# Patient Record
Sex: Female | Born: 1970
Health system: Southern US, Community
[De-identification: ages and names within clinical notes are randomized; demographics above are authoritative.]

## PROBLEM LIST (undated history)

## (undated) DIAGNOSIS — F32A Depression, unspecified: Secondary | ICD-10-CM

## (undated) DIAGNOSIS — J45909 Unspecified asthma, uncomplicated: Secondary | ICD-10-CM

## (undated) DIAGNOSIS — A599 Trichomoniasis, unspecified: Secondary | ICD-10-CM

## (undated) DIAGNOSIS — H40009 Preglaucoma, unspecified, unspecified eye: Secondary | ICD-10-CM

## (undated) DIAGNOSIS — E785 Hyperlipidemia, unspecified: Secondary | ICD-10-CM

## (undated) DIAGNOSIS — M1990 Unspecified osteoarthritis, unspecified site: Secondary | ICD-10-CM

## (undated) DIAGNOSIS — F419 Anxiety disorder, unspecified: Secondary | ICD-10-CM

## (undated) DIAGNOSIS — F329 Major depressive disorder, single episode, unspecified: Secondary | ICD-10-CM

## (undated) DIAGNOSIS — T7840XA Allergy, unspecified, initial encounter: Secondary | ICD-10-CM

## (undated) DIAGNOSIS — E119 Type 2 diabetes mellitus without complications: Secondary | ICD-10-CM

## (undated) DIAGNOSIS — K219 Gastro-esophageal reflux disease without esophagitis: Secondary | ICD-10-CM

## (undated) HISTORY — DX: Major depressive disorder, single episode, unspecified: F32.9

## (undated) HISTORY — DX: Anxiety disorder, unspecified: F41.9

## (undated) HISTORY — DX: Gastro-esophageal reflux disease without esophagitis: K21.9

## (undated) HISTORY — PX: UPPER GASTROINTESTINAL ENDOSCOPY: SHX188

## (undated) HISTORY — DX: Preglaucoma, unspecified, unspecified eye: H40.009

## (undated) HISTORY — DX: Hyperlipidemia, unspecified: E78.5

## (undated) HISTORY — DX: Depression, unspecified: F32.A

## (undated) HISTORY — DX: Unspecified asthma, uncomplicated: J45.909

## (undated) HISTORY — DX: Unspecified osteoarthritis, unspecified site: M19.90

## (undated) HISTORY — DX: Allergy, unspecified, initial encounter: T78.40XA

---

## 1997-11-22 ENCOUNTER — Other Ambulatory Visit: Payer: Self-pay

## 1998-06-04 ENCOUNTER — Inpatient Hospital Stay (HOSPITAL_COMMUNITY): Admission: AD | Admit: 1998-06-04 | Discharge: 1998-06-04 | Payer: Self-pay | Admitting: Gynecology

## 1998-08-19 ENCOUNTER — Emergency Department (HOSPITAL_COMMUNITY): Admission: EM | Admit: 1998-08-19 | Discharge: 1998-08-20 | Payer: Self-pay | Admitting: Emergency Medicine

## 1999-03-03 ENCOUNTER — Inpatient Hospital Stay (HOSPITAL_COMMUNITY): Admission: AD | Admit: 1999-03-03 | Discharge: 1999-03-03 | Payer: Self-pay | Admitting: Gynecology

## 1999-05-12 ENCOUNTER — Inpatient Hospital Stay (HOSPITAL_COMMUNITY): Admission: AD | Admit: 1999-05-12 | Discharge: 1999-05-12 | Payer: Self-pay | Admitting: Gynecology

## 1999-05-13 ENCOUNTER — Inpatient Hospital Stay (HOSPITAL_COMMUNITY): Admission: AD | Admit: 1999-05-13 | Discharge: 1999-05-13 | Payer: Self-pay | Admitting: Gynecology

## 1999-08-23 ENCOUNTER — Emergency Department (HOSPITAL_COMMUNITY): Admission: EM | Admit: 1999-08-23 | Discharge: 1999-08-23 | Payer: Self-pay | Admitting: *Deleted

## 1999-10-15 ENCOUNTER — Encounter: Payer: Self-pay | Admitting: Emergency Medicine

## 1999-10-15 ENCOUNTER — Emergency Department (HOSPITAL_COMMUNITY): Admission: EM | Admit: 1999-10-15 | Discharge: 1999-10-15 | Payer: Self-pay | Admitting: Emergency Medicine

## 2000-03-09 ENCOUNTER — Inpatient Hospital Stay (HOSPITAL_COMMUNITY): Admission: AD | Admit: 2000-03-09 | Discharge: 2000-03-09 | Payer: Self-pay | Admitting: Obstetrics & Gynecology

## 2000-05-02 ENCOUNTER — Encounter: Payer: Self-pay | Admitting: Emergency Medicine

## 2000-05-02 ENCOUNTER — Emergency Department (HOSPITAL_COMMUNITY): Admission: EM | Admit: 2000-05-02 | Discharge: 2000-05-02 | Payer: Self-pay | Admitting: Emergency Medicine

## 2000-06-22 ENCOUNTER — Inpatient Hospital Stay (HOSPITAL_COMMUNITY): Admission: AD | Admit: 2000-06-22 | Discharge: 2000-06-22 | Payer: Self-pay | Admitting: Obstetrics

## 2000-09-17 ENCOUNTER — Emergency Department (HOSPITAL_COMMUNITY): Admission: EM | Admit: 2000-09-17 | Discharge: 2000-09-17 | Payer: Self-pay | Admitting: *Deleted

## 2001-07-02 ENCOUNTER — Emergency Department (HOSPITAL_COMMUNITY): Admission: EM | Admit: 2001-07-02 | Discharge: 2001-07-02 | Payer: Self-pay | Admitting: *Deleted

## 2001-07-05 ENCOUNTER — Emergency Department (HOSPITAL_COMMUNITY): Admission: EM | Admit: 2001-07-05 | Discharge: 2001-07-05 | Payer: Self-pay | Admitting: Emergency Medicine

## 2001-07-05 ENCOUNTER — Encounter: Payer: Self-pay | Admitting: Emergency Medicine

## 2001-11-08 ENCOUNTER — Emergency Department (HOSPITAL_COMMUNITY): Admission: EM | Admit: 2001-11-08 | Discharge: 2001-11-08 | Payer: Self-pay | Admitting: Emergency Medicine

## 2001-11-12 ENCOUNTER — Emergency Department (HOSPITAL_COMMUNITY): Admission: EM | Admit: 2001-11-12 | Discharge: 2001-11-12 | Payer: Self-pay | Admitting: *Deleted

## 2001-11-12 ENCOUNTER — Encounter: Payer: Self-pay | Admitting: *Deleted

## 2002-01-04 ENCOUNTER — Inpatient Hospital Stay (HOSPITAL_COMMUNITY): Admission: AD | Admit: 2002-01-04 | Discharge: 2002-01-04 | Payer: Self-pay | Admitting: Obstetrics

## 2002-01-04 ENCOUNTER — Encounter: Payer: Self-pay | Admitting: Obstetrics

## 2002-07-14 ENCOUNTER — Emergency Department (HOSPITAL_COMMUNITY): Admission: EM | Admit: 2002-07-14 | Discharge: 2002-07-14 | Payer: Self-pay | Admitting: *Deleted

## 2002-07-16 ENCOUNTER — Emergency Department (HOSPITAL_COMMUNITY): Admission: EM | Admit: 2002-07-16 | Discharge: 2002-07-16 | Payer: Self-pay | Admitting: Emergency Medicine

## 2002-07-17 ENCOUNTER — Emergency Department (HOSPITAL_COMMUNITY): Admission: EM | Admit: 2002-07-17 | Discharge: 2002-07-17 | Payer: Self-pay | Admitting: Emergency Medicine

## 2002-07-19 ENCOUNTER — Emergency Department (HOSPITAL_COMMUNITY): Admission: EM | Admit: 2002-07-19 | Discharge: 2002-07-19 | Payer: Self-pay | Admitting: Emergency Medicine

## 2002-12-11 ENCOUNTER — Inpatient Hospital Stay (HOSPITAL_COMMUNITY): Admission: AD | Admit: 2002-12-11 | Discharge: 2002-12-11 | Payer: Self-pay | Admitting: *Deleted

## 2003-03-09 ENCOUNTER — Inpatient Hospital Stay (HOSPITAL_COMMUNITY): Admission: AD | Admit: 2003-03-09 | Discharge: 2003-03-09 | Payer: Self-pay | Admitting: Obstetrics and Gynecology

## 2003-04-26 ENCOUNTER — Ambulatory Visit (HOSPITAL_COMMUNITY): Admission: RE | Admit: 2003-04-26 | Discharge: 2003-04-26 | Payer: Self-pay | Admitting: Gastroenterology

## 2003-04-27 ENCOUNTER — Ambulatory Visit (HOSPITAL_COMMUNITY): Admission: RE | Admit: 2003-04-27 | Discharge: 2003-04-27 | Payer: Self-pay | Admitting: Gastroenterology

## 2003-04-27 ENCOUNTER — Encounter: Payer: Self-pay | Admitting: Gastroenterology

## 2003-10-17 ENCOUNTER — Emergency Department (HOSPITAL_COMMUNITY): Admission: EM | Admit: 2003-10-17 | Discharge: 2003-10-18 | Payer: Self-pay | Admitting: Emergency Medicine

## 2003-10-18 ENCOUNTER — Encounter: Payer: Self-pay | Admitting: Emergency Medicine

## 2003-10-27 ENCOUNTER — Ambulatory Visit (HOSPITAL_COMMUNITY): Admission: RE | Admit: 2003-10-27 | Discharge: 2003-10-27 | Payer: Self-pay | Admitting: Gastroenterology

## 2003-11-09 ENCOUNTER — Ambulatory Visit (HOSPITAL_COMMUNITY): Admission: RE | Admit: 2003-11-09 | Discharge: 2003-11-09 | Payer: Self-pay | Admitting: Gastroenterology

## 2004-04-17 ENCOUNTER — Emergency Department (HOSPITAL_COMMUNITY): Admission: EM | Admit: 2004-04-17 | Discharge: 2004-04-17 | Payer: Self-pay | Admitting: Emergency Medicine

## 2004-08-21 ENCOUNTER — Emergency Department (HOSPITAL_COMMUNITY): Admission: EM | Admit: 2004-08-21 | Discharge: 2004-08-21 | Payer: Self-pay | Admitting: Emergency Medicine

## 2004-12-23 HISTORY — PX: VAGINAL HYSTERECTOMY: SUR661

## 2005-04-15 ENCOUNTER — Emergency Department (HOSPITAL_COMMUNITY): Admission: EM | Admit: 2005-04-15 | Discharge: 2005-04-15 | Payer: Self-pay | Admitting: Emergency Medicine

## 2005-07-04 ENCOUNTER — Inpatient Hospital Stay (HOSPITAL_COMMUNITY): Admission: AD | Admit: 2005-07-04 | Discharge: 2005-07-04 | Payer: Self-pay | Admitting: Obstetrics & Gynecology

## 2005-07-11 ENCOUNTER — Ambulatory Visit: Payer: Self-pay | Admitting: Family Medicine

## 2005-08-01 ENCOUNTER — Ambulatory Visit: Payer: Self-pay | Admitting: Obstetrics & Gynecology

## 2005-08-01 ENCOUNTER — Other Ambulatory Visit: Admission: RE | Admit: 2005-08-01 | Discharge: 2005-08-01 | Payer: Self-pay | Admitting: Obstetrics and Gynecology

## 2005-08-01 ENCOUNTER — Encounter (INDEPENDENT_AMBULATORY_CARE_PROVIDER_SITE_OTHER): Payer: Self-pay | Admitting: Specialist

## 2005-09-16 ENCOUNTER — Observation Stay (HOSPITAL_COMMUNITY): Admission: RE | Admit: 2005-09-16 | Discharge: 2005-09-18 | Payer: Self-pay | Admitting: Family Medicine

## 2005-09-16 ENCOUNTER — Ambulatory Visit: Payer: Self-pay | Admitting: Family Medicine

## 2005-09-16 ENCOUNTER — Encounter (INDEPENDENT_AMBULATORY_CARE_PROVIDER_SITE_OTHER): Payer: Self-pay | Admitting: *Deleted

## 2005-09-25 ENCOUNTER — Inpatient Hospital Stay (HOSPITAL_COMMUNITY): Admission: AD | Admit: 2005-09-25 | Discharge: 2005-09-27 | Payer: Self-pay | Admitting: Obstetrics and Gynecology

## 2005-09-25 ENCOUNTER — Ambulatory Visit: Payer: Self-pay | Admitting: Obstetrics and Gynecology

## 2005-10-04 ENCOUNTER — Ambulatory Visit: Payer: Self-pay | Admitting: Family Medicine

## 2005-10-11 ENCOUNTER — Ambulatory Visit: Payer: Self-pay | Admitting: *Deleted

## 2005-10-25 ENCOUNTER — Ambulatory Visit: Payer: Self-pay | Admitting: Family Medicine

## 2005-11-22 ENCOUNTER — Ambulatory Visit: Payer: Self-pay | Admitting: Family Medicine

## 2006-01-07 ENCOUNTER — Ambulatory Visit: Payer: Self-pay | Admitting: Obstetrics and Gynecology

## 2006-01-30 ENCOUNTER — Ambulatory Visit: Payer: Self-pay | Admitting: Family Medicine

## 2006-07-10 ENCOUNTER — Emergency Department (HOSPITAL_COMMUNITY): Admission: EM | Admit: 2006-07-10 | Discharge: 2006-07-10 | Payer: Self-pay | Admitting: Emergency Medicine

## 2006-09-10 ENCOUNTER — Emergency Department (HOSPITAL_COMMUNITY): Admission: EM | Admit: 2006-09-10 | Discharge: 2006-09-10 | Payer: Self-pay | Admitting: Emergency Medicine

## 2006-09-11 ENCOUNTER — Ambulatory Visit: Payer: Self-pay | Admitting: Obstetrics and Gynecology

## 2006-09-15 ENCOUNTER — Emergency Department (HOSPITAL_COMMUNITY): Admission: EM | Admit: 2006-09-15 | Discharge: 2006-09-15 | Payer: Self-pay | Admitting: Family Medicine

## 2006-09-25 ENCOUNTER — Ambulatory Visit (HOSPITAL_COMMUNITY): Admission: RE | Admit: 2006-09-25 | Discharge: 2006-09-25 | Payer: Self-pay | Admitting: Gastroenterology

## 2006-10-15 ENCOUNTER — Ambulatory Visit (HOSPITAL_COMMUNITY): Admission: RE | Admit: 2006-10-15 | Discharge: 2006-10-15 | Payer: Self-pay | Admitting: Gastroenterology

## 2007-01-01 ENCOUNTER — Emergency Department (HOSPITAL_COMMUNITY): Admission: EM | Admit: 2007-01-01 | Discharge: 2007-01-01 | Payer: Self-pay | Admitting: Emergency Medicine

## 2007-04-09 ENCOUNTER — Emergency Department (HOSPITAL_COMMUNITY): Admission: EM | Admit: 2007-04-09 | Discharge: 2007-04-09 | Payer: Self-pay | Admitting: Emergency Medicine

## 2007-04-15 ENCOUNTER — Ambulatory Visit: Payer: Self-pay | Admitting: Gynecology

## 2007-04-15 ENCOUNTER — Encounter (INDEPENDENT_AMBULATORY_CARE_PROVIDER_SITE_OTHER): Payer: Self-pay | Admitting: Gynecology

## 2007-04-24 ENCOUNTER — Emergency Department (HOSPITAL_COMMUNITY): Admission: EM | Admit: 2007-04-24 | Discharge: 2007-04-24 | Payer: Self-pay | Admitting: Emergency Medicine

## 2007-05-28 ENCOUNTER — Inpatient Hospital Stay (HOSPITAL_COMMUNITY): Admission: AD | Admit: 2007-05-28 | Discharge: 2007-05-28 | Payer: Self-pay | Admitting: Obstetrics and Gynecology

## 2007-06-03 ENCOUNTER — Ambulatory Visit: Payer: Self-pay | Admitting: Gynecology

## 2007-07-13 ENCOUNTER — Emergency Department (HOSPITAL_COMMUNITY): Admission: EM | Admit: 2007-07-13 | Discharge: 2007-07-13 | Payer: Self-pay | Admitting: Emergency Medicine

## 2007-08-03 ENCOUNTER — Emergency Department (HOSPITAL_COMMUNITY): Admission: EM | Admit: 2007-08-03 | Discharge: 2007-08-03 | Payer: Self-pay | Admitting: Emergency Medicine

## 2008-01-01 ENCOUNTER — Emergency Department (HOSPITAL_COMMUNITY): Admission: EM | Admit: 2008-01-01 | Discharge: 2008-01-01 | Payer: Self-pay | Admitting: Emergency Medicine

## 2008-06-08 ENCOUNTER — Emergency Department (HOSPITAL_COMMUNITY): Admission: EM | Admit: 2008-06-08 | Discharge: 2008-06-08 | Payer: Self-pay | Admitting: Emergency Medicine

## 2008-06-09 ENCOUNTER — Encounter: Payer: Self-pay | Admitting: Obstetrics & Gynecology

## 2008-06-09 ENCOUNTER — Ambulatory Visit: Payer: Self-pay | Admitting: Obstetrics & Gynecology

## 2008-06-13 ENCOUNTER — Ambulatory Visit (HOSPITAL_COMMUNITY): Admission: RE | Admit: 2008-06-13 | Discharge: 2008-06-13 | Payer: Self-pay | Admitting: Family Medicine

## 2008-07-13 ENCOUNTER — Ambulatory Visit: Payer: Self-pay | Admitting: Gynecology

## 2008-09-16 ENCOUNTER — Emergency Department (HOSPITAL_COMMUNITY): Admission: EM | Admit: 2008-09-16 | Discharge: 2008-09-16 | Payer: Self-pay | Admitting: Emergency Medicine

## 2009-06-16 ENCOUNTER — Encounter: Payer: Self-pay | Admitting: Obstetrics & Gynecology

## 2009-06-16 ENCOUNTER — Ambulatory Visit: Payer: Self-pay | Admitting: Obstetrics & Gynecology

## 2009-06-16 LAB — CONVERTED CEMR LAB: HCV Ab: NEGATIVE

## 2009-06-17 ENCOUNTER — Encounter: Payer: Self-pay | Admitting: Obstetrics & Gynecology

## 2009-06-17 LAB — CONVERTED CEMR LAB
Trich, Wet Prep: NONE SEEN
Yeast Wet Prep HPF POC: NONE SEEN

## 2009-10-05 ENCOUNTER — Emergency Department (HOSPITAL_COMMUNITY): Admission: EM | Admit: 2009-10-05 | Discharge: 2009-10-05 | Payer: Self-pay | Admitting: Emergency Medicine

## 2009-10-11 ENCOUNTER — Emergency Department (HOSPITAL_COMMUNITY): Admission: EM | Admit: 2009-10-11 | Discharge: 2009-10-11 | Payer: Self-pay | Admitting: Emergency Medicine

## 2009-10-20 ENCOUNTER — Ambulatory Visit: Payer: Self-pay | Admitting: Obstetrics and Gynecology

## 2009-11-01 ENCOUNTER — Ambulatory Visit: Payer: Self-pay | Admitting: Obstetrics and Gynecology

## 2009-11-08 ENCOUNTER — Emergency Department (HOSPITAL_COMMUNITY): Admission: EM | Admit: 2009-11-08 | Discharge: 2009-11-09 | Payer: Self-pay | Admitting: Emergency Medicine

## 2009-11-12 ENCOUNTER — Emergency Department (HOSPITAL_COMMUNITY): Admission: EM | Admit: 2009-11-12 | Discharge: 2009-11-12 | Payer: Self-pay | Admitting: Emergency Medicine

## 2009-11-17 ENCOUNTER — Encounter (HOSPITAL_BASED_OUTPATIENT_CLINIC_OR_DEPARTMENT_OTHER): Admission: RE | Admit: 2009-11-17 | Discharge: 2009-12-18 | Payer: Self-pay | Admitting: General Surgery

## 2010-04-06 ENCOUNTER — Emergency Department (HOSPITAL_COMMUNITY): Admission: EM | Admit: 2010-04-06 | Discharge: 2010-04-06 | Payer: Self-pay | Admitting: Emergency Medicine

## 2010-04-07 ENCOUNTER — Emergency Department (HOSPITAL_COMMUNITY): Admission: EM | Admit: 2010-04-07 | Discharge: 2010-04-07 | Payer: Self-pay | Admitting: Emergency Medicine

## 2010-05-01 ENCOUNTER — Ambulatory Visit (HOSPITAL_COMMUNITY): Admission: RE | Admit: 2010-05-01 | Discharge: 2010-05-01 | Payer: Self-pay | Admitting: Family Medicine

## 2010-07-15 ENCOUNTER — Emergency Department (HOSPITAL_COMMUNITY): Admission: EM | Admit: 2010-07-15 | Discharge: 2010-07-15 | Payer: Self-pay | Admitting: Emergency Medicine

## 2010-07-18 ENCOUNTER — Emergency Department (HOSPITAL_COMMUNITY): Admission: EM | Admit: 2010-07-18 | Discharge: 2010-07-18 | Payer: Self-pay | Admitting: Emergency Medicine

## 2010-08-13 ENCOUNTER — Emergency Department (HOSPITAL_COMMUNITY): Admission: EM | Admit: 2010-08-13 | Discharge: 2010-08-13 | Payer: Self-pay | Admitting: Emergency Medicine

## 2010-08-20 ENCOUNTER — Emergency Department (HOSPITAL_COMMUNITY): Admission: EM | Admit: 2010-08-20 | Discharge: 2010-08-20 | Payer: Self-pay | Admitting: Emergency Medicine

## 2010-08-23 ENCOUNTER — Emergency Department (HOSPITAL_COMMUNITY): Admission: EM | Admit: 2010-08-23 | Discharge: 2010-08-23 | Payer: Self-pay | Admitting: Emergency Medicine

## 2010-10-02 ENCOUNTER — Emergency Department (HOSPITAL_COMMUNITY): Admission: EM | Admit: 2010-10-02 | Discharge: 2010-10-02 | Payer: Self-pay | Admitting: Emergency Medicine

## 2011-02-21 ENCOUNTER — Emergency Department (HOSPITAL_COMMUNITY)
Admission: EM | Admit: 2011-02-21 | Discharge: 2011-02-21 | Disposition: A | Discharge status: Home / Self Care | Payer: Medicaid Other | Attending: Emergency Medicine | Admitting: Emergency Medicine

## 2011-02-21 DIAGNOSIS — R42 Dizziness and giddiness: Secondary | ICD-10-CM | POA: Insufficient documentation

## 2011-02-21 DIAGNOSIS — F3289 Other specified depressive episodes: Secondary | ICD-10-CM | POA: Insufficient documentation

## 2011-02-21 DIAGNOSIS — F329 Major depressive disorder, single episode, unspecified: Secondary | ICD-10-CM | POA: Insufficient documentation

## 2011-02-21 DIAGNOSIS — R079 Chest pain, unspecified: Secondary | ICD-10-CM | POA: Insufficient documentation

## 2011-02-21 DIAGNOSIS — L0231 Cutaneous abscess of buttock: Secondary | ICD-10-CM | POA: Insufficient documentation

## 2011-02-21 LAB — POCT I-STAT, CHEM 8
BUN: 5 mg/dL — ABNORMAL LOW (ref 6–23)
Calcium, Ion: 1.14 mmol/L (ref 1.12–1.32)
Chloride: 103 meq/L (ref 96–112)
Creatinine, Ser: 1 mg/dL (ref 0.4–1.2)
Glucose, Bld: 83 mg/dL (ref 70–99)
HCT: 43 % (ref 36.0–46.0)
Hemoglobin: 14.6 g/dL (ref 12.0–15.0)
Potassium: 3.5 mEq/L (ref 3.5–5.1)
Sodium: 142 mEq/L (ref 135–145)
TCO2: 26 mmol/L (ref 0–100)

## 2011-02-21 LAB — POCT CARDIAC MARKERS
CKMB, poc: <1 ng/mL — ABNORMAL LOW (ref 1.0–8.0)
Myoglobin, poc: 63.5 ng/mL (ref 12–200)
Troponin i, poc: <0.05 ng/mL (ref 0.00–0.09)

## 2011-03-01 ENCOUNTER — Emergency Department (HOSPITAL_COMMUNITY)
Admission: EM | Admit: 2011-03-01 | Discharge: 2011-03-01 | Disposition: A | Discharge status: Home / Self Care | Payer: Medicaid Other | Attending: Emergency Medicine | Admitting: Emergency Medicine

## 2011-03-01 ENCOUNTER — Emergency Department (HOSPITAL_COMMUNITY): Payer: Medicaid Other

## 2011-03-01 DIAGNOSIS — F3289 Other specified depressive episodes: Secondary | ICD-10-CM | POA: Insufficient documentation

## 2011-03-01 DIAGNOSIS — R112 Nausea with vomiting, unspecified: Secondary | ICD-10-CM | POA: Insufficient documentation

## 2011-03-01 DIAGNOSIS — F329 Major depressive disorder, single episode, unspecified: Secondary | ICD-10-CM | POA: Insufficient documentation

## 2011-03-01 DIAGNOSIS — R509 Fever, unspecified: Secondary | ICD-10-CM | POA: Insufficient documentation

## 2011-03-01 DIAGNOSIS — R197 Diarrhea, unspecified: Secondary | ICD-10-CM | POA: Insufficient documentation

## 2011-03-01 LAB — CBC
MCH: 28.6 pg (ref 26.0–34.0)
MCV: 86.6 fL (ref 78.0–100.0)
Platelets: 396 10*3/uL (ref 150–400)
RBC: 4.86 MIL/uL (ref 3.87–5.11)
RDW: 13 % (ref 11.5–15.5)

## 2011-03-01 LAB — URINE MICROSCOPIC-ADD ON

## 2011-03-01 LAB — DIFFERENTIAL
Basophils Relative: 0 % (ref 0–1)
Eosinophils Absolute: 0 10*3/uL (ref 0.0–0.7)
Monocytes Absolute: 0.4 10*3/uL (ref 0.1–1.0)
Monocytes Relative: 5 % (ref 3–12)
Neutrophils Relative %: 84 % — ABNORMAL HIGH (ref 43–77)

## 2011-03-01 LAB — URINALYSIS, ROUTINE W REFLEX MICROSCOPIC
Glucose, UA: NEGATIVE mg/dL
Leukocytes, UA: NEGATIVE
Nitrite: NEGATIVE
Protein, ur: NEGATIVE mg/dL
Urobilinogen, UA: 0.2 mg/dL (ref 0.0–1.0)

## 2011-03-01 LAB — COMPREHENSIVE METABOLIC PANEL
Albumin: 3.7 g/dL (ref 3.5–5.2)
BUN: 6 mg/dL (ref 6–23)
Chloride: 105 mEq/L (ref 96–112)
Creatinine, Ser: 0.96 mg/dL (ref 0.4–1.2)
Glucose, Bld: 98 mg/dL (ref 70–99)
Total Bilirubin: 0.7 mg/dL (ref 0.3–1.2)

## 2011-03-01 LAB — LIPASE, BLOOD: Lipase: 22 U/L (ref 11–59)

## 2011-03-07 LAB — CBC
HCT: 37.9 % (ref 36.0–46.0)
Hemoglobin: 12.8 g/dL (ref 12.0–15.0)
MCH: 29.7 pg (ref 26.0–34.0)
MCHC: 33.8 g/dL (ref 30.0–36.0)
MCV: 87.9 fL (ref 78.0–100.0)
RDW: 12.8 % (ref 11.5–15.5)

## 2011-03-07 LAB — BASIC METABOLIC PANEL
BUN: 5 mg/dL — ABNORMAL LOW (ref 6–23)
CO2: 27 mEq/L (ref 19–32)
Chloride: 107 mEq/L (ref 96–112)
Glucose, Bld: 89 mg/dL (ref 70–99)
Potassium: 3.7 mEq/L (ref 3.5–5.1)
Sodium: 141 mEq/L (ref 135–145)

## 2011-03-09 LAB — URINALYSIS, ROUTINE W REFLEX MICROSCOPIC
Bilirubin Urine: NEGATIVE
Hgb urine dipstick: NEGATIVE
Nitrite: NEGATIVE
Specific Gravity, Urine: 1.023 (ref 1.005–1.030)
Urobilinogen, UA: 1 mg/dL (ref 0.0–1.0)
pH: 7.5 (ref 5.0–8.0)

## 2011-03-09 LAB — POCT I-STAT, CHEM 8
Chloride: 104 mEq/L (ref 96–112)
Creatinine, Ser: 0.9 mg/dL (ref 0.4–1.2)
Glucose, Bld: 93 mg/dL (ref 70–99)
HCT: 43 % (ref 36.0–46.0)
Hemoglobin: 14.6 g/dL (ref 12.0–15.0)
Potassium: 3.1 mEq/L — ABNORMAL LOW (ref 3.5–5.1)
Sodium: 144 mEq/L (ref 135–145)

## 2011-03-09 LAB — URINE MICROSCOPIC-ADD ON

## 2011-03-12 LAB — CBC
HCT: 39.8 % (ref 36.0–46.0)
HCT: 41.2 % (ref 36.0–46.0)
Hemoglobin: 14 g/dL (ref 12.0–15.0)
MCHC: 33.9 g/dL (ref 30.0–36.0)
MCHC: 34.4 g/dL (ref 30.0–36.0)
MCV: 90.4 fL (ref 78.0–100.0)
MCV: 91.1 fL (ref 78.0–100.0)
Platelets: 308 K/uL (ref 150–400)
Platelets: 333 10*3/uL (ref 150–400)
RBC: 4.52 MIL/uL (ref 3.87–5.11)
RDW: 13.5 % (ref 11.5–15.5)
RDW: 13.8 % (ref 11.5–15.5)
WBC: 8.1 K/uL (ref 4.0–10.5)

## 2011-03-12 LAB — URINALYSIS, ROUTINE W REFLEX MICROSCOPIC
Glucose, UA: NEGATIVE mg/dL
Hgb urine dipstick: NEGATIVE
Ketones, ur: NEGATIVE mg/dL
Protein, ur: NEGATIVE mg/dL
Urobilinogen, UA: 0.2 mg/dL (ref 0.0–1.0)

## 2011-03-12 LAB — HEPATIC FUNCTION PANEL
ALT: 12 U/L (ref 0–35)
AST: 30 U/L (ref 0–37)
Albumin: 4 g/dL (ref 3.5–5.2)
Alkaline Phosphatase: 58 U/L (ref 39–117)
Bilirubin, Direct: 0.4 mg/dL — ABNORMAL HIGH (ref 0.0–0.3)
Indirect Bilirubin: 0.9 mg/dL (ref 0.3–0.9)
Total Bilirubin: 1.3 mg/dL — ABNORMAL HIGH (ref 0.3–1.2)
Total Protein: 7.4 g/dL (ref 6.0–8.3)

## 2011-03-12 LAB — DIFFERENTIAL
Basophils Absolute: 0 10*3/uL (ref 0.0–0.1)
Basophils Absolute: 0.1 K/uL (ref 0.0–0.1)
Basophils Relative: 1 % (ref 0–1)
Eosinophils Absolute: 0.2 K/uL (ref 0.0–0.7)
Eosinophils Relative: 2 % (ref 0–5)
Lymphocytes Relative: 25 % (ref 12–46)
Lymphocytes Relative: 38 % (ref 12–46)
Lymphs Abs: 1.5 10*3/uL (ref 0.7–4.0)
Lymphs Abs: 3 K/uL (ref 0.7–4.0)
Monocytes Absolute: 0.4 10*3/uL (ref 0.1–1.0)
Monocytes Absolute: 0.6 10*3/uL (ref 0.1–1.0)
Monocytes Relative: 7 % (ref 3–12)
Monocytes Relative: 7 % (ref 3–12)
Neutro Abs: 4.1 10*3/uL (ref 1.7–7.7)
Neutro Abs: 4.2 10*3/uL (ref 1.7–7.7)
Neutrophils Relative %: 51 % (ref 43–77)

## 2011-03-12 LAB — COMPREHENSIVE METABOLIC PANEL
AST: 21 U/L (ref 0–37)
Albumin: 4 g/dL (ref 3.5–5.2)
BUN: 8 mg/dL (ref 6–23)
Calcium: 9 mg/dL (ref 8.4–10.5)
Creatinine, Ser: 0.88 mg/dL (ref 0.4–1.2)
GFR calc Af Amer: >60 mL/min (ref >60)
Total Bilirubin: 0.7 mg/dL (ref 0.3–1.2)
Total Protein: 7.3 g/dL (ref 6.0–8.3)

## 2011-03-14 ENCOUNTER — Ambulatory Visit (INDEPENDENT_AMBULATORY_CARE_PROVIDER_SITE_OTHER): Payer: Medicaid Other | Admitting: Family

## 2011-03-14 ENCOUNTER — Encounter: Payer: Self-pay | Admitting: Physician Assistant

## 2011-03-14 DIAGNOSIS — N760 Acute vaginitis: Secondary | ICD-10-CM

## 2011-03-14 DIAGNOSIS — N951 Menopausal and female climacteric states: Secondary | ICD-10-CM

## 2011-03-14 LAB — CONVERTED CEMR LAB: Trich, Wet Prep: NONE SEEN

## 2011-03-15 NOTE — Progress Notes (Signed)
NAME:  Whitney Benton, Whitney Benton NO.:  [id number]  MEDICAL RECORD NO.:  [id number]           PATIENT TYPE:  A  LOCATION:  [facility]                   FACILITY:  [facility]  PHYSICIAN:  Maylon Cos, CNM    DATE OF BIRTH:  [birth year]  DATE OF SERVICE:  03/14/2011                                 CLINIC NOTE  REASON FOR TODAY'S VISIT:  Annual exam with Pap smear.  HISTORY OF PRESENT ILLNESS:  The patient is a 40 year old African American female, who is status post hysterectomy for problematic fibroids in 2006.  Her pathology from her TVH was benign.  She is requesting to have a mammogram, now that she is age 58, but denies any symptoms.  No pelvic pain, abnormal bleeding.  The patient's medical history is unchanged since her last visit.  Her primary care provider is Dr. Loleta Chance.  She is currently being treated for depression with Cymbalta. She has been on that medication since 2009.  The patient does complain of decreased libido and vaginal dryness.  PHYSICAL EXAMINATION:  GENERAL:  On examination, Whitney Benton is a pleasant 11- year-old [place] American female, who appears to be slightly younger than her stated age. HEENT:  Grossly normal without thyromegaly. VITAL SIGNS:  Vital signs are stable.  Her temperature is 97.2, her pulse is 67, her blood pressure is 115/80, and her weight today is 183. ABDOMEN:  Soft and nontender with no masses.  No hepatosplenomegaly. GENITALIA:  Mucous membranes are pink without lesions externally. Internal mucous membranes are pink without lesion.  She has irregular rugae with moderate tone.  Vaginal cuff is intact.  There is a scant amount of creamy malodorous discharge.  Bimanual exam reveals an absent uterus and bilateral ovaries nonpalpable and nontender. EXTREMITIES:  Lower extremities are without edema and both pedal pulses are equal.  ASSESSMENT: 1. Well-woman exam. 2. Decreased libido. 3. Questionable perimenopausal symptoms.  PLAN: 1. Wet  prep and cultures were sent to assess for infection. 2. Presumptive treatment of bacterial vaginosis, treated with 500 mg     Flagyl b.i.d. for 7 days. 3. I have discussed at length that patient's age and possible     decreasing functioning of her ovaries and relation to her     perimenopausal symptoms of vaginal dryness and decreased libido;     however, we have also discussed that she is on medication Cymbalta     that is likely to decrease sexual desire as well.  We will assess     lab work for menopause today.  Health care maintenance, routine     screening mammogram is ordered and scheduled for today.  FOLLOWUP:  The patient should follow up in 2 weeks to discuss her results from her lab work and possible need for hormone replacement.  If the patient does not appear to be in menopause and needing hormone replacement, we will consider testosterone cream for decreased libido.          ______________________________ Maylon Cos, CNM    SS/MEDQ  D:  03/14/2011  T:  03/15/2011  Job:  [id number]

## 2011-03-27 LAB — POCT URINALYSIS DIP (DEVICE)
Bilirubin Urine: NEGATIVE
Glucose, UA: NEGATIVE mg/dL
Nitrite: NEGATIVE

## 2011-03-28 LAB — POCT URINALYSIS DIP (DEVICE)
Bilirubin Urine: NEGATIVE
Glucose, UA: NEGATIVE mg/dL
Ketones, ur: NEGATIVE mg/dL

## 2011-04-03 ENCOUNTER — Ambulatory Visit (INDEPENDENT_AMBULATORY_CARE_PROVIDER_SITE_OTHER): Payer: Medicaid Other | Admitting: Obstetrics and Gynecology

## 2011-04-03 DIAGNOSIS — Z79890 Hormone replacement therapy (postmenopausal): Secondary | ICD-10-CM

## 2011-04-04 NOTE — Progress Notes (Signed)
NAME:  Whitney Benton, Whitney Benton NO.:  [id number]  MEDICAL RECORD NO.:  [id number]           PATIENT TYPE:  LOCATION:  [facility]                     FACILITY:  PHYSICIAN:  Scheryl Darter, MD       DATE OF BIRTH:  [birth year]  DATE OF SERVICE:                                 CLINIC NOTE  The patient comes for results of her lab work.  She was treated for bacterial vaginosis.  Gonorrhea and Chlamydia were negative and FSH was modestly elevated at 9.6 and an LH was 11.  She says that she has vasomotor symptoms, as well as decreased libido, and vaginal dryness. She is status post hysterectomy.  I offered hormone replacement therapy. I gave a prescription for Estratest H.S. one p.o. daily.  She will report if her symptoms do not improve.     Scheryl Darter, MD    JA/MEDQ  D:  04/03/2011  T:  04/04/2011  Job:  [id number]

## 2011-05-02 ENCOUNTER — Other Ambulatory Visit: Payer: Self-pay | Admitting: Family Medicine

## 2011-05-02 DIAGNOSIS — Z1231 Encounter for screening mammogram for malignant neoplasm of breast: Secondary | ICD-10-CM

## 2011-05-07 NOTE — Group Therapy Note (Signed)
NAME:  Whitney Benton, Whitney Benton NO.:  [id number]   MEDICAL RECORD NO.:  [id number]          PATIENT TYPE:  WOC   LOCATION[facility]  [facility]                   FACILITY:  [facility]   PHYSICIAN:  Jaynie Collins, MD     DATE OF BIRTH:  1971-10-08   DATE OF SERVICE:  06/16/2009                                  CLINIC NOTE   The patient is a 40 year old, gravida 6, para 3-0-3-3, who comes in  today for her annual examination.  The patient has no gynecologic  complaints.  She is status post total vaginal hysterectomy in 2006 and  denies any abnormal bleeding or any other concerns.   PAST OBSTETRIC AND GYNECOLOGY HISTORY:  The patient has had 3 cesarean  sections, 3 miscarriages.  She underwent total vaginal hysterectomy in  2006 for fibroids, anemia, and menometrorrhagia.  She has no history of  cervical dysplasia.  However, during her last examination, her vaginal  cuff appeared concerning and vaginal Pap smear was done which was  negative for any intraepithelial lesion or malignancy.  The patient  denies any sexually transmitted infections.  She is sexually active with  1 female partner.  She is interested in an STI screen today.   PAST MEDICAL HISTORY:  Obesity, the patient is a smoker.   PAST SURGICAL HISTORY:  Total vaginal hysterectomy.  Three cesarean  sections and a dilation and curettage.   SOCIAL HISTORY:  The patient smokes a 30-pack a day of cigarettes and  has smoked for 4 years.  She drinks 1-2 alcoholic drinks a month and  denies any illicit drugs.  She denies any physical or sexual abuse  currently.   FAMILY HISTORY:  Notable for diabetes and high blood pressure.  Negative  for breast, gynecologic, and colon malignancies.   MEDICATIONS:  1. Cyclobenzaprine 5 mg as needed.  2. DeVita as needed for headaches.   ALLERGIES:  ASPIRIN which causes hives.   REVIEW OF SYSTEMS:  Entirely negative and was mentioned in the history  of present illness.   PHYSICAL  EXAMINATION:  VITAL SIGNS:  Temperature 97, pulse 74, blood  pressure 118/72, weight 184 pounds, height 5 feet 2 inches.  GENERAL:  No apparent distress.  NECK:  Supple.  No masses.  Normal thyroid.  HEENT:  Normocephalic, atraumatic.  LUNGS:  Clear to auscultation bilaterally.  HEART:  Regular rate and rhythm.  BREASTS:  Symmetric in size, nontender, abnormal masses.  SKIN:  Skin changes or nipple drainage noted.  No lymphadenopathy.  ABDOMEN:  Obese, nontender, no abnormal masses palpated.  EXTREMITIES:  No clubbing, cyanosis, or edema.  Nontender.  PELVIC:  Normal external female genitalia.  Pink and well rugated  vagina.  Small amount of thin white discharge seen with foul odor.  A  sample was taken for wet prep.  No tenderness on examination.  BIMANUAL:  Adnexa were unable to be palpated secondary to habitus.   ASSESSMENT AND PLAN:  The patient is a 40 year old gravida 6, para 3-0-3-  3, who is here for annual exam, status post a total vaginal  hysterectomy.  The patient is doing  well.  She is interested in STI  screen, and we will send a urine sample for GC, Chlamydia, and wet prep.  We will test for Trichomonas, and she will have a serum tested for HIV,  hepatitis, and for RPR.  The patient was told to come back to the  Gynecologic Clinic for any further gynecologic concerns or in a year for  annual examination.           ______________________________  Jaynie Collins, MD     UA/MEDQ  D:  06/16/2009  T:  06/17/2009  Job:  [id number]

## 2011-05-07 NOTE — Group Therapy Note (Signed)
NAME:  [name], [name] NO.:  [id number]   MEDICAL RECORD NO.:  [id number]          PATIENT TYPE:  WOC   LOCATION:  [facility]                   FACILITY:  [facility]   PHYSICIAN:  Allie Bossier, MD        DATE OF BIRTH:  10/22/1971   DATE OF SERVICE:  06/09/2008                                  CLINIC NOTE   Whitney Benton is a 40 year old divorced African American G6, P3, A3 who has 3  teenagers who comes in for annual exam.  Her complaint today is that of  vaginal dryness for approximately a year as well as left lower quadrant  pain for approximately a month.   PAST MEDICAL HISTORY:  She is a smoker and she is overweight.   PAST SURGICAL HISTORY:  She has had a TVH, 3 cesareans and D&C.   FAMILY HISTORY:  Negative for breast, GYN, and colon malignancies.   ALLERGIES:  ASPIRIN.   SOCIAL HISTORY:  She smokes a third of a pack a day of cigarettes for  approximately 3 years.  She reports social alcohol and denies drugs.   REVIEW OF SYSTEMS:  Her last Pap smear was done in April 2008.   PHYSICAL EXAMINATION:  VITAL SIGNS:  Weight 176 pounds, height 5'2.  Blood pressure 120/87, pulse 83.  HEENT:  Normal.  HEART:  Regular rate rhythm.  BREAST:  Normal bilaterally.  ABDOMEN:  Exam is benign.  EXTERNAL GENITALIA:  Normal.  Her vaginal cuff is diffusely white with  excessive rugae.  This is not inconsistent with HPV infection.   ASSESSMENT/PLAN:  1. Annual exam.  Although ordinarily she would not need a Pap smear      after a hysterectomy, due to the abnormal appearance of her vaginal      cuff, I have done a Pap smear.  I have recommended self-breast and      self-vulvar exams monthly.  2. Vaginal dryness.  I will check an FSH.  3. Left lower quadrant pain.  I will check a GYN ultrasound.     Allie Bossier, MD    MCD/MEDQ  D:  06/09/2008  T:  06/09/2008  Job:  [id number]

## 2011-05-07 NOTE — Group Therapy Note (Signed)
NAME:  DEBI, COUSIN NO.:  [id number]   MEDICAL RECORD NO.:  [id number]          PATIENT TYPE:  WOC   LOCATION[facility]                   FACILITY:  [facility]   PHYSICIAN:  Maylon Cos, CNM    DATE OF BIRTH:  06/13/1971   DATE OF SERVICE:                                  CLINIC NOTE   The patient is being seen today July 13, 2008 in the GYN Clinic at  [facility].  She presents for a followup for lab results and pelvic  ultrasound that she had on June 22, secondary to some left-sided pelvic  pain.  She also had complained of rash underneath both of her breasts.  The patient currently has no complaints other than the rash.  She is not  experiencing any lower abdominal pain, which she states resolved  approximately 2 weeks after her visit in June.   PHYSICAL EXAMINATION:  GENERAL:  On examination today, the patient is in  no apparent distress.  VITAL SIGNS:  Stable.  Her temperature is 97.2, pulse is 71.  Her blood  pressure is 113/81.  Her weight is 175.8.  Her height is 5 feet 2  inches.   MEDICATION:  She is currently taking Cymbalta as her only medication.   ALLERGIES:  ASPIRIN.   Her exam today is problem-focused to her breast.  She has bilateral,  what appears to be a, candidal infection underneath both her left and  right breast, which is contained within the fold approximately 6 cm  underneath the posterior aspect of the breast onto her abdomen where her  breasts lay.   ASSESSMENT AND PLAN:  1. Breast candidal infection.  The patient was given a prescription      for Diflucan 300 mg p.o. x3 days and also for Nystatin powder to      use twice daily under her breasts.  She was also educated      concerning using mild soap, staying away from antibacterial soaps,      lotions in this area until completely cleared.  2. Followup for her ultrasound report.  The patient's pelvic      ultrasound on June 22, showed small ovarian cyst on her right  side      and some simple-appearing cysts which are currently benign.  The      patient also had a FSH result of 5.6 that was drawn on June 19.      This again was reviewed as being a normal value.  She does not      appear to be menopausal.  She is status post a partial      hysterectomy, still has her ovaries with continued complaints of      vaginal dryness.  For her vaginal dryness, we discussed the use of      over-the-counter Replens q. 2-3 days, but reassurance was given      that hormone levels do not indicate that she is currently      menopausal.  The patient is instructed to follow up p.r.n. for      problems.  Otherwise,  her next scheduled appointment will be after      June 18 of next year for an annual exam and physical.           ______________________________  Maylon Cos, CNM    SS/MEDQ  D:  07/13/2008  T:  07/14/2008  Job:  [id number]

## 2011-05-10 NOTE — Op Note (Signed)
   NAME:  Whitney Benton, Whitney Benton                           ACCOUNT NO.:  [id number]   MEDICAL RECORD NO.:  [id number]                   PATIENT TYPE:  AMB   LOCATION:  ENDO                                 FACILITY:  [facility]   PHYSICIAN:  John C. Madilyn Fireman, M.D.                 DATE OF BIRTH:  [birth year]   DATE OF PROCEDURE:  04/26/2003  DATE OF DISCHARGE:                                 OPERATIVE REPORT   PROCEDURE:  Esophagogastroduodenoscopy.   INDICATIONS FOR PROCEDURE:  Abdominal pain, early satiety and bloating in a  patient who has deep fears regarding cancer of the stomach due to her  husband dying at age 74 from this disease.   DESCRIPTION OF PROCEDURE:  The patient was placed in the left lateral  decubitus position then placed on the pulse monitor with continuous low flow  oxygen delivered by nasal cannula. She was sedated with 62.5 mcg IV fentanyl  and 5 mg IV Versed. The Olympus video endoscope was advanced under direct  vision into the oropharynx and esophagus. The esophagus was straight and of  normal caliber with the squamocolumnar line at 38 cm. There was no visible  hiatal hernia, ring, stricture or other abnormality of the GE junction. The  stomach was entered and a small amount of liquid secretions were suctioned  from the fundus. Retroflexed view of the cardia was unremarkable. The  fundus, body, antrum and pylorus all appeared normal. The duodenum was  entered and both the bulb and second portion were well inspected and  appeared to be within normal limits. The scope was then withdrawn back into  the stomach and a CLOtest obtained. The scope was then withdrawn and the  patient returned to the recovery room in stable condition. She tolerated the  procedure well and there were no immediate complications.   IMPRESSION:  Basically normal endoscopy.   PLAN:  Await CLOtest and treat for eradication of Helicobacter if positive  and will obtain ultrasound of the liver, gallbladder and  pancreas.                                               John C. Madilyn Fireman, M.D.    JCH/MEDQ  D:  04/26/2003  T:  04/26/2003  Job:  [id number]

## 2011-05-10 NOTE — Discharge Summary (Signed)
NAME:  Whitney Benton, Whitney Benton                 ACCOUNT NO.:  [id number]   MEDICAL RECORD NO.:  [id number]           PATIENT TYPE:   LOCATION:                                 FACILITY:   PHYSICIAN:  Phil D. Okey Dupre, M.D.          DATE OF BIRTH:   DATE OF ADMISSION:  09/25/2005  DATE OF DISCHARGE:  09/27/2005                                 DISCHARGE SUMMARY   ADMITTING DIAGNOSES:  1.  Vaginal cuff infection.  2.  Uncontrolled pain.   DISCHARGE DIAGNOSES:  Vaginal cuff infection.   PROCEDURES:  None.   ADMITTING HISTORY AND PHYSICAL:  Patient is a 40 year old African-American  female who has had a transvaginal hysterectomy nine days prior to her  presentation to the [facility] who comes in complaining of foul green discharge  from her vagina, irregular loose bowel movements, and abdominal pain.  She  says she has had a fever, although she did not take her temperature with a  thermometer.   PHYSICAL EXAMINATION:  VITAL SIGNS:  Blood pressure 129/71, temperature  98.5, pulse 95, respiratory rate 18.  ABDOMEN:  Soft with generalized tenderness, especially in the epigastric  region, but no guarding, no rebound.  PELVIC:  Her external genitalia were wet with a foul discharge.  Vaginal  mucosa was normal.  She had heavy, thick, foul lemon-colored purulent  discharge and seems to be omitting from cuff area.  Bimanual examination was  nonrevealing.   HOSPITAL COURSE:  Patient had a CT examination which suggested post surgical  changes versus abscess.  Patient was started on IV antibiotics clindamycin,  gentamicin, and ampicillin.  Patient's pain was controlled with Percocet.  Patient's pain resolved.  She still was complaining of discharge at the time  of discharge, but was much better and did have formed stool at the time of  discharge.  Laboratory:  She did have a culture which showed moderate gram-  negative rods and few gram-positive cocci in pairs with no sensitivity or  identification at the time of  discharge.   DISCHARGE INSTRUCTIONS:  Patient was discharged to home.  She was to follow  up on the 13th as scheduled.  She was discharged on Flagyl 500 mg one p.o.  t.i.d. x8 doses, no refills, Cipro 500 mg one p.o. b.i.d. x8 days with no  refills, Darvocet-N 100 one p.o. q.4-6h. p.r.n. pain #20 with no refills.  Her diet was regular.  She was to follow the same instructions she had  received before her initial discharge regarding her activity.     ______________________________  August Saucer Merlene Morse, MD    ______________________________  Javier Glazier Okey Dupre, M.D.    ABC/MEDQ  D:  09/27/2005  T:  09/27/2005  Job:  [id number]

## 2011-05-10 NOTE — Discharge Summary (Signed)
NAME:  Whitney Benton, Whitney Benton                 ACCOUNT NO.:  [id number]   MEDICAL RECORD NO.:  [id number]          PATIENT TYPE:  INP   LOCATION:  9304                          [facility]ILITY[facility]   PHYSICIAN:  Tanya S. Shawnie Pons, M.D.   DATE OF BIRTH:  08-03-1971   DATE OF ADMISSION:  09/16/2005  DATE OF DISCHARGE:  09/18/2005                                 DISCHARGE SUMMARY   FINAL DIAGNOSES:  Fibroid uterus, anemia, menometrorrhagia, pelvic pain.   PROCEDURES:  Transvaginal hysterectomy.   LABORATORIES:  Preoperative hemoglobin 10.1, postoperative hemoglobin 8.9.  Urine pregnancy test negative.   REASON FOR ADMISSION:  Briefly, patient is a 39 year old gravida 6, para 3-0-  3-3 who had fibroid uterus, pelvic pain, menometrorrhagia, and anemia who  was admitted for surgery.   HOSPITAL COURSE:  Patient was admitted, underwent the above procedure.  Postoperatively the patient was transferred to the third floor where vaginal  packing and Foley catheter were in.  On postoperative day #1 these were  removed and the patient was ambulating.  She did have ___________ that  morning and was not passing any flatus.  Her IV pain control and fluids were  discontinued on that day.  On postoperative day #2 she still had had no  flatus, but had no nausea or emesis since 09/17/05 a.m.  She remained  afebrile.  Her vital signs were stable.  Because she is tolerating a regular  diet it was felt she was stable for discharge.   DISCHARGE DISPOSITION AND CONDITION:  Patient discharged home in good  condition.   PERTINENT DISCHARGE INSTRUCTIONS:  Return with fever greater than 101 or  persistent nausea and vomiting.   DISCHARGE MEDICATIONS:  Percocet 5/325 one to two p.o. q.4-6h. p.r.n. pain  #40.   Follow-up will be in the GYN clinic in two weeks.           ______________________________  Shelbie Proctor Shawnie Pons, M.D.     TSP/MEDQ  D:  09/18/2005  T:  09/18/2005  Job:  [id number]

## 2011-05-10 NOTE — Consult Note (Signed)
NAME:  Whitney Benton, Whitney Benton NO.:  [id number]   MEDICAL RECORD NO.:  [id number]                   PATIENT TYPE:  EMS   LOCATION:  ED                                   FACILITY:  [facility]   PHYSICIAN:  Marlan Palau, M.D.               DATE OF BIRTH:  05-15-1971   DATE OF CONSULTATION:  DATE OF DISCHARGE:  07/19/2002                              NEUROLOGY CONSULTATION   HISTORY OF PRESENT ILLNESS:  The patient is a 40 year old right-handed black  female born September 09, 1971, with a history of occasional mild  headaches.  The patient generally gets her headaches around the onset of her  menstrual cycle, is able to take some Motrin which eliminates the headache.  This patient, however, began to have a bifrontal headache, being on the left  and spreading to the right, beginning a week ago in the evening hours. This  has persisted until the present time.  The patient has been through the  emergency room  on four occasions in the last seven days.  The patient  underwent a lumbar puncture approximately five days ago and has had a CT  scan of the brain, both of which were unremarkable.  Since the lumbar  puncture, this patient has developed a different type of headache,  associated with some neck stiffness, pain in the back of the head and  positional component to the headache, worse with sitting and standing,  better but not eliminated when she is lying down.  The patient denies any  numbness or weakness on the face, arms, or legs.  She does have some blurred  vision.  She denies loss of vision.  She does note some nausea, no vomiting.  This patient is brought into the emergency room  once again for headache and  claims the headache has never completely gone away at any point over the  last week.  Neurology is asked to see this patient for further evaluation.   PAST MEDICAL HISTORY:  1. History of headache as above.  2. History of cesarean section.  3. Recent  urinary tract infection, treated within the last five days.  4. History of D&C.   ALLERGIES:  The patient states an allergy to ASPIRIN which gives her hives.   SOCIAL HISTORY:  She does not smoke or drink.  The patient denies the use of  any drugs such as cocaine, marijuana, or heroin.  The patient is separated,  has three children, lives in the [place], [place], area.  She does  not work.   MEDICATIONS:  The patient has been on some Vicodin for pain, otherwise is on  no medications.  Phenergan has also been taking for some nausea.   FAMILY HISTORY:  This is notable for some problems with diabetes and  hypertension in the mother.  Medical history concerning the father is  unknown.  The patient has one  sister who is alive and well.  No family  history of headaches is noted.   REVIEW OF SYMPTOMS:  This is notable for no fevers, chills. The patient  denies shortness of breath, occasional chest pain is noted, no abdominal  pain, nausea noted.  Recent urinary tract infection.  The patient denies any  loss of consciousness.  She does note some dizziness off and on.  Headaches  currently are throbbing in nature.  Photophobia is associated with the  headache.   PHYSICAL EXAMINATION:  GENERAL APPEARANCE:  This patient is a well-developed black female who is  alert, in no apparent distress.   VITAL SIGNS:  Blood pressure is 127/85, heart rate 68, respiratory rate 18,  temperature afebrile.   HEENT:  Head is atraumatic.  Eyes:  Pupils are equal, round, and reactive to  light.  Discs are flat bilaterally.   NECK:  Supple.  No carotid bruits noted.   LUNGS:  Clear   CARDIOVASCULAR:  Regular rate and rhythm, no obvious murmurs or rubs noted.   EXTREMITIES:  No significant edema.   NEUROLOGIC:  Cranial nerves as above.  Facial symmetry is present.  The  patient has good sensation of the face to pinprick, soft touch bilaterally.  Good strength to facial muscles and the muscles  to head turning, shoulder  shrug bilaterally.  Speech is well enunciated.  Motor testing revealed 5/5  strength in all fours.  Good symmetric motor tone is noted throughout.  Sensory testing is intact to pinprick, soft touch, vibratory sensation  throughout.  The patient has good finger-nose-finger and toe-to-finger  bilaterally.  Gait was not tested.  Deep tendon reflexes are symmetric.  Toes neutral and downgoing bilaterally.   LABORATORY VALUES:  From prior evaluations include, spinal fluid with 1  white count, 0 red cells, glucose 57, protein 26.  Urinalysis reveals  specific gravity 1.029, pH 6.5, 11 to 20 white cells.   CT of the head is unremarkable.  I have reviewed the scan.   IMPRESSION:  1. Probable migraine headache complicated by post lumbar puncture headache.   This patient claims she is not sleeping well, appears to have a definite  positional component to the headache.  The patient does not appear to be in  significant pain at this point.  Will treat symptomatically at this time.  If headache is not improved in the next day or two, she is to contact our  office and we will set her up for a blood patch.  The patient may need to  keep her head down as much as possible over the next couple of days.  The  patient will follow up with [facility] if needed.                                                Marlan Palau, M.D.    CKW/MEDQ  D:  07/19/2002  T:  07/23/2002  Job:  [id number]   cc:   [address] [facility]

## 2011-05-10 NOTE — Op Note (Signed)
NAME:  Whitney Benton, Whitney Benton                           ACCOUNT NO.:  [id number]   MEDICAL RECORD NO.:  [id number]                   PATIENT TYPE:  AMB   LOCATION:  ENDO                                 FACILITY:  [facility]   PHYSICIAN:  John C. Madilyn Fireman, M.D.                 DATE OF BIRTH:  01/17/1971   DATE OF PROCEDURE:  11/09/2003  DATE OF DISCHARGE:                                 OPERATIVE REPORT   PROCEDURE:  Colonoscopy.   INDICATIONS FOR PROCEDURE:  Patient with abdominal bloating, abdominal pain  and rectal bleeding with negative upper GI, small bowel series and abdominal  ultrasound.   DESCRIPTION OF PROCEDURE:  The patient was placed in the left lateral  decubitus position then placed on the pulse monitor with continuous low flow  oxygen delivered by nasal cannula. She was sedated with 100 mcg IV fentany  and 8mg  IV Versed. The Olympus video colonoscope was inserted into the  rectum and advanced to the cecum, confirmed by transillumination at  McBurney's point and visualization of the ileocecal valve and appendiceal  orifice. The prep was excellent. The terminal ileum was intubated and  explored for several centimeters and appeared to be within normal limits.  The cecum, ascending, transverse, descending and sigmoid colon all appeared  normal with no masses, polyps, diverticula or other mucosal abnormalities.  The rectum likewise appeared normal and retroflexed view of the anus  revealed only small internal hemorrhoids. The scope was then withdrawn and  the patient returned to the recovery room in stable condition. She tolerated  the procedure well and there were no immediate complications.   IMPRESSION:  Internal hemorrhoids otherwise normal colonoscopy.   PLAN:  1. Treat hemorrhoids symptomatically.  2. Reassurance.  3. Will follow clinically and treat future symptoms symptomatically.                                               John C. Madilyn Fireman, M.D.    JCH/MEDQ  D:  11/09/2003   T:  11/10/2003  Job:  [id number]

## 2011-05-10 NOTE — Group Therapy Note (Signed)
NAME:  Whitney Benton, Whitney Benton NO.:  [id number]   MEDICAL RECORD NO.:  [id number]          PATIENT TYPE:  WOC   LOCATION:  [facility]                   FACILITY:  [facility]   PHYSICIAN:  Argentina Donovan, MD        DATE OF BIRTH:  1971/07/30   DATE OF SERVICE:                                    CLINIC NOTE   CHIEF COMPLAINT:  Vaginal spotting and lower anterior abdominal pain.   SUBJECTIVE:  The patient had a hysterectomy performed back in September of  2006.  The hysterectomy was performed for fibroids.  Has done remarkably  well since then.  Has not had any kind of vaginal bleeding or abdominal pain  since.  However, this morning the patient was in her usual state of health.  When she went to the bathroom, she noticed some spotting and some staining  on the toilet paper when she used the bathroom.  There was no blood in the  bowl.  She has also had some intermittent lower anterior abdominal pain  since this morning.  She describes it as kind of cramp like with some  dysuria, and burning when she pees.   OBJECTIVE:  VITAL SIGNS:  Note.   PHYSICAL EXAMINATION:  GENERAL:  In no apparent distress.  Alert and  oriented x3.  CARDIOVASCULAR:  Regular rate and rhythm.  No murmurs, gallops, or rubs.  PULMONARY EXAMINATION:  Clear to auscultation bilaterally.  No wheezes,  crackles, or rales.  ABDOMEN EXAMINATION:  Soft, nontender, and non-distended.  Positive bowel  sounds.  GENITOURINARY EXAMINATION:  The patient has normal, healthy appearing  external female genitalia.  There is no obvious tears or abrasions proximal  to the urethra.  She has no cyst, or abscesses on vulva or labia.  She has a  well healed cuff with no obvious signs of granulation tissue.  There are no  intervaginal lesions or abrasions that could account for bleeding or pain.  She she has no pain on bimanual exam of the vagina.  She has no adnexal  tenderness, or no tenderness at the cuff.  RECTAL EXAMINATION:   The patient has a normal rectum with no anal fissures.  She has no external hemorrhoids, and no palpable internal hemorrhoids.   ASSESSMENT AND PLAN:  Some spotting.  Not exactly sure this the source of  the spotting.  The physical exam of the vagina, cuff, external genitalia and  rectum show no obvious source of bleeding.  Giving her dysuria, we will  treat her symptomatically as a cystitis.  We will send urine and culture.  We will also send a wet prep.  We will give the patient a prescription for  Bactrim one p.o. b.i.d. for three days and Ibuprofen 600 one p.o. q.6 h.  p.r.n., and instructed her to follow-up in two weeks to assess resolution of  her symptoms.  I instructed her to call back if symptoms progressed.           ______________________________  Argentina Donovan, MD     PR/MEDQ  D:  01/07/2006  T:  01/07/2006  Job:  [id number]

## 2011-05-10 NOTE — Op Note (Signed)
NAME:  Whitney Benton, Whitney Benton                 ACCOUNT NO.:  [id number]   MEDICAL RECORD NO.:  [id number]          PATIENT TYPE:  OBV   LOCATION:  9304                          [facility]   PHYSICIAN:  Tanya S. Shawnie Pons, M.D.   DATE OF BIRTH:  11/18/1971   DATE OF PROCEDURE:  DATE OF DISCHARGE:                                 OPERATIVE REPORT   PREOPERATIVE DIAGNOSES:  1.  Fibroid uterus.  2.  Pelvic pain.  3.  Menorrhagia.  4.  Anemia.   POSTOPERATIVE DIAGNOSES:  1.  Fibroid uterus.  2.  Pelvic pain.  3.  Menorrhagia.  4.  Anemia.   OPERATION/PROCEDURE:  Transvaginal hysterectomy.   SURGEON:  Shelbie Proctor. Shawnie Pons, M.D.   ASSISTANT:  Debbrah Alar, M.D.   ANESTHESIA:  General, local.   SPECIMENS:  Uterus.   ESTIMATED BLOOD LOSS:  100 mL.   COMPLICATIONS:  None.   INDICATIONS:  Briefly, the patient is a 40 year old gravida 6, para 3-0-3-3,  who was diagnosed with a fibroid uterus and pelvic pain.  The patient  reported she was unable to sleep and could not function at work.  She had an  inability to get out of bed.  She also had heavy regular cycles with a  hemoglobin noted to be 10.1.  The patient desired permanent treatment and  was scheduled for a transvaginal hysterectomy.  The patient was counseled  regarding risks of the procedure including injury to the bladder, given that  she had three prior cesarean sections.   DESCRIPTION OF PROCEDURE:  The patient was taken to the operating room where  anesthesia was induced.  She was then prepped and draped in the usual  sterile fashion, placed in the dorsal lithotomy in Allen stirrups.  A  straight catheter was used to drain her bladder and 1 mL of methylene blue  was inserted into the bladder.  The cervix was then injected with 20 mL of  1% lidocaine with epinephrine and a circumferential incision was made around  the cervix.  Sharp dissection was used to enter the anterior peritoneal  cavity.  The bladder was densely adherent  but eventually the anterior cavity  was entered.  Posterior cavity was then entered sharply.  A long weighted  speculum was placed inside the vagina.  __________ clamp was then used to  secure the uterosacral ligaments which were cut and then suture ligated with  a Heaney-type suture of 0 Vicryl followed by free ligature.  This was  repeated on the opposite.  The uterine arteries were similarly grasped  bilaterally with Heaney clamp, cut and suture ligated in a Heaney type  fashion followed by free ties for hemostasis.  One more bite was taken on  each side in the same fashion until the uterine fundus could be delivered  posteriorly.  The cervix was then amputated.  The uterine fundus was felt to  be relatively large and a Corning method was used to decrease the volume of  the uterus.  The ovarian pedicles were then identified and grasped with  Heaney clamps and cut.  Two free ties were placed about each. These pedicles  were held.  Once the specimen was removed, the tubo-ovarian and uterosacral  ligament pedicles were looked at an found to be hemostatic.  An angle suture  was then done from the corner of the vagina into the anterior abdominal  cavity, then back out, through the posterior peritoneum, and through the  uterosacral ligament.  This was tied down with uterosacral ligament being on  the inside of this.  The posterior cuff was then run with a locked running  suture for hemostasis, incorporating the posterior peritoneum and the  vaginal cuff closed with a 0 Vicryl running stitch.  A Foley catheter was  then placed inside the bladder and clear blue urine was noted.  The vagina  was then packed with one-inch vaginal packing with Premarin cream on it.  The patient tolerated the procedure well.  All instrument, needle and lap  counts were correct x2.  The patient was awakened and taken to the recovery  room in stable condition.           ______________________________  Shelbie Proctor  Shawnie Pons, M.D.     TSP/MEDQ  D:  09/16/2005  T:  09/16/2005  Job:  [id number]

## 2011-05-10 NOTE — Group Therapy Note (Signed)
NAME:  Whitney Benton, Whitney Benton NO.:  [id number]   MEDICAL RECORD NO.:  [id number]          PATIENT TYPE:  WOC   LOCATION:  [facility]                   FACILITY:  [facility]   PHYSICIAN:  Tinnie Gens, MD        DATE OF BIRTH:  Apr 24, 1971   DATE OF SERVICE:  07/11/2005                                    CLINIC NOTE   CHIEF COMPLAINT:  Fibroid uterus.   HISTORY OF PRESENT ILLNESS:  The patient is a 40 year old gravida 6, para 3-  0-3-3 who went to the [facility]U apparently with vaginal discharge, and was  diagnosed with Trichomonas.  She was treated for that.  However, she was  also found to have a slightly enlarged uterus, but was slightly tender, and  she had lower abdominal pain as well.  The patient was sent for a pelvic  ultrasound which showed 3 fibroids, the largest of which was 3.0 cm.  The  whole uterus was 10 weeks' size.  Her endometrial stripe was approximately  15.0-16.0 mm, and felt to be thickened.  The patient has been on ibuprofen  since that time.  She continues to have pain.  She says she cannot hardly  walk, and she cannot sleep.  The patient desires hysterectomy for full  treatment.   PAST MEDICAL HISTORY:  Negative.   PAST SURGICAL HISTORY:  C section x 3, D&C x1 of the twin gestation.   MEDICATIONS:  None.   ALLERGIES:  Aspirin.   OBSTETRICAL HISTORY:  Three C sections, one emergent, 2 elective repeats,  and 3 miscarriages.   GYNECOLOGICAL HISTORY:  Menarche at age 36.  Cycles are every 26-28 days  with heavy-to-medium menstrual flow.  The problem is pain associated with  her periods.  The patient is status post tubal ligation.   FAMILY HISTORY:  Significant for diabetes and hypertension.   SOCIAL HISTORY:  She does smoke about a 1/3 of a pack a day.  She works as a  Advertising copywriter.  No alcohol or drug use.   REVIEW OF SYSTEMS:  A 14-point review of systems was reviewed, and is  negative except for some occasional nausea and vomiting, painful  intercourse  and frequent headaches.   PHYSICAL EXAMINATION:  VITAL SIGNS:  Her vitals are as noted on the chart.  GENERAL:  This is a well-developed, well-nourished, black female in no acute  distress.  The patient is on her period so her pelvic and abdomen exam is  deferred today.   IMPRESSION:  Fibroid uterus with severe pelvic pain with menstrual cycles as  well as intermittent menstrual pain and dyspareunia likely related to  fibroid uterus, although it may not be.   PLAN:  1.  Schedule patient for endometrial biopsy and Pap smear.  2.  We will go ahead and put her on the schedule for a vaginal hysterectomy.      I did advise her about the risks of this procedure including bladder      injury given that she has 3 prior C-sections.  The patient understood      all of these  risks, but was willing to proceed with hysterectomy, and      she is not felt to be a good candidate for other treatments secondary to      inadequate treatment alternatives for fibroid uterus.       TP/MEDQ  D:  07/11/2005  T:  07/11/2005  Job:  [id number]

## 2011-05-14 ENCOUNTER — Ambulatory Visit (HOSPITAL_COMMUNITY)
Admission: RE | Admit: 2011-05-14 | Discharge: 2011-05-14 | Disposition: A | Discharge status: Home / Self Care | Payer: Medicaid Other | Source: Ambulatory Visit | Attending: Family Medicine | Admitting: Family Medicine

## 2011-05-14 DIAGNOSIS — Z1231 Encounter for screening mammogram for malignant neoplasm of breast: Secondary | ICD-10-CM

## 2011-05-15 ENCOUNTER — Other Ambulatory Visit: Payer: Self-pay | Admitting: Family Medicine

## 2011-05-15 DIAGNOSIS — N63 Unspecified lump in unspecified breast: Secondary | ICD-10-CM

## 2011-05-16 ENCOUNTER — Other Ambulatory Visit: Payer: Self-pay | Admitting: Family Medicine

## 2011-05-17 ENCOUNTER — Ambulatory Visit: Payer: Medicaid Other | Admitting: Obstetrics and Gynecology

## 2011-05-17 ENCOUNTER — Other Ambulatory Visit: Payer: Self-pay | Admitting: Family Medicine

## 2011-05-17 DIAGNOSIS — N63 Unspecified lump in unspecified breast: Secondary | ICD-10-CM

## 2011-05-24 ENCOUNTER — Ambulatory Visit
Admission: RE | Admit: 2011-05-24 | Discharge: 2011-05-24 | Disposition: A | Discharge status: Home / Self Care | Payer: Medicaid Other | Source: Ambulatory Visit | Attending: Family Medicine | Admitting: Family Medicine

## 2011-05-24 DIAGNOSIS — N63 Unspecified lump in unspecified breast: Secondary | ICD-10-CM

## 2011-05-27 ENCOUNTER — Ambulatory Visit (INDEPENDENT_AMBULATORY_CARE_PROVIDER_SITE_OTHER): Payer: Medicaid Other | Admitting: Family Medicine

## 2011-05-27 DIAGNOSIS — N951 Menopausal and female climacteric states: Secondary | ICD-10-CM

## 2011-05-28 NOTE — Group Therapy Note (Signed)
NAME:  Whitney Benton, Whitney Benton NO.:  [id number]  MEDICAL RECORD NO.:  [id number]           PATIENT TYPE:  A  LOCATION:  [facility]                   FACILITY:  [facility]  PHYSICIAN:  Lucina Mellow, DO   DATE OF BIRTH:  Jul 05, 1971  DATE OF SERVICE:  05/27/2011                                 CLINIC NOTE  The patient is a 40 year old female who had a hysterectomy by Dr. Shawnie Pons in 2006.  She presented to the clinic in April 2012, and saw Dr. Debroah Loop for some postmenopausal symptoms.  She did not have her ovaries removed. She was offered hormone replacement therapy and was given a prescription for Estratest H.S. daily.  The patient presents today for followup on that new medication.  The patient states that the medication did help with some of her hot flashes, but she still does not like the way it makes her feel and in general most of it is that she still does not have any sexual drive and has quite a bit of vaginal dryness, so when she does have sex it is very uncomfortable and it hurts.  She reports that she would like something to help with her vaginal dryness because that seems to be her biggest problem at this time.  The patient reports that she has never tried any prescription vaginal medications or estrogen replacement.  She has tried some of the over-the-counter lubricants without any relief of her symptoms.  PHYSICAL EXAMINATION:  VITAL SIGNS:  The patient's blood pressure is 115/77, temperature 98.9, pulse 64, weight 186.3, height of 62 inches. GENERAL:  The patient is a pleasant African American female who looks her stated age of 62. HEART:  Regular rate and rhythm. LUNGS:  Clear to auscultation bilaterally.  ASSESSMENT:  Menopausal symptoms to include vasomotor symptoms and vaginal dryness, but not limited.  We discussed her options and the patient decided she would like to do a trial of vaginal Premarin.  The prescription provided for her today is Premarin 0.625  mg/g and she is to place one full applicator per vagina each night for 2 weeks and then to one half applicator for twice a week.  The patient is instructed that she should do this after having any intercourse and not before and this may also control with her hot flashes, since it is systemic.  It can be systemically absorbed.  The patient is recommend that she continue all of her primary care as far as her blood pressure control and her mammograms.  The patient is also recommended.  We will follow closely with her primary care provider for any other medical issues in that with an estrogen at increased risk for clots.  The patient voices her understanding and agrees with this plan.  She will follow up in approximately 1-2 months to discuss the treatment.  If the treatment is not helping her, in fact making things worse, she can discontinue the medication and give [place] a call and make an earlier appointment.          ______________________________ Lucina Mellow, DO    SH/MEDQ  D:  05/27/2011  T:  05/28/2011  Job:  [id number]

## 2011-09-05 ENCOUNTER — Ambulatory Visit: Payer: Medicaid Other | Admitting: Obstetrics and Gynecology

## 2011-09-06 ENCOUNTER — Ambulatory Visit (INDEPENDENT_AMBULATORY_CARE_PROVIDER_SITE_OTHER): Payer: Medicaid Other | Admitting: Advanced Practice Midwife

## 2011-09-06 ENCOUNTER — Encounter: Payer: Self-pay | Admitting: Advanced Practice Midwife

## 2011-09-06 VITALS — BP 110/72 | HR 72 | Temp 97.4°F | Ht 63.0 in | Wt 186.0 lb

## 2011-09-06 DIAGNOSIS — D259 Leiomyoma of uterus, unspecified: Secondary | ICD-10-CM

## 2011-09-06 DIAGNOSIS — Z90710 Acquired absence of both cervix and uterus: Secondary | ICD-10-CM

## 2011-09-06 DIAGNOSIS — N898 Other specified noninflammatory disorders of vagina: Secondary | ICD-10-CM

## 2011-09-06 NOTE — Progress Notes (Signed)
Complains of vaginal discharge- vaginal itching and irritation off and on x 1 week with bad vaginal odor, also c/o a bump on top on right labia that causes pain if it is touched or bumped

## 2011-09-06 NOTE — Progress Notes (Signed)
  Subjective:    Patient ID: Whitney Benton, female    DOB: Oct 08, 1971, 40 y.o.   MRN: [id number]  HPI: [facility] increased malodorous vaginal discharge and a lump on her mons x a few days. She states she is in a mutually monogamous relationship. She denies lesions. She is S/P hysterectomy for fibroids and has no Hx of abn Paps.    Review of Systems Otherwise neg    Objective:   Physical Exam BUS: 2x2 cm tender lump right of midline on mons.  Pelvic: scant amount of thin, white, malodorous discharge. No lesions.      Assessment & Plan:  Assessment: 1. Possible BV 2. Foliculitis  Plan: 1. Wet Prep, GC/CT. Will call w/ results 2. Frequent warm soaks and compresses. F/U PRN for worsening of foliculitis

## 2011-09-07 LAB — GC/CHLAMYDIA PROBE AMP, GENITAL: Chlamydia, DNA Probe: NEGATIVE

## 2011-09-07 LAB — WET PREP, GENITAL
Trich, Wet Prep: NONE SEEN
Yeast Wet Prep HPF POC: NONE SEEN

## 2011-09-08 DIAGNOSIS — Z90710 Acquired absence of both cervix and uterus: Secondary | ICD-10-CM | POA: Insufficient documentation

## 2011-09-08 DIAGNOSIS — D259 Leiomyoma of uterus, unspecified: Secondary | ICD-10-CM | POA: Insufficient documentation

## 2011-09-11 LAB — CBC
Hemoglobin: 13
RBC: 4.27
WBC: 7.9

## 2011-09-11 LAB — COMPREHENSIVE METABOLIC PANEL
ALT: 11
AST: 16
Alkaline Phosphatase: 56
CO2: 29
GFR calc Af Amer: >60
GFR calc non Af Amer: >60
Glucose, Bld: 97
Potassium: 3.3 — ABNORMAL LOW
Sodium: 142

## 2011-09-11 LAB — DIFFERENTIAL
Basophils Relative: 1
Eosinophils Absolute: 0.1
Eosinophils Relative: 2
Monocytes Relative: 7
Neutrophils Relative %: 55

## 2011-09-11 LAB — POCT CARDIAC MARKERS
Operator id: 4295
Troponin i, poc: <0.05

## 2011-09-11 LAB — D-DIMER, QUANTITATIVE: D-Dimer, Quant: <0.22

## 2011-09-19 LAB — HEPATIC FUNCTION PANEL
ALT: 8
AST: 14
Alkaline Phosphatase: 59
Bilirubin, Direct: 0.1
Indirect Bilirubin: 0.6
Total Bilirubin: 0.7

## 2011-09-19 LAB — BASIC METABOLIC PANEL
CO2: 28
Chloride: 102
Creatinine, Ser: 0.79
GFR calc Af Amer: >60
Potassium: 3.4 — ABNORMAL LOW

## 2011-09-19 LAB — CBC
HCT: 41.4
Hemoglobin: 13.7
MCHC: 33.2
MCV: 91
RBC: 4.55
WBC: 10.3

## 2011-09-19 LAB — DIFFERENTIAL
Basophils Relative: 0
Eosinophils Relative: 3
Lymphs Abs: 3.4
Monocytes Absolute: 0.6
Monocytes Relative: 6
Neutro Abs: 6

## 2011-09-19 LAB — POCT CARDIAC MARKERS: Myoglobin, poc: 30

## 2011-09-19 LAB — D-DIMER, QUANTITATIVE: D-Dimer, Quant: <0.22

## 2011-09-23 LAB — DIFFERENTIAL
Basophils Relative: 0
Eosinophils Relative: 3
Lymphocytes Relative: 39
Monocytes Absolute: 0.3
Monocytes Relative: 4
Neutro Abs: 3.8

## 2011-09-23 LAB — CBC
HCT: 38.9
Hemoglobin: 13.2
MCHC: 33.9
RBC: 4.33

## 2011-09-23 LAB — BASIC METABOLIC PANEL
CO2: 31
Calcium: 9
GFR calc Af Amer: >60
Glucose, Bld: 102 — ABNORMAL HIGH
Potassium: 3.1 — ABNORMAL LOW
Sodium: 140

## 2011-09-23 LAB — POCT CARDIAC MARKERS: Troponin i, poc: <0.05

## 2011-10-07 ENCOUNTER — Telehealth: Payer: Self-pay | Admitting: *Deleted

## 2011-10-07 LAB — URINALYSIS, ROUTINE W REFLEX MICROSCOPIC
Nitrite: POSITIVE — AB
Specific Gravity, Urine: 1.027
Urobilinogen, UA: 1
pH: 6.5

## 2011-10-07 LAB — PREGNANCY, URINE: Preg Test, Ur: NEGATIVE

## 2011-10-07 LAB — URINE MICROSCOPIC-ADD ON

## 2011-10-07 NOTE — Telephone Encounter (Signed)
Pt requests results. I called pt and informed her of the following results: GC/Chlamydia- neg; wet prep- neg. Pt states she is still having a fishy odor from the vagina- the same as when she was seen @ clinic. I advised pt to try OTC Rephresh per package instructions. If she continues to have a problem, she should call back for re-exam. Pt voiced understanding,

## 2011-10-10 LAB — URINALYSIS, ROUTINE W REFLEX MICROSCOPIC
Glucose, UA: NEGATIVE
Protein, ur: NEGATIVE
Specific Gravity, Urine: 1.025
pH: 6

## 2011-10-10 LAB — URINE MICROSCOPIC-ADD ON

## 2011-10-10 LAB — WET PREP, GENITAL

## 2011-10-16 IMAGING — RF DG ESOPHAGUS
6 of 9 series · 15 of 24 positions shown · non-contrast
Comparison: 06/08/2008

CLINICAL DATA: Dysphagia.  Reflux.

ESOPHOGRAM/BARIUM SWALLOW
TECHNIQUE: Combined double contrast and single contrast
examination performed using effervescent crystals, thick barium
liquid, and thin barium liquid.
Fluoroscopy time:  1.2 minutes.

[Series 1: run · 4 of 15 slices shown (1 of 6)]
[im 1/15]
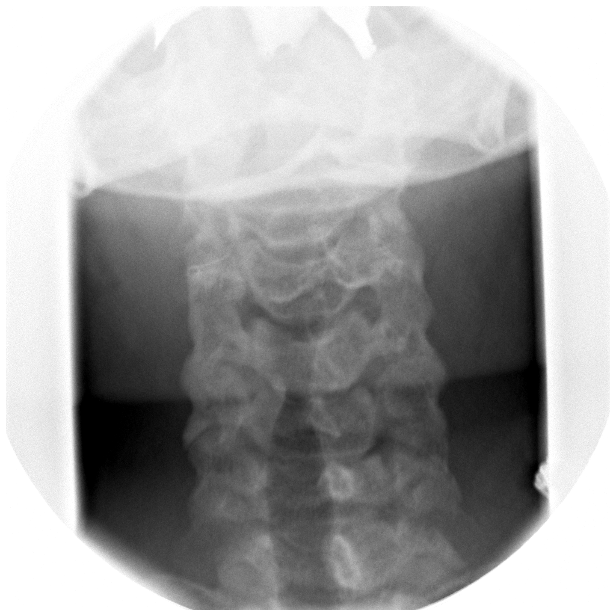
[im 5/15]
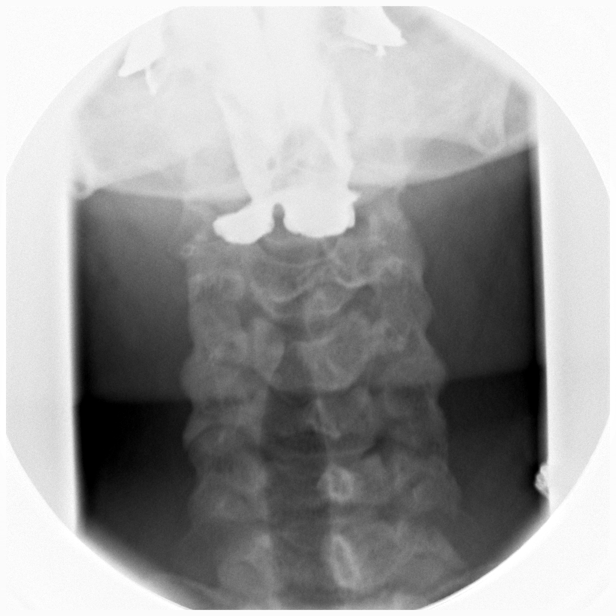
[im 10/15]
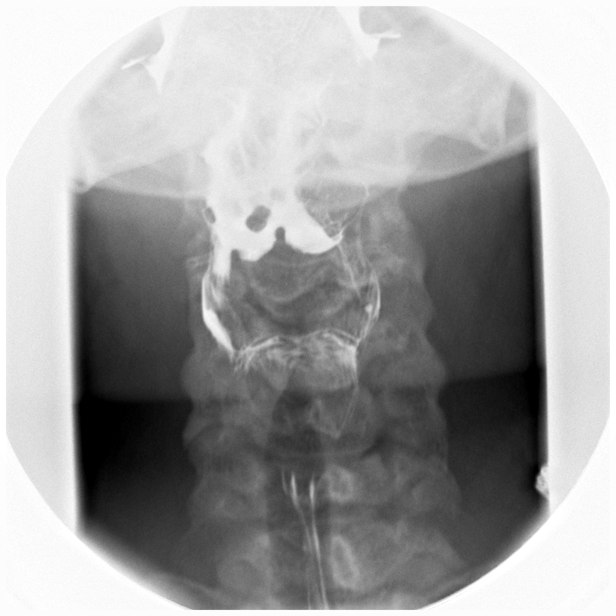
[im 12/15]
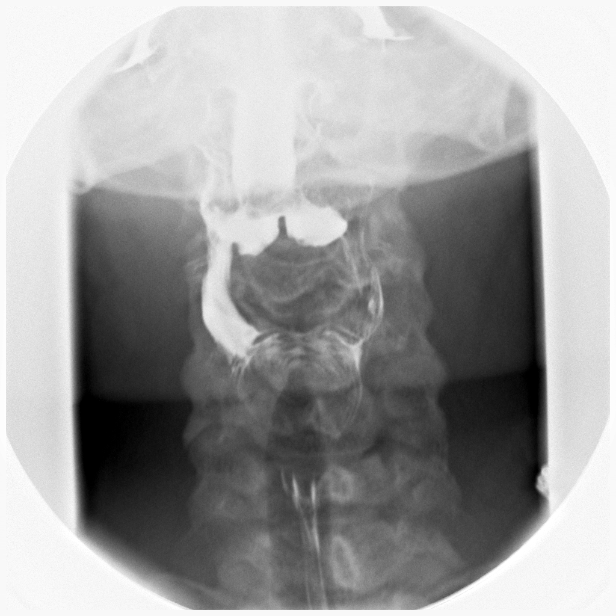

[Series 2: run · 7 of 18 slices shown (2 of 6)]
[im 1/18]
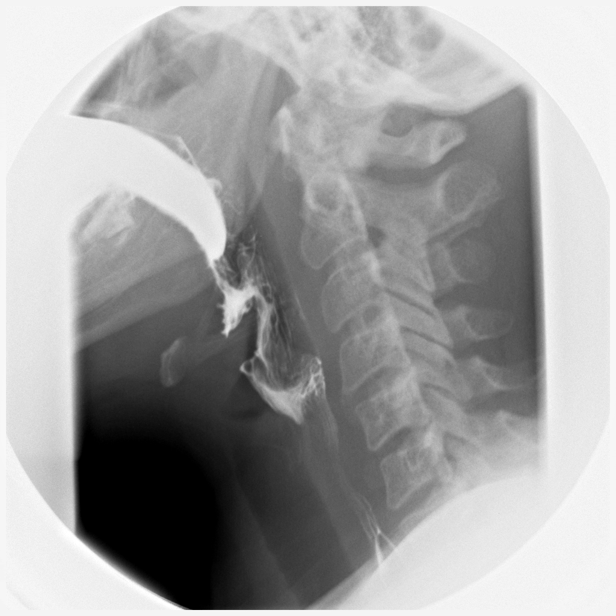
[im 2/18]
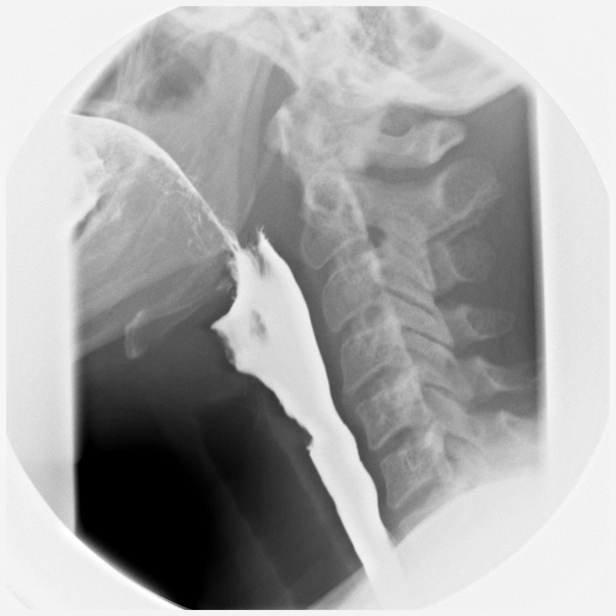
[im 6/18]
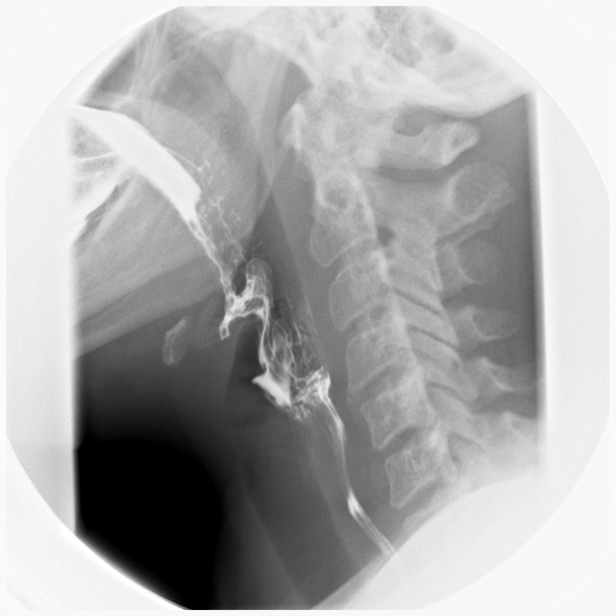
[im 10/18]
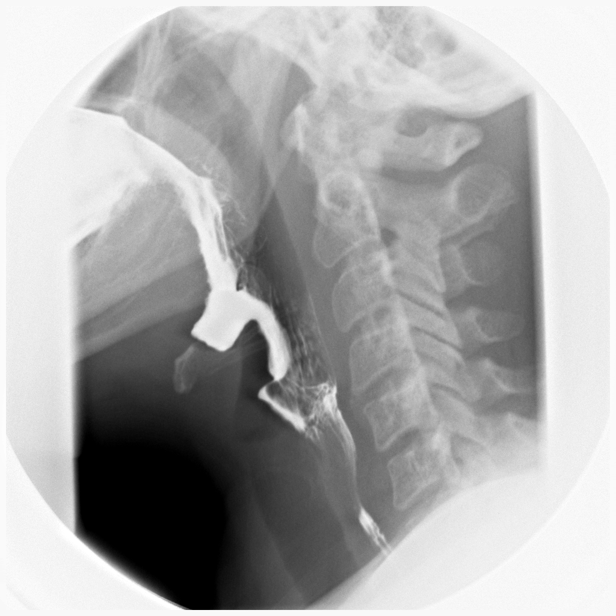
[im 12/18]
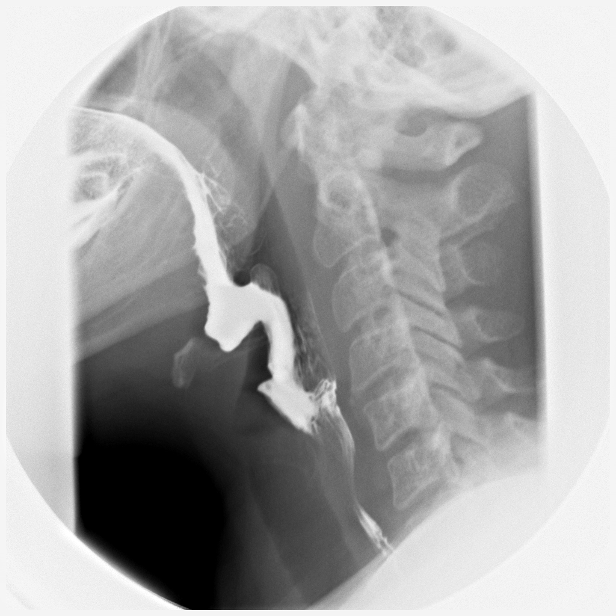
[im 16/18]
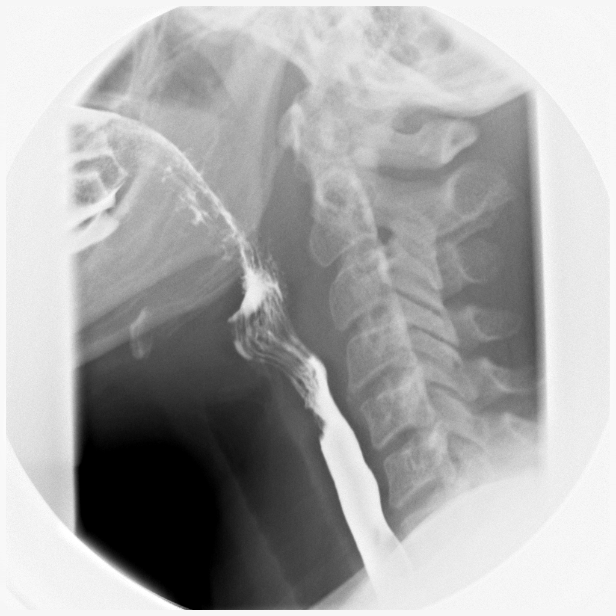
[im 18/18]
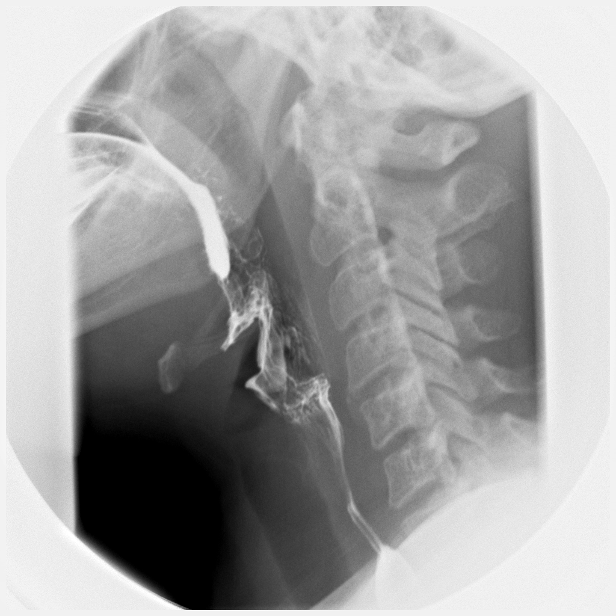

[Series 4: run · 1 of 1 slices shown (3 of 6)]
[im 1/1]
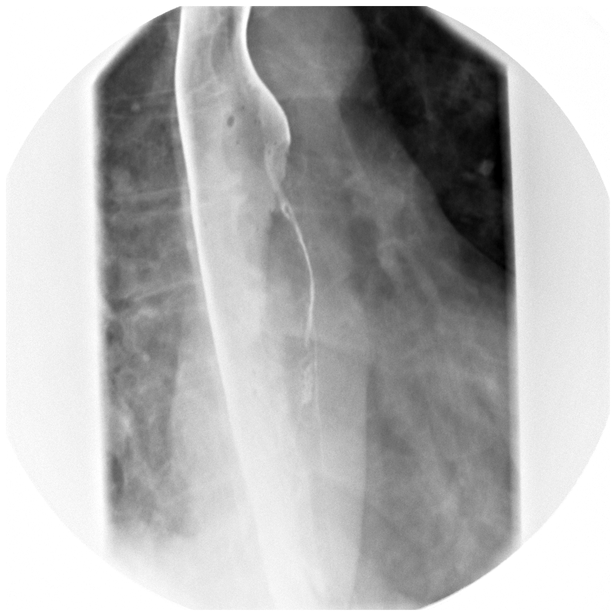

[Series 6: run · 1 of 1 slices shown (4 of 6)]
[im 1/1]
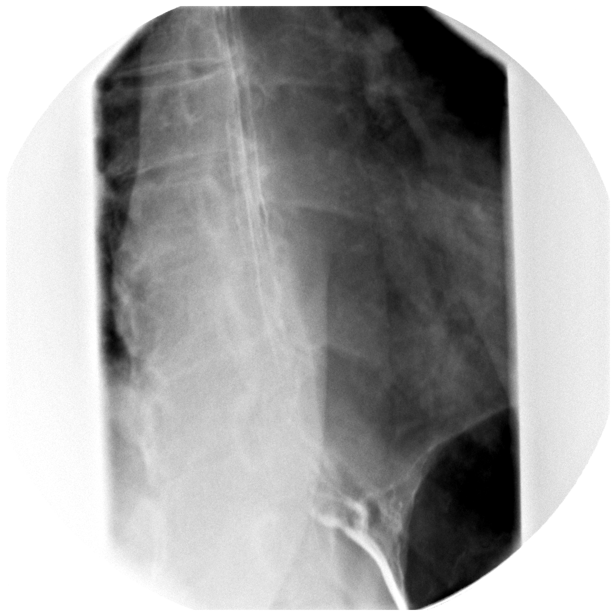

[Series 7: run · 1 of 1 slices shown (5 of 6)]
[im 1/1]
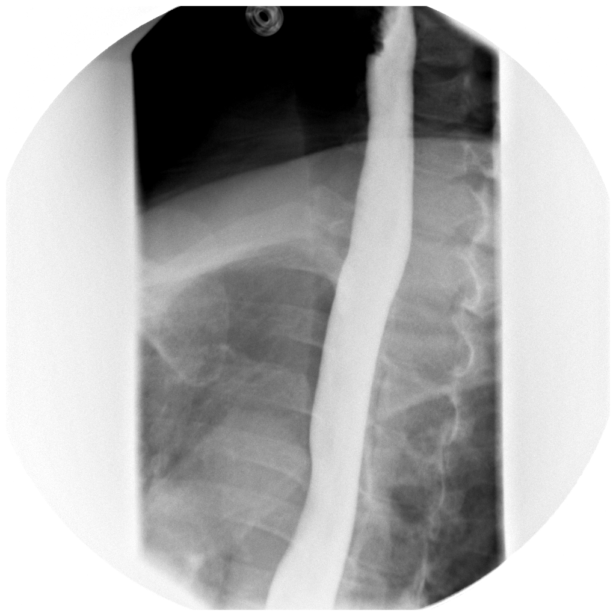

[Series 9: run · 1 of 1 slices shown (6 of 6)]
[im 1/1]
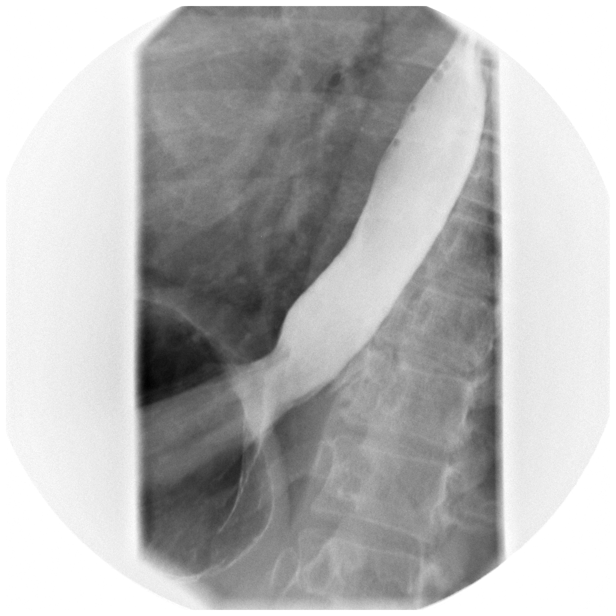

[15 of 24 positions shown; findings below may reference images not displayed]

FINDINGS: The pharyngeal phase of swallowing appears normal.

Double contrast images of the esophagram as and mucosal relief view
of the distal esophagus appear normal.

A single swallow of fluoroscopic observation reveals a normal
esophageal motility.  Full column thin barium assessment of the
esophagus demonstrates no stricture or other significant
abnormality.

A 13 mm barium tablet passed without difficulty into the stomach.
IMPRESSION: 1.  Normal esophagram.

## 2011-11-20 ENCOUNTER — Encounter (HOSPITAL_COMMUNITY): Payer: Self-pay | Admitting: *Deleted

## 2011-11-20 ENCOUNTER — Emergency Department (HOSPITAL_COMMUNITY)
Admission: EM | Admit: 2011-11-20 | Discharge: 2011-11-20 | Disposition: A | Discharge status: Home / Self Care | Payer: Medicaid Other | Attending: Emergency Medicine | Admitting: Emergency Medicine

## 2011-11-20 DIAGNOSIS — F329 Major depressive disorder, single episode, unspecified: Secondary | ICD-10-CM | POA: Insufficient documentation

## 2011-11-20 DIAGNOSIS — L2989 Other pruritus: Secondary | ICD-10-CM | POA: Insufficient documentation

## 2011-11-20 DIAGNOSIS — L298 Other pruritus: Secondary | ICD-10-CM | POA: Insufficient documentation

## 2011-11-20 DIAGNOSIS — Z79899 Other long term (current) drug therapy: Secondary | ICD-10-CM | POA: Insufficient documentation

## 2011-11-20 DIAGNOSIS — F3289 Other specified depressive episodes: Secondary | ICD-10-CM | POA: Insufficient documentation

## 2011-11-20 DIAGNOSIS — R21 Rash and other nonspecific skin eruption: Secondary | ICD-10-CM

## 2011-11-20 MED ORDER — DIPHENHYDRAMINE HCL 25 MG PO TABS: 25.0000 mg | ORAL_TABLET | Freq: Four times a day (QID) | ORAL | Status: AC

## 2011-11-20 MED ORDER — HYDROCORTISONE 1 % EX CREA: TOPICAL_CREAM | CUTANEOUS | Status: DC

## 2011-11-20 NOTE — ED Notes (Signed)
Pt began having a rash on her back Sunday, got worse Monday night.  Her child has this on his back and arms also.  No sob with this.

## 2011-11-20 NOTE — ED Provider Notes (Signed)
History     CSN: [id number] Arrival date & time: 11/20/2011  5:17 PM   First MD Initiated Contact with Patient 11/20/11 1848      Chief Complaint  Patient presents with  . Rash    (Consider location/radiation/quality/duration/timing/severity/associated sxs/prior treatment) HPI Patient presents with a complaint of rash. She states the rash is present on her upper back and bilateral arms. The rash began several days ago. The rash is associated with redness and itching. She has children at home that have a similar rash. There no systemic symptoms such as fever or difficulty breathing shortness of breath URI or cough. She's not tried anything for the rash. She has no history of similar rashes in the past. Nothing makes the symptoms better or worse.  Past Medical History  Diagnosis Date  . Depression     Past Surgical History  Procedure Date  . Cesarean section   . Abdominal hysterectomy 2006    Family History  Problem Relation Age of Onset  . Diabetes Mother   . Mental illness Mother   . Depression Mother   . Hypertension Mother   . Hypertension Father   . Diabetes Father     History  Substance Use Topics  . Smoking status: Current Everyday Smoker -- 0.2 packs/day    Types: Cigarettes  . Smokeless tobacco: Not on file  . Alcohol Use: No    OB History    Grav Para Term Preterm Abortions TAB SAB Ect Mult Living   5 3 3  2  2          Review of Systems ROS reviewed and otherwise negative except for mentioned in HPI  Allergies  Aspirin  Home Medications   Current Outpatient Rx  Name Route Sig Dispense Refill  . ALIGN PO CAPS Oral Take 1 capsule by mouth daily.      Marland Kitchen DIPHENHYDRAMINE HCL 25 MG PO TABS Oral Take 1 tablet (25 mg total) by mouth every 6 (six) hours. 20 tablet 0  . DULOXETINE HCL 60 MG PO CPEP Oral Take 60 mg by mouth daily.      Marland Kitchen HYDROCORTISONE 1 % EX CREA  Apply to affected area 3 times daily 15 g 0    BP 129/78  Pulse 89  Temp(Src) 98.2 F  (36.8 C) (Oral)  Resp 16  SpO2 100% Vitals reviewed Physical Exam Physical Examination: General appearance - alert, well appearing, and in no distress Mental status - alert, oriented to person, place, and time Mouth - mucous membranes moist, pharynx normal without lesions Chest - clear to auscultation, no wheezes, rales or rhonchi, symmetric air entry Heart - normal rate, regular rhythm, normal S1, S2, no murmurs, rubs, clicks or gallops Abdomen - soft, nontender, nondistended, no masses or organomegaly Musculoskeletal - no joint tenderness, deformity or swelling Extremities - peripheral pulses normal, no pedal edema, no clubbing or cyanosis Skin - scattered erythematous papules over back, bilateral forearms, no vesicles  ED Course  Procedures (including critical care time)  Labs Reviewed - No data to display No results found.   1. Rash       MDM  Patient presenting with pruritic rash over her back and arms. I recommend treatment with Benadryl and topical hydrocortisone cream. I do not believe these papules are specifically consistent with scabies however since there are other family members with similar rash in this as a possibility I have discussed with patient that I recommend starting with Benadryl and hydrocortisone and will give her  a prescription for permethrin cream to use if the topical treatments do not work. She is agreeable with this plan and was given strict return precautions.        Ethelda Chick, MD 11/20/11 [phone]

## 2012-06-12 ENCOUNTER — Encounter: Payer: Self-pay | Admitting: Obstetrics & Gynecology

## 2012-06-12 ENCOUNTER — Ambulatory Visit (INDEPENDENT_AMBULATORY_CARE_PROVIDER_SITE_OTHER): Payer: Medicaid Other | Admitting: Obstetrics & Gynecology

## 2012-06-12 VITALS — BP 116/84 | HR 79 | Temp 97.3°F | Ht 63.0 in | Wt 191.8 lb

## 2012-06-12 DIAGNOSIS — Z90710 Acquired absence of both cervix and uterus: Secondary | ICD-10-CM

## 2012-06-12 DIAGNOSIS — Z01419 Encounter for gynecological examination (general) (routine) without abnormal findings: Secondary | ICD-10-CM

## 2012-06-12 DIAGNOSIS — Z1231 Encounter for screening mammogram for malignant neoplasm of breast: Secondary | ICD-10-CM

## 2012-06-12 NOTE — Progress Notes (Signed)
  Subjective:     Whitney Benton is a 41 y.o. [phone] female and is here for a comprehensive gynecologic physical exam.  She is s/p TVH for fibroids on 09/16/05.  The patient reports no problems, occasional vaginal dryness alleviated with Rephresh vaginal lubricant. No other gynecologic problems.  History   Social History  . Marital Status: Single    Spouse Name: N/A    Number of Children: N/A  . Years of Education: N/A   Occupational History  . Not on file.   Social History Main Topics  . Smoking status: Current Everyday Smoker -- 0.2 packs/day    Types: Cigarettes  . Smokeless tobacco: Not on file  . Alcohol Use: No  . Drug Use: No  . Sexually Active: Yes    Birth Control/ Protection: Surgical   Other Topics Concern  . Not on file   Social History Narrative  . No narrative on file   Health Maintenance  Topic Date Due  . Pap Smear  09/09/1988  . Tetanus/tdap  09/09/1989  . Influenza Vaccine  09/22/2012   The following portions of the patient's history were reviewed and updated as appropriate: allergies, current medications, past family history, past medical history, past social history, past surgical history and problem list.  Review of Systems A comprehensive review of systems was negative.   Objective:   Blood pressure 116/84, pulse 79, temperature 97.3 F (36.3 C), temperature source Oral, height 5\' 3"  (1.6 m), weight 191 lb 12.8 oz (87 kg). GENERAL: Well-developed, well-nourished female in no acute distress.  HEENT: Normocephalic, atraumatic. Sclerae anicteric.  NECK: Supple. Normal thyroid.  LUNGS: Clear to auscultation bilaterally.  HEART: Regular rate and rhythm. BREASTS: Symmetric with everted nipples. No masses, skin changes, nipple drainage, or lymphadenopathy. ABDOMEN: Soft, nontender, nondistended. No organomegaly. PELVIC: Normal external female genitalia. Vagina is pink and rugated.  Normal discharge. Well-healed vaginal cuff. No adnexal mass or  tenderness.  EXTREMITIES: No cyanosis, clubbing, or edema, 2+ distal pulses.   Assessment:    Healthy female exam.    Plan:   Mammogram ordered  Routine preventative health maintenance measures emphasized

## 2012-06-12 NOTE — Patient Instructions (Signed)
Preventive Care for Adults, Female A healthy lifestyle and preventive care can promote health and wellness. Preventive health guidelines for women include the following key practices.  A routine yearly physical is a good way to check with your caregiver about your health and preventive screening. It is a chance to share any concerns and updates on your health, and to receive a thorough exam.   Visit your dentist for a routine exam and preventive care every 6 months. Brush your teeth twice a day and floss once a day. Good oral hygiene prevents tooth decay and gum disease.   The frequency of eye exams is based on your age, health, family medical history, use of contact lenses, and other factors. Follow your caregiver's recommendations for frequency of eye exams.   Eat a healthy diet. Foods like vegetables, fruits, whole grains, low-fat dairy products, and lean protein foods contain the nutrients you need without too many calories. Decrease your intake of foods high in solid fats, added sugars, and salt. Eat the right amount of calories for you.Get information about a proper diet from your caregiver, if necessary.   Regular physical exercise is one of the most important things you can do for your health. Most adults should get at least 150 minutes of moderate-intensity exercise (any activity that increases your heart rate and causes you to sweat) each week. In addition, most adults need muscle-strengthening exercises on 2 or more days a week.   Maintain a healthy weight. The body mass index (BMI) is a screening tool to identify possible weight problems. It provides an estimate of body fat based on height and weight. Your caregiver can help determine your BMI, and can help you achieve or maintain a healthy weight.For adults 20 years and older:   A BMI below 18.5 is considered underweight.   A BMI of 18.5 to 24.9 is normal.   A BMI of 25 to 29.9 is considered overweight.   A BMI of 30 and above  is considered obese.   Maintain normal blood lipids and cholesterol levels by exercising and minimizing your intake of saturated fat. Eat a balanced diet with plenty of fruit and vegetables. Blood tests for lipids and cholesterol should begin at age 20 and be repeated every 5 years. If your lipid or cholesterol levels are high, you are over 50, or you are at high risk for heart disease, you may need your cholesterol levels checked more frequently.Ongoing high lipid and cholesterol levels should be treated with medicines if diet and exercise are not effective.   If you smoke, find out from your caregiver how to quit. If you do not use tobacco, do not start.   If you are pregnant, do not drink alcohol. If you are breastfeeding, be very cautious about drinking alcohol. If you are not pregnant and choose to drink alcohol, do not exceed 1 drink per day. One drink is considered to be 12 ounces (355 mL) of beer, 5 ounces (148 mL) of wine, or 1.5 ounces (44 mL) of liquor.   Avoid use of street drugs. Do not share needles with anyone. Ask for help if you need support or instructions about stopping the use of drugs.   High blood pressure causes heart disease and increases the risk of stroke. Your blood pressure should be checked at least every 1 to 2 years. Ongoing high blood pressure should be treated with medicines if weight loss and exercise are not effective.   If you are 55 to 42   years old, ask your caregiver if you should take aspirin to prevent strokes.   Diabetes screening involves taking a blood sample to check your fasting blood sugar level. This should be done once every 3 years, after age 45, if you are within normal weight and without risk factors for diabetes. Testing should be considered at a younger age or be carried out more frequently if you are overweight and have at least 1 risk factor for diabetes.   Breast cancer screening is essential preventive care for women. You should practice  "breast self-awareness." This means understanding the normal appearance and feel of your breasts and may include breast self-examination. Any changes detected, no matter how small, should be reported to a caregiver. Women in their 20s and 30s should have a clinical breast exam (CBE) by a caregiver as part of a regular health exam every 1 to 3 years. After age 40, women should have a CBE every year. Starting at age 40, women should consider having a mammography (breast X-ray test) every year. Women who have a family history of breast cancer should talk to their caregiver about genetic screening. Women at a high risk of breast cancer should talk to their caregivers about having magnetic resonance imaging (MRI) and a mammography every year.   The Pap test is a screening test for cervical cancer. A Pap test can show cell changes on the cervix that might become cervical cancer if left untreated. A Pap test is a procedure in which cells are obtained and examined from the lower end of the uterus (cervix).   Women should have a Pap test starting at age 21.   Between ages 21 and 29, Pap tests should be repeated every 2 years.   Beginning at age 30, you should have a Pap test every 3 years as long as the past 3 Pap tests have been normal.   Some women have medical problems that increase the chance of getting cervical cancer. Talk to your caregiver about these problems. It is especially important to talk to your caregiver if a new problem develops soon after your last Pap test. In these cases, your caregiver may recommend more frequent screening and Pap tests.   The above recommendations are the same for women who have or have not gotten the vaccine for human papillomavirus (HPV).   If you had a hysterectomy for a problem that was not cancer or a condition that could lead to cancer, then you no longer need Pap tests. Even if you no longer need a Pap test, a regular exam is a good idea to make sure no other  problems are starting.   If you are between ages 65 and 70, and you have had normal Pap tests going back 10 years, you no longer need Pap tests. Even if you no longer need a Pap test, a regular exam is a good idea to make sure no other problems are starting.   If you have had past treatment for cervical cancer or a condition that could lead to cancer, you need Pap tests and screening for cancer for at least 20 years after your treatment.   If Pap tests have been discontinued, risk factors (such as a new sexual partner) need to be reassessed to determine if screening should be resumed.   The HPV test is an additional test that may be used for cervical cancer screening. The HPV test looks for the virus that can cause the cell changes on the cervix.   The cells collected during the Pap test can be tested for HPV. The HPV test could be used to screen women aged 30 years and older, and should be used in women of any age who have unclear Pap test results. After the age of 30, women should have HPV testing at the same frequency as a Pap test.   Colorectal cancer can be detected and often prevented. Most routine colorectal cancer screening begins at the age of 50 and continues through age 75. However, your caregiver may recommend screening at an earlier age if you have risk factors for colon cancer. On a yearly basis, your caregiver may provide home test kits to check for hidden blood in the stool. Use of a small camera at the end of a tube, to directly examine the colon (sigmoidoscopy or colonoscopy), can detect the earliest forms of colorectal cancer. Talk to your caregiver about this at age 50, when routine screening begins. Direct examination of the colon should be repeated every 5 to 10 years through age 75, unless early forms of pre-cancerous polyps or small growths are found.   Hepatitis C blood testing is recommended for all people born from 1945 through 1965 and any individual with known risks for  hepatitis C.   Practice safe sex. Use condoms and avoid high-risk sexual practices to reduce the spread of sexually transmitted infections (STIs). STIs include gonorrhea, chlamydia, syphilis, trichomonas, herpes, HPV, and human immunodeficiency virus (HIV). Herpes, HIV, and HPV are viral illnesses that have no cure. They can result in disability, cancer, and death. Sexually active women aged 25 and younger should be checked for chlamydia. Older women with new or multiple partners should also be tested for chlamydia. Testing for other STIs is recommended if you are sexually active and at increased risk.   Osteoporosis is a disease in which the bones lose minerals and strength with aging. This can result in serious bone fractures. The risk of osteoporosis can be identified using a bone density scan. Women ages 65 and over and women at risk for fractures or osteoporosis should discuss screening with their caregivers. Ask your caregiver whether you should take a calcium supplement or vitamin D to reduce the rate of osteoporosis.   Menopause can be associated with physical symptoms and risks. Hormone replacement therapy is available to decrease symptoms and risks. You should talk to your caregiver about whether hormone replacement therapy is right for you.   Use sunscreen with sun protection factor (SPF) of 30 or more. Apply sunscreen liberally and repeatedly throughout the day. You should seek shade when your shadow is shorter than you. Protect yourself by wearing long sleeves, pants, a wide-brimmed hat, and sunglasses year round, whenever you are outdoors.   Once a month, do a whole body skin exam, using a mirror to look at the skin on your back. Notify your caregiver of new moles, moles that have irregular borders, moles that are larger than a pencil eraser, or moles that have changed in shape or color.   Stay current with required immunizations.   Influenza. You need a dose every fall (or winter). The  composition of the flu vaccine changes each year, so being vaccinated once is not enough.   Pneumococcal polysaccharide. You need 1 to 2 doses if you smoke cigarettes or if you have certain chronic medical conditions. You need 1 dose at age 65 (or older) if you have never been vaccinated.   Tetanus, diphtheria, pertussis (Tdap, Td). Get 1 dose of   Tdap vaccine if you are younger than age 65, are over 65 and have contact with an infant, are a healthcare worker, are pregnant, or simply want to be protected from whooping cough. After that, you need a Td booster dose every 10 years. Consult your caregiver if you have not had at least 3 tetanus and diphtheria-containing shots sometime in your life or have a deep or dirty wound.   HPV. You need this vaccine if you are a woman age 26 or younger. The vaccine is given in 3 doses over 6 months.   Measles, mumps, rubella (MMR). You need at least 1 dose of MMR if you were born in 1957 or later. You may also need a second dose.   Meningococcal. If you are age 19 to 21 and a first-year college student living in a residence hall, or have one of several medical conditions, you need to get vaccinated against meningococcal disease. You may also need additional booster doses.   Zoster (shingles). If you are age 60 or older, you should get this vaccine.   Varicella (chickenpox). If you have never had chickenpox or you were vaccinated but received only 1 dose, talk to your caregiver to find out if you need this vaccine.   Hepatitis A. You need this vaccine if you have a specific risk factor for hepatitis A virus infection or you simply wish to be protected from this disease. The vaccine is usually given as 2 doses, 6 to 18 months apart.   Hepatitis B. You need this vaccine if you have a specific risk factor for hepatitis B virus infection or you simply wish to be protected from this disease. The vaccine is given in 3 doses, usually over 6 months.  Preventive Services /  Frequency Ages 40 to 64  Blood pressure check.** / Every 1 to 2 years.   Lipid and cholesterol check.** / Every 5 years beginning at age 20.   Clinical breast exam.** / Every year after age 40.   Mammogram.** / Every year beginning at age 40 and continuing for as long as you are in good health. Consult with your caregiver.   Pap test.** / Every 3 years starting at age 30 through age 65 or 70 with a history of 3 consecutive normal Pap tests.   HPV screening.** / Every 3 years from ages 30 through ages 65 to 70 with a history of 3 consecutive normal Pap tests.   Fecal occult blood test (FOBT) of stool. / Every year beginning at age 50 and continuing until age 75. You may not need to do this test if you get a colonoscopy every 10 years.   Flexible sigmoidoscopy or colonoscopy.** / Every 5 years for a flexible sigmoidoscopy or every 10 years for a colonoscopy beginning at age 50 and continuing until age 75.   Hepatitis C blood test.** / For all people born from 1945 through 1965 and any individual with known risks for hepatitis C.   Skin self-exam. / Monthly.   Influenza immunization.** / Every year.   Pneumococcal polysaccharide immunization.** / 1 to 2 doses if you smoke cigarettes or if you have certain chronic medical conditions.   Tetanus, diphtheria, pertussis (Tdap, Td) immunization.** / A one-time dose of Tdap vaccine. After that, you need a Td booster dose every 10 years.   Measles, mumps, rubella (MMR) immunization. / You need at least 1 dose of MMR if you were born in 1957 or later. You may also need a   second dose.   Varicella immunization.** / Consult your caregiver.   Meningococcal immunization.** / Consult your caregiver.   Hepatitis A immunization.** / Consult your caregiver. 2 doses, 6 to 18 months apart.   Hepatitis B immunization.** / Consult your caregiver. 3 doses, usually over 6 months.  ** Family history and personal history of risk and conditions may change  your caregiver's recommendations. Document Released: 02/04/2002 Document Revised: 11/28/2011 Document Reviewed: 05/06/2011 [employer] Patient Information 2012 [employer]. 

## 2012-06-12 NOTE — Addendum Note (Signed)
Addended by: Faythe Casa on: 06/12/2012 12:33 PM   Modules accepted: Orders

## 2012-07-07 ENCOUNTER — Ambulatory Visit (HOSPITAL_COMMUNITY)
Admission: RE | Admit: 2012-07-07 | Discharge: 2012-07-07 | Disposition: A | Discharge status: Home / Self Care | Payer: Medicaid Other | Source: Ambulatory Visit | Attending: Obstetrics & Gynecology | Admitting: Obstetrics & Gynecology

## 2012-07-07 DIAGNOSIS — Z1231 Encounter for screening mammogram for malignant neoplasm of breast: Secondary | ICD-10-CM | POA: Insufficient documentation

## 2012-08-10 ENCOUNTER — Emergency Department (HOSPITAL_COMMUNITY)
Admission: EM | Admit: 2012-08-10 | Discharge: 2012-08-10 | Disposition: A | Discharge status: Home / Self Care | Payer: Medicaid Other | Attending: Emergency Medicine | Admitting: Emergency Medicine

## 2012-08-10 ENCOUNTER — Emergency Department (HOSPITAL_COMMUNITY): Payer: Medicaid Other

## 2012-08-10 ENCOUNTER — Encounter (HOSPITAL_COMMUNITY): Payer: Self-pay | Admitting: *Deleted

## 2012-08-10 DIAGNOSIS — F3289 Other specified depressive episodes: Secondary | ICD-10-CM | POA: Insufficient documentation

## 2012-08-10 DIAGNOSIS — Z833 Family history of diabetes mellitus: Secondary | ICD-10-CM | POA: Insufficient documentation

## 2012-08-10 DIAGNOSIS — Z818 Family history of other mental and behavioral disorders: Secondary | ICD-10-CM | POA: Insufficient documentation

## 2012-08-10 DIAGNOSIS — F17200 Nicotine dependence, unspecified, uncomplicated: Secondary | ICD-10-CM | POA: Insufficient documentation

## 2012-08-10 DIAGNOSIS — F329 Major depressive disorder, single episode, unspecified: Secondary | ICD-10-CM | POA: Insufficient documentation

## 2012-08-10 DIAGNOSIS — Z8249 Family history of ischemic heart disease and other diseases of the circulatory system: Secondary | ICD-10-CM | POA: Insufficient documentation

## 2012-08-10 DIAGNOSIS — R0789 Other chest pain: Secondary | ICD-10-CM

## 2012-08-10 DIAGNOSIS — R071 Chest pain on breathing: Secondary | ICD-10-CM | POA: Insufficient documentation

## 2012-08-10 DIAGNOSIS — Z888 Allergy status to other drugs, medicaments and biological substances status: Secondary | ICD-10-CM | POA: Insufficient documentation

## 2012-08-10 LAB — POCT I-STAT, CHEM 8
Calcium, Ion: 1.24 mmol/L — ABNORMAL HIGH (ref 1.12–1.23)
Creatinine, Ser: 1 mg/dL (ref 0.50–1.10)
Glucose, Bld: 95 mg/dL (ref 70–99)
Hemoglobin: 13.3 g/dL (ref 12.0–15.0)
Sodium: 141 mEq/L (ref 135–145)
TCO2: 25 mmol/L (ref 0–100)

## 2012-08-10 LAB — D-DIMER, QUANTITATIVE: D-Dimer, Quant: 0.47 ug/mL-FEU (ref 0.00–0.48)

## 2012-08-10 MED ORDER — IBUPROFEN 800 MG PO TABS: 800.0000 mg | ORAL_TABLET | Freq: Three times a day (TID) | ORAL | Status: AC

## 2012-08-10 MED ORDER — HYDROCODONE-ACETAMINOPHEN 5-325 MG PO TABS
2.0000 | ORAL_TABLET | Freq: Once | ORAL | Status: AC
  Administered 2012-08-10: 2 via ORAL
  Filled 2012-08-10: qty 2

## 2012-08-10 NOTE — ED Notes (Signed)
Pt states since Tuesday started having left rib pain that radiates to back and states hurt to lay on it or take a deep breath.

## 2012-08-10 NOTE — ED Notes (Signed)
C/o left lower rib area pain since Tuesday. Denies injury, cold, cough, UTI Sx's, n/v/d. Reports increase pain when laying on left side, deep breaths, tender to palpation

## 2012-08-10 NOTE — ED Provider Notes (Signed)
History  This chart was scribed for Glynn Octave, MD by Shari Heritage. The patient was seen in room TR04C/TR04C. Patient's care was started at 1016.     CSN: [id number]  Arrival date & time 08/10/12  1016   First MD Initiated Contact with Patient 08/10/12 1106      Chief Complaint  Patient presents with  . Chest Pain    The history is provided by the patient. No language interpreter was used.   Whitney Benton is a 41 y.o. female who presents to the Emergency Department complaining of left sided chest pain that radiates to her back onset 6 days ago. She describes the pain as sharp and moderate to severe in quality. Patient states that the pain is aggravated she lies on her left side and with deep breathing. Patient denies any abdominal pain. No nausea or vomiting. No cough or SOB. Patient hasn't noticed any rashes on her body. Patient has a history of Cesarean section and abdominal hysterectomy. Patient states that she has never had chicken pox. Patient is a current everyday smoker.   Past Medical History  Diagnosis Date  . Depression     Past Surgical History  Procedure Date  . Cesarean section   . Abdominal hysterectomy 2006    Family History  Problem Relation Age of Onset  . Diabetes Mother   . Mental illness Mother   . Depression Mother   . Hypertension Mother   . Hypertension Father   . Diabetes Father     History  Substance Use Topics  . Smoking status: Current Everyday Smoker -- 0.2 packs/day    Types: Cigarettes  . Smokeless tobacco: Not on file  . Alcohol Use: No    OB History    Grav Para Term Preterm Abortions TAB SAB Ect Mult Living   5 3 3  2  0 2 0 0 3      Review of Systems A complete 10 system review of systems was obtained and all systems are negative except as noted in the HPI and PMH.   Allergies  Aspirin  Home Medications   Current Outpatient Rx  Name Route Sig Dispense Refill  . DULOXETINE HCL 60 MG PO CPEP Oral Take 60 mg by mouth  daily.      . IBUPROFEN 800 MG PO TABS Oral Take 800 mg by mouth every 8 (eight) hours as needed. For pain      BP 117/69  Pulse 72  Temp 98.5 F (36.9 C) (Oral)  Resp 20  SpO2 97%  Physical Exam  Constitutional: She is oriented to person, place, and time. She appears well-developed and well-nourished.  HENT:  Head: Normocephalic and atraumatic.  Cardiovascular: Normal rate and regular rhythm.   Pulmonary/Chest: Effort normal and breath sounds normal. No respiratory distress. She has no wheezes. She has no rales.       Reproducible left chest wall tenderness. No ecchymosis. No rash.   Neurological: She is alert and oriented to person, place, and time.  Skin: Skin is warm and dry. No rash noted.  Psychiatric: She has a normal mood and affect. Her behavior is normal.    ED Course  Procedures (including critical care time) DIAGNOSTIC STUDIES: Oxygen Saturation is 97% on room air, adequate by my interpretation.    COORDINATION OF CARE: 11:16am- Patient informed of current plan for treatment and evaluation and agrees with plan at this time.  Results for orders placed during the hospital encounter of 08/10/12  D-DIMER,  QUANTITATIVE      Component Value Range   D-Dimer, Quant 0.47  0.00 - 0.48 ug/mL-FEU  POCT I-STAT, CHEM 8      Component Value Range   Sodium 141  135 - 145 mEq/L   Potassium 4.0  3.5 - 5.1 mEq/L   Chloride 105  96 - 112 mEq/L   BUN 13  6 - 23 mg/dL   Creatinine, Ser 1.61  0.50 - 1.10 mg/dL   Glucose, Bld 95  70 - 99 mg/dL   Calcium, Ion 0.96 (*) 1.12 - 1.23 mmol/L   TCO2 25  0 - 100 mmol/L   Hemoglobin 13.3  12.0 - 15.0 g/dL   HCT 04.5  40.9 - 81.1 %    Dg Chest 2 View  08/10/2012  *RADIOLOGY REPORT*  Clinical Data: Left chest pain with inspiration  CHEST - 2 VIEW  Comparison: 10/02/2010  Findings: Heart size is normal.  Mediastinal shadows are normal. Lungs are clear.  No effusions.  No bony abnormalities.  IMPRESSION: Normal chest   Original Report  Authenticated By: Thomasenia Sales, M.D. ( 08/10/2012 11:09:45 )      No diagnosis found.    MDM  Reproducible left lower rib pain that radiates to back. No shortness of breath, no anterior chest pain, no cough or fever. No birth control use.  Chest x-ray negative. No rash. D-dimer negative.   Date: 08/10/2012  Rate: 62  Rhythm: normal sinus rhythm  QRS Axis: normal  Intervals: normal  ST/T Wave abnormalities: normal  Conduction Disutrbances:none  Narrative Interpretation:   Old EKG Reviewed: none available      I personally performed the services described in this documentation, which was scribed in my presence.  The recorded information has been reviewed and considered.   Glynn Octave, MD 08/10/12 [phone]

## 2012-10-16 ENCOUNTER — Encounter (HOSPITAL_COMMUNITY): Payer: Self-pay | Admitting: Emergency Medicine

## 2012-10-16 ENCOUNTER — Emergency Department (HOSPITAL_COMMUNITY)
Admission: EM | Admit: 2012-10-16 | Discharge: 2012-10-16 | Disposition: A | Discharge status: Home / Self Care | Payer: Medicaid Other | Attending: Emergency Medicine | Admitting: Emergency Medicine

## 2012-10-16 DIAGNOSIS — F3289 Other specified depressive episodes: Secondary | ICD-10-CM | POA: Insufficient documentation

## 2012-10-16 DIAGNOSIS — B029 Zoster without complications: Secondary | ICD-10-CM | POA: Insufficient documentation

## 2012-10-16 DIAGNOSIS — F329 Major depressive disorder, single episode, unspecified: Secondary | ICD-10-CM | POA: Insufficient documentation

## 2012-10-16 DIAGNOSIS — F17200 Nicotine dependence, unspecified, uncomplicated: Secondary | ICD-10-CM | POA: Insufficient documentation

## 2012-10-16 MED ORDER — HYDROCODONE-ACETAMINOPHEN 5-325 MG PO TABS: 2.0000 | ORAL_TABLET | Freq: Four times a day (QID) | ORAL | Status: DC | PRN

## 2012-10-16 MED ORDER — OXYCODONE-ACETAMINOPHEN 5-325 MG PO TABS: 1.0000 | ORAL_TABLET | Freq: Four times a day (QID) | ORAL | Status: DC | PRN

## 2012-10-16 NOTE — ED Provider Notes (Signed)
History  Scribed for [employer]. Bernette Mayers, MD, the patient was seen in room TR10C/TR10C. This chart was scribed by Candelaria Stagers. The patient's care started at 4:44 PM   CSN: [id number]  Arrival date & time 10/16/12  1622   First MD Initiated Contact with Patient 10/16/12 1629      Chief Complaint  Patient presents with  . Rash     The history is provided by the patient. No language interpreter was used.   Whitney Benton is a 41 y.o. female who presents to the Emergency Department complaining of a rash to her upper back and back of neck that started about one week ago.  She also has the rash down her left arm. She states that the rash itches and is painful.  She reports that she has felt soreness and swelling around her lymph nodes at her neck.  She has used hydrocortisone and ibuprofen with no relief.    Past Medical History  Diagnosis Date  . Depression     Past Surgical History  Procedure Date  . Cesarean section   . Abdominal hysterectomy 2006    Family History  Problem Relation Age of Onset  . Diabetes Mother   . Mental illness Mother   . Depression Mother   . Hypertension Mother   . Hypertension Father   . Diabetes Father     History  Substance Use Topics  . Smoking status: Current Every Day Smoker -- 0.2 packs/day    Types: Cigarettes  . Smokeless tobacco: Not on file  . Alcohol Use: No    OB History    Grav Para Term Preterm Abortions TAB SAB Ect Mult Living   5 3 3  2  0 2 0 0 3      Review of Systems  Skin: Positive for rash (to upper back, neck, and left arm).  All other systems reviewed and are negative.    Allergies  Aspirin  Home Medications   Current Outpatient Rx  Name Route Sig Dispense Refill  . DULOXETINE HCL 60 MG PO CPEP Oral Take 60 mg by mouth daily.      . IBUPROFEN 800 MG PO TABS Oral Take 800 mg by mouth every 8 (eight) hours as needed. For pain      BP 134/88  Pulse 91  Temp 99 F (37.2 C) (Oral)  Resp 22  SpO2  100%  Physical Exam  Nursing note and vitals reviewed. Constitutional: She is oriented to person, place, and time. She appears well-developed and well-nourished.  HENT:  Head: Normocephalic and atraumatic.  Eyes: EOM are normal. Pupils are equal, round, and reactive to light.  Neck: Normal range of motion. Neck supple.  Cardiovascular: Normal rate, normal heart sounds and intact distal pulses.   Pulmonary/Chest: Effort normal and breath sounds normal.  Abdominal: Bowel sounds are normal. She exhibits no distension. There is no tenderness.  Musculoskeletal: Normal range of motion. She exhibits no edema and no tenderness.  Neurological: She is alert and oriented to person, place, and time. She has normal strength. No cranial nerve deficit or sensory deficit.  Skin: Skin is warm and dry. No rash noted.       Multiple vesicular rash of the left neck and shoulder that is dry and crusted.    Psychiatric: She has a normal mood and affect.    ED Course  Procedures  DIAGNOSTIC STUDIES:     COORDINATION OF CARE:  4:50PM Will treat for shingles.  Pt  understands and agrees.     Labs Reviewed - No data to display No results found.   No diagnosis found.    MDM  Rash consistent with shingles, however patient states she 'cannot get chickenpox'. Regardless she is a week from symptom onset and not in the treatment window for antivirals.   I personally performed the services described in the documentation, which were scribed in my presence. The recorded information has been reviewed and considered.           Charles B. Bernette Mayers, MD 10/16/12 [id number]

## 2012-10-16 NOTE — ED Notes (Addendum)
Pt reports having rash to back of neck and to back, noticed last Friday; pt reports itching and pain to rash; has been using hydrocortisone; took ibuprofen about 1130-12, and pain has gone away

## 2012-10-21 ENCOUNTER — Emergency Department (HOSPITAL_COMMUNITY)
Admission: EM | Admit: 2012-10-21 | Discharge: 2012-10-21 | Disposition: A | Discharge status: Home / Self Care | Payer: Medicaid Other | Attending: Emergency Medicine | Admitting: Emergency Medicine

## 2012-10-21 ENCOUNTER — Encounter (HOSPITAL_COMMUNITY): Payer: Self-pay | Admitting: Emergency Medicine

## 2012-10-21 DIAGNOSIS — Z79899 Other long term (current) drug therapy: Secondary | ICD-10-CM | POA: Insufficient documentation

## 2012-10-21 DIAGNOSIS — F3289 Other specified depressive episodes: Secondary | ICD-10-CM | POA: Insufficient documentation

## 2012-10-21 DIAGNOSIS — F17200 Nicotine dependence, unspecified, uncomplicated: Secondary | ICD-10-CM | POA: Insufficient documentation

## 2012-10-21 DIAGNOSIS — B029 Zoster without complications: Secondary | ICD-10-CM

## 2012-10-21 DIAGNOSIS — F329 Major depressive disorder, single episode, unspecified: Secondary | ICD-10-CM | POA: Insufficient documentation

## 2012-10-21 MED ORDER — HYDROCODONE-ACETAMINOPHEN 5-500 MG PO TABS: 1.0000 | ORAL_TABLET | Freq: Four times a day (QID) | ORAL | Status: DC | PRN

## 2012-10-21 MED ORDER — ACYCLOVIR 400 MG PO TABS: 800.0000 mg | ORAL_TABLET | Freq: Every day | ORAL | Status: DC

## 2012-10-21 MED ORDER — PREDNISONE 20 MG PO TABS: 40.0000 mg | ORAL_TABLET | Freq: Every day | ORAL | Status: DC

## 2012-10-21 NOTE — ED Provider Notes (Signed)
History   This chart was scribed for Tobin Chad, MD by Albertha Ghee Rifaie. This patient was seen in room TR09C/TR09C and the patient's care was started at 12:42PM.   CSN: [id number]  Arrival date & time 10/21/12  1225   First MD Initiated Contact with Patient 10/21/12 1242      Chief Complaint  Patient presents with  . Rash     The history is provided by the patient. No language interpreter was used.   Whitney Benton is a 41 y.o. female who presents to the Emergency Department complaining of 5 days of gradual onset, gradually worsening, raised rash filled with pus located on the left upper back and shoulder. Pt denies having chickenpox. She denies taking any OTC for the rash and it is not alleviated by anything. She denies fever, chills, emesis, and nausea. She is diagnosed with depression and takes Cymbalta for it. Pt is a current everyday smoker and occasional alcohol user.   Past Medical History  Diagnosis Date  . Depression     Past Surgical History  Procedure Date  . Cesarean section   . Abdominal hysterectomy 2006    Family History  Problem Relation Age of Onset  . Diabetes Mother   . Mental illness Mother   . Depression Mother   . Hypertension Mother   . Hypertension Father   . Diabetes Father     History  Substance Use Topics  . Smoking status: Current Every Day Smoker -- 0.2 packs/day    Types: Cigarettes  . Smokeless tobacco: Not on file  . Alcohol Use: Yes    OB History    Grav Para Term Preterm Abortions TAB SAB Ect Mult Living   5 3 3  2  0 2 0 0 3      Review of Systems  Constitutional: Negative.   HENT: Negative.   Eyes: Negative.   Respiratory: Negative.   Cardiovascular: Negative.   Gastrointestinal: Negative.   Genitourinary: Negative.   Musculoskeletal: Negative.   Skin: Positive for rash (left shoulder and left upper back).  Neurological: Negative.   Hematological: Negative.   Psychiatric/Behavioral: Negative.   All other  systems reviewed and are negative.    Allergies  Aspirin  Home Medications   Current Outpatient Rx  Name Route Sig Dispense Refill  . DULOXETINE HCL 60 MG PO CPEP Oral Take 60 mg by mouth daily.      Marland Kitchen HYDROCODONE-ACETAMINOPHEN 5-325 MG PO TABS Oral Take 2 tablets by mouth every 6 (six) hours as needed for pain. 30 tablet 0  . HYDROCORTISONE 0.5 % EX CREA Topical Apply 1 application topically daily as needed.    . IBUPROFEN 800 MG PO TABS Oral Take 800 mg by mouth every 8 (eight) hours as needed. For pain      BP 128/98  Pulse 84  Temp 98.5 F (36.9 C) (Oral)  Resp 16  Ht 5\' 3"  (1.6 m)  Wt 197 lb (89.359 kg)  BMI 34.90 kg/m2  SpO2 97%  Physical Exam  Nursing note and vitals reviewed. Constitutional: She is oriented to person, place, and time. She appears well-developed and well-nourished. No distress.  HENT:  Head: Normocephalic and atraumatic.  Right Ear: External ear normal.  Left Ear: External ear normal.  Mouth/Throat: Oropharynx is clear and moist. No oropharyngeal exudate.  Eyes: Conjunctivae normal are normal. Pupils are equal, round, and reactive to light. Right eye exhibits no discharge. No scleral icterus.  Neck: Normal range of motion.  Neck supple. No JVD present. No tracheal deviation present.  Cardiovascular: Normal rate, regular rhythm, normal heart sounds and intact distal pulses.  Exam reveals no gallop.   No murmur heard. Pulmonary/Chest: Effort normal and breath sounds normal. No stridor. No respiratory distress. She has no wheezes. She has no rales. She exhibits no tenderness.  Abdominal: Soft. Bowel sounds are normal. She exhibits no distension and no mass. There is no tenderness. There is no rebound and no guarding.  Musculoskeletal: Normal range of motion. She exhibits no edema and no tenderness.  Lymphadenopathy:    She has no cervical adenopathy.  Neurological: She is alert and oriented to person, place, and time. No cranial nerve deficit.  Skin:  Skin is warm and dry. Rash noted. Rash is pustular and vesicular. She is not diaphoretic. No cyanosis. Nails show no clubbing.          Note a vesicular/pustular rash following left C6 dermatome. rash extends to the shoulder. Don't no lesions on the back of the head or face. Note no eye changes or abnormal sclera. Note no similar lesions on the other areas of exposed skin.  Psychiatric: She has a normal mood and affect. Her behavior is normal.    ED Course  Procedures (including critical care time)  Labs Reviewed - No data to display No results found.  DIAGNOSTIC STUDIES: Oxygen Saturation is 97% on room air, adequatre by my interpretation.    COORDINATION OF CARE: 1:00 PM Discussed treatment plan with pt at bedside and pt agreed to plan.    No diagnosis found.    MDM  Patient presents for evaluation of a rash on her upper back. Spent ongoing for about 4 days. It appears vesicular in nature consistent with shingles. She states the area that the rash is located and is also painful. She denies any history of being immunocompromised, has never had chickenpox, and denies any evidence of systemic illness. She has been using hydrocortisone cream without any relief in her symptoms. Will at this time treat her for shingles with a seven-day course of acyclovir as well as a steroid taper. Will provide pain medication also.     I personally performed the services described in this documentation, which was scribed in my presence. The recorded information has been reviewed and considered.     Tobin Chad, MD 10/21/12 1315

## 2012-10-21 NOTE — ED Notes (Signed)
Patient claims that she has had a raised rash with pus filled bumps on upper back.

## 2012-10-21 NOTE — ED Notes (Signed)
Patient complains of rash on back.  "It looks like pimples".

## 2012-11-21 ENCOUNTER — Encounter (HOSPITAL_COMMUNITY): Payer: Self-pay | Admitting: *Deleted

## 2012-11-21 ENCOUNTER — Emergency Department (HOSPITAL_COMMUNITY)
Admission: EM | Admit: 2012-11-21 | Discharge: 2012-11-21 | Disposition: A | Discharge status: Home / Self Care | Payer: Medicaid Other | Attending: Emergency Medicine | Admitting: Emergency Medicine

## 2012-11-21 DIAGNOSIS — R109 Unspecified abdominal pain: Secondary | ICD-10-CM | POA: Insufficient documentation

## 2012-11-21 DIAGNOSIS — Z79899 Other long term (current) drug therapy: Secondary | ICD-10-CM | POA: Insufficient documentation

## 2012-11-21 DIAGNOSIS — F17200 Nicotine dependence, unspecified, uncomplicated: Secondary | ICD-10-CM | POA: Insufficient documentation

## 2012-11-21 DIAGNOSIS — F3289 Other specified depressive episodes: Secondary | ICD-10-CM | POA: Insufficient documentation

## 2012-11-21 DIAGNOSIS — K649 Unspecified hemorrhoids: Secondary | ICD-10-CM

## 2012-11-21 DIAGNOSIS — K921 Melena: Secondary | ICD-10-CM | POA: Insufficient documentation

## 2012-11-21 DIAGNOSIS — F329 Major depressive disorder, single episode, unspecified: Secondary | ICD-10-CM | POA: Insufficient documentation

## 2012-11-21 LAB — COMPREHENSIVE METABOLIC PANEL
ALT: 10 U/L (ref 0–35)
AST: 18 U/L (ref 0–37)
Calcium: 8.8 mg/dL (ref 8.4–10.5)
Sodium: 136 mEq/L (ref 135–145)
Total Protein: 5.9 g/dL — ABNORMAL LOW (ref 6.0–8.3)

## 2012-11-21 LAB — TYPE AND SCREEN: Antibody Screen: NEGATIVE

## 2012-11-21 LAB — URINALYSIS, ROUTINE W REFLEX MICROSCOPIC
Ketones, ur: 15 mg/dL — AB
Leukocytes, UA: NEGATIVE
Nitrite: NEGATIVE
Specific Gravity, Urine: 1.028 (ref 1.005–1.030)
Urobilinogen, UA: 1 mg/dL (ref 0.0–1.0)
pH: 6 (ref 5.0–8.0)

## 2012-11-21 LAB — CBC
MCH: 29.2 pg (ref 26.0–34.0)
MCHC: 34.5 g/dL (ref 30.0–36.0)
Platelets: 378 10*3/uL (ref 150–400)
RDW: 13.6 % (ref 11.5–15.5)

## 2012-11-21 LAB — ABO/RH: ABO/RH(D): O POS

## 2012-11-21 MED ORDER — METHOCARBAMOL 500 MG PO TABS: 500.0000 mg | ORAL_TABLET | Freq: Two times a day (BID) | ORAL | Status: DC

## 2012-11-21 MED ORDER — DOCUSATE SODIUM 100 MG PO CAPS: 100.0000 mg | ORAL_CAPSULE | Freq: Two times a day (BID) | ORAL | Status: DC

## 2012-11-21 MED ORDER — HYDROCORTISONE ACETATE 25 MG RE SUPP: 25.0000 mg | Freq: Two times a day (BID) | RECTAL | Status: DC

## 2012-11-21 NOTE — ED Provider Notes (Signed)
I saw and evaluated the patient, reviewed the resident's note and I agree with the findings and plan. Pt to be tx for hemmorioids   Toy Baker, MD 11/21/12 [phone]

## 2012-11-21 NOTE — ED Notes (Signed)
Reports having left side pain and had rectal bleeding x 3 yesterday. No acute distress noted at triage.

## 2012-11-21 NOTE — ED Provider Notes (Signed)
History     CSN: [id number]  Arrival date & time 11/21/12  1436   First MD Initiated Contact with Patient 11/21/12 1808      Chief Complaint  Patient presents with  . Rectal Bleeding    (Consider location/radiation/quality/duration/timing/severity/associated sxs/prior treatment) Patient is a 41 y.o. female presenting with hematochezia. The history is provided by the patient.  Rectal Bleeding  The current episode started yesterday. The onset was sudden. The problem occurs occasionally. The problem has been unchanged. The pain is severe. The stool is described as mixed with blood. There was no prior successful therapy. There was no prior unsuccessful therapy. Associated symptoms include abdominal pain (L upper flank). Pertinent negatives include no fever, no nausea, no vaginal bleeding, no vaginal discharge, no coughing and no difficulty breathing. Her past medical history does not include recent abdominal injury, recent antibiotic use or a recent illness.    Past Medical History  Diagnosis Date  . Depression     Past Surgical History  Procedure Date  . Cesarean section   . Abdominal hysterectomy 2006    Family History  Problem Relation Age of Onset  . Diabetes Mother   . Mental illness Mother   . Depression Mother   . Hypertension Mother   . Hypertension Father   . Diabetes Father     History  Substance Use Topics  . Smoking status: Current Every Day Smoker -- 0.2 packs/day    Types: Cigarettes  . Smokeless tobacco: Not on file  . Alcohol Use: Yes    OB History    Grav Para Term Preterm Abortions TAB SAB Ect Mult Living   5 3 3  2  0 2 0 0 3      Review of Systems  Constitutional: Negative for fever.  Respiratory: Negative for cough.   Gastrointestinal: Positive for abdominal pain (L upper flank) and hematochezia. Negative for nausea.  Genitourinary: Negative for vaginal bleeding and vaginal discharge.  All other systems reviewed and are  negative.    Allergies  Aspirin  Home Medications   Current Outpatient Rx  Name  Route  Sig  Dispense  Refill  . DULOXETINE HCL 60 MG PO CPEP   Oral   Take 60 mg by mouth daily.           . IBUPROFEN 800 MG PO TABS   Oral   Take 800 mg by mouth every 8 (eight) hours as needed. For pain           BP 126/71  Pulse 76  Temp 98.9 F (37.2 C) (Oral)  Resp 16  SpO2 100%  Physical Exam  Nursing note and vitals reviewed. Constitutional: She is oriented to person, place, and time. She appears well-developed and well-nourished. No distress.  HENT:  Head: Normocephalic and atraumatic.  Eyes: EOM are normal. Pupils are equal, round, and reactive to light.  Neck: Normal range of motion. Neck supple.  Cardiovascular: Normal rate and regular rhythm.  Exam reveals no friction rub.   No murmur heard. Pulmonary/Chest: Effort normal and breath sounds normal. No respiratory distress. She has no wheezes. She has no rales.  Abdominal: Soft. She exhibits no distension. There is tenderness (mild left CVA tenderness to palpation). There is no rebound.  Genitourinary: Rectal exam shows external hemorrhoid (2 small, nonthrombosed) and tenderness. Rectal exam shows no internal hemorrhoid, no fissure, no mass and anal tone normal. Guaiac negative stool.  Musculoskeletal: Normal range of motion. She exhibits no edema.  Neurological: She  is alert and oriented to person, place, and time.  Skin: She is not diaphoretic.    ED Course  Procedures (including critical care time)  Labs Reviewed  COMPREHENSIVE METABOLIC PANEL - Abnormal; Notable for the following:    Total Protein 5.9 (*)     Albumin 3.1 (*)     GFR calc non Af Amer 71 (*)     GFR calc Af Amer 82 (*)     All other components within normal limits  URINALYSIS, ROUTINE W REFLEX MICROSCOPIC - Abnormal; Notable for the following:    APPearance CLOUDY (*)     Bilirubin Urine SMALL (*)     Ketones, ur 15 (*)     All other components  within normal limits  CBC  TYPE AND SCREEN  OCCULT BLOOD, POC DEVICE  ABO/RH   No results found.   1. Hemorrhoids       MDM   Patient is a 41 year old female who presents with rectal bleeding. Started yesterday. Associated with   moderate anal pain and burning. Patient has history of intermittent constipation which she's on and off her Cymbalta. Patient denies any diarrhea or vomiting. She has mild left CVA tenderness on exam. She denies any dysuria or vaginal bleeding. On exam she has 2 small external nonthrombosed hemorrhoids. Hemoccult is negative. Her UA is negative for infection. Her hemoglobin is normal. I believe patient's bleeding is due to her to hemorrhoids. Only for stool softeners, Anusol, instructions for preparation H., and recommendation for sitz baths. I'll give her PCP followup for the hemorrhoids.       Elwin Mocha, MD 11/21/12 [phone]

## 2012-11-22 NOTE — ED Provider Notes (Signed)
I saw and evaluated the patient, reviewed the resident's note and I agree with the findings and plan.  Halden Phegley T Lael Wetherbee, MD 11/22/12 2217 

## 2013-06-10 ENCOUNTER — Ambulatory Visit (INDEPENDENT_AMBULATORY_CARE_PROVIDER_SITE_OTHER): Payer: Medicaid Other | Admitting: Obstetrics & Gynecology

## 2013-06-10 ENCOUNTER — Encounter: Payer: Self-pay | Admitting: Obstetrics & Gynecology

## 2013-06-10 VITALS — BP 108/80 | HR 79 | Temp 98.8°F | Resp 20 | Ht 63.0 in | Wt 195.2 lb

## 2013-06-10 DIAGNOSIS — Z01419 Encounter for gynecological examination (general) (routine) without abnormal findings: Secondary | ICD-10-CM

## 2013-06-10 DIAGNOSIS — Z90710 Acquired absence of both cervix and uterus: Secondary | ICD-10-CM

## 2013-06-10 DIAGNOSIS — B372 Candidiasis of skin and nail: Secondary | ICD-10-CM

## 2013-06-10 MED ORDER — NYSTATIN 100000 UNIT/GM EX POWD: Freq: Three times a day (TID) | CUTANEOUS | Status: DC

## 2013-06-10 NOTE — Progress Notes (Signed)
Pt reports that she has occasional sharp pain in the vagina which is unrelated to any activity.

## 2013-06-10 NOTE — Patient Instructions (Addendum)
Return to clinic for any scheduled appointments or for any gynecologic concerns as needed.   Preventive Care for Adults, Female A healthy lifestyle and preventive care can promote health and wellness. Preventive health guidelines for women include the following key practices.  A routine yearly physical is a good way to check with your caregiver about your health and preventive screening. It is a chance to share any concerns and updates on your health, and to receive a thorough exam.  Visit your dentist for a routine exam and preventive care every 6 months. Brush your teeth twice a day and floss once a day. Good oral hygiene prevents tooth decay and gum disease.  The frequency of eye exams is based on your age, health, family medical history, use of contact lenses, and other factors. Follow your caregiver's recommendations for frequency of eye exams.  Eat a healthy diet. Foods like vegetables, fruits, whole grains, low-fat dairy products, and lean protein foods contain the nutrients you need without too many calories. Decrease your intake of foods high in solid fats, added sugars, and salt. Eat the right amount of calories for you.Get information about a proper diet from your caregiver, if necessary.  Regular physical exercise is one of the most important things you can do for your health. Most adults should get at least 150 minutes of moderate-intensity exercise (any activity that increases your heart rate and causes you to sweat) each week. In addition, most adults need muscle-strengthening exercises on 2 or more days a week.  Maintain a healthy weight. The body mass index (BMI) is a screening tool to identify possible weight problems. It provides an estimate of body fat based on height and weight. Your caregiver can help determine your BMI, and can help you achieve or maintain a healthy weight.For adults 20 years and older:  A BMI below 18.5 is considered underweight.  A BMI of 18.5 to 24.9 is  normal.  A BMI of 25 to 29.9 is considered overweight.  A BMI of 30 and above is considered obese.  Maintain normal blood lipids and cholesterol levels by exercising and minimizing your intake of saturated fat. Eat a balanced diet with plenty of fruit and vegetables. Blood tests for lipids and cholesterol should begin at age 74 and be repeated every 5 years. If your lipid or cholesterol levels are high, you are over 50, or you are at high risk for heart disease, you may need your cholesterol levels checked more frequently.Ongoing high lipid and cholesterol levels should be treated with medicines if diet and exercise are not effective.  If you smoke, find out from your caregiver how to quit. If you do not use tobacco, do not start.  If you are pregnant, do not drink alcohol. If you are breastfeeding, be very cautious about drinking alcohol. If you are not pregnant and choose to drink alcohol, do not exceed 1 drink per day. One drink is considered to be 12 ounces (355 mL) of beer, 5 ounces (148 mL) of wine, or 1.5 ounces (44 mL) of liquor.  Avoid use of street drugs. Do not share needles with anyone. Ask for help if you need support or instructions about stopping the use of drugs.  High blood pressure causes heart disease and increases the risk of stroke. Your blood pressure should be checked at least every 1 to 2 years. Ongoing high blood pressure should be treated with medicines if weight loss and exercise are not effective.  If you are 55 to  43 years old, ask your caregiver if you should take aspirin to prevent strokes.  Diabetes screening involves taking a blood sample to check your fasting blood sugar level. This should be done once every 3 years, after age 55, if you are within normal weight and without risk factors for diabetes. Testing should be considered at a younger age or be carried out more frequently if you are overweight and have at least 1 risk factor for diabetes.  Breast cancer  screening is essential preventive care for women. You should practice "breast self-awareness." This means understanding the normal appearance and feel of your breasts and may include breast self-examination. Any changes detected, no matter how small, should be reported to a caregiver. Women in their 62s and 30s should have a clinical breast exam (CBE) by a caregiver as part of a regular health exam every 1 to 3 years. After age 42, women should have a CBE every year. Starting at age 68, women should consider having a mammography (breast X-ray test) every year. Women who have a family history of breast cancer should talk to their caregiver about genetic screening. Women at a high risk of breast cancer should talk to their caregivers about having magnetic resonance imaging (MRI) and a mammography every year.  The Pap test is a screening test for cervical cancer. A Pap test can show cell changes on the cervix that might become cervical cancer if left untreated. A Pap test is a procedure in which cells are obtained and examined from the lower end of the uterus (cervix).  Women should have a Pap test starting at age 68.  Between ages 26 and 34, Pap tests should be repeated every 2 years.  Beginning at age 60, you should have a Pap test every 3 years as long as the past 3 Pap tests have been normal.  Some women have medical problems that increase the chance of getting cervical cancer. Talk to your caregiver about these problems. It is especially important to talk to your caregiver if a new problem develops soon after your last Pap test. In these cases, your caregiver may recommend more frequent screening and Pap tests.  The above recommendations are the same for women who have or have not gotten the vaccine for human papillomavirus (HPV).  If you had a hysterectomy for a problem that was not cancer or a condition that could lead to cancer, then you no longer need Pap tests. Even if you no longer need a Pap  test, a regular exam is a good idea to make sure no other problems are starting.  If you are between ages 76 and 8, and you have had normal Pap tests going back 10 years, you no longer need Pap tests. Even if you no longer need a Pap test, a regular exam is a good idea to make sure no other problems are starting.  If you have had past treatment for cervical cancer or a condition that could lead to cancer, you need Pap tests and screening for cancer for at least 20 years after your treatment.  If Pap tests have been discontinued, risk factors (such as a new sexual partner) need to be reassessed to determine if screening should be resumed.  The HPV test is an additional test that may be used for cervical cancer screening. The HPV test looks for the virus that can cause the cell changes on the cervix. The cells collected during the Pap test can be tested for HPV.  The HPV test could be used to screen women aged 36 years and older, and should be used in women of any age who have unclear Pap test results. After the age of 27, women should have HPV testing at the same frequency as a Pap test.  Colorectal cancer can be detected and often prevented. Most routine colorectal cancer screening begins at the age of 74 and continues through age 68. However, your caregiver may recommend screening at an earlier age if you have risk factors for colon cancer. On a yearly basis, your caregiver may provide home test kits to check for hidden blood in the stool. Use of a small camera at the end of a tube, to directly examine the colon (sigmoidoscopy or colonoscopy), can detect the earliest forms of colorectal cancer. Talk to your caregiver about this at age 69, when routine screening begins. Direct examination of the colon should be repeated every 5 to 10 years through age 33, unless early forms of pre-cancerous polyps or small growths are found.  Hepatitis C blood testing is recommended for all people born from [birth year] through  1965 and any individual with known risks for hepatitis C.  Practice safe sex. Use condoms and avoid high-risk sexual practices to reduce the spread of sexually transmitted infections (STIs). STIs include gonorrhea, chlamydia, syphilis, trichomonas, herpes, HPV, and human immunodeficiency virus (HIV). Herpes, HIV, and HPV are viral illnesses that have no cure. They can result in disability, cancer, and death. Sexually active women aged 14 and younger should be checked for chlamydia. Older women with new or multiple partners should also be tested for chlamydia. Testing for other STIs is recommended if you are sexually active and at increased risk.  Osteoporosis is a disease in which the bones lose minerals and strength with aging. This can result in serious bone fractures. The risk of osteoporosis can be identified using a bone density scan. Women ages 93 and over and women at risk for fractures or osteoporosis should discuss screening with their caregivers. Ask your caregiver whether you should take a calcium supplement or vitamin D to reduce the rate of osteoporosis.  Menopause can be associated with physical symptoms and risks. Hormone replacement therapy is available to decrease symptoms and risks. You should talk to your caregiver about whether hormone replacement therapy is right for you.  Use sunscreen with sun protection factor (SPF) of 30 or more. Apply sunscreen liberally and repeatedly throughout the day. You should seek shade when your shadow is shorter than you. Protect yourself by wearing long sleeves, pants, a wide-brimmed hat, and sunglasses year round, whenever you are outdoors.  Once a month, do a whole body skin exam, using a mirror to look at the skin on your back. Notify your caregiver of new moles, moles that have irregular borders, moles that are larger than a pencil eraser, or moles that have changed in shape or color.  Stay current with required immunizations.  Influenza. You  need a dose every fall (or winter). The composition of the flu vaccine changes each year, so being vaccinated once is not enough.  Pneumococcal polysaccharide. You need 1 to 2 doses if you smoke cigarettes or if you have certain chronic medical conditions. You need 1 dose at age 72 (or older) if you have never been vaccinated.  Tetanus, diphtheria, pertussis (Tdap, Td). Get 1 dose of Tdap vaccine if you are younger than age 54, are over 35 and have contact with an infant, are a Research scientist (physical sciences), are  pregnant, or simply want to be protected from whooping cough. After that, you need a Td booster dose every 10 years. Consult your caregiver if you have not had at least 3 tetanus and diphtheria-containing shots sometime in your life or have a deep or dirty wound.  HPV. You need this vaccine if you are a woman age 37 or younger. The vaccine is given in 3 doses over 6 months.  Measles, mumps, rubella (MMR). You need at least 1 dose of MMR if you were born in 1957 or later. You may also need a second dose.  Meningococcal. If you are age 62 to 78 and a first-year college student living in a residence hall, or have one of several medical conditions, you need to get vaccinated against meningococcal disease. You may also need additional booster doses.  Zoster (shingles). If you are age 53 or older, you should get this vaccine.  Varicella (chickenpox). If you have never had chickenpox or you were vaccinated but received only 1 dose, talk to your caregiver to find out if you need this vaccine.  Hepatitis A. You need this vaccine if you have a specific risk factor for hepatitis A virus infection or you simply wish to be protected from this disease. The vaccine is usually given as 2 doses, 6 to 18 months apart.  Hepatitis B. You need this vaccine if you have a specific risk factor for hepatitis B virus infection or you simply wish to be protected from this disease. The vaccine is given in 3 doses, usually over 6  months. Preventive Services / Frequency Ages 35 to 44  Blood pressure check.** / Every 1 to 2 years.  Lipid and cholesterol check.** / Every 5 years beginning at age 29.  Clinical breast exam.** / Every 3 years for women in their 17s and 30s.  Pap test.** / Every 2 years from ages 57 through 13. Every 3 years starting at age 85 through age 80 or 5 with a history of 3 consecutive normal Pap tests.  HPV screening.** / Every 3 years from ages 27 through ages 11 to 73 with a history of 3 consecutive normal Pap tests.  Hepatitis C blood test.** / For any individual with known risks for hepatitis C.  Skin self-exam. / Monthly.  Influenza immunization.** / Every year.  Pneumococcal polysaccharide immunization.** / 1 to 2 doses if you smoke cigarettes or if you have certain chronic medical conditions.  Tetanus, diphtheria, pertussis (Tdap, Td) immunization. / A one-time dose of Tdap vaccine. After that, you need a Td booster dose every 10 years.  HPV immunization. / 3 doses over 6 months, if you are 106 and younger.  Measles, mumps, rubella (MMR) immunization. / You need at least 1 dose of MMR if you were born in 1957 or later. You may also need a second dose.  Meningococcal immunization. / 1 dose if you are age 24 to 79 and a first-year college student living in a residence hall, or have one of several medical conditions, you need to get vaccinated against meningococcal disease. You may also need additional booster doses.  Varicella immunization.** / Consult your caregiver.  Hepatitis A immunization.** / Consult your caregiver. 2 doses, 6 to 18 months apart.  Hepatitis B immunization.** / Consult your caregiver. 3 doses usually over 6 months. Ages 58 to 66  Blood pressure check.** / Every 1 to 2 years.  Lipid and cholesterol check.** / Every 5 years beginning at age 108.  Clinical breast  exam.** / Every year after age 43.  Mammogram.** / Every year beginning at age 59 and  continuing for as long as you are in good health. Consult with your caregiver.  Pap test.** / Every 3 years starting at age 48 through age 7 or 20 with a history of 3 consecutive normal Pap tests.  HPV screening.** / Every 3 years from ages 16 through ages 39 to 55 with a history of 3 consecutive normal Pap tests.  Fecal occult blood test (FOBT) of stool. / Every year beginning at age 96 and continuing until age 59. You may not need to do this test if you get a colonoscopy every 10 years.  Flexible sigmoidoscopy or colonoscopy.** / Every 5 years for a flexible sigmoidoscopy or every 10 years for a colonoscopy beginning at age 46 and continuing until age 73.  Hepatitis C blood test.** / For all people born from [birth year] through 1965 and any individual with known risks for hepatitis C.  Skin self-exam. / Monthly.  Influenza immunization.** / Every year.  Pneumococcal polysaccharide immunization.** / 1 to 2 doses if you smoke cigarettes or if you have certain chronic medical conditions.  Tetanus, diphtheria, pertussis (Tdap, Td) immunization.** / A one-time dose of Tdap vaccine. After that, you need a Td booster dose every 10 years.  Measles, mumps, rubella (MMR) immunization. / You need at least 1 dose of MMR if you were born in 1957 or later. You may also need a second dose.  Varicella immunization.** / Consult your caregiver.  Meningococcal immunization.** / Consult your caregiver.  Hepatitis A immunization.** / Consult your caregiver. 2 doses, 6 to 18 months apart.  Hepatitis B immunization.** / Consult your caregiver. 3 doses, usually over 6 months. Ages 22 and over  Blood pressure check.** / Every 1 to 2 years.  Lipid and cholesterol check.** / Every 5 years beginning at age 59.  Clinical breast exam.** / Every year after age 64.  Mammogram.** / Every year beginning at age 33 and continuing for as long as you are in good health. Consult with your caregiver.  Pap test.** / Every  3 years starting at age 56 through age 61 or 76 with a 3 consecutive normal Pap tests. Testing can be stopped between 65 and 70 with 3 consecutive normal Pap tests and no abnormal Pap or HPV tests in the past 10 years.  HPV screening.** / Every 3 years from ages 25 through ages 7 or 87 with a history of 3 consecutive normal Pap tests. Testing can be stopped between 65 and 70 with 3 consecutive normal Pap tests and no abnormal Pap or HPV tests in the past 10 years.  Fecal occult blood test (FOBT) of stool. / Every year beginning at age 43 and continuing until age 27. You may not need to do this test if you get a colonoscopy every 10 years.  Flexible sigmoidoscopy or colonoscopy.** / Every 5 years for a flexible sigmoidoscopy or every 10 years for a colonoscopy beginning at age 64 and continuing until age 17.  Hepatitis C blood test.** / For all people born from [birth year] through 1966 and any individual with known risks for hepatitis C.  Osteoporosis screening.** / A one-time screening for women ages 64 and over and women at risk for fractures or osteoporosis.  Skin self-exam. / Monthly.  Influenza immunization.** / Every year.  Pneumococcal polysaccharide immunization.** / 1 dose at age 56 (or older) if you have never been vaccinated.  Tetanus,  diphtheria, pertussis (Tdap, Td) immunization. / A one-time dose of Tdap vaccine if you are over 65 and have contact with an infant, are a Research scientist (physical sciences), or simply want to be protected from whooping cough. After that, you need a Td booster dose every 10 years.  Varicella immunization.** / Consult your caregiver.  Meningococcal immunization.** / Consult your caregiver.  Hepatitis A immunization.** / Consult your caregiver. 2 doses, 6 to 18 months apart.  Hepatitis B immunization.** / Check with your caregiver. 3 doses, usually over 6 months. ** Family history and personal history of risk and conditions may change your caregiver's  recommendations. Document Released: 02/04/2002 Document Revised: 03/02/2012 Document Reviewed: 05/06/2011 [facility] Patient Information 2014 [place], [place].

## 2013-06-10 NOTE — Progress Notes (Signed)
Subjective:    Whitney Benton is a 42 y.o. [phone] female and is here for annual gynecologic physical exam. She is s/p TVH for fibroids on 09/16/05. The patient reports no problems, occasional vaginal dryness alleviated with Rephresh vaginal lubricant.  Having some pain occasionally around her inner thigh panty lines, she attributes this to weight gain.  No other gynecologic problems.   The following portions of the patient's history were reviewed and updated as appropriate: allergies, current medications, past family history, past medical history, past social history, past surgical history and problem list.   Review of Systems  A comprehensive review of systems was negative.   Objective:   BP 108/80  Pulse 79  Temp(Src) 98.8 F (37.1 C) (Oral)  Resp 20  Ht 5\' 3"  (1.6 m)  Wt 195 lb 3.2 oz (88.542 kg)  BMI 34.59 kg/m2 GENERAL: Well-developed, well-nourished female in no acute distress.  HEENT: Normocephalic, atraumatic. Sclerae anicteric.  NECK: Supple. Normal thyroid.  LUNGS: Clear to auscultation bilaterally.  HEART: Regular rate and rhythm.  BREASTS: Symmetric with everted nipples. Some erythema noted under the breasts bilaterally.  No masses, other skin changes, nipple drainage, or lymphadenopathy.  ABDOMEN: Soft, nontender, nondistended. No organomegaly.  PELVIC: Normal external female genitalia. Vagina is pink and rugated. Normal discharge. Well-healed vaginal cuff. No adnexal mass or tenderness. Inner thigh/mons junctions without any erythema.   EXTREMITIES: No cyanosis, clubbing, or edema, 2+ distal pulses.   Assessment:    Healthy female exam.  Candidal skin infections  Plan:    Nystatin powder ordered for candidal skin infection.  Encouraged weight loss. Mammogram ordered  Routine preventative health maintenance measures emphasized

## 2013-06-23 ENCOUNTER — Encounter (HOSPITAL_COMMUNITY): Payer: Self-pay | Admitting: Emergency Medicine

## 2013-06-23 ENCOUNTER — Emergency Department (HOSPITAL_COMMUNITY)
Admission: EM | Admit: 2013-06-23 | Discharge: 2013-06-23 | Disposition: A | Discharge status: Home / Self Care | Payer: Medicaid Other | Attending: Emergency Medicine | Admitting: Emergency Medicine

## 2013-06-23 DIAGNOSIS — R259 Unspecified abnormal involuntary movements: Secondary | ICD-10-CM | POA: Insufficient documentation

## 2013-06-23 DIAGNOSIS — R599 Enlarged lymph nodes, unspecified: Secondary | ICD-10-CM | POA: Insufficient documentation

## 2013-06-23 DIAGNOSIS — F329 Major depressive disorder, single episode, unspecified: Secondary | ICD-10-CM | POA: Insufficient documentation

## 2013-06-23 DIAGNOSIS — F17200 Nicotine dependence, unspecified, uncomplicated: Secondary | ICD-10-CM | POA: Insufficient documentation

## 2013-06-23 DIAGNOSIS — M5380 Other specified dorsopathies, site unspecified: Secondary | ICD-10-CM | POA: Insufficient documentation

## 2013-06-23 DIAGNOSIS — M62830 Muscle spasm of back: Secondary | ICD-10-CM

## 2013-06-23 DIAGNOSIS — F3289 Other specified depressive episodes: Secondary | ICD-10-CM | POA: Insufficient documentation

## 2013-06-23 DIAGNOSIS — Z79899 Other long term (current) drug therapy: Secondary | ICD-10-CM | POA: Insufficient documentation

## 2013-06-23 MED ORDER — METHOCARBAMOL 500 MG PO TABS: 500.0000 mg | ORAL_TABLET | Freq: Every day | ORAL | Status: DC | PRN

## 2013-06-23 MED ORDER — IBUPROFEN 800 MG PO TABS: 800.0000 mg | ORAL_TABLET | Freq: Three times a day (TID) | ORAL | Status: DC | PRN

## 2013-06-23 NOTE — ED Notes (Signed)
Pt c/o pain in upper back with possible muscle spasm; pt sts glands in neck swollen

## 2013-06-23 NOTE — ED Provider Notes (Signed)
Medical screening examination/treatment/procedure(s) were performed by non-physician practitioner and as supervising physician I was immediately available for consultation/collaboration.   Velinda Wrobel H Hiep Ollis, MD 06/23/13 [id number] 

## 2013-06-23 NOTE — ED Provider Notes (Signed)
History    CSN: [id number] Arrival date & time 06/23/13  1340  None    Chief Complaint  Patient presents with  . Back Pain  . Lymphadenopathy   (Consider location/radiation/quality/duration/timing/severity/associated sxs/prior Treatment) HPI Comments: Patient is a 42 year old female who presents for upper back pain x2 days. Patient states that pain is sharp in nature and radiating out to her bilateral shoulders as well as down her back.  The symptoms have been constant since onset without any alleviating factors. Patient states that pain is aggravated when elevating her bilateral arms for long periods of time. Patient denies fevers, falls or trauma, neck immobility, numbness/tingling, and extremity weakness.  Patient is a 42 y.o. female presenting with back pain. The history is provided by the patient. No language interpreter was used.  Back Pain Associated symptoms: no chest pain, no fever, no numbness and no weakness    Past Medical History  Diagnosis Date  . Depression    Past Surgical History  Procedure Laterality Date  . Cesarean section    . Vaginal hysterectomy  2006    Fibroids, menorrhagia, benign pathology   Family History  Problem Relation Age of Onset  . Diabetes Mother   . Mental illness Mother   . Depression Mother   . Hypertension Mother   . Hypertension Father   . Diabetes Father    History  Substance Use Topics  . Smoking status: Current Every Day Smoker -- 0.25 packs/day for 8 years    Types: Cigarettes  . Smokeless tobacco: Not on file  . Alcohol Use: Yes     Comment: occasional   OB History   Grav Para Term Preterm Abortions TAB SAB Ect Mult Living   5 3 3  2  0 2 0 0 3     Review of Systems  Constitutional: Negative for fever.  Respiratory: Negative for shortness of breath.   Cardiovascular: Negative for chest pain.  Musculoskeletal: Positive for back pain.  Skin: Negative for color change and wound.  Neurological: Negative for weakness  and numbness.  All other systems reviewed and are negative.    Allergies  Aspirin  Home Medications   Current Outpatient Rx  Name  Route  Sig  Dispense  Refill  . nystatin (MYCOSTATIN) powder   Topical   Apply topically 3 (three) times daily.   60 g   5   . sertraline (ZOLOFT) 50 MG tablet   Oral   Take 50 mg by mouth daily.         Marland Kitchen ibuprofen (ADVIL,MOTRIN) 800 MG tablet   Oral   Take 1 tablet (800 mg total) by mouth every 8 (eight) hours as needed. For pain   30 tablet   0   . methocarbamol (ROBAXIN) 500 MG tablet   Oral   Take 1 tablet (500 mg total) by mouth daily as needed (for tight muscles, muscle pain).   20 tablet   0    BP 112/79  Pulse 85  Temp(Src) 98.5 F (36.9 C) (Oral)  Resp 16  SpO2 96% Physical Exam  Nursing note and vitals reviewed. Constitutional: She is oriented to person, place, and time. She appears well-developed and well-nourished. No distress.  HENT:  Head: Normocephalic and atraumatic.  Eyes: Conjunctivae and EOM are normal. No scleral icterus.  Neck: Normal range of motion. Neck supple.  Normal ROM of neck and 5/5 strength against resistance of SCM muscles b/l  Cardiovascular: Normal rate, regular rhythm, normal heart sounds and  intact distal pulses.   Distal radial pulses 2+ bilaterally  Pulmonary/Chest: Effort normal and breath sounds normal. No respiratory distress. She has no wheezes. She has no rales.  Musculoskeletal: Normal range of motion. She exhibits no edema.       Right shoulder: Normal.       Left shoulder: Normal.       Cervical back: She exhibits tenderness and spasm. She exhibits normal range of motion, no bony tenderness, no swelling, no deformity and no pain.       Back:  No palpable step-offs or deformities appreciated  Lymphadenopathy:    She has no cervical adenopathy.  Neurological: She is alert and oriented to person, place, and time.  Patient exhibits equal grip strength bilaterally with 5 out of 5  strength against resistance in her upper extremities. DTRs normal and symmetric.  Skin: Skin is warm and dry. No rash noted. She is not diaphoretic. No erythema. No pallor.  Psychiatric: She has a normal mood and affect. Her behavior is normal.    ED Course  Procedures (including critical care time) Labs Reviewed - No data to display No results found.  1. Muscle spasm of back     MDM  Uncomplicated muscle spasm of upper back. Physical exam as above. No red flags and patient with equal grip strength in upper extremities, normal strength against resistance, and no sensory deficits. Patient appropriate for discharge with Robaxin and ibuprofen for symptoms. Ice recommended as well as alternate ice and heat packs after 48 hours. Patient's instructed to followup with primary care provider an orthopedist. Indications for ED return discussed. Patient states comfort and understanding of plan with no unaddressed concerns.  Antony Madura, PA-C 06/23/13 1515

## 2013-07-08 ENCOUNTER — Ambulatory Visit (HOSPITAL_COMMUNITY)
Admission: RE | Admit: 2013-07-08 | Discharge: 2013-07-08 | Disposition: A | Discharge status: Home / Self Care | Payer: Medicaid Other | Source: Ambulatory Visit | Attending: Obstetrics & Gynecology | Admitting: Obstetrics & Gynecology

## 2013-07-08 DIAGNOSIS — Z1231 Encounter for screening mammogram for malignant neoplasm of breast: Secondary | ICD-10-CM | POA: Insufficient documentation

## 2013-07-08 DIAGNOSIS — Z01419 Encounter for gynecological examination (general) (routine) without abnormal findings: Secondary | ICD-10-CM

## 2013-09-14 ENCOUNTER — Emergency Department (HOSPITAL_COMMUNITY): Payer: Medicaid Other

## 2013-09-14 ENCOUNTER — Emergency Department (HOSPITAL_COMMUNITY)
Admission: EM | Admit: 2013-09-14 | Discharge: 2013-09-14 | Disposition: A | Discharge status: Home / Self Care | Payer: Medicaid Other | Attending: Emergency Medicine | Admitting: Emergency Medicine

## 2013-09-14 ENCOUNTER — Encounter (HOSPITAL_COMMUNITY): Payer: Self-pay | Admitting: Emergency Medicine

## 2013-09-14 DIAGNOSIS — F3289 Other specified depressive episodes: Secondary | ICD-10-CM | POA: Insufficient documentation

## 2013-09-14 DIAGNOSIS — E669 Obesity, unspecified: Secondary | ICD-10-CM | POA: Insufficient documentation

## 2013-09-14 DIAGNOSIS — R630 Anorexia: Secondary | ICD-10-CM | POA: Insufficient documentation

## 2013-09-14 DIAGNOSIS — Z79899 Other long term (current) drug therapy: Secondary | ICD-10-CM | POA: Insufficient documentation

## 2013-09-14 DIAGNOSIS — F329 Major depressive disorder, single episode, unspecified: Secondary | ICD-10-CM | POA: Insufficient documentation

## 2013-09-14 DIAGNOSIS — R142 Eructation: Secondary | ICD-10-CM | POA: Insufficient documentation

## 2013-09-14 DIAGNOSIS — R1010 Upper abdominal pain, unspecified: Secondary | ICD-10-CM

## 2013-09-14 DIAGNOSIS — R109 Unspecified abdominal pain: Secondary | ICD-10-CM | POA: Insufficient documentation

## 2013-09-14 DIAGNOSIS — R5381 Other malaise: Secondary | ICD-10-CM | POA: Insufficient documentation

## 2013-09-14 DIAGNOSIS — F17200 Nicotine dependence, unspecified, uncomplicated: Secondary | ICD-10-CM | POA: Insufficient documentation

## 2013-09-14 DIAGNOSIS — R141 Gas pain: Secondary | ICD-10-CM | POA: Insufficient documentation

## 2013-09-14 DIAGNOSIS — R110 Nausea: Secondary | ICD-10-CM | POA: Insufficient documentation

## 2013-09-14 LAB — COMPREHENSIVE METABOLIC PANEL
ALT: 12 U/L (ref 0–35)
AST: 24 U/L (ref 0–37)
Albumin: 3.6 g/dL (ref 3.5–5.2)
CO2: 28 mEq/L (ref 19–32)
Calcium: 9 mg/dL (ref 8.4–10.5)
Chloride: 101 mEq/L (ref 96–112)
Creatinine, Ser: 0.79 mg/dL (ref 0.50–1.10)
GFR calc non Af Amer: >90 mL/min (ref >90)
Sodium: 139 mEq/L (ref 135–145)
Total Bilirubin: 0.2 mg/dL — ABNORMAL LOW (ref 0.3–1.2)

## 2013-09-14 LAB — URINALYSIS, ROUTINE W REFLEX MICROSCOPIC
Glucose, UA: NEGATIVE mg/dL
Hgb urine dipstick: NEGATIVE
pH: 7.5 (ref 5.0–8.0)

## 2013-09-14 LAB — CBC WITH DIFFERENTIAL/PLATELET
Basophils Absolute: 0 10*3/uL (ref 0.0–0.1)
Basophils Relative: 0 % (ref 0–1)
Lymphocytes Relative: 33 % (ref 12–46)
MCHC: 32.8 g/dL (ref 30.0–36.0)
Monocytes Absolute: 0.5 10*3/uL (ref 0.1–1.0)
Neutro Abs: 3.8 10*3/uL (ref 1.7–7.7)
Neutrophils Relative %: 57 % (ref 43–77)
Platelets: 382 10*3/uL (ref 150–400)
RDW: 14.8 % (ref 11.5–15.5)
WBC: 6.7 10*3/uL (ref 4.0–10.5)

## 2013-09-14 LAB — URINE MICROSCOPIC-ADD ON

## 2013-09-14 MED ORDER — FAMOTIDINE 20 MG PO TABS: 20.0000 mg | ORAL_TABLET | Freq: Two times a day (BID) | ORAL | Status: DC

## 2013-09-14 MED ORDER — GI COCKTAIL ~~LOC~~
30.0000 mL | Freq: Once | ORAL | Status: AC
  Administered 2013-09-14: 30 mL via ORAL
  Filled 2013-09-14: qty 30

## 2013-09-14 MED ORDER — ONDANSETRON 4 MG PO TBDP
8.0000 mg | ORAL_TABLET | Freq: Once | ORAL | Status: AC
  Administered 2013-09-14: 8 mg via ORAL
  Filled 2013-09-14: qty 2

## 2013-09-14 MED ORDER — SUCRALFATE 1 G PO TABS: 1.0000 g | ORAL_TABLET | Freq: Four times a day (QID) | ORAL | Status: DC

## 2013-09-14 MED ORDER — LANSOPRAZOLE 30 MG PO CPDR: 30.0000 mg | DELAYED_RELEASE_CAPSULE | Freq: Every day | ORAL | Status: DC

## 2013-09-14 NOTE — ED Notes (Signed)
Pt c/o mid abd pain with nausea x 1 week; pt sts some abd bloating

## 2013-09-14 NOTE — ED Provider Notes (Signed)
  Medical screening examination/treatment/procedure(s) were performed by non-physician practitioner and as supervising physician I was immediately available for consultation/collaboration.    Gerhard Munch, MD 09/14/13 1745

## 2013-09-14 NOTE — ED Notes (Signed)
Patient transported to Ultrasound 

## 2013-09-14 NOTE — ED Provider Notes (Signed)
CSN: [id number]     Arrival date & time 09/14/13  1045 History   First MD Initiated Contact with Patient 09/14/13 1120     Chief Complaint  Patient presents with  . Abdominal Pain  . Nausea   (Consider location/radiation/quality/duration/timing/severity/associated sxs/prior Treatment) HPI Whitney Benton is a 42 y.o. female presents to emergency department with complaint of abdominal pain. Patient states she has had intermittent abdominal pain for months. States pain is in the upper abdomen both right and left quadrants. States associated nausea and generalized malaise. States also felt like had some bloating. States nausea and pain is worsened with eating. Patient states she went to her doctor 3 weeks ago who ordered blood tests and told her he was cut order some more tests but she never followed up with them. She is unsure what blood tests has showed. Patient states that pain has been progressively getting worse and this morning it was 10 out of 10. States currently it is down to 7/10. Patient did not take any medications for this. Patient denies any vomiting. Patient denies any changes in bowels and last bowel movement was this morning and was normal. She has had history of hysterectomy. She does not have any urinary symptoms. She denies any fever, chills. He should states pain does not radiate from the upper abdomen states she does not have any pain in her back or lower abdomen. No other complaints Past Medical History  Diagnosis Date  . Depression    Past Surgical History  Procedure Laterality Date  . Cesarean section    . Vaginal hysterectomy  2006    Fibroids, menorrhagia, benign pathology   Family History  Problem Relation Age of Onset  . Diabetes Mother   . Mental illness Mother   . Depression Mother   . Hypertension Mother   . Hypertension Father   . Diabetes Father    History  Substance Use Topics  . Smoking status: Current Every Day Smoker -- 0.25 packs/day for 8 years   Types: Cigarettes  . Smokeless tobacco: Not on file  . Alcohol Use: Yes     Comment: occasional   OB History   Grav Para Term Preterm Abortions TAB SAB Ect Mult Living   5 3 3  2  0 2 0 0 3     Review of Systems  Constitutional: Positive for appetite change. Negative for fever and chills.  HENT: Negative for neck pain and neck stiffness.   Respiratory: Negative for cough, chest tightness and shortness of breath.   Cardiovascular: Negative for chest pain, palpitations and leg swelling.  Gastrointestinal: Positive for nausea and abdominal pain. Negative for vomiting, diarrhea, constipation, abdominal distention and anal bleeding.  Genitourinary: Negative for dysuria, flank pain, vaginal bleeding, vaginal discharge, vaginal pain and pelvic pain.  Musculoskeletal: Negative for myalgias and arthralgias.  Skin: Negative for rash.  Neurological: Negative for dizziness, weakness and headaches.  All other systems reviewed and are negative.    Allergies  Aspirin  Home Medications   Current Outpatient Rx  Name  Route  Sig  Dispense  Refill  . ibuprofen (ADVIL,MOTRIN) 800 MG tablet   Oral   Take 1 tablet (800 mg total) by mouth every 8 (eight) hours as needed. For pain   30 tablet   0   . methocarbamol (ROBAXIN) 500 MG tablet   Oral   Take 1 tablet (500 mg total) by mouth daily as needed (for tight muscles, muscle pain).   20 tablet  0   . nystatin (MYCOSTATIN) powder   Topical   Apply topically 3 (three) times daily.   60 g   5   . sertraline (ZOLOFT) 50 MG tablet   Oral   Take 50 mg by mouth daily.          BP 113/56  Pulse 71  Temp(Src) 98.3 F (36.8 C) (Oral)  Resp 18  SpO2 98% Physical Exam  Nursing note and vitals reviewed. Constitutional: She appears well-developed and well-nourished. No distress.  obese  Eyes: Conjunctivae are normal.  Neck: Neck supple.  Cardiovascular: Normal rate, regular rhythm and normal heart sounds.   Pulmonary/Chest: Breath  sounds normal. No respiratory distress. She has no wheezes.  Abdominal: Soft. Bowel sounds are normal. She exhibits no distension. There is tenderness. There is no rebound and no guarding.  Right upper quadrant, epigastric, left upper quadrant tenderness. No guarding. No CVA tenderness  Neurological: She is alert.  Skin: Skin is warm and dry.    ED Course  Procedures (including critical care time) Labs Review Labs Reviewed  CBC WITH DIFFERENTIAL - Abnormal; Notable for the following:    Hemoglobin 11.6 (*)    HCT 35.4 (*)    All other components within normal limits  COMPREHENSIVE METABOLIC PANEL - Abnormal; Notable for the following:    Glucose, Bld 110 (*)    Total Bilirubin 0.2 (*)    All other components within normal limits  URINALYSIS, ROUTINE W REFLEX MICROSCOPIC - Abnormal; Notable for the following:    Leukocytes, UA SMALL (*)    All other components within normal limits  URINE MICROSCOPIC-ADD ON - Abnormal; Notable for the following:    Squamous Epithelial / LPF FEW (*)    All other components within normal limits  LIPASE, BLOOD   Imaging Review US Abdomen Complete  09/14/2013   CLINICAL DATA:  Abdominal pain. Nausea.  EXAM: ABDOMEN ULTRASOUND  COMPARISON:  Radiographs 03/01/2011.  FINDINGS: Gallbladder  Contracted. No sonographic Murphy sign. Tiny echogenic focus is present in the fundus, most compatible with a gallbladder polyp. No further evaluation is warranted. No stones.  Common bile duct  Diameter: 4 mm, normal.  Liver  Normal.  IVC  Normal.  Pancreas  Normal.  Spleen  Size and appearance within normal limits.  Right Kidney  Length: 11.6 cm. Echogenicity within normal limits. No mass or hydronephrosis visualized.  Left Kidney  Length: 11.7 cm. Echogenicity within normal limits. No mass or hydronephrosis visualized.  Abdominal aorta  No aneurysm visualized.  IMPRESSION: No acute abnormality. Abdominal ultrasound is within normal limits. Negative for cholelithiasis or  cholecystitis.   Electronically Signed   By: Andreas Newport M.D.   On: 09/14/2013 12:24    MDM   1. Upper abdominal pain      Pt with upper abdominal pain for "months." Associated with nausea. No vomiting. No fever. No elevated WBC. Normal LFTs. Normal lipase. Pt's abdomen is soft. Mild tenderness in the upper abdomen. No guarding. Given symptoms for several months and unremarkable exam and labs, doubt acute abdomen. US obtained for evaluation of gallbladder and it is negative. It is possible that pt's symptoms are due to GERD vs gastritis vs peptic ulcer disease. Also could be biliary dyskinesis. I have discussed this with pt. Will start on prevacid, pepcid, carafate as needed. Follow up with pcp.     Filed Vitals:   09/14/13 1104 09/14/13 1146  BP: 113/56 113/68  Pulse: 71 75  Temp: 98.3 F (36.8  C)   TempSrc: Oral   Resp: 18   SpO2: 98% 98%     Lottie Mussel, PA-C 09/14/13 1303

## 2014-03-03 ENCOUNTER — Emergency Department (HOSPITAL_COMMUNITY)
Admission: EM | Admit: 2014-03-03 | Discharge: 2014-03-03 | Disposition: A | Discharge status: Home / Self Care | Payer: Medicaid Other | Attending: Emergency Medicine | Admitting: Emergency Medicine

## 2014-03-03 ENCOUNTER — Encounter (HOSPITAL_COMMUNITY): Payer: Self-pay | Admitting: Emergency Medicine

## 2014-03-03 DIAGNOSIS — Z79899 Other long term (current) drug therapy: Secondary | ICD-10-CM | POA: Insufficient documentation

## 2014-03-03 DIAGNOSIS — Y939 Activity, unspecified: Secondary | ICD-10-CM | POA: Insufficient documentation

## 2014-03-03 DIAGNOSIS — F329 Major depressive disorder, single episode, unspecified: Secondary | ICD-10-CM | POA: Insufficient documentation

## 2014-03-03 DIAGNOSIS — IMO0002 Reserved for concepts with insufficient information to code with codable children: Secondary | ICD-10-CM | POA: Insufficient documentation

## 2014-03-03 DIAGNOSIS — S43401A Unspecified sprain of right shoulder joint, initial encounter: Secondary | ICD-10-CM

## 2014-03-03 DIAGNOSIS — Y929 Unspecified place or not applicable: Secondary | ICD-10-CM | POA: Insufficient documentation

## 2014-03-03 DIAGNOSIS — F3289 Other specified depressive episodes: Secondary | ICD-10-CM | POA: Insufficient documentation

## 2014-03-03 DIAGNOSIS — F17200 Nicotine dependence, unspecified, uncomplicated: Secondary | ICD-10-CM | POA: Insufficient documentation

## 2014-03-03 DIAGNOSIS — E669 Obesity, unspecified: Secondary | ICD-10-CM | POA: Insufficient documentation

## 2014-03-03 DIAGNOSIS — X58XXXA Exposure to other specified factors, initial encounter: Secondary | ICD-10-CM | POA: Insufficient documentation

## 2014-03-03 MED ORDER — IBUPROFEN 600 MG PO TABS: 600.0000 mg | ORAL_TABLET | Freq: Four times a day (QID) | ORAL | Status: DC | PRN

## 2014-03-03 MED ORDER — TRAMADOL HCL 50 MG PO TABS: 50.0000 mg | ORAL_TABLET | Freq: Four times a day (QID) | ORAL | Status: DC | PRN

## 2014-03-03 MED ORDER — OXYCODONE-ACETAMINOPHEN 5-325 MG PO TABS
1.0000 | ORAL_TABLET | Freq: Once | ORAL | Status: AC
  Administered 2014-03-03: 1 via ORAL
  Filled 2014-03-03: qty 1

## 2014-03-03 NOTE — ED Provider Notes (Signed)
CSN: [id number]     Arrival date & time 03/03/14  1630 History  This chart was scribed for non-physician practitioner, Jeannett Senior, PA-C,working with Janice Norrie, MD, by Marlowe Kays, ED Scribe.  This patient was seen in room WTR6/WTR6 and the patient's care was started at 6:21 PM.  Chief Complaint  Patient presents with  . Arm Pain    right   The history is provided by the patient. No language interpreter was used.   HPI Comments:  Whitney Benton is a 43 y.o. obese female who presents to the Emergency Department complaining of worsening upper right arm pain that started approximately one month ago. She states the pain is now radiating to her upper back around her right shoulder blade. She states she was taking Ibuprofen for the pain, but lost the prescription so now she is not taking anything. She states any movement of the arm makes the pain worse. She states she has not seen her PCP because she has not had time/transportation. She denies any injury or trauma to the arm.   Past Medical History  Diagnosis Date  . Depression    Past Surgical History  Procedure Laterality Date  . Cesarean section    . Vaginal hysterectomy  2006    Fibroids, menorrhagia, benign pathology   Family History  Problem Relation Age of Onset  . Diabetes Mother   . Mental illness Mother   . Depression Mother   . Hypertension Mother   . Hypertension Father   . Diabetes Father    History  Substance Use Topics  . Smoking status: Current Every Day Smoker -- 0.25 packs/day for 8 years    Types: Cigarettes  . Smokeless tobacco: Not on file  . Alcohol Use: Yes     Comment: occasional   OB History   Grav Para Term Preterm Abortions TAB SAB Ect Mult Living   5 3 3  2  0 2 0 0 3     Review of Systems  Constitutional: Negative for fever.  Musculoskeletal: Positive for myalgias (right arm pain).    Allergies  Aspirin  Home Medications   Current Outpatient Rx  Name  Route  Sig  Dispense   Refill  . ibuprofen (ADVIL,MOTRIN) 800 MG tablet   Oral   Take 800 mg by mouth every 8 (eight) hours as needed for moderate pain.         Marland Kitchen nystatin (MYCOSTATIN) powder   Topical   Apply topically 3 (three) times daily.   60 g   5   . sertraline (ZOLOFT) 50 MG tablet   Oral   Take 50 mg by mouth daily.          Triage Vitals: BP 115/74  Pulse 90  Temp(Src) 98.6 F (37 C) (Oral)  Resp 19  SpO2 100% Physical Exam  Nursing note and vitals reviewed. Constitutional: She is oriented to person, place, and time. She appears well-developed and well-nourished.  HENT:  Head: Normocephalic and atraumatic.  Eyes: EOM are normal.  Neck: Normal range of motion.  Cardiovascular: Normal rate.   Good radial pulses.  Pulmonary/Chest: Effort normal.  Musculoskeletal: Normal range of motion.  Tender to palpation over proximal right upper. No bruising, swelling, erythema noted. Pain with range of motion of the shoulder joint in all directions. Strength against resistance with flexion and extension of the shoulder is intact. Bicep strength is intact. Distal radial pulses intact. There is no swelling in the hand or forearm. Normal  elbow and wrist exam.  Neurological: She is alert and oriented to person, place, and time.  Good strength of RUE.  Skin: Skin is warm and dry.  Psychiatric: She has a normal mood and affect. Her behavior is normal.    ED Course  Procedures (including critical care time) DIAGNOSTIC STUDIES: Oxygen Saturation is 100% on RA, normal by my interpretation.   COORDINATION OF CARE: 6:26 PM- Advised pt to stretch the arm, ice, and heat the area. Will give pt referral to an orthopedist. Will give pain medication prior to discharge. Pt verbalizes understanding and agrees to plan.  Medications - No data to display  Labs Review Labs Reviewed - No data to display Imaging Review No results found.   EKG Interpretation None      MDM   Final diagnoses:  Sprain of  shoulder, right    Patient with right shoulder and upper arm pain for a month. No known injuries. No pain in uniform. No signs of a blood clot. She is neurovascularly intact. Strength and distal pulses intact. The pain is reproducible with movement of the shoulder joint.  Filed Vitals:   03/03/14 1653  BP: 115/74  Pulse: 90  Temp: 98.6 F (37 C)  TempSrc: Oral  Resp: 19  SpO2: 100%     I personally performed the services described in this documentation, which was scribed in my presence. The recorded information has been reviewed and is accurate.    Renold Genta, PA-C 03/03/14 1945

## 2014-03-03 NOTE — ED Notes (Signed)
Pt c/o upper right arm pain that started about a month ago but last night pt states that pain got worse and now radiating to her back.

## 2014-03-03 NOTE — Discharge Instructions (Signed)
Ibuprofen for pain. Ultram for severe pain. Follow up with your doctor or an orthopedics specialist. No lifting greater than 1 lb for 1-2 weeks or until symptoms improve. Ice/heat. Stretch.   Shoulder Sprain A shoulder sprain is the result of damage to the tough, fiber-like tissues (ligaments) that help hold your shoulder in place. The ligaments may be stretched or torn. Besides the main shoulder joint (the ball and socket), there are several smaller joints that connect the bones in this area. A sprain usually involves one of those joints. Most often it is the acromioclavicular (or AC) joint. That is the joint that connects the collarbone (clavicle) and the shoulder blade (scapula) at the top point of the shoulder blade (acromion). A shoulder sprain is a mild form of what is called a shoulder separation. Recovering from a shoulder sprain may take some time. For some, pain lingers for several months. Most people recover without long term problems. CAUSES   A shoulder sprain is usually caused by some kind of trauma. This might be:  Falling on an outstretched arm.  Being hit hard on the shoulder.  Twisting the arm.  Shoulder sprains are more likely to occur in people who:  Play sports.  Have balance or coordination problems. SYMPTOMS   Pain when you move your shoulder.  Limited ability to move the shoulder.  Swelling and tenderness on top of the shoulder.  Redness or warmth in the shoulder.  Bruising.  A change in the shape of the shoulder. DIAGNOSIS  Your healthcare provider may:  Ask about your symptoms.  Ask about recent activity that might have caused those symptoms.  Examine your shoulder. You may be asked to do simple exercises to test movement. The other shoulder will be examined for comparison.  Order some tests that provide a look inside the body. They can show the extent of the injury. The tests could include:  X-rays.  CT (computed tomography) scan.  MRI  (magnetic resonance imaging) scan. RISKS AND COMPLICATIONS  Loss of full shoulder motion.  Ongoing shoulder pain. TREATMENT  How long it takes to recover from a shoulder sprain depends on how severe it was. Treatment options may include:  Rest. You should not use the arm or shoulder until it heals.  Ice. For 2 or 3 days after the injury, put an ice pack on the shoulder up to 4 times a day. It should stay on for 15 to 20 minutes each time. Wrap the ice in a towel so it does not touch your skin.  Over-the-counter medicine to relieve pain.  A sling or brace. This will keep the arm still while the shoulder is healing.  Physical therapy or rehabilitation exercises. These will help you regain strength and motion. Ask your healthcare provider when it is OK to begin these exercises.  Surgery. The need for surgery is rare with a sprained shoulder, but some people may need surgery to keep the joint in place and reduce pain. HOME CARE INSTRUCTIONS   Ask your healthcare provider about what you should and should not do while your shoulder heals.  Make sure you know how to apply ice to the correct area of your shoulder.  Talk with your healthcare provider about which medications should be used for pain and swelling.  If rehabilitation therapy will be needed, ask your healthcare provider to refer you to a therapist. If it is not recommended, then ask about at-home exercises. Find out when exercise should begin. SEEK MEDICAL CARE IF:  Your pain, swelling, or redness at the joint increases. SEEK IMMEDIATE MEDICAL CARE IF:   You have a fever.  You cannot move your arm or shoulder. Document Released: 04/27/2009 Document Revised: 03/02/2012 Document Reviewed: 04/27/2009 [facility] Patient Information 2014 [place], [place].

## 2014-03-03 NOTE — ED Provider Notes (Signed)
Medical screening examination/treatment/procedure(s) were performed by non-physician practitioner and as supervising physician I was immediately available for consultation/collaboration.   EKG Interpretation None      Rolland Porter, MD, Abram Sander   Janice Norrie, MD 03/03/14 2326

## 2014-06-09 ENCOUNTER — Emergency Department (HOSPITAL_COMMUNITY)
Admission: EM | Admit: 2014-06-09 | Discharge: 2014-06-09 | Disposition: A | Discharge status: Home / Self Care | Payer: Medicaid Other | Attending: Emergency Medicine | Admitting: Emergency Medicine

## 2014-06-09 ENCOUNTER — Encounter (HOSPITAL_COMMUNITY): Payer: Self-pay | Admitting: Emergency Medicine

## 2014-06-09 DIAGNOSIS — F3289 Other specified depressive episodes: Secondary | ICD-10-CM | POA: Insufficient documentation

## 2014-06-09 DIAGNOSIS — F17200 Nicotine dependence, unspecified, uncomplicated: Secondary | ICD-10-CM | POA: Insufficient documentation

## 2014-06-09 DIAGNOSIS — F329 Major depressive disorder, single episode, unspecified: Secondary | ICD-10-CM | POA: Insufficient documentation

## 2014-06-09 DIAGNOSIS — M545 Low back pain, unspecified: Secondary | ICD-10-CM | POA: Insufficient documentation

## 2014-06-09 DIAGNOSIS — Z79899 Other long term (current) drug therapy: Secondary | ICD-10-CM | POA: Insufficient documentation

## 2014-06-09 MED ORDER — TRAMADOL HCL 50 MG PO TABS: 50.0000 mg | ORAL_TABLET | Freq: Four times a day (QID) | ORAL | Status: DC | PRN

## 2014-06-09 MED ORDER — NAPROXEN 500 MG PO TABS: 500.0000 mg | ORAL_TABLET | Freq: Two times a day (BID) | ORAL | Status: DC

## 2014-06-09 MED ORDER — CYCLOBENZAPRINE HCL 10 MG PO TABS: 10.0000 mg | ORAL_TABLET | Freq: Three times a day (TID) | ORAL | Status: DC | PRN

## 2014-06-09 NOTE — ED Notes (Signed)
Patient is alert and oriented x3.  She is complaining of right side back pain. Additionally she is complaining of intermittent chest pain that started a few weeks ago. She states that she has been under a lot of stress over the last two weeks.   Currently she rates her pain 10 of 10.

## 2014-06-09 NOTE — ED Provider Notes (Signed)
CSN: [id number]     Arrival date & time 06/09/14  1933 History  This chart was scribed for a non-physician practitioner, Hazel Sams, working with Varney Biles, MD by Martinique Peace, ED Scribe. The patient was seen in [facility]. The patient's care was started at 9:17 PM.     Chief Complaint  Patient presents with  . Back Pain    right mid      The history is provided by the patient. No language interpreter was used.   HPI Comments: Whitney Benton is a 43 y.o. female who presents to the Emergency Department complaining of constant right sided back pain onset Tuesday after waking up out of her sleep. She describes the pain to be near her ribs. Pain is exacerbated with movement especially bending and stretching of that side of the back. She denies any recent injury or strenuous activity. Patient also mentions that over the past several weeks she has occasionally noticed a sharp pinching and pain to her left chest area. This usually lasts only a second and is gone. There is no continued soreness. Denies any burning sensation, belching or indigestion. She denies any urine or kidney infections, DM, hypertension, hematuria, or dysuria. She further denies any coughing, acid reflux, or any activities that could have led to back pain. She reports that she took 800 mg of IBU around 6 PM to help alleviate pain but it was not effective.  no other aggravating or alleviating factors. No other associated symptoms.     Past Medical History  Diagnosis Date  . Depression    Past Surgical History  Procedure Laterality Date  . Cesarean section    . Vaginal hysterectomy  2006    Fibroids, menorrhagia, benign pathology   Family History  Problem Relation Age of Onset  . Diabetes Mother   . Mental illness Mother   . Depression Mother   . Hypertension Mother   . Hypertension Father   . Diabetes Father    History  Substance Use Topics  . Smoking status: Current Every Day Smoker -- 0.25 packs/day for 8  years    Types: Cigarettes  . Smokeless tobacco: Not on file  . Alcohol Use: Yes     Comment: occasional   OB History   Grav Para Term Preterm Abortions TAB SAB Ect Mult Living   5 3 3  2  0 2 0 0 3     Review of Systems  Respiratory: Negative for cough.   Genitourinary: Negative for hematuria.  All other systems reviewed and are negative.     Allergies  Aspirin  Home Medications   Prior to Admission medications   Medication Sig Start Date End Date Taking? Authorizing Provider  cyclobenzaprine (FLEXERIL) 5 MG tablet Take 5 mg by mouth 3 (three) times daily as needed for muscle spasms.   Yes Historical Provider, MD  ibuprofen (ADVIL,MOTRIN) 600 MG tablet Take 1 tablet (600 mg total) by mouth every 6 (six) hours as needed. 03/03/14   Tatyana A Kirichenko, PA-C  ibuprofen (ADVIL,MOTRIN) 800 MG tablet Take 800 mg by mouth every 8 (eight) hours as needed for moderate pain.    Historical Provider, MD  nystatin (MYCOSTATIN) powder Apply topically 3 (three) times daily. 06/10/13   Osborne Oman, MD  sertraline (ZOLOFT) 50 MG tablet Take 50 mg by mouth daily.    Historical Provider, MD  traMADol (ULTRAM) 50 MG tablet Take 1 tablet (50 mg total) by mouth every 6 (six) hours as needed. 03/03/14  Tatyana A Kirichenko, PA-C   Triage Vitals: BP 116/74  Pulse 75  Temp(Src) 98.5 F (36.9 C) (Oral)  Resp 18  SpO2 97% Physical Exam  Nursing note and vitals reviewed. Constitutional: She is oriented to person, place, and time. She appears well-developed and well-nourished. No distress.  HENT:  Head: Normocephalic.  Cardiovascular: Normal rate and regular rhythm.   No murmur heard. Pulmonary/Chest: Effort normal and breath sounds normal. No respiratory distress. She has no wheezes. She has no rales.  Abdominal: Soft. There is no tenderness. There is no rebound and no guarding.  No CVA tenderness.  Musculoskeletal:       Cervical back: Normal.       Thoracic back: She exhibits  tenderness. She exhibits no bony tenderness, no edema and no deformity.       Lumbar back: Normal.       Back:  Patient has tenderness along the right lower thoracic and upper lumbar paraspinous muscles. There is some tightness. No gross deformities.  Neurological: She is alert and oriented to person, place, and time.  Skin: Skin is warm and dry. No rash noted.  Psychiatric: She has a normal mood and affect. Her behavior is normal.    ED Course  Procedures  DIAGNOSTIC STUDIES: Oxygen Saturation is 97% on room air, normal by my interpretation.    COORDINATION OF CARE: 9:23 PM- Treatment plan was discussed with patient who verbalizes understanding and agrees.   patient with constant right-sided back pain with significant tenderness. Pain is exacerbated with movements. This seems muscle skeletal in nature. Denies any urinary complaints. Patient also with atypical very brief sharp left chest pains intermittently for the past few weeks. Currently not having any symptoms. No associated cough or shortness of breath. She has no history of hypertension, hyperlipidemia or diabetes. Symptoms do not seem concerning for any ACS.      MDM   Final diagnoses:  Right-sided low back pain without sciatica    I personally performed the services described in this documentation, which was scribed in my presence. The recorded information has been reviewed and is accurate.    Martie Lee, PA-C 06/09/14 2208

## 2014-06-09 NOTE — ED Provider Notes (Signed)
Medical screening examination/treatment/procedure(s) were performed by non-physician practitioner and as supervising physician I was immediately available for consultation/collaboration.   EKG Interpretation None       Varney Biles, MD 06/09/14 2301

## 2014-06-09 NOTE — Discharge Instructions (Signed)
You were seen for your back pain. At this time your providers do not feel your back pain is caused by any emergency. Please use warm compresses for 20-30 minutes to help with muscle pain and soreness. Use medications prescribed and followup with a primary care provider.    Back Pain, Adult Low back pain is very common. About 1 in 5 people have back pain.The cause of low back pain is rarely dangerous. The pain often gets better over time.About half of people with a sudden onset of back pain feel better in just 2 weeks. About 8 in 10 people feel better by 6 weeks.  CAUSES Some common causes of back pain include:  Strain of the muscles or ligaments supporting the spine.  Wear and tear (degeneration) of the spinal discs.  Arthritis.  Direct injury to the back. DIAGNOSIS Most of the time, the direct cause of low back pain is not known.However, back pain can be treated effectively even when the exact cause of the pain is unknown.Answering your caregiver's questions about your overall health and symptoms is one of the most accurate ways to make sure the cause of your pain is not dangerous. If your caregiver needs more information, he or she may order lab work or imaging tests (X-rays or MRIs).However, even if imaging tests show changes in your back, this usually does not require surgery. HOME CARE INSTRUCTIONS For many people, back pain returns.Since low back pain is rarely dangerous, it is often a condition that people can learn to [facility] their own.   Remain active. It is stressful on the back to sit or stand in one place. Do not sit, drive, or stand in one place for more than 30 minutes at a time. Take short walks on level surfaces as soon as pain allows.Try to increase the length of time you walk each day.  Do not stay in bed.Resting more than 1 or 2 days can delay your recovery.  Do not avoid exercise or work.Your body is made to move.It is not dangerous to be active, even though  your back may hurt.Your back will likely heal faster if you return to being active before your pain is gone.  Pay attention to your body when you bend and lift. Many people have less discomfortwhen lifting if they bend their knees, keep the load close to their bodies,and avoid twisting. Often, the most comfortable positions are those that put less stress on your recovering back.  Find a comfortable position to sleep. Use a firm mattress and lie on your side with your knees slightly bent. If you lie on your back, put a pillow under your knees.  Only take over-the-counter or prescription medicines as directed by your caregiver. Over-the-counter medicines to reduce pain and inflammation are often the most helpful.Your caregiver may prescribe muscle relaxant drugs.These medicines help dull your pain so you can more quickly return to your normal activities and healthy exercise.  Put ice on the injured area.  Put ice in a plastic bag.  Place a towel between your skin and the bag.  Leave the ice on for 15-20 minutes, 03-04 times a day for the first 2 to 3 days. After that, ice and heat may be alternated to reduce pain and spasms.  Ask your caregiver about trying back exercises and gentle massage. This may be of some benefit.  Avoid feeling anxious or stressed.Stress increases muscle tension and can worsen back pain.It is important to recognize when you are anxious or stressed  and learn ways to manage it.Exercise is a great option. SEEK MEDICAL CARE IF:  You have pain that is not relieved with rest or medicine.  You have pain that does not improve in 1 week.  You have new symptoms.  You are generally not feeling well. SEEK IMMEDIATE MEDICAL CARE IF:   You have pain that radiates from your back into your legs.  You develop new bowel or bladder control problems.  You have unusual weakness or numbness in your arms or legs.  You develop nausea or vomiting.  You develop abdominal  pain.  You feel faint. Document Released: 12/09/2005 Document Revised: 06/09/2012 Document Reviewed: 04/29/2011 [facility] Patient Information 2015 [place], [place]. This information is not intended to replace advice given to you by your health care provider. Make sure you discuss any questions you have with your health care provider.    Back Exercises Back exercises help treat and prevent back injuries. The goal is to increase your strength in your belly (abdominal) and back muscles. These exercises can also help with flexibility. Start these exercises when told by your doctor. HOME CARE Back exercises include: Pelvic Tilt.  Lie on your back with your knees bent. Tilt your pelvis until the lower part of your back is against the floor. Hold this position 5 to 10 sec. Repeat this exercise 5 to 10 times. Knee to Chest.  Pull 1 knee up against your chest and hold for 20 to 30 seconds. Repeat this with the other knee. This may be done with the other leg straight or bent, whichever feels better. Then, pull both knees up against your chest. Sit-Ups or Curl-Ups.  Bend your knees 90 degrees. Start with tilting your pelvis, and do a partial, slow sit-up. Only lift your upper half 30 to 45 degrees off the floor. Take at least 2 to 3 seonds for each sit-up. Do not do sit-ups with your knees out straight. If partial sit-ups are difficult, simply do the above but with only tightening your belly (abdominal) muscles and holding it as told. Hip-Lift.  Lie on your back with your knees flexed 90 degrees. Push down with your feet and shoulders as you raise your hips 2 inches off the floor. Hold for 10 seconds, repeat 5 to 10 times. Back Arches.  Lie on your stomach. Prop yourself up on bent elbows. Slowly press on your hands, causing an arch in your low back. Repeat 3 to 5 times. Shoulder-Lifts.  Lie face down with arms beside your body. Keep hips and belly pressed to floor as you slowly lift your head and  shoulders off the floor. Do not overdo your exercises. Be careful in the beginning. Exercises may cause you some mild back discomfort. If the pain lasts for more than 15 minutes, stop the exercises until you see your doctor. Improvement with exercise for back problems is slow.  Document Released: 01/11/2011 Document Revised: 03/02/2012 Document Reviewed: 10/10/2011 [facility] Patient Information 2015 [place], [place]. This information is not intended to replace advice given to you by your health care provider. Make sure you discuss any questions you have with your health care provider.

## 2014-06-09 NOTE — ED Notes (Signed)
Pt ambulatory to exam room with steady gait.  

## 2014-06-18 ENCOUNTER — Other Ambulatory Visit: Payer: Self-pay | Admitting: Obstetrics & Gynecology

## 2014-06-23 ENCOUNTER — Ambulatory Visit (INDEPENDENT_AMBULATORY_CARE_PROVIDER_SITE_OTHER): Payer: Medicaid Other | Admitting: Family Medicine

## 2014-06-23 ENCOUNTER — Encounter: Payer: Self-pay | Admitting: Family Medicine

## 2014-06-23 VITALS — BP 110/67 | HR 80 | Temp 98.6°F | Ht 63.0 in | Wt 192.1 lb

## 2014-06-23 DIAGNOSIS — Z0000 Encounter for general adult medical examination without abnormal findings: Secondary | ICD-10-CM

## 2014-06-23 NOTE — Patient Instructions (Signed)
°Emergency Department Resource Guide °1) Find a Doctor and Pay Out of Pocket °Although you won't have to find out who is covered by your insurance plan, it is a good idea to ask around and get recommendations. You will then need to call the office and see if the doctor you have chosen will accept you as a new patient and what types of options they offer for patients who are self-pay. Some doctors offer discounts or will set up payment plans for their patients who do not have insurance, but you will need to ask so you aren't surprised when you get to your appointment. ° °2) Contact Your Local Health Department °Not all health departments have doctors that can see patients for sick visits, but many do, so it is worth a call to see if yours does. If you don't know where your local health department is, you can check in your phone book. The CDC also has a tool to help you locate your state's health department, and many state websites also have listings of all of their local health departments. ° °3) Find a Walk-in Clinic °If your illness is not likely to be very severe or complicated, you may want to try a walk in clinic. These are popping up all over the country in pharmacies, drugstores, and shopping centers. They're usually staffed by nurse practitioners or physician assistants that have been trained to treat common illnesses and complaints. They're usually fairly quick and inexpensive. However, if you have serious medical issues or chronic medical problems, these are probably not your best option. ° °No Primary Care Doctor: °- Call [facility] at  832-8000 - they can help you locate a primary care doctor that  accepts your insurance, provides certain services, etc. °- Physician Referral Service- 1-800-533-3463 ° °Chronic Pain Problems: °Organization         Address  Phone   Notes  °[facility]  (336) 297-2271 Patients need to be referred by their primary care doctor.  ° °Medication  Assistance: °Organization         Address  Phone   Notes  °[place] [facility] [address] °[place], [place] [address] (336) 641-8030 --Must be a resident of [place] °-- Must have NO insurance coverage whatsoever (no Medicaid/ Medicare, etc.) °-- The pt. MUST have a primary care doctor that directs their care regularly and follows them in the community °  °[facility]  (866) 331-1348   °[employer]  (888) 892-1162   ° °Agencies that provide inexpensive medical care: °Organization         Address  Phone   Notes  °[facility]  (336) 832-8035   °[facility]    (336) 832-7272   °[facility] [address] °[place], [place] [address] (336) 832-4777   °[facility] [address], °[place] (336) 271-4999   °[facility]    (336) 373-0678   °[facility]    (336) 272-1050   °[facility] ° [address], [place] Phone:  (336) 832-4444, Fax:  (336) 832-4440 Hours of Operation:  9 am - 6 pm, M-F.  Also accepts Medicaid/Medicare and self-pay.  °[facility] ° [address], [place] Phone: (336) 832-3150, Fax: (336) 832-3151. Hours of Operation:  8:30 am - 5:30 pm, M-F.  Also accepts Medicaid and self-pay.  °[facility] [place] [address], [place] Phone: (336) 878-6027   °[facility] [address], [place], [place] (336)723-1848, Ext. 123 Mondays & Thursdays: 7-9 AM.  First 15 patients are seen on a first come, first serve basis. °  ° °Medicaid-accepting [place] Providers: ° °Organization         Address  Phone   Notes  °[facility] [address], [place] (336) 641-2100 Also accepts self-pay patients.  °[facility] [address], [place] ° (336) 856-9996   °[facility] [address], [place]  (336) 288-8857   °[facility] [address] [place] [address], [place] (336) 299-7000   °Veita Bland [address], [place]  ° (336) 373-1557 Only accepts [place] Access Medicaid patients after they have their name applied to their card.  ° °Self-Pay (no insurance) in [place]: ° °Organization         Address  Phone   Notes  °Sickle Cell Patients, [facility] [address], [place] (336) 832-1970   °[facility] [address], [place] (336) 832-4400   °[facility] ° [address], [place] (336) 992-4800   °[facility]/Dr. Osei-Bonsu ° [address] [place] [address], [place] or [address], [place] (336) 841-8500 Phone number for both [place] and [place] locations is the same.  °[facility] [address], [place] (336) 299-0000   °[facility] [address] [place] [address], [place] or [address] (336) 852-7530 °(336) 878-2260   °[facility] [address], [place] (336) 350-1642, phone; (336) 294-5005, fax Sees patients 1st and 3rd Saturday of every month.  Must not qualify for public or private insurance (i.e. Medicaid, Medicare, [place] Health Choice, Veterans' Benefits) • Household income should be no more than 200% of the poverty level •The clinic cannot treat you if you are pregnant or think you are pregnant • Sexually transmitted diseases are not treated at the clinic.  ° ° °Dental Care: °Organization         Address  Phone  Notes  °[place] [facility] [address], [place] (336) 641-6152 Accepts children up to age 21 who are enrolled in Medicaid or [place] Health Choice; pregnant women with a Medicaid card; and children who have applied for Medicaid or [place] Health Choice, but were declined, whose parents can pay a reduced fee at time of service.  °[place]  [facility] [place]  [address], [place] (336) 641-7733 Accepts children up to age 21 who are enrolled in Medicaid or [place] Health Choice; pregnant women with a Medicaid card; and children who have applied for Medicaid or  Health Choice, but were declined, whose parents can pay a reduced fee at time of service.  °[facility] ° [address], [place] (336) 641-4533 Patients are seen by appointment only. Walk-ins are not accepted. [facility] will see patients 18 years of age and older. °Monday - Tuesday (8am-5pm) °Most Wednesdays (8:30-5pm) °$30 per visit, cash only  °[facility] ° [address], [place] (336) 641-4533 Patients are seen by appointment only. Walk-ins are not accepted. [facility] will see patients 18 years of age and older. °One   Wednesday Evening (Monthly: Volunteer Based).  $30 per visit, cash only  °[facility]  (919) 537-3737 for adults; Children under age 4, call Graduate Pediatric Dentistry at (919) [phone]. Children aged 4-14, please call (919) [phone] to request a pediatric application. ° Dental services are provided in all areas of dental care including fillings, crowns and bridges, complete and partial dentures, implants, gum treatment, root canals, and extractions. Preventive care is also provided. Treatment is provided to both adults and children. °Patients are selected via a lottery and there is often a waiting list. °  °[facility] [address], °[place] ° (336) 763-8833 [email or web address] °  °[facility] [address], [place], [place] (336)723-1848, Ext. 123 Second and Fourth Thursday of each month, opens at 6:30 AM; Clinic ends at 9 AM.  Patients are seen on a first-come first-served basis, and a limited number are seen during each clinic.  ° °[facility] ° [address], [place], [place] (336) [phone]    Eligibility Requirements °You must have lived in [place], [place], or [place] counties for at least the last three months. °  You cannot be eligible for state or federal sponsored healthcare insurance, including Veterans Administration, Medicaid, or Medicare. °  You generally cannot be eligible for healthcare insurance through your employer.  °  How to apply: °Eligibility screenings are held every Tuesday and Wednesday afternoon from 1:00 pm until 4:00 pm. You do not need an appointment for the interview!  °[facility] [address], [place], [place] 336-631-2330   °[facility]  336-342-8273   °[facility]  336-703-3100   °[facility]  336-570-6415   ° °Behavioral Health Resources in the Community: °Intensive Outpatient Programs °Organization         Address  Phone  Notes  °[facility] [address], [place], [place] 336-878-6098   °[facility] [address], [place], [place] 336-832-9800   °[facility] [address], [place], [place] ° 336-882-2125   °[facility] [address],  °[place], [place] 1-800-853-5163 or 336-641-4981   °Substance Abuse Resources °Organization         Address  Phone  Notes  °[facility]  336-882-2125   °[facility]  336-784-9470   °[facility]  336-285-9073   °[facility]  336-845-3988   °Residential & Outpatient Substance Abuse Program  1-800-659-3381   °Psychological Services °Organization         Address  Phone  Notes  °[facility]  336- 832-9600   °[facility]  336- 378-7881   °[facility] [address], [place] 1-800-853-5163 or 336-641-4981   ° °Mobile Crisis Teams °Organization         Address  Phone  Notes  °[facility], Mobile Crisis Care Unit  1-877-626-1772   °Assertive °[facility] ° [address]  [place], [place] 336-834-9664   °Sharon DeEsch [address] °[place] 336-554-5454   ° °Self-Help/Support Groups °Organization         Address  Phone             Notes  °[facility]  - variety of support groups  336- 373-1402 Call for more information  °Narcotics Anonymous (NA), [facility] [address], °[place]  2 meetings at this location  ° °  Residential Treatment Programs °Organization         Address  Phone  Notes  °[facility] [address],    °[place]  1-866-801-8205   °[facility] ° [address], [place], [place] 704-293-8524   °[facility] [address], [place] 336-845-3988 Admissions: 8am-3pm M-F  °[facility] [address]    °[place], [place] 336-841-1104   °[facility] [address], [place], [place] 336-379-7146   °[facility] [address]  °[place], [place] 336-285-9073   °[facility] Intensive Outpatient [address], [place], [place] 336-852-3033   °[facility] [address]  °[place], [place] 1-877-615-2722 or 336-784-9470   °[facility] [address] [place], [place] 336-227-7417 Accepts Medicaid  °[facility] [address]  ° [place] 1-800-659-3381 Substance Abuse/Addiction Treatment  ° °[place] Behavioral Health Resources °Organization         Address  Phone  Notes  °[facility]  (888) 581-9988   °Julie Brannon, PhD [address] [place], [place]   (336) 349-5553 or (336) 951-0000   °[facility]   [address] °[place], [place] (336) 349-4454   °[facility] [address], [place], [place] (336) 342-8316 Insurance/Medicaid/sponsorship through [facility]  °[facility] [address]                                    [place], [place] (336) 342-8316 Therapy/tele-psych/case    °[facility] [address]  ° [place], [place] (336) 349-2233    °Dr. Arfeen  (336) 349-4544   °[facility] [place]  [employer] [place] [facility] 1) [address], [place] °2) [address], [place] °3)  [address], [place] (336) 349-3220 °(336) 342-7768 ° °(336) 342-8140   °[place] [facility] (336) 342-1394 or (336) 342-3537 (After Hours)    ° ° °

## 2014-06-23 NOTE — Progress Notes (Signed)
Pt feels like something is going to fall out of her vagina. Intense pressure when she is at work.

## 2014-06-23 NOTE — Progress Notes (Signed)
Subjective:    Whitney Benton is a 43 y.o. female who presents for an annual exam. The patient has no complaints today. The patient is sexually active. GYN screening history: hysterectomy 2006 - benign, 3 children. The patient wears seatbelts: yes. The patient participates in regular exercise: yes. Has the patient ever been transfused or tattooed?: no. The patient reports that there is not domestic violence in her life.   Pt has occassional dryness and uses vaginal refresh  Pt is s/p TVH in 2006, c-section previously Mammogram July 17  1 week ago pt was at work and had a pelvic pressure sensation that she was going to give birth. Since onset it has been constant. Improves when lying down. Pt has pain and resolves with rest.   Menstrual History: OB History   Grav Para Term Preterm Abortions TAB SAB Ect Mult Living   5 3 3  2  0 2 0 0 3       No LMP recorded. Patient has had a hysterectomy.    The following portions of the patient's history were reviewed and updated as appropriate: allergies, current medications, past family history, past medical history, past social history, past surgical history and problem list.  Review of Systems Pertinent items are noted in HPI.    Objective:    BP 110/67  Pulse 80  Temp(Src) 98.6 F (37 C)  Ht 5\' 3"  (1.6 m)  Wt 87.136 kg (192 lb 1.6 oz)  BMI 34.04 kg/m2  General Appearance:    Alert, cooperative, no distress, appears stated age  Head:    Normocephalic, without obvious abnormality, atraumatic  Eyes:     conjunctiva/corneas clear, EOM's intact     Nose:   Nares normal, septum midline, mucosa normal,  Throat:   Lips, mucosa, and tongue normal; teeth and gums normal  Neck:   Supple, symmetrical, trachea midline, no adenopathy;    thyroid:  no enlargement/tenderness/nodules;   Back:     Symmetric, no curvature, ROM normal, no CVA tenderness  Lungs:     Clear to auscultation bilaterally, respirations unlabored  Chest Wall:    No tenderness  or deformity   Heart:    Regular rate and rhythm, S1 and S2 normal, no murmur, rub   or gallop  Breast Exam:    No tenderness, masses, or nipple abnormality  Abdomen:     Soft, non-tender, bowel sounds active all four quadrants,    no masses, no organomegaly  Genitalia:     Normal appearing cuff with mild prolapse on exam. Normal female without lesion, discharge or tenderness     Extremities:   Extremities normal, atraumatic, no cyanosis or edema  Pulses:   2+ and symmetric all extremities  Skin:   Skin color, texture, turgor normal, no rashes or lesions  Lymph nodes:   Cervical, supraclavicular, and axillary nodes normal  Neurologic:   CN grossly intact, normal strength, sensation and reflexes    throughout  .    Assessment:    Healthy female exam.    Plan:        - mammogram ordered - likely vaginal prolpase - f/u with dr. Hulan Benton for pessary evaluation - OK to use Ibuprofen  - return 1 year for annual.   Fredrik Rigger, MD [facility]

## 2014-06-25 ENCOUNTER — Inpatient Hospital Stay (HOSPITAL_COMMUNITY): Payer: Medicaid Other

## 2014-06-25 ENCOUNTER — Inpatient Hospital Stay (HOSPITAL_COMMUNITY)
Admission: AD | Admit: 2014-06-25 | Discharge: 2014-06-25 | Disposition: A | Discharge status: Home / Self Care | Payer: Medicaid Other | Source: Ambulatory Visit | Attending: Obstetrics & Gynecology | Admitting: Obstetrics & Gynecology

## 2014-06-25 ENCOUNTER — Encounter (HOSPITAL_COMMUNITY): Payer: Self-pay | Admitting: Family

## 2014-06-25 DIAGNOSIS — N83209 Unspecified ovarian cyst, unspecified side: Secondary | ICD-10-CM | POA: Diagnosis not present

## 2014-06-25 DIAGNOSIS — A5901 Trichomonal vulvovaginitis: Secondary | ICD-10-CM | POA: Diagnosis not present

## 2014-06-25 DIAGNOSIS — N83202 Unspecified ovarian cyst, left side: Secondary | ICD-10-CM

## 2014-06-25 DIAGNOSIS — N83201 Unspecified ovarian cyst, right side: Secondary | ICD-10-CM

## 2014-06-25 DIAGNOSIS — N949 Unspecified condition associated with female genital organs and menstrual cycle: Secondary | ICD-10-CM | POA: Diagnosis present

## 2014-06-25 DIAGNOSIS — F17200 Nicotine dependence, unspecified, uncomplicated: Secondary | ICD-10-CM | POA: Insufficient documentation

## 2014-06-25 LAB — URINALYSIS, ROUTINE W REFLEX MICROSCOPIC
Bilirubin Urine: NEGATIVE
GLUCOSE, UA: NEGATIVE mg/dL
HGB URINE DIPSTICK: NEGATIVE
Ketones, ur: NEGATIVE mg/dL
Leukocytes, UA: NEGATIVE
Nitrite: NEGATIVE
PH: 6.5 (ref 5.0–8.0)
Protein, ur: NEGATIVE mg/dL
SPECIFIC GRAVITY, URINE: 1.015 (ref 1.005–1.030)
Urobilinogen, UA: 0.2 mg/dL (ref 0.0–1.0)

## 2014-06-25 LAB — WET PREP, GENITAL
CLUE CELLS WET PREP: NONE SEEN
YEAST WET PREP: NONE SEEN

## 2014-06-25 MED ORDER — METRONIDAZOLE 500 MG PO TABS
2000.0000 mg | ORAL_TABLET | Freq: Once | ORAL | Status: AC
  Administered 2014-06-25: 2000 mg via ORAL
  Filled 2014-06-25: qty 4

## 2014-06-25 NOTE — Discharge Instructions (Signed)
Your partner(s) will need to be treated.  No sex until 10 days after you finished your medicine and no sex until 10 days after your partner has taken their medication.

## 2014-06-25 NOTE — MAU Note (Signed)
Pt states for past two weeks has had vaginal pressure that worsens at night however is constant. Denies bleeding or vag d/c changes. No uti s/s.

## 2014-06-25 NOTE — MAU Provider Note (Signed)
History     CSN: [id number]  Arrival date and time: 06/25/14 1129   First Provider Initiated Contact with Patient 06/25/14 1203      Chief Complaint  Patient presents with  . Vaginal pressure    HPI Whitney Benton 43 y.o. Client comes to MAU with increasing vaginal pressure.  Was seen in the clinic on 06-23-14 and the note indicated there would be a consult with Dr. Hulan Fray re: the use of a pessary for suspected prolapse.  Client states that the speculum exam seemed to aggravate the pressure she had been feeling and now it was even worse.    OB History   Grav Para Term Preterm Abortions TAB SAB Ect Mult Living   5 3 3  2  0 2 0 0 3      Past Medical History  Diagnosis Date  . Depression     Past Surgical History  Procedure Laterality Date  . Cesarean section    . Vaginal hysterectomy  2006    Fibroids, menorrhagia, benign pathology    Family History  Problem Relation Age of Onset  . Diabetes Mother   . Mental illness Mother   . Depression Mother   . Hypertension Mother   . Hypertension Father   . Diabetes Father     History  Substance Use Topics  . Smoking status: Current Every Day Smoker -- 0.25 packs/day for 8 years    Types: Cigarettes  . Smokeless tobacco: Not on file  . Alcohol Use: Yes     Comment: occasional    Allergies:  Allergies  Allergen Reactions  . Aspirin Hives    Prescriptions prior to admission  Medication Sig Dispense Refill  . cyclobenzaprine (FLEXERIL) 5 MG tablet Take 5 mg by mouth 3 (three) times daily as needed for muscle spasms.      Marland Kitchen ibuprofen (ADVIL,MOTRIN) 800 MG tablet Take 800 mg by mouth every 8 (eight) hours as needed for moderate pain.      Marland Kitchen nystatin (MYCOSTATIN/NYSTOP) 100000 UNIT/GM POWD Apply 1 g topically 3 (three) times daily as needed (rash).      . sertraline (ZOLOFT) 50 MG tablet Take 50 mg by mouth daily.      . cyclobenzaprine (FLEXERIL) 10 MG tablet Take 1 tablet (10 mg total) by mouth 3 (three) times daily as  needed for muscle spasms.  30 tablet  0    Review of Systems  Constitutional: Negative for fever.  Gastrointestinal: Negative for nausea, vomiting and abdominal pain.  Genitourinary:       Vaginal pressure No vaginal discharge. Denies feeling anything coming out of the vagina.   Physical Exam   Blood pressure 125/76, pulse 76, temperature 98.6 F (37 C), temperature source Oral, resp. rate 16, height 5\' 3"  (1.6 m), weight 192 lb (87.091 kg).  Physical Exam  Nursing note and vitals reviewed. Constitutional: She is oriented to person, place, and time. She appears well-developed and well-nourished.  HENT:  Head: Normocephalic.  Eyes: EOM are normal.  Neck: Neck supple.  GI: Soft. There is no tenderness.  Genitourinary:  Speculum exam: Vulva - no tissue protrudes from the vagina with coughing Vagina - Small amount of creamy discharge, no odor Cervix - surgically absent, no evidence of prolapse visually Bimanual exam: Adnexa non tender, no masses bilaterally No evidence of prolapse on bimanual exam wet prep done Chaperone present for exam. Does not feel any additional pressure when standing.  Reports the worse pressure is at night while  in bed.  Musculoskeletal: Normal range of motion.  Neurological: She is alert and oriented to person, place, and time.  Skin: Skin is warm and dry.  Psychiatric: She has a normal mood and affect.    MAU Course  Procedures. Results for orders placed during the hospital encounter of 06/25/14 (from the past 24 hour(s))  URINALYSIS, ROUTINE W REFLEX MICROSCOPIC     Status: None   Collection Time    06/25/14 11:39 AM      Result Value Ref Range   Color, Urine YELLOW  YELLOW   APPearance CLEAR  CLEAR   Specific Gravity, Urine 1.015  1.005 - 1.030   pH 6.5  5.0 - 8.0   Glucose, UA NEGATIVE  NEGATIVE mg/dL   Hgb urine dipstick NEGATIVE  NEGATIVE   Bilirubin Urine NEGATIVE  NEGATIVE   Ketones, ur NEGATIVE  NEGATIVE mg/dL   Protein, ur  NEGATIVE  NEGATIVE mg/dL   Urobilinogen, UA 0.2  0.0 - 1.0 mg/dL   Nitrite NEGATIVE  NEGATIVE   Leukocytes, UA NEGATIVE  NEGATIVE  WET PREP, GENITAL     Status: Abnormal   Collection Time    06/25/14 12:15 PM      Result Value Ref Range   Yeast Wet Prep HPF POC NONE SEEN  NONE SEEN   Trich, Wet Prep FEW (*) NONE SEEN   Clue Cells Wet Prep HPF POC NONE SEEN  NONE SEEN   WBC, Wet Prep HPF POC FEW (*) NONE SEEN    MDM CLINICAL DATA: Vaginal pressure, improved when lying down. Status  post vaginal hysterectomy in 2006. Previous C-section.  EXAM:  TRANSABDOMINAL AND TRANSVAGINAL ULTRASOUND OF PELVIS  TECHNIQUE:  Both transabdominal and transvaginal ultrasound examinations of the  pelvis were performed. Transabdominal technique was performed for  global imaging of the pelvis including uterus, ovaries, adnexal  regions, and pelvic cul-de-sac. It was necessary to proceed with  endovaginal exam following the transabdominal exam to visualize the  ovaries and vaginal cuff in better detail.  COMPARISON: 06/13/2008 and abdomen and pelvis CT dated 09/15/2006.  FINDINGS:  Uterus  Surgically absent.  Endometrium  Surgically absent.  Right ovary  Measurements: 2.9 x 2.1 x 1.6 cm. 1.7 cm partially collapsed  follicle.  Left ovary  Measurements: 5.9 x 5.8 x 3.1 cm. 5.5 cm cyst containing multiple  internal echoes. This was not previously present.  Other findings  Trace free peritoneal fluid. Three adjacent simple cysts separate  from the right ovary in the right adnexal region. These have not  changed significantly since 06/13/2008. Together, these measure 2.7  x 2.7 x 2.0 cm.  IMPRESSION:  1. No acute abnormality.  2. Stable cluster of 3 simple appearing cysts in the right adnexal  region, separate from the right ovary.  3. Interval 5.5 cm hemorrhagic left ovarian cyst. Short-interval  follow up ultrasound in 6-12 weeks is recommended, preferably during  the week following the  patient's normal menses.    Assessment and Plan  Trichomonas Ovarian cysts, bilaterally  Plan Will give Metronidazole 2 gm po now for trich Your partner(s) will need to be treated.  No sex until 10 days after you finished your medicine and no sex until 10 days after your partner has taken their medication. Will get urine GC/Chlam today. Has another appointment in GYN clinic on Monday  Evett Kassa 06/25/2014, 12:52 PM

## 2014-06-27 ENCOUNTER — Ambulatory Visit (INDEPENDENT_AMBULATORY_CARE_PROVIDER_SITE_OTHER): Payer: Medicaid Other | Admitting: Obstetrics & Gynecology

## 2014-06-27 ENCOUNTER — Encounter: Payer: Self-pay | Admitting: Obstetrics & Gynecology

## 2014-06-27 VITALS — BP 120/87 | HR 82 | Temp 98.9°F | Ht 63.0 in | Wt 188.5 lb

## 2014-06-27 DIAGNOSIS — N83209 Unspecified ovarian cyst, unspecified side: Secondary | ICD-10-CM

## 2014-06-27 DIAGNOSIS — N3946 Mixed incontinence: Secondary | ICD-10-CM

## 2014-06-27 DIAGNOSIS — N993 Prolapse of vaginal vault after hysterectomy: Secondary | ICD-10-CM

## 2014-06-27 LAB — GC/CHLAMYDIA PROBE AMP
CT PROBE, AMP APTIMA: NEGATIVE
GC Probe RNA: NEGATIVE

## 2014-06-27 NOTE — Progress Notes (Signed)
Here for evaluation of possible vaginal prolapse and pessary fitting. Also was seen in MAU over weekend and they informed her she has ovarian cysts and to f/u with MD today.

## 2014-06-27 NOTE — Progress Notes (Signed)
   Subjective:    Patient ID: Whitney Benton, female    DOB: [birth year], 43 y.o.   MRN: [id number]  HPI  This 43 yo AA lady is here with 3 concerns. She has mixed incontinence, feels vaginal pressure, and still feels pelvic pain from a 5.5 cm left ovarian cyst seen on u/s 2 days ago in the MAU. She is sexually active.  Review of Systems     Objective:   Physical Exam  I placed a 70 mm ring pessary and she says that the vaginal pressure completely resolved. She jumped and did not have GSUI. She told me that without the pessary, she would have peed on the floor.  She was able to remove and replace it without difficulty.      Assessment & Plan:  Vaginal pressure and mixed incontinence- The pessary has resolved 2 of these issues. Pelvic pain and ovarian cyst- I declined to write narcotics and suggested IBU and tylenol. I will see her in a month for a pessary check and order a follow up u/s at that time.

## 2014-07-28 ENCOUNTER — Ambulatory Visit: Payer: Medicaid Other

## 2014-07-29 ENCOUNTER — Ambulatory Visit
Admission: RE | Admit: 2014-07-29 | Discharge: 2014-07-29 | Disposition: A | Discharge status: Home / Self Care | Payer: Medicaid Other | Source: Ambulatory Visit | Attending: Family Medicine | Admitting: Family Medicine

## 2014-07-29 DIAGNOSIS — Z0000 Encounter for general adult medical examination without abnormal findings: Secondary | ICD-10-CM

## 2014-08-10 ENCOUNTER — Ambulatory Visit: Payer: Medicaid Other | Admitting: Obstetrics & Gynecology

## 2014-09-21 ENCOUNTER — Telehealth: Payer: Self-pay | Admitting: *Deleted

## 2014-09-21 ENCOUNTER — Encounter: Payer: Self-pay | Admitting: *Deleted

## 2014-09-21 ENCOUNTER — Ambulatory Visit: Payer: Self-pay | Admitting: Obstetrics & Gynecology

## 2014-09-21 NOTE — Telephone Encounter (Signed)
Attempted to contact patient, no answer, left message for patient to call clinic and reschedule missed appointment. Will send letter.  Letter sent.

## 2014-10-18 ENCOUNTER — Encounter: Payer: Self-pay | Admitting: General Practice

## 2014-10-24 ENCOUNTER — Encounter: Payer: Self-pay | Admitting: Obstetrics & Gynecology

## 2014-11-13 ENCOUNTER — Encounter (HOSPITAL_COMMUNITY): Payer: Self-pay | Admitting: Emergency Medicine

## 2014-11-13 ENCOUNTER — Inpatient Hospital Stay (HOSPITAL_COMMUNITY)
Admission: EM | Admit: 2014-11-13 | Discharge: 2014-11-14 | DRG: 092 | Disposition: A | Discharge status: Home / Self Care | Payer: Self-pay | Attending: Internal Medicine | Admitting: Internal Medicine

## 2014-11-13 DIAGNOSIS — F17210 Nicotine dependence, cigarettes, uncomplicated: Secondary | ICD-10-CM | POA: Diagnosis present

## 2014-11-13 DIAGNOSIS — I639 Cerebral infarction, unspecified: Secondary | ICD-10-CM

## 2014-11-13 DIAGNOSIS — F329 Major depressive disorder, single episode, unspecified: Secondary | ICD-10-CM | POA: Diagnosis present

## 2014-11-13 DIAGNOSIS — R531 Weakness: Secondary | ICD-10-CM | POA: Diagnosis present

## 2014-11-13 DIAGNOSIS — F419 Anxiety disorder, unspecified: Secondary | ICD-10-CM | POA: Diagnosis present

## 2014-11-13 DIAGNOSIS — R449 Unspecified symptoms and signs involving general sensations and perceptions: Secondary | ICD-10-CM | POA: Diagnosis present

## 2014-11-13 DIAGNOSIS — Z90710 Acquired absence of both cervix and uterus: Secondary | ICD-10-CM

## 2014-11-13 DIAGNOSIS — E876 Hypokalemia: Secondary | ICD-10-CM | POA: Diagnosis present

## 2014-11-13 DIAGNOSIS — Z886 Allergy status to analgesic agent status: Secondary | ICD-10-CM

## 2014-11-13 DIAGNOSIS — R200 Anesthesia of skin: Principal | ICD-10-CM | POA: Diagnosis present

## 2014-11-13 DIAGNOSIS — F32A Depression, unspecified: Secondary | ICD-10-CM | POA: Diagnosis present

## 2014-11-13 DIAGNOSIS — Z79899 Other long term (current) drug therapy: Secondary | ICD-10-CM

## 2014-11-13 DIAGNOSIS — R29898 Other symptoms and signs involving the musculoskeletal system: Secondary | ICD-10-CM | POA: Diagnosis present

## 2014-11-13 DIAGNOSIS — Z720 Tobacco use: Secondary | ICD-10-CM | POA: Diagnosis present

## 2014-11-13 DIAGNOSIS — N390 Urinary tract infection, site not specified: Secondary | ICD-10-CM | POA: Diagnosis present

## 2014-11-13 LAB — DIFFERENTIAL
Basophils Absolute: 0 10*3/uL (ref 0.0–0.1)
Basophils Relative: 0 % (ref 0–1)
EOS PCT: 2 % (ref 0–5)
Eosinophils Absolute: 0.1 10*3/uL (ref 0.0–0.7)
LYMPHS ABS: 3 10*3/uL (ref 0.7–4.0)
Lymphocytes Relative: 41 % (ref 12–46)
MONO ABS: 0.8 10*3/uL (ref 0.1–1.0)
Monocytes Relative: 10 % (ref 3–12)
Neutro Abs: 3.5 10*3/uL (ref 1.7–7.7)
Neutrophils Relative %: 47 % (ref 43–77)

## 2014-11-13 LAB — APTT: aPTT: 28 seconds (ref 24–37)

## 2014-11-13 LAB — I-STAT CHEM 8, ED
BUN: 6 mg/dL (ref 6–23)
Calcium, Ion: 1.16 mmol/L (ref 1.12–1.23)
Chloride: 104 mEq/L (ref 96–112)
Creatinine, Ser: 0.8 mg/dL (ref 0.50–1.10)
GLUCOSE: 95 mg/dL (ref 70–99)
HEMATOCRIT: 41 % (ref 36.0–46.0)
HEMOGLOBIN: 13.9 g/dL (ref 12.0–15.0)
POTASSIUM: 2.9 meq/L — AB (ref 3.7–5.3)
Sodium: 143 mEq/L (ref 137–147)
TCO2: 25 mmol/L (ref 0–100)

## 2014-11-13 LAB — I-STAT TROPONIN, ED: Troponin i, poc: 0 ng/mL (ref 0.00–0.08)

## 2014-11-13 LAB — PROTIME-INR
INR: 1.07 (ref 0.00–1.49)
Prothrombin Time: 14 seconds (ref 11.6–15.2)

## 2014-11-13 LAB — CBC
HEMATOCRIT: 36.8 % (ref 36.0–46.0)
Hemoglobin: 12 g/dL (ref 12.0–15.0)
MCH: 27.1 pg (ref 26.0–34.0)
MCHC: 32.6 g/dL (ref 30.0–36.0)
MCV: 83.1 fL (ref 78.0–100.0)
Platelets: 454 10*3/uL — ABNORMAL HIGH (ref 150–400)
RBC: 4.43 MIL/uL (ref 3.87–5.11)
RDW: 14.9 % (ref 11.5–15.5)
WBC: 7.4 10*3/uL (ref 4.0–10.5)

## 2014-11-13 MED ORDER — POTASSIUM CHLORIDE CRYS ER 20 MEQ PO TBCR
40.0000 meq | EXTENDED_RELEASE_TABLET | Freq: Once | ORAL | Status: AC
  Administered 2014-11-14: 40 meq via ORAL
  Filled 2014-11-13: qty 2

## 2014-11-13 MED ORDER — POTASSIUM CHLORIDE 10 MEQ/100ML IV SOLN
10.0000 meq | INTRAVENOUS | Status: AC
  Administered 2014-11-14: 10 meq via INTRAVENOUS
  Filled 2014-11-13: qty 100

## 2014-11-13 MED ORDER — MAGNESIUM SULFATE 2 GM/50ML IV SOLN
2.0000 g | Freq: Once | INTRAVENOUS | Status: AC
  Administered 2014-11-14: 2 g via INTRAVENOUS
  Filled 2014-11-13: qty 50

## 2014-11-13 NOTE — ED Notes (Signed)
Pt. reports left hand numbness radiating up to left arm onset this afternoon ( 4 pm ) , denies pain or injury , strong grips , no arm drift , speech clear with no facial asymmetry /ambulatory . Alert and oriented/respirations unlabored .

## 2014-11-13 NOTE — ED Provider Notes (Signed)
CSN: [id number]     Arrival date & time 11/13/14  2208 History  This chart was scribed for Whitney Balls, MD by Randa Evens, ED Scribe. This patient was seen in room A07C/A07C and the patient's care was started at 11:00 PM.    Chief Complaint  Patient presents with  . Numbness   The history is provided by the patient. No language interpreter was used.   HPI Comments: Whitney Benton is a 43 y.o. female who presents to the Emergency Department complaining recurrent of left hand numbness onset 4 PM today. She states she has associated headache and weakness in the left hand.   She states that this is the second time she has had numbness in the the left hand. She states that the numbness lasted for about an hour the first time it happened and resolved on its own. She states that today that the numbness is constant and starting to radiate up her left arm. She continues to have the numbness now.  Denies blurred vision, cough or rhinorrhea.   Denies injury or trauma to the hand.   Past Medical History  Diagnosis Date  . Depression    Past Surgical History  Procedure Laterality Date  . Cesarean section    . Vaginal hysterectomy  2006    Fibroids, menorrhagia, benign pathology   Family History  Problem Relation Age of Onset  . Diabetes Mother   . Mental illness Mother   . Depression Mother   . Hypertension Mother   . Hypertension Father   . Diabetes Father    History  Substance Use Topics  . Smoking status: Current Every Day Smoker -- 0.25 packs/day for 8 years    Types: Cigarettes  . Smokeless tobacco: Never Used  . Alcohol Use: Yes     Comment: occasional   OB History    Gravida Para Term Preterm AB TAB SAB Ectopic Multiple Living   5 3 3  2  0 2 0 0 3     Review of Systems  HENT: Negative for rhinorrhea.   Eyes: Negative for visual disturbance.  Respiratory: Negative for cough.   Neurological: Positive for weakness, numbness and headaches.  All other systems reviewed and  are negative.   Allergies  Aspirin  Home Medications   Prior to Admission medications   Medication Sig Start Date End Date Taking? Authorizing Provider  cyclobenzaprine (FLEXERIL) 10 MG tablet Take 1 tablet (10 mg total) by mouth 3 (three) times daily as needed for muscle spasms. 06/09/14  Yes Peter S Dammen, PA-C  ibuprofen (ADVIL,MOTRIN) 800 MG tablet Take 800 mg by mouth every 8 (eight) hours as needed for moderate pain.   Yes Historical Provider, MD  sertraline (ZOLOFT) 50 MG tablet Take 50 mg by mouth daily.   Yes Historical Provider, MD  nystatin (MYCOSTATIN/NYSTOP) 100000 UNIT/GM POWD Apply 1 g topically 3 (three) times daily as needed (rash).    Historical Provider, MD   Triage Vitals: BP 146/89 mmHg  Pulse 78  Temp(Src) 98.3 F (36.8 C)  Resp 18  Ht 5\' 3"  (1.6 m)  Wt 185 lb (83.915 kg)  BMI 32.78 kg/m2  SpO2 98%  Physical Exam  Constitutional: She is oriented to person, place, and time. She appears well-developed and well-nourished. No distress.  HENT:  Head: Normocephalic and atraumatic.  Nose: Nose normal.  Mouth/Throat: Oropharynx is clear and moist. No oropharyngeal exudate.  Eyes: Conjunctivae and EOM are normal. Pupils are equal, round, and reactive to light.  No scleral icterus.  Neck: Normal range of motion. Neck supple. No JVD present. No tracheal deviation present. No thyromegaly present.  Cardiovascular: Normal rate, regular rhythm and normal heart sounds.  Exam reveals no gallop and no friction rub.   No murmur heard. Pulmonary/Chest: Effort normal and breath sounds normal. No respiratory distress. She has no wheezes. She exhibits no tenderness.  Abdominal: Soft. Bowel sounds are normal. She exhibits no distension and no mass. There is no tenderness. There is no rebound and no guarding.  Musculoskeletal: Normal range of motion. She exhibits no edema or tenderness.  Lymphadenopathy:    She has no cervical adenopathy.  Neurological: She is alert and oriented  to person, place, and time. No cranial nerve deficit.  Left Upper extremity numbness. 4 out of 5 strength of the Left upper extremity.  Otherwise normal strength and sensation. Normal cerebellar and gait testing.  Skin: Skin is warm and dry. No rash noted. She is not diaphoretic. No erythema. No pallor.  Nursing note and vitals reviewed.   ED Course  Procedures (including critical care time) DIAGNOSTIC STUDIES: Oxygen Saturation is 98% on RA, normal by my interpretation.    COORDINATION OF CARE: 11:05 PM-Discussed treatment plan with pt at bedside and pt agreed to plan.  11:44 PM-Consult with neurology    Labs Review Labs Reviewed  CBC - Abnormal; Notable for the following:    Platelets 454 (*)    All other components within normal limits  COMPREHENSIVE METABOLIC PANEL - Abnormal; Notable for the following:    Potassium 3.0 (*)    GFR calc non Af Amer 81 (*)    All other components within normal limits  URINE RAPID DRUG SCREEN (HOSP PERFORMED) - Abnormal; Notable for the following:    Opiates POSITIVE (*)    Tetrahydrocannabinol POSITIVE (*)    All other components within normal limits  URINALYSIS, ROUTINE W REFLEX MICROSCOPIC - Abnormal; Notable for the following:    APPearance CLOUDY (*)    Ketones, ur 15 (*)    Nitrite POSITIVE (*)    Leukocytes, UA SMALL (*)    All other components within normal limits  URINE MICROSCOPIC-ADD ON - Abnormal; Notable for the following:    Squamous Epithelial / LPF FEW (*)    Bacteria, UA MANY (*)    All other components within normal limits  I-STAT CHEM 8, ED - Abnormal; Notable for the following:    Potassium 2.9 (*)    All other components within normal limits  ETHANOL  PROTIME-INR  APTT  DIFFERENTIAL  I-STAT TROPOININ, ED  I-STAT TROPOININ, ED    Imaging Review Ct Head Wo Contrast  11/14/2014   CLINICAL DATA:  43 year old female with left hand numbness radiating of to left arm. Onset this afternoon.  EXAM: CT HEAD WITHOUT  CONTRAST  TECHNIQUE: Contiguous axial images were obtained from the base of the skull through the vertex without intravenous contrast.  COMPARISON:  09/16/2008  FINDINGS: No acute cortical infarct, hemorrhage, or mass lesion ispresent. Ventricles are of normal size. No significant extra-axial fluid collection is present. The paranasal sinuses andmastoid air cells are clear. The osseous skull is intact.  IMPRESSION: Normal brain.   Electronically Signed   By: Kerby Moors M.D.   On: 11/14/2014 01:55     EKG Interpretation   Date/Time:  Sunday November 13 2014 23:20:59 EST Ventricular Rate:  66 PR Interval:  167 QRS Duration: 91 QT Interval:  434 QTC Calculation: 455 R Axis:  62 Text Interpretation:  Sinus rhythm No significant change since last  tracing Confirmed by Glynn Octave [id number]) on 11/14/2014 1:34:11  AM      MDM   Final diagnoses:  None   Patient since emergency department out of concern for left upper extremity numbness. My exam she also has mild weakness of the left upper extremity. She states this has occurred previously once in the past and resolved on its own. I concern for stroke versus TIA. Will obtain a stroke workup and consult neurology for continued treatment.  Potassium is 2.9, patient was replaced with magnesium and potassium supplements.  Urinalysis reveals a possible infection. Upon repeat questioning of the patient, she denies any symptoms. She has no abdominal pain, dysuria, change in her frequency or change in the color of her urine. I hesitate to treat this infection as the patient is completely asymptomatic. Will defer to primary team.  Neurology evaluated the patient and agrees with her abnormal exam.  Patient has left upper extremity weakness and decreased sensation. They advised for inpatient admission with MRI and stroke workup. Currently awaiting CT scan of head.  CT head is negative. Patient admitted to try hospitalist, telemetry unit for  continued workup.   I personally performed the services described in this documentation, which was scribed in my presence. The recorded information has been reviewed and is accurate.       Whitney Balls, MD 11/14/14 [phone]

## 2014-11-14 ENCOUNTER — Encounter (HOSPITAL_COMMUNITY): Payer: Self-pay | Admitting: *Deleted

## 2014-11-14 ENCOUNTER — Inpatient Hospital Stay (HOSPITAL_COMMUNITY): Payer: MEDICAID

## 2014-11-14 ENCOUNTER — Emergency Department (HOSPITAL_COMMUNITY): Payer: Self-pay

## 2014-11-14 DIAGNOSIS — F419 Anxiety disorder, unspecified: Secondary | ICD-10-CM | POA: Diagnosis present

## 2014-11-14 DIAGNOSIS — R29898 Other symptoms and signs involving the musculoskeletal system: Secondary | ICD-10-CM | POA: Diagnosis present

## 2014-11-14 DIAGNOSIS — M6289 Other specified disorders of muscle: Secondary | ICD-10-CM

## 2014-11-14 DIAGNOSIS — E876 Hypokalemia: Secondary | ICD-10-CM | POA: Diagnosis present

## 2014-11-14 DIAGNOSIS — Z720 Tobacco use: Secondary | ICD-10-CM

## 2014-11-14 DIAGNOSIS — F329 Major depressive disorder, single episode, unspecified: Secondary | ICD-10-CM

## 2014-11-14 DIAGNOSIS — F32A Depression, unspecified: Secondary | ICD-10-CM | POA: Diagnosis present

## 2014-11-14 LAB — COMPREHENSIVE METABOLIC PANEL
ALT: 12 U/L (ref 0–35)
ANION GAP: 13 (ref 5–15)
AST: 21 U/L (ref 0–37)
Albumin: 3.8 g/dL (ref 3.5–5.2)
Alkaline Phosphatase: 62 U/L (ref 39–117)
BUN: 8 mg/dL (ref 6–23)
CALCIUM: 9 mg/dL (ref 8.4–10.5)
CO2: 26 meq/L (ref 19–32)
CREATININE: 0.86 mg/dL (ref 0.50–1.10)
Chloride: 103 mEq/L (ref 96–112)
GFR calc Af Amer: >90 mL/min (ref >90)
GFR, EST NON AFRICAN AMERICAN: 81 mL/min — AB (ref >90)
GLUCOSE: 96 mg/dL (ref 70–99)
Potassium: 3 mEq/L — ABNORMAL LOW (ref 3.7–5.3)
Sodium: 142 mEq/L (ref 137–147)
Total Bilirubin: 0.4 mg/dL (ref 0.3–1.2)
Total Protein: 7 g/dL (ref 6.0–8.3)

## 2014-11-14 LAB — HEMOGLOBIN A1C
Hgb A1c MFr Bld: 5.8 % — ABNORMAL HIGH (ref <5.7)
Mean Plasma Glucose: 120 mg/dL — ABNORMAL HIGH (ref <117)

## 2014-11-14 LAB — BASIC METABOLIC PANEL
Anion gap: 12 (ref 5–15)
BUN: 7 mg/dL (ref 6–23)
CO2: 22 mEq/L (ref 19–32)
Calcium: 8.4 mg/dL (ref 8.4–10.5)
Chloride: 108 mEq/L (ref 96–112)
Creatinine, Ser: 0.74 mg/dL (ref 0.50–1.10)
GFR calc Af Amer: >90 mL/min (ref >90)
GFR calc non Af Amer: >90 mL/min (ref >90)
GLUCOSE: 103 mg/dL — AB (ref 70–99)
POTASSIUM: 3.7 meq/L (ref 3.7–5.3)
Sodium: 142 mEq/L (ref 137–147)

## 2014-11-14 LAB — RAPID URINE DRUG SCREEN, HOSP PERFORMED
Amphetamines: NOT DETECTED
BARBITURATES: NOT DETECTED
Benzodiazepines: NOT DETECTED
COCAINE: NOT DETECTED
OPIATES: POSITIVE — AB
Tetrahydrocannabinol: POSITIVE — AB

## 2014-11-14 LAB — URINALYSIS, ROUTINE W REFLEX MICROSCOPIC
Bilirubin Urine: NEGATIVE
Glucose, UA: NEGATIVE mg/dL
HGB URINE DIPSTICK: NEGATIVE
Ketones, ur: 15 mg/dL — AB
NITRITE: POSITIVE — AB
PH: 6.5 (ref 5.0–8.0)
Protein, ur: NEGATIVE mg/dL
Specific Gravity, Urine: 1.028 (ref 1.005–1.030)
Urobilinogen, UA: 1 mg/dL (ref 0.0–1.0)

## 2014-11-14 LAB — URINE MICROSCOPIC-ADD ON

## 2014-11-14 LAB — LIPID PANEL
Cholesterol: 146 mg/dL (ref 0–200)
HDL: 46 mg/dL (ref >39)
LDL CALC: 81 mg/dL (ref 0–99)
TRIGLYCERIDES: 97 mg/dL (ref <150)
Total CHOL/HDL Ratio: 3.2 RATIO
VLDL: 19 mg/dL (ref 0–40)

## 2014-11-14 LAB — HIV ANTIBODY (ROUTINE TESTING W REFLEX): HIV 1&2 Ab, 4th Generation: NONREACTIVE

## 2014-11-14 LAB — RPR

## 2014-11-14 LAB — TSH: TSH: 2.69 u[IU]/mL (ref 0.350–4.500)

## 2014-11-14 LAB — MAGNESIUM: Magnesium: 2.3 mg/dL (ref 1.5–2.5)

## 2014-11-14 LAB — VITAMIN B12: VITAMIN B 12: 490 pg/mL (ref 211–911)

## 2014-11-14 LAB — ETHANOL: Alcohol, Ethyl (B): <11 mg/dL (ref 0–11)

## 2014-11-14 MED ORDER — HEPARIN SODIUM (PORCINE) 5000 UNIT/ML IJ SOLN
5000.0000 [IU] | Freq: Three times a day (TID) | INTRAMUSCULAR | Status: DC
Start: 2014-11-14 — End: 2014-11-14
  Administered 2014-11-14: 5000 [IU] via SUBCUTANEOUS
  Filled 2014-11-14 (×2): qty 1

## 2014-11-14 MED ORDER — ACETAMINOPHEN 325 MG PO TABS
650.0000 mg | ORAL_TABLET | Freq: Four times a day (QID) | ORAL | Status: DC | PRN
  Administered 2014-11-14 (×2): 650 mg via ORAL
  Filled 2014-11-14 (×2): qty 2

## 2014-11-14 MED ORDER — GADOBENATE DIMEGLUMINE 529 MG/ML IV SOLN
18.0000 mL | Freq: Once | INTRAVENOUS | Status: AC | PRN
  Administered 2014-11-14: 18 mL via INTRAVENOUS

## 2014-11-14 MED ORDER — STROKE: EARLY STAGES OF RECOVERY BOOK
Freq: Once | Status: AC
  Administered 2014-11-14: 04:00:00
  Filled 2014-11-14: qty 1

## 2014-11-14 MED ORDER — TRAMADOL HCL 50 MG PO TABS: 50.0000 mg | ORAL_TABLET | Freq: Four times a day (QID) | ORAL | Status: DC | PRN

## 2014-11-14 MED ORDER — SENNOSIDES-DOCUSATE SODIUM 8.6-50 MG PO TABS: 1.0000 | ORAL_TABLET | Freq: Every evening | ORAL | Status: DC | PRN

## 2014-11-14 MED ORDER — POTASSIUM CHLORIDE 10 MEQ/100ML IV SOLN
10.0000 meq | Freq: Once | INTRAVENOUS | Status: AC
  Administered 2014-11-14: 10 meq via INTRAVENOUS
  Filled 2014-11-14: qty 100

## 2014-11-14 MED ORDER — SERTRALINE HCL 50 MG PO TABS
50.0000 mg | ORAL_TABLET | Freq: Every day | ORAL | Status: DC
Start: 2014-11-14 — End: 2014-11-14
  Administered 2014-11-14: 50 mg via ORAL
  Filled 2014-11-14 (×2): qty 1

## 2014-11-14 NOTE — Consult Note (Signed)
Consult Reason for Consult:LUE weakness and paresthesias Referring Physician: Dr Claudine Mouton [facility] ED  CC: LUE paresthesias and weakness  HPI: Whitney Benton is an 43 y.o. female who presents to the Emergency Department complaining recurrent of left hand numbness onset 4 PM today. She states she has associated headache and weakness in the left hand.  She states that this is the second time she has had numbness in the the left hand. She states that the numbness lasted for about an hour the first time it happened and resolved on its own. She states that today that the numbness is constant and starting to radiate up her left arm.  Denies blurred vision, cough or rhinorrhea.Denies injury or trauma to her head or neck. Headache has resolved but sensory and motor symptoms persist.   No history of prior TIA or CVA. No HTN, HLD, DM. No history of blood clots. No family history of stroke at a young age.   Past Medical History  Diagnosis Date  . Depression     Past Surgical History  Procedure Laterality Date  . Cesarean section    . Vaginal hysterectomy  2006    Fibroids, menorrhagia, benign pathology    Family History  Problem Relation Age of Onset  . Diabetes Mother   . Mental illness Mother   . Depression Mother   . Hypertension Mother   . Hypertension Father   . Diabetes Father     Social History:  reports that she has been smoking Cigarettes.  She has a 2 pack-year smoking history. She has never used smokeless tobacco. She reports that she drinks alcohol. She reports that she does not use illicit drugs.  Allergies  Allergen Reactions  . Aspirin Hives    Medications: Scheduled:   Head CT pending  ROS: Out of a complete 14 system review, the patient complains of only the following symptoms, and all other reviewed systems are negative. + fatigue, weakness, numbness  Physical Examination: Filed Vitals:   11/14/14 0013  BP:   Pulse:   Temp: 97.6 F (36.4 C)  Resp:    Physical  Exam  Constitutional: He appears well-developed and well-nourished.  Psych: Affect appropriate to situation Eyes: No scleral injection HENT: No OP obstrucion Head: Normocephalic.  Cardiovascular: Normal rate and regular rhythm.  Respiratory: Effort normal and breath sounds normal.  GI: Soft. Bowel sounds are normal. No distension. There is no tenderness.  Skin: WDI  Neurologic Examination Mental Status: Alert, oriented, thought content appropriate.  Speech fluent without evidence of aphasia.  Able to follow 3 step commands without difficulty. Cranial Nerves: II: funduscopic exam wnl bilaterally, visual fields grossly normal, pupils equal, round, reactive to light and accommodation III,IV, VI: ptosis not present, extra-ocular motions intact bilaterally V,VII: smile symmetric, facial light touch sensation normal bilaterally VIII: hearing normal bilaterally IX,X: gag reflex present XI: trapezius strength/neck flexion strength normal bilaterally XII: tongue strength normal  Motor: Right : Upper extremity    Left:     Upper extremity 5/5 deltoid       5-/5 deltoid 5/5 biceps      5-/5 biceps  5/5 triceps      5-/5 triceps 5/5 hand grip      4+/5 hand grip  Lower extremity     Lower extremity 5/5 hip flexor      5/5 hip flexor 5/5 quadricep      5/5 quadriceps  5/5 hamstrings     5/5 hamstrings 5/5 plantar flexion  5/5 plantar flexion 5/5 plantar extension     5/5 plantar extension Tone and bulk:normal tone throughout; no atrophy noted Sensory: decreased LT  Deep Tendon Reflexes: 2+ and symmetric throughout Plantars: Right: downgoing   Left: downgoing Cerebellar: normal finger-to-nose, normal rapid alternating movements and normal heel-to-shin test Gait: normal gait and station  Laboratory Studies:   Basic Metabolic Panel:  Recent Labs Lab 11/13/14 2315 11/13/14 2329  NA 142 143  K 3.0* 2.9*  CL 103 104  CO2 26  --   GLUCOSE 96 95  BUN 8 6  CREATININE 0.86  0.80  CALCIUM 9.0  --     Liver Function Tests:  Recent Labs Lab 11/13/14 2315  AST 21  ALT 12  ALKPHOS 62  BILITOT 0.4  PROT 7.0  ALBUMIN 3.8   No results for input(s): LIPASE, AMYLASE in the last 168 hours. No results for input(s): AMMONIA in the last 168 hours.  CBC:  Recent Labs Lab 11/13/14 2315 11/13/14 2329  WBC 7.4  --   NEUTROABS 3.5  --   HGB 12.0 13.9  HCT 36.8 41.0  MCV 83.1  --   PLT 454*  --     Cardiac Enzymes: No results for input(s): CKTOTAL, CKMB, CKMBINDEX, TROPONINI in the last 168 hours.  BNP: Invalid input(s): POCBNP  CBG: No results for input(s): GLUCAP in the last 168 hours.  Microbiology: Results for orders placed or performed during the hospital encounter of 06/25/14  Wet prep, genital     Status: Abnormal   Collection Time: 06/25/14 12:15 PM  Result Value Ref Range Status   Yeast Wet Prep HPF POC NONE SEEN NONE SEEN Final   Trich, Wet Prep FEW (A) NONE SEEN Final   Clue Cells Wet Prep HPF POC NONE SEEN NONE SEEN Final   WBC, Wet Prep HPF POC FEW (A) NONE SEEN Final    Comment: MANY BACTERIA SEEN  GC/Chlamydia Probe Amp (multiple spec sources)     Status: None   Collection Time: 06/25/14  2:00 PM  Result Value Ref Range Status   CT Probe RNA NEGATIVE NEGATIVE Final   GC Probe RNA NEGATIVE NEGATIVE Final    Comment: (NOTE)                                                                                       **Normal Reference Range: Negative**      Assay performed using the Gen-Probe APTIMA COMBO2 (R) Assay. Acceptable specimen types for this assay include APTIMA Swabs (Unisex, endocervical, urethral, or vaginal), first void urine, and ThinPrep liquid based cytology samples. Performed at [facility]    Coagulation Studies:  Recent Labs  11/13/14 2315  LABPROT 14.0  INR 1.07    Urinalysis:  Recent Labs Lab 11/13/14 2341  COLORURINE YELLOW  LABSPEC 1.028  PHURINE 6.5  GLUCOSEU NEGATIVE  HGBUR NEGATIVE   BILIRUBINUR NEGATIVE  KETONESUR 15*  PROTEINUR NEGATIVE  UROBILINOGEN 1.0  NITRITE POSITIVE*  LEUKOCYTESUR SMALL*    Lipid Panel:  No results found for: CHOL, TRIG, HDL, CHOLHDL, VLDL, LDLCALC  HgbA1C: No results found for: HGBA1C  Urine Drug Screen:  Component Value Date/Time   LABOPIA POSITIVE* 11/13/2014 2341   COCAINSCRNUR NONE DETECTED 11/13/2014 2341   LABBENZ NONE DETECTED 11/13/2014 2341   AMPHETMU NONE DETECTED 11/13/2014 2341   THCU POSITIVE* 11/13/2014 2341   LABBARB NONE DETECTED 11/13/2014 2341    Alcohol Level:  Recent Labs Lab 11/13/14 2315  ETH <11    Other results:  Imaging: No results found.   Assessment/Plan:  43y/o woman presenting for evaluation of acute onset left upper extremity weakness and sensory loss. She has had prior episode but reports it resolved within 1 hour. She has no risk factors but would consider TIA/CVA in the differential. With age and history would also consider MS and complex migraine. Potassium of 2.9 could potentially also be contributing to her symptoms though presentation is atypical.   -MRI brain with and without contrast. If positive would complete stroke workup -supplement potassium -if MRI brain unremarkable would consider EMG/NCS referral to outpatient neurology -will follow  Jim Like, DO [facility] [phone]  If 7pm- 7am, please page neurology on call as listed in [place]. 11/14/2014, 12:40 AM

## 2014-11-14 NOTE — ED Notes (Addendum)
NIU,MD at bedside.

## 2014-11-14 NOTE — H&P (Signed)
[facility] History and Physical  Whitney Benton JIR:[id number] DOB: Aug 01, 1971 DOA: 11/13/2014  Referring physician: ED physician PCP: Maggie Font, MD  Specialists:   Chief Complaint: Left hand weakness and numbness This is HPI: Whitney Benton is a 43 y.o. female  with past medical history of depression, and tobacco abuse, who presents with left hand weakness and numbness.  This is the second episode of left hand weakness and numbness per patient. The first episode of left hand weakness and numbness happened 1 week ago, which lasted for about 1 hour, and  resolved spontaneously. Yesterday at about 4 PM, she started having left hand weakness and numbness again. This time, itis also involves left forearm. She does not have vision changes, hearing loss, weakness or numbness in her legs. She does not lose balance.  She denies fever, chills, fatigue, headaches, cough, chest pain, SOB, abdominal pain, diarrhea, constipation, dysuria, urgency, frequency, hematuria, skin rashes, or leg swelling.  Work up in the ED demonstrates  negative CT head for acute abnormalities. UDS is positive for THC, negative troponin, potassium 2.9, positive urinalysis for UTI (patient denies symptoms for UTI). Patient is admitted to inpatient for further evaluation and treatment. Neurology was consulted.  Review of Systems: As presented in the history of presenting illness, rest negative.  Where does patient live?  At home with mother Can patient participate in ADLs? Yes  Allergy:  Allergies  Allergen Reactions  . Aspirin Hives    Past Medical History  Diagnosis Date  . Depression     Past Surgical History  Procedure Laterality Date  . Cesarean section    . Vaginal hysterectomy  2006    Fibroids, menorrhagia, benign pathology    Social History:  reports that she has been smoking Cigarettes.  She has a 2 pack-year smoking history. She has never used smokeless tobacco. She reports that she drinks  alcohol. She reports that she does not use illicit drugs.  Family History:  Family History  Problem Relation Age of Onset  . Diabetes Mother   . Mental illness Mother   . Depression Mother   . Hypertension Mother   . Hypertension Father   . Diabetes Father      Prior to Admission medications   Medication Sig Start Date End Date Taking? Authorizing Provider  cyclobenzaprine (FLEXERIL) 10 MG tablet Take 1 tablet (10 mg total) by mouth 3 (three) times daily as needed for muscle spasms. 06/09/14  Yes Peter S Dammen, PA-C  ibuprofen (ADVIL,MOTRIN) 800 MG tablet Take 800 mg by mouth every 8 (eight) hours as needed for moderate pain.   Yes Historical Provider, MD  sertraline (ZOLOFT) 50 MG tablet Take 50 mg by mouth daily.   Yes Historical Provider, MD  nystatin (MYCOSTATIN/NYSTOP) 100000 UNIT/GM POWD Apply 1 g topically 3 (three) times daily as needed (rash).    Historical Provider, MD    Physical Exam: Filed Vitals:   11/14/14 0013 11/14/14 0030 11/14/14 0100 11/14/14 0230  BP:  103/60 103/73 107/61  Pulse:  73 76 74  Temp: 97.6 F (36.4 C)     Resp:  21 22 22   Height:      Weight:      SpO2:  97% 100% 93%   General: Not in acute distress HEENT:       Eyes: PERRL, EOMI, no scleral icterus       ENT: No discharge from the ears and nose, no pharynx injection, no tonsillar enlargement.  Neck: No JVD, no bruit, no mass felt. Cardiac: S1/S2, RRR, No murmurs, No gallops or rubs Pulm: Good air movement bilaterally. Clear to auscultation bilaterally. No rales, wheezing, rhonchi or rubs. Abd: Soft, nondistended, nontender, no rebound pain, no organomegaly, BS present Ext: No edema bilaterally. 2+DP/PT pulse bilaterally Musculoskeletal: No joint deformities, erythema, or stiffness, ROM full Skin: No rashes.  Neuro: Alert and oriented X3, cranial nerves II-XII grossly intact, muscle strength 4/5 in the left hand, sensation to light touch intact. Brachial reflex 2+ bilaterally. Knee  reflex 1+ bilaterally. Negative Babinski's sign. Normal finger to nose test. Psych: Patient is not psychotic, no suicidal or hemocidal ideation.  Labs on Admission:  Basic Metabolic Panel:  Recent Labs Lab 11/13/14 2315 11/13/14 2329  NA 142 143  K 3.0* 2.9*  CL 103 104  CO2 26  --   GLUCOSE 96 95  BUN 8 6  CREATININE 0.86 0.80  CALCIUM 9.0  --    Liver Function Tests:  Recent Labs Lab 11/13/14 2315  AST 21  ALT 12  ALKPHOS 62  BILITOT 0.4  PROT 7.0  ALBUMIN 3.8   No results for input(s): LIPASE, AMYLASE in the last 168 hours. No results for input(s): AMMONIA in the last 168 hours. CBC:  Recent Labs Lab 11/13/14 2315 11/13/14 2329  WBC 7.4  --   NEUTROABS 3.5  --   HGB 12.0 13.9  HCT 36.8 41.0  MCV 83.1  --   PLT 454*  --    Cardiac Enzymes: No results for input(s): CKTOTAL, CKMB, CKMBINDEX, TROPONINI in the last 168 hours.  BNP (last 3 results) No results for input(s): PROBNP in the last 8760 hours. CBG: No results for input(s): GLUCAP in the last 168 hours.  Radiological Exams on Admission: Ct Head Wo Contrast  11/14/2014   CLINICAL DATA:  43 year old female with left hand numbness radiating of to left arm. Onset this afternoon.  EXAM: CT HEAD WITHOUT CONTRAST  TECHNIQUE: Contiguous axial images were obtained from the base of the skull through the vertex without intravenous contrast.  COMPARISON:  09/16/2008  FINDINGS: No acute cortical infarct, hemorrhage, or mass lesion ispresent. Ventricles are of normal size. No significant extra-axial fluid collection is present. The paranasal sinuses andmastoid air cells are clear. The osseous skull is intact.  IMPRESSION: Normal brain.   Electronically Signed   By: Kerby Moors M.D.   On: 11/14/2014 01:55    EKG: Independently reviewed.   Assessment/Plan Principal Problem:   Left hand weakness Active Problems:   Depression   Tobacco abuse   Hypokalemia  Left hand weakness: Etiologies not clear.  Neurology was consulted. The differential diagnosis include stroke/TIA, MS and complex migraine per Dr. Janann Colonel. She has 1 risk factor for stroke (smoking). Potassium of 2.9 could potentially also be contributing to her symptoms per Dr. Janann Colonel  -will admit to tele bed - Appreciate Dr. Hazle Quant consultation, will follow up his Recs.  MRI brain with and without contrast. If positive would complete stroke workup. If MRI brain unremarkable, would consider EMG/NCS referral to outpatient neurology  - check VtB12 level, RPR, TSH, HIV ab - will not start ASA since she is allergic to ASA - check lipid panel and HbA1c  - PT/OT consult  Depression: No suicidal or homicidal ideations -Continue home Zoloft  Tobacco abuse: Smokes 1/3 pack a day for 14 years.  -I spent lengthy time in counseling about importance of quitting smoking. Patient would like to consider it seriously.  Hypokalemia:  Potassium 2.9 on admission. -Repleted with 40 mEq of potassium chloride orally, plus 310 mEq of potassium chloride by IV - 2 mg of magnesium sulfate was given by ED -repeat BMP in AM and check Mg level  Positive urinalysis for UTI: Patient is completely asymptomatic. -Will not start antibiotics -Follow-up culture.   DVT ppx: SQ Heparin   Code Status: Full code Family Communication: None at bed side.     Disposition Plan: Admit to inpatient   Date of Service 11/14/2014    Ivor Costa [facility] Pager [phone]  If 7PM-7AM, please contact night-coverage [email or web address] Password [facility] 11/14/2014, 3:16 AM

## 2014-11-14 NOTE — Plan of Care (Signed)
Problem: Acute Treatment Outcomes Goal: Airway maintained/protected Outcome: Completed/Met Date Met:  11/14/14 Goal: 02 Sats > 94% Outcome: Completed/Met Date Met:  11/14/14 Goal: Hemodynamically stable Outcome: Completed/Met Date Met:  11/14/14 Goal: tPA Patient w/o S&S of bleeding Outcome: Not Applicable Date Met:  49/49/44

## 2014-11-14 NOTE — Plan of Care (Signed)
Problem: Acute Treatment Outcomes Goal: Neuro exam at baseline or improved Outcome: Completed/Met Date Met:  11/14/14

## 2014-11-14 NOTE — Progress Notes (Signed)
Discharge orders received. Pt for discharge home today. IV d/c'd. Pt given discharge instructions and prescriptions with verbalized understanding. Family in room to assist with discharge. Staff brought pt downstairs via wheelchair.   

## 2014-11-14 NOTE — Progress Notes (Signed)
PATIENT DETAILS Name: Whitney Benton Age: 43 y.o. Sex: female Date of Birth: [birth year] Admit Date: 11/13/2014 Admitting Physician Ivor Costa, MD [id number] K, MD  Subjective: Continues to have left arm weakness and decreased sensation.  Assessment/Plan: Principal Problem:   Left hand weakness: Admitted with left upper extremity weakness/decreased sensation. Claims to have had a headache for 3 days last week, has a prior history of migraine headaches. CT head negative for acute abnormalities. Not sure exactly what the etiology is-acute CVA, hemiplegic migraine, and multiple sclerosis are in the differential. Await MRI brain and MRI C-spine. Neurology following. Vitamin B12, TSH within normal limits and HIV negative.  Active Problems:   Depression: Continue Zoloft.    Tobacco abuse: Counseled.    Hypokalemia: Repleted, recheck in a.m.  Disposition: Remain inpatient  Antibiotics:  Anti-infectives    None      DVT Prophylaxis: Prophylactic Heparin  Code Status: Full code   Family Communication None at bedside-patient awake, alert and aware of above plan  Procedures: None  CONSULTS:  neurology  Time spent 40 minutes-which includes 50% of the time with face-to-face with patient/ family and coordinating care related to the above assessment and plan.  MEDICATIONS: Scheduled Meds: . heparin  5,000 Units Subcutaneous 3 times per day  . sertraline  50 mg Oral Daily   Continuous Infusions:  PRN Meds:.acetaminophen, senna-docusate    PHYSICAL EXAM: Vital signs in last 24 hours: Filed Vitals:   11/14/14 0300 11/14/14 0500 11/14/14 0700 11/14/14 1109  BP: 123/77 116/83 118/64 149/78  Pulse: 75 77 74 72  Temp:  98.1 F (36.7 C) 98.2 F (36.8 C) 98.4 F (36.9 C)  TempSrc:  Oral Oral Oral  Resp: 18 18 18 20   Height:      Weight:      SpO2: 95% 97% 98% 98%    Weight change:  Filed Weights   11/13/14 2213  Weight: 83.915 kg (185 lb)   Body  mass index is 32.78 kg/(m^2).   Gen Exam: Awake and alert with clear speech.   Neck: Supple, No JVD.   Chest: B/L Clear.   CVS: S1 S2 Regular, no murmurs.  Abdomen: soft, BS +, non tender, non distended. Extremities: no edema, lower extremities warm to touch. Neurologic: LUE-4/5,decreased crude touch all over LUE  Skin: No Rash.   Wounds: N/A.    Intake/Output from previous day:  Intake/Output Summary (Last 24 hours) at 11/14/14 1238 Last data filed at 11/14/14 0801  Gross per 24 hour  Intake    390 ml  Output      0 ml  Net    390 ml     LAB RESULTS: CBC  Recent Labs Lab 11/13/14 2315 11/13/14 2329  WBC 7.4  --   HGB 12.0 13.9  HCT 36.8 41.0  PLT 454*  --   MCV 83.1  --   MCH 27.1  --   MCHC 32.6  --   RDW 14.9  --   LYMPHSABS 3.0  --   MONOABS 0.8  --   EOSABS 0.1  --   BASOSABS 0.0  --     Chemistries   Recent Labs Lab 11/13/14 2315 11/13/14 2329 11/14/14 0707  NA 142 143 142  K 3.0* 2.9* 3.7  CL 103 104 108  CO2 26  --  22  GLUCOSE 96 95 103*  BUN 8 6 7   CREATININE 0.86 0.80 0.74  CALCIUM 9.0  --  8.4  MG  --   --  2.3    CBG: No results for input(s): GLUCAP in the last 168 hours.  GFR Estimated Creatinine Clearance: 92.1 mL/min (by C-G formula based on Cr of 0.74).  Coagulation profile  Recent Labs Lab 11/13/14 2315  INR 1.07    Cardiac Enzymes No results for input(s): CKMB, TROPONINI, MYOGLOBIN in the last 168 hours.  Invalid input(s): CK  Invalid input(s): POCBNP No results for input(s): DDIMER in the last 72 hours. No results for input(s): HGBA1C in the last 72 hours.  Recent Labs  11/14/14 0707  CHOL 146  HDL 46  LDLCALC 81  TRIG 97  CHOLHDL 3.2    Recent Labs  11/14/14 0707  TSH 2.690    Recent Labs  11/14/14 0707  VITAMINB12 490   No results for input(s): LIPASE, AMYLASE in the last 72 hours.  Urine Studies No results for input(s): UHGB, CRYS in the last 72 hours.  Invalid input(s): UACOL, UAPR,  USPG, UPH, UTP, UGL, UKET, UBIL, UNIT, UROB, ULEU, UEPI, UWBC, URBC, UBAC, CAST, UCOM, BILUA  MICROBIOLOGY: No results found for this or any previous visit (from the past 240 hour(s)).  RADIOLOGY STUDIES/RESULTS: Ct Head Wo Contrast  11/14/2014   CLINICAL DATA:  43 year old female with left hand numbness radiating of to left arm. Onset this afternoon.  EXAM: CT HEAD WITHOUT CONTRAST  TECHNIQUE: Contiguous axial images were obtained from the base of the skull through the vertex without intravenous contrast.  COMPARISON:  09/16/2008  FINDINGS: No acute cortical infarct, hemorrhage, or mass lesion ispresent. Ventricles are of normal size. No significant extra-axial fluid collection is present. The paranasal sinuses andmastoid air cells are clear. The osseous skull is intact.  IMPRESSION: Normal brain.   Electronically Signed   By: Kerby Moors M.D.   On: 11/14/2014 01:55    Oren Binet, MD  [facility] Pager:[phone]  If 7PM-7AM, please contact night-coverage [email or web address] Password [email or web address] 11/14/2014, 12:38 PM   LOS: 1 day

## 2014-11-14 NOTE — Progress Notes (Signed)
Utilization review completed. Kyilee Gregg, RN, BSN. 

## 2014-11-14 NOTE — Plan of Care (Signed)
Problem: Acute Treatment Outcomes Goal: BP within ordered parameters Outcome: Completed/Met Date Met:  11/14/14

## 2014-11-14 NOTE — Progress Notes (Signed)
Pt transferred to unit from ED via NT x 1. Pt alert and oriented upon arrival. No signs or symptoms of acute distress. No complaints of pain or discomfort. Pt connected to telemetry and central monitoring notified. Pt oriented to unit and procedures. Pt now resting in bed at lowest position, bed alarm on, with call light in reach. Will continue to monitor. Fortino Sic, RN, BSN 11/14/2014 3:13 AM

## 2014-11-14 NOTE — Care Management (Signed)
ED CM spoke Dr. Lovena Neighbours concerning recommendation for OP PT. Reviewed record, spoke with patient by phone  concerning the recommendation, patient is agreeable with disposition plan. Faxed in referral to [phone], received fax confirmation. Explained to patient that someone will call her at the number verified to schedule appt for therapy, provided map with contact information for patient if any questions or concerns should arise. Pt verbalized understanding teach back done. Updated Almyra Free RN on Unit. No further CM needs identified.

## 2014-11-14 NOTE — Discharge Summary (Signed)
PATIENT DETAILS Name: Whitney Benton Age: 43 y.o. Sex: female Date of Birth: 11/16/1971 MRN: [id number]. Admitting Physician: Ivor Costa, MD [id number] K, MD  Admit Date: 11/13/2014 Discharge date: 11/14/2014  Recommendations for Outpatient Follow-up:  1. Please ensure patient has follow up with Neurology.  PRIMARY DISCHARGE DIAGNOSIS:  Principal Problem:   Left hand weakness Active Problems:   Depression   Tobacco abuse   Hypokalemia      PAST MEDICAL HISTORY: Past Medical History  Diagnosis Date  . Depression     DISCHARGE MEDICATIONS: Current Discharge Medication List    START taking these medications   Details  traMADol (ULTRAM) 50 MG tablet Take 1 tablet (50 mg total) by mouth every 6 (six) hours as needed. Qty: 30 tablet, Refills: 0      CONTINUE these medications which have NOT CHANGED   Details  cyclobenzaprine (FLEXERIL) 10 MG tablet Take 1 tablet (10 mg total) by mouth 3 (three) times daily as needed for muscle spasms. Qty: 30 tablet, Refills: 0    ibuprofen (ADVIL,MOTRIN) 800 MG tablet Take 800 mg by mouth every 8 (eight) hours as needed for moderate pain.    sertraline (ZOLOFT) 50 MG tablet Take 50 mg by mouth daily.    nystatin (MYCOSTATIN/NYSTOP) 100000 UNIT/GM POWD Apply 1 g topically 3 (three) times daily as needed (rash).        ALLERGIES:   Allergies  Allergen Reactions  . Aspirin Hives    BRIEF HPI:  See H&P, Labs, Consult and Test reports for all details in brief, patient was admitted for evaluation of left arm weakness. Please see H&P for further details.  CONSULTATIONS:   neurology  PERTINENT RADIOLOGIC STUDIES: Ct Head Wo Contrast  11/14/2014   CLINICAL DATA:  43 year old female with left hand numbness radiating of to left arm. Onset this afternoon.  EXAM: CT HEAD WITHOUT CONTRAST  TECHNIQUE: Contiguous axial images were obtained from the base of the skull through the vertex without intravenous contrast.  COMPARISON:   09/16/2008  FINDINGS: No acute cortical infarct, hemorrhage, or mass lesion ispresent. Ventricles are of normal size. No significant extra-axial fluid collection is present. The paranasal sinuses andmastoid air cells are clear. The osseous skull is intact.  IMPRESSION: Normal brain.   Electronically Signed   By: Kerby Moors M.D.   On: 11/14/2014 01:55   Mr Jeri Cos WL Contrast  11/14/2014   CLINICAL DATA:  Left hand weakness  EXAM: MRI HEAD WITHOUT AND WITH CONTRAST  MRI CERVICAL SPINE WITHOUT AND WITH CONTRAST  TECHNIQUE: Multiplanar, multiecho pulse sequences of the brain and surrounding structures, and cervical spine, to include the craniocervical junction and cervicothoracic junction, were obtained without and with intravenous contrast.  CONTRAST:  11mL MULTIHANCE GADOBENATE DIMEGLUMINE 529 MG/ML IV SOLN  COMPARISON:  CT head 11/14/2014  FINDINGS: MRI HEAD FINDINGS  Ventricle size is normal. Cerebral volume is normal. Pituitary normal in size.  Multiple small hyperintensities are seen throughout the subcortical and deep white matter bilaterally and symmetrically. Brainstem and cerebellum are normal. Basal ganglia normal  Negative for acute infarct.  Negative for hemorrhage.  Negative for mass or edema.  Normal enhancement following contrast infusion.  MRI CERVICAL SPINE FINDINGS  Normal cervical alignment. Negative for fracture or mass. No enhancing lesions postcontrast administration. Negative for cord compression. No cord lesion identified.  C2-3:  Negative  C3-4: Mild disc degeneration. Minimal central disc protrusion without stenosis.  C4-5: Mild disc degeneration with early uncinate spurring and mild facet  degeneration. No significant spinal stenosis.  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 Bilateral facet hypertrophy with mild spinal stenosis and mild foraminal stenosis bilaterally.  C6-7: Mild disc degeneration with disc bulging and  spondylosis. No significant spinal stenosis  C7-T1: Negative  IMPRESSION: No acute infarct. Multiple small white matter hyperintensities predominately in the subcortical and deep white matter. This pattern is most compatible with chronic microvascular ischemia, correlate with risk factors. Differential includes migraine headaches, vasculitis, and demyelinating disease.  Cervical disc degeneration as above without neural impingement.   Electronically Signed   By: Franchot Gallo M.D.   On: 11/14/2014 14:03   Mr Cervical Spine W Wo Contrast  11/14/2014   CLINICAL DATA:  Left hand weakness  EXAM: MRI HEAD WITHOUT AND WITH CONTRAST  MRI CERVICAL SPINE WITHOUT AND WITH CONTRAST  TECHNIQUE: Multiplanar, multiecho pulse sequences of the brain and surrounding structures, and cervical spine, to include the craniocervical junction and cervicothoracic junction, were obtained without and with intravenous contrast.  CONTRAST:  48mL MULTIHANCE GADOBENATE DIMEGLUMINE 529 MG/ML IV SOLN  COMPARISON:  CT head 11/14/2014  FINDINGS: MRI HEAD FINDINGS  Ventricle size is normal. Cerebral volume is normal. Pituitary normal in size.  Multiple small hyperintensities are seen throughout the subcortical and deep white matter bilaterally and symmetrically. Brainstem and cerebellum are normal. Basal ganglia normal  Negative for acute infarct.  Negative for hemorrhage.  Negative for mass or edema.  Normal enhancement following contrast infusion.  MRI CERVICAL SPINE FINDINGS  Normal cervical alignment. Negative for fracture or mass. No enhancing lesions postcontrast administration. Negative for cord compression. No cord lesion identified.  C2-3:  Negative  C3-4: Mild disc degeneration. Minimal central disc protrusion without stenosis.  C4-5: Mild disc degeneration with early uncinate spurring and mild facet degeneration. No significant spinal stenosis.  C5-6: Disc degeneration and spondylosis left greater than right with diffuse uncinate  spurring. Bilateral facet hypertrophy with mild spinal stenosis and mild foraminal stenosis bilaterally.  C6-7: Mild disc degeneration with disc bulging and spondylosis. No significant spinal stenosis  C7-T1: Negative  IMPRESSION: No acute infarct. Multiple small white matter hyperintensities predominately in the subcortical and deep white matter. This pattern is most compatible with chronic microvascular ischemia, correlate with risk factors. Differential includes migraine headaches, vasculitis, and demyelinating disease.  Cervical disc degeneration as above without neural impingement.   Electronically Signed   By: Franchot Gallo M.D.   On: 11/14/2014 14:03     PERTINENT LAB RESULTS: CBC:  Recent Labs  11/13/14 2315 11/13/14 2329  WBC 7.4  --   HGB 12.0 13.9  HCT 36.8 41.0  PLT 454*  --    CMET CMP     Component Value Date/Time   NA 142 11/14/2014 0707   K 3.7 11/14/2014 0707   CL 108 11/14/2014 0707   CO2 22 11/14/2014 0707   GLUCOSE 103* 11/14/2014 0707   BUN 7 11/14/2014 0707   CREATININE 0.74 11/14/2014 0707   CALCIUM 8.4 11/14/2014 0707   PROT 7.0 11/13/2014 2315   ALBUMIN 3.8 11/13/2014 2315   AST 21 11/13/2014 2315   ALT 12 11/13/2014 2315   ALKPHOS 62 11/13/2014 2315   BILITOT 0.4 11/13/2014 2315   GFRNONAA >90 11/14/2014 0707   GFRAA >90 11/14/2014 0707    GFR Estimated Creatinine Clearance: 92.1 mL/min (by C-G formula based on Cr of 0.74). No results for input(s): LIPASE, AMYLASE in the last 72 hours. No results for input(s): CKTOTAL,  CKMB, CKMBINDEX, TROPONINI in the last 72 hours. Invalid input(s): POCBNP No results for input(s): DDIMER in the last 72 hours.  Recent Labs  11/14/14 0707  HGBA1C 5.8*    Recent Labs  11/14/14 0707  CHOL 146  HDL 46  LDLCALC 81  TRIG 97  CHOLHDL 3.2    Recent Labs  11/14/14 0707  TSH 2.690    Recent Labs  11/14/14 0707  VITAMINB12 490   Coags:  Recent Labs  11/13/14 2315  INR 1.07    Microbiology: No results found for this or any previous visit (from the past 240 hour(s)).   BRIEF HOSPITAL COURSE:  Left hand weakness: Admitted with left upper extremity weakness/decreased sensation. Claims to have had a headache for 3 days last week, has a prior history of migraine headaches. CT head negative for acute abnormalities. MRI Brain and MRI C Spine was negative for acute abnormalities. Seen by Neuro on follow up on 11/14/14 evening, recommendations are to follow up with outpatient Neurology, no further investigations required inpatient. Neurology recommending discharge. Patient continues to have mild weakness/numbness of her LUE.Per Neurology, likely could be a peripheral pathology. Since currently after hours, I have provided the patient tel no for [facility] Neurology. I have asked her to call tomorrow am and make an appt with a MD of her choosing. She has claimed understanding. I hve also spoken with case management to see if outpatient PT can be set up  Active Problems:  Depression: Continue Zoloft.   Tobacco abuse: Counseled.   Hypokalemia: Repleted   TODAY-DAY OF DISCHARGE:  Subjective:   Shamiyah Ngu today has no headache,no chest abdominal pain,no new weakness tingling or numbness, feels much better wants to go home today.   Objective:   Blood pressure 142/88, pulse 73, temperature 98.4 F (36.9 C), temperature source Oral, resp. rate 18, height 5\' 3"  (1.6 m), weight 83.915 kg (185 lb), SpO2 99 %.  Intake/Output Summary (Last 24 hours) at 11/14/14 1725 Last data filed at 11/14/14 0801  Gross per 24 hour  Intake    390 ml  Output      0 ml  Net    390 ml   Filed Weights   11/13/14 2213  Weight: 83.915 kg (185 lb)    Exam Awake Alert, Oriented *3, No new F.N deficits, Normal affect [place].AT,PERRAL Supple Neck,No JVD, No cervical lymphadenopathy appriciated.  Symmetrical Chest wall movement, Good air movement bilaterally, CTAB RRR,No Gallops,Rubs or new  Murmurs, No Parasternal Heave +ve B.Sounds, Abd Soft, Non tender, No organomegaly appriciated, No rebound -guarding or rigidity. No Cyanosis, Clubbing or edema, No new Rash or bruise  DISCHARGE CONDITION: Stable  DISPOSITION:  Home  DISCHARGE INSTRUCTIONS:    Activity:  As tolerated   Diet recommendation: Regular Diet   Discharge Instructions    Call MD for:  severe uncontrolled pain    Complete by:  As directed      Diet - low sodium heart healthy    Complete by:  As directed      Increase activity slowly    Complete by:  As directed            Follow-up Information    Follow up with HILL,GERALD K, MD. Schedule an appointment as soon as possible for a visit in 5 days.   Specialty:  Family Medicine   Contact information:   [address] [place] [address] [phone]       Follow up with [facility]  NEUROLOGY.   Why:  Please call office tomorrow and make an appointment with a MD of your choice. Please let office know that you were hospitlaized.   Contact information:   [address] [place] [phone]        Total Time spent on discharge equals 45 minutes.  SignedOren Binet 11/14/2014 5:25 PM

## 2014-11-14 NOTE — Progress Notes (Signed)
Patient seen and MRI brain and cervical spine reviewed. She said that she is still having a mild HA and numbness left hand up to the shoulder. Cervical spine MRI shows no cord involvement. 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 Bilateral facet hypertrophy with mild spinal stenosis and mild foraminal stenosis bilaterally. MRI brain with and without contrast revealed multiple non specific small hyperintensities throughout the subcortical and deep white matter bilaterally and symmetrically. Brainstem and cerebellum are normal. Overall findings are not quite consistent with a demyelinating disorder but probable due to small vessel disease (patient is a heavy smoker, a has migraine). She said that she had a spinal tap done in 2005 that was negative. I can not entirely exclude a peripheral process, as MS seems unlikely. A repeat MRI in 3 months will be prudent. Patient can be discharge today and have outpatient neurology follow up in 2-3 weeks or before if her symptoms persist or worsen. She agreed.  Dorian Pod, MD [facility]

## 2014-11-23 ENCOUNTER — Ambulatory Visit: Payer: Medicaid Other | Attending: Internal Medicine | Admitting: Occupational Therapy

## 2014-11-23 ENCOUNTER — Encounter: Payer: Self-pay | Admitting: Occupational Therapy

## 2014-11-23 DIAGNOSIS — M6281 Muscle weakness (generalized): Secondary | ICD-10-CM

## 2014-11-23 DIAGNOSIS — R200 Anesthesia of skin: Secondary | ICD-10-CM

## 2014-11-23 DIAGNOSIS — R208 Other disturbances of skin sensation: Secondary | ICD-10-CM | POA: Insufficient documentation

## 2014-11-23 DIAGNOSIS — R279 Unspecified lack of coordination: Secondary | ICD-10-CM | POA: Insufficient documentation

## 2014-11-23 DIAGNOSIS — R278 Other lack of coordination: Secondary | ICD-10-CM

## 2014-11-23 DIAGNOSIS — M79602 Pain in left arm: Secondary | ICD-10-CM

## 2014-11-23 NOTE — Patient Instructions (Signed)
Pt is a 43 y/o RHD female w/ c/o LUE weakness and pain. She reports that she went to ER at [facility] on 11/13/14 w/ LUE numbness and headache. She was admitted to R/O CVA on 11/13/14 and d/c on 11/14/14 after results from CT, MRI of Brain and MRI of C Spine were all negative. She presents to OPOT today for evaluation and treatment. She has not currently scheduled an appointment to f/u with her neurologist and reports that no medications that she has been given help her with pain throughout her LUE. She states that her symptoms have not changed since she was d/c from the hospital. She reports pain 7-8/10 all the way up to 10/10 at times and describes it as sharp/shooting and numb. She reports that she has been afraid to go to work b/c she "has to lift things." Pt was educated to schedule f/u w/ neurology ASAP as per her d/c instructions at the hospital on 11/14/14, she verbalized understanding of this. Pt's LUE was noted to be cool to the touch when compared with RUE. During sensibility assessment, pt indicated that she "couldn't feel that" and "don't feel that" each time she was touched with monofilaments and light touch via therapist with her eyes occluded. LUE appears to feel asleep/numb to pt. She reports that ADL's generally/currently take her increased time at Mod I level but that she does occasionally require min assistance for some fine motor activities, from her parents whom she is currently living with (ie: buttons, tying shoes an sometimes opening items). Pt may benefit from out-pt OT to address decreased functional use and coordination LUE, as well as f/u with her neurologist and possible nerve conduction studies to her left upper extremity to r/o a compression.

## 2014-11-23 NOTE — Therapy (Signed)
[facility] [address] [place], [place], [address] Phone: [phone]   Fax:  [phone]  Occupational Therapy Evaluation  Patient Details  Name: Whitney Benton MRN: [id number] Date of Birth: 1971/01/14  Encounter Date: 11/23/2014      OT End of Session - 11/23/14 1242    Visit Number 1  Medicaid Pending    Number of Visits --  2x/week x6 weeks recommended   Date for OT Re-Evaluation 01/04/15  01/04/15   Authorization Type Pt is Medicaid Pending  Pt has signed self pay waiver, however she is concerned with getting bill for therapy. Despite this, she has elected to schedule f/u at this time.   OT Start Time [phone]   OT Stop Time 1025   OT Time Calculation (min) 38 min   Activity Tolerance Patient limited by pain   Behavior During Therapy Restless;Anxious      Past Medical History  Diagnosis Date  . Depression     Past Surgical History  Procedure Laterality Date  . Cesarean section    . Vaginal hysterectomy  2006    Fibroids, menorrhagia, benign pathology    There were no vitals taken for this visit.  Visit Diagnosis:  Left arm pain - Plan: Ot plan of care cert/re-cert  Decreased coordination - Plan: Ot plan of care cert/re-cert  Left arm numbness - Plan: Ot plan of care cert/re-cert  Generalized muscle weakness - Plan: Ot plan of care cert/re-cert      Subjective Assessment - 11/23/14 1028    Symptoms Pt reports that on 11/13/14 she began to have numbness LUE and presented to ER at [facility] hospital. She reports that she was an in-pt until 11/14/14 and was d/c w/ dx LUE weakness, CVA was ruled out, CT was negative, MRI Brain and MRI C-spine both negative. She reports 7-8/10 pain LUE and states that this has not gotten better since symptoms started.   Patient Stated Goals Get rid of this pain and "Get my arm back in shape" left.   Currently in Pain? Yes   Pain Score 7    Pain Location Arm   Pain Orientation Left    Pain Descriptors / Indicators Numbness   Pain Type --  Greater than 1-2 weeks   Pain Onset 1 to 4 weeks ago   Pain Frequency Constant   Aggravating Factors  Heat or Cold   Pain Relieving Factors Nothing   Multiple Pain Sites No    Pt is a 43 y/o RHD female w/ c/o LUE weakness and pain. She reports that she went to ER at [facility] on 11/13/14 w/ LUE numbness and headache. She was admitted to R/O CVA on 11/13/14 and d/c on 11/14/14 after results from CT, MRI of Brain and MRI of C Spine were all negative. She presents to OPOT today for evaluation and treatment. She has not currently scheduled an appointment to f/u with her neurologist and reports that no medications that she has been given help her with pain throughout her LUE. She states that her symptoms have not changed since she was d/c from the hospital. She reports pain 7-8/10 all the way up to 10/10 at times and describes it as sharp/shooting and numb. She reports that she has been afraid to go to work b/c she "has to lift things." Pt was educated to schedule f/u w/ neurology ASAP as per her d/c instructions at the hospital on 11/14/14, she verbalized understanding of this. Pt's LUE was noted  to be cool to the touch when compared with RUE. During sensibility assessment, pt indicated that she "couldn't feel that" and "don't feel that" each time she was touched with monofilaments and light touch via therapist with her eyes occluded. LUE appears to feel asleep/numb to pt. She reports that ADL's generally/currently take her increased time at Mod I level but that she does occasionally require min assistance for some fine motor activities, from her parents whom she is currently living with (ie: buttons, tying shoes an sometimes opening items). Pt may benefit from out-pt OT to address decreased functional use and coordination LUE, as well as f/u with her neurologist and possible nerve conduction studies to her left upper extremity to r/o a compression.        [facility] OT Assessment - 11/23/14 [place] expects to be discharged to: Private residence   Living Arrangements Other(Comment)  Living with parents    Available Help at Discharge Family   Type of [place]  2 [place] One level   Bathroom Shower/Tub Tub/Shower unit;Walk-in Water quality scientist --  None   Home Equipment None   Lives With Family  Currently lives with parents   Prior Function   Level of Independence Independent with basic ADLs;Needs assistance with ADLs   Comments Afraid to go back to work at [employer] "b/c I have to lift things"   ADL   Grooming Set up   Grooming Patient Percentage --  Pt reports assist opening toothpaste sometime   Upper Body Bathing Modified independent   Lower Body Bathing Modified independent   Upper Body Dressing Set up;Increased time  Occasional assist buttoning buttons, FM tasks   Lower Body Dressing Set up;Increased time   Toilet Tranfer Modified independent   Tub/Shower Transfer Modified independent   Activity Tolerance   Activity Tolerance --  Pain limits function and all activity as observed in clinic    Sensation   Light Touch Impaired Detail  Pt reports no feeling in L hand due to c/o numbness   Light Touch Impaired Details Impaired LUE  Pt verbalized "Can't feel that" each time she was touched L    Thornell Mule Monofilament Scale --  Pt stated "Can't feel that" each time she was touched.   Additional Comments --  Pt LUE was noted as cool to the touch vs R   Coordination   Gross Motor Movements are Fluid and Coordinated Yes   Fine Motor Movements are Fluid and Coordinated Yes  Pt moves slowly and verbalizes pain with AROM   9 Hole Peg Test --  R=24.66 seconds L=45.66 seconds   Other --  AROM WFL's left and right UE's. Pt is RHD   Hand Function   Left Hand Gross Grasp Functional   Coordination Impaired  Appears functional  however impaired by pain L   Comments Pt reports pain significantly limits function in left hand.            OT Education - 11/23/14 1241    Education provided Yes   Education Details Role of OT, Pt was educated to f/u w/ MD/neurologist.   Person(s) Educated Patient   Methods Explanation;Verbal cues   Comprehension Verbalized understanding;Need further instruction          OT Short Term Goals - 11/23/14 1255    OT SHORT TERM GOAL #1   Title  Pt will be Mod I HEP   Baseline Req vc's  Assess 12/14/14   Time 3   Period Weeks   Status New   OT SHORT TERM GOAL #2   Title Pt will reports decreased pain LUE during light functional use to 6/10 or less   Baseline 7-8/10 up to 10/10  Reassess 12/14/14   Time 3   Period Weeks   Status New   OT SHORT TERM GOAL #3   Title Pt will be Mod grooming    Baseline Pt reports increaesd time - min A currently (opening toothpaste depending on pain)   Time 3   Period Weeks   Status New          OT Long Term Goals - 11/23/14 1258    OT LONG TERM GOAL #1   Title Pt will be Mod I upgraded HEP LUE   Baseline --  Assess 01/04/15   Time 6   Period Weeks   Status New   OT LONG TERM GOAL #2   Title Pt will repotrs Mod I light homemaking and ADL's using LUE as non-dominant hand   Baseline --  Assess 01/04/15   Time 6   Period Weeks   Status New   OT LONG TERM GOAL #3   Title Pt will report improved coordination as seen by improved 9 hole peg test score left by 5-10 seconds or more.   Baseline Assess 01/04/15  L=45.66, R = 24.66 seconds   Time 6   Period Weeks   Status New   OT LONG TERM GOAL #4   Title Pt will reports decreased pain LUE during functional activities to 3-4/10 or less   Baseline Assess 01/04/15  7-8 up to 10/10 LUE   Time 6   Period Weeks   Status New          Plan - 11/23/14 1247    Clinical Impression Statement Pt is a 43y/o RHD female w/ Dx LUE weakness and pain. 11/13/14-11/14/14 was admitted to  hospital where CVA was r/o. CT head was negative, MRI of Brain and MRI of C Spine were also negative. Pt presents with decreased functional use LUE, significant pain ranging from 7-8/10 to 10/10 per her report. She should benefit from OPOT to address deficits with functional use LUE, coordination and generalized weakness. Pt should also schedule f/u ASAP w/ neurologist as per her hospital d/c summary (pt stated she has not done this yet).    Rehab Potential Fair   Clinical Impairments Affecting Rehab Potential Pain limiting functional use LUE; decreased coordination, generalized muscle weakness, ? nerve compression LUE.   OT Frequency Other (comment)  1-2x/week for up to 6 weeks. Pt is Medicaid pending and is concerned with paying bills.   OT Duration 6 weeks   OT Treatment/Interventions Self-care/ADL training;Fluidtherapy;DME and/or AE instruction;Neuromuscular education;Therapeutic exercise;Manual Therapy;Therapeutic activities;Therapeutic exercises;Patient/family education   Plan 1-2x/week for up to 6 weeks (Note pt is Medicaid pending and is concerned with paying bills), instruction in AROM/home program LUE and recriprocal use. R/O nerve compression at elbow/wrist via provocative testing as able.   Recommended Other Services F/U with neurologist ASAP, pt has not yet scheduled per her report.   Consulted and Agree with Plan of Care Patient                               Problem List Patient Active Problem List   Diagnosis Date Noted  .  Left hand weakness 11/14/2014  . Tobacco abuse 11/14/2014  . Hypokalemia 11/14/2014  . Depression   . Mixed incontinence 06/27/2014  . History of TVH in 2006 for fibroids and menorrhagia; benign pathology 09/08/2011    Almyra Deforest, OT 11/23/2014, 1:07 PM

## 2014-12-06 ENCOUNTER — Encounter: Payer: Self-pay | Admitting: Neurology

## 2014-12-06 ENCOUNTER — Telehealth: Payer: Self-pay | Admitting: Occupational Therapy

## 2014-12-06 ENCOUNTER — Ambulatory Visit: Payer: Medicaid Other | Admitting: Occupational Therapy

## 2014-12-06 ENCOUNTER — Ambulatory Visit (INDEPENDENT_AMBULATORY_CARE_PROVIDER_SITE_OTHER): Payer: Self-pay | Admitting: Neurology

## 2014-12-06 VITALS — BP 119/82 | HR 79 | Ht 63.0 in | Wt 186.6 lb

## 2014-12-06 DIAGNOSIS — M79602 Pain in left arm: Secondary | ICD-10-CM

## 2014-12-06 DIAGNOSIS — IMO0001 Reserved for inherently not codable concepts without codable children: Secondary | ICD-10-CM

## 2014-12-06 DIAGNOSIS — R209 Unspecified disturbances of skin sensation: Secondary | ICD-10-CM

## 2014-12-06 DIAGNOSIS — R29898 Other symptoms and signs involving the musculoskeletal system: Secondary | ICD-10-CM

## 2014-12-06 NOTE — Patient Instructions (Signed)
Overall you are doing fairly well but I do want to suggest a few things today:   Remember to drink plenty of fluid, eat healthy meals and do not skip any meals. Try to eat protein with a every meal and eat a healthy snack such as fruit or nuts in between meals. Try to keep a regular sleep-wake schedule and try to exercise daily, particularly in the form of walking, 20-30 minutes a day, if you can.   As far as diagnostic testing: EMG/NCS of the left arm  I would like to see you back within a week for emg/ncs, sooner if we need to. Please call us with any interim questions, concerns, problems, updates or refill requests.   Please also call us for any test results so we can go over those with you on the phone.  My clinical assistant and will answer any of your questions and relay your messages to me and also relay most of my messages to you.   Our phone number is [phone]. We also have an after hours call service for urgent matters and there is a physician on-call for urgent questions. For any emergencies you know to call 911 or go to the nearest emergency room

## 2014-12-06 NOTE — Telephone Encounter (Signed)
Pt not available by phone and mother did not know how to reach her.  Left message that pt missed her appt and asked that she call to inform us if she would be returning for follow up appts.  Would like to inform pt when she calls that she is approved for 12 visits however that we strongly recommend that she see follow up neurologist as recommended when she was discharged from the hospital prior to returning to therapy.  Pt has 12 visits approved through 01/08/2015 therefore will need to ask for Medicaid extension if patient wishes to pursue follow up therapy.

## 2014-12-06 NOTE — Progress Notes (Signed)
[facility]    Provider:  Dr Jaynee Eagles Referring Provider: Iona Beard, MD Primary Care Physician:  Maggie Font, MD  CC:  Left hand pain and weakness  HPI:  Whitney Benton is a 43 y.o. female here as a referral from Dr. Berdine Addison for left hand pain and weakness  Patient was admitted in November for left arm pain. Started a month ago, she was just talking to people one day and she noticed it. The coldness and pain is in all 5 dingers, the whole hand and radiates all the way up her arm. Right arm not affected. Her left arm is weak, hurts to be touched or grabbed. She has to sleep a certain position or it hurts. In the middle of the night it wakes her up and she tries to shake it out, has pins and needles. At its worst it is a 10/10 pain. It is worse when she uses it. No neck pain or radicular neck pain. No symptoms in any of the other extremities. Symptoms started a month ago and getting worse. Continuous. She has migraines and sometimes she sees "stars".  No vision changes, no speech problems, no other neurologic complaints.   Reviewed notes, labs and imaging from outside physicians, which showed: personally reviewed MRi of the brain with chronic WM changes likely ischemic and non-specific, otherwise unremarkable. Cervical spine MRI shows no cord involvement. 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 Bilateral facet hypertrophy with mild spinal stenosis and mild foraminal stenosis bilaterally. HgbA1c 5.8, B12 490, TSH WNL, RPR/HIV WNL. Was seen at [facility] 11/23 for acute onset, associated headache, evaluated for TIA/CVA vs MS or complicated migraine.   Review of Systems: Patient complains of symptoms per HPI as well as the following symptoms: headache, numbness, weakness, dizziness, depression,anxiety. Pertinent negatives per HPI. All others negative.   History   Social History  . Marital Status: Single    Spouse Name: N/A   Number of Children: 3  . Years of Education: 14   Occupational History  . Not on file.   Social History Main Topics  . Smoking status: Current Every Day Smoker -- 0.25 packs/day for 8 years    Types: Cigarettes  . Smokeless tobacco: Never Used  . Alcohol Use: Yes     Comment: occasional  . Drug Use: No  . Sexual Activity: Yes    Birth Control/ Protection: Surgical   Other Topics Concern  . Not on file   Social History Narrative   Patient lives at home with mother and father    Patient has 3 children    Patient is single   Patient has 14 years of education    Patient is right handed     Family History  Problem Relation Age of Onset  . Diabetes Mother   . Mental illness Mother   . Depression Mother   . Hypertension Mother   . Hypertension Father   . Diabetes Father     Past Medical History  Diagnosis Date  . Depression   . Anxiety     Past Surgical History  Procedure Laterality Date  . Cesarean section    . Vaginal hysterectomy  2006    Fibroids, menorrhagia, benign pathology    Current Outpatient Prescriptions  Medication Sig Dispense Refill  . cyclobenzaprine (FLEXERIL) 10 MG tablet Take 1 tablet (10 mg total) by mouth 3 (three) times daily as needed for muscle spasms. 30 tablet 0  .  ibuprofen (ADVIL,MOTRIN) 800 MG tablet Take 800 mg by mouth every 8 (eight) hours as needed for moderate pain.    Marland Kitchen nystatin (MYCOSTATIN/NYSTOP) 100000 UNIT/GM POWD Apply 1 g topically 3 (three) times daily as needed (rash).    . sertraline (ZOLOFT) 50 MG tablet Take 50 mg by mouth daily.     No current facility-administered medications for this visit.    Allergies as of 12/06/2014 - Review Complete 12/06/2014  Allergen Reaction Noted  . Aspirin Hives 09/06/2011    Vitals: BP 119/82 mmHg  Pulse 79  Ht 5\' 3"  (1.6 m)  Wt 186 lb 9.6 oz (84.641 kg)  BMI 33.06 kg/m2 Last Weight:  Wt Readings from Last 1 Encounters:  12/06/14 186 lb 9.6 oz (84.641 kg)   Last Height:     Ht Readings from Last 1 Encounters:  12/06/14 5\' 3"  (1.6 m)   Physical exam: Exam: Gen: NAD, conversant, well nourised, obese, well groomed                     CV: RRR, no MRG. No Carotid Bruits. No peripheral edema, warm, nontender Eyes: Conjunctivae clear without exudates or hemorrhage  Neuro: Detailed Neurologic Exam  Speech:    Speech is normal; fluent and spontaneous with normal comprehension.  Cognition:    The patient is oriented to person, place, and time;     recent and remote memory intact;     language fluent;     normal attention, concentration,     fund of knowledge Cranial Nerves:    The pupils are equal, round, and reactive to light. The fundi are normal and spontaneous venous pulsations are present. Visual fields are full to finger confrontation. Extraocular movements are intact. Trigeminal sensation is intact and the muscles of mastication are normal. The face is symmetric. The palate elevates in the midline. Voice is normal. Shoulder shrug is normal. The tongue has normal motion without fasciculations.   Coordination:    Normal finger to nose and heel to shin. Normal rapid alternating movements.   Gait:    Heel-toe and tandem gait are normal.   Motor Observation:    No asymmetry, no atrophy, and no involuntary movements noted. Tone:    Normal muscle tone.    Posture:    Posture is normal. normal erect    Strength: left arm with giveway due to pain, otherwise strength is V/V in the upper and lower limbs.      Sensation:  +Tinel's sign at the left wrist Decreased pp in the whole hand and forearm in no dermatomal or nerve pattern.     Reflex Exam:  DTR's:    Deep tendon reflexes in the upper and lower extremities are normal bilaterally.   Toes:    The toes are downgoing bilaterally.   Clonus:    Clonus is absent.       Assessment/Plan:  43 year old female with left hand paresthesias. MRI of the brain unremarkable. MRI of the cervical spine  showed c5-6 mild bilat foraminal stenosis. Will perform EMG/NCS to evaluate for mononeuropathy vs polyneuropathy vs cervical radiculopathy. +Tinel's sign at the left wrist. Wear wrist brace to keep wrist straight.   Sarina Ill, MD  [facility] [address] [place], [place] [address]  Phone [phone] Fax [phone]

## 2014-12-13 ENCOUNTER — Ambulatory Visit: Payer: Medicaid Other | Admitting: Occupational Therapy

## 2014-12-19 ENCOUNTER — Ambulatory Visit (INDEPENDENT_AMBULATORY_CARE_PROVIDER_SITE_OTHER): Payer: Self-pay | Admitting: Neurology

## 2014-12-19 ENCOUNTER — Encounter: Payer: Self-pay | Admitting: Neurology

## 2014-12-19 DIAGNOSIS — R29898 Other symptoms and signs involving the musculoskeletal system: Secondary | ICD-10-CM

## 2014-12-19 DIAGNOSIS — IMO0001 Reserved for inherently not codable concepts without codable children: Secondary | ICD-10-CM

## 2014-12-19 DIAGNOSIS — Z0289 Encounter for other administrative examinations: Secondary | ICD-10-CM

## 2014-12-19 DIAGNOSIS — G5603 Carpal tunnel syndrome, bilateral upper limbs: Secondary | ICD-10-CM

## 2014-12-19 DIAGNOSIS — R209 Unspecified disturbances of skin sensation: Secondary | ICD-10-CM

## 2014-12-19 DIAGNOSIS — M79602 Pain in left arm: Secondary | ICD-10-CM

## 2014-12-19 NOTE — Progress Notes (Signed)
  [facility]    Provider:  Dr Jaynee Eagles Referring Provider: Iona Beard, MD Primary Care Physician:  Maggie Font, MD  History:  Whitney Benton is a 43 y.o. female here for evaluation of left hand pain. Symptoms started 6 weeks ago, she was just talking to people one day and she noticed it. The coldness and pain is in all 5 of the left fingers, the whole hand and radiates all the way up her arm. Right arm not affected. Her left arm is weak, hurts to be touched or grabbed. She has to sleep a certain position or it hurts. In the middle of the night it wakes her up and she tries to shake it out, has pins and needles. At its worst it is a 10/10 pain. It is worse when she uses it. No neck pain or radicular neck pain. No symptoms in any of the other extremities. MRI of the brain unremarkable. MRI of the cervical spine showed mild c5-6 mild bilat foraminal stenosis.  +Tinel's sign at the left wrist.   Summary:   Left median motor nerve showed delayed distal onset latency(5.8ms, N<4.4ms) and normal F Response Left 2nd-digit Median sensory nerve showed delayed distal peak latency (4.105ms, N<3.9)  Right median motor nerve showed delayed distal onset latency(4.27ms, N<4.34ms) and normal F Response Right 2nd-digit Median sensory nerve showed delayed distal peak latency (4.45ms, N<3.9)  The right median/ulnar (palm) comparison nerve showed prolonged distal peak latency (Median Palm, 2.9 ms, N<2.2), abnormal peak latency difference (Median Palm-Ulnar Palm, 1.0 ms, N<0.4) with a relative median delay.  The left median/ulnar (palm) comparison nerve showed prolonged distal peak latency (Median Palm, 2.9 ms, N<2.2), abnormal peak latency difference (Median Palm-Ulnar Palm, 0.8 ms, N<0.4) with a relative median delay.  Bilateral Ulnar ADM motor nerves were within normal limits with normal F response latencies.  Bilateral Ulnar 5th digit sensory nerves were within normal limits.   Bilateral radial  sensory nerves were within normal limits.  Needle evaluation of the following muscles were within normal limits: left Deltoid, left  Triceps, left Pronator teres, left Opponens Pollicis, left First Dorsal Interosseous. Patient could not activate FDI and Opponens Pollicis due to pain and emg needle study was prematurely terminated at patient's request.   Conclusion: There is electrophysiologic evidence for moderately-severe left> right  Carpal Tunnel Syndrome. No suggestion of polyneuropathy or cervical radiculopathy.     Sarina Ill, MD  [facility] [address] [place], [place] [address]  Phone [phone] Fax [phone]

## 2014-12-19 NOTE — Progress Notes (Signed)
  [facility]    Provider:  Dr Jaynee Eagles Referring Provider: Iona Beard, MD Primary Care Physician:  Maggie Font, MD  History:  Whitney Benton is a 43 y.o. female here for evaluation of left hand pain. Symptoms started 6 weeks ago, she was just talking to people one day and she noticed it. The coldness and pain is in all 5 of the left fingers, the whole hand and radiates all the way up her arm. Right arm not affected. Her left arm is weak, hurts to be touched or grabbed. She has to sleep a certain position or it hurts. In the middle of the night it wakes her up and she tries to shake it out, has pins and needles. At its worst it is a 10/10 pain. It is worse when she uses it. No neck pain or radicular neck pain. No symptoms in any of the other extremities. MRI of the brain unremarkable. MRI of the cervical spine showed mild c5-6 mild bilat foraminal stenosis.  +Tinel's sign at the left wrist.   Summary:   Left median motor nerve showed delayed distal onset latency(5.41ms, N<4.43ms) and normal F Response Left 2nd-digit Median sensory nerve showed delayed distal peak latency (4.51ms, N<3.9)  Right median motor nerve showed delayed distal onset latency(4.55ms, N<4.29ms) and normal F Response Right 2nd-digit Median sensory nerve showed delayed distal peak latency (4.60ms, N<3.9)  The right median/ulnar (palm) comparison nerve showed prolonged distal peak latency (Median Palm, 2.9 ms, N<2.2), abnormal peak latency difference (Median Palm-Ulnar Palm, 1.0 ms, N<0.4) with a relative median delay.  The left median/ulnar (palm) comparison nerve showed prolonged distal peak latency (Median Palm, 2.9 ms, N<2.2), abnormal peak latency difference (Median Palm-Ulnar Palm, 0.8 ms, N<0.4) with a relative median delay.  Bilateral Ulnar ADM motor nerves were within normal limits with normal F response latencies.  Bilateral Ulnar 5th digit sensory nerves were within normal limits.   Bilateral radial  sensory nerves were within normal limits.  Needle evaluation of the following muscles were within normal limits: left Deltoid, left  Triceps, left Pronator teres, left Opponens Pollicis, left First Dorsal Interosseous. Patient could not activate FDI and Opponens Pollicis due to pain and emg needle study was prematurely terminated at patient's request.   Conclusion: There is electrophysiologic evidence for moderately-severe left> right  Carpal Tunnel Syndrome. No suggestion of polyneuropathy or cervical radiculopathy.     Sarina Ill, MD  [facility] [address] [place], [place] [address]  Phone [phone] Fax [phone]

## 2014-12-20 ENCOUNTER — Telehealth: Payer: Self-pay | Admitting: Neurology

## 2014-12-20 ENCOUNTER — Ambulatory Visit: Payer: Medicaid Other | Admitting: Occupational Therapy

## 2014-12-20 DIAGNOSIS — M79602 Pain in left arm: Secondary | ICD-10-CM

## 2014-12-20 DIAGNOSIS — R278 Other lack of coordination: Secondary | ICD-10-CM

## 2014-12-20 DIAGNOSIS — R279 Unspecified lack of coordination: Principal | ICD-10-CM

## 2014-12-20 DIAGNOSIS — M6281 Muscle weakness (generalized): Secondary | ICD-10-CM

## 2014-12-20 DIAGNOSIS — R200 Anesthesia of skin: Secondary | ICD-10-CM

## 2014-12-20 NOTE — Telephone Encounter (Signed)
LM with female to have patient to call office. I called to see if she has a preference of location for the hand surgery referral as she will be paying out of pocket. Referral was originally sent to [facility], but with her only having family planning insurance she would be a self pay patient and they are not accepting self pay patients at this time.

## 2014-12-20 NOTE — Patient Instructions (Addendum)
Flexor Tendon Gliding (Active Hook Fist)   With fingers and knuckles straight, bend middle and tip joints. Do not bend large knuckles. Repeat _10-15___ times. Do _4-6___ sessions per day.  MP Flexion (Active)   With back of hand on table, bend large knuckles as far as they will go, keeping small joints straight. Repeat _10-15___ times. Do __4-6__ sessions per day.     Finger Flexion / Extension   With palm up, bend fingers of left hand toward palm, making a  fist. Straighten fingers, opening fist. Repeat sequence _10-15___ times per session. Do _4-6__ sessions per day. Hand Variation: Palm down   Copyright  VHI. All rights reserved.   Splint wear instructions: Wear wrist brace during activities that provoke pain. Remove splint for bathing. Wear splint to sleep.  Remove splint several times a day to perform exercises.

## 2014-12-20 NOTE — Therapy (Signed)
[facility] [address] [place], [place], [address] Phone: [phone]   Fax:  [phone]  Occupational Therapy Treatment  Patient Details  Name: Whitney Benton MRN: [id number] Date of Birth: 09/12/1971  Encounter Date: 12/20/2014      OT End of Session - 12/20/14 0846    Visit Number 2   Number of Visits 12   Date for OT Re-Evaluation 01/08/15   Authorization Type MCD approved 12 visits 12/2-1/17/16   Authorization - Visit Number 1   Authorization - Number of Visits 12   OT Start Time 0808   OT Stop Time 0846   OT Time Calculation (min) 38 min   Activity Tolerance Patient limited by pain   Behavior During Therapy Anxious      Past Medical History  Diagnosis Date  . Depression   . Anxiety     Past Surgical History  Procedure Laterality Date  . Cesarean section    . Vaginal hysterectomy  2006    Fibroids, menorrhagia, benign pathology    There were no vitals taken for this visit.  Visit Diagnosis:  Decreased coordination  Left arm numbness  Left arm pain  Generalized muscle weakness      Subjective Assessment - 12/20/14 1418    Currently in Pain? Yes   Pain Score 10-Worst pain ever   Pain Location Hand   Pain Orientation Left   Pain Descriptors / Indicators Tingling;Numbness   Pain Type Neuropathic pain   Pain Frequency Constant   Aggravating Factors  cold   Pain Relieving Factors unknown   Multiple Pain Sites No          Treatment: Fluidotherapy x 10 mins, 107*, due to pain and stiffness. No adverse reactions. Pt was instructed in in tendon gliding exercises, she performed 15-20 reps each.Pt required v.c. and encouragement for performance. HEP issued. Pt was issued a d-ring wrist brace and education in wear, care and precautions. Therapist initiated education regarding proper positioning.              OT Education - 12/20/14 0855    Education provided Yes   Education  Details tendon gliding exercises, proper positioning, splint wear schedule   Person(s) Educated Patient   Methods Explanation;Demonstration;Verbal cues   Comprehension Verbalized understanding;Verbal cues required;Returned demonstration          OT Short Term Goals - 11/23/14 1255    OT SHORT TERM GOAL #1   Title Pt will be Mod I HEP   Baseline Req vc's  Assess 12/14/14   Time 3   Period Weeks   Status New   OT SHORT TERM GOAL #2   Title Pt will reports decreased pain LUE during light functional use to 6/10 or less   Baseline 7-8/10 up to 10/10  Reassess 12/14/14   Time 3   Period Weeks   Status New   OT SHORT TERM GOAL #3   Title Pt will be Mod grooming    Baseline Pt reports increaesd time - min A currently (opening toothpaste depending on pain)   Time 3   Period Weeks   Status New           OT Long Term Goals - 11/23/14 1258    OT LONG TERM GOAL #1   Title Pt will be Mod I upgraded HEP LUE   Baseline --  Assess 01/04/15   Time 6   Period Weeks   Status New   OT LONG  TERM GOAL #2   Title Pt will repotrs Mod I light homemaking and ADL's using LUE as non-dominant hand   Baseline --  Assess 01/04/15   Time 6   Period Weeks   Status New   OT LONG TERM GOAL #3   Title Pt will report improved coordination as seen by improved 9 hole peg test score left by 5-10 seconds or more.   Baseline Assess 01/04/15  L=45.66, R = 24.66 seconds   Time 6   Period Weeks   Status New   OT LONG TERM GOAL #4   Title Pt will reports decreased pain LUE during functional activities to 3-4/10 or less   Baseline Assess 01/04/15  7-8 up to 10/10 LUE   Time 6   Period Weeks   Status New               Plan - 12/20/14 1420    Clinical Impression Statement Pt was limited by significant pain today.   Rehab Potential Fair   Clinical Impairments Affecting Rehab Potential Pain limiting functional use LUE; decreased coordination, generalized muscle weakness, ? nerve compression  LUE.   OT Frequency 2x / week   OT Duration 6 weeks   OT Treatment/Interventions Self-care/ADL training;Fluidtherapy;DME and/or AE instruction;Neuromuscular education;Therapeutic exercise;Manual Therapy;Therapeutic activities;Therapeutic exercises;Patient/family education;Splinting;Moist Heat;Ultrasound;Cryotherapy;Passive range of motion   Plan fluidotherapy, reinforce tendon gliding, light functional use LUE   OT Home Exercise Plan tendon gliding   Consulted and Agree with Plan of Care Patient        Problem List Patient Active Problem List   Diagnosis Date Noted  . Left arm weakness 12/06/2014  . Left arm pain 12/06/2014  . Paresthesias/numbness 12/06/2014  . Left hand weakness 11/14/2014  . Tobacco abuse 11/14/2014  . Hypokalemia 11/14/2014  . Depression   . Mixed incontinence 06/27/2014  . History of TVH in 2006 for fibroids and menorrhagia; benign pathology 09/08/2011    RINE,KATHRYN 12/20/2014, 2:28 PM Theone Murdoch, OTR/L [phone] Phone: [phone] 2:28 PM 12/20/2014 [facility] [address] [place], [place], [address] Phone: [phone]   Fax:  [phone]

## 2014-12-22 ENCOUNTER — Ambulatory Visit: Payer: Medicaid Other | Admitting: Occupational Therapy

## 2014-12-22 DIAGNOSIS — R200 Anesthesia of skin: Secondary | ICD-10-CM

## 2014-12-22 DIAGNOSIS — R279 Unspecified lack of coordination: Principal | ICD-10-CM

## 2014-12-22 DIAGNOSIS — M79602 Pain in left arm: Secondary | ICD-10-CM

## 2014-12-22 DIAGNOSIS — M6281 Muscle weakness (generalized): Secondary | ICD-10-CM

## 2014-12-22 DIAGNOSIS — R278 Other lack of coordination: Secondary | ICD-10-CM

## 2014-12-22 NOTE — Therapy (Signed)
[facility] [address] [place], [place], [address] Phone: [phone]   Fax:  [phone]  Occupational Therapy Treatment  Patient Details  Name: Whitney Benton MRN: [id number] Date of Birth: [birth year]  Encounter Date: 12/22/2014      OT End of Session - 12/22/14 1234    Visit Number 3   Number of Visits 12   Date for OT Re-Evaluation 01/08/15   Authorization Type MCD approved 12 visits 12/2-1/17/16   Authorization - Visit Number 2   Authorization - Number of Visits 12   OT Start Time 0933   OT Stop Time 1006   OT Time Calculation (min) 33 min   Activity Tolerance Patient limited by pain   Behavior During Therapy Anxious      Past Medical History  Diagnosis Date  . Depression   . Anxiety     Past Surgical History  Procedure Laterality Date  . Cesarean section    . Vaginal hysterectomy  2006    Fibroids, menorrhagia, benign pathology    There were no vitals taken for this visit.  Visit Diagnosis:  Decreased coordination  Left arm numbness  Left arm pain  Generalized muscle weakness      Subjective Assessment - 12/22/14 0954    Currently in Pain? Yes   Pain Score 8    Pain Location Arm   Pain Orientation Left   Pain Descriptors / Indicators Tingling;Numbness   Pain Type Neuropathic pain   Pain Onset More than a month ago   Pain Frequency Constant   Aggravating Factors  cold   Pain Relieving Factors medicine   Effect of Pain on Daily Activities limits daily activities   Multiple Pain Sites No     Treatment: Pt arrived without wrist brace. Pt reports wearing it most of the day, and at night. Education provided regarding minimizing carpal tunnel symptoms and positions to avoid Reviewed tendon gliding exercises( Roof, hook and composite fist). 20 reps each individually, then in sequence . Pt. required min. v.c. and demonstration. Tendon glides 1-2 from conservative management of CTS  handout performed 15 reps each. Median n. glides x 10 reps each, verbal cues and demonstration required. Pt reported last visi, that she saw the neurologist and she will probably need surgery for her carpal tunnel syndrome.                      OT Education - 12/22/14 [phone]    Education provided Yes   Education Details reviewed tendon gliding exercises and proper positioning to minimize symptoms   Person(s) Educated Patient   Methods Explanation;Demonstration;Verbal cues   Comprehension Verbalized understanding;Returned demonstration;Verbal cues required          OT Short Term Goals - 11/23/14 1255    OT SHORT TERM GOAL #1   Title Pt will be Mod I HEP   Baseline Req vc's  Assess 12/14/14   Time 3   Period Weeks   Status New   OT SHORT TERM GOAL #2   Title Pt will reports decreased pain LUE during light functional use to 6/10 or less   Baseline 7-8/10 up to 10/10  Reassess 12/14/14   Time 3   Period Weeks   Status New   OT SHORT TERM GOAL #3   Title Pt will be Mod grooming    Baseline Pt reports increaesd time - min A currently (opening toothpaste depending on pain)   Time 3  Period Weeks   Status New           OT Long Term Goals - 11/23/14 1258    OT LONG TERM GOAL #1   Title Pt will be Mod I upgraded HEP LUE   Baseline --  Assess 01/04/15   Time 6   Period Weeks   Status New   OT LONG TERM GOAL #2   Title Pt will repotrs Mod I light homemaking and ADL's using LUE as non-dominant hand   Baseline --  Assess 01/04/15   Time 6   Period Weeks   Status New   OT LONG TERM GOAL #3   Title Pt will report improved coordination as seen by improved 9 hole peg test score left by 5-10 seconds or more.   Baseline Assess 01/04/15  L=45.66, R = 24.66 seconds   Time 6   Period Weeks   Status New   OT LONG TERM GOAL #4   Title Pt will reports decreased pain LUE during functional activities to 3-4/10 or less   Baseline Assess 01/04/15  7-8 up to 10/10  LUE   Time 6   Period Weeks   Status New               Plan - 12/22/14 1003    Clinical Impression Statement Pt continues to be limited by significant pain. Therapist discussed that we will give therapy a trial for several weeks and if pain does not improve we will likely d/c.   Rehab Potential Fair   Clinical Impairments Affecting Rehab Potential Pain limiting functional use LUE; decreased coordination, generalized muscle weakness, ? nerve compression LUE.   OT Duration 6 weeks   OT Treatment/Interventions Self-care/ADL training;Fluidtherapy;DME and/or AE instruction;Neuromuscular education;Therapeutic exercise;Manual Therapy;Therapeutic activities;Therapeutic exercises;Patient/family education;Splinting;Moist Heat;Ultrasound;Cryotherapy;Passive range of motion   Plan fluidotherapy, tenson gliding   OT Home Exercise Plan tendon gliding   Consulted and Agree with Plan of Care Patient        Problem List Patient Active Problem List   Diagnosis Date Noted  . Left arm weakness 12/06/2014  . Left arm pain 12/06/2014  . Paresthesias/numbness 12/06/2014  . Left hand weakness 11/14/2014  . Tobacco abuse 11/14/2014  . Hypokalemia 11/14/2014  . Depression   . Mixed incontinence 06/27/2014  . History of TVH in 2006 for fibroids and menorrhagia; benign pathology 09/08/2011    RINE,KATHRYN 12/22/2014, 12:43 PM Theone Murdoch, OTR/L [phone] Phone: [phone] 12:43 PM 12/31/2015[facility] [address] [place], [place], [address] Phone: [phone]   Fax:  [phone]

## 2014-12-26 ENCOUNTER — Ambulatory Visit: Payer: Self-pay | Attending: Internal Medicine | Admitting: Occupational Therapy

## 2014-12-26 ENCOUNTER — Encounter: Payer: Self-pay | Admitting: Occupational Therapy

## 2014-12-26 DIAGNOSIS — M79602 Pain in left arm: Secondary | ICD-10-CM

## 2014-12-26 DIAGNOSIS — R278 Other lack of coordination: Secondary | ICD-10-CM

## 2014-12-26 DIAGNOSIS — R208 Other disturbances of skin sensation: Secondary | ICD-10-CM | POA: Insufficient documentation

## 2014-12-26 DIAGNOSIS — R279 Unspecified lack of coordination: Secondary | ICD-10-CM | POA: Insufficient documentation

## 2014-12-26 DIAGNOSIS — M6281 Muscle weakness (generalized): Secondary | ICD-10-CM

## 2014-12-26 DIAGNOSIS — R200 Anesthesia of skin: Secondary | ICD-10-CM

## 2014-12-26 NOTE — Therapy (Signed)
[facility] [address] [place], [place], [address] Phone: [phone]   Fax:  [phone]  Occupational Therapy Treatment  Patient Details  Name: Whitney Benton MRN: [id number] Date of Birth: 1971/10/02  Encounter Date: 12/26/2014      OT End of Session - 12/26/14 0800    Visit Number 4   Number of Visits 12   Date for OT Re-Evaluation 01/08/15   Authorization Type MCD approved 12 visits 12/2-1/17/16   Authorization - Visit Number 3   Authorization - Number of Visits 12   OT Start Time 0751   OT Stop Time 0830   OT Time Calculation (min) 39 min   Activity Tolerance Patient limited by pain      Past Medical History  Diagnosis Date  . Depression   . Anxiety     Past Surgical History  Procedure Laterality Date  . Cesarean section    . Vaginal hysterectomy  2006    Fibroids, menorrhagia, benign pathology    There were no vitals taken for this visit.  Visit Diagnosis:  Decreased coordination  Left arm numbness  Left arm pain  Generalized muscle weakness      Subjective Assessment - 12/26/14 0758    Symptoms "not good"   Pain Score 8    Pain Location Hand   Pain Orientation Left   Pain Descriptors / Indicators Constant   Aggravating Factors  cold   Pain Relieving Factors medicine                 OT Treatments/Exercises (OP) - 12/26/14 0001    Exercises   Exercises Wrist;Hand   Wrist Exercises   Wrist Extension PROM;Left;10 reps;AROM  Self PROM 10-15sec hold, AROM wrist flex/ext with elbow ext   Hand Exercises   MCPJ Flexion AROM;15 reps;Left  isolated MP flex/ext   Digit Composite ABduction AROM;15 reps  gross   Thumb Opposition to base of 5th digit x15   Other Hand Exercises Tendon glidesx20   Modalities   Modalities Fluidotherapy   LUE Fluidotherapy   Number Minutes Fluidotherapy 12 Minutes   LUE Fluidotherapy Location Hand;Wrist   Comments no adverse reactions for pain                 OT Education - 12/26/14 0821    Education provided Yes   Education Details wrist flex/ext AROM   Person(s) Educated Patient   Methods Explanation;Demonstration;Verbal cues;Handout   Comprehension Verbalized understanding;Returned demonstration          OT Short Term Goals - 12/26/14 0801    OT SHORT TERM GOAL #1   Title Pt will be Mod I HEP   Baseline Req vc's   Time --  check 12/30/14   Status On-going  extend as pt only seen for 4 visits   OT SHORT TERM GOAL #2   Title Pt will reports decreased pain LUE during light functional use to 6/10 or less   Baseline 7-8/10 up to 10/10   Time --  check 12/30/14   Status On-going  extend as pt only seen for 4 visits   OT SHORT TERM GOAL #3   Title Pt will be Mod grooming    Time --  check 12/30/14   Status On-going  extend as pt only seen for 4 visits           OT Long Term Goals - 11/23/14 1258    OT LONG TERM GOAL #1   Title  Pt will be Mod I upgraded HEP LUE   Baseline --  Assess 01/04/15   Time 6   Period Weeks   Status New   OT LONG TERM GOAL #2   Title Pt will repotrs Mod I light homemaking and ADL's using LUE as non-dominant hand   Baseline --  Assess 01/04/15   Time 6   Period Weeks   Status New   OT LONG TERM GOAL #3   Title Pt will report improved coordination as seen by improved 9 hole peg test score left by 5-10 seconds or more.   Baseline Assess 01/04/15  L=45.66, R = 24.66 seconds   Time 6   Period Weeks   Status New   OT LONG TERM GOAL #4   Title Pt will reports decreased pain LUE during functional activities to 3-4/10 or less   Baseline Assess 01/04/15  7-8 up to 10/10 LUE   Time 6   Period Weeks   Status New               Plan - 12/26/14 0805    Clinical Impression Statement Extend STGs to 12/30/14 as pt only seen 4 visits.  Pt continues to be limited by significant pain with no improvement reported.      Plan fludio, review wrist AROM HEP, progress as tolerated         Problem List Patient Active Problem List   Diagnosis Date Noted  . Left arm weakness 12/06/2014  . Left arm pain 12/06/2014  . Paresthesias/numbness 12/06/2014  . Left hand weakness 11/14/2014  . Tobacco abuse 11/14/2014  . Hypokalemia 11/14/2014  . Depression   . Mixed incontinence 06/27/2014  . History of TVH in 2006 for fibroids and menorrhagia; benign pathology 09/08/2011    [facility], OTR/L 12/26/2014, 9:02 AM  [facility] [address] [place], [place], [address] Phone: [phone]   Fax:  [phone]

## 2014-12-26 NOTE — Patient Instructions (Signed)
AROM: Wrist Extension   .  With  palm down, bend wrist up. Repeat __15__ times per set.  Do __4__ sessions per day.    AROM: Wrist Flexion   With palm up, bend wrist up. Repeat __15__ times per set.  Do _4__ sessions per day.

## 2014-12-28 ENCOUNTER — Encounter: Payer: Self-pay | Admitting: Occupational Therapy

## 2014-12-28 ENCOUNTER — Ambulatory Visit: Payer: Self-pay | Admitting: Occupational Therapy

## 2014-12-28 DIAGNOSIS — R279 Unspecified lack of coordination: Principal | ICD-10-CM

## 2014-12-28 DIAGNOSIS — M79602 Pain in left arm: Secondary | ICD-10-CM

## 2014-12-28 DIAGNOSIS — R278 Other lack of coordination: Secondary | ICD-10-CM

## 2014-12-28 DIAGNOSIS — R200 Anesthesia of skin: Secondary | ICD-10-CM

## 2014-12-28 DIAGNOSIS — M6281 Muscle weakness (generalized): Secondary | ICD-10-CM

## 2014-12-28 NOTE — Therapy (Signed)
[facility] [address] [place], [place], [address] Phone: [phone]   Fax:  [phone]  Occupational Therapy Treatment  Patient Details  Name: Whitney Benton MRN: [id number] Date of Birth: 12-29-1970  Encounter Date: 12/28/2014      OT End of Session - 12/28/14 1012    Visit Number 5   Number of Visits 12   Date for OT Re-Evaluation 01/08/15   Authorization Type MCD approved 12 visits 12/7-1/17/16   Authorization Time Period week 2/6   Authorization - Visit Number 4   Authorization - Number of Visits 12   OT Start Time [phone]   OT Stop Time 1010   OT Time Calculation (min) 45 min   Activity Tolerance Patient limited by pain   Behavior During Therapy Anxious      Past Medical History  Diagnosis Date  . Depression   . Anxiety     Past Surgical History  Procedure Laterality Date  . Cesarean section    . Vaginal hysterectomy  2006    Fibroids, menorrhagia, benign pathology    There were no vitals taken for this visit.  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 Zipporah Plants)   Currently in Pain? Yes   Pain Score 8    Pain Location Hand   Pain Orientation Left   Pain Descriptors / Indicators Constant;Tingling;Numbness;Throbbing;Radiating   Pain Type Neuropathic pain   Pain Onset More than a month ago   Pain Frequency Constant   Aggravating Factors  cold, too hot of temperature   Pain Relieving Factors medicine   Effect of Pain on Daily Activities limit daily activities   Multiple Pain Sites No                 OT Treatments/Exercises (OP) - 12/28/14 0939    ADLs   ADL Comments Discussed modifications and adaptations to tasks as well as proper positioning of wrist to decreased pain   ADL Education Given Yes    Compensatory Strategies larger handles, adapted ways to hold items/objects, modifications to wringing out washclothes, etc   General Comments Pt also issued CTS conservative management program and reviewed   Wrist Exercises   Other wrist exercises Reviewed gentle wrist flexion/extension AROM. Pt return demo   Other wrist exercises Issued and reviewed lower median n. gliding ex's (AROM)   Hand Exercises   Other Hand Exercises Reviewed tendon gliding ex's and ex's from CTS handout   LUE Fluidotherapy   Number Minutes Fluidotherapy 12 Minutes   LUE Fluidotherapy Location Hand;Wrist   Comments to decrease pain, at decreased temperature.                 OT Education - 12/28/14 0955    Education Details CTS conservative therapy program (exercises #1, 2, 5, and 7), and wrist median nerve glide HEP  Pt instructed to stop if increase in pain - pt agreed   Person(s) Educated Patient   Methods Explanation;Demonstration;Handout   Comprehension Verbalized understanding;Returned demonstration          OT Short Term Goals - 12/28/14 0940    OT SHORT TERM GOAL #1   Title Pt will be Mod I HEP   Status Achieved   OT SHORT TERM GOAL #2   Title Pt will reports decreased  pain LUE during light functional use to 6/10 or less   Status Not Met  8/10 pain   OT SHORT TERM GOAL #3   Title Pt will be Mod grooming    Status Achieved  met w/ Rt hand only            OT Long Term Goals - 11/23/14 1258    OT LONG TERM GOAL #1   Title Pt will be Mod I upgraded HEP LUE   Baseline --  Assess 01/04/15   Time 6   Period Weeks   Status New   OT LONG TERM GOAL #2   Title Pt will repotrs Mod I light homemaking and ADL's using LUE as non-dominant hand   Baseline --  Assess 01/04/15   Time 6   Period Weeks   Status New   OT LONG TERM GOAL #3   Title Pt will report improved coordination as seen by improved 9 hole peg test score left by 5-10 seconds or more.   Baseline Assess 01/04/15   L=45.66, R = 24.66 seconds   Time 6   Period Weeks   Status New   OT LONG TERM GOAL #4   Title Pt will reports decreased pain LUE during functional activities to 3-4/10 or less   Baseline Assess 01/04/15  7-8 up to 10/10 LUE   Time 6   Period Weeks   Status New               Plan - 12/28/14 1013    Clinical Impression Statement Pt met 2/3 STG's. STG #2 not met due to pain still 8/10.    Plan fluido, continue A/ROM. If no improvement in pain - will d/c by end of next week secondary to possible surgery. Pt in agreement w/ d/c if no decrease in pain   Consulted and Agree with Plan of Care Patient        Problem List Patient Active Problem List   Diagnosis Date Noted  . Left arm weakness 12/06/2014  . Left arm pain 12/06/2014  . Paresthesias/numbness 12/06/2014  . Left hand weakness 11/14/2014  . Tobacco abuse 11/14/2014  . Hypokalemia 11/14/2014  . Depression   . Mixed incontinence 06/27/2014  . History of TVH in 2006 for fibroids and menorrhagia; benign pathology 09/08/2011    Carey Bullocks, OTR/L 12/28/2014, 10:17 AM  [facility] [address] [place], [place], [address] Phone: [phone]   Fax:  [phone]

## 2015-01-04 ENCOUNTER — Ambulatory Visit: Payer: Self-pay | Admitting: Occupational Therapy

## 2015-01-06 ENCOUNTER — Ambulatory Visit: Payer: Self-pay | Admitting: Occupational Therapy

## 2015-01-11 ENCOUNTER — Ambulatory Visit: Payer: Self-pay | Admitting: Occupational Therapy

## 2015-01-13 ENCOUNTER — Ambulatory Visit: Payer: Self-pay | Admitting: Occupational Therapy

## 2015-01-18 ENCOUNTER — Encounter: Payer: Medicaid Other | Admitting: Occupational Therapy

## 2015-01-20 ENCOUNTER — Encounter: Payer: Medicaid Other | Admitting: Occupational Therapy

## 2015-01-25 ENCOUNTER — Ambulatory Visit: Payer: Self-pay | Admitting: Occupational Therapy

## 2015-01-27 ENCOUNTER — Encounter: Payer: Medicaid Other | Admitting: Occupational Therapy

## 2015-02-07 ENCOUNTER — Encounter: Payer: Self-pay | Admitting: Occupational Therapy

## 2015-02-07 NOTE — Therapy (Signed)
[facility] [address] [place], [place], [address] Phone: [phone]   Fax:  [phone]  Patient Details  Name: Whitney Benton MRN: [id number] Date of Birth: June 13, 1971 Referring Provider:  Dr. Brett Fairy       OT Short Term Goals - 12/28/14 0940    OT SHORT TERM GOAL #1   Title Pt will be Mod I HEP   Status Achieved   OT SHORT TERM GOAL #2   Title Pt will reports decreased pain LUE during light functional use to 6/10 or less   Status Not Met  8/10 pain   OT SHORT TERM GOAL #3   Title Pt will be Mod grooming    Status Achieved  met w/ Rt hand only          OT Long Term Goals - 11/23/14 1258    OT LONG TERM GOAL #1   Title Pt will be Mod I upgraded HEP LUE   Baseline --  Assess 01/04/15   Time 6   Period Weeks   Status Not met   OT LONG TERM GOAL #2   Title Pt will repotrs Mod I light homemaking and ADL's using LUE as non-dominant hand   Baseline --    Time 6   Period Weeks   Status Not met   OT LONG TERM GOAL #3   Title Pt will report improved coordination as seen by improved 9 hole peg test score left by 5-10 seconds or more.   Baseline    Time 6   Period Weeks   Status Not met, not tested   OT LONG TERM GOAL #4   Title Pt will reports decreased pain LUE during functional activities to 3-4/10 or less   Baseline  Pain 8/10   Time 6   Period Weeks   Status Not met     OCCUPATIONAL THERAPY DISCHARGE SUMMARY  Visits from Start of Care: 5  Current functional level related to goals / functional outcomes: Pt did not meet long term goals due to significant LUE pain, 8/10. Pt anticipates she will need to undergo surgery for CTS.   Remaining deficits:pain, decreased strength, decreased coordination.   Education / Equipment: Pt was educated regarding adapted strategies/ techniques for ADLs, positions to avoid to minimize pain and HEP. Pt verbalized understanding of education. Plan: Patient agrees  to discharge.  Patient goals were not met. Patient is being discharged due to lack of progress.  ?????            Encounter Date: 02/07/2015 Theone Murdoch, OTR/L [phone] Phone: [phone] 6:10 PM 02/07/2015  RINE,KATHRYN 02/07/2015, 6:10 PM  [facility] [address] [place], [place], [address] Phone: [phone]   Fax:  [phone]

## 2015-03-24 ENCOUNTER — Encounter (HOSPITAL_COMMUNITY): Payer: Self-pay | Admitting: Emergency Medicine

## 2015-03-24 ENCOUNTER — Inpatient Hospital Stay (HOSPITAL_COMMUNITY)
Admission: AD | Admit: 2015-03-24 | Discharge: 2015-03-24 | Disposition: A | Discharge status: Home / Self Care | Payer: Self-pay | Source: Ambulatory Visit | Attending: Obstetrics & Gynecology | Admitting: Obstetrics & Gynecology

## 2015-03-24 DIAGNOSIS — A5901 Trichomonal vulvovaginitis: Secondary | ICD-10-CM | POA: Insufficient documentation

## 2015-03-24 DIAGNOSIS — F17210 Nicotine dependence, cigarettes, uncomplicated: Secondary | ICD-10-CM | POA: Insufficient documentation

## 2015-03-24 DIAGNOSIS — Z90710 Acquired absence of both cervix and uterus: Secondary | ICD-10-CM | POA: Insufficient documentation

## 2015-03-24 HISTORY — DX: Trichomoniasis, unspecified: A59.9

## 2015-03-24 LAB — URINE MICROSCOPIC-ADD ON

## 2015-03-24 LAB — WET PREP, GENITAL
Clue Cells Wet Prep HPF POC: NONE SEEN
YEAST WET PREP: NONE SEEN

## 2015-03-24 LAB — URINALYSIS, ROUTINE W REFLEX MICROSCOPIC
Bilirubin Urine: NEGATIVE
GLUCOSE, UA: NEGATIVE mg/dL
HGB URINE DIPSTICK: NEGATIVE
Ketones, ur: NEGATIVE mg/dL
Nitrite: POSITIVE — AB
PH: 6 (ref 5.0–8.0)
Protein, ur: NEGATIVE mg/dL
Urobilinogen, UA: 1 mg/dL (ref 0.0–1.0)

## 2015-03-24 MED ORDER — IBUPROFEN 600 MG PO TABS: 600.0000 mg | ORAL_TABLET | Freq: Four times a day (QID) | ORAL | Status: DC | PRN

## 2015-03-24 MED ORDER — METRONIDAZOLE 500 MG PO TABS
2000.0000 mg | ORAL_TABLET | Freq: Once | ORAL | Status: AC
  Administered 2015-03-24: 2000 mg via ORAL
  Filled 2015-03-24: qty 4

## 2015-03-24 NOTE — Discharge Instructions (Signed)
Trichomoniasis Trichomoniasis is an infection caused by an organism called Trichomonas. The infection can affect both women and men. In women, the outer female genitalia and the vagina are affected. In men, the penis is mainly affected, but the prostate and other reproductive organs can also be involved. Trichomoniasis is a sexually transmitted infection (STI) and is most often passed to another person through sexual contact.  RISK FACTORS  Having unprotected sexual intercourse.  Having sexual intercourse with an infected partner. SIGNS AND SYMPTOMS  Symptoms of trichomoniasis in women include:  Abnormal gray-green frothy vaginal discharge.  Itching and irritation of the vagina.  Itching and irritation of the area outside the vagina. Symptoms of trichomoniasis in men include:   Penile discharge with or without pain.  Pain during urination. This results from inflammation of the urethra. DIAGNOSIS  Trichomoniasis may be found during a Pap test or physical exam. Your health care provider may use one of the following methods to help diagnose this infection:  Examining vaginal discharge under a microscope. For men, urethral discharge would be examined.  Testing the pH of the vagina with a test tape.  Using a vaginal swab test that checks for the Trichomonas organism. A test is available that provides results within a few minutes.  Doing a culture test for the organism. This is not usually needed. TREATMENT   You may be given medicine to fight the infection. Women should inform their health care provider if they could be or are pregnant. Some medicines used to treat the infection should not be taken during pregnancy.  Your health care provider may recommend over-the-counter medicines or creams to decrease itching or irritation.  Your sexual partner will need to be treated if infected. HOME CARE INSTRUCTIONS   Take medicines only as directed by your health care provider.  Take  over-the-counter medicine for itching or irritation as directed by your health care provider.  Do not have sexual intercourse while you have the infection.  Women should not douche or wear tampons while they have the infection.  Discuss your infection with your partner. Your partner may have gotten the infection from you, or you may have gotten it from your partner.  Have your sex partner get examined and treated if necessary.  Practice safe, informed, and protected sex.  See your health care provider for other STI testing. SEEK MEDICAL CARE IF:   You still have symptoms after you finish your medicine.  You develop abdominal pain.  You have pain when you urinate.  You have bleeding after sexual intercourse.  You develop a rash.  Your medicine makes you sick or makes you throw up (vomit). MAKE SURE YOU:  Understand these instructions.  Will watch your condition.  Will get help right away if you are not doing well or get worse. Document Released: 06/04/2001 Document Revised: 04/25/2014 Document Reviewed: 09/20/2013 [facility] Patient Information 2015 [place], [place]. This information is not intended to replace advice given to you by your health care provider. Make sure you discuss any questions you have with your health care provider.  Sexually Transmitted Disease A sexually transmitted disease (STD) is a disease or infection often passed to another person during sex. However, STDs can be passed through nonsexual ways. An STD can be passed through:  Spit (saliva).  Semen.  Blood.  Mucus from the vagina.  Pee (urine). HOW CAN I LESSEN MY CHANCES OF GETTING AN STD?  Use:  Latex condoms.  Water-soluble lubricants with condoms. Do not use petroleum  jelly or oils.  Dental dams. These are small pieces of latex that are used as a barrier during oral sex.  Avoid having more than one sex partner.  Do not have sex with someone who has other sex partners.  Do not have  sex with anyone you do not know or who is at high risk for an STD.  Avoid risky sex that can break your skin.  Do not have sex if you have open sores on your mouth or skin.  Avoid drinking too much alcohol or taking illegal drugs. Alcohol and drugs can affect your good judgment.  Avoid oral and anal sex acts.  Get shots (vaccines) for HPV and hepatitis.  If you are at risk of being infected with HIV, it is advised that you take a certain medicine daily to prevent HIV infection. This is called pre-exposure prophylaxis (PrEP). You may be at risk if:  You are a man who has sex with other men (MSM).  You are attracted to the opposite sex (heterosexual) and are having sex with more than one partner.  You take drugs with a needle.  You have sex with someone who has HIV.  Talk with your doctor about if you are at high risk of being infected with HIV. If you begin to take PrEP, get tested for HIV first. Get tested every 3 months for as long as you are taking PrEP. WHAT SHOULD I DO IF I THINK I HAVE AN STD?  See your doctor.  Tell your sex partner(s) that you have an STD. They should be tested and treated.  Do not have sex until your doctor says it is okay. WHEN SHOULD I GET HELP? Get help right away if:  You have bad belly (abdominal) pain.  You are a man and have puffiness (swelling) or pain in your testicles.  You are a woman and have puffiness in your vagina. Document Released: 01/16/2005 Document Revised: 12/14/2013 Document Reviewed: 06/04/2013 [facility] Patient Information 2015 [place], [place]. This information is not intended to replace advice given to you by your health care provider. Make sure you discuss any questions you have with your health care provider.

## 2015-03-24 NOTE — MAU Provider Note (Signed)
Chief Complaint: right sided pain    First Provider Initiated Contact with Patient 03/24/15 1519     SUBJECTIVE HPI: Whitney Benton is a 44 y.o. [id number] who presents to [facility] reporting right sided pelvic pain for the past three days. She had a hysterectomy in 2006 for uterine fibroids but still has intact ovaries. She localized her pain to the R pelvic area and noted that it will sometimes radiate across the pelvis. She described the pain as deep and constant, and a 10/10 in intensity. Nothing has seemed to help relieve the pain, and moving tends to provoke it further. She noted that she has also been feeling poorly since Monday (3/28), endorsing a fever, cough, backache, ear ache, and N/V. She denied any dizziness but noted that she has felt unbalanced during this time. She also noted that her whole household had been sick recently. When asked about her sexual history, she stated that she has one sexual partner and that she is currently sexually active with him. She denied ever having any STIs in the past as well.    Past Medical History  Diagnosis Date  . Depression   . Anxiety    OB History  Gravida Para Term Preterm AB SAB TAB Ectopic Multiple Living  5 3 3  2 2  0 0 0 3    # Outcome Date GA Lbr Len/2nd Weight Sex Delivery Anes PTL Lv  5 Term 06/1995 [redacted]w[redacted]d  2.438 kg (5 lb 6 oz) M CS-LTranv EPI       Comments: WHOGrpt C/S  4 Term 06/1994 [redacted]w[redacted]d  2.863 kg (6 lb 5 oz) M CS-LTranv EPI       Comments: [facility] rpt C/S, had dehydration  3 SAB 1994             Comments: D/C , twins  2 Term 08/1991 [redacted]w[redacted]d  3.799 kg (8 lb 6 oz) M CS-LTranv EPI       Comments: whog- C/S preeclampsia  1 SAB 1992             Comments: no d/C     Past Surgical History  Procedure Laterality Date  . Cesarean section    . Vaginal hysterectomy  2006    Fibroids, menorrhagia, benign pathology   History   Social History  . Marital Status: Single    Spouse Name: N/A  . Number of Children: 3  . Years  of Education: 14   Occupational History  . Not on file.   Social History Main Topics  . Smoking status: Current Every Day Smoker -- 0.25 packs/day for 8 years    Types: Cigarettes  . Smokeless tobacco: Never Used  . Alcohol Use: Yes     Comment: occasional  . Drug Use: No  . Sexual Activity: Yes    Birth Control/ Protection: Surgical   Other Topics Concern  . Not on file   Social History Narrative   Patient lives at home with mother and father    Patient has 3 children    Patient is single   Patient has 14 years of education    Patient is right handed    No current facility-administered medications on file prior to encounter.   Current Outpatient Prescriptions on File Prior to Encounter  Medication Sig Dispense Refill  . ibuprofen (ADVIL,MOTRIN) 800 MG tablet Take 800 mg by mouth every 8 (eight) hours as needed for moderate pain.    Marland Kitchen sertraline (ZOLOFT) 50 MG tablet Take 50  mg by mouth daily.    . cyclobenzaprine (FLEXERIL) 10 MG tablet Take 1 tablet (10 mg total) by mouth 3 (three) times daily as needed for muscle spasms. 30 tablet 0   Allergies  Allergen Reactions  . Aspirin Hives    ROS Pertinent ROS in HPI   OBJECTIVE Blood pressure 125/75, pulse 75, temperature 98.9 F (37.2 C), resp. rate 18, height 5\' 3"  (1.6 m), weight 81.194 kg (179 lb). GENERAL: Well-developed, well-nourished female in no acute distress.  HEART: normal rate, RRR, no mrg, normal S1/S2 RESP: normal effort, CTAB GI: Abdomen soft, tender on R side MS: Nontender, no edema noted NEURO: Alert and oriented SPECULUM EXAM: NEFG, physiologic discharge, no blood noted, cervix absent (post hysterectomy) BIMANUAL: no adnexal tenderness or masses  LAB RESULTS Results for orders placed or performed during the hospital encounter of 03/24/15 (from the past 24 hour(s))  Urinalysis, Routine w reflex microscopic     Status: Abnormal   Collection Time: 03/24/15  1:45 PM  Result Value Ref Range   Color,  Urine YELLOW YELLOW   APPearance HAZY (A) CLEAR   Specific Gravity, Urine >1.030 (H) 1.005 - 1.030   pH 6.0 5.0 - 8.0   Glucose, UA NEGATIVE NEGATIVE mg/dL   Hgb urine dipstick NEGATIVE NEGATIVE   Bilirubin Urine NEGATIVE NEGATIVE   Ketones, ur NEGATIVE NEGATIVE mg/dL   Protein, ur NEGATIVE NEGATIVE mg/dL   Urobilinogen, UA 1.0 0.0 - 1.0 mg/dL   Nitrite POSITIVE (A) NEGATIVE   Leukocytes, UA TRACE (A) NEGATIVE  Urine microscopic-add on     Status: Abnormal   Collection Time: 03/24/15  1:45 PM  Result Value Ref Range   Squamous Epithelial / LPF FEW (A) RARE   WBC, UA 11-20 <3 WBC/hpf   Bacteria, UA MANY (A) RARE   Urine-Other TRICHOMONAS PRESENT     IMAGING No results found.  MAU COURSE U/A w/ microscopic Pelvic exam GC and chlamydia DNA probe obtained and sent to lab   ASSESSMENT 1. Trichomonal vaginitis     PLAN Discharge home in stable condition. Metronidazole for trichomonas infection, other labs pending.  Increase fluids Patient may return to MAU as needed or if her condition were to change or worsen     Medication List    ASK your doctor about these medications        cyclobenzaprine 10 MG tablet  Commonly known as:  FLEXERIL  Take 1 tablet (10 mg total) by mouth 3 (three) times daily as needed for muscle spasms.     ibuprofen 800 MG tablet  Commonly known as:  ADVIL,MOTRIN  Take 800 mg by mouth every 8 (eight) hours as needed for moderate pain.     sertraline 50 MG tablet  Commonly known as:  ZOLOFT  Take 50 mg by mouth daily.         [employer] med student, Sandria Manly, was also present for patient evaluation.

## 2015-03-24 NOTE — MAU Note (Signed)
Pt presents to MAU with complaints of pain in her right side for three days. Denies any vaginal bleeding, hysterectomy in 2006

## 2015-03-27 LAB — URINE CULTURE: Colony Count: >=100000

## 2015-03-27 LAB — GC/CHLAMYDIA PROBE AMP (~~LOC~~) NOT AT ARMC
CHLAMYDIA, DNA PROBE: NEGATIVE
Neisseria Gonorrhea: NEGATIVE

## 2015-03-29 ENCOUNTER — Other Ambulatory Visit (HOSPITAL_COMMUNITY): Payer: Self-pay | Admitting: Advanced Practice Midwife

## 2015-03-29 MED ORDER — SULFAMETHOXAZOLE-TRIMETHOPRIM 800-160 MG PO TABS: 1.0000 | ORAL_TABLET | Freq: Two times a day (BID) | ORAL | Status: DC

## 2015-03-29 NOTE — Progress Notes (Signed)
Septra DS x 7 days sent in.for UTI Attempted to call patient to notify, but no answer.  Left message to call us back.

## 2015-05-30 ENCOUNTER — Encounter (HOSPITAL_COMMUNITY): Payer: Self-pay | Admitting: Emergency Medicine

## 2015-05-30 ENCOUNTER — Emergency Department (HOSPITAL_COMMUNITY)
Admission: EM | Admit: 2015-05-30 | Discharge: 2015-05-31 | Disposition: A | Discharge status: Home / Self Care | Payer: No Typology Code available for payment source | Attending: Emergency Medicine | Admitting: Emergency Medicine

## 2015-05-30 DIAGNOSIS — S3992XA Unspecified injury of lower back, initial encounter: Secondary | ICD-10-CM | POA: Diagnosis not present

## 2015-05-30 DIAGNOSIS — F329 Major depressive disorder, single episode, unspecified: Secondary | ICD-10-CM | POA: Diagnosis not present

## 2015-05-30 DIAGNOSIS — Z8619 Personal history of other infectious and parasitic diseases: Secondary | ICD-10-CM | POA: Insufficient documentation

## 2015-05-30 DIAGNOSIS — Z791 Long term (current) use of non-steroidal anti-inflammatories (NSAID): Secondary | ICD-10-CM | POA: Diagnosis not present

## 2015-05-30 DIAGNOSIS — Z79899 Other long term (current) drug therapy: Secondary | ICD-10-CM | POA: Diagnosis not present

## 2015-05-30 DIAGNOSIS — M545 Low back pain, unspecified: Secondary | ICD-10-CM

## 2015-05-30 DIAGNOSIS — Y92410 Unspecified street and highway as the place of occurrence of the external cause: Secondary | ICD-10-CM | POA: Insufficient documentation

## 2015-05-30 DIAGNOSIS — Z720 Tobacco use: Secondary | ICD-10-CM | POA: Insufficient documentation

## 2015-05-30 DIAGNOSIS — S161XXA Strain of muscle, fascia and tendon at neck level, initial encounter: Secondary | ICD-10-CM | POA: Diagnosis not present

## 2015-05-30 DIAGNOSIS — Y9389 Activity, other specified: Secondary | ICD-10-CM | POA: Diagnosis not present

## 2015-05-30 DIAGNOSIS — F419 Anxiety disorder, unspecified: Secondary | ICD-10-CM | POA: Insufficient documentation

## 2015-05-30 DIAGNOSIS — Y998 Other external cause status: Secondary | ICD-10-CM | POA: Insufficient documentation

## 2015-05-30 DIAGNOSIS — S0990XA Unspecified injury of head, initial encounter: Secondary | ICD-10-CM | POA: Diagnosis not present
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
## 2015-05-31 ENCOUNTER — Emergency Department (HOSPITAL_COMMUNITY): Payer: No Typology Code available for payment source

## 2015-06-01 ENCOUNTER — Ambulatory Visit: Payer: Medicaid Other | Admitting: Obstetrics & Gynecology

## 2015-06-29 ENCOUNTER — Encounter: Payer: Self-pay | Admitting: Family Medicine

## 2015-06-29 ENCOUNTER — Ambulatory Visit (INDEPENDENT_AMBULATORY_CARE_PROVIDER_SITE_OTHER): Payer: Self-pay | Admitting: Family Medicine

## 2015-06-29 VITALS — BP 128/82 | HR 74 | Temp 98.3°F | Ht 63.0 in | Wt 172.6 lb

## 2015-06-29 DIAGNOSIS — Z01419 Encounter for gynecological examination (general) (routine) without abnormal findings: Secondary | ICD-10-CM

## 2015-06-29 MED ORDER — NYSTATIN 100000 UNIT/GM EX POWD: Freq: Four times a day (QID) | CUTANEOUS | Status: DC

## 2015-06-29 NOTE — Progress Notes (Signed)
  Subjective:     Whitney Benton is a 44 y.o. female and is here for a comprehensive physical exam. The patient reports no problems.  History   Social History  . Marital Status: Single    Spouse Name: N/A  . Number of Children: 3  . Years of Education: 14   Occupational History  . Not on file.   Social History Main Topics  . Smoking status: Current Every Day Smoker -- 0.25 packs/day for 8 years    Types: Cigarettes  . Smokeless tobacco: Never Used  . Alcohol Use: Yes     Comment: occasional  . Drug Use: No  . Sexual Activity: Not on file   Other Topics Concern  . Not on file   Social History Narrative   Patient lives at home with mother and father    Patient has 3 children    Patient is single   Patient has 14 years of education    Patient is right handed    Health Maintenance  Topic Date Due  . PAP SMEAR  09/09/1988  . TETANUS/TDAP  09/09/1989  . INFLUENZA VACCINE  07/24/2015  . HIV Screening  Completed    The following portions of the patient's history were reviewed and updated as appropriate: allergies, current medications, past family history, past medical history, past social history, past surgical history and problem list.  Review of Systems Pertinent items are noted in HPI.   Objective:    BP 128/82 mmHg  Pulse 74  Temp(Src) 98.3 F (36.8 C) (Oral)  Ht 5\' 3"  (1.6 m)  Wt 172 lb 9.6 oz (78.291 kg)  BMI 30.58 kg/m2 General appearance: alert, cooperative and no distress Head: Normocephalic, without obvious abnormality, atraumatic Eyes: PERRL, EOMI, anicteric, no injection Neck: no adenopathy, no carotid bruit, no JVD, supple, symmetrical, trachea midline and thyroid not enlarged, symmetric, no tenderness/mass/nodules Lungs: clear to auscultation bilaterally Breasts: normal appearance, no masses or tenderness Heart: regular rate and rhythm, S1, S2 normal, no murmur, click, rub or gallop Abdomen: soft, non-tender; bowel sounds normal; no masses,  no  organomegaly Pelvic: external genitalia normal, no adnexal masses or tenderness, rectovaginal septum normal, uterus surgically absent and vagina normal without discharge Extremities: extremities normal, atraumatic, no cyanosis or edema Pulses: 2+ and symmetric Skin: Skin color, texture, turgor normal. No rashes or lesions Neurologic: Alert and oriented X 3, normal strength and tone. Normal symmetric reflexes. Normal coordination and gait    Assessment:    Healthy female exam.      Plan:   No PAP as cervix surgically absent for benign reasons. Discussed menopausal symptoms - handout given for herbal remidies for hot flashes. Mammogram scheduled.   See After Visit Summary for Counseling Recommendations

## 2015-06-29 NOTE — Patient Instructions (Signed)
Menopause and Herbal Products Menopause is the normal time of life when menstrual periods stop completely. Menopause is complete when you have missed 12 consecutive menstrual periods. It usually occurs between the ages of 48 to 55, with an average age of 51. Very rarely does a woman develop menopause before 45 years old. At menopause, your ovaries stop producing the female hormones, estrogen and progesterone. This can cause undesirable symptoms and also affect your health. Sometimes the symptoms can occur 4 to 5 years before the menopause begins. There is no relationship between menopause and:  Oral contraceptives.  Number of children you had.  Race.  The age your menstrual periods started (menarche). Heavy smokers and very thin women may develop menopause earlier in life. Estrogen and progesterone hormone treatment is the usual method of treating menopausal symptoms. However, there are women who should not take hormone treatment. This is true of:   Women that have breast or uterine cancer.  Women who prefer not to take hormones because of certain side effects (abnormal uterine bleeding).  Women who are afraid that hormones may cause breast cancer.  Women who have a history of liver disease, heart disease, stroke, or blood clots. For these women, there are other medications that may help treat their menopausal symptoms. These medications are found in plants and botanical products. They can be found in the form of herbs, teas, oils, tinctures, and pills.  CAUSES:  The ovaries stop producing the female hormones estrogen and progesterone.  Other causes include:  Surgery to remove both ovaries.  The ovaries stop functioning for no know reason.  Tumors of the pituitary gland in the brain.  Medical disease that affects the ovaries and hormone production.  Radiation treatment to the abdomen or pelvis.  Chemotherapy that affects the ovaries. PHYTOESTROGENS: Phytoestrogens occur  naturally in plants and plant products. They act like estrogen in the body. Herbal medications are made from these plants and botanical steroids. There are 3 types of phytoestrogens:  Isoflavones (genistein and daidzein) are found in soy, garbanzo beans, miso and tofu foods.  Ligins are found in the shell of seeds. They are used to make oils like flaxseed oil. The bacteria in your intestine act on these foods to produce the estrogen-like hormones.  Coumestans are estrogen-like. Some of the foods they are found in include sunflower seeds and bean sprouts. CONDITIONS AND THEIR POSSIBLE HERBAL TREATMENT:  Hot flashes and night sweats.  Soy, black cohosh and evening primrose.  Irritability, insomnia, depression and memory problems.  Chasteberry, ginseng, and soy.  St. John's wort may be helpful for depression. However, there is a concern of it causing cataracts of the eye and may have bad effects on other medications. St. John's wort should not be taken for long time and without your caregiver's advice.  Loss of libido and vaginal and skin dryness.  Wild yam and soy.  Prevention of coronary heart disease and osteoporosis.  Soy and Isoflavones. Several studies have shown that some women benefit from herbal medications, but most of the studies have not consistently shown that these supplements are much better than placebo. Other forms of treatment to help women with menopausal symptoms include a balanced diet, rest, exercise, vitamin and calcium (with vitamin D) supplements, acupuncture, and group therapy when necessary. THOSE WHO SHOULD NOT TAKE HERBAL MEDICATIONS INCLUDE:  Women who are planning on getting pregnant unless told by your caregiver.  Women who are breastfeeding unless told by your caregiver.  Women who are taking other   prescription medications unless told by your caregiver.  Infants, children, and elderly women unless told by your caregiver. Different herbal medications  have different and unmeasured amounts of the herbal ingredients. There are no regulations, quality control, and standardization of the ingredients in herbal medications. Therefore, the amount of the ingredient in the medication may vary from one herb, pill, tea, oil or tincture to another. Many herbal medications can cause serious problems and can even have poisonous effects if taken too much or too long. If problems develop, the medication should be stopped and recorded by your caregiver. HOME CARE INSTRUCTIONS  Do not take or give children herbal medications without your caregiver's advice.  Let your caregiver know all the medications you are taking. This includes prescription, over-the-counter, eye drops, and creams.  Do not take herbal medications longer or more than recommended.  Tell your caregiver about any side effects from the medication. SEEK MEDICAL CARE IF:  You develop a fever of 102 F (38.9 C), or as directed by your caregiver.  You feel sick to your stomach (nauseous), vomit, or have diarrhea.  You develop a rash.  You develop abdominal pain.  You develop severe headaches.  You start to have vision problems.  You feel dizzy or faint.  You start to feel numbness in any part of your body.  You start shaking (have convulsions). Document Released: 05/27/2008 Document Revised: 11/25/2012 Document Reviewed: 12/25/2010 Exit[employer] Patient Information 2015 [employer]. This information is not intended to replace advice given to you by your health care provider. Make sure you discuss any questions you have with your health care provider.  

## 2015-08-01 ENCOUNTER — Ambulatory Visit (HOSPITAL_COMMUNITY): Payer: Medicaid Other

## 2015-10-23 ENCOUNTER — Encounter: Payer: Self-pay | Admitting: Occupational Therapy

## 2015-10-23 NOTE — Therapy (Deleted)
[facility] [address] [place], [place], [address] Phone: [phone]   Fax:  [phone]  Patient Details  Name: Whitney Benton MRN: [id number] Date of Birth: [birth year] Referring Provider:  No ref. provider found  Encounter Date: 10/23/2015  OCCUPATIONAL THERAPY DISCHARGE SUMMARY  Visits from Start of Care: 5  Current functional level related to goals / functional outcomes:     OT Short Term Goals - 12/28/14 0940    OT SHORT TERM GOAL #1   Title Pt will be Mod I HEP   Status Achieved   OT SHORT TERM GOAL #2   Title Pt will reports decreased pain LUE during light functional use to 6/10 or less   Status Not Met  8/10 pain   OT SHORT TERM GOAL #3   Title Pt will be Mod grooming    Status Achieved  met w/ Rt hand only          OT Long Term Goals - 11/23/14 1258    OT LONG TERM GOAL #1   Title Pt will be Mod I upgraded HEP LUE   Baseline --  Assess 01/04/15   Time 6   Period Weeks   Status New   OT LONG TERM GOAL #2   Title Pt will repotrs Mod I light homemaking and ADL's using LUE as non-dominant hand   Baseline --  Assess 01/04/15   Time 6   Period Weeks   Status New   OT LONG TERM GOAL #3   Title Pt will report improved coordination as seen by improved 9 hole peg test score left by 5-10 seconds or more.   Baseline Assess 01/04/15  L=45.66, R = 24.66 seconds   Time 6   Period Weeks   Status New   OT LONG TERM GOAL #4   Title Pt will reports decreased pain LUE during functional activities to 3-4/10 or less   Baseline Assess 01/04/15  7-8 up to 10/10 LUE   Time 6   Period Weeks   Status New     **Unable to reassess all goals as pt did not return to OT   Remaining deficits: Pain, decr coordination/UE functional use.   Education / Equipment: Pt instructed in HEP  Plan: Patient agrees to discharge.  Patient goals were not met. Patient is being discharged due to meeting the stated rehab  goals.  ?????        [facility] 10/23/2015, 9:27 AM  [facility] [address] [place], [place], [address] Phone: [phone]   Fax:  [phone]

## 2015-10-23 NOTE — Therapy (Signed)
This note created in error.  Vianne Bulls, OTR/L 10/23/2015 10:21 AM

## 2016-05-13 ENCOUNTER — Ambulatory Visit (INDEPENDENT_AMBULATORY_CARE_PROVIDER_SITE_OTHER): Payer: Self-pay | Admitting: Obstetrics & Gynecology

## 2016-05-13 VITALS — BP 118/75 | HR 83 | Temp 98.5°F | Resp 18 | Ht 63.0 in | Wt 197.9 lb

## 2016-05-13 DIAGNOSIS — N898 Other specified noninflammatory disorders of vagina: Secondary | ICD-10-CM

## 2016-05-13 DIAGNOSIS — Z01419 Encounter for gynecological examination (general) (routine) without abnormal findings: Secondary | ICD-10-CM

## 2016-05-13 MED ORDER — METRONIDAZOLE 500 MG PO TABS: 500.0000 mg | ORAL_TABLET | Freq: Two times a day (BID) | ORAL | Status: DC

## 2016-05-13 NOTE — Progress Notes (Signed)
GYNECOLOGY CLINIC ANNUAL PREVENTATIVE CARE ENCOUNTER NOTE  Subjective:   Whitney Benton is a 45 y.o. [phone] female here for a routine annual gynecologic exam.  She is s/p TVH for fibroids on 09/16/05. The patient reports no problems. Denies abnormal vaginal bleeding, discharge, pelvic pain, problems with intercourse or other gynecologic concerns.    Gynecologic History No LMP recorded. Patient has had a hysterectomy. Last mammogram: 07/29/2014. Results were: normal  Obstetric History OB History  Gravida Para Term Preterm AB SAB TAB Ectopic Multiple Living  5 3 3  2 2  0 0 0 3    # Outcome Date GA Lbr Len/2nd Weight Sex Delivery Anes PTL Lv  5 Term 06/1995 [redacted]w[redacted]d  5 lb 6 oz (2.438 kg) M CS-LTranv EPI       Comments: [facility] C/S  4 Term 06/1994 [redacted]w[redacted]d  6 lb 5 oz (2.863 kg) M CS-LTranv EPI       Comments: [facility] rpt C/S, had dehydration  3 SAB 1994             Comments: D/C , twins  2 Term 08/1991 [redacted]w[redacted]d  8 lb 6 oz (3.799 kg) M CS-LTranv EPI       Comments: whog- C/S preeclampsia  1 SAB 1992             Comments: no d/C      Past Medical History  Diagnosis Date  . Depression   . Anxiety   . Trichomonas infection     Past Surgical History  Procedure Laterality Date  . Cesarean section    . Vaginal hysterectomy  2006    Fibroids, menorrhagia, benign pathology    Current Outpatient Prescriptions on File Prior to Visit  Medication Sig Dispense Refill  . ibuprofen (ADVIL,MOTRIN) 600 MG tablet Take 1 tablet (600 mg total) by mouth every 6 (six) hours as needed. 30 tablet 0  . nystatin (MYCOSTATIN) powder Apply topically 4 (four) times daily. 15 g 2  . cyclobenzaprine (FLEXERIL) 10 MG tablet Take 1 tablet (10 mg total) by mouth 3 (three) times daily as needed for muscle spasms. (Patient not taking: Reported on 05/13/2016) 30 tablet 0  . sertraline (ZOLOFT) 50 MG tablet Take 50 mg by mouth daily. Reported on 05/13/2016     No current facility-administered medications on file prior to  visit.    Allergies  Allergen Reactions  . Aspirin Hives    Social History   Social History  . Marital Status: Single    Spouse Name: N/A  . Number of Children: 3  . Years of Education: 14   Occupational History  . Not on file.   Social History Main Topics  . Smoking status: Current Every Day Smoker -- 0.25 packs/day for 8 years    Types: Cigarettes  . Smokeless tobacco: Never Used  . Alcohol Use: Yes     Comment: occasional  . Drug Use: No  . Sexual Activity: Not on file   Other Topics Concern  . Not on file   Social History Narrative   Patient lives at home with mother and father    Patient has 3 children    Patient is single   Patient has 14 years of education    Patient is right handed     Family History  Problem Relation Age of Onset  . Diabetes Mother   . Mental illness Mother   . Depression Mother   . Hypertension Mother   . Hypertension Father   .  Diabetes Father     The following portions of the patient's history were reviewed and updated as appropriate: allergies, current medications, past family history, past medical history, past social history, past surgical history and problem list.  Review of Systems Pertinent items noted in HPI and remainder of comprehensive ROS otherwise negative.   Objective:  BP 118/75 mmHg  Pulse 83  Temp(Src) 98.5 F (36.9 C) (Oral)  Resp 18  Ht 5\' 3"  (1.6 m)  Wt 197 lb 14.4 oz (89.767 kg)  BMI 35.07 kg/m2 GENERAL: Well-developed, well-nourished female in no acute distress.  HEENT: Normocephalic, atraumatic. Sclerae anicteric.  NECK: Supple. Normal thyroid.  LUNGS: Clear to auscultation bilaterally.  HEART: Regular rate and rhythm.  BREASTS: Symmetric with everted nipples. No masses, other skin changes, nipple drainage, or lymphadenopathy.  ABDOMEN: Soft, nontender, nondistended. No organomegaly.  PELVIC: Normal external female genitalia. Vagina is pink and rugated. Yellow, foul-smelling discharge, wet  prep sample obtained. Well-healed vaginal cuff. No adnexal mass or tenderness. EXTREMITIES: No cyanosis, clubbing, or edema, 2+ distal pulses.   Assessment:  Annual gynecologic examination s/p TVH for benign indications Abnormal vaginal discharge   Plan:  Will follow up results of wet prep and manage accordingly. Metronidazole presumptively prescribed. Mammogram scholarship application filled out Routine preventative health maintenance measures emphasized. Please refer to After Visit Summary for other counseling recommendations.    Verita Schneiders, MD, [place] Attending Obstetrician & Gynecologist, [place] for [facility]

## 2016-05-13 NOTE — Patient Instructions (Signed)
Preventive Care for Adults, Female A healthy lifestyle and preventive care can promote health and wellness. Preventive health guidelines for women include the following key practices.  A routine yearly physical is a good way to check with your health care provider about your health and preventive screening. It is a chance to share any concerns and updates on your health and to receive a thorough exam.  Visit your dentist for a routine exam and preventive care every 6 months. Brush your teeth twice a day and floss once a day. Good oral hygiene prevents tooth decay and gum disease.  The frequency of eye exams is based on your age, health, family medical history, use of contact lenses, and other factors. Follow your health care provider's recommendations for frequency of eye exams.  Eat a healthy diet. Foods like vegetables, fruits, whole grains, low-fat dairy products, and lean protein foods contain the nutrients you need without too many calories. Decrease your intake of foods high in solid fats, added sugars, and salt. Eat the right amount of calories for you.Get information about a proper diet from your health care provider, if necessary.  Regular physical exercise is one of the most important things you can do for your health. Most adults should get at least 150 minutes of moderate-intensity exercise (any activity that increases your heart rate and causes you to sweat) each week. In addition, most adults need muscle-strengthening exercises on 2 or more days a week.  Maintain a healthy weight. The body mass index (BMI) is a screening tool to identify possible weight problems. It provides an estimate of body fat based on height and weight. Your health care provider can find your BMI and can help you achieve or maintain a healthy weight.For adults 20 years and older:  A BMI below 18.5 is considered underweight.  A BMI of 18.5 to 24.9 is normal.  A BMI of 25 to 29.9 is considered overweight.  A  BMI of 30 and above is considered obese.  Maintain normal blood lipids and cholesterol levels by exercising and minimizing your intake of saturated fat. Eat a balanced diet with plenty of fruit and vegetables. Blood tests for lipids and cholesterol should begin at age 45 and be repeated every 5 years. If your lipid or cholesterol levels are high, you are over 50, or you are at high risk for heart disease, you may need your cholesterol levels checked more frequently.Ongoing high lipid and cholesterol levels should be treated with medicines if diet and exercise are not working.  If you smoke, find out from your health care provider how to quit. If you do not use tobacco, do not start.  Lung cancer screening is recommended for adults aged 45-80 years who are at high risk for developing lung cancer because of a history of smoking. A yearly low-dose CT scan of the lungs is recommended for people who have at least a 30-pack-year history of smoking and are a current smoker or have quit within the past 15 years. A pack year of smoking is smoking an average of 1 pack of cigarettes a day for 1 year (for example: 1 pack a day for 30 years or 2 packs a day for 15 years). Yearly screening should continue until the smoker has stopped smoking for at least 15 years. Yearly screening should be stopped for people who develop a health problem that would prevent them from having lung cancer treatment.  If you are pregnant, do not drink alcohol. If you are  breastfeeding, be very cautious about drinking alcohol. If you are not pregnant and choose to drink alcohol, do not have more than 1 drink per day. One drink is considered to be 12 ounces (355 mL) of beer, 5 ounces (148 mL) of wine, or 1.5 ounces (44 mL) of liquor.  Avoid use of street drugs. Do not share needles with anyone. Ask for help if you need support or instructions about stopping the use of drugs.  High blood pressure causes heart disease and increases the risk  of stroke. Your blood pressure should be checked at least every 1 to 2 years. Ongoing high blood pressure should be treated with medicines if weight loss and exercise do not work.  If you are 55-79 years old, ask your health care provider if you should take aspirin to prevent strokes.  Diabetes screening is done by taking a blood sample to check your blood glucose level after you have not eaten for a certain period of time (fasting). If you are not overweight and you do not have risk factors for diabetes, you should be screened once every 3 years starting at age 45. If you are overweight or obese and you are 40-70 years of age, you should be screened for diabetes every year as part of your cardiovascular risk assessment.  Breast cancer screening is essential preventive care for women. You should practice "breast self-awareness." This means understanding the normal appearance and feel of your breasts and may include breast self-examination. Any changes detected, no matter how small, should be reported to a health care provider. Women in their 20s and 30s should have a clinical breast exam (CBE) by a health care provider as part of a regular health exam every 1 to 3 years. After age 40, women should have a CBE every year. Starting at age 40, women should consider having a mammogram (breast X-ray test) every year. Women who have a family history of breast cancer should talk to their health care provider about genetic screening. Women at a high risk of breast cancer should talk to their health care providers about having an MRI and a mammogram every year.  Breast cancer gene (BRCA)-related cancer risk assessment is recommended for women who have family members with BRCA-related cancers. BRCA-related cancers include breast, ovarian, tubal, and peritoneal cancers. Having family members with these cancers may be associated with an increased risk for harmful changes (mutations) in the breast cancer genes BRCA1 and  BRCA2. Results of the assessment will determine the need for genetic counseling and BRCA1 and BRCA2 testing.  Your health care provider may recommend that you be screened regularly for cancer of the pelvic organs (ovaries, uterus, and vagina). This screening involves a pelvic examination, including checking for microscopic changes to the surface of your cervix (Pap test). You may be encouraged to have this screening done every 3 years, beginning at age 21.  For women ages 30-65, health care providers may recommend pelvic exams and Pap testing every 3 years, or they may recommend the Pap and pelvic exam, combined with testing for human papilloma virus (HPV), every 5 years. Some types of HPV increase your risk of cervical cancer. Testing for HPV may also be done on women of any age with unclear Pap test results.  Other health care providers may not recommend any screening for nonpregnant women who are considered low risk for pelvic cancer and who do not have symptoms. Ask your health care provider if a screening pelvic exam is right for   you.  If you have had past treatment for cervical cancer or a condition that could lead to cancer, you need Pap tests and screening for cancer for at least 20 years after your treatment. If Pap tests have been discontinued, your risk factors (such as having a new sexual partner) need to be reassessed to determine if screening should resume. Some women have medical problems that increase the chance of getting cervical cancer. In these cases, your health care provider may recommend more frequent screening and Pap tests.  Colorectal cancer can be detected and often prevented. Most routine colorectal cancer screening begins at the age of 50 years and continues through age 75 years. However, your health care provider may recommend screening at an earlier age if you have risk factors for colon cancer. On a yearly basis, your health care provider may provide home test kits to check  for hidden blood in the stool. Use of a small camera at the end of a tube, to directly examine the colon (sigmoidoscopy or colonoscopy), can detect the earliest forms of colorectal cancer. Talk to your health care provider about this at age 50, when routine screening begins. Direct exam of the colon should be repeated every 5-10 years through age 75 years, unless early forms of precancerous polyps or small growths are found.  People who are at an increased risk for hepatitis B should be screened for this virus. You are considered at high risk for hepatitis B if:  You were born in a country where hepatitis B occurs often. Talk with your health care provider about which countries are considered high risk.  Your parents were born in a high-risk country and you have not received a shot to protect against hepatitis B (hepatitis B vaccine).  You have HIV or AIDS.  You use needles to inject street drugs.  You live with, or have sex with, someone who has hepatitis B.  You get hemodialysis treatment.  You take certain medicines for conditions like cancer, organ transplantation, and autoimmune conditions.  Hepatitis C blood testing is recommended for all people born from 1945 through 1965 and any individual with known risks for hepatitis C.  Practice safe sex. Use condoms and avoid high-risk sexual practices to reduce the spread of sexually transmitted infections (STIs). STIs include gonorrhea, chlamydia, syphilis, trichomonas, herpes, HPV, and human immunodeficiency virus (HIV). Herpes, HIV, and HPV are viral illnesses that have no cure. They can result in disability, cancer, and death.  You should be screened for sexually transmitted illnesses (STIs) including gonorrhea and chlamydia if:  You are sexually active and are younger than 24 years.  You are older than 24 years and your health care provider tells you that you are at risk for this type of infection.  Your sexual activity has changed  since you were last screened and you are at an increased risk for chlamydia or gonorrhea. Ask your health care provider if you are at risk.  If you are at risk of being infected with HIV, it is recommended that you take a prescription medicine daily to prevent HIV infection. This is called preexposure prophylaxis (PrEP). You are considered at risk if:  You are sexually active and do not regularly use condoms or know the HIV status of your partner(s).  You take drugs by injection.  You are sexually active with a partner who has HIV.  Talk with your health care provider about whether you are at high risk of being infected with HIV. If   you choose to begin PrEP, you should first be tested for HIV. You should then be tested every 3 months for as long as you are taking PrEP.  Osteoporosis is a disease in which the bones lose minerals and strength with aging. This can result in serious bone fractures or breaks. The risk of osteoporosis can be identified using a bone density scan. Women ages 67 years and over and women at risk for fractures or osteoporosis should discuss screening with their health care providers. Ask your health care provider whether you should take a calcium supplement or vitamin D to reduce the rate of osteoporosis.  Menopause can be associated with physical symptoms and risks. Hormone replacement therapy is available to decrease symptoms and risks. You should talk to your health care provider about whether hormone replacement therapy is right for you.  Use sunscreen. Apply sunscreen liberally and repeatedly throughout the day. You should seek shade when your shadow is shorter than you. Protect yourself by wearing long sleeves, pants, a wide-brimmed hat, and sunglasses year round, whenever you are outdoors.  Once a month, do a whole body skin exam, using a mirror to look at the skin on your back. Tell your health care provider of new moles, moles that have irregular borders, moles that  are larger than a pencil eraser, or moles that have changed in shape or color.  Stay current with required vaccines (immunizations).  Influenza vaccine. All adults should be immunized every year.  Tetanus, diphtheria, and acellular pertussis (Td, Tdap) vaccine. Pregnant women should receive 1 dose of Tdap vaccine during each pregnancy. The dose should be obtained regardless of the length of time since the last dose. Immunization is preferred during the 27th-36th week of gestation. An adult who has not previously received Tdap or who does not know her vaccine status should receive 1 dose of Tdap. This initial dose should be followed by tetanus and diphtheria toxoids (Td) booster doses every 10 years. Adults with an unknown or incomplete history of completing a 3-dose immunization series with Td-containing vaccines should begin or complete a primary immunization series including a Tdap dose. Adults should receive a Td booster every 10 years.  Varicella vaccine. An adult without evidence of immunity to varicella should receive 2 doses or a second dose if she has previously received 1 dose. Pregnant females who do not have evidence of immunity should receive the first dose after pregnancy. This first dose should be obtained before leaving the health care facility. The second dose should be obtained 4-8 weeks after the first dose.  Human papillomavirus (HPV) vaccine. Females aged 13-26 years who have not received the vaccine previously should obtain the 3-dose series. The vaccine is not recommended for use in pregnant females. However, pregnancy testing is not needed before receiving a dose. If a female is found to be pregnant after receiving a dose, no treatment is needed. In that case, the remaining doses should be delayed until after the pregnancy. Immunization is recommended for any person with an immunocompromised condition through the age of 61 years if she did not get any or all doses earlier. During the  3-dose series, the second dose should be obtained 4-8 weeks after the first dose. The third dose should be obtained 24 weeks after the first dose and 16 weeks after the second dose.  Zoster vaccine. One dose is recommended for adults aged 30 years or older unless certain conditions are present.  Measles, mumps, and rubella (MMR) vaccine. Adults born  before 1957 generally are considered immune to measles and mumps. Adults born in 1957 or later should have 1 or more doses of MMR vaccine unless there is a contraindication to the vaccine or there is laboratory evidence of immunity to each of the three diseases. A routine second dose of MMR vaccine should be obtained at least 28 days after the first dose for students attending postsecondary schools, health care workers, or international travelers. People who received inactivated measles vaccine or an unknown type of measles vaccine during 1963-1967 should receive 2 doses of MMR vaccine. People who received inactivated mumps vaccine or an unknown type of mumps vaccine before 1979 and are at high risk for mumps infection should consider immunization with 2 doses of MMR vaccine. For females of childbearing age, rubella immunity should be determined. If there is no evidence of immunity, females who are not pregnant should be vaccinated. If there is no evidence of immunity, females who are pregnant should delay immunization until after pregnancy. Unvaccinated health care workers born before 1957 who lack laboratory evidence of measles, mumps, or rubella immunity or laboratory confirmation of disease should consider measles and mumps immunization with 2 doses of MMR vaccine or rubella immunization with 1 dose of MMR vaccine.  Pneumococcal 13-valent conjugate (PCV13) vaccine. When indicated, a person who is uncertain of his immunization history and has no record of immunization should receive the PCV13 vaccine. All adults 65 years of age and older should receive this  vaccine. An adult aged 19 years or older who has certain medical conditions and has not been previously immunized should receive 1 dose of PCV13 vaccine. This PCV13 should be followed with a dose of pneumococcal polysaccharide (PPSV23) vaccine. Adults who are at high risk for pneumococcal disease should obtain the PPSV23 vaccine at least 8 weeks after the dose of PCV13 vaccine. Adults older than 46 years of age who have normal immune system function should obtain the PPSV23 vaccine dose at least 1 year after the dose of PCV13 vaccine.  Pneumococcal polysaccharide (PPSV23) vaccine. When PCV13 is also indicated, PCV13 should be obtained first. All adults aged 65 years and older should be immunized. An adult younger than age 65 years who has certain medical conditions should be immunized. Any person who resides in a nursing home or long-term care facility should be immunized. An adult smoker should be immunized. People with an immunocompromised condition and certain other conditions should receive both PCV13 and PPSV23 vaccines. People with human immunodeficiency virus (HIV) infection should be immunized as soon as possible after diagnosis. Immunization during chemotherapy or radiation therapy should be avoided. Routine use of PPSV23 vaccine is not recommended for American Indians, [place] Natives, or people younger than 65 years unless there are medical conditions that require PPSV23 vaccine. When indicated, people who have unknown immunization and have no record of immunization should receive PPSV23 vaccine. One-time revaccination 5 years after the first dose of PPSV23 is recommended for people aged 19-64 years who have chronic kidney failure, nephrotic syndrome, asplenia, or immunocompromised conditions. People who received 1-2 doses of PPSV23 before age 65 years should receive another dose of PPSV23 vaccine at age 65 years or later if at least 5 years have passed since the previous dose. Doses of PPSV23 are not  needed for people immunized with PPSV23 at or after age 65 years.  Meningococcal vaccine. Adults with asplenia or persistent complement component deficiencies should receive 2 doses of quadrivalent meningococcal conjugate (MenACWY-D) vaccine. The doses should be obtained   at least 2 months apart. Microbiologists working with certain meningococcal bacteria, [place] recruits, people at risk during an outbreak, and people who travel to or live in countries with a high rate of meningitis should be immunized. A first-year college student up through age 34 years who is living in a residence hall should receive a dose if she did not receive a dose on or after her 16th birthday. Adults who have certain high-risk conditions should receive one or more doses of vaccine.  Hepatitis A vaccine. Adults who wish to be protected from this disease, have certain high-risk conditions, work with hepatitis A-infected animals, work in hepatitis A research labs, or travel to or work in countries with a high rate of hepatitis A should be immunized. Adults who were previously unvaccinated and who anticipate close contact with an international adoptee during the first 60 days after arrival in the [place] from a country with a high rate of hepatitis A should be immunized.  Hepatitis B vaccine. Adults who wish to be protected from this disease, have certain high-risk conditions, may be exposed to blood or other infectious body fluids, are household contacts or sex partners of hepatitis B positive people, are clients or workers in certain care facilities, or travel to or work in countries with a high rate of hepatitis B should be immunized.  Haemophilus influenzae type b (Hib) vaccine. A previously unvaccinated person with asplenia or sickle cell disease or having a scheduled splenectomy should receive 1 dose of Hib vaccine. Regardless of previous immunization, a recipient of a hematopoietic stem cell transplant should receive a  3-dose series 6-12 months after her successful transplant. Hib vaccine is not recommended for adults with HIV infection. Preventive Services / Frequency Ages 35 to 4 years  Blood pressure check.** / Every 3-5 years.  Lipid and cholesterol check.** / Every 5 years beginning at age 60.  Clinical breast exam.** / Every 3 years for women in their 71s and 10s.  BRCA-related cancer risk assessment.** / For women who have family members with a BRCA-related cancer (breast, ovarian, tubal, or peritoneal cancers).  Pap test.** / Every 2 years from ages 76 through 26. Every 3 years starting at age 61 through age 76 or 93 with a history of 3 consecutive normal Pap tests.  HPV screening.** / Every 3 years from ages 37 through ages 60 to 51 with a history of 3 consecutive normal Pap tests.  Hepatitis C blood test.** / For any individual with known risks for hepatitis C.  Skin self-exam. / Monthly.  Influenza vaccine. / Every year.  Tetanus, diphtheria, and acellular pertussis (Tdap, Td) vaccine.** / Consult your health care provider. Pregnant women should receive 1 dose of Tdap vaccine during each pregnancy. 1 dose of Td every 10 years.  Varicella vaccine.** / Consult your health care provider. Pregnant females who do not have evidence of immunity should receive the first dose after pregnancy.  HPV vaccine. / 3 doses over 6 months, if 93 and younger. The vaccine is not recommended for use in pregnant females. However, pregnancy testing is not needed before receiving a dose.  Measles, mumps, rubella (MMR) vaccine.** / You need at least 1 dose of MMR if you were born in 1957 or later. You may also need a 2nd dose. For females of childbearing age, rubella immunity should be determined. If there is no evidence of immunity, females who are not pregnant should be vaccinated. If there is no evidence of immunity, females who are  pregnant should delay immunization until after pregnancy.  Pneumococcal  13-valent conjugate (PCV13) vaccine.** / Consult your health care provider.  Pneumococcal polysaccharide (PPSV23) vaccine.** / 1 to 2 doses if you smoke cigarettes or if you have certain conditions.  Meningococcal vaccine.** / 1 dose if you are age 68 to 8 years and a Market researcher living in a residence hall, or have one of several medical conditions, you need to get vaccinated against meningococcal disease. You may also need additional booster doses.  Hepatitis A vaccine.** / Consult your health care provider.  Hepatitis B vaccine.** / Consult your health care provider.  Haemophilus influenzae type b (Hib) vaccine.** / Consult your health care provider. Ages 7 to 53 years  Blood pressure check.** / Every year.  Lipid and cholesterol check.** / Every 5 years beginning at age 25 years.  Lung cancer screening. / Every year if you are aged 11-80 years and have a 30-pack-year history of smoking and currently smoke or have quit within the past 15 years. Yearly screening is stopped once you have quit smoking for at least 15 years or develop a health problem that would prevent you from having lung cancer treatment.  Clinical breast exam.** / Every year after age 48 years.  BRCA-related cancer risk assessment.** / For women who have family members with a BRCA-related cancer (breast, ovarian, tubal, or peritoneal cancers).  Mammogram.** / Every year beginning at age 41 years and continuing for as long as you are in good health. Consult with your health care provider.  Pap test.** / Every 3 years starting at age 65 years through age 37 or 70 years with a history of 3 consecutive normal Pap tests.  HPV screening.** / Every 3 years from ages 72 years through ages 60 to 40 years with a history of 3 consecutive normal Pap tests.  Fecal occult blood test (FOBT) of stool. / Every year beginning at age 21 years and continuing until age 5 years. You may not need to do this test if you get  a colonoscopy every 10 years.  Flexible sigmoidoscopy or colonoscopy.** / Every 5 years for a flexible sigmoidoscopy or every 10 years for a colonoscopy beginning at age 35 years and continuing until age 48 years.  Hepatitis C blood test.** / For all people born from [birth year] through 1965 and any individual with known risks for hepatitis C.  Skin self-exam. / Monthly.  Influenza vaccine. / Every year.  Tetanus, diphtheria, and acellular pertussis (Tdap/Td) vaccine.** / Consult your health care provider. Pregnant women should receive 1 dose of Tdap vaccine during each pregnancy. 1 dose of Td every 10 years.  Varicella vaccine.** / Consult your health care provider. Pregnant females who do not have evidence of immunity should receive the first dose after pregnancy.  Zoster vaccine.** / 1 dose for adults aged 30 years or older.  Measles, mumps, rubella (MMR) vaccine.** / You need at least 1 dose of MMR if you were born in 1957 or later. You may also need a second dose. For females of childbearing age, rubella immunity should be determined. If there is no evidence of immunity, females who are not pregnant should be vaccinated. If there is no evidence of immunity, females who are pregnant should delay immunization until after pregnancy.  Pneumococcal 13-valent conjugate (PCV13) vaccine.** / Consult your health care provider.  Pneumococcal polysaccharide (PPSV23) vaccine.** / 1 to 2 doses if you smoke cigarettes or if you have certain conditions.  Meningococcal vaccine.** /  Consult your health care provider.  Hepatitis A vaccine.** / Consult your health care provider.  Hepatitis B vaccine.** / Consult your health care provider.  Haemophilus influenzae type b (Hib) vaccine.** / Consult your health care provider. Ages 64 years and over  Blood pressure check.** / Every year.  Lipid and cholesterol check.** / Every 5 years beginning at age 23 years.  Lung cancer screening. / Every year if you  are aged 16-80 years and have a 30-pack-year history of smoking and currently smoke or have quit within the past 15 years. Yearly screening is stopped once you have quit smoking for at least 15 years or develop a health problem that would prevent you from having lung cancer treatment.  Clinical breast exam.** / Every year after age 74 years.  BRCA-related cancer risk assessment.** / For women who have family members with a BRCA-related cancer (breast, ovarian, tubal, or peritoneal cancers).  Mammogram.** / Every year beginning at age 44 years and continuing for as long as you are in good health. Consult with your health care provider.  Pap test.** / Every 3 years starting at age 58 years through age 22 or 39 years with 3 consecutive normal Pap tests. Testing can be stopped between 65 and 70 years with 3 consecutive normal Pap tests and no abnormal Pap or HPV tests in the past 10 years.  HPV screening.** / Every 3 years from ages 64 years through ages 70 or 61 years with a history of 3 consecutive normal Pap tests. Testing can be stopped between 65 and 70 years with 3 consecutive normal Pap tests and no abnormal Pap or HPV tests in the past 10 years.  Fecal occult blood test (FOBT) of stool. / Every year beginning at age 40 years and continuing until age 27 years. You may not need to do this test if you get a colonoscopy every 10 years.  Flexible sigmoidoscopy or colonoscopy.** / Every 5 years for a flexible sigmoidoscopy or every 10 years for a colonoscopy beginning at age 7 years and continuing until age 32 years.  Hepatitis C blood test.** / For all people born from [birth year] through 1966 and any individual with known risks for hepatitis C.  Osteoporosis screening.** / A one-time screening for women ages 30 years and over and women at risk for fractures or osteoporosis.  Skin self-exam. / Monthly.  Influenza vaccine. / Every year.  Tetanus, diphtheria, and acellular pertussis (Tdap/Td)  vaccine.** / 1 dose of Td every 10 years.  Varicella vaccine.** / Consult your health care provider.  Zoster vaccine.** / 1 dose for adults aged 35 years or older.  Pneumococcal 13-valent conjugate (PCV13) vaccine.** / Consult your health care provider.  Pneumococcal polysaccharide (PPSV23) vaccine.** / 1 dose for all adults aged 46 years and older.  Meningococcal vaccine.** / Consult your health care provider.  Hepatitis A vaccine.** / Consult your health care provider.  Hepatitis B vaccine.** / Consult your health care provider.  Haemophilus influenzae type b (Hib) vaccine.** / Consult your health care provider. ** Family history and personal history of risk and conditions may change your health care provider's recommendations.   This information is not intended to replace advice given to you by your health care provider. Make sure you discuss any questions you have with your health care provider.   Document Released: 02/04/2002 Document Revised: 12/30/2014 Document Reviewed: 05/06/2011 Elsevier Interactive Patient Education [employer].

## 2016-05-14 LAB — WET PREP, GENITAL
Trich, Wet Prep: NONE SEEN
Yeast Wet Prep HPF POC: NONE SEEN

## 2016-12-24 ENCOUNTER — Encounter (HOSPITAL_COMMUNITY): Payer: Self-pay | Admitting: Emergency Medicine

## 2016-12-24 ENCOUNTER — Emergency Department (HOSPITAL_COMMUNITY): Payer: Medicaid Other

## 2016-12-24 ENCOUNTER — Emergency Department (HOSPITAL_COMMUNITY)
Admission: EM | Admit: 2016-12-24 | Discharge: 2016-12-24 | Disposition: A | Discharge status: Home / Self Care | Payer: Medicaid Other | Attending: Emergency Medicine | Admitting: Emergency Medicine

## 2016-12-24 DIAGNOSIS — F17210 Nicotine dependence, cigarettes, uncomplicated: Secondary | ICD-10-CM | POA: Insufficient documentation

## 2016-12-24 DIAGNOSIS — N200 Calculus of kidney: Secondary | ICD-10-CM | POA: Insufficient documentation

## 2016-12-24 DIAGNOSIS — R109 Unspecified abdominal pain: Secondary | ICD-10-CM

## 2016-12-24 DIAGNOSIS — R1013 Epigastric pain: Secondary | ICD-10-CM

## 2016-12-24 LAB — COMPREHENSIVE METABOLIC PANEL
ALBUMIN: 3.6 g/dL (ref 3.5–5.0)
ALK PHOS: 67 U/L (ref 38–126)
ALT: 17 U/L (ref 14–54)
AST: 21 U/L (ref 15–41)
Anion gap: 5 (ref 5–15)
BUN: 16 mg/dL (ref 6–20)
CO2: 29 mmol/L (ref 22–32)
CREATININE: 0.85 mg/dL (ref 0.44–1.00)
Calcium: 8.9 mg/dL (ref 8.9–10.3)
Chloride: 107 mmol/L (ref 101–111)
GFR calc Af Amer: >60 mL/min (ref >60)
GLUCOSE: 118 mg/dL — AB (ref 65–99)
Potassium: 4.3 mmol/L (ref 3.5–5.1)
Sodium: 141 mmol/L (ref 135–145)
TOTAL PROTEIN: 6.1 g/dL — AB (ref 6.5–8.1)
Total Bilirubin: 0.3 mg/dL (ref 0.3–1.2)

## 2016-12-24 LAB — URINALYSIS, ROUTINE W REFLEX MICROSCOPIC
Bilirubin Urine: NEGATIVE
GLUCOSE, UA: NEGATIVE mg/dL
Hgb urine dipstick: NEGATIVE
Ketones, ur: NEGATIVE mg/dL
LEUKOCYTES UA: NEGATIVE
NITRITE: NEGATIVE
PROTEIN: NEGATIVE mg/dL
Specific Gravity, Urine: 1.018 (ref 1.005–1.030)
pH: 7 (ref 5.0–8.0)

## 2016-12-24 LAB — LIPASE, BLOOD: LIPASE: 32 U/L (ref 11–51)

## 2016-12-24 LAB — CBC WITH DIFFERENTIAL/PLATELET
Basophils Absolute: 0 10*3/uL (ref 0.0–0.1)
Basophils Relative: 0 %
Eosinophils Absolute: 0.2 10*3/uL (ref 0.0–0.7)
Eosinophils Relative: 3 %
HCT: 37.9 % (ref 36.0–46.0)
HEMOGLOBIN: 12.5 g/dL (ref 12.0–15.0)
Lymphocytes Relative: 33 %
Lymphs Abs: 2.4 10*3/uL (ref 0.7–4.0)
MCH: 27.5 pg (ref 26.0–34.0)
MCHC: 33 g/dL (ref 30.0–36.0)
MCV: 83.3 fL (ref 78.0–100.0)
Monocytes Absolute: 0.5 10*3/uL (ref 0.1–1.0)
Monocytes Relative: 6 %
Neutro Abs: 4.2 10*3/uL (ref 1.7–7.7)
Neutrophils Relative %: 58 %
Platelets: 373 10*3/uL (ref 150–400)
RBC: 4.55 MIL/uL (ref 3.87–5.11)
RDW: 13.9 % (ref 11.5–15.5)
WBC: 7.3 10*3/uL (ref 4.0–10.5)

## 2016-12-24 MED ORDER — NAPROXEN 500 MG PO TABS: 500.0000 mg | ORAL_TABLET | Freq: Two times a day (BID) | ORAL | 0 refills | Status: DC | PRN

## 2016-12-24 MED ORDER — HYDROCODONE-ACETAMINOPHEN 5-325 MG PO TABS: 1.0000 | ORAL_TABLET | Freq: Four times a day (QID) | ORAL | 0 refills | Status: DC | PRN

## 2016-12-24 MED ORDER — KETOROLAC TROMETHAMINE 30 MG/ML IJ SOLN
30.0000 mg | Freq: Once | INTRAMUSCULAR | Status: AC
  Administered 2016-12-24: 30 mg via INTRAVENOUS
  Filled 2016-12-24: qty 1

## 2016-12-24 MED ORDER — MORPHINE SULFATE (PF) 4 MG/ML IV SOLN
4.0000 mg | Freq: Once | INTRAVENOUS | Status: AC
  Administered 2016-12-24: 4 mg via INTRAVENOUS
  Filled 2016-12-24: qty 1

## 2016-12-24 MED ORDER — ONDANSETRON 4 MG PO TBDP: 4.0000 mg | ORAL_TABLET | Freq: Three times a day (TID) | ORAL | 0 refills | Status: DC | PRN

## 2016-12-24 NOTE — ED Provider Notes (Signed)
[facility] DEPT Provider Note   CSN: [id number] Arrival date & time: 12/24/16  [id number]     History   Chief Complaint Chief Complaint  Patient presents with  . Flank Pain    HPI Whitney Benton is a 46 y.o. female with a PMHx of anxiety, depression, L arm paresthesias/weakness, and a PSHx of total vaginal hysterectomy for uterine fibroids, who presents to the ED with complaints of gradual onset left flank pain that began 4 days ago. She describes the pain is 9/10 constant sharp and dull nonradiating left flank pain worse with movement and unrelieved with ibuprofen. She also reports that she's had intermittent epigastric abdominal pain for several months, which she states is unrelated to her flank pain, and not currently ongoing at this time. She reports that she gets bloated and distended intermittently in the epigastric area and then a spontaneously resolved. She has not seen her PCP for this, and this is not her reason for coming to the emergency room today.  She denies fevers, chills, CP, SOB, current abd pain, N/V/D/C, obstipation, melena, hematochezia, hematuria, dysuria, malodorous urine, urinary frequency, vaginal bleeding/discharge, myalgias, arthralgias, new/worse numbness/tingling, weakness, or any other complaints at this time. Denies hx of kidney stones. Denies recent travel, sick contacts, suspicious food intake, EtOH use, or chronic NSAID use.   The history is provided by the patient and medical records. No language interpreter was used.  Flank Pain  This is a new problem. The current episode started more than 2 days ago. The problem occurs constantly. The problem has not changed since onset.Pertinent negatives include no chest pain, no abdominal pain (chronic, intermittent, not currently going on) and no shortness of breath. Exacerbated by: movement. Nothing relieves the symptoms. Treatments tried: ibuprofen. The treatment provided no relief.    Past Medical History:  Diagnosis  Date  . Anxiety   . Depression   . Trichomonas infection     Patient Active Problem List   Diagnosis Date Noted  . Left arm weakness 12/06/2014  . Left arm pain 12/06/2014  . Paresthesias/numbness 12/06/2014  . Left hand weakness 11/14/2014  . Tobacco abuse 11/14/2014  . Hypokalemia 11/14/2014  . Depression   . Mixed incontinence 06/27/2014  . History of TVH in 2006 for fibroids and menorrhagia; benign pathology 09/08/2011    Past Surgical History:  Procedure Laterality Date  . CESAREAN SECTION    . VAGINAL HYSTERECTOMY  2006   Fibroids, menorrhagia, benign pathology    OB History    Gravida Para Term Preterm AB Living   5 3 3   2 3    SAB TAB Ectopic Multiple Live Births   2 0 0 0         Home Medications    Prior to Admission medications   Medication Sig Start Date End Date Taking? Authorizing Provider  cyclobenzaprine (FLEXERIL) 10 MG tablet Take 1 tablet (10 mg total) by mouth 3 (three) times daily as needed for muscle spasms. Patient not taking: Reported on 05/13/2016 06/09/14   Hazel Sams, PA-C  ibuprofen (ADVIL,MOTRIN) 600 MG tablet Take 1 tablet (600 mg total) by mouth every 6 (six) hours as needed. 03/24/15   Paticia Stack, PA-C  metroNIDAZOLE (FLAGYL) 500 MG tablet Take 1 tablet (500 mg total) by mouth 2 (two) times daily. 05/13/16   Osborne Oman, MD  nystatin (MYCOSTATIN) powder Apply topically 4 (four) times daily. 06/29/15   Truett Mainland, DO  sertraline (ZOLOFT) 50 MG tablet Take 50  mg by mouth daily. Reported on 05/13/2016    Historical Provider, MD    Family History Family History  Problem Relation Age of Onset  . Diabetes Mother   . Mental illness Mother   . Depression Mother   . Hypertension Mother   . Hypertension Father   . Diabetes Father     Social History Social History  Substance Use Topics  . Smoking status: Current Every Day Smoker    Packs/day: 0.25    Years: 8.00    Types: Cigarettes  . Smokeless tobacco: Never Used    . Alcohol use Yes     Comment: occasional     Allergies   Aspirin   Review of Systems Review of Systems  Constitutional: Negative for chills and fever.  Respiratory: Negative for shortness of breath.   Cardiovascular: Negative for chest pain.  Gastrointestinal: Negative for abdominal pain (chronic, intermittent, not currently going on), blood in stool, constipation, diarrhea, nausea and vomiting.  Genitourinary: Positive for flank pain. Negative for dysuria, frequency, hematuria, vaginal bleeding and vaginal discharge.  Musculoskeletal: Negative for arthralgias and myalgias.  Skin: Negative for color change.  Allergic/Immunologic: Negative for immunocompromised state.  Neurological: Negative for weakness and numbness.  Psychiatric/Behavioral: Negative for confusion.   10 Systems reviewed and are negative for acute change except as noted in the HPI.   Physical Exam Updated Vital Signs BP 128/91 (BP Location: Right Arm)   Pulse 76   Temp 97.9 F (36.6 C) (Oral)   Resp 16   Ht 5\' 3"  (1.6 m)   Wt 93.9 kg   SpO2 100%   BMI 36.67 kg/m   Physical Exam  Constitutional: She is oriented to person, place, and time. Vital signs are normal. She appears well-developed and well-nourished.  Non-toxic appearance. No distress.  Afebrile, nontoxic, NAD  HENT:  Head: Normocephalic and atraumatic.  Mouth/Throat: Oropharynx is clear and moist and mucous membranes are normal.  Eyes: Conjunctivae and EOM are normal. Right eye exhibits no discharge. Left eye exhibits no discharge.  Neck: Normal range of motion. Neck supple.  Cardiovascular: Normal rate, regular rhythm, normal heart sounds and intact distal pulses.  Exam reveals no gallop and no friction rub.   No murmur heard. Pulmonary/Chest: Effort normal and breath sounds normal. No respiratory distress. She has no decreased breath sounds. She has no wheezes. She has no rhonchi. She has no rales.  Abdominal: Soft. Normal appearance and  bowel sounds are normal. She exhibits no distension. There is tenderness in the epigastric area. There is CVA tenderness. There is no rigidity, no rebound, no guarding, no tenderness at McBurney's point and negative Murphy's sign.  Soft, obese but nondistended, +BS throughout, with very mild epigastric TTP, no r/g/r, neg murphy's, neg mcburney's, with moderate L sided CVA TTP   Musculoskeletal: Normal range of motion.  Neurological: She is alert and oriented to person, place, and time. She has normal strength. No sensory deficit.  Skin: Skin is warm, dry and intact. No rash noted.  Psychiatric: She has a normal mood and affect.  Nursing note and vitals reviewed.    ED Treatments / Results  Labs (all labs ordered are listed, but only abnormal results are displayed) Labs Reviewed  COMPREHENSIVE METABOLIC PANEL - Abnormal; Notable for the following:       Result Value   Glucose, Bld 118 (*)    Total Protein 6.1 (*)    All other components within normal limits  URINALYSIS, ROUTINE W REFLEX MICROSCOPIC  CBC WITH DIFFERENTIAL/PLATELET  LIPASE, BLOOD    EKG  EKG Interpretation None       Radiology Ct Renal Stone Study  Result Date: 12/24/2016 CLINICAL DATA:  Left flank pain. EXAM: CT ABDOMEN AND PELVIS WITHOUT CONTRAST TECHNIQUE: Multidetector CT imaging of the abdomen and pelvis was performed following the standard protocol without IV contrast. COMPARISON:  CT scan of September 15, 2006. FINDINGS: Lower chest: No acute abnormality. Hepatobiliary: No focal liver abnormality is seen. No gallstones, gallbladder wall thickening, or biliary dilatation. Pancreas: Unremarkable. No pancreatic ductal dilatation or surrounding inflammatory changes. Spleen: Normal in size without focal abnormality. Adrenals/Urinary Tract: Adrenal glands are unremarkable. Small nonobstructive left renal calculus is noted. No hydronephrosis or renal obstruction is noted. Bladder is unremarkable. Stomach/Bowel: Stomach  is within normal limits. Appendix appears normal. No evidence of bowel wall thickening, distention, or inflammatory changes. Vascular/Lymphatic: No significant vascular findings are present. No enlarged abdominal or pelvic lymph nodes. Reproductive: Status post hysterectomy. No adnexal masses. Other: No abdominal wall hernia or abnormality. No abdominopelvic ascites. Musculoskeletal: No acute or significant osseous findings. IMPRESSION: Small nonobstructive left renal calculus. No hydronephrosis or renal obstruction is noted. No other abnormality seen in the abdomen or pelvis. Electronically Signed   By: Marijo Conception, M.D.   On: 12/24/2016 10:04    Procedures Procedures (including critical care time)  Medications Ordered in ED Medications  morphine 4 MG/ML injection 4 mg (4 mg Intravenous Given 12/24/16 0814)  ketorolac (TORADOL) 30 MG/ML injection 30 mg (30 mg Intravenous Given 12/24/16 1056)     Initial Impression / Assessment and Plan / ED Course  I have reviewed the triage vital signs and the nursing notes.  Pertinent labs & imaging results that were available during my care of the patient were reviewed by me and considered in my medical decision making (see chart for details).  Clinical Course     46 y.o. female here with L flank pain x4 days; also reports epigastric pain x several months which is intermittent but not ongoing today and unrelated to the flank pain she has. On exam, very mild epigastric tenderness, nonperitoneal; moderate L CVA TTP. Neg murphy's, and no lower abdominal tenderness, no vaginal complaints. Doubt need for pelvic exam at this point, especially since pt has had a hysterectomy; doubt ovarian torsion given duration of symptoms and lack of lower abd pain. ?Nephrolithiasis. Will get U/A, CBC w/diff, CMP, lipase, and CT renal study. Will give pain meds then reassess shortly.   11:19 AM CBC w/diff unremarkable. CMP unremarkable. Lipase WNL. U/A unremarkable. CT renal  showing small L renal stone, no ureterolithiasis which could indicate that she already passed a stone previously. CT findings c/w location of symptoms thus this is likely the source. Pain initially not improved with morphine, give toradol which helped minimally, but pt states she is ready to go home. Will d/c home with zofran, naprosyn, norco, and urine strainer; advised tums/maalox/zantac PRN epigastric pain; f/up with PCP in 1wk for recheck of symptoms as well as ongoing management of her epigastric abd pain; and f/up with urology in 1-2wks for recheck of flank pain symptoms and kidney stone. I explained the diagnosis and have given explicit precautions to return to the ER including for any other new or worsening symptoms. The patient understands and accepts the medical plan as it's been dictated and I have answered their questions. Discharge instructions concerning home care and prescriptions have been given. The patient is STABLE and is discharged to  home in good condition.   Final Clinical Impressions(s) / ED Diagnoses   Final diagnoses:  Left flank pain  Intermittent epigastric abdominal pain  Nephrolithiasis    New Prescriptions New Prescriptions   HYDROCODONE-ACETAMINOPHEN (NORCO) 5-325 MG TABLET    Take 1 tablet by mouth every 6 (six) hours as needed for severe pain.   NAPROXEN (NAPROSYN) 500 MG TABLET    Take 1 tablet (500 mg total) by mouth 2 (two) times daily as needed for mild pain, moderate pain or headache (TAKE WITH MEALS.).   ONDANSETRON (ZOFRAN ODT) 4 MG DISINTEGRATING TABLET    Take 1 tablet (4 mg total) by mouth every 8 (eight) hours as needed for nausea or vomiting.     Zacarias Pontes, PA-C 12/24/16 [place], MD 12/24/16 [phone]

## 2016-12-24 NOTE — ED Triage Notes (Signed)
Per pt, states left flank pain radiating to the back-slaso having abdominal pain

## 2016-12-24 NOTE — Discharge Instructions (Signed)
Take naprosyn as directed as needed for pain using norco for breakthrough pain. Do not drive or operate machinery with pain medication use. May need over-the-counter stool softener with this pain medication use. May consider using tums/maalox/zantac as needed for additional relief of epigastric abdominal pain. Use Zofran as needed for nausea. Strain all urine to try to catch the stone when it passes. Follow-up with your primary care doctor in 1 week for recheck of symptoms, and follow up with the urologist in the next 1 to 2 weeks for recheck of ongoing pain, however for intractable or uncontrollable pain at home then return to the [facility] emergency department.

## 2016-12-29 ENCOUNTER — Emergency Department (HOSPITAL_COMMUNITY)
Admission: EM | Admit: 2016-12-29 | Discharge: 2016-12-29 | Disposition: A | Discharge status: Home / Self Care | Payer: Self-pay | Attending: Emergency Medicine | Admitting: Emergency Medicine

## 2016-12-29 ENCOUNTER — Encounter (HOSPITAL_COMMUNITY): Payer: Self-pay

## 2016-12-29 ENCOUNTER — Emergency Department (HOSPITAL_COMMUNITY): Payer: Self-pay

## 2016-12-29 DIAGNOSIS — R1012 Left upper quadrant pain: Secondary | ICD-10-CM | POA: Insufficient documentation

## 2016-12-29 DIAGNOSIS — Z79899 Other long term (current) drug therapy: Secondary | ICD-10-CM | POA: Insufficient documentation

## 2016-12-29 DIAGNOSIS — F17210 Nicotine dependence, cigarettes, uncomplicated: Secondary | ICD-10-CM | POA: Insufficient documentation

## 2016-12-29 DIAGNOSIS — R109 Unspecified abdominal pain: Secondary | ICD-10-CM

## 2016-12-29 LAB — COMPREHENSIVE METABOLIC PANEL
ALT: 17 U/L (ref 14–54)
AST: 21 U/L (ref 15–41)
Albumin: 4 g/dL (ref 3.5–5.0)
Alkaline Phosphatase: 93 U/L (ref 38–126)
Anion gap: 7 (ref 5–15)
BILIRUBIN TOTAL: 0.3 mg/dL (ref 0.3–1.2)
BUN: 15 mg/dL (ref 6–20)
CO2: 28 mmol/L (ref 22–32)
Calcium: 9.2 mg/dL (ref 8.9–10.3)
Chloride: 106 mmol/L (ref 101–111)
Creatinine, Ser: 0.87 mg/dL (ref 0.44–1.00)
GFR calc Af Amer: >60 mL/min (ref >60)
GFR calc non Af Amer: >60 mL/min (ref >60)
Glucose, Bld: 118 mg/dL — ABNORMAL HIGH (ref 65–99)
Potassium: 4.1 mmol/L (ref 3.5–5.1)
SODIUM: 141 mmol/L (ref 135–145)
Total Protein: 6.8 g/dL (ref 6.5–8.1)

## 2016-12-29 LAB — CBC
HCT: 40.1 % (ref 36.0–46.0)
Hemoglobin: 13.1 g/dL (ref 12.0–15.0)
MCH: 27.7 pg (ref 26.0–34.0)
MCHC: 32.7 g/dL (ref 30.0–36.0)
MCV: 84.8 fL (ref 78.0–100.0)
Platelets: 399 10*3/uL (ref 150–400)
RBC: 4.73 MIL/uL (ref 3.87–5.11)
RDW: 13.9 % (ref 11.5–15.5)
WBC: 7.1 10*3/uL (ref 4.0–10.5)

## 2016-12-29 LAB — URINALYSIS, ROUTINE W REFLEX MICROSCOPIC
BILIRUBIN URINE: NEGATIVE
Glucose, UA: NEGATIVE mg/dL
Hgb urine dipstick: NEGATIVE
Ketones, ur: NEGATIVE mg/dL
LEUKOCYTES UA: NEGATIVE
Nitrite: NEGATIVE
PH: 6.5 (ref 5.0–8.0)
Protein, ur: NEGATIVE mg/dL
SPECIFIC GRAVITY, URINE: 1.025 (ref 1.005–1.030)

## 2016-12-29 LAB — LIPASE, BLOOD: Lipase: 38 U/L (ref 11–51)

## 2016-12-29 MED ORDER — FAMOTIDINE 20 MG PO TABS: 20.0000 mg | ORAL_TABLET | Freq: Two times a day (BID) | ORAL | 0 refills | Status: DC

## 2016-12-29 MED ORDER — SODIUM CHLORIDE 0.9 % IV BOLUS (SEPSIS)
500.0000 mL | Freq: Once | INTRAVENOUS | Status: AC
  Administered 2016-12-29: 500 mL via INTRAVENOUS

## 2016-12-29 MED ORDER — PANTOPRAZOLE SODIUM 40 MG IV SOLR
40.0000 mg | Freq: Once | INTRAVENOUS | Status: AC
  Administered 2016-12-29: 40 mg via INTRAVENOUS
  Filled 2016-12-29: qty 40

## 2016-12-29 MED ORDER — HYDROCODONE-ACETAMINOPHEN 5-325 MG PO TABS: 1.0000 | ORAL_TABLET | Freq: Four times a day (QID) | ORAL | 0 refills | Status: DC | PRN

## 2016-12-29 MED ORDER — PROMETHAZINE HCL 25 MG PO TABS: 25.0000 mg | ORAL_TABLET | Freq: Four times a day (QID) | ORAL | 0 refills | Status: DC | PRN

## 2016-12-29 MED ORDER — ONDANSETRON 4 MG PO TBDP
4.0000 mg | ORAL_TABLET | Freq: Once | ORAL | Status: AC
  Administered 2016-12-29: 4 mg via ORAL
  Filled 2016-12-29: qty 1

## 2016-12-29 MED ORDER — SODIUM CHLORIDE 0.9 % IV SOLN: INTRAVENOUS | Status: DC

## 2016-12-29 MED ORDER — HYDROMORPHONE HCL 1 MG/ML IJ SOLN
1.0000 mg | Freq: Once | INTRAMUSCULAR | Status: AC
  Administered 2016-12-29: 1 mg via INTRAVENOUS
  Filled 2016-12-29: qty 1

## 2016-12-29 MED ORDER — IOPAMIDOL (ISOVUE-300) INJECTION 61%
INTRAVENOUS | Status: AC
  Administered 2016-12-29: 100 mL via INTRAVENOUS
  Filled 2016-12-29: qty 100

## 2016-12-29 NOTE — ED Provider Notes (Signed)
[facility] DEPT Provider Note   CSN: [id number] Arrival date & time: 12/29/16  0749     History   Chief Complaint Chief Complaint  Patient presents with  . Abdominal Pain  . Flank Pain    HPI [facility] is a 46 y.o. female.  Patient seen worse same complaint January 2. Had CT renal stone study done at that time showing a renal calculus but no ureteral calculus. Patient states she's had these symptoms since before Christmas probably mid December. Patient's complaint is left flank pain left upper quadrant abdominal pain some left lateral chest wall pain. Some of the symptoms are made worse by movement. Some of the symptoms are made worse by eating. Patient has some nausea but no vomiting no fevers no diarrhea no history of similar problem.      Past Medical History:  Diagnosis Date  . Anxiety   . Depression   . Trichomonas infection     Patient Active Problem List   Diagnosis Date Noted  . Left arm weakness 12/06/2014  . Left arm pain 12/06/2014  . Paresthesias/numbness 12/06/2014  . Left hand weakness 11/14/2014  . Tobacco abuse 11/14/2014  . Hypokalemia 11/14/2014  . Depression   . Mixed incontinence 06/27/2014  . History of TVH in 2006 for fibroids and menorrhagia; benign pathology 09/08/2011    Past Surgical History:  Procedure Laterality Date  . CESAREAN SECTION    . VAGINAL HYSTERECTOMY  2006   Fibroids, menorrhagia, benign pathology    OB History    Gravida Para Term Preterm AB Living   5 3 3   2 3    SAB TAB Ectopic Multiple Live Births   2 0 0 0         Home Medications    Prior to Admission medications   Medication Sig Start Date End Date Taking? Authorizing Provider  HYDROcodone-acetaminophen (NORCO) 5-325 MG tablet Take 1 tablet by mouth every 6 (six) hours as needed for severe pain. 12/24/16  Yes Mercedes Camprubi-Soms, PA-C  naproxen (NAPROSYN) 500 MG tablet Take 1 tablet (500 mg total) by mouth 2 (two) times daily as needed for mild  pain, moderate pain or headache (TAKE WITH MEALS.). 12/24/16  Yes Mercedes Camprubi-Soms, PA-C  ondansetron (ZOFRAN ODT) 4 MG disintegrating tablet Take 1 tablet (4 mg total) by mouth every 8 (eight) hours as needed for nausea or vomiting. 12/24/16  Yes Mercedes Camprubi-Soms, PA-C  cyclobenzaprine (FLEXERIL) 10 MG tablet Take 1 tablet (10 mg total) by mouth 3 (three) times daily as needed for muscle spasms. Patient not taking: Reported on 12/29/2016 06/09/14   Hazel Sams, PA-C  famotidine (PEPCID) 20 MG tablet Take 1 tablet (20 mg total) by mouth 2 (two) times daily. 12/29/16   Fredia Sorrow, MD  HYDROcodone-acetaminophen (NORCO/VICODIN) 5-325 MG tablet Take 1-2 tablets by mouth every 6 (six) hours as needed for moderate pain. 12/29/16   Fredia Sorrow, MD  ibuprofen (ADVIL,MOTRIN) 600 MG tablet Take 1 tablet (600 mg total) by mouth every 6 (six) hours as needed. Patient not taking: Reported on 12/24/2016 03/24/15   Paticia Stack, PA-C  metroNIDAZOLE (FLAGYL) 500 MG tablet Take 1 tablet (500 mg total) by mouth 2 (two) times daily. Patient not taking: Reported on 12/24/2016 05/13/16   Osborne Oman, MD  nystatin (MYCOSTATIN) powder Apply topically 4 (four) times daily. Patient not taking: Reported on 12/24/2016 06/29/15   Tanna Savoy Stinson, DO  promethazine (PHENERGAN) 25 MG tablet Take 1 tablet (25 mg  total) by mouth every 6 (six) hours as needed for nausea or vomiting. 12/29/16   Fredia Sorrow, MD    Family History Family History  Problem Relation Age of Onset  . Diabetes Mother   . Mental illness Mother   . Depression Mother   . Hypertension Mother   . Hypertension Father   . Diabetes Father     Social History Social History  Substance Use Topics  . Smoking status: Current Every Day Smoker    Packs/day: 0.25    Years: 8.00    Types: Cigarettes  . Smokeless tobacco: Never Used  . Alcohol use Yes     Comment: occasional     Allergies   Aspirin   Review of Systems Review of Systems   Constitutional: Negative for fever.  HENT: Negative for congestion.   Eyes: Negative for visual disturbance.  Respiratory: Negative for shortness of breath.   Cardiovascular: Positive for chest pain.  Gastrointestinal: Positive for abdominal pain and nausea. Negative for vomiting.  Genitourinary: Negative for dysuria.  Musculoskeletal: Positive for back pain.  Skin: Negative for rash.  Neurological: Negative for headaches.  Hematological: Does not bruise/bleed easily.  Psychiatric/Behavioral: Negative for confusion.     Physical Exam Updated Vital Signs BP 118/72   Pulse 75   Temp 98.7 F (37.1 C) (Oral)   Resp 15   SpO2 100%   Physical Exam  Constitutional: She is oriented to person, place, and time. She appears well-developed and well-nourished. No distress.  HENT:  Head: Normocephalic and atraumatic.  Mouth/Throat: Oropharynx is clear and moist.  Eyes: EOM are normal. Pupils are equal, round, and reactive to light.  Neck: Normal range of motion.  Cardiovascular: Normal rate, regular rhythm and normal heart sounds.   Pulmonary/Chest: Effort normal and breath sounds normal. No respiratory distress. She exhibits tenderness.  Tender to palpation to left lateral lower ribs.  Abdominal: Soft. Bowel sounds are normal. There is tenderness.  Tender to palpation left flank left upper quadrant.  Musculoskeletal: Normal range of motion.  Neurological: She is alert and oriented to person, place, and time. No cranial nerve deficit or sensory deficit. She exhibits normal muscle tone. Coordination normal.  Skin: Skin is warm.  Nursing note and vitals reviewed.    ED Treatments / Results  Labs (all labs ordered are listed, but only abnormal results are displayed) Labs Reviewed  COMPREHENSIVE METABOLIC PANEL - Abnormal; Notable for the following:       Result Value   Glucose, Bld 118 (*)    All other components within normal limits  LIPASE, BLOOD  CBC  URINALYSIS, ROUTINE W  REFLEX MICROSCOPIC    EKG  EKG Interpretation None       Radiology Dg Ribs Unilateral W/chest Left  Result Date: 12/29/2016 CLINICAL DATA:  Persistent left-sided chest and flank pain, subsequent encounter EXAM: LEFT RIBS AND CHEST - 3+ VIEW COMPARISON:  12/24/2016, 08/10/2012 FINDINGS: No fracture or other bone lesions are seen involving the ribs. There is no evidence of pneumothorax or pleural effusion. Both lungs are clear. Heart size and mediastinal contours are within normal limits. IMPRESSION: No acute abnormality noted. Electronically Signed   By: Inez Catalina M.D.   On: 12/29/2016 08:59   Ct Abdomen Pelvis W Contrast  Result Date: 12/29/2016 CLINICAL DATA:  46 year old with a nonobstructive left renal calculus identified on CT examination performed 5 days ago. Persistent left flank pain which has now progressed to left-sided abdominal pain since that time. Patient denies nausea  and fever. Surgical history includes hysterectomy. EXAM: CT ABDOMEN AND PELVIS WITH CONTRAST TECHNIQUE: Multidetector CT imaging of the abdomen and pelvis was performed using the standard protocol following bolus administration of intravenous contrast. CONTRAST:  117mL ISOVUE-300 IOPAMIDOL INJECTION 61% IV. Oral contrast was not administered at the request of the ordering physician. COMPARISON:  12/24/2016, 09/15/2006. FINDINGS: Lower chest: Heart size normal.  Visualized lung bases clear. Hepatobiliary: Liver normal in size and appearance. Anatomic variant in that the left lobe extends well across the midline into the left upper quadrant. Gallbladder normal in appearance without calcified gallstones. No biliary ductal dilation. Pancreas: Normal in appearance without evidence of mass, ductal dilation, or inflammation. Spleen: Normal in size and appearance. Adrenals/Urinary Tract: Very small (2 mm) calculus in a lower pole calyx of the left kidney, unchanged since the examination 5 days ago, best seen on the coronal  reformatted images. No obstructing calculus on either side. No hydronephrosis. No focal parenchymal abnormality involving either kidney. Normal-appearing decompressed urinary bladder. Stomach/Bowel: Stomach decompressed and unremarkable. Normal-appearing small bowel. Normal-appearing colon with expected stool burden. Normal appendix in the right upper pelvis. Vascular/Lymphatic: No visible aortoiliofemoral atherosclerosis. Widely patent visceral arteries. Normal-appearing portal venous and systemic venous systems. No pathologic lymphadenopathy. Reproductive: Surgically absent uterus. No adnexal masses or free pelvic fluid. Normal-appearing ovaries by CT containing small follicular cysts. Other: None. Musculoskeletal: Lower thoracic spondylosis. No acute abnormalities. IMPRESSION: 1. Stable very small (approximate 2 mm) nonobstructing calculus in a lower pole calix of the left kidney since the examination 5 days ago. 2. No new/acute abnormalities involving the abdomen or pelvis. Electronically Signed   By: Evangeline Dakin M.D.   On: 12/29/2016 12:16    Procedures Procedures (including critical care time)  Medications Ordered in ED Medications  0.9 %  sodium chloride infusion ( Intravenous Rate/Dose Change 12/29/16 1241)  ondansetron (ZOFRAN-ODT) disintegrating tablet 4 mg (4 mg Oral Given 12/29/16 1112)  HYDROmorphone (DILAUDID) injection 1 mg (1 mg Intravenous Given 12/29/16 1117)  sodium chloride 0.9 % bolus 500 mL (0 mLs Intravenous Stopped 12/29/16 1241)  pantoprazole (PROTONIX) injection 40 mg (40 mg Intravenous Given 12/29/16 1118)  iopamidol (ISOVUE-300) 61 % injection (100 mLs Intravenous Contrast Given 12/29/16 1151)     Initial Impression / Assessment and Plan / ED Course  I have reviewed the triage vital signs and the nursing notes.  Pertinent labs & imaging results that were available during my care of the patient were reviewed by me and considered in my medical decision making (see chart for  details).  Clinical Course    Patient status post full workup for the left flank pain left upper quadrant pain and left lateral chest pain. No evidence of any rib fractures no evidence of any underlying pulmonary problems. CT of the abdomen and pelvis with contrast this time only shows the persistent left renal stone no evidence of any ureteral stone. Possible patient's symptoms could be related to peptic ulcer disease since it's made worse with eating. Will give a trial of Pepcid. We'll continue Phenergan and hydrocodone have her follow-up with her primary care doctor.   Final Clinical Impressions(s) / ED Diagnoses   Final diagnoses:  Left flank pain  Left upper quadrant pain    New Prescriptions New Prescriptions   FAMOTIDINE (PEPCID) 20 MG TABLET    Take 1 tablet (20 mg total) by mouth 2 (two) times daily.   HYDROCODONE-ACETAMINOPHEN (NORCO/VICODIN) 5-325 MG TABLET    Take 1-2 tablets by mouth every 6 (six)  hours as needed for moderate pain.   PROMETHAZINE (PHENERGAN) 25 MG TABLET    Take 1 tablet (25 mg total) by mouth every 6 (six) hours as needed for nausea or vomiting.     Fredia Sorrow, MD 12/29/16 1341

## 2016-12-29 NOTE — ED Triage Notes (Signed)
Pt here Tuesday with left flank pain, kidney stone.  Pt has had continual flank pain and now progressed to abdominal pain.  Pt states nausea, no fever.

## 2016-12-29 NOTE — Discharge Instructions (Signed)
Workup here today without any acute findings. As we discuss his possible your symptoms could be related to a stomach ulcer. Take the Pepcid as directed for the next 2 weeks. Make an appointment to follow-up with your record Dr. Megan Salon with Phenergan and hydrocodone as needed for nausea and pain control. Return for any new or worse symptoms. Work note provided.

## 2017-01-15 ENCOUNTER — Emergency Department (HOSPITAL_COMMUNITY)
Admission: EM | Admit: 2017-01-15 | Discharge: 2017-01-15 | Disposition: A | Discharge status: Home / Self Care | Payer: Medicaid Other | Attending: Emergency Medicine | Admitting: Emergency Medicine

## 2017-01-15 ENCOUNTER — Encounter (HOSPITAL_COMMUNITY): Payer: Self-pay

## 2017-01-15 ENCOUNTER — Emergency Department (HOSPITAL_COMMUNITY): Payer: Medicaid Other

## 2017-01-15 DIAGNOSIS — K2900 Acute gastritis without bleeding: Secondary | ICD-10-CM | POA: Insufficient documentation

## 2017-01-15 DIAGNOSIS — R109 Unspecified abdominal pain: Secondary | ICD-10-CM

## 2017-01-15 DIAGNOSIS — F17210 Nicotine dependence, cigarettes, uncomplicated: Secondary | ICD-10-CM | POA: Insufficient documentation

## 2017-01-15 DIAGNOSIS — R1013 Epigastric pain: Secondary | ICD-10-CM

## 2017-01-15 LAB — CBC WITH DIFFERENTIAL/PLATELET
Basophils Absolute: 0 10*3/uL (ref 0.0–0.1)
Basophils Relative: 0 %
Eosinophils Absolute: 0.2 10*3/uL (ref 0.0–0.7)
Eosinophils Relative: 3 %
HCT: 36.8 % (ref 36.0–46.0)
HEMOGLOBIN: 12.3 g/dL (ref 12.0–15.0)
LYMPHS PCT: 34 %
Lymphs Abs: 2.2 10*3/uL (ref 0.7–4.0)
MCH: 27.9 pg (ref 26.0–34.0)
MCHC: 33.4 g/dL (ref 30.0–36.0)
MCV: 83.4 fL (ref 78.0–100.0)
Monocytes Absolute: 0.6 10*3/uL (ref 0.1–1.0)
Monocytes Relative: 9 %
NEUTROS ABS: 3.5 10*3/uL (ref 1.7–7.7)
NEUTROS PCT: 54 %
Platelets: 343 10*3/uL (ref 150–400)
RBC: 4.41 MIL/uL (ref 3.87–5.11)
RDW: 13.8 % (ref 11.5–15.5)
WBC: 6.4 10*3/uL (ref 4.0–10.5)

## 2017-01-15 LAB — URINALYSIS, ROUTINE W REFLEX MICROSCOPIC
Bilirubin Urine: NEGATIVE
Glucose, UA: NEGATIVE mg/dL
Hgb urine dipstick: NEGATIVE
KETONES UR: NEGATIVE mg/dL
LEUKOCYTES UA: NEGATIVE
NITRITE: NEGATIVE
PH: 6 (ref 5.0–8.0)
Protein, ur: NEGATIVE mg/dL
SPECIFIC GRAVITY, URINE: 1.023 (ref 1.005–1.030)

## 2017-01-15 LAB — COMPREHENSIVE METABOLIC PANEL
ALT: 14 U/L (ref 14–54)
AST: 19 U/L (ref 15–41)
Albumin: 4.2 g/dL (ref 3.5–5.0)
Alkaline Phosphatase: 66 U/L (ref 38–126)
Anion gap: 5 (ref 5–15)
BUN: 9 mg/dL (ref 6–20)
CHLORIDE: 107 mmol/L (ref 101–111)
CO2: 29 mmol/L (ref 22–32)
Calcium: 9.2 mg/dL (ref 8.9–10.3)
Creatinine, Ser: 0.81 mg/dL (ref 0.44–1.00)
Glucose, Bld: 106 mg/dL — ABNORMAL HIGH (ref 65–99)
POTASSIUM: 3.7 mmol/L (ref 3.5–5.1)
Sodium: 141 mmol/L (ref 135–145)
Total Bilirubin: 0.4 mg/dL (ref 0.3–1.2)
Total Protein: 7.3 g/dL (ref 6.5–8.1)

## 2017-01-15 LAB — LIPASE, BLOOD: Lipase: 26 U/L (ref 11–51)

## 2017-01-15 LAB — POC URINE PREG, ED: Preg Test, Ur: NEGATIVE

## 2017-01-15 MED ORDER — OMEPRAZOLE 20 MG PO CPDR: 20.0000 mg | DELAYED_RELEASE_CAPSULE | Freq: Every day | ORAL | 0 refills | Status: DC

## 2017-01-15 NOTE — ED Notes (Signed)
Patient not able to provide urine sample at this time. 

## 2017-01-15 NOTE — ED Provider Notes (Signed)
[facility] Provider Note   CSN: [id number] Arrival date & time: 01/15/17  [id number]     History   Chief Complaint Chief Complaint  Patient presents with  . Abdominal Pain    HPI Whitney Benton is a 46 y.o. female.  The history is provided by the patient. No language interpreter was used.   Whitney Benton is a 46 y.o. female who presents to the Emergency Department complaining of abdominal pain.  She has experienced 3 weeks of intermittent abdominal pain. Pain is located across her upper abdomen and described as a cramping sensation. He occurs after eating "regular food". She states she is able to eat small snacks and ensure. She has associated nausea. No fevers, vomiting, diarrhea, constipation, dysuria, vaginal discharge. She was seen in the emergency department 3 weeks ago for similar symptoms and told she had a kidney stone and she was also told she had an ulcer was started on Pepcid. She has been taking the medication with no change in her symptoms. Past Medical History:  Diagnosis Date  . Anxiety   . Depression   . Trichomonas infection     Patient Active Problem List   Diagnosis Date Noted  . Left arm weakness 12/06/2014  . Left arm pain 12/06/2014  . Paresthesias/numbness 12/06/2014  . Left hand weakness 11/14/2014  . Tobacco abuse 11/14/2014  . Hypokalemia 11/14/2014  . Depression   . Mixed incontinence 06/27/2014  . History of TVH in 2006 for fibroids and menorrhagia; benign pathology 09/08/2011    Past Surgical History:  Procedure Laterality Date  . CESAREAN SECTION    . VAGINAL HYSTERECTOMY  2006   Fibroids, menorrhagia, benign pathology    OB History    Gravida Para Term Preterm AB Living   5 3 3   2 3    SAB TAB Ectopic Multiple Live Births   2 0 0 0         Home Medications    Prior to Admission medications   Medication Sig Start Date End Date Taking? Authorizing Provider  nystatin (MYCOSTATIN) powder Apply topically 4 (four) times  daily. Patient taking differently: Apply topically 4 (four) times daily as needed.  06/29/15  Yes Tanna Savoy Stinson, DO  promethazine (PHENERGAN) 25 MG tablet Take 1 tablet (25 mg total) by mouth every 6 (six) hours as needed for nausea or vomiting. 12/29/16  Yes Fredia Sorrow, MD  omeprazole (PRILOSEC) 20 MG capsule Take 1 capsule (20 mg total) by mouth daily. 01/15/17   Quintella Reichert, MD  ondansetron (ZOFRAN ODT) 4 MG disintegrating tablet Take 1 tablet (4 mg total) by mouth every 8 (eight) hours as needed for nausea or vomiting. Patient not taking: Reported on 01/15/2017 12/24/16   [place], PA-C    Family History Family History  Problem Relation Age of Onset  . Diabetes Mother   . Mental illness Mother   . Depression Mother   . Hypertension Mother   . Hypertension Father   . Diabetes Father     Social History Social History  Substance Use Topics  . Smoking status: Current Every Day Smoker    Packs/day: 0.25    Years: 8.00    Types: Cigarettes  . Smokeless tobacco: Never Used  . Alcohol use Yes     Comment: occasional     Allergies   Aspirin   Review of Systems Review of Systems  All other systems reviewed and are negative.    Physical Exam Updated Vital  Signs BP (!) 135/107 (BP Location: Left Arm)   Pulse 80   Temp 98.2 F (36.8 C) (Oral)   Resp 18   Ht 5\' 4"  (1.626 m)   Wt 209 lb (94.8 kg)   SpO2 100%   BMI 35.87 kg/m   Physical Exam  Constitutional: She is oriented to person, place, and time. She appears well-developed and well-nourished.  HENT:  Head: Normocephalic and atraumatic.  Cardiovascular: Normal rate and regular rhythm.   No murmur heard. Pulmonary/Chest: Effort normal and breath sounds normal. No respiratory distress.  Abdominal: Soft. There is no rebound and no guarding.  mild epigastric and right upper quadrant tenderness without guarding or rebound.  Musculoskeletal: She exhibits no edema or tenderness.  Neurological: She is  alert and oriented to person, place, and time.  Skin: Skin is warm and dry.  Psychiatric: She has a normal mood and affect. Her behavior is normal.  Nursing note and vitals reviewed.    ED Treatments / Results  Labs (all labs ordered are listed, but only abnormal results are displayed) Labs Reviewed  COMPREHENSIVE METABOLIC PANEL - Abnormal; Notable for the following:       Result Value   Glucose, Bld 106 (*)    All other components within normal limits  URINALYSIS, ROUTINE W REFLEX MICROSCOPIC - Abnormal; Notable for the following:    APPearance HAZY (*)    All other components within normal limits  CBC WITH DIFFERENTIAL/PLATELET  LIPASE, BLOOD  POC URINE PREG, ED    EKG  EKG Interpretation None       Radiology US Abdomen Limited Ruq  Result Date: 01/15/2017 CLINICAL DATA:  Right upper quadrant abdominal pain for 2 weeks, nausea. EXAM: US ABDOMEN LIMITED - RIGHT UPPER QUADRANT COMPARISON:  CT scan of December 29, 2016. FINDINGS: Gallbladder: No gallstones or wall thickening visualized. No sonographic Murphy sign noted by sonographer. Common bile duct: Diameter: 3.1 mm which is within normal limits. Liver: No focal lesion identified. Within normal limits in parenchymal echogenicity. IMPRESSION: No abnormality seen in the right upper quadrant of the abdomen. Electronically Signed   By: Marijo Conception, M.D.   On: 01/15/2017 09:16    Procedures Procedures (including critical care time)  Medications Ordered in ED Medications - No data to display   Initial Impression / Assessment and Plan / ED Course  I have reviewed the triage vital signs and the nursing notes.  Pertinent labs & imaging results that were available during my care of the patient were reviewed by me and considered in my medical decision making (see chart for details).     Patient here for evaluation of one month of episodic epigastric pain. Abdominal examination is benign. No evidence of cholecystitis.  Discussed the patient likely gastritis. Will change her Pepcid to omeprazole. Discussed GI follow-up, dietary changes, return precautions. Clinical picture is not consistent with perforated viscus, pancreatitis, bowel traction.  Final Clinical Impressions(s) / ED Diagnoses   Final diagnoses:  Abdominal pain  Epigastric abdominal pain  Acute gastritis without hemorrhage, unspecified gastritis type    New Prescriptions Discharge Medication List as of 01/15/2017 10:17 AM    START taking these medications   Details  omeprazole (PRILOSEC) 20 MG capsule Take 1 capsule (20 mg total) by mouth daily., Starting Wed 01/15/2017, Print         Quintella Reichert, MD 01/15/17 [phone]

## 2017-01-15 NOTE — ED Triage Notes (Signed)
She c/o on-going upper abd. Pain, not responding to acid blockers. She is in no distress.

## 2017-01-15 NOTE — ED Notes (Signed)
As I was performing triage; the ultrasonographer entered her room. I defer IV/lab work until u/s completed.

## 2017-01-15 NOTE — ED Notes (Signed)
Ultrasound at bedside

## 2017-02-24 ENCOUNTER — Ambulatory Visit (HOSPITAL_COMMUNITY)
Admission: EM | Admit: 2017-02-24 | Discharge: 2017-02-24 | Disposition: A | Discharge status: Home / Self Care | Payer: Medicaid Other | Attending: Internal Medicine | Admitting: Internal Medicine

## 2017-02-24 ENCOUNTER — Encounter (HOSPITAL_COMMUNITY): Payer: Self-pay | Admitting: Emergency Medicine

## 2017-02-24 DIAGNOSIS — M549 Dorsalgia, unspecified: Secondary | ICD-10-CM | POA: Insufficient documentation

## 2017-02-24 DIAGNOSIS — R109 Unspecified abdominal pain: Secondary | ICD-10-CM

## 2017-02-24 LAB — POCT URINALYSIS DIP (DEVICE)
BILIRUBIN URINE: NEGATIVE
Glucose, UA: NEGATIVE mg/dL
Ketones, ur: NEGATIVE mg/dL
NITRITE: NEGATIVE
PH: 7 (ref 5.0–8.0)
PROTEIN: NEGATIVE mg/dL
Specific Gravity, Urine: 1.02 (ref 1.005–1.030)
Urobilinogen, UA: 0.2 mg/dL (ref 0.0–1.0)

## 2017-02-24 MED ORDER — PHENAZOPYRIDINE HCL 200 MG PO TABS: 200.0000 mg | ORAL_TABLET | Freq: Three times a day (TID) | ORAL | 0 refills | Status: DC | PRN

## 2017-02-24 MED ORDER — ONDANSETRON 4 MG PO TBDP: 4.0000 mg | ORAL_TABLET | Freq: Three times a day (TID) | ORAL | 0 refills | Status: DC | PRN

## 2017-02-24 MED ORDER — KETOROLAC TROMETHAMINE 10 MG PO TABS: 10.0000 mg | ORAL_TABLET | Freq: Four times a day (QID) | ORAL | 0 refills | Status: DC | PRN

## 2017-02-24 NOTE — ED Triage Notes (Signed)
First time pain noticed was when patient was getting out of bed.  Pain in left mid-back and side.  Denies nausea, no vomiting, no pain with urination

## 2017-02-24 NOTE — ED Provider Notes (Signed)
CSN: [id number]     Arrival date & time 02/24/17  1320 History   None    Chief Complaint  Patient presents with  . Back Pain   (Consider location/radiation/quality/duration/timing/severity/associated sxs/prior Treatment) 46 year old female reports she woke up this morning with left-sided flank pain. Denies any history of trauma, she denies she is lifted anything heavy, she denies any urinary urgency, frequency, dysuria, fever, chills, has had some nausea. States her pain is worse with some movement and worse when she lays on her left side.   The history is provided by the patient.  Back Pain    Past Medical History:  Diagnosis Date  . Anxiety   . Depression   . Trichomonas infection    Past Surgical History:  Procedure Laterality Date  . CESAREAN SECTION    . VAGINAL HYSTERECTOMY  2006   Fibroids, menorrhagia, benign pathology   Family History  Problem Relation Age of Onset  . Diabetes Mother   . Mental illness Mother   . Depression Mother   . Hypertension Mother   . Hypertension Father   . Diabetes Father    Social History  Substance Use Topics  . Smoking status: Current Every Day Smoker    Packs/day: 0.25    Years: 8.00    Types: Cigarettes  . Smokeless tobacco: Never Used  . Alcohol use Yes     Comment: occasional   OB History    Gravida Para Term Preterm AB Living   5 3 3   2 3    SAB TAB Ectopic Multiple Live Births   2 0 0 0       Review of Systems  Reason unable to perform ROS: As covered in history of present illness.  Musculoskeletal: Positive for back pain.  All other systems reviewed and are negative.   Allergies  Aspirin  Home Medications   Prior to Admission medications   Medication Sig Start Date End Date Taking? Authorizing Provider  ketorolac (TORADOL) 10 MG tablet Take 1 tablet (10 mg total) by mouth every 6 (six) hours as needed. 02/24/17   Barnet Glasgow, NP  omeprazole (PRILOSEC) 20 MG capsule Take 1 capsule (20 mg total) by mouth  daily. 01/15/17   Quintella Reichert, MD  ondansetron (ZOFRAN ODT) 4 MG disintegrating tablet Take 1 tablet (4 mg total) by mouth every 8 (eight) hours as needed for nausea or vomiting. 02/24/17   Barnet Glasgow, NP  phenazopyridine (PYRIDIUM) 200 MG tablet Take 1 tablet (200 mg total) by mouth 3 (three) times daily as needed for pain. 02/24/17   Barnet Glasgow, NP  promethazine (PHENERGAN) 25 MG tablet Take 1 tablet (25 mg total) by mouth every 6 (six) hours as needed for nausea or vomiting. 12/29/16   Fredia Sorrow, MD   Meds Ordered and Administered this Visit  Medications - No data to display  BP 127/84 (BP Location: Right Arm) Comment (BP Location): large cuff  Pulse 81   Temp 98.6 F (37 C) (Oral)   Resp 18   SpO2 99%  No data found.   Physical Exam  Constitutional: She is oriented to person, place, and time. She appears well-developed and well-nourished. No distress.  Cardiovascular: Normal rate and regular rhythm.   Pulmonary/Chest: Effort normal and breath sounds normal.  Abdominal: Soft. Bowel sounds are normal. She exhibits no distension and no mass. There is no tenderness. There is CVA tenderness (Left-sided). There is no guarding.  Neurological: She is alert and oriented to person, place, and  time.  Skin: Skin is warm and dry. Capillary refill takes less than 2 seconds. She is not diaphoretic.  Psychiatric: She has a normal mood and affect.  Nursing note and vitals reviewed.   Urgent Care Course     Procedures (including critical care time)  Labs Review Labs Reviewed  POCT URINALYSIS DIP (DEVICE) - Abnormal; Notable for the following:       Result Value   Hgb urine dipstick SMALL (*)    Leukocytes, UA TRACE (*)    All other components within normal limits  URINE CULTURE    Imaging Review No results found.       MDM   1. Flank pain    Your urine sample does not clearly demonstrate a UTI. However, we will send your urine to culture to see if anything  grows. Treating your flank pain, as if you would have a kidney stone. Prescribed Toradol, take one tablet every 6 hours as needed for pain. Also prescribed Pyridium, take one tablet every 8 hours for 2 days. This medicine will discolor your urine. Also prescribed Zofran, for nausea as needed take 1 tablet under the tongue every 8 hours. She is her symptoms fail to improve in 4-5 days, follow up with her primary care provider return to clinic, or go to the emergency room.      Barnet Glasgow, NP 02/24/17 1446

## 2017-02-24 NOTE — Discharge Instructions (Signed)
Your urine sample does not clearly demonstrate a UTI. However, we will send your urine to culture to see if anything grows. Treating your flank pain, as if you would have a kidney stone. Prescribed Toradol, take one tablet every 6 hours as needed for pain. Also prescribed Pyridium, take one tablet every 8 hours for 2 days. This medicine will discolor your urine. Also prescribed Zofran, for nausea as needed take 1 tablet under the tongue every 8 hours. She is her symptoms fail to improve in 4-5 days, follow up with her primary care provider return to clinic, or go to the emergency room.

## 2017-02-25 LAB — URINE CULTURE

## 2017-02-26 ENCOUNTER — Encounter (HOSPITAL_COMMUNITY): Payer: Self-pay | Admitting: *Deleted

## 2017-02-26 ENCOUNTER — Ambulatory Visit (HOSPITAL_COMMUNITY)
Admission: EM | Admit: 2017-02-26 | Discharge: 2017-02-26 | Disposition: A | Discharge status: Home / Self Care | Payer: Medicaid Other | Attending: Internal Medicine | Admitting: Internal Medicine

## 2017-02-26 DIAGNOSIS — M545 Low back pain, unspecified: Secondary | ICD-10-CM

## 2017-02-26 DIAGNOSIS — R300 Dysuria: Secondary | ICD-10-CM | POA: Insufficient documentation

## 2017-02-26 DIAGNOSIS — R109 Unspecified abdominal pain: Secondary | ICD-10-CM | POA: Insufficient documentation

## 2017-02-26 DIAGNOSIS — N390 Urinary tract infection, site not specified: Secondary | ICD-10-CM

## 2017-02-26 LAB — POCT URINALYSIS DIP (DEVICE)
Bilirubin Urine: NEGATIVE
Glucose, UA: NEGATIVE mg/dL
HGB URINE DIPSTICK: NEGATIVE
Nitrite: NEGATIVE
PH: 7.5 (ref 5.0–8.0)
PROTEIN: NEGATIVE mg/dL
SPECIFIC GRAVITY, URINE: 1.015 (ref 1.005–1.030)
UROBILINOGEN UA: 1 mg/dL (ref 0.0–1.0)

## 2017-02-26 MED ORDER — CYCLOBENZAPRINE HCL 10 MG PO TABS: 10.0000 mg | ORAL_TABLET | Freq: Two times a day (BID) | ORAL | 0 refills | Status: DC | PRN

## 2017-02-26 MED ORDER — CEPHALEXIN 500 MG PO CAPS: 500.0000 mg | ORAL_CAPSULE | Freq: Four times a day (QID) | ORAL | 0 refills | Status: DC

## 2017-02-26 MED ORDER — PHENAZOPYRIDINE HCL 200 MG PO TABS: 200.0000 mg | ORAL_TABLET | Freq: Three times a day (TID) | ORAL | 0 refills | Status: DC | PRN

## 2017-02-26 NOTE — ED Triage Notes (Signed)
Pt  Seen   2  Days  Ago  For  Back  Pain  meds  Not  Helping      Pt  Ambulated  To  Exam room   With  Steady  Fluid  Gait  Appearing in no  Acute/  Severe   Distress    Denies   Any  Nausea   Vomiting    Or urinary  Symptoms

## 2017-02-26 NOTE — ED Provider Notes (Signed)
CSN: [id number]     Arrival date & time 02/26/17  1534 History   First MD Initiated Contact with Patient 02/26/17 1630     Chief Complaint  Patient presents with  . Back Pain   (Consider location/radiation/quality/duration/timing/severity/associated sxs/prior Treatment) 46 year old female presents to clinic with a chief complaint of left sided lower back and flank pain. The patient was evaluated 02/24/2017 for flank pain, and for dysuria. Her urinalysis did not clearly demonstrate a UTI at that time, she was treated with Pyridium, and Toradol for possible kidney stone. She returns to clinic today stating for the first 2 days her symptoms improved, however they worsened today. She is complaining of left-sided flank pain and pelvic pressure, along with some dysuria, and urgency. She denies any fever, chills, nausea, vomiting, diarrhea, chest pain, shortness of breath, weakness, dizziness, or other symptoms.   The history is provided by the patient.  Back Pain    Past Medical History:  Diagnosis Date  . Anxiety   . Depression   . Trichomonas infection    Past Surgical History:  Procedure Laterality Date  . CESAREAN SECTION    . VAGINAL HYSTERECTOMY  2006   Fibroids, menorrhagia, benign pathology   Family History  Problem Relation Age of Onset  . Diabetes Mother   . Mental illness Mother   . Depression Mother   . Hypertension Mother   . Hypertension Father   . Diabetes Father    Social History  Substance Use Topics  . Smoking status: Current Every Day Smoker    Packs/day: 0.25    Years: 8.00    Types: Cigarettes  . Smokeless tobacco: Never Used  . Alcohol use Yes     Comment: occasional   OB History    Gravida Para Term Preterm AB Living   5 3 3   2 3    SAB TAB Ectopic Multiple Live Births   2 0 0 0       Review of Systems  Reason unable to perform ROS: As covered in history of present illness.  Musculoskeletal: Positive for back pain.  All other systems reviewed  and are negative.   Allergies  Aspirin  Home Medications   Prior to Admission medications   Medication Sig Start Date End Date Taking? Authorizing Provider  cephALEXin (KEFLEX) 500 MG capsule Take 1 capsule (500 mg total) by mouth 4 (four) times daily. 02/26/17   Barnet Glasgow, NP  cyclobenzaprine (FLEXERIL) 10 MG tablet Take 1 tablet (10 mg total) by mouth 2 (two) times daily as needed for muscle spasms. 02/26/17   Barnet Glasgow, NP  ketorolac (TORADOL) 10 MG tablet Take 1 tablet (10 mg total) by mouth every 6 (six) hours as needed. 02/24/17   Barnet Glasgow, NP  omeprazole (PRILOSEC) 20 MG capsule Take 1 capsule (20 mg total) by mouth daily. 01/15/17   Quintella Reichert, MD  ondansetron (ZOFRAN ODT) 4 MG disintegrating tablet Take 1 tablet (4 mg total) by mouth every 8 (eight) hours as needed for nausea or vomiting. 02/24/17   Barnet Glasgow, NP  phenazopyridine (PYRIDIUM) 200 MG tablet Take 1 tablet (200 mg total) by mouth 3 (three) times daily as needed for pain. 02/26/17   Barnet Glasgow, NP  promethazine (PHENERGAN) 25 MG tablet Take 1 tablet (25 mg total) by mouth every 6 (six) hours as needed for nausea or vomiting. 12/29/16   Fredia Sorrow, MD   Meds Ordered and Administered this Visit  Medications - No data to display  BP 117/82 (BP Location: Right Arm)   Pulse 78   Temp 98.6 F (37 C) (Oral)   Resp 18   SpO2 100%  No data found.   Physical Exam  Constitutional: She is oriented to person, place, and time. She appears well-developed and well-nourished. No distress.  HENT:  Head: Normocephalic and atraumatic.  Right Ear: External ear normal.  Left Ear: External ear normal.  Neck: Normal range of motion. Neck supple.  Cardiovascular: Normal rate and regular rhythm.   Pulmonary/Chest: Effort normal and breath sounds normal.  Abdominal: Soft. Bowel sounds are normal. She exhibits no distension and no mass. There is no tenderness. There is CVA tenderness (left sided). There  is no guarding.  Musculoskeletal: She exhibits no edema or tenderness.  Neurological: She is alert and oriented to person, place, and time.  Skin: Skin is warm and dry. Capillary refill takes less than 2 seconds. She is not diaphoretic.  Psychiatric: She has a normal mood and affect. Her behavior is normal.  Nursing note and vitals reviewed.   Urgent Care Course     Procedures (including critical care time)  Labs Review Labs Reviewed  POCT URINALYSIS DIP (DEVICE) - Abnormal; Notable for the following:       Result Value   Ketones, ur TRACE (*)    Leukocytes, UA TRACE (*)    All other components within normal limits  URINE CULTURE    Imaging Review No results found.     MDM   1. Lower urinary tract infectious disease   2. Acute left-sided low back pain without sciatica    Repeat urinalysis collected, and urine sent for culture. Treatment for UTI initiated with Keflex 1 tablet 4 times a day for 5 days, and Pyridium was revealed. Also prescribed Flexeril, one tablet twice a day in case his underlying musculoskeletal problems. Advised patient to return to clinic in 3-5 days, if her symptoms do not resolve.     Barnet Glasgow, NP 02/26/17 [phone]

## 2017-02-26 NOTE — Discharge Instructions (Signed)
You are being treated today for a urinary tract infection. I have prescribed Keflex, take 1 tablet 4 times a day for 5 days. I have also prescribed Pyridium. Take 1 tablet a day 3 times a day for 2 days. Your urine will be sent for culture and you will be notified should any change in therapy be needed. For pain, I have prescribed a muscle relaxant, take flexeril, 1 tablet twice a day. This medicine will cause drowsiness, do not drink alcohol or drive while taking. Drink plenty of fluids and rest. Should your symptoms fail to resolve, follow up with your primary care provider or return to clinic.

## 2017-02-28 LAB — URINE CULTURE

## 2017-03-06 ENCOUNTER — Encounter (HOSPITAL_COMMUNITY): Payer: Self-pay | Admitting: Emergency Medicine

## 2017-03-06 ENCOUNTER — Ambulatory Visit (HOSPITAL_COMMUNITY)
Admission: EM | Admit: 2017-03-06 | Discharge: 2017-03-06 | Disposition: A | Discharge status: Home / Self Care | Payer: Medicaid Other | Attending: Family Medicine | Admitting: Family Medicine

## 2017-03-06 DIAGNOSIS — R1030 Lower abdominal pain, unspecified: Secondary | ICD-10-CM

## 2017-03-06 DIAGNOSIS — R109 Unspecified abdominal pain: Secondary | ICD-10-CM | POA: Insufficient documentation

## 2017-03-06 DIAGNOSIS — N390 Urinary tract infection, site not specified: Secondary | ICD-10-CM

## 2017-03-06 DIAGNOSIS — R319 Hematuria, unspecified: Secondary | ICD-10-CM | POA: Insufficient documentation

## 2017-03-06 DIAGNOSIS — Z3202 Encounter for pregnancy test, result negative: Secondary | ICD-10-CM

## 2017-03-06 LAB — POCT URINALYSIS DIP (DEVICE)
BILIRUBIN URINE: NEGATIVE
Glucose, UA: NEGATIVE mg/dL
Ketones, ur: NEGATIVE mg/dL
NITRITE: NEGATIVE
PH: 7 (ref 5.0–8.0)
Protein, ur: NEGATIVE mg/dL
Specific Gravity, Urine: 1.02 (ref 1.005–1.030)
Urobilinogen, UA: 1 mg/dL (ref 0.0–1.0)

## 2017-03-06 LAB — POCT PREGNANCY, URINE: Preg Test, Ur: NEGATIVE

## 2017-03-06 MED ORDER — NITROFURANTOIN MONOHYD MACRO 100 MG PO CAPS: 100.0000 mg | ORAL_CAPSULE | Freq: Two times a day (BID) | ORAL | 0 refills | Status: DC

## 2017-03-06 MED ORDER — KETOROLAC TROMETHAMINE 10 MG PO TABS: 10.0000 mg | ORAL_TABLET | Freq: Four times a day (QID) | ORAL | 0 refills | Status: DC | PRN

## 2017-03-06 NOTE — ED Triage Notes (Addendum)
Pt complains of left hip, leg, and buttocks pain since yesterday.  She denies any injury or fall.  Pt was treated 10 days ago for left lower back pain and then again 8 days ago for a kidney infection.  She has completed all medications and is still taking the Tramadol and Flexeril for pain, but she states that it is not helping and that this is a new pain.  Her other pain is now gone.

## 2017-03-06 NOTE — ED Provider Notes (Signed)
CSN: [id number]     Arrival date & time 03/06/17  1920 History   None    Chief Complaint  Patient presents with  . Hip Pain    left   (Consider location/radiation/quality/duration/timing/severity/associated sxs/prior Treatment) Patient c/o left abdominal pain and left back pain.  She states she has had some blood in her urine.     The history is provided by the patient.  Hip Pain  This is a new problem. The current episode started yesterday. The problem occurs constantly. The problem has not changed since onset.Associated symptoms include abdominal pain. Nothing aggravates the symptoms. Nothing relieves the symptoms. She has tried nothing for the symptoms.    Past Medical History:  Diagnosis Date  . Anxiety   . Depression   . Trichomonas infection    Past Surgical History:  Procedure Laterality Date  . CESAREAN SECTION    . VAGINAL HYSTERECTOMY  2006   Fibroids, menorrhagia, benign pathology   Family History  Problem Relation Age of Onset  . Diabetes Mother   . Mental illness Mother   . Depression Mother   . Hypertension Mother   . Hypertension Father   . Diabetes Father    Social History  Substance Use Topics  . Smoking status: Current Every Day Smoker    Packs/day: 0.25    Years: 8.00    Types: Cigarettes  . Smokeless tobacco: Never Used  . Alcohol use Yes     Comment: occasional   OB History    Gravida Para Term Preterm AB Living   5 3 3   2 3    SAB TAB Ectopic Multiple Live Births   2 0 0 0       Review of Systems  Constitutional: Negative.   HENT: Negative.   Eyes: Negative.   Respiratory: Negative.   Cardiovascular: Negative.   Gastrointestinal: Positive for abdominal pain.  Endocrine: Negative.   Genitourinary: Positive for hematuria.  Musculoskeletal: Positive for arthralgias.  Skin: Negative.   Allergic/Immunologic: Negative.   Neurological: Negative.   Hematological: Negative.   Psychiatric/Behavioral: Negative.     Allergies   Aspirin  Home Medications   Prior to Admission medications   Medication Sig Start Date End Date Taking? Authorizing Provider  cyclobenzaprine (FLEXERIL) 10 MG tablet Take 1 tablet (10 mg total) by mouth 2 (two) times daily as needed for muscle spasms. 02/26/17  Yes Barnet Glasgow, NP  phenazopyridine (PYRIDIUM) 200 MG tablet Take 1 tablet (200 mg total) by mouth 3 (three) times daily as needed for pain. 02/26/17  Yes Barnet Glasgow, NP  cephALEXin (KEFLEX) 500 MG capsule Take 1 capsule (500 mg total) by mouth 4 (four) times daily. 02/26/17   Barnet Glasgow, NP  ketorolac (TORADOL) 10 MG tablet Take 1 tablet (10 mg total) by mouth every 6 (six) hours as needed. 03/06/17   Lysbeth Penner, FNP  nitrofurantoin, macrocrystal-monohydrate, (MACROBID) 100 MG capsule Take 1 capsule (100 mg total) by mouth 2 (two) times daily. 03/06/17   Lysbeth Penner, FNP  omeprazole (PRILOSEC) 20 MG capsule Take 1 capsule (20 mg total) by mouth daily. 01/15/17   Quintella Reichert, MD  ondansetron (ZOFRAN ODT) 4 MG disintegrating tablet Take 1 tablet (4 mg total) by mouth every 8 (eight) hours as needed for nausea or vomiting. 02/24/17   Barnet Glasgow, NP  promethazine (PHENERGAN) 25 MG tablet Take 1 tablet (25 mg total) by mouth every 6 (six) hours as needed for nausea or vomiting. 12/29/16   Scott  Rogene Houston, MD   Meds Ordered and Administered this Visit  Medications - No data to display  BP (!) 141/89 (BP Location: Right Arm)   Pulse 92   Temp 98.8 F (37.1 C) (Oral)   SpO2 99%  No data found.   Physical Exam  Constitutional: She appears well-developed and well-nourished.  HENT:  Head: Normocephalic and atraumatic.  Right Ear: External ear normal.  Left Ear: External ear normal.  Mouth/Throat: Oropharynx is clear and moist.  Eyes: Conjunctivae and EOM are normal. Pupils are equal, round, and reactive to light.  Neck: Normal range of motion. Neck supple.  Cardiovascular: Normal rate, regular rhythm and  normal heart sounds.   Pulmonary/Chest: Effort normal and breath sounds normal.  Abdominal: There is tenderness.  Left lower abdominal discomfort no guarding and no rebound or peritoneal signs.  Genitourinary:  Genitourinary Comments: Positive Left CVA pain   Nursing note and vitals reviewed.   Urgent Care Course     Procedures (including critical care time)  Labs Review Labs Reviewed  POCT URINALYSIS DIP (DEVICE) - Abnormal; Notable for the following:       Result Value   Hgb urine dipstick TRACE (*)    Leukocytes, UA TRACE (*)    All other components within normal limits  POCT PREGNANCY, URINE    Imaging Review No results found.   Visual Acuity Review  Right Eye Distance:   Left Eye Distance:   Bilateral Distance:    Right Eye Near:   Left Eye Near:    Bilateral Near:         MDM   1. Left flank pain   2. Lower abdominal pain   3. Hematuria, unspecified type   4. Urinary tract infection with hematuria, site unspecified    Macrobid 100mg  one po bid x 7 days #14 Toradol 10mg  one po q 6 hours prn pain #20 UA cx  Urology Referral made      Lysbeth Penner, FNP 03/06/17 2052

## 2017-03-08 LAB — URINE CULTURE

## 2017-07-12 ENCOUNTER — Emergency Department (HOSPITAL_COMMUNITY)
Admission: EM | Admit: 2017-07-12 | Discharge: 2017-07-12 | Disposition: A | Discharge status: Home / Self Care | Payer: Medicaid Other | Attending: Emergency Medicine | Admitting: Emergency Medicine

## 2017-07-12 ENCOUNTER — Encounter (HOSPITAL_COMMUNITY): Payer: Self-pay | Admitting: Emergency Medicine

## 2017-07-12 DIAGNOSIS — R0789 Other chest pain: Secondary | ICD-10-CM

## 2017-07-12 DIAGNOSIS — Z79899 Other long term (current) drug therapy: Secondary | ICD-10-CM | POA: Insufficient documentation

## 2017-07-12 DIAGNOSIS — F32A Depression, unspecified: Secondary | ICD-10-CM

## 2017-07-12 DIAGNOSIS — F17210 Nicotine dependence, cigarettes, uncomplicated: Secondary | ICD-10-CM | POA: Insufficient documentation

## 2017-07-12 DIAGNOSIS — F329 Major depressive disorder, single episode, unspecified: Secondary | ICD-10-CM | POA: Insufficient documentation

## 2017-07-12 NOTE — Discharge Instructions (Signed)
There is no sign of cardiac or lung problems tonight.  For the discomfort you are having in your chest wall, take Tylenol every 4 hours for pain.  You  can also use a heating pad on the sore area 2 or 3 times a day to help with the discomfort.  Your blood pressure was mildly elevated today at 142/104.  Please have a doctor recheck it when you see them.  In the meantime, try to avoid salt, caffeine, and get some light exercise.  Follow-up with a primary care doctor for checkup in 2-3 weeks, sooner if needed for problems.

## 2017-07-12 NOTE — ED Triage Notes (Signed)
PT reports feeling chest pain along with possible anxiety attack. Pt states pain started around 1600 today and became worse at 1800. Pt stated that she was having shortness of breath and dizziness at that time.

## 2017-07-12 NOTE — ED Provider Notes (Signed)
[facility] DEPT Provider Note   CSN: [id number] Arrival date & time: 07/12/17  1958     History   Chief Complaint Chief Complaint  Patient presents with  . Chest Pain  . Anxiety    HPI Whitney Benton is a 46 y.o. female.  She presents for evaluation of chest pain which started at 4 PM today, spontaneously.  No associated diaphoresis, nausea, vomiting, weakness or dizziness.  No prior similar problems.  She denies stress.  She feels like her depression is not being adequately managed because she was changed to Zoloft, from Cymbalta.  She denies suicidal ideation.  There are no other known modifying factors.  HPI  Past Medical History:  Diagnosis Date  . Anxiety   . Depression   . Trichomonas infection     Patient Active Problem List   Diagnosis Date Noted  . Left arm weakness 12/06/2014  . Left arm pain 12/06/2014  . Paresthesias/numbness 12/06/2014  . Left hand weakness 11/14/2014  . Tobacco abuse 11/14/2014  . Hypokalemia 11/14/2014  . Depression   . Mixed incontinence 06/27/2014  . History of TVH in 2006 for fibroids and menorrhagia; benign pathology 09/08/2011    Past Surgical History:  Procedure Laterality Date  . CESAREAN SECTION    . VAGINAL HYSTERECTOMY  2006   Fibroids, menorrhagia, benign pathology    OB History    Gravida Para Term Preterm AB Living   5 3 3   2 3    SAB TAB Ectopic Multiple Live Births   2 0 0 0         Home Medications    Prior to Admission medications   Medication Sig Start Date End Date Taking? Authorizing Provider  cephALEXin (KEFLEX) 500 MG capsule Take 1 capsule (500 mg total) by mouth 4 (four) times daily. 02/26/17   Barnet Glasgow, NP  cyclobenzaprine (FLEXERIL) 10 MG tablet Take 1 tablet (10 mg total) by mouth 2 (two) times daily as needed for muscle spasms. 02/26/17   Barnet Glasgow, NP  ketorolac (TORADOL) 10 MG tablet Take 1 tablet (10 mg total) by mouth every 6 (six) hours as needed. 03/06/17   Lysbeth Penner, FNP  nitrofurantoin, macrocrystal-monohydrate, (MACROBID) 100 MG capsule Take 1 capsule (100 mg total) by mouth 2 (two) times daily. 03/06/17   Lysbeth Penner, FNP  omeprazole (PRILOSEC) 20 MG capsule Take 1 capsule (20 mg total) by mouth daily. 01/15/17   Quintella Reichert, MD  ondansetron (ZOFRAN ODT) 4 MG disintegrating tablet Take 1 tablet (4 mg total) by mouth every 8 (eight) hours as needed for nausea or vomiting. 02/24/17   Barnet Glasgow, NP  phenazopyridine (PYRIDIUM) 200 MG tablet Take 1 tablet (200 mg total) by mouth 3 (three) times daily as needed for pain. 02/26/17   Barnet Glasgow, NP  promethazine (PHENERGAN) 25 MG tablet Take 1 tablet (25 mg total) by mouth every 6 (six) hours as needed for nausea or vomiting. 12/29/16   Fredia Sorrow, MD    Family History Family History  Problem Relation Age of Onset  . Diabetes Mother   . Mental illness Mother   . Depression Mother   . Hypertension Mother   . Hypertension Father   . Diabetes Father     Social History Social History  Substance Use Topics  . Smoking status: Current Every Day Smoker    Packs/day: 0.25    Years: 8.00    Types: Cigarettes  . Smokeless tobacco: Never Used  . Alcohol  use Yes     Comment: occasional     Allergies   Aspirin   Review of Systems Review of Systems  All other systems reviewed and are negative.    Physical Exam Updated Vital Signs BP (!) 142/104 (BP Location: Right Arm)   Pulse (!) 113   Temp 98.7 F (37.1 C) (Oral)   Resp 16   Ht 5\' 3"  (1.6 m)   Wt 90.3 kg (199 lb)   SpO2 100%   BMI 35.25 kg/m   Physical Exam  Constitutional: She is oriented to person, place, and time. She appears well-developed and well-nourished.  HENT:  Head: Normocephalic and atraumatic.  Eyes: Pupils are equal, round, and reactive to light. Conjunctivae and EOM are normal.  Neck: Normal range of motion and phonation normal. Neck supple.  Cardiovascular: Normal rate and regular rhythm.     Pulmonary/Chest: Effort normal and breath sounds normal. She exhibits tenderness (Left anterior chest wall tenderness without deformity or crepitation).  Abdominal: Soft. She exhibits no distension. There is no tenderness. There is no guarding.  Musculoskeletal: Normal range of motion.  Neurological: She is alert and oriented to person, place, and time. She exhibits normal muscle tone.  Skin: Skin is warm and dry.  Psychiatric: She has a normal mood and affect. Her behavior is normal. Judgment and thought content normal.  Nursing note and vitals reviewed.    ED Treatments / Results  Labs (all labs ordered are listed, but only abnormal results are displayed) Labs Reviewed - No data to display  EKG  EKG Interpretation  Date/Time:  Saturday July 12 2017 20:14:46 EDT Ventricular Rate:  111 PR Interval:    QRS Duration: 83 QT Interval:  328 QTC Calculation: 446 R Axis:   -2 Text Interpretation:  Sinus tachycardia Anteroseptal infarct, old Since last tracing rate faster Reconfirmed by Daleen Bo [phone]) on 07/12/2017 8:46:53 PM       Radiology No results found.  Procedures Procedures (including critical care time)  Medications Ordered in ED Medications - No data to display   Initial Impression / Assessment and Plan / ED Course  I have reviewed the triage vital signs and the nursing notes.  Pertinent labs & imaging results that were available during my care of the patient were reviewed by me and considered in my medical decision making (see chart for details).      Patient Vitals for the past 24 hrs:  BP Temp Temp src Pulse Resp SpO2 Height Weight  07/12/17 2009 (!) 142/104 98.7 F (37.1 C) Oral (!) 113 16 100 % 5\' 3"  (1.6 m) 90.3 kg (199 lb)    8:59 PM Reevaluation with update and discussion. After initial assessment and treatment, an updated evaluation reveals no change in clinical status.  Findings discussed with the patient, and all questions were answered.  Sharifah Champine L    Final Clinical Impressions(s) / ED Diagnoses   Final diagnoses:  Chest wall pain  Depression, unspecified depression type   Nonspecific chest wall pain, with reassuring examination.  Mildly elevated blood pressure.  No indication for hypertensive urgency clinical examination.  Doubt ACS, PE or pneumonia.  Nursing Notes Reviewed/ Care Coordinated Applicable Imaging Reviewed Interpretation of Laboratory Data incorporated into ED treatment  The patient appears reasonably screened and/or stabilized for discharge and I doubt any other medical condition or other [facility] requiring further screening, evaluation, or treatment in the ED at this time prior to discharge.  Plan: Home Medications-OTC analgesia; Home Treatments-low-salt diet, rest,  light exercise; return here if the recommended treatment, does not improve the symptoms; Recommended follow up-PCP checkup 1-2 weeks and as needed    New Prescriptions New Prescriptions   No medications on file     Daleen Bo, MD 07/12/17 2103

## 2017-09-16 ENCOUNTER — Ambulatory Visit (HOSPITAL_COMMUNITY)
Admission: EM | Admit: 2017-09-16 | Discharge: 2017-09-16 | Disposition: A | Discharge status: Home / Self Care | Payer: Self-pay | Attending: Family Medicine | Admitting: Family Medicine

## 2017-09-16 ENCOUNTER — Encounter (HOSPITAL_COMMUNITY): Payer: Self-pay | Admitting: Emergency Medicine

## 2017-09-16 DIAGNOSIS — R079 Chest pain, unspecified: Secondary | ICD-10-CM

## 2017-09-16 DIAGNOSIS — R0789 Other chest pain: Secondary | ICD-10-CM

## 2017-09-16 NOTE — ED Provider Notes (Signed)
[facility]    CSN: [id number] Arrival date & time: 09/16/17  1449     History   Chief Complaint Chief Complaint  Patient presents with  . Chest Pain    HPI Whitney Benton is a 46 y.o. female.   46 year old with history of anxiety, depression comes in for 1 day history of chest pain starting midnight that woke her up. She states she experiences left sided chest pain, and the pain can be sharp or dull. It has been going off and on all day since onset. Can last for a few mins each time, has not noticed any aggravating or alleviating factor. States has some shortness of breath when pain onset. Has also had some weakness, dizziness. Denies swelling of the legs. Denies palpitations. States had trouble sleeping at night due to the pain, but felt mor comfortable after adding 3 pillows on top of the normal one. Walks on a regular bases for 1 hour, has not noticed dyspnea on exertion, angina. Positive family history of heart disease and diabetes. Current every day smoker. Denies history of clots, DVT, PE.       Past Medical History:  Diagnosis Date  . Anxiety   . Depression   . Trichomonas infection     Patient Active Problem List   Diagnosis Date Noted  . Left arm weakness 12/06/2014  . Left arm pain 12/06/2014  . Paresthesias/numbness 12/06/2014  . Left hand weakness 11/14/2014  . Tobacco abuse 11/14/2014  . Hypokalemia 11/14/2014  . Depression   . Mixed incontinence 06/27/2014  . History of TVH in 2006 for fibroids and menorrhagia; benign pathology 09/08/2011    Past Surgical History:  Procedure Laterality Date  . CESAREAN SECTION    . VAGINAL HYSTERECTOMY  2006   Fibroids, menorrhagia, benign pathology    OB History    Gravida Para Term Preterm AB Living   5 3 3   2 3    SAB TAB Ectopic Multiple Live Births   2 0 0 0         Home Medications    Prior to Admission medications   Medication Sig Start Date End Date Taking? Authorizing Provider    cephALEXin (KEFLEX) 500 MG capsule Take 1 capsule (500 mg total) by mouth 4 (four) times daily. 02/26/17   Barnet Glasgow, NP  cyclobenzaprine (FLEXERIL) 10 MG tablet Take 1 tablet (10 mg total) by mouth 2 (two) times daily as needed for muscle spasms. 02/26/17   Barnet Glasgow, NP  ketorolac (TORADOL) 10 MG tablet Take 1 tablet (10 mg total) by mouth every 6 (six) hours as needed. 03/06/17   Lysbeth Penner, FNP  nitrofurantoin, macrocrystal-monohydrate, (MACROBID) 100 MG capsule Take 1 capsule (100 mg total) by mouth 2 (two) times daily. 03/06/17   Lysbeth Penner, FNP  omeprazole (PRILOSEC) 20 MG capsule Take 1 capsule (20 mg total) by mouth daily. 01/15/17   Quintella Reichert, MD  ondansetron (ZOFRAN ODT) 4 MG disintegrating tablet Take 1 tablet (4 mg total) by mouth every 8 (eight) hours as needed for nausea or vomiting. 02/24/17   Barnet Glasgow, NP  phenazopyridine (PYRIDIUM) 200 MG tablet Take 1 tablet (200 mg total) by mouth 3 (three) times daily as needed for pain. 02/26/17   Barnet Glasgow, NP  promethazine (PHENERGAN) 25 MG tablet Take 1 tablet (25 mg total) by mouth every 6 (six) hours as needed for nausea or vomiting. 12/29/16   Fredia Sorrow, MD    Family  History Family History  Problem Relation Age of Onset  . Diabetes Mother   . Mental illness Mother   . Depression Mother   . Hypertension Mother   . Hypertension Father   . Diabetes Father     Social History Social History  Substance Use Topics  . Smoking status: Current Every Day Smoker    Packs/day: 0.25    Years: 8.00    Types: Cigarettes  . Smokeless tobacco: Never Used  . Alcohol use Yes     Comment: occasional     Allergies   Aspirin   Review of Systems Review of Systems  Reason unable to perform ROS: See HPI as above.     Physical Exam Triage Vital Signs ED Triage Vitals [09/16/17 1554]  Enc Vitals Group     BP 135/82     Pulse Rate 76     Resp 16     Temp 98.3 F (36.8 C)     Temp  Source Oral     SpO2 100 %     Weight 197 lb (89.4 kg)     Height 5\' 3"  (1.6 m)     Head Circumference      Peak Flow      Pain Score 7     Pain Loc      Pain Edu?      Excl. in [place]?    No data found.   Updated Vital Signs BP 135/82   Pulse 76   Temp 98.3 F (36.8 C) (Oral)   Resp 16   Ht 5\' 3"  (1.6 m)   Wt 197 lb (89.4 kg)   SpO2 100%   BMI 34.90 kg/m    Physical Exam  Constitutional: She is oriented to person, place, and time. She appears well-developed and well-nourished. No distress.  HENT:  Head: Normocephalic and atraumatic.  Eyes: Pupils are equal, round, and reactive to light. Conjunctivae and EOM are normal.  Neck: Normal range of motion. Neck supple.  Cardiovascular: Normal rate, regular rhythm and normal heart sounds.  Exam reveals no gallop and no friction rub.   No murmur heard. Pulmonary/Chest: Effort normal and breath sounds normal. She has no wheezes. She has no rales. She exhibits tenderness (tenderness on palpation of left chest,).  Musculoskeletal: She exhibits no edema.  Neurological: She is alert and oriented to person, place, and time.     UC Treatments / Results  Labs (all labs ordered are listed, but only abnormal results are displayed) Labs Reviewed - No data to display  EKG  EKG Interpretation None       Radiology No results found.  Procedures Procedures (including critical care time)  Medications Ordered in UC Medications - No data to display   Initial Impression / Assessment and Plan / UC Course  I have reviewed the triage vital signs and the nursing notes.  Pertinent labs & imaging results that were available during my care of the patient were reviewed by me and considered in my medical decision making (see chart for details).    EKG NSR, 67 bpm without acute changes. Discussed case with Dr Joseph Art, despite tenderness on palpation of chest wall, given chest pain with shortness of breath/dizziness/weakness, and is  current everyday smoker, will discharge patient to ED for further evaluation/rule out PE/cardiac causes of symptoms. Patient discharged in stable condition to ED for further evaluation.   Final Clinical Impressions(s) / UC Diagnoses   Final diagnoses:  Chest pain, unspecified type  New Prescriptions Discharge Medication List as of 09/16/2017  5:11 PM        Arturo Morton 09/16/17 2057    Cathlean Sauer V, PA-C 09/16/17 2100

## 2017-09-16 NOTE — ED Triage Notes (Signed)
PT reports chest pain that woke her up last night. PT has no cardiac history.

## 2017-09-16 NOTE — Discharge Instructions (Signed)
Given chest pain with shortness of breath, dizziness, with history of smoking, cannot rule out clot in lung, please go to the emergency department for further evaluation.

## 2017-12-05 ENCOUNTER — Emergency Department (EMERGENCY_DEPARTMENT_HOSPITAL): Payer: BLUE CROSS/BLUE SHIELD

## 2017-12-05 ENCOUNTER — Emergency Department
Admission: EM | Admit: 2017-12-05 | Discharge: 2017-12-06 | Discharge status: Against Medical Advice | Payer: BLUE CROSS/BLUE SHIELD | Attending: Pediatric Emergency Medicine | Admitting: Pediatric Emergency Medicine

## 2017-12-05 DIAGNOSIS — I4581 Long QT syndrome: Secondary | ICD-10-CM

## 2017-12-05 DIAGNOSIS — M79661 Pain in right lower leg: Principal | ICD-10-CM | POA: Insufficient documentation

## 2017-12-05 DIAGNOSIS — R079 Chest pain, unspecified: Secondary | ICD-10-CM

## 2017-12-05 DIAGNOSIS — M7989 Other specified soft tissue disorders: Secondary | ICD-10-CM

## 2017-12-05 DIAGNOSIS — R0602 Shortness of breath: Secondary | ICD-10-CM

## 2017-12-05 DIAGNOSIS — R9431 Abnormal electrocardiogram [ECG] [EKG]: Secondary | ICD-10-CM

## 2017-12-05 LAB — BASIC METABOLIC PANEL
CALCIUM: 9.4 mg/dL (ref 8.6–10.5)
CARBON DIOXIDE TOTAL: 25 mmol/L (ref 24–32)
CHLORIDE: 98 mmol/L (ref 95–110)
CREATININE BLOOD: 0.66 mg/dL (ref 0.44–1.27)
Glucose: 85 mg/dL (ref 70–99)
POTASSIUM: 3.4 mmol/L (ref 3.3–5.0)
SODIUM: 136 mmol/L (ref 135–145)
UREA NITROGEN, BLOOD (BUN): 8 mg/dL (ref 8–22)

## 2017-12-05 LAB — CBC WITH DIFFERENTIAL
BASOPHILS % AUTO: 0.6 %
BASOPHILS ABS AUTO: 0 10*3/uL (ref 0.0–0.2)
EOSINOPHIL % AUTO: 1.8 %
EOSINOPHIL ABS AUTO: 0.1 10*3/uL (ref 0.0–0.5)
HEMATOCRIT: 34.8 % (ref 34.0–46.0)
HEMOGLOBIN: 11.6 g/dL — AB (ref 12.0–16.0)
LYMPHOCYTE ABS AUTO: 3 10*3/uL (ref 1.0–4.8)
LYMPHOCYTES % AUTO: 48.3 %
MCH: 27.9 pg (ref 27.0–33.0)
MCHC: 33.2 % (ref 32.0–36.0)
MCV: 83.9 fL (ref 80.0–100.0)
MONOCYTES % AUTO: 5 %
MONOCYTES ABS AUTO: 0.3 10*3/uL (ref 0.1–0.8)
MPV: 8.2 fL (ref 6.8–10.0)
NEUTROPHIL ABS AUTO: 2.8 10*3/uL (ref 1.8–7.7)
NEUTROPHILS % AUTO: 44.3 %
Platelet Count: 333 10*3/uL (ref 130–400)
RDW: 13.7 % (ref 0.0–14.7)
RED CELL COUNT: 4.14 10*6/uL (ref 3.70–5.50)
WHITE BLOOD CELL COUNT: 6.2 10*3/uL (ref 4.5–11.0)

## 2017-12-05 LAB — TROPONIN T

## 2017-12-05 NOTE — ED Triage Note (Signed)
Patient presents to the ED with cc of right knee and right calf pain x 1 week, travelled by air a week ago. Had cp and SOB yesterday and day before but not now.  Referred by PMD to eval r/o DVT. ttp right calf, - homan's sign. csm wnl.       pmh hysterectomy, htn, arthritis, depression

## 2017-12-05 NOTE — ED Nursing Note (Signed)
N/A in WR  After multiple attempts

## 2017-12-05 NOTE — ED Nursing Note (Signed)
RLE pain continues awaiitng [place]

## 2017-12-05 NOTE — ED Progress Note (Signed)
Emergency Department Triage Note     Subjective: Rebecca Bailey is a [age]yr old female who presents to the ED with a chief complaint of RLE pain and swelling.     Patient's phone number confirmed - snapshot.     Objective (pertinent exam findings):    BP (!) 146/95   Pulse 77   Temp 36.6 C (97.9 F) (Oral)   Resp 18   Ht 1.626 m (5\' 4" )   Wt 109.4 kg (241 lb 2.9 oz)   LMP  (Approximate)   SpO2 98%   BMI 41.40 kg/m      General: No acute distress, ambulatory.    Assessment: Rebecca Bailey is a [age]yr old female with leg pain.      Plan: Initial studies to evaluate this problem will include imaging. Further workup will then proceed in patient care area B/C/D. ?    Seen and initially evaluated by:  Scherrie MerrittsKendra Leigh Grether-Jones, MD, Attending Physician, Department of Emergency Medicine       This patient was seen by a physician in triage. Please see the ED Initial Note for full details of this patient's care. This note covers the brief initial evaluation performed to obtain initial studies to expedite the patient's care. It is not intended to be a comprehensive document of the patient's ED visit.    This patient was instructed that this preliminary assessment and any studies obtained do not constitute a full workup of the patient's chief complaint. The patient was instructed to wait in the assigned area after the triage evaluation to be reevaluated by a medical provider after initial studies are completed.

## 2017-12-08 LAB — ELECTROCARDIOGRAM WITH RHYTHM STRIP: QTC: 461

## 2017-12-18 ENCOUNTER — Encounter (HOSPITAL_COMMUNITY): Payer: Self-pay

## 2017-12-18 ENCOUNTER — Other Ambulatory Visit: Payer: Self-pay

## 2017-12-18 ENCOUNTER — Emergency Department (HOSPITAL_COMMUNITY): Payer: Self-pay

## 2017-12-18 ENCOUNTER — Emergency Department (HOSPITAL_COMMUNITY)
Admission: EM | Admit: 2017-12-18 | Discharge: 2017-12-19 | Disposition: A | Discharge status: Home / Self Care | Payer: Self-pay | Attending: Emergency Medicine | Admitting: Emergency Medicine

## 2017-12-18 DIAGNOSIS — R197 Diarrhea, unspecified: Secondary | ICD-10-CM | POA: Insufficient documentation

## 2017-12-18 DIAGNOSIS — R112 Nausea with vomiting, unspecified: Secondary | ICD-10-CM | POA: Insufficient documentation

## 2017-12-18 DIAGNOSIS — F17210 Nicotine dependence, cigarettes, uncomplicated: Secondary | ICD-10-CM | POA: Insufficient documentation

## 2017-12-18 DIAGNOSIS — R1084 Generalized abdominal pain: Secondary | ICD-10-CM | POA: Insufficient documentation

## 2017-12-18 LAB — URINALYSIS, ROUTINE W REFLEX MICROSCOPIC
Bilirubin Urine: NEGATIVE
GLUCOSE, UA: NEGATIVE mg/dL
Ketones, ur: 20 mg/dL — AB
NITRITE: NEGATIVE
Protein, ur: NEGATIVE mg/dL
SPECIFIC GRAVITY, URINE: 1.026 (ref 1.005–1.030)
pH: 5 (ref 5.0–8.0)

## 2017-12-18 LAB — COMPREHENSIVE METABOLIC PANEL
ALBUMIN: 3.7 g/dL (ref 3.5–5.0)
ALK PHOS: 61 U/L (ref 38–126)
ALT: 18 U/L (ref 14–54)
ANION GAP: 7 (ref 5–15)
AST: 24 U/L (ref 15–41)
BILIRUBIN TOTAL: 0.8 mg/dL (ref 0.3–1.2)
BUN: 8 mg/dL (ref 6–20)
CALCIUM: 8.7 mg/dL — AB (ref 8.9–10.3)
CO2: 27 mmol/L (ref 22–32)
Chloride: 105 mmol/L (ref 101–111)
Creatinine, Ser: 0.89 mg/dL (ref 0.44–1.00)
GFR calc Af Amer: >60 mL/min (ref >60)
GLUCOSE: 138 mg/dL — AB (ref 65–99)
Potassium: 3.8 mmol/L (ref 3.5–5.1)
Sodium: 139 mmol/L (ref 135–145)
TOTAL PROTEIN: 6.8 g/dL (ref 6.5–8.1)

## 2017-12-18 LAB — CBC
HCT: 45.3 % (ref 36.0–46.0)
HEMOGLOBIN: 15.2 g/dL — AB (ref 12.0–15.0)
MCH: 28.9 pg (ref 26.0–34.0)
MCHC: 33.6 g/dL (ref 30.0–36.0)
MCV: 86.1 fL (ref 78.0–100.0)
Platelets: 420 10*3/uL — ABNORMAL HIGH (ref 150–400)
RBC: 5.26 MIL/uL — ABNORMAL HIGH (ref 3.87–5.11)
RDW: 13.8 % (ref 11.5–15.5)
WBC: 11.7 10*3/uL — AB (ref 4.0–10.5)

## 2017-12-18 LAB — LIPASE, BLOOD: Lipase: 21 U/L (ref 11–51)

## 2017-12-18 MED ORDER — DICYCLOMINE HCL 10 MG PO CAPS
10.0000 mg | ORAL_CAPSULE | Freq: Once | ORAL | Status: AC
  Administered 2017-12-18: 10 mg via ORAL
  Filled 2017-12-18: qty 1

## 2017-12-18 MED ORDER — ONDANSETRON 4 MG PO TBDP
4.0000 mg | ORAL_TABLET | Freq: Once | ORAL | Status: AC | PRN
  Administered 2017-12-18: 4 mg via ORAL
  Filled 2017-12-18: qty 1

## 2017-12-18 MED ORDER — SODIUM CHLORIDE 0.9 % IV BOLUS (SEPSIS)
1000.0000 mL | Freq: Once | INTRAVENOUS | Status: AC
  Administered 2017-12-18: 1000 mL via INTRAVENOUS

## 2017-12-18 MED ORDER — ONDANSETRON HCL 4 MG/2ML IJ SOLN
4.0000 mg | Freq: Once | INTRAMUSCULAR | Status: AC
  Administered 2017-12-18: 4 mg via INTRAVENOUS
  Filled 2017-12-18: qty 2

## 2017-12-18 NOTE — ED Provider Notes (Signed)
[facility] DEPT Provider Note   CSN: [id number] Arrival date & time: 12/18/17  1305     History   Chief Complaint Chief Complaint  Patient presents with  . Abdominal Pain    HPI Whitney Benton is a 46 y.o. female with a hx of anxiety, depression presents to the Emergency Department complaining of gradual, persistent, progressively worsening generalized abd cramping with nausea, vomiting, diarrhea onset midnight.  Whitney Benton reports she felt generally ill around 9 pm last night.  She reports 3 episodes of NBNB emesis.  She reports 3 episodes of loose stool without melena or hematochezia.  Whitney Benton denies international travel or sick contacts.  Whitney Benton reports she is "on strike" and has not has sex for > 6 mos.  She denies dysuria, hematuria or vaginal discharge.  Whitney Benton reports she has been able to tolerate some liquids this evening, but the cramping persists.  Nothing seems to make the symptoms better or worse.  Patient denies abdominal surgeries however records shows that she has had a total hysterectomy.     The history is provided by the patient and medical records. No language interpreter was used.    Past Medical History:  Diagnosis Date  . Anxiety   . Depression   . Trichomonas infection     Patient Active Problem List   Diagnosis Date Noted  . Left arm weakness 12/06/2014  . Left arm pain 12/06/2014  . Paresthesias/numbness 12/06/2014  . Left hand weakness 11/14/2014  . Tobacco abuse 11/14/2014  . Hypokalemia 11/14/2014  . Depression   . Mixed incontinence 06/27/2014  . History of TVH in 2006 for fibroids and menorrhagia; benign pathology 09/08/2011    Past Surgical History:  Procedure Laterality Date  . CESAREAN SECTION    . VAGINAL HYSTERECTOMY  2006   Fibroids, menorrhagia, benign pathology    OB History    Gravida Para Term Preterm AB Living   5 3 3   2 3    SAB TAB Ectopic Multiple Live Births   2 0 0 0         Home Medications    Prior  to Admission medications   Medication Sig Start Date End Date Taking? Authorizing Provider  dicyclomine (BENTYL) 20 MG tablet Take 1 tablet (20 mg total) by mouth 2 (two) times daily. 12/19/17   Nasier Thumm, Jarrett Soho, PA-C  ondansetron (ZOFRAN ODT) 8 MG disintegrating tablet 8mg  ODT q4 hours prn nausea 12/19/17   Maleiya Pergola, Jarrett Soho, PA-C  ranitidine (ZANTAC) 150 MG tablet Take 1 tablet (150 mg total) by mouth 2 (two) times daily. 12/19/17   Vibhav Waddill, Jarrett Soho, PA-C    Family History Family History  Problem Relation Age of Onset  . Diabetes Mother   . Mental illness Mother   . Depression Mother   . Hypertension Mother   . Hypertension Father   . Diabetes Father     Social History Social History   Tobacco Use  . Smoking status: Current Every Day Smoker    Packs/day: 0.25    Years: 8.00    Pack years: 2.00    Types: Cigarettes  . Smokeless tobacco: Never Used  Substance Use Topics  . Alcohol use: Yes    Comment: occasional  . Drug use: No     Allergies   Aspirin and Oxycodone   Review of Systems Review of Systems  Constitutional: Negative for appetite change, diaphoresis, fatigue, fever and unexpected weight change.  HENT: Negative for mouth sores.   Eyes: Negative  for visual disturbance.  Respiratory: Negative for cough, chest tightness, shortness of breath and wheezing.   Cardiovascular: Negative for chest pain.  Gastrointestinal: Positive for abdominal pain, diarrhea, nausea and vomiting. Negative for constipation.  Endocrine: Negative for polydipsia, polyphagia and polyuria.  Genitourinary: Negative for dysuria, frequency, hematuria and urgency.  Musculoskeletal: Negative for back pain and neck stiffness.  Skin: Negative for rash.  Allergic/Immunologic: Negative for immunocompromised state.  Neurological: Negative for syncope, light-headedness and headaches.  Hematological: Does not bruise/bleed easily.  Psychiatric/Behavioral: Negative for sleep disturbance.  The patient is not nervous/anxious.      Physical Exam Updated Vital Signs BP (!) 135/99 (BP Location: Right Arm)   Pulse (!) 110   Temp 98.8 F (37.1 C) (Oral)   Resp 14   Ht 5\' 3"  (1.6 m)   Wt 88 kg (194 lb)   SpO2 99%   BMI 34.37 kg/m   Physical Exam  Constitutional: She appears well-developed and well-nourished. No distress.  Awake, alert, nontoxic appearance  HENT:  Head: Normocephalic and atraumatic.  Mouth/Throat: Oropharynx is clear and moist. Mucous membranes are dry. No oropharyngeal exudate.  Eyes: Conjunctivae are normal. No scleral icterus.  Neck: Normal range of motion. Neck supple.  Cardiovascular: Regular rhythm and intact distal pulses. Tachycardia present.  Pulses:      Radial pulses are 2+ on the right side, and 2+ on the left side.  Mild tachycardia  Pulmonary/Chest: Effort normal and breath sounds normal. No respiratory distress. She has no wheezes.  Equal chest expansion  Abdominal: Soft. Bowel sounds are normal. She exhibits no mass. There is generalized tenderness ( mild). There is no rigidity, no rebound, no guarding, no CVA tenderness, no tenderness at McBurney's point and negative Murphy's sign.  Musculoskeletal: Normal range of motion. She exhibits no edema.  Neurological: She is alert.  Speech is clear and goal oriented Moves extremities without ataxia  Skin: Skin is warm and dry. She is not diaphoretic.  Psychiatric: She has a normal mood and affect.  Nursing note and vitals reviewed.    ED Treatments / Results  Labs (all labs ordered are listed, but only abnormal results are displayed) Labs Reviewed  COMPREHENSIVE METABOLIC PANEL - Abnormal; Notable for the following components:      Result Value   Glucose, Bld 138 (*)    Calcium 8.7 (*)    All other components within normal limits  CBC - Abnormal; Notable for the following components:   WBC 11.7 (*)    RBC 5.26 (*)    Hemoglobin 15.2 (*)    Platelets 420 (*)    All other  components within normal limits  URINALYSIS, ROUTINE W REFLEX MICROSCOPIC - Abnormal; Notable for the following components:   APPearance HAZY (*)    Hgb urine dipstick SMALL (*)    Ketones, ur 20 (*)    Leukocytes, UA TRACE (*)    Bacteria, UA FEW (*)    Squamous Epithelial / LPF 0-5 (*)    All other components within normal limits  LIPASE, BLOOD     Radiology Dg Abdomen Acute W/chest  Result Date: 12/19/2017 CLINICAL DATA:  Acute onset of mid abdominal pain, nausea and vomiting. EXAM: DG ABDOMEN ACUTE W/ 1V CHEST COMPARISON:  Chest radiograph and CT of the abdomen and pelvis performed 12/29/2016 FINDINGS: The lungs are well-aerated and clear. There is no evidence of focal opacification, pleural effusion or pneumothorax. The cardiomediastinal silhouette is within normal limits. The visualized bowel gas pattern is unremarkable.  Scattered stool and air are seen within the colon; there is no evidence of small bowel dilatation to suggest obstruction. No free intra-abdominal air is identified on the provided upright view. No acute osseous abnormalities are seen; the sacroiliac joints are unremarkable in appearance. IMPRESSION: 1. Unremarkable bowel gas pattern; no free intra-abdominal air seen. Small amount of stool noted in the colon. 2. No acute cardiopulmonary process seen. Electronically Signed   By: Garald Balding M.D.   On: 12/19/2017 00:31    Procedures Procedures (including critical care time)  Medications Ordered in ED Medications  ondansetron (ZOFRAN-ODT) disintegrating tablet 4 mg (4 mg Oral Given 12/18/17 1455)  sodium chloride 0.9 % bolus 1,000 mL (0 mLs Intravenous Stopped 12/18/17 2315)  dicyclomine (BENTYL) capsule 10 mg (10 mg Oral Given 12/18/17 2314)  ondansetron (ZOFRAN) injection 4 mg (4 mg Intravenous Given 12/18/17 2315)  morphine 2 MG/ML injection 2 mg (2 mg Intravenous Given 12/19/17 0148)  promethazine (PHENERGAN) injection 12.5 mg (12.5 mg Intravenous Given  12/19/17 0148)     Initial Impression / Assessment and Plan / ED Course  I have reviewed the triage vital signs and the nursing notes.  Pertinent labs & imaging results that were available during my care of the patient were reviewed by me and considered in my medical decision making (see chart for details).  Clinical Course as of Dec 19 406  Fri Dec 19, 2017  0030 Whitney Benton with reports of continued discomfort and nausea, but abd is soft and nontender on my exam.  Will give additional pain control and nausea medication.  [HM]  0213 No evidence of bowel obstruction DG Abdomen Acute W/Chest [HM]  0254 Whitney Benton has tolerated PO solids and liquids.  Repeat abd exam is benign.  [HM]  0254 Tachycardia resolved Pulse Rate: 85 [HM]    Clinical Course User Index [HM] Merrilee Ancona, Jarrett Soho, PA-C    Patient with symptoms consistent with gastritis.  Likely viral in nature.  Vitals are stable, no fever.  Tachycardia resolved after fluids.  Patient is nontoxic, nonseptic appearing, in no apparent distress.  Patient does not meet the SIRS or Sepsis criteria.  Whitney Benton's symptoms have been managed in the department; fluid bolus given.  Whitney Benton is tolerating PO fluids > 6 oz.  Lungs are clear.  No focal abdominal pain, no peritoneal signs, no indication of appendicitis, cholecystitis, pancreatitis, ruptured viscus, UTI, kidney stone, PID, ectopic pregnancy.  Supportive therapy indicated.  Patient counseled, expresses understanding and agrees with plan.     Final Clinical Impressions(s) / ED Diagnoses   Final diagnoses:  Generalized abdominal pain  Nausea vomiting and diarrhea    ED Discharge Orders        Ordered    ondansetron (ZOFRAN ODT) 8 MG disintegrating tablet     12/19/17 0253    dicyclomine (BENTYL) 20 MG tablet  2 times daily     12/19/17 0253    ranitidine (ZANTAC) 150 MG tablet  2 times daily     12/19/17 0253       Darus Hershman, Jarrett Soho, PA-C 12/19/17 0409    Sherwood Gambler, MD 12/20/17 [phone]

## 2017-12-18 NOTE — ED Triage Notes (Signed)
Patient c/o abdominal cramping, V/D since last night.

## 2017-12-19 MED ORDER — PROMETHAZINE HCL 25 MG/ML IJ SOLN
12.5000 mg | Freq: Once | INTRAMUSCULAR | Status: AC
  Administered 2017-12-19: 12.5 mg via INTRAVENOUS
  Filled 2017-12-19: qty 1

## 2017-12-19 MED ORDER — ONDANSETRON 8 MG PO TBDP: ORAL_TABLET | ORAL | 0 refills | Status: DC

## 2017-12-19 MED ORDER — RANITIDINE HCL 150 MG PO TABS: 150.0000 mg | ORAL_TABLET | Freq: Two times a day (BID) | ORAL | 0 refills | Status: DC

## 2017-12-19 MED ORDER — DICYCLOMINE HCL 20 MG PO TABS: 20.0000 mg | ORAL_TABLET | Freq: Two times a day (BID) | ORAL | 0 refills | Status: DC

## 2017-12-19 MED ORDER — MORPHINE SULFATE (PF) 2 MG/ML IV SOLN
2.0000 mg | Freq: Once | INTRAVENOUS | Status: AC
  Administered 2017-12-19: 2 mg via INTRAVENOUS
  Filled 2017-12-19: qty 1

## 2017-12-19 NOTE — Discharge Instructions (Signed)
1. Medications: zofran, bentyl, zantac, usual home medications 2. Treatment: rest, drink plenty of fluids, advance diet slowly 3. Follow Up: Please followup with your primary doctor in 2 days for discussion of your diagnoses and further evaluation after today's visit; if you do not have a primary care doctor use the resource guide provided to find one; Please return to the ER for persistent vomiting, high fevers or worsening symptoms

## 2018-03-12 ENCOUNTER — Telehealth: Payer: Self-pay

## 2018-03-12 NOTE — Telephone Encounter (Signed)
-----   Message from Maurine Minister, [place] sent at 02/23/2018  8:46 AM EST ----- Contact: [phone] Hi:  This patient would like for you to give her a call.  She has a rash under her stomach and panty line area.  She would like any suggestions or if she could get meds called in.  Thank you

## 2018-03-12 NOTE — Telephone Encounter (Signed)
Call patient regarding rash there was no answer I have left a message regarding her using some hydrocortisone cream to help with her rash and if she is still having issues she can follow up with her pcp.

## 2018-03-13 ENCOUNTER — Encounter (HOSPITAL_COMMUNITY): Payer: Self-pay | Admitting: Family Medicine

## 2018-03-13 ENCOUNTER — Ambulatory Visit (HOSPITAL_COMMUNITY)
Admission: EM | Admit: 2018-03-13 | Discharge: 2018-03-13 | Disposition: A | Discharge status: Home / Self Care | Payer: Self-pay | Attending: Family Medicine | Admitting: Family Medicine

## 2018-03-13 DIAGNOSIS — J020 Streptococcal pharyngitis: Secondary | ICD-10-CM | POA: Insufficient documentation

## 2018-03-13 DIAGNOSIS — J029 Acute pharyngitis, unspecified: Secondary | ICD-10-CM

## 2018-03-13 LAB — POCT RAPID STREP A: STREPTOCOCCUS, GROUP A SCREEN (DIRECT): NEGATIVE

## 2018-03-13 MED ORDER — FLUCONAZOLE 150 MG PO TABS: 150.0000 mg | ORAL_TABLET | Freq: Once | ORAL | 0 refills | Status: AC

## 2018-03-13 MED ORDER — CHLORHEXIDINE GLUCONATE 0.12 % MT SOLN: 15.0000 mL | Freq: Two times a day (BID) | OROMUCOSAL | 0 refills | Status: DC

## 2018-03-13 NOTE — ED Triage Notes (Signed)
Pt c/o sore throat x 1 week

## 2018-03-13 NOTE — ED Provider Notes (Signed)
[facility]   [id number] 03/13/18 Arrival Time: 0958   SUBJECTIVE:  Whitney Benton is a 47 y.o. female who presents to the urgent care with complaint of sore throat for a week and cough that began yesterday.  No fever.  No nausea or vomiting  She works in Arboriculturist at [employer].  She smokes.     Past Medical History:  Diagnosis Date  . Anxiety   . Depression   . Trichomonas infection    Family History  Problem Relation Age of Onset  . Diabetes Mother   . Mental illness Mother   . Depression Mother   . Hypertension Mother   . Hypertension Father   . Diabetes Father    Social History   Socioeconomic History  . Marital status: Single    Spouse name: Not on file  . Number of children: 3  . Years of education: 57  . Highest education level: Not on file  Occupational History  . Not on file  Social Needs  . Financial resource strain: Not on file  . Food insecurity:    Worry: Not on file    Inability: Not on file  . Transportation needs:    Medical: Not on file    Non-medical: Not on file  Tobacco Use  . Smoking status: Current Every Day Smoker    Packs/day: 0.25    Years: 8.00    Pack years: 2.00    Types: Cigarettes  . Smokeless tobacco: Never Used  Substance and Sexual Activity  . Alcohol use: Yes    Comment: occasional  . Drug use: No  . Sexual activity: Not on file  Lifestyle  . Physical activity:    Days per week: Not on file    Minutes per session: Not on file  . Stress: Not on file  Relationships  . Social connections:    Talks on phone: Not on file    Gets together: Not on file    Attends religious service: Not on file    Active member of club or organization: Not on file    Attends meetings of clubs or organizations: Not on file    Relationship status: Not on file  . Intimate partner violence:    Fear of current or ex partner: Not on file    Emotionally abused: Not on file    Physically abused: Not on file    Forced sexual  activity: Not on file  Other Topics Concern  . Not on file  Social History Narrative   Patient lives at home with mother and father    Patient has 3 children    Patient is single   Patient has 14 years of education    Patient is right handed    No outpatient medications have been marked as taking for the 03/13/18 encounter [facility] Encounter).   Allergies  Allergen Reactions  . Aspirin Hives  . Oxycodone Nausea And Vomiting      ROS: As per HPI, remainder of ROS negative.   OBJECTIVE:   Vitals:   03/13/18 1012  BP: 123/83  Pulse: 88  Resp: 16  Temp: 98.4 F (36.9 C)  SpO2: 100%     General appearance: alert; no distress Eyes: PERRL; EOMI; conjunctiva normal HENT: normocephalic; atraumatic; TMs normal, canal normal, external ears normal without trauma;; oral mucosa reddened posterior pharynx and coated tongue. Neck: supple Lungs: clear to auscultation bilaterally Heart: regular rate and rhythm Back: no CVA tenderness Extremities: no cyanosis  or edema; symmetrical with no gross deformities Skin: warm and dry Neurologic: normal gait; grossly normal Psychological: alert and cooperative; normal mood and affect      Labs:  Results for orders placed or performed during the hospital encounter of 03/13/18  POCT rapid strep A [facility])  Result Value Ref Range   Streptococcus, Group A Screen (Direct) NEGATIVE NEGATIVE    Labs Reviewed  CULTURE, GROUP A STREP [facility])  POCT RAPID STREP A    No results found.     ASSESSMENT & PLAN:  1. Sore throat     Meds ordered this encounter  Medications  . chlorhexidine (PERIDEX) 0.12 % solution    Sig: Use as directed 15 mLs in the mouth or throat 2 (two) times daily.    Dispense:  120 mL    Refill:  0  . fluconazole (DIFLUCAN) 150 MG tablet    Sig: Take 1 tablet (150 mg total) by mouth once for 1 dose. Repeat if needed    Dispense:  2 tablet    Refill:  0    Reviewed expectations re: course of  current medical issues. Questions answered. Outlined signs and symptoms indicating need for more acute intervention. Patient verbalized understanding. After Visit Summary given.    Procedures:      Robyn Haber, MD 03/13/18 1046

## 2018-03-16 LAB — CULTURE, GROUP A STREP (THRC)

## 2018-05-07 ENCOUNTER — Ambulatory Visit (HOSPITAL_COMMUNITY)
Admission: EM | Admit: 2018-05-07 | Discharge: 2018-05-07 | Disposition: A | Discharge status: Home / Self Care | Payer: Self-pay | Attending: Family Medicine | Admitting: Family Medicine

## 2018-05-07 ENCOUNTER — Encounter (HOSPITAL_COMMUNITY): Payer: Self-pay | Admitting: Family Medicine

## 2018-05-07 DIAGNOSIS — M722 Plantar fascial fibromatosis: Secondary | ICD-10-CM

## 2018-05-07 MED ORDER — DICLOFENAC SODIUM 75 MG PO TBEC: 75.0000 mg | DELAYED_RELEASE_TABLET | Freq: Two times a day (BID) | ORAL | 0 refills | Status: DC

## 2018-05-07 NOTE — ED Triage Notes (Signed)
Pt here for left foot pain and heel pain since mothers day. Denis injury.

## 2018-05-07 NOTE — ED Provider Notes (Signed)
[facility]   [id number] 05/07/18 Arrival Time: 0370  ASSESSMENT & PLAN:  1. Plantar fasciitis of left foot    Meds ordered this encounter  Medications  . diclofenac (VOLTAREN) 75 MG EC tablet    Sig: Take 1 tablet (75 mg total) by mouth 2 (two) times daily.    Dispense:  14 tablet    Refill:  0   See AVS for instructions. May f/u as needed. Reviewed expectations re: course of current medical issues. Questions answered. Outlined signs and symptoms indicating need for more acute intervention. Patient verbalized understanding. After Visit Summary given.  SUBJECTIVE: History from: patient. Whitney Benton is a 47 y.o. female who reports intermittent mild pain of her left plantar heel/foot that is stable; described as aching without radiation. Onset: gradual, over the past few weeks. Injury/trama: no. Relieved by: rest; non-weight bearing. Worsened by: certain movements; much worse when she first gets out of bed to walk Associated symptoms: none reported. Extremity sensation changes or weakness: none. Self treatment: has not tried OTCs for relief of pain. History of similar: no  ROS: As per HPI.   OBJECTIVE:  Vitals:   05/07/18 1104  BP: (!) 128/92  Pulse: 97  Resp: 18  Temp: 98.6 F (37 C)  SpO2: 99%    General appearance: alert; no distress Extremities: warm and well perfused; symmetrical with no gross deformities; localized tenderness over her left plantar foot just at heel with no swelling and no bruising; ROM: normal CV: normal extremity capillary refill Skin: warm and dry Neurologic: normal gait; normal symmetric reflexes in all extremities; normal sensation in all extremities Psychological: alert and cooperative; normal mood and affect  Allergies  Allergen Reactions  . Aspirin Hives  . Oxycodone Nausea And Vomiting    Past Medical History:  Diagnosis Date  . Anxiety   . Depression   . Trichomonas infection    Social History    Socioeconomic History  . Marital status: Single    Spouse name: Not on file  . Number of children: 3  . Years of education: 53  . Highest education level: Not on file  Occupational History  . Not on file  Social Needs  . Financial resource strain: Not on file  . Food insecurity:    Worry: Not on file    Inability: Not on file  . Transportation needs:    Medical: Not on file    Non-medical: Not on file  Tobacco Use  . Smoking status: Current Every Day Smoker    Packs/day: 0.25    Years: 8.00    Pack years: 2.00    Types: Cigarettes  . Smokeless tobacco: Never Used  Substance and Sexual Activity  . Alcohol use: Yes    Comment: occasional  . Drug use: No  . Sexual activity: Not on file  Lifestyle  . Physical activity:    Days per week: Not on file    Minutes per session: Not on file  . Stress: Not on file  Relationships  . Social connections:    Talks on phone: Not on file    Gets together: Not on file    Attends religious service: Not on file    Active member of club or organization: Not on file    Attends meetings of clubs or organizations: Not on file    Relationship status: Not on file  . Intimate partner violence:    Fear of current or ex partner: Not on file  Emotionally abused: Not on file    Physically abused: Not on file    Forced sexual activity: Not on file  Other Topics Concern  . Not on file  Social History Narrative   Patient lives at home with mother and father    Patient has 3 children    Patient is single   Patient has 14 years of education    Patient is right handed    Family History  Problem Relation Age of Onset  . Diabetes Mother   . Mental illness Mother   . Depression Mother   . Hypertension Mother   . Hypertension Father   . Diabetes Father    Past Surgical History:  Procedure Laterality Date  . CESAREAN SECTION    . VAGINAL HYSTERECTOMY  2006   Fibroids, menorrhagia, benign pathology      Vanessa Kick, MD 05/11/18  [phone]

## 2018-09-14 ENCOUNTER — Ambulatory Visit (HOSPITAL_COMMUNITY)
Admission: EM | Admit: 2018-09-14 | Discharge: 2018-09-14 | Disposition: A | Discharge status: Home / Self Care | Payer: Self-pay | Attending: Family Medicine | Admitting: Family Medicine

## 2018-09-14 ENCOUNTER — Encounter (HOSPITAL_COMMUNITY): Payer: Self-pay | Admitting: Family Medicine

## 2018-09-14 DIAGNOSIS — M5432 Sciatica, left side: Secondary | ICD-10-CM

## 2018-09-14 MED ORDER — PREDNISONE 20 MG PO TABS: ORAL_TABLET | ORAL | 0 refills | Status: DC

## 2018-09-14 MED ORDER — HYDROCODONE-ACETAMINOPHEN 5-325 MG PO TABS: 1.0000 | ORAL_TABLET | Freq: Four times a day (QID) | ORAL | 0 refills | Status: DC | PRN

## 2018-09-14 NOTE — ED Triage Notes (Signed)
Pt c/o generalized back pain for sevearl days, states her R hip hurts with pain traveling down her R leg.

## 2018-09-14 NOTE — Discharge Instructions (Signed)
If pain is not resolved by Thursday, please return  Continue to gently stretch the back muscles.

## 2018-09-14 NOTE — ED Provider Notes (Signed)
[facility]    CSN: [id number] Arrival date & time: 09/14/18  8341     History   Chief Complaint Chief Complaint  Patient presents with  . Back Pain    HPI Whitney Benton is a 47 y.o. female.   This is a 47 year old woman who comes in for evaluation of left back pain.  The character is generalized back pain for sevearl days,  And she also states her left hip hurts with pain traveling down her R leg.  The pain began in her lower back but has changed to tightening in her entire left side.  She has never had sciatica before.  She works at [employer].  She was on vacation last week when this started 4 days ago.  Patient is having no numbness in her left leg, weakness in the leg, fever, urinary problems.     Past Medical History:  Diagnosis Date  . Anxiety   . Depression   . Trichomonas infection     Patient Active Problem List   Diagnosis Date Noted  . Left arm weakness 12/06/2014  . Left arm pain 12/06/2014  . Paresthesias/numbness 12/06/2014  . Left hand weakness 11/14/2014  . Tobacco abuse 11/14/2014  . Hypokalemia 11/14/2014  . Depression   . Mixed incontinence 06/27/2014  . History of TVH in 2006 for fibroids and menorrhagia; benign pathology 09/08/2011    Past Surgical History:  Procedure Laterality Date  . CESAREAN SECTION    . VAGINAL HYSTERECTOMY  2006   Fibroids, menorrhagia, benign pathology    OB History    Gravida  5   Para  3   Term  3   Preterm      AB  2   Living  3     SAB  2   TAB  0   Ectopic  0   Multiple  0   Live Births               Home Medications    Prior to Admission medications   Medication Sig Start Date End Date Taking? Authorizing Provider  HYDROcodone-acetaminophen (NORCO) 5-325 MG tablet Take 1 tablet by mouth every 6 (six) hours as needed for moderate pain. 09/14/18   Robyn Haber, MD  predniSONE (DELTASONE) 20 MG tablet Two daily with food 09/14/18   Robyn Haber, MD     Family History Family History  Problem Relation Age of Onset  . Diabetes Mother   . Mental illness Mother   . Depression Mother   . Hypertension Mother   . Hypertension Father   . Diabetes Father     Social History Social History   Tobacco Use  . Smoking status: Current Every Day Smoker    Packs/day: 0.25    Years: 8.00    Pack years: 2.00    Types: Cigarettes  . Smokeless tobacco: Never Used  Substance Use Topics  . Alcohol use: Yes    Comment: occasional  . Drug use: No     Allergies   Aspirin and Oxycodone   Review of Systems Review of Systems   Physical Exam Triage Vital Signs ED Triage Vitals  Enc Vitals Group     BP      Pulse      Resp      Temp      Temp src      SpO2      Weight      Height  Head Circumference      Peak Flow      Pain Score      Pain Loc      Pain Edu?      Excl. in [place]?    No data found.  Updated Vital Signs BP (!) 141/88 (BP Location: Left Arm)   Pulse 81   Temp 98.6 F (37 C) (Oral)   Resp 18   SpO2 100%    Physical Exam  Constitutional: She is oriented to person, place, and time. She appears well-developed and well-nourished.  HENT:  Right Ear: External ear normal.  Left Ear: External ear normal.  Mouth/Throat: Oropharynx is clear and moist.  Eyes: Pupils are equal, round, and reactive to light. Conjunctivae are normal.  Neck: Normal range of motion. Neck supple.  Cardiovascular: Intact distal pulses.  Pulmonary/Chest: Effort normal.  Musculoskeletal: Normal range of motion.  Weakly positive straight leg raising on the left.  Neurological: She is alert and oriented to person, place, and time. No sensory deficit.  Skin: Skin is warm and dry.  Psychiatric: She has a normal mood and affect.  Nursing note and vitals reviewed.    UC Treatments / Results  Labs (all labs ordered are listed, but only abnormal results are displayed) Labs Reviewed - No data to display  EKG None  Radiology No  results found.  Procedures Procedures (including critical care time)  Medications Ordered in UC Medications - No data to display  Initial Impression / Assessment and Plan / UC Course  I have reviewed the triage vital signs and the nursing notes.  Pertinent labs & imaging results that were available during my care of the patient were reviewed by me and considered in my medical decision making (see chart for details).    Final Clinical Impressions(s) / UC Diagnoses   Final diagnoses:  Sciatica of left side     Discharge Instructions     If pain is not resolved by Thursday, please return  Continue to gently stretch the back muscles.    ED Prescriptions    Medication Sig Dispense Auth. Provider   predniSONE (DELTASONE) 20 MG tablet Two daily with food 10 tablet Robyn Haber, MD   HYDROcodone-acetaminophen (NORCO) 5-325 MG tablet Take 1 tablet by mouth every 6 (six) hours as needed for moderate pain. 12 tablet Robyn Haber, MD     Controlled Substance Prescriptions [place] Controlled Substance Registry consulted? Not Applicable   Robyn Haber, MD 09/14/18 [phone]

## 2018-09-17 ENCOUNTER — Encounter (HOSPITAL_COMMUNITY): Payer: Self-pay

## 2018-09-17 ENCOUNTER — Ambulatory Visit (INDEPENDENT_AMBULATORY_CARE_PROVIDER_SITE_OTHER): Payer: Self-pay

## 2018-09-17 ENCOUNTER — Other Ambulatory Visit: Payer: Self-pay

## 2018-09-17 ENCOUNTER — Ambulatory Visit (HOSPITAL_COMMUNITY)
Admission: EM | Admit: 2018-09-17 | Discharge: 2018-09-17 | Disposition: A | Discharge status: Home / Self Care | Payer: Self-pay | Attending: Family Medicine | Admitting: Family Medicine

## 2018-09-17 DIAGNOSIS — M5442 Lumbago with sciatica, left side: Secondary | ICD-10-CM | POA: Insufficient documentation

## 2018-09-17 DIAGNOSIS — K5900 Constipation, unspecified: Secondary | ICD-10-CM

## 2018-09-17 DIAGNOSIS — Z885 Allergy status to narcotic agent status: Secondary | ICD-10-CM | POA: Insufficient documentation

## 2018-09-17 DIAGNOSIS — Z886 Allergy status to analgesic agent status: Secondary | ICD-10-CM | POA: Insufficient documentation

## 2018-09-17 DIAGNOSIS — F17210 Nicotine dependence, cigarettes, uncomplicated: Secondary | ICD-10-CM | POA: Insufficient documentation

## 2018-09-17 LAB — POCT URINALYSIS DIP (DEVICE)
Bilirubin Urine: NEGATIVE
Glucose, UA: 100 mg/dL — AB
Ketones, ur: NEGATIVE mg/dL
NITRITE: NEGATIVE
PH: 6.5 (ref 5.0–8.0)
PROTEIN: NEGATIVE mg/dL
SPECIFIC GRAVITY, URINE: 1.025 (ref 1.005–1.030)
Urobilinogen, UA: 1 mg/dL (ref 0.0–1.0)

## 2018-09-17 MED ORDER — POLYETHYLENE GLYCOL 3350 17 GM/SCOOP PO POWD: 25.0000 g | Freq: Once | ORAL | 0 refills | Status: AC

## 2018-09-17 NOTE — Discharge Instructions (Addendum)
There is a large amount of stool that is pressing on the back side of your abdomen that may be contributing to a significant amount of pain.  Take 1 capful of the MiraLAX twice a day for the next couple days.  I expect that pain will start to reduce by Saturday.

## 2018-09-17 NOTE — ED Triage Notes (Signed)
Pt states she's here for a follow on her back pain. 1 week or more

## 2018-09-17 NOTE — ED Provider Notes (Signed)
[facility]    CSN: [id number] Arrival date & time: 09/17/18  2992     History   Chief Complaint Chief Complaint  Patient presents with  . Back Pain    HPI Whitney Benton is a 47 y.o. female.   Pain continues unchanged.  Worse with any movement.  No chance of pregnancy.  Was treated with prednisone Onset:  About a week.  She awoke with the pain at that time.  No cough, nausea, vomiting, diarrhea.  Appetite is diminished. S/P hysterectomy    Note from Monday: This is a 47 year old woman who comes in for evaluation of left back pain.  The character is generalized back pain for sevearl days,  And she also states her left hip hurts with pain traveling down her R leg.  The pain began in her lower back but has changed to tightening in her entire left side.  She has never had sciatica before.  She works at [employer].  She was on vacation last week when this started 4 days ago.  Patient is having no numbness in her left leg, weakness in the leg, fever, urinary problems.      Past Medical History:  Diagnosis Date  . Anxiety   . Depression   . Trichomonas infection     Patient Active Problem List   Diagnosis Date Noted  . Left arm weakness 12/06/2014  . Left arm pain 12/06/2014  . Paresthesias/numbness 12/06/2014  . Left hand weakness 11/14/2014  . Tobacco abuse 11/14/2014  . Hypokalemia 11/14/2014  . Depression   . Mixed incontinence 06/27/2014  . History of TVH in 2006 for fibroids and menorrhagia; benign pathology 09/08/2011    Past Surgical History:  Procedure Laterality Date  . CESAREAN SECTION    . VAGINAL HYSTERECTOMY  2006   Fibroids, menorrhagia, benign pathology    OB History    Gravida  5   Para  3   Term  3   Preterm      AB  2   Living  3     SAB  2   TAB  0   Ectopic  0   Multiple  0   Live Births               Home Medications    Prior to Admission medications   Medication Sig Start Date  End Date Taking? Authorizing Provider  HYDROcodone-acetaminophen (NORCO) 5-325 MG tablet Take 1 tablet by mouth every 6 (six) hours as needed for moderate pain. 09/14/18   Robyn Haber, MD  polyethylene glycol powder (MIRALAX) powder Take 25 g by mouth once for 1 dose. 09/17/18 09/17/18  Robyn Haber, MD  predniSONE (DELTASONE) 20 MG tablet Two daily with food 09/14/18   Robyn Haber, MD    Family History Family History  Problem Relation Age of Onset  . Diabetes Mother   . Mental illness Mother   . Depression Mother   . Hypertension Mother   . Hypertension Father   . Diabetes Father     Social History Social History   Tobacco Use  . Smoking status: Current Every Day Smoker    Packs/day: 0.25    Years: 8.00    Pack years: 2.00    Types: Cigarettes  . Smokeless tobacco: Never Used  Substance Use Topics  . Alcohol use: Yes    Comment: occasional  . Drug use: No     Allergies   Aspirin and Oxycodone  Review of Systems Review of Systems   Physical Exam Triage Vital Signs ED Triage Vitals  Enc Vitals Group     BP 09/17/18 0926 (!) 151/96     Pulse Rate 09/17/18 0926 71     Resp 09/17/18 0926 18     Temp 09/17/18 0926 98 F (36.7 C)     Temp Source 09/17/18 0926 Oral     SpO2 09/17/18 0926 100 %     Weight 09/17/18 0927 205 lb (93 kg)     Height --      Head Circumference --      Peak Flow --      Pain Score --      Pain Loc --      Pain Edu? --      Excl. in [place]? --    No data found.  Updated Vital Signs BP (!) 151/96 (BP Location: Right Arm)   Pulse 71   Temp 98 F (36.7 C) (Oral)   Resp 18   Wt 93 kg   SpO2 100%   BMI 36.31 kg/m    Physical Exam  Constitutional: She appears well-developed and well-nourished.  HENT:  Right Ear: External ear normal.  Left Ear: External ear normal.  Mouth/Throat: Oropharynx is clear and moist.  Eyes: Pupils are equal, round, and reactive to light. Conjunctivae are normal.  Neck: Normal range of  motion. Neck supple.  Pulmonary/Chest: Effort normal.  Abdominal: Soft.  Musculoskeletal:  Weakly positive straight leg raising on the left  Neurological: She is alert.  Skin: Skin is warm and dry.  Nursing note and vitals reviewed.    UC Treatments / Results  Labs (all labs ordered are listed, but only abnormal results are displayed) Labs Reviewed  POCT URINALYSIS DIP (DEVICE) - Abnormal; Notable for the following components:      Result Value   Glucose, UA 100 (*)    Hgb urine dipstick TRACE (*)    Leukocytes, UA TRACE (*)    All other components within normal limits  URINE CULTURE    EKG None  Radiology Dg Lumbar Spine Complete  Result Date: 09/17/2018 CLINICAL DATA:  Low back pain for 1 week, no injury EXAM: LUMBAR SPINE - COMPLETE 4+ VIEW COMPARISON:  CT abdomen pelvis of 12/29/2016 FINDINGS: The lumbar vertebrae remain in normal alignment. Intervertebral disc spaces appear normal. No compression deformity is seen. No significant degenerative change is evident. The SI joints appear well corticated. The bowel gas pattern is nonspecific. A small left renal calculus cannot be excluded. IMPRESSION: 1. Normal alignment of the lumbar vertebrae with normal disc spaces. No acute abnormality. 2. Question small left renal calculus. Electronically Signed   By: Ivar Drape M.D.   On: 09/17/2018 10:29    Procedures Procedures (including critical care time)  Medications Ordered in UC Medications - No data to display  Initial Impression / Assessment and Plan / UC Course  I have reviewed the triage vital signs and the nursing notes.  Pertinent labs & imaging results that were available during my care of the patient were reviewed by me and considered in my medical decision making (see chart for details).    Final Clinical Impressions(s) / UC Diagnoses   Final diagnoses:  Acute left-sided low back pain with left-sided sciatica  Constipation, unspecified constipation type      Discharge Instructions     There is a large amount of stool that is pressing on the back side of your abdomen that  may be contributing to a significant amount of pain.  Take 1 capful of the MiraLAX twice a day for the next couple days.  I expect that pain will start to reduce by Saturday.    ED Prescriptions    Medication Sig Dispense Auth. Provider   polyethylene glycol powder (MIRALAX) powder Take 25 g by mouth once for 1 dose. 255 g Robyn Haber, MD     Controlled Substance Prescriptions [place] Controlled Substance Registry consulted? Not Applicable   Robyn Haber, MD 09/17/18 1106

## 2018-09-18 LAB — URINE CULTURE

## 2018-09-30 ENCOUNTER — Encounter (HOSPITAL_COMMUNITY): Payer: Self-pay | Admitting: Emergency Medicine

## 2018-09-30 ENCOUNTER — Ambulatory Visit (HOSPITAL_COMMUNITY)
Admission: EM | Admit: 2018-09-30 | Discharge: 2018-09-30 | Disposition: A | Discharge status: Home / Self Care | Payer: Medicaid Other | Attending: Family Medicine | Admitting: Family Medicine

## 2018-09-30 DIAGNOSIS — B353 Tinea pedis: Secondary | ICD-10-CM

## 2018-09-30 MED ORDER — IBUPROFEN 600 MG PO TABS: 600.0000 mg | ORAL_TABLET | Freq: Four times a day (QID) | ORAL | 0 refills | Status: DC | PRN

## 2018-09-30 MED ORDER — CLOTRIMAZOLE 1 % EX CREA: TOPICAL_CREAM | CUTANEOUS | 0 refills | Status: DC

## 2018-09-30 NOTE — ED Triage Notes (Signed)
Pt states she looked at the bottom of her right foot and noticed a bubble in her skin. Pt has peeling skin on bottom of right foot.

## 2018-09-30 NOTE — Discharge Instructions (Signed)
Please apply clotrimazole cream twice daily to foot for the next 3 to 4 weeks Please keep foot clean and dry Use anti-inflammatories for pain/swelling. You may take up to 800 mg Ibuprofen every 8 hours with food. You may supplement Ibuprofen with Tylenol [phone] mg every 8 hours.   Fungal infections can take a while to fully resolve, please be consistent and diligent about using this twice daily.  Please follow-up if pain worsening, symptoms worsening, developing increased skin peeling, pain with walking

## 2018-10-01 NOTE — ED Provider Notes (Signed)
[facility]    CSN: [id number] Arrival date & time: 09/30/18  1451     History   Chief Complaint No chief complaint on file.   HPI Whitney Benton is a 47 y.o. female history of tobacco use, anxiety and depression presenting today for evaluation of foot pain.  Patient has noticed over the past 2 days that she has had skin changes to the bottom of her foot.  She has had peeling as well as some tenderness.  She has also noticed a rash in between the toes of her feet.  She has not tried anything on this.  Denies history of diabetes.  HPI  Past Medical History:  Diagnosis Date  . Anxiety   . Depression   . Trichomonas infection     Patient Active Problem List   Diagnosis Date Noted  . Left arm weakness 12/06/2014  . Left arm pain 12/06/2014  . Paresthesias/numbness 12/06/2014  . Left hand weakness 11/14/2014  . Tobacco abuse 11/14/2014  . Hypokalemia 11/14/2014  . Depression   . Mixed incontinence 06/27/2014  . History of TVH in 2006 for fibroids and menorrhagia; benign pathology 09/08/2011    Past Surgical History:  Procedure Laterality Date  . CESAREAN SECTION    . VAGINAL HYSTERECTOMY  2006   Fibroids, menorrhagia, benign pathology    OB History    Gravida  5   Para  3   Term  3   Preterm      AB  2   Living  3     SAB  2   TAB  0   Ectopic  0   Multiple  0   Live Births               Home Medications    Prior to Admission medications   Medication Sig Start Date End Date Taking? Authorizing Provider  clotrimazole (LOTRIMIN) 1 % cream Apply to affected area 2 times daily 09/30/18   Wieters, Hallie C, PA-C  ibuprofen (ADVIL,MOTRIN) 600 MG tablet Take 1 tablet (600 mg total) by mouth every 6 (six) hours as needed. 09/30/18   Wieters, Elesa Hacker, PA-C    Family History Family History  Problem Relation Age of Onset  . Diabetes Mother   . Mental illness Mother   . Depression Mother   . Hypertension Mother   . Hypertension Father    . Diabetes Father     Social History Social History   Tobacco Use  . Smoking status: Current Every Day Smoker    Packs/day: 0.25    Years: 8.00    Pack years: 2.00    Types: Cigarettes  . Smokeless tobacco: Never Used  Substance Use Topics  . Alcohol use: Yes    Comment: occasional  . Drug use: No     Allergies   Aspirin and Oxycodone   Review of Systems Review of Systems  Constitutional: Negative for fatigue and fever.  HENT: Negative for mouth sores.   Eyes: Negative for visual disturbance.  Respiratory: Negative for shortness of breath.   Cardiovascular: Negative for chest pain.  Gastrointestinal: Negative for abdominal pain, nausea and vomiting.  Genitourinary: Negative for genital sores.  Musculoskeletal: Positive for arthralgias. Negative for joint swelling.  Skin: Positive for color change. Negative for rash and wound.  Neurological: Negative for dizziness, weakness, light-headedness and headaches.     Physical Exam Triage Vital Signs ED Triage Vitals  Enc Vitals Group     BP 09/30/18  1533 139/90     Pulse Rate 09/30/18 1533 94     Resp 09/30/18 1533 18     Temp 09/30/18 1533 98.2 F (36.8 C)     Temp Source 09/30/18 1533 Oral     SpO2 09/30/18 1533 97 %     Weight --      Height --      Head Circumference --      Peak Flow --      Pain Score 09/30/18 1539 9     Pain Loc --      Pain Edu? --      Excl. in [place]? --    No data found.  Updated Vital Signs BP 139/90 (BP Location: Left Arm)   Pulse 94   Temp 98.2 F (36.8 C) (Oral)   Resp 18   SpO2 97%   Visual Acuity Right Eye Distance:   Left Eye Distance:   Bilateral Distance:    Right Eye Near:   Left Eye Near:    Bilateral Near:     Physical Exam  Constitutional: She is oriented to person, place, and time. She appears well-developed and well-nourished.  No acute distress  HENT:  Head: Normocephalic and atraumatic.  Nose: Nose normal.  Eyes: Conjunctivae are normal.  Neck:  Neck supple.  Cardiovascular: Normal rate.  Pulmonary/Chest: Effort normal. No respiratory distress.  Abdominal: She exhibits no distension.  Musculoskeletal: Normal range of motion.  Neurological: She is alert and oriented to person, place, and time.  Skin: Skin is warm and dry.  Plantar surface of foot over the ball of foot with skin peeling, mild tenderness to palpation underneath the area of peeling.  In between all toes slight peeling with white discoloration.  Dorsalis pedis 2+  Psychiatric: She has a normal mood and affect.  Nursing note and vitals reviewed.    UC Treatments / Results  Labs (all labs ordered are listed, but only abnormal results are displayed) Labs Reviewed - No data to display  EKG None  Radiology No results found.  Procedures Procedures (including critical care time)  Medications Ordered in UC Medications - No data to display  Initial Impression / Assessment and Plan / UC Course  I have reviewed the triage vital signs and the nursing notes.  Pertinent labs & imaging results that were available during my care of the patient were reviewed by me and considered in my medical decision making (see chart for details).     Patient appears to have tinea pedis, will treat with clotrimazole topically twice daily for the next 2 to 3 weeks.  Sensation intact, neurovascularly intact.  Tylenol for pain. Discussed strict return precautions. Patient verbalized understanding and is agreeable with plan.  Final Clinical Impressions(s) / UC Diagnoses   Final diagnoses:  Tinea pedis of right foot     Discharge Instructions     Please apply clotrimazole cream twice daily to foot for the next 3 to 4 weeks Please keep foot clean and dry Use anti-inflammatories for pain/swelling. You may take up to 800 mg Ibuprofen every 8 hours with food. You may supplement Ibuprofen with Tylenol [phone] mg every 8 hours.   Fungal infections can take a while to fully resolve,  please be consistent and diligent about using this twice daily.  Please follow-up if pain worsening, symptoms worsening, developing increased skin peeling, pain with walking   ED Prescriptions    Medication Sig Dispense Auth. Provider   clotrimazole (LOTRIMIN) 1 % cream  Apply to affected area 2 times daily 60 g Wieters, Hallie C, PA-C   ibuprofen (ADVIL,MOTRIN) 600 MG tablet Take 1 tablet (600 mg total) by mouth every 6 (six) hours as needed. 30 tablet Wieters, [place] C, PA-C     Controlled Substance Prescriptions [place] Controlled Substance Registry consulted? Not Applicable   Janith [place], [place] 10/01/18 1334

## 2019-01-06 ENCOUNTER — Telehealth (HOSPITAL_COMMUNITY): Payer: Self-pay | Admitting: Family Medicine

## 2019-01-06 ENCOUNTER — Encounter (HOSPITAL_COMMUNITY): Payer: Self-pay | Admitting: Emergency Medicine

## 2019-01-06 ENCOUNTER — Telehealth (HOSPITAL_COMMUNITY): Payer: Self-pay | Admitting: Emergency Medicine

## 2019-01-06 ENCOUNTER — Ambulatory Visit (HOSPITAL_COMMUNITY)
Admission: EM | Admit: 2019-01-06 | Discharge: 2019-01-06 | Disposition: A | Discharge status: Home / Self Care | Payer: Medicaid Other | Attending: Family Medicine | Admitting: Family Medicine

## 2019-01-06 DIAGNOSIS — R631 Polydipsia: Secondary | ICD-10-CM | POA: Insufficient documentation

## 2019-01-06 DIAGNOSIS — E1165 Type 2 diabetes mellitus with hyperglycemia: Secondary | ICD-10-CM | POA: Insufficient documentation

## 2019-01-06 LAB — POCT URINALYSIS DIP (DEVICE)
BILIRUBIN URINE: NEGATIVE
Glucose, UA: >=1000 mg/dL — AB
Ketones, ur: 40 mg/dL — AB
Leukocytes, UA: NEGATIVE
NITRITE: NEGATIVE
PH: 6 (ref 5.0–8.0)
Protein, ur: NEGATIVE mg/dL
Specific Gravity, Urine: 1.015 (ref 1.005–1.030)
Urobilinogen, UA: 0.2 mg/dL (ref 0.0–1.0)

## 2019-01-06 LAB — POCT I-STAT, CHEM 8
BUN: 11 mg/dL (ref 6–20)
CALCIUM ION: 1.16 mmol/L (ref 1.15–1.40)
CREATININE: 0.8 mg/dL (ref 0.44–1.00)
Chloride: 100 mmol/L (ref 98–111)
Glucose, Bld: 329 mg/dL — ABNORMAL HIGH (ref 70–99)
HEMATOCRIT: 46 % (ref 36.0–46.0)
HEMOGLOBIN: 15.6 g/dL — AB (ref 12.0–15.0)
Potassium: 3.8 mmol/L (ref 3.5–5.1)
Sodium: 138 mmol/L (ref 135–145)
TCO2: 27 mmol/L (ref 22–32)

## 2019-01-06 LAB — GLUCOSE, CAPILLARY: Glucose-Capillary: 369 mg/dL — ABNORMAL HIGH (ref 70–99)

## 2019-01-06 LAB — HEMOGLOBIN A1C
HEMOGLOBIN A1C: 11.7 % — AB (ref 4.8–5.6)
Mean Plasma Glucose: 289.09 mg/dL

## 2019-01-06 MED ORDER — INSULIN ASPART 100 UNIT/ML ~~LOC~~ SOLN
SUBCUTANEOUS | Status: AC
  Filled 2019-01-06: qty 1

## 2019-01-06 MED ORDER — INSULIN GLARGINE 100 UNIT/ML ~~LOC~~ SOLN: 10.0000 [IU] | Freq: Every day | SUBCUTANEOUS | 0 refills | Status: DC

## 2019-01-06 MED ORDER — INSULIN ASPART 100 UNIT/ML ~~LOC~~ SOLN
8.0000 [IU] | Freq: Once | SUBCUTANEOUS | Status: AC
  Administered 2019-01-06: 8 [IU] via SUBCUTANEOUS

## 2019-01-06 MED ORDER — METFORMIN HCL 500 MG PO TABS: 500.0000 mg | ORAL_TABLET | Freq: Two times a day (BID) | ORAL | 1 refills | Status: DC

## 2019-01-06 NOTE — ED Provider Notes (Signed)
[place]   [id number] 01/06/19 Arrival Time: 3818  ASSESSMENT & PLAN:  1. Uncontrolled type 2 diabetes mellitus with hyperglycemia ([place])   2. Polydipsia    New diagnosis of DM.  Meds ordered this encounter  Medications  . insulin aspart (novoLOG) injection 8 Units   Awaiting HgA1c to determine initial therapy. Discussed importance of establishing care with PCP. No indication for ED evaluation at this time. Discussed.  Follow-up Information    Schedule an appointment as soon as possible for a visit  with [facility].   Contact information: [place] [id number] [place] In 2 days.   Specialty:  Urgent Care Why:  For a blood sugar check. Contact information: [place].   Specialty:  Emergency Medicine Why:  If symptoms worsen. Contact information: [address] [id number] [place] [phone]         See AVS for written information concerning new diagnosis of DM.  Reviewed expectations re: course of current medical issues. Questions answered. Outlined signs and symptoms indicating need for more acute intervention. Patient verbalized understanding. After Visit Summary given.   SUBJECTIVE: History from: patient. Whitney Benton is a 48 y.o. female who presents with complaint of fairly persistent increased thirst and increased frequency of urination. No dysuria or vaginal discharge. Gradual onset of symptoms over the past one to two weeks without specific aggravating or alleviating factors identified. Mild nausea without emesis. Tolerating PO intake. No diarrhea. Normal bowel movements. Mild URI symptoms earlier this week that have resolved. Afebrile. Overall fatigued. Has noticed  intermittent blurring of vision over the past week. Has an eye doctor appt in a couple of days. Does not wear contacts. No loss of vision. Works at [employer]. Checked blood sugar yesterday: 387. Family member with diabetes. No abdominal or back pain. No respiratory problems. Ambulatory without difficulty. No new medications.  No LMP recorded. Patient has had a hysterectomy.   Past Surgical History:  Procedure Laterality Date  . CESAREAN SECTION    . VAGINAL HYSTERECTOMY  2006   Fibroids, menorrhagia, benign pathology   ROS: As per HPI. All other systems negative.  OBJECTIVE:  Vitals:   01/06/19 1033  BP: 113/78  Pulse: 79  Temp: 98.1 F (36.7 C)  TempSrc: Temporal  SpO2: 100%    General appearance: alert, oriented, no acute distress and obese  HEENT: [place]; AT; PERRLA; EOMI; oropharynx moist Lungs: clear to auscultation bilaterally; unlabored respirations Heart: regular rate and rhythm Abdomen: soft; without distention; no tenderness; normal bowel sounds; without masses or organomegaly; without guarding or rebound tenderness Back: without CVA tenderness; FROM at waist Extremities: without LE edema; symmetrical; without gross deformities Skin: warm and dry Neurologic: normal gait Psychological: alert and cooperative; normal mood and affect  Labs: Results for orders placed or performed during the hospital encounter of 01/06/19  Glucose, capillary  Result Value Ref Range   Glucose-Capillary 369 (H) 70 - 99 mg/dL   Comment 1 Notify RN    Comment 2 Document in Chart   POCT urinalysis dip (device)  Result Value Ref Range   Glucose, UA >=1000 (A) NEGATIVE mg/dL   Bilirubin Urine NEGATIVE NEGATIVE   Ketones, ur 40 (A) NEGATIVE mg/dL   Specific Gravity, Urine 1.015  1.005 - 1.030   Hgb urine dipstick TRACE (A) NEGATIVE   pH 6.0 5.0 - 8.0   Protein, ur NEGATIVE NEGATIVE mg/dL   Urobilinogen, UA 0.2 0.0 - 1.0 mg/dL   Nitrite NEGATIVE NEGATIVE   Leukocytes, UA  NEGATIVE NEGATIVE  I-STAT, chem 8  Result Value Ref Range   Sodium 138 135 - 145 mmol/L   Potassium 3.8 3.5 - 5.1 mmol/L   Chloride 100 98 - 111 mmol/L   BUN 11 6 - 20 mg/dL   Creatinine, Ser 0.80 0.44 - 1.00 mg/dL   Glucose, Bld 329 (H) 70 - 99 mg/dL   Calcium, Ion 1.16 1.15 - 1.40 mmol/L   TCO2 27 22 - 32 mmol/L   Hemoglobin 15.6 (H) 12.0 - 15.0 g/dL   HCT 46.0 36.0 - 46.0 %   Labs Reviewed  GLUCOSE, CAPILLARY - Abnormal; Notable for the following components:      Result Value   Glucose-Capillary 369 (*)    All other components within normal limits  POCT URINALYSIS DIP (DEVICE) - Abnormal; Notable for the following components:   Glucose, UA >=1000 (*)    Ketones, ur 40 (*)    Hgb urine dipstick TRACE (*)    All other components within normal limits  POCT I-STAT, CHEM 8 - Abnormal; Notable for the following components:   Glucose, Bld 329 (*)    Hemoglobin 15.6 (*)    All other components within normal limits  HEMOGLOBIN A1C  CBG MONITORING, ED  I-STAT CHEM 8, ED    Allergies  Allergen Reactions  . Aspirin Hives  . Oxycodone Nausea And Vomiting                                               Past Medical History:  Diagnosis Date  . Anxiety   . Depression   . Trichomonas infection    Social History   Socioeconomic History  . Marital status: Single    Spouse name: Not on file  . Number of children: 3  . Years of education: 35  . Highest education level: Not on file  Occupational History  . Not on file  Social Needs  . Financial resource strain: Not on file  . Food insecurity:    Worry: Not on file    Inability: Not on file  . Transportation needs:    Medical: Not on file    Non-medical: Not on file  Tobacco Use  . Smoking status: Current Every Day Smoker    Packs/day: 0.25    Years: 8.00    Pack years: 2.00    Types: Cigarettes  . Smokeless tobacco: Never Used  Substance and Sexual Activity  . Alcohol use: Yes    Comment: occasional  . Drug use: No   . Sexual activity: Not on file  Lifestyle  . Physical activity:    Days per week: Not on file    Minutes per session: Not on file  . Stress: Not on file  Relationships  . Social connections:    Talks on phone: Not on file    Gets together: Not on file    Attends religious service: Not on file    Active member of club or organization: Not on file    Attends meetings of clubs or organizations: Not on file    Relationship status: Not on file  .  Intimate partner violence:    Fear of current or ex partner: Not on file    Emotionally abused: Not on file    Physically abused: Not on file    Forced sexual activity: Not on file  Other Topics Concern  . Not on file  Social History Narrative   Patient lives at home with mother and father    Patient has 3 children    Patient is single   Patient has 14 years of education    Patient is right handed    Family History  Problem Relation Age of Onset  . Diabetes Mother   . Mental illness Mother   . Depression Mother   . Hypertension Mother   . Hypertension Father   . Diabetes Father      Vanessa Kick, MD 01/06/19 1221

## 2019-01-06 NOTE — Discharge Instructions (Signed)
We will call you to determine the type of medicine we will start once I receive your blood tests today.

## 2019-01-06 NOTE — Telephone Encounter (Signed)
She comes by. Reports that she cannot afford Lantus pen. After discussion, she prefers to start Metformin and follow up on 1/20 for a blood sugar check. Rx sent to her pharmacy on file.  Agrees to proceed to the ED with any worsening symptoms.

## 2019-01-06 NOTE — Telephone Encounter (Signed)
Attempted to call pt with lab results no answer and LVMM

## 2019-01-06 NOTE — Telephone Encounter (Signed)
HgA1c elevated at 11.7. Given her high blood sugar here today and new diagnosis of diabetes, I recommend starting insulin to bring blood sugar down before placing her on oral medications. I have sent a prescription for a Lantus pen to her pharmacy on file. Please inform. According to [facility] this may run about $200 but may prevent her from needing more frequent follow up or needing to go the the Emergency Department until she can establish care with a PCP.

## 2019-01-06 NOTE — ED Triage Notes (Signed)
Pt states she has been having some vision problems with distance, she has an eye doctor appointment on Friday.  She also reports not feeling well and having a BS of 387 yesterday at work.  She has not been previously diagnosed with DM and does not have a PCP.  She states her vision problems came on suddenly last Thursday and she has been very thirsty and not feeling well, with no appetite.

## 2019-01-07 ENCOUNTER — Telehealth (HOSPITAL_COMMUNITY): Payer: Self-pay | Admitting: Emergency Medicine

## 2019-01-07 NOTE — Telephone Encounter (Signed)
Patient contacted and made aware of all results, all questions answered.   

## 2019-01-11 ENCOUNTER — Ambulatory Visit (HOSPITAL_COMMUNITY)
Admission: EM | Admit: 2019-01-11 | Discharge: 2019-01-11 | Disposition: A | Discharge status: Home / Self Care | Payer: Medicaid Other | Attending: Family Medicine | Admitting: Family Medicine

## 2019-01-11 ENCOUNTER — Encounter (HOSPITAL_COMMUNITY): Payer: Self-pay | Admitting: Emergency Medicine

## 2019-01-11 ENCOUNTER — Telehealth (HOSPITAL_COMMUNITY): Payer: Self-pay | Admitting: Family Medicine

## 2019-01-11 ENCOUNTER — Other Ambulatory Visit: Payer: Self-pay

## 2019-01-11 DIAGNOSIS — E1165 Type 2 diabetes mellitus with hyperglycemia: Secondary | ICD-10-CM

## 2019-01-11 LAB — GLUCOSE, CAPILLARY: Glucose-Capillary: 251 mg/dL — ABNORMAL HIGH (ref 70–99)

## 2019-01-11 MED ORDER — ONDANSETRON 4 MG PO TBDP: 4.0000 mg | ORAL_TABLET | Freq: Three times a day (TID) | ORAL | 0 refills | Status: DC | PRN

## 2019-01-11 NOTE — ED Triage Notes (Signed)
Patient here as instructed at previous visit.  Patient is to have cbg check

## 2019-01-11 NOTE — ED Provider Notes (Signed)
  [place]   [id number] 01/11/19 Arrival Time: 3552  See phone note in patient's chart. Recheck blood glucose.    Vanessa Kick, MD 01/11/19 [phone]

## 2019-01-11 NOTE — Telephone Encounter (Signed)
Requests nausea medication sent to her pharmacy.  Seen here today for a nurse visit. Blood sugar 251. No worsening symptoms.

## 2019-01-11 NOTE — ED Notes (Signed)
CBG results reported to Dr. Mannie Stabile and Reita Cliche RN

## 2019-02-04 ENCOUNTER — Ambulatory Visit: Payer: Self-pay | Attending: Internal Medicine | Admitting: Internal Medicine

## 2019-02-04 ENCOUNTER — Encounter: Payer: Self-pay | Admitting: Internal Medicine

## 2019-02-04 VITALS — BP 112/76 | HR 78 | Temp 97.9°F | Resp 16 | Ht 64.0 in | Wt 199.6 lb

## 2019-02-04 DIAGNOSIS — F17210 Nicotine dependence, cigarettes, uncomplicated: Secondary | ICD-10-CM | POA: Insufficient documentation

## 2019-02-04 DIAGNOSIS — Z885 Allergy status to narcotic agent status: Secondary | ICD-10-CM | POA: Insufficient documentation

## 2019-02-04 DIAGNOSIS — Z2821 Immunization not carried out because of patient refusal: Secondary | ICD-10-CM

## 2019-02-04 DIAGNOSIS — Z6830 Body mass index (BMI) 30.0-30.9, adult: Secondary | ICD-10-CM | POA: Insufficient documentation

## 2019-02-04 DIAGNOSIS — Z7984 Long term (current) use of oral hypoglycemic drugs: Secondary | ICD-10-CM | POA: Insufficient documentation

## 2019-02-04 DIAGNOSIS — Z79899 Other long term (current) drug therapy: Secondary | ICD-10-CM | POA: Insufficient documentation

## 2019-02-04 DIAGNOSIS — F17200 Nicotine dependence, unspecified, uncomplicated: Secondary | ICD-10-CM | POA: Insufficient documentation

## 2019-02-04 DIAGNOSIS — Z833 Family history of diabetes mellitus: Secondary | ICD-10-CM | POA: Insufficient documentation

## 2019-02-04 DIAGNOSIS — Z8249 Family history of ischemic heart disease and other diseases of the circulatory system: Secondary | ICD-10-CM | POA: Insufficient documentation

## 2019-02-04 DIAGNOSIS — E1165 Type 2 diabetes mellitus with hyperglycemia: Secondary | ICD-10-CM

## 2019-02-04 DIAGNOSIS — Z6834 Body mass index (BMI) 34.0-34.9, adult: Secondary | ICD-10-CM

## 2019-02-04 DIAGNOSIS — E119 Type 2 diabetes mellitus without complications: Secondary | ICD-10-CM | POA: Insufficient documentation

## 2019-02-04 DIAGNOSIS — Z886 Allergy status to analgesic agent status: Secondary | ICD-10-CM | POA: Insufficient documentation

## 2019-02-04 DIAGNOSIS — E669 Obesity, unspecified: Secondary | ICD-10-CM | POA: Insufficient documentation

## 2019-02-04 LAB — GLUCOSE, POCT (MANUAL RESULT ENTRY): POC GLUCOSE: 204 mg/dL — AB (ref 70–99)

## 2019-02-04 MED ORDER — METFORMIN HCL ER 500 MG PO TB24: 500.0000 mg | ORAL_TABLET | Freq: Two times a day (BID) | ORAL | 3 refills | Status: DC

## 2019-02-04 MED ORDER — DULAGLUTIDE 0.75 MG/0.5ML ~~LOC~~ SOAJ: 0.7500 mg | SUBCUTANEOUS | 6 refills | Status: DC

## 2019-02-04 MED ORDER — GLUCOSE BLOOD VI STRP: ORAL_STRIP | 12 refills | Status: DC

## 2019-02-04 MED ORDER — TRUEPLUS LANCETS 28G MISC: 12 refills | Status: DC

## 2019-02-04 MED ORDER — TRUE METRIX METER W/DEVICE KIT: PACK | 0 refills | Status: DC

## 2019-02-04 MED FILL — !TRUE METRIX BLOOD GLUCOSE: 1 days supply | Qty: 1 | Fill #0

## 2019-02-04 MED FILL — !TRULICITY 0.75 MG/0.5 ML P: 0.75 | 14 days supply | Qty: 1 | Fill #0

## 2019-02-04 MED FILL — TRUE METRIX TEST STRIP: 30 days supply | Qty: 100 | Fill #0

## 2019-02-04 MED FILL — TRUEplus LANCETS 28G MISC: 30 days supply | Qty: 100 | Fill #0

## 2019-02-04 MED FILL — METFORMIN HCL ER 500 MG TAB: 500 | 30 days supply | Qty: 60 | Fill #0

## 2019-02-04 NOTE — Patient Instructions (Signed)
Please give patient an appointment with the clinical pharmacist in 3 weeks for further titration of diabetes medications.  Check your blood sugars twice a day before meals.  Goal for blood sugars before meals is be 90-130.  Start Trulicity once a week.  Continue metformin 500 mg twice a day.  Call [phone] quit now and request the nicotine patches and gum for free.   Diabetes Mellitus and Standards of Medical Care Managing diabetes (diabetes mellitus) can be complicated. Your diabetes treatment may be managed by a team of health care providers, including:  A physician who specializes in diabetes (endocrinologist).  A nurse practitioner or physician assistant.  Nurses.  A diet and nutrition specialist (registered dietitian).  A certified diabetes educator (CDE).  An exercise specialist.  A pharmacist.  An eye doctor.  A foot specialist (podiatrist).  A dentist.  A primary care provider.  A mental health provider. Your health care providers follow guidelines to help you get the best quality of care. The following schedule is a general guideline for your diabetes management plan. Your health care providers may give you more specific instructions. Physical exams Upon being diagnosed with diabetes mellitus, and each year after that, your health care provider will ask about your medical and family history. He or she will also do a physical exam. Your exam may include:  Measuring your height, weight, and body mass index (BMI).  Checking your blood pressure. This will be done at every routine medical visit. Your target blood pressure may vary depending on your medical conditions, your age, and other factors.  Thyroid gland exam.  Skin exam.  Screening for damage to your nerves (peripheral neuropathy). This may include checking the pulse in your legs and feet and checking the level of sensation in your hands and feet.  A complete foot exam to inspect the structure and skin of  your feet, including checking for cuts, bruises, redness, blisters, sores, or other problems.  Screening for blood vessel (vascular) problems, which may include checking the pulse in your legs and feet and checking your temperature. Blood tests Depending on your treatment plan and your personal needs, you may have the following tests done:  HbA1c (hemoglobin A1c). This test provides information about blood sugar (glucose) control over the previous 2-3 months. It is used to adjust your treatment plan, if needed. This test will be done: ? At least 2 times a year, if you are meeting your treatment goals. ? 4 times a year, if you are not meeting your treatment goals or if treatment goals have changed.  Lipid testing, including total, LDL, and HDL cholesterol and triglyceride levels. ? The goal for LDL is less than 100 mg/dL (5.5 mmol/L). If you are at high risk for complications, the goal is less than 70 mg/dL (3.9 mmol/L). ? The goal for HDL is 40 mg/dL (2.2 mmol/L) or higher for men and 50 mg/dL (2.8 mmol/L) or higher for women. An HDL cholesterol of 60 mg/dL (3.3 mmol/L) or higher gives some protection against heart disease. ? The goal for triglycerides is less than 150 mg/dL (8.3 mmol/L).  Liver function tests.  Kidney function tests.  Thyroid function tests. Dental and eye exams  Visit your dentist two times a year.  If you have type 1 diabetes, your health care provider may recommend an eye exam 3-5 years after you are diagnosed, and then once a year after your first exam. ? For children with type 1 diabetes, a health care provider  may recommend an eye exam when your child is age 72 or older and has had diabetes for 3-5 years. After the first exam, your child should get an eye exam once a year.  If you have type 2 diabetes, your health care provider may recommend an eye exam as soon as you are diagnosed, and then once a year after your first exam. Immunizations   The yearly flu  (influenza) vaccine is recommended for everyone 6 months or older who has diabetes.  The pneumonia (pneumococcal) vaccine is recommended for everyone 2 years or older who has diabetes. If you are 74 or older, you may get the pneumonia vaccine as a series of two separate shots.  The hepatitis B vaccine is recommended for adults shortly after being diagnosed with diabetes.  Adults and children with diabetes should receive all other vaccines according to age-specific recommendations from the Centers for Disease Control and Prevention (CDC). Mental and emotional health Screening for symptoms of eating disorders, anxiety, and depression is recommended at the time of diagnosis and afterward as needed. If your screening shows that you have symptoms (positive screening result), you may need more evaluation and you may work with a mental health care provider. Treatment plan Your treatment plan will be reviewed at every medical visit. You and your health care provider will discuss:  How you are taking your medicines, including insulin.  Any side effects you are experiencing.  Your blood glucose target goals.  The frequency of your blood glucose monitoring.  Lifestyle habits, such as activity level as well as tobacco, alcohol, and substance use. Diabetes self-management education Your health care provider will assess how well you are monitoring your blood glucose levels and whether you are taking your insulin correctly. He or she may refer you to:  A certified diabetes educator to manage your diabetes throughout your life, starting at diagnosis.  A registered dietitian who can create or review your personal nutrition plan.  An exercise specialist who can discuss your activity level and exercise plan. Summary  Managing diabetes (diabetes mellitus) can be complicated. Your diabetes treatment may be managed by a team of health care providers.  Your health care providers follow guidelines in order  to help you get the best quality of care.  Standards of care including having regular physical exams, blood tests, blood pressure monitoring, immunizations, screening tests, and education about how to manage your diabetes.  Your health care providers may also give you more specific instructions based on your individual health. This information is not intended to replace advice given to you by your health care provider. Make sure you discuss any questions you have with your health care provider. Document Released: 10/06/2009 Document Revised: 08/28/2018 Document Reviewed: 09/06/2016 Elsevier Interactive Patient Education  2019 [employer].  Follow a Healthy Eating Plan - You can do it! Limit sugary drinks.  Avoid sodas, sweet tea, sport or energy drinks, or fruit drinks.  Drink water, lo-fat milk, or diet drinks. Limit snack foods.   Cut back on candy, cake, cookies, chips, ice cream.  These are a special treat, only in small amounts. Eat plenty of vegetables.  Especially dark green, red, and orange vegetables. Aim for at least 3 servings a day. More is better! Include fruit in your daily diet.  Whole fruit is much healthier than fruit juice! Limit "white" bread, "white" pasta, "white" rice.   Choose "100% whole grain" products, [place] or wild rice. Avoid fatty meats. Try "Meatless Monday" and choose eggs  or beans one day a week.  When eating meat, choose lean meats like chicken, [place], and fish.  Grill, broil, or bake meats instead of frying, and eat poultry without the skin. Eat less salt.  Avoid frozen pizzas, frozen dinners and salty foods.  Use seasonings other than salt in cooking.  This can help blood pressure and keep you from swelling Beer, wine and liquor have calories.  If you can safely drink alcohol, limit to 1 drink per day for women, 2 drinks for men

## 2019-02-04 NOTE — Progress Notes (Signed)
Patient ID: Whitney Benton, female    DOB: February 06, 1971  MRN: [id number]  CC: New Patient (Initial Visit) and Diabetes   Subjective: Whitney Benton is a 48 y.o. female who presents for new pt visit.  Her father is with her today.   Her concerns today include:  Pt with hx of tob dep, depression, mixed incontinence, DM  Previous PCP was Dr. Berdine Addison at [facility].  Pt states she was discgh from the clinic due to lack of f/u.  Pt states she was being followed for depression and was on Cymbalta.  Last seen in 2015.    DIABETES TYPE 2  Last A1C:   Lab Results  Component Value Date   HGBA1C 11.7 (H) 01/06/2019   Results for orders placed or performed in visit on 02/04/19  POCT glucose (manual entry)  Result Value Ref Range   POC Glucose 204 (A) 70 - 99 mg/dl   Dx with DM last mth at [facility]  Endorses frequent thrist and urination at the time but this has improved now that she is on medication. + blurred vision.  Was prescribed Lantus but could not afford so started on Meformin which she is taking Med Adherence:  _0  Yes    _1  No Medication side effects:  _2  Yes, sharp abdominal pains lasting 10-15 mins after taking Metformin.  No bloating or diarrhea  Home Monitoring?  _3  Yes    _4  No.  No device. Home glucose results range: Diet Adherence: _5  Yes    _6  No Exercise: _7  Yes    _8  No Hypoglycemic episodes?: _9  Yes    _10  No Numbness of the feet? _11  Yes    _12  No but numbness in the tips of middle finger LT, middle and thumb on RT Retinopathy hx? _13  Yes    _14  No Last eye exam:  12/2018, Dr. Schuyler Amor Comments:   Tob dep:  Use to smoke 1 pk/Q4 days, now down to 3 a day.  Plans to quit.  HM: declines flu.  Will be due for tdap in 2023.  Declines Pneumovax  Family history, social history and surgical histories reviewed.  Patient Active Problem List   Diagnosis Date Noted  . Left arm weakness 12/06/2014  . Left arm pain 12/06/2014  . Paresthesias/numbness 12/06/2014  . Left hand weakness  11/14/2014  . Tobacco abuse 11/14/2014  . Hypokalemia 11/14/2014  . Depression   . Mixed incontinence 06/27/2014  . History of TVH in 2006 for fibroids and menorrhagia; benign pathology 09/08/2011     Current Outpatient Medications on File Prior to Visit  Medication Sig Dispense Refill  . clotrimazole (LOTRIMIN) 1 % cream Apply to affected area 2 times daily 60 g 0  . ondansetron (ZOFRAN-ODT) 4 MG disintegrating tablet Take 1 tablet (4 mg total) by mouth every 8 (eight) hours as needed for nausea or vomiting. 15 tablet 0   No current facility-administered medications on file prior to visit.     Allergies  Allergen Reactions  . Aspirin Hives  . Oxycodone Nausea And Vomiting    Social History   Socioeconomic History  . Marital status: Single    Spouse name: Not on file  . Number of children: 3  . Years of education: 33  . Highest education level: Not on file  Occupational History  . Not on file  Social Needs  . Financial resource strain: Not on file  . Food insecurity:    Worry: Not on file  Inability: Not on file  . Transportation needs:    Medical: Not on file    Non-medical: Not on file  Tobacco Use  . Smoking status: Current Every Day Smoker    Packs/day: 0.25    Years: 8.00    Pack years: 2.00    Types: Cigarettes  . Smokeless tobacco: Never Used  Substance and Sexual Activity  . Alcohol use: Yes    Comment: occasional  . Drug use: No  . Sexual activity: Not on file  Lifestyle  . Physical activity:    Days per week: Not on file    Minutes per session: Not on file  . Stress: Not on file  Relationships  . Social connections:    Talks on phone: Not on file    Gets together: Not on file    Attends religious service: Not on file    Active member of club or organization: Not on file    Attends meetings of clubs or organizations: Not on file    Relationship status: Not on file  . Intimate partner violence:    Fear of current or ex partner: Not on file      Emotionally abused: Not on file    Physically abused: Not on file    Forced sexual activity: Not on file  Other Topics Concern  . Not on file  Social History Narrative   Patient lives at home with mother and father    Patient has 3 children    Patient is single   Patient has 14 years of education    Patient is right handed     Family History  Problem Relation Age of Onset  . Diabetes Mother   . Mental illness Mother   . Depression Mother   . Hypertension Mother   . Hypertension Father   . Diabetes Father     Past Surgical History:  Procedure Laterality Date  . CESAREAN SECTION    . VAGINAL HYSTERECTOMY  2006   Fibroids, menorrhagia, benign pathology    ROS: Review of Systems  Constitutional: Negative for activity change and appetite change.  Eyes: Positive for visual disturbance.  Respiratory: Negative for cough and shortness of breath.   Cardiovascular: Negative for chest pain and palpitations.  Psychiatric/Behavioral: The patient is not nervous/anxious.        Depression screen positive today but patient states that this is not a major issue for her and she does not feel that she needs to be back on medication for it.    PHYSICAL EXAM: BP 112/76   Pulse 78   Temp 97.9 F (36.6 C) (Oral)   Resp 16   Ht _0  (1.626 m)   Wt 199 lb 9.6 oz (90.5 kg)   SpO2 97%   BMI 34.26 kg/m   Wt Readings from Last 3 Encounters:  02/04/19 199 lb 9.6 oz (90.5 kg)  09/17/18 205 lb (93 kg)  12/18/17 194 lb (88 kg)    Physical Exam  General appearance - alert, well appearing, middle-aged African-American female and in no distress Mental status - normal mood, behavior, speech, dress, motor activity, and thought processes Eyes - pupils equal and reactive, extraocular eye movements intact Mouth - mucous membranes moist, pharynx normal without lesions Neck - supple, no significant adenopathy Chest - clear to auscultation, no wheezes, rales or rhonchi, symmetric air  entry Heart - normal rate, regular rhythm, normal S1, S2, no murmurs, rubs, clicks or gallops Extremities - peripheral pulses normal, no  pedal edema, no clubbing or cyanosis Diabetic Foot Exam - Simple   Simple Foot Form Visual Inspection See comments:  Yes Sensation Testing See comments:  Yes Pulse Check Posterior Tibialis and Dorsalis pulse intact bilaterally:  Yes Comments Skin on both heels are thick and cracked.  He also has some patchy thickness on the ball of the right foot involving the third and fourth digit.      CMP Latest Ref Rng & Units 01/06/2019 12/18/2017 01/15/2017  Glucose 70 - 99 mg/dL 329(H) 138(H) 106(H)  BUN 6 - 20 mg/dL _0 Creatinine 0.44 - 1.00 mg/dL 0.80 0.89 0.81  Sodium 135 - 145 mmol/L 138 139 141  Potassium 3.5 - 5.1 mmol/L 3.8 3.8 3.7  Chloride 98 - 111 mmol/L 100 105 107  CO2 22 - 32 mmol/L - 27 29  Calcium 8.9 - 10.3 mg/dL - 8.7(L) 9.2  Total Protein 6.5 - 8.1 g/dL - 6.8 7.3  Total Bilirubin 0.3 - 1.2 mg/dL - 0.8 0.4  Alkaline Phos 38 - 126 U/L - 61 66  AST 15 - 41 U/L - 24 19  ALT 14 - 54 U/L - 18 14    ASSESSMENT AND PLAN: 1. New onset type 2 diabetes mellitus ([place]) Discussed the importance of healthy eating habits, regular aerobic exercise (at least 150 minutes a week as tolerated) and medication compliance to achieve or maintain control of diabetes. -Dietary counseling given and printed materials given. -Prescription sent to pharmacy for diabetic testing supplies.  Advised to check blood sugars twice a day before breakfast and dinner and bring in readings in 2 to 3 weeks to meet with the clinical pharmacist for titration on medication. Change metformin to extended release to see if she tolerates it better. Add Trulicity. - POCT glucose (manual entry) - Microalbumin / creatinine urine ratio - Blood Glucose Monitoring Suppl (TRUE METRIX METER) w/Device KIT; Check blood sugars three times a day  Dispense: 1 kit; Refill: 0 - glucose blood  test strip; Check blood sugars three times a day  Dispense: 100 each; Refill: 12 - TRUEPLUS LANCETS 28G MISC; Check blood sugars three time a day  Dispense: 100 each; Refill: 12 - CBC - Lipid panel - Comprehensive metabolic panel - Dulaglutide (TRULICITY) 7.35 HG/9.9ME SOPN; Inject 0.75 mg into the skin once a week.  Dispense: 4 pen; Refill: 6 - metFORMIN (GLUCOPHAGE XR) 500 MG 24 hr tablet; Take 1 tablet (500 mg total) by mouth 2 (two) times daily.  Dispense: 60 tablet; Refill: 3  2. Obesity (BMI 30-39.9) See #1 above.  3. Tobacco dependence Patient advised to quit smoking. Discussed health risks associated with smoking including lung and other types of cancers, chronic lung diseases and CV risks.. Pt ready to give trail of quitting.  Discussed methods to help quit including quitting cold [place], use of NRT, Chantix and Bupropion.  Patient would like to try the nicotine patches.  However she has limited finances.  I have given her the information about 1 800 quit now so that she can call and request the patches and gum. 3 minutes spent on counseling.  4. Influenza vaccination declined   5. 23-polyvalent pneumococcal polysaccharide vaccine declined   Patient was given the opportunity to ask questions.  Patient verbalized understanding of the plan and was able to repeat key elements of the plan.   Orders Placed This Encounter  Procedures  . Microalbumin / creatinine urine ratio  . CBC  . Lipid panel  . Comprehensive  metabolic panel  . POCT glucose (manual entry)     Requested Prescriptions   Signed Prescriptions Disp Refills  . Blood Glucose Monitoring Suppl (TRUE METRIX METER) w/Device KIT 1 kit 0    Sig: Check blood sugars three times a day  . glucose blood test strip 100 each 12    Sig: Check blood sugars three times a day  . TRUEPLUS LANCETS 28G MISC 100 each 12    Sig: Check blood sugars three time a day  . Dulaglutide (TRULICITY) 9.93 TT/0.1XB SOPN 4 pen 6    Sig:  Inject 0.75 mg into the skin once a week.  . metFORMIN (GLUCOPHAGE XR) 500 MG 24 hr tablet 60 tablet 3    Sig: Take 1 tablet (500 mg total) by mouth 2 (two) times daily.    Return in about 7 weeks (around 03/25/2019).  Karle Plumber, MD, FACP

## 2019-02-04 NOTE — Progress Notes (Signed)
cbg 204 

## 2019-02-05 ENCOUNTER — Other Ambulatory Visit: Payer: Self-pay | Admitting: Internal Medicine

## 2019-02-05 LAB — COMPREHENSIVE METABOLIC PANEL
A/G RATIO: 1.8 (ref 1.2–2.2)
ALBUMIN: 4.6 g/dL (ref 3.8–4.8)
ALT: 12 IU/L (ref 0–32)
AST: 16 IU/L (ref 0–40)
Alkaline Phosphatase: 126 IU/L — ABNORMAL HIGH (ref 39–117)
BUN/Creatinine Ratio: 14 (ref 9–23)
BUN: 12 mg/dL (ref 6–24)
CHLORIDE: 101 mmol/L (ref 96–106)
CO2: 26 mmol/L (ref 20–29)
Calcium: 9.9 mg/dL (ref 8.7–10.2)
Creatinine, Ser: 0.86 mg/dL (ref 0.57–1.00)
GFR calc non Af Amer: 80 mL/min/{1.73_m2} (ref >59)
GFR, EST AFRICAN AMERICAN: 92 mL/min/{1.73_m2} (ref >59)
Globulin, Total: 2.6 g/dL (ref 1.5–4.5)
Glucose: 171 mg/dL — ABNORMAL HIGH (ref 65–99)
Potassium: 4.4 mmol/L (ref 3.5–5.2)
Sodium: 143 mmol/L (ref 134–144)
TOTAL PROTEIN: 7.2 g/dL (ref 6.0–8.5)

## 2019-02-05 LAB — MICROALBUMIN / CREATININE URINE RATIO
CREATININE, UR: 148.3 mg/dL
MICROALBUM., U, RANDOM: 6.2 ug/mL
Microalb/Creat Ratio: 4 mg/g creat (ref 0–29)

## 2019-02-05 LAB — LIPID PANEL
CHOL/HDL RATIO: 4.6 ratio — AB (ref 0.0–4.4)
Cholesterol, Total: 198 mg/dL (ref 100–199)
HDL: 43 mg/dL (ref >39)
LDL Calculated: 124 mg/dL — ABNORMAL HIGH (ref 0–99)
TRIGLYCERIDES: 153 mg/dL — AB (ref 0–149)
VLDL CHOLESTEROL CAL: 31 mg/dL (ref 5–40)

## 2019-02-05 LAB — CBC
HEMATOCRIT: 39.5 % (ref 34.0–46.6)
Hemoglobin: 13.6 g/dL (ref 11.1–15.9)
MCH: 28.5 pg (ref 26.6–33.0)
MCHC: 34.4 g/dL (ref 31.5–35.7)
MCV: 83 fL (ref 79–97)
PLATELETS: 381 10*3/uL (ref 150–450)
RBC: 4.77 x10E6/uL (ref 3.77–5.28)
RDW: 13.3 % (ref 11.7–15.4)
WBC: 6.5 10*3/uL (ref 3.4–10.8)

## 2019-02-05 MED ORDER — ATORVASTATIN CALCIUM 20 MG PO TABS: 20.0000 mg | ORAL_TABLET | Freq: Every day | ORAL | 3 refills | Status: DC

## 2019-02-08 MED FILL — ATORVASTATIN 20 MG TABLET: 20 | 30 days supply | Qty: 30 | Fill #0

## 2019-02-10 ENCOUNTER — Telehealth: Payer: Self-pay

## 2019-02-10 NOTE — Telephone Encounter (Signed)
Contacted pt to go over lab results pt is aware and doesn't have any questions or concerns 

## 2019-02-16 MED FILL — TRULICITY 0.75 MG/0.5 ML PE: 0.75 | 28 days supply | Qty: 2 | Fill #1

## 2019-02-25 ENCOUNTER — Ambulatory Visit: Payer: Medicaid Other | Attending: Family Medicine | Admitting: Pharmacist

## 2019-02-25 ENCOUNTER — Encounter: Payer: Self-pay | Admitting: Pharmacist

## 2019-02-25 ENCOUNTER — Encounter: Payer: Self-pay | Admitting: Internal Medicine

## 2019-02-25 DIAGNOSIS — E119 Type 2 diabetes mellitus without complications: Secondary | ICD-10-CM

## 2019-02-25 LAB — GLUCOSE, POCT (MANUAL RESULT ENTRY): POC Glucose: 134 mg/dl — AB (ref 70–99)

## 2019-02-25 NOTE — Progress Notes (Signed)
S:     PCP: Dr. Wynetta Emery  No chief complaint on file.  49 YO AA female with PMH of T2DM, obesity, and tobacco abuse.   Patient arrives in poor spirits. Presents for diabetes management at the request of Dr. Wynetta Emery. Today, pt reports "not feeling good" since Monday. Denies respiratory symptoms or fever. Endorses "upset stomach" but denies NV, diarrhea. Of note, pt was referred by Dr. Wynetta Emery on 02/04/2019. At that visit, patient reported sharp abdominal pains lasting 10-15 mins after taking regular release metformin. Dr. Wynetta Emery switched to XR metformin and started Trulicity.  Of note, pt seen in UC 01/06/2019 and dx with DM after reporting symptoms of hyperglycemia and having an A1c taken that was resulted at 11.7.  Patient reports diabetes was diagnosed recently.   Family/Social History:  - FHx: DM (father, mother) - Tobacco: current every day smoker (3 to 4 cigarettes a day) - Alcohol: uses occasionally   Insurance coverage/medication affordability:  - Family Planning Medicaid  - No rx drug coverage  Patient reports adherence with medications.  Current diabetes medications include:  - Metformin 242 mg XR BID - Trulicity 6.83 mg weekly  Patient denies hypoglycemic events. Does report increase in fatigue.   Patient reported dietary habits:  - Pt reports limiting sweets  - Reports increasing intake of vegetables and water  Patient-reported exercise habits:  - Does not exercise outside of work - Does report "walking a lot" at work   Patient denies polyuria, polydipsia.  Patient reports some numbness in her feet. Patient reports denies visual changes.  O:  POCT glucose: 134 Home fasting CBG: 120s  2 hour post-prandial/random CBG: low 100s  Lab Results  Component Value Date   HGBA1C 11.7 (H) 01/06/2019   There were no vitals filed for this visit.  Lipid Panel     Component Value Date/Time   CHOL 198 02/04/2019 1445   TRIG 153 (H) 02/04/2019 1445   HDL 43  02/04/2019 1445   CHOLHDL 4.6 (H) 02/04/2019 1445   CHOLHDL 3.2 11/14/2014 0707   VLDL 19 11/14/2014 0707   LDLCALC 124 (H) 02/04/2019 1445   Clinical ASCVD: No  The 10-year ASCVD risk score Mikey Bussing DC Jr., et al., 2013) is: 6%   Values used to calculate the score:     Age: 18 years     Sex: Female     Is Non-Hispanic African American: Yes     Diabetic: Yes     Tobacco smoker: Yes     Systolic Blood Pressure: 419 mmHg     Is BP treated: No     HDL Cholesterol: 43 mg/dL     Total Cholesterol: 198 mg/dL   A/P: Diabetes longstanding currently uncontrolled based on A1c but pt's home CBGs have improved. Today's value at goal as patient is not fasting. Patient is able to verbalize appropriate hypoglycemia management plan. Patient is adherent with medication.  With her symptoms, will have patient hold metformin and follow-up with PCP tomorrow. She injects Trulicity on Sundays, however, this may need to be discontinued depending on the physician's assessment tomorrow. Pt is complaining of abdominal pain and GLP-1 RA carry the risk of pancreatitis.   -Hold metformin until further instructed by Dr. Wynetta Emery.  -Consider d/c of Trulicity   -Extensively discussed pathophysiology of DM, recommended lifestyle interventions, dietary effects on glycemic control -Counseled on s/sx of and management of hypoglycemia -HM: pt is due Pneumovax; refuses today -Next A1C anticipated 03/2019.   ASCVD risk - primary  prevention in patient with DM. Last LDL is not controlled. ASCVD risk score is not >20%  - moderate intensity statin indicated.  -Continued atorvastatin 20 mg.   Written patient instructions provided. Total time in face to face counseling 30 minutes.   Follow up with PCP tomorrow.   Patient seen with: Bobbye Riggs, PharmD Candidate  [employer]  Class of 2022  Benard Halsted, PharmD, [place] [phone]

## 2019-02-25 NOTE — Patient Instructions (Signed)
Thank you for coming to see me today. Please do the following:  1. Don't take metformin until further instructed by Dr. Wynetta Emery.  2. Continue checking blood sugars at home.  3. Continue making the lifestyle changes we've discussed together during our visit. Diet and exercise play a significant role in improving your blood sugars.  4. Follow-up with Dr. Wynetta Emery tomorrow.   Hypoglycemia or low blood sugar:   Low blood sugar can happen quickly and may become an emergency if not treated right away.   While this shouldn't happen often, it can be brought upon if you skip a meal or do not eat enough. Also, if your insulin or other diabetes medications are dosed too high, this can cause your blood sugar to go to low.   Warning signs of low blood sugar include: 1. Feeling shaky or dizzy 2. Feeling weak or tired  3. Excessive hunger 4. Feeling anxious or upset  5. Sweating even when you aren't exercising  What to do if I experience low blood sugar? 1. Check your blood sugar with your meter. If lower than 70, proceed to step 2.  2. Treat with 3-4 glucose tablets or 3 packets of regular sugar. If these aren't around, you can try hard candy. Yet another option would be to drink 4 ounces of fruit juice or 6 ounces of REGULAR soda.  3. Re-check your sugar in 15 minutes. If it is still below 70, do what you did in step 2 again. If has come back up, go ahead and eat a snack or small meal at this time.

## 2019-02-26 ENCOUNTER — Encounter: Payer: Self-pay | Admitting: Internal Medicine

## 2019-02-26 ENCOUNTER — Other Ambulatory Visit: Payer: Self-pay | Admitting: Pharmacist

## 2019-02-26 ENCOUNTER — Ambulatory Visit (HOSPITAL_COMMUNITY)
Admission: RE | Admit: 2019-02-26 | Discharge: 2019-02-26 | Disposition: A | Discharge status: Home / Self Care | Payer: Medicaid Other | Source: Ambulatory Visit | Attending: Internal Medicine | Admitting: Internal Medicine

## 2019-02-26 ENCOUNTER — Ambulatory Visit: Payer: Self-pay | Attending: Internal Medicine | Admitting: Internal Medicine

## 2019-02-26 VITALS — BP 122/85 | HR 93 | Temp 98.4°F | Resp 16 | Wt 198.4 lb

## 2019-02-26 DIAGNOSIS — E119 Type 2 diabetes mellitus without complications: Secondary | ICD-10-CM

## 2019-02-26 DIAGNOSIS — R109 Unspecified abdominal pain: Secondary | ICD-10-CM | POA: Insufficient documentation

## 2019-02-26 MED ORDER — IOHEXOL 300 MG/ML  SOLN
100.0000 mL | Freq: Once | INTRAMUSCULAR | Status: AC | PRN
  Administered 2019-02-26: 100 mL via INTRAVENOUS

## 2019-02-26 MED ORDER — PEN NEEDLES 31G X 6 MM MISC: 0 refills | Status: DC

## 2019-02-26 MED ORDER — INSULIN GLARGINE 100 UNIT/ML SOLOSTAR PEN: 10.0000 [IU] | PEN_INJECTOR | Freq: Every day | SUBCUTANEOUS | 0 refills | Status: DC

## 2019-02-26 MED FILL — TRUEPLUS PEN NDL 31G X 1/4": 31G X 6 MM | 100 days supply | Qty: 100 | Fill #0

## 2019-02-26 MED FILL — !LANTUS SOLOSTAR 100UNITS/M: 100 | 30 days supply | Qty: 3 | Fill #0

## 2019-02-26 MED FILL — TRUEPLUS PEN NDL 31G X 1/4: 31G X 6 MM | 100 days supply | Qty: 100 | Fill #0

## 2019-02-26 NOTE — Patient Instructions (Signed)
Hold off on taking metformin for 48 hours after having the CAT scan of your abdomen. Hold off on taking Trulicity.

## 2019-02-26 NOTE — Progress Notes (Signed)
Patient ID: Whitney Benton, female    DOB: [birth year]  MRN: [id number]  CC: Abdominal Pain   Subjective: Whitney Benton is a 48 y.o. female who presents for UC visit.  Her concerns today include:  Pt with hx of tob dep, depression, mixed incontinence, DM  "My stomach has been giving me a fit" x 5 days. Very bad constant pain mainly across the midsection of the abdomen that fluctuates in intensity. No initiating factors.  Some nausea yesterday otherwise no nausea or vomiting, or diarrhea Moving bowels okay.  Last BM was this morning No worse or better with food.  No worse or better with metformin.  She was started on Trulicity on visit 06/20/4764.  Saw the clinical pharmacist yesterday who told her to hold Trulicity and metformin.  Blood sugars today were in the 120s No dysuria.   Rates pain 10/10 She was out of work for the last 4 days because of the pain.   Patient Active Problem List   Diagnosis Date Noted  . New onset type 2 diabetes mellitus ([place]) 02/04/2019  . Obesity (BMI 30-39.9) 02/04/2019  . Tobacco dependence 02/04/2019  . Left arm weakness 12/06/2014  . Paresthesias/numbness 12/06/2014  . Tobacco abuse 11/14/2014  . Depression   . Mixed incontinence 06/27/2014  . History of TVH in 2006 for fibroids and menorrhagia; benign pathology 09/08/2011     Current Outpatient Medications on File Prior to Visit  Medication Sig Dispense Refill  . atorvastatin (LIPITOR) 20 MG tablet Take 1 tablet (20 mg total) by mouth daily. 90 tablet 3  . Blood Glucose Monitoring Suppl (TRUE METRIX METER) w/Device KIT Check blood sugars three times a day 1 kit 0  . clotrimazole (LOTRIMIN) 1 % cream Apply to affected area 2 times daily 60 g 0  . Dulaglutide (TRULICITY) 4.65 KP/5.4SF SOPN Inject 0.75 mg into the skin once a week. 4 pen 6  . glucose blood test strip Check blood sugars three times a day 100 each 12  . metFORMIN (GLUCOPHAGE XR) 500 MG 24 hr tablet Take 1 tablet (500 mg total) by  mouth 2 (two) times daily. 60 tablet 3  . ondansetron (ZOFRAN-ODT) 4 MG disintegrating tablet Take 1 tablet (4 mg total) by mouth every 8 (eight) hours as needed for nausea or vomiting. 15 tablet 0  . TRUEPLUS LANCETS 28G MISC Check blood sugars three time a day 100 each 12   No current facility-administered medications on file prior to visit.     Allergies  Allergen Reactions  . Aspirin Hives  . Oxycodone Nausea And Vomiting    Social History   Socioeconomic History  . Marital status: Single    Spouse name: Not on file  . Number of children: 3  . Years of education: 31  . Highest education level: Not on file  Occupational History  . Not on file  Social Needs  . Financial resource strain: Not on file  . Food insecurity:    Worry: Not on file    Inability: Not on file  . Transportation needs:    Medical: Not on file    Non-medical: Not on file  Tobacco Use  . Smoking status: Current Every Day Smoker    Packs/day: 0.25    Years: 8.00    Pack years: 2.00    Types: Cigarettes  . Smokeless tobacco: Never Used  Substance and Sexual Activity  . Alcohol use: Yes    Comment: occasional  . Drug use: No  .  Sexual activity: Not on file  Lifestyle  . Physical activity:    Days per week: Not on file    Minutes per session: Not on file  . Stress: Not on file  Relationships  . Social connections:    Talks on phone: Not on file    Gets together: Not on file    Attends religious service: Not on file    Active member of club or organization: Not on file    Attends meetings of clubs or organizations: Not on file    Relationship status: Not on file  . Intimate partner violence:    Fear of current or ex partner: Not on file    Emotionally abused: Not on file    Physically abused: Not on file    Forced sexual activity: Not on file  Other Topics Concern  . Not on file  Social History Narrative   Patient lives at home with mother and father    Patient has 3 children     Patient is single   Patient has 14 years of education    Patient is right handed     Family History  Problem Relation Age of Onset  . Diabetes Mother   . Mental illness Mother   . Depression Mother   . Hypertension Mother   . Hypertension Father   . Diabetes Father     Past Surgical History:  Procedure Laterality Date  . CESAREAN SECTION    . VAGINAL HYSTERECTOMY  2006   Fibroids, menorrhagia, benign pathology    ROS: Review of Systems Negative except as stated above  PHYSICAL EXAM: BP 122/85   Pulse 93   Temp 98.4 F (36.9 C) (Oral)   Resp 16   Wt 198 lb 6.4 oz (90 kg)   SpO2 95%   BMI 34.06 kg/m   Physical Exam  General appearance - alert, well appearing, and in no distress Mental status - normal mood, behavior, speech, dress, motor activity, and thought processes Abdomen -obese, nondistended, normal bowel sounds.  Mild tenderness on palpation in the left upper and mid quadrants, the mid abdomen and the right side mid abdomen.  No guarding or rebound  CMP Latest Ref Rng & Units 02/04/2019 01/06/2019 12/18/2017  Glucose 65 - 99 mg/dL 171(H) 329(H) 138(H)  BUN 6 - 24 mg/dL 12 11 8   Creatinine 0.57 - 1.00 mg/dL 0.86 0.80 0.89  Sodium 134 - 144 mmol/L 143 138 139  Potassium 3.5 - 5.2 mmol/L 4.4 3.8 3.8  Chloride 96 - 106 mmol/L 101 100 105  CO2 20 - 29 mmol/L 26 - 27  Calcium 8.7 - 10.2 mg/dL 9.9 - 8.7(L)  Total Protein 6.0 - 8.5 g/dL 7.2 - 6.8  Total Bilirubin 0.0 - 1.2 mg/dL <0.2 - 0.8  Alkaline Phos 39 - 117 IU/L 126(H) - 61  AST 0 - 40 IU/L 16 - 24  ALT 0 - 32 IU/L 12 - 18   Lipid Panel     Component Value Date/Time   CHOL 198 02/04/2019 1445   TRIG 153 (H) 02/04/2019 1445   HDL 43 02/04/2019 1445   CHOLHDL 4.6 (H) 02/04/2019 1445   CHOLHDL 3.2 11/14/2014 0707   VLDL 19 11/14/2014 0707   LDLCALC 124 (H) 02/04/2019 1445    CBC    Component Value Date/Time   WBC 6.5 02/04/2019 1445   WBC 11.7 (H) 12/18/2017 1636   RBC 4.77 02/04/2019 1445   RBC  5.26 (H) 12/18/2017 1636  HGB 13.6 02/04/2019 1445   HCT 39.5 02/04/2019 1445   PLT 381 02/04/2019 1445   MCV 83 02/04/2019 1445   MCH 28.5 02/04/2019 1445   MCH 28.9 12/18/2017 1636   MCHC 34.4 02/04/2019 1445   MCHC 33.6 12/18/2017 1636   RDW 13.3 02/04/2019 1445   LYMPHSABS 2.2 01/15/2017 0925   MONOABS 0.6 01/15/2017 0925   EOSABS 0.2 01/15/2017 0925   BASOSABS 0.0 01/15/2017 0925    ASSESSMENT AND PLAN:  1. Acute abdominal pain Of questionable etiology.  She had ultrasound a few years ago that was negative for gallstones.  She is on Trulicity but pain is not characteristic of pancreatitis.  Nonetheless I agree with holding the Trulicity for now.  We will get some baseline blood tests including a lipase level and refer her for a CAT scan of the abdomen and pelvis today.  She will restart the metformin 48 hours after having her CAT scan done. Agree with having the patient hold Trulicity. - CT Abdomen Pelvis W Contrast - Lipase - Comprehensive metabolic panel - CBC With Differential  2. New onset type 2 diabetes mellitus ([place]) Since we are holding the Trulicity, will put her on Lantus instead.  She will also restart metformin 48 hours after having CAT scan of the abdomen - Insulin Glargine (LANTUS SOLOSTAR) 100 UNIT/ML Solostar Pen; Inject 10 Units into the skin daily.  Dispense: 3 mL; Refill: 0  Patient was given the opportunity to ask questions.  Patient verbalized understanding of the plan and was able to repeat key elements of the plan.   Orders Placed This Encounter  Procedures  . CT Abdomen Pelvis W Contrast  . Lipase  . Comprehensive metabolic panel  . CBC With Differential     Requested Prescriptions   Signed Prescriptions Disp Refills  . Insulin Glargine (LANTUS SOLOSTAR) 100 UNIT/ML Solostar Pen 3 mL 0    Sig: Inject 10 Units into the skin daily.    No follow-ups on file.  Karle Plumber, MD, FACP

## 2019-02-27 ENCOUNTER — Telehealth: Payer: Self-pay | Admitting: Internal Medicine

## 2019-02-27 LAB — CBC WITH DIFFERENTIAL
BASOS ABS: 0 10*3/uL (ref 0.0–0.2)
Basos: 0 %
EOS (ABSOLUTE): 0.2 10*3/uL (ref 0.0–0.4)
Eos: 2 %
HEMOGLOBIN: 12.8 g/dL (ref 11.1–15.9)
Hematocrit: 38.7 % (ref 34.0–46.6)
IMMATURE GRANULOCYTES: 0 %
Immature Grans (Abs): 0 10*3/uL (ref 0.0–0.1)
LYMPHS ABS: 2.8 10*3/uL (ref 0.7–3.1)
Lymphs: 39 %
MCH: 26.8 pg (ref 26.6–33.0)
MCHC: 33.1 g/dL (ref 31.5–35.7)
MCV: 81 fL (ref 79–97)
MONOCYTES: 8 %
Monocytes Absolute: 0.6 10*3/uL (ref 0.1–0.9)
NEUTROS PCT: 51 %
Neutrophils Absolute: 3.6 10*3/uL (ref 1.4–7.0)
RBC: 4.77 x10E6/uL (ref 3.77–5.28)
RDW: 13.6 % (ref 11.7–15.4)
WBC: 7.1 10*3/uL (ref 3.4–10.8)

## 2019-02-27 LAB — COMPREHENSIVE METABOLIC PANEL
A/G RATIO: 1.5 (ref 1.2–2.2)
ALT: 11 IU/L (ref 0–32)
AST: 15 IU/L (ref 0–40)
Albumin: 4.4 g/dL (ref 3.8–4.8)
Alkaline Phosphatase: 112 IU/L (ref 39–117)
BUN/Creatinine Ratio: 15 (ref 9–23)
BUN: 12 mg/dL (ref 6–24)
CHLORIDE: 105 mmol/L (ref 96–106)
CO2: 23 mmol/L (ref 20–29)
Calcium: 9.6 mg/dL (ref 8.7–10.2)
Creatinine, Ser: 0.81 mg/dL (ref 0.57–1.00)
GFR calc non Af Amer: 86 mL/min/{1.73_m2} (ref >59)
GFR, EST AFRICAN AMERICAN: 99 mL/min/{1.73_m2} (ref >59)
Globulin, Total: 3 g/dL (ref 1.5–4.5)
Glucose: 113 mg/dL — ABNORMAL HIGH (ref 65–99)
POTASSIUM: 4.3 mmol/L (ref 3.5–5.2)
SODIUM: 146 mmol/L — AB (ref 134–144)
TOTAL PROTEIN: 7.4 g/dL (ref 6.0–8.5)

## 2019-02-27 LAB — LIPASE: LIPASE: 44 U/L (ref 14–72)

## 2019-02-27 NOTE — Telephone Encounter (Signed)
Phone call placed to patient this a.m.  I left message on her mobile phone letting her know that the CAT scan was normal and her lab results came back okay.  Patient advised to continue the Metformin and Lantus and stay off of the Trulicity for now.

## 2019-03-01 ENCOUNTER — Ambulatory Visit (HOSPITAL_COMMUNITY): Payer: Medicaid Other

## 2019-03-01 ENCOUNTER — Telehealth: Payer: Self-pay | Admitting: Internal Medicine

## 2019-03-01 MED ORDER — OMEPRAZOLE 20 MG PO CPDR: 20.0000 mg | DELAYED_RELEASE_CAPSULE | Freq: Every day | ORAL | 1 refills | Status: DC

## 2019-03-01 NOTE — Telephone Encounter (Signed)
Patient called because she is still experiencing stomach issues from her last visit and would like to be called in something to help. Please follow up.

## 2019-03-01 NOTE — Telephone Encounter (Signed)
Will forward to pcp

## 2019-03-02 MED FILL — OMEPRAZOLE 20 MG CAP: 20 | 30 days supply | Qty: 30 | Fill #0

## 2019-03-02 NOTE — Telephone Encounter (Signed)
Contacted pt to go over Dr. Wynetta Emery message. Pt didn't answer left a detailed vm informing pt of message and if she has any questions or concerns to give me a call

## 2019-03-11 MED FILL — METFORMIN HCL ER 500 MG TAB: 500 | 30 days supply | Qty: 60 | Fill #1

## 2019-03-11 MED FILL — ATORVASTATIN 20 MG TABLET: 20 | 30 days supply | Qty: 30 | Fill #1

## 2019-03-25 ENCOUNTER — Encounter: Payer: Self-pay | Admitting: Internal Medicine

## 2019-03-25 ENCOUNTER — Ambulatory Visit: Payer: Self-pay | Attending: Internal Medicine | Admitting: Internal Medicine

## 2019-03-25 ENCOUNTER — Other Ambulatory Visit: Payer: Self-pay

## 2019-03-25 DIAGNOSIS — F17200 Nicotine dependence, unspecified, uncomplicated: Secondary | ICD-10-CM

## 2019-03-25 DIAGNOSIS — F17210 Nicotine dependence, cigarettes, uncomplicated: Secondary | ICD-10-CM

## 2019-03-25 DIAGNOSIS — IMO0001 Reserved for inherently not codable concepts without codable children: Secondary | ICD-10-CM

## 2019-03-25 DIAGNOSIS — E1165 Type 2 diabetes mellitus with hyperglycemia: Secondary | ICD-10-CM

## 2019-03-25 MED ORDER — INSULIN GLARGINE 100 UNIT/ML SOLOSTAR PEN: 13.0000 [IU] | PEN_INJECTOR | Freq: Every day | SUBCUTANEOUS | 4 refills | Status: DC

## 2019-03-25 MED ORDER — METFORMIN HCL ER 500 MG PO TB24: 500.0000 mg | ORAL_TABLET | Freq: Two times a day (BID) | ORAL | 6 refills | Status: DC

## 2019-03-25 MED FILL — TRUE METRIX TEST STRIP: 30 days supply | Qty: 100 | Fill #1

## 2019-03-25 NOTE — Progress Notes (Signed)
Pt states she has a sore throat that has been going on for 2 weeks   Pt states she hasn't taken anything for it   Pt states she gargles but the discomfort comes back

## 2019-03-25 NOTE — Progress Notes (Signed)
Virtual Visit via Telephone Note  I connected with Whitney Benton on 03/25/19 at 2:53 p.m by telephone from my office  and verified that I am speaking with the correct person using two identifiers. Pt is at home   I discussed the limitations, risks, security and privacy concerns of performing an evaluation and management service by telephone and the availability of in person appointments. I also discussed with the patient that there may be a patient responsible charge related to this service. The patient expressed understanding and agreed to proceed.   History of Present Illness: Pt with hx of tob dep, depression, mixed incontinence, DM, obesity, HL  DM: On last visit we stopped Trulicity due to abdominal pain.  CAT scan of the abdomen was negative for any acute findings.  Lipase was normal.  She is doing better.  Patient was left on Lantus and metformin. Checking BS BID.  Morning BS range in the 170s, range before dinner in the 120s Eating habits: some days appetite decrease but she tries to get in 3 meals a day.  Avoids sugary drinks, less white carbs Exercise: not getting in much exercise outside of work.  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 Did not get a chance to call 1-800-Quit.  She states that she has a lot of stuff going on at home and has been working a lot.   Observations/Objective: No direct observations done as this was a telephone encounter  Assessment and Plan: 1. Diabetes mellitus type 2, uncontrolled, without complications (HCC) Morning blood sugars not at goal.  The goal is 90-130.  I recommend increasing Lantus to 13 units daily.  She will continue metformin at 500 mg twice a day. Commended her for changes that she has made in her eating habits. Encourage her to try and get in some exercise outside of work when she is able.  Aim for 3-4 times a week for 30 minutes. - metFORMIN (GLUCOPHAGE XR) 500 MG 24 hr tablet; Take 1 tablet (500  mg total) by mouth 2 (two) times daily.  Dispense: 60 tablet; Refill: 6 - Insulin Glargine (LANTUS SOLOSTAR) 100 UNIT/ML Solostar Pen; Inject 13 Units into the skin daily.  Dispense: 3 mL; Refill: 4  2. Tobacco dependence Encourage her to quit.  She still sounds interested in wanting to use nicotine patches.  I have encouraged her to call the 1 800 quit now when she gets a moment   Follow Up Instructions: F/u with me in 3 mths   I discussed the assessment and treatment plan with the patient. The patient was provided an opportunity to ask questions and all were answered. The patient agreed with the plan and demonstrated an understanding of the instructions.   The patient was advised to call back or seek an in-person evaluation if the symptoms worsen or if the condition fails to improve as anticipated.  I provided 15 minutes of non-face-to-face time during this encounter.   Karle Plumber, MD

## 2019-03-29 ENCOUNTER — Telehealth: Payer: Self-pay | Admitting: Internal Medicine

## 2019-03-29 NOTE — Telephone Encounter (Signed)
Patient called in regards to her throat states she is needing a note for her to be able to go back to work for her employer. Patient would also like to be prescribed something for her throat.  Patient will call back to provide fax #

## 2019-03-29 NOTE — Telephone Encounter (Signed)
Ill forward to pcp

## 2019-03-29 NOTE — Telephone Encounter (Signed)
Patient called back stating she would like letter emailed to:[email or web address]

## 2019-03-30 MED FILL — !LANTUS SOLOSTAR 100UNITS/M: 100 | 48 days supply | Qty: 6 | Fill #0

## 2019-03-30 MED FILL — METFORMIN HCL ER 500 MG TAB: 500 | 30 days supply | Qty: 60 | Fill #0

## 2019-03-30 NOTE — Telephone Encounter (Signed)
Can you schedule pt an appointment with any provider

## 2019-03-30 NOTE — Telephone Encounter (Signed)
Called and lvm for pt to call back. 

## 2019-03-31 ENCOUNTER — Telehealth: Payer: Self-pay | Admitting: Internal Medicine

## 2019-03-31 ENCOUNTER — Ambulatory Visit: Payer: Self-pay | Attending: Family Medicine | Admitting: Family Medicine

## 2019-03-31 ENCOUNTER — Encounter: Payer: Self-pay | Admitting: Family Medicine

## 2019-03-31 ENCOUNTER — Other Ambulatory Visit: Payer: Self-pay

## 2019-03-31 DIAGNOSIS — J029 Acute pharyngitis, unspecified: Secondary | ICD-10-CM

## 2019-03-31 MED ORDER — CETIRIZINE HCL 10 MG PO TABS: 10.0000 mg | ORAL_TABLET | Freq: Every day | ORAL | 1 refills | Status: DC

## 2019-03-31 MED ORDER — AMOXICILLIN 500 MG PO CAPS: 500.0000 mg | ORAL_CAPSULE | Freq: Three times a day (TID) | ORAL | 0 refills | Status: DC

## 2019-03-31 NOTE — Progress Notes (Signed)
Virtual Visit via Telephone Note  I connected with Whitney Benton on 03/31/19 at 10:30 AM EDT by telephone and verified that I am speaking with the correct person using two identifiers.   I discussed the limitations, risks, security and privacy concerns of performing an evaluation and management service by telephone and the availability of in person appointments. I also discussed with the patient that there may be a patient responsible charge related to this service. The patient expressed understanding and agreed to proceed.   History of Present Illness: Whitney Benton is a 48 year old female with a history of type 2 diabetes mellitus (A1c 11.7 in 12/2018) who is seen today with complaints of sore throat for the last 2 weeks which she describes as a tingling sensation in her throat, pain on swallowing, having to clear her throat frequently with associated otalgia.  Her right neck lymph nodes sore.  She has used lozenges with no improvement in symptoms. Denies fever, cough, myalgias, GI symptoms. 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 Denies history of sick contacts.   Observations/Objective: Awake, alert, oriented x3 Not in acute distress  Assessment and Plan: 1. Pharyngitis, unspecified etiology Protracted course Placed on antibiotics presumptively - cetirizine (ZYRTEC) 10 MG tablet; Take 1 tablet (10 mg total) by mouth daily.  Dispense: 30 tablet; Refill: 1 - amoxicillin (AMOXIL) 500 MG capsule; Take 1 capsule (500 mg total) by mouth 3 (three) times daily.  Dispense: 30 capsule; Refill: 0   Follow Up Instructions: Return for Follow-up of chronic medical conditions, keep previously scheduled appointment with PCP.    I discussed the assessment and treatment plan with the patient. The patient was provided an opportunity to ask questions and all were answered. The patient agreed with the plan and demonstrated an understanding of the instructions.    The patient was advised to call back or seek an in-person evaluation if the symptoms worsen or if the condition fails to improve as anticipated.  I provided 10 minutes of non-face-to-face time during this encounter.   Charlott Rakes, MD

## 2019-03-31 NOTE — Progress Notes (Signed)
Patient has been called and DOB has been verified. Patient has been screened and transferred to PCP to start phone visit.  C/C: sore throat x 2 weeks  Refills: none needed.

## 2019-03-31 NOTE — Telephone Encounter (Signed)
Will route to PCP for review. 

## 2019-03-31 NOTE — Telephone Encounter (Signed)
Patient called stating that the provider who she saw today wrote a letter for her to return to work on 04/02/19. Patient would like the letter to state that she can return to work on Monday 04/05/19 since she will already miss the whole week. . Please f/u

## 2019-04-01 NOTE — Telephone Encounter (Signed)
Patient was called and patient states that she has the letter already.

## 2019-04-01 NOTE — Telephone Encounter (Signed)
Letter is ready for pick-up.

## 2019-05-19 MED FILL — METFORMIN HCL ER 500 MG TAB: 500 | 30 days supply | Qty: 60 | Fill #2

## 2019-05-19 MED FILL — ATORVASTATIN 20 MG TABLET: 20 | 30 days supply | Qty: 30 | Fill #2

## 2019-05-25 ENCOUNTER — Encounter: Payer: Self-pay | Admitting: Internal Medicine

## 2019-05-25 ENCOUNTER — Ambulatory Visit: Payer: Self-pay | Attending: Internal Medicine | Admitting: Internal Medicine

## 2019-05-25 ENCOUNTER — Other Ambulatory Visit: Payer: Self-pay

## 2019-05-25 DIAGNOSIS — G43519 Persistent migraine aura without cerebral infarction, intractable, without status migrainosus: Secondary | ICD-10-CM

## 2019-05-25 MED ORDER — TOPIRAMATE 25 MG PO TABS: 25.0000 mg | ORAL_TABLET | Freq: Every day | ORAL | 1 refills | Status: DC

## 2019-05-25 MED ORDER — SUMATRIPTAN SUCCINATE 50 MG PO TABS: ORAL_TABLET | ORAL | 0 refills | Status: DC

## 2019-05-25 MED FILL — TOPIRAMATE 25 MG TABS: 25 | 30 days supply | Qty: 30 | Fill #0

## 2019-05-25 MED FILL — SUMATRIPTAN SUCC 50 MG TAB: 50 | 23 days supply | Qty: 9 | Fill #0

## 2019-05-25 NOTE — Progress Notes (Signed)
Pt states the pain is located in the back of the head  Pt states her head has been hurting for 3 weeks  Pt states she has taken medicine and it ease up but it comes right back

## 2019-05-25 NOTE — Progress Notes (Signed)
Virtual Visit via Telephone Note Due to current restrictions/limitations of in-office visits due to the COVID-19 pandemic, this scheduled clinical appointment was converted to a telehealth visit  I connected with Whitney Benton on 05/25/19 at 1:40 p.m EDT by telephone and verified that I am speaking with the correct person using two identifiers. I am in my office.  The patient is at home.  Only the patient and myself participated in this encounter.  I discussed the limitations, risks, security and privacy concerns of performing an evaluation and management service by telephone and the availability of in person appointments. I also discussed with the patient that there may be a patient responsible charge related to this service. The patient expressed understanding and agreed to proceed.   History of Present Illness: Pt with hx of tob dep, depression, mixed incontinence, DM, obesity, HL   c/o intermittent HA at back of head x 3 wks -Comes on when she first wakes up in the mornings and last all day -no N/V/blurred vision.  +photophobia and phenophobia.  No numbness in extremities No better with laying down or moving around Checks BP every day using an arm cuff and it has been 116/70-80 HA in past which would start in the back of head and spread to front.  Last episode of that was 4-5 yrs ago.  Dx with migraines in past by PCP whom she was seeing before coming to [place].  She was on med but does not recall the name. "its been so long ago." -She has taken Ibuprofen, Naproxen, Aleve with this current episode.  Takes med Q 4 hrs "and it's not going any where."    Observations/Objective: No direct observation done as this was a telephone encounter.  Assessment and Plan: 1. Migraine aura, persistent, intractable -Headache suggest likely return of migraines Advised to stop the over-the-counter medications that she is using without relief. We will start her on Imitrex to use as needed for abortive  therapy and Topamax for prophylaxis.  Advised that the Topamax may cause some drowsiness Will give short-term in person follow-up - topiramate (TOPAMAX) 25 MG tablet; Take 1 tablet (25 mg total) by mouth at bedtime.  Dispense: 30 tablet; Refill: 1 - SUMAtriptan (IMITREX) 50 MG tablet; Take 1 tab at the start of the headache.  If no improvement, may repeat dose in 2 hours.  Max 2 tabs/24-hour..  Dispense: 10 tablet; Refill: 0   Follow Up Instructions: F/u in 2 wks in person   I discussed the assessment and treatment plan with the patient. The patient was provided an opportunity to ask questions and all were answered. The patient agreed with the plan and demonstrated an understanding of the instructions.   The patient was advised to call back or seek an in-person evaluation if the symptoms worsen or if the condition fails to improve as anticipated.  I provided 10 minutes of non-face-to-face time during this encounter.   Karle Plumber, MD

## 2019-07-01 MED FILL — METFORMIN HCL ER 500 MG TB2: 500 | 30 days supply | Qty: 60 | Fill #3

## 2019-07-01 MED FILL — ATORVASTATIN 20 MG TABLET: 20 | 30 days supply | Qty: 30 | Fill #3

## 2019-07-05 MED FILL — TRUE METRIX TEST STRIP: 30 days supply | Qty: 100 | Fill #2

## 2019-07-09 ENCOUNTER — Ambulatory Visit: Payer: Medicaid Other | Admitting: Internal Medicine

## 2019-07-14 ENCOUNTER — Other Ambulatory Visit: Payer: Self-pay

## 2019-07-14 ENCOUNTER — Ambulatory Visit: Payer: Self-pay | Attending: Family Medicine | Admitting: Physician Assistant

## 2019-07-14 VITALS — BP 129/92 | HR 77 | Temp 98.4°F | Resp 16 | Wt 184.2 lb

## 2019-07-14 DIAGNOSIS — E1165 Type 2 diabetes mellitus with hyperglycemia: Secondary | ICD-10-CM

## 2019-07-14 DIAGNOSIS — IMO0001 Reserved for inherently not codable concepts without codable children: Secondary | ICD-10-CM

## 2019-07-14 DIAGNOSIS — F329 Major depressive disorder, single episode, unspecified: Secondary | ICD-10-CM

## 2019-07-14 DIAGNOSIS — G43519 Persistent migraine aura without cerebral infarction, intractable, without status migrainosus: Secondary | ICD-10-CM

## 2019-07-14 DIAGNOSIS — F32A Depression, unspecified: Secondary | ICD-10-CM

## 2019-07-14 LAB — GLUCOSE, POCT (MANUAL RESULT ENTRY): POC Glucose: 226 mg/dl — AB (ref 70–99)

## 2019-07-14 MED ORDER — TOPIRAMATE 50 MG PO TABS: 50.0000 mg | ORAL_TABLET | Freq: Two times a day (BID) | ORAL | 2 refills | Status: DC

## 2019-07-14 MED ORDER — LANTUS SOLOSTAR 100 UNIT/ML ~~LOC~~ SOPN: 17.0000 [IU] | PEN_INJECTOR | Freq: Every day | SUBCUTANEOUS | 4 refills | Status: DC

## 2019-07-14 MED FILL — TOPIRAMATE 50 MG TABLET: 50 | 30 days supply | Qty: 60 | Fill #0

## 2019-07-14 MED FILL — METFORMIN HCL ER 500 MG TB2: 500 | 30 days supply | Qty: 60 | Fill #3

## 2019-07-14 MED FILL — !LANTUS SOLOSTAR 100UNITS/M: 100 | 17 days supply | Qty: 3 | Fill #0

## 2019-07-14 NOTE — Patient Instructions (Signed)
Drink 80-100 ounces water daily.  Eliminate sugars from your diet.

## 2019-07-14 NOTE — Progress Notes (Signed)
Whitney Benton, is a 48 y.o. female  JHE:[id number]  JEH:[id number]  DOB - Mar 30, 1971  Subjective:  Chief Complaint and HPI: Whitney Benton is a 48 y.o. female here today to recheck for HA.  She thinks the topamax may be helping a little.  She has had less frequent HA.  HA have been on sides and back of head.  No f/Benton.  No N/V.  No aura.  No FH aneurysm.  No thunder clapp.    Blood sugars running in low 200s.  She denies s/sx of hyper or hypoglycemia.  She says she is compliant with her meds.  She admits to eating sugar and carbs.    ROS:   Constitutional:  No f/Benton, No night sweats, No unexplained weight loss. EENT:  No vision changes, No blurry vision, No hearing changes. No mouth, throat, or ear problems.  Respiratory: No cough, No SOB Cardiac: No CP, no palpitations GI:  No abd pain, No N/V/D. GU: No Urinary s/sx Musculoskeletal: No joint pain Neuro: + headache, no dizziness, no motor weakness.  Skin: No rash Endocrine:  No polydipsia. No polyuria.  Psych: Denies SI/HI  No problems updated.  ALLERGIES: Allergies  Allergen Reactions  . Aspirin Hives  . Oxycodone Nausea And Vomiting    PAST MEDICAL HISTORY: Past Medical History:  Diagnosis Date  . Anxiety   . Depression   . Trichomonas infection     MEDICATIONS AT HOME: Prior to Admission medications   Medication Sig Start Date End Date Taking? Authorizing Provider  atorvastatin (LIPITOR) 20 MG tablet Take 1 tablet (20 mg total) by mouth daily. 02/05/19   Whitney Pier, MD  Blood Glucose Monitoring Suppl (TRUE METRIX METER) w/Device KIT Check blood sugars three times a day 02/04/19   Whitney Pier, MD  cetirizine (ZYRTEC) 10 MG tablet Take 1 tablet (10 mg total) by mouth daily. 03/31/19   Whitney Rakes, MD  clotrimazole (LOTRIMIN) 1 % cream Apply to affected area 2 times daily 09/30/18   Wieters, Kingsbury C, PA-Benton  glucose blood test strip Check blood sugars three times a day 02/04/19   Whitney Pier, MD  Insulin  Glargine (LANTUS SOLOSTAR) 100 UNIT/ML Solostar Pen Inject 17 Units into the skin daily. 07/14/19   Whitney Donovan, PA-Benton  Insulin Pen Needle (PEN NEEDLES) 31G X 6 MM MISC Use as directed 02/26/19   Whitney Pier, MD  metFORMIN (GLUCOPHAGE XR) 500 MG 24 hr tablet Take 1 tablet (500 mg total) by mouth 2 (two) times daily. 03/25/19   Whitney Pier, MD  omeprazole (PRILOSEC) 20 MG capsule Take 1 capsule (20 mg total) by mouth daily. 03/01/19   Whitney Pier, MD  ondansetron (ZOFRAN-ODT) 4 MG disintegrating tablet Take 1 tablet (4 mg total) by mouth every 8 (eight) hours as needed for nausea or vomiting. Patient not taking: Reported on 05/25/2019 01/11/19   Whitney Kick, MD  SUMAtriptan (IMITREX) 50 MG tablet Take 1 tab at the start of the headache.  If no improvement, may repeat dose in 2 hours.  Max 2 tabs/24-hour.. 05/25/19   Whitney Pier, MD  topiramate (TOPAMAX) 50 MG tablet Take 1 tablet (50 mg total) by mouth 2 (two) times daily. 07/14/19   Whitney Donovan, PA-Benton  TRUEPLUS LANCETS 28G MISC Check blood sugars three time a day 02/04/19   Whitney Pier, MD     Objective:  EXAM:   Vitals:   07/14/19 1035  BP: (!) 129/92  Pulse:  77  Resp: 16  Temp: 98.4 F (36.9 Benton)  TempSrc: Oral  SpO2: 98%  Weight: 184 lb 3.2 oz (83.6 kg)    General appearance : A&OX3. NAD. Non-toxic-appearing HEENT: Atraumatic and Normocephalic.  PERRLA. EOM intact.  TM clear B. Mouth-MMM, post pharynx WNL w/o erythema, No PND. Neck: supple, no JVD. No cervical lymphadenopathy. No thyromegaly Chest/Lungs:  Breathing-non-labored, Good air entry bilaterally, breath sounds normal without rales, rhonchi, or wheezing  CVS: S1 S2 regular, no murmurs, gallops, rubs  Extremities: Bilateral Lower Ext shows no edema, both legs are warm to touch with = pulse throughout Neurology:  CN II-XII grossly intact, Non focal.  Finger to nose and heel to shin intact Psych:  TP linear. J/I fair. Normal speech.  Appropriate eye contact and blunted/flat affect.  Skin:  No Rash  Data Review Lab Results  Component Value Date   HGBA1C 11.7 (H) 01/06/2019   HGBA1C 5.8 (H) 11/14/2014     Assessment & Plan   1. Diabetes mellitus type 2, uncontrolled, without complications (HCC) Uncontrolled-will increase lantus from 13 to 17 units at bedtime.  Eliminate sugars and white carbohydrates.  Continue other medications - Glucose (CBG) - Comprehensive metabolic panel - Hemoglobin A1c - CBC with Differential/Platelet - Insulin Glargine (LANTUS SOLOSTAR) 100 UNIT/ML Solostar Pen; Inject 17 Units into the skin daily.  Dispense: 3 mL; Refill: 4  2. Migraine aura, persistent, intractable Improving-increase dose of topamax - topiramate (TOPAMAX) 50 MG tablet; Take 1 tablet (50 mg total) by mouth 2 (two) times daily.  Dispense: 30 tablet; Refill: 2 - Comprehensive metabolic panel - TSH  3. Depression, unspecified depression type Patient does not wish to be on meds. Counseled on self-care.  She has good support.  Denies SI/HI.   - TSH - Vitamin D, 25-hydroxy   Patient have been counseled extensively about nutrition and exercise  Return in about 3 months (around 10/14/2019) for Whitney Benton.  The patient was given clear instructions to go to ER or return to medical center if symptoms don't improve, worsen or new problems develop. The patient verbalized understanding. The patient was told to call to get lab results if they haven't heard anything in the next week.     Whitney Caldron, PA-Benton [facility] [place], [place]   07/14/2019, 12:02 PMPatient ID: Whitney Benton, female   DOB: 01-13-1971, 48 y.o.   MRN: [id number]

## 2019-07-15 ENCOUNTER — Other Ambulatory Visit: Payer: Self-pay | Admitting: Physician Assistant

## 2019-07-15 ENCOUNTER — Telehealth: Payer: Self-pay | Admitting: Emergency Medicine

## 2019-07-15 LAB — CBC WITH DIFFERENTIAL/PLATELET
Basophils Absolute: 0 10*3/uL (ref 0.0–0.2)
Basos: 0 %
EOS (ABSOLUTE): 0.1 10*3/uL (ref 0.0–0.4)
Eos: 2 %
Hematocrit: 40.5 % (ref 34.0–46.6)
Hemoglobin: 13.6 g/dL (ref 11.1–15.9)
Immature Grans (Abs): 0 10*3/uL (ref 0.0–0.1)
Immature Granulocytes: 0 %
Lymphocytes Absolute: 2.4 10*3/uL (ref 0.7–3.1)
Lymphs: 44 %
MCH: 28.5 pg (ref 26.6–33.0)
MCHC: 33.6 g/dL (ref 31.5–35.7)
MCV: 85 fL (ref 79–97)
Monocytes Absolute: 0.4 10*3/uL (ref 0.1–0.9)
Monocytes: 7 %
Neutrophils Absolute: 2.6 10*3/uL (ref 1.4–7.0)
Neutrophils: 47 %
Platelets: 343 10*3/uL (ref 150–450)
RBC: 4.78 x10E6/uL (ref 3.77–5.28)
RDW: 13.1 % (ref 11.7–15.4)
WBC: 5.5 10*3/uL (ref 3.4–10.8)

## 2019-07-15 LAB — COMPREHENSIVE METABOLIC PANEL
ALT: 10 IU/L (ref 0–32)
AST: 11 IU/L (ref 0–40)
Albumin/Globulin Ratio: 1.7 (ref 1.2–2.2)
Albumin: 4.3 g/dL (ref 3.8–4.8)
Alkaline Phosphatase: 140 IU/L — ABNORMAL HIGH (ref 39–117)
BUN/Creatinine Ratio: 10 (ref 9–23)
BUN: 8 mg/dL (ref 6–24)
Bilirubin Total: 0.2 mg/dL (ref 0.0–1.2)
CO2: 26 mmol/L (ref 20–29)
Calcium: 9.7 mg/dL (ref 8.7–10.2)
Chloride: 102 mmol/L (ref 96–106)
Creatinine, Ser: 0.82 mg/dL (ref 0.57–1.00)
GFR calc Af Amer: 98 mL/min/{1.73_m2} (ref >59)
GFR calc non Af Amer: 85 mL/min/{1.73_m2} (ref >59)
Globulin, Total: 2.6 g/dL (ref 1.5–4.5)
Glucose: 239 mg/dL — ABNORMAL HIGH (ref 65–99)
Potassium: 3.7 mmol/L (ref 3.5–5.2)
Sodium: 142 mmol/L (ref 134–144)
Total Protein: 6.9 g/dL (ref 6.0–8.5)

## 2019-07-15 LAB — HEMOGLOBIN A1C
Est. average glucose Bld gHb Est-mCnc: 326 mg/dL
Hgb A1c MFr Bld: 13 % — ABNORMAL HIGH (ref 4.8–5.6)

## 2019-07-15 LAB — VITAMIN D 25 HYDROXY (VIT D DEFICIENCY, FRACTURES): Vit D, 25-Hydroxy: 22.2 ng/mL — ABNORMAL LOW (ref 30.0–100.0)

## 2019-07-15 LAB — TSH: TSH: 0.893 u[IU]/mL (ref 0.450–4.500)

## 2019-07-15 MED ORDER — VITAMIN D (ERGOCALCIFEROL) 1.25 MG (50000 UNIT) PO CAPS: 50000.0000 [IU] | ORAL_CAPSULE | ORAL | 0 refills | Status: DC

## 2019-07-15 MED FILL — VIT D2 1.25 MG (50,000 UNIT: 1.25 MG | 28 days supply | Qty: 4 | Fill #0

## 2019-07-15 NOTE — Telephone Encounter (Signed)
Patient called.  Patient contacted via phone to be given results of labs.  Patient identified by name and date of birth.  Patient given results of labs.  Patient educated on lab results. Questions answered. Patient acknowledged understanding of labs results.  Patient advised on diet and exercise and that her prescription for Vitamin D was at the pharmacy.  Patient acknowledged understanding of advice.

## 2019-08-06 ENCOUNTER — Ambulatory Visit: Payer: Self-pay | Attending: Internal Medicine | Admitting: Internal Medicine

## 2019-08-06 ENCOUNTER — Encounter: Payer: Self-pay | Admitting: Internal Medicine

## 2019-08-06 DIAGNOSIS — F32A Depression, unspecified: Secondary | ICD-10-CM

## 2019-08-06 DIAGNOSIS — E1165 Type 2 diabetes mellitus with hyperglycemia: Secondary | ICD-10-CM

## 2019-08-06 DIAGNOSIS — F329 Major depressive disorder, single episode, unspecified: Secondary | ICD-10-CM

## 2019-08-06 DIAGNOSIS — F17200 Nicotine dependence, unspecified, uncomplicated: Secondary | ICD-10-CM

## 2019-08-06 DIAGNOSIS — Z6379 Other stressful life events affecting family and household: Secondary | ICD-10-CM | POA: Insufficient documentation

## 2019-08-06 DIAGNOSIS — IMO0001 Reserved for inherently not codable concepts without codable children: Secondary | ICD-10-CM

## 2019-08-06 MED ORDER — DULOXETINE HCL 20 MG PO CPEP: 20.0000 mg | ORAL_CAPSULE | Freq: Every day | ORAL | 3 refills | Status: DC

## 2019-08-06 MED ORDER — LANTUS SOLOSTAR 100 UNIT/ML ~~LOC~~ SOPN: 20.0000 [IU] | PEN_INJECTOR | Freq: Every day | SUBCUTANEOUS | 4 refills | Status: DC

## 2019-08-06 MED FILL — !LANTUS SOLOSTAR 100UNITS/M: 100 | 30 days supply | Qty: 6 | Fill #0

## 2019-08-06 MED FILL — DULoxetine HCL 20 MG CPEP: 20 | 30 days supply | Qty: 30 | Fill #0

## 2019-08-06 NOTE — Progress Notes (Signed)
Pt states she hasn't been able to keep food on her stomach  Pt states she eats enough to take her medicine

## 2019-08-06 NOTE — Progress Notes (Signed)
Virtual Visit via Telephone Note Due to current restrictions/limitations of in-office visits due to the COVID-19 pandemic, this scheduled clinical appointment was converted to a telehealth visit  I connected with Whitney Benton on 08/06/19 at 11:56 a.m by telephone and verified that I am speaking with the correct person using two identifiers. I am in my office.  The patient is at home.  Only the patient and myself participated in this encounter.  I discussed the limitations, risks, security and privacy concerns of performing an evaluation and management service by telephone and the availability of in person appointments. I also discussed with the patient that there may be a patient responsible charge related to this service. The patient expressed understanding and agreed to proceed.   History of Present Illness: Pt with hx of tob dep, depression, mixed incontinence, DM, obesity, HL, vit D def  I last evaluated patient 05/2019 when she was complaining of headaches.  Assessed to have migraines.  I placed her on Imitrex and Topamax.  She followed up with PA on 07/14/2019 and reports that headaches were less.  Topamax was increased to 50 mg daily.  Blood test done at that time revealed A1c of 13.  Patient was advised to increase Lantus to 17 units daily.  DM:  Pt reports compliance with Lantus 17 units daily and Metformin Reports BS have fluctuated; checking TID.  A.m BS readings: 244, 179, 246,137, 129, 155, and 165 this morning; lunch time reading - 280, 136, 271, 133, 177, 112, 226; before dinner - 147, 240, 137, 286, 120. -reports decrease appetite; "everything taste nasty to me.  The only thing that taste right to me is water."  Denies any nausea or vomiting.   Wgh was 199 but now she thinks she is in the 180s.  I'm just stressed out and worried."  She loss her job, has bills, and BS difficult to control.  She feels depress and has withdrawn from people.  Feels she needs to be on med.   Reports being  on Cymbalta in the past and did well with it.   Tob dep: "I don't even smoke like that anymore."  Down to 2 a day.  "If I am not stressed I smoke less."  Outpatient Encounter Medications as of 08/06/2019  Medication Sig  . atorvastatin (LIPITOR) 20 MG tablet Take 1 tablet (20 mg total) by mouth daily.  . Blood Glucose Monitoring Suppl (TRUE METRIX METER) w/Device KIT Check blood sugars three times a day  . cetirizine (ZYRTEC) 10 MG tablet Take 1 tablet (10 mg total) by mouth daily.  . clotrimazole (LOTRIMIN) 1 % cream Apply to affected area 2 times daily (Patient not taking: Reported on 08/06/2019)  . glucose blood test strip Check blood sugars three times a day  . Insulin Glargine (LANTUS SOLOSTAR) 100 UNIT/ML Solostar Pen Inject 17 Units into the skin daily.  . Insulin Pen Needle (PEN NEEDLES) 31G X 6 MM MISC Use as directed  . metFORMIN (GLUCOPHAGE XR) 500 MG 24 hr tablet Take 1 tablet (500 mg total) by mouth 2 (two) times daily.  Marland Kitchen omeprazole (PRILOSEC) 20 MG capsule Take 1 capsule (20 mg total) by mouth daily.  . ondansetron (ZOFRAN-ODT) 4 MG disintegrating tablet Take 1 tablet (4 mg total) by mouth every 8 (eight) hours as needed for nausea or vomiting. (Patient not taking: Reported on 05/25/2019)  . SUMAtriptan (IMITREX) 50 MG tablet Take 1 tab at the start of the headache.  If no improvement, may  repeat dose in 2 hours.  Max 2 tabs/24-hour..  . topiramate (TOPAMAX) 50 MG tablet Take 1 tablet (50 mg total) by mouth 2 (two) times daily.  . TRUEPLUS LANCETS 28G MISC Check blood sugars three time a day  . Vitamin D, Ergocalciferol, (DRISDOL) 1.25 MG (50000 UT) CAPS capsule Take 1 capsule (50,000 Units total) by mouth every 7 (seven) days.   No facility-administered encounter medications on file as of 08/06/2019.       Observations/Objective: Depression screen [facility] 2/9 07/14/2019 02/26/2019 02/04/2019  Decreased Interest 1 - -  Down, Depressed, Hopeless 3 0 -  PHQ - 2 Score 4 0 -  Altered  sleeping 3 3 3   Tired, decreased energy 3 3 3   Change in appetite 3 3 2   Feeling bad or failure about yourself  1 0 0  Trouble concentrating 0 0 0  Moving slowly or fidgety/restless 0 0 0  Suicidal thoughts 0 0 0  PHQ-9 Score 14 9 -   Results for orders placed or performed in visit on 07/14/19  Comprehensive metabolic panel  Result Value Ref Range   Glucose 239 (H) 65 - 99 mg/dL   BUN 8 6 - 24 mg/dL   Creatinine, Ser 0.82 0.57 - 1.00 mg/dL   GFR calc non Af Amer 85 >59 mL/min/1.73   GFR calc Af Amer 98 >59 mL/min/1.73   BUN/Creatinine Ratio 10 9 - 23   Sodium 142 134 - 144 mmol/L   Potassium 3.7 3.5 - 5.2 mmol/L   Chloride 102 96 - 106 mmol/L   CO2 26 20 - 29 mmol/L   Calcium 9.7 8.7 - 10.2 mg/dL   Total Protein 6.9 6.0 - 8.5 g/dL   Albumin 4.3 3.8 - 4.8 g/dL   Globulin, Total 2.6 1.5 - 4.5 g/dL   Albumin/Globulin Ratio 1.7 1.2 - 2.2   Bilirubin Total 0.2 0.0 - 1.2 mg/dL   Alkaline Phosphatase 140 (H) 39 - 117 IU/L   AST 11 0 - 40 IU/L   ALT 10 0 - 32 IU/L  TSH  Result Value Ref Range   TSH 0.893 0.450 - 4.500 uIU/mL  Vitamin D, 25-hydroxy  Result Value Ref Range   Vit D, 25-Hydroxy 22.2 (L) 30.0 - 100.0 ng/mL  Hemoglobin A1c  Result Value Ref Range   Hgb A1c MFr Bld 13.0 (H) 4.8 - 5.6 %   Est. average glucose Bld gHb Est-mCnc 326 mg/dL  CBC with Differential/Platelet  Result Value Ref Range   WBC 5.5 3.4 - 10.8 x10E3/uL   RBC 4.78 3.77 - 5.28 x10E6/uL   Hemoglobin 13.6 11.1 - 15.9 g/dL   Hematocrit 40.5 34.0 - 46.6 %   MCV 85 79 - 97 fL   MCH 28.5 26.6 - 33.0 pg   MCHC 33.6 31.5 - 35.7 g/dL   RDW 13.1 11.7 - 15.4 %   Platelets 343 150 - 450 x10E3/uL   Neutrophils 47 Not Estab. %   Lymphs 44 Not Estab. %   Monocytes 7 Not Estab. %   Eos 2 Not Estab. %   Basos 0 Not Estab. %   Neutrophils Absolute 2.6 1.4 - 7.0 x10E3/uL   Lymphocytes Absolute 2.4 0.7 - 3.1 x10E3/uL   Monocytes Absolute 0.4 0.1 - 0.9 x10E3/uL   EOS (ABSOLUTE) 0.1 0.0 - 0.4 x10E3/uL    Basophils Absolute 0.0 0.0 - 0.2 x10E3/uL   Immature Granulocytes 0 Not Estab. %   Immature Grans (Abs) 0.0 0.0 - 0.1 x10E3/uL  Glucose (CBG)  Result Value Ref Range   POC Glucose 226 (A) 70 - 99 mg/dl     Assessment and Plan: 1. Diabetes mellitus type 2, uncontrolled, without complications ([place]) -Patient does have wide fluctuation in blood sugars. -Dietary counseling given. -Increase Lantus to 20 units daily.  Patient asked about being put back on Trulicity however I told her that that would probably not be a good idea if she is finding that she has decreased appetite already and sometimes not able to hold food down.  We had her on Trulicity or Victoza initially and it was stopped for abdominal pain and nausea - Insulin Glargine (LANTUS SOLOSTAR) 100 UNIT/ML Solostar Pen; Inject 20 Units into the skin daily.  Dispense: 3 mL; Refill: 4  2. Depression, unspecified depression type Patient is agreeable to being placed on Cymbalta which she reports she was on in the past and did well.  I told her that it will take about 4 to 6 weeks before she starts to feel better on the medication.  Let us know if there is any increased depression while on the medication.  I will have our LCSW follow-up with her. - DULoxetine (CYMBALTA) 20 MG capsule; Take 1 capsule (20 mg total) by mouth daily.  Dispense: 30 capsule; Refill: 3  3. Stressful life events affecting family and household Discussed ways to help reduce stress including exercise. Our LCSW will follow up with her.  4. Tobacco dependence Commended her on cutting back.  She feels that she is able to quit.  Encouraged her to set a quit date.  Less than 5 minutes spent on counseling.   Follow Up Instructions: F/u in 1 mth in person    I discussed the assessment and treatment plan with the patient. The patient was provided an opportunity to ask questions and all were answered. The patient agreed with the plan and demonstrated an understanding of the  instructions.   The patient was advised to call back or seek an in-person evaluation if the symptoms worsen or if the condition fails to improve as anticipated.  I provided 15 minutes of non-face-to-face time during this encounter.   Karle Plumber, MD

## 2019-08-09 MED FILL — ATORVASTATIN 20 MG TABLET: 20 | 30 days supply | Qty: 30 | Fill #3

## 2019-08-18 ENCOUNTER — Other Ambulatory Visit: Payer: Self-pay | Admitting: Internal Medicine

## 2019-08-18 DIAGNOSIS — IMO0001 Reserved for inherently not codable concepts without codable children: Secondary | ICD-10-CM

## 2019-08-18 MED FILL — METFORMIN HCL ER 500 MG TB2: 500 | 30 days supply | Qty: 60 | Fill #0

## 2019-08-25 ENCOUNTER — Telehealth: Payer: Self-pay | Admitting: *Deleted

## 2019-08-25 NOTE — Telephone Encounter (Signed)
Medical Assistant left message on patient's home and cell voicemail. Voicemail states to give a call back to Willow Shidler with [facility] at [phone]. !!!Please schedule a Nurse or CPP appointment for vaccines!!! 

## 2019-08-26 ENCOUNTER — Telehealth: Payer: Self-pay | Admitting: Licensed Clinical Social Worker

## 2019-08-26 NOTE — Telephone Encounter (Signed)
Call placed to patient to follow up with [facility] consult from PCP. LCSW introduced self to patient and explained role at [facility]   Pt reports she is doing well stating, "I'm not as stressed as I was or worried" She was able to fill the medication (Cymbalta) .   LCSW commended pt on compliance and discussed other strategies for managing mental health. Pt stated that she would prefer to have LCSW follow up in a couple of weeks to determine if she should be referred for psychotherapy. No additional concerns noted.

## 2019-09-07 MED FILL — VIT D2 1.25 MG (50,000 UNIT: 1.25 MG | 28 days supply | Qty: 4 | Fill #1

## 2019-09-07 MED FILL — TRUE METRIX TEST STRIP: 30 days supply | Qty: 100 | Fill #3

## 2019-09-07 MED FILL — ?ATORVASTATIN 20 MG TABLET: 20 | 30 days supply | Qty: 30 | Fill #4

## 2019-09-07 MED FILL — TRUEplus LANCETS 28G MISC: 30 days supply | Qty: 100 | Fill #1

## 2019-09-07 MED FILL — DULoxetine HCL 20 MG CPEP: 20 | 30 days supply | Qty: 30 | Fill #1

## 2019-09-07 MED FILL — METFORMIN HCL ER 500 MG TB2: 500 | 30 days supply | Qty: 60 | Fill #1

## 2019-09-26 ENCOUNTER — Encounter (HOSPITAL_COMMUNITY): Payer: Self-pay

## 2019-09-26 ENCOUNTER — Other Ambulatory Visit: Payer: Self-pay

## 2019-09-26 ENCOUNTER — Ambulatory Visit (HOSPITAL_COMMUNITY)
Admission: EM | Admit: 2019-09-26 | Discharge: 2019-09-26 | Disposition: A | Discharge status: Home / Self Care | Payer: Medicaid Other | Attending: Family Medicine | Admitting: Family Medicine

## 2019-09-26 ENCOUNTER — Ambulatory Visit (HOSPITAL_COMMUNITY)
Admission: EM | Admit: 2019-09-26 | Discharge: 2019-09-26 | Discharge status: Absent without leave | Payer: Medicaid Other

## 2019-09-26 DIAGNOSIS — Z3202 Encounter for pregnancy test, result negative: Secondary | ICD-10-CM

## 2019-09-26 DIAGNOSIS — N9089 Other specified noninflammatory disorders of vulva and perineum: Secondary | ICD-10-CM | POA: Insufficient documentation

## 2019-09-26 LAB — POCT URINALYSIS DIP (DEVICE)
Bilirubin Urine: NEGATIVE
Glucose, UA: 500 mg/dL — AB
Hgb urine dipstick: NEGATIVE
Ketones, ur: 15 mg/dL — AB
Leukocytes,Ua: NEGATIVE
Nitrite: NEGATIVE
Protein, ur: NEGATIVE mg/dL
Specific Gravity, Urine: <=1.005 (ref 1.005–1.030)
Urobilinogen, UA: 0.2 mg/dL (ref 0.0–1.0)
pH: 5.5 (ref 5.0–8.0)

## 2019-09-26 LAB — POCT PREGNANCY, URINE: Preg Test, Ur: NEGATIVE

## 2019-09-26 NOTE — ED Triage Notes (Signed)
Pt presents to UC w/ c/o spotting since 9/18. Pt reports vaginal irritation and states wiping is difficult due to irritation. Denies odor

## 2019-09-26 NOTE — Discharge Instructions (Addendum)
You do have one small fissure/ skin tear to the vulva which is likely where you get that small amount of bleeding with wiping.  Your vulva is dry, but I do not see any further indications of source of your symptoms.  Will notify you of any positive findings from your vaginal sample and if any changes to treatment are needed.   Please follow up with gynecology for further recommendations.

## 2019-09-26 NOTE — ED Provider Notes (Signed)
[place]    CSN: [id number] Arrival date & time: 09/26/19  1457      History   Chief Complaint No chief complaint on file.   HPI Whitney Benton is a 48 y.o. female.   Whitney Benton presents with complaints of soreness to vulva and vagina which started on 9/18. Pain with wiping after toileting and sometimes notices blood to the toilet paper. No vaginal discharge or odor. Denies  Any previous similar. No specific known sores or lesions. No urinary symptoms. Doesn't have to use a pad or tampon. She is sexually active with one partner and doesn't use condoms, denies concern for stds. History  Of hysterectomy due to fibroids. Doesn't follow with a gynecologist.    ROS per HPI, negative if not otherwise mentioned.      Past Medical History:  Diagnosis Date  . Anxiety   . Depression   . Trichomonas infection     Patient Active Problem List   Diagnosis Date Noted  . Stressful life events affecting family and household 08/06/2019  . New onset type 2 diabetes mellitus ([place]) 02/04/2019  . Obesity (BMI 30-39.9) 02/04/2019  . Tobacco dependence 02/04/2019  . Left arm weakness 12/06/2014  . Paresthesias/numbness 12/06/2014  . Tobacco abuse 11/14/2014  . Depression   . Mixed incontinence 06/27/2014  . History of TVH in 2006 for fibroids and menorrhagia; benign pathology 09/08/2011    Past Surgical History:  Procedure Laterality Date  . CESAREAN SECTION    . VAGINAL HYSTERECTOMY  2006   Fibroids, menorrhagia, benign pathology    OB History    Gravida  5   Para  3   Term  3   Preterm      AB  2   Living  3     SAB  2   TAB  0   Ectopic  0   Multiple  0   Live Births               Home Medications    Prior to Admission medications   Medication Sig Start Date End Date Taking? Authorizing Provider  atorvastatin (LIPITOR) 20 MG tablet Take 1 tablet (20 mg total) by mouth daily. 02/05/19   Ladell Pier, MD  Blood Glucose Monitoring  Suppl (TRUE METRIX METER) w/Device KIT Check blood sugars three times a day 02/04/19   Ladell Pier, MD  cetirizine (ZYRTEC) 10 MG tablet Take 1 tablet (10 mg total) by mouth daily. 03/31/19   Charlott Rakes, MD  DULoxetine (CYMBALTA) 20 MG capsule Take 1 capsule (20 mg total) by mouth daily. 08/06/19   Ladell Pier, MD  glucose blood test strip Check blood sugars three times a day 02/04/19   Ladell Pier, MD  Insulin Glargine (LANTUS SOLOSTAR) 100 UNIT/ML Solostar Pen Inject 20 Units into the skin daily. 08/06/19   Ladell Pier, MD  Insulin Pen Needle (PEN NEEDLES) 31G X 6 MM MISC Use as directed 02/26/19   Ladell Pier, MD  metFORMIN (GLUCOPHAGE-XR) 500 MG 24 hr tablet TAKE 1 TABLET (500 MG TOTAL) BY MOUTH 2 (TWO) TIMES DAILY. 08/18/19   Ladell Pier, MD  omeprazole (PRILOSEC) 20 MG capsule Take 1 capsule (20 mg total) by mouth daily. 03/01/19   Ladell Pier, MD  SUMAtriptan (IMITREX) 50 MG tablet Take 1 tab at the start of the headache.  If no improvement, may repeat dose in 2 hours.  Max 2 tabs/24-hour.. 05/25/19  Ladell Pier, MD  topiramate (TOPAMAX) 50 MG tablet Take 1 tablet (50 mg total) by mouth 2 (two) times daily. 07/14/19   Argentina Donovan, PA-C  TRUEPLUS LANCETS 28G MISC Check blood sugars three time a day 02/04/19   Ladell Pier, MD  Vitamin D, Ergocalciferol, (DRISDOL) 1.25 MG (50000 UT) CAPS capsule Take 1 capsule (50,000 Units total) by mouth every 7 (seven) days. 07/15/19   Argentina Donovan, PA-C    Family History Family History  Problem Relation Age of Onset  . Diabetes Mother   . Mental illness Mother   . Depression Mother   . Hypertension Mother   . Hypertension Father   . Diabetes Father     Social History Social History   Tobacco Use  . Smoking status: Current Every Day Smoker    Packs/day: 0.25    Years: 8.00    Pack years: 2.00    Types: Cigarettes  . Smokeless tobacco: Never Used  Substance Use Topics  . Alcohol  use: Yes    Comment: occasional  . Drug use: No     Allergies   Aspirin and Oxycodone   Review of Systems Review of Systems   Physical Exam Triage Vital Signs ED Triage Vitals  Enc Vitals Group     BP 09/26/19 1514 (!) 131/93     Pulse Rate 09/26/19 1514 80     Resp 09/26/19 1514 16     Temp 09/26/19 1514 98.3 F (36.8 C)     Temp Source 09/26/19 1514 Temporal     SpO2 09/26/19 1514 99 %     Weight --      Height --      Head Circumference --      Peak Flow --      Pain Score 09/26/19 1525 0     Pain Loc --      Pain Edu? --      Excl. in [place]? --    No data found.  Updated Vital Signs BP (!) 131/93 (BP Location: Left Arm)   Pulse 80   Temp 98.3 F (36.8 C) (Temporal)   Resp 16   SpO2 99%    Physical Exam Constitutional:      General: She is not in acute distress.    Appearance: She is well-developed.  Cardiovascular:     Rate and Rhythm: Normal rate.  Pulmonary:     Effort: Pulmonary effort is normal.  Abdominal:     Tenderness: There is no abdominal tenderness.  Genitourinary:      Comments: Skin tear noted to right labia at skin fold; vulva appears very dry; no other specific point soreness, abscess presence; vagina is moist with thin white discharge, no bleeding or blood visualized Skin:    General: Skin is warm and dry.  Neurological:     Mental Status: She is alert and oriented to person, place, and time.      UC Treatments / Results  Labs (all labs ordered are listed, but only abnormal results are displayed) Labs Reviewed  POCT URINALYSIS DIP (DEVICE) - Abnormal; Notable for the following components:      Result Value   Glucose, UA 500 (*)    Ketones, ur 15 (*)    All other components within normal limits  POCT PREGNANCY, URINE  CERVICOVAGINAL ANCILLARY ONLY    EKG   Radiology No results found.  Procedures Procedures (including critical care time)  Medications Ordered in UC Medications - No  data to display  Initial  Impression / Assessment and Plan / UC Course  I have reviewed the triage vital signs and the nursing notes.  Pertinent labs & imaging results that were available during my care of the patient were reviewed by me and considered in my medical decision making (see chart for details).     Vulvar fissure noted, this is the only obvious source of bleeding with wiping. Suspect dryness contributing to soreness. Vaginal cytology collected and pending. Will notify of any positive findings and if any changes to treatment are needed.  Encouraged follow up with gyne for further evaluation and treatment if sy mptoms persist. Patient verbalized understanding and agreeable to plan.   Final Clinical Impressions(s) / UC Diagnoses   Final diagnoses:  Vulvar irritation  Vulvar fissure     Discharge Instructions     You do have one small fissure/ skin tear to the vulva which is likely where you get that small amount of bleeding with wiping.  Your vulva is dry, but I do not see any further indications of source of your symptoms.  Will notify you of any positive findings from your vaginal sample and if any changes to treatment are needed.   Please follow up with gynecology for further recommendations.    ED Prescriptions    None     PDMP not reviewed this encounter.   Zigmund Gottron, NP 09/26/19 [phone]

## 2019-09-28 LAB — CERVICOVAGINAL ANCILLARY ONLY
Bacterial vaginitis: POSITIVE — AB
Candida vaginitis: POSITIVE — AB
Chlamydia: NEGATIVE
Neisseria Gonorrhea: NEGATIVE
Trichomonas: POSITIVE — AB

## 2019-09-29 ENCOUNTER — Telehealth (HOSPITAL_COMMUNITY): Payer: Self-pay | Admitting: Emergency Medicine

## 2019-09-29 MED ORDER — FLUCONAZOLE 150 MG PO TABS: 150.0000 mg | ORAL_TABLET | Freq: Once | ORAL | 0 refills | Status: AC

## 2019-09-29 MED ORDER — METRONIDAZOLE 500 MG PO TABS: 500.0000 mg | ORAL_TABLET | Freq: Two times a day (BID) | ORAL | 0 refills | Status: DC

## 2019-09-29 NOTE — Telephone Encounter (Signed)
Trichomonas is positive. Rx  for Flagyl was sent to the pharmacy of record. Pt needs education to refrain from sexual intercourse for 7 days to give the medicine time to work. Sexual partners need to be notified and tested/treated. Condoms may reduce risk of reinfection. Recheck for further evaluation if symptoms are not improving.   Bacterial vaginosis is positive. This was not treated at the urgent care visit.  Flagyl 500 mg BID x 7 days #14 no refills sent to patients pharmacy of choice.    Test for candida (yeast) was positive.  Prescription for fluconazole 150mg  po now, repeat dose in 3d if needed, #2 no refills, sent to the pharmacy of record.  Recheck or followup with PCP for further evaluation if symptoms are not improving.    Patient contacted and made aware of    results, all questions answered

## 2019-10-02 ENCOUNTER — Ambulatory Visit (HOSPITAL_COMMUNITY)
Admission: EM | Admit: 2019-10-02 | Discharge: 2019-10-02 | Disposition: A | Discharge status: Home / Self Care | Payer: Medicaid Other | Attending: Family Medicine | Admitting: Family Medicine

## 2019-10-02 ENCOUNTER — Other Ambulatory Visit: Payer: Self-pay

## 2019-10-02 ENCOUNTER — Inpatient Hospital Stay (HOSPITAL_COMMUNITY)
Admission: EM | Admit: 2019-10-02 | Discharge: 2019-10-04 | DRG: 638 | Disposition: A | Discharge status: Home / Self Care | Payer: Self-pay | Attending: Internal Medicine | Admitting: Internal Medicine

## 2019-10-02 ENCOUNTER — Observation Stay (HOSPITAL_COMMUNITY): Payer: Self-pay

## 2019-10-02 ENCOUNTER — Emergency Department (HOSPITAL_COMMUNITY): Payer: Self-pay

## 2019-10-02 ENCOUNTER — Encounter (HOSPITAL_COMMUNITY): Payer: Self-pay

## 2019-10-02 ENCOUNTER — Telehealth (HOSPITAL_COMMUNITY): Payer: Self-pay

## 2019-10-02 DIAGNOSIS — E875 Hyperkalemia: Secondary | ICD-10-CM

## 2019-10-02 DIAGNOSIS — R7989 Other specified abnormal findings of blood chemistry: Secondary | ICD-10-CM

## 2019-10-02 DIAGNOSIS — E871 Hypo-osmolality and hyponatremia: Secondary | ICD-10-CM | POA: Diagnosis present

## 2019-10-02 DIAGNOSIS — R000 Tachycardia, unspecified: Secondary | ICD-10-CM | POA: Insufficient documentation

## 2019-10-02 DIAGNOSIS — R945 Abnormal results of liver function studies: Secondary | ICD-10-CM

## 2019-10-02 DIAGNOSIS — F329 Major depressive disorder, single episode, unspecified: Secondary | ICD-10-CM | POA: Diagnosis present

## 2019-10-02 DIAGNOSIS — F17210 Nicotine dependence, cigarettes, uncomplicated: Secondary | ICD-10-CM | POA: Diagnosis present

## 2019-10-02 DIAGNOSIS — Z886 Allergy status to analgesic agent status: Secondary | ICD-10-CM

## 2019-10-02 DIAGNOSIS — E1169 Type 2 diabetes mellitus with other specified complication: Secondary | ICD-10-CM

## 2019-10-02 DIAGNOSIS — Z1159 Encounter for screening for other viral diseases: Secondary | ICD-10-CM

## 2019-10-02 DIAGNOSIS — F419 Anxiety disorder, unspecified: Secondary | ICD-10-CM | POA: Diagnosis present

## 2019-10-02 DIAGNOSIS — E1010 Type 1 diabetes mellitus with ketoacidosis without coma: Principal | ICD-10-CM | POA: Diagnosis present

## 2019-10-02 DIAGNOSIS — Z818 Family history of other mental and behavioral disorders: Secondary | ICD-10-CM

## 2019-10-02 DIAGNOSIS — R739 Hyperglycemia, unspecified: Secondary | ICD-10-CM | POA: Insufficient documentation

## 2019-10-02 DIAGNOSIS — D72829 Elevated white blood cell count, unspecified: Secondary | ICD-10-CM

## 2019-10-02 DIAGNOSIS — Z794 Long term (current) use of insulin: Secondary | ICD-10-CM

## 2019-10-02 DIAGNOSIS — E860 Dehydration: Secondary | ICD-10-CM | POA: Diagnosis present

## 2019-10-02 DIAGNOSIS — Z20828 Contact with and (suspected) exposure to other viral communicable diseases: Secondary | ICD-10-CM | POA: Diagnosis present

## 2019-10-02 DIAGNOSIS — Z79899 Other long term (current) drug therapy: Secondary | ICD-10-CM

## 2019-10-02 DIAGNOSIS — Z3490 Encounter for supervision of normal pregnancy, unspecified, unspecified trimester: Secondary | ICD-10-CM

## 2019-10-02 DIAGNOSIS — Z885 Allergy status to narcotic agent status: Secondary | ICD-10-CM

## 2019-10-02 DIAGNOSIS — Z90710 Acquired absence of both cervix and uterus: Secondary | ICD-10-CM

## 2019-10-02 DIAGNOSIS — R112 Nausea with vomiting, unspecified: Secondary | ICD-10-CM | POA: Insufficient documentation

## 2019-10-02 DIAGNOSIS — N179 Acute kidney failure, unspecified: Secondary | ICD-10-CM | POA: Diagnosis present

## 2019-10-02 DIAGNOSIS — Z833 Family history of diabetes mellitus: Secondary | ICD-10-CM

## 2019-10-02 DIAGNOSIS — E0810 Diabetes mellitus due to underlying condition with ketoacidosis without coma: Secondary | ICD-10-CM

## 2019-10-02 DIAGNOSIS — E1110 Type 2 diabetes mellitus with ketoacidosis without coma: Secondary | ICD-10-CM

## 2019-10-02 LAB — CBC WITH DIFFERENTIAL/PLATELET
Abs Immature Granulocytes: 0.32 10*3/uL — ABNORMAL HIGH (ref 0.00–0.07)
Basophils Absolute: 0.1 10*3/uL (ref 0.0–0.1)
Basophils Relative: 0 %
Eosinophils Absolute: 0 10*3/uL (ref 0.0–0.5)
Eosinophils Relative: 0 %
HCT: 51.7 % — ABNORMAL HIGH (ref 36.0–46.0)
Hemoglobin: 16.7 g/dL — ABNORMAL HIGH (ref 12.0–15.0)
Immature Granulocytes: 2 %
Lymphocytes Relative: 7 %
Lymphs Abs: 1.2 10*3/uL (ref 0.7–4.0)
MCH: 27.8 pg (ref 26.0–34.0)
MCHC: 32.3 g/dL (ref 30.0–36.0)
MCV: 86.2 fL (ref 80.0–100.0)
Monocytes Absolute: 0.9 10*3/uL (ref 0.1–1.0)
Monocytes Relative: 5 %
Neutro Abs: 14.8 10*3/uL — ABNORMAL HIGH (ref 1.7–7.7)
Neutrophils Relative %: 86 %
Platelets: 445 10*3/uL — ABNORMAL HIGH (ref 150–400)
RBC: 6 MIL/uL — ABNORMAL HIGH (ref 3.87–5.11)
RDW: 13.4 % (ref 11.5–15.5)
WBC: 17.3 10*3/uL — ABNORMAL HIGH (ref 4.0–10.5)
nRBC: 0 % (ref 0.0–0.2)

## 2019-10-02 LAB — COMPREHENSIVE METABOLIC PANEL
ALT: 17 U/L (ref 0–44)
AST: 13 U/L — ABNORMAL LOW (ref 15–41)
Albumin: 5.1 g/dL — ABNORMAL HIGH (ref 3.5–5.0)
Alkaline Phosphatase: 132 U/L — ABNORMAL HIGH (ref 38–126)
Anion gap: 31 — ABNORMAL HIGH (ref 5–15)
BUN: 34 mg/dL — ABNORMAL HIGH (ref 6–20)
CO2: 9 mmol/L — ABNORMAL LOW (ref 22–32)
Calcium: 9.8 mg/dL (ref 8.9–10.3)
Chloride: 91 mmol/L — ABNORMAL LOW (ref 98–111)
Creatinine, Ser: 2.16 mg/dL — ABNORMAL HIGH (ref 0.44–1.00)
GFR calc Af Amer: 30 mL/min — ABNORMAL LOW (ref >60)
GFR calc non Af Amer: 26 mL/min — ABNORMAL LOW (ref >60)
Glucose, Bld: 900 mg/dL (ref 70–99)
Potassium: 6.3 mmol/L (ref 3.5–5.1)
Sodium: 131 mmol/L — ABNORMAL LOW (ref 135–145)
Total Bilirubin: 1.8 mg/dL — ABNORMAL HIGH (ref 0.3–1.2)
Total Protein: 9.1 g/dL — ABNORMAL HIGH (ref 6.5–8.1)

## 2019-10-02 LAB — URINALYSIS, ROUTINE W REFLEX MICROSCOPIC
Bilirubin Urine: NEGATIVE
Glucose, UA: >=500 mg/dL — AB
Ketones, ur: 80 mg/dL — AB
Leukocytes,Ua: NEGATIVE
Nitrite: NEGATIVE
Protein, ur: 100 mg/dL — AB
Specific Gravity, Urine: 1.022 (ref 1.005–1.030)
pH: 5 (ref 5.0–8.0)

## 2019-10-02 LAB — BLOOD GAS, VENOUS
Acid-base deficit: 22.6 mmol/L — ABNORMAL HIGH (ref 0.0–2.0)
Bicarbonate: 8.4 mmol/L — ABNORMAL LOW (ref 20.0–28.0)
FIO2: 21
O2 Saturation: 81.2 %
Patient temperature: 98.6
pCO2, Ven: 32.1 mmHg — ABNORMAL LOW (ref 44.0–60.0)
pH, Ven: 7.047 — CL (ref 7.250–7.430)
pO2, Ven: 59.2 mmHg — ABNORMAL HIGH (ref 32.0–45.0)

## 2019-10-02 LAB — PREGNANCY, URINE: Preg Test, Ur: POSITIVE — AB

## 2019-10-02 LAB — GLUCOSE, CAPILLARY: Glucose-Capillary: >600 mg/dL (ref 70–99)

## 2019-10-02 LAB — CBG MONITORING, ED
Glucose-Capillary: >600 mg/dL (ref 70–99)
Glucose-Capillary: >600 mg/dL (ref 70–99)
Glucose-Capillary: >600 mg/dL (ref 70–99)

## 2019-10-02 LAB — SARS CORONAVIRUS 2 BY RT PCR (HOSPITAL ORDER, PERFORMED IN ~~LOC~~ HOSPITAL LAB): SARS Coronavirus 2: NEGATIVE

## 2019-10-02 LAB — LIPASE, BLOOD: Lipase: 71 U/L — ABNORMAL HIGH (ref 11–51)

## 2019-10-02 LAB — CK: Total CK: 46 U/L (ref 38–234)

## 2019-10-02 LAB — TSH: TSH: 0.014 u[IU]/mL — ABNORMAL LOW (ref 0.350–4.500)

## 2019-10-02 MED ORDER — INSULIN REGULAR(HUMAN) IN NACL 100-0.9 UT/100ML-% IV SOLN
INTRAVENOUS | Status: AC
  Administered 2019-10-03: 10.2 [IU]/h via INTRAVENOUS
  Filled 2019-10-02: qty 100

## 2019-10-02 MED ORDER — SODIUM CHLORIDE 0.9 % IV BOLUS
1000.0000 mL | Freq: Once | INTRAVENOUS | Status: AC
  Administered 2019-10-02: 1000 mL via INTRAVENOUS

## 2019-10-02 MED ORDER — INSULIN REGULAR(HUMAN) IN NACL 100-0.9 UT/100ML-% IV SOLN
INTRAVENOUS | Status: DC
  Administered 2019-10-02: 5.4 [IU]/h via INTRAVENOUS
  Filled 2019-10-02: qty 100

## 2019-10-02 MED ORDER — DIPHENHYDRAMINE HCL 50 MG/ML IJ SOLN
INTRAMUSCULAR | Status: AC
  Filled 2019-10-02: qty 1

## 2019-10-02 MED ORDER — STERILE WATER FOR INJECTION IV SOLN
INTRAVENOUS | Status: DC
  Administered 2019-10-02: via INTRAVENOUS
  Filled 2019-10-02 (×2): qty 850

## 2019-10-02 MED ORDER — NICOTINE 7 MG/24HR TD PT24
7.0000 mg | MEDICATED_PATCH | Freq: Every day | TRANSDERMAL | Status: DC
  Administered 2019-10-03 – 2019-10-04 (×3): 7 mg via TRANSDERMAL
  Filled 2019-10-02 (×5): qty 1

## 2019-10-02 MED ORDER — SODIUM CHLORIDE 0.9 % IV SOLN
INTRAVENOUS | Status: DC
  Administered 2019-10-02 – 2019-10-03 (×2): via INTRAVENOUS

## 2019-10-02 MED ORDER — CALCIUM GLUCONATE-NACL 1-0.675 GM/50ML-% IV SOLN
1.0000 g | Freq: Once | INTRAVENOUS | Status: AC
  Administered 2019-10-02: 1000 mg via INTRAVENOUS
  Filled 2019-10-02: qty 50

## 2019-10-02 MED ORDER — HEPARIN SODIUM (PORCINE) 5000 UNIT/ML IJ SOLN
5000.0000 [IU] | Freq: Three times a day (TID) | INTRAMUSCULAR | Status: DC
  Administered 2019-10-02 – 2019-10-04 (×5): 5000 [IU] via SUBCUTANEOUS
  Filled 2019-10-02 (×7): qty 1

## 2019-10-02 MED ORDER — DEXTROSE-NACL 5-0.45 % IV SOLN
INTRAVENOUS | Status: DC
  Administered 2019-10-03 (×2): via INTRAVENOUS

## 2019-10-02 MED ORDER — DEXTROSE-NACL 5-0.45 % IV SOLN: INTRAVENOUS | Status: DC

## 2019-10-02 NOTE — H&P (Signed)
History and Physical    Whitney Benton IZT:[id number] DOB: Jan 21, 1971 DOA: 10/02/2019  PCP: Ladell Pier, MD  Chief Complaint: Nausea and vomiting  HPI: Whitney Benton is a 48 y.o. female with medical history significant of IDDM, anxiety, depression presenting to the ED with complaints of nausea and vomiting.  Patient initially presented to the urgent care center and was found to be tachycardic and hyperglycemic.  She was transferred to the ED for further evaluation.  Patient states she has been vomiting for the past 4 days and has not been able to keep any food down.  No abdominal pain.  States she normally takes Lantus 20 units daily for her diabetes but has not been using it recently as she has been busy taking care of family member who was admitted to the ICU.  Denies fevers, chills, shortness of breath, or cough.  No other complaints.  ED Course: Tachycardic and tachypneic.  Afebrile.  White blood cell count 17.3.  Blood glucose 900.  Bicarb 9, anion gap 31.  UA with evidence of glucosuria and ketones.  Potassium 6.3.  EKG with peaked T waves.  BUN 34, creatinine 2.1.  Baseline creatinine 0.8.  SARS-CoV-2 test pending.  Alk phos 132, previously mildly elevated as well.  AST and ALT grossly normal.  T bili 1.8, was previously normal.  Lipase 71.  Urine pregnancy test positive.  CK normal.  TSH low at 0.014. Patient was started on insulin infusion and received 2 L normal saline boluses.  VBG with pH 7.0.  Chest x-ray showing no active disease.  Review of Systems:  All systems reviewed and apart from history of presenting illness, are negative.  Past Medical History:  Diagnosis Date  . Anxiety   . Depression   . Trichomonas infection     Past Surgical History:  Procedure Laterality Date  . CESAREAN SECTION    . VAGINAL HYSTERECTOMY  2006   Fibroids, menorrhagia, benign pathology     reports that she has been smoking cigarettes. She has a 2.00 pack-year smoking history. She has  never used smokeless tobacco. She reports current alcohol use. She reports that she does not use drugs.  Allergies  Allergen Reactions  . Aspirin Hives  . Oxycodone Nausea And Vomiting    Family History  Problem Relation Age of Onset  . Diabetes Mother   . Mental illness Mother   . Depression Mother   . Hypertension Mother   . Hypertension Father   . Diabetes Father     Prior to Admission medications   Medication Sig Start Date End Date Taking? Authorizing Provider  atorvastatin (LIPITOR) 20 MG tablet Take 1 tablet (20 mg total) by mouth daily. 02/05/19  Yes Ladell Pier, MD  cetirizine (ZYRTEC) 10 MG tablet Take 1 tablet (10 mg total) by mouth daily. 03/31/19  Yes Charlott Rakes, MD  DULoxetine (CYMBALTA) 20 MG capsule Take 1 capsule (20 mg total) by mouth daily. 08/06/19  Yes Ladell Pier, MD  Insulin Glargine (LANTUS SOLOSTAR) 100 UNIT/ML Solostar Pen Inject 20 Units into the skin daily. 08/06/19  Yes Ladell Pier, MD  metFORMIN (GLUCOPHAGE-XR) 500 MG 24 hr tablet TAKE 1 TABLET (500 MG TOTAL) BY MOUTH 2 (TWO) TIMES DAILY. 08/18/19  Yes Ladell Pier, MD  metroNIDAZOLE (FLAGYL) 500 MG tablet Take 1 tablet (500 mg total) by mouth 2 (two) times daily for 7 days. 09/29/19 10/06/19 Yes Raylene Everts, MD  omeprazole (PRILOSEC) 20 MG capsule Take  1 capsule (20 mg total) by mouth daily. 03/01/19  Yes Ladell Pier, MD  SUMAtriptan (IMITREX) 50 MG tablet Take 1 tab at the start of the headache.  If no improvement, may repeat dose in 2 hours.  Max 2 tabs/24-hour.. 05/25/19  Yes Ladell Pier, MD  topiramate (TOPAMAX) 50 MG tablet Take 1 tablet (50 mg total) by mouth 2 (two) times daily. 07/14/19  Yes Argentina Donovan, PA-C  Vitamin D, Ergocalciferol, (DRISDOL) 1.25 MG (50000 UT) CAPS capsule Take 1 capsule (50,000 Units total) by mouth every 7 (seven) days. 07/15/19  Yes Argentina Donovan, PA-C  Blood Glucose Monitoring Suppl (TRUE METRIX METER) w/Device KIT Check  blood sugars three times a day 02/04/19   Karle Plumber B, MD  glucose blood test strip Check blood sugars three times a day 02/04/19   Ladell Pier, MD  Insulin Pen Needle (PEN NEEDLES) 31G X 6 MM MISC Use as directed 02/26/19   Ladell Pier, MD  TRUEPLUS LANCETS 28G MISC Check blood sugars three time a day 02/04/19   Ladell Pier, MD    Physical Exam: Vitals:   10/02/19 1734 10/02/19 1900 10/02/19 2000 10/02/19 2030  BP: 109/78 118/61 132/76 128/77  Pulse: (!) 135 (!) 134 (!) 136 (!) 136  Resp: (!) 26 18 (!) 21 18  Temp:      TempSrc:      SpO2: 98% 98% 99% 99%    Physical Exam  Constitutional: She is oriented to person, place, and time. She appears well-developed and well-nourished.  Appears lethargic  HENT:  Head: Normocephalic.  Very dry mucous membranes  Eyes: Right eye exhibits no discharge. Left eye exhibits no discharge.  Neck: Neck supple.  Cardiovascular: Regular rhythm and intact distal pulses.  Tachycardic  Pulmonary/Chest: She has no wheezes. She has no rales.  Tachypneic  Abdominal: Soft. Bowel sounds are normal. She exhibits no distension. There is no abdominal tenderness. There is no guarding.  Musculoskeletal:        General: No edema.  Neurological: She is alert and oriented to person, place, and time.  Skin: Skin is warm and dry.     Labs on Admission: I have personally reviewed following labs and imaging studies  CBC: Recent Labs  Lab 10/02/19 1738  WBC 17.3*  NEUTROABS 14.8*  HGB 16.7*  HCT 51.7*  MCV 86.2  PLT 185*   Basic Metabolic Panel: Recent Labs  Lab 10/02/19 1738  NA 131*  K 6.3*  CL 91*  CO2 9*  GLUCOSE 900*  BUN 34*  CREATININE 2.16*  CALCIUM 9.8   GFR: CrCl cannot be calculated (Unknown ideal weight.). Liver Function Tests: Recent Labs  Lab 10/02/19 1738  AST 13*  ALT 17  ALKPHOS 132*  BILITOT 1.8*  PROT 9.1*  ALBUMIN 5.1*   Recent Labs  Lab 10/02/19 1738  LIPASE 71*   No results for  input(s): AMMONIA in the last 168 hours. Coagulation Profile: No results for input(s): INR, PROTIME in the last 168 hours. Cardiac Enzymes: Recent Labs  Lab 10/02/19 1738  CKTOTAL 46   BNP (last 3 results) No results for input(s): PROBNP in the last 8760 hours. HbA1C: No results for input(s): HGBA1C in the last 72 hours. CBG: Recent Labs  Lab 10/02/19 1306 10/02/19 1645  GLUCAP >600* >600*   Lipid Profile: No results for input(s): CHOL, HDL, LDLCALC, TRIG, CHOLHDL, LDLDIRECT in the last 72 hours. Thyroid Function Tests: Recent Labs    10/02/19  1738  TSH 0.014*   Anemia Panel: No results for input(s): VITAMINB12, FOLATE, FERRITIN, TIBC, IRON, RETICCTPCT in the last 72 hours. Urine analysis:    Component Value Date/Time   COLORURINE YELLOW 10/02/2019 1738   APPEARANCEUR CLEAR 10/02/2019 1738   LABSPEC 1.022 10/02/2019 1738   PHURINE 5.0 10/02/2019 1738   GLUCOSEU >=500 (A) 10/02/2019 1738   HGBUR MODERATE (A) 10/02/2019 1738   BILIRUBINUR NEGATIVE 10/02/2019 1738   KETONESUR 80 (A) 10/02/2019 1738   PROTEINUR 100 (A) 10/02/2019 1738   UROBILINOGEN 0.2 09/26/2019 1547   NITRITE NEGATIVE 10/02/2019 1738   LEUKOCYTESUR NEGATIVE 10/02/2019 1738    Radiological Exams on Admission: Dg Chest 2 View  Result Date: 10/02/2019 CLINICAL DATA:  Pt reports generalized weakness and some nausea today. Smoker. EXAM: CHEST - 2 VIEW COMPARISON:  Chest radiograph 12/29/2016 FINDINGS: The heart size and mediastinal contours are within normal limits. The lungs are clear. No pneumothorax or pleural effusion. No acute finding in the visualized skeleton. Multilevel degenerative changes in the thoracic spine. IMPRESSION: No evidence of active disease in the chest. Electronically Signed   By: Audie Pinto M.D.   On: 10/02/2019 17:36    EKG: Independently reviewed.  Sinus tachycardia, heart rate 141.  Peaked T waves.  ST changes likely rate related.  No STEMI.  Assessment/Plan  Principal Problem:   DKA (diabetic ketoacidoses) (HCC) Active Problems:   AKI (acute kidney injury) ([place])   Hyperkalemia   Leukocytosis   Abnormal LFTs   Severe DKA Likely secondary to insulin nonadherence. Tachycardic and tachypneic.   AAO x4.  Blood glucose 900.  Bicarb 9, anion gap 31.   UA with evidence of glucosuria and ketones. VBG with pH 7.0. -Admit to stepdown unit -Keep n.p.o. -Insulin infusion per DKA protocol -Received 2 L normal saline boluses in the ED.  Continue IV fluid hydration with normal saline at 200 cc/h.  Switch to D5-1 half normal saline at 125 cc/h when CBG less than 250. -Bicarb infusion at 100 cc/h -CBG checks every 1 hour -BMP every 4 hours -When gap closes and bicarb >18, initiate diet and subcutaneous insulin.  Insulin infusion can be discontinued after 1 to 2 hours. -Check A1c  AKI BUN 34, creatinine 2.1.  Baseline creatinine 0.8.  Likely prerenal due to dehydration from vomiting. -IV fluid hydration -Continue to monitor renal function -Avoid nephrotoxic agents -Monitor urine output  Hyperkalemia Likely secondary to AKI.  Potassium 6.3.  EKG with peaked T waves. -Cardiac monitoring -IV calcium gluconate -Insulin and bicarb infusion as above -BMP every 4 hours  Leukocytosis Likely reactive. Afebrile.  White blood cell count 17.3.  Chest x-ray not suggestive of pneumonia.  UA not suggestive of infection.        -Continue to monitor white blood cell count  Abnormal LFTs Alk phos 132, previously mildly elevated as well.  AST and ALT grossly normal.  T bili 1.8, was previously normal.  Elevated LFTs likely secondary to DKA. -Right upper quadrant ultrasound -Continue to monitor LFTs  Abnormal thyroid function study TSH low at 0.014. -Free T3 and T4  Positive urine pregnancy test Urine pregnancy test positive.  Patient has a history of prior hysterectomy in 2006. -Quantitative beta-hCG  Tobacco use -NicoDerm patch -Counseling  HIV  screening The patient falls between the ages of 13-64 and should be screened for HIV, therefore HIV testing ordered.  DVT prophylaxis: Subcutaneous heparin Code Status: Full code Family Communication: No family available. Disposition Plan: Anticipate discharge after clinical  improvement. Consults called: None Admission status: It is my clinical opinion that referral for OBSERVATION is reasonable and necessary in this patient based on the above information provided. The aforementioned taken together are felt to place the patient at high risk for further clinical deterioration. However it is anticipated that the patient may be medically stable for discharge from the hospital within 24 to 48 hours.  The medical decision making on this patient was of high complexity and the patient is at high risk for clinical deterioration, therefore this is a level 3 visit.  Shela Leff MD [facility] Pager [phone]  If 7PM-7AM, please contact night-coverage [email or web address] Password [facility]  10/02/2019, 9:39 PM

## 2019-10-02 NOTE — Telephone Encounter (Signed)
Spoke with patient on the phone regarding importance of going to the Emergency Department for evaluation of treatment for her critical hyperglycemia. Dicussed with patient complications up to and including diabetic coma and/or death. Patient verbalized understanding and stated she would call [phone] if she did not have a ride to the nearest emergency department.

## 2019-10-02 NOTE — Discharge Instructions (Addendum)
Go to the emergency department for evaluation of your high blood sugar, vomiting, and other symptoms.

## 2019-10-02 NOTE — ED Triage Notes (Signed)
He reports not feeling well with much nausea x 1-2 days. She is in no distress.

## 2019-10-02 NOTE — ED Provider Notes (Signed)
[facility]    CSN: [id number] Arrival date & time: 10/02/19  1211      History   Chief Complaint Chief Complaint  Patient presents with  . Headache  . Fatigue  . Emesis    HPI Whitney Benton is a 48 y.o. female.   Patient presents with vomiting, headache, fatigue x3 days.  She states she is unable to keep any fluids down.  She has attempted treatment at home with Pepto-Bismol and ginger ale but vomits immediately after taking.  She denies fever, diarrhea, cough, shortness of breath, abdominal pain.    The history is provided by the patient.    Past Medical History:  Diagnosis Date  . Anxiety   . Depression   . Trichomonas infection     Patient Active Problem List   Diagnosis Date Noted  . Stressful life events affecting family and household 08/06/2019  . New onset type 2 diabetes mellitus ([place]) 02/04/2019  . Obesity (BMI 30-39.9) 02/04/2019  . Tobacco dependence 02/04/2019  . Left arm weakness 12/06/2014  . Paresthesias/numbness 12/06/2014  . Tobacco abuse 11/14/2014  . Depression   . Mixed incontinence 06/27/2014  . History of TVH in 2006 for fibroids and menorrhagia; benign pathology 09/08/2011    Past Surgical History:  Procedure Laterality Date  . CESAREAN SECTION    . VAGINAL HYSTERECTOMY  2006   Fibroids, menorrhagia, benign pathology    OB History    Gravida  5   Para  3   Term  3   Preterm      AB  2   Living  3     SAB  2   TAB  0   Ectopic  0   Multiple  0   Live Births               Home Medications    Prior to Admission medications   Medication Sig Start Date End Date Taking? Authorizing Provider  atorvastatin (LIPITOR) 20 MG tablet Take 1 tablet (20 mg total) by mouth daily. 02/05/19   Ladell Pier, MD  Blood Glucose Monitoring Suppl (TRUE METRIX METER) w/Device KIT Check blood sugars three times a day 02/04/19   Ladell Pier, MD  cetirizine (ZYRTEC) 10 MG tablet Take 1 tablet (10 mg total) by  mouth daily. 03/31/19   Charlott Rakes, MD  DULoxetine (CYMBALTA) 20 MG capsule Take 1 capsule (20 mg total) by mouth daily. 08/06/19   Ladell Pier, MD  glucose blood test strip Check blood sugars three times a day 02/04/19   Ladell Pier, MD  Insulin Glargine (LANTUS SOLOSTAR) 100 UNIT/ML Solostar Pen Inject 20 Units into the skin daily. 08/06/19   Ladell Pier, MD  Insulin Pen Needle (PEN NEEDLES) 31G X 6 MM MISC Use as directed 02/26/19   Ladell Pier, MD  metFORMIN (GLUCOPHAGE-XR) 500 MG 24 hr tablet TAKE 1 TABLET (500 MG TOTAL) BY MOUTH 2 (TWO) TIMES DAILY. 08/18/19   Ladell Pier, MD  metroNIDAZOLE (FLAGYL) 500 MG tablet Take 1 tablet (500 mg total) by mouth 2 (two) times daily for 7 days. 09/29/19 10/06/19  Raylene Everts, MD  omeprazole (PRILOSEC) 20 MG capsule Take 1 capsule (20 mg total) by mouth daily. 03/01/19   Ladell Pier, MD  SUMAtriptan (IMITREX) 50 MG tablet Take 1 tab at the start of the headache.  If no improvement, may repeat dose in 2 hours.  Max 2 tabs/24-hour.. 05/25/19  Ladell Pier, MD  topiramate (TOPAMAX) 50 MG tablet Take 1 tablet (50 mg total) by mouth 2 (two) times daily. 07/14/19   Argentina Donovan, PA-C  TRUEPLUS LANCETS 28G MISC Check blood sugars three time a day 02/04/19   Ladell Pier, MD  Vitamin D, Ergocalciferol, (DRISDOL) 1.25 MG (50000 UT) CAPS capsule Take 1 capsule (50,000 Units total) by mouth every 7 (seven) days. 07/15/19   Argentina Donovan, PA-C    Family History Family History  Problem Relation Age of Onset  . Diabetes Mother   . Mental illness Mother   . Depression Mother   . Hypertension Mother   . Hypertension Father   . Diabetes Father     Social History Social History   Tobacco Use  . Smoking status: Current Every Day Smoker    Packs/day: 0.25    Years: 8.00    Pack years: 2.00    Types: Cigarettes  . Smokeless tobacco: Never Used  Substance Use Topics  . Alcohol use: Yes    Comment:  occasional  . Drug use: No     Allergies   Aspirin and Oxycodone   Review of Systems Review of Systems  Constitutional: Positive for fatigue. Negative for chills and fever.  HENT: Negative for ear pain and sore throat.   Eyes: Negative for pain and visual disturbance.  Respiratory: Negative for cough and shortness of breath.   Cardiovascular: Negative for chest pain and palpitations.  Gastrointestinal: Positive for vomiting. Negative for abdominal pain.  Genitourinary: Negative for dysuria and hematuria.  Musculoskeletal: Negative for arthralgias and back pain.  Skin: Negative for color change and rash.  Neurological: Positive for headaches. Negative for seizures and syncope.  All other systems reviewed and are negative.    Physical Exam Triage Vital Signs ED Triage Vitals [10/02/19 1226]  Enc Vitals Group     BP      Pulse      Resp      Temp      Temp src      SpO2      Weight      Height      Head Circumference      Peak Flow      Pain Score 9     Pain Loc      Pain Edu?      Excl. in [place]?    No data found.  Updated Vital Signs BP (!) 131/92 (BP Location: Right Arm)   Pulse (!) 142   Temp 97.6 F (36.4 C) (Temporal)   Resp 18   SpO2 97%   Visual Acuity Right Eye Distance:   Left Eye Distance:   Bilateral Distance:    Right Eye Near:   Left Eye Near:    Bilateral Near:     Physical Exam Vitals signs and nursing note reviewed.  Constitutional:      Appearance: She is well-developed. She is ill-appearing.  HENT:     Head: Normocephalic and atraumatic.  Eyes:     Conjunctiva/sclera: Conjunctivae normal.  Neck:     Musculoskeletal: Neck supple.  Cardiovascular:     Rate and Rhythm: Regular rhythm. Tachycardia present.     Heart sounds: No murmur.  Pulmonary:     Effort: Pulmonary effort is normal. No respiratory distress.     Breath sounds: Normal breath sounds.  Abdominal:     General: Bowel sounds are normal.     Palpations: Abdomen is  soft.  Tenderness: There is no abdominal tenderness. There is no guarding or rebound.  Skin:    General: Skin is warm and dry.  Neurological:     General: No focal deficit present.     Mental Status: She is alert and oriented to person, place, and time.      UC Treatments / Results  Labs (all labs ordered are listed, but only abnormal results are displayed) Labs Reviewed  GLUCOSE, CAPILLARY - Abnormal; Notable for the following components:      Result Value   Glucose-Capillary >600 (*)    All other components within normal limits  NOVEL CORONAVIRUS, NAA (HOSP ORDER, SEND-OUT TO REF LAB; TAT 18-24 HRS)  CBG MONITORING, ED    EKG   Radiology No results found.  Procedures Procedures (including critical care time)  Medications Ordered in UC Medications  diphenhydrAMINE (BENADRYL) 50 MG/ML injection (has no administration in time range)    Initial Impression / Assessment and Plan / UC Course  I have reviewed the triage vital signs and the nursing notes.  Pertinent labs & imaging results that were available during my care of the patient were reviewed by me and considered in my medical decision making (see chart for details).    Hyperglycemia, tachycardia, emesis.  Patient is ill-appearing; heart rate 140s.  Sending her to the emergency department for evaluation.     Final Clinical Impressions(s) / UC Diagnoses   Final diagnoses:  Hyperglycemia  Nausea and vomiting, intractability of vomiting not specified, unspecified vomiting type  Tachycardia     Discharge Instructions     Go to the emergency department for evaluation of your high blood sugar, vomiting, and other symptoms.        ED Prescriptions    None     PDMP not reviewed this encounter.   Sharion Balloon, NP 10/02/19 1310

## 2019-10-02 NOTE — ED Triage Notes (Signed)
Pt reports headache, vomiting episodes and fatigue x 2 days.

## 2019-10-02 NOTE — ED Notes (Signed)
Date and time results received: 10/02/19 2014 (use smartphrase ".now" to insert current time)  Test: K+ 6.3, Glucose 900 Critical Value: glucose and K+  Name of Provider Notified: Nanda Quinton MD  Orders Received? Or Actions Taken?: new order in chart

## 2019-10-02 NOTE — ED Provider Notes (Signed)
Emergency Department Provider Note   I have reviewed the triage vital signs and the nursing notes.   HISTORY  Chief Complaint Hyperglycemia and Nausea   HPI Whitney Benton is a 48 y.o. female with past medical history of insulin-dependent diabetes presents to the emergency department with generalized weakness, nausea, and hyperglycemia.  Patient initially presented to the [facility] urgent care and was found to be very tachycardic and hyperglycemic.  She was transferred to the emergency department for further evaluation.  Patient reports nausea with some vomiting.  She denies abdominal pain.  She reports some mild shortness of breath but nothing severe.  No heart palpitations.  She denies chest pain.  She has been compliant with her insulin but states her oral medications have difficulty take due to nausea and vomiting.  No COVID-19 contacts.  No radiation of symptoms or other modifying factors.  Patient denies prior admissions for DKA.    Past Medical History:  Diagnosis Date   Anxiety    Depression    Trichomonas infection     Patient Active Problem List   Diagnosis Date Noted   DKA (diabetic ketoacidoses) ([place]) 10/02/2019   AKI (acute kidney injury) ([place]) 10/02/2019   Hyperkalemia 10/02/2019   Leukocytosis 10/02/2019   Abnormal LFTs 10/02/2019   Stressful life events affecting family and household 08/06/2019   New onset type 2 diabetes mellitus ([place]) 02/04/2019   Obesity (BMI 30-39.9) 02/04/2019   Tobacco dependence 02/04/2019   Left arm weakness 12/06/2014   Paresthesias/numbness 12/06/2014   Tobacco abuse 11/14/2014   Depression    Mixed incontinence 06/27/2014   History of TVH in 2006 for fibroids and menorrhagia; benign pathology 09/08/2011    Past Surgical History:  Procedure Laterality Date   CESAREAN SECTION     VAGINAL HYSTERECTOMY  2006   Fibroids, menorrhagia, benign pathology    Allergies Aspirin and Oxycodone  Family History    Problem Relation Age of Onset   Diabetes Mother    Mental illness Mother    Depression Mother    Hypertension Mother    Hypertension Father    Diabetes Father     Social History Social History   Tobacco Use   Smoking status: Current Every Day Smoker    Packs/day: 0.25    Years: 8.00    Pack years: 2.00    Types: Cigarettes   Smokeless tobacco: Never Used  Substance Use Topics   Alcohol use: Yes    Comment: occasional   Drug use: No    Review of Systems  Constitutional: No fever/chills. Positive generalized weakness.  Eyes: No visual changes. ENT: No sore throat. Cardiovascular: Denies chest pain. Respiratory: Positive mild shortness of breath. Gastrointestinal: No abdominal pain. Positive nausea and vomiting.  No diarrhea.  No constipation. Genitourinary: Negative for dysuria. Musculoskeletal: Negative for back pain. Skin: Negative for rash. Neurological: Negative for headaches, focal weakness or numbness.  10-point ROS otherwise negative.  ____________________________________________   PHYSICAL EXAM:  VITAL SIGNS: ED Triage Vitals  Enc Vitals Group     BP 10/02/19 1653 121/80     Pulse Rate 10/02/19 1653 (!) 141     Resp 10/02/19 1653 (!) 25     Temp 10/02/19 1653 98 F (36.7 C)     Temp Source 10/02/19 1653 Oral     SpO2 10/02/19 1653 97 %   Constitutional: Alert and oriented. Well appearing and in no acute distress. Eyes: Conjunctivae are normal. Head: Atraumatic. Nose: No congestion/rhinnorhea. Mouth/Throat: Mucous  membranes are somewhat dry.  Neck: No stridor.   Cardiovascular: Tachycardia. Good peripheral circulation. Grossly normal heart sounds.   Respiratory: Normal respiratory effort.  No retractions. Lungs CTAB. Gastrointestinal: Soft and nontender. No distention.  Musculoskeletal: No lower extremity tenderness nor edema. No gross deformities of extremities. Neurologic:  Normal speech and language.  Skin:  Skin is warm, dry and  intact. No rash noted.  ____________________________________________   LABS (all labs ordered are listed, but only abnormal results are displayed)  Labs Reviewed  COMPREHENSIVE METABOLIC PANEL - Abnormal; Notable for the following components:      Result Value   Sodium 131 (*)    Potassium 6.3 (*)    Chloride 91 (*)    CO2 9 (*)    Glucose, Bld 900 (*)    BUN 34 (*)    Creatinine, Ser 2.16 (*)    Total Protein 9.1 (*)    Albumin 5.1 (*)    AST 13 (*)    Alkaline Phosphatase 132 (*)    Total Bilirubin 1.8 (*)    GFR calc non Af Amer 26 (*)    GFR calc Af Amer 30 (*)    Anion gap 31 (*)    All other components within normal limits  LIPASE, BLOOD - Abnormal; Notable for the following components:   Lipase 71 (*)    All other components within normal limits  CBC WITH DIFFERENTIAL/PLATELET - Abnormal; Notable for the following components:   WBC 17.3 (*)    RBC 6.00 (*)    Hemoglobin 16.7 (*)    HCT 51.7 (*)    Platelets 445 (*)    Neutro Abs 14.8 (*)    Abs Immature Granulocytes 0.32 (*)    All other components within normal limits  BLOOD GAS, VENOUS - Abnormal; Notable for the following components:   pH, Ven 7.047 (*)    pCO2, Ven 32.1 (*)    pO2, Ven 59.2 (*)    Bicarbonate 8.4 (*)    Acid-base deficit 22.6 (*)    All other components within normal limits  URINALYSIS, ROUTINE W REFLEX MICROSCOPIC - Abnormal; Notable for the following components:   Glucose, UA >=500 (*)    Hgb urine dipstick MODERATE (*)    Ketones, ur 80 (*)    Protein, ur 100 (*)    Bacteria, UA RARE (*)    All other components within normal limits  PREGNANCY, URINE - Abnormal; Notable for the following components:   Preg Test, Ur POSITIVE (*)    All other components within normal limits  TSH - Abnormal; Notable for the following components:   TSH 0.014 (*)    All other components within normal limits  BASIC METABOLIC PANEL - Abnormal; Notable for the following components:   Chloride 112 (*)      CO2 10 (*)    Glucose, Bld 317 (*)    BUN 30 (*)    Creatinine, Ser 1.32 (*)    GFR calc non Af Amer 47 (*)    GFR calc Af Amer 55 (*)    Anion gap 16 (*)    All other components within normal limits  BASIC METABOLIC PANEL - Abnormal; Notable for the following components:   CO2 17 (*)    Glucose, Bld 165 (*)    BUN 24 (*)    Calcium 8.7 (*)    All other components within normal limits  HEPATIC FUNCTION PANEL - Abnormal; Notable for the following components:   AST  9 (*)    Total Bilirubin 1.3 (*)    Indirect Bilirubin 1.2 (*)    All other components within normal limits  HEMOGLOBIN A1C - Abnormal; Notable for the following components:   Hgb A1c MFr Bld 14.9 (*)    All other components within normal limits  CBC - Abnormal; Notable for the following components:   WBC 14.1 (*)    All other components within normal limits  GLUCOSE, CAPILLARY - Abnormal; Notable for the following components:   Glucose-Capillary 414 (*)    All other components within normal limits  GLUCOSE, CAPILLARY - Abnormal; Notable for the following components:   Glucose-Capillary 314 (*)    All other components within normal limits  GLUCOSE, CAPILLARY - Abnormal; Notable for the following components:   Glucose-Capillary 250 (*)    All other components within normal limits  GLUCOSE, CAPILLARY - Abnormal; Notable for the following components:   Glucose-Capillary 230 (*)    All other components within normal limits  GLUCOSE, CAPILLARY - Abnormal; Notable for the following components:   Glucose-Capillary 180 (*)    All other components within normal limits  GLUCOSE, CAPILLARY - Abnormal; Notable for the following components:   Glucose-Capillary 155 (*)    All other components within normal limits  GLUCOSE, CAPILLARY - Abnormal; Notable for the following components:   Glucose-Capillary 157 (*)    All other components within normal limits  GLUCOSE, CAPILLARY - Abnormal; Notable for the following components:    Glucose-Capillary 162 (*)    All other components within normal limits  GLUCOSE, CAPILLARY - Abnormal; Notable for the following components:   Glucose-Capillary 145 (*)    All other components within normal limits  CBG MONITORING, ED - Abnormal; Notable for the following components:   Glucose-Capillary >600 (*)    All other components within normal limits  CBG MONITORING, ED - Abnormal; Notable for the following components:   Glucose-Capillary >600 (*)    All other components within normal limits  CBG MONITORING, ED - Abnormal; Notable for the following components:   Glucose-Capillary >600 (*)    All other components within normal limits  CBG MONITORING, ED - Abnormal; Notable for the following components:   Glucose-Capillary 553 (*)    All other components within normal limits  CBG MONITORING, ED - Abnormal; Notable for the following components:   Glucose-Capillary 443 (*)    All other components within normal limits  SARS CORONAVIRUS 2 (HOSPITAL ORDER, [place])  MRSA PCR SCREENING  CK  HCG, QUANTITATIVE, PREGNANCY  T4, FREE  HIV4GL SAVE TUBE  T3, FREE  HIV ANTIBODY (ROUTINE TESTING W REFLEX)  T3  T4, FREE  BASIC METABOLIC PANEL   ____________________________________________  EKG   EKG Interpretation  Date/Time:  Saturday October 02 2019 16:49:02 EDT Ventricular Rate:  141 PR Interval:    QRS Duration: 85 QT Interval:  305 QTC Calculation: 468 R Axis:   -53 Text Interpretation:  Sinus tachycardia Atrial premature complex Nonspecific ST changes likely rate-related.  No STEMI  Confirmed by Nanda Quinton [id number]) on 10/02/2019 4:52:22 PM       ____________________________________________  RADIOLOGY  Dg Chest 2 View  Result Date: 10/02/2019 CLINICAL DATA:  Pt reports generalized weakness and some nausea today. Smoker. EXAM: CHEST - 2 VIEW COMPARISON:  Chest radiograph 12/29/2016 FINDINGS: The heart size and mediastinal contours are  within normal limits. The lungs are clear. No pneumothorax or pleural effusion. No acute finding in the visualized  skeleton. Multilevel degenerative changes in the thoracic spine. IMPRESSION: No evidence of active disease in the chest. Electronically Signed   By: Audie Pinto M.D.   On: 10/02/2019 17:36   US Pelvic Complete With Transvaginal  Result Date: 10/03/2019 CLINICAL DATA:  Positive urine pregnancy test. Hysterectomy. Normal b HCG EXAM: TRANSABDOMINAL AND TRANSVAGINAL ULTRASOUND OF PELVIS DOPPLER ULTRASOUND OF OVARIES TECHNIQUE: Both transabdominal and transvaginal ultrasound examinations of the pelvis were performed. Transabdominal technique was performed for global imaging of the pelvis including uterus, ovaries, adnexal regions, and pelvic cul-de-sac. It was necessary to proceed with endovaginal exam following the transabdominal exam to visualize the ovaries. Color and duplex Doppler ultrasound was utilized to evaluate blood flow to the ovaries. COMPARISON:  None. FINDINGS: Uterus Measurements: Post hysterectomy = volume: mL. No fibroids or other mass visualized. Endometrium Thickness: Post hysterectomy.  No focal abnormality visualized. Right ovary Measurements: 2.5 x 1.4 x 1.2 cm = normal size.  Normal appear Left ovary Measurements: Overlying bowel gas.  Not identified. Pulsed Doppler evaluation of RIGHT ovary demonstrates normal low-resistance arterial and venous waveforms. Other findings No abnormal free fluid. IMPRESSION: 1. No ectopic pregnancy identified. 2. Post hysterectomy. 3. Normal RIGHT ovary. 4. LEFT ovary not identified.  Overlying bowel gas. 5. Normal beta HCG Electronically Signed   By: Suzy Bouchard M.D.   On: 10/03/2019 09:54   US Abdomen Limited Ruq  Result Date: 10/02/2019 CLINICAL DATA:  Elevated LFTs EXAM: ULTRASOUND ABDOMEN LIMITED RIGHT UPPER QUADRANT COMPARISON:  None. FINDINGS: Gallbladder: No gallstones or wall thickening visualized. No sonographic Murphy  sign noted by sonographer. Common bile duct: Diameter: 3 mm Liver: No focal lesion identified. Within normal limits in parenchymal echogenicity. Portal vein is patent on color Doppler imaging with normal direction of blood flow towards the liver. Other: None. IMPRESSION: Normal study. Electronically Signed   By: Constance Holster M.D.   On: 10/02/2019 22:36    ____________________________________________   PROCEDURES  Procedure(s) performed:   Procedures  CRITICAL CARE Performed by: Margette Fast Total critical care time: 35 minutes Critical care time was exclusive of separately billable procedures and treating other patients. Critical care was necessary to treat or prevent imminent or life-threatening deterioration. Critical care was time spent personally by me on the following activities: development of treatment plan with patient and/or surrogate as well as nursing, discussions with consultants, evaluation of patient's response to treatment, examination of patient, obtaining history from patient or surrogate, ordering and performing treatments and interventions, ordering and review of laboratory studies, ordering and review of radiographic studies, pulse oximetry and re-evaluation of patient's condition.  Nanda Quinton, MD Emergency Medicine  ____________________________________________   INITIAL IMPRESSION / ASSESSMENT AND PLAN / ED COURSE  Pertinent labs & imaging results that were available during my care of the patient were reviewed by me and considered in my medical decision making (see chart for details).   Patient presents to the emergency department with weakness, nausea/vomiting, and mild shortness of breath.  Blood sugar in triage is greater than 600.  Sinus tachycardia with nonspecific ST changes on EKG.  Lower suspicion for cardiopulmonary cause of her symptoms such as PE given the associated hyperglycemia.  Suspect DKA clinically as this would also account for her mild  shortness of breath.  Plan for screening lab work along with chest x-ray with IV fluids upfront and reassess.  DKA confirmed on labs. Mental status remains normal. IV insulin started.   Discussed patient's case with [facility] to request admission. Patient  and family (if present) updated with plan. Care transferred to [facility] service.  I reviewed all nursing notes, vitals, pertinent old records, EKGs, labs, imaging (as available).  ____________________________________________  FINAL CLINICAL IMPRESSION(S) / ED DIAGNOSES  Final diagnoses:  Diabetic ketoacidosis without coma associated with type 1 diabetes mellitus (HCC)  Elevated LFTs     MEDICATIONS GIVEN DURING THIS VISIT:  Medications  0.9 %  sodium chloride infusion ( Intravenous Stopped 10/03/19 0419)  dextrose 5 %-0.45 % sodium chloride infusion ( Intravenous Rate/Dose Verify 10/03/19 0614)  insulin regular, human (MYXREDLIN) 100 units/ 100 mL infusion ( Intravenous Rate/Dose Verify 10/03/19 0614)  heparin injection 5,000 Units (5,000 Units Subcutaneous Given 10/03/19 0517)  nicotine (NICODERM CQ - dosed in mg/24 hr) patch 7 mg (7 mg Transdermal Patch Applied 10/03/19 0146)  sodium bicarbonate 150 mEq in sterile water 1,000 mL infusion ( Intravenous Rate/Dose Verify 10/03/19 [id number])  Chlorhexidine Gluconate Cloth 2 % PADS 6 each (has no administration in time range)  sodium chloride 0.9 % bolus 1,000 mL (0 mLs Intravenous Stopped 10/02/19 2014)  sodium chloride 0.9 % bolus 1,000 mL (0 mLs Intravenous Stopping Infusion hung by another clincian 10/02/19 2015)  calcium gluconate 1 g/ 50 mL sodium chloride IVPB (0 mg Intravenous Stopped 10/03/19 0120)    Note:  This document was prepared using Dragon voice recognition software and may include unintentional dictation errors.  Nanda Quinton, MD, [facility] Emergency Medicine    Mahalia Dykes, [facility], MD 10/03/19 1055

## 2019-10-02 NOTE — ED Notes (Signed)
Roselyn Reef Agricultural consultant at [employer] ED aware of patient's impending arrival and condition.

## 2019-10-03 ENCOUNTER — Encounter (HOSPITAL_COMMUNITY): Payer: Self-pay | Admitting: *Deleted

## 2019-10-03 ENCOUNTER — Observation Stay (HOSPITAL_COMMUNITY): Payer: Self-pay

## 2019-10-03 DIAGNOSIS — R7989 Other specified abnormal findings of blood chemistry: Secondary | ICD-10-CM

## 2019-10-03 DIAGNOSIS — R945 Abnormal results of liver function studies: Secondary | ICD-10-CM

## 2019-10-03 DIAGNOSIS — N179 Acute kidney failure, unspecified: Secondary | ICD-10-CM

## 2019-10-03 DIAGNOSIS — E1010 Type 1 diabetes mellitus with ketoacidosis without coma: Principal | ICD-10-CM

## 2019-10-03 LAB — T4, FREE
Free T4: 1.1 ng/dL (ref 0.61–1.12)
Free T4: 1.56 ng/dL — ABNORMAL HIGH (ref 0.61–1.12)

## 2019-10-03 LAB — BASIC METABOLIC PANEL
Anion gap: 16 — ABNORMAL HIGH (ref 5–15)
Anion gap: 11 (ref 5–15)
Anion gap: 11 (ref 5–15)
BUN: 30 mg/dL — ABNORMAL HIGH (ref 6–20)
BUN: 24 mg/dL — ABNORMAL HIGH (ref 6–20)
BUN: 20 mg/dL (ref 6–20)
CO2: 10 mmol/L — ABNORMAL LOW (ref 22–32)
CO2: 17 mmol/L — ABNORMAL LOW (ref 22–32)
CO2: 20 mmol/L — ABNORMAL LOW (ref 22–32)
Calcium: 9.2 mg/dL (ref 8.9–10.3)
Calcium: 8.7 mg/dL — ABNORMAL LOW (ref 8.9–10.3)
Calcium: 8.5 mg/dL — ABNORMAL LOW (ref 8.9–10.3)
Chloride: 112 mmol/L — ABNORMAL HIGH (ref 98–111)
Chloride: 111 mmol/L (ref 98–111)
Chloride: 108 mmol/L (ref 98–111)
Creatinine, Ser: 1.32 mg/dL — ABNORMAL HIGH (ref 0.44–1.00)
Creatinine, Ser: 0.89 mg/dL (ref 0.44–1.00)
Creatinine, Ser: 0.81 mg/dL (ref 0.44–1.00)
GFR calc Af Amer: 55 mL/min — ABNORMAL LOW (ref >60)
GFR calc Af Amer: >60 mL/min (ref >60)
GFR calc Af Amer: >60 mL/min (ref >60)
GFR calc non Af Amer: 47 mL/min — ABNORMAL LOW (ref >60)
GFR calc non Af Amer: >60 mL/min (ref >60)
GFR calc non Af Amer: >60 mL/min (ref >60)
Glucose, Bld: 317 mg/dL — ABNORMAL HIGH (ref 70–99)
Glucose, Bld: 165 mg/dL — ABNORMAL HIGH (ref 70–99)
Glucose, Bld: 171 mg/dL — ABNORMAL HIGH (ref 70–99)
Potassium: 4.3 mmol/L (ref 3.5–5.1)
Potassium: 3.7 mmol/L (ref 3.5–5.1)
Potassium: 3.3 mmol/L — ABNORMAL LOW (ref 3.5–5.1)
Sodium: 138 mmol/L (ref 135–145)
Sodium: 139 mmol/L (ref 135–145)
Sodium: 139 mmol/L (ref 135–145)

## 2019-10-03 LAB — GLUCOSE, CAPILLARY
Glucose-Capillary: 414 mg/dL — ABNORMAL HIGH (ref 70–99)
Glucose-Capillary: 314 mg/dL — ABNORMAL HIGH (ref 70–99)
Glucose-Capillary: 250 mg/dL — ABNORMAL HIGH (ref 70–99)
Glucose-Capillary: 230 mg/dL — ABNORMAL HIGH (ref 70–99)
Glucose-Capillary: 180 mg/dL — ABNORMAL HIGH (ref 70–99)
Glucose-Capillary: 155 mg/dL — ABNORMAL HIGH (ref 70–99)
Glucose-Capillary: 157 mg/dL — ABNORMAL HIGH (ref 70–99)
Glucose-Capillary: 162 mg/dL — ABNORMAL HIGH (ref 70–99)
Glucose-Capillary: 145 mg/dL — ABNORMAL HIGH (ref 70–99)
Glucose-Capillary: 154 mg/dL — ABNORMAL HIGH (ref 70–99)
Glucose-Capillary: 149 mg/dL — ABNORMAL HIGH (ref 70–99)
Glucose-Capillary: 250 mg/dL — ABNORMAL HIGH (ref 70–99)
Glucose-Capillary: 99 mg/dL (ref 70–99)
Glucose-Capillary: 109 mg/dL — ABNORMAL HIGH (ref 70–99)
Glucose-Capillary: 314 mg/dL — ABNORMAL HIGH (ref 70–99)
Glucose-Capillary: 400 mg/dL — ABNORMAL HIGH (ref 70–99)

## 2019-10-03 LAB — HEMOGLOBIN A1C
Hgb A1c MFr Bld: 14.9 % — ABNORMAL HIGH (ref 4.8–5.6)
Mean Plasma Glucose: 380.93 mg/dL

## 2019-10-03 LAB — HEPATIC FUNCTION PANEL
ALT: 11 U/L (ref 0–44)
AST: 9 U/L — ABNORMAL LOW (ref 15–41)
Albumin: 3.7 g/dL (ref 3.5–5.0)
Alkaline Phosphatase: 84 U/L (ref 38–126)
Bilirubin, Direct: 0.1 mg/dL (ref 0.0–0.2)
Indirect Bilirubin: 1.2 mg/dL — ABNORMAL HIGH (ref 0.3–0.9)
Total Bilirubin: 1.3 mg/dL — ABNORMAL HIGH (ref 0.3–1.2)
Total Protein: 6.7 g/dL (ref 6.5–8.1)

## 2019-10-03 LAB — CBC
HCT: 40.5 % (ref 36.0–46.0)
Hemoglobin: 13.8 g/dL (ref 12.0–15.0)
MCH: 28.2 pg (ref 26.0–34.0)
MCHC: 34.1 g/dL (ref 30.0–36.0)
MCV: 82.8 fL (ref 80.0–100.0)
Platelets: 311 10*3/uL (ref 150–400)
RBC: 4.89 MIL/uL (ref 3.87–5.11)
RDW: 13.3 % (ref 11.5–15.5)
WBC: 14.1 10*3/uL — ABNORMAL HIGH (ref 4.0–10.5)
nRBC: 0 % (ref 0.0–0.2)

## 2019-10-03 LAB — NOVEL CORONAVIRUS, NAA (HOSP ORDER, SEND-OUT TO REF LAB; TAT 18-24 HRS): SARS-CoV-2, NAA: NOT DETECTED

## 2019-10-03 LAB — HCG, QUANTITATIVE, PREGNANCY: hCG, Beta Chain, Quant, S: 1 m[IU]/mL (ref <5)

## 2019-10-03 LAB — CBG MONITORING, ED
Glucose-Capillary: 443 mg/dL — ABNORMAL HIGH (ref 70–99)
Glucose-Capillary: 553 mg/dL (ref 70–99)

## 2019-10-03 LAB — HIV ANTIBODY (ROUTINE TESTING W REFLEX): HIV Screen 4th Generation wRfx: NONREACTIVE

## 2019-10-03 LAB — MRSA PCR SCREENING: MRSA by PCR: NEGATIVE

## 2019-10-03 MED ORDER — METRONIDAZOLE 500 MG PO TABS
500.0000 mg | ORAL_TABLET | Freq: Two times a day (BID) | ORAL | Status: DC
  Administered 2019-10-03 – 2019-10-04 (×2): 500 mg via ORAL
  Filled 2019-10-03 (×2): qty 1

## 2019-10-03 MED ORDER — DULOXETINE HCL 20 MG PO CPEP
20.0000 mg | ORAL_CAPSULE | Freq: Every day | ORAL | Status: DC
  Administered 2019-10-03 – 2019-10-04 (×2): 20 mg via ORAL
  Filled 2019-10-03 (×2): qty 1

## 2019-10-03 MED ORDER — INSULIN GLARGINE 100 UNIT/ML ~~LOC~~ SOLN
20.0000 [IU] | Freq: Every day | SUBCUTANEOUS | Status: DC
  Administered 2019-10-03: 20 [IU] via SUBCUTANEOUS
  Filled 2019-10-03 (×2): qty 0.2

## 2019-10-03 MED ORDER — TOPIRAMATE 25 MG PO TABS
50.0000 mg | ORAL_TABLET | Freq: Two times a day (BID) | ORAL | Status: DC
  Administered 2019-10-03 – 2019-10-04 (×2): 50 mg via ORAL
  Filled 2019-10-03 (×2): qty 2

## 2019-10-03 MED ORDER — INSULIN ASPART 100 UNIT/ML ~~LOC~~ SOLN: 0.0000 [IU] | Freq: Three times a day (TID) | SUBCUTANEOUS | Status: DC

## 2019-10-03 MED ORDER — INSULIN ASPART 100 UNIT/ML ~~LOC~~ SOLN
0.0000 [IU] | Freq: Every day | SUBCUTANEOUS | Status: DC
  Administered 2019-10-03: 5 [IU] via SUBCUTANEOUS

## 2019-10-03 MED ORDER — ATORVASTATIN CALCIUM 20 MG PO TABS
20.0000 mg | ORAL_TABLET | Freq: Every day | ORAL | Status: DC
  Administered 2019-10-03: 20 mg via ORAL
  Filled 2019-10-03: qty 2

## 2019-10-03 MED ORDER — METRONIDAZOLE 500 MG PO TABS: 500.0000 mg | ORAL_TABLET | Freq: Two times a day (BID) | ORAL | Status: DC

## 2019-10-03 MED ORDER — ONDANSETRON HCL 4 MG/2ML IJ SOLN: 4.0000 mg | Freq: Four times a day (QID) | INTRAMUSCULAR | Status: DC | PRN

## 2019-10-03 MED ORDER — CHLORHEXIDINE GLUCONATE CLOTH 2 % EX PADS: 6.0000 | MEDICATED_PAD | Freq: Every day | CUTANEOUS | Status: DC

## 2019-10-03 MED ORDER — PANTOPRAZOLE SODIUM 40 MG PO TBEC
40.0000 mg | DELAYED_RELEASE_TABLET | Freq: Every day | ORAL | Status: DC
  Administered 2019-10-03 – 2019-10-04 (×2): 40 mg via ORAL
  Filled 2019-10-03 (×2): qty 1

## 2019-10-03 MED ORDER — INSULIN ASPART 100 UNIT/ML ~~LOC~~ SOLN
0.0000 [IU] | Freq: Three times a day (TID) | SUBCUTANEOUS | Status: DC
  Administered 2019-10-03: 7 [IU] via SUBCUTANEOUS

## 2019-10-03 NOTE — Plan of Care (Signed)
Patient arrived to floor via wheelchair from ICU; ambulated from chair to bed with 1 assist. Pain controlled at this time. No needs expressed. Will continue to monitor.

## 2019-10-03 NOTE — Progress Notes (Signed)
[facility]                                                                              Patient Demographics  Whitney Benton, is a 48 y.o. female, DOB - 01-18-1971, [id number]  Admit date - 10/02/2019   Admitting Physician Shela Leff, MD  Outpatient Primary MD for the patient is Ladell Pier, MD  Outpatient specialists:   LOS - 0  days   Medical records reviewed and are as summarized below:    Chief Complaint  Patient presents with   Hyperglycemia   Nausea       Brief summary   Patient is a 48 year old female with history of IDDM, anxiety, depression presented with nausea and vomiting.  Patient initially was evaluated at urgent care and was found to be tachycardic and hyperglycemic.  Patient reported that she has been vomiting for the past 4 days and unable to keep anything down.  No abdominal pain.  Patient reports that she normally takes Lantus 20 units daily however has not been using it recently as she has been busy taking care of family member who was admitted to the ICU. In ED, EKG with peaked T waves, blood glucose 900, potassium 6.3 Urine pregnancy test positive.  TSH low 0.014.  Patient was started on insulin infusion and received IV fluids COVID-19 test negative  Assessment & Plan    Principal Problem: Severe DKA (diabetic ketoacidoses) ([place]) -Patient presented with severe DKA, anion gap 31, glucose 900, bicarb 9, chloride 91, potassium 6.3, sodium 131.  pH 7.0.  UA positive for ketones - continue IV insulin drip, IV fluid hydration, until bicarb >18, gap closed, nausea vomiting improved, CBGs less than 1804    Active Problems: Diabetes mellitus type 2, uncontrolled, IDDM with hyperglycemia -Hemoglobin A1c 14.9 - will place diabetic alternator consult, nutrition consult. -Counseled to be compliant with her medications especially insulin    AKI (acute kidney injury) ([place]) -Likely due to profound dehydration, DKA, creatinine  2.1 at the time of admission -Continue IV fluids, creatinine improving, 0.8     Hyperkalemia -Secondary to severe DKA, potassium 6.3 at the time of admission with peak T waves on EKG -Currently improving, 3.7, on IV insulin drip     Leukocytosis -Likely stress demargination, no obvious source of infection, COVID-19 test negative -UA negative for UTI    Abnormal LFTs, mild elevated lipase Likely due to acute DKA, improving   Urine pregnancy test positive Patient reports history of hysterectomy.  Will obtain OB pelvic ultrasound to rule out any ectopic pregnancy.  Discussed with the patient, she agrees with the plan    Code Status: Full CODE STATUS DVT Prophylaxis: Heparin subcu Family Communication: Discussed all imaging results, lab results, explained to the patient    Disposition Plan: Still on IV fluids and insulin drip, likely will not discharge today.  Will change to inpatient status  Time Spent in minutes 40 minutes  Procedures:  None  Consultants:   None  Antimicrobials:   Anti-infectives (From admission, onward)   None          Medications  Scheduled Meds:  Chlorhexidine  Gluconate Cloth  6 each Topical Daily   heparin  5,000 Units Subcutaneous Q8H   nicotine  7 mg Transdermal Daily   Continuous Infusions:  sodium chloride Stopped (10/03/19 0419)   dextrose 5 % and 0.45% NaCl 125 mL/hr at 10/03/19 0614   insulin 5.7 mL/hr at 10/03/19 0614    sodium bicarbonate (isotonic) infusion in sterile water 125 mL/hr at 10/03/19 0614   PRN Meds:.      Subjective:   Nevaiah Mizera was seen and examined today.  Somewhat somnolent but easily arousable.  Denies any pain.  No ongoing nausea or vomiting.  No fevers or chills.  Patient denies dizziness, chest pain, shortness of breath, abdominal pain, new weakness, numbess, tingling. No acute events overnight.    Objective:   Vitals:   10/03/19 0700 10/03/19 0800 10/03/19 0834 10/03/19 0900  BP:  139/80 120/75  136/75  Pulse: (!) 115 (!) 104  (!) 107  Resp: 14 17  20   Temp:   98.3 F (36.8 C)   TempSrc:   Oral   SpO2: 98% 99%  98%    Intake/Output Summary (Last 24 hours) at 10/03/2019 1007 Last data filed at 10/03/2019 [id number] Gross per 24 hour  Intake 4054.45 ml  Output --  Net 4054.45 ml     Wt Readings from Last 3 Encounters:  07/14/19 83.6 kg  02/26/19 90 kg  02/04/19 90.5 kg     Exam  General: Somnolent but easily arousable and oriented x3, NAD  Eyes:   HEENT:  Atraumatic, normocephalic, dry mucosal membrane  Cardiovascular: S1 S2 auscultated, no murmurs, RRR  Respiratory: Clear to auscultation bilaterally, no wheezing, rales or rhonchi  Gastrointestinal: Soft, nontender, nondistended, + bowel sounds  Ext: no pedal edema bilaterally  Neuro: No new FND's  Musculoskeletal: No digital cyanosis, clubbing  Skin: No rashes  Psych: Somnolent but easily arousable and oriented   Data Reviewed:  I have personally reviewed following labs and imaging studies  Micro Results Recent Results (from the past 240 hour(s))  SARS Coronavirus 2 by RT PCR (hospital order, performed in [place] hospital lab)     Status: None   Collection Time: 10/02/19  6:58 PM  Result Value Ref Range Status   SARS Coronavirus 2 NEGATIVE NEGATIVE Final    Comment: (NOTE) If result is NEGATIVE SARS-CoV-2 target nucleic acids are NOT DETECTED. The SARS-CoV-2 RNA is generally detectable in upper and lower  respiratory specimens during the acute phase of infection. The lowest  concentration of SARS-CoV-2 viral copies this assay can detect is 250  copies / mL. A negative result does not preclude SARS-CoV-2 infection  and should not be used as the sole basis for treatment or other  patient management decisions.  A negative result may occur with  improper specimen collection / handling, submission of specimen other  than nasopharyngeal swab, presence of viral mutation(s) within the   areas targeted by this assay, and inadequate number of viral copies  (<250 copies / mL). A negative result must be combined with clinical  observations, patient history, and epidemiological information. If result is POSITIVE SARS-CoV-2 target nucleic acids are DETECTED. The SARS-CoV-2 RNA is generally detectable in upper and lower  respiratory specimens dur ing the acute phase of infection.  Positive  results are indicative of active infection with SARS-CoV-2.  Clinical  correlation with patient history and other diagnostic information is  necessary to determine patient infection status.  Positive results do  not rule out bacterial infection  or co-infection with other viruses. If result is PRESUMPTIVE POSTIVE SARS-CoV-2 nucleic acids MAY BE PRESENT.   A presumptive positive result was obtained on the submitted specimen  and confirmed on repeat testing.  While 2019 novel coronavirus  (SARS-CoV-2) nucleic acids may be present in the submitted sample  additional confirmatory testing may be necessary for epidemiological  and / or clinical management purposes  to differentiate between  SARS-CoV-2 and other Sarbecovirus currently known to infect humans.  If clinically indicated additional testing with an alternate test  methodology [phone]) is advised. The SARS-CoV-2 RNA is generally  detectable in upper and lower respiratory sp ecimens during the acute  phase of infection. The expected result is Negative. Fact Sheet for Patients:  StrictlyIdeas.no Fact Sheet for Healthcare Providers: BankingDealers.co.za This test is not yet approved or cleared by the [place] FDA and has been authorized for detection and/or diagnosis of SARS-CoV-2 by FDA under an Emergency Use Authorization (EUA).  This EUA will remain in effect (meaning this test can be used) for the duration of the COVID-19 declaration under Section 564(b)(1) of the Act, 21  U.S.C. section 360bbb-3(b)(1), unless the authorization is terminated or revoked sooner. Performed at [facility], [place] [address] [place], [place] [address]   MRSA PCR Screening     Status: None   Collection Time: 10/03/19  1:26 AM   Specimen: Nasopharyngeal  Result Value Ref Range Status   MRSA by PCR NEGATIVE NEGATIVE Final    Comment:        The GeneXpert MRSA Assay (FDA approved for NASAL specimens only), is one component of a comprehensive MRSA colonization surveillance program. It is not intended to diagnose MRSA infection nor to guide or monitor treatment for MRSA infections. Performed at [facility], [place] [address] [place], [place] [address]     Radiology Reports Dg Chest 2 View  Result Date: 10/02/2019 CLINICAL DATA:  Pt reports generalized weakness and some nausea today. Smoker. EXAM: CHEST - 2 VIEW COMPARISON:  Chest radiograph 12/29/2016 FINDINGS: The heart size and mediastinal contours are within normal limits. The lungs are clear. No pneumothorax or pleural effusion. No acute finding in the visualized skeleton. Multilevel degenerative changes in the thoracic spine. IMPRESSION: No evidence of active disease in the chest. Electronically Signed   By: Audie Pinto M.D.   On: 10/02/2019 17:36   US Pelvic Complete With Transvaginal  Result Date: 10/03/2019 CLINICAL DATA:  Positive urine pregnancy test. Hysterectomy. Normal b HCG EXAM: TRANSABDOMINAL AND TRANSVAGINAL ULTRASOUND OF PELVIS DOPPLER ULTRASOUND OF OVARIES TECHNIQUE: Both transabdominal and transvaginal ultrasound examinations of the pelvis were performed. Transabdominal technique was performed for global imaging of the pelvis including uterus, ovaries, adnexal regions, and pelvic cul-de-sac. It was necessary to proceed with endovaginal exam following the transabdominal exam to visualize the ovaries. Color and duplex Doppler ultrasound was utilized to evaluate blood  flow to the ovaries. COMPARISON:  None. FINDINGS: Uterus Measurements: Post hysterectomy = volume: mL. No fibroids or other mass visualized. Endometrium Thickness: Post hysterectomy.  No focal abnormality visualized. Right ovary Measurements: 2.5 x 1.4 x 1.2 cm = normal size.  Normal appear Left ovary Measurements: Overlying bowel gas.  Not identified. Pulsed Doppler evaluation of RIGHT ovary demonstrates normal low-resistance arterial and venous waveforms. Other findings No abnormal free fluid. IMPRESSION: 1. No ectopic pregnancy identified. 2. Post hysterectomy. 3. Normal RIGHT ovary. 4. LEFT ovary not identified.  Overlying bowel gas. 5. Normal beta HCG Electronically Signed  By: Suzy Bouchard M.D.   On: 10/03/2019 09:54   US Abdomen Limited Ruq  Result Date: 10/02/2019 CLINICAL DATA:  Elevated LFTs EXAM: ULTRASOUND ABDOMEN LIMITED RIGHT UPPER QUADRANT COMPARISON:  None. FINDINGS: Gallbladder: No gallstones or wall thickening visualized. No sonographic Murphy sign noted by sonographer. Common bile duct: Diameter: 3 mm Liver: No focal lesion identified. Within normal limits in parenchymal echogenicity. Portal vein is patent on color Doppler imaging with normal direction of blood flow towards the liver. Other: None. IMPRESSION: Normal study. Electronically Signed   By: Constance Holster M.D.   On: 10/02/2019 22:36    Lab Data:  CBC: Recent Labs  Lab 10/02/19 1738 10/03/19 0633  WBC 17.3* 14.1*  NEUTROABS 14.8*  --   HGB 16.7* 13.8  HCT 51.7* 40.5  MCV 86.2 82.8  PLT 445* [id number]   Basic Metabolic Panel: Recent Labs  Lab 10/02/19 1738 10/03/19 0122 10/03/19 0633  NA 131* 138 139  K 6.3* 4.3 3.7  CL 91* 112* 111  CO2 9* 10* 17*  GLUCOSE 900* 317* 165*  BUN 34* 30* 24*  CREATININE 2.16* 1.32* 0.89  CALCIUM 9.8 9.2 8.7*   GFR: CrCl cannot be calculated (Unknown ideal weight.). Liver Function Tests: Recent Labs  Lab 10/02/19 1738 10/03/19 0633  AST 13* 9*  ALT 17 11   ALKPHOS 132* 84  BILITOT 1.8* 1.3*  PROT 9.1* 6.7  ALBUMIN 5.1* 3.7   Recent Labs  Lab 10/02/19 1738  LIPASE 71*   No results for input(s): AMMONIA in the last 168 hours. Coagulation Profile: No results for input(s): INR, PROTIME in the last 168 hours. Cardiac Enzymes: Recent Labs  Lab 10/02/19 1738  CKTOTAL 46   BNP (last 3 results) No results for input(s): PROBNP in the last 8760 hours. HbA1C: Recent Labs    10/02/19 1738  HGBA1C 14.9*   CBG: Recent Labs  Lab 10/03/19 0511 10/03/19 0607 10/03/19 0701 10/03/19 0801 10/03/19 0900  GLUCAP 180* 155* 157* 162* 145*   Lipid Profile: No results for input(s): CHOL, HDL, LDLCALC, TRIG, CHOLHDL, LDLDIRECT in the last 72 hours. Thyroid Function Tests: Recent Labs    10/02/19 1738 10/02/19 2126  TSH 0.014*  --   FREET4  --  1.10   Anemia Panel: No results for input(s): VITAMINB12, FOLATE, FERRITIN, TIBC, IRON, RETICCTPCT in the last 72 hours. Urine analysis:    Component Value Date/Time   COLORURINE YELLOW 10/02/2019 1738   APPEARANCEUR CLEAR 10/02/2019 1738   LABSPEC 1.022 10/02/2019 1738   PHURINE 5.0 10/02/2019 1738   GLUCOSEU >=500 (A) 10/02/2019 1738   HGBUR MODERATE (A) 10/02/2019 1738   BILIRUBINUR NEGATIVE 10/02/2019 1738   KETONESUR 80 (A) 10/02/2019 1738   PROTEINUR 100 (A) 10/02/2019 1738   UROBILINOGEN 0.2 09/26/2019 1547   NITRITE NEGATIVE 10/02/2019 1738   LEUKOCYTESUR NEGATIVE 10/02/2019 1738     Angelys Yetman M.D. [facility] 10/03/2019, 10:07 AM  Pager: [phone] Between 7am to 7pm - call Pager - [phone]  After 7pm go to [email or web address] - password [email or web address]  Call night coverage person covering after 7pm

## 2019-10-04 DIAGNOSIS — E0810 Diabetes mellitus due to underlying condition with ketoacidosis without coma: Secondary | ICD-10-CM

## 2019-10-04 LAB — BASIC METABOLIC PANEL
Anion gap: 11 (ref 5–15)
BUN: 15 mg/dL (ref 6–20)
CO2: 23 mmol/L (ref 22–32)
Calcium: 8.5 mg/dL — ABNORMAL LOW (ref 8.9–10.3)
Chloride: 103 mmol/L (ref 98–111)
Creatinine, Ser: 0.77 mg/dL (ref 0.44–1.00)
GFR calc Af Amer: >60 mL/min (ref >60)
GFR calc non Af Amer: >60 mL/min (ref >60)
Glucose, Bld: 288 mg/dL — ABNORMAL HIGH (ref 70–99)
Potassium: 3.3 mmol/L — ABNORMAL LOW (ref 3.5–5.1)
Sodium: 137 mmol/L (ref 135–145)

## 2019-10-04 LAB — CBC
HCT: 36.8 % (ref 36.0–46.0)
Hemoglobin: 12.4 g/dL (ref 12.0–15.0)
MCH: 28.1 pg (ref 26.0–34.0)
MCHC: 33.7 g/dL (ref 30.0–36.0)
MCV: 83.4 fL (ref 80.0–100.0)
Platelets: 237 10*3/uL (ref 150–400)
RBC: 4.41 MIL/uL (ref 3.87–5.11)
RDW: 13.5 % (ref 11.5–15.5)
WBC: 10.6 10*3/uL — ABNORMAL HIGH (ref 4.0–10.5)
nRBC: 0 % (ref 0.0–0.2)

## 2019-10-04 LAB — T3: T3, Total: 89 ng/dL (ref 71–180)

## 2019-10-04 LAB — T3, FREE: T3, Free: 2.2 pg/mL (ref 2.0–4.4)

## 2019-10-04 LAB — GLUCOSE, CAPILLARY
Glucose-Capillary: 338 mg/dL — ABNORMAL HIGH (ref 70–99)
Glucose-Capillary: 169 mg/dL — ABNORMAL HIGH (ref 70–99)

## 2019-10-04 MED ORDER — INSULIN ASPART 100 UNIT/ML ~~LOC~~ SOLN: 5.0000 [IU] | Freq: Three times a day (TID) | SUBCUTANEOUS | Status: DC

## 2019-10-04 MED ORDER — INSULIN GLARGINE 100 UNIT/ML ~~LOC~~ SOLN
25.0000 [IU] | Freq: Every day | SUBCUTANEOUS | Status: DC
  Administered 2019-10-04: 25 [IU] via SUBCUTANEOUS
  Filled 2019-10-04: qty 0.25

## 2019-10-04 MED ORDER — POTASSIUM CHLORIDE CRYS ER 20 MEQ PO TBCR: 40.0000 meq | EXTENDED_RELEASE_TABLET | Freq: Once | ORAL | Status: DC

## 2019-10-04 MED ORDER — ACETAMINOPHEN 325 MG PO TABS
650.0000 mg | ORAL_TABLET | Freq: Four times a day (QID) | ORAL | Status: DC | PRN
  Administered 2019-10-04: 650 mg via ORAL
  Filled 2019-10-04: qty 2

## 2019-10-04 MED ORDER — NOVOLOG FLEXPEN 100 UNIT/ML ~~LOC~~ SOPN: 5.0000 [IU] | PEN_INJECTOR | Freq: Three times a day (TID) | SUBCUTANEOUS | 11 refills | Status: DC

## 2019-10-04 MED ORDER — INSULIN ASPART 100 UNIT/ML ~~LOC~~ SOLN
4.0000 [IU] | Freq: Three times a day (TID) | SUBCUTANEOUS | Status: DC
  Administered 2019-10-04 (×2): 4 [IU] via SUBCUTANEOUS

## 2019-10-04 MED ORDER — METRONIDAZOLE 500 MG PO TABS: 500.0000 mg | ORAL_TABLET | Freq: Two times a day (BID) | ORAL | 0 refills | Status: AC

## 2019-10-04 MED ORDER — INSULIN ASPART 100 UNIT/ML ~~LOC~~ SOLN
0.0000 [IU] | Freq: Three times a day (TID) | SUBCUTANEOUS | Status: DC
  Administered 2019-10-04: 3 [IU] via SUBCUTANEOUS
  Administered 2019-10-04: 11 [IU] via SUBCUTANEOUS

## 2019-10-04 MED ORDER — LANTUS SOLOSTAR 100 UNIT/ML ~~LOC~~ SOPN: 25.0000 [IU] | PEN_INJECTOR | Freq: Every day | SUBCUTANEOUS | 4 refills | Status: DC

## 2019-10-04 MED FILL — !NOVOLOG FLEXPEN SYRINGE 1: 100/ML | 20 days supply | Qty: 3 | Fill #0

## 2019-10-04 MED FILL — !LANTUS SOLOSTAR 100UNITS/M: 100 | 24 days supply | Qty: 6 | Fill #0

## 2019-10-04 NOTE — Discharge Summary (Signed)
Physician Discharge Summary   Patient ID: Whitney Benton MRN: [id number] DOB/AGE: 49-28-1972 49 y.o.  Admit date: 10/02/2019 Discharge date: 10/04/2019  Primary Care Physician:  Ladell Pier, MD   Recommendations for Outpatient Follow-up:  1. Follow up with PCP in 10 to 14 days 2. Hemoglobin A1c 14.9 3.  Metformin discontinued, Lantus increased to 25 units at bedtime, placed on NovoLog meal coverage 5 units 3 times daily AC.  Patient recommended to make a log of her blood sugar readings and bring to her PCP for further adjustment of her insulin regimen.  Home Health: None none Equipment/Devices: None  Discharge Condition: stable CODE STATUS: FULL  Diet recommendation: Carb modified diet   Discharge Diagnoses:    . DKA (diabetic ketoacidoses) ([place]) Uncontrolled diabetes mellitus IDDM with hyperglycemia Hyponatremia Severe anion gap hypochloremic metabolic acidosis with bicarb of 9 at the time of admission Hyperkalemia Acute kidney injury Mildly elevated lipase, abnormal LFTs solved   Consults: None    Allergies:   Allergies  Allergen Reactions  . Aspirin Hives  . Oxycodone Nausea And Vomiting     DISCHARGE MEDICATIONS: Allergies as of 10/04/2019      Reactions   Aspirin Hives   Oxycodone Nausea And Vomiting      Medication List    STOP taking these medications   metFORMIN 500 MG 24 hr tablet Commonly known as: GLUCOPHAGE-XR     TAKE these medications   atorvastatin 20 MG tablet Commonly known as: LIPITOR Take 1 tablet (20 mg total) by mouth daily.   cetirizine 10 MG tablet Commonly known as: ZYRTEC Take 1 tablet (10 mg total) by mouth daily.   DULoxetine 20 MG capsule Commonly known as: Cymbalta Take 1 capsule (20 mg total) by mouth daily.   glucose blood test strip Check blood sugars three times a day   Lantus SoloStar 100 UNIT/ML Solostar Pen Generic drug: Insulin Glargine Inject 25 Units into the skin at bedtime. What changed:    how much to take  when to take this   metroNIDAZOLE 500 MG tablet Commonly known as: FLAGYL Take 1 tablet (500 mg total) by mouth 2 (two) times daily for 7 days. Until 10/06/2019 What changed: additional instructions   NovoLOG FlexPen 100 UNIT/ML FlexPen Generic drug: insulin aspart Inject 5 Units into the skin 3 (three) times daily with meals.   omeprazole 20 MG capsule Commonly known as: PRILOSEC Take 1 capsule (20 mg total) by mouth daily.   Pen Needles 31G X 6 MM Misc Use as directed   SUMAtriptan 50 MG tablet Commonly known as: Imitrex Take 1 tab at the start of the headache.  If no improvement, may repeat dose in 2 hours.  Max 2 tabs/24-hour..   topiramate 50 MG tablet Commonly known as: Topamax Take 1 tablet (50 mg total) by mouth 2 (two) times daily.   True Metrix Meter w/Device Kit Check blood sugars three times a day   TRUEplus Lancets 28G Misc Check blood sugars three time a day   Vitamin D (Ergocalciferol) 1.25 MG (50000 UT) Caps capsule Commonly known as: DRISDOL Take 1 capsule (50,000 Units total) by mouth every 7 (seven) days.        Brief H and P: For complete details please refer to admission H and P, but in brief Patient is a 48 year old female with history of IDDM, anxiety, depression presented with nausea and vomiting.  Patient initially was evaluated at urgent care and was found to be tachycardic and hyperglycemic.  Patient reported that she has been vomiting for the past 4 days and unable to keep anything down.  No abdominal pain.  Patient reports that she normally takes Lantus 20 units daily however has not been using it recently as she has been busy taking care of family member who was admitted to the ICU. In ED, EKG with peaked T waves, blood glucose 900, potassium 6.3 Urine pregnancy test positive.  TSH low 0.014.  Patient was started on insulin infusion and received IV fluids COVID-19 test negative   Hospital Course:   Severe DKA  (diabetic ketoacidoses) ([place]) -Patient presented with severe DKA, anion gap 31, glucose 900, bicarb 9, chloride 91, potassium 6.3, sodium 131.  pH 7.0.  UA positive for ketones - Patient was placed on IV insulin drip, aggressive IV fluid hydration.  Once CBGs were controlled, gap was closed, bicarb above 18, patient was transitioned to subcutaneous insulin.   Diabetes mellitus type 2, uncontrolled, IDDM with hyperglycemia -Hemoglobin A1c 14.9 -Counseled to be compliant with her medications especially insulin -Diabetic coordinator consult was obtained, patient received extensive teaching on insulin.  - Patient has been extremely busy with caring for her family, recommended to take care of herself as hemoglobin A1c is very high and had been trending up for the last few months. -Lantus was increased to 25 units at bedtime, added NovoLog 5 units 3 times daily AC.  Metformin discontinued.  She was recommended to keep a log of her blood sugars and bring them to the PCP for further adjustment of the insulin regimen.    AKI (acute kidney injury) ([place]) -Likely due to profound dehydration, DKA, creatinine 2.1 at the time of admission -Creatinine improved, 0.7 at the time of discharge    Hyperkalemia -Secondary to severe DKA, potassium 6.3 at the time of admission with peak T waves on EKG -Potassium 3.3 at the time of admission, replaced     Leukocytosis -Likely stress demargination, no obvious source of infection, COVID-19 test negative -UA negative for UTI    Abnormal LFTs, mild elevated lipase Likely due to acute DKA, resolved   Urine pregnancy test positive Patient reports history of hysterectomy.   OB pelvic ultrasound negative for any ectopic pregnancy.  Beta hCG normal   Day of Discharge S: Feels a whole lot better, no acute complaints.  No fevers or chills, no nausea or vomiting.  BP 125/72 (BP Location: Left Arm)   Pulse 77   Temp 98.2 F (36.8 C) (Oral)   Resp 16    SpO2 99%   Physical Exam: General: Alert and awake oriented x3 not in any acute distress. HEENT: anicteric sclera, pupils reactive to light and accommodation CVS: S1-S2 clear no murmur rubs or gallops Chest: clear to auscultation bilaterally, no wheezing rales or rhonchi Abdomen: soft nontender, nondistended, normal bowel sounds Extremities: no cyanosis, clubbing or edema noted bilaterally Neuro: Cranial nerves II-XII intact, no focal neurological deficits   The results of significant diagnostics from this hospitalization (including imaging, microbiology, ancillary and laboratory) are listed below for reference.      Procedures/Studies:  Dg Chest 2 View  Result Date: 10/02/2019 CLINICAL DATA:  Pt reports generalized weakness and some nausea today. Smoker. EXAM: CHEST - 2 VIEW COMPARISON:  Chest radiograph 12/29/2016 FINDINGS: The heart size and mediastinal contours are within normal limits. The lungs are clear. No pneumothorax or pleural effusion. No acute finding in the visualized skeleton. Multilevel degenerative changes in the thoracic spine. IMPRESSION: No evidence of active disease  in the chest. Electronically Signed   By: Audie Pinto M.D.   On: 10/02/2019 17:36   US Pelvic Complete With Transvaginal  Result Date: 10/03/2019 CLINICAL DATA:  Positive urine pregnancy test. Hysterectomy. Normal b HCG EXAM: TRANSABDOMINAL AND TRANSVAGINAL ULTRASOUND OF PELVIS DOPPLER ULTRASOUND OF OVARIES TECHNIQUE: Both transabdominal and transvaginal ultrasound examinations of the pelvis were performed. Transabdominal technique was performed for global imaging of the pelvis including uterus, ovaries, adnexal regions, and pelvic cul-de-sac. It was necessary to proceed with endovaginal exam following the transabdominal exam to visualize the ovaries. Color and duplex Doppler ultrasound was utilized to evaluate blood flow to the ovaries. COMPARISON:  None. FINDINGS: Uterus Measurements: Post  hysterectomy = volume: mL. No fibroids or other mass visualized. Endometrium Thickness: Post hysterectomy.  No focal abnormality visualized. Right ovary Measurements: 2.5 x 1.4 x 1.2 cm = normal size.  Normal appear Left ovary Measurements: Overlying bowel gas.  Not identified. Pulsed Doppler evaluation of RIGHT ovary demonstrates normal low-resistance arterial and venous waveforms. Other findings No abnormal free fluid. IMPRESSION: 1. No ectopic pregnancy identified. 2. Post hysterectomy. 3. Normal RIGHT ovary. 4. LEFT ovary not identified.  Overlying bowel gas. 5. Normal beta HCG Electronically Signed   By: Suzy Bouchard M.D.   On: 10/03/2019 09:54   US Abdomen Limited Ruq  Result Date: 10/02/2019 CLINICAL DATA:  Elevated LFTs EXAM: ULTRASOUND ABDOMEN LIMITED RIGHT UPPER QUADRANT COMPARISON:  None. FINDINGS: Gallbladder: No gallstones or wall thickening visualized. No sonographic Murphy sign noted by sonographer. Common bile duct: Diameter: 3 mm Liver: No focal lesion identified. Within normal limits in parenchymal echogenicity. Portal vein is patent on color Doppler imaging with normal direction of blood flow towards the liver. Other: None. IMPRESSION: Normal study. Electronically Signed   By: Constance Holster M.D.   On: 10/02/2019 22:36       LAB RESULTS: Basic Metabolic Panel: Recent Labs  Lab 10/03/19 0957 10/04/19 0355  NA 139 137  K 3.3* 3.3*  CL 108 103  CO2 20* 23  GLUCOSE 171* 288*  BUN 20 15  CREATININE 0.81 0.77  CALCIUM 8.5* 8.5*   Liver Function Tests: Recent Labs  Lab 10/02/19 1738 10/03/19 0633  AST 13* 9*  ALT 17 11  ALKPHOS 132* 84  BILITOT 1.8* 1.3*  PROT 9.1* 6.7  ALBUMIN 5.1* 3.7   Recent Labs  Lab 10/02/19 1738  LIPASE 71*   No results for input(s): AMMONIA in the last 168 hours. CBC: Recent Labs  Lab 10/02/19 1738 10/03/19 0633 10/04/19 0355  WBC 17.3* 14.1* 10.6*  NEUTROABS 14.8*  --   --   HGB 16.7* 13.8 12.4  HCT 51.7* 40.5 36.8   MCV 86.2 82.8 83.4  PLT 445* 311 237   Cardiac Enzymes: Recent Labs  Lab 10/02/19 1738  CKTOTAL 46   BNP: Invalid input(s): POCBNP CBG: Recent Labs  Lab 10/04/19 0727 10/04/19 1155  GLUCAP 338* 169*      Disposition and Follow-up: Discharge Instructions    Diet Carb Modified   Complete by: As directed    Discharge instructions   Complete by: As directed    It is VERY IMPORTANT that you follow up with a PCP/Dr Wynetta Emery on a regular basis.  Check your blood glucoses before each meal and at bedtime and maintain a log of your readings.  Bring this log with you when you follow up with your PCP so that she can adjust your insulin at your follow up visit.   Increase  activity slowly   Complete by: As directed        DISPOSITION: [place]    Ladell Pier, MD. Schedule an appointment as soon as possible for a visit in 10 day(s).   Specialty: Internal Medicine Why: Please bring the log of the blood sugars to Dr. Wynetta Emery for further adjustment in the insulin regimen Contact information: [place] [address] [phone]            Time coordinating discharge:  35 minutes  Signed:   Estill Cotta M.D. [facility] 10/04/2019, 1:32 PM

## 2019-10-04 NOTE — Progress Notes (Signed)
Discharge instrcutions given to patient. Patient had no questions regarding her discharge instructions. Writer wheel patient out

## 2019-10-04 NOTE — Progress Notes (Addendum)
Inpatient Diabetes Program Recommendations  AACE/ADA: New Consensus Statement on Inpatient Glycemic Control (2015)  Target Ranges:  Prepandial:   less than 140 mg/dL      Peak postprandial:   less than 180 mg/dL (1-2 hours)      Critically ill patients:  140 - 180 mg/dL   Results for Whitney Benton, Whitney Benton (MRN [id number]) as of 10/04/2019 08:05  Ref. Range 10/02/2019 17:38  Glucose Latest Ref Range: 70 - 99 mg/dL [facility])   Results for Whitney Benton, Whitney Benton (MRN [id number]) as of 10/04/2019 08:05  Ref. Range 01/06/2019 11:33 07/14/2019 10:58 10/02/2019 17:38  Hemoglobin A1C Latest Ref Range: 4.8 - 5.6 % 11.7 (H) 13.0 (H) 14.9 (H)  (380 mg/dl)   Results for Whitney Benton, Whitney Benton (MRN [id number]) as of 10/04/2019 08:05  Ref. Range 10/03/2019 09:00 10/03/2019 10:04 10/03/2019 11:02 10/03/2019 12:06 10/03/2019 13:11 10/03/2019 14:14 10/03/2019 17:37 10/03/2019 21:10  Glucose-Capillary Latest Ref Range: 70 - 99 mg/dL 145 (H)  IV Insulin Drip 154 (H)  IV Insulin Drip 149 (H)  IV Insulin Drip 250 (H)  IV Insulin Drip 99  IV Insulin Drip 109 (H)  IV Insulin Drip OFF +  20 units LANTUS given at 2:30pm 314 (H)  7 units NOVOLOG  400 (H)  5 units NOVOLOG    Results for Whitney Benton, Whitney Benton (MRN [id number]) as of 10/04/2019 08:05  Ref. Range 10/04/2019 07:27  Glucose-Capillary Latest Ref Range: 70 - 99 mg/dL 338 (H)  15 units NOVOLOG      Admit with: DKA (stopped taking Lantus at home due to taking care of other family members who are sick)  History: DM  Home DM Meds: Lantus 20 units Daily       Metformin 500 mg BID  Current Orders: Lantus 25 units Daily      Novolog Moderate Correction Scale/ SSI (0-15 units) TID AC + HS      Novolog 4 units TID with meals    PCP: Dr. Karle Plumber with the [facility]--last seen by Telemedicine visit on 08/06/2019--Lantus dose was increased to 20 units Daily at that visit--Has taken Trulicity in the past and PCP decided not to restart  yet due to pt's decreased appetite at home    MD- Note Lantus increased to 25 units daily this AM and Novolog 4 units Meal Coverage also added this AM.  Agree with adjustments.  Plan to speak with pt today to discuss elevated A1c.  Since her A1c is so high, pt may benefit from a simple Novolog SSI regimen at home TID with meals until she can get back with her PCP for further medication adjustments.    Addendum 11:30am- Met w/ pt today to discuss her elevated A1c of 14.9% and her home DM care regimen.  Pt told me she takes care of her Mom and Dad (lives one house over), her own family which includes 3 children, and she also cares for her 13 nieces and nephews.  Has little time to care for herself.  Her father is currently in the ICU here at [facility] and she would like to go down to visit him.  Told me she inquired about restarting Trulicity at home with her PCP back in August but that there PCP wouldn't restart b/c her PCP thought she was having issues with her eating/appetite at home, however, pt stated she only had nausea for 1 week and has not had nausea since.  Really wants to try to get back on the  Trulicity.  Explained what an A1c is and what it measures.  Reminded patient that her goal A1c is 7% or less per ADA standards to prevent both acute and long-term complications.  Explained to patient the extreme importance of good glucose control at home.  Encouraged patient to check her CBGs at least TID AC at home and to record all CBGs in a logbook for her PCP to review.  Pt told me that Dr. Tana Coast told her she would likely need to take her insulin TID at home.  I explained and clarified to pt that Dr. Tana Coast was talking about going home on an additional insulin called Novolog and that she would take the Novolog TID before meals along with her Lantus once daily (if Dr. Tana Coast decides to send her home on Novolog TID).  I explained what Novolog is, how it works, when to take, how to take, etc.  I also explained that  her Lantus would remain once daily.  Pt open to taking an additional insulin if needed.  Encouraged close follow up with her PCP at the [facility] clinic after d/c.     --Will follow patient during hospitalization--  Wyn Quaker RN, MSN, CDE Diabetes Coordinator Inpatient Glycemic Control Team Team Pager: [phone] (8a-5p)

## 2019-10-04 NOTE — Discharge Instructions (Signed)
°Carbohydrate Counting For People With Diabetes ° °Why Is Carbohydrate Counting Important? °• Counting carbohydrate servings may help you control your blood glucose level so that you feel better.  °• The balance between the carbohydrates you eat and insulin determines what your blood glucose level will be after eating.  °• Carbohydrate counting can also help you plan your meals. °Which Foods Have Carbohydrates? °Foods with carbohydrates include: °• Breads, crackers, and cereals  °• Pasta, rice, and grains  °• Starchy vegetables, such as potatoes, corn, and peas  °• Beans and legumes  °• Milk, soy milk, and yogurt  °• Fruits and fruit juices  °• Sweets, such as cakes, cookies, ice cream, jam, and jelly °Carbohydrate Servings °In diabetes meal planning, 1 serving of a food with carbohydrate has about 15 grams of carbohydrate: °• Check serving sizes with measuring cups and spoons or a food scale.  °• Read the Nutrition Facts on food labels to find out how many grams of carbohydrate are in foods you eat. °The food lists in this handout show portions that have about 15 grams of carbohydrate. ° °Tips °Meal Planning Tips °• An Eating Plan tells you how many carbohydrate servings to eat at your meals and snacks. For many adults, eating 3 to 5 servings of carbohydrate foods at each meal and 1 or 2 carbohydrate servings for each snack works well.  °• In a healthy daily Eating Plan, most carbohydrates come from:  °• At least 6 servings of fruits and nonstarchy vegetables  °• At least 6 servings of grains, beans, and starchy vegetables, with at least 3 servings from whole grains  °• At least 2 servings of milk or milk products °• Check your blood glucose level regularly. It can tell you if you need to adjust when you eat carbohydrates.  °• Eating foods that have fiber, such as whole grains, and having very few salty foods is good for your health.  °• Eat 4 to 6 ounces of meat or other protein foods (such as soybean burgers)  each day. Choose low-fat sources of protein, such as lean beef, lean pork, chicken, fish, low-fat cheese, or vegetarian foods such as soy.  °• Eat some healthy fats, such as olive oil, canola oil, and nuts.  °• Eat very little saturated fats. These unhealthy fats are found in butter, cream, and high-fat meats, such as bacon and sausage.  °• Eat very little or no trans fats. These unhealthy fats are found in all foods that list “partially hydrogenated oil” as an ingredient.  °Label Reading Tips °The Nutrition Facts panel on a label lists the grams of total carbohydrate in 1 standard serving. The label's standard serving may be larger or smaller than 1 carbohydrate serving. °To figure out how many carbohydrate servings are in the food: °• First, look at the label's standard serving size.  °• Check the grams of total carbohydrate. This is the amount of carbohydrate in 1 standard serving.  °• Divide the grams of total carbohydrate by 15. This number equals the number of carbohydrate servings in 1 standard serving. Remember: 1 carbohydrate serving is 15 grams of carbohydrate.  °• Note: You may ignore the grams of sugars on the Nutrition Facts panel because they are included in the grams of total carbohydrate. ° °Foods Recommended °1 serving = about 15 grams of carbohydrate °Starches °• 1 slice bread (1 ounce)  °• 1 tortilla (6-inch size)  °• ¼ large bagel (1 ounce)  °• 2 taco shells (5-inch   size)  °• ½ hamburger or hot dog bun (¾ ounce)  °• ¾ cup ready-to-eat unsweetened cereal  °• ½ cup cooked cereal  °• 1 cup broth-based soup  °• 4 to 6 small crackers  °• 1/3 cup pasta or rice (cooked)  °• ½ cup beans, peas, corn, sweet potatoes, winter squash, or mashed or boiled potatoes (cooked)  °• ¼ large baked potato (3 ounces)  °• ¾ ounce pretzels, potato chips, or tortilla chips  °• 3 cups popcorn (popped) °Fruit °• 1 small fresh fruit (¾ to 1 cup)  °• ½ cup canned or frozen fruit  °• 2 tablespoons dried fruit (blueberries,  cherries, cranberries, mixed fruit, raisins)  °• 17 small grapes (3 ounces)  °• 1 cup melon or berries  °• ½ cup unsweetened fruit juice °Milk °• 1 cup fat-free or reduced-fat milk  °• 1 cup soy milk  °• 2/3 cup (6 ounces) nonfat yogurt sweetened with sugar-free sweetener °Sweets and Desserts °• 2-inch square cake (unfrosted)  °• 2 small cookies (2/3 ounce)  °• ½ cup ice cream or frozen yogurt  °• ¼ cup sherbet or sorbet  °• 1 tablespoon syrup, jam, jelly, table sugar, or honey  °• 2 tablespoons light syrup °Other Foods °• Count 1 cup raw vegetables or ½ cup cooked nonstarchy vegetables as zero (0) carbohydrate servings or “free” foods. If you eat 3 or more servings at one meal, count them as 1 carbohydrate serving.  °• Foods that have less than 20 calories in each serving also may be counted as zero carbohydrate servings or “free” foods.  °• Count 1 cup of casserole or other mixed foods as 2 carbohydrate servings. ° °Carbohydrate Counting for People with Diabetes Sample 1-Day Menu  °Breakfast 1 extra-small banana (1 carbohydrate serving)  °1 cup low-fat or fat-free milk (1 carbohydrate serving)  °1 slice whole wheat bread (1 carbohydrate serving)  °1 teaspoon margarine  °Lunch 2 ounces turkey slices  °2 slices whole wheat bread (2 carbohydrate servings)  °2 lettuce leaves  °4 celery sticks  °4 carrot sticks  °1 medium apple (1 carbohydrate serving)  °1 cup low-fat or fat-free milk (1 carbohydrate serving)  °Afternoon Snack 2 tablespoons raisins (1 carbohydrate serving)  °3/4 ounce unsalted mini pretzels (1 carbohydrate serving)  °Evening Meal 3 ounces lean roast beef  °1/2 large baked potato (2 carbohydrate servings)  °1 tablespoon reduced-fat sour cream  °1/2 cup green beans  °1 tablespoon light salad dressing  °1 whole wheat dinner roll (1 carbohydrate serving)  °1 teaspoon margarine  °1 cup melon balls (1 carbohydrate serving)  °Evening Snack 2 tablespoons unsalted nuts  ° °Carbohydrate Counting for People with  Diabetes Vegan Sample 1-Day Menu  °Breakfast 1 cup cooked oatmeal (2 carbohydrate servings)  °½ cup blueberries (1 carbohydrate serving)  °2 tablespoons flaxseeds  °1 cup soymilk fortified with calcium and vitamin D  °1 cup coffee  °Lunch 2 slices whole wheat bread (2 carbohydrate servings)  °½ cup baked tofu  °¼ cup lettuce  °2 slices tomato  °2 slices avocado  °½ cup baby carrots  °1 orange (1 carbohydrate serving)  °1 cup soymilk fortified with calcium and vitamin D   °Evening Meal Burrito made with: 1 6-inch corn tortilla (1 carbohydrate serving)  °1 cup refried vegetarian beans (1 carbohydrate serving)  °¼ cup chopped tomatoes  °¼ cup lettuce  °¼ cup salsa  °1/3 cup Crapps rice (1 carbohydrate serving)  °1 tablespoon olive oil for rice  °½   cup zucchini   °Evening Snack 6 small whole grain crackers (1 carbohydrate serving)  °2 apricots (½ carbohydrate serving)  °¼ cup unsalted peanuts (½ carbohydrate serving)   ° ° °Carbohydrate Counting for People with Diabetes Vegetarian (Lacto-Ovo) Sample 1-Day Menu  °Breakfast 1 cup cooked oatmeal (2 carbohydrate servings)  °½ cup blueberries (1 carbohydrate serving)  °2 tablespoons flaxseeds  °1 egg  °1 cup 1% milk (1 carbohydrate serving)  °1 cup coffee  °Lunch 2 slices whole wheat bread (2 carbohydrate servings)  °2 ounces low-fat cheese  °¼ cup lettuce  °2 slices tomato  °2 slices avocado  °½ cup baby carrots  °1 orange (1 carbohydrate serving)  °1 cup unsweetened tea  °Evening Meal Burrito made with: 1 6-inch corn tortilla (1 carbohydrate serving)  °½ cup refried vegetarian beans (1 carbohydrate serving)  °¼ cup tomatoes  °¼ cup lettuce  °¼ cup salsa  °1/3 cup Griep rice (1 carbohydrate serving)  °1 tablespoon olive oil for rice  °½ cup zucchini  °1 cup 1% milk (1 carbohydrate serving)  °Evening Snack 6 small whole grain crackers (1 carbohydrate serving)  °2 apricots (½ carbohydrate serving)  °¼ cup unsalted peanuts (½ carbohydrate serving)   ° °Copyright 2020 © Academy  of [employer] and Dietetics. All rights reserved. ° °Using Nutrition Labels: Carbohydrate ° °• Serving Size  °• Look at the serving size. All the information on the label is based on this portion. °• Servings Per Container  °• The number of servings contained in the package. °• Guidelines for Carbohydrate  °• Look at the total grams of carbohydrate in the serving size.  °• 1 carbohydrate choice = 15 grams of carbohydrate. °Range of Carbohydrate Grams Per Choice  °Carbohydrate Grams/Choice Carbohydrate Choices  °6-10 ½  °11-20 1  °21-25 1½  °26-35 2  °36-40 2½  °41-50 3  °51-55 3½  °56-65 4  °66-70 4½  °71-80 5  ° ° °Copyright 2020 © Academy of [employer] and Dietetics. All rights reserved. ° °

## 2019-10-12 ENCOUNTER — Telehealth: Payer: Self-pay | Admitting: Internal Medicine

## 2019-10-12 MED ORDER — TRAZODONE HCL 50 MG PO TABS: 50.0000 mg | ORAL_TABLET | Freq: Every evening | ORAL | 0 refills | Status: DC | PRN

## 2019-10-12 MED FILL — traZODone HCL 50 MG TABS: 50 | 30 days supply | Qty: 30 | Fill #0

## 2019-10-12 MED FILL — DULoxetine HCL 20 MG CPEP: 20 | 30 days supply | Qty: 30 | Fill #2

## 2019-10-12 MED FILL — ?ATORVASTATIN 20 MG TABLET: 20 | 30 days supply | Qty: 30 | Fill #5

## 2019-10-12 NOTE — Telephone Encounter (Signed)
Patient came in stating she lost her father last week and she cant sleep. She would like something for sleep

## 2019-10-20 MED FILL — !NOVOLOG FLEXPEN SYRINGE 1: 100/ML | 20 days supply | Qty: 3 | Fill #1

## 2019-11-02 ENCOUNTER — Other Ambulatory Visit: Payer: Self-pay

## 2019-11-02 ENCOUNTER — Ambulatory Visit: Payer: Self-pay | Attending: Family Medicine | Admitting: Family Medicine

## 2019-11-02 ENCOUNTER — Encounter: Payer: Self-pay | Admitting: Family Medicine

## 2019-11-02 VITALS — BP 134/90 | HR 70 | Temp 98.2°F | Ht 63.0 in | Wt 175.0 lb

## 2019-11-02 DIAGNOSIS — Z634 Disappearance and death of family member: Secondary | ICD-10-CM

## 2019-11-02 DIAGNOSIS — Z794 Long term (current) use of insulin: Secondary | ICD-10-CM

## 2019-11-02 DIAGNOSIS — E1165 Type 2 diabetes mellitus with hyperglycemia: Secondary | ICD-10-CM

## 2019-11-02 DIAGNOSIS — F329 Major depressive disorder, single episode, unspecified: Secondary | ICD-10-CM

## 2019-11-02 DIAGNOSIS — F32A Depression, unspecified: Secondary | ICD-10-CM

## 2019-11-02 DIAGNOSIS — E876 Hypokalemia: Secondary | ICD-10-CM

## 2019-11-02 LAB — GLUCOSE, POCT (MANUAL RESULT ENTRY): POC Glucose: 115 mg/dl — AB (ref 70–99)

## 2019-11-02 MED ORDER — DULOXETINE HCL 60 MG PO CPEP: 60.0000 mg | ORAL_CAPSULE | Freq: Every day | ORAL | 3 refills | Status: DC

## 2019-11-02 MED FILL — ?DULOXETINE HCL 60 MG CPEP: 60 | 30 days supply | Qty: 30 | Fill #0

## 2019-11-02 NOTE — Progress Notes (Signed)
Subjective:  Patient ID: Whitney Benton, female    DOB: Jun 12, 1971  Age: 48 y.o. MRN: [id number]  CC: Hospitalization Follow-up   HPI Whitney Benton is a 48 year old female with a history of type 2 diabetes mellitus (A1c 14.9), hyperlipidemia, depression here for follow-up after hospitalization from 10/02/2019 through 10/04/2019 at [facility] for DKA where she had severe anion gap hyperchloremic metabolic acidosis on presentation. Metformin was discontinued and she was commenced on NovoLog for mealtime coverage. Also noted to have acute kidney injury with an elevated creatinine of 2.1 which improved to 0.7 at the time of discharge after hydration. 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  He later passed a week after.  She has been compliant with her medications now and is more involved in trying to eat right.  Blood sugars range between 120 and 138 with her highest blood sugar at 160. She finds herself very irritable and anxiety and depression have worsened ever since the passing of her dad.  She now has her mother to care for.  Currently on 30 mg of Cymbalta.  Past Medical History:  Diagnosis Date  . Anxiety   . Depression   . Trichomonas infection     Past Surgical History:  Procedure Laterality Date  . CESAREAN SECTION    . VAGINAL HYSTERECTOMY  2006   Fibroids, menorrhagia, benign pathology    Family History  Problem Relation Age of Onset  . Diabetes Mother   . Mental illness Mother   . Depression Mother   . Hypertension Mother   . Hypertension Father   . Diabetes Father     Allergies  Allergen Reactions  . Aspirin Hives  . Oxycodone Nausea And Vomiting    Outpatient Medications Prior to Visit  Medication Sig Dispense Refill  . atorvastatin (LIPITOR) 20 MG tablet Take 1 tablet (20 mg total) by mouth daily. 90 tablet 3  . Blood Glucose Monitoring  Suppl (TRUE METRIX METER) w/Device KIT Check blood sugars three times a day 1 kit 0  . glucose blood test strip Check blood sugars three times a day 100 each 12  . insulin aspart (NOVOLOG FLEXPEN) 100 UNIT/ML FlexPen Inject 5 Units into the skin 3 (three) times daily with meals. 15 mL 11  . Insulin Glargine (LANTUS SOLOSTAR) 100 UNIT/ML Solostar Pen Inject 25 Units into the skin at bedtime. 15 mL 4  . Insulin Pen Needle (PEN NEEDLES) 31G X 6 MM MISC Use as directed 100 each 0  . SUMAtriptan (IMITREX) 50 MG tablet Take 1 tab at the start of the headache.  If no improvement, may repeat dose in 2 hours.  Max 2 tabs/24-hour.. 10 tablet 0  . topiramate (TOPAMAX) 50 MG tablet Take 1 tablet (50 mg total) by mouth 2 (two) times daily. 30 tablet 2  . traZODone (DESYREL) 50 MG tablet Take 1 tablet (50 mg total) by mouth at bedtime as needed for sleep. 30 tablet 0  . TRUEPLUS LANCETS 28G MISC Check blood sugars three time a day 100 each 12  . Vitamin D, Ergocalciferol, (DRISDOL) 1.25 MG (50000 UT) CAPS capsule Take 1 capsule (50,000 Units total) by mouth every 7 (seven) days. 16 capsule 0  . DULoxetine (CYMBALTA) 20 MG capsule Take 1 capsule (20 mg total) by mouth daily. 30 capsule 3  . cetirizine (ZYRTEC) 10 MG tablet Take 1 tablet (  10 mg total) by mouth daily. (Patient not taking: Reported on 11/02/2019) 30 tablet 1  . omeprazole (PRILOSEC) 20 MG capsule Take 1 capsule (20 mg total) by mouth daily. (Patient not taking: Reported on 11/02/2019) 30 capsule 1   No facility-administered medications prior to visit.      ROS Review of Systems  Constitutional: Negative for activity change, appetite change and fatigue.  HENT: Negative for congestion, sinus pressure and sore throat.   Eyes: Negative for visual disturbance.  Respiratory: Negative for cough, chest tightness, shortness of breath and wheezing.   Cardiovascular: Negative for chest pain and palpitations.  Gastrointestinal: Negative for abdominal  distention, abdominal pain and constipation.  Endocrine: Negative for polydipsia.  Genitourinary: Negative for dysuria and frequency.  Musculoskeletal: Negative for arthralgias and back pain.  Skin: Negative for rash.  Neurological: Negative for tremors, light-headedness and numbness.  Hematological: Does not bruise/bleed easily.  Psychiatric/Behavioral: Negative for agitation and behavioral problems.       Positive for irritability    Objective:  BP 134/90   Pulse 70   Temp 98.2 F (36.8 C) (Oral)   Ht 5' 3"  (1.6 m)   Wt 175 lb (79.4 kg)   SpO2 97%   BMI 31.00 kg/m   BP/Weight 11/02/2019 10/04/2019 06/15/7627  Systolic BP 315 176 160  Diastolic BP 90 72 92  Wt. (Lbs) 175 - -  BMI 31 - -      Physical Exam Constitutional:      Appearance: She is well-developed.  Neck:     Vascular: No JVD.  Cardiovascular:     Rate and Rhythm: Normal rate.     Heart sounds: Normal heart sounds. No murmur.  Pulmonary:     Effort: Pulmonary effort is normal.     Breath sounds: Normal breath sounds. No wheezing or rales.  Chest:     Chest wall: No tenderness.  Abdominal:     General: Bowel sounds are normal. There is no distension.     Palpations: Abdomen is soft. There is no mass.     Tenderness: There is no abdominal tenderness.  Musculoskeletal: Normal range of motion.     Right lower leg: No edema.     Left lower leg: No edema.  Neurological:     Mental Status: She is alert and oriented to person, place, and time.  Psychiatric:        Mood and Affect: Mood normal.     CMP Latest Ref Rng & Units 10/04/2019 10/03/2019 10/03/2019  Glucose 70 - 99 mg/dL 288(H) 171(H) 165(H)  BUN 6 - 20 mg/dL 15 20 24(H)  Creatinine 0.44 - 1.00 mg/dL 0.77 0.81 0.89  Sodium 135 - 145 mmol/L 137 139 139  Potassium 3.5 - 5.1 mmol/L 3.3(L) 3.3(L) 3.7  Chloride 98 - 111 mmol/L 103 108 111  CO2 22 - 32 mmol/L 23 20(L) 17(L)  Calcium 8.9 - 10.3 mg/dL 8.5(L) 8.5(L) 8.7(L)  Total Protein 6.5 -  8.1 g/dL - - 6.7  Total Bilirubin 0.3 - 1.2 mg/dL - - 1.3(H)  Alkaline Phos 38 - 126 U/L - - 84  AST 15 - 41 U/L - - 9(L)  ALT 0 - 44 U/L - - 11    Lipid Panel     Component Value Date/Time   CHOL 198 02/04/2019 1445   TRIG 153 (H) 02/04/2019 1445   HDL 43 02/04/2019 1445   CHOLHDL 4.6 (H) 02/04/2019 1445   CHOLHDL 3.2 11/14/2014 0707   VLDL 19  11/14/2014 0707   LDLCALC 124 (H) 02/04/2019 1445    CBC    Component Value Date/Time   WBC 10.6 (H) 10/04/2019 0355   RBC 4.41 10/04/2019 0355   HGB 12.4 10/04/2019 0355   HGB 13.6 07/14/2019 1058   HCT 36.8 10/04/2019 0355   HCT 40.5 07/14/2019 1058   PLT 237 10/04/2019 0355   PLT 343 07/14/2019 1058   MCV 83.4 10/04/2019 0355   MCV 85 07/14/2019 1058   MCH 28.1 10/04/2019 0355   MCHC 33.7 10/04/2019 0355   RDW 13.5 10/04/2019 0355   RDW 13.1 07/14/2019 1058   LYMPHSABS 1.2 10/02/2019 1738   LYMPHSABS 2.4 07/14/2019 1058   MONOABS 0.9 10/02/2019 1738   EOSABS 0.0 10/02/2019 1738   EOSABS 0.1 07/14/2019 1058   BASOSABS 0.1 10/02/2019 1738   BASOSABS 0.0 07/14/2019 1058    Lab Results  Component Value Date   HGBA1C 14.9 (H) 10/02/2019    Assessment & Plan:   1. Type 2 diabetes mellitus with hyperglycemia, with long-term current use of insulin (HCC) Uncontrolled with A1c of 14.9 with goal of less than 7 Poor glycemic control largely due to difficulty complying with medications while being caregiver for her diet at the time Blood sugars reveal improvement at this time as she is now more compliant hence I will make no regimen changes Counseled on Diabetic diet, my plate method, 970 minutes of moderate intensity exercise/week Blood sugar logs with fasting goals of 80-120 mg/dl, random of less than 180 and in the event of sugars less than 60 mg/dl or greater than 400 mg/dl encouraged to notify the clinic. Advised on the need for annual eye exams, annual foot exams, Pneumonia vaccine. - Glucose (CBG)  2. Depression,  unspecified depression type Worsened by recent bereavement Increase Cymbalta dose - DULoxetine (CYMBALTA) 60 MG capsule; Take 1 capsule (60 mg total) by mouth daily.  Dispense: 30 capsule; Refill: 3  3. Bereavement Discussed the need for grief counseling and she will be looking into this  4. Hypokalemia Potassium at discharge was 3.3 We will recheck again - Basic Metabolic Panel   Meds ordered this encounter  Medications  . DULoxetine (CYMBALTA) 60 MG capsule    Sig: Take 1 capsule (60 mg total) by mouth daily.    Dispense:  30 capsule    Refill:  3    Follow-up: Return in about 3 months (around 02/02/2020) for Chronic medical conditions with PCP.       Charlott Rakes, MD, FAAFP. [facility] [place], [place]   11/02/2019, 9:54 AM

## 2019-11-02 NOTE — Patient Instructions (Signed)
Managing Loss, Adult People experience loss in many different ways throughout their lives. Events such as moving, changing jobs, and losing friends can create a sense of loss. The loss may be as serious as a major health change, divorce, death of a pet, or death of a loved one. All of these types of loss are likely to create a physical and emotional reaction known as grief. Grief is the result of a major change or an absence of something or someone that you count on. Grief is a normal reaction to loss. A variety of factors can affect your grieving experience, including:  The nature of your loss.  Your relationship to what or whom you lost.  Your understanding of grief and how to manage it.  Your support system. How to manage lifestyle changes Keep to your normal routine as much as possible.  If you have trouble focusing or doing normal activities, it is acceptable to take some time away from your normal routine.  Spend time with friends and loved ones.  Eat a healthy diet, get plenty of sleep, and rest when you feel tired. How to recognize changes  The way that you deal with your grief will affect your ability to function as you normally do. When grieving, you may experience these changes:  Numbness, shock, sadness, anxiety, anger, denial, and guilt.  Thoughts about death.  Unexpected crying.  A physical sensation of emptiness in your stomach.  Problems sleeping and eating.  Tiredness (fatigue).  Loss of interest in normal activities.  Dreaming about or imagining seeing the person who died.  A need to remember what or whom you lost.  Difficulty thinking about anything other than your loss for a period of time.  Relief. If you have been expecting the loss for a while, you may feel a sense of relief when it happens. Follow these instructions at home:  Activity Express your feelings in healthy ways, such as:  Talking with others about your loss. It may be helpful to find  others who have had a similar loss, such as a support group.  Writing down your feelings in a journal.  Doing physical activities to release stress and emotional energy.  Doing creative activities like painting, sculpting, or playing or listening to music.  Practicing resilience. This is the ability to recover and adjust after facing challenges. Reading some resources that encourage resilience may help you to learn ways to practice those behaviors. General instructions  Be patient with yourself and others. Allow the grieving process to happen, and remember that grieving takes time. ? It is likely that you may never feel completely done with some grief. You may find a way to move on while still cherishing memories and feelings about your loss. ? Accepting your loss is a process. It can take months or longer to adjust.  Keep all follow-up visits as told by your health care provider. This is important. Where to find support To get support for managing loss:  Ask your health care provider for help and recommendations, such as grief counseling or therapy.  Think about joining a support group for people who are managing a loss. Where to find more information You can find more information about managing loss from:  American Society of Clinical Oncology: www.cancer.net  American Psychological Association: www.apa.org Contact a health care provider if:  Your grief is extreme and keeps getting worse.  You have ongoing grief that does not improve.  Your body shows symptoms of grief, such   as illness.  You feel depressed, anxious, or lonely. Get help right away if:  You have thoughts about hurting yourself or others. If you ever feel like you may hurt yourself or others, or have thoughts about taking your own life, get help right away. You can go to your nearest emergency department or call:  Your local emergency services (911 in the [place]  A suicide crisis helpline, such as the  National Suicide Prevention Lifeline at 1-800-273-8255. This is open 24 hours a day. Summary  Grief is the result of a major change or an absence of someone or something that you count on. Grief is a normal reaction to loss.  The depth of grief and the period of recovery depend on the type of loss and your ability to adjust to the change and process your feelings.  Processing grief requires patience and a willingness to accept your feelings and talk about your loss with people who are supportive.  It is important to find resources that work for you and to realize that people experience grief differently. There is not one grieving process that works for everyone in the same way.  Be aware that when grief becomes extreme, it can lead to more severe issues like isolation, depression, anxiety, or suicidal thoughts. Talk with your health care provider if you have any of these issues. This information is not intended to replace advice given to you by your health care provider. Make sure you discuss any questions you have with your health care provider. Document Released: 04/24/2017 Document Revised: 02/12/2019 Document Reviewed: 04/24/2017 Elsevier Patient Education  2020 [employer].  

## 2019-11-03 LAB — BASIC METABOLIC PANEL
BUN/Creatinine Ratio: 8 — ABNORMAL LOW (ref 9–23)
BUN: 6 mg/dL (ref 6–24)
CO2: 27 mmol/L (ref 20–29)
Calcium: 9.8 mg/dL (ref 8.7–10.2)
Chloride: 104 mmol/L (ref 96–106)
Creatinine, Ser: 0.76 mg/dL (ref 0.57–1.00)
GFR calc Af Amer: 107 mL/min/{1.73_m2} (ref >59)
GFR calc non Af Amer: 92 mL/min/{1.73_m2} (ref >59)
Glucose: 86 mg/dL (ref 65–99)
Potassium: 4.2 mmol/L (ref 3.5–5.2)
Sodium: 144 mmol/L (ref 134–144)

## 2019-11-09 ENCOUNTER — Other Ambulatory Visit: Payer: Self-pay | Admitting: Internal Medicine

## 2019-11-09 DIAGNOSIS — G43519 Persistent migraine aura without cerebral infarction, intractable, without status migrainosus: Secondary | ICD-10-CM

## 2019-11-09 MED FILL — SUMATRIPTAN SUCC 50 MG TAB: 50 | 30 days supply | Qty: 9 | Fill #0

## 2019-11-10 ENCOUNTER — Telehealth: Payer: Self-pay | Admitting: Internal Medicine

## 2019-11-10 NOTE — Telephone Encounter (Signed)
1) Medication(s) Requested (by name): -cyclobenzaprine (FLEXERIL) 10 MG tablet   2) Pharmacy of Choice: -[facility]  3) Special Requests: -Pt has not received this medication since 2018, LVM that she needed an appointment for any refills.

## 2019-11-11 MED FILL — !LANTUS SOLOSTAR 100UNITS/M: 100 | 24 days supply | Qty: 6 | Fill #1

## 2019-11-11 MED FILL — NOVOLOG FLEXPEN SYRINGE: 100 | 20 days supply | Qty: 3 | Fill #2

## 2019-11-16 ENCOUNTER — Other Ambulatory Visit: Payer: Self-pay

## 2019-11-16 ENCOUNTER — Ambulatory Visit: Payer: Self-pay | Attending: Internal Medicine | Admitting: Physician Assistant

## 2019-11-16 DIAGNOSIS — G43519 Persistent migraine aura without cerebral infarction, intractable, without status migrainosus: Secondary | ICD-10-CM

## 2019-11-16 DIAGNOSIS — Z794 Long term (current) use of insulin: Secondary | ICD-10-CM

## 2019-11-16 DIAGNOSIS — M62838 Other muscle spasm: Secondary | ICD-10-CM

## 2019-11-16 DIAGNOSIS — E1165 Type 2 diabetes mellitus with hyperglycemia: Secondary | ICD-10-CM

## 2019-11-16 MED ORDER — TOPIRAMATE 50 MG PO TABS: 50.0000 mg | ORAL_TABLET | Freq: Two times a day (BID) | ORAL | 3 refills | Status: DC

## 2019-11-16 MED ORDER — CYCLOBENZAPRINE HCL 10 MG PO TABS: 10.0000 mg | ORAL_TABLET | Freq: Every day | ORAL | 1 refills | Status: DC

## 2019-11-16 MED ORDER — SUMATRIPTAN SUCCINATE 50 MG PO TABS: ORAL_TABLET | ORAL | 2 refills | Status: DC

## 2019-11-16 MED FILL — TOPIRAMATE 50 MG TABLET: 50 | 30 days supply | Qty: 30 | Fill #0

## 2019-11-16 MED FILL — CYCLOBENZAPRINE 10 MG TAB: 10 | 30 days supply | Qty: 30 | Fill #0

## 2019-11-16 NOTE — Progress Notes (Signed)
Patient verified DOB Patient has taken medication today. Patient has eaten today. Patient complains of HA beginning at discharge from [facility]. Patient describes it as a constant HA daily. Patient complains of stiffness in her neck when may be causing the pain. Patient request muscle relaxer. CBG today before eating 159

## 2019-11-16 NOTE — Progress Notes (Signed)
Patient ID: Whitney Benton, female   DOB: [birth year], 48 y.o.   MRN: [id number] Virtual Visit via Telephone Note  I connected with Whitney Benton on 11/16/19 at 10:50 AM EST by telephone and verified that I am speaking with the correct person using two identifiers.   I discussed the limitations, risks, security and privacy concerns of performing an evaluation and management service by telephone and the availability of in person appointments. I also discussed with the patient that there may be a patient responsible charge related to this service. The patient expressed understanding and agreed to proceed.  Patient location:  home My Location:  [place] office Persons on the call:  Me and the patient  History of Present Illness:  Patient is out of topamax and imitrex and HA have returned.  HA are same as previously.  No vision changes.  No f/c. Her dad died recently which she believes has also prompted more HA and more muscle spasm.  She is also requesting flexeril to help with sleep and muscle spasm.   Blood sugars running 90-200 depending on what she eats.  She is trying to eat less sugar but doesn't always comply with diabetic diet.     Observations/Objective:  NAD.  A&Ox3   Assessment and Plan: 1. Migraine aura, persistent, intractable - topiramate (TOPAMAX) 50 MG tablet; Take 1 tablet (50 mg total) by mouth 2 (two) times daily.  Dispense: 30 tablet; Refill: 3 - SUMAtriptan (IMITREX) 50 MG tablet; May repeat in 2 hours if headache persists or recurs.  Dispense: 9 tablet; Refill: 2  2. Muscle spasm - cyclobenzaprine (FLEXERIL) 10 MG tablet; Take 1 tablet (10 mg total) by mouth at bedtime.  Dispense: 30 tablet; Refill: 1  3.  Type 2 DM-work on diabetic diet.  Continue current medication regimen  Follow Up Instructions: Keep f/up in February.   I discussed the assessment and treatment plan with the patient. The patient was provided an opportunity to ask questions and all were answered. The  patient agreed with the plan and demonstrated an understanding of the instructions.   The patient was advised to call back or seek an in-person evaluation if the symptoms worsen or if the condition fails to improve as anticipated.  I provided 14 minutes of non-face-to-face time during this encounter.   Freeman Caldron, PA-C

## 2019-11-24 MED FILL — ?ATORVASTATIN 20 MG TABLET: 20 | 30 days supply | Qty: 30 | Fill #6

## 2019-11-24 MED FILL — VIT D2 1.25 MG (50,000 UNIT: 1.25 MG | 28 days supply | Qty: 4 | Fill #2

## 2019-11-24 MED FILL — OMEPRAZOLE 20 MG CAP: 20 | 30 days supply | Qty: 30 | Fill #1

## 2019-11-30 MED FILL — ?DULOXETINE HCL 60 MG CPEP: 60 | 30 days supply | Qty: 30 | Fill #1

## 2019-11-30 MED FILL — SUMATRIPTAN SUCC 50 MG TAB: 50 | 30 days supply | Qty: 9 | Fill #1

## 2019-12-01 ENCOUNTER — Other Ambulatory Visit: Payer: Self-pay | Admitting: Internal Medicine

## 2019-12-01 MED FILL — ?traZODONE HCL 50MG TAB: 50 | 30 days supply | Qty: 30 | Fill #0

## 2019-12-10 MED FILL — TOPIRAMATE 50 MG TABLET: 50 | 30 days supply | Qty: 30 | Fill #1

## 2019-12-10 MED FILL — !NOVOLOG FLEXPEN SYRINGE 1: 100/ML | 20 days supply | Qty: 3 | Fill #3

## 2019-12-10 MED FILL — !LANTUS SOLOSTAR 100UNITS/M: 100 | 24 days supply | Qty: 6 | Fill #2

## 2019-12-20 ENCOUNTER — Other Ambulatory Visit: Payer: Self-pay | Admitting: Internal Medicine

## 2019-12-20 MED FILL — ?ERGOCALCIFEROL 50000 UNITC: 1.25 MG | 28 days supply | Qty: 4 | Fill #3

## 2019-12-20 MED FILL — ?OMEPRAZOLE 20MG CAP DR: 20 | 30 days supply | Qty: 30 | Fill #0

## 2019-12-20 MED FILL — ?ATORVASTATIN 20 MG TABLET: 20 | 30 days supply | Qty: 30 | Fill #7

## 2019-12-27 MED FILL — ?DULOXETINE HCL 60 MG CPEP: 60 | 30 days supply | Qty: 30 | Fill #2

## 2019-12-27 MED FILL — CYCLOBENZAPRINE 10 MG TAB: 10 | 30 days supply | Qty: 30 | Fill #1

## 2020-01-06 ENCOUNTER — Ambulatory Visit (HOSPITAL_COMMUNITY)
Admission: EM | Admit: 2020-01-06 | Discharge: 2020-01-06 | Disposition: A | Discharge status: Home / Self Care | Payer: Self-pay | Attending: Family Medicine | Admitting: Family Medicine

## 2020-01-06 ENCOUNTER — Ambulatory Visit (INDEPENDENT_AMBULATORY_CARE_PROVIDER_SITE_OTHER): Payer: Self-pay

## 2020-01-06 ENCOUNTER — Encounter (HOSPITAL_COMMUNITY): Payer: Self-pay

## 2020-01-06 ENCOUNTER — Other Ambulatory Visit: Payer: Self-pay

## 2020-01-06 DIAGNOSIS — M25512 Pain in left shoulder: Secondary | ICD-10-CM

## 2020-01-06 DIAGNOSIS — M19012 Primary osteoarthritis, left shoulder: Secondary | ICD-10-CM

## 2020-01-06 MED ORDER — KETOROLAC TROMETHAMINE 30 MG/ML IJ SOLN
30.0000 mg | Freq: Once | INTRAMUSCULAR | Status: AC
  Administered 2020-01-06: 30 mg via INTRAMUSCULAR

## 2020-01-06 MED ORDER — MELOXICAM 7.5 MG PO TABS: 15.0000 mg | ORAL_TABLET | Freq: Every day | ORAL | 0 refills | Status: DC

## 2020-01-06 MED ORDER — KETOROLAC TROMETHAMINE 30 MG/ML IJ SOLN
INTRAMUSCULAR | Status: AC
  Filled 2020-01-06: qty 1

## 2020-01-06 MED FILL — MELOXICAM 7.5 MG TABLET: 7.5 | 15 days supply | Qty: 30 | Fill #0

## 2020-01-06 NOTE — ED Provider Notes (Signed)
[place]    CSN: [id number] Arrival date & time: 01/06/20  9892      History   Chief Complaint Chief Complaint  Patient presents with  . Shoulder Pain    HPI Whitney Benton is a 49 y.o. female.   Patient is a 49 year old female past medical history of anxiety, depression, DKA, AKI, diabetes.  Patient reporting approximately 3 days of left shoulder pain worsening since yesterday.  She was unable to sleep last night due to the pain.  Limited range of motion.  Denies any falls or injuries.  Denies any chest pain, shortness of breath, numbness or tingling.  No weakness.  ROS per HPI    Shoulder Pain   Past Medical History:  Diagnosis Date  . Anxiety   . Depression   . Trichomonas infection     Patient Active Problem List   Diagnosis Date Noted  . DKA (diabetic ketoacidoses) ([place]) 10/02/2019  . AKI (acute kidney injury) ([place]) 10/02/2019  . Hyperkalemia 10/02/2019  . Leukocytosis 10/02/2019  . Abnormal LFTs 10/02/2019  . Stressful life events affecting family and household 08/06/2019  . New onset type 2 diabetes mellitus ([place]) 02/04/2019  . Obesity (BMI 30-39.9) 02/04/2019  . Tobacco dependence 02/04/2019  . Left arm weakness 12/06/2014  . Paresthesias/numbness 12/06/2014  . Tobacco abuse 11/14/2014  . Depression   . Mixed incontinence 06/27/2014  . History of TVH in 2006 for fibroids and menorrhagia; benign pathology 09/08/2011    Past Surgical History:  Procedure Laterality Date  . CESAREAN SECTION    . VAGINAL HYSTERECTOMY  2006   Fibroids, menorrhagia, benign pathology    OB History    Gravida  5   Para  3   Term  3   Preterm      AB  2   Living  3     SAB  2   TAB  0   Ectopic  0   Multiple  0   Live Births               Home Medications    Prior to Admission medications   Medication Sig Start Date End Date Taking? Authorizing Provider  atorvastatin (LIPITOR) 20 MG tablet Take 1 tablet (20 mg total) by mouth  daily. 02/05/19   Ladell Pier, MD  Blood Glucose Monitoring Suppl (TRUE METRIX METER) w/Device KIT Check blood sugars three times a day 02/04/19   Ladell Pier, MD  cetirizine (ZYRTEC) 10 MG tablet Take 1 tablet (10 mg total) by mouth daily. Patient not taking: Reported on 11/02/2019 03/31/19   Charlott Rakes, MD  cyclobenzaprine (FLEXERIL) 10 MG tablet Take 1 tablet (10 mg total) by mouth at bedtime. 11/16/19   Argentina Donovan, PA-C  DULoxetine (CYMBALTA) 60 MG capsule Take 1 capsule (60 mg total) by mouth daily. 11/02/19   Charlott Rakes, MD  ergocalciferol (VITAMIN D2) 1.25 MG (50000 UT) capsule TAKE 1 CAPSULE (50,000 UNITS TOTAL) BY MOUTH EVERY 7 (SEVEN) DAYS. 01/07/20   Ladell Pier, MD  glucose blood test strip Check blood sugars three times a day 02/04/19   Ladell Pier, MD  insulin aspart (NOVOLOG FLEXPEN) 100 UNIT/ML FlexPen Inject 5 Units into the skin 3 (three) times daily with meals. 10/04/19   Rai, Vernelle Emerald, MD  Insulin Glargine (LANTUS SOLOSTAR) 100 UNIT/ML Solostar Pen Inject 25 Units into the skin at bedtime. 10/04/19   Rai, Vernelle Emerald, MD  Insulin Pen Needle (PEN NEEDLES) 31G  X 6 MM MISC Use as directed 02/26/19   Ladell Pier, MD  meloxicam (MOBIC) 7.5 MG tablet Take 2 tablets (15 mg total) by mouth daily. 01/06/20   Latrisha Coiro, Tressia Miners A, NP  omeprazole (PRILOSEC) 20 MG capsule TAKE 1 CAPSULE (20 MG TOTAL) BY MOUTH DAILY. 12/20/19   Ladell Pier, MD  SUMAtriptan (IMITREX) 50 MG tablet May repeat in 2 hours if headache persists or recurs. 11/16/19   Argentina Donovan, PA-C  topiramate (TOPAMAX) 50 MG tablet Take 1 tablet (50 mg total) by mouth 2 (two) times daily. 11/16/19   Argentina Donovan, PA-C  traZODone (DESYREL) 50 MG tablet TAKE 1 TABLET (50 MG TOTAL) BY MOUTH AT BEDTIME AS NEEDED FOR SLEEP. 12/01/19   Ladell Pier, MD  TRUEPLUS LANCETS 28G MISC Check blood sugars three time a day 02/04/19   Ladell Pier, MD    Family History Family  History  Problem Relation Age of Onset  . Diabetes Mother   . Mental illness Mother   . Depression Mother   . Hypertension Mother   . Hypertension Father   . Diabetes Father     Social History Social History   Tobacco Use  . Smoking status: Current Every Day Smoker    Packs/day: 0.25    Years: 8.00    Pack years: 2.00    Types: Cigarettes  . Smokeless tobacco: Never Used  Substance Use Topics  . Alcohol use: Yes    Comment: occasional  . Drug use: No     Allergies   Aspirin and Oxycodone   Review of Systems Review of Systems   Physical Exam Triage Vital Signs ED Triage Vitals  Enc Vitals Group     BP 01/06/20 0848 132/79     Pulse Rate 01/06/20 0848 (!) 101     Resp 01/06/20 0848 16     Temp 01/06/20 0848 98.3 F (36.8 C)     Temp Source 01/06/20 0848 Oral     SpO2 01/06/20 0848 96 %     Weight 01/06/20 0845 181 lb 3.2 oz (82.2 kg)     Height --      Head Circumference --      Peak Flow --      Pain Score 01/06/20 0845 10     Pain Loc --      Pain Edu? --      Excl. in [place]? --    No data found.  Updated Vital Signs BP 132/79 (BP Location: Left Arm)   Pulse (!) 101   Temp 98.3 F (36.8 C) (Oral)   Resp 16   Wt 181 lb 3.2 oz (82.2 kg)   SpO2 96%   BMI 32.10 kg/m   Visual Acuity Right Eye Distance:   Left Eye Distance:   Bilateral Distance:    Right Eye Near:   Left Eye Near:    Bilateral Near:     Physical Exam Vitals and nursing note reviewed.  Constitutional:      General: She is not in acute distress.    Appearance: Normal appearance. She is not ill-appearing, toxic-appearing or diaphoretic.  HENT:     Head: Normocephalic.     Nose: Nose normal.     Mouth/Throat:     Pharynx: Oropharynx is clear.  Eyes:     Conjunctiva/sclera: Conjunctivae normal.  Pulmonary:     Effort: Pulmonary effort is normal.  Musculoskeletal:     Left shoulder: Tenderness and bony tenderness present.  No swelling or deformity. Decreased range of motion.  Decreased strength. Normal pulse.     Cervical back: Normal range of motion.  Skin:    General: Skin is warm and dry.     Findings: No rash.  Neurological:     Mental Status: She is alert.  Psychiatric:        Mood and Affect: Mood normal.      UC Treatments / Results  Labs (all labs ordered are listed, but only abnormal results are displayed) Labs Reviewed - No data to display  EKG   Radiology No results found.  Procedures Procedures (including critical care time)  Medications Ordered in UC Medications  ketorolac (TORADOL) 30 MG/ML injection 30 mg (30 mg Intramuscular Given 01/06/20 0955)    Initial Impression / Assessment and Plan / UC Course  I have reviewed the triage vital signs and the nursing notes.  Pertinent labs & imaging results that were available during my care of the patient were reviewed by me and considered in my medical decision making (see chart for details).  Clinical Course as of Jan 08 934  Thu Jan 06, 2020  0933 DG Shoulder Left [TB]    Clinical Course User Index [TB] Loura Halt A, NP    OA of the left shoulder Treating with meloxicam Toradol given in clinic for pain RICE  Follow up as needed for continued or worsening symptoms   Final Clinical Impressions(s) / UC Diagnoses   Final diagnoses:  Acute pain of left shoulder     Discharge Instructions     Your pain is due to some osteoarthritis in the shoulder. Toradol given here for pain I am sending some meloxicam  cam to the pharmacy to take daily for pain Rest the shoulder, ice  If your symptoms continue or worsen you need to follow-up with orthopedic.    ED Prescriptions    Medication Sig Dispense Auth. Provider   meloxicam (MOBIC) 7.5 MG tablet Take 2 tablets (15 mg total) by mouth daily. 30 tablet Robynn Marcel A, NP     I have reviewed the PDMP during this encounter.   Orvan July, NP 01/09/20 [id number]

## 2020-01-06 NOTE — ED Triage Notes (Signed)
Pt. States yesterday was the start of her left shoulder pain & states she can barely move it without it being painful.

## 2020-01-06 NOTE — Discharge Instructions (Addendum)
Your pain is due to some osteoarthritis in the shoulder. Toradol given here for pain I am sending some meloxicam  cam to the pharmacy to take daily for pain Rest the shoulder, ice  If your symptoms continue or worsen you need to follow-up with orthopedic.

## 2020-01-07 ENCOUNTER — Other Ambulatory Visit: Payer: Self-pay | Admitting: Physician Assistant

## 2020-01-07 MED FILL — TOPIRAMATE 50 MG TABLET: 50 | 15 days supply | Qty: 30 | Fill #2

## 2020-01-07 MED FILL — ?ERGOCALCIFEROL 50000 UNITC: 1.25 MG | 28 days supply | Qty: 4 | Fill #0

## 2020-01-18 MED FILL — ?OMEPRAZOLE 20MG CAP DR: 20 | 30 days supply | Qty: 30 | Fill #1

## 2020-01-18 MED FILL — !NOVOLOG FLEXPEN SYRINGE 1: 100/ML | 20 days supply | Qty: 3 | Fill #4

## 2020-01-18 MED FILL — ?ATORVASTATIN 20 MG TABLET: 20 | 30 days supply | Qty: 30 | Fill #8

## 2020-01-18 MED FILL — ?BASAGLAR 100 UNITS/ML KWPE: 100 | 24 days supply | Qty: 6 | Fill #3

## 2020-01-28 MED FILL — DULoxetine HCL 60 MG CPEP: 60 | 30 days supply | Qty: 30 | Fill #3

## 2020-02-04 ENCOUNTER — Ambulatory Visit: Payer: Self-pay | Attending: Internal Medicine | Admitting: Internal Medicine

## 2020-02-04 ENCOUNTER — Encounter: Payer: Self-pay | Admitting: Internal Medicine

## 2020-02-04 ENCOUNTER — Other Ambulatory Visit: Payer: Self-pay

## 2020-02-04 VITALS — BP 107/75 | HR 89 | Ht 63.0 in | Wt 178.6 lb

## 2020-02-04 DIAGNOSIS — E1169 Type 2 diabetes mellitus with other specified complication: Secondary | ICD-10-CM

## 2020-02-04 DIAGNOSIS — Z794 Long term (current) use of insulin: Secondary | ICD-10-CM

## 2020-02-04 DIAGNOSIS — E785 Hyperlipidemia, unspecified: Secondary | ICD-10-CM

## 2020-02-04 DIAGNOSIS — E1165 Type 2 diabetes mellitus with hyperglycemia: Secondary | ICD-10-CM

## 2020-02-04 DIAGNOSIS — F17200 Nicotine dependence, unspecified, uncomplicated: Secondary | ICD-10-CM

## 2020-02-04 LAB — GLUCOSE, POCT (MANUAL RESULT ENTRY)
POC Glucose: 468 mg/dl — AB (ref 70–99)
POC Glucose: 337 mg/dl — AB (ref 70–99)

## 2020-02-04 LAB — POCT URINALYSIS DIP (CLINITEK)
Bilirubin, UA: NEGATIVE
Blood, UA: NEGATIVE
Glucose, UA: 500 mg/dL — AB
Leukocytes, UA: NEGATIVE
Nitrite, UA: NEGATIVE
POC PROTEIN,UA: NEGATIVE
Spec Grav, UA: 1.02 (ref 1.010–1.025)
Urobilinogen, UA: 0.2 E.U./dL
pH, UA: 7 (ref 5.0–8.0)

## 2020-02-04 LAB — POCT GLYCOSYLATED HEMOGLOBIN (HGB A1C): HbA1c, POC (controlled diabetic range): 12.4 % — AB (ref 0.0–7.0)

## 2020-02-04 MED ORDER — LANTUS SOLOSTAR 100 UNIT/ML ~~LOC~~ SOPN: 32.0000 [IU] | PEN_INJECTOR | Freq: Every day | SUBCUTANEOUS | 4 refills | Status: DC

## 2020-02-04 MED ORDER — INSULIN ASPART 100 UNIT/ML ~~LOC~~ SOLN
10.0000 [IU] | Freq: Once | SUBCUTANEOUS | Status: AC
  Administered 2020-02-04: 10 [IU] via SUBCUTANEOUS

## 2020-02-04 MED ORDER — NOVOLOG FLEXPEN 100 UNIT/ML ~~LOC~~ SOPN: 8.0000 [IU] | PEN_INJECTOR | Freq: Three times a day (TID) | SUBCUTANEOUS | 11 refills | Status: DC

## 2020-02-04 MED FILL — !NOVOLOG FLEXPEN SYRINGE 1: 100/ML | 25 days supply | Qty: 6 | Fill #0

## 2020-02-04 MED FILL — ?BASAGLAR 100 UNITS/ML KWPE: 100 | 27 days supply | Qty: 9 | Fill #0

## 2020-02-04 NOTE — Progress Notes (Signed)
Abdominal pain sometimes she has nausea after eating.   Abdominal bloating.

## 2020-02-04 NOTE — Progress Notes (Signed)
Patient ID: ABIR EROH, female    DOB: 1971-02-28  MRN: [id number]  CC: Diabetes   Subjective: Whitney Benton is a 49 y.o. female who presents for chronic ds management Her concerns today include:  Pt with hx of tob dep, depression, migraines, mixed incontinence, DM, obesity, HL, vit D def  Since last visit with me, pt was hosp with DKA and acute kidney injury in 09/2019.  Metformin was discontinued.  She was left on Lantus and NovoLog for mealtime coverage.  DIABETES TYPE 2 Last A1C:   Results for orders placed or performed in visit on 02/04/20  POCT glucose (manual entry)  Result Value Ref Range   POC Glucose 468 (A) 70 - 99 mg/dl  POCT glycosylated hemoglobin (Hb A1C)  Result Value Ref Range   Hemoglobin A1C     HbA1c POC (<> result, manual entry)     HbA1c, POC (prediabetic range)     HbA1c, POC (controlled diabetic range) 12.4 (A) 0.0 - 7.0 %  POCT URINALYSIS DIP (CLINITEK)  Result Value Ref Range   Color, UA yellow yellow   Clarity, UA clear clear   Glucose, UA =500 (A) negative mg/dL   Bilirubin, UA negative negative   Ketones, POC UA trace (5) (A) negative mg/dL   Spec Grav, UA 1.020 1.010 - 1.025   Blood, UA negative negative   pH, UA 7.0 5.0 - 8.0   POC PROTEIN,UA negative negative, trace   Urobilinogen, UA 0.2 0.2 or 1.0 E.U./dL   Nitrite, UA Negative Negative   Leukocytes, UA Negative Negative  POCT glucose (manual entry)  Result Value Ref Range   POC Glucose 337 (A) 70 - 99 mg/dl   A1C 12.4 Med Adherence:  _0  Yes - reports compliance with Lantus 25 units and Novolog 5 units  Medication side effects:  _1  Yes    _2  No Home Monitoring?  _3  Yes    _4  No - not in past 2 wks.  Busy trying to help her mom.  Blood sugar this morning is in the 400 and she has not eaten as yet for the morning. Home glucose results range: Diet Adherence: _5  No - "I think Crystal-lite is messing me up and I have to stay away from my cookies."  Exercise: _6  Yes    _7   No Hypoglycemic episodes?: _8  Yes    _9  No Numbness of the feet? _10  Yes    _11  No Retinopathy hx? _12  Yes    _13  No Last eye exam: no blurred vision.  Due for eye exam.  Dr. Schuyler Amor last time. Comments: no polyuria/dipsia  HL:  Taking Lipitor as prescribed  Tob dep:  Smokes 1/3 a pk/day.  Wants to quit but not ready to give up completely yet Patient Active Problem List   Diagnosis Date Noted  . DKA (diabetic ketoacidoses) ([place]) 10/02/2019  . AKI (acute kidney injury) ([place]) 10/02/2019  . Hyperkalemia 10/02/2019  . Leukocytosis 10/02/2019  . Abnormal LFTs 10/02/2019  . Stressful life events affecting family and household 08/06/2019  . New onset type 2 diabetes mellitus ([place]) 02/04/2019  . Obesity (BMI 30-39.9) 02/04/2019  . Tobacco dependence 02/04/2019  . Left arm weakness 12/06/2014  . Paresthesias/numbness 12/06/2014  . Tobacco abuse 11/14/2014  . Depression   . Mixed incontinence 06/27/2014  . History of TVH in 2006 for fibroids and menorrhagia; benign pathology 09/08/2011     Current Outpatient Medications on File Prior to Visit  Medication Sig Dispense Refill  .  atorvastatin (LIPITOR) 20 MG tablet Take 1 tablet (20 mg total) by mouth daily. 90 tablet 3  . Blood Glucose Monitoring Suppl (TRUE METRIX METER) w/Device KIT Check blood sugars three times a day 1 kit 0  . cyclobenzaprine (FLEXERIL) 10 MG tablet Take 1 tablet (10 mg total) by mouth at bedtime. 30 tablet 1  . DULoxetine (CYMBALTA) 60 MG capsule Take 1 capsule (60 mg total) by mouth daily. 30 capsule 3  . ergocalciferol (VITAMIN D2) 1.25 MG (50000 UT) capsule TAKE 1 CAPSULE (50,000 UNITS TOTAL) BY MOUTH EVERY 7 (SEVEN) DAYS. 4 capsule 0  . glucose blood test strip Check blood sugars three times a day 100 each 12  . Insulin Pen Needle (PEN NEEDLES) 31G X 6 MM MISC Use as directed 100 each 0  . meloxicam (MOBIC) 7.5 MG tablet Take 2 tablets (15 mg total) by mouth daily. 30 tablet 0  . omeprazole (PRILOSEC) 20 MG capsule  TAKE 1 CAPSULE (20 MG TOTAL) BY MOUTH DAILY. 30 capsule 1  . SUMAtriptan (IMITREX) 50 MG tablet May repeat in 2 hours if headache persists or recurs. 9 tablet 2  . topiramate (TOPAMAX) 50 MG tablet Take 1 tablet (50 mg total) by mouth 2 (two) times daily. 30 tablet 3  . traZODone (DESYREL) 50 MG tablet TAKE 1 TABLET (50 MG TOTAL) BY MOUTH AT BEDTIME AS NEEDED FOR SLEEP. 30 tablet 2  . TRUEPLUS LANCETS 28G MISC Check blood sugars three time a day 100 each 12  . cetirizine (ZYRTEC) 10 MG tablet Take 1 tablet (10 mg total) by mouth daily. (Patient not taking: Reported on 11/02/2019) 30 tablet 1   No current facility-administered medications on file prior to visit.    Allergies  Allergen Reactions  . Aspirin Hives  . Oxycodone Nausea And Vomiting    Social History   Socioeconomic History  . Marital status: Single    Spouse name: Not on file  . Number of children: 3  . Years of education: 19  . Highest education level: Not on file  Occupational History  . Not on file  Tobacco Use  . Smoking status: Current Every Day Smoker    Packs/day: 0.25    Years: 8.00    Pack years: 2.00    Types: Cigarettes  . Smokeless tobacco: Never Used  Substance and Sexual Activity  . Alcohol use: Yes    Comment: occasional  . Drug use: No  . Sexual activity: Yes  Other Topics Concern  . Not on file  Social History Narrative   Patient lives at home with mother and father    Patient has 3 children    Patient is single   Patient has 14 years of education    Patient is right handed    Social Determinants of Health   Financial Resource Strain:   . Difficulty of Paying Living Expenses: Not on file  Food Insecurity:   . Worried About Charity fundraiser in the Last Year: Not on file  . Ran Out of Food in the Last Year: Not on file  Transportation Needs:   . Lack of Transportation (Medical): Not on file  . Lack of Transportation (Non-Medical): Not on file  Physical Activity:   . Days of  Exercise per Week: Not on file  . Minutes of Exercise per Session: Not on file  Stress:   . Feeling of Stress : Not on file  Social Connections:   . Frequency of Communication with Friends and  Family: Not on file  . Frequency of Social Gatherings with Friends and Family: Not on file  . Attends Religious Services: Not on file  . Active Member of Clubs or Organizations: Not on file  . Attends Archivist Meetings: Not on file  . Marital Status: Not on file  Intimate Partner Violence:   . Fear of Current or Ex-Partner: Not on file  . Emotionally Abused: Not on file  . Physically Abused: Not on file  . Sexually Abused: Not on file    Family History  Problem Relation Age of Onset  . Diabetes Mother   . Mental illness Mother   . Depression Mother   . Hypertension Mother   . Hypertension Father   . Diabetes Father     Past Surgical History:  Procedure Laterality Date  . CESAREAN SECTION    . VAGINAL HYSTERECTOMY  2006   Fibroids, menorrhagia, benign pathology    ROS: Review of Systems Negative except as stated above  PHYSICAL EXAM: BP 107/75   Pulse 89   Ht _0  (1.6 m)   Wt 178 lb 9.6 oz (81 kg)   SpO2 95%   BMI 31.64 kg/m   Wt Readings from Last 3 Encounters:  02/04/20 178 lb 9.6 oz (81 kg)  01/06/20 181 lb 3.2 oz (82.2 kg)  11/02/19 175 lb (79.4 kg)    Physical Exam  General appearance - alert, well appearing, and in no distress Mental status - normal mood, behavior, speech, dress, motor activity, and thought processes Mouth - mucous membranes moist, pharynx normal without lesions Neck - supple, no significant adenopathy Chest - clear to auscultation, no wheezes, rales or rhonchi, symmetric air entry Heart - normal rate, regular rhythm, normal S1, S2, no murmurs, rubs, clicks or gallops Extremities - peripheral pulses normal, no pedal edema, no clubbing or cyanosis Diabetic Foot Exam - Simple   Simple Foot Form Visual Inspection No deformities,  no ulcerations, no other skin breakdown bilaterally: Yes Sensation Testing Intact to touch and monofilament testing bilaterally: Yes Pulse Check Posterior Tibialis and Dorsalis pulse intact bilaterally: Yes Comments      CMP Latest Ref Rng & Units 11/02/2019 10/04/2019 10/03/2019  Glucose 65 - 99 mg/dL 86 288(H) 171(H)  BUN 6 - 24 mg/dL _1 Creatinine 0.57 - 1.00 mg/dL 0.76 0.77 0.81  Sodium 134 - 144 mmol/L 144 137 139  Potassium 3.5 - 5.2 mmol/L 4.2 3.3(L) 3.3(L)  Chloride 96 - 106 mmol/L 104 103 108  CO2 20 - 29 mmol/L 27 23 20(L)  Calcium 8.7 - 10.2 mg/dL 9.8 8.5(L) 8.5(L)  Total Protein 6.5 - 8.1 g/dL - - -  Total Bilirubin 0.3 - 1.2 mg/dL - - -  Alkaline Phos 38 - 126 U/L - - -  AST 15 - 41 U/L - - -  ALT 0 - 44 U/L - - -   Lipid Panel     Component Value Date/Time   CHOL 198 02/04/2019 1445   TRIG 153 (H) 02/04/2019 1445   HDL 43 02/04/2019 1445   CHOLHDL 4.6 (H) 02/04/2019 1445   CHOLHDL 3.2 11/14/2014 0707   VLDL 19 11/14/2014 0707   LDLCALC 124 (H) 02/04/2019 1445    CBC    Component Value Date/Time   WBC 10.6 (H) 10/04/2019 0355   RBC 4.41 10/04/2019 0355   HGB 12.4 10/04/2019 0355   HGB 13.6 07/14/2019 1058   HCT 36.8 10/04/2019 0355   HCT 40.5 07/14/2019 1058  PLT 237 10/04/2019 0355   PLT 343 07/14/2019 1058   MCV 83.4 10/04/2019 0355   MCV 85 07/14/2019 1058   MCH 28.1 10/04/2019 0355   MCHC 33.7 10/04/2019 0355   RDW 13.5 10/04/2019 0355   RDW 13.1 07/14/2019 1058   LYMPHSABS 1.2 10/02/2019 1738   LYMPHSABS 2.4 07/14/2019 1058   MONOABS 0.9 10/02/2019 1738   EOSABS 0.0 10/02/2019 1738   EOSABS 0.1 07/14/2019 1058   BASOSABS 0.1 10/02/2019 1738   BASOSABS 0.0 07/14/2019 1058    ASSESSMENT AND PLAN: 1. Type 2 diabetes mellitus with hyperglycemia, with long-term current use of insulin (HCC) Not at goal. Patient given 10 units of NovoLog today with 2 big cups of water. Dietary counseling given.  Encouraged her to snack on fruits or  nuts instead of cookies.  Encouraged to drink more water. Increase Lantus insulin to 32 units daily and NovoLog to 8 units with meals. Encouraged to check blood sugars at least twice a day.  Follow-up in 2 weeks with clinical pharmacist - Lipid panel - Basic Metabolic Panel - insulin aspart (novoLOG) injection 10 Units - insulin aspart (NOVOLOG FLEXPEN) 100 UNIT/ML FlexPen; Inject 8 Units into the skin 3 (three) times daily with meals.  Dispense: 15 mL; Refill: 11 - Insulin Glargine (LANTUS SOLOSTAR) 100 UNIT/ML Solostar Pen; Inject 32 Units into the skin at bedtime.  Dispense: 15 mL; Refill: 4 - POCT URINALYSIS DIP (CLINITEK) - POCT glucose (manual entry)  2. Tobacco dependence Advised to quit.  Discussed health risks associated with smoking.  She is not ready to give a trial of quitting  3. Hyperlipidemia associated with type 2 diabetes mellitus ([place]) Continue atorvastatin  Patient was given the opportunity to ask questions.  Patient verbalized understanding of the plan and was able to repeat key elements of the plan.   Orders Placed This Encounter  Procedures  . Microalbumin / creatinine urine ratio  . Lipid panel  . Basic Metabolic Panel  . POCT glucose (manual entry)  . POCT glycosylated hemoglobin (Hb A1C)  . POCT URINALYSIS DIP (CLINITEK)  . POCT glucose (manual entry)     Requested Prescriptions   Signed Prescriptions Disp Refills  . insulin aspart (NOVOLOG FLEXPEN) 100 UNIT/ML FlexPen 15 mL 11    Sig: Inject 8 Units into the skin 3 (three) times daily with meals.  . Insulin Glargine (LANTUS SOLOSTAR) 100 UNIT/ML Solostar Pen 15 mL 4    Sig: Inject 32 Units into the skin at bedtime.    Return in about 8 weeks (around 03/31/2020).  Karle Plumber, MD, FACP

## 2020-02-04 NOTE — Patient Instructions (Signed)
Please give patient an appointment with the clinical pharmacist in 2 weeks for recheck of diabetes.  Increase Lantus to 32 units daily. Increase NovoLog to 8 units with meals. Try to check your blood sugars at least twice a day before meals and bring in those readings in 2 weeks when you come to see the clinical pharmacist. Try to schedule your eye exam within the next several weeks.   Diabetes Mellitus and Nutrition, Adult When you have diabetes (diabetes mellitus), it is very important to have healthy eating habits because your blood sugar (glucose) levels are greatly affected by what you eat and drink. Eating healthy foods in the appropriate amounts, at about the same times every day, can help you:  Control your blood glucose.  Lower your risk of heart disease.  Improve your blood pressure.  Reach or maintain a healthy weight. Every person with diabetes is different, and each person has different needs for a meal plan. Your health care provider may recommend that you work with a diet and nutrition specialist (dietitian) to make a meal plan that is best for you. Your meal plan may vary depending on factors such as:  The calories you need.  The medicines you take.  Your weight.  Your blood glucose, blood pressure, and cholesterol levels.  Your activity level.  Other health conditions you have, such as heart or kidney disease. How do carbohydrates affect me? Carbohydrates, also called carbs, affect your blood glucose level more than any other type of food. Eating carbs naturally raises the amount of glucose in your blood. Carb counting is a method for keeping track of how many carbs you eat. Counting carbs is important to keep your blood glucose at a healthy level, especially if you use insulin or take certain oral diabetes medicines. It is important to know how many carbs you can safely have in each meal. This is different for every person. Your dietitian can help you calculate how  many carbs you should have at each meal and for each snack. Foods that contain carbs include:  Bread, cereal, rice, pasta, and crackers.  Potatoes and corn.  Peas, beans, and lentils.  Milk and yogurt.  Fruit and juice.  Desserts, such as cakes, cookies, ice cream, and candy. How does alcohol affect me? Alcohol can cause a sudden decrease in blood glucose (hypoglycemia), especially if you use insulin or take certain oral diabetes medicines. Hypoglycemia can be a life-threatening condition. Symptoms of hypoglycemia (sleepiness, dizziness, and confusion) are similar to symptoms of having too much alcohol. If your health care provider says that alcohol is safe for you, follow these guidelines:  Limit alcohol intake to no more than 1 drink per day for nonpregnant women and 2 drinks per day for men. One drink equals 12 oz of beer, 5 oz of wine, or 1 oz of hard liquor.  Do not drink on an empty stomach.  Keep yourself hydrated with water, diet soda, or unsweetened iced tea.  Keep in mind that regular soda, juice, and other mixers may contain a lot of sugar and must be counted as carbs. What are tips for following this plan?  Reading food labels  Start by checking the serving size on the "Nutrition Facts" label of packaged foods and drinks. The amount of calories, carbs, fats, and other nutrients listed on the label is based on one serving of the item. Many items contain more than one serving per package.  Check the total grams (g) of carbs in  one serving. You can calculate the number of servings of carbs in one serving by dividing the total carbs by 15. For example, if a food has 30 g of total carbs, it would be equal to 2 servings of carbs.  Check the number of grams (g) of saturated and trans fats in one serving. Choose foods that have low or no amount of these fats.  Check the number of milligrams (mg) of salt (sodium) in one serving. Most people should limit total sodium intake to  less than 2,300 mg per day.  Always check the nutrition information of foods labeled as "low-fat" or "nonfat". These foods may be higher in added sugar or refined carbs and should be avoided.  Talk to your dietitian to identify your daily goals for nutrients listed on the label. Shopping  Avoid buying canned, premade, or processed foods. These foods tend to be high in fat, sodium, and added sugar.  Shop around the outside edge of the grocery store. This includes fresh fruits and vegetables, bulk grains, fresh meats, and fresh dairy. Cooking  Use low-heat cooking methods, such as baking, instead of high-heat cooking methods like deep frying.  Cook using healthy oils, such as olive, canola, or sunflower oil.  Avoid cooking with butter, cream, or high-fat meats. Meal planning  Eat meals and snacks regularly, preferably at the same times every day. Avoid going long periods of time without eating.  Eat foods high in fiber, such as fresh fruits, vegetables, beans, and whole grains. Talk to your dietitian about how many servings of carbs you can eat at each meal.  Eat 4-6 ounces (oz) of lean protein each day, such as lean meat, chicken, fish, eggs, or tofu. One oz of lean protein is equal to: ? 1 oz of meat, chicken, or fish. ? 1 egg. ?  cup of tofu.  Eat some foods each day that contain healthy fats, such as avocado, nuts, seeds, and fish. Lifestyle  Check your blood glucose regularly.  Exercise regularly as told by your health care provider. This may include: ? 150 minutes of moderate-intensity or vigorous-intensity exercise each week. This could be brisk walking, biking, or water aerobics. ? Stretching and doing strength exercises, such as yoga or weightlifting, at least 2 times a week.  Take medicines as told by your health care provider.  Do not use any products that contain nicotine or tobacco, such as cigarettes and e-cigarettes. If you need help quitting, ask your health care  provider.  Work with a Social worker or diabetes educator to identify strategies to manage stress and any emotional and social challenges. Questions to ask a health care provider  Do I need to meet with a diabetes educator?  Do I need to meet with a dietitian?  What number can I call if I have questions?  When are the best times to check my blood glucose? Where to find more information:  American Diabetes Association: diabetes.org  Academy of Nutrition and Dietetics: www.eatright.[employer] of Diabetes and Digestive and Kidney Diseases (NIH): [employer] Summary  A healthy meal plan will help you control your blood glucose and maintain a healthy lifestyle.  Working with a diet and nutrition specialist (dietitian) can help you make a meal plan that is best for you.  Keep in mind that carbohydrates (carbs) and alcohol have immediate effects on your blood glucose levels. It is important to count carbs and to use alcohol carefully. This information is not intended to replace advice given  to you by your health care provider. Make sure you discuss any questions you have with your health care provider. Document Revised: 11/21/2017 Document Reviewed: 01/13/2017 Elsevier Patient Education  2020 [employer].

## 2020-02-05 LAB — BASIC METABOLIC PANEL WITH GFR
BUN/Creatinine Ratio: 13 (ref 9–23)
BUN: 14 mg/dL (ref 6–24)
CO2: 24 mmol/L (ref 20–29)
Calcium: 9.9 mg/dL (ref 8.7–10.2)
Chloride: 102 mmol/L (ref 96–106)
Creatinine, Ser: 1.09 mg/dL — ABNORMAL HIGH (ref 0.57–1.00)
GFR calc Af Amer: 69 mL/min/{1.73_m2}
GFR calc non Af Amer: 60 mL/min/{1.73_m2}
Glucose: 345 mg/dL — ABNORMAL HIGH (ref 65–99)
Potassium: 4.3 mmol/L (ref 3.5–5.2)
Sodium: 141 mmol/L (ref 134–144)

## 2020-02-05 LAB — LIPID PANEL
Chol/HDL Ratio: 3.5 ratio (ref 0.0–4.4)
Cholesterol, Total: 159 mg/dL (ref 100–199)
HDL: 46 mg/dL (ref >39)
LDL Chol Calc (NIH): 92 mg/dL (ref 0–99)
Triglycerides: 117 mg/dL (ref 0–149)
VLDL Cholesterol Cal: 21 mg/dL (ref 5–40)

## 2020-02-05 LAB — MICROALBUMIN / CREATININE URINE RATIO
Creatinine, Urine: 72.1 mg/dL
Microalb/Creat Ratio: 7 mg/g{creat} (ref 0–29)
Microalbumin, Urine: 4.8 ug/mL

## 2020-02-10 ENCOUNTER — Telehealth: Payer: Self-pay

## 2020-02-10 NOTE — Telephone Encounter (Signed)
-----   Message from Ladell Pier, MD sent at 02/06/2020 10:57 AM EST ----- Let patient know that I have sent her lab results to her MyChart account for her to review.

## 2020-02-10 NOTE — Telephone Encounter (Signed)
Patient name and DOB has been verified Patient was informed of lab results. Patient had no questions.   Patient states that she was out of medication at the time of lab draw.

## 2020-02-11 ENCOUNTER — Other Ambulatory Visit: Payer: Self-pay | Admitting: Physician Assistant

## 2020-02-11 ENCOUNTER — Other Ambulatory Visit: Payer: Self-pay | Admitting: Internal Medicine

## 2020-02-11 DIAGNOSIS — M62838 Other muscle spasm: Secondary | ICD-10-CM

## 2020-02-11 MED FILL — CYCLOBENZAPRINE 10 MG TAB: 10 | 30 days supply | Qty: 30 | Fill #0

## 2020-02-11 MED FILL — TOPIRAMATE 50 MG TABLET: 50 | 15 days supply | Qty: 30 | Fill #3

## 2020-02-11 MED FILL — ?ERGOCALCIFEROL 50000 UNITC: 1.25 MG | 28 days supply | Qty: 4 | Fill #0

## 2020-02-18 ENCOUNTER — Telehealth: Payer: Self-pay

## 2020-02-18 ENCOUNTER — Ambulatory Visit: Payer: Self-pay | Attending: Internal Medicine | Admitting: Pharmacist

## 2020-02-18 ENCOUNTER — Other Ambulatory Visit: Payer: Self-pay

## 2020-02-18 DIAGNOSIS — E1165 Type 2 diabetes mellitus with hyperglycemia: Secondary | ICD-10-CM

## 2020-02-18 DIAGNOSIS — Z794 Long term (current) use of insulin: Secondary | ICD-10-CM

## 2020-02-18 NOTE — Telephone Encounter (Signed)
Will forward to pcp

## 2020-02-18 NOTE — Patient Instructions (Signed)
Thank you for coming to see me today. Please do the following:  1. Increase Lantus to 38 units. Monitor your morning sugars over 3 days. If your blood sugar remains high, increase this dose by 2 units. Do this over the course of the next two weeks. Stop if you get to 44 units or your morning blood sugars start to come down.  2. Continue checking blood sugars at home.  3. Continue making the lifestyle changes we've discussed together during our visit. Diet and exercise play a significant role in improving your blood sugars.  4. Follow-up with me in 2 weeks.     Hypoglycemia or low blood sugar:   Low blood sugar can happen quickly and may become an emergency if not treated right away.   While this shouldn't happen often, it can be brought upon if you skip a meal or do not eat enough. Also, if your insulin or other diabetes medications are dosed too high, this can cause your blood sugar to go to low.   Warning signs of low blood sugar include: 1. Feeling shaky or dizzy 2. Feeling weak or tired  3. Excessive hunger 4. Feeling anxious or upset  5. Sweating even when you aren't exercising  What to do if I experience low blood sugar? 1. Check your blood sugar with your meter. If lower than 70, proceed to step 2.  2. Treat with 3-4 glucose tablets or 3 packets of regular sugar. If these aren't around, you can try hard candy. Yet another option would be to drink 4 ounces of fruit juice or 6 ounces of REGULAR soda.  3. Re-check your sugar in 15 minutes. If it is still below 70, do what you did in step 2 again. If has come back up, go ahead and eat a snack or small meal at this time.

## 2020-02-18 NOTE — Progress Notes (Signed)
    S:      S:     PCP: Dr. Wynetta Emery  No chief complaint on file.  Patient arrives in good spirits. Presents for diabetes management at the request of Dr. Wynetta Emery.  Patient was referred and last seen by PCP 02/04/20.  Patient reports diabetes was diagnosed last year.   Family/Social History:  - FHx: DM (father, mother) - Tobacco: current every day smoker (3 to 4 cigarettes a day) - Alcohol: uses occasionally   Insurance coverage/medication affordability:  - No rx drug coverage  Patient reports adherence with medications.  Current diabetes medications include:  - Lantus 32 units daily - Novolog 8 units TID  Patient denies hypoglycemic events.   Patient reported dietary habits:  - Pt reports limiting sweets  - Reports increasing intake of vegetables and water  Patient-reported exercise habits:  - Does not exercise outside of work - Does report "walking a lot" at work   Patient denies polyuria, polydipsia.  Patient reports some numbness in her feet. Patient reports denies visual changes.  O:  POCT glucose: 324 Home CBGs: no meter - patient reported that they have been "high". Endorses levels in the 200-300s.  Lab Results  Component Value Date   HGBA1C 12.4 (A) 02/04/2020   There were no vitals filed for this visit.  Lipid Panel     Component Value Date/Time   CHOL 159 02/04/2020 0955   TRIG 117 02/04/2020 0955   HDL 46 02/04/2020 0955   CHOLHDL 3.5 02/04/2020 0955   CHOLHDL 3.2 11/14/2014 0707   VLDL 19 11/14/2014 0707   LDLCALC 92 02/04/2020 0955   Clinical ASCVD: No  The 10-year ASCVD risk score Mikey Bussing DC Jr., et al., 2013) is: 4.1%   Values used to calculate the score:     Age: 49 years     Sex: Female     Is Non-Hispanic African American: Yes     Diabetic: Yes     Tobacco smoker: Yes     Systolic Blood Pressure: [id number] mmHg     Is BP treated: No     HDL Cholesterol: 46 mg/dL     Total Cholesterol: 159 mg/dL   A/P: Diabetes longstanding currently  uncontrolled based on A1c and home blood sugars. Patient is able to verbalize appropriate hypoglycemia management plan. Patient is adherent with medication.  -Increase Lantus to 38 units daily. Titrate by 2 units every 3 days to obtain goal fasting sugars. Stop at 44 units.  -Novolog 8 units TID -Extensively discussed pathophysiology of DM, recommended lifestyle interventions, dietary effects on glycemic control -Counseled on s/sx of and management of hypoglycemia -Next A1C anticipated 04/2020.   ASCVD risk - primary prevention in patient with DM. Last LDL is not <70. ASCVD risk score is not >20%  - moderate intensity statin indicated.  -Continued atorvastatin 20 mg daily  Written patient instructions provided. Total time in face to face counseling 30 minutes.   Follow up with PCP tomorrow.   Benard Halsted, PharmD, [place] [phone]

## 2020-02-18 NOTE — Telephone Encounter (Signed)
Contacted pt to go over Dr. Wynetta Emery response in regards to the medication pt is requesting. Pt states she is not requesting Tramadol. Pt states she is requesting Trazodone because she is still having trouble sleeping

## 2020-02-18 NOTE — Telephone Encounter (Signed)
Patient request  Medication refill on her Tramadol. Has no refills left

## 2020-02-19 MED ORDER — TRAZODONE HCL 50 MG PO TABS: 50.0000 mg | ORAL_TABLET | Freq: Every evening | ORAL | 2 refills | Status: DC | PRN

## 2020-02-21 MED FILL — traZODone HCL 50 MG TABS: 50 | 30 days supply | Qty: 30 | Fill #0

## 2020-02-25 ENCOUNTER — Other Ambulatory Visit: Payer: Self-pay | Admitting: Family Medicine

## 2020-02-25 ENCOUNTER — Other Ambulatory Visit: Payer: Self-pay | Admitting: Internal Medicine

## 2020-02-25 DIAGNOSIS — F32A Depression, unspecified: Secondary | ICD-10-CM

## 2020-02-25 DIAGNOSIS — F329 Major depressive disorder, single episode, unspecified: Secondary | ICD-10-CM

## 2020-02-25 MED FILL — DULoxetine HCL 60 MG CPEP: 60 | 30 days supply | Qty: 30 | Fill #0

## 2020-02-25 MED FILL — ?ATORVASTATIN 20 MG TABLET: 20 | 30 days supply | Qty: 30 | Fill #0

## 2020-03-03 ENCOUNTER — Encounter (HOSPITAL_COMMUNITY): Payer: Self-pay

## 2020-03-03 ENCOUNTER — Ambulatory Visit (HOSPITAL_COMMUNITY)
Admission: EM | Admit: 2020-03-03 | Discharge: 2020-03-03 | Disposition: A | Discharge status: Home / Self Care | Payer: Self-pay | Attending: Family Medicine | Admitting: Family Medicine

## 2020-03-03 ENCOUNTER — Ambulatory Visit (INDEPENDENT_AMBULATORY_CARE_PROVIDER_SITE_OTHER): Payer: Self-pay

## 2020-03-03 ENCOUNTER — Telehealth (HOSPITAL_COMMUNITY): Payer: Self-pay | Admitting: Family Medicine

## 2020-03-03 ENCOUNTER — Ambulatory Visit: Payer: Medicaid Other | Admitting: Pharmacist

## 2020-03-03 ENCOUNTER — Other Ambulatory Visit: Payer: Self-pay

## 2020-03-03 DIAGNOSIS — M79671 Pain in right foot: Secondary | ICD-10-CM

## 2020-03-03 DIAGNOSIS — S92514A Nondisplaced fracture of proximal phalanx of right lesser toe(s), initial encounter for closed fracture: Secondary | ICD-10-CM

## 2020-03-03 MED ORDER — HYDROCODONE-ACETAMINOPHEN 5-325 MG PO TABS: 1.0000 | ORAL_TABLET | Freq: Every evening | ORAL | 0 refills | Status: DC | PRN

## 2020-03-03 MED ORDER — HYDROCODONE-ACETAMINOPHEN 5-325 MG PO TABS: 2.0000 | ORAL_TABLET | ORAL | 0 refills | Status: DC | PRN

## 2020-03-03 NOTE — ED Provider Notes (Signed)
[place]    CSN: [id number] Arrival date & time: 03/03/20  1240      History   Chief Complaint Chief Complaint  Patient presents with  . Toe Pain    HPI Whitney Benton is a 49 y.o. female.   49 yo established [place] patient here for evaluation of right toe pain for the past ten to 12 days.  She stubbed the pinky toe then and the pain and swelling are persisting.     Past Medical History:  Diagnosis Date  . Anxiety   . Depression   . Trichomonas infection     Patient Active Problem List   Diagnosis Date Noted  . DKA (diabetic ketoacidoses) ([place]) 10/02/2019  . AKI (acute kidney injury) ([place]) 10/02/2019  . Hyperkalemia 10/02/2019  . Leukocytosis 10/02/2019  . Abnormal LFTs 10/02/2019  . Stressful life events affecting family and household 08/06/2019  . New onset type 2 diabetes mellitus ([place]) 02/04/2019  . Obesity (BMI 30-39.9) 02/04/2019  . Tobacco dependence 02/04/2019  . Left arm weakness 12/06/2014  . Paresthesias/numbness 12/06/2014  . Tobacco abuse 11/14/2014  . Depression   . Mixed incontinence 06/27/2014  . History of TV[facility] in 2006 for fibroids and menorrhagia; benign pathology 09/08/2011    Past Surgical History:  Procedure Laterality Date  . CESAREAN SECTION    . VAGINAL HYSTERECTOMY  2006   Fibroids, menorrhagia, benign pathology    OB History    Gravida  5   Para  3   Term  3   Preterm      AB  2   Living  3     SAB  2   TAB  0   Ectopic  0   Multiple  0   Live Births               Home Medications    Prior to Admission medications   Medication Sig Start Date End Date Taking? Authorizing Provider  atorvastatin (LIPITOR) 20 MG tablet TAKE 1 TABLET (20 MG TOTAL) BY MOUTH DAILY. 02/25/20   Ladell Pier, MD  Blood Glucose Monitoring Suppl (TRUE METRIX METER) w/Device KIT Check blood sugars three times a day 02/04/19   Ladell Pier, MD  cyclobenzaprine (FLEXERIL) 10 MG tablet TAKE 1 TABLET (10 MG TOTAL)  BY MOUTH AT BEDTIME. 02/11/20   Ladell Pier, MD  DULoxetine (CYMBALTA) 60 MG capsule TAKE 1 CAPSULE (60 MG TOTAL) BY MOUTH DAILY. 02/25/20   Ladell Pier, MD  ergocalciferol (VITAMIN D2) 1.25 MG (50000 UT) capsule TAKE 1 CAPSULE (50,000 UNITS TOTAL) BY MOUTH EVERY 7 (SEVEN) DAYS. 02/11/20   Ladell Pier, MD  ergocalciferol (VITAMIN D2) 1.25 MG (50000 UT) capsule SMARTSIG:1 Capsule(s) By Mouth Once a Week 11/24/19   [provider]  fluconazole (DIFLUCAN) 150 MG tablet TAKE 1 TABLET (150 MG TOTAL) BY MOUTH ONCE FOR 1 DOSE. REPEAT IN 3 DAYS IF NEEDED. 09/29/19   [provider]  glucose blood test strip Check blood sugars three times a day 02/04/19   Ladell Pier, MD  HYDROcodone-acetaminophen (NORCO) 5-325 MG tablet Take 1 tablet by mouth at bedtime as needed and may repeat dose one time if needed for moderate pain. 03/03/20   Robyn Haber, MD  insulin aspart (NOVOLOG FLEXPEN) 100 UNIT/ML FlexPen Inject 8 Units into the skin 3 (three) times daily with meals. 02/04/20   Ladell Pier, MD  Insulin Glargine (LANTUS SOLOSTAR) 100 UNIT/ML Solostar Pen Inject 32  Units into the skin at bedtime. 02/04/20   Ladell Pier, MD  Insulin Pen Needle (PEN NEEDLES) 31G X 6 MM MISC Use as directed 02/26/19   Ladell Pier, MD  meloxicam (MOBIC) 7.5 MG tablet Take 2 tablets (15 mg total) by mouth daily. 01/06/20   Bast, Tressia Miners A, NP  omeprazole (PRILOSEC) 20 MG capsule TAKE 1 CAPSULE (20 MG TOTAL) BY MOUTH DAILY. 12/20/19   Ladell Pier, MD  SUMAtriptan (IMITREX) 50 MG tablet May repeat in 2 hours if headache persists or recurs. 11/16/19   Argentina Donovan, PA-C  topiramate (TOPAMAX) 50 MG tablet Take 1 tablet (50 mg total) by mouth 2 (two) times daily. 11/16/19   Argentina Donovan, PA-C  traZODone (DESYREL) 50 MG tablet Take 1 tablet (50 mg total) by mouth at bedtime as needed for sleep. 02/19/20   Ladell Pier, MD  TRUEPLUS LANCETS 28G MISC Check blood sugars  three time a day 02/04/19   Ladell Pier, MD  cetirizine (ZYRTEC) 10 MG tablet Take 1 tablet (10 mg total) by mouth daily. Patient not taking: Reported on 11/02/2019 03/31/19 03/03/20  Charlott Rakes, MD    Family History Family History  Problem Relation Age of Onset  . Diabetes Mother   . Mental illness Mother   . Depression Mother   . Hypertension Mother   . Hypertension Father   . Diabetes Father     Social History Social History   Tobacco Use  . Smoking status: Current Every Day Smoker    Packs/day: 0.25    Years: 8.00    Pack years: 2.00    Types: Cigarettes  . Smokeless tobacco: Never Used  Substance Use Topics  . Alcohol use: Yes    Comment: occasional  . Drug use: No     Allergies   Aspirin and Oxycodone   Review of Systems Review of Systems  Musculoskeletal: Positive for gait problem and joint swelling.  All other systems reviewed and are negative.    Physical Exam Triage Vital Signs ED Triage Vitals  Enc Vitals Group     BP 03/03/20 1313 121/73     Pulse Rate 03/03/20 1313 85     Resp 03/03/20 1313 19     Temp 03/03/20 1313 98.4 F (36.9 C)     Temp Source 03/03/20 1313 Oral     SpO2 03/03/20 1313 100 %     Weight 03/03/20 1311 175 lb (79.4 kg)     Height --      Head Circumference --      Peak Flow --      Pain Score 03/03/20 1310 6     Pain Loc --      Pain Edu? --      Excl. in [place]? --    No data found.  Updated Vital Signs BP 121/73 (BP Location: Right Arm)   Pulse 85   Temp 98.4 F (36.9 C) (Oral)   Resp 19   Wt 79.4 kg   SpO2 100%   BMI 31.00 kg/m   Physical Exam Vitals and nursing note reviewed.  Constitutional:      General: She is not in acute distress.    Appearance: Normal appearance. She is obese.  Eyes:     Conjunctiva/sclera: Conjunctivae normal.  Pulmonary:     Effort: Pulmonary effort is normal.  Musculoskeletal:        General: Swelling, tenderness and signs of injury present. No deformity.  Cervical back: Normal range of motion and neck supple.     Comments: Swelling and tenderness right little toe without deformity  Skin:    General: Skin is warm and dry.  Neurological:     General: No focal deficit present.     Mental Status: She is alert and oriented to person, place, and time.  Psychiatric:        Mood and Affect: Mood normal.        Behavior: Behavior normal.      UC Treatments / Results  Labs (all labs ordered are listed, but only abnormal results are displayed) Labs Reviewed - No data to display  EKG   Radiology DG Foot Complete Right  Result Date: 03/03/2020 CLINICAL DATA:  Right little toe pain for 10-12 days since the patient stubbed it. Initial encounter. EXAM: RIGHT FOOT COMPLETE - 3+ VIEW COMPARISON:  Plain films right foot 01/01/2007. FINDINGS: The patient has an acute nondisplaced fracture of the distal metaphysis of the proximal phalanx of the little toe. The fracture does not involve the articular surface. No other bony abnormality is seen. IMPRESSION: Nondisplaced fracture distal metaphysis proximal phalanx right little toe. Electronically Signed   By: Inge Rise M.D.   On: 03/03/2020 13:53    Procedures Procedures (including critical care time)  Medications Ordered in UC Medications - No data to display  Initial Impression / Assessment and Plan / UC Course  I have reviewed the triage vital signs and the nursing notes.  Pertinent labs & imaging results that were available during my care of the patient were reviewed by me and considered in my medical decision making (see chart for details).    Final Clinical Impressions(s) / UC Diagnoses   Final diagnoses:  Closed nondisplaced fracture of proximal phalanx of lesser toe of right foot, initial encounter     Discharge Instructions     This will probably take another 2 and 1/2 weeks to heal.    ED Prescriptions    Medication Sig Dispense Auth. Provider   HYDROcodone-acetaminophen  (NORCO) 5-325 MG tablet Take 1 tablet by mouth at bedtime as needed and may repeat dose one time if needed for moderate pain. 15 tablet Robyn Haber, MD     I have reviewed the PDMP during this encounter.   Robyn Haber, MD 03/03/20 1358

## 2020-03-03 NOTE — ED Triage Notes (Signed)
Pt is her with right foot right toe pain that started last Wednesday & it has hurt ever since.

## 2020-03-03 NOTE — Discharge Instructions (Addendum)
This will probably take another 2 and 1/2 weeks to heal.

## 2020-03-03 NOTE — Telephone Encounter (Signed)
Patient called nurses station regarding visit earlier today.  Patient saw Dr. Joseph Art who prescribed her hydrocodone 5-325 mg  # 15 medication was sent to see [facility] however they do not fill controlled pain medication.  At patient's request I will send medication on behalf of Dr. Joseph Art to [facility] .  Provider has left the clinic for today.

## 2020-03-07 ENCOUNTER — Ambulatory Visit: Payer: Medicaid Other | Admitting: Pharmacist

## 2020-03-07 NOTE — Progress Notes (Incomplete)
? ? ?  S:    ?PCP: Dr. Wynetta Emery ? ?No chief complaint on file. ? ?Patient arrives ***.  Presents for diabetes evaluation, education, and management. ?Patient was referred and last seen by Primary Care Provider on 02/04/20.  ?Patient last seen by the Clinical Pharmacist on 02/18/20.  ? ?Patient reports Diabetes was diagnosed in last year 12/2018.  ? ?Family/Social History:  ?- FHx: DM & HTN (father, mother), depression (mother) ?- Tobacco: current every day smoker (3 to 4 cigarettes a day) ?- Alcohol: uses occasionally  ?? ?Insurance coverage/medication affordability:  ?- No rx drug coverage ? ?Patient {Actions; denies-reports:120008} adherence with medications. ? ?A1C = 12.4% ?Check Clinic BG ?Review medications and adherence (timing of meds, etc.)  ?Ate or drank anything today? ?At home BGs? (highs vs lows) ?Hyperglycemia sx (nocturia, neuropathy, visual changes, foot exams) ?Hypoglycemia symptoms ?  ?Current diabetes medications include: Lantus 38 units daily, Novolog 8 units TID ? ?Patient {Actions; denies-reports:120008} hypoglycemic events. ? ?Patient reported dietary habits:  ?- Pt reports limiting sweets  ?- Reports increasing intake of vegetables and water ?? ?Patient-reported exercise habits:  ?- Does not exercise outside of work ?- Does report "walking a lot" at work ?  ?Patient {Actions; denies-reports:120008} nocturia (nighttime urination).  ?Patient {Actions; denies-reports:120008} neuropathy (nerve pain). ?Patient {Actions; denies-reports:120008} visual changes. ?Patient {Actions; denies-reports:120008} self foot exams.  ?  ? ?O:  ?Home fasting blood sugars: ***  ?2 hour post-meal/random blood sugars: ***. ? ?Lab Results  ?Component Value Date  ? HGBA1C 12.4 (A) 02/04/2020  ? ?There were no vitals filed for this visit. ? ?Lipid Panel  ?   ?Component Value Date/Time  ? CHOL 159 02/04/2020 0955  ? TRIG 117 02/04/2020 0955  ? HDL 46 02/04/2020 0955  ? CHOLHDL 3.5 02/04/2020 0955  ?  CHOLHDL 3.2 11/14/2014 0707  ? VLDL 19 11/14/2014 0707  ? [place] 92 02/03/19

## 2020-03-10 ENCOUNTER — Other Ambulatory Visit: Payer: Self-pay | Admitting: Physician Assistant

## 2020-03-10 ENCOUNTER — Other Ambulatory Visit: Payer: Self-pay | Admitting: Internal Medicine

## 2020-03-10 DIAGNOSIS — G43519 Persistent migraine aura without cerebral infarction, intractable, without status migrainosus: Secondary | ICD-10-CM

## 2020-03-10 MED FILL — TOPIRAMATE 50 MG TABLET: 50 | 30 days supply | Qty: 60 | Fill #0

## 2020-03-10 MED FILL — ?ERGOCALCIFEROL 50000 UNITC: 1.25 MG | 28 days supply | Qty: 4 | Fill #0

## 2020-03-10 MED FILL — !NOVOLOG FLEXPEN SYRINGE 1: 100/ML | 25 days supply | Qty: 6 | Fill #1

## 2020-03-10 MED FILL — ?BASAGLAR 100 UNITS/ML KWPE: 100 | 27 days supply | Qty: 9 | Fill #1

## 2020-03-14 ENCOUNTER — Other Ambulatory Visit: Payer: Self-pay

## 2020-03-14 ENCOUNTER — Ambulatory Visit: Payer: Self-pay | Attending: Internal Medicine | Admitting: Pharmacist

## 2020-03-14 DIAGNOSIS — Z794 Long term (current) use of insulin: Secondary | ICD-10-CM

## 2020-03-14 DIAGNOSIS — E1165 Type 2 diabetes mellitus with hyperglycemia: Secondary | ICD-10-CM

## 2020-03-14 LAB — GLUCOSE, POCT (MANUAL RESULT ENTRY): POC Glucose: 195 mg/dl — AB (ref 70–99)

## 2020-03-14 MED ORDER — LANTUS SOLOSTAR 100 UNIT/ML ~~LOC~~ SOPN: 44.0000 [IU] | PEN_INJECTOR | Freq: Every day | SUBCUTANEOUS | 4 refills | Status: DC

## 2020-03-14 NOTE — Progress Notes (Signed)
S:    PCP: Dr. Wynetta Emery  No chief complaint on file.  Patient arrives good spirits.  Presents for diabetes evaluation, education, and management. Patient was referred and last seen by Primary Care Provider on 02/04/20.  Patient last seen by the Clinical Pharmacist on 02/18/20.   Patient reports Diabetes was diagnosed in last year 12/2018.   Family/Social History:  - FHx: DM & HTN (father, mother), depression (mother) - Tobacco: current every day smoker (3 to 4 cigarettes a day) - Alcohol: uses occasionally   Insurance coverage/medication affordability:  - No rx drug coverage  Patient reports adherence with medications. Patient reports increasing Lantus to 44 units at bedtime due to elevated sugars. Patient reports home sugars in the evenings are 200-300s likely due to frequent snacking at bedtime. Day sugars range from 120-130s. Post-prandial clinic BG 195 mg/dL.    Current diabetes medications include: Lantus 38 units daily (now taking 44 units), Novolog 8 units TID  Patient denies hypoglycemic events.  Patient reported dietary habits:  - Pt reports limiting sweets and starchy food - Reports increasing intake of vegetables and water  Patient-reported exercise habits:  - Does not exercise outside of work - Does report "walking a lot" at work and to her mother's house down the street   Patient denies nocturia (nighttime urination).  Patient denies neuropathy (nerve pain). Patient denies visual changes    O:  POCT: 195 mg/dL (post-prandial)  2 hour post-meal/random blood sugars: 120-130s during the day, 200-300s during the evenings   Lab Results  Component Value Date   HGBA1C 12.4 (A) 02/04/2020   There were no vitals filed for this visit.  Lipid Panel     Component Value Date/Time   CHOL 159 02/04/2020 0955   TRIG 117 02/04/2020 0955   HDL 46 02/04/2020 0955   CHOLHDL 3.5 02/04/2020 0955   CHOLHDL 3.2 11/14/2014 0707   VLDL 19 11/14/2014 0707   LDLCALC 92  02/04/2020 0955    Clinical Atherosclerotic Cardiovascular Disease (ASCVD): No  The 10-year ASCVD risk score Mikey Bussing DC Jr., et al., 2013) is: 6.7%   Values used to calculate the score:     Age: 27 years     Sex: Female     Is Non-Hispanic African American: Yes     Diabetic: Yes     Tobacco smoker: Yes     Systolic Blood Pressure: 123XX123 mmHg     Is BP treated: No     HDL Cholesterol: 46 mg/dL     Total Cholesterol: 159 mg/dL    A/P: Diabetes newly diagnosed in 12/2018 currently uncontrolled. Home sugars are still elevated in the evenings, 200-300s primarily due to snacking. Patient is adherent with medications. Encouraged patient to eat healthier snacks in the evenings. -Adjusted dose of Lantus to 44 units at bedtime -Instructed patient to check evening sugars for the next 3 days, if blood sugar > 130 mg/dL, increase evening dose of Novolog to 10 units, and continue 8 units with meals in the morning and afternoon.  -Extensively discussed pathophysiology of diabetes, recommended lifestyle interventions, dietary effects on blood sugar control -Counseled on s/sx of and management of hypoglycemia -Next A1C anticipated 04/2020.   ASCVD risk - primary prevention in patient with DM. Last LDL is not <70. ASCVD risk score is not >20%  - moderate intensity statin indicated.  -Continued atorvastatin 20 mg daily  Written patient instructions provided. Total time in face to face counseling 20 minutes.    Follow up  with Pharmacy in 2 weeks  Lorel Monaco, PharmD PGY1 Ambulatory Care Resident [place], PharmD, [place] [phone]

## 2020-03-16 LAB — HM DIABETES EYE EXAM

## 2020-03-27 MED FILL — DULoxetine HCL 60 MG CPEP: 60 | 30 days supply | Qty: 30 | Fill #1

## 2020-03-27 MED FILL — ?ATORVASTATIN 20 MG TABLET: 20 | 30 days supply | Qty: 30 | Fill #1

## 2020-03-28 ENCOUNTER — Other Ambulatory Visit: Payer: Self-pay | Admitting: Internal Medicine

## 2020-03-28 ENCOUNTER — Encounter (HOSPITAL_COMMUNITY): Payer: Self-pay

## 2020-03-28 ENCOUNTER — Ambulatory Visit (INDEPENDENT_AMBULATORY_CARE_PROVIDER_SITE_OTHER): Payer: Self-pay

## 2020-03-28 ENCOUNTER — Ambulatory Visit (HOSPITAL_COMMUNITY)
Admission: EM | Admit: 2020-03-28 | Discharge: 2020-03-28 | Disposition: A | Discharge status: Home / Self Care | Payer: Self-pay | Attending: Family Medicine | Admitting: Family Medicine

## 2020-03-28 ENCOUNTER — Encounter: Payer: Self-pay | Admitting: Pharmacist

## 2020-03-28 ENCOUNTER — Ambulatory Visit: Payer: Self-pay | Attending: Internal Medicine | Admitting: Pharmacist

## 2020-03-28 ENCOUNTER — Other Ambulatory Visit: Payer: Self-pay

## 2020-03-28 DIAGNOSIS — M79671 Pain in right foot: Secondary | ICD-10-CM

## 2020-03-28 DIAGNOSIS — E1165 Type 2 diabetes mellitus with hyperglycemia: Secondary | ICD-10-CM

## 2020-03-28 DIAGNOSIS — S92531G Displaced fracture of distal phalanx of right lesser toe(s), subsequent encounter for fracture with delayed healing: Secondary | ICD-10-CM

## 2020-03-28 DIAGNOSIS — Z794 Long term (current) use of insulin: Secondary | ICD-10-CM

## 2020-03-28 MED ORDER — HYDROCODONE-ACETAMINOPHEN 5-325 MG PO TABS: 1.0000 | ORAL_TABLET | Freq: Four times a day (QID) | ORAL | 0 refills | Status: DC | PRN

## 2020-03-28 MED ORDER — NOVOLOG FLEXPEN 100 UNIT/ML ~~LOC~~ SOPN: PEN_INJECTOR | SUBCUTANEOUS | 3 refills | Status: DC

## 2020-03-28 MED FILL — !NOVOLOG FLEXPEN SYRINGE 1: 100/ML | 30 days supply | Qty: 9 | Fill #0

## 2020-03-28 NOTE — Progress Notes (Signed)
    S:    PCP: Dr. Wynetta Emery  No chief complaint on file.  Patient arrives good spirits.  Presents for diabetes evaluation, education, and management. Patient was referred and last seen by Primary Care Provider on 02/04/20.  Patient last seen by the Clinical Pharmacist on 03/14/20.   Patient reports Diabetes was diagnosed last year 12/2018.   Family/Social History:  - FHx: DM & HTN (father, mother), depression (mother) - Tobacco: current every day smoker (3 to 4 cigarettes a day) - Alcohol: uses occasionally   Insurance coverage/medication affordability:  - No rx drug coverage   Patient reports adherence with medications and is taking as prescribed.  Current diabetes medications include: Lantus 44 units daily, Novolog 07/30/09 units TID  Patient denies hypoglycemic events.  Patient reported dietary habits:  - Pt reports limiting sweets and starchy food - Reports increasing intake of vegetables and water  Patient-reported exercise habits:  - Does not exercise outside of work - Does report "walking a lot" at work and to her mother's house down the street   Patient denies nocturia (nighttime urination).  Patient denies neuropathy (nerve pain). Patient denies visual changes    O:  POCT: 265 mg/dL (post-prandial - reports that she ate early this morning and took her AM Novolog just before this appointment).   2 hour post-meal/random blood sugars: 120-130s during the day, 200-250s in the evening after dinner    Lab Results  Component Value Date   HGBA1C 12.4 (A) 02/04/2020   There were no vitals filed for this visit.  Lipid Panel     Component Value Date/Time   CHOL 159 02/04/2020 0955   TRIG 117 02/04/2020 0955   HDL 46 02/04/2020 0955   CHOLHDL 3.5 02/04/2020 0955   CHOLHDL 3.2 11/14/2014 0707   VLDL 19 11/14/2014 0707   LDLCALC 92 02/04/2020 0955    Clinical Atherosclerotic Cardiovascular Disease (ASCVD): No  The 10-year ASCVD risk score Mikey Bussing DC Jr., et al.,  2013) is: 6.7%   Values used to calculate the score:     Age: 49 years     Sex: Female     Is Non-Hispanic African American: Yes     Diabetic: Yes     Tobacco smoker: Yes     Systolic Blood Pressure: 123XX123 mmHg     Is BP treated: No     HDL Cholesterol: 46 mg/dL     Total Cholesterol: 159 mg/dL    A/P: Diabetes newly diagnosed in 12/2018 currently uncontrolled. Home sugars are slowly improving. Patient is adherent with medications. Encouraged patient to eat healthier snacks in the evenings. Will increase evening dose of Novolog.  -Continue Lantus 44 units at bedtime -Increase Novolog to 07/30/13 units TID before meals -Extensively discussed pathophysiology of diabetes, recommended lifestyle interventions, dietary effects on blood sugar control -Counseled on s/sx of and management of hypoglycemia -Next A1C anticipated 04/2020.   ASCVD risk - primary prevention in patient with DM. Last LDL is not <70. ASCVD risk score is not >20%  - moderate intensity statin indicated.  -Continued atorvastatin 20 mg daily  Written patient instructions provided. Total time in face to face counseling 20 minutes.    Follow up with PCP next week.   Benard Halsted, PharmD, [place] [phone]

## 2020-03-28 NOTE — ED Triage Notes (Signed)
Pt reports having right toe pain since March 2021 when she broke it.

## 2020-03-28 NOTE — ED Provider Notes (Signed)
[place]    CSN: [id number] Arrival date & time: 03/28/20  1229      History   Chief Complaint Chief Complaint  Patient presents with  . Toe Pain    HPI Whitney Benton is a 49 y.o. female.   Patient is a 49 year old female presents today with continued right fifth toe pain, swelling since fracture approximately 1 month ago.  She feels like her symptoms have not improved at all.  She has been wearing the postop shoe daily.  Denies any new injuries to the foot.  Denies any fever or increased warmth to the foot or toe.  ROS per HPI      Past Medical History:  Diagnosis Date  . Anxiety   . Depression   . Trichomonas infection     Patient Active Problem List   Diagnosis Date Noted  . DKA (diabetic ketoacidoses) ([place]) 10/02/2019  . AKI (acute kidney injury) ([place]) 10/02/2019  . Hyperkalemia 10/02/2019  . Leukocytosis 10/02/2019  . Abnormal LFTs 10/02/2019  . Stressful life events affecting family and household 08/06/2019  . New onset type 2 diabetes mellitus ([place]) 02/04/2019  . Obesity (BMI 30-39.9) 02/04/2019  . Tobacco dependence 02/04/2019  . Left arm weakness 12/06/2014  . Paresthesias/numbness 12/06/2014  . Tobacco abuse 11/14/2014  . Depression   . Mixed incontinence 06/27/2014  . History of TVH in 2006 for fibroids and menorrhagia; benign pathology 09/08/2011    Past Surgical History:  Procedure Laterality Date  . CESAREAN SECTION    . VAGINAL HYSTERECTOMY  2006   Fibroids, menorrhagia, benign pathology    OB History    Gravida  5   Para  3   Term  3   Preterm      AB  2   Living  3     SAB  2   TAB  0   Ectopic  0   Multiple  0   Live Births               Home Medications    Prior to Admission medications   Medication Sig Start Date End Date Taking? Authorizing Provider  atorvastatin (LIPITOR) 20 MG tablet TAKE 1 TABLET (20 MG TOTAL) BY MOUTH DAILY. 02/25/20   Ladell Pier, MD  Blood Glucose Monitoring  Suppl (TRUE METRIX METER) w/Device KIT Check blood sugars three times a day 02/04/19   Ladell Pier, MD  cyclobenzaprine (FLEXERIL) 10 MG tablet TAKE 1 TABLET (10 MG TOTAL) BY MOUTH AT BEDTIME. 02/11/20   Ladell Pier, MD  DULoxetine (CYMBALTA) 60 MG capsule TAKE 1 CAPSULE (60 MG TOTAL) BY MOUTH DAILY. 02/25/20   Ladell Pier, MD  ergocalciferol (VITAMIN D2) 1.25 MG (50000 UT) capsule SMARTSIG:1 Capsule(s) By Mouth Once a Week 11/24/19   [provider]  ergocalciferol (VITAMIN D2) 1.25 MG (50000 UT) capsule TAKE 1 CAPSULE (50,000 UNITS TOTAL) BY MOUTH EVERY 7 (SEVEN) DAYS. 03/10/20   Ladell Pier, MD  fluconazole (DIFLUCAN) 150 MG tablet TAKE 1 TABLET (150 MG TOTAL) BY MOUTH ONCE FOR 1 DOSE. REPEAT IN 3 DAYS IF NEEDED. 09/29/19   [provider]  glucose blood test strip Check blood sugars three times a day 02/04/19   Ladell Pier, MD  HYDROcodone-acetaminophen (NORCO/VICODIN) 5-325 MG tablet Take 1-2 tablets by mouth every 6 (six) hours as needed. 03/28/20   , Tressia Miners A, NP  insulin aspart (NOVOLOG FLEXPEN) 100 UNIT/ML FlexPen Inject 8 units before breakfast, 8  units before lunch, and 14 units before dinner. 03/28/20   Ladell Pier, MD  insulin glargine (LANTUS SOLOSTAR) 100 UNIT/ML Solostar Pen Inject 44 Units into the skin at bedtime. 03/14/20   Ladell Pier, MD  Insulin Pen Needle (PEN NEEDLES) 31G X 6 MM MISC Use as directed 02/26/19   Ladell Pier, MD  meloxicam (MOBIC) 7.5 MG tablet Take 2 tablets (15 mg total) by mouth daily. 01/06/20   Nezzie Manera, Tressia Miners A, NP  omeprazole (PRILOSEC) 20 MG capsule TAKE 1 CAPSULE (20 MG TOTAL) BY MOUTH DAILY. 12/20/19   Ladell Pier, MD  SUMAtriptan (IMITREX) 50 MG tablet May repeat in 2 hours if headache persists or recurs. 11/16/19   Argentina Donovan, PA-C  topiramate (TOPAMAX) 50 MG tablet TAKE 1 TABLET (50 MG TOTAL) BY MOUTH 2 (TWO) TIMES DAILY. 03/10/20   Ladell Pier, MD  traZODone (DESYREL) 50 MG  tablet Take 1 tablet (50 mg total) by mouth at bedtime as needed for sleep. 02/19/20   Ladell Pier, MD  TRUEPLUS LANCETS 28G MISC Check blood sugars three time a day 02/04/19   Ladell Pier, MD  cetirizine (ZYRTEC) 10 MG tablet Take 1 tablet (10 mg total) by mouth daily. Patient not taking: Reported on 11/02/2019 03/31/19 03/03/20  Charlott Rakes, MD    Family History Family History  Problem Relation Age of Onset  . Diabetes Mother   . Mental illness Mother   . Depression Mother   . Hypertension Mother   . Hypertension Father   . Diabetes Father     Social History Social History   Tobacco Use  . Smoking status: Current Every Day Smoker    Packs/day: 0.25    Years: 8.00    Pack years: 2.00    Types: Cigarettes  . Smokeless tobacco: Never Used  Substance Use Topics  . Alcohol use: Yes    Comment: occasional  . Drug use: No     Allergies   Aspirin and Oxycodone   Review of Systems Review of Systems   Physical Exam Triage Vital Signs ED Triage Vitals  Enc Vitals Group     BP 03/28/20 1305 115/75     Pulse Rate 03/28/20 1305 91     Resp 03/28/20 1305 18     Temp 03/28/20 1305 98.7 F (37.1 C)     Temp Source 03/28/20 1305 Oral     SpO2 03/28/20 1305 95 %     Weight --      Height --      Head Circumference --      Peak Flow --      Pain Score 03/28/20 1304 9     Pain Loc --      Pain Edu? --      Excl. in [place]? --    No data found.  Updated Vital Signs BP 115/75 (BP Location: Left Arm)   Pulse 91   Temp 98.7 F (37.1 C) (Oral)   Resp 18   SpO2 95%   Visual Acuity Right Eye Distance:   Left Eye Distance:   Bilateral Distance:    Right Eye Near:   Left Eye Near:    Bilateral Near:     Physical Exam Vitals and nursing note reviewed.  Constitutional:      General: She is not in acute distress.    Appearance: Normal appearance. She is not ill-appearing, toxic-appearing or diaphoretic.  HENT:     Head: Normocephalic.  Nose: Nose  normal.  Eyes:     Conjunctiva/sclera: Conjunctivae normal.  Pulmonary:     Effort: Pulmonary effort is normal.  Musculoskeletal:        General: Normal range of motion.     Cervical back: Normal range of motion.     Comments: Tenderness, limited ROM and swelling to right 5th toe No open wounds, redness or drainage.   Skin:    General: Skin is warm and dry.     Findings: No rash.  Neurological:     Mental Status: She is alert.  Psychiatric:        Mood and Affect: Mood normal.      UC Treatments / Results  Labs (all labs ordered are listed, but only abnormal results are displayed) Labs Reviewed - No data to display  EKG   Radiology DG Foot Complete Right  Result Date: 03/28/2020 CLINICAL DATA:  Pain following prior fracture fifth digit EXAM: RIGHT FOOT COMPLETE - 3+ VIEW COMPARISON:  None March 03, 2020 FINDINGS: Frontal, oblique, and lateral views obtained. The fracture of the distal aspect of the fifth proximal phalanx is again noted. There is mild displacement of fracture fragments in this area compared to prior study. Minimal callus formation noted in this area. No new fracture. No dislocation. Joint spaces appear normal. There is a stable posterior calcaneal spur. IMPRESSION: Slight increase in separation of fracture fragments at the distal aspect of the fifth proximal phalanx at previously noted transversely oriented fracture site. Slight callus in this area. No new fracture. No dislocation. There is a posterior calcaneal spur. Electronically Signed   By: Lowella Grip III M.D.   On: 03/28/2020 13:37    Procedures Procedures (including critical care time)  Medications Ordered in UC Medications - No data to display  Initial Impression / Assessment and Plan / UC Course  I have reviewed the triage vital signs and the nursing notes.  Pertinent labs & imaging results that were available during my care of the patient were reviewed by me and considered in my medical  decision making (see chart for details).     Worsening right fifth distal phalanx fracture. Consulted with orthopedics.  Recommended nonweightbearing until follow-up with orthopedics. I will prescribe her some medication for pain in the meantime Final Clinical Impressions(s) / UC Diagnoses   Final diagnoses:  Closed displaced fracture of distal phalanx of lesser toe of right foot with delayed healing, subsequent encounter     Discharge Instructions     You have a worsening fracture of that toe.  You need to be nonweightbearing until you follow-up with orthopedic specialist.  We have provided crutches here for you today. I have refilled the pain medication to use as needed. Rest the foot is much as possible    ED Prescriptions    Medication Sig Dispense Auth. Provider   HYDROcodone-acetaminophen (NORCO/VICODIN) 5-325 MG tablet Take 1-2 tablets by mouth every 6 (six) hours as needed. 12 tablet ,  A, NP     I have reviewed the PDMP during this encounter.   Loura Halt A, NP 03/28/20 1414

## 2020-03-28 NOTE — Discharge Instructions (Addendum)
You have a worsening fracture of that toe.  You need to be nonweightbearing until you follow-up with orthopedic specialist.  We have provided crutches here for you today. I have refilled the pain medication to use as needed. Rest the foot is much as possible

## 2020-03-28 NOTE — Patient Instructions (Addendum)
Thank you for coming to see me today. Please do the following:  1. Increase dinner time Novolog to 14 units. If you experience low blood sugar with this dose, decrease to 12 units.  2. Continue 8 units of Novolog before breakfast and lunch.  3. Continue 44 units of Lantus.  4. Continue checking blood sugars at home.  5. Continue making the lifestyle changes we've discussed together during our visit. Diet and exercise play a significant role in improving your blood sugars.  6. Follow-up with Dr. Wynetta Emery next week.     Hypoglycemia or low blood sugar:   Low blood sugar can happen quickly and may become an emergency if not treated right away.   While this shouldn't happen often, it can be brought upon if you skip a meal or do not eat enough. Also, if your insulin or other diabetes medications are dosed too high, this can cause your blood sugar to go to low.   Warning signs of low blood sugar include: 1. Feeling shaky or dizzy 2. Feeling weak or tired  3. Excessive hunger 4. Feeling anxious or upset  5. Sweating even when you aren't exercising  What to do if I experience low blood sugar? 1. Check your blood sugar with your meter. If lower than 70, proceed to step 2.  2. Treat with 3-4 glucose tablets or 3 packets of regular sugar. If these aren't around, you can try hard candy. Yet another option would be to drink 4 ounces of fruit juice or 6 ounces of REGULAR soda.  3. Re-check your sugar in 15 minutes. If it is still below 70, do what you did in step 2 again. If has come back up, go ahead and eat a snack or small meal at this time.

## 2020-04-03 ENCOUNTER — Encounter: Payer: Self-pay | Admitting: Internal Medicine

## 2020-04-03 ENCOUNTER — Ambulatory Visit: Payer: Self-pay | Attending: Internal Medicine | Admitting: Internal Medicine

## 2020-04-03 ENCOUNTER — Other Ambulatory Visit: Payer: Self-pay

## 2020-04-03 VITALS — BP 122/89 | HR 105 | Temp 98.1°F | Resp 16 | Wt 189.0 lb

## 2020-04-03 DIAGNOSIS — Z886 Allergy status to analgesic agent status: Secondary | ICD-10-CM | POA: Insufficient documentation

## 2020-04-03 DIAGNOSIS — E559 Vitamin D deficiency, unspecified: Secondary | ICD-10-CM | POA: Insufficient documentation

## 2020-04-03 DIAGNOSIS — Z885 Allergy status to narcotic agent status: Secondary | ICD-10-CM | POA: Insufficient documentation

## 2020-04-03 DIAGNOSIS — Z794 Long term (current) use of insulin: Secondary | ICD-10-CM | POA: Insufficient documentation

## 2020-04-03 DIAGNOSIS — E785 Hyperlipidemia, unspecified: Secondary | ICD-10-CM | POA: Insufficient documentation

## 2020-04-03 DIAGNOSIS — Z833 Family history of diabetes mellitus: Secondary | ICD-10-CM | POA: Insufficient documentation

## 2020-04-03 DIAGNOSIS — G4709 Other insomnia: Secondary | ICD-10-CM | POA: Insufficient documentation

## 2020-04-03 DIAGNOSIS — N3946 Mixed incontinence: Secondary | ICD-10-CM | POA: Insufficient documentation

## 2020-04-03 DIAGNOSIS — F17200 Nicotine dependence, unspecified, uncomplicated: Secondary | ICD-10-CM

## 2020-04-03 DIAGNOSIS — Z8249 Family history of ischemic heart disease and other diseases of the circulatory system: Secondary | ICD-10-CM | POA: Insufficient documentation

## 2020-04-03 DIAGNOSIS — Z90710 Acquired absence of both cervix and uterus: Secondary | ICD-10-CM | POA: Insufficient documentation

## 2020-04-03 DIAGNOSIS — Z79899 Other long term (current) drug therapy: Secondary | ICD-10-CM | POA: Insufficient documentation

## 2020-04-03 DIAGNOSIS — S92511A Displaced fracture of proximal phalanx of right lesser toe(s), initial encounter for closed fracture: Secondary | ICD-10-CM | POA: Insufficient documentation

## 2020-04-03 DIAGNOSIS — G43909 Migraine, unspecified, not intractable, without status migrainosus: Secondary | ICD-10-CM | POA: Insufficient documentation

## 2020-04-03 DIAGNOSIS — X58XXXA Exposure to other specified factors, initial encounter: Secondary | ICD-10-CM | POA: Insufficient documentation

## 2020-04-03 DIAGNOSIS — Z6833 Body mass index (BMI) 33.0-33.9, adult: Secondary | ICD-10-CM | POA: Insufficient documentation

## 2020-04-03 DIAGNOSIS — F17210 Nicotine dependence, cigarettes, uncomplicated: Secondary | ICD-10-CM | POA: Insufficient documentation

## 2020-04-03 DIAGNOSIS — F329 Major depressive disorder, single episode, unspecified: Secondary | ICD-10-CM | POA: Insufficient documentation

## 2020-04-03 DIAGNOSIS — E1165 Type 2 diabetes mellitus with hyperglycemia: Secondary | ICD-10-CM | POA: Insufficient documentation

## 2020-04-03 DIAGNOSIS — E669 Obesity, unspecified: Secondary | ICD-10-CM | POA: Insufficient documentation

## 2020-04-03 LAB — GLUCOSE, POCT (MANUAL RESULT ENTRY): POC Glucose: 288 mg/dl — AB (ref 70–99)

## 2020-04-03 MED ORDER — TRAZODONE HCL 50 MG PO TABS: 75.0000 mg | ORAL_TABLET | Freq: Every evening | ORAL | 2 refills | Status: DC | PRN

## 2020-04-03 MED ORDER — LANTUS SOLOSTAR 100 UNIT/ML ~~LOC~~ SOPN: 47.0000 [IU] | PEN_INJECTOR | Freq: Every day | SUBCUTANEOUS | 4 refills | Status: DC

## 2020-04-03 MED ORDER — NOVOLOG FLEXPEN 100 UNIT/ML ~~LOC~~ SOPN: PEN_INJECTOR | SUBCUTANEOUS | 3 refills | Status: DC

## 2020-04-03 MED FILL — ?TRAZODONE HCL 50 TABS: 50 | 30 days supply | Qty: 45 | Fill #0

## 2020-04-03 MED FILL — ?BASAGLAR 100 UNITS/ML KWPE: 100 | 31 days supply | Qty: 15 | Fill #0

## 2020-04-03 NOTE — Progress Notes (Signed)
Pt states she is still unable to sleep

## 2020-04-03 NOTE — Progress Notes (Signed)
Patient ID: Whitney Benton, female    DOB: 05-20-1971  MRN: [id number]  CC: Diabetes   Subjective: Whitney Benton is a 49 y.o. female who presents for chronic ds management Her concerns today include:  Pt with hx of tob dep, depression, migraines, mixed incontinence, DM, obesity, HL, vit D def  DIABETES TYPE 2 Last A1C:   Results for orders placed or performed in visit on 04/03/20  POCT glucose (manual entry)  Result Value Ref Range   POC Glucose 288 (A) 70 - 99 mg/dl    Med Adherence:  [x]  Yes  -since last visit with me, she has seen the clinical pharmacist and Lantus dose was increased to 44 units.  NovoLog dose was changed to 07/30/2013. Medication side effects:  []  Yes    [x]  No Home Monitoring?  [x]  Yes in the mornings but mostly at dinner time    []  No Home glucose results range 200s Diet Adherence: Trying to do better. Still like to snack but now snacking on sugar free yogart and other low calorie things.  Admits that she slacked off some during the Easter holiday.  Not sleeping well at nights and she feels this contributes to sugar being elevated in mornings.  Trazodone not helping.   Exercise: []  Yes    []  No Hypoglycemic episodes?: []  Yes    [x]  No Numbness of the feet? []  Yes    [x]  No Retinopathy hx? []  Yes    []  No Last eye exam: had eye exam 03/29/2020 with Dr. Schuyler Amor.  Told she has cataract, and ? Glaucoma suspect.  Has f/u in 6 mths Wearing new glasses today.  Comments:   Tobacco dependence: She tells me that she continues to work on trying to quit smoking but has not quit as yet.  She was seen at urgent care 03/03/2020 with pain in the right small toe after stubbing her toe.  X-ray revealed nondisplaced fracture of distal metaphysis proximal phalanx.  She was given a postop shoe to wear daily and a prescription for Norco No. 15 tablets.  She returned to urgent care again on 03/28/2020 with continued pain.  Repeat x-ray this time showed slight increase in separation of fracture  fragments at the distal aspect of the fifth proximal phalanx.  She was given an additional prescription for Norco No. 12 tablets.  Patient states she was told to see orthopedic specialist.  She is wearing the postop shoe today.  Reports that the toes still little swollen and painful. Patient Active Problem List   Diagnosis Date Noted  . DKA (diabetic ketoacidoses) ([place]) 10/02/2019  . AKI (acute kidney injury) ([place]) 10/02/2019  . Hyperkalemia 10/02/2019  . Leukocytosis 10/02/2019  . Abnormal LFTs 10/02/2019  . Stressful life events affecting family and household 08/06/2019  . New onset type 2 diabetes mellitus ([place]) 02/04/2019  . Obesity (BMI 30-39.9) 02/04/2019  . Tobacco dependence 02/04/2019  . Left arm weakness 12/06/2014  . Paresthesias/numbness 12/06/2014  . Tobacco abuse 11/14/2014  . Depression   . Mixed incontinence 06/27/2014  . History of TVH in 2006 for fibroids and menorrhagia; benign pathology 09/08/2011     Current Outpatient Medications on File Prior to Visit  Medication Sig Dispense Refill  . atorvastatin (LIPITOR) 20 MG tablet TAKE 1 TABLET (20 MG TOTAL) BY MOUTH DAILY. 30 tablet 3  . Blood Glucose Monitoring Suppl (TRUE METRIX METER) w/Device KIT Check blood sugars three times a day 1 kit 0  . cyclobenzaprine (FLEXERIL) 10  MG tablet TAKE 1 TABLET (10 MG TOTAL) BY MOUTH AT BEDTIME. 30 tablet 1  . DULoxetine (CYMBALTA) 60 MG capsule TAKE 1 CAPSULE (60 MG TOTAL) BY MOUTH DAILY. 30 capsule 3  . ergocalciferol (VITAMIN D2) 1.25 MG (50000 UT) capsule SMARTSIG:1 Capsule(s) By Mouth Once a Week    . ergocalciferol (VITAMIN D2) 1.25 MG (50000 UT) capsule TAKE 1 CAPSULE (50,000 UNITS TOTAL) BY MOUTH EVERY 7 (SEVEN) DAYS. 4 capsule 0  . fluconazole (DIFLUCAN) 150 MG tablet TAKE 1 TABLET (150 MG TOTAL) BY MOUTH ONCE FOR 1 DOSE. REPEAT IN 3 DAYS IF NEEDED.    Marland Kitchen glucose blood test strip Check blood sugars three times a day 100 each 12  . HYDROcodone-acetaminophen (NORCO/VICODIN)  5-325 MG tablet Take 1-2 tablets by mouth every 6 (six) hours as needed. 12 tablet 0  . Insulin Pen Needle (PEN NEEDLES) 31G X 6 MM MISC Use as directed 100 each 0  . meloxicam (MOBIC) 7.5 MG tablet Take 2 tablets (15 mg total) by mouth daily. 30 tablet 0  . omeprazole (PRILOSEC) 20 MG capsule TAKE 1 CAPSULE (20 MG TOTAL) BY MOUTH DAILY. 30 capsule 1  . SUMAtriptan (IMITREX) 50 MG tablet May repeat in 2 hours if headache persists or recurs. 9 tablet 2  . topiramate (TOPAMAX) 50 MG tablet TAKE 1 TABLET (50 MG TOTAL) BY MOUTH 2 (TWO) TIMES DAILY. 30 tablet 3  . TRUEPLUS LANCETS 28G MISC Check blood sugars three time a day 100 each 12  . [DISCONTINUED] cetirizine (ZYRTEC) 10 MG tablet Take 1 tablet (10 mg total) by mouth daily. (Patient not taking: Reported on 11/02/2019) 30 tablet 1   No current facility-administered medications on file prior to visit.    Allergies  Allergen Reactions  . Aspirin Hives  . Oxycodone Nausea And Vomiting    Social History   Socioeconomic History  . Marital status: Significant Other    Spouse name: Not on file  . Number of children: 3  . Years of education: 81  . Highest education level: Not on file  Occupational History  . Not on file  Tobacco Use  . Smoking status: Current Every Day Smoker    Packs/day: 0.25    Years: 8.00    Pack years: 2.00    Types: Cigarettes  . Smokeless tobacco: Never Used  Substance and Sexual Activity  . Alcohol use: Yes    Comment: occasional  . Drug use: No  . Sexual activity: Yes    Birth control/protection: Surgical  Other Topics Concern  . Not on file  Social History Narrative   Patient lives at home with mother and father    Patient has 3 children    Patient is single   Patient has 14 years of education    Patient is right handed    Social Determinants of Health   Financial Resource Strain:   . Difficulty of Paying Living Expenses:   Food Insecurity:   . Worried About Charity fundraiser in the Last  Year:   . Arboriculturist in the Last Year:   Transportation Needs:   . Film/video editor (Medical):   Marland Kitchen Lack of Transportation (Non-Medical):   Physical Activity:   . Days of Exercise per Week:   . Minutes of Exercise per Session:   Stress:   . Feeling of Stress :   Social Connections:   . Frequency of Communication with Friends and Family:   . Frequency of Social Gatherings  with Friends and Family:   . Attends Religious Services:   . Active Member of Clubs or Organizations:   . Attends Archivist Meetings:   Marland Kitchen Marital Status:   Intimate Partner Violence:   . Fear of Current or Ex-Partner:   . Emotionally Abused:   Marland Kitchen Physically Abused:   . Sexually Abused:     Family History  Problem Relation Age of Onset  . Diabetes Mother   . Mental illness Mother   . Depression Mother   . Hypertension Mother   . Hypertension Father   . Diabetes Father     Past Surgical History:  Procedure Laterality Date  . CESAREAN SECTION    . VAGINAL HYSTERECTOMY  2006   Fibroids, menorrhagia, benign pathology    ROS: Review of Systems Negative except as stated above  PHYSICAL EXAM: BP 122/89   Pulse (!) 105   Temp 98.1 F (36.7 C)   Resp 16   Wt 189 lb (85.7 kg)   SpO2 98%   BMI 33.48 kg/m   Physical Exam  General appearance - alert, well appearing, and in no distress Mental status - normal mood, behavior, speech, dress, motor activity, and thought processes Chest - clear to auscultation, no wheezes, rales or rhonchi, symmetric air entry Heart - normal rate, regular rhythm, normal S1, S2, no murmurs, rubs, clicks or gallops Musculoskeletal -right foot: She has mild swelling of the fifth toe. Extremities -no lower extremity edema. Depression screen [facility] 2/9 02/04/2020 07/14/2019 02/26/2019  Decreased Interest 0 1 -  Down, Depressed, Hopeless 1 3 0  PHQ - 2 Score 1 4 0  Altered sleeping 3 3 3   Tired, decreased energy 3 3 3   Change in appetite 3 3 3   Feeling bad or  failure about yourself  0 1 0  Trouble concentrating 1 0 0  Moving slowly or fidgety/restless 1 0 0  Suicidal thoughts 0 0 0  PHQ-9 Score 12 14 9      CMP Latest Ref Rng & Units 02/04/2020 11/02/2019 10/04/2019  Glucose 65 - 99 mg/dL 345(H) 86 288(H)  BUN 6 - 24 mg/dL 14 6 15   Creatinine 0.57 - 1.00 mg/dL 1.09(H) 0.76 0.77  Sodium 134 - 144 mmol/L 141 144 137  Potassium 3.5 - 5.2 mmol/L 4.3 4.2 3.3(L)  Chloride 96 - 106 mmol/L 102 104 103  CO2 20 - 29 mmol/L 24 27 23   Calcium 8.7 - 10.2 mg/dL 9.9 9.8 8.5(L)  Total Protein 6.5 - 8.1 g/dL - - -  Total Bilirubin 0.3 - 1.2 mg/dL - - -  Alkaline Phos 38 - 126 U/L - - -  AST 15 - 41 U/L - - -  ALT 0 - 44 U/L - - -   Lipid Panel     Component Value Date/Time   CHOL 159 02/04/2020 0955   TRIG 117 02/04/2020 0955   HDL 46 02/04/2020 0955   CHOLHDL 3.5 02/04/2020 0955   CHOLHDL 3.2 11/14/2014 0707   VLDL 19 11/14/2014 0707   LDLCALC 92 02/04/2020 0955    CBC    Component Value Date/Time   WBC 10.6 (H) 10/04/2019 0355   RBC 4.41 10/04/2019 0355   HGB 12.4 10/04/2019 0355   HGB 13.6 07/14/2019 1058   HCT 36.8 10/04/2019 0355   HCT 40.5 07/14/2019 1058   PLT 237 10/04/2019 0355   PLT 343 07/14/2019 1058   MCV 83.4 10/04/2019 0355   MCV 85 07/14/2019 1058   MCH 28.1 10/04/2019  0355   MCHC 33.7 10/04/2019 0355   RDW 13.5 10/04/2019 0355   RDW 13.1 07/14/2019 1058   LYMPHSABS 1.2 10/02/2019 1738   LYMPHSABS 2.4 07/14/2019 1058   MONOABS 0.9 10/02/2019 1738   EOSABS 0.0 10/02/2019 1738   EOSABS 0.1 07/14/2019 1058   BASOSABS 0.1 10/02/2019 1738   BASOSABS 0.0 07/14/2019 1058    ASSESSMENT AND PLAN:  1. Type 2 diabetes mellitus with hyperglycemia, with long-term current use of insulin ([place]) Commended her on the small dietary changes she has made so far.  Dietary counseling given. Increase Lantus to 47 units daily and NovoLog to 09/29/2017.  Continue to monitor blood sugars - POCT glucose (manual entry) - insulin aspart  (NOVOLOG FLEXPEN) 100 UNIT/ML FlexPen; Inject 10 units before breakfast, 8 units before lunch, and 18 units before dinner.  Dispense: 9 mL; Refill: 3 - insulin glargine (LANTUS SOLOSTAR) 100 UNIT/ML Solostar Pen; Inject 47 Units into the skin at bedtime.  Dispense: 15 mL; Refill: 4  2. Other insomnia Good sleep hygiene encouraged.  She is agreeable for [place] to increase the dose of trazodone to 75 mg daily - traZODone (DESYREL) 50 MG tablet; Take 1.5 tablets (75 mg total) by mouth at bedtime as needed for sleep.  Dispense: 445 tablet; Refill: 2  3. Closed displaced fracture of proximal phalanx of lesser toe of right foot, initial encounter Advised to buddy tape the fourth and fifth toe.  Continue to wear the post surgical shoe - Ambulatory referral to Orthopedic Surgery  4. Tobacco dependence Continue to encourage her toward smoking cessation.    Patient was given the opportunity to ask questions.  Patient verbalized understanding of the plan and was able to repeat key elements of the plan.   Orders Placed This Encounter  Procedures  . Ambulatory referral to Orthopedic Surgery  . POCT glucose (manual entry)     Requested Prescriptions   Signed Prescriptions Disp Refills  . insulin aspart (NOVOLOG FLEXPEN) 100 UNIT/ML FlexPen 9 mL 3    Sig: Inject 10 units before breakfast, 8 units before lunch, and 18 units before dinner.  . insulin glargine (LANTUS SOLOSTAR) 100 UNIT/ML Solostar Pen 15 mL 4    Sig: Inject 47 Units into the skin at bedtime.  . traZODone (DESYREL) 50 MG tablet 445 tablet 2    Sig: Take 1.5 tablets (75 mg total) by mouth at bedtime as needed for sleep.    Return in about 2 months (around 06/03/2020).  Karle Plumber, MD, FACP

## 2020-04-03 NOTE — Patient Instructions (Signed)
Your blood sugars have improved but are still not at goal. Increase Lantus insulin to 47 units daily. Increase NovoLog insulin to 10 units with breakfast, 8 units with lunch and 18 units with dinner.  I have submitted the referral for you to see the orthopedic specialist.  We have increased the trazodone to 50 mg 1-1/2 tablets daily.

## 2020-04-06 ENCOUNTER — Ambulatory Visit: Payer: Medicaid Other

## 2020-04-12 ENCOUNTER — Encounter: Payer: Self-pay | Admitting: Internal Medicine

## 2020-04-12 DIAGNOSIS — H40009 Preglaucoma, unspecified, unspecified eye: Secondary | ICD-10-CM | POA: Insufficient documentation

## 2020-04-14 ENCOUNTER — Telehealth: Payer: Self-pay | Admitting: Internal Medicine

## 2020-04-14 DIAGNOSIS — G43519 Persistent migraine aura without cerebral infarction, intractable, without status migrainosus: Secondary | ICD-10-CM

## 2020-04-14 DIAGNOSIS — G4709 Other insomnia: Secondary | ICD-10-CM

## 2020-04-14 MED ORDER — TRAZODONE HCL 100 MG PO TABS: 100.0000 mg | ORAL_TABLET | Freq: Every day | ORAL | 1 refills | Status: DC

## 2020-04-14 NOTE — Telephone Encounter (Signed)
Patient came into the office and stated medication subscribed to get sleep use to work somewhat but now has no reaction and patient hasnt been able to sleep in weeks. 1) Medication(s) traZODone  Patient also stated shes having bad headaches again which may also be causing her sleep discomfort

## 2020-04-14 NOTE — Telephone Encounter (Signed)
Contacted pt to go over Dr. Wynetta Emery message. Pt states she understands and pt has been schedule for a virtual visit for Tuesday 04/18/20 at 150pm

## 2020-04-14 NOTE — Telephone Encounter (Signed)
Will forward to pcp

## 2020-04-18 ENCOUNTER — Ambulatory Visit: Payer: Medicaid Other | Attending: Internal Medicine | Admitting: Internal Medicine

## 2020-04-18 ENCOUNTER — Other Ambulatory Visit: Payer: Self-pay

## 2020-04-18 DIAGNOSIS — G43001 Migraine without aura, not intractable, with status migrainosus: Secondary | ICD-10-CM

## 2020-04-18 MED ORDER — SUMATRIPTAN SUCCINATE 50 MG PO TABS: ORAL_TABLET | ORAL | 2 refills | Status: DC

## 2020-04-18 MED FILL — SUMATRIPTAN SUCC 50 MG TAB: 50 | 23 days supply | Qty: 9 | Fill #0

## 2020-04-18 NOTE — Progress Notes (Signed)
Virtual Visit via Telephone Note Due to current restrictions/limitations of in-office visits due to the COVID-19 pandemic, this scheduled clinical appointment was converted to a telehealth visit  I connected with Whitney Benton on 04/18/20 at 2:18 p.m by telephone and verified that I am speaking with the correct person using two identifiers. I am in my office.  The patient is at home.  Only the patient and myself participated in this encounter.  I discussed the limitations, risks, security and privacy concerns of performing an evaluation and management service by telephone and the availability of in person appointments. I also discussed with the patient that there may be a patient responsible charge related to this service. The patient expressed understanding and agreed to proceed.   History of Present Illness: Pt with hx of tob dep, depression,migraines,mixed incontinence, DM, obesity, HL, vit D def This is a UC visit.  Pt c/o headache for the past 1-1/2 weeks.  Headache is all over and associated with photophobia, phonophobia and blurred vision.  No associated nausea or vomiting.  The headaches have been constant occurring every day.  She has tried taking extra strength Tylenol twice a day.  It eases the headache a bit but does not take it away completely.  Denies any nasal congestion, rhinorrhea, sneezing or other allergy symptoms at this time.  She has history of migraines for which I have placed on Topamax and Imitrex last year.  She tells me that she is taking the Topamax but does not have Imitrex.  States that in the past the pharmacy is only given her for another time.   Outpatient Encounter Medications as of 04/18/2020  Medication Sig  . atorvastatin (LIPITOR) 20 MG tablet TAKE 1 TABLET (20 MG TOTAL) BY MOUTH DAILY.  Marland Kitchen Blood Glucose Monitoring Suppl (TRUE METRIX METER) w/Device KIT Check blood sugars three times a day  . cyclobenzaprine (FLEXERIL) 10 MG tablet TAKE 1 TABLET (10 MG TOTAL)  BY MOUTH AT BEDTIME.  . DULoxetine (CYMBALTA) 60 MG capsule TAKE 1 CAPSULE (60 MG TOTAL) BY MOUTH DAILY.  . ergocalciferol (VITAMIN D2) 1.25 MG (50000 UT) capsule SMARTSIG:1 Capsule(s) By Mouth Once a Week  . ergocalciferol (VITAMIN D2) 1.25 MG (50000 UT) capsule TAKE 1 CAPSULE (50,000 UNITS TOTAL) BY MOUTH EVERY 7 (SEVEN) DAYS.  . fluconazole (DIFLUCAN) 150 MG tablet TAKE 1 TABLET (150 MG TOTAL) BY MOUTH ONCE FOR 1 DOSE. REPEAT IN 3 DAYS IF NEEDED.  Marland Kitchen glucose blood test strip Check blood sugars three times a day  . HYDROcodone-acetaminophen (NORCO/VICODIN) 5-325 MG tablet Take 1-2 tablets by mouth every 6 (six) hours as needed. (Patient not taking: Reported on 04/18/2020)  . insulin aspart (NOVOLOG FLEXPEN) 100 UNIT/ML FlexPen Inject 10 units before breakfast, 8 units before lunch, and 18 units before dinner.  . insulin glargine (LANTUS SOLOSTAR) 100 UNIT/ML Solostar Pen Inject 47 Units into the skin at bedtime.  . Insulin Pen Needle (PEN NEEDLES) 31G X 6 MM MISC Use as directed  . meloxicam (MOBIC) 7.5 MG tablet Take 2 tablets (15 mg total) by mouth daily.  Marland Kitchen omeprazole (PRILOSEC) 20 MG capsule TAKE 1 CAPSULE (20 MG TOTAL) BY MOUTH DAILY.  . SUMAtriptan (IMITREX) 50 MG tablet May repeat in 2 hours if headache persists or recurs.  . topiramate (TOPAMAX) 50 MG tablet TAKE 1 TABLET (50 MG TOTAL) BY MOUTH 2 (TWO) TIMES DAILY.  . traZODone (DESYREL) 100 MG tablet Take 1 tablet (100 mg total) by mouth at bedtime.  . TRUEPLUS LANCETS  28G MISC Check blood sugars three time a day  . [DISCONTINUED] cetirizine (ZYRTEC) 10 MG tablet Take 1 tablet (10 mg total) by mouth daily. (Patient not taking: Reported on 11/02/2019)   No facility-administered encounter medications on file as of 04/18/2020.    Observations/Objective: No direct observation done as this was a telephone encounter.  Assessment and Plan: 1. Migraine without aura and with status migrainosus, not intractable -Given that this migraine has  been going on for over a week, she would probably benefit from a dose of prednisone for several days.  However I do not want to mess up her blood sugars.  I have sent a refill on Imitrex.  I went over with her how to take the medication.  Continue the Topamax. - SUMAtriptan (IMITREX) 50 MG tablet; Take 1 tablet at the start of the headache.  May repeat in 2 hours if no relief.  Max 2 tabs/24 hours  Dispense: 10 tablet; Refill: 2   Follow Up Instructions: Follow-up if no improvement or any worsening of symptoms.   I discussed the assessment and treatment plan with the patient. The patient was provided an opportunity to ask questions and all were answered. The patient agreed with the plan and demonstrated an understanding of the instructions.   The patient was advised to call back or seek an in-person evaluation if the symptoms worsen or if the condition fails to improve as anticipated.  I provided 9 minutes of non-face-to-face time during this encounter.   Karle Plumber, MD

## 2020-04-24 MED FILL — DULoxetine HCL 60 MG CPEP: 60 | 30 days supply | Qty: 30 | Fill #2

## 2020-04-24 MED FILL — ?ATORVASTATIN 20 MG TABLET: 20 | 30 days supply | Qty: 30 | Fill #2

## 2020-05-02 NOTE — Telephone Encounter (Signed)
Will forward to pcp

## 2020-05-02 NOTE — Telephone Encounter (Signed)
Patient stopped by the office and requested to inform pcp that the listed medications are not working. Please follow up at your earliest convenience.   SUMAtriptan (IMITREX) 50 MG tablet [[id number]]  traZODone (DESYREL) 100 MG tablet [id number]

## 2020-05-03 ENCOUNTER — Encounter: Payer: Self-pay | Admitting: Neurology

## 2020-05-03 MED ORDER — MELATONIN 3 MG PO TABS: 3.0000 mg | ORAL_TABLET | Freq: Every day | ORAL | 2 refills | Status: DC

## 2020-05-03 MED ORDER — TOPIRAMATE 50 MG PO TABS: 50.0000 mg | ORAL_TABLET | Freq: Every day | ORAL | 3 refills | Status: DC

## 2020-05-03 NOTE — Telephone Encounter (Signed)
Contacted pt to go over Dr. Johnson message pt didn't answer lvm asking pt to give a call back at her earliest convenience  

## 2020-05-08 MED FILL — !NOVOLOG FLEXPEN SYRINGE 1: 100/ML | 30 days supply | Qty: 9 | Fill #1

## 2020-05-15 MED FILL — CYCLOBENZAPRINE 10 MG TAB: 10 | 30 days supply | Qty: 30 | Fill #1

## 2020-05-16 ENCOUNTER — Telehealth: Payer: Self-pay | Admitting: Internal Medicine

## 2020-05-16 NOTE — Telephone Encounter (Signed)
Patient called in and requested for something else for sleep. Patient stated that the melatonin is not working. Please follow up at you earliest convenience.   [place], [address]  [place], [place] [address]

## 2020-05-16 NOTE — Telephone Encounter (Signed)
Will forward to pcp

## 2020-05-17 MED ORDER — HYDROXYZINE HCL 50 MG PO TABS: 50.0000 mg | ORAL_TABLET | Freq: Every day | ORAL | 1 refills | Status: DC

## 2020-05-17 MED FILL — TOPIRAMATE 50 MG TABLET: 50 | 30 days supply | Qty: 30 | Fill #0

## 2020-05-17 MED FILL — hydrOXYzine HCL 50 MG TABS: 50 | 30 days supply | Qty: 30 | Fill #0

## 2020-05-17 NOTE — Telephone Encounter (Signed)
Contacted pt and went over Dr. Wynetta Emery message and also went over the message for 04/14/20 pt is aware and doesn't have any questions or concerns

## 2020-05-23 MED FILL — ?ATORVASTATIN 20 MG TABLET: 20 | 30 days supply | Qty: 30 | Fill #3

## 2020-05-23 MED FILL — DULoxetine HCL 60 MG CPEP: 60 | 30 days supply | Qty: 30 | Fill #3

## 2020-06-08 ENCOUNTER — Other Ambulatory Visit: Payer: Self-pay | Admitting: Internal Medicine

## 2020-06-08 ENCOUNTER — Ambulatory Visit: Payer: Self-pay | Attending: Internal Medicine | Admitting: Internal Medicine

## 2020-06-08 ENCOUNTER — Other Ambulatory Visit: Payer: Self-pay

## 2020-06-08 ENCOUNTER — Encounter: Payer: Self-pay | Admitting: Internal Medicine

## 2020-06-08 VITALS — BP 122/59 | HR 105 | Temp 97.9°F | Resp 16 | Wt 189.4 lb

## 2020-06-08 DIAGNOSIS — Z885 Allergy status to narcotic agent status: Secondary | ICD-10-CM | POA: Insufficient documentation

## 2020-06-08 DIAGNOSIS — Z6833 Body mass index (BMI) 33.0-33.9, adult: Secondary | ICD-10-CM | POA: Insufficient documentation

## 2020-06-08 DIAGNOSIS — Z833 Family history of diabetes mellitus: Secondary | ICD-10-CM | POA: Insufficient documentation

## 2020-06-08 DIAGNOSIS — E669 Obesity, unspecified: Secondary | ICD-10-CM | POA: Insufficient documentation

## 2020-06-08 DIAGNOSIS — E1165 Type 2 diabetes mellitus with hyperglycemia: Secondary | ICD-10-CM | POA: Insufficient documentation

## 2020-06-08 DIAGNOSIS — Z79899 Other long term (current) drug therapy: Secondary | ICD-10-CM | POA: Insufficient documentation

## 2020-06-08 DIAGNOSIS — G43519 Persistent migraine aura without cerebral infarction, intractable, without status migrainosus: Secondary | ICD-10-CM | POA: Insufficient documentation

## 2020-06-08 DIAGNOSIS — Z886 Allergy status to analgesic agent status: Secondary | ICD-10-CM | POA: Insufficient documentation

## 2020-06-08 DIAGNOSIS — Z8249 Family history of ischemic heart disease and other diseases of the circulatory system: Secondary | ICD-10-CM | POA: Insufficient documentation

## 2020-06-08 DIAGNOSIS — Z794 Long term (current) use of insulin: Secondary | ICD-10-CM | POA: Insufficient documentation

## 2020-06-08 DIAGNOSIS — F17210 Nicotine dependence, cigarettes, uncomplicated: Secondary | ICD-10-CM | POA: Insufficient documentation

## 2020-06-08 DIAGNOSIS — F329 Major depressive disorder, single episode, unspecified: Secondary | ICD-10-CM | POA: Insufficient documentation

## 2020-06-08 DIAGNOSIS — Z6379 Other stressful life events affecting family and household: Secondary | ICD-10-CM | POA: Insufficient documentation

## 2020-06-08 LAB — GLUCOSE, POCT (MANUAL RESULT ENTRY)
POC Glucose: 429 mg/dl — AB (ref 70–99)
POC Glucose: 344 mg/dl — AB (ref 70–99)

## 2020-06-08 LAB — POCT GLYCOSYLATED HEMOGLOBIN (HGB A1C)

## 2020-06-08 LAB — POCT URINALYSIS DIP (CLINITEK)
Bilirubin, UA: NEGATIVE
Glucose, UA: 500 mg/dL — AB
Ketones, POC UA: NEGATIVE mg/dL
Nitrite, UA: NEGATIVE
POC PROTEIN,UA: NEGATIVE
Spec Grav, UA: 1.015 (ref 1.010–1.025)
Urobilinogen, UA: 1 E.U./dL
pH, UA: 7 (ref 5.0–8.0)

## 2020-06-08 MED ORDER — LANTUS SOLOSTAR 100 UNIT/ML ~~LOC~~ SOPN: 56.0000 [IU] | PEN_INJECTOR | Freq: Every day | SUBCUTANEOUS | 4 refills | Status: DC

## 2020-06-08 MED ORDER — TOPIRAMATE 100 MG PO TABS: 100.0000 mg | ORAL_TABLET | Freq: Every day | ORAL | 3 refills | Status: DC

## 2020-06-08 MED ORDER — INSULIN ASPART 100 UNIT/ML ~~LOC~~ SOLN
8.0000 [IU] | Freq: Once | SUBCUTANEOUS | Status: AC
  Administered 2020-06-08: 8 [IU] via SUBCUTANEOUS

## 2020-06-08 MED ORDER — NOVOLOG FLEXPEN 100 UNIT/ML ~~LOC~~ SOPN: PEN_INJECTOR | SUBCUTANEOUS | 3 refills | Status: DC

## 2020-06-08 MED FILL — $novoLOG FLEXPEN SYRINGE: 100 | 32 days supply | Qty: 18 | Fill #0

## 2020-06-08 MED FILL — TOPIRAMATE 100 MG TABS: 100 | 30 days supply | Qty: 30 | Fill #0

## 2020-06-08 MED FILL — ?BASAGLAR 100 UNITS/ML KWPE: 100 | 26 days supply | Qty: 15 | Fill #0

## 2020-06-08 NOTE — Progress Notes (Signed)
Patient ID: Whitney Benton, female    DOB: Jan 17, 1971  MRN: [id number]  CC: Diabetes and Migraine   Subjective: Whitney Benton is a 49 y.o. female who presents for 2 mth f/u DM, migraines Her concerns today include:  Pt with hx of tob dep, depression,migraines,mixed incontinence, DM, obesity, HL, vit D def  DIABETES TYPE 2 Last A1C:   Results for orders placed or performed in visit on 06/08/20  POCT glucose (manual entry)  Result Value Ref Range   POC Glucose 429 (A) 70 - 99 mg/dl  POCT glycosylated hemoglobin (Hb A1C)  Result Value Ref Range   Hemoglobin A1C     HbA1c POC (<> result, manual entry)     HbA1c, POC (prediabetic range)     HbA1c, POC (controlled diabetic range)    POCT URINALYSIS DIP (CLINITEK)  Result Value Ref Range   Color, UA yellow yellow   Clarity, UA clear clear   Glucose, UA =500 (A) negative mg/dL   Bilirubin, UA negative negative   Ketones, POC UA negative negative mg/dL   Spec Grav, UA 1.015 1.010 - 1.025   Blood, UA trace-lysed (A) negative   pH, UA 7.0 5.0 - 8.0   POC PROTEIN,UA negative negative, trace   Urobilinogen, UA 1.0 0.2 or 1.0 E.U./dL   Nitrite, UA Negative Negative   Leukocytes, UA Trace (A) Negative   A1C today> 15 Med Adherence:  [x]  Yes  -reports compliance with Lantus 48 units and Novolog 18/8/18 with meals.  Blood sugar over 400 this morning.  She tells me that she took her morning dose of NovoLog with breakfast already. Medication side effects:  []  Yes    [x]  No Home Monitoring?  [x]  Yes  -in afternoons.  Home glucose results range: 150-200s Diet Adherence: [x]  Yes -less staarches Exercise: []  Yes    [x]  No.  Has a lot going on at home trying to care for her mother and assist in the care of her sister's children Hypoglycemic episodes?: []  Yes    [x]  No Numbness of the feet? []  Yes    [x]  No Retinopathy hx? []  Yes    [x]  No Last eye exam:  Comments:  Over all does not feel good.  Positive polyuria and dipsia.    Depression:  a lot of family stress with helping to care for mom.  Also some transportation issues. Lacks energy. Finds Cymbalta helpful. Feels she would benefit from counseling.  Migraines: "they still kicking me in my butt." Feels anything contributes to it "the sun, TV, anything." HA daily.  Imitrex does not help.   -has neurology appt next mth   Patient Active Problem List   Diagnosis Date Noted  . Glaucoma suspect 04/12/2020  . DKA (diabetic ketoacidoses) ([place]) 10/02/2019  . AKI (acute kidney injury) ([facility]) 10/02/2019  . Hyperkalemia 10/02/2019  . Leukocytosis 10/02/2019  . Abnormal LFTs 10/02/2019  . Stressful life events affecting family and household 08/06/2019  . New onset type 2 diabetes mellitus ([facility]) 02/04/2019  . Obesity (BMI 30-39.9) 02/04/2019  . Tobacco dependence 02/04/2019  . Left arm weakness 12/06/2014  . Paresthesias/numbness 12/06/2014  . Tobacco abuse 11/14/2014  . Depression   . Mixed incontinence 06/27/2014  . History of TVH in 2006 for fibroids and menorrhagia; benign pathology 09/08/2011     Current Outpatient Medications on File Prior to Visit  Medication Sig Dispense Refill  . atorvastatin (LIPITOR) 20 MG tablet TAKE 1 TABLET (20 MG TOTAL) BY MOUTH DAILY. [place]  tablet 3  . Blood Glucose Monitoring Suppl (TRUE METRIX METER) w/Device KIT Check blood sugars three times a day 1 kit 0  . cyclobenzaprine (FLEXERIL) 10 MG tablet TAKE 1 TABLET (10 MG TOTAL) BY MOUTH AT BEDTIME. 30 tablet 1  . DULoxetine (CYMBALTA) 60 MG capsule TAKE 1 CAPSULE (60 MG TOTAL) BY MOUTH DAILY. 30 capsule 3  . ergocalciferol (VITAMIN D2) 1.25 MG (50000 UT) capsule SMARTSIG:1 Capsule(s) By Mouth Once a Week    . ergocalciferol (VITAMIN D2) 1.25 MG (50000 UT) capsule TAKE 1 CAPSULE (50,000 UNITS TOTAL) BY MOUTH EVERY 7 (SEVEN) DAYS. 4 capsule 0  . fluconazole (DIFLUCAN) 150 MG tablet TAKE 1 TABLET (150 MG TOTAL) BY MOUTH ONCE FOR 1 DOSE. REPEAT IN 3 DAYS IF NEEDED.    Marland Kitchen glucose blood test strip Check  blood sugars three times a day 100 each 12  . hydrOXYzine (ATARAX/VISTARIL) 50 MG tablet Take 1 tablet (50 mg total) by mouth at bedtime. 30 tablet 1  . insulin aspart (NOVOLOG FLEXPEN) 100 UNIT/ML FlexPen Inject 10 units before breakfast, 8 units before lunch, and 18 units before dinner. 9 mL 3  . insulin glargine (LANTUS SOLOSTAR) 100 UNIT/ML Solostar Pen Inject 47 Units into the skin at bedtime. 15 mL 4  . Insulin Pen Needle (PEN NEEDLES) 31G X 6 MM MISC Use as directed 100 each 0  . melatonin 3 MG TABS tablet Take 1 tablet (3 mg total) by mouth at bedtime. 30 tablet 2  . meloxicam (MOBIC) 7.5 MG tablet Take 2 tablets (15 mg total) by mouth daily. 30 tablet 0  . omeprazole (PRILOSEC) 20 MG capsule TAKE 1 CAPSULE (20 MG TOTAL) BY MOUTH DAILY. 30 capsule 1  . SUMAtriptan (IMITREX) 50 MG tablet Take 1 tablet at the start of the headache.  May repeat in 2 hours if no relief.  Max 2 tabs/24 hours 10 tablet 2  . topiramate (TOPAMAX) 50 MG tablet Take 1 tablet (50 mg total) by mouth at bedtime. 30 tablet 3  . TRUEPLUS LANCETS 28G MISC Check blood sugars three time a day 100 each 12  . [DISCONTINUED] cetirizine (ZYRTEC) 10 MG tablet Take 1 tablet (10 mg total) by mouth daily. (Patient not taking: Reported on 11/02/2019) 30 tablet 1   No current facility-administered medications on file prior to visit.    Allergies  Allergen Reactions  . Aspirin Hives  . Oxycodone Nausea And Vomiting    Social History   Socioeconomic History  . Marital status: Significant Other    Spouse name: Not on file  . Number of children: 3  . Years of education: 56  . Highest education level: Not on file  Occupational History  . Not on file  Tobacco Use  . Smoking status: Current Every Day Smoker    Packs/day: 0.25    Years: 8.00    Pack years: 2.00    Types: Cigarettes  . Smokeless tobacco: Never Used  Vaping Use  . Vaping Use: Never used  Substance and Sexual Activity  . Alcohol use: Yes    Comment:  occasional  . Drug use: No  . Sexual activity: Yes    Birth control/protection: Surgical  Other Topics Concern  . Not on file  Social History Narrative   Patient lives at home with mother and father    Patient has 3 children    Patient is single   Patient has 14 years of education    Patient is right handed  Social Determinants of Health   Financial Resource Strain:   . Difficulty of Paying Living Expenses:   Food Insecurity:   . Worried About Charity fundraiser in the Last Year:   . Arboriculturist in the Last Year:   Transportation Needs:   . Film/video editor (Medical):   Marland Kitchen Lack of Transportation (Non-Medical):   Physical Activity:   . Days of Exercise per Week:   . Minutes of Exercise per Session:   Stress:   . Feeling of Stress :   Social Connections:   . Frequency of Communication with Friends and Family:   . Frequency of Social Gatherings with Friends and Family:   . Attends Religious Services:   . Active Member of Clubs or Organizations:   . Attends Archivist Meetings:   Marland Kitchen Marital Status:   Intimate Partner Violence:   . Fear of Current or Ex-Partner:   . Emotionally Abused:   Marland Kitchen Physically Abused:   . Sexually Abused:     Family History  Problem Relation Age of Onset  . Diabetes Mother   . Mental illness Mother   . Depression Mother   . Hypertension Mother   . Hypertension Father   . Diabetes Father     Past Surgical History:  Procedure Laterality Date  . CESAREAN SECTION    . VAGINAL HYSTERECTOMY  2006   Fibroids, menorrhagia, benign pathology    ROS: Review of Systems Negative except as stated above  PHYSICAL EXAM: BP (!) 122/59   Pulse (!) 105   Temp 97.9 F (36.6 C)   Resp 16   Wt 189 lb 6.4 oz (85.9 kg)   SpO2 95%   BMI 33.55 kg/m   Wt Readings from Last 3 Encounters:  06/08/20 189 lb 6.4 oz (85.9 kg)  04/03/20 189 lb (85.7 kg)  03/03/20 175 lb (79.4 kg)    Physical Exam  General appearance - alert, well  appearing, and in no distress Mental status -patient appears despondent.  Quiet demeanor. Chest - clear to auscultation, no wheezes, rales or rhonchi, symmetric air entry Heart - normal rate, regular rhythm, normal S1, S2, no murmurs, rubs, clicks or gallops Extremities - peripheral pulses normal, no pedal edema, no clubbing or cyanosis   Depression screen [facility] 2/9 06/08/2020 02/04/2020 07/14/2019  Decreased Interest 1 0 1  Down, Depressed, Hopeless 2 1 3   PHQ - 2 Score 3 1 4   Altered sleeping 3 3 3   Tired, decreased energy 3 3 3   Change in appetite 3 3 3   Feeling bad or failure about yourself  2 0 1  Trouble concentrating 1 1 0  Moving slowly or fidgety/restless 0 1 0  Suicidal thoughts 0 0 0  PHQ-9 Score 15 12 14     CMP Latest Ref Rng & Units 02/04/2020 11/02/2019 10/04/2019  Glucose 65 - 99 mg/dL 345(H) 86 288(H)  BUN 6 - 24 mg/dL 14 6 15   Creatinine 0.57 - 1.00 mg/dL 1.09(H) 0.76 0.77  Sodium 134 - 144 mmol/L 141 144 137  Potassium 3.5 - 5.2 mmol/L 4.3 4.2 3.3(L)  Chloride 96 - 106 mmol/L 102 104 103  CO2 20 - 29 mmol/L 24 27 23   Calcium 8.7 - 10.2 mg/dL 9.9 9.8 8.5(L)  Total Protein 6.5 - 8.1 g/dL - - -  Total Bilirubin 0.3 - 1.2 mg/dL - - -  Alkaline Phos 38 - 126 U/L - - -  AST 15 - 41 U/L - - -  ALT 0 - 44 U/L - - -   Lipid Panel     Component Value Date/Time   CHOL 159 02/04/2020 0955   TRIG 117 02/04/2020 0955   HDL 46 02/04/2020 0955   CHOLHDL 3.5 02/04/2020 0955   CHOLHDL 3.2 11/14/2014 0707   VLDL 19 11/14/2014 0707   LDLCALC 92 02/04/2020 0955    CBC    Component Value Date/Time   WBC 10.6 (H) 10/04/2019 0355   RBC 4.41 10/04/2019 0355   HGB 12.4 10/04/2019 0355   HGB 13.6 07/14/2019 1058   HCT 36.8 10/04/2019 0355   HCT 40.5 07/14/2019 1058   PLT 237 10/04/2019 0355   PLT 343 07/14/2019 1058   MCV 83.4 10/04/2019 0355   MCV 85 07/14/2019 1058   MCH 28.1 10/04/2019 0355   MCHC 33.7 10/04/2019 0355   RDW 13.5 10/04/2019 0355   RDW 13.1  07/14/2019 1058   LYMPHSABS 1.2 10/02/2019 1738   LYMPHSABS 2.4 07/14/2019 1058   MONOABS 0.9 10/02/2019 1738   EOSABS 0.0 10/02/2019 1738   EOSABS 0.1 07/14/2019 1058   BASOSABS 0.1 10/02/2019 1738   BASOSABS 0.0 07/14/2019 1058    ASSESSMENT AND PLAN: 1. Type 2 diabetes mellitus with hyperglycemia, with long-term current use of insulin (HCC) Not at goal.  Given 8 units of NovoLog today in the office with subsequent decrease of blood sugar into the 300s.  I question compliance with medications but she tells me that she is taking her insulin consistently. -Recommend increasing Lantus insulin to 56 units daily and NovoLog to 22 units with breakfast and dinner and 12 units with lunch. -Advised to check blood sugars before breakfast and dinner and bring in log in 2 weeks to meet with the clinical pharmacist. -Dietary counseling given.  - POCT glucose (manual entry) - POCT glycosylated hemoglobin (Hb A1C) - POCT URINALYSIS DIP (CLINITEK) - insulin glargine (LANTUS SOLOSTAR) 100 UNIT/ML Solostar Pen; Inject 56 Units into the skin at bedtime.  Dispense: 15 mL; Refill: 4 - insulin aspart (NOVOLOG FLEXPEN) 100 UNIT/ML FlexPen; Inject 22 units before breakfast, 12 units before lunch, and 22 units before dinner.  Dispense: 9 mL; Refill: 3 - insulin aspart (novoLOG) injection 8 Units - Basic Metabolic Panel - POCT glucose (manual entry)  2. Migraine aura, persistent, intractable Not controlled.  Stress may be playing a role.  Recommend increase Topamax to 100 mg at bedtime.  CAT scan of the head ordered.  Keep appointment with neurology next month. - topiramate (TOPAMAX) 100 MG tablet; Take 1 tablet (100 mg total) by mouth at bedtime.  Dispense: 30 tablet; Refill: 3 - CT Head Wo Contrast; Future  3. Obesity (BMI 30-39.9) See #1 above.  4. Reactive depression 5. Stressful life events affecting family and household Continue Cymbalta.  Give an appointment to meet with the LCSW for  counseling. Encouraged her to do something every day for an hour to relax.    Patient was given the opportunity to ask questions.  Patient verbalized understanding of the plan and was able to repeat key elements of the plan.   Orders Placed This Encounter  Procedures  . POCT glucose (manual entry)  . POCT glycosylated hemoglobin (Hb A1C)  . POCT URINALYSIS DIP (CLINITEK)     Requested Prescriptions    No prescriptions requested or ordered in this encounter    No follow-ups on file.  Karle Plumber, MD, FACP

## 2020-06-08 NOTE — Patient Instructions (Addendum)
Your diabetes is not controlled.  Increase Lantus insulin to 56 units at bedtime.  Increase NovoLog to 22 units with breakfast, 12 units with lunch and 22 units with dinner.  Please check your blood sugars at least twice a day before meals.  Bring in those readings in 2 weeks to see the clinical pharmacist.  Try to find at least 1 hour in the day to do relaxation techniques or something that brings you joy.  Please schedule an appointment with the licensed clinical social worker as discussed today.

## 2020-06-09 LAB — BASIC METABOLIC PANEL
BUN/Creatinine Ratio: 10 (ref 9–23)
BUN: 9 mg/dL (ref 6–24)
CO2: 24 mmol/L (ref 20–29)
Calcium: 9.4 mg/dL (ref 8.7–10.2)
Chloride: 100 mmol/L (ref 96–106)
Creatinine, Ser: 0.93 mg/dL (ref 0.57–1.00)
GFR calc Af Amer: 83 mL/min/{1.73_m2} (ref >59)
GFR calc non Af Amer: 72 mL/min/{1.73_m2} (ref >59)
Glucose: 340 mg/dL — ABNORMAL HIGH (ref 65–99)
Potassium: 4.2 mmol/L (ref 3.5–5.2)
Sodium: 138 mmol/L (ref 134–144)

## 2020-06-12 MED FILL — hydrOXYzine HCL 50 MG TABS: 50 | 30 days supply | Qty: 30 | Fill #1

## 2020-06-14 ENCOUNTER — Ambulatory Visit (HOSPITAL_COMMUNITY)
Admission: RE | Admit: 2020-06-14 | Discharge: 2020-06-14 | Disposition: A | Discharge status: Home / Self Care | Payer: Medicaid Other | Source: Ambulatory Visit | Attending: Internal Medicine | Admitting: Internal Medicine

## 2020-06-14 ENCOUNTER — Encounter (HOSPITAL_COMMUNITY): Payer: Self-pay

## 2020-06-14 ENCOUNTER — Other Ambulatory Visit: Payer: Self-pay

## 2020-06-14 DIAGNOSIS — G43519 Persistent migraine aura without cerebral infarction, intractable, without status migrainosus: Secondary | ICD-10-CM

## 2020-06-14 HISTORY — DX: Type 2 diabetes mellitus without complications: E11.9

## 2020-06-15 ENCOUNTER — Ambulatory Visit: Payer: Medicaid Other | Admitting: Neurology

## 2020-06-20 ENCOUNTER — Other Ambulatory Visit: Payer: Self-pay | Admitting: Internal Medicine

## 2020-06-20 DIAGNOSIS — M62838 Other muscle spasm: Secondary | ICD-10-CM

## 2020-06-20 DIAGNOSIS — F32A Depression, unspecified: Secondary | ICD-10-CM

## 2020-06-21 MED FILL — DULoxetine HCL 60 MG CPEP: 60 | 30 days supply | Qty: 30 | Fill #0

## 2020-06-21 MED FILL — ?ATORVASTATIN 20 MG TABLET: 20 | 30 days supply | Qty: 30 | Fill #0

## 2020-06-21 MED FILL — CYCLOBENZAPRINE 10 MG TAB: 10 | 30 days supply | Qty: 30 | Fill #0

## 2020-07-05 MED FILL — TOPIRAMATE 100 MG TABS: 100 | 30 days supply | Qty: 30 | Fill #1

## 2020-07-06 ENCOUNTER — Other Ambulatory Visit: Payer: Self-pay | Admitting: Internal Medicine

## 2020-07-06 DIAGNOSIS — E119 Type 2 diabetes mellitus without complications: Secondary | ICD-10-CM

## 2020-07-06 MED FILL — TRUE METRIX TEST STRIP: 30 days supply | Qty: 100 | Fill #0

## 2020-07-11 NOTE — Progress Notes (Deleted)
NEUROLOGY CONSULTATION NOTE  Whitney Benton MRN: [id number] DOB: 16-Dec-1971  Referring provider: Karle Plumber, MD Primary care provider: Karle Plumber, MD  Reason for consult:  headache  HISTORY OF PRESENT ILLNESS: Whitney Benton is a 49 year old ***-handed female with diabetes, depression and anxiety who presents for headaches.  History supplemented by referring provider's notes.  Onset:  *** Location:  *** Quality:  *** Intensity:  ***.  *** denies new headache, thunderclap headache or severe headache that wakes *** from sleep. Aura:  *** Premonitory Phase:  *** Postdrome:  *** Associated symptoms:  Photophobia, phonophobia, blurred vision.  She denies associated nausea, vomiting, nasal congestion, rhinorrhea, conjunctival lacrimation/injection, unilateral numbness or weakness. Duration:  *** Frequency:  *** Frequency of abortive medication: *** Triggers:  *** Exacerbating factors:  *** Relieving factors:  *** Activity:  ***  CT head without contrast on 06/14/2020 personally reviewed was unremarkable.  Prior MRI of brain with and without contrast on 11/14/2014 showed nonspecific non-enhancing small hyperintense foci primarily in the subcortical and deep white matter.  MRI of cervical spine at that time, which was also reviewed, showed spondylosis and degenerative disc disease without neural impingement.  Current NSAIDS:  *** Current analgesics:  *** Current triptans:  Sumatriptan 14m Current ergotamine:  none Current anti-emetic:  none Current muscle relaxants:  Flexeril 176mQHS Current anti-anxiolytic:  hydroxyzine Current sleep aide:  melatonin Current Antihypertensive medications:  none Current Antidepressant medications:  Cymbalta 6014maily Current Anticonvulsant medications:  topiramate 100m45m bedtime Current anti-CGRP:  none Current Vitamins/Herbal/Supplements:  Melatonin, D Current Antihistamines/Decongestants:  none Other therapy:   *** Hormone/birth control:  non  Past NSAIDS:  Ketorolac 10mg53mproxen 500mg 41m analgesics:  *** Past abortive triptans:  *** Past abortive ergotamine:  none Past muscle relaxants:  Robaxin Past anti-emetic:  Zofran ODT 8mg Pa49mantihypertensive medications:  none Past antidepressant medications:  Sertraline, trazodone Past anticonvulsant medications:  none Past anti-CGRP:  none Past vitamins/Herbal/Supplements:  none Past antihistamines/decongestants:  Zyrtec Other past therapies:  ***  Caffeine:  *** Alcohol:  *** Smoker:  *** Diet:  *** Exercise:  *** Depression:  ***; Anxiety:  *** Other pain:  *** Sleep hygiene:  *** Family history of headache:  ***   PAST MEDICAL HISTORY: Past Medical History:  Diagnosis Date  . Anxiety   . Depression   . Diabetes mellitus without complication (H[facility])   [place]     PAST SURGICAL HISTORY: Past Surgical History:  Procedure Laterality Date  . CESAREAN SECTION    . VAGINAL HYSTERECTOMY  2006   Fibroids, menorrhagia, benign pathology    MEDICATIONS: Current Outpatient Medications on File Prior to Visit  Medication Sig Dispense Refill  . atorvastatin (LIPITOR) 20 MG tablet TAKE 1 TABLET (20 MG TOTAL) BY MOUTH DAILY. 30 tablet 3  . Blood Glucose Monitoring Suppl (TRUE METRIX METER) w/Device KIT Check blood sugars three times a day 1 kit 0  . cyclobenzaprine (FLEXERIL) 10 MG tablet TAKE 1 TABLET (10 MG TOTAL) BY MOUTH AT BEDTIME. 30 tablet 1  . DULoxetine (CYMBALTA) 60 MG capsule TAKE 1 CAPSULE (60 MG TOTAL) BY MOUTH DAILY. 30 capsule 3  . ergocalciferol (VITAMIN D2) 1.25 MG (50000 UT) capsule SMARTSIG:1 Capsule(s) By Mouth Once a Week    . ergocalciferol (VITAMIN D2) 1.25 MG (50000 UT) capsule TAKE 1 CAPSULE (50,000 UNITS TOTAL) BY MOUTH EVERY 7 (SEVEN) DAYS. 4 capsule 0  . fluconazole (DIFLUCAN) 150 MG tablet TAKE 1 TABLET (150 MG  TOTAL) BY MOUTH ONCE FOR 1 DOSE. REPEAT IN 3 DAYS IF NEEDED.    Marland Kitchen glucose blood  test strip Check blood sugars three times a day 100 each 12  . hydrOXYzine (ATARAX/VISTARIL) 50 MG tablet Take 1 tablet (50 mg total) by mouth at bedtime. 30 tablet 1  . insulin aspart (NOVOLOG FLEXPEN) 100 UNIT/ML FlexPen Inject 22 units before breakfast, 12 units before lunch, and 22 units before dinner. 9 mL 3  . insulin glargine (LANTUS SOLOSTAR) 100 UNIT/ML Solostar Pen Inject 56 Units into the skin at bedtime. 15 mL 4  . Insulin Pen Needle (PEN NEEDLES) 31G X 6 MM MISC Use as directed 100 each 0  . melatonin 3 MG TABS tablet Take 1 tablet (3 mg total) by mouth at bedtime. 30 tablet 2  . meloxicam (MOBIC) 7.5 MG tablet Take 2 tablets (15 mg total) by mouth daily. 30 tablet 0  . omeprazole (PRILOSEC) 20 MG capsule TAKE 1 CAPSULE (20 MG TOTAL) BY MOUTH DAILY. 30 capsule 1  . SUMAtriptan (IMITREX) 50 MG tablet Take 1 tablet at the start of the headache.  May repeat in 2 hours if no relief.  Max 2 tabs/24 hours 10 tablet 2  . topiramate (TOPAMAX) 100 MG tablet Take 1 tablet (100 mg total) by mouth at bedtime. 30 tablet 3  . TRUEPLUS LANCETS 28G MISC Check blood sugars three time a day 100 each 12  . [DISCONTINUED] cetirizine (ZYRTEC) 10 MG tablet Take 1 tablet (10 mg total) by mouth daily. (Patient not taking: Reported on 11/02/2019) 30 tablet 1   No current facility-administered medications on file prior to visit.    ALLERGIES: Allergies  Allergen Reactions  . Aspirin Hives  . Oxycodone Nausea And Vomiting    FAMILY HISTORY: Family History  Problem Relation Age of Onset  . Diabetes Mother   . Mental illness Mother   . Depression Mother   . Hypertension Mother   . Hypertension Father   . Diabetes Father    ***.  SOCIAL HISTORY: Social History   Socioeconomic History  . Marital status: Significant Other    Spouse name: Not on file  . Number of children: 3  . Years of education: 30  . Highest education level: Not on file  Occupational History  . Not on file  Tobacco Use   . Smoking status: Current Every Day Smoker    Packs/day: 0.25    Years: 8.00    Pack years: 2.00    Types: Cigarettes  . Smokeless tobacco: Never Used  Vaping Use  . Vaping Use: Never used  Substance and Sexual Activity  . Alcohol use: Yes    Comment: occasional  . Drug use: No  . Sexual activity: Yes    Birth control/protection: Surgical  Other Topics Concern  . Not on file  Social History Narrative   Patient lives at home with mother and father    Patient has 3 children    Patient is single   Patient has 14 years of education    Patient is right handed    Social Determinants of Health   Financial Resource Strain:   . Difficulty of Paying Living Expenses:   Food Insecurity:   . Worried About Charity fundraiser in the Last Year:   . Arboriculturist in the Last Year:   Transportation Needs:   . Film/video editor (Medical):   Marland Kitchen Lack of Transportation (Non-Medical):   Physical Activity:   .  Days of Exercise per Week:   . Minutes of Exercise per Session:   Stress:   . Feeling of Stress :   Social Connections:   . Frequency of Communication with Friends and Family:   . Frequency of Social Gatherings with Friends and Family:   . Attends Religious Services:   . Active Member of Clubs or Organizations:   . Attends Archivist Meetings:   Marland Kitchen Marital Status:   Intimate Partner Violence:   . Fear of Current or Ex-Partner:   . Emotionally Abused:   Marland Kitchen Physically Abused:   . Sexually Abused:     REVIEW OF SYSTEMS: Constitutional: No fevers, chills, or sweats, no generalized fatigue, change in appetite Eyes: No visual changes, double vision, eye pain Ear, nose and throat: No hearing loss, ear pain, nasal congestion, sore throat Cardiovascular: No chest pain, palpitations Respiratory:  No shortness of breath at rest or with exertion, wheezes GastrointestinaI: No nausea, vomiting, diarrhea, abdominal pain, fecal incontinence Genitourinary:  No dysuria,  urinary retention or frequency Musculoskeletal:  No neck pain, back pain Integumentary: No rash, pruritus, skin lesions Neurological: as above Psychiatric: No depression, insomnia, anxiety Endocrine: No palpitations, fatigue, diaphoresis, mood swings, change in appetite, change in weight, increased thirst Hematologic/Lymphatic:  No purpura, petechiae. Allergic/Immunologic: no itchy/runny eyes, nasal congestion, recent allergic reactions, rashes  PHYSICAL EXAM: *** General: No acute distress.  Patient appears ***-groomed.  *** Head:  Normocephalic/atraumatic Eyes:  fundi examined but not visualized Neck: supple, no paraspinal tenderness, full range of motion Back: No paraspinal tenderness Heart: regular rate and rhythm Lungs: Clear to auscultation bilaterally. Vascular: No carotid bruits. Neurological Exam: Mental status: alert and oriented to person, place, and time, recent and remote memory intact, fund of knowledge intact, attention and concentration intact, speech fluent and not dysarthric, language intact. Cranial nerves: CN I: not tested CN II: pupils equal, round and reactive to light, visual fields intact CN III, IV, VI:  full range of motion, no nystagmus, no ptosis CN V: facial sensation intact CN VII: upper and lower face symmetric CN VIII: hearing intact CN IX, X: gag intact, uvula midline CN XI: sternocleidomastoid and trapezius muscles intact CN XII: tongue midline Bulk & Tone: normal, no fasciculations. Motor:  5/5 throughout *** Sensation:  Pinprick *** temperature *** and vibration sensation intact.  ***. Deep Tendon Reflexes:  2+ throughout, *** toes downgoing.  *** Finger to nose testing:  Without dysmetria.  *** Heel to shin:  Without dysmetria.  *** Gait:  Normal station and stride.  Able to turn and tandem walk. Romberg ***.  IMPRESSION: ***  PLAN: ***  Thank you for allowing me to take part in the care of this patient.  Metta Clines, DO  CC:  ***

## 2020-07-12 ENCOUNTER — Ambulatory Visit: Payer: Medicaid Other | Admitting: Neurology

## 2020-07-19 ENCOUNTER — Other Ambulatory Visit: Payer: Self-pay | Admitting: Internal Medicine

## 2020-07-19 MED FILL — hydrOXYzine HCL 50 MG TABS: 50 | 30 days supply | Qty: 30 | Fill #0

## 2020-07-19 MED FILL — ?DULOXetine HCL 60MG CAPS: 60 | 30 days supply | Qty: 30 | Fill #1

## 2020-07-19 MED FILL — $novoLOG FLEXPEN SYRINGE: 100 | 32 days supply | Qty: 18 | Fill #1

## 2020-07-19 MED FILL — CYCLOBENZAPRINE 10 MG TAB: 10 | 30 days supply | Qty: 30 | Fill #1

## 2020-07-19 MED FILL — ?ATORVASTATIN 20 MG TABLET: 20 | 30 days supply | Qty: 30 | Fill #1

## 2020-08-08 MED FILL — TOPIRAMATE 100 MG TABS: 100 | 30 days supply | Qty: 30 | Fill #2

## 2020-08-10 ENCOUNTER — Emergency Department (HOSPITAL_COMMUNITY): Payer: Self-pay

## 2020-08-10 ENCOUNTER — Encounter (HOSPITAL_COMMUNITY): Payer: Self-pay

## 2020-08-10 ENCOUNTER — Other Ambulatory Visit: Payer: Self-pay

## 2020-08-10 ENCOUNTER — Ambulatory Visit (HOSPITAL_COMMUNITY)
Admission: RE | Admit: 2020-08-10 | Discharge: 2020-08-10 | Disposition: A | Discharge status: Home / Self Care | Payer: Self-pay | Source: Ambulatory Visit | Attending: Urgent Care | Admitting: Urgent Care

## 2020-08-10 ENCOUNTER — Emergency Department (HOSPITAL_COMMUNITY)
Admission: EM | Admit: 2020-08-10 | Discharge: 2020-08-10 | Disposition: A | Discharge status: Home / Self Care | Payer: Self-pay | Attending: Emergency Medicine | Admitting: Emergency Medicine

## 2020-08-10 VITALS — BP 126/104 | HR 125 | Temp 98.0°F | Resp 16

## 2020-08-10 DIAGNOSIS — N390 Urinary tract infection, site not specified: Secondary | ICD-10-CM

## 2020-08-10 DIAGNOSIS — Z7982 Long term (current) use of aspirin: Secondary | ICD-10-CM | POA: Insufficient documentation

## 2020-08-10 DIAGNOSIS — F17210 Nicotine dependence, cigarettes, uncomplicated: Secondary | ICD-10-CM | POA: Insufficient documentation

## 2020-08-10 DIAGNOSIS — Z20822 Contact with and (suspected) exposure to covid-19: Secondary | ICD-10-CM | POA: Insufficient documentation

## 2020-08-10 DIAGNOSIS — R140 Abdominal distension (gaseous): Secondary | ICD-10-CM | POA: Insufficient documentation

## 2020-08-10 DIAGNOSIS — R739 Hyperglycemia, unspecified: Secondary | ICD-10-CM | POA: Insufficient documentation

## 2020-08-10 DIAGNOSIS — R1013 Epigastric pain: Secondary | ICD-10-CM

## 2020-08-10 DIAGNOSIS — E860 Dehydration: Secondary | ICD-10-CM | POA: Insufficient documentation

## 2020-08-10 DIAGNOSIS — R1084 Generalized abdominal pain: Secondary | ICD-10-CM

## 2020-08-10 DIAGNOSIS — R519 Headache, unspecified: Secondary | ICD-10-CM | POA: Insufficient documentation

## 2020-08-10 DIAGNOSIS — R109 Unspecified abdominal pain: Secondary | ICD-10-CM

## 2020-08-10 DIAGNOSIS — Z794 Long term (current) use of insulin: Secondary | ICD-10-CM | POA: Insufficient documentation

## 2020-08-10 DIAGNOSIS — R000 Tachycardia, unspecified: Secondary | ICD-10-CM

## 2020-08-10 DIAGNOSIS — E1165 Type 2 diabetes mellitus with hyperglycemia: Secondary | ICD-10-CM

## 2020-08-10 DIAGNOSIS — N3001 Acute cystitis with hematuria: Secondary | ICD-10-CM | POA: Insufficient documentation

## 2020-08-10 LAB — CBC WITH DIFFERENTIAL/PLATELET
Abs Immature Granulocytes: 0.05 10*3/uL (ref 0.00–0.07)
Basophils Absolute: 0 10*3/uL (ref 0.0–0.1)
Basophils Relative: 0 %
Eosinophils Absolute: 0.2 10*3/uL (ref 0.0–0.5)
Eosinophils Relative: 2 %
HCT: 44.1 % (ref 36.0–46.0)
Hemoglobin: 14.8 g/dL (ref 12.0–15.0)
Immature Granulocytes: 1 %
Lymphocytes Relative: 37 %
Lymphs Abs: 3.7 10*3/uL (ref 0.7–4.0)
MCH: 28.1 pg (ref 26.0–34.0)
MCHC: 33.6 g/dL (ref 30.0–36.0)
MCV: 83.7 fL (ref 80.0–100.0)
Monocytes Absolute: 0.7 10*3/uL (ref 0.1–1.0)
Monocytes Relative: 7 %
Neutro Abs: 5.3 10*3/uL (ref 1.7–7.7)
Neutrophils Relative %: 53 %
Platelets: 419 10*3/uL — ABNORMAL HIGH (ref 150–400)
RBC: 5.27 MIL/uL — ABNORMAL HIGH (ref 3.87–5.11)
RDW: 13.2 % (ref 11.5–15.5)
WBC: 9.8 10*3/uL (ref 4.0–10.5)
nRBC: 0 % (ref 0.0–0.2)

## 2020-08-10 LAB — LIPASE, BLOOD: Lipase: 25 U/L (ref 11–51)

## 2020-08-10 LAB — URINALYSIS, ROUTINE W REFLEX MICROSCOPIC
Bilirubin Urine: NEGATIVE
Glucose, UA: >=500 mg/dL — AB
Ketones, ur: NEGATIVE mg/dL
Nitrite: NEGATIVE
Protein, ur: NEGATIVE mg/dL
Specific Gravity, Urine: <1.005 — ABNORMAL LOW (ref 1.005–1.030)
pH: 5 (ref 5.0–8.0)

## 2020-08-10 LAB — BLOOD GAS, VENOUS
Acid-Base Excess: 2.3 mmol/L — ABNORMAL HIGH (ref 0.0–2.0)
Bicarbonate: 26.5 mmol/L (ref 20.0–28.0)
O2 Saturation: 92.6 %
Patient temperature: 98.6
pCO2, Ven: 41.3 mmHg — ABNORMAL LOW (ref 44.0–60.0)
pH, Ven: 7.423 (ref 7.250–7.430)
pO2, Ven: 70.1 mmHg — ABNORMAL HIGH (ref 32.0–45.0)

## 2020-08-10 LAB — I-STAT CHEM 8, ED
BUN: 19 mg/dL (ref 6–20)
Calcium, Ion: 1.06 mmol/L — ABNORMAL LOW (ref 1.15–1.40)
Chloride: 102 mmol/L (ref 98–111)
Creatinine, Ser: 0.8 mg/dL (ref 0.44–1.00)
Glucose, Bld: 242 mg/dL — ABNORMAL HIGH (ref 70–99)
HCT: 45 % (ref 36.0–46.0)
Hemoglobin: 15.3 g/dL — ABNORMAL HIGH (ref 12.0–15.0)
Potassium: 4.4 mmol/L (ref 3.5–5.1)
Sodium: 140 mmol/L (ref 135–145)
TCO2: 27 mmol/L (ref 22–32)

## 2020-08-10 LAB — SARS CORONAVIRUS 2 BY RT PCR (HOSPITAL ORDER, PERFORMED IN ~~LOC~~ HOSPITAL LAB): SARS Coronavirus 2: NEGATIVE

## 2020-08-10 LAB — COMPREHENSIVE METABOLIC PANEL
ALT: 17 U/L (ref 0–44)
AST: 31 U/L (ref 15–41)
Albumin: 4.6 g/dL (ref 3.5–5.0)
Alkaline Phosphatase: 163 U/L — ABNORMAL HIGH (ref 38–126)
Anion gap: 11 (ref 5–15)
BUN: 16 mg/dL (ref 6–20)
CO2: 25 mmol/L (ref 22–32)
Calcium: 9.8 mg/dL (ref 8.9–10.3)
Chloride: 101 mmol/L (ref 98–111)
Creatinine, Ser: 0.89 mg/dL (ref 0.44–1.00)
GFR calc Af Amer: >60 mL/min (ref >60)
GFR calc non Af Amer: >60 mL/min (ref >60)
Glucose, Bld: 238 mg/dL — ABNORMAL HIGH (ref 70–99)
Potassium: 4.7 mmol/L (ref 3.5–5.1)
Sodium: 137 mmol/L (ref 135–145)
Total Bilirubin: 0.4 mg/dL (ref 0.3–1.2)
Total Protein: 8.6 g/dL — ABNORMAL HIGH (ref 6.5–8.1)

## 2020-08-10 LAB — BETA-HYDROXYBUTYRIC ACID: Beta-Hydroxybutyric Acid: 0.1 mmol/L (ref 0.05–0.27)

## 2020-08-10 LAB — CBG MONITORING, ED: Glucose-Capillary: 340 mg/dL — ABNORMAL HIGH (ref 70–99)

## 2020-08-10 MED ORDER — CEPHALEXIN 500 MG PO CAPS: 500.0000 mg | ORAL_CAPSULE | Freq: Three times a day (TID) | ORAL | 0 refills | Status: AC

## 2020-08-10 MED ORDER — ONDANSETRON 4 MG PO TBDP: 4.0000 mg | ORAL_TABLET | Freq: Three times a day (TID) | ORAL | 0 refills | Status: DC | PRN

## 2020-08-10 MED ORDER — IOHEXOL 350 MG/ML SOLN
100.0000 mL | Freq: Once | INTRAVENOUS | Status: AC | PRN
  Administered 2020-08-10: 100 mL via INTRAVENOUS

## 2020-08-10 MED ORDER — PANTOPRAZOLE SODIUM 20 MG PO TBEC: 40.0000 mg | DELAYED_RELEASE_TABLET | Freq: Every day | ORAL | 0 refills | Status: DC

## 2020-08-10 MED ORDER — SODIUM CHLORIDE 0.9 % IV BOLUS
500.0000 mL | Freq: Once | INTRAVENOUS | Status: AC
  Administered 2020-08-10: 500 mL via INTRAVENOUS

## 2020-08-10 MED ORDER — DICYCLOMINE HCL 20 MG PO TABS: 20.0000 mg | ORAL_TABLET | Freq: Two times a day (BID) | ORAL | 0 refills | Status: DC | PRN

## 2020-08-10 MED ORDER — SODIUM CHLORIDE 0.9 % IV BOLUS: 1000.0000 mL | Freq: Once | INTRAVENOUS | Status: DC

## 2020-08-10 NOTE — ED Provider Notes (Signed)
Whitney Benton   MRN: [id number] DOB: Feb 12, 1971  Subjective:   Whitney Benton is a 49 y.o. female presenting for 1 week hx of abdominal bloating, weight gain, intermittent shortness of breath. Has a hx of uncontrolled diabetes II and is on insulin. Has a hx of an arrhythmia many "years" ago but cannot recall what it was. Denies fever, h/a, confusion, n/v, diarrhea, abd pain.   No current facility-administered medications for this encounter.  Current Outpatient Medications:  .  atorvastatin (LIPITOR) 20 MG tablet, TAKE 1 TABLET (20 MG TOTAL) BY MOUTH DAILY., Disp: 30 tablet, Rfl: 3 .  Blood Glucose Monitoring Suppl (TRUE METRIX METER) w/Device KIT, Check blood sugars three times a day, Disp: 1 kit, Rfl: 0 .  cyclobenzaprine (FLEXERIL) 10 MG tablet, TAKE 1 TABLET (10 MG TOTAL) BY MOUTH AT BEDTIME., Disp: 30 tablet, Rfl: 1 .  DULoxetine (CYMBALTA) 60 MG capsule, TAKE 1 CAPSULE (60 MG TOTAL) BY MOUTH DAILY., Disp: 30 capsule, Rfl: 3 .  glucose blood test strip, Check blood sugars three times a day, Disp: 100 each, Rfl: 12 .  hydrOXYzine (ATARAX/VISTARIL) 50 MG tablet, TAKE 1 TABLET (50 MG TOTAL) BY MOUTH AT BEDTIME., Disp: 90 tablet, Rfl: 0 .  insulin aspart (NOVOLOG FLEXPEN) 100 UNIT/ML FlexPen, Inject 22 units before breakfast, 12 units before lunch, and 22 units before dinner., Disp: 9 mL, Rfl: 3 .  insulin glargine (LANTUS SOLOSTAR) 100 UNIT/ML Solostar Pen, Inject 56 Units into the skin at bedtime., Disp: 15 mL, Rfl: 4 .  Insulin Pen Needle (PEN NEEDLES) 31G X 6 MM MISC, Use as directed, Disp: 100 each, Rfl: 0 .  melatonin 3 MG TABS tablet, Take 1 tablet (3 mg total) by mouth at bedtime., Disp: 30 tablet, Rfl: 2 .  meloxicam (MOBIC) 7.5 MG tablet, Take 2 tablets (15 mg total) by mouth daily., Disp: 30 tablet, Rfl: 0 .  omeprazole (PRILOSEC) 20 MG capsule, TAKE 1 CAPSULE (20 MG TOTAL) BY MOUTH DAILY., Disp: 30 capsule, Rfl: 1 .  SUMAtriptan (IMITREX) 50 MG tablet, Take 1 tablet at the  start of the headache.  May repeat in 2 hours if no relief.  Max 2 tabs/24 hours, Disp: 10 tablet, Rfl: 2 .  topiramate (TOPAMAX) 100 MG tablet, Take 1 tablet (100 mg total) by mouth at bedtime., Disp: 30 tablet, Rfl: 3 .  TRUEPLUS LANCETS 28G MISC, Check blood sugars three time a day, Disp: 100 each, Rfl: 12   Allergies  Allergen Reactions  . Aspirin Hives  . Oxycodone Nausea And Vomiting    Past Medical History:  Diagnosis Date  . Anxiety   . Depression   . Diabetes mellitus without complication ([place])   . Trichomonas infection      Past Surgical History:  Procedure Laterality Date  . CESAREAN SECTION    . VAGINAL HYSTERECTOMY  2006   Fibroids, menorrhagia, benign pathology    Family History  Problem Relation Age of Onset  . Diabetes Mother   . Mental illness Mother   . Depression Mother   . Hypertension Mother   . Hypertension Father   . Diabetes Father     Social History   Tobacco Use  . Smoking status: Current Every Day Smoker    Packs/day: 0.25    Years: 8.00    Pack years: 2.00    Types: Cigarettes  . Smokeless tobacco: Never Used  Vaping Use  . Vaping Use: Never used  Substance Use Topics  . Alcohol use: Yes  Comment: occasional  . Drug use: No    ROS   Objective:   Vitals: BP (!) 126/104   Pulse (!) 125   Temp 98 F (36.7 C)   Resp 16   SpO2 95%   Wt Readings from Last 3 Encounters:  06/08/20 189 lb 6.4 oz (85.9 kg)  04/03/20 189 lb (85.7 kg)  03/03/20 175 lb (79.4 kg)   Wt Readings from Last 3 Encounters:  06/08/20 189 lb 6.4 oz (85.9 kg)  04/03/20 189 lb (85.7 kg)  03/03/20 175 lb (79.4 kg)   Temp Readings from Last 3 Encounters:  08/10/20 98 F (36.7 C)  06/08/20 97.9 F (36.6 C)  04/03/20 98.1 F (36.7 C)   BP Readings from Last 3 Encounters:  08/10/20 (!) 126/104  06/08/20 (!) 122/59  04/03/20 122/89   Pulse Readings from Last 3 Encounters:  08/10/20 (!) 125  06/08/20 (!) 105  04/03/20 (!) 105   Weight 207lbs  today.   Physical Exam Constitutional:      General: She is not in acute distress.    Appearance: Normal appearance. She is well-developed and normal weight. She is diaphoretic. She is not ill-appearing or toxic-appearing.  HENT:     Head: Normocephalic and atraumatic.     Right Ear: External ear normal.     Left Ear: External ear normal.     Nose: Nose normal.     Mouth/Throat:     Mouth: Mucous membranes are moist.     Pharynx: Oropharynx is clear.  Eyes:     General: No scleral icterus.    Extraocular Movements: Extraocular movements intact.     Pupils: Pupils are equal, round, and reactive to light.  Cardiovascular:     Rate and Rhythm: Regular rhythm. Tachycardia present.     Pulses: Normal pulses.     Heart sounds: Normal heart sounds. No murmur heard.  No friction rub. No gallop.   Pulmonary:     Effort: Pulmonary effort is normal. No respiratory distress.     Breath sounds: Normal breath sounds. No stridor. No wheezing, rhonchi or rales.  Abdominal:     General: Bowel sounds are normal. There is distension.     Palpations: Abdomen is soft. There is no mass.     Tenderness: There is generalized abdominal tenderness. There is no right CVA tenderness, left CVA tenderness, guarding or rebound.  Skin:    General: Skin is warm.     Coloration: Skin is not pale.     Findings: No rash.  Neurological:     General: No focal deficit present.     Mental Status: She is alert and oriented to person, place, and time.  Psychiatric:        Mood and Affect: Mood normal.        Behavior: Behavior normal.        Thought Content: Thought content normal.        Judgment: Judgment normal.     ED ECG REPORT   Date: 08/10/2020  Rate: 127bpm  Rhythm: sinus tachycardia  QRS Axis: left  Intervals: normal  ST/T Wave abnormalities: nonspecific T wave changes  Conduction Disutrbances:none  Narrative Interpretation: Sinus tachycardia at 127bpm, with nonspecific Twave flattening in leads  I, aVL, aVF, left axis deviation. Sinus tachycardia has previously been present.   Old EKG Reviewed: unchanged  I have personally reviewed the EKG tracing and agree with the computerized printout as noted.  Results for orders placed or performed during  the hospital encounter of 08/10/20 (from the past 24 hour(s))  POC CBG monitoring     Status: Abnormal   Collection Time: 08/10/20  6:52 PM  Result Value Ref Range   Glucose-Capillary 340 (H) 70 - 99 mg/dL   Comment 1 Notify RN      Assessment and Plan :   PDMP not reviewed this encounter.  1. Generalized abdominal pain   2. Abdominal bloating   3. Tachycardia   4. Uncontrolled type 2 diabetes mellitus with hyperglycemia ([place])   5. Hyperglycemia     Patient is tachycardic, severely distended abdominally with pain on palpation. Has hyperglycemia in clinic. She became diaphoretic while I started my visit with her. EKG confirms tachycardia but no acute ACS. Needs further evaluation in emergency room, EMS arrived and will transport her there now.    Jaynee Eagles, [place] 08/10/20 1915

## 2020-08-10 NOTE — ED Notes (Signed)
Patient is being discharged from the Urgent Care and sent to the Emergency Department via EMS . Per Jaynee Eagles, PA, patient is in need of higher level of care due to elevated heart rate and abdominal distention. Patient is aware and verbalizes understanding of plan of care.  Vitals:   08/10/20 1832 08/10/20 1833  BP:  (!) 126/104  Pulse:  (!) 125  Resp: 16   Temp: 98 F (36.7 C)   SpO2: 95%

## 2020-08-10 NOTE — Discharge Instructions (Signed)
Please report to the emergency room now as you have severe abdominal pain, abdominal distension and tachycardia and this needs further evaluation and stabilization in the hospital.

## 2020-08-10 NOTE — ED Provider Notes (Addendum)
[facility] DEPT Provider Note   CSN: [id number] Arrival date & time: 08/10/20  1942     History Chief Complaint  Patient presents with  . Abdominal Pain    Whitney Benton is a 49 y.o. female.  HPI      49 year old female with history of diabetes presents with concern for abdominal distention increasing over the last 3 weeks, nausea, shortness of breath, and tachycardia from urgent care.  Presents from UC Started 3wk ago with abdominal distention but over the last 3 days abdomen has expanded more Migraine headaches for about 3 weeks, has been trying to not come to the hospital If eat, has nausea, low appetite Abdominal pain in epigastrium and periumbilical area No vomiting No diarrhea or constipation, no black or bloody stools No fevers Menopause so having hot flashes/cold sweats No leg pain or swelling Shortness of breath has been worsening because of abdominal distention No chest pain or cough No urinary symptoms  Left foot back of heel bone spur pain for one month    Past Medical History:  Diagnosis Date  . Anxiety   . Depression   . Diabetes mellitus without complication ([place])   . Trichomonas infection     Patient Active Problem List   Diagnosis Date Noted  . Glaucoma suspect 04/12/2020  . DKA (diabetic ketoacidoses) ([place]) 10/02/2019  . AKI (acute kidney injury) ([place]) 10/02/2019  . Hyperkalemia 10/02/2019  . Leukocytosis 10/02/2019  . Abnormal LFTs 10/02/2019  . Stressful life events affecting family and household 08/06/2019  . New onset type 2 diabetes mellitus ([place]) 02/04/2019  . Obesity (BMI 30-39.9) 02/04/2019  . Tobacco dependence 02/04/2019  . Left arm weakness 12/06/2014  . Paresthesias/numbness 12/06/2014  . Tobacco abuse 11/14/2014  . Depression   . Mixed incontinence 06/27/2014  . History of TVH in 2006 for fibroids and menorrhagia; benign pathology 09/08/2011    Past Surgical History:  Procedure Laterality  Date  . CESAREAN SECTION    . VAGINAL HYSTERECTOMY  2006   Fibroids, menorrhagia, benign pathology     OB History    Gravida  5   Para  3   Term  3   Preterm      AB  2   Living  3     SAB  2   TAB  0   Ectopic  0   Multiple  0   Live Births              Family History  Problem Relation Age of Onset  . Diabetes Mother   . Mental illness Mother   . Depression Mother   . Hypertension Mother   . Hypertension Father   . Diabetes Father     Social History   Tobacco Use  . Smoking status: Current Every Day Smoker    Packs/day: 0.25    Years: 8.00    Pack years: 2.00    Types: Cigarettes  . Smokeless tobacco: Never Used  Vaping Use  . Vaping Use: Never used  Substance Use Topics  . Alcohol use: Yes    Comment: occasional  . Drug use: No    Home Medications Prior to Admission medications   Medication Sig Start Date End Date Taking? Authorizing Provider  atorvastatin (LIPITOR) 20 MG tablet TAKE 1 TABLET (20 MG TOTAL) BY MOUTH DAILY. 06/20/20  Yes Ladell Pier, MD  cyclobenzaprine (FLEXERIL) 10 MG tablet TAKE 1 TABLET (10 MG TOTAL) BY MOUTH AT BEDTIME. 06/20/20  Yes Ladell Pier, MD  DULoxetine (CYMBALTA) 60 MG capsule TAKE 1 CAPSULE (60 MG TOTAL) BY MOUTH DAILY. 06/20/20  Yes Ladell Pier, MD  hydrOXYzine (ATARAX/VISTARIL) 50 MG tablet TAKE 1 TABLET (50 MG TOTAL) BY MOUTH AT BEDTIME. 07/19/20  Yes Ladell Pier, MD  insulin aspart (NOVOLOG FLEXPEN) 100 UNIT/ML FlexPen Inject 22 units before breakfast, 12 units before lunch, and 22 units before dinner. 06/08/20  Yes Ladell Pier, MD  insulin glargine (LANTUS SOLOSTAR) 100 UNIT/ML Solostar Pen Inject 56 Units into the skin at bedtime. Patient taking differently: Inject 52 Units into the skin at bedtime.  06/08/20  Yes Ladell Pier, MD  melatonin 3 MG TABS tablet Take 1 tablet (3 mg total) by mouth at bedtime. 05/03/20  Yes Ladell Pier, MD  Multiple Vitamin (MULTIVITAMIN  ADULT) TABS Take 1 tablet by mouth daily.   Yes [provider]  omeprazole (PRILOSEC) 20 MG capsule TAKE 1 CAPSULE (20 MG TOTAL) BY MOUTH DAILY. 12/20/19  Yes Ladell Pier, MD  SUMAtriptan (IMITREX) 50 MG tablet Take 1 tablet at the start of the headache.  May repeat in 2 hours if no relief.  Max 2 tabs/24 hours Patient taking differently: Take 50 mg by mouth every 2 (two) hours as needed for migraine or headache. Take 1 tablet at the start of the headache.  May repeat in 2 hours if no relief.  Max 2 tabs/24 hours 04/18/20  Yes Ladell Pier, MD  topiramate (TOPAMAX) 100 MG tablet Take 1 tablet (100 mg total) by mouth at bedtime. 06/08/20  Yes Ladell Pier, MD  vitamin B-12 (CYANOCOBALAMIN) 100 MCG tablet Take 100 mcg by mouth daily.   Yes [provider]  Blood Glucose Monitoring Suppl (TRUE METRIX METER) w/Device KIT Check blood sugars three times a day 02/04/19   Ladell Pier, MD  cephALEXin (KEFLEX) 500 MG capsule Take 1 capsule (500 mg total) by mouth 3 (three) times daily for 7 days. 08/10/20 08/17/20  Gareth Morgan, MD  dicyclomine (BENTYL) 20 MG tablet Take 1 tablet (20 mg total) by mouth 2 (two) times daily as needed for spasms (abdominal pain). 08/10/20   Gareth Morgan, MD  glucose blood test strip Check blood sugars three times a day 07/06/20   Ladell Pier, MD  Insulin Pen Needle (PEN NEEDLES) 31G X 6 MM MISC Use as directed 02/26/19   Ladell Pier, MD  ondansetron (ZOFRAN ODT) 4 MG disintegrating tablet Take 1 tablet (4 mg total) by mouth every 8 (eight) hours as needed for nausea or vomiting. 08/10/20   Gareth Morgan, MD  pantoprazole (PROTONIX) 20 MG tablet Take 2 tablets (40 mg total) by mouth daily for 14 days. 08/10/20 08/24/20  Gareth Morgan, MD  TRUEPLUS LANCETS 28G MISC Check blood sugars three time a day 02/04/19   Ladell Pier, MD  cetirizine (ZYRTEC) 10 MG tablet Take 1 tablet (10 mg total) by mouth daily. Patient not  taking: Reported on 11/02/2019 03/31/19 03/03/20  Charlott Rakes, MD    Allergies    Aspirin and Oxycodone  Review of Systems   Review of Systems  Constitutional: Positive for appetite change. Negative for fever.  HENT: Negative for sore throat.   Eyes: Negative for visual disturbance.  Respiratory: Positive for shortness of breath. Negative for cough.   Cardiovascular: Negative for chest pain and leg swelling.  Gastrointestinal: Positive for abdominal distention, abdominal pain (pain across the epigastrium) and nausea. Negative for constipation,  diarrhea and vomiting.  Endocrine: Positive for polydipsia.  Genitourinary: Negative for difficulty urinating.  Musculoskeletal: Negative for back pain and neck pain.  Skin: Negative for rash.  Neurological: Positive for headaches. Negative for syncope.    Physical Exam Updated Vital Signs BP 116/80   Pulse 89   Temp 98.8 F (37.1 C) (Oral)   Resp 19   SpO2 95%   Physical Exam Vitals and nursing note reviewed.  Constitutional:      General: She is not in acute distress.    Appearance: She is well-developed. She is not diaphoretic.  HENT:     Head: Normocephalic and atraumatic.  Eyes:     Conjunctiva/sclera: Conjunctivae normal.  Cardiovascular:     Rate and Rhythm: Regular rhythm. Tachycardia present.     Heart sounds: Normal heart sounds. No murmur heard.  No friction rub. No gallop.   Pulmonary:     Effort: Pulmonary effort is normal. No respiratory distress.     Breath sounds: Normal breath sounds. No wheezing or rales.  Abdominal:     General: There is distension.     Palpations: Abdomen is soft.     Tenderness: There is generalized abdominal tenderness. There is no guarding.  Musculoskeletal:        General: No tenderness.     Cervical back: Normal range of motion.  Skin:    General: Skin is warm and dry.     Findings: No erythema or rash.  Neurological:     Mental Status: She is alert and oriented to person,  place, and time.     ED Results / Procedures / Treatments   Labs (all labs ordered are listed, but only abnormal results are displayed) Labs Reviewed  CBC WITH DIFFERENTIAL/PLATELET - Abnormal; Notable for the following components:      Result Value   RBC 5.27 (*)    Platelets 419 (*)    All other components within normal limits  COMPREHENSIVE METABOLIC PANEL - Abnormal; Notable for the following components:   Glucose, Bld 238 (*)    Total Protein 8.6 (*)    Alkaline Phosphatase 163 (*)    All other components within normal limits  BLOOD GAS, VENOUS - Abnormal; Notable for the following components:   pCO2, Ven 41.3 (*)    pO2, Ven 70.1 (*)    Acid-Base Excess 2.3 (*)    All other components within normal limits  URINALYSIS, ROUTINE W REFLEX MICROSCOPIC - Abnormal; Notable for the following components:   Specific Gravity, Urine <1.005 (*)    Glucose, UA >=500 (*)    Hgb urine dipstick SMALL (*)    Leukocytes,Ua LARGE (*)    Bacteria, UA RARE (*)    All other components within normal limits  I-STAT CHEM 8, ED - Abnormal; Notable for the following components:   Glucose, Bld 242 (*)    Calcium, Ion 1.06 (*)    Hemoglobin 15.3 (*)    All other components within normal limits  SARS CORONAVIRUS 2 BY RT PCR (HOSPITAL ORDER, [facility])  URINE CULTURE  BETA-HYDROXYBUTYRIC ACID  LIPASE, BLOOD    EKG EKG Interpretation  Date/Time:  Thursday August 10 2020 20:03:11 EDT Ventricular Rate:  113 PR Interval:    QRS Duration: 94 QT Interval:  272 QTC Calculation: 373 R Axis:   -19 Text Interpretation: Sinus tachycardia Probable left atrial enlargement Borderline left axis deviation Borderline T wave abnormalities No significant change since last tracing Confirmed by Billy Fischer,  Junie Panning [phone]) on 08/10/2020 8:37:57 PM   Radiology CT Angio Chest PE W and/or Wo Contrast  Result Date: 08/10/2020 CLINICAL DATA:  Abdominal pain, 20 pound weight gain,  hyperglycemia EXAM: CT ANGIOGRAPHY CHEST CT ABDOMEN AND PELVIS WITH CONTRAST TECHNIQUE: Multidetector CT imaging of the chest was performed using the standard protocol during bolus administration of intravenous contrast. Multiplanar CT image reconstructions and MIPs were obtained to evaluate the vascular anatomy. Multidetector CT imaging of the abdomen and pelvis was performed using the standard protocol during bolus administration of intravenous contrast. CONTRAST:  162m OMNIPAQUE IOHEXOL 350 MG/ML SOLN COMPARISON:  08/10/2020, 02/26/2019 FINDINGS: CTA CHEST FINDINGS Cardiovascular: This is a technically adequate evaluation of the pulmonary vasculature. There are no filling defects or pulmonary emboli. The heart is unremarkable without pericardial effusion. Normal caliber of the thoracic aorta. Mediastinum/Nodes: No enlarged mediastinal, hilar, or axillary lymph nodes. Thyroid gland, trachea, and esophagus demonstrate no significant findings. Lungs/Pleura: No airspace disease, effusion, or pneumothorax. The central airways are patent. Musculoskeletal: No acute or destructive bony lesions. Reconstructed images demonstrate no additional findings. Review of the MIP images confirms the above findings. CT ABDOMEN and PELVIS FINDINGS Hepatobiliary: No focal liver abnormality is seen. No gallstones, gallbladder wall thickening, or biliary dilatation. Pancreas: Unremarkable. No pancreatic ductal dilatation or surrounding inflammatory changes. Spleen: Normal in size without focal abnormality. Adrenals/Urinary Tract: Adrenal glands are unremarkable. Kidneys are normal, without renal calculi, focal lesion, or hydronephrosis. Bladder is unremarkable. Stomach/Bowel: No bowel obstruction or ileus. Normal appendix right lower quadrant. No bowel wall thickening or inflammatory change. Vascular/Lymphatic: No significant vascular findings are present. No enlarged abdominal or pelvic lymph nodes. Reproductive: Status post  hysterectomy. No adnexal masses. Other: No free fluid or free gas. No abdominal wall hernia. Musculoskeletal: No acute or destructive bony lesions. Reconstructed images demonstrate no additional findings. Review of the MIP images confirms the above findings. IMPRESSION: 1. Normal CTA of the chest. No evidence of pulmonary emboli. 2. No acute intra-abdominal or intrapelvic process. Electronically Signed   By: MRanda NgoM.D.   On: 08/10/2020 21:40   CT ABDOMEN PELVIS W CONTRAST  Result Date: 08/10/2020 CLINICAL DATA:  Abdominal pain, 20 pound weight gain, hyperglycemia EXAM: CT ANGIOGRAPHY CHEST CT ABDOMEN AND PELVIS WITH CONTRAST TECHNIQUE: Multidetector CT imaging of the chest was performed using the standard protocol during bolus administration of intravenous contrast. Multiplanar CT image reconstructions and MIPs were obtained to evaluate the vascular anatomy. Multidetector CT imaging of the abdomen and pelvis was performed using the standard protocol during bolus administration of intravenous contrast. CONTRAST:  1039mOMNIPAQUE IOHEXOL 350 MG/ML SOLN COMPARISON:  08/10/2020, 02/26/2019 FINDINGS: CTA CHEST FINDINGS Cardiovascular: This is a technically adequate evaluation of the pulmonary vasculature. There are no filling defects or pulmonary emboli. The heart is unremarkable without pericardial effusion. Normal caliber of the thoracic aorta. Mediastinum/Nodes: No enlarged mediastinal, hilar, or axillary lymph nodes. Thyroid gland, trachea, and esophagus demonstrate no significant findings. Lungs/Pleura: No airspace disease, effusion, or pneumothorax. The central airways are patent. Musculoskeletal: No acute or destructive bony lesions. Reconstructed images demonstrate no additional findings. Review of the MIP images confirms the above findings. CT ABDOMEN and PELVIS FINDINGS Hepatobiliary: No focal liver abnormality is seen. No gallstones, gallbladder wall thickening, or biliary dilatation. Pancreas:  Unremarkable. No pancreatic ductal dilatation or surrounding inflammatory changes. Spleen: Normal in size without focal abnormality. Adrenals/Urinary Tract: Adrenal glands are unremarkable. Kidneys are normal, without renal calculi, focal lesion, or hydronephrosis. Bladder is unremarkable. Stomach/Bowel: No bowel obstruction  or ileus. Normal appendix right lower quadrant. No bowel wall thickening or inflammatory change. Vascular/Lymphatic: No significant vascular findings are present. No enlarged abdominal or pelvic lymph nodes. Reproductive: Status post hysterectomy. No adnexal masses. Other: No free fluid or free gas. No abdominal wall hernia. Musculoskeletal: No acute or destructive bony lesions. Reconstructed images demonstrate no additional findings. Review of the MIP images confirms the above findings. IMPRESSION: 1. Normal CTA of the chest. No evidence of pulmonary emboli. 2. No acute intra-abdominal or intrapelvic process. Electronically Signed   By: Randa Ngo M.D.   On: 08/10/2020 21:40   DG Chest Portable 1 View  Result Date: 08/10/2020 CLINICAL DATA:  Abdominal pain. EXAM: PORTABLE CHEST 1 VIEW COMPARISON:  October 02, 2019 FINDINGS: There is no evidence of acute infiltrate, pleural effusion or pneumothorax. The heart size and mediastinal contours are within normal limits. The visualized skeletal structures are unremarkable. IMPRESSION: No active disease. Electronically Signed   By: Virgina Norfolk M.D.   On: 08/10/2020 20:13    Procedures Procedures (including critical care time)  Medications Ordered in ED Medications  sodium chloride 0.9 % bolus 500 mL (0 mLs Intravenous Stopped 08/10/20 2308)  iohexol (OMNIPAQUE) 350 MG/ML injection 100 mL (100 mLs Intravenous Contrast Given 08/10/20 2115)    ED Course  I have reviewed the triage vital signs and the nursing notes.  Pertinent labs & imaging results that were available during my care of the patient were reviewed by me and  considered in my medical decision making (see chart for details).    MDM Rules/Calculators/A&P                          49 year old female with history of diabetes presents with concern for abdominal distention increasing over the last 3 weeks, nausea, shortness of breath, and tachycardia from urgent care.  CT PE study done given tachycardia, dyspnea shows no acute findings of lung, no evidence of PE.  CT abdomen pelvis done give pain, distention for eval for obstruction shows no acute intraabdominal or intrapelvic process.  Labs show hyperglycemia 238 and no sign of DKA.  UA with WBC consistent with UTI.  HR improved with fluids, suspect secondary to dehydration from decreased po intake.   Given rx for keflex for UTI, PPI for possible gastritis as etiology of pain, bentyl for other abdominal cramping and zofran. Recommend podiatry follow up for bone spur on heel of left foot and PCP follow up for continued evaluation of abdominal pain, hyperglycemia. Patient discharged in stable condition with understanding of reasons to return.    Final Clinical Impression(s) / ED Diagnoses Final diagnoses:  Tachycardia  Abdominal distention  Dehydration  Urinary tract infection with hematuria, site unspecified  Epigastric pain  Hyperglycemia    Rx / DC Orders ED Discharge Orders         Ordered    cephALEXin (KEFLEX) 500 MG capsule  3 times daily        08/10/20 2324    dicyclomine (BENTYL) 20 MG tablet  2 times daily PRN        08/10/20 2324    ondansetron (ZOFRAN ODT) 4 MG disintegrating tablet  Every 8 hours PRN        08/10/20 2324    pantoprazole (PROTONIX) 20 MG tablet  Daily        08/10/20 2324            Gareth Morgan, MD 08/11/20 0036

## 2020-08-10 NOTE — ED Triage Notes (Signed)
Per EMS,  Pt coming from urgent care. Pt complaining of abd pain, and 20lb weight gain over the last week and a half. Pts blood sugar 340 today, HBGA1C was reported at 15.

## 2020-08-10 NOTE — ED Notes (Signed)
EMS on site to pick up patient

## 2020-08-10 NOTE — ED Triage Notes (Signed)
Pt c/o abdominal swelling x 1 week, pt states she thinks she has gained 30 lbs in a week. Pt c/o nausea.  Also c/o left heel pain, after falling off step "awhile ago"

## 2020-08-11 MED FILL — DICYCLOMINE 20 MG TABLET: 20 | 10 days supply | Qty: 20 | Fill #0

## 2020-08-11 MED FILL — PANTOPRAZOLE SOD DR 20 MG T: 20 | 14 days supply | Qty: 28 | Fill #0

## 2020-08-11 MED FILL — CEPHALEXIN 500 MG CAPSULE: 500 | 7 days supply | Qty: 21 | Fill #0

## 2020-08-11 MED FILL — ONDANSETRON ODT 4 MG TABLET: 4 | 6 days supply | Qty: 20 | Fill #0

## 2020-08-12 LAB — URINE CULTURE

## 2020-08-14 ENCOUNTER — Telehealth: Payer: Self-pay | Admitting: Internal Medicine

## 2020-08-14 NOTE — Telephone Encounter (Signed)
Whitney Benton is there a sooner appt with any provider

## 2020-08-14 NOTE — Telephone Encounter (Signed)
pls fu with Pt states she was supposed to have an appt with Dr Wynetta Emery for the swelling in her stomach, she has since been to ED. She states that she needs an appt with Dr Wynetta Emery sooner than [facility] can offer and that Dr Wynetta Emery is aware. Pls Fu, offered later appt [phone]

## 2020-08-15 NOTE — Telephone Encounter (Signed)
Patient has been scheduled with Levada Dy for 09/07/20.

## 2020-08-16 ENCOUNTER — Other Ambulatory Visit: Payer: Self-pay | Admitting: Internal Medicine

## 2020-08-16 DIAGNOSIS — M62838 Other muscle spasm: Secondary | ICD-10-CM

## 2020-08-16 MED FILL — CYCLOBENZAPRINE 10 MG TAB: 10 | 30 days supply | Qty: 30 | Fill #0

## 2020-08-16 MED FILL — hydrOXYzine HCL 50 MG TABS: 50 | 30 days supply | Qty: 30 | Fill #1

## 2020-08-16 MED FILL — ATORVASTATIN CALCIUM 20 MG: 20 | 30 days supply | Qty: 30 | Fill #2

## 2020-08-16 NOTE — Telephone Encounter (Signed)
Requested medication (s) are due for refill today: no  Requested medication (s) are on the active medication list: yes  Last refill:  07/19/2020  Future visit scheduled: yes  Notes to clinic:  this refill cannot be delegated    Requested Prescriptions  Pending Prescriptions Disp Refills   cyclobenzaprine (FLEXERIL) 10 MG tablet [Pharmacy Med Name: CYCLOBENZAPRINE 10 MG TAB 10 Tablet] 30 tablet 1    Sig: TAKE 1 TABLET (10 MG TOTAL) BY MOUTH AT BEDTIME.      Not Delegated - Analgesics:  Muscle Relaxants Failed - 08/16/2020  9:14 AM      Failed - This refill cannot be delegated      Passed - Valid encounter within last 6 months    Recent Outpatient Visits           2 months ago Type 2 diabetes mellitus with hyperglycemia, with long-term current use of insulin ([place])   [place], Neoma Laming B, MD   4 months ago Migraine without aura and with status migrainosus, not intractable   [place] Karle Plumber B, MD   4 months ago Type 2 diabetes mellitus with hyperglycemia, with long-term current use of insulin [facility])   [place] Karle Plumber B, MD   4 months ago Type 2 diabetes mellitus with hyperglycemia, with long-term current use of insulin [facility])   [place], Annie Main L, RPH-CPP   5 months ago Type 2 diabetes mellitus with hyperglycemia, with long-term current use of insulin [facility])   [place], Jarome Matin, RPH-CPP       Future Appointments             In 3 weeks Thereasa Solo, Dionne Bucy, PA-C [place]   In 1 month [place], Stephan Minister, DO [facility] [place]

## 2020-08-21 MED FILL — ?ATORVASTATIN 20 MG TABLET: 20 | 30 days supply | Qty: 30 | Fill #2

## 2020-08-23 ENCOUNTER — Ambulatory Visit: Payer: Medicaid Other

## 2020-08-29 MED FILL — ?DULOXetine HCL 60MG CAPS: 60 | 30 days supply | Qty: 30 | Fill #2

## 2020-08-29 MED FILL — ?BASAGLAR 100 UNITS/ML KWPE: 100 | 26 days supply | Qty: 15 | Fill #1

## 2020-09-01 MED FILL — $novoLOG FLEXPEN SYRINGE: 100 | 91 days supply | Qty: 33 | Fill #0

## 2020-09-07 ENCOUNTER — Ambulatory Visit: Payer: Medicaid Other | Attending: Physician Assistant | Admitting: Physician Assistant

## 2020-09-11 ENCOUNTER — Other Ambulatory Visit: Payer: Self-pay | Admitting: Internal Medicine

## 2020-09-11 MED FILL — ?OMEPRAZOLE 20 MG CPDR: 20 | 30 days supply | Qty: 30 | Fill #0

## 2020-09-11 MED FILL — TOPIRAMATE 100 MG TABS: 100 | 30 days supply | Qty: 30 | Fill #3

## 2020-09-18 MED FILL — CYCLOBENZAPRINE 10 MG TAB: 10 | 30 days supply | Qty: 30 | Fill #1

## 2020-09-18 MED FILL — ?ATORVASTATIN 20 MG TABLET: 20 | 30 days supply | Qty: 30 | Fill #3

## 2020-09-18 MED FILL — hydrOXYzine HCL 50 MG TABS: 50 | 30 days supply | Qty: 30 | Fill #2

## 2020-09-18 NOTE — Progress Notes (Signed)
NEUROLOGY CONSULTATION NOTE  Whitney Benton MRN: [id number] DOB: 01-May-1971  Referring provider: Karle Plumber, MD Primary care provider: Karle Plumber, MD  Reason for consult:  migraines  HISTORY OF PRESENT ILLNESS: Whitney Benton is a 49 year old right-handed female with diabetes who presents for migraines.  History supplemented by referring provider's notes.  Migraines since 2000 but returned around 2019.  Initial workup in early 2000s included lumbar puncture but unsure of the results.  She doesn't remember if she had an MRI of the brain.  They are severe sharp and pounding pain in back of head bilaterally and radiates to the front.  She has associated flashes in her vision, photophobia, phonophobia, osmophobia, feels off-balance and sometimes nausea but no  Autonomic symptoms, numbness or weakness.  They usually last 2-3 weeks.  They occur about twice a year, usually in Spring and Summer but can occur other times.  Triggers unknown.  Nothing really relieves them.  CT brain on 06/14/2020 personally reviewed was normal. Eye exam in 2021 okay.  Current NSAIDS:  none Current analgesics:  none Current triptans:  Sumatriptan 6m (ineffective) Current ergotamine:  none Current anti-emetic:  Zofran ODT 433mCurrent muscle relaxants:  Flexeril 1061mHS Current anti-anxiolytic:  hydroxyzine Current sleep aide:  melatonin Current Antihypertensive medications:  none Current Antidepressant medications:  Cymbalta 64m49mily Current Anticonvulsant medications:  topiramate 100mg48mbedtime Current anti-CGRP:  none Current Vitamins/Herbal/Supplements:  Melatonin, B12 Current Antihistamines/Decongestants:  none Other therapy:  none Hormone/birth control:  none  Past NSAIDS:  Meloxicam, ketorolac, ibuprofen, naproxen Past analgesics:  Tylenol, Excedrin Past abortive triptans:  none Past abortive ergotamine:  none Past muscle relaxants:  Robaxin Past anti-emetic:  promethazine Past  antihypertensive medications:  none Past antidepressant medications:  Sertraline 50mg 8m anticonvulsant medications:  none Past anti-CGRP:  none Past vitamins/Herbal/Supplements:  none Past antihistamines/decongestants:  Benadryl, Zyrtec Other past therapies:  none  Caffeine:  Cut down on coffee.  Now only decaff.  No soda Diet:  Drinks water with electrolytes.  Does not skip meals Exercise:  Walks 30 minutes daily Depression:  yes; Anxiety:  yes Other pain:  no Sleep hygiene:  poor Family history of headache:  no   PAST MEDICAL HISTORY: Past Medical History:  Diagnosis Date  . Anxiety   . Depression   . Diabetes mellitus without complication (HCC)  [facility]     PAST SURGICAL HISTORY: Past Surgical History:  Procedure Laterality Date  . CESAREAN SECTION    . VAGINAL HYSTERECTOMY  2006   Fibroids, menorrhagia, benign pathology    MEDICATIONS: Current Outpatient Medications on File Prior to Visit  Medication Sig Dispense Refill  . atorvastatin (LIPITOR) 20 MG tablet TAKE 1 TABLET (20 MG TOTAL) BY MOUTH DAILY. 30 tablet 3  . Blood Glucose Monitoring Suppl (TRUE METRIX METER) w/Device KIT Check blood sugars three times a day 1 kit 0  . cyclobenzaprine (FLEXERIL) 10 MG tablet TAKE 1 TABLET (10 MG TOTAL) BY MOUTH AT BEDTIME. 30 tablet 1  . dicyclomine (BENTYL) 20 MG tablet Take 1 tablet (20 mg total) by mouth 2 (two) times daily as needed for spasms (abdominal pain). 20 tablet 0  . DULoxetine (CYMBALTA) 60 MG capsule TAKE 1 CAPSULE (60 MG TOTAL) BY MOUTH DAILY. 30 capsule 3  . glucose blood test strip Check blood sugars three times a day 100 each 12  . hydrOXYzine (ATARAX/VISTARIL) 50 MG tablet TAKE 1 TABLET (50 MG TOTAL) BY MOUTH AT BEDTIME. 90Centrahoma  tablet 0  . insulin aspart (NOVOLOG FLEXPEN) 100 UNIT/ML FlexPen Inject 22 units before breakfast, 12 units before lunch, and 22 units before dinner. 9 mL 3  . insulin glargine (LANTUS SOLOSTAR) 100 UNIT/ML Solostar  Pen Inject 56 Units into the skin at bedtime. (Patient taking differently: Inject 52 Units into the skin at bedtime. ) 15 mL 4  . Insulin Pen Needle (PEN NEEDLES) 31G X 6 MM MISC Use as directed 100 each 0  . melatonin 3 MG TABS tablet Take 1 tablet (3 mg total) by mouth at bedtime. 30 tablet 2  . Multiple Vitamin (MULTIVITAMIN ADULT) TABS Take 1 tablet by mouth daily.    Marland Kitchen omeprazole (PRILOSEC) 20 MG capsule TAKE 1 CAPSULE (20 MG TOTAL) BY MOUTH DAILY. 90 capsule 1  . ondansetron (ZOFRAN ODT) 4 MG disintegrating tablet Take 1 tablet (4 mg total) by mouth every 8 (eight) hours as needed for nausea or vomiting. 20 tablet 0  . pantoprazole (PROTONIX) 20 MG tablet Take 2 tablets (40 mg total) by mouth daily for 14 days. 28 tablet 0  . SUMAtriptan (IMITREX) 50 MG tablet Take 1 tablet at the start of the headache.  May repeat in 2 hours if no relief.  Max 2 tabs/24 hours (Patient taking differently: Take 50 mg by mouth every 2 (two) hours as needed for migraine or headache. Take 1 tablet at the start of the headache.  May repeat in 2 hours if no relief.  Max 2 tabs/24 hours) 10 tablet 2  . topiramate (TOPAMAX) 100 MG tablet Take 1 tablet (100 mg total) by mouth at bedtime. 30 tablet 3  . TRUEPLUS LANCETS 28G MISC Check blood sugars three time a day 100 each 12  . vitamin B-12 (CYANOCOBALAMIN) 100 MCG tablet Take 100 mcg by mouth daily.    . [DISCONTINUED] cetirizine (ZYRTEC) 10 MG tablet Take 1 tablet (10 mg total) by mouth daily. (Patient not taking: Reported on 11/02/2019) 30 tablet 1   No current facility-administered medications on file prior to visit.    ALLERGIES: Allergies  Allergen Reactions  . Aspirin Hives  . Oxycodone Nausea And Vomiting    FAMILY HISTORY: Family History  Problem Relation Age of Onset  . Diabetes Mother   . Mental illness Mother   . Depression Mother   . Hypertension Mother   . Hypertension Father   . Diabetes Father    SOCIAL HISTORY: Social History    Socioeconomic History  . Marital status: Significant Other    Spouse name: Not on file  . Number of children: 3  . Years of education: 14  . Highest education level: Not on file  Occupational History  . Not on file  Tobacco Use  . Smoking status: Current Every Day Smoker    Packs/day: 0.25    Years: 8.00    Pack years: 2.00    Types: Cigarettes  . Smokeless tobacco: Never Used  Vaping Use  . Vaping Use: Never used  Substance and Sexual Activity  . Alcohol use: Yes    Comment: occasional  . Drug use: No  . Sexual activity: Yes    Birth control/protection: Surgical  Other Topics Concern  . Not on file  Social History Narrative   Patient lives at home with mother and father    Patient has 3 children    Patient is single   Patient has 14 years of education    Patient is right handed    Social Determinants of Health  Financial Resource Strain:   . Difficulty of Paying Living Expenses: Not on file  Food Insecurity:   . Worried About Charity fundraiser in the Last Year: Not on file  . Ran Out of Food in the Last Year: Not on file  Transportation Needs:   . Lack of Transportation (Medical): Not on file  . Lack of Transportation (Non-Medical): Not on file  Physical Activity:   . Days of Exercise per Week: Not on file  . Minutes of Exercise per Session: Not on file  Stress:   . Feeling of Stress : Not on file  Social Connections:   . Frequency of Communication with Friends and Family: Not on file  . Frequency of Social Gatherings with Friends and Family: Not on file  . Attends Religious Services: Not on file  . Active Member of Clubs or Organizations: Not on file  . Attends Archivist Meetings: Not on file  . Marital Status: Not on file  Intimate Partner Violence:   . Fear of Current or Ex-Partner: Not on file  . Emotionally Abused: Not on file  . Physically Abused: Not on file  . Sexually Abused: Not on file     PHYSICAL EXAM: Blood pressure  119/87, pulse (!) 105, height _0  (1.6 m), weight 220 lb 9.6 oz (100.1 kg), SpO2 100 %. General: No acute distress.  Patient appears well-groomed.  Head:  Normocephalic/atraumatic Eyes:  fundi examined but not visualized Neck: supple, no paraspinal tenderness, full range of motion Back: No paraspinal tenderness Heart: regular rate and rhythm Lungs: Clear to auscultation bilaterally. Vascular: No carotid bruits. Neurological Exam: Mental status: alert and oriented to person, place, and time, recent and remote memory intact, fund of knowledge intact, attention and concentration intact, speech fluent and not dysarthric, language intact. Cranial nerves: CN I: not tested CN II: pupils equal, round and reactive to light, visual fields intact CN III, IV, VI:  full range of motion, no nystagmus, no ptosis CN V: facial sensation intact CN VII: upper and lower face symmetric CN VIII: hearing intact CN IX, X: gag intact, uvula midline CN XI: sternocleidomastoid and trapezius muscles intact CN XII: tongue midline Bulk & Tone: normal, no fasciculations. Motor:  5/5 throughout  Sensation:  Pinprick and vibration sensation intact. Deep Tendon Reflexes:  2+ throughout, toes downgoing.  Finger to nose testing:  Without dysmetria.  Heel to shin:  Without dysmetria.  Gait:  Normal station and stride.  Able to turn and tandem walk. Romberg negative.  IMPRESSION: 1.  Migraine with aura, with status migrainosus, intractable 2.  Bilateral occipital neuralgia  PLAN: Patient without insurance, so treatment options limited.  Occipital nerve blocks would not be an option. 1.  Will start nortriptyline 63m at bedtime.  We can increase to 271mat bedtime in 4 weeks if needed. 2.  For abortive therapy, she will discontinue sumatriptan and try rizatriptan 3.  Since migraines are infrequent but intractable, consider prednisone taper for flare up (although advised that she would need to monitor blood sugars).   Alternatively, we can treat with headache cocktail in office (she would need to call first). 4.  Limit use of pain relievers to no more than 2 days out of week to prevent risk of rebound or medication-overuse headache. 5.  Follow up in 4 to 6 months.  Thank you for allowing me to take part in the care of this patient.  AdMetta ClinesDO  CC: DeKarle PlumberMD

## 2020-09-20 ENCOUNTER — Encounter: Payer: Self-pay | Admitting: Neurology

## 2020-09-20 ENCOUNTER — Other Ambulatory Visit: Payer: Self-pay

## 2020-09-20 ENCOUNTER — Ambulatory Visit: Payer: Self-pay | Admitting: Neurology

## 2020-09-20 VITALS — BP 119/87 | HR 105 | Ht 63.0 in | Wt 220.6 lb

## 2020-09-20 DIAGNOSIS — G43111 Migraine with aura, intractable, with status migrainosus: Secondary | ICD-10-CM

## 2020-09-20 DIAGNOSIS — M5481 Occipital neuralgia: Secondary | ICD-10-CM

## 2020-09-20 MED ORDER — RIZATRIPTAN BENZOATE 10 MG PO TABS: 10.0000 mg | ORAL_TABLET | ORAL | 5 refills | Status: DC | PRN

## 2020-09-20 MED ORDER — NORTRIPTYLINE HCL 10 MG PO CAPS: 10.0000 mg | ORAL_CAPSULE | Freq: Every day | ORAL | 5 refills | Status: DC

## 2020-09-20 MED FILL — NORTRIPTYLINE HCL 10 MG CAP: 10 | 30 days supply | Qty: 30 | Fill #0

## 2020-09-20 MED FILL — RIZATRIPTAN BENZOATE 10 MG: 10 | 30 days supply | Qty: 9 | Fill #0

## 2020-09-20 NOTE — Patient Instructions (Signed)
  1. Start nortriptyline 10mg  at bedtime.  Contact us in 4 weeks with update and we can increase dose if needed. 2. Stop sumatriptan.  Take rizatriptan 10mg  at earliest onset of headache.  May repeat dose once in 2 hours if needed.  Maximum 2 tablets in 24 hours. 3. If you feel increased pain in back of head, may take cyclobenzaprine at bedtime. 4. Limit use of pain relievers to no more than 2 days out of the week.  These medications include acetaminophen, NSAIDs (ibuprofen/Advil/Motrin, naproxen/Aleve, triptans (Imitrex/sumatriptan), Excedrin, and narcotics.  This will help reduce risk of rebound headaches. 5. Be aware of common food triggers:  - Caffeine:  coffee, black tea, cola, Mt. Dew  - Chocolate  - Dairy:  aged cheeses (brie, blue, cheddar, gouda, [place], provolone, [place], Swiss, etc), chocolate milk, buttermilk, sour cream, limit eggs and yogurt  - Nuts, peanut butter  - Alcohol  - Cereals/grains:  FRESH breads (fresh bagels, sourdough, doughnuts), yeast productions  - Processed/canned/aged/cured meats (pre-packaged deli meats, hotdogs)  - MSG/glutamate:  soy sauce, flavor enhancer, pickled/preserved/marinated foods  - Sweeteners:  aspartame (Equal, Nutrasweet).  Sugar and Splenda are okay  - Vegetables:  legumes (lima beans, lentils, snow peas, fava beans, pinto peans, peas, garbanzo beans), sauerkraut, onions, olives, pickles  - Fruit:  avocados, bananas, citrus fruit (orange, lemon, grapefruit), mango  - Other:  Frozen meals, macaroni and cheese 6. Routine exercise 7. Stay adequately hydrated (aim for 64 oz water daily) 8. Keep headache diary 9. Maintain proper stress management 10. Maintain proper sleep hygiene 11. Do not skip meals 12. Consider supplements:  magnesium citrate 400mg  daily, riboflavin 400mg  daily, coenzyme Q10 100mg  three times daily. 13. Follow up in 4 to 6 months.

## 2020-09-27 ENCOUNTER — Other Ambulatory Visit: Payer: Self-pay | Admitting: Gastroenterology

## 2020-10-03 ENCOUNTER — Telehealth: Payer: Self-pay | Admitting: Internal Medicine

## 2020-10-03 ENCOUNTER — Other Ambulatory Visit: Payer: Self-pay

## 2020-10-03 DIAGNOSIS — Z1231 Encounter for screening mammogram for malignant neoplasm of breast: Secondary | ICD-10-CM

## 2020-10-03 MED FILL — ?DULOXetine HCL 60MG CAPS: 60 | 30 days supply | Qty: 30 | Fill #3

## 2020-10-03 MED FILL — PEG 3350-ELECTROLYTE SOLN: 420 | 1 days supply | Qty: 4000 | Fill #0

## 2020-10-03 NOTE — Telephone Encounter (Signed)
Order has been placed for mammogram

## 2020-10-03 NOTE — Telephone Encounter (Signed)
Pt is inquiring for a mammogram at the earliest available appt.

## 2020-10-04 ENCOUNTER — Telehealth: Payer: Self-pay | Admitting: Family Medicine

## 2020-10-04 NOTE — Telephone Encounter (Signed)
Patient is scheduled for an appointment with [place] this Friday but she want a nurse to call her state she feel like milk is in breast and they  Are very heavy

## 2020-10-06 ENCOUNTER — Other Ambulatory Visit: Payer: Self-pay

## 2020-10-06 ENCOUNTER — Ambulatory Visit: Payer: Medicaid Other | Admitting: Neurology

## 2020-10-06 ENCOUNTER — Encounter: Payer: Self-pay | Admitting: Family Medicine

## 2020-10-06 ENCOUNTER — Ambulatory Visit (INDEPENDENT_AMBULATORY_CARE_PROVIDER_SITE_OTHER): Payer: Self-pay | Admitting: Family Medicine

## 2020-10-06 VITALS — BP 120/91 | HR 97 | Wt 222.3 lb

## 2020-10-06 DIAGNOSIS — N644 Mastodynia: Secondary | ICD-10-CM

## 2020-10-06 DIAGNOSIS — B372 Candidiasis of skin and nail: Secondary | ICD-10-CM

## 2020-10-06 NOTE — Progress Notes (Signed)
   Subjective:    Patient ID: Whitney Benton, female    DOB: [birth year], 49 y.o.   MRN: [id number]  HPI Complains of breasts feeling full. Had vaginal hysterectomy in 2006 for pelvic pain and fibroid uterus. States that she started having perimenopausal symtoms then. Now having breast fullness and pain. No mammogram since 2015. No galactorrhea. No palliating or provoking factors.  Does have redness and itching under breasts due to sweating.  I have reviewed the patients past medical, family, and social history.  I have reviewed the patient's medication list and allergies.  Review of Systems     Objective:   Physical Exam Vitals reviewed. Exam conducted with a chaperone present.  Constitutional:      Appearance: Normal appearance.  Cardiovascular:     Rate and Rhythm: Normal rate and regular rhythm.     Pulses: Normal pulses.  Pulmonary:     Effort: Pulmonary effort is normal.  Chest:     Chest wall: Tenderness (diffuse) present. No mass, lacerations, deformity, swelling, crepitus or edema.     Breasts:        Right: Normal.        Left: Normal.  Neurological:     Mental Status: She is alert.         Assessment & Plan:  1. Breast pain Already on several medications for chronic pain/migraines: nortriptyline and duloxetine. At this point, I would be hesitant to add on gabapentin. Recommended ibuprofen and tylenol - 1 tab each. Could trial evening primrose oil and vitamin E.  Patient to get mammogram with mammogram scholarship.  2. Candidal intertrigo Clotrimazole prescribed.

## 2020-10-06 NOTE — Patient Instructions (Addendum)
For breast pain, you can take  Ibuprofen and Tylenol together (1 over the counter tablet each)  You can also try:  Vitamin E supplement 400mg  once a day  Evening Primrose Oil supplement.  Please follow up with mammogram scholarship.  You can use clotrimazole (over the counter) twice a day for the yeast infection under your breasts.

## 2020-10-09 NOTE — Telephone Encounter (Signed)
Pt concern addressed at her appt on 10/06/20.  Mel Almond, RN

## 2020-10-12 ENCOUNTER — Telehealth: Payer: Self-pay | Admitting: Clinical

## 2020-10-12 MED FILL — ?BASAGLAR 100 UNITS/ML KWPE: 100 | 26 days supply | Qty: 15 | Fill #2

## 2020-10-12 NOTE — Telephone Encounter (Signed)
Called pt and confirmed pt using two idenitifiers. Client reports understanding of her appointment next week and stated due to a headache she wished to not speak more at this time. -Elby Beck

## 2020-10-13 ENCOUNTER — Telehealth: Payer: Self-pay | Admitting: Neurology

## 2020-10-13 MED ORDER — NORTRIPTYLINE HCL 25 MG PO CAPS: 25.0000 mg | ORAL_CAPSULE | Freq: Every day | ORAL | 0 refills | Status: DC

## 2020-10-13 NOTE — Telephone Encounter (Signed)
Increase nortriptyline to 25mg  at bedtime.  If no improvement in 4 weeks, she is to contact us with another update for changes.

## 2020-10-13 NOTE — Telephone Encounter (Signed)
Pt advised of  DR. Tomi Likens note from her last visit 09/20/20, Pt is to call in 4 weeks after starting the Nortriptyline and let us know if we need to increase the dose.  Pt would like to increase the dose. She feels like the 10 mg is not working? Please advise   As for her f/u: Pt was advised Per DR. Tomi Likens last note: Pt is to f/u in 4-6 mos pt is schedule for 02/08/21

## 2020-10-13 NOTE — Telephone Encounter (Signed)
Pt advised of note. New script sent to the pharmacy.

## 2020-10-13 NOTE — Telephone Encounter (Signed)
Patient wants to know if she was supposed to come back in 6 weeks or 6 months? States she is asking because she can't remember and her medication isn't working right. States she can't get rid of her headache even after taking the medication as instructed.  Please call.

## 2020-10-16 MED FILL — ?RIZATRIPTAN 10MG TABLE: 10 | 30 days supply | Qty: 9 | Fill #1

## 2020-10-17 MED FILL — NORTRIPTYLINE HCL 25 MG CAP: 25 | 30 days supply | Qty: 30 | Fill #0

## 2020-10-18 ENCOUNTER — Encounter: Payer: Medicaid Other | Admitting: Clinical

## 2020-10-18 ENCOUNTER — Ambulatory Visit: Payer: Self-pay | Admitting: Clinical

## 2020-10-18 NOTE — BH Specialist Note (Signed)
Pt did not arrive to video visit and did not answer the phone; Left HIPPA-compliant message to call back Roselyn Reef from [facility] at [phone] (main office) or [phone] ([place] office); Left MyChart Message for patient.  -K. Reva Bores

## 2020-10-23 ENCOUNTER — Other Ambulatory Visit: Payer: Self-pay | Admitting: Internal Medicine

## 2020-10-23 DIAGNOSIS — M62838 Other muscle spasm: Secondary | ICD-10-CM

## 2020-10-23 MED FILL — ?ATORVASTATIN 20 MG TABLET: 20 | 30 days supply | Qty: 30 | Fill #0

## 2020-10-23 MED FILL — hydrOXYzine HCL 50 MG TABS: 50 | 30 days supply | Qty: 30 | Fill #0

## 2020-10-23 NOTE — Telephone Encounter (Signed)
/Requested/ medication (s) are due for refill today -yes // Requested medication (s) are on the active medication list -yes  Future visit scheduled -no  Last refill: 09/18/20  Notes to clinic: Request for non delegated rx  Requested Prescriptions  Pending Prescriptions Disp Refills   cyclobenzaprine (FLEXERIL) 10 MG tablet [Pharmacy Med Name: CYCLOBENZAPRINE 10 MG TAB 10 Tablet] 30 tablet 1    Sig: TAKE 1 TABLET (10 MG TOTAL) BY MOUTH AT BEDTIME.      Not Delegated - Analgesics:  Muscle Relaxants Failed - 10/23/2020 10:37 AM      Failed - This refill cannot be delegated      Passed - Valid encounter within last 6 months    Recent Outpatient Visits           4 months ago Type 2 diabetes mellitus with hyperglycemia, with long-term current use of insulin ([place])   [place], Neoma Laming B, MD   6 months ago Migraine without aura and with status migrainosus, not intractable   [place] Karle Plumber B, MD   6 months ago Type 2 diabetes mellitus with hyperglycemia, with long-term current use of insulin [facility])   [place] Karle Plumber B, MD   6 months ago Type 2 diabetes mellitus with hyperglycemia, with long-term current use of insulin [facility])   [place], Annie Main L, RPH-CPP   7 months ago Type 2 diabetes mellitus with hyperglycemia, with long-term current use of insulin [facility])   [place], Jarome Matin, RPH-CPP               Signed Prescriptions Disp Refills   atorvastatin (LIPITOR) 20 MG tablet 30 tablet 3    Sig: TAKE 1 TABLET (20 MG TOTAL) BY MOUTH DAILY.      Cardiovascular:  Antilipid - Statins Failed - 10/23/2020 10:37 AM      Failed - LDL in normal range and within 360 days    LDL Chol Calc (NIH)  Date Value Ref Range Status  02/04/2020 92 0 - 99 mg/dL Final          Passed - Total  Cholesterol in normal range and within 360 days    Cholesterol, Total  Date Value Ref Range Status  02/04/2020 159 100 - 199 mg/dL Final          Passed - HDL in normal range and within 360 days    HDL  Date Value Ref Range Status  02/04/2020 46 >39 mg/dL Final          Passed - Triglycerides in normal range and within 360 days    Triglycerides  Date Value Ref Range Status  02/04/2020 117 0 - 149 mg/dL Final          Passed - Patient is not pregnant      Passed - Valid encounter within last 12 months    Recent Outpatient Visits           4 months ago Type 2 diabetes mellitus with hyperglycemia, with long-term current use of insulin ([place])   [place], Neoma Laming B, MD   6 months ago Migraine without aura and with status migrainosus, not intractable   [place], Neoma Laming B, MD   6 months ago Type 2 diabetes mellitus with hyperglycemia, with long-term current use  of insulin [facility])   [place] Karle Plumber B, MD   6 months ago Type 2 diabetes mellitus with hyperglycemia, with long-term current use of insulin [facility])   [place], Annie Main L, RPH-CPP   7 months ago Type 2 diabetes mellitus with hyperglycemia, with long-term current use of insulin [facility])   [place], Stephen L, RPH-CPP                hydrOXYzine (ATARAX/VISTARIL) 50 MG tablet 30 tablet 0    Sig: TAKE 1 TABLET (50 MG TOTAL) BY MOUTH AT BEDTIME.      Ear, Nose, and Throat:  Antihistamines Passed - 10/23/2020 10:37 AM      Passed - Valid encounter within last 12 months    Recent Outpatient Visits           4 months ago Type 2 diabetes mellitus with hyperglycemia, with long-term current use of insulin ([place])   [place], Neoma Laming B, MD   6 months ago Migraine without aura and  with status migrainosus, not intractable   [place] Karle Plumber B, MD   6 months ago Type 2 diabetes mellitus with hyperglycemia, with long-term current use of insulin [facility])   [place] Karle Plumber B, MD   6 months ago Type 2 diabetes mellitus with hyperglycemia, with long-term current use of insulin [facility])   [place], Annie Main L, RPH-CPP   7 months ago Type 2 diabetes mellitus with hyperglycemia, with long-term current use of insulin [facility])   [place], Jarome Matin, RPH-CPP                  Requested Prescriptions  Pending Prescriptions Disp Refills   cyclobenzaprine (FLEXERIL) 10 MG tablet [Pharmacy Med Name: CYCLOBENZAPRINE 10 MG TAB 10 Tablet] 30 tablet 1    Sig: TAKE 1 TABLET (10 MG TOTAL) BY MOUTH AT BEDTIME.      Not Delegated - Analgesics:  Muscle Relaxants Failed - 10/23/2020 10:37 AM      Failed - This refill cannot be delegated      Passed - Valid encounter within last 6 months    Recent Outpatient Visits           4 months ago Type 2 diabetes mellitus with hyperglycemia, with long-term current use of insulin ([place])   [place], Neoma Laming B, MD   6 months ago Migraine without aura and with status migrainosus, not intractable   [place] Karle Plumber B, MD   6 months ago Type 2 diabetes mellitus with hyperglycemia, with long-term current use of insulin [facility])   [place] Karle Plumber B, MD   6 months ago Type 2 diabetes mellitus with hyperglycemia, with long-term current use of insulin [facility])   [place], Annie Main L, RPH-CPP   7 months ago Type 2 diabetes mellitus with hyperglycemia, with long-term current use of insulin [facility])   [place] Ausdall, Jarome Matin, RPH-CPP               Signed Prescriptions Disp Refills   atorvastatin (LIPITOR) 20 MG tablet 30 tablet 3    Sig:  TAKE 1 TABLET (20 MG TOTAL) BY MOUTH DAILY.      Cardiovascular:  Antilipid - Statins Failed - 10/23/2020 10:37 AM      Failed - LDL in normal range and within 360 days    LDL Chol Calc (NIH)  Date Value Ref Range Status  02/04/2020 92 0 - 99 mg/dL Final          Passed - Total Cholesterol in normal range and within 360 days    Cholesterol, Total  Date Value Ref Range Status  02/04/2020 159 100 - 199 mg/dL Final          Passed - HDL in normal range and within 360 days    HDL  Date Value Ref Range Status  02/04/2020 46 >39 mg/dL Final          Passed - Triglycerides in normal range and within 360 days    Triglycerides  Date Value Ref Range Status  02/04/2020 117 0 - 149 mg/dL Final          Passed - Patient is not pregnant      Passed - Valid encounter within last 12 months    Recent Outpatient Visits           4 months ago Type 2 diabetes mellitus with hyperglycemia, with long-term current use of insulin ([place])   [place], Neoma Laming B, MD   6 months ago Migraine without aura and with status migrainosus, not intractable   [place], Neoma Laming B, MD   6 months ago Type 2 diabetes mellitus with hyperglycemia, with long-term current use of insulin ([place])   [place] Karle Plumber B, MD   6 months ago Type 2 diabetes mellitus with hyperglycemia, with long-term current use of insulin [facility])   [place], Annie Main L, RPH-CPP   7 months ago Type 2 diabetes mellitus with hyperglycemia, with long-term current use of insulin [facility])   [place] Ausdall, Stephen L, RPH-CPP                hydrOXYzine (ATARAX/VISTARIL) 50 MG tablet 30  tablet 0    Sig: TAKE 1 TABLET (50 MG TOTAL) BY MOUTH AT BEDTIME.      Ear, Nose, and Throat:  Antihistamines Passed - 10/23/2020 10:37 AM      Passed - Valid encounter within last 12 months    Recent Outpatient Visits           4 months ago Type 2 diabetes mellitus with hyperglycemia, with long-term current use of insulin ([place])   [place], Neoma Laming B, MD   6 months ago Migraine without aura and with status migrainosus, not intractable   [place] Karle Plumber B, MD   6 months ago Type 2 diabetes mellitus with hyperglycemia, with long-term current use of insulin [facility])   [place] Karle Plumber B, MD   6 months ago Type 2 diabetes mellitus with hyperglycemia, with long-term current use of insulin [facility])   [place], Annie Main L, RPH-CPP   7 months ago Type 2 diabetes mellitus with hyperglycemia, with long-term current use of insulin [facility])   [place], RPH-CPP

## 2020-10-24 ENCOUNTER — Other Ambulatory Visit: Payer: Self-pay | Admitting: Internal Medicine

## 2020-10-24 MED FILL — CYCLOBENZAPRINE 10 MG TAB: 10 | 30 days supply | Qty: 30 | Fill #0

## 2020-10-30 ENCOUNTER — Other Ambulatory Visit: Payer: Self-pay | Admitting: Internal Medicine

## 2020-10-30 DIAGNOSIS — F32A Depression, unspecified: Secondary | ICD-10-CM

## 2020-10-30 MED FILL — ?DULOXetine HCL 60MG CAPS: 60 | 30 days supply | Qty: 30 | Fill #0

## 2020-10-30 NOTE — Telephone Encounter (Signed)
Requested Prescriptions  Pending Prescriptions Disp Refills  . DULoxetine (CYMBALTA) 60 MG capsule [Pharmacy Med Name: DULOXetine HCL 60MG  CAPS 60 Capsule] 30 capsule 3    Sig: TAKE 1 CAPSULE (60 MG TOTAL) BY MOUTH DAILY.     Psychiatry: Antidepressants - SNRI Failed - 10/30/2020  8:51 AM      Failed - Last BP in normal range    BP Readings from Last 1 Encounters:  10/06/20 (!) 120/91         Passed - Completed PHQ-2 or PHQ-9 in the last 360 days      Passed - Valid encounter within last 6 months    Recent Outpatient Visits          4 months ago Type 2 diabetes mellitus with hyperglycemia, with long-term current use of insulin ([place])   [place], Neoma Laming B, MD   6 months ago Migraine without aura and with status migrainosus, not intractable   [place], Neoma Laming B, MD   7 months ago Type 2 diabetes mellitus with hyperglycemia, with long-term current use of insulin [facility])   [place] Karle Plumber B, MD   7 months ago Type 2 diabetes mellitus with hyperglycemia, with long-term current use of insulin [facility])   [place], Annie Main L, RPH-CPP   7 months ago Type 2 diabetes mellitus with hyperglycemia, with long-term current use of insulin [facility])   [place], RPH-CPP

## 2020-10-31 MED FILL — CYCLOBENZAPRINE 10 MG TAB: 10 | 30 days supply | Qty: 30 | Fill #0

## 2020-11-14 ENCOUNTER — Other Ambulatory Visit: Payer: Self-pay | Admitting: Neurology

## 2020-11-14 MED FILL — ?BASAGLAR 100 UNITS/ML KWPE: 100 | 26 days supply | Qty: 15 | Fill #3

## 2020-11-14 MED FILL — NORTRIPTYLINE HCL 25 MG CAP: 25 | 30 days supply | Qty: 30 | Fill #0

## 2020-11-21 MED FILL — ?ATORVASTATIN 20 MG TABLET: 20 | 30 days supply | Qty: 30 | Fill #1

## 2020-11-23 ENCOUNTER — Other Ambulatory Visit: Payer: Self-pay | Admitting: Internal Medicine

## 2020-11-23 DIAGNOSIS — Z794 Long term (current) use of insulin: Secondary | ICD-10-CM

## 2020-11-23 DIAGNOSIS — E1165 Type 2 diabetes mellitus with hyperglycemia: Secondary | ICD-10-CM

## 2020-11-23 MED FILL — $novoLOG FLEXPEN SYRINGE: 100 | 32 days supply | Qty: 18 | Fill #0

## 2020-11-27 ENCOUNTER — Other Ambulatory Visit: Payer: Self-pay | Admitting: Internal Medicine

## 2020-11-27 MED FILL — ?DULOXetine HCL 60MG CAPS: 60 | 30 days supply | Qty: 30 | Fill #1

## 2020-11-28 MED FILL — hydrOXYzine HCL 50 MG TABS: 50 | 30 days supply | Qty: 30 | Fill #0

## 2020-12-01 ENCOUNTER — Other Ambulatory Visit: Payer: Self-pay | Admitting: Internal Medicine

## 2020-12-01 DIAGNOSIS — E1165 Type 2 diabetes mellitus with hyperglycemia: Secondary | ICD-10-CM

## 2020-12-01 DIAGNOSIS — Z794 Long term (current) use of insulin: Secondary | ICD-10-CM

## 2020-12-01 NOTE — Telephone Encounter (Signed)
Requested medication (s) are due for refill today:  No  Requested medication (s) are on the active medication list:  Yes  Future visit scheduled:  No  Last Refill: 11/23/20 ; #18; 0 refills.   Notes to Clinic:  attempted to call pt.  Left vm to call office to schedule a f/u appt. With Dr. Wynetta Emery; has already been given a 30 courtesy refill.  Requested Prescriptions  Pending Prescriptions Disp Refills   NOVOLOG FLEXPEN 100 UNIT/ML FlexPen [Pharmacy Med Name: novoLOG FLEXPEN SYRINGE 100 Solution Pen-injector] 18 mL 0    Sig: INJECT 22 UNITS BEFORE BREAKFAST, 12 UNITS BEFORE LUNCH, AND 22 UNITS BEFORE DINNER.      Endocrinology:  Diabetes - Insulins Failed - 12/01/2020  9:59 AM      Failed - HBA1C is between 0 and 7.9 and within 180 days    HbA1c, POC (controlled diabetic range)  Date Value Ref Range Status  06/08/2020   Final    Comment:    >15.0          Passed - Valid encounter within last 6 months    Recent Outpatient Visits           5 months ago Type 2 diabetes mellitus with hyperglycemia, with long-term current use of insulin ([place])   [place], Neoma Laming B, MD   7 months ago Migraine without aura and with status migrainosus, not intractable   [place], Neoma Laming B, MD   8 months ago Type 2 diabetes mellitus with hyperglycemia, with long-term current use of insulin [facility]   [place] Karle Plumber B, MD   8 months ago Type 2 diabetes mellitus with hyperglycemia, with long-term current use of insulin [facility])   [place], Annie Main L, RPH-CPP   8 months ago Type 2 diabetes mellitus with hyperglycemia, with long-term current use of insulin [facility])   [place], RPH-CPP

## 2020-12-22 ENCOUNTER — Other Ambulatory Visit: Payer: Self-pay | Admitting: Neurology

## 2020-12-22 ENCOUNTER — Other Ambulatory Visit: Payer: Self-pay | Admitting: Internal Medicine

## 2020-12-22 DIAGNOSIS — M62838 Other muscle spasm: Secondary | ICD-10-CM

## 2020-12-22 MED FILL — ?ATORVASTATIN 20 MG TABLET: 20 | 30 days supply | Qty: 30 | Fill #2

## 2020-12-23 ENCOUNTER — Other Ambulatory Visit: Payer: Self-pay

## 2020-12-23 ENCOUNTER — Encounter (HOSPITAL_COMMUNITY): Payer: Self-pay | Admitting: Emergency Medicine

## 2020-12-23 ENCOUNTER — Ambulatory Visit (HOSPITAL_COMMUNITY): Admit: 2020-12-23 | Discharge status: Absent without leave | Payer: Medicaid Other

## 2020-12-23 ENCOUNTER — Ambulatory Visit (HOSPITAL_COMMUNITY)
Admission: EM | Admit: 2020-12-23 | Discharge: 2020-12-23 | Disposition: A | Discharge status: Home / Self Care | Payer: Medicaid Other

## 2020-12-23 DIAGNOSIS — G43001 Migraine without aura, not intractable, with status migrainosus: Secondary | ICD-10-CM

## 2020-12-23 NOTE — ED Triage Notes (Signed)
Pt c/o constant headache and believes it may be r/t HTN... sx also include dizziness  A&O X4... NAD.Marland Kitchen. ambulatory

## 2020-12-23 NOTE — ED Provider Notes (Signed)
[facility]    CSN: [id number] Arrival date & time: 12/23/20  1744      History   Chief Complaint Chief Complaint  Patient presents with  . Hypertension    HPI Whitney Benton is a 50 y.o. female presenting for BP check and headaches. History of anxiety, depression, diabeets, AKI, DKA, migraine headaches, hypertension. -migraine for 2 days not controlled on rizatriptan and topiramate. Describes this as a 7/10. presenting today because her rizatriptan and topamax aren't controlling this as well as normal. She is followed by neurology for migraines. She states she was dizzy yestreday but this has improved; attributes to not drinking enough water. Denies worst headache of life, thunderclap headache, weakness/sensation changes in arms/legs, vision changes, shortness of breath, chest pain/pressure, photophobia, phonophobia, n/v/d.  -pt states her BP has been running 120/80-140/110 at home. Concerned this is too high. Denies chest pain/pressure, pain down arm, shortness of breath, vision changes, urinary symptoms. Taking BP medications as directed and took these this morning. Followed closely by PCP.   HPI  Past Medical History:  Diagnosis Date  . Anxiety   . Depression   . Diabetes mellitus without complication (HCC)   . Trichomonas infection     Patient Active Problem List   Diagnosis Date Noted  . Glaucoma suspect 04/12/2020  . DKA (diabetic ketoacidoses) 10/02/2019  . AKI (acute kidney injury) (HCC) 10/02/2019  . Hyperkalemia 10/02/2019  . Leukocytosis 10/02/2019  . Abnormal LFTs 10/02/2019  . Stressful life events affecting family and household 08/06/2019  . New onset type 2 diabetes mellitus (HCC) 02/04/2019  . Obesity (BMI 30-39.9) 02/04/2019  . Tobacco dependence 02/04/2019  . Left arm weakness 12/06/2014  . Paresthesias/numbness 12/06/2014  . Tobacco abuse 11/14/2014  . Depression   . Mixed incontinence 06/27/2014  . History of TVH in 2006 for fibroids and  menorrhagia; benign pathology 09/08/2011    Past Surgical History:  Procedure Laterality Date  . CESAREAN SECTION    . VAGINAL HYSTERECTOMY  2006   Fibroids, menorrhagia, benign pathology    OB History    Gravida  5   Para  3   Term  3   Preterm      AB  2   Living  3     SAB  2   IAB  0   Ectopic  0   Multiple  0   Live Births               Home Medications    Prior to Admission medications   Medication Sig Start Date End Date Taking? Authorizing Provider  atorvastatin (LIPITOR) 20 MG tablet TAKE 1 TABLET (20 MG TOTAL) BY MOUTH DAILY. 10/23/20   Johnson, Deborah B, MD  Blood Glucose Monitoring Suppl (TRUE METRIX METER) w/Device KIT Check blood sugars three times a day 02/04/19   Johnson, Deborah B, MD  cyclobenzaprine (FLEXERIL) 10 MG tablet Take 1 tablet (10 mg total) by mouth at bedtime. Must have office visit for refills 10/24/20   Johnson, Deborah B, MD  dicyclomine (BENTYL) 20 MG tablet Take 1 tablet (20 mg total) by mouth 2 (two) times daily as needed for spasms (abdominal pain). 08/10/20   Schlossman, Erin, MD  DULoxetine (CYMBALTA) 60 MG capsule TAKE 1 CAPSULE (60 MG TOTAL) BY MOUTH DAILY. 10/30/20   Johnson, Deborah B, MD  glucose blood test strip Check blood sugars three times a day 07/06/20   Johnson, Deborah B, MD  hydrOXYzine (ATARAX/VISTARIL) 50 MG tablet   TAKE 1 TABLET (50 MG TOTAL) BY MOUTH AT BEDTIME. 11/27/20   Johnson, Deborah B, MD  insulin glargine (LANTUS SOLOSTAR) 100 UNIT/ML Solostar Pen Inject 56 Units into the skin at bedtime. Patient taking differently: Inject 52 Units into the skin at bedtime.  06/08/20   Johnson, Deborah B, MD  Insulin Pen Needle (PEN NEEDLES) 31G X 6 MM MISC Use as directed 02/26/19   Johnson, Deborah B, MD  melatonin 3 MG TABS tablet Take 1 tablet (3 mg total) by mouth at bedtime. 05/03/20   Johnson, Deborah B, MD  Multiple Vitamin (MULTIVITAMIN ADULT) TABS Take 1 tablet by mouth daily.    [provider]   nortriptyline (PAMELOR) 25 MG capsule TAKE 1 CAPSULE (25 MG TOTAL) BY MOUTH AT BEDTIME. 11/14/20   Jaffe, Adam R, DO  NOVOLOG FLEXPEN 100 UNIT/ML FlexPen INJECT 22 UNITS BEFORE BREAKFAST, 12 UNITS BEFORE LUNCH, AND 22 UNITS BEFORE DINNER. 11/23/20   Johnson, Deborah B, MD  omeprazole (PRILOSEC) 20 MG capsule TAKE 1 CAPSULE (20 MG TOTAL) BY MOUTH DAILY. 09/11/20   Johnson, Deborah B, MD  ondansetron (ZOFRAN ODT) 4 MG disintegrating tablet Take 1 tablet (4 mg total) by mouth every 8 (eight) hours as needed for nausea or vomiting. 08/10/20   Schlossman, Erin, MD  pantoprazole (PROTONIX) 20 MG tablet Take 2 tablets (40 mg total) by mouth daily for 14 days. 08/10/20 08/24/20  Schlossman, Erin, MD  rizatriptan (MAXALT) 10 MG tablet Take 1 tablet (10 mg total) by mouth as needed for migraine (May repeat in 2 hours.  maximum 2 tablets in 24 hours.). May repeat in 2 hours if needed 09/20/20   Jaffe, Adam R, DO  topiramate (TOPAMAX) 100 MG tablet Take 1 tablet (100 mg total) by mouth at bedtime. 06/08/20   Johnson, Deborah B, MD  TRUEPLUS LANCETS 28G MISC Check blood sugars three time a day 02/04/19   Johnson, Deborah B, MD  vitamin B-12 (CYANOCOBALAMIN) 100 MCG tablet Take 100 mcg by mouth daily.    [provider]  cetirizine (ZYRTEC) 10 MG tablet Take 1 tablet (10 mg total) by mouth daily. Patient not taking: Reported on 11/02/2019 03/31/19 03/03/20  Newlin, Enobong, MD    Family History Family History  Problem Relation Age of Onset  . Diabetes Mother   . Mental illness Mother   . Depression Mother   . Hypertension Mother   . Hypertension Father   . Diabetes Father     Social History Social History   Tobacco Use  . Smoking status: Current Every Day Smoker    Packs/day: 0.25    Years: 8.00    Pack years: 2.00    Types: Cigarettes  . Smokeless tobacco: Never Used  Vaping Use  . Vaping Use: Never used  Substance Use Topics  . Alcohol use: Yes    Comment: occasional  . Drug use: No      Allergies   Aspirin and Oxycodone   Review of Systems Review of Systems  Respiratory: Negative for chest tightness and shortness of breath.   Cardiovascular: Negative for chest pain and palpitations.  Neurological: Positive for headaches. Negative for dizziness, syncope, facial asymmetry, weakness, light-headedness and numbness.  All other systems reviewed and are negative.    Physical Exam Triage Vital Signs ED Triage Vitals  Enc Vitals Group     BP 12/23/20 1915 129/86     Pulse Rate 12/23/20 1915 (!) 118     Resp 12/23/20 1915 20       Temp 12/23/20 1915 98.6 F (37 C)     Temp Source 12/23/20 1915 Oral     SpO2 12/23/20 1915 98 %     Weight --      Height --      Head Circumference --      Peak Flow --      Pain Score 12/23/20 1916 10     Pain Loc --      Pain Edu? --      Excl. in [place]? --    No data found.  Updated Vital Signs BP 129/86 (BP Location: Right Arm)   Pulse (!) 118   Temp 98.6 F (37 C) (Oral)   Resp 20   SpO2 98%   Visual Acuity Right Eye Distance:   Left Eye Distance:   Bilateral Distance:    Right Eye Near:   Left Eye Near:    Bilateral Near:     Physical Exam Vitals reviewed.  Constitutional:      General: She is not in acute distress.    Appearance: Normal appearance. She is not ill-appearing.  HENT:     Head: Normocephalic and atraumatic.  Eyes:     Extraocular Movements: Extraocular movements intact.     Pupils: Pupils are equal, round, and reactive to light.  Cardiovascular:     Rate and Rhythm: Normal rate and regular rhythm.     Heart sounds: Normal heart sounds.  Pulmonary:     Effort: Pulmonary effort is normal.     Breath sounds: Normal breath sounds. No wheezing, rhonchi or rales.  Musculoskeletal:     Cervical back: Normal range of motion and neck supple. No rigidity.  Lymphadenopathy:     Cervical: No cervical adenopathy.  Neurological:     General: No focal deficit present.     Mental Status: She is alert  and oriented to person, place, and time. Mental status is at baseline.     Cranial Nerves: Cranial nerves are intact. No cranial nerve deficit or facial asymmetry.     Sensory: Sensation is intact. No sensory deficit.     Motor: Motor function is intact. No weakness.     Coordination: Coordination is intact. Coordination normal.     Gait: Gait is intact. Gait normal.     Comments: CN 2-12 intact. No weakness or numbness in UEs or LEs.  Psychiatric:        Mood and Affect: Mood normal.        Behavior: Behavior normal.        Thought Content: Thought content normal.        Judgment: Judgment normal.      UC Treatments / Results  Labs (all labs ordered are listed, but only abnormal results are displayed) Labs Reviewed - No data to display  EKG   Radiology No results found.  Procedures Procedures (including critical care time)  Medications Ordered in UC Medications - No data to display  Initial Impression / Assessment and Plan / UC Course  I have reviewed the triage vital signs and the nursing notes.  Pertinent labs & imaging results that were available during my care of the patient were reviewed by me and considered in my medical decision making (see chart for details).     Pt with long history of infrequent but intractable migraines, followed by neurology for this. Per their last note 09/20/2020 (3 months ago)- "... Patient without insurance, so treatment options limited.  Occipital nerve blocks would not be  an option. 1.  Will start nortriptyline 10mg at bedtime.  We can increase to 25mg at bedtime in 4 weeks if needed. 2.  For abortive therapy, she will discontinue sumatriptan and try rizatriptan 3.  Since migraines are infrequent but intractable, consider prednisone taper for flare up (although advised that she would need to monitor blood sugars).  Alternatively, we can treat with headache cocktail in office (she would need to call first). 4.  Limit use of pain relievers  to no more than 2 days out of week to prevent risk of rebound or medication-overuse headache. 5.  Follow up in 4 to 6 months."  She again presents today for intractable migraine not controlled on her nortriptyline, rizatriptan, and topamax. Pt isover due for f/u with her neurologist. She is allergic to aspirin, so Toradol/naproxen/etc are not an option today. Pt with diabetes; sugars running 200s-300s, and so prednisone is not an option at this time. BP normal today at 129/96. She adamantly denies worst headache of life, vision changes, chest pain/pressure, urinary symptoms, etc. Denies worst headache of life, thunderclap headache, weakness/sensation changes in arms/legs, vision changes, shortness of breath, chest pain/pressure, photophobia, phonophobia, n/v/d. Benign neuro exam today. Discussed that she should continue the above regimen, and f/u with neurologist when they open in 1 day if she continues to have migraine symptoms. She can try tylenol up to 3000mg daily. She could also try caffeine. Continue to monitor BP at home.   Final Clinical Impressions(s) / UC Diagnoses   Final diagnoses:  Migraine without aura and with status migrainosus, not intractable     Discharge Instructions     -Take tylenol up to 3000mg daily for your migraines -Follow-up with your neurologist if you continue having severe migraines that are not controlled on your current medications -Your blood pressure is at goal today. Follow-up with PCP if you have elevated BP readings >180/120 at home. Continue checking BP at home.  -Seek immediate medical attention if you develop chest pain, shortness of breath, worst headache of life.    ED Prescriptions    None     PDMP not reviewed this encounter.   ,  E, PA-C 12/24/20 0808  

## 2020-12-23 NOTE — Discharge Instructions (Addendum)
-  Take tylenol up to 3000mg  daily for your migraines -Follow-up with your neurologist if you continue having severe migraines that are not controlled on your current medications -Your blood pressure is at goal today. Follow-up with PCP if you have elevated BP readings >180/120 at home. Continue checking BP at home.  -Seek immediate medical attention if you develop chest pain, shortness of breath, worst headache of life.

## 2020-12-24 NOTE — Telephone Encounter (Signed)
Requested medication (s) are due for refill today: yes  Requested medication (s) are on the active medication list: yes  Last refill:  10/24/20  Future visit scheduled: pt to establish care with new PCP 2/22  Notes to clinic:  med not delegated to NT to RF   Requested Prescriptions  Pending Prescriptions Disp Refills   cyclobenzaprine (FLEXERIL) 10 MG tablet [Pharmacy Med Name: CYCLOBENZAPRINE 10 MG TAB 10 Tablet] 30 tablet 0    Sig: Take 1 tablet (10 mg total) by mouth at bedtime. Must have office visit for refills      Not Delegated - Analgesics:  Muscle Relaxants Failed - 12/22/2020  7:45 AM      Failed - This refill cannot be delegated      Failed - Valid encounter within last 6 months    Recent Outpatient Visits           6 months ago Type 2 diabetes mellitus with hyperglycemia, with long-term current use of insulin (HCC)   [place] [facility] [place], Gavin Pound B, MD   8 months ago Migraine without aura and with status migrainosus, not intractable   [place] [facility] Jonah Blue B, MD   8 months ago Type 2 diabetes mellitus with hyperglycemia, with long-term current use of insulin [facility])   [place] [facility] Jonah Blue B, MD   9 months ago Type 2 diabetes mellitus with hyperglycemia, with long-term current use of insulin [facility])   [place] [facility] [place], Jeannett Senior L, RPH-CPP   9 months ago Type 2 diabetes mellitus with hyperglycemia, with long-term current use of insulin [facility])   [facility] [place], Cornelius Moras, RPH-CPP

## 2020-12-25 ENCOUNTER — Other Ambulatory Visit: Payer: Self-pay | Admitting: Neurology

## 2020-12-25 MED FILL — NORTRIPTYLINE HCL 25 MG CAP: 25 | 30 days supply | Qty: 30 | Fill #0

## 2020-12-28 MED FILL — ?RIZATRIPTAN 10MG TABLE: 10 | 30 days supply | Qty: 9 | Fill #2

## 2020-12-29 ENCOUNTER — Other Ambulatory Visit: Payer: Self-pay | Admitting: Internal Medicine

## 2020-12-29 MED FILL — ?DULOXetine HCL 60MG CAPS: 60 | 30 days supply | Qty: 30 | Fill #2

## 2020-12-29 MED FILL — hydrOXYzine HCL 50 MG TABS: 50 | 30 days supply | Qty: 30 | Fill #0

## 2021-01-23 ENCOUNTER — Emergency Department (HOSPITAL_COMMUNITY): Payer: Self-pay

## 2021-01-23 ENCOUNTER — Inpatient Hospital Stay (HOSPITAL_COMMUNITY)
Admission: EM | Admit: 2021-01-23 | Discharge: 2021-01-26 | DRG: 638 | Disposition: A | Discharge status: Home / Self Care | Payer: Self-pay | Source: Ambulatory Visit | Attending: Internal Medicine | Admitting: Internal Medicine

## 2021-01-23 ENCOUNTER — Encounter (HOSPITAL_COMMUNITY): Payer: Self-pay

## 2021-01-23 ENCOUNTER — Ambulatory Visit (HOSPITAL_COMMUNITY)
Admission: EM | Admit: 2021-01-23 | Discharge: 2021-01-23 | Disposition: A | Discharge status: Other Acute Inpatient Hospital | Payer: Self-pay | Attending: Internal Medicine | Admitting: Internal Medicine

## 2021-01-23 ENCOUNTER — Other Ambulatory Visit: Payer: Self-pay

## 2021-01-23 DIAGNOSIS — Z20822 Contact with and (suspected) exposure to covid-19: Secondary | ICD-10-CM | POA: Diagnosis present

## 2021-01-23 DIAGNOSIS — Z79899 Other long term (current) drug therapy: Secondary | ICD-10-CM

## 2021-01-23 DIAGNOSIS — G43909 Migraine, unspecified, not intractable, without status migrainosus: Secondary | ICD-10-CM | POA: Diagnosis present

## 2021-01-23 DIAGNOSIS — E669 Obesity, unspecified: Secondary | ICD-10-CM | POA: Diagnosis present

## 2021-01-23 DIAGNOSIS — R739 Hyperglycemia, unspecified: Secondary | ICD-10-CM

## 2021-01-23 DIAGNOSIS — N179 Acute kidney failure, unspecified: Secondary | ICD-10-CM | POA: Diagnosis present

## 2021-01-23 DIAGNOSIS — E119 Type 2 diabetes mellitus without complications: Secondary | ICD-10-CM

## 2021-01-23 DIAGNOSIS — Z8249 Family history of ischemic heart disease and other diseases of the circulatory system: Secondary | ICD-10-CM

## 2021-01-23 DIAGNOSIS — Z886 Allergy status to analgesic agent status: Secondary | ICD-10-CM

## 2021-01-23 DIAGNOSIS — Z885 Allergy status to narcotic agent status: Secondary | ICD-10-CM

## 2021-01-23 DIAGNOSIS — Z794 Long term (current) use of insulin: Secondary | ICD-10-CM

## 2021-01-23 DIAGNOSIS — Z818 Family history of other mental and behavioral disorders: Secondary | ICD-10-CM

## 2021-01-23 DIAGNOSIS — Z833 Family history of diabetes mellitus: Secondary | ICD-10-CM

## 2021-01-23 DIAGNOSIS — E1165 Type 2 diabetes mellitus with hyperglycemia: Secondary | ICD-10-CM

## 2021-01-23 DIAGNOSIS — R000 Tachycardia, unspecified: Secondary | ICD-10-CM

## 2021-01-23 DIAGNOSIS — E1110 Type 2 diabetes mellitus with ketoacidosis without coma: Principal | ICD-10-CM | POA: Diagnosis present

## 2021-01-23 DIAGNOSIS — R079 Chest pain, unspecified: Secondary | ICD-10-CM

## 2021-01-23 DIAGNOSIS — F419 Anxiety disorder, unspecified: Secondary | ICD-10-CM | POA: Diagnosis present

## 2021-01-23 DIAGNOSIS — E785 Hyperlipidemia, unspecified: Secondary | ICD-10-CM | POA: Diagnosis present

## 2021-01-23 DIAGNOSIS — E875 Hyperkalemia: Secondary | ICD-10-CM | POA: Diagnosis present

## 2021-01-23 DIAGNOSIS — F32A Depression, unspecified: Secondary | ICD-10-CM | POA: Diagnosis present

## 2021-01-23 DIAGNOSIS — F329 Major depressive disorder, single episode, unspecified: Secondary | ICD-10-CM

## 2021-01-23 DIAGNOSIS — F17210 Nicotine dependence, cigarettes, uncomplicated: Secondary | ICD-10-CM | POA: Diagnosis present

## 2021-01-23 DIAGNOSIS — Z9114 Patient's other noncompliance with medication regimen: Secondary | ICD-10-CM

## 2021-01-23 DIAGNOSIS — Z6837 Body mass index (BMI) 37.0-37.9, adult: Secondary | ICD-10-CM

## 2021-01-23 LAB — URINALYSIS, ROUTINE W REFLEX MICROSCOPIC
Bilirubin Urine: NEGATIVE
Glucose, UA: >=500 mg/dL — AB
Ketones, ur: >80 mg/dL — AB
Leukocytes,Ua: NEGATIVE
Nitrite: NEGATIVE
Protein, ur: 100 mg/dL — AB
Specific Gravity, Urine: 1.025 (ref 1.005–1.030)
pH: 5.5 (ref 5.0–8.0)

## 2021-01-23 LAB — I-STAT VENOUS BLOOD GAS, ED
Acid-base deficit: 17 mmol/L — ABNORMAL HIGH (ref 0.0–2.0)
Bicarbonate: 12.4 mmol/L — ABNORMAL LOW (ref 20.0–28.0)
Calcium, Ion: 1.15 mmol/L (ref 1.15–1.40)
HCT: 53 % — ABNORMAL HIGH (ref 36.0–46.0)
Hemoglobin: 18 g/dL — ABNORMAL HIGH (ref 12.0–15.0)
O2 Saturation: 97 %
Potassium: 7.2 mmol/L (ref 3.5–5.1)
Sodium: 131 mmol/L — ABNORMAL LOW (ref 135–145)
TCO2: 14 mmol/L — ABNORMAL LOW (ref 22–32)
pCO2, Ven: 41.4 mmHg — ABNORMAL LOW (ref 44.0–60.0)
pH, Ven: 7.086 — CL (ref 7.250–7.430)
pO2, Ven: 125 mmHg — ABNORMAL HIGH (ref 32.0–45.0)

## 2021-01-23 LAB — BASIC METABOLIC PANEL
Anion gap: 25 — ABNORMAL HIGH (ref 5–15)
Anion gap: 15 (ref 5–15)
Anion gap: 17 — ABNORMAL HIGH (ref 5–15)
BUN: 18 mg/dL (ref 6–20)
BUN: 13 mg/dL (ref 6–20)
BUN: 13 mg/dL (ref 6–20)
CO2: 10 mmol/L — ABNORMAL LOW (ref 22–32)
CO2: 11 mmol/L — ABNORMAL LOW (ref 22–32)
CO2: 13 mmol/L — ABNORMAL LOW (ref 22–32)
Calcium: 9.7 mg/dL (ref 8.9–10.3)
Calcium: 8.8 mg/dL — ABNORMAL LOW (ref 8.9–10.3)
Calcium: 9.3 mg/dL (ref 8.9–10.3)
Chloride: 99 mmol/L (ref 98–111)
Chloride: 106 mmol/L (ref 98–111)
Chloride: 106 mmol/L (ref 98–111)
Creatinine, Ser: 1.67 mg/dL — ABNORMAL HIGH (ref 0.44–1.00)
Creatinine, Ser: 1.29 mg/dL — ABNORMAL HIGH (ref 0.44–1.00)
Creatinine, Ser: 1.34 mg/dL — ABNORMAL HIGH (ref 0.44–1.00)
GFR, Estimated: 37 mL/min — ABNORMAL LOW (ref >60)
GFR, Estimated: 48 mL/min — ABNORMAL LOW (ref >60)
GFR, Estimated: 51 mL/min — ABNORMAL LOW (ref >60)
Glucose, Bld: 635 mg/dL (ref 70–99)
Glucose, Bld: 243 mg/dL — ABNORMAL HIGH (ref 70–99)
Glucose, Bld: 298 mg/dL — ABNORMAL HIGH (ref 70–99)
Potassium: 6.9 mmol/L (ref 3.5–5.1)
Potassium: 4.3 mmol/L (ref 3.5–5.1)
Potassium: 4.6 mmol/L (ref 3.5–5.1)
Sodium: 134 mmol/L — ABNORMAL LOW (ref 135–145)
Sodium: 132 mmol/L — ABNORMAL LOW (ref 135–145)
Sodium: 136 mmol/L (ref 135–145)

## 2021-01-23 LAB — I-STAT CHEM 8, ED
BUN: 29 mg/dL — ABNORMAL HIGH (ref 6–20)
Calcium, Ion: 1.17 mmol/L (ref 1.15–1.40)
Chloride: 104 mmol/L (ref 98–111)
Creatinine, Ser: 1 mg/dL (ref 0.44–1.00)
Glucose, Bld: 689 mg/dL (ref 70–99)
HCT: 54 % — ABNORMAL HIGH (ref 36.0–46.0)
Hemoglobin: 18.4 g/dL — ABNORMAL HIGH (ref 12.0–15.0)
Potassium: 7.3 mmol/L (ref 3.5–5.1)
Sodium: 131 mmol/L — ABNORMAL LOW (ref 135–145)
TCO2: 15 mmol/L — ABNORMAL LOW (ref 22–32)

## 2021-01-23 LAB — SARS CORONAVIRUS 2 (TAT 6-24 HRS): SARS Coronavirus 2: NEGATIVE

## 2021-01-23 LAB — CBC WITH DIFFERENTIAL/PLATELET
Abs Immature Granulocytes: 0.25 10*3/uL — ABNORMAL HIGH (ref 0.00–0.07)
Basophils Absolute: 0 10*3/uL (ref 0.0–0.1)
Basophils Relative: 0 %
Eosinophils Absolute: 0 10*3/uL (ref 0.0–0.5)
Eosinophils Relative: 0 %
HCT: 52.2 % — ABNORMAL HIGH (ref 36.0–46.0)
Hemoglobin: 16.3 g/dL — ABNORMAL HIGH (ref 12.0–15.0)
Immature Granulocytes: 1 %
Lymphocytes Relative: 8 %
Lymphs Abs: 1.5 10*3/uL (ref 0.7–4.0)
MCH: 27.7 pg (ref 26.0–34.0)
MCHC: 31.2 g/dL (ref 30.0–36.0)
MCV: 88.8 fL (ref 80.0–100.0)
Monocytes Absolute: 0.7 10*3/uL (ref 0.1–1.0)
Monocytes Relative: 4 %
Neutro Abs: 16.2 10*3/uL — ABNORMAL HIGH (ref 1.7–7.7)
Neutrophils Relative %: 87 %
Platelets: 517 10*3/uL — ABNORMAL HIGH (ref 150–400)
RBC: 5.88 MIL/uL — ABNORMAL HIGH (ref 3.87–5.11)
RDW: 13.2 % (ref 11.5–15.5)
WBC: 18.7 10*3/uL — ABNORMAL HIGH (ref 4.0–10.5)
nRBC: 0 % (ref 0.0–0.2)

## 2021-01-23 LAB — CBG MONITORING, ED
Glucose-Capillary: >600 mg/dL (ref 70–99)
Glucose-Capillary: >600 mg/dL (ref 70–99)
Glucose-Capillary: 566 mg/dL (ref 70–99)
Glucose-Capillary: 452 mg/dL — ABNORMAL HIGH (ref 70–99)
Glucose-Capillary: 511 mg/dL (ref 70–99)
Glucose-Capillary: 378 mg/dL — ABNORMAL HIGH (ref 70–99)
Glucose-Capillary: 470 mg/dL — ABNORMAL HIGH (ref 70–99)
Glucose-Capillary: 215 mg/dL — ABNORMAL HIGH (ref 70–99)
Glucose-Capillary: 254 mg/dL — ABNORMAL HIGH (ref 70–99)
Glucose-Capillary: 326 mg/dL — ABNORMAL HIGH (ref 70–99)
Glucose-Capillary: 296 mg/dL — ABNORMAL HIGH (ref 70–99)
Glucose-Capillary: 282 mg/dL — ABNORMAL HIGH (ref 70–99)

## 2021-01-23 LAB — URINALYSIS, MICROSCOPIC (REFLEX)

## 2021-01-23 LAB — LIPASE, BLOOD: Lipase: 35 U/L (ref 11–51)

## 2021-01-23 LAB — TROPONIN I (HIGH SENSITIVITY): Troponin I (High Sensitivity): 6 ng/L (ref <18)

## 2021-01-23 LAB — BETA-HYDROXYBUTYRIC ACID
Beta-Hydroxybutyric Acid: 7.76 mmol/L — ABNORMAL HIGH (ref 0.05–0.27)
Beta-Hydroxybutyric Acid: 7.27 mmol/L — ABNORMAL HIGH (ref 0.05–0.27)

## 2021-01-23 MED ORDER — LACTATED RINGERS IV BOLUS
2000.0000 mL | Freq: Once | INTRAVENOUS | Status: AC
  Administered 2021-01-23: 2000 mL via INTRAVENOUS

## 2021-01-23 MED ORDER — CALCIUM GLUCONATE-NACL 1-0.675 GM/50ML-% IV SOLN
1.0000 g | Freq: Once | INTRAVENOUS | Status: AC
  Administered 2021-01-23: 1000 mg via INTRAVENOUS
  Filled 2021-01-23: qty 50

## 2021-01-23 MED ORDER — SODIUM CHLORIDE 0.9 % IV SOLN
Freq: Once | INTRAVENOUS | Status: AC
  Administered 2021-01-23: 1000 mL/h via INTRAVENOUS

## 2021-01-23 MED ORDER — INSULIN REGULAR(HUMAN) IN NACL 100-0.9 UT/100ML-% IV SOLN
INTRAVENOUS | Status: DC
  Administered 2021-01-23: 15 [IU]/h via INTRAVENOUS
  Filled 2021-01-23 (×4): qty 100

## 2021-01-23 MED ORDER — DEXTROSE 50 % IV SOLN
0.0000 mL | INTRAVENOUS | Status: DC | PRN
  Administered 2021-01-24: 50 mL via INTRAVENOUS
  Filled 2021-01-23: qty 50

## 2021-01-23 MED ORDER — ENOXAPARIN SODIUM 40 MG/0.4ML ~~LOC~~ SOLN
40.0000 mg | SUBCUTANEOUS | Status: DC
  Administered 2021-01-23 – 2021-01-25 (×3): 40 mg via SUBCUTANEOUS
  Filled 2021-01-23 (×3): qty 0.4

## 2021-01-23 MED ORDER — DEXTROSE IN LACTATED RINGERS 5 % IV SOLN: INTRAVENOUS | Status: DC

## 2021-01-23 MED ORDER — LACTATED RINGERS IV SOLN: INTRAVENOUS | Status: DC

## 2021-01-23 NOTE — ED Provider Notes (Signed)
Whitney Benton   MRN: [id number] DOB: [birth year]  Subjective:   Whitney Benton is a 50 y.o. female presenting for 1 month history of persistent intermittent midsternal chest pain with associated nausea and vomiting.  Patient is an uncontrolled type II diabetic.  Admits that she has not been taking her insulin.  States that her symptoms have been dramatically worse in the past 3 days.  Denies any diaphoresis, abdominal pain, left arm pain, neck pain, jaw pain.  She is a smoker as well, has morbid obesity.  No history of MI or heart disease.  No current facility-administered medications for this encounter.  Current Outpatient Medications:  .  atorvastatin (LIPITOR) 20 MG tablet, TAKE 1 TABLET (20 MG TOTAL) BY MOUTH DAILY., Disp: 30 tablet, Rfl: 3 .  Blood Glucose Monitoring Suppl (TRUE METRIX METER) w/Device KIT, Check blood sugars three times a day, Disp: 1 kit, Rfl: 0 .  cyclobenzaprine (FLEXERIL) 10 MG tablet, Take 1 tablet (10 mg total) by mouth at bedtime. Must have office visit for refills, Disp: 30 tablet, Rfl: 0 .  dicyclomine (BENTYL) 20 MG tablet, Take 1 tablet (20 mg total) by mouth 2 (two) times daily as needed for spasms (abdominal pain)., Disp: 20 tablet, Rfl: 0 .  DULoxetine (CYMBALTA) 60 MG capsule, TAKE 1 CAPSULE (60 MG TOTAL) BY MOUTH DAILY., Disp: 30 capsule, Rfl: 3 .  glucose blood test strip, Check blood sugars three times a day, Disp: 100 each, Rfl: 12 .  hydrOXYzine (ATARAX/VISTARIL) 50 MG tablet, TAKE 1 TABLET (50 MG TOTAL) BY MOUTH AT BEDTIME., Disp: 30 tablet, Rfl: 0 .  insulin glargine (LANTUS SOLOSTAR) 100 UNIT/ML Solostar Pen, Inject 56 Units into the skin at bedtime. (Patient taking differently: Inject 52 Units into the skin at bedtime. ), Disp: 15 mL, Rfl: 4 .  Insulin Pen Needle (PEN NEEDLES) 31G X 6 MM MISC, Use as directed, Disp: 100 each, Rfl: 0 .  melatonin 3 MG TABS tablet, Take 1 tablet (3 mg total) by mouth at bedtime., Disp: 30 tablet, Rfl:  2 .  Multiple Vitamin (MULTIVITAMIN ADULT) TABS, Take 1 tablet by mouth daily., Disp: , Rfl:  .  nortriptyline (PAMELOR) 25 MG capsule, TAKE 1 CAPSULE (25 MG TOTAL) BY MOUTH AT BEDTIME., Disp: 30 capsule, Rfl: 0 .  NOVOLOG FLEXPEN 100 UNIT/ML FlexPen, INJECT 22 UNITS BEFORE BREAKFAST, 12 UNITS BEFORE LUNCH, AND 22 UNITS BEFORE DINNER., Disp: 18 mL, Rfl: 0 .  omeprazole (PRILOSEC) 20 MG capsule, TAKE 1 CAPSULE (20 MG TOTAL) BY MOUTH DAILY., Disp: 90 capsule, Rfl: 1 .  ondansetron (ZOFRAN ODT) 4 MG disintegrating tablet, Take 1 tablet (4 mg total) by mouth every 8 (eight) hours as needed for nausea or vomiting., Disp: 20 tablet, Rfl: 0 .  pantoprazole (PROTONIX) 20 MG tablet, Take 2 tablets (40 mg total) by mouth daily for 14 days., Disp: 28 tablet, Rfl: 0 .  rizatriptan (MAXALT) 10 MG tablet, Take 1 tablet (10 mg total) by mouth as needed for migraine (May repeat in 2 hours.  maximum 2 tablets in 24 hours.). May repeat in 2 hours if needed, Disp: 10 tablet, Rfl: 5 .  topiramate (TOPAMAX) 100 MG tablet, Take 1 tablet (100 mg total) by mouth at bedtime., Disp: 30 tablet, Rfl: 3 .  TRUEPLUS LANCETS 28G MISC, Check blood sugars three time a day, Disp: 100 each, Rfl: 12 .  vitamin B-12 (CYANOCOBALAMIN) 100 MCG tablet, Take 100 mcg by mouth daily., Disp: , Rfl:  Allergies  Allergen Reactions  . Aspirin Hives  . Oxycodone Nausea And Vomiting    Past Medical History:  Diagnosis Date  . Anxiety   . Depression   . Diabetes mellitus without complication ([place])   . Trichomonas infection      Past Surgical History:  Procedure Laterality Date  . CESAREAN SECTION    . VAGINAL HYSTERECTOMY  2006   Fibroids, menorrhagia, benign pathology    Family History  Problem Relation Age of Onset  . Diabetes Mother   . Mental illness Mother   . Depression Mother   . Hypertension Mother   . Hypertension Father   . Diabetes Father     Social History   Tobacco Use  . Smoking status: Current Every Day  Smoker    Packs/day: 0.25    Years: 8.00    Pack years: 2.00    Types: Cigarettes  . Smokeless tobacco: Never Used  Vaping Use  . Vaping Use: Never used  Substance Use Topics  . Alcohol use: Yes    Comment: occasional  . Drug use: No    ROS   Objective:   Vitals: BP (!) 147/97 (BP Location: Right Arm)   Pulse (!) 132   Temp 98.3 F (36.8 C) (Oral)   Resp 20   SpO2 100%   Physical Exam Constitutional:      General: She is not in acute distress.    Appearance: Normal appearance. She is well-developed. She is ill-appearing. She is not toxic-appearing or diaphoretic.  HENT:     Head: Normocephalic and atraumatic.     Nose: Nose normal.     Mouth/Throat:     Mouth: Mucous membranes are moist.  Eyes:     Extraocular Movements: Extraocular movements intact.     Pupils: Pupils are equal, round, and reactive to light.  Cardiovascular:     Rate and Rhythm: Regular rhythm. Tachycardia present.     Pulses: Normal pulses.     Heart sounds: Normal heart sounds. No murmur heard. No friction rub. No gallop.   Pulmonary:     Effort: Pulmonary effort is normal. No respiratory distress.     Breath sounds: Normal breath sounds. No stridor. No wheezing, rhonchi or rales.     Comments: Tachypneic at 30 respirations. Skin:    General: Skin is warm and dry.     Findings: No rash.  Neurological:     Mental Status: She is alert and oriented to person, place, and time.  Psychiatric:        Mood and Affect: Mood normal.        Behavior: Behavior normal.        Thought Content: Thought content normal.     ED ECG REPORT   Date: 01/23/2021  Rate: 133bpm  Rhythm: sinus tachycardia  QRS Axis: right  Intervals: normal  ST/T Wave abnormalities: nonspecific T wave changes  Conduction Disutrbances:none  Narrative Interpretation: Sinus tachycardia at 133 bpm with nonspecific T wave flattening in lead aVL.  Very comparable to previous EKG.  Old EKG Reviewed: unchanged  I have  personally reviewed the EKG tracing and agree with the computerized printout as noted.  Results for orders placed or performed during the hospital encounter of 01/23/21 (from the past 24 hour(s))  POC CBG monitoring     Status: Abnormal   Collection Time: 01/23/21 10:12 AM  Result Value Ref Range   Glucose-Capillary >600 (HH) 70 - 99 mg/dL    Assessment and Plan :  PDMP not reviewed this encounter.  1. Tachycardia   2. Hyperglycemia   3. Uncontrolled type 2 diabetes mellitus with hyperglycemia (HCC)   4. Chest pain, unspecified type     Suspicion is for diabetic ketoacidosis.  An IV line was established, liter of normal saline started.  Case reported out to EMS, transported to [facility] emergency room for further intervention.    Jaynee Eagles, PA-C 01/23/21 1018

## 2021-01-23 NOTE — ED Notes (Signed)
Check bs at 1402

## 2021-01-23 NOTE — ED Notes (Signed)
1621 next bs

## 2021-01-23 NOTE — ED Triage Notes (Signed)
Pt BIB EMS from [facility] due to vomiting,cp since Saturday. Pt reports she also hasnt been taking her insulin due to not eating and drinking bc of vomiting.

## 2021-01-23 NOTE — ED Notes (Signed)
Next bloodsugar 1323

## 2021-01-23 NOTE — ED Provider Notes (Signed)
[name] Provider Note   CSN: [id number] Arrival date & time: 01/23/21  1039     History Chief Complaint  Patient presents with  . Hyperglycemia    Whitney Benton is a 50 y.o. female.  50 yo F with a chief complaints of nausea and vomiting.  Is been going for at least a week.  Denies cough congestion or fever.  Has some mild upper abdominal pain that seems to come and go.  Has not been taking her insulin at home.  Has some left-sided sharp chest pain worse with movement palpation and twisting.  Denies trauma.  Went to urgent care and was found to have a blood sugar greater than 600 and was sent down to the ED for evaluation.  The history is provided by the patient.  Hyperglycemia Severity:  Moderate Onset quality:  Gradual Duration:  1 week Timing:  Constant Progression:  Worsening Chronicity:  New Diabetes status:  Controlled with insulin Time since last antidiabetic medication:  2 weeks Context: recent illness   Relieved by:  Nothing Ineffective treatments:  None tried Associated symptoms: abdominal pain, chest pain, dehydration, fatigue, nausea, polyuria and vomiting   Associated symptoms: no dizziness, no dysuria, no fever and no shortness of breath   Illness Associated symptoms: abdominal pain, chest pain, fatigue, nausea and vomiting   Associated symptoms: no congestion, no fever, no headaches, no myalgias, no rhinorrhea, no shortness of breath and no wheezing        Past Medical History:  Diagnosis Date  . Anxiety   . Depression   . Diabetes mellitus without complication ([place])   . Trichomonas infection     Patient Active Problem List   Diagnosis Date Noted  . Glaucoma suspect 04/12/2020  . DKA (diabetic ketoacidoses) 10/02/2019  . AKI (acute kidney injury) ([place]) 10/02/2019  . Hyperkalemia 10/02/2019  . Leukocytosis 10/02/2019  . Abnormal LFTs 10/02/2019  . Stressful life events affecting family and household  08/06/2019  . New onset type 2 diabetes mellitus ([place]) 02/04/2019  . Obesity (BMI 30-39.9) 02/04/2019  . Tobacco dependence 02/04/2019  . Left arm weakness 12/06/2014  . Paresthesias/numbness 12/06/2014  . Tobacco abuse 11/14/2014  . Depression   . Mixed incontinence 06/27/2014  . History of TVH in 2006 for fibroids and menorrhagia; benign pathology 09/08/2011    Past Surgical History:  Procedure Laterality Date  . CESAREAN SECTION    . VAGINAL HYSTERECTOMY  2006   Fibroids, menorrhagia, benign pathology     OB History    Gravida  5   Para  3   Term  3   Preterm      AB  2   Living  3     SAB  2   IAB  0   Ectopic  0   Multiple  0   Live Births              Family History  Problem Relation Age of Onset  . Diabetes Mother   . Mental illness Mother   . Depression Mother   . Hypertension Mother   . Hypertension Father   . Diabetes Father     Social History   Tobacco Use  . Smoking status: Current Every Day Smoker    Packs/day: 0.25    Years: 8.00    Pack years: 2.00    Types: Cigarettes  . Smokeless tobacco: Never Used  Vaping Use  . Vaping Use: Never used  Substance  Use Topics  . Alcohol use: Yes    Comment: occasional  . Drug use: No    Home Medications Prior to Admission medications   Medication Sig Start Date End Date Taking? Authorizing Provider  atorvastatin (LIPITOR) 20 MG tablet TAKE 1 TABLET (20 MG TOTAL) BY MOUTH DAILY. 10/23/20   Ladell Pier, MD  Blood Glucose Monitoring Suppl (TRUE METRIX METER) w/Device KIT Check blood sugars three times a day 02/04/19   Ladell Pier, MD  cyclobenzaprine (FLEXERIL) 10 MG tablet Take 1 tablet (10 mg total) by mouth at bedtime. Must have office visit for refills 10/24/20   Ladell Pier, MD  dicyclomine (BENTYL) 20 MG tablet Take 1 tablet (20 mg total) by mouth 2 (two) times daily as needed for spasms (abdominal pain). 08/10/20   Gareth Morgan, MD  DULoxetine (CYMBALTA) 60 MG  capsule TAKE 1 CAPSULE (60 MG TOTAL) BY MOUTH DAILY. 10/30/20   Ladell Pier, MD  glucose blood test strip Check blood sugars three times a day 07/06/20   Ladell Pier, MD  hydrOXYzine (ATARAX/VISTARIL) 50 MG tablet TAKE 1 TABLET (50 MG TOTAL) BY MOUTH AT BEDTIME. 12/29/20   Ladell Pier, MD  insulin glargine (LANTUS SOLOSTAR) 100 UNIT/ML Solostar Pen Inject 56 Units into the skin at bedtime. Patient taking differently: Inject 52 Units into the skin at bedtime.  06/08/20   Ladell Pier, MD  Insulin Pen Needle (PEN NEEDLES) 31G X 6 MM MISC Use as directed 02/26/19   Ladell Pier, MD  melatonin 3 MG TABS tablet Take 1 tablet (3 mg total) by mouth at bedtime. 05/03/20   Ladell Pier, MD  Multiple Vitamin (MULTIVITAMIN ADULT) TABS Take 1 tablet by mouth daily.    [provider]  nortriptyline (PAMELOR) 25 MG capsule TAKE 1 CAPSULE (25 MG TOTAL) BY MOUTH AT BEDTIME. 12/25/20   Jaffe, Adam R, DO  NOVOLOG FLEXPEN 100 UNIT/ML FlexPen INJECT 22 UNITS BEFORE BREAKFAST, 12 UNITS BEFORE LUNCH, AND 22 UNITS BEFORE DINNER. 11/23/20   Ladell Pier, MD  omeprazole (PRILOSEC) 20 MG capsule TAKE 1 CAPSULE (20 MG TOTAL) BY MOUTH DAILY. 09/11/20   Ladell Pier, MD  ondansetron (ZOFRAN ODT) 4 MG disintegrating tablet Take 1 tablet (4 mg total) by mouth every 8 (eight) hours as needed for nausea or vomiting. 08/10/20   Gareth Morgan, MD  pantoprazole (PROTONIX) 20 MG tablet Take 2 tablets (40 mg total) by mouth daily for 14 days. 08/10/20 08/24/20  Gareth Morgan, MD  rizatriptan (MAXALT) 10 MG tablet Take 1 tablet (10 mg total) by mouth as needed for migraine (May repeat in 2 hours.  maximum 2 tablets in 24 hours.). May repeat in 2 hours if needed 09/20/20   Pieter Partridge, DO  topiramate (TOPAMAX) 100 MG tablet Take 1 tablet (100 mg total) by mouth at bedtime. 06/08/20   Ladell Pier, MD  TRUEPLUS LANCETS 28G MISC Check blood sugars three time a day 02/04/19   Ladell Pier, MD  vitamin B-12 (CYANOCOBALAMIN) 100 MCG tablet Take 100 mcg by mouth daily.    [provider]  cetirizine (ZYRTEC) 10 MG tablet Take 1 tablet (10 mg total) by mouth daily. Patient not taking: Reported on 11/02/2019 03/31/19 03/03/20  Charlott Rakes, MD    Allergies    Aspirin and Oxycodone  Review of Systems   Review of Systems  Constitutional: Positive for fatigue. Negative for chills and fever.  HENT: Negative for congestion  and rhinorrhea.   Eyes: Negative for redness and visual disturbance.  Respiratory: Negative for shortness of breath and wheezing.   Cardiovascular: Positive for chest pain. Negative for palpitations.  Gastrointestinal: Positive for abdominal pain, nausea and vomiting.  Endocrine: Positive for polyuria.  Genitourinary: Negative for dysuria and urgency.  Musculoskeletal: Negative for arthralgias and myalgias.  Skin: Negative for pallor and wound.  Neurological: Negative for dizziness and headaches.    Physical Exam Updated Vital Signs BP (!) 139/95   Pulse (!) 136   Temp 98 F (36.7 C) (Oral)   Resp (!) 26   Ht 5' 3"  (1.6 m)   Wt 96.6 kg   SpO2 100%   BMI 37.73 kg/m   Physical Exam Vitals and nursing note reviewed.  Constitutional:      General: She is not in acute distress.    Appearance: She is well-developed and well-nourished. She is not diaphoretic.     Comments: Elevated BMI  HENT:     Head: Normocephalic and atraumatic.  Eyes:     Extraocular Movements: EOM normal.     Pupils: Pupils are equal, round, and reactive to light.  Cardiovascular:     Rate and Rhythm: Normal rate and regular rhythm.     Heart sounds: No murmur heard. No friction rub. No gallop.   Pulmonary:     Effort: Pulmonary effort is normal.     Breath sounds: No wheezing or rales.  Abdominal:     General: There is no distension.     Palpations: Abdomen is soft.     Tenderness: There is no abdominal tenderness.     Comments: Benign abdominal  exam  Musculoskeletal:        General: Tenderness present. No edema.     Cervical back: Normal range of motion and neck supple.     Comments: Palpation of the left anterior chest wall reproduces the patient's pain.  Worst along the navicular line about ribs 4 through 6.  Skin:    General: Skin is warm and dry.  Neurological:     Mental Status: She is alert and oriented to person, place, and time.  Psychiatric:        Mood and Affect: Mood and affect normal.        Behavior: Behavior normal.     ED Results / Procedures / Treatments   Labs (all labs ordered are listed, but only abnormal results are displayed) Labs Reviewed  CBC WITH DIFFERENTIAL/PLATELET - Abnormal; Notable for the following components:      Result Value   WBC 18.7 (*)    RBC 5.88 (*)    Hemoglobin 16.3 (*)    HCT 52.2 (*)    Platelets 517 (*)    Neutro Abs 16.2 (*)    Abs Immature Granulocytes 0.25 (*)    All other components within normal limits  URINALYSIS, ROUTINE W REFLEX MICROSCOPIC - Abnormal; Notable for the following components:   Glucose, UA >=500 (*)    Hgb urine dipstick SMALL (*)    Ketones, ur >80 (*)    Protein, ur 100 (*)    All other components within normal limits  BASIC METABOLIC PANEL - Abnormal; Notable for the following components:   Sodium 134 (*)    Potassium 6.9 (*)    CO2 10 (*)    Glucose, Bld 635 (*)    Creatinine, Ser 1.67 (*)    GFR, Estimated 37 (*)    Anion gap 25 (*)  All other components within normal limits  BETA-HYDROXYBUTYRIC ACID - Abnormal; Notable for the following components:   Beta-Hydroxybutyric Acid 7.76 (*)    All other components within normal limits  URINALYSIS, MICROSCOPIC (REFLEX) - Abnormal; Notable for the following components:   Bacteria, UA RARE (*)    All other components within normal limits  CBG MONITORING, ED - Abnormal; Notable for the following components:   Glucose-Capillary >600 (*)    All other components within normal limits  I-STAT  VENOUS BLOOD GAS, ED - Abnormal; Notable for the following components:   pH, Ven 7.086 (*)    pCO2, Ven 41.4 (*)    pO2, Ven 125.0 (*)    Bicarbonate 12.4 (*)    TCO2 14 (*)    Acid-base deficit 17.0 (*)    Sodium 131 (*)    Potassium 7.2 (*)    HCT 53.0 (*)    Hemoglobin 18.0 (*)    All other components within normal limits  I-STAT CHEM 8, ED - Abnormal; Notable for the following components:   Sodium 131 (*)    Potassium 7.3 (*)    BUN 29 (*)    Glucose, Bld 689 (*)    TCO2 15 (*)    Hemoglobin 18.4 (*)    HCT 54.0 (*)    All other components within normal limits  CBG MONITORING, ED - Abnormal; Notable for the following components:   Glucose-Capillary 566 (*)    All other components within normal limits  CBG MONITORING, ED - Abnormal; Notable for the following components:   Glucose-Capillary 511 (*)    All other components within normal limits  SARS CORONAVIRUS 2 (TAT 6-24 HRS)  LIPASE, BLOOD  BASIC METABOLIC PANEL  BASIC METABOLIC PANEL  BASIC METABOLIC PANEL  BETA-HYDROXYBUTYRIC ACID  BASIC METABOLIC PANEL  TROPONIN I (HIGH SENSITIVITY)    EKG EKG Interpretation  Date/Time:  Tuesday January 23 2021 12:03:42 EST Ventricular Rate:  120 PR Interval:    QRS Duration: 99 QT Interval:  346 QTC Calculation: 489 R Axis:   -46 Text Interpretation: Sinus tachycardia Consider right atrial enlargement Left anterior fascicular block Anteroseptal infarct, old ? peaked t waves Prolonged QT Otherwise no significant change Confirmed by Deno Etienne [phone]) on 01/23/2021 1:25:45 PM   Radiology DG Chest Port 1 View  Result Date: 01/23/2021 CLINICAL DATA:  Chest pain. EXAM: PORTABLE CHEST 1 VIEW COMPARISON:  Chest radiograph and CTA 08/10/2020 FINDINGS: The cardiomediastinal silhouette is unchanged with normal heart size. The lungs are mildly hypoinflated with mild platelike atelectasis in the right lung base. No edema, pleural effusion, pneumothorax is identified. No acute osseous  abnormality is seen. IMPRESSION: No active disease. Electronically Signed   By: Logan Bores M.D.   On: 01/23/2021 11:31    Procedures Procedures   Medications Ordered in ED Medications  insulin regular, human (MYXREDLIN) 100 units/ 100 mL infusion (15 Units/hr Intravenous Rate/Dose Change 01/23/21 1333)  lactated ringers infusion ( Intravenous New Bag/Given 01/23/21 1209)  dextrose 5 % in lactated ringers infusion (0 mLs Intravenous Hold 01/23/21 1148)  dextrose 50 % solution 0-50 mL (has no administration in time range)  calcium gluconate 1 g/ 50 mL sodium chloride IVPB (1,000 mg Intravenous New Bag/Given 01/23/21 1337)  lactated ringers bolus 2,000 mL (0 mLs Intravenous Stopped 01/23/21 1251)    ED Course  I have reviewed the triage vital signs and the nursing notes.  Pertinent labs & imaging results that were available during my care of the patient  were reviewed by me and considered in my medical decision making (see chart for details).    MDM Rules/Calculators/A&P                          50 yo F with a chief complaint of nausea vomiting and hyperglycemia.  This been going on for at least a week.  She went to urgent care and had a blood sugar greater than 600 and was sent to the ED.  Tachycardic into the 130s.  Will give 2 L of IV fluids.  Lab work to evaluate for DKA.  Reassess.  Patient has chest pain which is atypical and reproduced on exam.  Benign abdominal exam.  Patient is in DKA.  pH 7.  Will start on a insulin infusion.  There is also hyperkalemia.  EKG with possibly peaked T waves.  Will give a dose of calcium.  Will discuss with medicine for admission.  CRITICAL CARE Performed by: Cecilio Asper   Total critical care time: 35 minutes  Critical care time was exclusive of separately billable procedures and treating other patients.  Critical care was necessary to treat or prevent imminent or life-threatening deterioration.  Critical care was time spent personally by  me on the following activities: development of treatment plan with patient and/or surrogate as well as nursing, discussions with consultants, evaluation of patient's response to treatment, examination of patient, obtaining history from patient or surrogate, ordering and performing treatments and interventions, ordering and review of laboratory studies, ordering and review of radiographic studies, pulse oximetry and re-evaluation of patient's condition.  The patients results and plan were reviewed and discussed.   Any x-rays performed were independently reviewed by myself.   Differential diagnosis were considered with the presenting HPI.  Medications  insulin regular, human (MYXREDLIN) 100 units/ 100 mL infusion (15 Units/hr Intravenous Rate/Dose Change 01/23/21 1333)  lactated ringers infusion ( Intravenous New Bag/Given 01/23/21 1209)  dextrose 5 % in lactated ringers infusion (0 mLs Intravenous Hold 01/23/21 1148)  dextrose 50 % solution 0-50 mL (has no administration in time range)  calcium gluconate 1 g/ 50 mL sodium chloride IVPB (1,000 mg Intravenous New Bag/Given 01/23/21 1337)  lactated ringers bolus 2,000 mL (0 mLs Intravenous Stopped 01/23/21 1251)    Vitals:   01/23/21 1300 01/23/21 1315 01/23/21 1330 01/23/21 1345  BP: 112/82 (!) 154/96 138/84 (!) 139/95  Pulse: (!) 121 (!) 124 (!) 130 (!) 136  Resp: (!) 27 (!) 23 (!) 23 (!) 26  Temp:      TempSrc:      SpO2: 99% 99% 99% 100%  Weight:      Height:        Final diagnoses:  DKA, type 2, not at goal [facility])    Admission/ observation were discussed with the admitting physician, patient and/or family and they are comfortable with the plan.    Final Clinical Impression(s) / ED Diagnoses Final diagnoses:  DKA, type 2, not at goal [facility])    Rx / [place] Orders ED Discharge Orders    None       Deno Etienne, DO 01/23/21 1402

## 2021-01-23 NOTE — ED Triage Notes (Signed)
SX's started on SAt. With CP and Vomiting

## 2021-01-23 NOTE — H&P (Signed)
Date: 01/23/2021               Patient Name:  Whitney Benton MRN: [id number]  DOB: 04/14/1971 Age / Sex: 50 y.o., female   PCP: Ladell Pier, MD         Medical Service: Internal Medicine Teaching Service         Attending Physician: Dr. Aldine Contes, MD    First Contact: Dr. Iona Beard Pager: [phone]  Second Contact: Dr. Harvie Heck Pager: [phone]       After Hours (After 5p/  First Contact Pager: [phone]  weekends / holidays): Second Contact Pager: [phone]   Chief Complaint: n/v  History of Present Illness:  Whitney Benton is a 50 year old female with a PMHx of T2DM, anxiety, depression, hyperlipidemia, and migraines who presents for nausea and vomiting that began 3 days ago. States after eating a meal she started to feel nauseated and subsequently developed continuous vomiting all day long. Has not been able to tolerate food or liquids since then. Tried Zofran which he had at home without improvement. Has not taken any of her medications or insulin as she could not keep anything down and worried about giving insulin since she was not eating. Notes associated epigastric pain and midsternal chest pain when vomiting, and some dizziness, and  increased fatigue. Denies fever, chills, palpitations, hematemesis, constipation, diarrhea, dysuria, or syncope. Reports glucose at home around 130s usually but does recall recent CBG in 300s recently. Denies running out of any of her medications. States she was admitted for hospitalized in 2020 for DKA around the time her father passed away as she was busy taking care of family and did not have time to take care of her medical needs.  ED Course: CBG >600-->378, Na 131, K 7.2, BUN 18, Cr 1.67, anion gap 25, WBC 18.7, pH 7.0, pCo2 41.4, PO2 125, bicarb 12.4, hgb 16.3, b-hydroxybuterate 7.76 troponin 6, UA with >500 glucose, ketones, and protein. Covid negative  Meds:  Current Meds  Medication Sig  . atorvastatin (LIPITOR) 20 MG  tablet TAKE 1 TABLET (20 MG TOTAL) BY MOUTH DAILY.  Marland Kitchen Blood Glucose Monitoring Suppl (TRUE METRIX METER) w/Device KIT Check blood sugars three times a day  . cyclobenzaprine (FLEXERIL) 10 MG tablet Take 1 tablet (10 mg total) by mouth at bedtime. Must have office visit for refills  . DULoxetine (CYMBALTA) 60 MG capsule TAKE 1 CAPSULE (60 MG TOTAL) BY MOUTH DAILY.  Marland Kitchen glucose blood test strip Check blood sugars three times a day  . hydrOXYzine (ATARAX/VISTARIL) 50 MG tablet TAKE 1 TABLET (50 MG TOTAL) BY MOUTH AT BEDTIME.  . insulin glargine (LANTUS SOLOSTAR) 100 UNIT/ML Solostar Pen Inject 56 Units into the skin at bedtime. (Patient taking differently: Inject 52 Units into the skin at bedtime.)  . Insulin Pen Needle (PEN NEEDLES) 31G X 6 MM MISC Use as directed  . melatonin 3 MG TABS tablet Take 1 tablet (3 mg total) by mouth at bedtime.  . Multiple Vitamin (MULTIVITAMIN ADULT) TABS Take 1 tablet by mouth daily.  . nortriptyline (PAMELOR) 25 MG capsule TAKE 1 CAPSULE (25 MG TOTAL) BY MOUTH AT BEDTIME.  Marland Kitchen NOVOLOG FLEXPEN 100 UNIT/ML FlexPen INJECT 22 UNITS BEFORE BREAKFAST, 12 UNITS BEFORE LUNCH, AND 22 UNITS BEFORE DINNER. (Patient taking differently: Inject 12-22 Units into the skin See admin instructions. Inject 22 units subcutaneous before breakfast, 12 units subcutaneous before lunch, and 22 units subcutaneous before dinner.)  .  ondansetron (ZOFRAN ODT) 4 MG disintegrating tablet Take 1 tablet (4 mg total) by mouth every 8 (eight) hours as needed for nausea or vomiting.  . rizatriptan (MAXALT) 10 MG tablet Take 1 tablet (10 mg total) by mouth as needed for migraine (May repeat in 2 hours.  maximum 2 tablets in 24 hours.). May repeat in 2 hours if needed  . topiramate (TOPAMAX) 100 MG tablet Take 1 tablet (100 mg total) by mouth at bedtime.  . TRUEPLUS LANCETS 28G MISC Check blood sugars three time a day  . vitamin B-12 (CYANOCOBALAMIN) 100 MCG tablet Take 100 mcg by mouth daily.     Allergies: Allergies as of 01/23/2021 - Review Complete 01/23/2021  Allergen Reaction Noted  . Aspirin Hives 09/06/2011  . Oxycodone Nausea And Vomiting 12/18/2017   Past Medical History:  Diagnosis Date  . Anxiety   . Depression   . Diabetes mellitus without complication ([place])   . Trichomonas infection     Family History: Father deceased with history of colon cancer, diabetes, liver cirrhosis, and hypertension. Mother with HTN and diabetes  Social History: Lives at home with son and boyfriend. Former smoker stopped in the last year with 5 pack year history. Denies alcohol use  Review of Systems: A complete ROS was negative except as per HPI.   Physical Exam: Blood pressure 130/86, pulse (!) 134, temperature 98 F (36.7 C), temperature source Oral, resp. rate (!) 21, height 5' 3"  (1.6 m), weight 96.6 kg, SpO2 100 %. Physical Exam Constitutional:      Appearance: Normal appearance. She is obese.  HENT:     Head: Normocephalic and atraumatic.     Right Ear: External ear normal.     Left Ear: External ear normal.     Nose: Nose normal. No congestion.     Mouth/Throat:     Mouth: Mucous membranes are moist.     Pharynx: Oropharynx is clear.  Eyes:     Extraocular Movements: Extraocular movements intact.     Pupils: Pupils are equal, round, and reactive to light.  Cardiovascular:     Rate and Rhythm: Regular rhythm. Tachycardia present.     Pulses: Normal pulses.     Heart sounds: No murmur heard.   Pulmonary:     Effort: Pulmonary effort is normal. No respiratory distress.     Breath sounds: No wheezing or rales.  Abdominal:     General: Abdomen is flat.     Tenderness: There is abdominal tenderness (mile epigastric).  Musculoskeletal:        General: No swelling. Normal range of motion.     Cervical back: Normal range of motion and neck supple.  Skin:    General: Skin is warm and dry.     Capillary Refill: Capillary refill takes less than 2 seconds.   Neurological:     General: No focal deficit present.     Mental Status: She is alert. Mental status is at baseline.  Psychiatric:        Mood and Affect: Mood normal.        Behavior: Behavior normal.    EKG: personally reviewed my interpretation is sinus tachycardia with rate of 120, peaked t waves  CXR: personally reviewed my interpretation is mild hypoinflation with atelectasis of the right lung base, no edema, no effusion  Assessment & Plan by Problem: Active Problems:   DKA (diabetic ketoacidosis) ([place])  DKA  Hyperglycemia Patient presenting with nausea, vomiting, and hyperglycemia for the past 3  days. Likely due to medication nonadherent. Went to urgent care today with glucose >600 prior and sent to ED with Na 131, K 7.2, anion gap 15, pH 7, and beta hydroxy buterate 7.76. Started on IV insulin, 2 L bolus, and LR 125 ml in the ED. CBG have been improving in the ED now in 370s.  -IV insulin infusion -LR 2 L bolus, LR 125 ml/hr switched to D5 LR once glucose <250 -NPO -Monitor CBG -BMP q4  Uncontrolled insulin dependent diabetes mellitus Patient has not been taking any medication and not using insulin due to nausea and vomiting. On lantus 56 units nightly, and novolog 22 units morning and evening and 12 units a lunch time. A1c 17 on admission -IV insulin  Acute kidney injury Cr of 1.67 on presentation, baseline ~0.9 improved with IV fluids to 1.00 - Monitor BMP - I/os  Chest pain Patient presents with intermittent CP and palpitations with vomiting. On exam has reproducible tenderness of epigastrium and midsternal area. Troponin negative, EKG with sinus tachycardia sp calcium gluconate due to concerned for peaked T waves. Hyperkalemia likely due to DKA. CP and epigastric pain likely secondary to prolonged vomiting.  Migraines History of difficult to control migraine. On rizatriptan and nortriptyline 25 mg at bedtime at home.  Hyperlipidemia On atorvastatin 40 mg daily at  home   Depression On duloxetine 60 mg daily at home  Diet: NPO VTE: Lovenox Fluids: LR 125 ml/hr Code: Full  Dispo: Admit patient to Inpatient with expected length of stay greater than 2 midnights.  Signed: Iona Beard, MD 01/23/2021, 3:42 PM  Pager: [phone] After 5pm on weekdays and 1pm on weekends: On Call pager: [phone]

## 2021-01-23 NOTE — ED Notes (Signed)
1441 next bs

## 2021-01-23 NOTE — ED Notes (Signed)
Per endo tool 19 units is the amount schedule next check is 1514

## 2021-01-23 NOTE — ED Notes (Addendum)
Pt's blood sugar above 250 x 3, paused D5LR until next blood sugar check.

## 2021-01-23 NOTE — ED Notes (Signed)
Pt bs was 511 at 13:40

## 2021-01-23 NOTE — Progress Notes (Signed)
Inpatient Diabetes Program Recommendations  AACE/ADA: New Consensus Statement on Inpatient Glycemic Control (2015)  Target Ranges:  Prepandial:   less than 140 mg/dL      Peak postprandial:   less than 180 mg/dL (1-2 hours)      Critically ill patients:  140 - 180 mg/dL   Results for Whitney Benton, Whitney Benton (MRN [id number]) as of 01/23/2021 13:13  Ref. Range 01/23/2021 12:06  Sodium Latest Ref Range: 135 - 145 mmol/L 134 (L)  Potassium Latest Ref Range: 3.5 - 5.1 mmol/L 6.9 (HH)  Chloride Latest Ref Range: 98 - 111 mmol/L 99  CO2 Latest Ref Range: 22 - 32 mmol/L 10 (L)  Glucose Latest Ref Range: 70 - 99 mg/dL 635 (HH)  BUN Latest Ref Range: 6 - 20 mg/dL 18  Creatinine Latest Ref Range: 0.44 - 1.00 mg/dL 1.67 (H)  Calcium Latest Ref Range: 8.9 - 10.3 mg/dL 9.7  Anion gap Latest Ref Range: 5 - 15  25 (H)   Results for Whitney Benton, Whitney Benton (MRN [id number]) as of 01/23/2021 13:13  Ref. Range 01/23/2021 10:12 01/23/2021 10:43  Glucose-Capillary Latest Ref Range: 70 - 99 mg/dL >600 (HH) >600 (HH)  IV Insulin Drip started at 12:53pm    Admit with: DKA (has not been taking insulin due to Nausea and Vomiting)  History: DM  Home DM Meds: Lantus 52 units QHS       Novolog 22 units w/ Breakfast/ 12 units w/ Lunch/ 22 units w/ Dinner  Current Orders: IV Insulin Drip    Please keep pt on the IV Insulin Drip until Anion Gap is 12 or less and CO2 level 20 or higher.  When pt finally ready to transition to SQ Insulin, please make sure pt receives Lantus (at least 80% total home dose) at least 1 hour prior to d/c of the IV Insulin    --Will follow patient during hospitalization--  Wyn Quaker RN, MSN, CDE Diabetes Coordinator Inpatient Glycemic Control Team Team Pager: [phone] (8a-5p)

## 2021-01-24 DIAGNOSIS — R739 Hyperglycemia, unspecified: Secondary | ICD-10-CM

## 2021-01-24 LAB — CBG MONITORING, ED
Glucose-Capillary: 109 mg/dL — ABNORMAL HIGH (ref 70–99)
Glucose-Capillary: 133 mg/dL — ABNORMAL HIGH (ref 70–99)
Glucose-Capillary: 143 mg/dL — ABNORMAL HIGH (ref 70–99)
Glucose-Capillary: 158 mg/dL — ABNORMAL HIGH (ref 70–99)
Glucose-Capillary: 168 mg/dL — ABNORMAL HIGH (ref 70–99)
Glucose-Capillary: 187 mg/dL — ABNORMAL HIGH (ref 70–99)
Glucose-Capillary: 187 mg/dL — ABNORMAL HIGH (ref 70–99)
Glucose-Capillary: 197 mg/dL — ABNORMAL HIGH (ref 70–99)
Glucose-Capillary: 205 mg/dL — ABNORMAL HIGH (ref 70–99)
Glucose-Capillary: 207 mg/dL — ABNORMAL HIGH (ref 70–99)
Glucose-Capillary: 207 mg/dL — ABNORMAL HIGH (ref 70–99)
Glucose-Capillary: 213 mg/dL — ABNORMAL HIGH (ref 70–99)
Glucose-Capillary: 215 mg/dL — ABNORMAL HIGH (ref 70–99)
Glucose-Capillary: 235 mg/dL — ABNORMAL HIGH (ref 70–99)
Glucose-Capillary: 241 mg/dL — ABNORMAL HIGH (ref 70–99)
Glucose-Capillary: 241 mg/dL — ABNORMAL HIGH (ref 70–99)
Glucose-Capillary: 257 mg/dL — ABNORMAL HIGH (ref 70–99)
Glucose-Capillary: 257 mg/dL — ABNORMAL HIGH (ref 70–99)
Glucose-Capillary: 267 mg/dL — ABNORMAL HIGH (ref 70–99)
Glucose-Capillary: 49 mg/dL — ABNORMAL LOW (ref 70–99)

## 2021-01-24 LAB — BASIC METABOLIC PANEL
Anion gap: 13 (ref 5–15)
Anion gap: 11 (ref 5–15)
Anion gap: 12 (ref 5–15)
Anion gap: 13 (ref 5–15)
Anion gap: 8 (ref 5–15)
BUN: 12 mg/dL (ref 6–20)
BUN: 11 mg/dL (ref 6–20)
BUN: 10 mg/dL (ref 6–20)
BUN: 9 mg/dL (ref 6–20)
BUN: 11 mg/dL (ref 6–20)
CO2: 15 mmol/L — ABNORMAL LOW (ref 22–32)
CO2: 16 mmol/L — ABNORMAL LOW (ref 22–32)
CO2: 18 mmol/L — ABNORMAL LOW (ref 22–32)
CO2: 17 mmol/L — ABNORMAL LOW (ref 22–32)
CO2: 20 mmol/L — ABNORMAL LOW (ref 22–32)
Calcium: 9.2 mg/dL (ref 8.9–10.3)
Calcium: 7.7 mg/dL — ABNORMAL LOW (ref 8.9–10.3)
Calcium: 9.1 mg/dL (ref 8.9–10.3)
Calcium: 9 mg/dL (ref 8.9–10.3)
Calcium: 8.8 mg/dL — ABNORMAL LOW (ref 8.9–10.3)
Chloride: 106 mmol/L (ref 98–111)
Chloride: 109 mmol/L (ref 98–111)
Chloride: 108 mmol/L (ref 98–111)
Chloride: 105 mmol/L (ref 98–111)
Chloride: 110 mmol/L (ref 98–111)
Creatinine, Ser: 1.04 mg/dL — ABNORMAL HIGH (ref 0.44–1.00)
Creatinine, Ser: 0.85 mg/dL (ref 0.44–1.00)
Creatinine, Ser: 0.91 mg/dL (ref 0.44–1.00)
Creatinine, Ser: 0.92 mg/dL (ref 0.44–1.00)
Creatinine, Ser: 1.12 mg/dL — ABNORMAL HIGH (ref 0.44–1.00)
GFR, Estimated: >60 mL/min (ref >60)
GFR, Estimated: >60 mL/min (ref >60)
GFR, Estimated: >60 mL/min (ref >60)
GFR, Estimated: >60 mL/min (ref >60)
GFR, Estimated: 60 mL/min — ABNORMAL LOW (ref >60)
Glucose, Bld: 199 mg/dL — ABNORMAL HIGH (ref 70–99)
Glucose, Bld: 167 mg/dL — ABNORMAL HIGH (ref 70–99)
Glucose, Bld: 134 mg/dL — ABNORMAL HIGH (ref 70–99)
Glucose, Bld: 312 mg/dL — ABNORMAL HIGH (ref 70–99)
Glucose, Bld: 146 mg/dL — ABNORMAL HIGH (ref 70–99)
Potassium: 4.2 mmol/L (ref 3.5–5.1)
Potassium: 4 mmol/L (ref 3.5–5.1)
Potassium: 4 mmol/L (ref 3.5–5.1)
Potassium: 4 mmol/L (ref 3.5–5.1)
Potassium: 3.4 mmol/L — ABNORMAL LOW (ref 3.5–5.1)
Sodium: 134 mmol/L — ABNORMAL LOW (ref 135–145)
Sodium: 136 mmol/L (ref 135–145)
Sodium: 138 mmol/L (ref 135–145)
Sodium: 135 mmol/L (ref 135–145)
Sodium: 138 mmol/L (ref 135–145)

## 2021-01-24 LAB — BETA-HYDROXYBUTYRIC ACID
Beta-Hydroxybutyric Acid: 1.19 mmol/L — ABNORMAL HIGH (ref 0.05–0.27)
Beta-Hydroxybutyric Acid: 3.54 mmol/L — ABNORMAL HIGH (ref 0.05–0.27)
Beta-Hydroxybutyric Acid: 0.36 mmol/L — ABNORMAL HIGH (ref 0.05–0.27)

## 2021-01-24 LAB — HIV ANTIBODY (ROUTINE TESTING W REFLEX): HIV Screen 4th Generation wRfx: NONREACTIVE

## 2021-01-24 MED ORDER — INSULIN ASPART 100 UNIT/ML ~~LOC~~ SOLN
0.0000 [IU] | Freq: Every day | SUBCUTANEOUS | Status: DC
  Administered 2021-01-24: 2 [IU] via SUBCUTANEOUS

## 2021-01-24 MED ORDER — INSULIN GLARGINE 100 UNIT/ML ~~LOC~~ SOLN
42.0000 [IU] | SUBCUTANEOUS | Status: DC
  Administered 2021-01-24: 42 [IU] via SUBCUTANEOUS
  Filled 2021-01-24: qty 0.42

## 2021-01-24 MED ORDER — SODIUM CHLORIDE 0.9 % IV BOLUS
1000.0000 mL | Freq: Once | INTRAVENOUS | Status: AC
  Administered 2021-01-24: 1000 mL via INTRAVENOUS

## 2021-01-24 MED ORDER — SUMATRIPTAN SUCCINATE 50 MG PO TABS
50.0000 mg | ORAL_TABLET | ORAL | Status: DC | PRN
  Administered 2021-01-24 – 2021-01-26 (×4): 50 mg via ORAL
  Filled 2021-01-24 (×6): qty 1

## 2021-01-24 MED ORDER — INSULIN ASPART 100 UNIT/ML ~~LOC~~ SOLN: 0.0000 [IU] | Freq: Every day | SUBCUTANEOUS | Status: DC

## 2021-01-24 MED ORDER — SUMATRIPTAN SUCCINATE 100 MG PO TABS
100.0000 mg | ORAL_TABLET | ORAL | Status: DC | PRN
  Filled 2021-01-24: qty 1

## 2021-01-24 MED ORDER — NORTRIPTYLINE HCL 25 MG PO CAPS
25.0000 mg | ORAL_CAPSULE | Freq: Every day | ORAL | Status: DC
  Administered 2021-01-24 – 2021-01-25 (×2): 25 mg via ORAL
  Filled 2021-01-24 (×4): qty 1

## 2021-01-24 MED ORDER — LACTATED RINGERS IV BOLUS: 1000.0000 mL | Freq: Once | INTRAVENOUS | Status: DC

## 2021-01-24 MED ORDER — INSULIN GLARGINE 100 UNIT/ML ~~LOC~~ SOLN
42.0000 [IU] | Freq: Once | SUBCUTANEOUS | Status: DC
  Filled 2021-01-24: qty 0.42

## 2021-01-24 MED ORDER — INSULIN GLARGINE 100 UNIT/ML ~~LOC~~ SOLN: 42.0000 [IU] | Freq: Once | SUBCUTANEOUS | Status: DC

## 2021-01-24 MED ORDER — INSULIN ASPART 100 UNIT/ML ~~LOC~~ SOLN: 0.0000 [IU] | Freq: Three times a day (TID) | SUBCUTANEOUS | Status: DC

## 2021-01-24 NOTE — ED Notes (Signed)
This RN spoke to provider. Provider states to pause current infusion, give 1L NS and then restart d5 in LR.

## 2021-01-24 NOTE — ED Notes (Signed)
Per provider, Ok to start endotool back.

## 2021-01-24 NOTE — ED Notes (Addendum)
Dr. Lisabeth Devoid advised patient will remain on insulin drip at this time until beta-hydroxybutyric acid is WNL then pt will have diet order placed and be transitioned to lantus, will remain NPO at this time. Patient updated on plan of care.

## 2021-01-24 NOTE — ED Notes (Signed)
Checked patient cbg it was 73 notified RN of blood sugar patient is resting with call bell in reach

## 2021-01-24 NOTE — ED Notes (Addendum)
Dr. Lisabeth Devoid made aware of blood sugar drop on endo tool. MD advised to restart endo tool if pts cbg remains >140 upon second recheck and that she will place order for carb mod diet.

## 2021-01-24 NOTE — ED Notes (Signed)
CBG recheck after D50 215

## 2021-01-24 NOTE — ED Notes (Signed)
Upon recheck of CBG for endo tool, CBG 49, patient given D50 and D5in LR restarted. Pt remains alert/oriented but drowsy.

## 2021-01-24 NOTE — Progress Notes (Addendum)
HD#0 Subjective:  Overnight Events: None  Patient states that she is feeling ok, denies vomiting.   States that her HR has been high in the 110s whenever she measured her BP, unknown when it started but states that started after her father died in 2019/04/08. The tachycardia made her more tired.   Objective:  Vital signs in last 24 hours: Vitals:   01/23/21 1700 01/23/21 04-07-14 01/24/21 0115 01/24/21 0215  BP: (!) 138/91 (!) 129/93 125/85 (!) 137/94  Pulse: (!) 132 (!) 126 (!) 118 (!) 117  Resp: (!) 22 (!) 25 18 (!) 25  Temp:   98.1 F (36.7 C)   TempSrc:      SpO2: 100% 96% 99% 97%  Weight:      Height:       Supplemental O2: Room Air SpO2: 97 %   Physical Exam:  Constitution: Resting in bed, in no acute distress  Pulmonary/Chest: CTA, no wheezing, no increased work of breathing Cardiac: tachycardic, regular rhythmn, normal S1, S2 no murmurs Abdominal: Soft, non-tender, non-distended Neurological: Alert oriented x3  Filed Weights   01/23/21 1200  Weight: 96.6 kg    No intake or output data in the 24 hours ending 01/24/21 0600 Net IO Since Admission: No IO data has been entered for this period [01/24/21 0600]  Pertinent Labs: CBC Latest Ref Rng & Units 01/23/2021 01/23/2021 01/23/2021  WBC 4.0 - 10.5 K/uL - - 18.7(H)  Hemoglobin 12.0 - 15.0 g/dL 18.4(H) 18.0(H) 16.3(H)  Hematocrit 36.0 - 46.0 % 54.0(H) 53.0(H) 52.2(H)  Platelets 150 - 400 K/uL - - 517(H)    CMP Latest Ref Rng & Units 01/24/2021 01/23/2021 01/23/2021  Glucose 70 - 99 mg/dL 199(H) 298(H) 243(H)  BUN 6 - 20 mg/dL 12 13 13   Creatinine 0.44 - 1.00 mg/dL 1.04(H) 1.34(H) 1.29(H)  Sodium 135 - 145 mmol/L 134(L) 132(L) 136  Potassium 3.5 - 5.1 mmol/L 4.2 4.3 4.6  Chloride 98 - 111 mmol/L 106 106 106  CO2 22 - 32 mmol/L 15(L) 11(L) 13(L)  Calcium 8.9 - 10.3 mg/dL 9.2 8.8(L) 9.3  Total Protein 6.5 - 8.1 g/dL - - -  Total Bilirubin 0.3 - 1.2 mg/dL - - -  Alkaline Phos 38 - 126 U/L - - -  AST 15 - 41 U/L - - -   ALT 0 - 44 U/L - - -    Imaging: DG Chest Port 1 View  Result Date: 01/23/2021 CLINICAL DATA:  Chest pain. EXAM: PORTABLE CHEST 1 VIEW COMPARISON:  Chest radiograph and CTA 08/10/2020 FINDINGS: The cardiomediastinal silhouette is unchanged with normal heart size. The lungs are mildly hypoinflated with mild platelike atelectasis in the right lung base. No edema, pleural effusion, pneumothorax is identified. No acute osseous abnormality is seen. IMPRESSION: No active disease. Electronically Signed   By: Logan Bores M.D.   On: 01/23/2021 11:31    Assessment/Plan:   Active Problems:   DKA (diabetic ketoacidosis) ([place])   Patient Summary: Whitney Benton is a 50 y.o. with a pertinent PMH of T2DM, hyperlipidemia, migraines, depression, who presented with nausea and vomiting and admitted for DKA.   DKA  Hyperglycemia Uncontrolled insulin dependent diabetes mellitus Presented with nausea and vomiting and hyperglycemia and DKA in the setting of medication nonadherent. This morning no longer having nause or vomiting and feeling better. On lantus 56 units nightly, and novolog 22 units morning and evening and 12 units a lunch time. A1c 17 on admission.Overnight anion gap closed x 2  beta hydroxybutyrate, remains elevated this morning.  -Repeat beta hydroxybutyrate this afternoon, if normal will transition to sq insulin - Insulin gtt -Monitor CBG -NPO -D5 LR 125 ml/hr   Acute kidney injury Cr stable at 1.04 this morning likely prerenal in setting of DKA. Reports good urine output. Appears euvolemic today. - Monitor BMP - I/Os  Sinus tachycardia Patient reports elevated HR recent with BP reading normally around 110-120 was planning to talk to PCP about this just prior to current admission. On chart review appears she has had tachycardia since 2020. EKG with sinus rhythm. She is asymptomatic. Will need to outpatient follow up to evaluate this further.  Migraines History of difficult to control  migraine. On rizatriptan and nortriptyline 25 mg at bedtime at home. -Restart home meds  Diet: NPO IVF: LR,125cc/hr VTE: Enoxaparin Code: Full TOC recs: pending  Dispo: Anticipated discharge to Home tomorrow.  Iona Beard, MD 01/24/2021, 6:00 AM Pager: [phone]  Please contact the on call pager after 5 pm and on weekends at [phone].

## 2021-01-24 NOTE — ED Notes (Signed)
Checked patient cbg it was 28 notified RN of blood sugar patient is resting with call bell in reach

## 2021-01-24 NOTE — ED Notes (Signed)
Admitting physician made aware of patient's c/o headache.

## 2021-01-25 LAB — BASIC METABOLIC PANEL
Anion gap: 11 (ref 5–15)
Anion gap: 12 (ref 5–15)
Anion gap: 11 (ref 5–15)
Anion gap: 10 (ref 5–15)
Anion gap: 9 (ref 5–15)
BUN: 11 mg/dL (ref 6–20)
BUN: 11 mg/dL (ref 6–20)
BUN: 10 mg/dL (ref 6–20)
BUN: 8 mg/dL (ref 6–20)
BUN: 6 mg/dL (ref 6–20)
CO2: 19 mmol/L — ABNORMAL LOW (ref 22–32)
CO2: 18 mmol/L — ABNORMAL LOW (ref 22–32)
CO2: 21 mmol/L — ABNORMAL LOW (ref 22–32)
CO2: 23 mmol/L (ref 22–32)
CO2: 22 mmol/L (ref 22–32)
Calcium: 8.5 mg/dL — ABNORMAL LOW (ref 8.9–10.3)
Calcium: 8.4 mg/dL — ABNORMAL LOW (ref 8.9–10.3)
Calcium: 9 mg/dL (ref 8.9–10.3)
Calcium: 8.5 mg/dL — ABNORMAL LOW (ref 8.9–10.3)
Calcium: 8.3 mg/dL — ABNORMAL LOW (ref 8.9–10.3)
Chloride: 109 mmol/L (ref 98–111)
Chloride: 106 mmol/L (ref 98–111)
Chloride: 107 mmol/L (ref 98–111)
Chloride: 107 mmol/L (ref 98–111)
Chloride: 107 mmol/L (ref 98–111)
Creatinine, Ser: 1 mg/dL (ref 0.44–1.00)
Creatinine, Ser: 0.92 mg/dL (ref 0.44–1.00)
Creatinine, Ser: 0.81 mg/dL (ref 0.44–1.00)
Creatinine, Ser: 0.71 mg/dL (ref 0.44–1.00)
Creatinine, Ser: 0.69 mg/dL (ref 0.44–1.00)
GFR, Estimated: >60 mL/min (ref >60)
GFR, Estimated: >60 mL/min (ref >60)
GFR, Estimated: >60 mL/min (ref >60)
GFR, Estimated: >60 mL/min (ref >60)
GFR, Estimated: >60 mL/min (ref >60)
Glucose, Bld: 239 mg/dL — ABNORMAL HIGH (ref 70–99)
Glucose, Bld: 289 mg/dL — ABNORMAL HIGH (ref 70–99)
Glucose, Bld: 126 mg/dL — ABNORMAL HIGH (ref 70–99)
Glucose, Bld: 158 mg/dL — ABNORMAL HIGH (ref 70–99)
Glucose, Bld: 192 mg/dL — ABNORMAL HIGH (ref 70–99)
Potassium: 3.5 mmol/L (ref 3.5–5.1)
Potassium: 3.5 mmol/L (ref 3.5–5.1)
Potassium: 3.1 mmol/L — ABNORMAL LOW (ref 3.5–5.1)
Potassium: 3.1 mmol/L — ABNORMAL LOW (ref 3.5–5.1)
Potassium: 3 mmol/L — ABNORMAL LOW (ref 3.5–5.1)
Sodium: 139 mmol/L (ref 135–145)
Sodium: 136 mmol/L (ref 135–145)
Sodium: 139 mmol/L (ref 135–145)
Sodium: 140 mmol/L (ref 135–145)
Sodium: 138 mmol/L (ref 135–145)

## 2021-01-25 LAB — GLUCOSE, CAPILLARY
Glucose-Capillary: 112 mg/dL — ABNORMAL HIGH (ref 70–99)
Glucose-Capillary: 182 mg/dL — ABNORMAL HIGH (ref 70–99)
Glucose-Capillary: 139 mg/dL — ABNORMAL HIGH (ref 70–99)
Glucose-Capillary: 149 mg/dL — ABNORMAL HIGH (ref 70–99)
Glucose-Capillary: 194 mg/dL — ABNORMAL HIGH (ref 70–99)
Glucose-Capillary: 174 mg/dL — ABNORMAL HIGH (ref 70–99)
Glucose-Capillary: 177 mg/dL — ABNORMAL HIGH (ref 70–99)

## 2021-01-25 LAB — CBC
HCT: 36.2 % (ref 36.0–46.0)
Hemoglobin: 12.5 g/dL (ref 12.0–15.0)
MCH: 29.3 pg (ref 26.0–34.0)
MCHC: 34.5 g/dL (ref 30.0–36.0)
MCV: 85 fL (ref 80.0–100.0)
Platelets: 303 10*3/uL (ref 150–400)
RBC: 4.26 MIL/uL (ref 3.87–5.11)
RDW: 14 % (ref 11.5–15.5)
WBC: 9.9 10*3/uL (ref 4.0–10.5)
nRBC: 0 % (ref 0.0–0.2)

## 2021-01-25 LAB — CBG MONITORING, ED
Glucose-Capillary: 125 mg/dL — ABNORMAL HIGH (ref 70–99)
Glucose-Capillary: 167 mg/dL — ABNORMAL HIGH (ref 70–99)
Glucose-Capillary: 189 mg/dL — ABNORMAL HIGH (ref 70–99)
Glucose-Capillary: 215 mg/dL — ABNORMAL HIGH (ref 70–99)
Glucose-Capillary: 280 mg/dL — ABNORMAL HIGH (ref 70–99)
Glucose-Capillary: 334 mg/dL — ABNORMAL HIGH (ref 70–99)

## 2021-01-25 LAB — BETA-HYDROXYBUTYRIC ACID
Beta-Hydroxybutyric Acid: 2.41 mmol/L — ABNORMAL HIGH (ref 0.05–0.27)
Beta-Hydroxybutyric Acid: 3.22 mmol/L — ABNORMAL HIGH (ref 0.05–0.27)
Beta-Hydroxybutyric Acid: 0.61 mmol/L — ABNORMAL HIGH (ref 0.05–0.27)
Beta-Hydroxybutyric Acid: 0.52 mmol/L — ABNORMAL HIGH (ref 0.05–0.27)

## 2021-01-25 MED ORDER — POTASSIUM CHLORIDE 10 MEQ/100ML IV SOLN
10.0000 meq | INTRAVENOUS | Status: AC
  Administered 2021-01-25 (×4): 10 meq via INTRAVENOUS
  Filled 2021-01-25 (×4): qty 100

## 2021-01-25 MED ORDER — DEXTROSE IN LACTATED RINGERS 5 % IV SOLN: INTRAVENOUS | Status: DC

## 2021-01-25 MED ORDER — INSULIN REGULAR(HUMAN) IN NACL 100-0.9 UT/100ML-% IV SOLN
INTRAVENOUS | Status: DC
  Administered 2021-01-25: 5.5 [IU]/h via INTRAVENOUS
  Administered 2021-01-25: 4.6 [IU]/h via INTRAVENOUS
  Administered 2021-01-25: 3 [IU]/h via INTRAVENOUS
  Administered 2021-01-25: 4 [IU]/h via INTRAVENOUS
  Administered 2021-01-25: 18 [IU]/h via INTRAVENOUS
  Administered 2021-01-26: 2.2 [IU]/h via INTRAVENOUS
  Administered 2021-01-26: 1.1 [IU]/h via INTRAVENOUS
  Administered 2021-01-26: 0.6 [IU]/h via INTRAVENOUS
  Administered 2021-01-26: 1.1 [IU]/h via INTRAVENOUS
  Administered 2021-01-26: 4.3 [IU]/h via INTRAVENOUS
  Filled 2021-01-25 (×3): qty 100

## 2021-01-25 MED ORDER — INSULIN ASPART 100 UNIT/ML ~~LOC~~ SOLN
0.0000 [IU] | SUBCUTANEOUS | Status: DC
  Administered 2021-01-25: 11 [IU] via SUBCUTANEOUS

## 2021-01-25 MED ORDER — LACTATED RINGERS IV BOLUS: 20.0000 mL/kg | Freq: Once | INTRAVENOUS | Status: DC

## 2021-01-25 MED ORDER — LACTATED RINGERS IV SOLN: INTRAVENOUS | Status: DC

## 2021-01-25 MED ORDER — DEXTROSE 50 % IV SOLN: 0.0000 mL | INTRAVENOUS | Status: DC | PRN

## 2021-01-25 NOTE — Progress Notes (Signed)
HD#1 Subjective:  Overnight Events: None   Patient complains of headache and just took her med. She denies nausea/vomiting. Patient ate a little bit yesterday. Reports she is urinating well.  Objective:  Vital signs in last 24 hours: Vitals:   01/25/21 0122 01/25/21 0200 01/25/21 0259 01/25/21 0600  BP: 133/76 122/75 122/78 129/79  Pulse: (!) 108 (!) 110 (!) 106 (!) 102  Resp: (!) 22 (!) 21 (!) 21 (!) 21  Temp:      TempSrc:      SpO2: 97% 99% 98% 98%  Weight:      Height:       Supplemental O2: Room Air SpO2: 98 %   Physical Exam:  Constitution: Resting in bed, in no acute distress  Pulmonary/Chest: CTA, no wheezing, no increased work of breathing Cardiac: tachycardic, regular rhythmn, normal S1, S2 no murmurs Abdominal: Soft, non-tender, non-distended Neurological: Alert oriented x3  Filed Weights   01/23/21 1200  Weight: 96.6 kg    No intake or output data in the 24 hours ending 01/25/21 0812 Net IO Since Admission: No IO data has been entered for this period [01/25/21 0812]  Pertinent Labs: CBC Latest Ref Rng & Units 01/25/2021 01/23/2021 01/23/2021  WBC 4.0 - 10.5 K/uL 9.9 - -  Hemoglobin 12.0 - 15.0 g/dL 12.5 18.4(H) 18.0(H)  Hematocrit 36.0 - 46.0 % 36.2 54.0(H) 53.0(H)  Platelets 150 - 400 K/uL 303 - -    CMP Latest Ref Rng & Units 01/25/2021 01/24/2021 01/24/2021  Glucose 70 - 99 mg/dL 239(H) 146(H) 312(H)  BUN 6 - 20 mg/dL 11 11 9   Creatinine 0.44 - 1.00 mg/dL 1.00 1.12(H) 0.92  Sodium 135 - 145 mmol/L 139 138 135  Potassium 3.5 - 5.1 mmol/L 3.5 3.4(L) 4.0  Chloride 98 - 111 mmol/L 109 110 105  CO2 22 - 32 mmol/L 19(L) 20(L) 17(L)  Calcium 8.9 - 10.3 mg/dL 8.5(L) 8.8(L) 9.0  Total Protein 6.5 - 8.1 g/dL - - -  Total Bilirubin 0.3 - 1.2 mg/dL - - -  Alkaline Phos 38 - 126 U/L - - -  AST 15 - 41 U/L - - -  ALT 0 - 44 U/L - - -    Imaging: No results found.  Assessment/Plan:   Principal Problem:   DKA (diabetic ketoacidosis) ([place]) Active  Problems:   AKI (acute kidney injury) ([place])   Patient Summary: Whitney Benton is a 50 y.o. with a pertinent PMH of T2DM, hyperlipidemia, migraines, depression, who presented with nausea and vomiting and admitted for DKA.   DKA  Hyperglycemia Uncontrolled insulin dependent diabetes mellitus Nausea vomiting resolved. Beta hydroxybutyrate downtrending from 7.76 to 0.36. Started on sq insulin and gtt stopped. However, this morning now up to 2.4. will recheck BMP and beta hydroxybutyrate this morning. If elevated will need to be started back on insulin gtt -BMP, beta hydroxy byuterate -SSI resistant  -Monitor CBG -NPO for now  Acute kidney injury Kidney funct remains stable with Cr of 1.00 this morning - Monitor BMP - I/Os   Migraines History of difficult to control migraine. On rizatriptan and nortriptyline 25 mg at bedtime at home. Has headache this morning and given dose of sumatriptan this morning.  - Sumatriptan 50 mg PRN can give second dose if not improving  - Nortriptyline   Diet: NPO IVF: LR,125cc/hr VTE: Enoxaparin Code: Full TOC recs: pending  Dispo: Anticipated discharge to Home in 0-1 days  Iona Beard, MD 01/25/2021, 8:12 AM Pager: [phone]  Please contact the on call pager after 5 pm and on weekends at [phone].

## 2021-01-25 NOTE — Plan of Care (Signed)
  Problem: Education: Goal: Knowledge of General Education information will improve Description: Including pain rating scale, medication(s)/side effects and non-pharmacologic comfort measures Outcome: Progressing   Problem: Health Behavior/Discharge Planning: Goal: Ability to manage health-related needs will improve Outcome: Progressing   Problem: Clinical Measurements: Goal: Ability to maintain clinical measurements within normal limits will improve Outcome: Progressing Goal: Will remain free from infection Outcome: Progressing Goal: Diagnostic test results will improve Outcome: Progressing Goal: Respiratory complications will improve Outcome: Progressing Goal: Cardiovascular complication will be avoided Outcome: Progressing   Problem: Activity: Goal: Risk for activity intolerance will decrease Outcome: Progressing   Problem: Nutrition: Goal: Adequate nutrition will be maintained Outcome: Progressing   Problem: Coping: Goal: Level of anxiety will decrease Outcome: Progressing   Problem: Elimination: Goal: Will not experience complications related to bowel motility Outcome: Progressing Goal: Will not experience complications related to urinary retention Outcome: Progressing   Problem: Pain Managment: Goal: General experience of comfort will improve Outcome: Progressing   Problem: Safety: Goal: Ability to remain free from injury will improve Outcome: Progressing   Problem: Skin Integrity: Goal: Risk for impaired skin integrity will decrease Outcome: Progressing   Problem: Education: Goal: Ability to describe self-care measures that may prevent or decrease complications (Diabetes Survival Skills Education) will improve Outcome: Progressing   Problem: Metabolic: Goal: Ability to maintain appropriate glucose levels will improve Outcome: Progressing   Problem: Nutritional: Goal: Maintenance of adequate nutrition will improve Outcome: Progressing Goal: Progress  toward achieving an optimal weight will improve Outcome: Progressing

## 2021-01-25 NOTE — ED Notes (Signed)
Breakfast ordered 

## 2021-01-25 NOTE — ED Notes (Signed)
Lab at bedside

## 2021-01-25 NOTE — ED Notes (Signed)
Paged attending for RN 

## 2021-01-25 NOTE — Progress Notes (Signed)
Inpatient Diabetes Program Recommendations  AACE/ADA: New Consensus Statement on Inpatient Glycemic Control (2015)  Target Ranges:  Prepandial:   less than 140 mg/dL      Peak postprandial:   less than 180 mg/dL (1-2 hours)      Critically ill patients:  140 - 180 mg/dL   Lab Results  Component Value Date   GLUCAP 334 (H) 01/25/2021   HGBA1C  06/08/2020     Comment:     >15.0    Review of Glycemic Control Results for Whitney Benton, Whitney Benton (MRN [id number]) as of 01/25/2021 12:45  Ref. Range 01/24/2021 23:24 01/25/2021 00:33 01/25/2021 01:33 01/25/2021 07:16 01/25/2021 11:48  Glucose-Capillary Latest Ref Range: 70 - 99 mg/dL 241 (H) 167 (H) 189 (H) 280 (H) 334 (H)   Diabetes history: DM 2 Outpatient Diabetes medications:  Lantus 52 units daily, Novolog 22 units with breakfast, 12 units lunch, and 22 units with supper Current orders for Inpatient glycemic control:  IV insulin  Inpatient Diabetes Program Recommendations:    Note transition attempted last night however CBG's went back up and Beta-hydroxybutyrate increased.  IV insulin restarted.   Once acidosis cleared, consider adding Lantus 50 units daily (2 hours prior to d/c of insulin drip).  Also consider Novolog moderate q 4 hours.  She will also need Novolog meal coverage 10 units tid with meals (hold if patient eats less than 50%).  Will talk to patient today.    Thanks,  Adah Perl, RN, BC-ADM Inpatient Diabetes Coordinator Pager [phone] (8a-5p)

## 2021-01-26 ENCOUNTER — Ambulatory Visit: Payer: Self-pay | Admitting: Internal Medicine

## 2021-01-26 LAB — BASIC METABOLIC PANEL
Anion gap: 9 (ref 5–15)
Anion gap: 8 (ref 5–15)
Anion gap: 11 (ref 5–15)
BUN: <5 mg/dL — ABNORMAL LOW (ref 6–20)
BUN: <5 mg/dL — ABNORMAL LOW (ref 6–20)
BUN: <5 mg/dL — ABNORMAL LOW (ref 6–20)
CO2: 25 mmol/L (ref 22–32)
CO2: 24 mmol/L (ref 22–32)
CO2: 23 mmol/L (ref 22–32)
Calcium: 8.5 mg/dL — ABNORMAL LOW (ref 8.9–10.3)
Calcium: 8.3 mg/dL — ABNORMAL LOW (ref 8.9–10.3)
Calcium: 8.7 mg/dL — ABNORMAL LOW (ref 8.9–10.3)
Chloride: 107 mmol/L (ref 98–111)
Chloride: 108 mmol/L (ref 98–111)
Chloride: 103 mmol/L (ref 98–111)
Creatinine, Ser: 0.63 mg/dL (ref 0.44–1.00)
Creatinine, Ser: 0.63 mg/dL (ref 0.44–1.00)
Creatinine, Ser: 0.69 mg/dL (ref 0.44–1.00)
GFR, Estimated: >60 mL/min (ref >60)
GFR, Estimated: >60 mL/min (ref >60)
GFR, Estimated: >60 mL/min (ref >60)
Glucose, Bld: 115 mg/dL — ABNORMAL HIGH (ref 70–99)
Glucose, Bld: 133 mg/dL — ABNORMAL HIGH (ref 70–99)
Glucose, Bld: 236 mg/dL — ABNORMAL HIGH (ref 70–99)
Potassium: 2.7 mmol/L — CL (ref 3.5–5.1)
Potassium: 3.1 mmol/L — ABNORMAL LOW (ref 3.5–5.1)
Potassium: 3.3 mmol/L — ABNORMAL LOW (ref 3.5–5.1)
Sodium: 141 mmol/L (ref 135–145)
Sodium: 140 mmol/L (ref 135–145)
Sodium: 137 mmol/L (ref 135–145)

## 2021-01-26 LAB — GLUCOSE, CAPILLARY
Glucose-Capillary: 110 mg/dL — ABNORMAL HIGH (ref 70–99)
Glucose-Capillary: 112 mg/dL — ABNORMAL HIGH (ref 70–99)
Glucose-Capillary: 116 mg/dL — ABNORMAL HIGH (ref 70–99)
Glucose-Capillary: 120 mg/dL — ABNORMAL HIGH (ref 70–99)
Glucose-Capillary: 124 mg/dL — ABNORMAL HIGH (ref 70–99)
Glucose-Capillary: 146 mg/dL — ABNORMAL HIGH (ref 70–99)
Glucose-Capillary: 147 mg/dL — ABNORMAL HIGH (ref 70–99)
Glucose-Capillary: 168 mg/dL — ABNORMAL HIGH (ref 70–99)
Glucose-Capillary: 189 mg/dL — ABNORMAL HIGH (ref 70–99)

## 2021-01-26 LAB — BETA-HYDROXYBUTYRIC ACID: Beta-Hydroxybutyric Acid: 0.11 mmol/L (ref 0.05–0.27)

## 2021-01-26 MED ORDER — INSULIN GLARGINE 100 UNIT/ML ~~LOC~~ SOLN
50.0000 [IU] | SUBCUTANEOUS | Status: DC
  Administered 2021-01-26: 50 [IU] via SUBCUTANEOUS
  Filled 2021-01-26 (×2): qty 0.5

## 2021-01-26 MED ORDER — INSULIN ASPART 100 UNIT/ML ~~LOC~~ SOLN
0.0000 [IU] | Freq: Three times a day (TID) | SUBCUTANEOUS | Status: DC
  Administered 2021-01-26: 4 [IU] via SUBCUTANEOUS

## 2021-01-26 MED ORDER — POTASSIUM CHLORIDE 10 MEQ/100ML IV SOLN
10.0000 meq | INTRAVENOUS | Status: DC
  Administered 2021-01-26 (×5): 10 meq via INTRAVENOUS
  Filled 2021-01-26 (×3): qty 100

## 2021-01-26 MED ORDER — INSULIN ASPART 100 UNIT/ML ~~LOC~~ SOLN
10.0000 [IU] | Freq: Three times a day (TID) | SUBCUTANEOUS | Status: DC
  Administered 2021-01-26: 10 [IU] via SUBCUTANEOUS

## 2021-01-26 MED ORDER — POTASSIUM CHLORIDE 20 MEQ PO PACK
40.0000 meq | PACK | Freq: Two times a day (BID) | ORAL | Status: DC
  Administered 2021-01-26: 40 meq via ORAL
  Filled 2021-01-26: qty 2

## 2021-01-26 NOTE — Progress Notes (Signed)
HD#2 Subjective:  Overnight Events: None  Feeling well this morning. Ate half her omelette this morning, no nausea and vomiting. Follow with [facility]. No acute compliants.   Objective:  Vital signs in last 24 hours: Vitals:   01/25/21 1940 01/25/21 2345 01/26/21 0319 01/26/21 0732  BP: 131/89 131/87 112/70 (!) 131/99  Pulse: 88 90 83 90  Resp: 14 16 18 14   Temp: 99.3 F (37.4 C) 98.7 F (37.1 C) 98.5 F (36.9 C) 98.6 F (37 C)  TempSrc: Oral Oral Oral Oral  SpO2: 99% 99% 98% 100%  Weight:      Height:       Supplemental O2: Room Air SpO2: 100 %   Physical Exam:  Constitution: Resting in bed, in no acute distress  Pulmonary/Chest: CTA, no wheezing, no increased work of breathing Cardiac: tachycardic, regular rhythmn, normal S1, S2 no murmurs Abdominal: Soft, non-tender, non-distended Neurological: Alert oriented x3  Filed Weights   01/23/21 1200  Weight: 96.6 kg     Intake/Output Summary (Last 24 hours) at 01/26/2021 1017 Last data filed at 01/26/2021 0636 Gross per 24 hour  Intake 2874.89 ml  Output 1400 ml  Net 1474.89 ml   Net IO Since Admission: 1,474.89 mL [01/26/21 1017]  Pertinent Labs: CBC Latest Ref Rng & Units 01/25/2021 01/23/2021 01/23/2021  WBC 4.0 - 10.5 K/uL 9.9 - -  Hemoglobin 12.0 - 15.0 g/dL 12.5 18.4(H) 18.0(H)  Hematocrit 36.0 - 46.0 % 36.2 54.0(H) 53.0(H)  Platelets 150 - 400 K/uL 303 - -    CMP Latest Ref Rng & Units 01/26/2021 01/26/2021 01/25/2021  Glucose 70 - 99 mg/dL 133(H) 115(H) 192(H)  BUN 6 - 20 mg/dL <5(L) <5(L) 6  Creatinine 0.44 - 1.00 mg/dL 0.63 0.63 0.69  Sodium 135 - 145 mmol/L 140 141 138  Potassium 3.5 - 5.1 mmol/L 3.1(L) 2.7(LL) 3.0(L)  Chloride 98 - 111 mmol/L 108 107 107  CO2 22 - 32 mmol/L 24 25 22   Calcium 8.9 - 10.3 mg/dL 8.3(L) 8.5(L) 8.3(L)  Total Protein 6.5 - 8.1 g/dL - - -  Total Bilirubin 0.3 - 1.2 mg/dL - - -  Alkaline Phos 38 - 126 U/L - - -  AST 15 - 41 U/L - - -  ALT 0 - 44 U/L - - -    Imaging: No  results found.  Assessment/Plan:   Principal Problem:   DKA (diabetic ketoacidosis) ([place]) Active Problems:   AKI (acute kidney injury) ([place])   Patient Summary: Whitney Benton is a 50 y.o. with a pertinent PMH of T2DM, hyperlipidemia, migraines, depression, who presented with nausea and vomiting and admitted for DKA.   DKA  Hyperglycemia Uncontrolled insulin dependent diabetes mellitus Tolerating diet no nausea and vomiting. betahydroxy butyrate normalized to 0.11 over night. Bicarb of 24, anion gap 8. Transitioned to sq insulin this morning. Will repeat BMP this afternoon if she remains well will discharge home this afternoon.  - Lantus 50 units daily, novolog 10 mg TID with meals  -SSI resistant  -Monitor CBG - Follow up pm BMP  Acute kidney injury- resikved Cr 0.63 this morning stable  Migraines - Sumatriptan 50 mg PRN can give second dose if not improving  - Nortriptyline   Diet: Carb modified  IVF: None VTE: Enoxaparin Code: Full  Dispo: Anticipated discharge to Home in 0-1 days.  Iona Beard, MD 01/26/2021, 10:17 AM Pager: [phone]  Please contact the on call pager after 5 pm and on weekends at [phone].

## 2021-01-26 NOTE — Progress Notes (Signed)
Pt accidentally pulled out one of her IV's. IV team was consulted, new IV inserted,

## 2021-01-26 NOTE — Hospital Course (Signed)
HPI  Ms Whitney Benton is a 50 year old female with a PMHx of T2DM, anxiety, depression, hyperlipidemia, and migraines who presents for nausea and vomiting that began 3 days ago. Unfortunately she was not taking her insulin during this time as she was concerned her blood sugars would drop since she was not eating. Brought the ED after going to urgent and found to have glucose >600. Admitted for DKA.   DKA  Uncontrolled insulin dependent diabetes mellitus Patient presenting with nausea, vomiting, and hyperglycemia in the setting of medication nonadherence. Found to have glucose greater 600 in ED, Na 131, K 7.2, anion gap 15, pH 7, and beta hydroxy buterate 7.76. Started on IV insulin, 2 L bolus, and LR 125 ml in the ED. CBG have been improving in the ED now in 370s.  -IV insulin infusion -LR 2 L bolus, LR 125 ml/hr switched to D5 LR once glucose <250 -NPO -Monitor CBG -BMP q4    Patient has not been taking any medication and not using insulin due to nausea and vomiting. On lantus 56 units nightly, and novolog 22 units morning and evening and 12 units a lunch time. A1c 17 on admission -IV insulin   Acute kidney injury Cr of 1.67 on presentation, baseline ~0.9 improved with IV fluids to 1.00 - Monitor BMP - I/os   Chest pain Patient presents with intermittent CP and palpitations with vomiting. On exam has reproducible tenderness of epigastrium and midsternal area. Troponin negative, EKG with sinus tachycardia sp calcium gluconate due to concerned for peaked T waves. Hyperkalemia likely due to DKA. CP and epigastric pain likely secondary to prolonged vomiting.   Migraines History of difficult to control migraine. On rizatriptan and nortriptyline 25 mg at bedtime at home.   Hyperlipidemia On atorvastatin 40 mg daily at home    Depression On duloxetine 60 mg daily at home

## 2021-01-26 NOTE — Discharge Summary (Signed)
Name: Whitney Benton MRN: [id number] DOB: 07/17/1971 50 y.o. PCP: Ladell Pier, MD  Date of Admission: 01/23/2021 10:39 AM Date of Discharge:  01/26/21  Attending Physician: Aldine Contes, MD   Discharge Diagnosis: 1. Diabetic ketoacidosis 2. Uncontrolled diabetes mellitus  3. AKI  Discharge Medications: Allergies as of 01/26/2021      Reactions   Aspirin Hives   Oxycodone Nausea And Vomiting      Medication List    TAKE these medications   atorvastatin 20 MG tablet Commonly known as: LIPITOR TAKE 1 TABLET (20 MG TOTAL) BY MOUTH DAILY.   cyclobenzaprine 10 MG tablet Commonly known as: FLEXERIL Take 1 tablet (10 mg total) by mouth at bedtime. Must have office visit for refills   DULoxetine 60 MG capsule Commonly known as: CYMBALTA TAKE 1 CAPSULE (60 MG TOTAL) BY MOUTH DAILY.   glucose blood test strip Check blood sugars three times a day   hydrOXYzine 50 MG tablet Commonly known as: ATARAX/VISTARIL TAKE 1 TABLET (50 MG TOTAL) BY MOUTH AT BEDTIME.   Lantus SoloStar 100 UNIT/ML Solostar Pen Generic drug: insulin glargine Inject 56 Units into the skin at bedtime. What changed: how much to take   melatonin 3 MG Tabs tablet Take 1 tablet (3 mg total) by mouth at bedtime.   Multivitamin Adult Tabs Take 1 tablet by mouth daily.   nortriptyline 25 MG capsule Commonly known as: PAMELOR TAKE 1 CAPSULE (25 MG TOTAL) BY MOUTH AT BEDTIME.   NovoLOG FlexPen 100 UNIT/ML FlexPen Generic drug: insulin aspart INJECT 22 UNITS BEFORE BREAKFAST, 12 UNITS BEFORE LUNCH, AND 22 UNITS BEFORE DINNER. What changed: See the new instructions.   ondansetron 4 MG disintegrating tablet Commonly known as: Zofran ODT Take 1 tablet (4 mg total) by mouth every 8 (eight) hours as needed for nausea or vomiting.   pantoprazole 20 MG tablet Commonly known as: PROTONIX Take 2 tablets (40 mg total) by mouth daily for 14 days.   Pen Needles 31G X 6 MM Misc Use as directed    rizatriptan 10 MG tablet Commonly known as: Maxalt Take 1 tablet (10 mg total) by mouth as needed for migraine (May repeat in 2 hours.  maximum 2 tablets in 24 hours.). May repeat in 2 hours if needed   topiramate 100 MG tablet Commonly known as: TOPAMAX Take 1 tablet (100 mg total) by mouth at bedtime.   True Metrix Meter w/Device Kit Check blood sugars three times a day   TRUEplus Lancets 28G Misc Check blood sugars three time a day   vitamin B-12 100 MCG tablet Commonly known as: CYANOCOBALAMIN Take 100 mcg by mouth daily.       Disposition and follow-up:   Ms.Whitney Benton was discharged from [facility] in Stable condition.  At the hospital follow up visit please address:  1.  DKA/Diabetes: Please assess glycemic control and modify medications as needed. Would benefit from diabetic education especially how to manage insulin if she were to develop nausea and vomiting again.  AKI: kidney function back to baseline, please assess kidney function and improvement in hypkalemia  2.  Labs / imaging needed at time of follow-up: CBG, BMP  3.  Pending labs/ test needing follow-up: none  Follow-up Appointments:  Please follow [facility] with [facility] in [facility] week   Hospital Course by problem list:  HPI Ms Whitney Benton is a 50 year old female with a PMHx of T2DM, anxiety, depression,  hyperlipidemia, and migraines who presents for nausea and vomiting that began 3 days ago. Unfortunately she was not taking her insulin during this time as she was concerned her blood sugars would drop since she was not eating. Brought the ED after going to urgent and found to have glucose >600. Admitted for DKA.   DKA  Uncontrolled insulin dependent diabetes mellitus Patient presenting with nausea, vomiting, and hyperglycemia in the setting of medication nonadherence. Found to have glucose greater 600 in ED, Na 131, K 7.2, anion gap 15, pH 7, and beta hydroxy  buterate 7.76, A1c 17. Started on IV insulin and IV fluids. Beta hydroxybutyrate initially improving at attempted to transition to sq insulin on 2/3 however beta hydroxybutyrate started increased. On day of discharge beta hydroxy butyrate normalized to 0.11 on day of discharge and anion gap closed. Transitioned to Lantus 50 units and Novolog 10 units with meals. She was able to tolerate diet. Will discharge her home on home Lantus 56 units and Novolog 22 units morning and night and 12 units at lunch time. A1c during admission 17 will need to follow up with PCP to adjust medications for better glycemic control.    Acute kidney injury Cr of 1.67 on presentation with baseline ~0.9 improved with IV fluids to 0.69 on day of discharge. K replete during and improving to 3.3.    Chest pain Sinus tachycardia Patient presents with intermittent CP and palpitations with vomiting. On exam has reproducible tenderness of epigastrium and midsternal area. Troponin negative, EKG with sinus tachycardia sp calcium gluconate due to concerned for peaked T waves. Hyperkalemia in setting of DKA. CP and epigastric pain likely secondary to prolonged vomiting and resolved by discharge. Patient notes that her HR has been elevated even prior to feeling ill in the 110s. Per chart review has had tachycardia since 2020. HR normalized by day of discharge, will need outpatient follow up if tachycardia recurs.    Discharge Exam:   BP (!) 145/95 (BP Location: Left Arm)   Pulse 92   Temp 99 F (37.2 C)   Resp 16   Ht 5' 3"  (1.6 m)   Wt 96.6 kg   SpO2 99%   BMI 37.73 kg/m  Discharge exam: Constitution: Sitting in chair, finished breakfast, in no acute distress Pulmonary/Chest: CTA, no wheezing, no increased work of breathing Cardiac: regular rateregular rhythmn, normal S1, S2 no murmurs Abdominal: Soft, non-tender, non-distended Neurological: Alert oriented x3  Pertinent Labs, Studies, and Procedures:  CBC Latest Ref Rng  & Units 01/25/2021 01/23/2021 01/23/2021  WBC 4.0 - 10.5 K/uL 9.9 - -  Hemoglobin 12.0 - 15.0 g/dL 12.5 18.4(H) 18.0(H)  Hematocrit 36.0 - 46.0 % 36.2 54.0(H) 53.0(H)  Platelets 150 - 400 K/uL 303 - -  BMP Component Ref Range & Units 14:04  (01/26/21) 07:27  (01/26/21) 03:42  (01/26/21) 1 d ago  (01/25/21) 1 d ago  (01/25/21) 1 d ago  (01/25/21) 1 d ago  (01/25/21)  Sodium 135 - 145 mmol/L 137  140  141  138  140  139  136   Potassium 3.5 - 5.1 mmol/L 3.3Low  3.1Low  2.7Low Panic CM  3.0Low  3.1Low  3.1Low  3.5   Chloride 98 - 111 mmol/L 103  108  107  107  107  107  106   CO2 22 - 32 mmol/L 23  24  25  22  23   21Low  18Low   Glucose, Bld 70 - 99 mg/dL 236High  133High  CM  115High CM  192High CM  158High CM  126High CM  289High CM   Comment: Glucose reference range applies only to samples taken after fasting for at least 8 hours.  BUN 6 - 20 mg/dL <5Low  <5Low  <5Low  6  8  10  11    Creatinine, Ser 0.44 - 1.00 mg/dL 0.69  0.63  0.63  0.69  0.71  0.81  0.92   Calcium 8.9 - 10.3 mg/dL 8.7Low  8.3Low  8.5Low  8.3Low  8.5Low  9.0  8.4Low   GFR, Estimated >60 mL/min >60  >60 CM  >60 CM  >60 CM  >60 CM  >60 CM  >60 CM     Component Ref Range & Units 03:42  (01/26/21) 1 d ago  (01/25/21) 1 d ago  (01/25/21) 1 d ago  (01/25/21) 1 d ago  (01/25/21) 2 d ago  (01/24/21) 2 d ago  (01/24/21)  Beta-Hydroxybutyric Acid 0.05 - 0.27 mmol/L 0.11  0.52High CM  0.61High CM  3.22High CM  2.41High CM  0.36High CM  3.54High CM    Component Ref Range & Units 12:05 07:30 06:33 05:20 04:11 02:04 01:05  Glucose-Capillary 70 - 99 mg/dL 189High  120High CM  116High CM  112High CM  110High CM  146High CM  147High      Discharge Instructions: Discharge Instructions    Call MD for:  persistant nausea and vomiting   Complete by: As directed    Diet Carb Modified   Complete by: As directed    Discharge instructions   Complete by: As directed    Ms Whitney Benton,  You were hospitalized for Diabeteic ketoacidosis due to your blood glucose going up due to not taking your insulin. If you develop nausea and vomiting causing you to be unable to eat again please call your primary care doctor. You may need your insulin reduced but may still need to use some insulin to prevent diabetic ketoacidosis.  Thank you for allowing [place] to be part of your care.  Please follow up with you PCP provider in the next week for follow up. Please also discuss elevated HR if this continues to persist.   Thank you!   Increase activity slowly   Complete by: As directed       Signed: Iona Beard, MD 01/26/2021, 3:46 PM   Pager: [phone]

## 2021-01-26 NOTE — Progress Notes (Signed)
Got a call from lab with critical potassium level 2.7 on call MD aware. Pt was started on potassium 10 Meq. IVPB. See mar.

## 2021-01-26 NOTE — Progress Notes (Signed)
PAtient remains on Insulin drip. BS check Q1-2hrs.

## 2021-01-26 NOTE — Progress Notes (Signed)
Pt remains NPO, continues on Endo tool for Insulin Drip.

## 2021-01-26 NOTE — Progress Notes (Signed)
Spoke with patient on the phone. States that she was diagnosed with diabetes in 2020. Has been on insulin since then. Takes Lantus 52 units at HS, and Novolog 22 units at breakfast, 12 units as lunch, and 22 units at dinner. She checks blood sugars three times per day. States that she has Lantus insulin at home, but may just have only one Novolog insulin pen left. Will need prescriptions for both so that she can get refills at [facility].  Discussed sick care management and when she should call the doctor if blood sugars get above 250 mg/dl. She needs to check blood sugars more frequently every 2-4 hours. Suggested that she ask her doctor how much insulin she should take when sick and unable to keep food or liquids down. Attached diabetes sick day rules to discharge papers.  States that she will be making an appointment with physician to follow up next week.  Harvel Ricks RN BSN CDE Diabetes Coordinator Pager: [phone]  8am-5pm

## 2021-01-29 ENCOUNTER — Telehealth: Payer: Self-pay

## 2021-01-29 ENCOUNTER — Telehealth: Payer: Self-pay | Admitting: *Deleted

## 2021-01-29 ENCOUNTER — Other Ambulatory Visit: Payer: Self-pay | Admitting: Internal Medicine

## 2021-01-29 ENCOUNTER — Other Ambulatory Visit: Payer: Self-pay | Admitting: Pharmacy Technician

## 2021-01-29 DIAGNOSIS — E1165 Type 2 diabetes mellitus with hyperglycemia: Secondary | ICD-10-CM

## 2021-01-29 DIAGNOSIS — Z794 Long term (current) use of insulin: Secondary | ICD-10-CM

## 2021-01-29 MED FILL — ?BASAGLAR 100 UNITS/ML KWPE: 100 | 26 days supply | Qty: 15 | Fill #4

## 2021-01-29 NOTE — Telephone Encounter (Signed)
Transition Care Management Unsuccessful Follow-up Telephone Call  Date of discharge and from where:  01/26/2021, [facility]  Attempts:  1st Attempt  Reason for unsuccessful TCM follow-up call:  Call placed to # [phone], patient's mother answered and said she did not have the patient's phone number but would have a family member get the message to patient to have her return the call to this CM   Patient needs to schedule follow up appointment with PCP

## 2021-01-29 NOTE — Telephone Encounter (Signed)
Transition Care Management Unsuccessful Follow-up Telephone Call  Date of discharge and from where:  01/26/2021 - [facility]  Attempts:  1st Attempt  Reason for unsuccessful TCM follow-up call:  Left voice message

## 2021-01-29 NOTE — Telephone Encounter (Signed)
Requested medication (s) are due for refill today: yes  Requested medication (s) are on the active medication list: yes  Last refill:  11/23/20  Future visit scheduled: yes on 01/30/21  Notes to clinic:  Please review for refill. Patient has appt scheduled on tomorrow.    Requested Prescriptions  Pending Prescriptions Disp Refills   NOVOLOG FLEXPEN 100 UNIT/ML FlexPen [Pharmacy Med Name: novoLOG FLEXPEN SYRINGE 100 Solution Pen-injector] 18 mL 0    Sig: INJECT 22 UNITS BEFORE BREAKFAST, 12 UNITS BEFORE LUNCH, AND 22 UNITS BEFORE DINNER.      Endocrinology:  Diabetes - Insulins Failed - 01/29/2021  2:44 PM      Failed - HBA1C is between 0 and 7.9 and within 180 days    HbA1c, POC (controlled diabetic range)  Date Value Ref Range Status  06/08/2020   Final    Comment:    >15.0          Failed - Valid encounter within last 6 months    Recent Outpatient Visits           7 months ago Type 2 diabetes mellitus with hyperglycemia, with long-term current use of insulin ([place])   [place], Neoma Laming B, MD   9 months ago Migraine without aura and with status migrainosus, not intractable   [place], Deborah B, MD   10 months ago Type 2 diabetes mellitus with hyperglycemia, with long-term current use of insulin [facility])   [place] Karle Plumber B, MD   10 months ago Type 2 diabetes mellitus with hyperglycemia, with long-term current use of insulin [facility])   [place], Annie Main L, RPH-CPP   10 months ago Type 2 diabetes mellitus with hyperglycemia, with long-term current use of insulin [facility])   [place], Jarome Matin, RPH-CPP       Future Appointments             Tomorrow Gildardo Pounds, NP [place]

## 2021-01-30 ENCOUNTER — Ambulatory Visit (INDEPENDENT_AMBULATORY_CARE_PROVIDER_SITE_OTHER): Payer: Self-pay | Admitting: General Practice

## 2021-01-30 ENCOUNTER — Other Ambulatory Visit (HOSPITAL_COMMUNITY)
Admission: RE | Admit: 2021-01-30 | Discharge: 2021-01-30 | Disposition: A | Discharge status: Home / Self Care | Payer: Medicaid Other | Source: Ambulatory Visit | Attending: Family Medicine | Admitting: Family Medicine

## 2021-01-30 ENCOUNTER — Telehealth: Payer: Self-pay

## 2021-01-30 ENCOUNTER — Other Ambulatory Visit: Payer: Self-pay | Admitting: Nurse Practitioner

## 2021-01-30 ENCOUNTER — Ambulatory Visit: Payer: Self-pay | Attending: Nurse Practitioner | Admitting: Nurse Practitioner

## 2021-01-30 ENCOUNTER — Other Ambulatory Visit: Payer: Self-pay

## 2021-01-30 ENCOUNTER — Encounter: Payer: Self-pay | Admitting: Nurse Practitioner

## 2021-01-30 VITALS — BP 116/78 | HR 98 | Ht 63.0 in | Wt 208.0 lb

## 2021-01-30 DIAGNOSIS — N898 Other specified noninflammatory disorders of vagina: Secondary | ICD-10-CM

## 2021-01-30 DIAGNOSIS — Z09 Encounter for follow-up examination after completed treatment for conditions other than malignant neoplasm: Secondary | ICD-10-CM

## 2021-01-30 DIAGNOSIS — E1165 Type 2 diabetes mellitus with hyperglycemia: Secondary | ICD-10-CM

## 2021-01-30 DIAGNOSIS — Z794 Long term (current) use of insulin: Secondary | ICD-10-CM

## 2021-01-30 DIAGNOSIS — M62838 Other muscle spasm: Secondary | ICD-10-CM

## 2021-01-30 MED ORDER — CYCLOBENZAPRINE HCL 10 MG PO TABS: 10.0000 mg | ORAL_TABLET | Freq: Every day | ORAL | 0 refills | Status: DC | PRN

## 2021-01-30 MED ORDER — TRULICITY 0.75 MG/0.5ML ~~LOC~~ SOAJ: 0.7500 mg | SUBCUTANEOUS | 3 refills | Status: DC

## 2021-01-30 MED FILL — $novoLOG FLEXPEN SYRINGE: 100 | 26 days supply | Qty: 15 | Fill #0

## 2021-01-30 MED FILL — ?TRULICITY 0.75 MG/0.5ML PE: 0.75 | 28 days supply | Qty: 2 | Fill #0

## 2021-01-30 MED FILL — CYCLOBENZAPRINE 10 MG TAB: 10 | 30 days supply | Qty: 30 | Fill #0

## 2021-01-30 NOTE — Progress Notes (Signed)
Patient presents to office today requesting self swab due to recent abnormal vaginal discharge and vaginal itching/discomfort off/on for the past couple of weeks. Patient instructed in self swab and specimen collected. Discussed results take 24-48 hours to finalize and she will be contacted with any abnormal results as well as any needed prescriptions sent to her pharmacy. Patient verbalized understanding. Patient had elevated phq9 & gad7 and would like appt with jamie- will schedule at checkout.  Koren Bound RN BSN 01/30/21

## 2021-01-30 NOTE — Progress Notes (Signed)
Virtual Visit via Telephone Note Due to national recommendations of social distancing due to [place] 19, telehealth visit is felt to be most appropriate for this patient at this time.  I discussed the limitations, risks, security and privacy concerns of performing an evaluation and management service by telephone and the availability of in person appointments. I also discussed with the patient that there may be a patient responsible charge related to this service. The patient expressed understanding and agreed to proceed.    I connected with Whitney Benton on 01/30/21  at   2:30 PM EST  EDT by telephone and verified that I am speaking with the correct person using two identifiers.   Consent I discussed the limitations, risks, security and privacy concerns of performing an evaluation and management service by telephone and the availability of in person appointments. I also discussed with the patient that there may be a patient responsible charge related to this service. The patient expressed understanding and agreed to proceed.   Location of Patient: Private  Residence   Location of Provider: [facility] [employer] Office    Persons participating in Telemedicine visit: [name] FNP-BC [place]    History of Present Illness: Telemedicine visit for: HFU She has a past medical history of Anxiety, Depression, Poorly controlled Diabetes mellitus, anxiety, depression, and dyslipidemia  HFU Admitted to the hospital on 01-23-2021 with DKA and AKI. SHe ha not been taking her insulin due to nausea and vomiting and was noted for Glucose >600 on arrival. Treated with IVF/IV insulin. She was discharged home on lantus 56 units and home regimen of Novolog. A1c during admission noted at 17. Cr+ 1.67 improved with IVF down to 0.69 on discharge day.    Currently:  Blood glucose levels average: 145-200s, She is taking 52 units instead of 56 units as prescribed by her PCP.  States she was unaware her dosage had been changed in June of last year. She is also administering novolog 22 units before breakfast 12 units before lunch and 22 units before dinner. I am adding trulicity today as H4L has not been at goal and consistently >10.  Lab Results  Component Value Date   HGBA1C  06/08/2020     Comment:     >15.0   Lab Results  Component Value Date   LDLCALC 92 02/04/2020   Past Medical History:  Diagnosis Date  . Anxiety   . Depression   . Diabetes mellitus without complication ([place])   . Trichomonas infection     Past Surgical History:  Procedure Laterality Date  . CESAREAN SECTION    . VAGINAL HYSTERECTOMY  2006   Fibroids, menorrhagia, benign pathology    Family History  Problem Relation Age of Onset  . Diabetes Mother   . Mental illness Mother   . Depression Mother   . Hypertension Mother   . Hypertension Father   . Diabetes Father     Social History   Socioeconomic History  . Marital status: Significant Other    Spouse name: Not on file  . Number of children: 3  . Years of education: 53  . Highest education level: Not on file  Occupational History  . Not on file  Tobacco Use  . Smoking status: Former Smoker    Packs/day: 0.25    Years: 8.00    Pack years: 2.00    Types: Cigarettes  . Smokeless tobacco: Never Used  Vaping Use  . Vaping Use:  Never used  Substance and Sexual Activity  . Alcohol use: Yes    Comment: occasional  . Drug use: No  . Sexual activity: Yes    Birth control/protection: Surgical  Other Topics Concern  . Not on file  Social History Narrative   Patient lives at home with mother and father , one story    Patient has 3 children    Patient is single   Patient has 14 years of education    Patient is right handed    Social Determinants of Health   Financial Resource Strain: Not on file  Food Insecurity: Food Insecurity Present  . Worried About Charity fundraiser in the Last Year: Sometimes true  . Ran  Out of Food in the Last Year: Often true  Transportation Needs: No Transportation Needs  . Lack of Transportation (Medical): No  . Lack of Transportation (Non-Medical): No  Physical Activity: Not on file  Stress: Not on file  Social Connections: Not on file     Observations/Objective: Awake, alert and oriented x 3   Review of Systems  Constitutional: Negative for fever, malaise/fatigue and weight loss.  HENT: Negative.  Negative for nosebleeds.   Eyes: Negative.  Negative for blurred vision, double vision and photophobia.  Respiratory: Negative.  Negative for cough and shortness of breath.   Cardiovascular: Negative.  Negative for chest pain, palpitations and leg swelling.  Gastrointestinal: Negative.  Negative for heartburn, nausea and vomiting.  Musculoskeletal: Positive for myalgias.  Neurological: Negative.  Negative for dizziness, focal weakness, seizures and headaches.  Psychiatric/Behavioral: Negative.  Negative for suicidal ideas.    Assessment and Plan: Whitney Benton was seen today for hospitalization follow-up.  Diagnoses and all orders for this visit:  Hospital discharge follow-up  Type 2 diabetes mellitus with hyperglycemia, with long-term current use of insulin (HCC) -     Dulaglutide (TRULICITY) 7.20 NO/7.0JG SOPN; Inject 0.75 mg into the skin once a week. Continue blood sugar control as discussed in office today, low carbohydrate diet, and regular physical exercise as tolerated, 150 minutes per week (30 min each day, 5 days per week, or 50 min 3 days per week). Keep blood sugar logs with fasting goal of 90-130 mg/dl, post prandial (after you eat) less than 180.  For Hypoglycemia: BS <60 and Hyperglycemia BS >400; contact the clinic ASAP. Annual eye exams and foot exams are recommended.   Muscle spasm -     cyclobenzaprine (FLEXERIL) 10 MG tablet; Take 1 tablet (10 mg total) by mouth daily as needed for muscle spasms. Must have office visit for refills     Follow Up  Instructions Return in about 4 weeks (around 02/27/2021) for meter check with luke..     I discussed the assessment and treatment plan with the patient. The patient was provided an opportunity to ask questions and all were answered. The patient agreed with the plan and demonstrated an understanding of the instructions.   The patient was advised to call back or seek an in-person evaluation if the symptoms worsen or if the condition fails to improve as anticipated.  I provided 15 minutes of non-face-to-face time during this encounter including median intraservice time, reviewing previous notes, labs, imaging, medications and explaining diagnosis and management.  Gildardo Pounds, FNP-BC

## 2021-01-30 NOTE — Telephone Encounter (Signed)
Transition Care Management Unsuccessful Follow-up Telephone Call  Date of discharge and from where: 01/26/2021, [facility]   Attempts:  2nd Attempt  Reason for unsuccessful TCM follow-up call:  Left message with patient's mother requesting she contact this CM.  Patient has virtual appointment scheduled for today with Ms Raul Del, NP

## 2021-01-30 NOTE — Progress Notes (Signed)
Chart reviewed for nurse visit. Agree with plan of care.   Clarnce Flock, MD 01/30/21 1:03 PM

## 2021-01-30 NOTE — Telephone Encounter (Signed)
Transition Care Management Unsuccessful Follow-up Telephone Call  Date of discharge and from where:  01/26/2021 - [facility]  Attempts:  2nd Attempt  Reason for unsuccessful TCM follow-up call:  Left voice message with pt's mother

## 2021-01-31 LAB — CERVICOVAGINAL ANCILLARY ONLY
Bacterial Vaginitis (gardnerella): POSITIVE — AB
Candida Glabrata: NEGATIVE
Candida Vaginitis: POSITIVE — AB
Chlamydia: NEGATIVE
Comment: NEGATIVE
Comment: NEGATIVE
Comment: NEGATIVE
Comment: NEGATIVE
Comment: NEGATIVE
Comment: NORMAL
Neisseria Gonorrhea: NEGATIVE
Trichomonas: NEGATIVE

## 2021-01-31 NOTE — Telephone Encounter (Signed)
Transition Care Management Unsuccessful Follow-up Telephone Call  Date of discharge and from where:  01/26/2021 [facility]  Attempts:  3rd Attempt  Reason for unsuccessful TCM follow-up call:  Left voice message

## 2021-02-01 ENCOUNTER — Other Ambulatory Visit: Payer: Self-pay | Admitting: Family Medicine

## 2021-02-01 MED ORDER — METRONIDAZOLE 500 MG PO TABS: 500.0000 mg | ORAL_TABLET | Freq: Two times a day (BID) | ORAL | 0 refills | Status: DC

## 2021-02-01 MED ORDER — FLUCONAZOLE 150 MG PO TABS: 150.0000 mg | ORAL_TABLET | Freq: Once | ORAL | 0 refills | Status: DC

## 2021-02-01 MED FILL — FLUCONAZOLE 150 MG TABLET: 150 | 1 days supply | Qty: 1 | Fill #0

## 2021-02-01 MED FILL — metroNIDAZOLE 500 MG TABS: 500 | 7 days supply | Qty: 14 | Fill #0

## 2021-02-01 NOTE — Addendum Note (Signed)
Addended by: Clayton Lefort on: 02/01/2021 03:45 PM   Modules accepted: Orders

## 2021-02-01 NOTE — Progress Notes (Signed)
Swab positive for BV and yeast, rx sent

## 2021-02-05 ENCOUNTER — Other Ambulatory Visit: Payer: Self-pay

## 2021-02-05 ENCOUNTER — Telehealth: Payer: Self-pay | Admitting: Internal Medicine

## 2021-02-05 ENCOUNTER — Ambulatory Visit (HOSPITAL_COMMUNITY)
Admission: EM | Admit: 2021-02-05 | Discharge: 2021-02-05 | Disposition: A | Discharge status: Home / Self Care | Payer: No Payment, Other | Attending: Psychiatry | Admitting: Psychiatry

## 2021-02-05 DIAGNOSIS — F4329 Adjustment disorder with other symptoms: Secondary | ICD-10-CM | POA: Diagnosis not present

## 2021-02-05 DIAGNOSIS — F4381 Prolonged grief disorder: Secondary | ICD-10-CM

## 2021-02-05 MED ORDER — TRAZODONE HCL 50 MG PO TABS: 50.0000 mg | ORAL_TABLET | Freq: Every evening | ORAL | 0 refills | Status: DC | PRN

## 2021-02-05 MED FILL — DULoxetine HCL 60 MG CPEP: 60 | 30 days supply | Qty: 30 | Fill #3

## 2021-02-05 MED FILL — ?ATORVASTATIN 20 MG TABLET: 20 | 30 days supply | Qty: 30 | Fill #3

## 2021-02-05 MED FILL — ?TRAZODONE HCL 50 TABS: 50 | 15 days supply | Qty: 30 | Fill #0

## 2021-02-05 NOTE — Telephone Encounter (Signed)
Call placed to patient. She is concerned that her sugars are not going down despite recent addition of Trulicity. I explained that it takes some time to see improved glycemic control with the Trulicity as she has only taken 1 injection since seeing Zelda on 01/30/2021. She denies symptoms of hyperglycemia. Denies feeling more fatigued that her baseline. I advised her to go to the ED if her sugars increase >400 consistently and/or she feels symptomatic. Patient verbalized understanding. She has a follow-up with me scheduled next month.

## 2021-02-05 NOTE — Discharge Instructions (Signed)

## 2021-02-05 NOTE — Telephone Encounter (Signed)
Copied from [place] [phone] [phone]. Topic: General - Other >> Feb 05, 2021 11:44 AM Leward Quan A wrote: Reason for CRM: Patient called in to inform Dr Wynetta Emery that she had a blood sugar reading of  445 on 02/04/21 after taking her  Dulaglutide (TRULICITY) 4.49 PN/3.0YF SOPN and that this morning havent eaten or drank anything during the night and her blood sugar is  385 02/05/21.  Please advise patient is very concerned  Ph# [phone] or [phone]

## 2021-02-05 NOTE — ED Provider Notes (Signed)
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 female.  Patient presents voluntarily as a walk-in to the [facility] accompanied by her mother.  Patient presents because she has been dealing with some depressive symptoms and has had thoughts of wanting to harm one of her sister's ex's.  She reports that her father passed away in 04/04/2019, however even though he that he had multiple medical concerns she feels that her sister's ex-boyfriend because so much stress on the family that it causes her father to die sooner than he should have.  She reports that she is not going to going home to his personality she does not know where that he is located at.  She does report though that she does have depression, decreased appetite, disrupted sleep.  She reports that she is started on Cymbalta and continues to take it but reports that she feels that she could use help with her sleep.  She states that maybe the Cymbalta needs to be increased but she does not have an outpatient psychiatrist.  Patient denies having any suicidal ideations and denies any auditory visual hallucinations and denies any homicidal ideations.  Other than the patient receiving Cymbalta and hydroxyzine from her PCP she has no psych history and no psychiatric treatment.  After discussing with patient and encouraged her to continue her Cymbalta 60 mg p.o. daily and I will start her on trazodone 50 mg 1-2 tabs at bedtime as needed for sleep.  She reports that she would like to follow-up with outpatient services and she is provided with information for open access at the [facility].  Patient's mother is with her and has no safety concerns with the patient discharging home today.  Psychiatric Specialty Exam  Presentation   General Appearance:Appropriate for Environment; Casual  Eye Contact:Good  Speech:Clear and Coherent; Normal Rate  Speech Volume:Normal  Handedness:Right   Mood and Affect  Mood:Depressed  Affect:Appropriate; Congruent   Thought Process  Thought Processes:Coherent  Descriptions of Associations:Intact  Orientation:Full (Time, Place and Person)  Thought Content:WDL  Hallucinations:None  Ideas of Reference:None  Suicidal Thoughts:No  Homicidal Thoughts:No   Sensorium  Memory:Immediate Good; Recent Good; Remote Good  Judgment:Fair  Insight:Good   Executive Functions  Concentration:Good  Attention Span:Good  [place]  Language:Good   Psychomotor Activity  Psychomotor Activity:Normal   Assets  Assets:Communication Skills; Desire for Improvement; Housing; Social Support; Transportation   Sleep  Sleep:Fair  Number of hours: No data recorded  Physical Exam: Physical Exam Vitals and nursing note reviewed.  Constitutional:      Appearance: She is well-developed.  HENT:     Head: Normocephalic.  Eyes:     Pupils: Pupils are equal, round, and reactive to light.  Cardiovascular:     Rate and Rhythm: Tachycardia present.  Pulmonary:     Effort: Pulmonary effort is normal.  Musculoskeletal:        General: Normal range of motion.  Neurological:     Mental Status: She is alert and oriented to person, place, and time.    Review of Systems  Constitutional: Negative.   HENT: Negative.   Eyes: Negative.   Respiratory: Negative.   Cardiovascular: Negative.   Gastrointestinal: Negative.   Genitourinary: Negative.   Musculoskeletal:  Negative.   Skin: Negative.   Neurological: Negative.   Endo/Heme/Allergies: Negative.   Psychiatric/Behavioral: Positive for depression.   Blood pressure (!) 125/96, pulse (!) 111, temperature 98.9 F (37.2 C), temperature source Oral, resp. rate 20, SpO2 99 %. There is no height or  weight on file to calculate BMI.  Musculoskeletal: Strength & Muscle Tone: within normal limits Gait & Station: normal Patient leans: N/A   [place] [facility] Discharge Disposition for Follow up and Recommendations: Based on my evaluation the patient does not appear to have an emergency medical condition and can be discharged with resources and follow up care in outpatient services for Medication Management and [place], FNP 02/05/2021, 2:06 PM

## 2021-02-05 NOTE — Telephone Encounter (Signed)
Lurena Joiner would you be able to contact pt

## 2021-02-05 NOTE — BH Assessment (Signed)
Comprehensive Clinical Assessment (CCA) Note  02/05/2021 Whitney Benton [id number]   Patient is a 50 year old female with a history of anxiety and depression who presents voluntarily to [facility] for assessment.  Patient reports she has had worsening depressive symptoms, mostly associated with grieving the loss of her father in 2020.  Patient reports symptoms of anergia, low motivation, poor appetite and insomnia for months.  She also shared that she has had fleeting HI towards her sister's ex-partner.  She states this individual has caused so many problems for the family.  Patient's sister has ended the relationship with this man, however he continues to pursue her.  Patient has associated her father's decline and "sudden death" with this man, stating the stress he caused "took my father out sooner" than expected.  Patient does admit to wanting to hurt this man, however she denies intent to kill him.  Upon further probing, patient denies current HI and displays some insight into the repercussions of assaulting or killing this individual.  She is mostly focused on wanting treatment for her depression and continued problems dealing with the loss of her father.  Patient is able to affirm her safety.    Patient asked that her mother stay for he assessment.  Patient's mother states she has no safety concerns, as she does not believe patient has any intent to kill this man.  She agrees that the family has been through a lot, as they have tried to support patient's sister with this situation.  Patient is requesting medication for sleep and she would like referrals for outpatient providers.      Disposition: Per Marvia Pickles, NP patient does not meet criteria for inpatient treatment.  Outpatient treatment is recommended.  Patient has been provided with a bridge Rx until she is able to follow up with [facility].  Open access hours have been included in the AVS to be provided to pt upon d/c.   Chief  Complaint:  Chief Complaint  Patient presents with  . Depression    Passive HI, active at times   Visit Diagnosis: Complex Grief Disorder                             Anxiety Disorder Unspecified                             Depressive Disorder Unspecified   CCA Screening, Triage and Referral (STR)  Patient Reported Information How did you hear about [place]? Primary Care (Phreesia 02/05/2021)  Referral name: Akaisha Truman [facility] 02/05/2021)  Referral phone number: No data recorded  Whom do you see for routine medical problems? Primary Care (Phreesia 02/05/2021)  Practice/Facility Name: [facility] ([place] 02/05/2021)  Practice/Facility Phone Number: No data recorded Name of Contact: Dr Wynetta Emery ([place] 02/05/2021)  Contact Number: [phone] ([place] 02/05/2021)  Contact Fax Number: No data recorded Prescriber Name: Meleana Commerford ([place] 02/05/2021)  Prescriber Address (if known): No data recorded  What Is the Reason for Your Visit/Call Today? I Am depressant Aniety Worrried (Phreesia 02/05/2021)  How Long Has This Been Causing You Problems? > than 6 months (Phreesia 02/05/2021)  What Do You Feel Would Help You the Most Today? Medication (Phreesia 02/05/2021)   Have You Recently Been in Any Inpatient Treatment (Hospital/Detox/Crisis Center/28-Day Program)? No (Phreesia 02/05/2021)  Name/Location of Program/Hospital:No data recorded How Long Were You There? No data recorded  When Were You Discharged? No data recorded  Have You Ever Received Services From [facility] Before? Yes (Phreesia 02/05/2021)  Who Do You See at [facility]? Diabertic Kidney Failure (Phreesia 02/05/2021)   Have You Recently Had Any Thoughts About Hurting Yourself? No (Phreesia 02/05/2021)  Are You Planning to Commit Suicide/Harm Yourself At This time? No (Phreesia 02/05/2021)   Have you Recently Had Thoughts About [place]? Yes (Phreesia 02/05/2021)  Explanation: No  data recorded  Have You Used Any Alcohol or Drugs in the Past 24 Hours? No (Phreesia 02/05/2021)  How Long Ago Did You Use Drugs or Alcohol? No data recorded What Did You Use and How Much? No data recorded  Do You Currently Have a Therapist/Psychiatrist? No (Phreesia 02/05/2021)  Name of Therapist/Psychiatrist: No data recorded  Have You Been Recently Discharged From Any Office Practice or Programs? No (Phreesia 02/05/2021)  Explanation of Discharge From Practice/Program: No data recorded    CCA Screening Triage Referral Assessment Type of Contact: Face-to-Face  Is this Initial or Reassessment? No data recorded Date Telepsych consult ordered in [facility]:  No data recorded Time Telepsych consult ordered in [facility]:  No data recorded  Patient Reported Information Reviewed? Yes  Patient Left Without Being Seen? No data recorded Reason for Not Completing Assessment: No data recorded  Collateral Involvement: Patient's mother, who is present, provided collateral.   Does Patient Have a Julian? No data recorded Name and Contact of Legal Guardian: No data recorded If Minor and Not Living with Parent(s), Who has Custody? No data recorded Is CPS involved or ever been involved? Never  Is APS involved or ever been involved? Never   Patient Determined To Be At Risk for Harm To Self or Others Based on Review of Patient Reported Information or Presenting Complaint? No  Method: No data recorded Availability of Means: No data recorded Intent: No data recorded Notification Required: No data recorded Additional Information for Danger to Others Potential: No data recorded Additional Comments for Danger to Others Potential: No data recorded Are There Guns or Other Weapons in Your Home? No data recorded Types of Guns/Weapons: No data recorded Are These Weapons Safely Secured?                            No data recorded Who Could Verify You Are Able To Have These Secured: No  data recorded Do You Have any Outstanding Charges, Pending Court Dates, Parole/Probation? No data recorded Contacted To Inform of Risk of Harm To Self or Others: Family/Significant Other:   [facility] of Assessment: [facility]   Does Patient Present under Involuntary Commitment? No  IVC Papers Initial File Date: No data recorded  [place] of Residence: [place]   Patient Currently Receiving the Following Services: Not Receiving Services   Determination of Need: Routine (7 days)   Options For Referral: Outpatient Therapy; Medication Management     CCA Biopsychosocial Intake/Chief Complaint:  Patient presents reporting worsening depression associated with grief and a family matter involving her sister's ex-partner.  Patient admits to passive HI, that is sometimes active, towards sister's ex.  Current Symptoms/Problems: Patient reports worsening depression, insomnia, mostly associated with losing her father in 2020.   Patient Reported Schizophrenia/Schizoaffective Diagnosis in Past: No   Strengths: Motivated towards treatment  Preferences: No data recorded Abilities: No data recorded  Type of Services Patient Feels are Needed: Outpatient therapy and medication management.   Initial Clinical  Notes/Concerns: No data recorded  Mental Health Symptoms Depression:  Change in energy/activity   Duration of Depressive symptoms: Greater than two weeks   Mania:  None   Anxiety:   Worrying; Tension; Restlessness   Psychosis:  None   Duration of Psychotic symptoms: No data recorded  Trauma:  None   Obsessions:  Cause anxiety; Intrusive/time consuming   Compulsions:  None   Inattention:  None   Hyperactivity/Impulsivity:  N/A   Oppositional/Defiant Behaviors:  N/A   Emotional Irregularity:  N/A   Other Mood/Personality Symptoms:  No data recorded   Mental Status Exam Appearance and self-care  Stature:  Average   Weight:  Average weight    Clothing:  Neat/clean   Grooming:  Normal   Cosmetic use:  Age appropriate   Posture/gait:  Normal   Motor activity:  Not Remarkable   Sensorium  Attention:  Normal   Concentration:  Normal   Orientation:  Object; Person; Place; Situation; Time; X5   Recall/memory:  Normal   Affect and Mood  Affect:  Depressed   Mood:  Depressed   Relating  Eye contact:  Normal   Facial expression:  Depressed   Attitude toward examiner:  Cooperative   Thought and Language  Speech flow: Clear and Coherent   Thought content:  Appropriate to Mood and Circumstances   Preoccupation:  Homicidal   Hallucinations:  None   Organization:  No data recorded  [employer] of Knowledge:  Average   Intelligence:  Average   Abstraction:  Normal   Judgement:  Fair   Reality Testing:  Adequate   Insight:  Fair   Decision Making:  Normal; Vacilates   Social Functioning  Social Maturity:  Impulsive   Social Judgement:  Normal   Stress  Stressors:  Family conflict; Grief/losses   Coping Ability:  Overwhelmed   Skill Deficits:  Self-control; Responsibility   Supports:  Family; Friends/Service system     Religion: Religion/Spirituality Are You A Religious Person?: Yes How Might This Affect Treatment?: N/A  Leisure/Recreation: Leisure / Recreation Do You Have Hobbies?: Yes Leisure and Hobbies: going to yard sales  Exercise/Diet: Exercise/Diet Do You Exercise?: No Have You Gained or Lost A Significant Amount of Weight in the Past Six Months?: No Do You Follow a Special Diet?: No Do You Have Any Trouble Sleeping?: Yes Explanation of Sleeping Difficulties: restless, sleeps for 4 or more hours in the mornings   CCA Employment/Education Employment/Work Situation: Employment / Work Situation Employment situation: Unemployed What is the longest time patient has a held a job?: N/A Where was the patient employed at that time?: N/A Has patient ever been in  the [employer]?: No  Education: Education Is Patient Currently Attending School?: No Last Grade Completed: 12 Name of High School: [employer] Did Teacher, adult education From [employer]?: Yes Did You Have An Individualized Education Program (IIEP): No Did You Have Any Difficulty At [employer]?: No Patient's Education Has Been Impacted by Current Illness: No   CCA Family/Childhood History Family and Relationship History: Family history Marital status: Single What is your sexual orientation?: UTA Has your sexual activity been affected by drugs, alcohol, medication, or emotional stress?: UTA Does patient have children?: Yes How many children?:  (UTA) How is patient's relationship with their children?: UTA  Childhood History:  Childhood History By whom was/is the patient raised?: Both parents Additional childhood history information: Focus today was on patient's relationships with mother and father.  She is close with mother, who is  here, but describes self as "daddy's girl."  He passed away in 04/12/2019 and pt is really struggling with the grieving process. Description of patient's relationship with caregiver when they were a child: Good relationships with parents as a child. Patient's description of current relationship with people who raised him/her: Close with mother, who presented with patient today. How were you disciplined when you got in trouble as a child/adolescent?: UTA Does patient have siblings?: Yes Number of Siblings: 1 Description of patient's current relationship with siblings: Close with her sister. Did patient suffer any verbal/emotional/physical/sexual abuse as a child?: No Did patient suffer from severe childhood neglect?: No Has patient ever been sexually abused/assaulted/raped as an adolescent or adult?: No Was the patient ever a victim of a crime or a disaster?: No Witnessed domestic violence?: No Has patient been affected by domestic violence as an adult?: No  Child/Adolescent  Assessment:     CCA Substance Use Alcohol/Drug Use: Alcohol / Drug Use Pain Medications: See MAR Prescriptions: See MAR Over the Counter: See MAR History of alcohol / drug use?: No history of alcohol / drug abuse     ASAM's:  Six Dimensions of Multidimensional Assessment  Dimension 1:  Acute Intoxication and/or Withdrawal Potential:      Dimension 2:  Biomedical Conditions and Complications:      Dimension 3:  Emotional, Behavioral, or Cognitive Conditions and Complications:     Dimension 4:  Readiness to Change:     Dimension 5:  Relapse, Continued use, or Continued Problem Potential:     Dimension 6:  Recovery/Living Environment:     ASAM Severity Score:    ASAM Recommended Level of Treatment:     Substance use Disorder (SUD)    Recommendations for Services/Supports/Treatments:    DSM5 Diagnoses: Patient Active Problem List   Diagnosis Date Noted  . DKA (diabetic ketoacidosis) ([place]) 01/23/2021  . Glaucoma suspect 04/12/2020  . DKA (diabetic ketoacidoses) 10/02/2019  . AKI (acute kidney injury) ([place]) 10/02/2019  . Hyperkalemia 10/02/2019  . Leukocytosis 10/02/2019  . Abnormal LFTs 10/02/2019  . Stressful life events affecting family and household 08/06/2019  . New onset type 2 diabetes mellitus ([place]) 02/04/2019  . Obesity (BMI 30-39.9) 02/04/2019  . Tobacco dependence 02/04/2019  . Left arm weakness 12/06/2014  . Paresthesias/numbness 12/06/2014  . Tobacco abuse 11/14/2014  . Depression   . Mixed incontinence 06/27/2014  . History of TVH in 04-11-05 for fibroids and menorrhagia; benign pathology 09/08/2011    Patient Centered Plan: Patient is on the following Treatment Plan(s):  Depression   Referrals to Alternative Service(s): Patient has been referred to [facility] for outpatient treatment.   Fransico Meadow, [facility]

## 2021-02-05 NOTE — ED Notes (Signed)
LOCKER 28

## 2021-02-05 NOTE — Discharge Summary (Signed)
[name] to be D/C'd home per MD order. Discussed with the patient and all questions fully answered. An After Visit Summary was printed and given to the patient. Belongings were given to pt at discharge. Patient escorted out and D/C home via private auto.  Clois Dupes  02/05/2021 2:20 PM

## 2021-02-07 ENCOUNTER — Other Ambulatory Visit: Payer: Self-pay

## 2021-02-07 ENCOUNTER — Ambulatory Visit (INDEPENDENT_AMBULATORY_CARE_PROVIDER_SITE_OTHER): Payer: No Payment, Other | Admitting: Clinical

## 2021-02-07 DIAGNOSIS — F332 Major depressive disorder, recurrent severe without psychotic features: Secondary | ICD-10-CM | POA: Diagnosis not present

## 2021-02-07 NOTE — Progress Notes (Signed)
   THERAPIST PROGRESS NOTE  Session Time: 25 minutes  Participation Level: Active  Behavioral Response: CasualAlertDepressed  Type of Therapy: Individual Therapy  Treatment Goals addressed: Diagnosis: Depression  Interventions: CBT  Summary:  Whitney Benton is a 50 y.o. female who presents for the scheduled session oriented times five, appropriately dressed, and friendly. Client denied hallucinations and delusions. Client presents for a follow up to initiate outpatient therapy services at [facility] as a walk in post assessment and observation at [facility] urgent care on 02/05/2021. Client presented with her mother for the appointment.  The clients mother reported the client has been stressed lately because she has had difficulty with paying her rent and her landlord is threatening eviction. Client reported she has applied for assistance with social services, and they granted the request to pay her rent, but her landlord has put a timed date by when he wants the check in the mail. Client reported she has the adult children but her 23 year old son is living with her. Client and her mother reported he has not been good company but has added stress to her and wants him to move out. Client smother reported the son talks and treats her disrespectfully. Client reported she is also helps to look after her mother. Client reported her depressive symptoms began in 1999 after her husband passed away which she briefly did medication management for. Client reported the symptoms lingered and worsened when her father passed in 2020 whom she was very close to. Client reported "I don't want to do nothing but stay at home and sleep". Mother reported the client endorses "loss of interest, lack of motivation, sleeping more than usual, decreased appetite" which also negatively impact her because she is diabetic and kidney problems. Client reported she wants to engage in therapy to work on her grieving process.  [facility] ED  from 02/05/2021 in [facility]  PHQ-9 Total Score 24      Suicidal/Homicidal: Nowithout intent/plan  Therapist Response: Therapist made introduction and discussed confidentiality. Therapist engaged with the client and mother to discuss the assessment taken at urgent care and ongoing stressors affecting the current severity of her symptoms. Therapist performed nutritional assessment and pain assessment on the client. Therapist discussed process for ongoing therapy and psychiatric eval appointments. Therapist addressed questions and concerns.    Plan: Return for individual therapy and initial psychiatric evaluation appointment.   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

## 2021-02-07 NOTE — Progress Notes (Signed)
NEUROLOGY FOLLOW UP OFFICE NOTE  VENISA FRAMPTON [id number]   Subjective:  Meridian Klinke is a 50 year old right-handed female with diabetes who follows up for migraines and bilateral occipital neuralgia.  She is accompanied by her mother.  Hospital records reviewed.  UPDATE: Started low-dose nortriptyline in September.  They have been near daily.  She was seen in the ED on 1/1 for intractable migraine.   Intensity:  Moderate to severe Duration:  All day Frequency:  daily   She was hospitalized on 01/23/2021 for DKA.  Rescue therapy:  Rizatriptan 36m with Tylenol 15057mFrequency of abortive medication: daily Current NSAIDS:  none Current analgesics:  Tylenol Current triptans:  rizatriptan 1028murrent ergotamine:  none Current anti-emetic:  Zofran ODT 4mg11mrrent muscle relaxants:  Flexeril 10mg73m Current anti-anxiolytic:  hydroxyzine Current sleep aide:  melatonin Current Antihypertensive medications:  none Current Antidepressant medications:  Nortriptyline 25mg 63medtime; Cymbalta 60mg d32m Current Anticonvulsant medications:  none Current anti-CGRP:  none Current Vitamins/Herbal/Supplements:  Melatonin, B12 Current Antihistamines/Decongestants:  none Other therapy:  none Hormone/birth control:  none  Caffeine:  Cut down on coffee.  Now only decaff.  No soda Diet:  Drinks water with electrolytes.  Does not skip meals Exercise:  Walks 30 minutes daily Depression:  yes; Anxiety:  yes Other pain:  no Sleep hygiene:  poor  HISTORY: Migraines since 2000 but returned around 2019.  Initial workup in early 2000s included lumbar puncture but unsure of the results.  She doesn't remember if she had an MRI of the brain.  They are severe sharp and pounding pain in back of head bilaterally and radiates to the front.  She has associated flashes in her vision, photophobia, phonophobia, osmophobia, feels off-balance and sometimes nausea but no  Autonomic symptoms, numbness or  weakness.  They usually last 2-3 weeks.  They occur about twice a year, usually in Spring and Summer but can occur other times.  Triggers unknown.  Nothing really relieves them.  CT brain on 06/14/2020 personally reviewed was normal. Eye exam in 2021 okay.  Past NSAIDS:  Meloxicam, ketorolac, ibuprofen, naproxen Past analgesics:  Tylenol, Excedrin Past abortive triptans:  sumatriptan 50mg Pa19mbortive ergotamine:  none Past muscle relaxants:  Robaxin Past anti-emetic:  promethazine Past antihypertensive medications:  none Past antidepressant medications:  Sertraline 50mg Pas20mticonvulsant medications:  topiramate 100mg Past62mi-CGRP:  none Past vitamins/Herbal/Supplements:  none Past antihistamines/decongestants:  Benadryl, Zyrtec Other past therapies:  none   Family history of headache:  no  PAST MEDICAL HISTORY: Past Medical History:  Diagnosis Date  . Anxiety   . Depression   . Diabetes mellitus without complication (HCC)   . [place] infection     MEDICATIONS: Current Outpatient Medications on File Prior to Visit  Medication Sig Dispense Refill  . atorvastatin (LIPITOR) 20 MG tablet TAKE 1 TABLET (20 MG TOTAL) BY MOUTH DAILY. 30 tablet 3  . Blood Glucose Monitoring Suppl (TRUE METRIX METER) w/Device KIT Check blood sugars three times a day 1 kit 0  . cyclobenzaprine (FLEXERIL) 10 MG tablet Take 1 tablet (10 mg total) by mouth daily as needed for muscle spasms. Must have office visit for refills 30 tablet 0  . Dulaglutide (TRULICITY) 0.75 MG/0.8.01SKP/5.3ZSct 0.75 mg into the skin once a week. 2 mL 3  . DULoxetine (CYMBALTA) 60 MG capsule TAKE 1 CAPSULE (60 MG TOTAL) BY MOUTH DAILY. 30 capsule 3  . glucose blood test strip Check blood sugars three  times a day 100 each 12  . hydrOXYzine (ATARAX/VISTARIL) 50 MG tablet TAKE 1 TABLET (50 MG TOTAL) BY MOUTH AT BEDTIME. 30 tablet 0  . insulin glargine (LANTUS SOLOSTAR) 100 UNIT/ML Solostar Pen Inject 56 Units into the  skin at bedtime. (Patient taking differently: Inject 52 Units into the skin at bedtime.) 15 mL 4  . Insulin Pen Needle (PEN NEEDLES) 31G X 6 MM MISC Use as directed 100 each 0  . melatonin 3 MG TABS tablet Take 1 tablet (3 mg total) by mouth at bedtime. 30 tablet 2  . metroNIDAZOLE (FLAGYL) 500 MG tablet Take 1 tablet (500 mg total) by mouth 2 (two) times daily for 7 days. 14 tablet 0  . Multiple Vitamin (MULTIVITAMIN ADULT) TABS Take 1 tablet by mouth daily.    . nortriptyline (PAMELOR) 25 MG capsule TAKE 1 CAPSULE (25 MG TOTAL) BY MOUTH AT BEDTIME. 30 capsule 0  . NOVOLOG FLEXPEN 100 UNIT/ML FlexPen INJECT 22 UNITS BEFORE BREAKFAST, 12 UNITS BEFORE LUNCH, AND 22 UNITS BEFORE DINNER. 15 mL 0  . ondansetron (ZOFRAN ODT) 4 MG disintegrating tablet Take 1 tablet (4 mg total) by mouth every 8 (eight) hours as needed for nausea or vomiting. 20 tablet 0  . pantoprazole (PROTONIX) 20 MG tablet Take 2 tablets (40 mg total) by mouth daily for 14 days. 28 tablet 0  . rizatriptan (MAXALT) 10 MG tablet Take 1 tablet (10 mg total) by mouth as needed for migraine (May repeat in 2 hours.  maximum 2 tablets in 24 hours.). May repeat in 2 hours if needed 10 tablet 5  . topiramate (TOPAMAX) 100 MG tablet Take 1 tablet (100 mg total) by mouth at bedtime. (Patient not taking: Reported on 01/30/2021) 30 tablet 3  . traZODone (DESYREL) 50 MG tablet Take 1 tablet (50 mg total) by mouth at bedtime as needed for sleep. May take 1-2 tablets at bedtime as needed for sleep 30 tablet 0  . TRUEPLUS LANCETS 28G MISC Check blood sugars three time a day 100 each 12  . [DISCONTINUED] cetirizine (ZYRTEC) 10 MG tablet Take 1 tablet (10 mg total) by mouth daily. (Patient not taking: Reported on 11/02/2019) 30 tablet 1   No current facility-administered medications on file prior to visit.    ALLERGIES: Allergies  Allergen Reactions  . Aspirin Hives  . Oxycodone Nausea And Vomiting    FAMILY HISTORY: Family History  Problem  Relation Age of Onset  . Diabetes Mother   . Mental illness Mother   . Depression Mother   . Hypertension Mother   . Hypertension Father   . Diabetes Father     SOCIAL HISTORY: Social History   Socioeconomic History  . Marital status: Significant Other    Spouse name: Not on file  . Number of children: 3  . Years of education: 40  . Highest education level: Not on file  Occupational History  . Not on file  Tobacco Use  . Smoking status: Former Smoker    Packs/day: 0.25    Years: 8.00    Pack years: 2.00    Types: Cigarettes  . Smokeless tobacco: Never Used  Vaping Use  . Vaping Use: Never used  Substance and Sexual Activity  . Alcohol use: Yes    Comment: occasional  . Drug use: No  . Sexual activity: Yes    Birth control/protection: Surgical  Other Topics Concern  . Not on file  Social History Narrative   Patient lives at home with mother  and father , one story    Patient has 3 children    Patient is single   Patient has 14 years of education    Patient is right handed    Social Determinants of Health   Financial Resource Strain: Not on file  Food Insecurity: Food Insecurity Present  . Worried About Charity fundraiser in the Last Year: Sometimes true  . Ran Out of Food in the Last Year: Often true  Transportation Needs: No Transportation Needs  . Lack of Transportation (Medical): No  . Lack of Transportation (Non-Medical): No  Physical Activity: Not on file  Stress: Not on file  Social Connections: Not on file  Intimate Partner Violence: Not on file     Objective:  Blood pressure 117/81, pulse (!) 105, height 5' 3"  (1.6 m), weight 210 lb 3.2 oz (95.3 kg), SpO2 98 %. General: No acute distress.  Patient appears well-groomed.   Head:  Normocephalic/atraumatic Eyes:  Fundi examined but not visualized Neck: supple, no paraspinal tenderness, full range of motion Heart:  Regular rate and rhythm Lungs:  Clear to auscultation bilaterally Back: No  paraspinal tenderness Neurological Exam: alert and oriented to person, place, and time. Attention span and concentration intact, recent and remote memory intact, fund of knowledge intact.  Speech fluent and not dysarthric, language intact.  CN II-XII intact. Bulk and tone normal, muscle strength 5/5 throughout.  Sensation to light touch intact.  Deep tendon reflexes 2+ throughout, toes downgoing.  Finger to nose and heel to shin testing intact.  Gait normal, Romberg negative.   Assessment/Plan:  1.  Chronic migraine without aura, without status migrainosus, intractable - complicated by co-morbidities and medication overuse 2.  Type 2 diabetes mellitus, uncontrolled with DKA  1.  Migraine prevention:  Increase nortriptyline to 85m at bedtime 2.  Migraine rescue: Discontinue rizatriptan.  Will try sumatriptan 27mNS 3.  Limit use of pain relievers to no more than 2 days out of week to prevent risk of rebound or medication-overuse headache. 4.  Keep headache diary 5.  Diabetes management as per internal medicine 6.  Follow up in 6 months   AdMetta ClinesDO  CC:  DeKarle PlumberMD

## 2021-02-08 ENCOUNTER — Other Ambulatory Visit: Payer: Self-pay | Admitting: Neurology

## 2021-02-08 ENCOUNTER — Encounter: Payer: Self-pay | Admitting: Neurology

## 2021-02-08 ENCOUNTER — Ambulatory Visit (INDEPENDENT_AMBULATORY_CARE_PROVIDER_SITE_OTHER): Payer: Self-pay | Admitting: Neurology

## 2021-02-08 VITALS — BP 117/81 | HR 105 | Ht 63.0 in | Wt 210.2 lb

## 2021-02-08 DIAGNOSIS — G43719 Chronic migraine without aura, intractable, without status migrainosus: Secondary | ICD-10-CM

## 2021-02-08 DIAGNOSIS — E1110 Type 2 diabetes mellitus with ketoacidosis without coma: Secondary | ICD-10-CM

## 2021-02-08 MED ORDER — SUMATRIPTAN 20 MG/ACT NA SOLN: 20.0000 mg | NASAL | 5 refills | Status: DC | PRN

## 2021-02-08 MED ORDER — NORTRIPTYLINE HCL 50 MG PO CAPS: 50.0000 mg | ORAL_CAPSULE | Freq: Every day | ORAL | 5 refills | Status: DC

## 2021-02-08 MED FILL — NORTRIPTYLINE HCL 50 MG CAP: 50 | 30 days supply | Qty: 30 | Fill #0

## 2021-02-08 NOTE — Patient Instructions (Signed)
  1. Increase nortriptyline to 50mg  at bedtime 2. Stop rizatriptan.  Take sumatriptan nasal spray at earliest onset of headache.  May repeat dose once in 2 hours if needed.  Maximum 2 sprays in 24 hours. 3. Limit use of pain relievers to no more than 2 days out of the week.  These medications include acetaminophen, NSAIDs (ibuprofen/Advil/Motrin, naproxen/Aleve, triptans (Imitrex/sumatriptan), Excedrin, and narcotics.  This will help reduce risk of rebound headaches. 4. Be aware of common food triggers:  - Caffeine:  coffee, black tea, cola, Mt. Dew  - Chocolate  - Dairy:  aged cheeses (brie, blue, cheddar, gouda, [place], provolone, [place], Swiss, etc), chocolate milk, buttermilk, sour cream, limit eggs and yogurt  - Nuts, peanut butter  - Alcohol  - Cereals/grains:  FRESH breads (fresh bagels, sourdough, doughnuts), yeast productions  - Processed/canned/aged/cured meats (pre-packaged deli meats, hotdogs)  - MSG/glutamate:  soy sauce, flavor enhancer, pickled/preserved/marinated foods  - Sweeteners:  aspartame (Equal, Nutrasweet).  Sugar and Splenda are okay  - Vegetables:  legumes (lima beans, lentils, snow peas, fava beans, pinto peans, peas, garbanzo beans), sauerkraut, onions, olives, pickles  - Fruit:  avocados, bananas, citrus fruit (orange, lemon, grapefruit), mango  - Other:  Frozen meals, macaroni and cheese 5. Routine exercise 6. Stay adequately hydrated (aim for 64 oz water daily) 7. Keep headache diary 8. Maintain proper stress management 9. Maintain proper sleep hygiene 10. Do not skip meals 11. Consider supplements:  magnesium citrate 400mg  daily, riboflavin 400mg  daily, coenzyme Q10 100mg  three times daily.

## 2021-02-09 ENCOUNTER — Other Ambulatory Visit: Payer: Self-pay | Admitting: Neurology

## 2021-02-09 ENCOUNTER — Telehealth: Payer: Self-pay

## 2021-02-09 MED ORDER — SUMATRIPTAN SUCCINATE 100 MG PO TABS: ORAL_TABLET | ORAL | 5 refills | Status: DC

## 2021-02-09 MED FILL — TRUE METRIX GLUCOSE TEST ST: 30 days supply | Qty: 100 | Fill #1

## 2021-02-09 MED FILL — SUMAtriptan SUCCINATE 100 M: 100 | 30 days supply | Qty: 10 | Fill #0

## 2021-02-09 NOTE — Telephone Encounter (Signed)
Fax received from [facility]. Message: Please change to tablet if appropriate. Mediation too expensive for the pt to pick up with insurance.     Please advise

## 2021-02-09 NOTE — Telephone Encounter (Signed)
I sent prescription for sumatriptan tablet.  I prescribed 100mg .  She previously tried 50mg , so hopefully the higher dose will be more effective.

## 2021-02-09 NOTE — Telephone Encounter (Signed)
LMOVM, pt to call back.

## 2021-02-12 ENCOUNTER — Telehealth (HOSPITAL_COMMUNITY): Payer: Self-pay | Admitting: Internal Medicine

## 2021-02-12 ENCOUNTER — Telehealth: Payer: Self-pay | Admitting: Neurology

## 2021-02-12 ENCOUNTER — Other Ambulatory Visit: Payer: Self-pay

## 2021-02-12 NOTE — Telephone Encounter (Signed)
Left message on patients voicemail that insurance would not pay for nasal spray per [facility], Rx was changed by [facility]

## 2021-02-12 NOTE — Telephone Encounter (Signed)
Care Management - Follow Up [facility] Discharges   Writer attempted to make contact with patient today and was unsuccessful.  Writer was able to leave a HIPPA compliant voice message and will await callback.  Per chart review, patient has a follow up phone appointment for medication management with Burt Ek, NP

## 2021-02-12 NOTE — Telephone Encounter (Signed)
Patient called and said she was given the wrong medication from the pharmacy. She said she was given sumatriptan pills and she was supposed to get a nasal spray.   [facility]

## 2021-02-14 ENCOUNTER — Other Ambulatory Visit: Payer: Medicaid Other

## 2021-02-14 NOTE — Telephone Encounter (Signed)
Patient returned call

## 2021-02-15 NOTE — Telephone Encounter (Signed)
Left message with pt mother Please call the office back.

## 2021-02-27 ENCOUNTER — Other Ambulatory Visit: Payer: Self-pay | Admitting: Internal Medicine

## 2021-02-27 DIAGNOSIS — E1165 Type 2 diabetes mellitus with hyperglycemia: Secondary | ICD-10-CM

## 2021-02-27 DIAGNOSIS — Z794 Long term (current) use of insulin: Secondary | ICD-10-CM

## 2021-02-27 MED FILL — hydrOXYzine HCL 50 MG TABS: 50 | 30 days supply | Qty: 30 | Fill #0

## 2021-02-27 MED FILL — $novoLOG FLEXPEN SYRINGE: 100 | 26 days supply | Qty: 15 | Fill #0

## 2021-02-27 MED FILL — ?TRULICITY 0.75 MG/0.5ML PE: 0.75 | 28 days supply | Qty: 2 | Fill #1

## 2021-03-05 ENCOUNTER — Other Ambulatory Visit: Payer: Self-pay

## 2021-03-05 ENCOUNTER — Other Ambulatory Visit: Payer: Self-pay | Admitting: Internal Medicine

## 2021-03-05 ENCOUNTER — Ambulatory Visit: Payer: Self-pay | Attending: Internal Medicine | Admitting: Pharmacist

## 2021-03-05 ENCOUNTER — Encounter: Payer: Self-pay | Admitting: Pharmacist

## 2021-03-05 DIAGNOSIS — E1165 Type 2 diabetes mellitus with hyperglycemia: Secondary | ICD-10-CM

## 2021-03-05 DIAGNOSIS — Z794 Long term (current) use of insulin: Secondary | ICD-10-CM

## 2021-03-05 LAB — GLUCOSE, POCT (MANUAL RESULT ENTRY): POC Glucose: 308 mg/dl — AB (ref 70–99)

## 2021-03-05 MED ORDER — TRULICITY 1.5 MG/0.5ML ~~LOC~~ SOAJ: 1.5000 mg | SUBCUTANEOUS | 2 refills | Status: DC

## 2021-03-05 MED FILL — ?TRULICITY 1.5 MG/0.5 ML PE: 1.5 | 28 days supply | Qty: 2 | Fill #0

## 2021-03-05 NOTE — Progress Notes (Signed)
S:    PCP: Dr. Wynetta Emery   No chief complaint on file.  Patient arrives in good spirits.  Presents for diabetes evaluation, education, and management. Patient was referred and last seen by [name] on 01/30/21. Last seen by PCP on  06/08/2020.  Today patient arrives reporting feeling well and reports that she has done well with Trulicity since starting about a month ago. Pt denies N/V, abdominal pain. Pt notices that her sugars are doing well overall.  Family History: Diabetes (mother and father), HTN (mother and father) Smoking: former smoker 0.25 PPD for 8 years Alcohol: occasional use  Insurance coverage/medication affordability: none  Medication adherence reported.   Current diabetes medications include: Novolog 22-12-22 TID before meals, lantus 56 units every morning, trulicity 0.75mg  weekly  Patient denies hypoglycemic events. Pt is aware of hypoglycemic symptoms and knows how to treat appropriately.  Patient reported dietary habits:  - Pt endorses trying to limit carbs (potatoes, french fries) - Uses air fryer for most meals to limit fats  Patient-reported exercise habits: limited exercise at the moment due to taking care of her mother   Patient denies nocturia (nighttime urination).  Patient denies neuropathy (nerve pain). Patient denies visual changes. Patient denies self foot exams.    O:  POCT glucose: 308 (of note, pt has not taken any medication this morning. She is also fasting)   Lab Results  Component Value Date   HGBA1C  06/08/2020     Comment:     >15.0   There were no vitals filed for this visit.  Lipid Panel     Component Value Date/Time   CHOL 159 02/04/2020 0955   TRIG 117 02/04/2020 0955   HDL 46 02/04/2020 0955   CHOLHDL 3.5 02/04/2020 0955   CHOLHDL 3.2 11/14/2014 0707   VLDL 19 11/14/2014 0707   LDLCALC 92 02/04/2020 0955   Home fasting blood sugars: low 200s  2 hour post-meal/random blood sugars: 150s  These are pt reported  values since starting Trulicity. Pt does not have meter with her today.  Clinical Atherosclerotic Cardiovascular Disease (ASCVD): No   The 10-year ASCVD risk score Mikey Bussing DC Jr., et al., 2013) is: 6.5%   Values used to calculate the score:     Age: 50 years     Sex: Female     Is Non-Hispanic African American: Yes     Diabetic: Yes     Tobacco smoker: Yes     Systolic Blood Pressure: 694 mmHg     Is BP treated: No     HDL Cholesterol: 46 mg/dL     Total Cholesterol: 159 mg/dL    A/P: Diabetes longstanding currently uncontrolled. Most recent A1C was >15 (about a year ago). Repeat A1C done today. Medication optimization will be needed to further reduce A1C and improve diabetes control. Patient is able to verbalize appropriate hypoglycemia management plan. Medication adherence reported.  -Continued novolog TID 22-12-22 before meals -Continued  lantus 56 units QAM  - Increased Trulicity to 1.5 mg weekly -Extensively discussed pathophysiology of diabetes, recommended lifestyle interventions, dietary effects on blood sugar control -Counseled on s/sx of and management of hypoglycemia -Next A1C anticipated 05/2021.   Written patient instructions provided.  Total time in face to face counseling 15 minutes.   Follow up Pharmacist Clinic Visit in 1 month.     Patient seen with: Cheree Ditto, PharmD Candidate [employer] 20[employer]  Benard Halsted, PharmD, [place], [place]  [place] [phone]

## 2021-03-06 LAB — HEMOGLOBIN A1C
Est. average glucose Bld gHb Est-mCnc: 329 mg/dL
Hgb A1c MFr Bld: 13.1 % — ABNORMAL HIGH (ref 4.8–5.6)

## 2021-03-09 ENCOUNTER — Other Ambulatory Visit: Payer: Self-pay | Admitting: Internal Medicine

## 2021-03-09 DIAGNOSIS — F32A Depression, unspecified: Secondary | ICD-10-CM

## 2021-03-12 ENCOUNTER — Other Ambulatory Visit: Payer: Self-pay | Admitting: Nurse Practitioner

## 2021-03-12 DIAGNOSIS — M62838 Other muscle spasm: Secondary | ICD-10-CM

## 2021-03-12 MED FILL — ?ATORVASTATIN 20 MG TABLET: 20 | 30 days supply | Qty: 30 | Fill #0

## 2021-03-12 MED FILL — NORTRIPTYLINE HCL 50 MG CAP: 50 | 30 days supply | Qty: 30 | Fill #1

## 2021-03-12 MED FILL — DULoxetine HCL 60 MG CPEP: 60 | 30 days supply | Qty: 30 | Fill #0

## 2021-03-12 NOTE — Telephone Encounter (Signed)
Requested medication (s) are due for refill today:  yes  Requested medication (s) are on the active medication list: yes  Last refill:  01/30/2021  Future visit scheduled: yes  Notes to clinic:  this refill cannot be delegated    Requested Prescriptions  Pending Prescriptions Disp Refills   cyclobenzaprine (FLEXERIL) 10 MG tablet [Pharmacy Med Name: CYCLOBENZAPRINE 10 MG TAB 10 Tablet] 30 tablet 0    Sig: Take 1 tablet (10 mg total) by mouth daily as needed for muscle spasms. Must have office visit for refills      Not Delegated - Analgesics:  Muscle Relaxants Failed - 03/12/2021  8:59 AM      Failed - This refill cannot be delegated      Passed - Valid encounter within last 6 months    Recent Outpatient Visits           1 week ago Type 2 diabetes mellitus with hyperglycemia, with long-term current use of insulin [facility])   [place], RPH-CPP   1 month ago Hospital discharge follow-up   [place], [place] W, NP   9 months ago Type 2 diabetes mellitus with hyperglycemia, with long-term current use of insulin [facility])   [place] Karle Plumber B, MD   10 months ago Migraine without aura and with status migrainosus, not intractable   [place] Karle Plumber B, MD   11 months ago Type 2 diabetes mellitus with hyperglycemia, with long-term current use of insulin [facility])   [place], Deborah B, MD       Future Appointments             In 3 weeks Tresa Endo, [place]   In 1 month Wynetta Emery, Dalbert Batman, MD [place]

## 2021-03-13 ENCOUNTER — Other Ambulatory Visit: Payer: Self-pay | Admitting: Internal Medicine

## 2021-03-13 MED FILL — CYCLOBENZAPRINE 10 MG TAB: 10 | 30 days supply | Qty: 30 | Fill #0

## 2021-03-22 ENCOUNTER — Other Ambulatory Visit (HOSPITAL_COMMUNITY): Payer: Self-pay | Admitting: Psychiatry

## 2021-03-22 ENCOUNTER — Other Ambulatory Visit: Payer: Self-pay

## 2021-03-22 ENCOUNTER — Ambulatory Visit (INDEPENDENT_AMBULATORY_CARE_PROVIDER_SITE_OTHER): Payer: No Payment, Other | Admitting: Psychiatry

## 2021-03-22 ENCOUNTER — Encounter (HOSPITAL_COMMUNITY): Payer: Self-pay | Admitting: Psychiatry

## 2021-03-22 VITALS — BP 125/97 | HR 122 | Ht 63.0 in | Wt 213.0 lb

## 2021-03-22 DIAGNOSIS — F4381 Prolonged grief disorder: Secondary | ICD-10-CM | POA: Insufficient documentation

## 2021-03-22 DIAGNOSIS — F411 Generalized anxiety disorder: Secondary | ICD-10-CM | POA: Insufficient documentation

## 2021-03-22 DIAGNOSIS — F1994 Other psychoactive substance use, unspecified with psychoactive substance-induced mood disorder: Secondary | ICD-10-CM | POA: Insufficient documentation

## 2021-03-22 DIAGNOSIS — F4329 Adjustment disorder with other symptoms: Secondary | ICD-10-CM | POA: Diagnosis not present

## 2021-03-22 MED ORDER — DULOXETINE HCL 60 MG PO CPEP: 60.0000 mg | ORAL_CAPSULE | Freq: Every day | ORAL | 2 refills | Status: DC

## 2021-03-22 MED ORDER — HYDROXYZINE HCL 25 MG PO TABS: 25.0000 mg | ORAL_TABLET | Freq: Three times a day (TID) | ORAL | 2 refills | Status: DC | PRN

## 2021-03-22 MED ORDER — QUETIAPINE FUMARATE 50 MG PO TABS: 50.0000 mg | ORAL_TABLET | Freq: Every day | ORAL | 2 refills | Status: DC

## 2021-03-22 MED FILL — QUETIAPINE FUMARATE 50 MG T: 50 | 30 days supply | Qty: 30 | Fill #0

## 2021-03-22 MED FILL — hydrOXYzine HCL 25 MG TABS: 25 | 30 days supply | Qty: 90 | Fill #0

## 2021-03-22 NOTE — Progress Notes (Signed)
Psychiatric Initial Adult Assessment   Patient Identification: [place] MRN:  [id number] Date of Evaluation:  03/22/2021 Referral Source: Karle Plumber, MD. Chief Complaint:  " I have a lot of grief and irritability" Chief Complaint    Medication Management     Visit Diagnosis:    ICD-10-CM   1. Substance induced mood disorder ([employer])  F19.94 QUEtiapine (SEROQUEL) 50 MG tablet    DULoxetine (CYMBALTA) 60 MG capsule  2. Generalized anxiety disorder  F41.1 QUEtiapine (SEROQUEL) 50 MG tablet    hydrOXYzine (ATARAX/VISTARIL) 25 MG tablet    DULoxetine (CYMBALTA) 60 MG capsule  3. Grief reaction with prolonged bereavement  F43.29     History of Present Illness: 50 year old female seen today for initial psychiatric evaluation.  He was referred to outpatient psychiatry by her primary care doctor for medication management.  She has a psychiatric history of anxiety and depression.  She is currently managed on trazodone 50 mg nightly, hydroxyzine 50 mg nightly, and Cymbalta 60 mg daily.  She notes her medications are somewhat effective in managing her psychiatric condition.  Today patient was pleasant, cooperative, engaged in conversation, and maintained eye contact.  She notes that she fears that her mother will die.  She notes that she has had this feeling for a while because her mother's health is declining and she is still grieving the loss of her father who died 2 years ago.  She notes that she has not cleared away her father's items from his room.  She also notes that when she visits her mother's home she smells her father's and feels his presence.  Patient also notes that her husband died in 36 which she notes still saddens her.  She informed provider that she is dating currently.  Patient notes that she is irritable most days, has fluctuations in her mood, racing thoughts, and is distractible.  She informed Probation officer to cope with these feelings she smokes marijuana frequently.  She notes  that it calms her down.  Patient also notes that she has poor sleep.  She informed provider that she wakes up every hour.  Today provider conducted a GAD-7 and patient scored a 20.  She notes that she worries about her health (noting that she has generalized pain in her body and diabetes).  Provider also conducted a PHQ-9 and patient scored a 23.  Patient endorses depressed mood, anhedonia, psychomotor agitation, feelings of worthlessness, thoughts of death, panic attacks, decreased energy, and decreased appetite.  Today she denies SI/HI/VAH or paranoia.  Today patient notes that trazodone has been ineffective.  She also notes that she took hydroxyzine for sleep which was ineffective as well.  Today she is agreeable to discontinuing trazodone 50 mg and starting Seroquel 50 mg to help manage sleep, depression, anxiety, and appetite.  She is also agreeable to increase hydroxyzine 50 mg nightly to 25 mg 3 times daily to help manage anxiety. Potential side effects of medication and risks vs benefits of treatment vs non-treatment were explained and discussed. All questions were answered.  She will follow up with outpatient counseling for therapy.  No other concerns noted at this time.  Associated Signs/Symptoms: Depression Symptoms:  depressed mood, anhedonia, insomnia, psychomotor agitation, feelings of worthlessness/guilt, difficulty concentrating, hopelessness, recurrent thoughts of death, suicidal attempt, anxiety, panic attacks, loss of energy/fatigue, decreased appetite, (Hypo) Manic Symptoms:  Distractibility, Elevated Mood, Flight of Ideas, Irritable Mood, Anxiety Symptoms:  Excessive Worry, Panic Symptoms, Psychotic Symptoms:  Denies PTSD Symptoms: Had a traumatic exposure:  Notes that her fathers death was traumatic  Past Psychiatric History: Anxiety and depression  Previous Psychotropic Medications: Trazodone, cymbalta, hydroxyzine  Substance Abuse History in the last 12 months:   Yes.    Consequences of Substance Abuse: NA  Past Medical History:  Past Medical History:  Diagnosis Date  . Anxiety   . Depression   . Diabetes mellitus without complication ([place])   . Trichomonas infection     Past Surgical History:  Procedure Laterality Date  . CESAREAN SECTION    . VAGINAL HYSTERECTOMY  2006   Fibroids, menorrhagia, benign pathology    Family Psychiatric History: Mother schizophrenia and bipolar disorder  Family History:  Family History  Problem Relation Age of Onset  . Diabetes Mother   . Mental illness Mother   . Depression Mother   . Hypertension Mother   . Hypertension Father   . Diabetes Father     Social History:   Social History   Socioeconomic History  . Marital status: Significant Other    Spouse name: Not on file  . Number of children: 3  . Years of education: 63  . Highest education level: Not on file  Occupational History  . Not on file  Tobacco Use  . Smoking status: Former Smoker    Packs/day: 0.25    Years: 8.00    Pack years: 2.00    Types: Cigarettes  . Smokeless tobacco: Never Used  Vaping Use  . Vaping Use: Never used  Substance and Sexual Activity  . Alcohol use: Yes    Comment: occasional  . Drug use: No  . Sexual activity: Yes    Birth control/protection: Surgical  Other Topics Concern  . Not on file  Social History Narrative   Patient lives at home with mother and father , one story    Patient has 3 children    Patient is single   Patient has 14 years of education    Patient is right handed    Social Determinants of Health   Financial Resource Strain: Not on file  Food Insecurity: Food Insecurity Present  . Worried About Charity fundraiser in the Last Year: Sometimes true  . Ran Out of Food in the Last Year: Often true  Transportation Needs: No Transportation Needs  . Lack of Transportation (Medical): No  . Lack of Transportation (Non-Medical): No  Physical Activity: Not on file  Stress: Not on  file  Social Connections: Not on file    Additional Social History: Patient resides in [place].  She has been dating her significant other for 4 years.  She has 3 older children.  She endorses daily marijuana use.  She denies alcohol or tobacco use.  She notes currently she is unemployed and attempting to receive disability.  Allergies:   Allergies  Allergen Reactions  . Aspirin Hives  . Oxycodone Nausea And Vomiting    Metabolic Disorder Labs: Lab Results  Component Value Date   HGBA1C 13.1 (H) 03/05/2021   MPG 380.93 10/02/2019   MPG 289.09 01/06/2019   No results found for: PROLACTIN Lab Results  Component Value Date   CHOL 159 02/04/2020   TRIG 117 02/04/2020   HDL 46 02/04/2020   CHOLHDL 3.5 02/04/2020   VLDL 19 11/14/2014   LDLCALC 92 02/04/2020   LDLCALC 124 (H) 02/04/2019   Lab Results  Component Value Date   TSH 0.014 (L) 10/02/2019    Therapeutic Level Labs: No results found for: LITHIUM No results found  for: CBMZ No results found for: VALPROATE  Current Medications: Current Outpatient Medications  Medication Sig Dispense Refill  . cyclobenzaprine (FLEXERIL) 10 MG tablet TAKE 1 TABLET (10 MG TOTAL) BY MOUTH DAILY AS NEEDED FOR MUSCLE SPASMS. MUST HAVE OFFICE VISIT FOR REFILLS 30 tablet 0  . QUEtiapine (SEROQUEL) 50 MG tablet Take 1 tablet (50 mg total) by mouth at bedtime. 30 tablet 2  . atorvastatin (LIPITOR) 20 MG tablet TAKE 1 TABLET (20 MG TOTAL) BY MOUTH DAILY. 30 tablet 3  . Blood Glucose Monitoring Suppl (TRUE METRIX METER) w/Device KIT Check blood sugars three times a day 1 kit 0  . Dulaglutide (TRULICITY) 1.5 NV/9.51YO SOPN Inject 1.5 mg into the skin once a week. 2 mL 2  . DULoxetine (CYMBALTA) 60 MG capsule Take 1 capsule (60 mg total) by mouth daily. 30 capsule 2  . glucose blood test strip Check blood sugars three times a day 100 each 12  . hydrOXYzine (ATARAX/VISTARIL) 25 MG tablet Take 1 tablet (25 mg total) by mouth 3 (three) times daily  as needed. 90 tablet 2  . insulin glargine (LANTUS SOLOSTAR) 100 UNIT/ML Solostar Pen Inject 56 Units into the skin at bedtime. (Patient taking differently: Inject 52 Units into the skin at bedtime.) 15 mL 4  . Insulin Pen Needle (PEN NEEDLES) 31G X 6 MM MISC Use as directed 100 each 0  . melatonin 3 MG TABS tablet Take 1 tablet (3 mg total) by mouth at bedtime. 30 tablet 2  . Multiple Vitamin (MULTIVITAMIN ADULT) TABS Take 1 tablet by mouth daily.    . nortriptyline (PAMELOR) 50 MG capsule Take 1 capsule (50 mg total) by mouth at bedtime. 30 capsule 5  . NOVOLOG FLEXPEN 100 UNIT/ML FlexPen INJECT 22 UNITS BEFORE BREAKFAST, 12 UNITS BEFORE LUNCH, AND 22 UNITS BEFORE DINNER. 15 mL 0  . ondansetron (ZOFRAN ODT) 4 MG disintegrating tablet Take 1 tablet (4 mg total) by mouth every 8 (eight) hours as needed for nausea or vomiting. 20 tablet 0  . pantoprazole (PROTONIX) 20 MG tablet Take 2 tablets (40 mg total) by mouth daily for 14 days. 28 tablet 0  . SUMAtriptan (IMITREX) 100 MG tablet Take 1 tablet earliest onset of migraine.  May repeat in 2 hours if headache persists or recurs.  Maximum 2 tablets in 24 hours. 10 tablet 5  . topiramate (TOPAMAX) 100 MG tablet Take 1 tablet (100 mg total) by mouth at bedtime. (Patient not taking: No sig reported) 30 tablet 3  . TRUEPLUS LANCETS 28G MISC Check blood sugars three time a day 100 each 12   No current facility-administered medications for this visit.    Musculoskeletal: Strength & Muscle Tone: within normal limits Gait & Station: normal Patient leans: N/A  Psychiatric Specialty Exam: Review of Systems  Blood pressure (!) 125/97, pulse (!) 122, height 5' 3" (1.6 m), weight 213 lb (96.6 kg), SpO2 98 %.Body mass index is 37.73 kg/m.  General Appearance: Well Groomed  Eye Contact:  Good  Speech:  Clear and Coherent and Normal Rate  Volume:  Normal  Mood:  Anxious and Depressed  Affect:  Appropriate and Congruent  Thought Process:  Coherent, Goal  Directed and Linear  Orientation:  Full (Time, Place, and Person)  Thought Content:  WDL and Logical  Suicidal Thoughts:  No  Homicidal Thoughts:  No  Memory:  Immediate;   Good Recent;   Good Remote;   Good  Judgement:  Good  Insight:  Good  Psychomotor  Activity:  Normal  Concentration:  Concentration: Good and Attention Span: Good  Recall:  Good  Fund of Knowledge:Good  Language: Good  Akathisia:  No  Handed:  Right  AIMS (if indicated):  Not done  Assets:  Communication Skills Desire for Improvement Financial Resources/Insurance Housing Intimacy Social Support  ADL's:  Intact  Cognition: WNL  Sleep:  Poor   Screenings: GAD-7   Flowsheet Row Office Visit from 03/22/2021 in [facility] Clinical Support from 01/30/2021 in [facility] for [employer] [facility] Office Visit from 10/06/2020 in [place] for [employer] at [facility] Office Visit from 06/08/2020 in [place] Office Visit from 02/04/2020 in [place]  Total GAD-7 Score _0 PHQ2-9   [place] Office Visit from 03/22/2021 in [facility] ED from 02/05/2021 in [place] from 01/30/2021 in [facility] for [place] [facility] Office Visit from 10/06/2020 in [facility] for [place] at [facility] Office Visit from 06/08/2020 in [place]  PHQ-2 Total Score _1 PHQ-9 Total Score _2 [place] Office Visit from 03/22/2021 in [facility] ED from 02/05/2021 in [facility] ED to Hosp-Admission (Discharged) from 01/23/2021 in [place] [facility]  C-SSRS RISK CATEGORY No Risk No Risk No Risk      Assessment and Plan:  Patient endorses symptoms of anxiety, depression, marijuana use, insomnia, and grief.  Patient notes that trazodone has been ineffective and would like to discontinue it.  She is agreeable to starting Seroquel 50 mg to help manage sleep, anxiety, and depression.  She is also agreeable to increasing hydroxyzine 50 mg nightly to 25 mg 3 times daily to help manage anxiety.  She will continue all other medication as prescribed.  1. Substance induced mood disorder ([facility])  Start- QUEtiapine (SEROQUEL) 50 MG tablet; Take 1 tablet (50 mg total) by mouth at bedtime.  Dispense: 30 tablet; Refill: 2 Continue- DULoxetine (CYMBALTA) 60 MG capsule; Take 1 capsule (60 mg total) by mouth daily.  Dispense: 30 capsule; Refill: 2  2. Generalized anxiety disorder  Start- QUEtiapine (SEROQUEL) 50 MG tablet; Take 1 tablet (50 mg total) by mouth at bedtime.  Dispense: 30 tablet; Refill: 2 Increase- hydrOXYzine (ATARAX/VISTARIL) 25 MG tablet; Take 1 tablet (25 mg total) by mouth 3 (three) times daily as needed.  Dispense: 90 tablet; Refill: 2 Continue- DULoxetine (CYMBALTA) 60 MG capsule; Take 1 capsule (60 mg total) by mouth daily.  Dispense: 30 capsule; Refill: 2  3. Grief reaction with prolonged bereavement   Follow-up in 31-monthFollow-up with therapy   BSalley Slaughter NP 3/31/20222:24 PM

## 2021-03-28 MED FILL — Dulaglutide Soln Auto-injector 1.5 MG/0.5ML: SUBCUTANEOUS | 28 days supply | Qty: 2 | Fill #0 | Status: AC

## 2021-03-29 ENCOUNTER — Other Ambulatory Visit: Payer: Self-pay

## 2021-03-30 ENCOUNTER — Other Ambulatory Visit: Payer: Self-pay

## 2021-03-30 ENCOUNTER — Ambulatory Visit (INDEPENDENT_AMBULATORY_CARE_PROVIDER_SITE_OTHER): Payer: No Payment, Other | Admitting: Licensed Clinical Social Worker

## 2021-03-30 DIAGNOSIS — F331 Major depressive disorder, recurrent, moderate: Secondary | ICD-10-CM

## 2021-04-02 ENCOUNTER — Ambulatory Visit: Payer: Self-pay | Attending: Internal Medicine | Admitting: Pharmacist

## 2021-04-02 ENCOUNTER — Other Ambulatory Visit: Payer: Self-pay

## 2021-04-02 DIAGNOSIS — E1165 Type 2 diabetes mellitus with hyperglycemia: Secondary | ICD-10-CM

## 2021-04-02 DIAGNOSIS — Z794 Long term (current) use of insulin: Secondary | ICD-10-CM

## 2021-04-02 LAB — GLUCOSE, POCT (MANUAL RESULT ENTRY): POC Glucose: 239 mg/dl — AB (ref 70–99)

## 2021-04-02 NOTE — Progress Notes (Signed)
    S:    PCP: Dr. Wynetta Emery   No chief complaint on file.  Patient arrives in good spirits.  Presents for diabetes evaluation, education, and management. Patient was referred and last seen by [name] on 01/30/21. I saw her on 03/05/2021 and increased her Trulicity to 1.5 mg weekly. Today, patient reports feeling well and reports that she has done well with the Trulicity dose increase. Pt denies N/V, abdominal pain.   Family History: Diabetes (mother and father), HTN (mother and father) Smoking: former smoker 0.25 PPD for 8 years Alcohol: occasional use  Insurance coverage/medication affordability: none  Medication adherence reported.   Current diabetes medications include: Novolog 22-12-22 TID before meals, lantus 56 units every morning, trulicity 1.5 mg weekly  Patient reports hypoglycemic events when increasing the Trulicity to 1.5. Gives several readings in the 50s-60s that occur mostly between breakfast and lunch. She does not eat a large breakfast. She treats successfully.   Patient reported dietary habits:  - Pt endorses trying to limit carbs (potatoes, french fries) - Uses air fryer for most meals to limit fats  Patient-reported exercise habits: limited exercise at the moment due to taking care of her mother   Patient denies nocturia (nighttime urination).  Patient denies neuropathy (nerve pain). Patient denies visual changes. Patient denies self foot exams.    O:  POCT glucose: 239 (fasting - no medications)  Lab Results  Component Value Date   HGBA1C 13.1 (H) 03/05/2021   There were no vitals filed for this visit.  Lipid Panel     Component Value Date/Time   CHOL 159 02/04/2020 0955   TRIG 117 02/04/2020 0955   HDL 46 02/04/2020 0955   CHOLHDL 3.5 02/04/2020 0955   CHOLHDL 3.2 11/14/2014 0707   VLDL 19 11/14/2014 0707   LDLCALC 92 02/04/2020 0955   Home fasting blood sugars: low 200s  2 hour post-meal/random blood sugars: 150s  These are pt reported  values since starting Trulicity. Pt does not have meter with her today.  Clinical Atherosclerotic Cardiovascular Disease (ASCVD): No   The 10-year ASCVD risk score Mikey Bussing DC Jr., et al., 2013) is: 8.4%   Values used to calculate the score:     Age: 50 years     Sex: Female     Is Non-Hispanic African American: Yes     Diabetic: Yes     Tobacco smoker: Yes     Systolic Blood Pressure: 412 mmHg     Is BP treated: No     HDL Cholesterol: 46 mg/dL     Total Cholesterol: 159 mg/dL    A/P: Diabetes longstanding currently uncontrolled. Medication optimization will be needed to further reduce A1C and improve diabetes control. Patient is able to verbalize appropriate hypoglycemia management plan. Medication adherence reported. Her AM blood sugar levels between breakfast and lunch are low. Will reduce Novolog to 20-12-22. -Decreased Novolog TID to 20-12-22 before meals -Continued Lantus 56 units QAM  - Continued Trulicity to 1.5 mg weekly -Extensively discussed pathophysiology of diabetes, recommended lifestyle interventions, dietary effects on blood sugar control -Counseled on s/sx of and management of hypoglycemia -Next A1C anticipated 05/2021.   Written patient instructions provided.  Total time in face to face counseling 15 minutes.   Follow up Pharmacist Clinic Visit in 2 weeks.     Patient seen with: Cheree Ditto, PharmD Candidate [employer]  Benard Halsted, PharmD, [place], [place] [phone]

## 2021-04-03 NOTE — Progress Notes (Signed)
   THERAPIST PROGRESS NOTE  Session Time: 45 min  Participation Level: Active  Behavioral Response: Casual and Well GroomedAlertDepressed  Type of Therapy: Individual Therapy  Treatment Goals addressed: Communication: Depression/Grief  Interventions: Supportive and Other: Assessment  Summary: Whitney [place] is a 50 y.o. female who presents with hx of MDD. This date pt comes for initial session with this clinician who was recommended for pt needs r/t grief/loss. P thad full CCA done 02/05/21. Pt endorses feelings of dep since loss of spouse d/t stomach CA in 1999 when her three sons were 75, 17 and 2 yrs of age. Pt's children now 58, 52, 76. She states her 49 yr old is still at home with her and "gets on my nerves". Pt states she lost her father, with whom she was very close, as well as an uncle on mother's side of fam in 2020. Pt states her father was "my anchor". Pt advises she was hospitalized for ketoacidosis in ICU at same time father was in ICU not expected to live r/t kidney disease. Pt reports she was dx with DM in 2020. States she also has problems with migranes and GI problems. Pt stressed by loss of health and trying to navigate self care. Pt reports she is on 4 different insulins. Pt involved with caregiving for mother, 2 in Sep, and sister/sister's children who are 16, 64, 38, 10. Sister reportedly an irresponsible parent. Sounds as if children mostly being raised by pt's mother who reportedly has schizophrenia and Bipolar. Pt advises mother is medicated and she gets along with mother saying "I'm her right hand person". Pt states she promised her father she would care for mother. Pt endorses unresolved grief for father and states "Everyone's mad at me cause I haven't cleaned out his room". LCSW assessed for pt knowledge of stages of grief and normal grief expectations. Pt denies any knowledge of grief process. LCSW provided grief education and counseling. Provided grief literature for pt to  take with her. Reviewed poc including scheduling and walk in option prior to close of session. Pt verbalizes understanding and states appreciation for care.      Suicidal/Homicidal: Nowithout intent/plan  Therapist Response: Pt wishes to engage in ongoing counseling.  Plan: Return again for next avail appt.  Diagnosis: Axis I: MDD, moderate  Hermine Messick, LCSW 04/03/2021

## 2021-04-04 ENCOUNTER — Other Ambulatory Visit: Payer: Self-pay

## 2021-04-04 ENCOUNTER — Other Ambulatory Visit: Payer: Self-pay | Admitting: Internal Medicine

## 2021-04-04 MED ORDER — BASAGLAR KWIKPEN 100 UNIT/ML ~~LOC~~ SOPN
PEN_INJECTOR | SUBCUTANEOUS | 4 refills | Status: DC
  Filled 2021-04-04: qty 15, 26d supply, fill #0
  Filled 2021-04-13: qty 15, 27d supply, fill #0

## 2021-04-04 NOTE — Telephone Encounter (Signed)
Requested medication (s) are due for refill today: no  Requested medication (s) are on the active medication list: yes  Last refill:  01/29/2021  Future visit scheduled:yes  Notes to clinic:   review for refill    Requested Prescriptions  Pending Prescriptions Disp Refills   Insulin Glargine (BASAGLAR KWIKPEN) 100 UNIT/ML 15 mL 4    Sig: INJECT 56 UNITS INTO THE SKIN AT BEDTIME.      Endocrinology:  Diabetes - Insulins Failed - 04/04/2021  2:48 PM      Failed - HBA1C is between 0 and 7.9 and within 180 days    HbA1c, POC (controlled diabetic range)  Date Value Ref Range Status  06/08/2020   Final    Comment:    >15.0   Hgb A1c MFr Bld  Date Value Ref Range Status  03/05/2021 13.1 (H) 4.8 - 5.6 % Final    Comment:             Prediabetes: 5.7 - 6.4          Diabetes: >6.4          Glycemic control for adults with diabetes: <7.0           Passed - Valid encounter within last 6 months    Recent Outpatient Visits           2 days ago Type 2 diabetes mellitus with hyperglycemia, with long-term current use of insulin [facility])   [place], Annie [place] L, RPH-CPP   1 month ago Type 2 diabetes mellitus with hyperglycemia, with long-term current use of insulin [facility])   [place], RPH-CPP   2 months ago Hospital discharge follow-up   [place], [place] W, NP   10 months ago Type 2 diabetes mellitus with hyperglycemia, with long-term current use of insulin [facility])   [place] Karle Plumber B, MD   11 months ago Migraine without aura and with status migrainosus, not intractable   [place], Deborah B, MD       Future Appointments             In 1 week Daisy Blossom, Jarome Matin, [place]   In 3 weeks Ladell Pier, MD [place]

## 2021-04-05 ENCOUNTER — Other Ambulatory Visit: Payer: Self-pay

## 2021-04-11 ENCOUNTER — Other Ambulatory Visit: Payer: Self-pay

## 2021-04-11 MED FILL — Duloxetine HCl Enteric Coated Pellets Cap 60 MG (Base Eq): ORAL | 30 days supply | Qty: 30 | Fill #0 | Status: AC

## 2021-04-12 ENCOUNTER — Other Ambulatory Visit: Payer: Self-pay

## 2021-04-13 ENCOUNTER — Other Ambulatory Visit: Payer: Self-pay

## 2021-04-13 MED FILL — Insulin Aspart Soln Pen-injector 100 Unit/ML: SUBCUTANEOUS | 26 days supply | Qty: 15 | Fill #0 | Status: AC

## 2021-04-16 ENCOUNTER — Ambulatory Visit: Payer: Medicaid Other | Admitting: Pharmacist

## 2021-04-17 MED FILL — Quetiapine Fumarate Tab 50 MG: ORAL | 30 days supply | Qty: 30 | Fill #0 | Status: AC

## 2021-04-17 MED FILL — Nortriptyline HCl Cap 50 MG: ORAL | 30 days supply | Qty: 30 | Fill #0 | Status: AC

## 2021-04-18 ENCOUNTER — Other Ambulatory Visit: Payer: Self-pay

## 2021-04-19 ENCOUNTER — Other Ambulatory Visit: Payer: Self-pay

## 2021-04-19 MED FILL — Atorvastatin Calcium Tab 20 MG (Base Equivalent): ORAL | 30 days supply | Qty: 30 | Fill #0 | Status: AC

## 2021-04-25 MED FILL — Quetiapine Fumarate Tab 50 MG: ORAL | 30 days supply | Qty: 30 | Fill #1 | Status: AC

## 2021-04-25 MED FILL — Hydroxyzine HCl Tab 25 MG: ORAL | 30 days supply | Qty: 90 | Fill #0 | Status: AC

## 2021-04-26 ENCOUNTER — Other Ambulatory Visit: Payer: Self-pay

## 2021-04-26 MED FILL — Dulaglutide Soln Auto-injector 1.5 MG/0.5ML: SUBCUTANEOUS | 28 days supply | Qty: 2 | Fill #1 | Status: AC

## 2021-04-27 ENCOUNTER — Other Ambulatory Visit: Payer: Self-pay

## 2021-04-30 ENCOUNTER — Other Ambulatory Visit: Payer: Self-pay

## 2021-04-30 ENCOUNTER — Encounter: Payer: Self-pay | Admitting: Internal Medicine

## 2021-04-30 ENCOUNTER — Ambulatory Visit: Payer: Self-pay | Attending: Internal Medicine | Admitting: Internal Medicine

## 2021-04-30 VITALS — BP 124/84 | HR 111 | Ht 63.0 in | Wt 218.0 lb

## 2021-04-30 DIAGNOSIS — Z6839 Body mass index (BMI) 39.0-39.9, adult: Secondary | ICD-10-CM | POA: Insufficient documentation

## 2021-04-30 DIAGNOSIS — R000 Tachycardia, unspecified: Secondary | ICD-10-CM

## 2021-04-30 DIAGNOSIS — Z794 Long term (current) use of insulin: Secondary | ICD-10-CM

## 2021-04-30 DIAGNOSIS — Z800 Family history of malignant neoplasm of digestive organs: Secondary | ICD-10-CM | POA: Insufficient documentation

## 2021-04-30 DIAGNOSIS — Z7984 Long term (current) use of oral hypoglycemic drugs: Secondary | ICD-10-CM | POA: Insufficient documentation

## 2021-04-30 DIAGNOSIS — E669 Obesity, unspecified: Secondary | ICD-10-CM

## 2021-04-30 DIAGNOSIS — Z90710 Acquired absence of both cervix and uterus: Secondary | ICD-10-CM | POA: Insufficient documentation

## 2021-04-30 DIAGNOSIS — Z885 Allergy status to narcotic agent status: Secondary | ICD-10-CM | POA: Insufficient documentation

## 2021-04-30 DIAGNOSIS — Z1211 Encounter for screening for malignant neoplasm of colon: Secondary | ICD-10-CM

## 2021-04-30 DIAGNOSIS — Z886 Allergy status to analgesic agent status: Secondary | ICD-10-CM | POA: Insufficient documentation

## 2021-04-30 DIAGNOSIS — E1169 Type 2 diabetes mellitus with other specified complication: Secondary | ICD-10-CM

## 2021-04-30 DIAGNOSIS — E559 Vitamin D deficiency, unspecified: Secondary | ICD-10-CM | POA: Insufficient documentation

## 2021-04-30 DIAGNOSIS — Z5941 Food insecurity: Secondary | ICD-10-CM | POA: Insufficient documentation

## 2021-04-30 DIAGNOSIS — E1165 Type 2 diabetes mellitus with hyperglycemia: Secondary | ICD-10-CM | POA: Insufficient documentation

## 2021-04-30 DIAGNOSIS — Z79899 Other long term (current) drug therapy: Secondary | ICD-10-CM | POA: Insufficient documentation

## 2021-04-30 DIAGNOSIS — Z1231 Encounter for screening mammogram for malignant neoplasm of breast: Secondary | ICD-10-CM

## 2021-04-30 DIAGNOSIS — E785 Hyperlipidemia, unspecified: Secondary | ICD-10-CM | POA: Insufficient documentation

## 2021-04-30 DIAGNOSIS — F331 Major depressive disorder, recurrent, moderate: Secondary | ICD-10-CM

## 2021-04-30 DIAGNOSIS — N951 Menopausal and female climacteric states: Secondary | ICD-10-CM

## 2021-04-30 DIAGNOSIS — Z87891 Personal history of nicotine dependence: Secondary | ICD-10-CM | POA: Insufficient documentation

## 2021-04-30 LAB — POCT GLYCOSYLATED HEMOGLOBIN (HGB A1C): HbA1c, POC (controlled diabetic range): 12.4 % — AB (ref 0.0–7.0)

## 2021-04-30 LAB — GLUCOSE, POCT (MANUAL RESULT ENTRY): POC Glucose: 289 mg/dl — AB (ref 70–99)

## 2021-04-30 MED ORDER — NOVOLOG FLEXPEN 100 UNIT/ML ~~LOC~~ SOPN
PEN_INJECTOR | SUBCUTANEOUS | 0 refills | Status: DC
  Filled 2021-04-30: qty 15, fill #0
  Filled 2021-06-06: qty 15, 23d supply, fill #0

## 2021-04-30 MED ORDER — PREMARIN 0.625 MG/GM VA CREA
TOPICAL_CREAM | VAGINAL | 1 refills | Status: DC
  Filled 2021-04-30: qty 30, 30d supply, fill #0
  Filled 2021-11-30: qty 30, 30d supply, fill #1
  Filled 2021-12-10: qty 30, 90d supply, fill #1

## 2021-04-30 MED ORDER — BASAGLAR KWIKPEN 100 UNIT/ML ~~LOC~~ SOPN
62.0000 [IU] | PEN_INJECTOR | Freq: Every day | SUBCUTANEOUS | 4 refills | Status: DC
  Filled 2021-04-30 – 2021-05-25 (×2): qty 15, 24d supply, fill #0
  Filled 2021-06-20: qty 15, 24d supply, fill #1
  Filled 2021-07-12: qty 15, 24d supply, fill #2
  Filled 2021-08-08: qty 18, 29d supply, fill #3

## 2021-04-30 NOTE — Patient Instructions (Addendum)
Increase Lantus insulin to 62 units at bedtime. Increase NovoLog insulin to 23/15/23 units. Follow-up with the clinical pharmacist in 1 month.  Please bring your blood sugar readings with you.  Please apply for the orange card/cone discount card as discussed today.

## 2021-04-30 NOTE — Progress Notes (Signed)
Patient ID: Whitney Benton, female    DOB: 06-Sep-1971  MRN: [id number]  CC: Diabetes   Subjective: Whitney Benton is a 50 y.o. female who presents for chronic ds management.  She last saw me June 2021. Her concerns today include:  Pt with hx of depression,migraines,mixed incontinence, DM, obesity, HL, vit D def, former smoker  DM/Obesity:  Results for orders placed or performed in visit on 04/30/21  POCT glucose (manual entry)  Result Value Ref Range   POC Glucose 289 (A) 70 - 99 mg/dl  POCT glycosylated hemoglobin (Hb A1C)  Result Value Ref Range   Hemoglobin A1C     HbA1c POC (<> result, manual entry)     HbA1c, POC (prediabetic range)     HbA1c, POC (controlled diabetic range) 12.4 (A) 0.0 - 7.0 %    Since last visit with me she was hospitalized with DKA 01/2021.   -Has seen the clinical pharmacist recently.  A1c 13.1 in March of this year.  Lantus increased to 56 units and NovoLog changed to 20/12/22.  Apparently restarted on Trulicity since last visit with me. -reports compliance and tolerating meds -checks BS TID but no log today.  Gives range 100-280s.  No hypoglycemic episodes recentl   On Seroquel since end March by [facility] provider -she and mom joined gym. Plans to start going 3 x/wk -feels tired and fatigue. Still not sleeping well despite being on her psychiatric medications.  Depression still a major issue.  She reports compliance with taking her psychiatric medications including Seroquel, Cymbalta, hydroxyzine -has appt for eye exam with Dr. Katy Fitch 08/2021.  HL: compliant with tolerating Lipitor Reports checking her blood pressure intermittently and notes that her pulse rate is high.  Feels palpitations at times.  No dizziness.  Tob dep:  Quit smoking 1 yr ago.  C/O hot flashes since hysterectomy in 2006; worse over past 1 yr.  Ovaries remain.Whitney Benton vaginal dryness.  Over due for MMG  HM: at age for colon CA screen.  Could not afford c-scope.  Fhx of colon CA in  her father at age 60.    Patient Active Problem List   Diagnosis Date Noted  . Substance induced mood disorder ([place]) 03/22/2021  . Generalized anxiety disorder 03/22/2021  . 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  . DKA (diabetic ketoacidosis) ([place]) 01/23/2021  . Glaucoma suspect 04/12/2020  . DKA, type 2, not at goal [facility]) 10/02/2019  . AKI (acute kidney injury) ([place]) 10/02/2019  . Hyperkalemia 10/02/2019  . Leukocytosis 10/02/2019  . Abnormal LFTs 10/02/2019  . Stressful life events affecting family and household 08/06/2019  . New onset type 2 diabetes mellitus ([place]) 02/04/2019  . Obesity (BMI 30-39.9) 02/04/2019  . Tobacco dependence 02/04/2019  . Left arm weakness 12/06/2014  . Paresthesias/numbness 12/06/2014  . Tobacco abuse 11/14/2014  . Depression   . Mixed incontinence 06/27/2014  . History of TVH in 2006 for fibroids and menorrhagia; benign pathology 09/08/2011     Current Outpatient Medications on File Prior to Visit  Medication Sig Dispense Refill  . atorvastatin (LIPITOR) 20 MG tablet TAKE 1 TABLET (20 MG TOTAL) BY MOUTH DAILY. 30 tablet 3  . Blood Glucose Monitoring Suppl (TRUE METRIX METER) w/Device KIT Check blood sugars three times a day 1 kit 0  . cyclobenzaprine (FLEXERIL) 10 MG tablet TAKE 1 TABLET (10 MG TOTAL) BY MOUTH DAILY AS NEEDED FOR MUSCLE SPASMS. MUST HAVE OFFICE VISIT FOR REFILLS 30 tablet 0  . Dulaglutide  1.5 MG/0.5ML SOPN INJECT 1.5 MG INTO THE SKIN ONCE A WEEK. 2 mL 2  . DULoxetine (CYMBALTA) 60 MG capsule TAKE 1 CAPSULE (60 MG TOTAL) BY MOUTH DAILY. 30 capsule 2  . glucose blood test strip CHECK BLOOD SUGARS THREE TIMES A DAY (Patient taking differently: CHECK BLOOD SUGARS THREE TIMES A DAY) 100 strip 12  . hydrOXYzine (ATARAX/VISTARIL) 25 MG tablet TAKE 1 TABLET (25 MG TOTAL) BY MOUTH 3 (THREE) TIMES DAILY AS NEEDED. 90 tablet 2  . Insulin Pen Needle (PEN NEEDLES) 31G X 6 MM MISC Use as directed 100 each 0  . Multiple Vitamin  (MULTIVITAMIN ADULT) TABS Take 1 tablet by mouth daily.    . nortriptyline (PAMELOR) 50 MG capsule TAKE 1 CAPSULE (50 MG TOTAL) BY MOUTH AT BEDTIME. 30 capsule 5  . ondansetron (ZOFRAN ODT) 4 MG disintegrating tablet Take 1 tablet (4 mg total) by mouth every 8 (eight) hours as needed for nausea or vomiting. 20 tablet 0  . polyethylene glycol-electrolytes (NULYTELY) 420 g solution USE AS DIRECTED 4000 mL 0  . QUEtiapine (SEROQUEL) 50 MG tablet TAKE 1 TABLET (50 MG TOTAL) BY MOUTH AT BEDTIME. 30 tablet 2  . SUMAtriptan (IMITREX) 100 MG tablet TAKE 1 TABLET EARLIEST ONSET OF MIGRAINE. MAY REPEAT IN 2 HOURS IF HEADACHE PERSISTS OR RECURS. MAXIMUM 2 TABLETS IN 24 HOURS. 10 tablet 5  . topiramate (TOPAMAX) 100 MG tablet Take 1 tablet (100 mg total) by mouth at bedtime. 30 tablet 3  . TRUEPLUS LANCETS 28G MISC Check blood sugars three time a day 100 each 12  . pantoprazole (PROTONIX) 20 MG tablet Take 2 tablets (40 mg total) by mouth daily for 14 days. 28 tablet 0  . [DISCONTINUED] cetirizine (ZYRTEC) 10 MG tablet Take 1 tablet (10 mg total) by mouth daily. (Patient not taking: Reported on 11/02/2019) 30 tablet 1   No current facility-administered medications on file prior to visit.    Allergies  Allergen Reactions  . Aspirin Hives  . Oxycodone Nausea And Vomiting    Social History   Socioeconomic History  . Marital status: Significant Other    Spouse name: Not on file  . Number of children: 3  . Years of education: 98  . Highest education level: Not on file  Occupational History  . Not on file  Tobacco Use  . Smoking status: Former Smoker    Packs/day: 0.25    Years: 8.00    Pack years: 2.00    Types: Cigarettes  . Smokeless tobacco: Never Used  Vaping Use  . Vaping Use: Never used  Substance and Sexual Activity  . Alcohol use: Yes    Comment: occasional  . Drug use: No  . Sexual activity: Yes    Birth control/protection: Surgical  Other Topics Concern  . Not on file  Social  History Narrative   Patient lives at home with mother and father , one story    Patient has 3 children    Patient is single   Patient has 14 years of education    Patient is right handed    Social Determinants of Health   Financial Resource Strain: Not on file  Food Insecurity: Food Insecurity Present  . Worried About Charity fundraiser in the Last Year: Sometimes true  . Ran Out of Food in the Last Year: Often true  Transportation Needs: No Transportation Needs  . Lack of Transportation (Medical): No  . Lack of Transportation (Non-Medical): No  Physical Activity: Not  on file  Stress: Not on file  Social Connections: Not on file  Intimate Partner Violence: Not on file    Family History  Problem Relation Age of Onset  . Diabetes Mother   . Mental illness Mother   . Depression Mother   . Hypertension Mother   . Hypertension Father   . Diabetes Father     Past Surgical History:  Procedure Laterality Date  . CESAREAN SECTION    . VAGINAL HYSTERECTOMY  2006   Fibroids, menorrhagia, benign pathology    ROS: Review of Systems Negative except as stated above  PHYSICAL EXAM: BP 124/84   Pulse (!) 111   Ht 5' 3"  (1.6 m)   Wt 218 lb (98.9 kg)   SpO2 95%   BMI 38.62 kg/m   Wt Readings from Last 3 Encounters:  04/30/21 218 lb (98.9 kg)  03/22/21 213 lb (96.6 kg)  02/08/21 210 lb 3.2 oz (95.3 kg)    Physical Exam  General appearance - alert, well appearing, and in no distress Mental status - normal mood, behavior, speech, dress, motor activity, and thought processes Mouth - mucous membranes moist, pharynx normal without lesions Neck - supple, no significant adenopathy Chest - clear to auscultation, no wheezes, rales or rhonchi, symmetric air entry Heart -mild tachycardia but sounds like sinus.  No gallops or murmurs. Extremities -no lower extremity edema.   Diabetic Foot Exam - Simple   Simple Foot Form Visual Inspection No deformities, no ulcerations, no other  skin breakdown bilaterally: Yes Sensation Testing Intact to touch and monofilament testing bilaterally: Yes Pulse Check Posterior Tibialis and Dorsalis pulse intact bilaterally: Yes Comments    EKG: Sinus tachycardia with a rate of 106.  Q waves in V1 V2 unchanged from previous EKG.  Depression screen [facility] 2/9 04/30/2021 03/22/2021 02/05/2021  Decreased Interest 2 3 3   Down, Depressed, Hopeless 2 3 3   PHQ - 2 Score 4 6 6   Altered sleeping 3 3 3   Tired, decreased energy 3 3 3   Change in appetite 3 3 3   Feeling bad or failure about yourself  3 2 3   Trouble concentrating 3 3 3   Moving slowly or fidgety/restless 0 3 3  Suicidal thoughts 0 0 0  PHQ-9 Score 19 23 24   Difficult doing work/chores - Somewhat difficult -  Some recent data might be hidden   GAD 7 : Generalized Anxiety Score 04/30/2021 03/22/2021 01/30/2021 10/06/2020  Nervous, Anxious, on Edge 3 3 3 3   Control/stop worrying 3 3 3 3   Worry too much - different things 3 3 3 3   Trouble relaxing 3 3 3 3   Restless 3 3 3 3   Easily annoyed or irritable 3 3 3 3   Afraid - awful might happen 3 2 2 3   Total GAD 7 Score 21 20 20 21   Anxiety Difficulty - Extremely difficult - -     CMP Latest Ref Rng & Units 01/26/2021 01/26/2021 01/26/2021  Glucose 70 - 99 mg/dL 236(H) 133(H) 115(H)  BUN 6 - 20 mg/dL <5(L) <5(L) <5(L)  Creatinine 0.44 - 1.00 mg/dL 0.69 0.63 0.63  Sodium 135 - 145 mmol/L 137 140 141  Potassium 3.5 - 5.1 mmol/L 3.3(L) 3.1(L) 2.7(LL)  Chloride 98 - 111 mmol/L 103 108 107  CO2 22 - 32 mmol/L 23 24 25   Calcium 8.9 - 10.3 mg/dL 8.7(L) 8.3(L) 8.5(L)  Total Protein 6.5 - 8.1 g/dL - - -  Total Bilirubin 0.3 - 1.2 mg/dL - - -  Alkaline  Phos 38 - 126 U/L - - -  AST 15 - 41 U/L - - -  ALT 0 - 44 U/L - - -   Lipid Panel     Component Value Date/Time   CHOL 159 02/04/2020 0955   TRIG 117 02/04/2020 0955   HDL 46 02/04/2020 0955   CHOLHDL 3.5 02/04/2020 0955   CHOLHDL 3.2 11/14/2014 0707   VLDL 19 11/14/2014 0707   LDLCALC  92 02/04/2020 0955    CBC    Component Value Date/Time   WBC 9.9 01/25/2021 0334   RBC 4.26 01/25/2021 0334   HGB 12.5 01/25/2021 0334   HGB 13.6 07/14/2019 1058   HCT 36.2 01/25/2021 0334   HCT 40.5 07/14/2019 1058   PLT 303 01/25/2021 0334   PLT 343 07/14/2019 1058   MCV 85.0 01/25/2021 0334   MCV 85 07/14/2019 1058   MCH 29.3 01/25/2021 0334   MCHC 34.5 01/25/2021 0334   RDW 14.0 01/25/2021 0334   RDW 13.1 07/14/2019 1058   LYMPHSABS 1.5 01/23/2021 1052   LYMPHSABS 2.4 07/14/2019 1058   MONOABS 0.7 01/23/2021 1052   EOSABS 0.0 01/23/2021 1052   EOSABS 0.1 07/14/2019 1058   BASOSABS 0.0 01/23/2021 1052   BASOSABS 0.0 07/14/2019 1058    ASSESSMENT AND PLAN: 1. Type 2 diabetes mellitus with hyperglycemia, with long-term current use of insulin (HCC) A1c minimally improved. Recommend increasing Lantus to 62 units and NovoLog to 23/15/23 with meals. Discussed and encourage healthy eating habits. -Encouraged her to get moving now that she has the gym membership - POCT glucose (manual entry) - POCT glycosylated hemoglobin (Hb A1C) - Microalbumin / creatinine urine ratio - CBC; Future - insulin aspart (NOVOLOG FLEXPEN) 100 UNIT/ML FlexPen; INJECT 25 UNITS BEFORE BREAKFAST, 15 UNITS BEFORE LUNCH, AND 25 UNITS BEFORE DINNER.  Dispense: 15 mL; Refill: 0 - Insulin Glargine (BASAGLAR KWIKPEN) 100 UNIT/ML; Inject 62 Units into the skin at bedtime.  Dispense: 15 mL; Refill: 4 - Basic Metabolic Panel  2. Hyperlipidemia associated with type 2 diabetes mellitus ([place]) Continue atorvastatin  3. Obesity (BMI 35.0-39.9 without comorbidity) See #1 above  4. Major depressive disorder, recurrent episode, moderate ([place]) Patient plugged into behavioral health services.  She will continue follow-up with them.  No suicidal ideation at this time.  5. Tachycardia Check TSH and chemistry.  If both are normal, will refer to cardiology - TSH+T4F+T3Free - EKG 12-Lead  6. Hot flash,  menopausal 7. Perimenopausal Patient with history of hysterectomy but ovaries left in place.  Now having perimenopausal symptoms that are bothersome to her.  We discussed medications that can help decrease the hot flashes but not necessarily decrease the vaginal dryness.  I am a bit reluctant to use oral HRT given her chronic medical diseases but I think using Premarin vaginal cream would be a good option.  Discussed the importance of getting up-to-date with mammogram screening. - conjugated estrogens (PREMARIN) vaginal cream; Apply 0.5 gram intravaginally 2 times a week  Dispense: 42.5 g; Refill: 1  8. Screening for colon cancer Ideally I would like for her to have a colonoscopy given that her father had colon cancer but she is uninsured and cannot afford.  We will have her do the fit test for now instead.  Given forms to apply for the orange card/cone discount card. - Fecal occult blood, imunochemical([facility]/Sunquest)  9. Encounter for screening mammogram for malignant neoplasm of breast - MM Digital Screening; Future  10. Former smoker Commended her on quitting.  Encouraged her to remain tobacco free.     Patient was given the opportunity to ask questions.  Patient verbalized understanding of the plan and was able to repeat key elements of the plan.   Orders Placed This Encounter  Procedures  . Fecal occult blood, imunochemical([facility]corp/Sunquest)  . MM Digital Screening  . Microalbumin / creatinine urine ratio  . CBC  . TSH+T4F+T3Free  . Basic Metabolic Panel  . POCT glucose (manual entry)  . POCT glycosylated hemoglobin (Hb A1C)  . EKG 12-Lead     Requested Prescriptions   Signed Prescriptions Disp Refills  . insulin aspart (NOVOLOG FLEXPEN) 100 UNIT/ML FlexPen 15 mL 0    Sig: INJECT 25 UNITS BEFORE BREAKFAST, 15 UNITS BEFORE LUNCH, AND 25 UNITS BEFORE DINNER.  . Insulin Glargine (BASAGLAR KWIKPEN) 100 UNIT/ML 15 mL 4    Sig: Inject 62 Units into the skin at bedtime.  .  conjugated estrogens (PREMARIN) vaginal cream 42.5 g 1    Sig: Apply 0.5 gram intravaginally 2 times a week    Return in about 3 months (around 07/31/2021) for Give appt with Lurena Joiner in 1 mth for DM check.  Karle Plumber, MD, FACP

## 2021-05-01 ENCOUNTER — Other Ambulatory Visit: Payer: Self-pay | Admitting: Internal Medicine

## 2021-05-01 ENCOUNTER — Telehealth: Payer: Self-pay

## 2021-05-01 ENCOUNTER — Other Ambulatory Visit: Payer: Self-pay

## 2021-05-01 DIAGNOSIS — R000 Tachycardia, unspecified: Secondary | ICD-10-CM

## 2021-05-01 LAB — TSH+T4F+T3FREE
Free T4: 0.92 ng/dL (ref 0.82–1.77)
T3, Free: 2.6 pg/mL (ref 2.0–4.4)
TSH: 2.46 u[IU]/mL (ref 0.450–4.500)

## 2021-05-01 LAB — BASIC METABOLIC PANEL
BUN/Creatinine Ratio: 12 (ref 9–23)
BUN: 10 mg/dL (ref 6–24)
CO2: 25 mmol/L (ref 20–29)
Calcium: 9.3 mg/dL (ref 8.7–10.2)
Chloride: 100 mmol/L (ref 96–106)
Creatinine, Ser: 0.85 mg/dL (ref 0.57–1.00)
Glucose: 186 mg/dL — ABNORMAL HIGH (ref 65–99)
Potassium: 3.9 mmol/L (ref 3.5–5.2)
Sodium: 142 mmol/L (ref 134–144)
eGFR: 83 mL/min/{1.73_m2} (ref >59)

## 2021-05-01 LAB — MICROALBUMIN / CREATININE URINE RATIO
Creatinine, Urine: 116.8 mg/dL
Microalb/Creat Ratio: 6 mg/g creat (ref 0–29)
Microalbumin, Urine: 6.5 ug/mL

## 2021-05-01 NOTE — Telephone Encounter (Signed)
Contacted pt to go over lab results pt didn't answer lvm  

## 2021-05-01 NOTE — Progress Notes (Signed)
Let patient know that her kidney function and thyroid levels are normal.  I have submitted a referral for her to see the cardiologist.

## 2021-05-10 ENCOUNTER — Other Ambulatory Visit: Payer: Self-pay

## 2021-05-10 MED FILL — Nortriptyline HCl Cap 50 MG: ORAL | 30 days supply | Qty: 30 | Fill #1 | Status: AC

## 2021-05-10 MED FILL — Duloxetine HCl Enteric Coated Pellets Cap 60 MG (Base Eq): ORAL | 30 days supply | Qty: 30 | Fill #1 | Status: AC

## 2021-05-10 MED FILL — Atorvastatin Calcium Tab 20 MG (Base Equivalent): ORAL | 30 days supply | Qty: 30 | Fill #1 | Status: AC

## 2021-05-11 NOTE — Telephone Encounter (Signed)
Pt. Given results, verbalizes understanding. 

## 2021-05-15 ENCOUNTER — Other Ambulatory Visit: Payer: Self-pay

## 2021-05-23 ENCOUNTER — Other Ambulatory Visit: Payer: Self-pay

## 2021-05-23 ENCOUNTER — Ambulatory Visit (HOSPITAL_COMMUNITY): Payer: No Payment, Other | Admitting: Licensed Clinical Social Worker

## 2021-05-23 ENCOUNTER — Encounter (HOSPITAL_COMMUNITY): Payer: Self-pay

## 2021-05-25 ENCOUNTER — Other Ambulatory Visit: Payer: Self-pay

## 2021-05-25 ENCOUNTER — Ambulatory Visit (HOSPITAL_COMMUNITY): Payer: No Payment, Other | Admitting: Psychiatry

## 2021-05-25 ENCOUNTER — Other Ambulatory Visit: Payer: Self-pay | Admitting: Internal Medicine

## 2021-05-25 MED ORDER — TRULICITY 1.5 MG/0.5ML ~~LOC~~ SOAJ
1.5000 mg | SUBCUTANEOUS | 2 refills | Status: DC
  Filled 2021-05-25: qty 2, 28d supply, fill #0

## 2021-05-25 NOTE — Telephone Encounter (Signed)
Requested medication (s) are due for refill today: Yes  Requested medication (s) are on the active medication list: Yes  Last refill:  2 months ago  Future visit scheduled: Yes  Notes to clinic:  See highlighted from Pharmacy     Requested Prescriptions  Pending Prescriptions Disp Refills   Dulaglutide (TRULICITY) 1.5 LY/6.5KP SOPN 2 mL 2    Sig: INJECT 1.5 MG INTO THE SKIN ONCE A WEEK.      Endocrinology:  Diabetes - GLP-1 Receptor Agonists Failed - 05/25/2021 12:16 PM      Failed - HBA1C is between 0 and 7.9 and within 180 days    HbA1c, POC (controlled diabetic range)  Date Value Ref Range Status  04/30/2021 12.4 (A) 0.0 - 7.0 % Final          Passed - Valid encounter within last 6 months    Recent Outpatient Visits           3 weeks ago Type 2 diabetes mellitus with hyperglycemia, with long-term current use of insulin ([place])   [place], Neoma Laming B, MD   1 month ago Type 2 diabetes mellitus with hyperglycemia, with long-term current use of insulin [facility])   [place], Annie Main L, RPH-CPP   2 months ago Type 2 diabetes mellitus with hyperglycemia, with long-term current use of insulin [facility])   [place], Stephen L, RPH-CPP   3 months ago Hospital discharge follow-up   [place], Vernia Buff, NP   11 months ago Type 2 diabetes mellitus with hyperglycemia, with long-term current use of insulin [facility])   [place], MD       Future Appointments             In 6 days Daisy Blossom, Jarome Matin, [place]   In 1 month Johney Frame, Greer Ee, MD [place], [facility]   In 2 months Wynetta Emery, Dalbert Batman, MD [place]

## 2021-05-28 ENCOUNTER — Other Ambulatory Visit: Payer: Self-pay

## 2021-05-29 ENCOUNTER — Other Ambulatory Visit: Payer: Self-pay

## 2021-05-31 ENCOUNTER — Ambulatory Visit: Payer: Medicaid Other | Admitting: Pharmacist

## 2021-05-31 ENCOUNTER — Other Ambulatory Visit: Payer: Self-pay

## 2021-05-31 MED FILL — Sumatriptan Succinate Tab 100 MG: ORAL | 30 days supply | Qty: 9 | Fill #0 | Status: AC

## 2021-06-06 ENCOUNTER — Other Ambulatory Visit: Payer: Self-pay

## 2021-06-06 ENCOUNTER — Telehealth: Payer: Self-pay

## 2021-06-06 ENCOUNTER — Ambulatory Visit (INDEPENDENT_AMBULATORY_CARE_PROVIDER_SITE_OTHER): Payer: No Payment, Other | Admitting: Licensed Clinical Social Worker

## 2021-06-06 DIAGNOSIS — F331 Major depressive disorder, recurrent, moderate: Secondary | ICD-10-CM | POA: Diagnosis not present

## 2021-06-06 NOTE — Telephone Encounter (Signed)
Contacting patient to notify them to bring housing documentation to tomorrows visit to assist with Legal Aid. LVM for the patient.

## 2021-06-06 NOTE — Telephone Encounter (Signed)
Patient came into the clinic inquiring about resources for rent and utility. Patent shared they are in the eviction process and need assistance. Case Manager shared a list of community resources:   [place] - (heating/cooling and water assistance) [place] (utility assistance: water, power and gas) [employer] residents only - 3311026321 [employer] (rental/utility assistance) [phone] [employer] [address] [employer] ([place] program) - [place] [facility]) (housing expenses/utilities) [phone] Legal Aid  [phone]  Patient has and in person visit tomorrow with a provider, can sign Legal Aid Referral then.

## 2021-06-07 ENCOUNTER — Telehealth: Payer: Self-pay | Admitting: Internal Medicine

## 2021-06-07 ENCOUNTER — Ambulatory Visit: Payer: Self-pay | Attending: Internal Medicine | Admitting: Pharmacist

## 2021-06-07 ENCOUNTER — Other Ambulatory Visit: Payer: Self-pay

## 2021-06-07 ENCOUNTER — Telehealth: Payer: Self-pay

## 2021-06-07 DIAGNOSIS — Z794 Long term (current) use of insulin: Secondary | ICD-10-CM

## 2021-06-07 DIAGNOSIS — E1165 Type 2 diabetes mellitus with hyperglycemia: Secondary | ICD-10-CM

## 2021-06-07 MED ORDER — TRULICITY 3 MG/0.5ML ~~LOC~~ SOAJ
3.0000 mg | SUBCUTANEOUS | 2 refills | Status: DC
  Filled 2021-06-07: qty 2, 28d supply, fill #0
  Filled 2021-07-12: qty 2, 28d supply, fill #1
  Filled 2021-08-08: qty 2, 28d supply, fill #2

## 2021-06-07 NOTE — Telephone Encounter (Signed)
Emailed referral form sent to Legal Aid on the patients behalf.

## 2021-06-07 NOTE — Telephone Encounter (Signed)
Whitney Benton while checking out of her appt w/ Lurena Joiner asked for a referral to be sent to a Diabetic/ Nutritionist for educational  purposes Thank you

## 2021-06-07 NOTE — Telephone Encounter (Signed)
Will forward to provider  

## 2021-06-08 ENCOUNTER — Encounter: Payer: Self-pay | Admitting: Pharmacist

## 2021-06-08 LAB — GLUCOSE, POCT (MANUAL RESULT ENTRY): POC Glucose: 334 mg/dl — AB (ref 70–99)

## 2021-06-08 NOTE — Progress Notes (Signed)
    S:    PCP: Dr. Wynetta Emery   No chief complaint on file.  Patient arrives in good spirits.  Presents for diabetes evaluation, education, and management. Patient was referred and last seen by Dr. Wynetta Emery on 04/30/2021. Her insulin was adjusted at that visit.   Family History: Diabetes (mother and father), HTN (mother and father) Smoking: former smoker 0.25 PPD for 8 years Alcohol: occasional use  Insurance coverage/medication affordability: none  Medication adherence reported.   Current diabetes medications include: Novolog 25-15-25 TID before meals (pt takes after her meals), lantus 62 units every morning, Trulicity 1.5 mg weekly  Patient denies hypoglycemic events.  Patient reported dietary habits:  - Pt endorses trying to limit carbs (potatoes, french fries) - Uses air fryer for most meals to limit fats  Patient-reported exercise habits: limited exercise at the moment due to taking care of her mother   Patient denies nocturia (nighttime urination).  Patient denies neuropathy (nerve pain). Patient denies visual changes. Patient denies self foot exams.    O:  POCT glucose: 334  Lab Results  Component Value Date   HGBA1C 12.4 (A) 04/30/2021   There were no vitals filed for this visit.  Lipid Panel     Component Value Date/Time   CHOL 159 02/04/2020 0955   TRIG 117 02/04/2020 0955   HDL 46 02/04/2020 0955   CHOLHDL 3.5 02/04/2020 0955   CHOLHDL 3.2 11/14/2014 0707   VLDL 19 11/14/2014 0707   LDLCALC 92 02/04/2020 0955   Home fasting blood sugars: low 200s  2 hour post-meal/random blood sugars: 150s  These are pt reported values since starting Trulicity. Pt does not have meter with her today.  Clinical Atherosclerotic Cardiovascular Disease (ASCVD): No   The 10-year ASCVD risk score Mikey Bussing DC Jr., et al., 2013) is: 8.1%   Values used to calculate the score:     Age: 50 years     Sex: Female     Is Non-Hispanic African American: Yes     Diabetic: Yes     Tobacco  smoker: Yes     Systolic Blood Pressure: 607 mmHg     Is BP treated: No     HDL Cholesterol: 46 mg/dL     Total Cholesterol: 159 mg/dL    A/P: Diabetes longstanding currently uncontrolled. Medication optimization will be needed to further reduce A1C and improve diabetes control. Patient is able to verbalize appropriate hypoglycemia management plan. Medication adherence reported. Her AM blood sugar levels between breakfast and lunch are low. Will reduce Novolog to 37-10-62. -Increase Trulicity to 3 mg weekly.  -Continued Lantus 62 units QAM  - Continued Novolog 25-15-25 TID. Advised patient to take before meals.  -Extensively discussed pathophysiology of diabetes, recommended lifestyle interventions, dietary effects on blood sugar control -Counseled on s/sx of and management of hypoglycemia -Next A1C anticipated 07/2021.   Written patient instructions provided.  Total time in face to face counseling 15 minutes.   Follow up Pharmacist Clinic Visit in 4 weeks.     Benard Halsted, PharmD, [place], [place] [phone]

## 2021-06-11 NOTE — Progress Notes (Signed)
   THERAPIST PROGRESS NOTE  Session Time: 55 min  Participation Level: Active  Behavioral Response: CasualAlertAnxious and Depressed  Type of Therapy: Individual Therapy  Treatment Goals addressed: Communication: Dep/anx/coping  Interventions: Supportive and Other: grief counseling/education  Summary: Whitney Benton is a 50 y.o. female who presents with hx of MDD. This date pt returns for in person session. This is first session since initial session 03/30/21. Pt had no show appt 06/01, which was addressed. Pt states she came but thought appt was 8:30 so she did not arrive until that time. Pt reports she is overall feeling anxious, stressed and continues to have some symptoms of depression. Pt states she is at risk of losing her housing. She provides many details of landlord situation. She is currently appealing the judges ruling in landlord's favor to evict for nonpayment. Pt's relationship with son in the home remains very strained. She is caring for 2 24 mon old puppies, one of which is sons. Pt continues to help her mother as needed with care. She states her sister has returned to her mother's home and is creating difficulty. Her sister's mil is reportedly trying to take sister's children. Pt is not sleeping and diabetes is poorly controled. Pt has not checked her BS, taken any meds or eaten any food today. Pt reports she got up late and wanted to make sure she was not late for counseling session. LCSW provided education on importance of DM management. Pt reportedly had a referral to work with a diabetes educator but did not follow through, states she did not hear from them. Encouraged her to f/u and make appt. Pt has not had med management appt with Dr. Ronne Binning, canceled scheduled appt. Strongly encouraged her to keep upcoming appt. Remainder of session spent addressing grief with grief counseling and education. Discussed loss of father and pending father's day. Provided grief lit for pt to take  with her. Reviewed poc including scheduling. Pt states appreciation for care.    Suicidal/Homicidal: Nowithout intent/plan  Therapist Response: Pt receptive to care.  Plan: Return again for next avail appt.  Diagnosis: Axis I:  MDD, moderate  Hermine Messick, LCSW 06/11/2021

## 2021-06-13 ENCOUNTER — Other Ambulatory Visit: Payer: Self-pay

## 2021-06-14 ENCOUNTER — Other Ambulatory Visit: Payer: Self-pay

## 2021-06-14 ENCOUNTER — Ambulatory Visit (INDEPENDENT_AMBULATORY_CARE_PROVIDER_SITE_OTHER): Payer: No Payment, Other | Admitting: Psychiatry

## 2021-06-14 ENCOUNTER — Encounter (HOSPITAL_COMMUNITY): Payer: Self-pay | Admitting: Psychiatry

## 2021-06-14 DIAGNOSIS — F1994 Other psychoactive substance use, unspecified with psychoactive substance-induced mood disorder: Secondary | ICD-10-CM

## 2021-06-14 DIAGNOSIS — F411 Generalized anxiety disorder: Secondary | ICD-10-CM

## 2021-06-14 MED ORDER — DULOXETINE HCL 60 MG PO CPEP
60.0000 mg | ORAL_CAPSULE | Freq: Every day | ORAL | 2 refills | Status: DC
  Filled 2021-06-14: qty 30, 30d supply, fill #0
  Filled 2021-07-12: qty 30, 30d supply, fill #1
  Filled 2021-08-08: qty 30, 30d supply, fill #2

## 2021-06-14 MED ORDER — BUSPIRONE HCL 30 MG PO TABS
30.0000 mg | ORAL_TABLET | Freq: Every day | ORAL | 2 refills | Status: DC
  Filled 2021-06-14: qty 30, 30d supply, fill #0
  Filled 2021-07-12: qty 30, 30d supply, fill #1
  Filled 2021-08-08: qty 30, 30d supply, fill #2
  Filled 2021-09-08: qty 30, 30d supply, fill #3
  Filled 2021-10-12: qty 30, 30d supply, fill #4

## 2021-06-14 MED ORDER — QUETIAPINE FUMARATE 50 MG PO TABS
ORAL_TABLET | Freq: Every day | ORAL | 2 refills | Status: DC
  Filled 2021-06-14: qty 30, 30d supply, fill #0
  Filled 2021-08-08: qty 30, 30d supply, fill #1
  Filled 2021-09-08: qty 30, 30d supply, fill #2

## 2021-06-14 MED ORDER — HYDROXYZINE HCL 25 MG PO TABS
ORAL_TABLET | Freq: Three times a day (TID) | ORAL | 2 refills | Status: DC | PRN
  Filled 2021-06-14: qty 90, 30d supply, fill #0
  Filled 2021-08-08: qty 90, 30d supply, fill #1
  Filled 2021-10-17: qty 90, 30d supply, fill #2

## 2021-06-14 MED ORDER — ZOLPIDEM TARTRATE 5 MG PO TABS: 5.0000 mg | ORAL_TABLET | Freq: Every evening | ORAL | 2 refills | Status: DC | PRN

## 2021-06-14 NOTE — Progress Notes (Signed)
BH MD/PA/NP OP Progress Note  06/14/2021 9:53 AM Whitney Benton  MRN:  [id number]  Chief Complaint: "Things have been bonkers"  HPI: 50 year old female seen today for follow up psychiatric evaluation.  She has a psychiatric history of anxiety and depression.  She is currently managed on Seroquel 50 mg nightly, hydroxyzine 50 mg nightly, hydroxyzine 25 mg 3 times daily, and Cymbalta 60 mg daily.  She notes her medications are somewhat effective in managing her psychiatric condition.  Today patient was pleasant, cooperative, engaged in conversation, and maintained eye contact.  She informed Probation officer that "things have been bonkers".  She notes that her landlord is threatening to put her out because she has been behind on rent.  She notes that she uses social services to help pay her rent but they were late.  She informed Probation officer that she has no money and does not have another place to stay.  She informed Probation officer that she is waiting for her disability to be approved.  Reports her leg feels over $ 900 dollars.  She also notes that her son is a source of her stress noting that he lives with her now.  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  Patient informed writer that the above exacerbates her anxiety and depression.  Today provider conducted a GAD-7 and patient scored 19, at her last visit she scored 20.  Provider also conducted a PHQ-9 and patient scored an 18, at her last visit she scored a 23.  Patient notes that she has migraines daily.  Provider asked patient if she takes nortriptyline she reports that she does.  Provider informed patient that nortriptyline and Cymbalta can cause serotonin syndrome and inform patient to discuss with her neurologist about being on another medications to control her migraine.  She notes that she follows up with her neurologist in August.  Patient notes that her sleep is poor.  She informed Probation officer that she wakes up hourly.   Notes that she has a poor appetite however informed writer that she is maintaining her weight  Patient notes that she continues to grieve the loss of her father 7.  She reports that he looked out for her and protected her.  She notes that now she feels like she has to look up for herself and protect her mother.  She notes that she becomes irritable at times and people try to take advantage of her and her mother.  She is agreeable to starting BuSpar 10 mg 3 times daily to help manage symptoms of anxiety and depression.  She will also start Ambien 5 mg nightly to help manage sleep (Seroquel, nortriptyline, and trazodone has been ineffective) .  Provider informed patient that Ambien will be discontinued within 3 to 6 months.  She endorsed understanding and agreement. Potential side effects of medication and risks vs benefits of treatment vs non-treatment were explained and discussed. All questions were answered.  She will follow up with outpatient counseling for therapy.  No other concerns noted at this time. Visit Diagnosis:    ICD-10-CM   1. Substance induced mood disorder (HCC)  F19.94 DULoxetine (CYMBALTA) 60 MG capsule    QUEtiapine (SEROQUEL) 50 MG tablet    zolpidem (AMBIEN) 5 MG tablet    2. Generalized anxiety disorder  F41.1 DULoxetine (CYMBALTA) 60 MG capsule    hydrOXYzine (ATARAX/VISTARIL) 25 MG tablet    QUEtiapine (SEROQUEL) 50 MG tablet      Past  Psychiatric History: anxiety and depression  Past Medical History:  Past Medical History:  Diagnosis Date   Anxiety    Depression    Diabetes mellitus without complication ([place])    Trichomonas infection     Past Surgical History:  Procedure Laterality Date   CESAREAN SECTION     VAGINAL HYSTERECTOMY  2006   Fibroids, menorrhagia, benign pathology    Family Psychiatric History: Mother schizophrenia and bipolar disorder  Family History:  Family History  Problem Relation Age of Onset   Diabetes Mother    Mental illness  Mother    Depression Mother    Hypertension Mother    Hypertension Father    Diabetes Father     Social History:  Social History   Socioeconomic History   Marital status: Significant Other    Spouse name: Not on file   Number of children: 3   Years of education: 14   Highest education level: Not on file  Occupational History   Not on file  Tobacco Use   Smoking status: Former    Packs/day: 0.25    Years: 8.00    Pack years: 2.00    Types: Cigarettes   Smokeless tobacco: Never  Vaping Use   Vaping Use: Never used  Substance and Sexual Activity   Alcohol use: Yes    Comment: occasional   Drug use: No   Sexual activity: Yes    Birth control/protection: Surgical  Other Topics Concern   Not on file  Social History Narrative   Patient lives at home with mother and father , one story    Patient has 3 children    Patient is single   Patient has 14 years of education    Patient is right handed    Social Determinants of Radio broadcast assistant Strain: Not on file  Food Insecurity: Food Insecurity Present   Worried About Charity fundraiser in the Last Year: Sometimes true   Arboriculturist in the Last Year: Often true  Transportation Needs: No Transportation Needs   Lack of Transportation (Medical): No   Lack of Transportation (Non-Medical): No  Physical Activity: Not on file  Stress: Not on file  Social Connections: Not on file    Allergies:  Allergies  Allergen Reactions   Aspirin Hives   Oxycodone Nausea And Vomiting    Metabolic Disorder Labs: Lab Results  Component Value Date   HGBA1C 12.4 (A) 04/30/2021   MPG 380.93 10/02/2019   MPG 289.09 01/06/2019   No results found for: PROLACTIN Lab Results  Component Value Date   CHOL 159 02/04/2020   TRIG 117 02/04/2020   HDL 46 02/04/2020   CHOLHDL 3.5 02/04/2020   VLDL 19 11/14/2014   LDLCALC 92 02/04/2020   LDLCALC 124 (H) 02/04/2019   Lab Results  Component Value Date   TSH 2.460 04/30/2021    TSH 0.014 (L) 10/02/2019    Therapeutic Level Labs: No results found for: LITHIUM No results found for: VALPROATE No components found for:  CBMZ  Current Medications: Current Outpatient Medications  Medication Sig Dispense Refill   busPIRone (BUSPAR) 30 MG tablet Take 1 tablet (30 mg total) by mouth daily. 90 tablet 2   insulin aspart (NOVOLOG FLEXPEN) 100 UNIT/ML FlexPen INJECT 25 UNITS BEFORE BREAKFAST, 15 UNITS BEFORE LUNCH, AND 25 UNITS BEFORE DINNER. 15 mL 0   Insulin Glargine (BASAGLAR KWIKPEN) 100 UNIT/ML Inject 62 Units into the skin at bedtime. 15 mL 4  Insulin Pen Needle (PEN NEEDLES) 31G X 6 MM MISC Use as directed 100 each 0   Multiple Vitamin (MULTIVITAMIN ADULT) TABS Take 1 tablet by mouth daily.     nortriptyline (PAMELOR) 50 MG capsule TAKE 1 CAPSULE (50 MG TOTAL) BY MOUTH AT BEDTIME. 30 capsule 5   ondansetron (ZOFRAN ODT) 4 MG disintegrating tablet Take 1 tablet (4 mg total) by mouth every 8 (eight) hours as needed for nausea or vomiting. 20 tablet 0   polyethylene glycol-electrolytes (NULYTELY) 420 g solution USE AS DIRECTED 4000 mL 0   SUMAtriptan (IMITREX) 100 MG tablet TAKE 1 TABLET EARLIEST ONSET OF MIGRAINE. MAY REPEAT IN 2 HOURS IF HEADACHE PERSISTS OR RECURS. MAXIMUM 2 TABLETS IN 24 HOURS. 10 tablet 5   topiramate (TOPAMAX) 100 MG tablet Take 1 tablet (100 mg total) by mouth at bedtime. 30 tablet 3   TRUEPLUS LANCETS 28G MISC Check blood sugars three time a day 100 each 12   zolpidem (AMBIEN) 5 MG tablet Take 1 tablet (5 mg total) by mouth at bedtime as needed for sleep. 30 tablet 2   atorvastatin (LIPITOR) 20 MG tablet TAKE 1 TABLET (20 MG TOTAL) BY MOUTH DAILY. 30 tablet 3   Blood Glucose Monitoring Suppl (TRUE METRIX METER) w/Device KIT Check blood sugars three times a day 1 kit 0   conjugated estrogens (PREMARIN) vaginal cream Apply 0.5 gram intravaginally 2 times a week 42.5 g 1   cyclobenzaprine (FLEXERIL) 10 MG tablet TAKE 1 TABLET (10 MG TOTAL) BY MOUTH  DAILY AS NEEDED FOR MUSCLE SPASMS. MUST HAVE OFFICE VISIT FOR REFILLS 30 tablet 0   Dulaglutide (TRULICITY) 3 HE/1.7EY SOPN Inject 3 mg as directed once a week. 2 mL 2   DULoxetine (CYMBALTA) 60 MG capsule Take 1 capsule (60 mg total) by mouth daily. 30 capsule 2   glucose blood test strip CHECK BLOOD SUGARS THREE TIMES A DAY (Patient taking differently: CHECK BLOOD SUGARS THREE TIMES A DAY) 100 strip 12   hydrOXYzine (ATARAX/VISTARIL) 25 MG tablet TAKE 1 TABLET (25 MG TOTAL) BY MOUTH 3 (THREE) TIMES DAILY AS NEEDED. 90 tablet 2   pantoprazole (PROTONIX) 20 MG tablet Take 2 tablets (40 mg total) by mouth daily for 14 days. 28 tablet 0   QUEtiapine (SEROQUEL) 50 MG tablet TAKE 1 TABLET (50 MG TOTAL) BY MOUTH AT BEDTIME. 30 tablet 2   No current facility-administered medications for this visit.     Musculoskeletal: Strength & Muscle Tone: within normal limits Gait & Station: normal Patient leans: N/A  Psychiatric Specialty Exam: Review of Systems  Blood pressure 107/88, pulse (!) 107, height _0  (1.6 m), weight 217 lb (98.4 kg).Body mass index is 38.44 kg/m.  General Appearance: Well Groomed  Eye Contact:  Good  Speech:  Clear and Coherent  Volume:  Normal  Mood:  Anxious and Depressed  Affect:  Appropriate and Congruent  Thought Process:  Coherent, Goal Directed, and Linear  Orientation:  Full (Time, Place, and Person)  Thought Content: WDL and Logical   Suicidal Thoughts:  No  Homicidal Thoughts:  No  Memory:  Immediate;   Good Recent;   Good Remote;   Good  Judgement:  Good  Insight:  Good  Psychomotor Activity:  Normal  Concentration:  Concentration: Good and Attention Span: Good  Recall:  Good  Fund of Knowledge: Good  Language: Good  Akathisia:  No  Handed:  Right  AIMS (if indicated): not done  Assets:  Communication Skills Desire for Improvement  Housing Intimacy Physical Health Social Support  ADL's:  Intact  Cognition: WNL  Sleep:  Poor    Screenings: GAD-7    Flowsheet Row Clinical Support from 06/14/2021 in [facility] Office Visit from 04/30/2021 in [place] Office Visit from 03/22/2021 in [place] from 01/30/2021 in [facility] for [place] [facility] Office Visit from 10/06/2020 in [place] [facility] [place] [facility]  Total GAD-7 Score _0 PHQ2-9    Flowsheet Row Clinical Support from 06/14/2021 in [facility] Office Visit from 04/30/2021 in [place] Office Visit from 03/22/2021 in [facility] ED from 02/05/2021 in [place] from 01/30/2021 in [facility] [place] [facility]  PHQ-2 Total Score _1 PHQ-9 Total Score _2 [place] Office Visit from 03/22/2021 in [facility] ED from 02/05/2021 in [facility] ED to Hosp-Admission (Discharged) from 01/23/2021 in [place] [facility] [place] [facility]  C-SSRS RISK CATEGORY No Risk No Risk No Risk        Assessment and Plan: Patient endorses symptoms of anxiety, depression, and insomnia.  Today she is agreeable to starting BuSpar 10 mg 3 times daily to help manage anxiety and depression.  She is also agreeable to starting Ambien 5 mg to help manage sleep.  Provider discussed discontinuing Ambien within 3 to 6 months.  She endorsed understanding and agreed.  She will continue all other medication as prescribed.  Provider also discussed side effects that could occur while on nortriptyline and duloxetine.  She notes that she will discuss with her neurologist in August about switching her to another medication to control her migraines.  1. Substance induced mood  disorder (HCC)  Continue- DULoxetine (CYMBALTA) 60 MG capsule; Take 1 capsule (60 mg total) by mouth daily.  Dispense: 30 capsule; Refill: 2 Continue- QUEtiapine (SEROQUEL) 50 MG tablet; TAKE 1 TABLET (50 MG TOTAL) BY MOUTH AT BEDTIME.  Dispense: 30 tablet; Refill: 2 Start- zolpidem (AMBIEN) 5 MG tablet; Take 1 tablet (5 mg total) by mouth at bedtime as needed for sleep.  Dispense: 30 tablet; Refill: 2  2. Generalized anxiety disorder  Continue- DULoxetine (CYMBALTA) 60 MG capsule; Take 1 capsule (60 mg total) by mouth daily.  Dispense: 30 capsule; Refill: 2 Continue- hydrOXYzine (ATARAX/VISTARIL) 25 MG tablet; TAKE 1 TABLET (25 MG TOTAL) BY MOUTH 3 (THREE) TIMES DAILY AS NEEDED.  Dispense: 90 tablet; Refill: 2 Continue- QUEtiapine (SEROQUEL) 50 MG tablet; TAKE 1 TABLET (50 MG TOTAL) BY MOUTH AT BEDTIME.  Dispense: 30 tablet; Refill: 2  Follow-up in 3 months Follow-up with therapy   Salley Slaughter, NP 06/14/2021, 9:53 AM

## 2021-06-15 ENCOUNTER — Other Ambulatory Visit: Payer: Self-pay

## 2021-06-15 MED FILL — Atorvastatin Calcium Tab 20 MG (Base Equivalent): ORAL | 30 days supply | Qty: 30 | Fill #2 | Status: AC

## 2021-06-15 MED FILL — Atorvastatin Calcium Tab 20 MG (Base Equivalent): ORAL | 30 days supply | Qty: 30 | Fill #2 | Status: CN

## 2021-06-20 ENCOUNTER — Other Ambulatory Visit: Payer: Self-pay

## 2021-07-02 ENCOUNTER — Other Ambulatory Visit: Payer: Self-pay

## 2021-07-02 MED FILL — Nortriptyline HCl Cap 50 MG: ORAL | 30 days supply | Qty: 30 | Fill #2 | Status: AC

## 2021-07-04 ENCOUNTER — Other Ambulatory Visit: Payer: Self-pay

## 2021-07-05 ENCOUNTER — Other Ambulatory Visit: Payer: Self-pay

## 2021-07-06 ENCOUNTER — Other Ambulatory Visit: Payer: Self-pay

## 2021-07-06 ENCOUNTER — Ambulatory Visit: Payer: Self-pay | Attending: Internal Medicine | Admitting: Pharmacist

## 2021-07-06 DIAGNOSIS — E1165 Type 2 diabetes mellitus with hyperglycemia: Secondary | ICD-10-CM

## 2021-07-06 DIAGNOSIS — Z794 Long term (current) use of insulin: Secondary | ICD-10-CM

## 2021-07-06 LAB — GLUCOSE, POCT (MANUAL RESULT ENTRY): POC Glucose: 187 mg/dl — AB (ref 70–99)

## 2021-07-06 MED ORDER — TRUE METRIX BLOOD GLUCOSE TEST VI STRP
ORAL_STRIP | 2 refills | Status: DC
  Filled 2021-07-06 – 2022-01-23 (×2): qty 100, 33d supply, fill #0

## 2021-07-06 NOTE — Progress Notes (Signed)
    S:    PCP: Dr. Wynetta Emery   No chief complaint on file.  Patient arrives in good spirits.  Presents for diabetes evaluation, education, and management. Patient was referred and last seen by Dr. Wynetta Emery on 04/30/2021. I saw her on 06/07/2021 and increased Trulicity to 3 mg weekly. Additionally, I instructed her to begin taking Novolog BEFORE meals. Today, she endorses adherence to her regimen. She is tolerating the increased dose of Trulicity well. Denies NV, abdominal pain.   Family History: Diabetes (mother and father), HTN (mother and father) Smoking: former smoker 0.25 PPD for 8 years Alcohol: occasional use  Insurance coverage/medication affordability: none  Medication adherence reported.   Current diabetes medications include: Novolog 25-15-25 TID before meals (pt takes after her meals), lantus 62 units every morning, Trulicity 1.5 mg weekly  Patient denies hypoglycemic events.  Patient reported dietary habits:  - Pt endorses trying to limit carbs (potatoes, french fries) - Uses air fryer for most meals to limit fats  Patient-reported exercise habits: limited exercise at the moment due to taking care of her mother   Patient denies nocturia (nighttime urination).  Patient denies neuropathy (nerve pain). Patient denies visual changes. Patient denies self foot exams.    O:  POCT glucose: 187  Lab Results  Component Value Date   HGBA1C 12.4 (A) 04/30/2021   There were no vitals filed for this visit.  Lipid Panel     Component Value Date/Time   CHOL 159 02/04/2020 0955   TRIG 117 02/04/2020 0955   HDL 46 02/04/2020 0955   CHOLHDL 3.5 02/04/2020 0955   CHOLHDL 3.2 11/14/2014 0707   VLDL 19 11/14/2014 0707   LDLCALC 92 02/04/2020 0955   Home fasting blood sugars: no meter. Reports 100s. Checks seldomly. Requesting test strips today.   Clinical Atherosclerotic Cardiovascular Disease (ASCVD): No   The 10-year ASCVD risk score Mikey Bussing DC Jr., et al., 2013) is: 4.6%    Values used to calculate the score:     Age: 50 years     Sex: Female     Is Non-Hispanic African American: Yes     Diabetic: Yes     Tobacco smoker: Yes     Systolic Blood Pressure: 765 mmHg     Is BP treated: No     HDL Cholesterol: 46 mg/dL     Total Cholesterol: 159 mg/dL    A/P: Diabetes longstanding currently uncontrolled, however, sugar in clinic today is improved. She is post prandial this morning and down ~150 mg/dl compared to last month's reading. Patient is able to verbalize appropriate hypoglycemia management plan but she is not experiencing lows. Medication adherence reported.  -Continue Trulicity to 3 mg weekly.  -Continued Lantus 62 units QAM  - Continued Novolog 25-15-25 TID. Advised patient to take before meals.  -Referral auth for nutrition. Provided pt with the number to call to set up an appointment with them.  -Extensively discussed pathophysiology of diabetes, recommended lifestyle interventions, dietary effects on blood sugar control -Counseled on s/sx of and management of hypoglycemia -Next A1C anticipated 07/2021.   Written patient instructions provided.  Total time in face to face counseling 15 minutes.   Follow up PCP Clinic Visit next month.     Benard Halsted, PharmD, [place], [place] [phone]

## 2021-07-09 ENCOUNTER — Other Ambulatory Visit: Payer: Self-pay

## 2021-07-12 ENCOUNTER — Other Ambulatory Visit: Payer: Self-pay

## 2021-07-12 ENCOUNTER — Other Ambulatory Visit: Payer: Self-pay | Admitting: Internal Medicine

## 2021-07-12 DIAGNOSIS — E1165 Type 2 diabetes mellitus with hyperglycemia: Secondary | ICD-10-CM

## 2021-07-12 MED ORDER — NOVOLOG FLEXPEN 100 UNIT/ML ~~LOC~~ SOPN
PEN_INJECTOR | SUBCUTANEOUS | 0 refills | Status: DC
Start: 2021-07-12 — End: 2021-08-08
  Filled 2021-07-12: qty 15, 23d supply, fill #0

## 2021-07-12 MED FILL — Sumatriptan Succinate Tab 100 MG: ORAL | 30 days supply | Qty: 9 | Fill #1 | Status: AC

## 2021-07-12 MED FILL — Nortriptyline HCl Cap 50 MG: ORAL | 30 days supply | Qty: 30 | Fill #3 | Status: AC

## 2021-07-14 NOTE — Progress Notes (Signed)
Cardiology Office Note:    Date:  07/16/2021   ID:  Whitney PITTINGER, DOB 27-Sep-1971, MRN [id number]  PCP:  Ladell Pier, MD   [facility] Providers Cardiologist:  None {  Referring MD: Ladell Pier, MD    History of Present Illness:    Whitney Benton is a 50 y.o. female with a hx of DMII, HLD, obesity, former smoker and depression who was referred by Dr. Wynetta Emery for further evaluation of tachycardia.  The patient states that her pulse has been high for the past couple of years, which has been noted during ER visits. Has some associated SOB and dyspnea on exertion. No lightheadedness, dizziness or syncope. Occasional chest pressure with exertion that is relieved with rest, which has been ongoing for the past couple of months as well. No orthopnea, PND, or LE edema. Prior smoker and quit 1 year ago.   Unfortunately, the patient was recently evicted from her home and does not currently have insurance coverage. We will try to minimize costs/testing as much as possible. Referred her for an orange card as well.   Family history: mother with HTN  Past Medical History:  Diagnosis Date   Anxiety    Depression    Diabetes mellitus without complication ([place])    Trichomonas infection     Past Surgical History:  Procedure Laterality Date   CESAREAN SECTION     VAGINAL HYSTERECTOMY  2006   Fibroids, menorrhagia, benign pathology    Current Medications: Current Meds  Medication Sig   atorvastatin (LIPITOR) 20 MG tablet TAKE 1 TABLET (20 MG TOTAL) BY MOUTH DAILY.   Blood Glucose Monitoring Suppl (TRUE METRIX METER) w/Device KIT Check blood sugars three times a day   busPIRone (BUSPAR) 30 MG tablet Take 1 tablet (30 mg total) by mouth daily.   conjugated estrogens (PREMARIN) vaginal cream Apply 0.5 gram intravaginally 2 times a week   cyclobenzaprine (FLEXERIL) 10 MG tablet TAKE 1 TABLET (10 MG TOTAL) BY MOUTH DAILY AS NEEDED FOR MUSCLE SPASMS. MUST HAVE OFFICE VISIT FOR  REFILLS   Dulaglutide (TRULICITY) 3 YJ/8.5UD SOPN Inject 3 mg as directed once a week.   DULoxetine (CYMBALTA) 60 MG capsule Take 1 capsule (60 mg total) by mouth daily.   glucose blood (TRUE METRIX BLOOD GLUCOSE TEST) test strip Use to check blood sugar 3 times daily   hydrOXYzine (ATARAX/VISTARIL) 25 MG tablet TAKE 1 TABLET (25 MG TOTAL) BY MOUTH 3 (THREE) TIMES DAILY AS NEEDED.   insulin aspart (NOVOLOG FLEXPEN) 100 UNIT/ML FlexPen INJECT 25 UNITS BEFORE BREAKFAST, 15 UNITS BEFORE LUNCH, AND 25 UNITS BEFORE DINNER.   Insulin Glargine (BASAGLAR KWIKPEN) 100 UNIT/ML Inject 62 Units into the skin at bedtime.   Insulin Pen Needle (PEN NEEDLES) 31G X 6 MM MISC Use as directed   metoprolol tartrate (LOPRESSOR) 25 MG tablet Take 0.5 tablets (12.5 mg total) by mouth 2 (two) times daily.   Multiple Vitamin (MULTIVITAMIN ADULT) TABS Take 1 tablet by mouth daily.   nortriptyline (PAMELOR) 50 MG capsule TAKE 1 CAPSULE (50 MG TOTAL) BY MOUTH AT BEDTIME.   ondansetron (ZOFRAN ODT) 4 MG disintegrating tablet Take 1 tablet (4 mg total) by mouth every 8 (eight) hours as needed for nausea or vomiting.   polyethylene glycol-electrolytes (NULYTELY) 420 g solution USE AS DIRECTED   QUEtiapine (SEROQUEL) 50 MG tablet TAKE 1 TABLET (50 MG TOTAL) BY MOUTH AT BEDTIME.   SUMAtriptan (IMITREX) 100 MG tablet TAKE 1 TABLET EARLIEST ONSET OF MIGRAINE.  MAY REPEAT IN 2 HOURS IF HEADACHE PERSISTS OR RECURS. MAXIMUM 2 TABLETS IN 24 HOURS.   topiramate (TOPAMAX) 100 MG tablet Take 1 tablet (100 mg total) by mouth at bedtime.   TRUEPLUS LANCETS 28G MISC Check blood sugars three time a day   zolpidem (AMBIEN) 5 MG tablet Take 1 tablet (5 mg total) by mouth at bedtime as needed for sleep.     Allergies:   Aspirin and Oxycodone   Social History   Socioeconomic History   Marital status: Significant Other    Spouse name: Not on file   Number of children: 3   Years of education: 14   Highest education level: Not on file   Occupational History   Not on file  Tobacco Use   Smoking status: Former    Packs/day: 0.25    Years: 8.00    Pack years: 2.00    Types: Cigarettes   Smokeless tobacco: Never  Vaping Use   Vaping Use: Never used  Substance and Sexual Activity   Alcohol use: Yes    Comment: occasional   Drug use: No   Sexual activity: Yes    Birth control/protection: Surgical  Other Topics Concern   Not on file  Social History Narrative   Patient lives at home with mother and father , one story    Patient has 3 children    Patient is single   Patient has 14 years of education    Patient is right handed    Social Determinants of Radio broadcast assistant Strain: Not on file  Food Insecurity: Food Insecurity Present   Worried About Charity fundraiser in the Last Year: Sometimes true   Arboriculturist in the Last Year: Often true  Transportation Needs: No Transportation Needs   Lack of Transportation (Medical): No   Lack of Transportation (Non-Medical): No  Physical Activity: Not on file  Stress: Not on file  Social Connections: Not on file     Family History: The patient's family history includes Depression in her mother; Diabetes in her father and mother; Hypertension in her father and mother; Mental illness in her mother.  ROS:   Please see the history of present illness.    Review of Systems  Constitutional:  Positive for malaise/fatigue. Negative for chills and fever.  HENT:  Negative for sore throat.   Eyes:  Negative for blurred vision.  Respiratory:  Positive for shortness of breath.   Cardiovascular:  Positive for chest pain and palpitations. Negative for orthopnea, claudication, leg swelling and PND.  Gastrointestinal:  Negative for nausea and vomiting.  Genitourinary:  Negative for flank pain.  Musculoskeletal:  Negative for falls.  Neurological:  Negative for dizziness and loss of consciousness.  Psychiatric/Behavioral:  Negative for substance abuse.      EKGs/Labs/Other Studies Reviewed:    The following studies were reviewed today: CTA 10-Sep-2020:  FINDINGS: CTA CHEST FINDINGS   Cardiovascular: This is a technically adequate evaluation of the pulmonary vasculature. There are no filling defects or pulmonary emboli.   The heart is unremarkable without pericardial effusion. Normal caliber of the thoracic aorta.   Mediastinum/Nodes: No enlarged mediastinal, hilar, or axillary lymph nodes. Thyroid gland, trachea, and esophagus demonstrate no significant findings.   Lungs/Pleura: No airspace disease, effusion, or pneumothorax. The central airways are patent.   Musculoskeletal: No acute or destructive bony lesions. Reconstructed images demonstrate no additional findings.   Review of the MIP images confirms the above findings.  CT ABDOMEN and PELVIS FINDINGS   Hepatobiliary: No focal liver abnormality is seen. No gallstones, gallbladder wall thickening, or biliary dilatation.   Pancreas: Unremarkable. No pancreatic ductal dilatation or surrounding inflammatory changes.   Spleen: Normal in size without focal abnormality.   Adrenals/Urinary Tract: Adrenal glands are unremarkable. Kidneys are normal, without renal calculi, focal lesion, or hydronephrosis. Bladder is unremarkable.   Stomach/Bowel: No bowel obstruction or ileus. Normal appendix right lower quadrant. No bowel wall thickening or inflammatory change.   Vascular/Lymphatic: No significant vascular findings are present. No enlarged abdominal or pelvic lymph nodes.   Reproductive: Status post hysterectomy. No adnexal masses.   Other: No free fluid or free gas. No abdominal wall hernia.   Musculoskeletal: No acute or destructive bony lesions. Reconstructed images demonstrate no additional findings.   Review of the MIP images confirms the above findings.   IMPRESSION: 1. Normal CTA of the chest. No evidence of pulmonary emboli. 2. No acute intra-abdominal or  intrapelvic process.  EKG:  EKG is  ordered today.  The ekg ordered today demonstrates NSR with HR 94  Recent Labs: 08/10/2020: ALT 17 01/25/2021: Hemoglobin 12.5; Platelets 303 04/30/2021: BUN 10; Creatinine, Ser 0.85; Potassium 3.9; Sodium 142; TSH 2.460  Recent Lipid Panel    Component Value Date/Time   CHOL 159 02/04/2020 0955   TRIG 117 02/04/2020 0955   HDL 46 02/04/2020 0955   CHOLHDL 3.5 02/04/2020 0955   CHOLHDL 3.2 11/14/2014 0707   VLDL 19 11/14/2014 0707   LDLCALC 92 02/04/2020 0955         Physical Exam:    VS:  BP 100/70 (BP Location: Left Arm, Patient Position: Sitting, Cuff Size: Large)   Pulse 94   Ht 5' 3"  (1.6 m)   Wt 219 lb (99.3 kg)   SpO2 98%   BMI 38.79 kg/m     Wt Readings from Last 3 Encounters:  07/16/21 219 lb (99.3 kg)  04/30/21 218 lb (98.9 kg)  02/08/21 210 lb 3.2 oz (95.3 kg)     GEN:  Well nourished, well developed in no acute distress HEENT: Normal NECK: No JVD; No carotid bruits LYMPHATICS: No lymphadenopathy CARDIAC: RRR, no murmurs, rubs, gallops RESPIRATORY:  Clear to auscultation without rales, wheezing or rhonchi  ABDOMEN: Soft, non-tender, non-distended MUSCULOSKELETAL:  No edema; No deformity  SKIN: Warm and dry NEUROLOGIC:  Alert and oriented x 3 PSYCHIATRIC:  Normal affect   ASSESSMENT:    1. Tachycardia   2. Chest pain of uncertain etiology   3. Mixed hyperlipidemia   4. Type 2 diabetes mellitus without complication, with long-term current use of insulin (HCC)    PLAN:    In order of problems listed above:  #Tachycardia: Patient with several year history of elevated resting heart rate noted during ER visits. ECG with NSR without known arrhythmias. Reports some associated shortness of breath especially with exertion. Unfortunately, she does not currently have insurance and we cannot pursue a cardiac monitor at this time. Will start BB to see if help symptoms and refer for patient assistance. -Start metop 12.78m BID  for palpitations -Refer for orange card and can consider cardiac monitor once she has patient assistance  #Chest Pain with Exertion: Patient with intermittent episodes of exertional chest heaviness and SOB. CTA chest in 2021 fortunately without significant coronary calcification. Has risk factors for CVD including obesity, DMII and HLD, however, patient is uninsured at this time. Will refer for orange card and can consider TTE and ischemic testing  in the future. Discussed ER precautions for chest pain as well. -Refer for patient assistance (orange card) -Will obtain TTE/ischemic testing once patient has orange card or medicaid as cost prohibitive at this time  #HLD: -Continue lipitor 19m daily -Lipids per PCP  #Poorly controlled DMII on insulin: Managed by PCP -Continue insulin -Continue trulicity       Medication Adjustments/Labs and Tests Ordered: Current medicines are reviewed at length with the patient today.  Concerns regarding medicines are outlined above.  No orders of the defined types were placed in this encounter.  Meds ordered this encounter  Medications   metoprolol tartrate (LOPRESSOR) 25 MG tablet    Sig: Take 0.5 tablets (12.5 mg total) by mouth 2 (two) times daily.    Dispense:  90 tablet    Refill:  2    Patient Instructions  Medication Instructions:   START TAKING METOPROLOL TARTRATE 12.5 MG BY MOUTH TWICE DAILY  *If you need a refill on your cardiac medications before your next appointment, please call your pharmacy*   Follow-Up: [facility] and your [facility] are our [facility].  As part of our continuing mission to provide you with exceptional heart care, we have created designated Provider Care Teams.  These Care Teams include your primary Cardiologist (physician) and Advanced Practice Providers (APPs -  Physician Assistants and Nurse Practitioners) who all work together to provide you with the care you need, when you need it.  We  recommend signing up for the patient portal called "MyChart".  Sign up information is provided on this After Visit Summary.  MyChart is used to connect with patients for Virtual Visits (Telemedicine).  Patients are able to view lab/test results, encounter notes, upcoming appointments, etc.  Non-urgent messages can be sent to your provider as well.   To learn more about what you can do with MyChart, go to [email or web address]    Your next appointment:   6 month(s)  The format for your next appointment:   In Person  Provider:   You may see DR. Makina Skow  or one of the following Advanced Practice Providers on your designated Care Team:   SRichardson Dopp PA-C VRobbie Lis [place]     Signed, HFreada Bergeron MD  07/16/2021 10:45 AM    [place]

## 2021-07-16 ENCOUNTER — Encounter: Payer: Self-pay | Admitting: Cardiology

## 2021-07-16 ENCOUNTER — Other Ambulatory Visit: Payer: Self-pay

## 2021-07-16 ENCOUNTER — Ambulatory Visit (INDEPENDENT_AMBULATORY_CARE_PROVIDER_SITE_OTHER): Payer: Self-pay | Admitting: Cardiology

## 2021-07-16 VITALS — BP 100/70 | HR 94 | Ht 63.0 in | Wt 219.0 lb

## 2021-07-16 DIAGNOSIS — R079 Chest pain, unspecified: Secondary | ICD-10-CM

## 2021-07-16 DIAGNOSIS — E782 Mixed hyperlipidemia: Secondary | ICD-10-CM

## 2021-07-16 DIAGNOSIS — R000 Tachycardia, unspecified: Secondary | ICD-10-CM

## 2021-07-16 DIAGNOSIS — Z794 Long term (current) use of insulin: Secondary | ICD-10-CM

## 2021-07-16 DIAGNOSIS — E119 Type 2 diabetes mellitus without complications: Secondary | ICD-10-CM

## 2021-07-16 MED ORDER — METOPROLOL TARTRATE 25 MG PO TABS
12.5000 mg | ORAL_TABLET | Freq: Two times a day (BID) | ORAL | 2 refills | Status: DC
  Filled 2021-07-16: qty 30, 30d supply, fill #0
  Filled 2021-08-08: qty 30, 30d supply, fill #1
  Filled 2021-09-08: qty 30, 30d supply, fill #2
  Filled 2021-10-12: qty 30, 30d supply, fill #3
  Filled 2021-11-26: qty 30, 30d supply, fill #4
  Filled 2021-12-18: qty 30, 30d supply, fill #5
  Filled 2022-01-23: qty 30, 30d supply, fill #0

## 2021-07-16 NOTE — Patient Instructions (Signed)
Medication Instructions:   START TAKING METOPROLOL TARTRATE 12.5 MG BY MOUTH TWICE DAILY  *If you need a refill on your cardiac medications before your next appointment, please call your pharmacy*   Follow-Up: At [facility], you and your health needs are our priority.  As part of our continuing mission to provide you with exceptional heart care, we have created designated Provider Care Teams.  These Care Teams include your primary Cardiologist (physician) and Advanced Practice Providers (APPs -  Physician Assistants and Nurse Practitioners) who all work together to provide you with the care you need, when you need it.  We recommend signing up for the patient portal called "MyChart".  Sign up information is provided on this After Visit Summary.  MyChart is used to connect with patients for Virtual Visits (Telemedicine).  Patients are able to view lab/test results, encounter notes, upcoming appointments, etc.  Non-urgent messages can be sent to your provider as well.   To learn more about what you can do with MyChart, go to [email or web address].    Your next appointment:   6 month(s)  The format for your next appointment:   In Person  Provider:   You may see DR. PEMBERTON  or one of the following Advanced Practice Providers on your designated Care Team:   Richardson Dopp, PA-C [place], [place]

## 2021-07-24 ENCOUNTER — Other Ambulatory Visit: Payer: Self-pay

## 2021-07-26 ENCOUNTER — Other Ambulatory Visit: Payer: Self-pay | Admitting: Internal Medicine

## 2021-07-26 NOTE — Telephone Encounter (Signed)
Requested medications are due for refill today yes  Requested medications are on the active medication list yes  Last refill 6/29  Last visit 04/2021  Future visit scheduled 07/2021  Notes to clinic No lipid lab since 01/2020, please assess.

## 2021-07-27 ENCOUNTER — Other Ambulatory Visit: Payer: Self-pay

## 2021-07-27 MED ORDER — ATORVASTATIN CALCIUM 20 MG PO TABS
20.0000 mg | ORAL_TABLET | Freq: Every day | ORAL | 0 refills | Status: DC
  Filled 2021-07-27: qty 30, 30d supply, fill #0

## 2021-07-31 ENCOUNTER — Ambulatory Visit: Payer: Self-pay | Attending: Internal Medicine | Admitting: Internal Medicine

## 2021-07-31 ENCOUNTER — Encounter: Payer: Self-pay | Admitting: Internal Medicine

## 2021-07-31 ENCOUNTER — Other Ambulatory Visit: Payer: Self-pay

## 2021-07-31 VITALS — BP 100/82 | HR 89 | Resp 16 | Wt 226.4 lb

## 2021-07-31 DIAGNOSIS — E785 Hyperlipidemia, unspecified: Secondary | ICD-10-CM

## 2021-07-31 DIAGNOSIS — Z5900 Homelessness unspecified: Secondary | ICD-10-CM

## 2021-07-31 DIAGNOSIS — E1169 Type 2 diabetes mellitus with other specified complication: Secondary | ICD-10-CM

## 2021-07-31 DIAGNOSIS — E1165 Type 2 diabetes mellitus with hyperglycemia: Secondary | ICD-10-CM

## 2021-07-31 DIAGNOSIS — Z1211 Encounter for screening for malignant neoplasm of colon: Secondary | ICD-10-CM

## 2021-07-31 DIAGNOSIS — Z2821 Immunization not carried out because of patient refusal: Secondary | ICD-10-CM

## 2021-07-31 DIAGNOSIS — Z794 Long term (current) use of insulin: Secondary | ICD-10-CM

## 2021-07-31 DIAGNOSIS — F331 Major depressive disorder, recurrent, moderate: Secondary | ICD-10-CM

## 2021-07-31 LAB — POCT GLYCOSYLATED HEMOGLOBIN (HGB A1C): HbA1c, POC (controlled diabetic range): 9 % — AB (ref 0.0–7.0)

## 2021-07-31 LAB — GLUCOSE, POCT (MANUAL RESULT ENTRY): POC Glucose: 212 mg/dl — AB (ref 70–99)

## 2021-07-31 NOTE — Progress Notes (Addendum)
Patient ID: Whitney Benton, female    DOB: February 19, 1971  MRN: [id number]  CC: Diabetes and Hypertension   Subjective: Whitney Benton is a 50 y.o. female who presents for chronic disease management. Her concerns today include:  Pt with hx of depression, migraines, mixed incontinence, DM, obesity, HL, vit D def, former smoker   Tachycardia: Referred to cardiology on last visit for tachycardia after TSH was normal.  She saw Dr. Johney Frame.  He wanted to do Holter monitor and an ischemic work-up but patient is uninsured.  He advised her to apply for the orange card/cone discount and follow-up afterwards.  In the meantime he placed her on low-dose of metoprolol which she is taking and tolerating. Patient has not yet applied for the orange card/cone discount.  DIABETES TYPE 2/Obesity Last A1C:   Results for orders placed or performed in visit on 07/31/21  POCT glucose (manual entry)  Result Value Ref Range   POC Glucose 212 (A) 70 - 99 mg/dl  POCT glycosylated hemoglobin (Hb A1C)  Result Value Ref Range   Hemoglobin A1C     HbA1c POC (<> result, manual entry)     HbA1c, POC (prediabetic range)     HbA1c, POC (controlled diabetic range) 9.0 (A) 0.0 - 7.0 %   A1C down 3 points Med Adherence:  [x]  Yes.  She takes Lantus 63 units daily, Trulicity 3 mg weekly and NovoLog 25/15/25.  Reports compliance with taking medications. Medication side effects:  []  Yes    [x]  No Home Monitoring?  []  Yes    [x]  No.  She became homeless on 07/09/2021.  All of her stuff including her meter is packed away.  She is currently staying with her mother.   Home glucose results range: Diet Adherence: [x]  Yes -having to eat what she can get.  Gained 9 lbs since June.  She has gym membership but has not gone. Exercise: []  Yes    [x]  No -lacks enthusiasm Hypoglycemic episodes?: []  Yes    [x]  No Numbness of the feet? []  Yes    []  No Retinopathy hx? []  Yes    []  No Last eye exam: has appt coming up next mth Comments:    HL:  taking Lipitor and tolerating the medication without muscle cramps.  Depression: She is seeing behavioral health.  She reports compliance with taking her medications that include Seroquel and Cymbalta.  Feels she was doing okay until she recently became homeless.  HM: Given mammogram scholarship on last visit.  She has filled it out and sent it in and waiting to be called.  She was given a fit test on last visit.  She states it is packed away and her staff and not sure where to find it.  She is due for pneumonia vaccine.  She declines that today. Patient Active Problem List   Diagnosis Date Noted   Former smoker 04/30/2021   Major depressive disorder, recurrent episode, moderate ([place]) 04/30/2021   Substance induced mood disorder ([place]) 03/22/2021   Generalized anxiety disorder 03/22/2021   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   DKA (diabetic ketoacidosis) ([place]) 01/23/2021   Glaucoma suspect 04/12/2020   DKA, type 2, not at goal [facility]) 10/02/2019   AKI (acute kidney injury) ([place]) 10/02/2019   Hyperkalemia 10/02/2019   Leukocytosis 10/02/2019   Abnormal LFTs 10/02/2019   Stressful life events affecting family and household 08/06/2019   New onset type 2 diabetes mellitus ([place]) 02/04/2019   Obesity (BMI 35.0-39.9 without  comorbidity) 02/04/2019   Tobacco dependence 02/04/2019   Left arm weakness 12/06/2014   Paresthesias/numbness 12/06/2014   Tobacco abuse 11/14/2014   Depression    Mixed incontinence 06/27/2014   History of TVH in 2006 for fibroids and menorrhagia; benign pathology 09/08/2011     Current Outpatient Medications on File Prior to Visit  Medication Sig Dispense Refill   atorvastatin (LIPITOR) 20 MG tablet Take 1 tablet (20 mg total) by mouth daily. 30 tablet 0   Blood Glucose Monitoring Suppl (TRUE METRIX METER) w/Device KIT Check blood sugars three times a day 1 kit 0   busPIRone (BUSPAR) 30 MG tablet Take 1 tablet (30 mg total) by mouth daily. 90  tablet 2   conjugated estrogens (PREMARIN) vaginal cream Apply 0.5 gram intravaginally 2 times a week 42.5 g 1   cyclobenzaprine (FLEXERIL) 10 MG tablet TAKE 1 TABLET (10 MG TOTAL) BY MOUTH DAILY AS NEEDED FOR MUSCLE SPASMS. MUST HAVE OFFICE VISIT FOR REFILLS 30 tablet 0   Dulaglutide (TRULICITY) 3 AT/5.5DD SOPN Inject 3 mg as directed once a week. 2 mL 2   DULoxetine (CYMBALTA) 60 MG capsule Take 1 capsule (60 mg total) by mouth daily. 30 capsule 2   glucose blood (TRUE METRIX BLOOD GLUCOSE TEST) test strip Use to check blood sugar 3 times daily 100 each 2   hydrOXYzine (ATARAX/VISTARIL) 25 MG tablet TAKE 1 TABLET (25 MG TOTAL) BY MOUTH 3 (THREE) TIMES DAILY AS NEEDED. 90 tablet 2   insulin aspart (NOVOLOG FLEXPEN) 100 UNIT/ML FlexPen INJECT 25 UNITS BEFORE BREAKFAST, 15 UNITS BEFORE LUNCH, AND 25 UNITS BEFORE DINNER. 15 mL 0   Insulin Glargine (BASAGLAR KWIKPEN) 100 UNIT/ML Inject 62 Units into the skin at bedtime. 15 mL 4   Insulin Pen Needle (PEN NEEDLES) 31G X 6 MM MISC Use as directed 100 each 0   metoprolol tartrate (LOPRESSOR) 25 MG tablet Take 0.5 tablets (12.5 mg total) by mouth 2 (two) times daily. 90 tablet 2   Multiple Vitamin (MULTIVITAMIN ADULT) TABS Take 1 tablet by mouth daily.     nortriptyline (PAMELOR) 50 MG capsule TAKE 1 CAPSULE (50 MG TOTAL) BY MOUTH AT BEDTIME. 30 capsule 5   ondansetron (ZOFRAN ODT) 4 MG disintegrating tablet Take 1 tablet (4 mg total) by mouth every 8 (eight) hours as needed for nausea or vomiting. 20 tablet 0   pantoprazole (PROTONIX) 20 MG tablet Take 2 tablets (40 mg total) by mouth daily for 14 days. 28 tablet 0   polyethylene glycol-electrolytes (NULYTELY) 420 g solution USE AS DIRECTED 4000 mL 0   QUEtiapine (SEROQUEL) 50 MG tablet TAKE 1 TABLET (50 MG TOTAL) BY MOUTH AT BEDTIME. 30 tablet 2   SUMAtriptan (IMITREX) 100 MG tablet TAKE 1 TABLET EARLIEST ONSET OF MIGRAINE. MAY REPEAT IN 2 HOURS IF HEADACHE PERSISTS OR RECURS. MAXIMUM 2 TABLETS IN 24  HOURS. 10 tablet 5   topiramate (TOPAMAX) 100 MG tablet Take 1 tablet (100 mg total) by mouth at bedtime. 30 tablet 3   TRUEPLUS LANCETS 28G MISC Check blood sugars three time a day 100 each 12   zolpidem (AMBIEN) 5 MG tablet Take 1 tablet (5 mg total) by mouth at bedtime as needed for sleep. 30 tablet 2   [DISCONTINUED] cetirizine (ZYRTEC) 10 MG tablet Take 1 tablet (10 mg total) by mouth daily. (Patient not taking: No sig reported) 30 tablet 1   No current facility-administered medications on file prior to visit.    Allergies  Allergen Reactions  Aspirin Hives   Oxycodone Nausea And Vomiting    Social History   Socioeconomic History   Marital status: Significant Other    Spouse name: Not on file   Number of children: 3   Years of education: 14   Highest education level: Not on file  Occupational History   Not on file  Tobacco Use   Smoking status: Former    Packs/day: 0.25    Years: 8.00    Pack years: 2.00    Types: Cigarettes   Smokeless tobacco: Never  Vaping Use   Vaping Use: Never used  Substance and Sexual Activity   Alcohol use: Yes    Comment: occasional   Drug use: No   Sexual activity: Yes    Birth control/protection: Surgical  Other Topics Concern   Not on file  Social History Narrative   Patient lives at home with mother and father , one story    Patient has 3 children    Patient is single   Patient has 14 years of education    Patient is right handed    Social Determinants of Radio broadcast assistant Strain: Not on file  Food Insecurity: Food Insecurity Present   Worried About Charity fundraiser in the Last Year: Sometimes true   Arboriculturist in the Last Year: Often true  Transportation Needs: No Transportation Needs   Lack of Transportation (Medical): No   Lack of Transportation (Non-Medical): No  Physical Activity: Not on file  Stress: Not on file  Social Connections: Not on file  Intimate Partner Violence: Not on file    Family  History  Problem Relation Age of Onset   Diabetes Mother    Mental illness Mother    Depression Mother    Hypertension Mother    Hypertension Father    Diabetes Father     Past Surgical History:  Procedure Laterality Date   CESAREAN SECTION     VAGINAL HYSTERECTOMY  2006   Fibroids, menorrhagia, benign pathology    ROS: Review of Systems Negative except as stated above  PHYSICAL EXAM: BP 100/82   Pulse 89   Resp 16   Wt 226 lb 6.4 oz (102.7 kg)   SpO2 98%   BMI 40.10 kg/m   Wt Readings from Last 3 Encounters:  07/31/21 226 lb 6.4 oz (102.7 kg)  07/16/21 219 lb (99.3 kg)  06/14/21 217 lb (98.4 kg)    Physical Exam  General appearance - alert, well appearing, obese middle-age African-American female and in no distress Mental status -patient with flat affect. Neck - supple, no significant adenopathy Chest - clear to auscultation, no wheezes, rales or rhonchi, symmetric air entry Heart - normal rate, regular rhythm, normal S1, S2, no murmurs, rubs, clicks or gallops Extremities - peripheral pulses normal, no pedal edema, no clubbing or cyanosis  Depression screen [facility] 2/9 07/31/2021 06/14/2021 04/30/2021  Decreased Interest 3 2 2   Down, Depressed, Hopeless 3 3 2   PHQ - 2 Score 6 5 4   Altered sleeping 3 3 3   Tired, decreased energy 3 3 3   Change in appetite 3 3 3   Feeling bad or failure about yourself  3 1 3   Trouble concentrating 3 3 3   Moving slowly or fidgety/restless 0 0 0  Suicidal thoughts 0 0 0  PHQ-9 Score 21 18 19   Difficult doing work/chores - Very difficult -  Some recent data might be hidden    CMP Latest Ref Rng &  Units 04/30/2021 01/26/2021 01/26/2021  Glucose 65 - 99 mg/dL 186(H) 236(H) 133(H)  BUN 6 - 24 mg/dL 10 <5(L) <5(L)  Creatinine 0.57 - 1.00 mg/dL 0.85 0.69 0.63  Sodium 134 - 144 mmol/L 142 137 140  Potassium 3.5 - 5.2 mmol/L 3.9 3.3(L) 3.1(L)  Chloride 96 - 106 mmol/L 100 103 108  CO2 20 - 29 mmol/L 25 23 24   Calcium 8.7 - 10.2 mg/dL 9.3  8.7(L) 8.3(L)  Total Protein 6.5 - 8.1 g/dL - - -  Total Bilirubin 0.3 - 1.2 mg/dL - - -  Alkaline Phos 38 - 126 U/L - - -  AST 15 - 41 U/L - - -  ALT 0 - 44 U/L - - -   Lipid Panel     Component Value Date/Time   CHOL 159 02/04/2020 0955   TRIG 117 02/04/2020 0955   HDL 46 02/04/2020 0955   CHOLHDL 3.5 02/04/2020 0955   CHOLHDL 3.2 11/14/2014 0707   VLDL 19 11/14/2014 0707   LDLCALC 92 02/04/2020 0955    CBC    Component Value Date/Time   WBC 9.9 01/25/2021 0334   RBC 4.26 01/25/2021 0334   HGB 12.5 01/25/2021 0334   HGB 13.6 07/14/2019 1058   HCT 36.2 01/25/2021 0334   HCT 40.5 07/14/2019 1058   PLT 303 01/25/2021 0334   PLT 343 07/14/2019 1058   MCV 85.0 01/25/2021 0334   MCV 85 07/14/2019 1058   MCH 29.3 01/25/2021 0334   MCHC 34.5 01/25/2021 0334   RDW 14.0 01/25/2021 0334   RDW 13.1 07/14/2019 1058   LYMPHSABS 1.5 01/23/2021 1052   LYMPHSABS 2.4 07/14/2019 1058   MONOABS 0.7 01/23/2021 1052   EOSABS 0.0 01/23/2021 1052   EOSABS 0.1 07/14/2019 1058   BASOSABS 0.0 01/23/2021 1052   BASOSABS 0.0 07/14/2019 1058    ASSESSMENT AND PLAN: 1. Type 2 diabetes mellitus with hyperglycemia, with long-term current use of insulin (HCC) A1c has declined by 3 points.  I commended her on this.  Recommend that she try to recover her glucometer and try to continue checking blood sugars. Encouraged her to use her gym membership as exercise will help to better control diabetes. She is encouraged to try to eat as healthy as she can given her circumstances. Increase Lantus insulin to 67 units daily. Follow-up with clinical pharmacist in 1 month with blood sugar readings. - POCT glucose (manual entry) - POCT glycosylated hemoglobin (Hb A1C)  2. Morbid obesity ([place]) See #1 above.  3. Hyperlipidemia associated with type 2 diabetes mellitus (HCC) Continue atorvastatin.  4. Homelessness Message sent to social worker to touch base with her.  Patient can use help in locating  affordable housing and help with food security.  5. Major depressive disorder, recurrent episode, moderate (HCC) Based on PHQ-9, she seems to have worsened given her recent homelessness.  Encouraged her to keep appointment with her behavioral health specialist.  She is not suicidal at this time.  6. Screening for colon cancer We have given her another fit kit.  Encouraged her to use it today and turn it into the lab before she leaves. - Fecal occult blood, imunochemical([facility])  7. 23-polyvalent pneumococcal polysaccharide vaccine declined       Patient was given the opportunity to ask questions.  Patient verbalized understanding of the plan and was able to repeat key elements of the plan.   Orders Placed This Encounter  Procedures   Fecal occult blood, imunochemical([facility])   POCT glucose (manual  entry)   POCT glycosylated hemoglobin (Hb A1C)     Requested Prescriptions    No prescriptions requested or ordered in this encounter    Return in about 4 months (around 11/30/2021) for Give appt with Lurena Joiner in 1 mth for DM recheck.  Karle Plumber, MD, FACP

## 2021-07-31 NOTE — Patient Instructions (Addendum)
Please call [phone] to schedule mammogram   Increase Lantus insulin to 67 units daily. I will have our social worker touch base with you to provide you with resources in the community to help you.

## 2021-08-01 NOTE — Addendum Note (Signed)
Addended by: Maren Beach, Abia Monaco A on: 08/01/2021 07:56 AM   Modules accepted: Orders

## 2021-08-03 ENCOUNTER — Telehealth: Payer: Self-pay | Admitting: Clinical

## 2021-08-06 NOTE — Telephone Encounter (Signed)
Patient called in to speak to the social worker asking for a call back at Ph# [phone]

## 2021-08-06 NOTE — Telephone Encounter (Signed)
Attempted to return call, no answer, left vm.

## 2021-08-08 ENCOUNTER — Other Ambulatory Visit: Payer: Self-pay

## 2021-08-08 ENCOUNTER — Other Ambulatory Visit: Payer: Self-pay | Admitting: Neurology

## 2021-08-08 ENCOUNTER — Other Ambulatory Visit: Payer: Self-pay | Admitting: Internal Medicine

## 2021-08-08 DIAGNOSIS — E1165 Type 2 diabetes mellitus with hyperglycemia: Secondary | ICD-10-CM

## 2021-08-08 DIAGNOSIS — Z794 Long term (current) use of insulin: Secondary | ICD-10-CM

## 2021-08-08 MED ORDER — NOVOLOG FLEXPEN 100 UNIT/ML ~~LOC~~ SOPN
PEN_INJECTOR | SUBCUTANEOUS | 0 refills | Status: DC
  Filled 2021-08-08: qty 15, 23d supply, fill #0

## 2021-08-09 ENCOUNTER — Other Ambulatory Visit: Payer: Self-pay

## 2021-08-09 ENCOUNTER — Ambulatory Visit (INDEPENDENT_AMBULATORY_CARE_PROVIDER_SITE_OTHER): Payer: No Payment, Other | Admitting: Licensed Clinical Social Worker

## 2021-08-09 ENCOUNTER — Other Ambulatory Visit: Payer: Self-pay | Admitting: Pharmacist

## 2021-08-09 DIAGNOSIS — F331 Major depressive disorder, recurrent, moderate: Secondary | ICD-10-CM | POA: Diagnosis not present

## 2021-08-09 DIAGNOSIS — E119 Type 2 diabetes mellitus without complications: Secondary | ICD-10-CM

## 2021-08-09 MED ORDER — TRUE METRIX METER W/DEVICE KIT
PACK | 0 refills | Status: DC
Start: 2021-08-09 — End: 2023-04-30
  Filled 2021-08-09: qty 1, 30d supply, fill #0

## 2021-08-10 ENCOUNTER — Other Ambulatory Visit: Payer: Self-pay

## 2021-08-13 NOTE — Progress Notes (Signed)
NEUROLOGY FOLLOW UP OFFICE NOTE  Whitney Benton [id number]  Assessment/Plan:   Chronic Migraine without Aura, Without Status Migrainosus, Not Intractable - she is on the right track as there has been reduced duration and frequency.  Migraine prevention:  Increase nortriptyline to 165m at bedtime.  If no improvement in 2  months, would like to start Emgality.  Patient has no insurance so we will have her apply for the [employer] program. Migraine rescue:  Take naproxen 5025mwith sumatriptan 10055mhopefully will have synergistic effect and better efficacy) Limit use of pain relievers to no more than 2 days out of week to prevent risk of rebound or medication-overuse headache. Keep headache diary Follow up 6 months.   Subjective:  Whitney Benton a 49 34ar old right-handed female with diabetes who follows up for migraines and bilateral occipital neuralgia.     UPDATE: Increased nortriptyline in February.  Sumatriptan NS too expensive.  Started higher dose of tablet. Intensity:  Moderate to severe Duration:  if sumatriptan effective, it lasts 1 to 2 hours, otherwise all day.  Effective 50% of the time.   Frequency:  3 days a week    Rescue therapy:  sumatriptan 100m33mequency of abortive medication: daily Current NSAIDS:  none Current analgesics:  Tylenol Current triptans:  sumatriptan 100mg62mrent ergotamine:  none Current anti-emetic:  Zofran ODT 4mg C53ment muscle relaxants:  Flexeril 10mg Q52murrent anti-anxiolytic:  hydroxyzine Current sleep aide:  melatonin Current Antihypertensive medications:  metoprolol tartrate Current Antidepressant medications:  Nortriptyline 50mg at57mtime; Cymbalta 60mg dai58murrent Anticonvulsant medications:  none Current anti-CGRP:  none Current Vitamins/Herbal/Supplements:  Melatonin, B12 Current Antihistamines/Decongestants:  none Other therapy:  none Hormone/birth control:  none   Caffeine:  Cut down on coffee.  Now  only decaff.  No soda Diet:  Drinks water with electrolytes.  Does not skip meals Exercise:  Walks 30 minutes daily Depression:  yes; Anxiety:  yes Other pain:  no Sleep hygiene:  poor   HISTORY:  Migraines since 2000 but returned around 2019.  Initial workup in early 2000s included lumbar puncture but unsure of the results.  She doesn't remember if she had an MRI of the brain.  They are severe sharp and pounding pain in back of head bilaterally and radiates to the front.  She has associated flashes in her vision, photophobia, phonophobia, osmophobia, feels off-balance and sometimes nausea but no  Autonomic symptoms, numbness or weakness.  They usually last 2-3 weeks.  They occur about twice a year, usually in Spring and Summer but can occur other times.  Triggers unknown.  Nothing really relieves them.   CT brain on 06/14/2020 personally reviewed was normal. Eye exam in 2021 okay.   Past NSAIDS:  Meloxicam, ketorolac, ibuprofen, naproxen Past analgesics:  Tylenol, Excedrin Past abortive triptans:  rizatriptan 10mg Past58mrtive ergotamine:  none Past muscle relaxants:  Robaxin Past anti-emetic:  promethazine Past antihypertensive medications:  none Past antidepressant medications:  Sertraline 50mg Past 20mconvulsant medications:  topiramate 100mg Past a3mCGRP:  none Past vitamins/Herbal/Supplements:  none Past antihistamines/decongestants:  Benadryl, Zyrtec Other past therapies:  none     Family history of headache:  no  PAST MEDICAL HISTORY: Past Medical History:  Diagnosis Date   Anxiety    Depression    Diabetes mellitus without complication (HCC)    TricWallulaonas infection     MEDICATIONS: Current Outpatient Medications on File Prior to Visit  Medication Sig Dispense Refill   atorvastatin (  LIPITOR) 20 MG tablet Take 1 tablet (20 mg total) by mouth daily. 30 tablet 0   Blood Glucose Monitoring Suppl (TRUE METRIX METER) w/Device KIT Check blood sugars three times a day 1  kit 0   busPIRone (BUSPAR) 30 MG tablet Take 1 tablet (30 mg total) by mouth daily. 90 tablet 2   conjugated estrogens (PREMARIN) vaginal cream Apply 0.5 gram intravaginally 2 times a week 42.5 g 1   cyclobenzaprine (FLEXERIL) 10 MG tablet TAKE 1 TABLET (10 MG TOTAL) BY MOUTH DAILY AS NEEDED FOR MUSCLE SPASMS. MUST HAVE OFFICE VISIT FOR REFILLS 30 tablet 0   Dulaglutide (TRULICITY) 3 WI/2.0BT SOPN Inject 3 mg as directed once a week. 2 mL 2   DULoxetine (CYMBALTA) 60 MG capsule Take 1 capsule (60 mg total) by mouth daily. 30 capsule 2   glucose blood (TRUE METRIX BLOOD GLUCOSE TEST) test strip Use to check blood sugar 3 times daily 100 each 2   hydrOXYzine (ATARAX/VISTARIL) 25 MG tablet TAKE 1 TABLET (25 MG TOTAL) BY MOUTH 3 (THREE) TIMES DAILY AS NEEDED. 90 tablet 2   insulin aspart (NOVOLOG FLEXPEN) 100 UNIT/ML FlexPen INJECT 25 UNITS BEFORE BREAKFAST, 15 UNITS BEFORE LUNCH, AND 25 UNITS BEFORE DINNER. 15 mL 0   Insulin Glargine (BASAGLAR KWIKPEN) 100 UNIT/ML Inject 62 Units into the skin at bedtime. 15 mL 4   Insulin Pen Needle (PEN NEEDLES) 31G X 6 MM MISC Use as directed 100 each 0   metoprolol tartrate (LOPRESSOR) 25 MG tablet Take 0.5 tablets (12.5 mg total) by mouth 2 (two) times daily. 90 tablet 2   Multiple Vitamin (MULTIVITAMIN ADULT) TABS Take 1 tablet by mouth daily.     nortriptyline (PAMELOR) 50 MG capsule TAKE 1 CAPSULE (50 MG TOTAL) BY MOUTH AT BEDTIME. 30 capsule 5   ondansetron (ZOFRAN ODT) 4 MG disintegrating tablet Take 1 tablet (4 mg total) by mouth every 8 (eight) hours as needed for nausea or vomiting. 20 tablet 0   pantoprazole (PROTONIX) 20 MG tablet Take 2 tablets (40 mg total) by mouth daily for 14 days. 28 tablet 0   polyethylene glycol-electrolytes (NULYTELY) 420 g solution USE AS DIRECTED 4000 mL 0   QUEtiapine (SEROQUEL) 50 MG tablet TAKE 1 TABLET (50 MG TOTAL) BY MOUTH AT BEDTIME. 30 tablet 2   SUMAtriptan (IMITREX) 100 MG tablet TAKE 1 TABLET EARLIEST ONSET OF  MIGRAINE. MAY REPEAT IN 2 HOURS IF HEADACHE PERSISTS OR RECURS. MAXIMUM 2 TABLETS IN 24 HOURS. 10 tablet 5   topiramate (TOPAMAX) 100 MG tablet Take 1 tablet (100 mg total) by mouth at bedtime. 30 tablet 3   TRUEPLUS LANCETS 28G MISC Check blood sugars three time a day 100 each 12   zolpidem (AMBIEN) 5 MG tablet Take 1 tablet (5 mg total) by mouth at bedtime as needed for sleep. 30 tablet 2   [DISCONTINUED] cetirizine (ZYRTEC) 10 MG tablet Take 1 tablet (10 mg total) by mouth daily. (Patient not taking: No sig reported) 30 tablet 1   No current facility-administered medications on file prior to visit.    ALLERGIES: Allergies  Allergen Reactions   Aspirin Hives   Oxycodone Nausea And Vomiting    FAMILY HISTORY: Family History  Problem Relation Age of Onset   Diabetes Mother    Mental illness Mother    Depression Mother    Hypertension Mother    Hypertension Father    Diabetes Father       Objective:  Blood pressure 121/79, pulse Marland Kitchen)  107, height _0  (1.6 m), weight 226 lb 3.2 oz (102.6 kg), SpO2 98 %. General: No acute distress.  Patient appears well-groomed.     Metta Clines, DO  CC: Karle Plumber, MD

## 2021-08-13 NOTE — Progress Notes (Signed)
   THERAPIST PROGRESS NOTE  Session Time: 40 min  Participation Level: Active  Behavioral Response: CasualAlertDepressed and Dysphoric  Type of Therapy: Individual Therapy  Treatment Goals addressed: Communication: dep/coping/grief  Interventions: Solution Focused and Supportive  Summary: Whitney Benton is a 50 y.o. female who presents with hx of MDD. This date pt returns for in person session. Pt last seen 06/06/21. Pt enters office and states "I'm homeless" when asked how she is doing. Pt reports she got final notice to vacate 07/15 and had to be out by 07/18. She states she put everything in storage and is staying at her mother's home. Pt provides many details and is very unhappy with living conditions. Pt states her sister and sister's children who are 61, 20, 70 and 27 are there as well as sister's boyfriend who "does nothing all day". Pt states sister just started working. Pt's son who was living with her reportedly went to stay with his brother. Pt continues to have no income and has been denied for disability for the second time. She states her attorney is appealing. For the first time pt reveals a boyfriend who she has been with since later part of 2018 is helping her financially. He works and was living with her and is paying the storage fee for now. He found another place to go and remains nearby. Pt reports she feels "overwhelmed" but is maintaining day to day functioning. Pt states she has her puppy, which is helping her cope. She denies any substance use. Pt reports she is taking meds as prescribed and feels they are helping. Pt agrees to start walking 2xper wk at a park close to her. When asked about appt with diabetes educator pt states she cannot afford to go. She also states her meter got packed up in the storage shed with "no way" to get to it so she is not checking BS. Reports she is not on a sliding scale and keeping up with insulin/meds. Pt uses [facility]. She agrees to ask them if they  might have a donated meter she can have. LCSW reiterated importance of DM care. Referral to [facility], [facility] and [facility] for education. Pt agrees to go to websites for review. LCSW assessed for pt reading grief lit provided last session. Pt states she did read lit and found it "inspiring" and helpful. Pt reports she went to grave site on Father's Day to talk to father. She states this is helpful for her more so than letter writing which she elected not to do, in part because of the sudden move and overall situation she is in. She reflects on how her father was the one who always helped her cope with difficulties and she is missing him more than ever. LCSW validated feelings and provided additional grief education/support. LCSW reviewed poc including scheduling prior to close of session. Pt states appreciation for care.   Suicidal/Homicidal: Nowithout intent/plan  Therapist Response: Pt remains receptive to care.  Plan: Return again in ~3 weeks.  Diagnosis: Axis I:  MDD, moderate    Hermine Messick, LCSW 08/13/2021

## 2021-08-14 NOTE — Telephone Encounter (Signed)
Spoke with pt and she mentioned that she is experiencing homelessness. Reports that she alternates between staying in her car or at her mother's house. Stated that she has applied for disability and is unable to work due to diabetes. Reports that she applied for disability 1.5 years ago. Reports that she has a Chief Executive Officer who is assisting with her. Reports that the courts just received her medical paperwork this month. Reports that she is continuing to wait. LCSWA informed pt of housing resources and pt stated that she is going to come to clinic to receive paper copy of housing resources. LCSWA advised pt to contact coordinated entry line to get on wait list for shelters.  LCSWA provided housing resources to front office to provide to pt. Pt also mentioned that she attends counseling and medication management with [facility]. Pt thankful for support.

## 2021-08-15 ENCOUNTER — Other Ambulatory Visit: Payer: Self-pay

## 2021-08-15 ENCOUNTER — Ambulatory Visit (INDEPENDENT_AMBULATORY_CARE_PROVIDER_SITE_OTHER): Payer: Self-pay | Admitting: Neurology

## 2021-08-15 ENCOUNTER — Encounter: Payer: Self-pay | Admitting: Neurology

## 2021-08-15 VITALS — BP 121/79 | HR 107 | Ht 63.0 in | Wt 226.2 lb

## 2021-08-15 DIAGNOSIS — G43709 Chronic migraine without aura, not intractable, without status migrainosus: Secondary | ICD-10-CM

## 2021-08-15 MED ORDER — NORTRIPTYLINE HCL 50 MG PO CAPS
100.0000 mg | ORAL_CAPSULE | Freq: Every day | ORAL | 5 refills | Status: DC
  Filled 2021-08-15: qty 60, 30d supply, fill #0

## 2021-08-15 MED ORDER — NAPROXEN 500 MG PO TABS
500.0000 mg | ORAL_TABLET | Freq: Two times a day (BID) | ORAL | 5 refills | Status: DC | PRN
  Filled 2021-08-15 – 2022-01-23 (×2): qty 20, 10d supply, fill #0

## 2021-08-15 NOTE — Patient Instructions (Signed)
Increase nortriptyline to '100mg'$  at bedtime.  If no improvement in 2 months, contact me and we will switch to a different medication Take sumatriptan '100mg'$  with naproxen '500mg'$  at earliest onset of migraine. May repeat in 2 hours.  Maximum 2 doses in 24 hours. Keep headache diary Limit use of pain relievers to no more than 2 days out of week to prevent risk of rebound or medication-overuse headache. Follow up 6 months.

## 2021-08-16 ENCOUNTER — Other Ambulatory Visit: Payer: Self-pay

## 2021-08-18 IMAGING — DX DG FOOT COMPLETE 3+V*R*
3 series · 3 of 3 positions shown · non-contrast
Comparison: Plain films right foot 01/01/2007.

CLINICAL DATA: Right little toe pain for 10-12 days since the
patient stubbed it. Initial encounter.

EXAM:
RIGHT FOOT COMPLETE - 3+ VIEW

[foot ap]
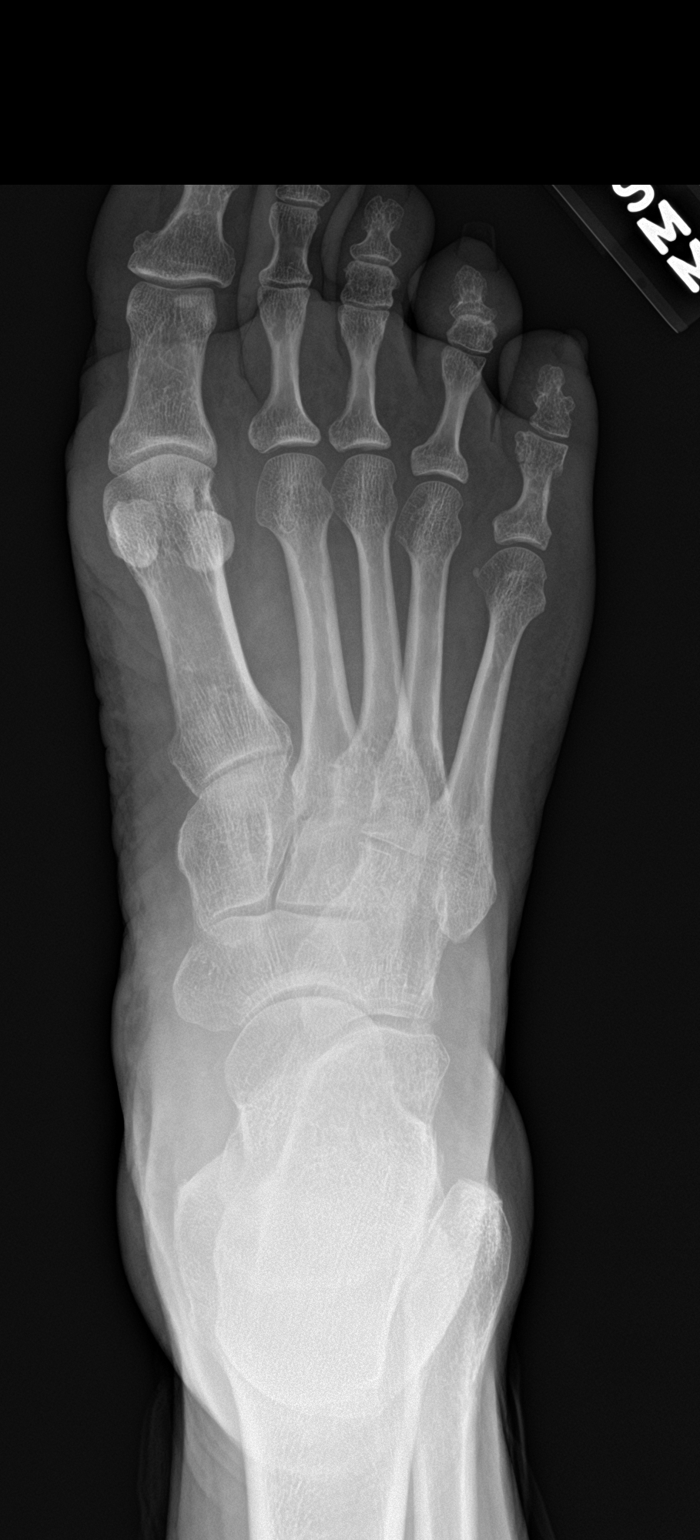

[foot obl]
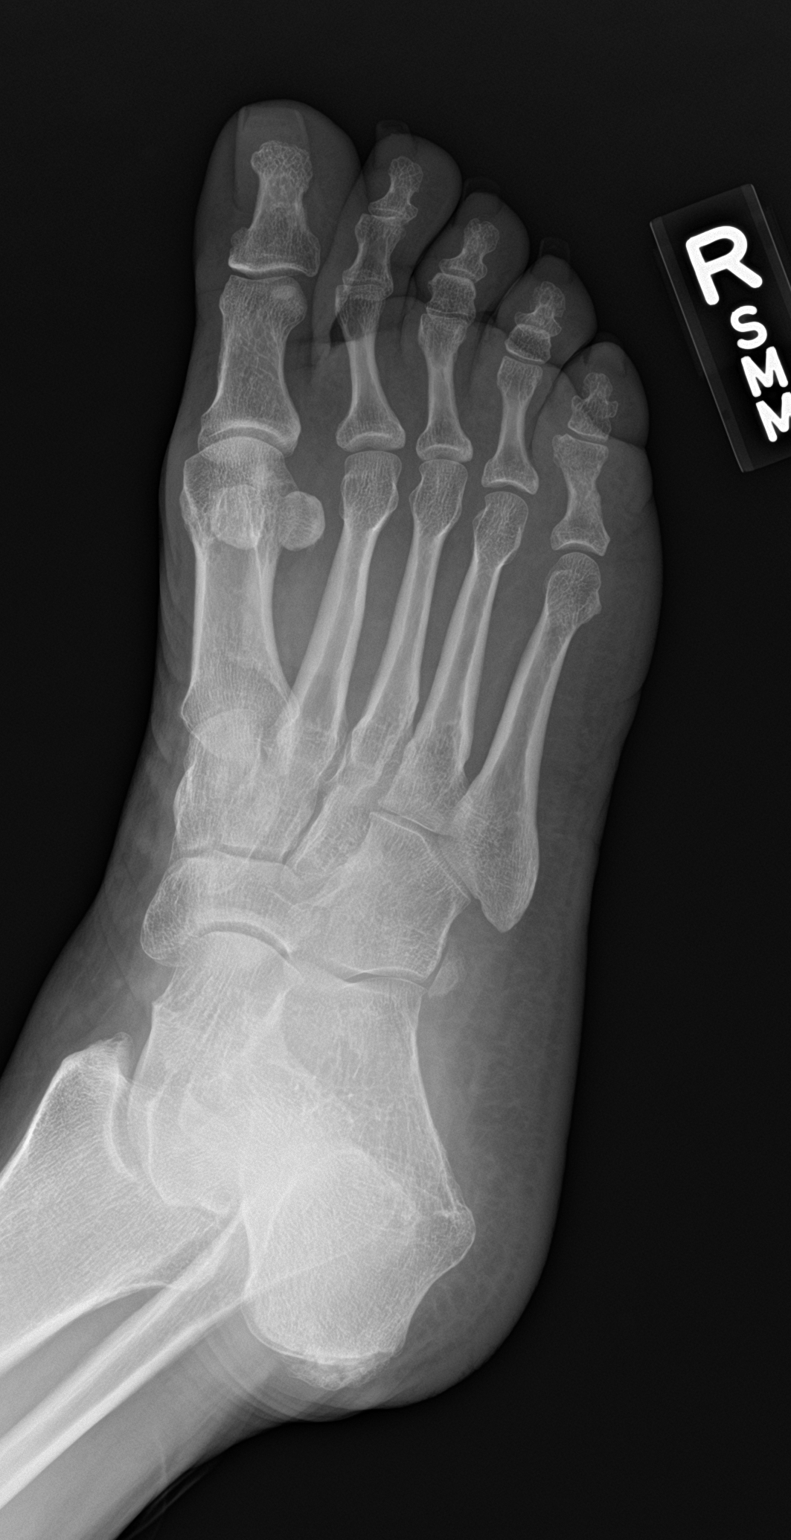

[foot lat]
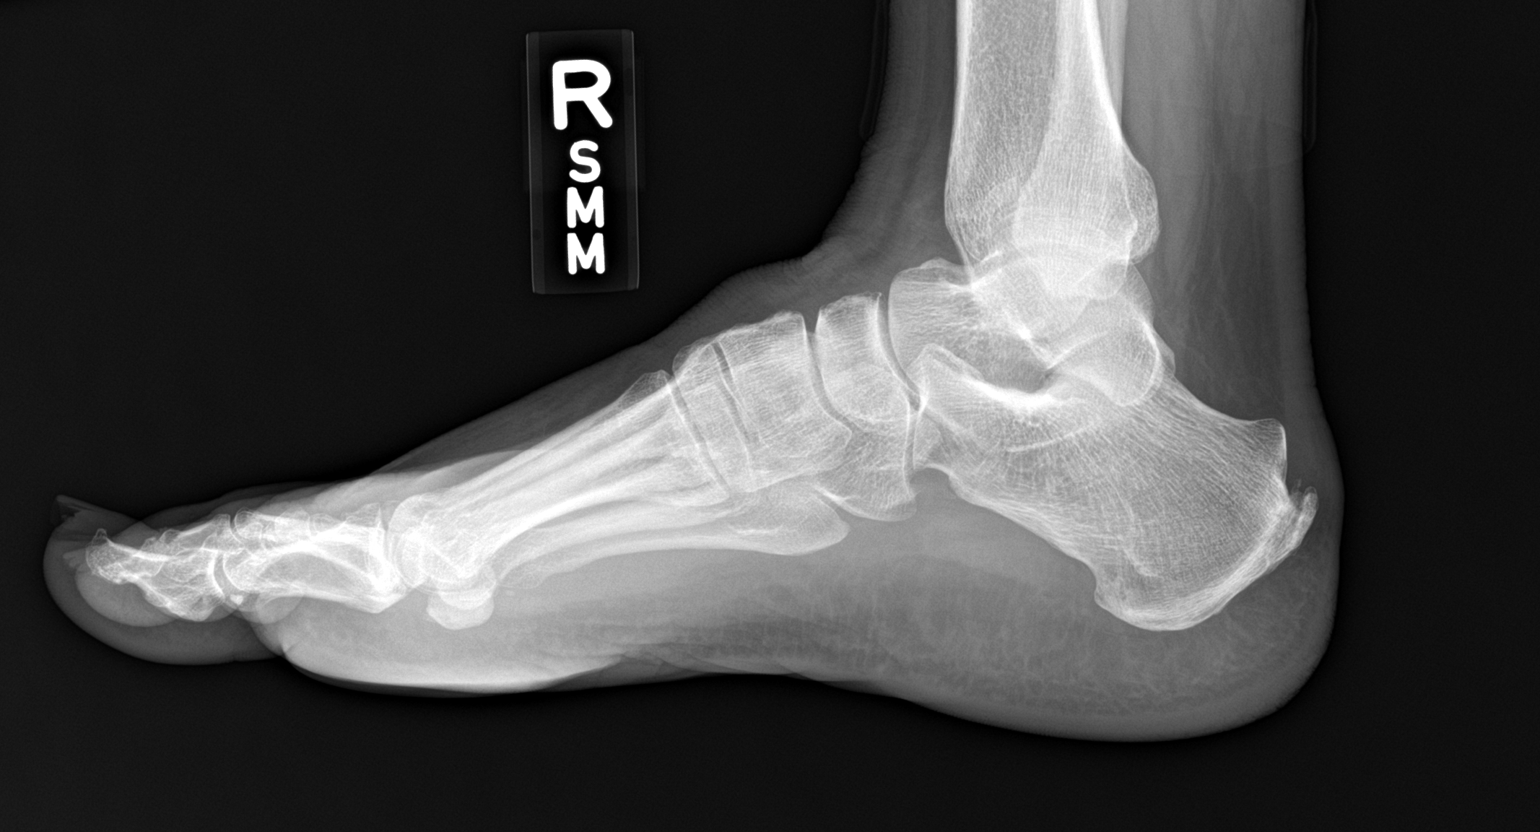

[3 of 3 positions shown; findings below may reference images not displayed]

FINDINGS: The patient has an acute nondisplaced fracture of the distal
metaphysis of the proximal phalanx of the little toe. The fracture
does not involve the articular surface. No other bony abnormality is
seen.
IMPRESSION: Nondisplaced fracture distal metaphysis proximal phalanx right
little toe.

## 2021-08-23 ENCOUNTER — Other Ambulatory Visit: Payer: Self-pay

## 2021-08-23 ENCOUNTER — Other Ambulatory Visit (HOSPITAL_COMMUNITY): Payer: Self-pay | Admitting: Psychiatry

## 2021-08-23 DIAGNOSIS — F411 Generalized anxiety disorder: Secondary | ICD-10-CM

## 2021-08-23 DIAGNOSIS — F1994 Other psychoactive substance use, unspecified with psychoactive substance-induced mood disorder: Secondary | ICD-10-CM

## 2021-08-23 MED ORDER — DULOXETINE HCL 60 MG PO CPEP
60.0000 mg | ORAL_CAPSULE | Freq: Every day | ORAL | 2 refills | Status: DC
  Filled 2021-08-23 – 2021-08-24 (×2): qty 30, 30d supply, fill #0
  Filled 2021-10-12: qty 30, 30d supply, fill #1

## 2021-08-24 ENCOUNTER — Other Ambulatory Visit: Payer: Self-pay

## 2021-08-30 ENCOUNTER — Ambulatory Visit (INDEPENDENT_AMBULATORY_CARE_PROVIDER_SITE_OTHER): Payer: No Payment, Other | Admitting: Licensed Clinical Social Worker

## 2021-08-30 ENCOUNTER — Other Ambulatory Visit: Payer: Self-pay

## 2021-08-30 DIAGNOSIS — F331 Major depressive disorder, recurrent, moderate: Secondary | ICD-10-CM | POA: Diagnosis not present

## 2021-08-31 ENCOUNTER — Other Ambulatory Visit: Payer: Self-pay | Admitting: Pharmacist

## 2021-08-31 ENCOUNTER — Other Ambulatory Visit: Payer: Self-pay | Admitting: Internal Medicine

## 2021-08-31 ENCOUNTER — Other Ambulatory Visit: Payer: Self-pay

## 2021-08-31 MED ORDER — ATORVASTATIN CALCIUM 20 MG PO TABS
20.0000 mg | ORAL_TABLET | Freq: Every day | ORAL | 2 refills | Status: DC
  Filled 2021-08-31: qty 30, 30d supply, fill #0
  Filled 2021-10-04: qty 30, 30d supply, fill #1
  Filled 2021-10-31: qty 30, 30d supply, fill #2

## 2021-09-04 ENCOUNTER — Other Ambulatory Visit: Payer: Self-pay

## 2021-09-04 ENCOUNTER — Encounter: Payer: Self-pay | Admitting: Pharmacist

## 2021-09-04 ENCOUNTER — Ambulatory Visit: Payer: Self-pay | Attending: Internal Medicine | Admitting: Pharmacist

## 2021-09-04 DIAGNOSIS — Z794 Long term (current) use of insulin: Secondary | ICD-10-CM

## 2021-09-04 DIAGNOSIS — E1165 Type 2 diabetes mellitus with hyperglycemia: Secondary | ICD-10-CM

## 2021-09-04 LAB — GLUCOSE, POCT (MANUAL RESULT ENTRY): POC Glucose: 121 mg/dl — AB (ref 70–99)

## 2021-09-04 MED ORDER — BASAGLAR KWIKPEN 100 UNIT/ML ~~LOC~~ SOPN
67.0000 [IU] | PEN_INJECTOR | Freq: Every day | SUBCUTANEOUS | 4 refills | Status: DC
  Filled 2021-09-04: qty 15, 22d supply, fill #0
  Filled 2021-09-28: qty 15, 22d supply, fill #1
  Filled 2021-10-17: qty 15, 22d supply, fill #2
  Filled 2021-11-14: qty 30, 44d supply, fill #3

## 2021-09-04 MED ORDER — TRULICITY 3 MG/0.5ML ~~LOC~~ SOAJ
3.0000 mg | SUBCUTANEOUS | 2 refills | Status: DC
  Filled 2021-09-04: qty 2, 28d supply, fill #0
  Filled 2021-10-04: qty 2, 28d supply, fill #1
  Filled 2021-10-31: qty 2, 28d supply, fill #2

## 2021-09-04 MED ORDER — NOVOLOG FLEXPEN 100 UNIT/ML ~~LOC~~ SOPN
PEN_INJECTOR | SUBCUTANEOUS | 0 refills | Status: DC
Start: 2021-09-04 — End: 2021-10-17
  Filled 2021-09-04: qty 15, 23d supply, fill #0

## 2021-09-04 NOTE — Progress Notes (Signed)
S:    PCP: Dr. Wynetta Emery   No chief complaint on file.  Patient arrives in pretty good spirits. She reports feeling down due to the recent death of her puppies.  Presents for diabetes evaluation, education, and management. Patient was referred and last seen by Dr. Wynetta Emery on 07/31/2021. She was last seen by pharmacy  on [id number] and Trulicity was increased to 3 mg weekly, and instructed to begin taking Novolog BEFORE meals. Today, she endorses adherence to her regimen. She continues to tolerate the Trulicity well. Denies NV, abdominal pain.   Family History: Diabetes (mother and father), HTN (mother and father) Smoking: former smoker 0.25 PPD for 8 years Alcohol: occasional use  Insurance coverage/medication affordability: none  Medication adherence reported.   Current diabetes medications include: Novolog 25-15-25 TID before meals, Lantus 67 units every morning, Trulicity 3 mg weekly  Patient endorses occasional symptoms consistent with hypoglycemic events, but has no HBG readings to evaluate.  Patient reported dietary habits:  -Some dietary indiscretion d/t to current living situation, but still avoids sugary beverages and trying to limit carbs and sweets  Patient-reported exercise habits: walking ~30 mins every day   Patient denies nocturia (nighttime urination).  Patient denies neuropathy (nerve pain). Patient denies visual changes. Patient denies self foot exams.    O:  POCT glucose: 121  Lab Results  Component Value Date   HGBA1C 9.0 (A) 07/31/2021   There were no vitals filed for this visit.  Lipid Panel     Component Value Date/Time   CHOL 159 02/04/2020 0955   TRIG 117 02/04/2020 0955   HDL 46 02/04/2020 0955   CHOLHDL 3.5 02/04/2020 0955   CHOLHDL 3.2 11/14/2014 0707   VLDL 19 11/14/2014 0707   LDLCALC 92 02/04/2020 0955   Home fasting blood sugars: no meter. Reports 100s. Checks seldomly. Requesting test strips today.   Clinical Atherosclerotic  Cardiovascular Disease (ASCVD): No   The 10-year ASCVD risk score (Arnett DK, et al., 2019) is: 7.4%   Values used to calculate the score:     Age: 50 years     Sex: Female     Is Non-Hispanic African American: Yes     Diabetic: Yes     Tobacco smoker: Yes     Systolic Blood Pressure: 123XX123 mmHg     Is BP treated: No     HDL Cholesterol: 46 mg/dL     Total Cholesterol: 159 mg/dL    A/P: Diabetes longstanding, and POCT glucose suggests control is improving. She reports medication adherence. She is fasting this morning with a reading of 121. Patient has not been checking BG at home because she was just able to get a new meter. Patient reports some symptoms indicative of hypoglycemia, so I advised her to check her BG more consistently and especially when she feels the hypoglycemic symptoms we discussed. Patient is able to verbalize appropriate hypoglycemia management. plan but she is not experiencing lows. No changes recommended at this time because patient has no home readings to report and in office fasting BG is in range. -Continue Trulicity to 3 mg weekly.  -Continued Lantus 67 units QAM  - Continued Novolog 25-15-25 TID.  -Extensively discussed pathophysiology of diabetes, recommended lifestyle interventions, dietary effects on blood sugar control -Counseled on s/sx of and management of hypoglycemia -Next A1c anticipated 10/2021.     Written patient instructions provided.  Total time in face to face counseling 20 minutes.   Follow up Clinic Visit next month.  Parthenia Ames, PharmD PGY-1 [place] [phone]

## 2021-09-04 NOTE — Progress Notes (Signed)
   THERAPIST PROGRESS NOTE  Session Time: 30 min  Participation Level: Active  Behavioral Response: CasualAlertDepressed  Type of Therapy: Individual Therapy  Treatment Goals addressed: Communication: dep/coping  Interventions: Solution Focused and Supportive  Summary: Whitney Benton is a 50 y.o. female who presents with hx of dep. This date pt comes for in person session and reveals one of her puppies has died and the others appear sick. She provides details. Pt speaking low and slow with distress in tone. LCSW provided grief support for loss and assisted to problem solve for survival of remaining puppies. Pt verbalizes understanding that without urgent professional veterinary care her other puppies with likely not survive based on info pt reveals r/t S&S of illness. Pt agrees to get the others to professional care post session, which they have never had. She reports her boyfriend will help with funding as needed. Pt states her dep is "9" on a 0-10 scale. She has not started walking. She was able to get a meter for BS checks from [facility] as recommended. Pt reports BS running 115-130 as she is paying more attention to intake. Pt reports she has not started walking in park yet will try. States she has been walking daily to visit Aunt's friend in the hosp, which includes a lot of walking from the parking area to where friend is. Session abbreviated so pt can address ill puppies. LCSW reviewed poc including scheduling prior to close of session. Pt states appreciation for care.   Suicidal/Homicidal: Nowithout intent/plan  Therapist Response: Pt somewhat receptive to care.  Plan: Return again in ~4 weeks.  Diagnosis: Axis I:  MDD, moderate   Hermine Messick, LCSW 09/04/2021

## 2021-09-05 ENCOUNTER — Other Ambulatory Visit: Payer: Self-pay

## 2021-09-10 ENCOUNTER — Other Ambulatory Visit: Payer: Self-pay

## 2021-09-12 ENCOUNTER — Encounter (HOSPITAL_COMMUNITY): Payer: No Payment, Other | Admitting: Psychiatry

## 2021-09-12 ENCOUNTER — Telehealth (HOSPITAL_COMMUNITY): Payer: Self-pay | Admitting: Psychiatry

## 2021-09-12 NOTE — Telephone Encounter (Signed)
Patient left a voicemail stating that she was having issues with her care and needed to reschedule. Also stated that she needed to talk to provider ASAP about medications. Writer did contact patient, but she was not reached. Left a voicemail asking to give [place] a call back at her convenience.

## 2021-09-12 NOTE — Telephone Encounter (Signed)
Patient called for REFILL:  SEROQUEL 50 MG & AMBIEN 5 MG no refills remain on both Pharmacy :  [facility] Patient phone number: Last seen:  03/22/21. Missed Today's appt 09/12/21 (car trouble).   COMMENTS:   Next appt 11/27/21

## 2021-09-13 ENCOUNTER — Other Ambulatory Visit (HOSPITAL_COMMUNITY): Payer: Self-pay | Admitting: Psychiatry

## 2021-09-13 DIAGNOSIS — F411 Generalized anxiety disorder: Secondary | ICD-10-CM

## 2021-09-13 DIAGNOSIS — F1994 Other psychoactive substance use, unspecified with psychoactive substance-induced mood disorder: Secondary | ICD-10-CM

## 2021-09-13 MED ORDER — QUETIAPINE FUMARATE 50 MG PO TABS
ORAL_TABLET | Freq: Every day | ORAL | 2 refills | Status: DC
Start: 2021-09-13 — End: 2021-10-23
  Filled 2021-09-14: qty 30, 30d supply, fill #0

## 2021-09-13 MED ORDER — ZOLPIDEM TARTRATE 5 MG PO TABS: 5.0000 mg | ORAL_TABLET | Freq: Every evening | ORAL | 2 refills | Status: DC | PRN

## 2021-09-13 NOTE — Telephone Encounter (Signed)
Provider attempted to call patient without success.  Medication refilled and sent to preferred pharmacy per patient second phone call request.

## 2021-09-13 NOTE — Telephone Encounter (Signed)
Medication refilled and sent to preferred pharmacy

## 2021-09-14 ENCOUNTER — Other Ambulatory Visit: Payer: Self-pay

## 2021-09-14 MED ORDER — QUETIAPINE FUMARATE 50 MG PO TABS
ORAL_TABLET | ORAL | 2 refills | Status: DC
  Filled 2021-09-14: qty 30, 30d supply, fill #0

## 2021-09-18 ENCOUNTER — Other Ambulatory Visit: Payer: Self-pay

## 2021-09-19 ENCOUNTER — Other Ambulatory Visit: Payer: Self-pay

## 2021-09-19 MED ORDER — FLUOROMETHOLONE 0.1 % OP SUSP
OPHTHALMIC | 0 refills | Status: DC
  Filled 2021-09-19: qty 10, 28d supply, fill #0

## 2021-09-19 MED ORDER — CYCLOSPORINE 0.05 % OP EMUL: OPHTHALMIC | 3 refills | Status: AC

## 2021-09-20 ENCOUNTER — Other Ambulatory Visit: Payer: Self-pay

## 2021-09-21 ENCOUNTER — Other Ambulatory Visit: Payer: Self-pay

## 2021-09-28 ENCOUNTER — Other Ambulatory Visit: Payer: Self-pay

## 2021-10-01 ENCOUNTER — Other Ambulatory Visit: Payer: Self-pay

## 2021-10-02 ENCOUNTER — Ambulatory Visit (HOSPITAL_COMMUNITY): Payer: No Payment, Other | Admitting: Licensed Clinical Social Worker

## 2021-10-04 ENCOUNTER — Other Ambulatory Visit: Payer: Self-pay

## 2021-10-04 ENCOUNTER — Ambulatory Visit: Payer: Self-pay | Attending: Internal Medicine | Admitting: Pharmacist

## 2021-10-04 DIAGNOSIS — Z794 Long term (current) use of insulin: Secondary | ICD-10-CM

## 2021-10-04 DIAGNOSIS — E1165 Type 2 diabetes mellitus with hyperglycemia: Secondary | ICD-10-CM

## 2021-10-04 LAB — GLUCOSE, POCT (MANUAL RESULT ENTRY): POC Glucose: 286 mg/dl — AB (ref 70–99)

## 2021-10-04 NOTE — Progress Notes (Signed)
S:    PCP: Dr. Wynetta Emery   Patient arrives in pretty good spirits. She presents today for diabetes evaluation, education, and management. Patient was referred and last seen by Dr. Wynetta Emery on 07/31/2021 and was last seen by pharmacy on 09/04/2021. Per CPP visit on [id number]/82/9562, Trulicity was increased to 3 mg weekly, and patient was instructed to begin taking Novolog BEFORE meals. Today, she endorses adherence to her regimen though expresses concerns of neuropathy in her hands/fingers. She continues to tolerate the Trulicity well - denies any nausea, vomiting or abdominal pain.   Family History: Diabetes (mother and father), Hypertension (mother and father) Smoking: former smoker 0.25 PPD for 8 years Alcohol: occasional use  Insurance coverage/medication affordability: none  Medication adherence reported.   Current diabetes medications include: Novolog 25-15-25 TID before meals, Basaglar 67 units every morning, Trulicity 3 mg weekly  She reports an at home blood sugar range of 130-150 with outlier blood sugars of 79 and 87. Patient endorses occasional symptoms consistent with hypoglycemic events when her blood sugars are ~79, 87.   Patient reported dietary habits: some dietary indiscretion due to current living situation (currently living with mother) therefore eats what is made at home, but avoids sugary beverages, limiting carbs and sweets. Reports snacking with whole wheat peanut butter crackers and baked chips, and the use of Splenda sweetener.  Patient-reported exercise habits: none reported   Patient denies nocturia (nighttime urination).  Patient reports neuropathy (nerve pain) in her fingers/hands though denies neuropathy of feet. Patient denies visual changes. Patient denies self foot exams.    O:  POCT Glucose: 283 (patient ate [employer] at ~8:15 AM - prior to visit)  Lab Results  Component Value Date   HGBA1C 9.0 (A) 07/31/2021   Lipid Panel     Component Value Date/Time    CHOL 159 02/04/2020 0955   TRIG 117 02/04/2020 0955   HDL 46 02/04/2020 0955   CHOLHDL 3.5 02/04/2020 0955   CHOLHDL 3.2 11/14/2014 0707   VLDL 19 11/14/2014 0707   LDLCALC 92 02/04/2020 0955   Home fasting blood sugars: no meter today. Reports 130s-150s. She has not been checking home blood sugars since onset of neuropathy.  Clinical Atherosclerotic Cardiovascular Disease (ASCVD): No   The 10-year ASCVD risk score (Arnett DK, et al., 2019) is: 8.2%   Values used to calculate the score:     Age: 50 years     Sex: Female     Is Non-Hispanic African American: Yes     Diabetic: Yes     Tobacco smoker: Yes     Systolic Blood Pressure: 130 mmHg     Is BP treated: No     HDL Cholesterol: 46 mg/dL     Total Cholesterol: 159 mg/dL    A/P: Diabetes Goal: A1c < 7%, FBG 80-130 mg/dL, and 2H-PPBG < 180 mg/dL.  Diabetes is longstanding - patient at home blood sugar levels (130-150s) suggestive that blood sugars are improving with medications. She reports medication adherence. Her blood sugar today is 283 though reports having an egg McMuffin and coffee from [employer] around 8:15 AM (Apt at 9 AM). She reports she has taken her AM medications today. Patient has not been checking BG at home because of new onset finger/hand tingling, denies tingling in feet. Patient reports some symptoms indicative of hypoglycemia - I advised her to check her BG more consistently and especially when she feels the hypoglycemic symptoms we discussed. Patient is able to verbalize appropriate hypoglycemia management.  No changes recommended at this time  - Continue Trulicity to 3 mg weekly (Sunday) - Continue Basaglar 67 units QAM  - Continue Novolog 25-15-25 TID BEFORE meals.  - Extensively discussed pathophysiology of diabetes, recommended lifestyle interventions, dietary effects on blood sugar control - Counseled on s/sx of and management of hypoglycemia - Counseled on the importance of checking blood sugars daily -  advised to bring meter to next visit - Counseled on medication related side effects such as nausea, vomiting, diarrhea, abdominal pain - Next A1c anticipated 10/2021.  Follow up with Pharmacy on 11/01/2021 for diabetes management, A1c recheck and lipid panel Follow up with Dr. Wynetta Emery on 12/10/2021   Written patient instructions provided.  Total time in face to face counseling 20 minutes.    Patient seen with: Darcel Smalling, Student Pharmacist [place] [employer] [place], PharmD, [place], [place] [phone]

## 2021-10-05 ENCOUNTER — Other Ambulatory Visit: Payer: Self-pay

## 2021-10-08 ENCOUNTER — Telehealth: Payer: Self-pay | Admitting: Neurology

## 2021-10-08 NOTE — Telephone Encounter (Signed)
Patient called and said neither nortriptyline 50mg  or sumatriptan 100mg  is helping with her headaches.  She was told to call after taking the medicine for at least six weeks.  She'd like Dr. Georgie Chard recommendations.  [employer]

## 2021-10-08 NOTE — Telephone Encounter (Signed)
Telephone call to pt, No answer, LMOVM to call the office back.

## 2021-10-12 ENCOUNTER — Other Ambulatory Visit: Payer: Self-pay

## 2021-10-15 ENCOUNTER — Telehealth: Payer: Self-pay | Admitting: Neurology

## 2021-10-15 NOTE — Telephone Encounter (Signed)
Wrong patient. Information added to the correct Patient chart.

## 2021-10-15 NOTE — Telephone Encounter (Signed)
Whitney Benton, [name] called to report patient is having a GI bleed and her aspirin needs adjusted by Dr. Tomi Likens.   She said this is an urgent matter and needs addressed right away.

## 2021-10-17 ENCOUNTER — Other Ambulatory Visit: Payer: Self-pay | Admitting: Internal Medicine

## 2021-10-17 ENCOUNTER — Other Ambulatory Visit: Payer: Self-pay

## 2021-10-17 DIAGNOSIS — Z794 Long term (current) use of insulin: Secondary | ICD-10-CM

## 2021-10-18 ENCOUNTER — Other Ambulatory Visit: Payer: Self-pay

## 2021-10-18 MED ORDER — NOVOLOG FLEXPEN 100 UNIT/ML ~~LOC~~ SOPN
PEN_INJECTOR | SUBCUTANEOUS | 0 refills | Status: DC
  Filled 2021-10-18: qty 15, 23d supply, fill #0

## 2021-10-18 NOTE — Telephone Encounter (Signed)
Requested Prescriptions  Pending Prescriptions Disp Refills  . insulin aspart (NOVOLOG FLEXPEN) 100 UNIT/ML FlexPen 15 mL 0    Sig: INJECT 25 UNITS BEFORE BREAKFAST, 15 UNITS BEFORE LUNCH, AND 25 UNITS BEFORE DINNER.     Endocrinology:  Diabetes - Insulins Failed - 10/17/2021  3:23 PM      Failed - HBA1C is between 0 and 7.9 and within 180 days    HbA1c, POC (controlled diabetic range)  Date Value Ref Range Status  07/31/2021 9.0 (A) 0.0 - 7.0 % Final         Passed - Valid encounter within last 6 months    Recent Outpatient Visits          2 weeks ago Type 2 diabetes mellitus with hyperglycemia, with long-term current use of insulin [facility])   [place], Annie Main L, RPH-CPP   1 month ago Type 2 diabetes mellitus with hyperglycemia, with long-term current use of insulin [facility])   [place], Annie Main L, RPH-CPP   2 months ago Type 2 diabetes mellitus with hyperglycemia, with long-term current use of insulin ([place])   [place] Karle Plumber B, MD   3 months ago Type 2 diabetes mellitus with hyperglycemia, with long-term current use of insulin [facility])   [place], Annie Main L, RPH-CPP   4 months ago Type 2 diabetes mellitus with hyperglycemia, with long-term current use of insulin [facility])   [place], Jarome Matin, RPH-CPP      Future Appointments            In 2 weeks Daisy Blossom, Jarome Matin, [place]   In 1 month Wynetta Emery, Dalbert Batman, MD [place]   In 3 months Johney Frame, Greer Ee, MD [place], [facility]

## 2021-10-22 ENCOUNTER — Other Ambulatory Visit: Payer: Self-pay

## 2021-10-23 ENCOUNTER — Encounter (HOSPITAL_COMMUNITY): Payer: Self-pay | Admitting: Psychiatry

## 2021-10-23 ENCOUNTER — Other Ambulatory Visit: Payer: Self-pay

## 2021-10-23 ENCOUNTER — Telehealth (INDEPENDENT_AMBULATORY_CARE_PROVIDER_SITE_OTHER): Payer: No Payment, Other | Admitting: Psychiatry

## 2021-10-23 DIAGNOSIS — F411 Generalized anxiety disorder: Secondary | ICD-10-CM

## 2021-10-23 DIAGNOSIS — F1994 Other psychoactive substance use, unspecified with psychoactive substance-induced mood disorder: Secondary | ICD-10-CM | POA: Diagnosis not present

## 2021-10-23 DIAGNOSIS — F5101 Primary insomnia: Secondary | ICD-10-CM | POA: Diagnosis not present

## 2021-10-23 MED ORDER — ZOLPIDEM TARTRATE 5 MG PO TABS: 5.0000 mg | ORAL_TABLET | Freq: Every evening | ORAL | 2 refills | Status: DC | PRN

## 2021-10-23 MED ORDER — BUSPIRONE HCL 15 MG PO TABS
15.0000 mg | ORAL_TABLET | Freq: Three times a day (TID) | ORAL | 3 refills | Status: DC
  Filled 2021-10-23 – 2021-10-31 (×2): qty 90, 30d supply, fill #0
  Filled 2021-12-24: qty 90, 30d supply, fill #1
  Filled 2021-12-31: qty 90, 30d supply, fill #0

## 2021-10-23 MED ORDER — QUETIAPINE FUMARATE 100 MG PO TABS
100.0000 mg | ORAL_TABLET | Freq: Every day | ORAL | 3 refills | Status: DC
  Filled 2021-10-23 – 2021-11-27 (×2): qty 30, 30d supply, fill #0
  Filled 2021-12-18: qty 30, 30d supply, fill #1

## 2021-10-23 MED ORDER — HYDROXYZINE HCL 25 MG PO TABS
ORAL_TABLET | Freq: Three times a day (TID) | ORAL | 3 refills | Status: DC | PRN
  Filled 2021-10-23: qty 90, fill #0
  Filled 2021-12-24 – 2021-12-31 (×2): qty 90, 30d supply, fill #0

## 2021-10-23 MED ORDER — DULOXETINE HCL 60 MG PO CPEP
60.0000 mg | ORAL_CAPSULE | Freq: Every day | ORAL | 3 refills | Status: DC
  Filled 2021-10-23 – 2021-11-26 (×2): qty 30, 30d supply, fill #0
  Filled 2021-12-24: qty 90, 90d supply, fill #1
  Filled 2021-12-31: qty 30, 30d supply, fill #0

## 2021-10-23 NOTE — Progress Notes (Signed)
BH MD/PA/NP OP Progress Note Virtual Visit via Telephone Note  I connected with Whitney Benton on 10/23/21 at  2:30 PM EDT by telephone and verified that I am speaking with the correct person using two identifiers.  Location: Patient: home Provider: Clinic   I discussed the limitations, risks, security and privacy concerns of performing an evaluation and management service by telephone and the availability of in person appointments. I also discussed with the patient that there may be a patient responsible charge related to this service. The patient expressed understanding and agreed to proceed.   I provided 30 minutes of non-face-to-face time during this encounter.  06/14/2021 9:53 AM Whitney Benton  MRN:  [id number]  Chief Complaint: "Im on my way to [place] because my mother is not responding and I'm just stressed"  HPI: 50 year old female seen today for follow up psychiatric evaluation.  She has a psychiatric history of anxiety and depression.  She is currently managed on Seroquel 50 mg nightly, hydroxyzine 50 mg nightly, hydroxyzine 25 mg 3 times daily, Ambien 5 mg nightly, BuSpar 10 mg 3 times daily and Cymbalta 60 mg daily.  She notes her medications are somewhat effective in managing her psychiatric condition.  Today patient was unable to login virtually so assessment was done over the phone.  During exam she was pleasant, cooperative, and engaged in conversation.  She informed Probation officer that since her last visit she has been very stressed.  She notes that her most recent stressor is her mother's physical decline.  She informed Probation officer that she is currently on her way to [facility] as her mother is unresponsive.  She also notes that she continues to have financial stressors and notes that her disability was denied.  She informed Probation officer that she is planning to appeal it.  She also notes that she has been able to find her own housing (reporting that she was kicked out of her apartment)  and notes that she has been living with her mother.   Patient informed writer that the above exacerbates her anxiety and depression.  Provider conducted a GAD-7 and patient scored a 19, at her last visit she scored a 19.  Provider also conducted PHQ-9 and patient scored 19, her last visit she scored 18.  She informed Probation officer that her sleep is not improved with Ambien.  She notes that she wakes up hourly.  She also notes that she has pains in her hand.  She notes recently her fingers has been turning a different color.  She notes that she is seen by Dr. Wynetta Emery at [facility].  Provider informed patient to follow-up with her primary care regarding this issue.  Patient notes that her appetite is adequate.  She denies SI/HI/VAH, mania, or paranoia.   Today she is agreeable to increasing Seroquel 50 mg to 100 mg to help manage anxiety, depression, and sleep.  She is also agreeable to increasing BuSpar at 30 mg daily to 15 mg 3 times daily to help manage anxiety and depression.  She was referred for sleep study (Ambien, nortriptyline, Seroquel, and trazodone were ineffective). She will continue all other medications as prescribed and follow-up with outpatient counseling for therapy.  No other concerns at this time.   . Visit Diagnosis:    ICD-10-CM   1. Substance induced mood disorder (HCC)  F19.94 DULoxetine (CYMBALTA) 60 MG capsule    QUEtiapine (SEROQUEL) 50 MG tablet    zolpidem (AMBIEN) 5 MG tablet  2. Generalized anxiety disorder  F41.1 DULoxetine (CYMBALTA) 60 MG capsule    hydrOXYzine (ATARAX/VISTARIL) 25 MG tablet    QUEtiapine (SEROQUEL) 50 MG tablet      Past Psychiatric History: anxiety and depression  Past Medical History:  Past Medical History:  Diagnosis Date   Anxiety    Depression    Diabetes mellitus without complication ([place])    Trichomonas infection     Past Surgical History:  Procedure Laterality Date   CESAREAN SECTION     VAGINAL HYSTERECTOMY  2006    Fibroids, menorrhagia, benign pathology    Family Psychiatric History: Mother schizophrenia and bipolar disorder  Family History:  Family History  Problem Relation Age of Onset   Diabetes Mother    Mental illness Mother    Depression Mother    Hypertension Mother    Hypertension Father    Diabetes Father     Social History:  Social History   Socioeconomic History   Marital status: Significant Other    Spouse name: Not on file   Number of children: 3   Years of education: 14   Highest education level: Not on file  Occupational History   Not on file  Tobacco Use   Smoking status: Former    Packs/day: 0.25    Years: 8.00    Pack years: 2.00    Types: Cigarettes   Smokeless tobacco: Never  Vaping Use   Vaping Use: Never used  Substance and Sexual Activity   Alcohol use: Yes    Comment: occasional   Drug use: No   Sexual activity: Yes    Birth control/protection: Surgical  Other Topics Concern   Not on file  Social History Narrative   Patient lives at home with mother and father , one story    Patient has 3 children    Patient is single   Patient has 14 years of education    Patient is right handed    Social Determinants of Radio broadcast assistant Strain: Not on file  Food Insecurity: Food Insecurity Present   Worried About Charity fundraiser in the Last Year: Sometimes true   Arboriculturist in the Last Year: Often true  Transportation Needs: No Transportation Needs   Lack of Transportation (Medical): No   Lack of Transportation (Non-Medical): No  Physical Activity: Not on file  Stress: Not on file  Social Connections: Not on file    Allergies:  Allergies  Allergen Reactions   Aspirin Hives   Oxycodone Nausea And Vomiting    Metabolic Disorder Labs: Lab Results  Component Value Date   HGBA1C 12.4 (A) 04/30/2021   MPG 380.93 10/02/2019   MPG 289.09 01/06/2019   No results found for: PROLACTIN Lab Results  Component Value Date   CHOL  159 02/04/2020   TRIG 117 02/04/2020   HDL 46 02/04/2020   CHOLHDL 3.5 02/04/2020   VLDL 19 11/14/2014   LDLCALC 92 02/04/2020   LDLCALC 124 (H) 02/04/2019   Lab Results  Component Value Date   TSH 2.460 04/30/2021   TSH 0.014 (L) 10/02/2019    Therapeutic Level Labs: No results found for: LITHIUM No results found for: VALPROATE No components found for:  CBMZ  Current Medications: Current Outpatient Medications  Medication Sig Dispense Refill   busPIRone (BUSPAR) 30 MG tablet Take 1 tablet (30 mg total) by mouth daily. 90 tablet 2   insulin aspart (NOVOLOG FLEXPEN) 100 UNIT/ML FlexPen INJECT 25 UNITS BEFORE  BREAKFAST, 15 UNITS BEFORE LUNCH, AND 25 UNITS BEFORE DINNER. 15 mL 0   Insulin Glargine (BASAGLAR KWIKPEN) 100 UNIT/ML Inject 62 Units into the skin at bedtime. 15 mL 4   Insulin Pen Needle (PEN NEEDLES) 31G X 6 MM MISC Use as directed 100 each 0   Multiple Vitamin (MULTIVITAMIN ADULT) TABS Take 1 tablet by mouth daily.     nortriptyline (PAMELOR) 50 MG capsule TAKE 1 CAPSULE (50 MG TOTAL) BY MOUTH AT BEDTIME. 30 capsule 5   ondansetron (ZOFRAN ODT) 4 MG disintegrating tablet Take 1 tablet (4 mg total) by mouth every 8 (eight) hours as needed for nausea or vomiting. 20 tablet 0   polyethylene glycol-electrolytes (NULYTELY) 420 g solution USE AS DIRECTED 4000 mL 0   SUMAtriptan (IMITREX) 100 MG tablet TAKE 1 TABLET EARLIEST ONSET OF MIGRAINE. MAY REPEAT IN 2 HOURS IF HEADACHE PERSISTS OR RECURS. MAXIMUM 2 TABLETS IN 24 HOURS. 10 tablet 5   topiramate (TOPAMAX) 100 MG tablet Take 1 tablet (100 mg total) by mouth at bedtime. 30 tablet 3   TRUEPLUS LANCETS 28G MISC Check blood sugars three time a day 100 each 12   zolpidem (AMBIEN) 5 MG tablet Take 1 tablet (5 mg total) by mouth at bedtime as needed for sleep. 30 tablet 2   atorvastatin (LIPITOR) 20 MG tablet TAKE 1 TABLET (20 MG TOTAL) BY MOUTH DAILY. 30 tablet 3   Blood Glucose Monitoring Suppl (TRUE METRIX METER) w/Device KIT  Check blood sugars three times a day 1 kit 0   conjugated estrogens (PREMARIN) vaginal cream Apply 0.5 gram intravaginally 2 times a week 42.5 g 1   cyclobenzaprine (FLEXERIL) 10 MG tablet TAKE 1 TABLET (10 MG TOTAL) BY MOUTH DAILY AS NEEDED FOR MUSCLE SPASMS. MUST HAVE OFFICE VISIT FOR REFILLS 30 tablet 0   Dulaglutide (TRULICITY) 3 XI/3.3AS SOPN Inject 3 mg as directed once a week. 2 mL 2   DULoxetine (CYMBALTA) 60 MG capsule Take 1 capsule (60 mg total) by mouth daily. 30 capsule 2   glucose blood test strip CHECK BLOOD SUGARS THREE TIMES A DAY (Patient taking differently: CHECK BLOOD SUGARS THREE TIMES A DAY) 100 strip 12   hydrOXYzine (ATARAX/VISTARIL) 25 MG tablet TAKE 1 TABLET (25 MG TOTAL) BY MOUTH 3 (THREE) TIMES DAILY AS NEEDED. 90 tablet 2   pantoprazole (PROTONIX) 20 MG tablet Take 2 tablets (40 mg total) by mouth daily for 14 days. 28 tablet 0   QUEtiapine (SEROQUEL) 50 MG tablet TAKE 1 TABLET (50 MG TOTAL) BY MOUTH AT BEDTIME. 30 tablet 2   No current facility-administered medications for this visit.     Musculoskeletal: Strength & Muscle Tone: within normal limits Gait & Station: normal Patient leans: N/A  Psychiatric Specialty Exam: Review of Systems  Blood pressure 107/88, pulse (!) 107, height 5' 3"  (1.6 m), weight 217 lb (98.4 kg).Body mass index is 38.44 kg/m.  General Appearance: Well Groomed  Eye Contact:  Good  Speech:  Clear and Coherent  Volume:  Normal  Mood:  Anxious and Depressed  Affect:  Appropriate and Congruent  Thought Process:  Coherent, Goal Directed, and Linear  Orientation:  Full (Time, Place, and Person)  Thought Content: WDL and Logical   Suicidal Thoughts:  No  Homicidal Thoughts:  No  Memory:  Immediate;   Good Recent;   Good Remote;   Good  Judgement:  Good  Insight:  Good  Psychomotor Activity:  Normal  Concentration:  Concentration: Good and Attention Span: Good  Recall:  Good  Fund of Knowledge: Good  Language: Good  Akathisia:   No  Handed:  Right  AIMS (if indicated): not done  Assets:  Communication Skills Desire for Improvement Housing Intimacy Physical Health Social Support  ADL's:  Intact  Cognition: WNL  Sleep:  Poor   Screenings: GAD-7    Flowsheet Row Clinical Support from 06/14/2021 in [facility] Office Visit from 04/30/2021 in [place] Office Visit from 03/22/2021 in [place] from 01/30/2021 in Center for [place] at [facility] Office Visit from 10/06/2020 in [place] for [place] at [facility]  Total GAD-7 Score 19 21 20 20 21       PHQ2-9    Flowsheet Row Clinical Support from 06/14/2021 in [facility] Office Visit from 04/30/2021 in [place] Office Visit from 03/22/2021 in [facility] ED from 02/05/2021 in [place] from 01/30/2021 in Center for [place] at [facility]  PHQ-2 Total Score 5 4 6 6 2   PHQ-9 Total Score 18 19 23 24 15       [place] Visit from 03/22/2021 in [facility] ED from 02/05/2021 in [facility] ED to Hosp-Admission (Discharged) from 01/23/2021 in [place] [facility] [place] [facility]  C-SSRS RISK CATEGORY No Risk No Risk No Risk        Assessment and Plan: Patient endorses symptoms of anxiety, depression, and insomnia.  Today she is agreeable to increasing Seroquel 50 mg to 100 mg to help manage anxiety, depression, and sleep.  She is also agreeable to increasing BuSpar at 30 mg daily to 15 mg 3 times daily to help manage anxiety and depression.  She was referred for sleep study.  She will continue all other medications as prescribed.   1. Substance induced mood disorder  (HCC)  Continue- zolpidem (AMBIEN) 5 MG tablet; Take 1 tablet (5 mg total) by mouth at bedtime as needed for sleep.  Dispense: 30 tablet; Refill: 2 Increased- QUEtiapine (SEROQUEL) 100 MG tablet; Take 1 tablet (100 mg total) by mouth at bedtime.  Dispense: 30 tablet; Refill: 3 Continue- DULoxetine (CYMBALTA) 60 MG capsule; Take 1 capsule (60 mg total) by mouth daily.  Dispense: 30 capsule; Refill: 3  2. Generalized anxiety disorder  Increased- QUEtiapine (SEROQUEL) 100 MG tablet; Take 1 tablet (100 mg total) by mouth at bedtime.  Dispense: 30 tablet; Refill: 3 continue- hydrOXYzine (ATARAX/VISTARIL) 25 MG tablet; TAKE 1 TABLET (25 MG TOTAL) BY MOUTH 3 (THREE) TIMES DAILY AS NEEDED.  Dispense: 90 tablet; Refill: 3 Continue- DULoxetine (CYMBALTA) 60 MG capsule; Take 1 capsule (60 mg total) by mouth daily.  Dispense: 30 capsule; Refill: 3  3. Primary insomnia  - Polysomnography 4 or more parameters; Future Continue- zolpidem (AMBIEN) 5 MG tablet; Take 1 tablet (5 mg total) by mouth at bedtime as needed for sleep.  Dispense: 30 tablet; Refill: 2   Follow-up in 3 months Follow-up with therapy   Salley Slaughter, NP 06/14/2021, 9:53 AM

## 2021-10-30 ENCOUNTER — Other Ambulatory Visit (HOSPITAL_COMMUNITY)
Admission: RE | Admit: 2021-10-30 | Discharge: 2021-10-30 | Disposition: A | Discharge status: Home / Self Care | Payer: Medicaid Other | Source: Ambulatory Visit | Attending: Family Medicine | Admitting: Family Medicine

## 2021-10-30 ENCOUNTER — Ambulatory Visit (INDEPENDENT_AMBULATORY_CARE_PROVIDER_SITE_OTHER): Payer: Self-pay

## 2021-10-30 ENCOUNTER — Other Ambulatory Visit: Payer: Self-pay

## 2021-10-30 VITALS — BP 122/78 | HR 103 | Ht 63.0 in | Wt 223.5 lb

## 2021-10-30 DIAGNOSIS — N898 Other specified noninflammatory disorders of vagina: Secondary | ICD-10-CM

## 2021-10-30 NOTE — Progress Notes (Addendum)
Pt here today for c/o vaginal discharge that's grey,  itching x 2 weeks. Denies any odor. Has hx of BV and yeast in past. Pt also has Rx Premarin that she is not currently using due to current life stressors taking care of critical ill mother and new grand baby. Pt advised could be having vaginal dryness too. Pt advised will do self swab today. Pt agreeable to plan of care. If all negative, pt advised to start using Rx Premarin. Pt also states if has + BV wants Metrogel Rx, not Flagyl.  Self swab collected today. Pt advised results will take 24-48 hours and will see results in mychart and will be notified if needs further treatment. Pt verbalized understanding.  Shailyn Weyandt,RN   Chart reviewed for nurse visit. Agree with plan of care.   Virginia Rochester, NP 10/30/2021 12:06 PM

## 2021-10-31 ENCOUNTER — Other Ambulatory Visit: Payer: Self-pay

## 2021-10-31 ENCOUNTER — Other Ambulatory Visit: Payer: Self-pay | Admitting: Nurse Practitioner

## 2021-10-31 LAB — CERVICOVAGINAL ANCILLARY ONLY
Bacterial Vaginitis (gardnerella): POSITIVE — AB
Candida Glabrata: NEGATIVE
Candida Vaginitis: POSITIVE — AB
Chlamydia: NEGATIVE
Comment: NEGATIVE
Comment: NEGATIVE
Comment: NEGATIVE
Comment: NEGATIVE
Comment: NEGATIVE
Comment: NORMAL
Neisseria Gonorrhea: NEGATIVE
Trichomonas: NEGATIVE

## 2021-10-31 MED ORDER — FLUCONAZOLE 150 MG PO TABS
150.0000 mg | ORAL_TABLET | Freq: Once | ORAL | 0 refills | Status: AC
  Filled 2021-10-31: qty 1, 1d supply, fill #0

## 2021-10-31 MED ORDER — METRONIDAZOLE 0.75 % VA GEL
1.0000 | Freq: Every day | VAGINAL | 0 refills | Status: AC
  Filled 2021-10-31: qty 70, 7d supply, fill #0

## 2021-10-31 NOTE — Progress Notes (Signed)
    S:    PCP: Karle Plumber, MD  Patient arrives to the clinic 11/01/21 for a follow-up diabetes evaluation and management. Patient was last seen 10/04/21 by Dr. Acquanetta Belling Ausdall. She is currently taking Trulicity, Engineer, agricultural, and [employer]. She reports her mother has been in the ICU since October and is currently unresponsive. Understandably, she reports being non-adherent to her Novolog and eating more than usual. She has lost her blood glucose supplies and has not been able to track her sugars.   FH: DM, HTN, MDD in mother; DM, HTN in father SH: occasionally drinks, former 0.25 PPD smoker for 8 years; became homeless due to financial strain, living with critically ill mother and has 3 children   Human resources officer affordability: on medicaid  Medication adherence denied.   Current diabetes medications include: Trulicity 3mg  weekly, Basaglar 67 units at bedtime, Novolog 25, 15, and 25 units Current hypertension medications include: Metoprolol 12.5mg  BID,  Current hyperlipidemia medications include: Atorvastatin 20mg  daily  Patient denies hypoglycemic events.  Patient reported dietary habits: reports eating and snaking more than usual  Patient reports nocturia (nighttime urination) but contributes it to increased intake of fluids.  Patient reports neuropathy (nerve pain) in her hands; no change from last visit. Patient denies visual changes. Patient reports self foot exams.     O:  Lab Results  Component Value Date   HGBA1C 10.7 (A) 11/01/2021   Lipid Panel     Component Value Date/Time   CHOL 159 02/04/2020 0955   TRIG 117 02/04/2020 0955   HDL 46 02/04/2020 0955   CHOLHDL 3.5 02/04/2020 0955   CHOLHDL 3.2 11/14/2014 0707   VLDL 19 11/14/2014 0707   LDLCALC 92 02/04/2020 0955   Home fasting blood sugars: not using meter  2 hour post-meal/random blood sugars: not using meter  Clinical Atherosclerotic Cardiovascular Disease (ASCVD): No  The 10-year ASCVD risk  score (Arnett DK, et al., 2019) is: 8.4%   Values used to calculate the score:     Age: 50 years     Sex: Female     Is Non-Hispanic African American: Yes     Diabetic: Yes     Tobacco smoker: Yes     Systolic Blood Pressure: 371 mmHg     Is BP treated: No     HDL Cholesterol: 46 mg/dL     Total Cholesterol: 159 mg/dL   A/P: Diabetes longstanding currently uncontrolled. Patient is able to verbalize appropriate hypoglycemia management plan. Medication adherence appears poor. Also, pt is not adherent to checking home CBGs making it difficult to adjust insulin dose. Control is suboptimal due to life stressors. -Continued basal insulin Basaglar (insulin glargine) 67 units at bedtime -Continued  rapid insulin Novolog (insulin aspart) 25, 15, 25 TID before meals -Increased dose of GLP-1 Trulicity (generic name Dulaglutide) to 4.5mg  once weekly.  -Counseled on s/sx of and management of hypoglycemia -Next A1C anticipated 02/01/22 -Lipid panel due but postponed due to circumstances  Daralene Milch, PharmD Candidate [place], PharmD, [place], [place] [phone]

## 2021-11-01 ENCOUNTER — Other Ambulatory Visit: Payer: Self-pay

## 2021-11-01 ENCOUNTER — Ambulatory Visit: Payer: Self-pay | Attending: Internal Medicine | Admitting: Pharmacist

## 2021-11-01 DIAGNOSIS — E1165 Type 2 diabetes mellitus with hyperglycemia: Secondary | ICD-10-CM

## 2021-11-01 DIAGNOSIS — Z794 Long term (current) use of insulin: Secondary | ICD-10-CM

## 2021-11-01 LAB — POCT GLYCOSYLATED HEMOGLOBIN (HGB A1C): HbA1c, POC (controlled diabetic range): 10.7 % — AB (ref 0.0–7.0)

## 2021-11-01 MED ORDER — TRULICITY 4.5 MG/0.5ML ~~LOC~~ SOAJ
4.5000 mg | SUBCUTANEOUS | 2 refills | Status: DC
Start: 2021-11-01 — End: 2021-11-28
  Filled 2021-11-01: qty 2, 28d supply, fill #0
  Filled 2021-11-26: qty 2, 28d supply, fill #1

## 2021-11-07 ENCOUNTER — Other Ambulatory Visit: Payer: Self-pay | Admitting: Neurology

## 2021-11-07 ENCOUNTER — Other Ambulatory Visit: Payer: Self-pay

## 2021-11-07 ENCOUNTER — Telehealth: Payer: Self-pay | Admitting: Neurology

## 2021-11-07 MED ORDER — EMGALITY 120 MG/ML ~~LOC~~ SOAJ
120.0000 mg | SUBCUTANEOUS | 5 refills | Status: DC
  Filled 2021-11-07: qty 1, 28d supply, fill #0

## 2021-11-07 MED ORDER — EMGALITY 120 MG/ML ~~LOC~~ SOAJ: 240.0000 mg | Freq: Once | SUBCUTANEOUS | 0 refills | Status: AC

## 2021-11-07 NOTE — Telephone Encounter (Signed)
Patient called and is continuing to have a migraine for the last 3 three weeks.   She said Dr. Tomi Likens was considering switching her medication but she's had a lot going on.  [facility]

## 2021-11-07 NOTE — Telephone Encounter (Signed)
How frequent or the headaches (on average, how many days a week/month are they occurring)?  3 weeks How long do the headaches last?  All day Verify what preventative medication and dose you are taking (e.g. topiramate, propranolol, amitriptyline, Emgality, etc)  Nortriptyline 100mg , Topamax 100 mg Verify which rescue medication you are taking (triptan, Advil, Excedrin, Aleve, Ubrelvy, etc)  Sumatriptan 100 mg How often are you taking pain relievers/analgesics/rescue mediction?  Naproxen

## 2021-11-08 ENCOUNTER — Telehealth: Payer: Self-pay | Admitting: General Practice

## 2021-11-08 ENCOUNTER — Other Ambulatory Visit: Payer: Self-pay

## 2021-11-08 ENCOUNTER — Ambulatory Visit (HOSPITAL_COMMUNITY): Payer: No Payment, Other | Admitting: Licensed Clinical Social Worker

## 2021-11-08 NOTE — Telephone Encounter (Signed)
Patient called and left message on nurse voicemail line stating she wants to know how to go about treating her BV differently and how she keeps getting this infection.

## 2021-11-08 NOTE — Telephone Encounter (Signed)
Pt advised of Dr.Jaffe note, 1. Since she has Medicaid now, I will place order for [facility] - it is an injection every 28 days (we discussed this last visit).  Sent to [employer].  The first dose requires 2 shots, then 1 shot every 28 days thereafter.  She can come in for a headache cocktail if she has a driver.  Due to diabetes, I would like to avoid prednisone.  She should be limiting use of pain relievers (sumatriptan, naproxen, ibuprofen, acetaminophen, Excedrin, etc) to no more than 2 days out of week to prevent risk of rebound or medication-overuse headache.

## 2021-11-09 ENCOUNTER — Telehealth: Payer: Self-pay | Admitting: Neurology

## 2021-11-09 NOTE — Telephone Encounter (Signed)
Patient states that we were doing injections for headache but she is not sure the name of it. She has to go through [employer] to get it due to patient asst.  Please call patient  she said that we would need to sign something

## 2021-11-12 ENCOUNTER — Other Ambulatory Visit (HOSPITAL_COMMUNITY): Payer: Self-pay

## 2021-11-12 MED ORDER — EMGALITY 120 MG/ML ~~LOC~~ SOAJ
120.0000 mg | SUBCUTANEOUS | 5 refills | Status: DC
  Filled 2021-11-12: qty 2, fill #0
  Filled 2022-01-23: qty 1, 30d supply, fill #0

## 2021-11-12 NOTE — Telephone Encounter (Signed)
Emgality sent to [employer]

## 2021-11-12 NOTE — Telephone Encounter (Signed)
Returned patient's phone call. Patient states she was treated recently for BV and wants to know what she can do to help prevent it or what other treatment options are out there. Told patient she should avoid scented products, lotions, soaps or douching. Told her some people find probiotics or boric acid tablets in the vagina helpful. Patient states that she sometimes uses rephresh for her vaginal dryness and wants to know if that would cause the infection. Told patient that product shouldn't cause it. Patient verbalized understanding & had no other questions.

## 2021-11-14 ENCOUNTER — Other Ambulatory Visit: Payer: Self-pay

## 2021-11-14 ENCOUNTER — Other Ambulatory Visit: Payer: Self-pay | Admitting: Internal Medicine

## 2021-11-14 DIAGNOSIS — Z794 Long term (current) use of insulin: Secondary | ICD-10-CM

## 2021-11-14 DIAGNOSIS — E1165 Type 2 diabetes mellitus with hyperglycemia: Secondary | ICD-10-CM

## 2021-11-14 MED ORDER — NOVOLOG FLEXPEN 100 UNIT/ML ~~LOC~~ SOPN
PEN_INJECTOR | SUBCUTANEOUS | 0 refills | Status: DC
  Filled 2021-11-14: qty 15, 23d supply, fill #0

## 2021-11-14 NOTE — Telephone Encounter (Signed)
Requested Prescriptions  Pending Prescriptions Disp Refills  . insulin aspart (NOVOLOG FLEXPEN) 100 UNIT/ML FlexPen 15 mL 0    Sig: INJECT 25 UNITS BEFORE BREAKFAST, 15 UNITS BEFORE LUNCH, AND 25 UNITS BEFORE DINNER.     Endocrinology:  Diabetes - Insulins Failed - 11/14/2021 12:35 PM      Failed - HBA1C is between 0 and 7.9 and within 180 days    HbA1c, POC (controlled diabetic range)  Date Value Ref Range Status  11/01/2021 10.7 (A) 0.0 - 7.0 % Final         Passed - Valid encounter within last 6 months    Recent Outpatient Visits          1 week ago Type 2 diabetes mellitus with hyperglycemia, with long-term current use of insulin [facility])   [place], Annie Main L, RPH-CPP   1 month ago Type 2 diabetes mellitus with hyperglycemia, with long-term current use of insulin [facility])   [place], Annie Main L, RPH-CPP   2 months ago Type 2 diabetes mellitus with hyperglycemia, with long-term current use of insulin [facility])   [place], Annie Main L, RPH-CPP   3 months ago Type 2 diabetes mellitus with hyperglycemia, with long-term current use of insulin [employer] [facility])   [place] Karle Plumber B, MD   4 months ago Type 2 diabetes mellitus with hyperglycemia, with long-term current use of insulin [facility]   [place], RPH-CPP      Future Appointments            In 2 weeks Ladell Pier, MD [facility]   In 2 months Johney Frame, Greer Ee, MD [place], [facility]

## 2021-11-19 ENCOUNTER — Other Ambulatory Visit: Payer: Self-pay

## 2021-11-20 ENCOUNTER — Other Ambulatory Visit: Payer: Self-pay

## 2021-11-26 ENCOUNTER — Ambulatory Visit (INDEPENDENT_AMBULATORY_CARE_PROVIDER_SITE_OTHER): Payer: No Payment, Other | Admitting: Clinical

## 2021-11-26 ENCOUNTER — Other Ambulatory Visit: Payer: Self-pay

## 2021-11-26 DIAGNOSIS — F331 Major depressive disorder, recurrent, moderate: Secondary | ICD-10-CM

## 2021-11-26 NOTE — Progress Notes (Signed)
   THERAPIST PROGRESS NOTE  Session Time: 40 minutes  Participation Level: Active  Behavioral Response: CasualAlertDepressed  Type of Therapy: Individual Therapy  Treatment Goals addressed: Coping  Interventions: CBT and Supportive  Summary:  Whitney Benton is a 50 y.o. female who presents for the scheduled session oriented times five, appropriately dressed and friendly. Client denied hallucinations and delusions. Client reported she has been feeling depressed because her mother is now on hospice.  Client reported since following her mother was at [facility]. Client reported she then had her mother switched to hospice so family would be able to visit her. Client reported she has been having some anger towards her sister because she partially blames her for not helping to take proper care of their mother. Client reported whenever she was not with her mother at home, her sister was supposed to make sure their mother is taken care of. Client reported when she returned home her mother was unconscious because her sugar dropped. Client reported she was able to tell her mother she loved her the day prior and that was the last time they spoke to each other. Client reported her mother is still able to look around but not able to move. Client reported she has her boyfriend, cousins, kids, and pastor who helps check on her. Client reported she feels overwhelmed but has been mentally preparing herself over the past year for this time as her mothers health declined. Client reported on a positive note she has a new grandson who is two months old which has been encouraging her.    Suicidal/Homicidal: Nowithout intent/plan  Therapist Response:  Therapist began the appointment asking the client how she has been doing since last seen. Therapist used CBT to utilize active listening and positive emotional support towards her thoughts and feelings. Therapist used CBT to ask the client to identify how she is  coping with the stressor and who her supports are. Therapist used CBT to normalize the clients emotions. Therapist used CBT to engage with the client to discuss the stages of grief. Therapist assigned the client homework to reframe negative thoughts and focus on her positive traits as well as being accepting of support instead of overwhelmed. Client was scheduled for next appointment.     Plan: Return again in 6 weeks.  Diagnosis: MDD, recurrent episode, moderate   Whitney Jubilee Sheala Dosh, LCSW 11/26/2021

## 2021-11-27 ENCOUNTER — Telehealth (HOSPITAL_COMMUNITY): Payer: No Payment, Other | Admitting: Psychiatry

## 2021-11-27 ENCOUNTER — Other Ambulatory Visit: Payer: Self-pay

## 2021-11-28 ENCOUNTER — Telehealth: Payer: Self-pay | Admitting: Internal Medicine

## 2021-11-28 ENCOUNTER — Other Ambulatory Visit: Payer: Self-pay

## 2021-11-28 MED ORDER — TRULICITY 1.5 MG/0.5ML ~~LOC~~ SOAJ
3.0000 mg | SUBCUTANEOUS | 6 refills | Status: DC
  Filled 2021-11-28: qty 4, 28d supply, fill #0

## 2021-11-28 NOTE — Telephone Encounter (Signed)
Yes ma'am. The pharmacy dispensed these today.

## 2021-11-28 NOTE — Telephone Encounter (Signed)
I received message from a pharmacy that they do not have the 4.5 mg or the 3 mg pens in stock for the Trulicity.  This patient was placed  on the 4.5 mg on last visit with clinical pharmacist.  She requested to be placed back on the 3 mg until 4.5 mg pens become available.  We currently only have 1.5 mg pens.  Updated prescription sent to the pharmacy for her to use 2 of the 1.5 mg pens per week.

## 2021-11-29 ENCOUNTER — Telehealth: Payer: Self-pay | Admitting: Pharmacist

## 2021-11-29 NOTE — Telephone Encounter (Signed)
Dr. Wynetta Emery,   Per [facility], this patient's pharmacy that provides her Trulicity for free has stock available in the 4.5 mg weekly dose. I know she was on this dose previously before we ran into supply issues. Do you want to keep her on her current regimen or would you prefer that I send in the 4.5 mg dose?

## 2021-11-30 ENCOUNTER — Ambulatory Visit: Payer: Medicaid Other | Attending: Internal Medicine | Admitting: Internal Medicine

## 2021-11-30 ENCOUNTER — Other Ambulatory Visit: Payer: Self-pay

## 2021-11-30 VITALS — BP 112/79 | HR 103 | Ht 63.0 in | Wt 228.2 lb

## 2021-11-30 DIAGNOSIS — Z794 Long term (current) use of insulin: Secondary | ICD-10-CM

## 2021-11-30 DIAGNOSIS — Z6379 Other stressful life events affecting family and household: Secondary | ICD-10-CM

## 2021-11-30 DIAGNOSIS — E785 Hyperlipidemia, unspecified: Secondary | ICD-10-CM

## 2021-11-30 DIAGNOSIS — E1165 Type 2 diabetes mellitus with hyperglycemia: Secondary | ICD-10-CM

## 2021-11-30 DIAGNOSIS — E1169 Type 2 diabetes mellitus with other specified complication: Secondary | ICD-10-CM

## 2021-11-30 DIAGNOSIS — F5104 Psychophysiologic insomnia: Secondary | ICD-10-CM

## 2021-11-30 DIAGNOSIS — Z2821 Immunization not carried out because of patient refusal: Secondary | ICD-10-CM

## 2021-11-30 DIAGNOSIS — Z6841 Body Mass Index (BMI) 40.0 and over, adult: Secondary | ICD-10-CM

## 2021-11-30 LAB — GLUCOSE, POCT (MANUAL RESULT ENTRY): POC Glucose: 230 mg/dl — AB (ref 70–99)

## 2021-11-30 NOTE — Progress Notes (Signed)
Patient ID: Whitney Benton, female    DOB: 01-03-1971  MRN: [id number]  CC: Diabetes   Subjective: Whitney Benton is a 50 y.o. female who presents for chronic ds management Her concerns today include:  Pt with hx of depression, migraines, mixed incontinence, DM, obesity, HL, vit D def, former smoker  Mom in hospice house x 5 days.  Has good family support.  DIABETES TYPE 2 Last A1C:   Results for orders placed or performed in visit on 11/30/21  POCT glucose (manual entry)  Result Value Ref Range   POC Glucose 230 (A) 70 - 99 mg/dl    Lab Results  Component Value Date   HGBA1C 10.7 (A) 11/01/2021   Med Adherence:  since last visit with me Trulicity was increased to 4.5 mg by never got that dose as our pharm did not have it.  I was informed by clinical pharmacist yesterday that we will be getting the 4.5 mg dose shortly.  In the meantime I have her taking 2 of the 1.5 mg pens once a week. Medication side effects:  [x]  Yes  -of her insulins she most forget to take the evening dose of Novolog.  Takes Lantus in mornings Home Monitoring?  []  Yes    [x]  No - been busy with mom issue Home glucose results range: Diet Adherence: []  Yes    []  No Exercise: []  Yes    []  No Hypoglycemic episodes?: [x]  Yes not recently    Numbness of the feet? []  Yes    []  No Retinopathy hx? []  Yes    []  No Last eye exam:  Comments:   HL:  taking and tolerating Lipitor  Tachycardia:  taking the Metoprolol as prescribed by cardiology.  She did apply for [facility] discount but lacks the supporting documents needed to get her approved.  Her behav health specialist recommends that she gets a sleep study because of insomnia.  Patient thinks she snores but never told by fa amily member.  Sometimes she wakes feeling unrefresh  HM:  declines flu. Had 4 COVID vac shots.   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   DKA (diabetic ketoacidosis) ([place]) 01/23/2021   Glaucoma suspect 04/12/2020   DKA, type 2, not at goal [facility]) 10/02/2019   AKI (acute kidney injury) ([place]) 10/02/2019   Hyperkalemia 10/02/2019   Leukocytosis 10/02/2019   Abnormal LFTs 10/02/2019   Stressful life events affecting family and household 08/06/2019   New onset type 2 diabetes mellitus ([place]) 02/04/2019   Obesity (BMI 35.0-39.9 without comorbidity) 02/04/2019   Tobacco dependence 02/04/2019   Left arm weakness 12/06/2014   Paresthesias/numbness 12/06/2014   Tobacco abuse 11/14/2014   Depression    Mixed incontinence 06/27/2014   History of TVH in 2006 for fibroids and menorrhagia; benign pathology 09/08/2011     Current Outpatient Medications on File Prior to Visit  Medication Sig Dispense Refill   atorvastatin (LIPITOR) 20 MG tablet Take 1 tablet (20 mg total) by mouth daily. 30 tablet 2   Blood Glucose Monitoring Suppl (TRUE METRIX METER) w/Device KIT Check blood sugars three times a day 1 kit 0   busPIRone (BUSPAR) 15 MG tablet Take 1 tablet (15 mg total) by mouth 3 (three) times daily. 90 tablet  3   conjugated estrogens (PREMARIN) vaginal cream Apply 0.5 gram intravaginally 2 times a week 42.5 g 1   cyclobenzaprine (FLEXERIL) 10 MG tablet TAKE 1 TABLET (10 MG TOTAL) BY MOUTH DAILY AS NEEDED FOR MUSCLE SPASMS. MUST HAVE OFFICE VISIT FOR REFILLS 30 tablet 0   cycloSPORINE (RESTASIS) 0.05 % ophthalmic emulsion Apply 1 drop into both eyes twice a day 180 each 3   Dulaglutide (TRULICITY) 1.5 FM/4.0RF SOPN Inject 3 mg into the skin once a week. 4 mL 6   DULoxetine (CYMBALTA) 60 MG capsule Take 1 capsule (60 mg total) by mouth daily. 30 capsule 3   fluorometholone (FML) 0.1 % ophthalmic suspension Instill 1 drop into both eyes as directed 1 drop 4 times day for 2 weeks  then 1 drop 2 times day for 2 weeks 10 mL 0   Galcanezumab-gnlm (EMGALITY) 120 MG/ML SOAJ Inject 120 mg into the skin every 28 (twenty-eight) days. 1.12 mL 5   glucose blood (TRUE METRIX BLOOD GLUCOSE TEST) test strip Use to check blood sugar 3 times daily 100 each 2   hydrOXYzine (ATARAX/VISTARIL) 25 MG tablet TAKE 1 TABLET (25 MG TOTAL) BY MOUTH 3 (THREE) TIMES DAILY AS NEEDED. 90 tablet 3   insulin aspart (NOVOLOG FLEXPEN) 100 UNIT/ML FlexPen INJECT 25 UNITS BEFORE BREAKFAST, 15 UNITS BEFORE LUNCH, AND 25 UNITS BEFORE DINNER. 15 mL 0   Insulin Glargine (BASAGLAR KWIKPEN) 100 UNIT/ML Inject 67 Units into the skin at bedtime. 15 mL 4   Insulin Pen Needle (PEN NEEDLES) 31G X 6 MM MISC Use as directed 100 each 0   metoprolol tartrate (LOPRESSOR) 25 MG tablet Take 0.5 tablets (12.5 mg total) by mouth 2 (two) times daily. 90 tablet 2   Multiple Vitamin (MULTIVITAMIN ADULT) TABS Take 1 tablet by mouth daily.     naproxen (NAPROSYN) 500 MG tablet Take 1 tablet (500 mg total) by mouth 2 (two) times daily as needed. 20 tablet 5   nortriptyline (PAMELOR) 50 MG capsule Take 2 capsules (100 mg total) by mouth at bedtime. 60 capsule 5   ondansetron (ZOFRAN ODT) 4 MG disintegrating tablet Take 1 tablet (4 mg total) by mouth every 8 (eight) hours as needed for nausea or vomiting. 20 tablet 0   QUEtiapine (SEROQUEL) 100 MG tablet Take 1 tablet (100 mg total) by mouth at bedtime. 30 tablet 3   SUMAtriptan (IMITREX) 100 MG tablet TAKE 1 TABLET EARLIEST ONSET OF MIGRAINE. MAY REPEAT IN 2 HOURS IF HEADACHE PERSISTS OR RECURS. MAXIMUM 2 TABLETS IN 24 HOURS. 10 tablet 5   topiramate (TOPAMAX) 100 MG tablet Take 1 tablet (100 mg total) by mouth at bedtime. 30 tablet 3   TRUEPLUS LANCETS 28G MISC Check blood sugars three time a day 100 each 12   zolpidem (AMBIEN) 5 MG tablet Take 1 tablet (5 mg total) by mouth at bedtime as needed for sleep. 30 tablet 2   pantoprazole (PROTONIX) 20 MG tablet Take 2 tablets (40 mg total)  by mouth daily for 14 days. 28 tablet 0   [DISCONTINUED] cetirizine (ZYRTEC) 10 MG tablet Take 1 tablet (10 mg total) by mouth daily. (Patient not taking: No sig reported) 30 tablet 1   No current facility-administered medications on file prior to visit.    Allergies  Allergen Reactions   Aspirin Hives   Oxycodone Nausea And Vomiting    Social History   Socioeconomic History   Marital status: Significant Other    Spouse name: Not on file  Number of children: 3   Years of education: 14   Highest education level: Not on file  Occupational History   Not on file  Tobacco Use   Smoking status: Former    Packs/day: 0.25    Years: 8.00    Pack years: 2.00    Types: Cigarettes   Smokeless tobacco: Never  Vaping Use   Vaping Use: Never used  Substance and Sexual Activity   Alcohol use: Yes    Comment: occasional   Drug use: No   Sexual activity: Yes    Birth control/protection: Surgical  Other Topics Concern   Not on file  Social History Narrative   Patient lives at home with mother and father , one story    Patient has 3 children    Patient is single   Patient has 14 years of education    Patient is right handed    Social Determinants of Health   Financial Resource Strain: Not on file  Food Insecurity: Food Insecurity Present   Worried About Charity fundraiser in the Last Year: Sometimes true   Arboriculturist in the Last Year: Often true  Transportation Needs: No Transportation Needs   Lack of Transportation (Medical): No   Lack of Transportation (Non-Medical): No  Physical Activity: Not on file  Stress: Not on file  Social Connections: Not on file  Intimate Partner Violence: Not on file    Family History  Problem Relation Age of Onset   Diabetes Mother    Mental illness Mother    Depression Mother    Hypertension Mother    Hypertension Father    Diabetes Father     Past Surgical History:  Procedure Laterality Date   CESAREAN SECTION     VAGINAL  HYSTERECTOMY  2006   Fibroids, menorrhagia, benign pathology    ROS: Review of Systems Negative except as stated above  PHYSICAL EXAM: BP 112/79   Pulse (!) 103   Ht 5' 3"  (1.6 m)   Wt 228 lb 3.2 oz (103.5 kg)   SpO2 96%   BMI 40.42 kg/m   Wt Readings from Last 3 Encounters:  11/30/21 228 lb 3.2 oz (103.5 kg)  10/30/21 223 lb 8 oz (101.4 kg)  08/16/21 226 lb 3.2 oz (102.6 kg)    Physical Exam  General appearance - alert, well appearing, and in no distress Mental status - normal mood, behavior, speech, dress, motor activity, and thought processes Neck - supple, no significant adenopathy Chest - clear to auscultation, no wheezes, rales or rhonchi, symmetric air entry Heart - normal rate, regular rhythm, normal S1, S2, no murmurs, rubs, clicks or gallops Extremities - peripheral pulses normal, no pedal edema, no clubbing or cyanosis  Depression screen [facility] 2/9 11/30/2021 07/31/2021 04/30/2021  Decreased Interest 3 3 2   Down, Depressed, Hopeless 3 3 2   PHQ - 2 Score 6 6 4   Altered sleeping 3 3 3   Tired, decreased energy 3 3 3   Change in appetite 3 3 3   Feeling bad or failure about yourself  3 3 3   Trouble concentrating 3 3 3   Moving slowly or fidgety/restless 0 0 0  Suicidal thoughts 0 0 0  PHQ-9 Score 21 21 19   Some encounter information is confidential and restricted. Go to Review Flowsheets activity to see all data.  Some recent data might be hidden     CMP Latest Ref Rng & Units 04/30/2021 01/26/2021 01/26/2021  Glucose 65 - 99 mg/dL 186(H)  236(H) 133(H)  BUN 6 - 24 mg/dL 10 <5(L) <5(L)  Creatinine 0.57 - 1.00 mg/dL 0.85 0.69 0.63  Sodium 134 - 144 mmol/L 142 137 140  Potassium 3.5 - 5.2 mmol/L 3.9 3.3(L) 3.1(L)  Chloride 96 - 106 mmol/L 100 103 108  CO2 20 - 29 mmol/L 25 23 24   Calcium 8.7 - 10.2 mg/dL 9.3 8.7(L) 8.3(L)  Total Protein 6.5 - 8.1 g/dL - - -  Total Bilirubin 0.3 - 1.2 mg/dL - - -  Alkaline Phos 38 - 126 U/L - - -  AST 15 - 41 U/L - - -  ALT 0 - 44 U/L -  - -   Lipid Panel     Component Value Date/Time   CHOL 159 02/04/2020 0955   TRIG 117 02/04/2020 0955   HDL 46 02/04/2020 0955   CHOLHDL 3.5 02/04/2020 0955   CHOLHDL 3.2 11/14/2014 0707   VLDL 19 11/14/2014 0707   LDLCALC 92 02/04/2020 0955    CBC    Component Value Date/Time   WBC 9.9 01/25/2021 0334   RBC 4.26 01/25/2021 0334   HGB 12.5 01/25/2021 0334   HGB 13.6 07/14/2019 1058   HCT 36.2 01/25/2021 0334   HCT 40.5 07/14/2019 1058   PLT 303 01/25/2021 0334   PLT 343 07/14/2019 1058   MCV 85.0 01/25/2021 0334   MCV 85 07/14/2019 1058   MCH 29.3 01/25/2021 0334   MCHC 34.5 01/25/2021 0334   RDW 14.0 01/25/2021 0334   RDW 13.1 07/14/2019 1058   LYMPHSABS 1.5 01/23/2021 1052   LYMPHSABS 2.4 07/14/2019 1058   MONOABS 0.7 01/23/2021 1052   EOSABS 0.0 01/23/2021 1052   EOSABS 0.1 07/14/2019 1058   BASOSABS 0.0 01/23/2021 1052   BASOSABS 0.0 07/14/2019 1058    ASSESSMENT AND PLAN:  1. Type 2 diabetes mellitus with hyperglycemia, with long-term current use of insulin (HCC) Patient want to continue current dose of Lantus 67 units daily, NovoLog 03/47/42 and Trulicity 3 mg until we get the 4.5 mg in.  I have not made any changes to her regimen as she does not have any blood sugar log with her today.  Encouraged her to check blood sugars at least twice a week and bring them in with her to see the clinical pharmacist in several weeks.  Strongly encouraged her and healthy eating habits and importance of trying to get in some form of moderate intensity exercise several days a week. - POCT glucose (manual entry) - CBC - Lipid panel  2. Morbid obesity ([place]) #1 above.  3. Hyperlipidemia associated with type 2 diabetes mellitus (HCC) Continue atorvastatin. - Lipid panel - Hepatic function panel  4. Stressful life events affecting family and household I sympathize with which she is going through with her mother who is now in hospice care.  Patient is plugged in with mental  health services.  5. Chronic insomnia Advised patient that we can refer for sleep study but it would be costly out-of-pocket.  We will hold off for now as she is uninsured  6. Influenza vaccination declined Commended.  Patient declined.    Patient was given the opportunity to ask questions.  Patient verbalized understanding of the plan and was able to repeat key elements of the plan.   Orders Placed This Encounter  Procedures   POCT glucose (manual entry)     Requested Prescriptions    No prescriptions requested or ordered in this encounter    No follow-ups on file.  Neoma Laming  Wynetta Emery, MD, [facility]

## 2021-12-01 ENCOUNTER — Other Ambulatory Visit: Payer: Self-pay | Admitting: Internal Medicine

## 2021-12-01 LAB — HEPATIC FUNCTION PANEL
ALT: 16 IU/L (ref 0–32)
AST: 20 IU/L (ref 0–40)
Albumin: 4.4 g/dL (ref 3.8–4.9)
Alkaline Phosphatase: 141 IU/L — ABNORMAL HIGH (ref 44–121)
Bilirubin Total: 0.3 mg/dL (ref 0.0–1.2)
Bilirubin, Direct: <0.1 mg/dL (ref 0.00–0.40)
Total Protein: 7 g/dL (ref 6.0–8.5)

## 2021-12-01 LAB — LIPID PANEL
Chol/HDL Ratio: 4.4 ratio (ref 0.0–4.4)
Cholesterol, Total: 177 mg/dL (ref 100–199)
HDL: 40 mg/dL (ref >39)
LDL Chol Calc (NIH): 105 mg/dL — ABNORMAL HIGH (ref 0–99)
Triglycerides: 181 mg/dL — ABNORMAL HIGH (ref 0–149)
VLDL Cholesterol Cal: 32 mg/dL (ref 5–40)

## 2021-12-01 LAB — CBC
Hematocrit: 40.9 % (ref 34.0–46.6)
Hemoglobin: 13.5 g/dL (ref 11.1–15.9)
MCH: 28 pg (ref 26.6–33.0)
MCHC: 33 g/dL (ref 31.5–35.7)
MCV: 85 fL (ref 79–97)
Platelets: 372 10*3/uL (ref 150–450)
RBC: 4.83 x10E6/uL (ref 3.77–5.28)
RDW: 13.8 % (ref 11.7–15.4)
WBC: 6.1 10*3/uL (ref 3.4–10.8)

## 2021-12-01 MED ORDER — ATORVASTATIN CALCIUM 20 MG PO TABS
20.0000 mg | ORAL_TABLET | Freq: Every day | ORAL | 2 refills | Status: DC
  Filled 2021-12-01 – 2022-01-11 (×4): qty 30, 30d supply, fill #0

## 2021-12-01 NOTE — Progress Notes (Signed)
Blood cell counts are normal. Cholesterol level elevated at 105 with goal being less than 70.  Take the atorvastatin as prescribed.  Refill sent to the pharmacy.  Mild elevation in one of her liver function test but improved from last year.  We will continue to monitor.

## 2021-12-03 ENCOUNTER — Telehealth: Payer: Self-pay

## 2021-12-03 ENCOUNTER — Other Ambulatory Visit: Payer: Self-pay

## 2021-12-03 NOTE — Telephone Encounter (Signed)
Contacted pt to go over lab results pt is aware and doesn't have any questions or concerns 

## 2021-12-06 ENCOUNTER — Other Ambulatory Visit: Payer: Self-pay

## 2021-12-06 ENCOUNTER — Other Ambulatory Visit: Payer: Self-pay | Admitting: Pharmacist

## 2021-12-06 MED ORDER — TRULICITY 3 MG/0.5ML ~~LOC~~ SOAJ
3.0000 mg | SUBCUTANEOUS | 3 refills | Status: DC
  Filled 2021-12-06: qty 2, 28d supply, fill #0
  Filled 2021-12-21: qty 8, 112d supply, fill #0

## 2021-12-10 ENCOUNTER — Other Ambulatory Visit: Payer: Self-pay

## 2021-12-12 ENCOUNTER — Other Ambulatory Visit: Payer: Self-pay

## 2021-12-19 ENCOUNTER — Other Ambulatory Visit: Payer: Self-pay

## 2021-12-21 ENCOUNTER — Other Ambulatory Visit: Payer: Self-pay

## 2021-12-24 ENCOUNTER — Other Ambulatory Visit: Payer: Self-pay | Admitting: Internal Medicine

## 2021-12-24 ENCOUNTER — Other Ambulatory Visit: Payer: Self-pay

## 2021-12-24 DIAGNOSIS — E1165 Type 2 diabetes mellitus with hyperglycemia: Secondary | ICD-10-CM

## 2021-12-24 DIAGNOSIS — Z794 Long term (current) use of insulin: Secondary | ICD-10-CM

## 2021-12-24 MED ORDER — BASAGLAR KWIKPEN 100 UNIT/ML ~~LOC~~ SOPN
67.0000 [IU] | PEN_INJECTOR | Freq: Every day | SUBCUTANEOUS | 2 refills | Status: DC
  Filled 2021-12-24: qty 45, 67d supply, fill #0
  Filled 2021-12-31: qty 18, 26d supply, fill #0
  Filled 2022-01-23: qty 18, 26d supply, fill #1
  Filled 2022-02-25: qty 18, 26d supply, fill #2

## 2021-12-24 MED ORDER — NOVOLOG FLEXPEN 100 UNIT/ML ~~LOC~~ SOPN
PEN_INJECTOR | SUBCUTANEOUS | 2 refills | Status: DC
  Filled 2021-12-24: qty 18, 28d supply, fill #0
  Filled 2021-12-31: qty 18, 27d supply, fill #0
  Filled 2022-01-23: qty 18, 27d supply, fill #1
  Filled 2022-02-25: qty 18, 27d supply, fill #2

## 2021-12-25 ENCOUNTER — Other Ambulatory Visit: Payer: Self-pay

## 2021-12-28 ENCOUNTER — Other Ambulatory Visit: Payer: Self-pay

## 2021-12-28 ENCOUNTER — Ambulatory Visit: Payer: Self-pay | Attending: Family Medicine | Admitting: Pharmacist

## 2021-12-28 DIAGNOSIS — E1165 Type 2 diabetes mellitus with hyperglycemia: Secondary | ICD-10-CM

## 2021-12-28 DIAGNOSIS — Z794 Long term (current) use of insulin: Secondary | ICD-10-CM

## 2021-12-28 NOTE — Progress Notes (Signed)
° ° °  S:    PCP: Karle Plumber, MD  Patient arrives to the clinic for a follow-up diabetes evaluation and management. Patient was last seen by PCP on 11/30/2021. She is currently taking Trulicity, Engineer, agricultural, and [employer]. She reports her mother passed away on 12/25/21. Understandably, she reports not checking her CBGs d/t this but remains compliant with her medication.   FH: DM, HTN, MDD in mother; DM, HTN in father SH: occasionally drinks, former 0.25 PPD smoker for 8 years; became homeless due to financial strain, living with critically ill mother and has 3 children   Human resources officer affordability: on medicaid  Medication adherence reported.   Current diabetes medications include: Trulicity 3mg  weekly, Basaglar 67 units at bedtime, Novolog 25, 15, and 25 units Current hypertension medications include: Metoprolol 12.5mg  BID,  Current hyperlipidemia medications include: Atorvastatin 20mg  daily  Patient denies hypoglycemic events.  Patient reported dietary habits: reports eating and snacking more than usual  Patient reports nocturia (nighttime urination) but contributes it to increased intake of fluids.  Patient reports neuropathy (nerve pain) in her hands; no change from last visit. Patient denies visual changes. Patient reports self foot exams.     O:  Lab Results  Component Value Date   HGBA1C 10.7 (A) 11/01/2021   Lipid Panel     Component Value Date/Time   CHOL 177 11/30/2021 1039   TRIG 181 (H) 11/30/2021 1039   HDL 40 11/30/2021 1039   CHOLHDL 4.4 11/30/2021 1039   CHOLHDL 3.2 11/14/2014 0707   VLDL 19 11/14/2014 0707   LDLCALC 105 (H) 11/30/2021 1039   Home fasting blood sugars: not using meter  2 hour post-meal/random blood sugars: not using meter  Clinical Atherosclerotic Cardiovascular Disease (ASCVD): No  The 10-year ASCVD risk score (Arnett DK, et al., 2019) is: 8.1%   Values used to calculate the score:     Age: 51 years     Sex: Female      Is Non-Hispanic African American: Yes     Diabetic: Yes     Tobacco smoker: Yes     Systolic Blood Pressure: 867 mmHg     Is BP treated: No     HDL Cholesterol: 40 mg/dL     Total Cholesterol: 177 mg/dL   A/P: Diabetes longstanding currently uncontrolled. Patient is able to verbalize appropriate hypoglycemia management plan. Medication adherence appears to be improving but she is still not checking CBGs at home. Control is suboptimal due to life stressors. -Continued current regimen. She tells me she will start taking home CBGs and return for reassessment next month.  -Counseled on s/sx of and management of hypoglycemia -Next A1C anticipated 02/01/22  Benard Halsted, PharmD, [place], [place] [phone]

## 2021-12-31 ENCOUNTER — Other Ambulatory Visit: Payer: Self-pay

## 2021-12-31 ENCOUNTER — Telehealth: Payer: Self-pay | Admitting: *Deleted

## 2021-12-31 MED ORDER — FLUCONAZOLE 150 MG PO TABS
150.0000 mg | ORAL_TABLET | Freq: Once | ORAL | 0 refills | Status: AC
  Filled 2021-12-31: qty 1, 1d supply, fill #0

## 2021-12-31 MED ORDER — METRONIDAZOLE 0.75 % VA GEL
1.0000 | Freq: Every day | VAGINAL | 0 refills | Status: AC
  Filled 2021-12-31: qty 50, 5d supply, fill #0

## 2021-12-31 NOTE — Addendum Note (Signed)
Addended by: Langston Reusing on: 12/31/2021 05:32 PM   Modules accepted: Orders

## 2021-12-31 NOTE — Telephone Encounter (Addendum)
Pt left VM message stating that she is having sx of bacteria and would like a prescription sent in for the medication that starts with "M". Per chart review, pt was prescribed Metrogel on 10/31/21. I called pt and she did not answer.  Message was left stating that I have some questions for her before I can proceed with her request.   1355  Pt left a second message stating she is having sx of bacteria and requested prescription to be called to her pharmacy. I called pt in order to ascertain her exact sx and was unable to leave message.   [facility] w/pt and she stated that she has all the same sx as before in November - grey vaginal discharge and itching. Per chart review, pt had BV and yeast on wet prep @ that time. I advised pt that I will send in the Metrogel and Diflucan.  She should wait to take the Diflucan after she is finished with Metrogel Rx. Pt voiced understanding.

## 2022-01-01 ENCOUNTER — Other Ambulatory Visit: Payer: Self-pay

## 2022-01-11 ENCOUNTER — Other Ambulatory Visit: Payer: Self-pay

## 2022-01-12 NOTE — Progress Notes (Signed)
Cardiology Office Note:    Date:  01/17/2022   ID:  Whitney Benton, DOB 03-23-1971, MRN [id number]  PCP:  Ladell Pier, MD   [facility] Providers Cardiologist:  None {  Referring MD: Ladell Pier, MD    History of Present Illness:    Whitney Benton is a 51 y.o. female with a hx of DMII, HLD, obesity, former smoker and depression who returns to clinic for follow-up.  Was initially seen in clinic on 07/16/2021 for sinus tachycardia associated with mild SOB and DOE. Unfortunately, she did not have insurance and therefore we did not obtain monitor or echo at that time. We started her on metoprolol 12.36m BID. We recommended her for the orange card as well.  Today, the patient states she is only doing okay. She lost her mom last month and this has been very difficult for her. She states she continues to have intermittent chest discomfort that occurs randomly. Pain last about 1 hour before going away. Nothing makes it worse or better. Symptoms are not exertional. No associated SOB, lightheadedness, dizziness or syncope. She also continues to have dyspnea on exertion that is stable since prior visit. Palpitations improved since starting the metoprolol.   Notably, the patient states she will have insurance next month so would like to defer testing until that time.  Family history: mother with HTN  Past Medical History:  Diagnosis Date   Anxiety    Depression    Diabetes mellitus without complication ([place]    Trichomonas infection     Past Surgical History:  Procedure Laterality Date   CESAREAN SECTION     VAGINAL HYSTERECTOMY  2006   Fibroids, menorrhagia, benign pathology    Current Medications: Current Meds  Medication Sig   atorvastatin (LIPITOR) 40 MG tablet Take 1 tablet (40 mg total) by mouth daily.   Blood Glucose Monitoring Suppl (TRUE METRIX METER) w/Device KIT Check blood sugars three times a day   busPIRone (BUSPAR) 15 MG tablet Take 1 tablet (15 mg  total) by mouth 3 (three) times daily.   conjugated estrogens (PREMARIN) vaginal cream Apply 0.5 gram intravaginally 2 times a week   cyclobenzaprine (FLEXERIL) 10 MG tablet TAKE 1 TABLET (10 MG TOTAL) BY MOUTH DAILY AS NEEDED FOR MUSCLE SPASMS. MUST HAVE OFFICE VISIT FOR REFILLS   cycloSPORINE (RESTASIS) 0.05 % ophthalmic emulsion Apply 1 drop into both eyes twice a day   Dulaglutide (TRULICITY) 3 MYI/9.4WNSOPN Inject 3 mg as directed once a week.   DULoxetine HCl 40 MG CPEP Take 80 mg (2 capsules) by mouth daily.   fluorometholone (FML) 0.1 % ophthalmic suspension Instill 1 drop into both eyes as directed 1 drop 4 times day for 2 weeks then 1 drop 2 times day for 2 weeks   Galcanezumab-gnlm (EMGALITY) 120 MG/ML SOAJ Inject 120 mg into the skin every 28 (twenty-eight) days.   glucose blood (TRUE METRIX BLOOD GLUCOSE TEST) test strip Use to check blood sugar 3 times daily   hydrOXYzine (ATARAX) 25 MG tablet TAKE 1 TABLET (25 MG TOTAL) BY MOUTH 3 (THREE) TIMES DAILY AS NEEDED.   insulin aspart (NOVOLOG FLEXPEN) 100 UNIT/ML FlexPen INJECT 25 UNITS BEFORE BREAKFAST, 15 UNITS BEFORE LUNCH, AND 25 UNITS BEFORE DINNER.   Insulin Glargine (BASAGLAR KWIKPEN) 100 UNIT/ML Inject 67 Units into the skin at bedtime.   Insulin Pen Needle (PEN NEEDLES) 31G X 6 MM MISC Use as directed   metoprolol tartrate (LOPRESSOR) 25 MG tablet Take  0.5 tablets (12.5 mg total) by mouth 2 (two) times daily.   Multiple Vitamin (MULTIVITAMIN ADULT) TABS Take 1 tablet by mouth daily.   naproxen (NAPROSYN) 500 MG tablet Take 1 tablet (500 mg total) by mouth 2 (two) times daily as needed.   ondansetron (ZOFRAN ODT) 4 MG disintegrating tablet Take 1 tablet (4 mg total) by mouth every 8 (eight) hours as needed for nausea or vomiting.   QUEtiapine (SEROQUEL) 100 MG tablet Take 1 tablet (100 mg total) by mouth at bedtime.   SUMAtriptan (IMITREX) 100 MG tablet TAKE 1 TABLET EARLIEST ONSET OF MIGRAINE. MAY REPEAT IN 2 HOURS IF HEADACHE  PERSISTS OR RECURS. MAXIMUM 2 TABLETS IN 24 HOURS.   TRUEPLUS LANCETS 28G MISC Check blood sugars three time a day   zolpidem (AMBIEN) 5 MG tablet Take 1 tablet (5 mg total) by mouth at bedtime as needed for sleep.   [DISCONTINUED] atorvastatin (LIPITOR) 20 MG tablet Take 1 tablet (20 mg total) by mouth daily.     Allergies:   Aspirin and Oxycodone   Social History   Socioeconomic History   Marital status: Significant Other    Spouse name: Not on file   Number of children: 3   Years of education: 14   Highest education level: Not on file  Occupational History   Not on file  Tobacco Use   Smoking status: Former    Packs/day: 0.25    Years: 8.00    Pack years: 2.00    Types: Cigarettes   Smokeless tobacco: Never  Vaping Use   Vaping Use: Never used  Substance and Sexual Activity   Alcohol use: Yes    Comment: occasional   Drug use: No   Sexual activity: Yes    Birth control/protection: Surgical  Other Topics Concern   Not on file  Social History Narrative   Patient lives at home with mother and father , one story    Patient has 3 children    Patient is single   Patient has 14 years of education    Patient is right handed    Social Determinants of Radio broadcast assistant Strain: Not on file  Food Insecurity: Food Insecurity Present   Worried About Charity fundraiser in the Last Year: Sometimes true   Arboriculturist in the Last Year: Often true  Transportation Needs: No Transportation Needs   Lack of Transportation (Medical): No   Lack of Transportation (Non-Medical): No  Physical Activity: Not on file  Stress: Not on file  Social Connections: Not on file     Family History: The patient's family history includes Depression in her mother; Diabetes in her father and mother; Hypertension in her father and mother; Mental illness in her mother.  ROS:   Please see the history of present illness.    Review of Systems  Constitutional:  Positive for  malaise/fatigue. Negative for chills and fever.  HENT:  Negative for sore throat.   Eyes:  Negative for blurred vision.  Respiratory:  Positive for shortness of breath.   Cardiovascular:  Positive for chest pain and palpitations. Negative for orthopnea, claudication, leg swelling and PND.  Gastrointestinal:  Negative for blood in stool.  Genitourinary:  Negative for hematuria.  Musculoskeletal:  Negative for falls.  Neurological:  Negative for dizziness and loss of consciousness.  Psychiatric/Behavioral:  Positive for depression. Negative for substance abuse.     EKGs/Labs/Other Studies Reviewed:    The following studies were reviewed  today: CTA 07/2020:  FINDINGS: CTA CHEST FINDINGS   Cardiovascular: This is a technically adequate evaluation of the pulmonary vasculature. There are no filling defects or pulmonary emboli.   The heart is unremarkable without pericardial effusion. Normal caliber of the thoracic aorta.   Mediastinum/Nodes: No enlarged mediastinal, hilar, or axillary lymph nodes. Thyroid gland, trachea, and esophagus demonstrate no significant findings.   Lungs/Pleura: No airspace disease, effusion, or pneumothorax. The central airways are patent.   Musculoskeletal: No acute or destructive bony lesions. Reconstructed images demonstrate no additional findings.   Review of the MIP images confirms the above findings.   CT ABDOMEN and PELVIS FINDINGS   Hepatobiliary: No focal liver abnormality is seen. No gallstones, gallbladder wall thickening, or biliary dilatation.   Pancreas: Unremarkable. No pancreatic ductal dilatation or surrounding inflammatory changes.   Spleen: Normal in size without focal abnormality.   Adrenals/Urinary Tract: Adrenal glands are unremarkable. Kidneys are normal, without renal calculi, focal lesion, or hydronephrosis. Bladder is unremarkable.   Stomach/Bowel: No bowel obstruction or ileus. Normal appendix right lower quadrant. No  bowel wall thickening or inflammatory change.   Vascular/Lymphatic: No significant vascular findings are present. No enlarged abdominal or pelvic lymph nodes.   Reproductive: Status post hysterectomy. No adnexal masses.   Other: No free fluid or free gas. No abdominal wall hernia.   Musculoskeletal: No acute or destructive bony lesions. Reconstructed images demonstrate no additional findings.   Review of the MIP images confirms the above findings.   IMPRESSION: 1. Normal CTA of the chest. No evidence of pulmonary emboli. 2. No acute intra-abdominal or intrapelvic process.  EKG:  No new ECG today  Recent Labs: 04/30/2021: BUN 10; Creatinine, Ser 0.85; Potassium 3.9; Sodium 142; TSH 2.460 11/30/2021: ALT 16; Hemoglobin 13.5; Platelets 372  Recent Lipid Panel    Component Value Date/Time   CHOL 177 11/30/2021 1039   TRIG 181 (H) 11/30/2021 1039   HDL 40 11/30/2021 1039   CHOLHDL 4.4 11/30/2021 1039   CHOLHDL 3.2 11/14/2014 0707   VLDL 19 11/14/2014 0707   LDLCALC 105 (H) 11/30/2021 1039         Physical Exam:    VS:  BP 122/80    Pulse (!) 112    Ht 5' 3" (1.6 m)    Wt 229 lb 6.4 oz (104.1 kg)    SpO2 97%    BMI 40.64 kg/m     Wt Readings from Last 3 Encounters:  01/17/22 229 lb 6.4 oz (104.1 kg)  11/30/21 228 lb 3.2 oz (103.5 kg)  10/30/21 223 lb 8 oz (101.4 kg)     GEN:  Comfortable, NAD HEENT: Normal NECK: No JVD; No carotid bruits CARDIAC: RRR, no murmurs, rubs, gallops RESPIRATORY:  CTAB, no wheezes ABDOMEN: Soft, non-tender, non-distended MUSCULOSKELETAL:  No edema; No deformity  SKIN: Warm and dry NEUROLOGIC:  Alert and oriented x 3 PSYCHIATRIC:  Normal affect   ASSESSMENT:    1. Chest pain of uncertain etiology   2. Mixed hyperlipidemia   3. Type 2 diabetes mellitus with hyperglycemia, with long-term current use of insulin ([place])   4. Tachycardia     PLAN:    In order of problems listed above:  #Chest Pain with Exertion: Patient with  intermittent episodes of exertional chest heaviness and SOB. CTA chest in 2021 fortunately without significant coronary calcification. Has risk factors for CVD including obesity, DMII and HLD, however, patient is uninsured at this time. States she will have insurance next month and will  plan for CTA and TTE at that time. Discussed ER precautions for chest pain as well. -Will obtain TTE/ischemic testing once patient has insurance next month as cost is prohibitive at this time -Continue metop 12.74m BID -Increase lipitor to 443mdaily -Discussed importance of DM control   #Tachycardia: Patient has several year history of elevated HR that have been detected on multiple ER visits. Unable to perform monitor on last visit due to cost as patient does not have insurance. Was started on metop which she states has helped. Did not take metop this AM. Denies palpitations. Will continue to monitor and can plan for zio if persists at next visit. -Continue metop 12.39m70mID  -Can consider monitor if persistent symptoms once she is on insurance  #HLD: -Increase lipitor 26m76mily -Lipids per PCP  #Poorly controlled DMII on insulin: Managed by PCP. A1C 10 -Continue insulin -Continue trulicity     Medication Adjustments/Labs and Tests Ordered: Current medicines are reviewed at length with the patient today.  Concerns regarding medicines are outlined above.  No orders of the defined types were placed in this encounter.   Meds ordered this encounter  Medications   atorvastatin (LIPITOR) 40 MG tablet    Sig: Take 1 tablet (40 mg total) by mouth daily.    Dispense:  90 tablet    Refill:  2    Dose increase    Patient Instructions  Medication Instructions:   INCREASE YOUR ATORVASTATIN (LIPITOR) 40 MG BY MOUTH DAILY  *If you need a refill on your cardiac medications before your next appointment, please call your pharmacy*   Follow-Up: [facility] and your health needs are our priority.   As part of our continuing mission to provide you with exceptional heart care, we have created designated Provider Care Teams.  These Care Teams include your primary Cardiologist (physician) and Advanced Practice Providers (APPs -  Physician Assistants and Nurse Practitioners) who all work together to provide you with the care you need, when you need it.  We recommend signing up for the patient portal called "MyChart".  Sign up information is provided on this After Visit Summary.  MyChart is used to connect with patients for Virtual Visits (Telemedicine).  Patients are able to view lab/test results, encounter notes, upcoming appointments, etc.  Non-urgent messages can be sent to your provider as well.   To learn more about what you can do with MyChart, go to [email or web address] Your next appointment:   6 month(s)  The format for your next appointment:   In Person  Provider:   DR. PEMBJohney FrameOther Instructions  CALL OUR OFFICE WH[facility]inWE CAN ORDER FOR YOU TO GET A CORONARY CT DONE '    Signed, HeatFreada Bergeron  01/17/2022 10:32 AM    [facility]

## 2022-01-16 ENCOUNTER — Other Ambulatory Visit: Payer: Self-pay

## 2022-01-16 ENCOUNTER — Encounter (HOSPITAL_COMMUNITY): Payer: Self-pay | Admitting: Psychiatry

## 2022-01-16 ENCOUNTER — Telehealth (INDEPENDENT_AMBULATORY_CARE_PROVIDER_SITE_OTHER): Payer: No Payment, Other | Admitting: Psychiatry

## 2022-01-16 DIAGNOSIS — F411 Generalized anxiety disorder: Secondary | ICD-10-CM

## 2022-01-16 DIAGNOSIS — F5101 Primary insomnia: Secondary | ICD-10-CM

## 2022-01-16 DIAGNOSIS — F1994 Other psychoactive substance use, unspecified with psychoactive substance-induced mood disorder: Secondary | ICD-10-CM | POA: Diagnosis not present

## 2022-01-16 MED ORDER — BUSPIRONE HCL 15 MG PO TABS
15.0000 mg | ORAL_TABLET | Freq: Three times a day (TID) | ORAL | 3 refills | Status: DC
Start: 2022-01-16 — End: 2022-04-01
  Filled 2022-01-16: qty 90, 30d supply, fill #0

## 2022-01-16 MED ORDER — ZOLPIDEM TARTRATE 5 MG PO TABS: 5.0000 mg | ORAL_TABLET | Freq: Every evening | ORAL | 2 refills | Status: DC | PRN

## 2022-01-16 MED ORDER — DULOXETINE HCL 40 MG PO CPEP
80.0000 mg | ORAL_CAPSULE | Freq: Every day | ORAL | 3 refills | Status: DC
  Filled 2022-01-16 – 2022-01-29 (×2): qty 60, 30d supply, fill #0
  Filled 2022-02-28: qty 60, 30d supply, fill #1

## 2022-01-16 MED ORDER — HYDROXYZINE HCL 25 MG PO TABS
ORAL_TABLET | Freq: Three times a day (TID) | ORAL | 3 refills | Status: DC | PRN
  Filled 2022-01-16: qty 90, fill #0

## 2022-01-16 MED ORDER — QUETIAPINE FUMARATE 100 MG PO TABS
100.0000 mg | ORAL_TABLET | Freq: Every day | ORAL | 3 refills | Status: DC
  Filled 2022-01-16: qty 30, 30d supply, fill #0

## 2022-01-16 NOTE — Progress Notes (Signed)
BH MD/PA/NP OP Progress Note Virtual Visit via Video Note  I connected with Whitney Benton on 01/16/22 at  2:00 PM EST by a video enabled telemedicine application and verified that I am speaking with the correct person using two identifiers.  Location: Patient: Home Provider: Clinic   I discussed the limitations of evaluation and management by telemedicine and the availability of in person appointments. The patient expressed understanding and agreed to proceed.  I provided 30 minutes of non-face-to-face time during this encounter.    01/16/2022 2:02 PM Whitney Benton  MRN:  [id number]  Chief Complaint: "My mother died on 2024/01/06"  HPI: 51 year old female seen today for follow up psychiatric evaluation.  She has a psychiatric history of anxiety and depression.  She is currently managed on Seroquel 100 mg nightly, hydroxyzine 50 mg nightly, hydroxyzine 25 mg 3 times daily, Ambien 5 mg nightly, BuSpar 15 mg 3 times daily and Cymbalta 60 mg daily.  She was taking Topamax which was prescribed by her PCP however notes that she discontinued it.  She notes her medications are somewhat effective in managing her psychiatric condition.  Today patient was well-groomed, pleasant, cooperative, engaged in conversation, maintained eye.  She informed Probation officer that she has been feeling down recently because her mother died on 2024-01-06.  She informed Probation officer that she now lives in her mother's home with her sister and reports that at times things are stressful at home.  Patient notes that she has been isolating and endorses symptoms of anhedonia, fatigue, and poor concentration.  Today provider conducted a PHQ-9 and patient scored a 23, at her last visit she scored a 19.  Provider also conducted a GAD-7 and patient scored a 19, at her last visit she scored a 19.  She notes that her sleep has improved since her last visit noting that she sleeps at least 6 hours nightly.  Provider ordered a sleep study at patient's  last visit however she reports that she has not had it done.  She does note that she will have it done to rule out other causes for intermittent insomnia.  Today she denies SI/HI/AVH, mania, paranoia.   Patient notes that she continues to have pain in her hands.  She notes that [facility] to she is waiting on a referral to a different clinic for further evaluation.  Today patient agreeable to increasing Cymbalta 60 mg to 80 mg to manage anxiety and depression.  She will follow-up with outpatient counseling for therapy regarding grief.  She will continue her other medications as prescribed.  No other concerns at this time.       . Visit Diagnosis:    ICD-10-CM   1. Substance induced mood disorder (HCC)  F19.94 DULoxetine 40 MG CPEP    zolpidem (AMBIEN) 5 MG tablet    QUEtiapine (SEROQUEL) 100 MG tablet    2. Generalized anxiety disorder  F41.1 DULoxetine 40 MG CPEP    QUEtiapine (SEROQUEL) 100 MG tablet    hydrOXYzine (ATARAX) 25 MG tablet    3. Primary insomnia  F51.01 zolpidem (AMBIEN) 5 MG tablet      Past Psychiatric History: anxiety and depression  Past Medical History:  Past Medical History:  Diagnosis Date   Anxiety    Depression    Diabetes mellitus without complication ([place])    Trichomonas infection     Past Surgical History:  Procedure Laterality Date   CESAREAN SECTION  VAGINAL HYSTERECTOMY  2006   Fibroids, menorrhagia, benign pathology    Family Psychiatric History: Mother schizophrenia and bipolar disorder  Family History:  Family History  Problem Relation Age of Onset   Diabetes Mother    Mental illness Mother    Depression Mother    Hypertension Mother    Hypertension Father    Diabetes Father     Social History:  Social History   Socioeconomic History   Marital status: Significant Other    Spouse name: Not on file   Number of children: 3   Years of education: 14   Highest  education level: Not on file  Occupational History   Not on file  Tobacco Use   Smoking status: Former    Packs/day: 0.25    Years: 8.00    Pack years: 2.00    Types: Cigarettes   Smokeless tobacco: Never  Vaping Use   Vaping Use: Never used  Substance and Sexual Activity   Alcohol use: Yes    Comment: occasional   Drug use: No   Sexual activity: Yes    Birth control/protection: Surgical  Other Topics Concern   Not on file  Social History Narrative   Patient lives at home with mother and father , one story    Patient has 3 children    Patient is single   Patient has 14 years of education    Patient is right handed    Social Determinants of Radio broadcast assistant Strain: Not on file  Food Insecurity: Food Insecurity Present   Worried About Charity fundraiser in the Last Year: Sometimes true   Arboriculturist in the Last Year: Often true  Transportation Needs: No Transportation Needs   Lack of Transportation (Medical): No   Lack of Transportation (Non-Medical): No  Physical Activity: Not on file  Stress: Not on file  Social Connections: Not on file    Allergies:  Allergies  Allergen Reactions   Aspirin Hives   Oxycodone Nausea And Vomiting    Metabolic Disorder Labs: Lab Results  Component Value Date   HGBA1C 10.7 (A) 11/01/2021   MPG 380.93 10/02/2019   MPG 289.09 01/06/2019   No results found for: PROLACTIN Lab Results  Component Value Date   CHOL 177 11/30/2021   TRIG 181 (H) 11/30/2021   HDL 40 11/30/2021   CHOLHDL 4.4 11/30/2021   VLDL 19 11/14/2014   LDLCALC 105 (H) 11/30/2021   LDLCALC 92 02/04/2020   Lab Results  Component Value Date   TSH 2.460 04/30/2021   TSH 0.014 (L) 10/02/2019    Therapeutic Level Labs: No results found for: LITHIUM No results found for: VALPROATE No components found for:  CBMZ  Current Medications: Current Outpatient Medications  Medication Sig Dispense Refill   atorvastatin (LIPITOR) 20 MG tablet Take  1 tablet (20 mg total) by mouth daily. 90 tablet 2   Blood Glucose Monitoring Suppl (TRUE METRIX METER) w/Device KIT Check blood sugars three times a day 1 kit 0   busPIRone (BUSPAR) 15 MG tablet Take 1 tablet (15 mg total) by mouth 3 (three) times daily. 90 tablet 3   conjugated estrogens (PREMARIN) vaginal cream Apply 0.5 gram intravaginally 2 times a week 42.5 g 1   cyclobenzaprine (FLEXERIL) 10 MG tablet TAKE 1 TABLET (10 MG TOTAL) BY MOUTH DAILY AS NEEDED FOR MUSCLE SPASMS. MUST HAVE OFFICE VISIT FOR REFILLS 30 tablet 0   cycloSPORINE (RESTASIS) 0.05 % ophthalmic emulsion Apply  1 drop into both eyes twice a day 180 each 3   Dulaglutide (TRULICITY) 3 YK/5.9DJ SOPN Inject 3 mg as directed once a week. 2 mL 3   DULoxetine 40 MG CPEP Take 80 mg by mouth daily. 60 capsule 3   fluorometholone (FML) 0.1 % ophthalmic suspension Instill 1 drop into both eyes as directed 1 drop 4 times day for 2 weeks then 1 drop 2 times day for 2 weeks 10 mL 0   Galcanezumab-gnlm (EMGALITY) 120 MG/ML SOAJ Inject 120 mg into the skin every 28 (twenty-eight) days. 1.12 mL 5   glucose blood (TRUE METRIX BLOOD GLUCOSE TEST) test strip Use to check blood sugar 3 times daily 100 each 2   hydrOXYzine (ATARAX) 25 MG tablet TAKE 1 TABLET (25 MG TOTAL) BY MOUTH 3 (THREE) TIMES DAILY AS NEEDED. 90 tablet 3   insulin aspart (NOVOLOG FLEXPEN) 100 UNIT/ML FlexPen INJECT 25 UNITS BEFORE BREAKFAST, 15 UNITS BEFORE LUNCH, AND 25 UNITS BEFORE DINNER. 18 mL 2   Insulin Glargine (BASAGLAR KWIKPEN) 100 UNIT/ML Inject 67 Units into the skin at bedtime. 18 mL 2   Insulin Pen Needle (PEN NEEDLES) 31G X 6 MM MISC Use as directed 100 each 0   metoprolol tartrate (LOPRESSOR) 25 MG tablet Take 0.5 tablets (12.5 mg total) by mouth 2 (two) times daily. 90 tablet 2   Multiple Vitamin (MULTIVITAMIN ADULT) TABS Take 1 tablet by mouth daily.     naproxen (NAPROSYN) 500 MG tablet Take 1 tablet (500 mg total) by mouth 2 (two) times daily as needed. 20  tablet 5   ondansetron (ZOFRAN ODT) 4 MG disintegrating tablet Take 1 tablet (4 mg total) by mouth every 8 (eight) hours as needed for nausea or vomiting. 20 tablet 0   pantoprazole (PROTONIX) 20 MG tablet Take 2 tablets (40 mg total) by mouth daily for 14 days. 28 tablet 0   QUEtiapine (SEROQUEL) 100 MG tablet Take 1 tablet (100 mg total) by mouth at bedtime. 30 tablet 3   SUMAtriptan (IMITREX) 100 MG tablet TAKE 1 TABLET EARLIEST ONSET OF MIGRAINE. MAY REPEAT IN 2 HOURS IF HEADACHE PERSISTS OR RECURS. MAXIMUM 2 TABLETS IN 24 HOURS. 10 tablet 5   topiramate (TOPAMAX) 100 MG tablet Take 1 tablet (100 mg total) by mouth at bedtime. 30 tablet 3   TRUEPLUS LANCETS 28G MISC Check blood sugars three time a day 100 each 12   zolpidem (AMBIEN) 5 MG tablet Take 1 tablet (5 mg total) by mouth at bedtime as needed for sleep. 30 tablet 2   No current facility-administered medications for this visit.     Musculoskeletal: Strength & Muscle Tone: within normal limits Gait & Station: normal Patient leans: N/A  Psychiatric Specialty Exam: Review of Systems  There were no vitals taken for this visit.There is no height or weight on file to calculate BMI.  General Appearance: Well Groomed  Eye Contact:  Good  Speech:  Clear and Coherent  Volume:  Normal  Mood:  Anxious and Depressed  Affect:  Appropriate and Congruent  Thought Process:  Coherent, Goal Directed, and Linear  Orientation:  Full (Time, Place, and Person)  Thought Content: WDL and Logical   Suicidal Thoughts:  No  Homicidal Thoughts:  No  Memory:  Immediate;   Good Recent;   Good Remote;   Good  Judgement:  Good  Insight:  Good  Psychomotor Activity:  Normal  Concentration:  Concentration: Good and Attention Span: Good  Recall:  Good  Fund  of Knowledge: Good  Language: Good  Akathisia:  No  Handed:  Right  AIMS (if indicated): not done  Assets:  Communication Skills Desire for Improvement Housing Intimacy Physical  Health Social Support  ADL's:  Intact  Cognition: WNL  Sleep:  Good   Screenings: GAD-7    Flowsheet Row Video Visit from 01/16/2022 in [facility] Office Visit from 11/30/2021 in [facility] Video Visit from 10/23/2021 in [facility] Office Visit from 07/31/2021 in [facility] from 06/14/2021 in [facility]  Total GAD-7 Score _0 PHQ2-9    Flowsheet Row Video Visit from 01/16/2022 in [facility] Office Visit from 11/30/2021 in [facility] Video Visit from 10/23/2021 in [facility] Office Visit from 07/31/2021 in [facility] from 06/14/2021 in [facility]  PHQ-2 Total Score _1 PHQ-9 Total Score _2 [facility] Office Visit from 03/22/2021 in [facility] ED from 02/05/2021 in [facility] ED to Hosp-Admission (Discharged) from 01/23/2021 in [facility]  C-SSRS RISK CATEGORY No Risk No Risk No Risk        Assessment and Plan: Patient endorses symptoms of anxiety, depression. Today patient agreeable to increasing Cymbalta 60 mg to 80 mg to manage anxiety and depression.  She will follow-up with outpatient counseling for therapy regarding grief.  She will continue her other medications as prescribed.  She reports that she has not had her sleep study but reports that she will have the study done to rule out intermittent insomnia.  1. Substance induced mood disorder (HCC)  Continue- DULoxetine HCl 40 MG CPEP; Take 80 mg by mouth daily.  Dispense: 60 capsule; Refill: 3 -Continue zolpidem (AMBIEN) 5 MG tablet; Take 1 tablet (5 mg total) by mouth at bedtime as  needed for sleep.  Dispense: 30 tablet; Refill: 2 Continue- QUEtiapine (SEROQUEL) 100 MG tablet; Take 1 tablet (100 mg total) by mouth at bedtime.  Dispense: 30 tablet; Refill: 3  2. Generalized anxiety disorder  Increase- DULoxetine HCl 40 MG CPEP; Take 80 mg by mouth daily.  Dispense: 60 capsule; Refill: 3 Continue- QUEtiapine (SEROQUEL) 100 MG tablet; Take 1 tablet (100 mg total) by mouth at bedtime.  Dispense: 30 tablet; Refill: 3 Continue- hydrOXYzine (ATARAX) 25 MG tablet; TAKE 1 TABLET (25 MG TOTAL) BY MOUTH 3 (THREE) TIMES DAILY AS NEEDED.  Dispense: 90 tablet; Refill: 3  3. Primary insomnia  Continue- zolpidem (AMBIEN) 5 MG tablet; Take 1 tablet (5 mg total) by mouth at bedtime as needed for sleep.  Dispense: 30 tablet; Refill: 2    Follow-up in 3 months Follow-up with therapy   Salley Slaughter, NP 01/16/2022, 2:02 PM

## 2022-01-17 ENCOUNTER — Other Ambulatory Visit: Payer: Self-pay

## 2022-01-17 ENCOUNTER — Encounter: Payer: Self-pay | Admitting: Cardiology

## 2022-01-17 ENCOUNTER — Ambulatory Visit (INDEPENDENT_AMBULATORY_CARE_PROVIDER_SITE_OTHER): Payer: Self-pay | Admitting: Cardiology

## 2022-01-17 VITALS — BP 122/80 | HR 112 | Ht 63.0 in | Wt 229.4 lb

## 2022-01-17 DIAGNOSIS — E782 Mixed hyperlipidemia: Secondary | ICD-10-CM

## 2022-01-17 DIAGNOSIS — Z794 Long term (current) use of insulin: Secondary | ICD-10-CM

## 2022-01-17 DIAGNOSIS — R000 Tachycardia, unspecified: Secondary | ICD-10-CM

## 2022-01-17 DIAGNOSIS — R079 Chest pain, unspecified: Secondary | ICD-10-CM

## 2022-01-17 DIAGNOSIS — E1165 Type 2 diabetes mellitus with hyperglycemia: Secondary | ICD-10-CM

## 2022-01-17 MED ORDER — ATORVASTATIN CALCIUM 40 MG PO TABS
40.0000 mg | ORAL_TABLET | Freq: Every day | ORAL | 2 refills | Status: DC
  Filled 2022-01-17: qty 30, 30d supply, fill #0
  Filled 2022-03-01: qty 30, 30d supply, fill #1
  Filled 2022-04-26: qty 30, 30d supply, fill #2
  Filled 2022-05-27: qty 30, 30d supply, fill #3

## 2022-01-17 NOTE — Patient Instructions (Signed)
Medication Instructions:   INCREASE YOUR ATORVASTATIN (LIPITOR) 40 MG BY MOUTH DAILY  *If you need a refill on your cardiac medications before your next appointment, please call your pharmacy*   Follow-Up: At [facility], you and your health needs are our priority.  As part of our continuing mission to provide you with exceptional heart care, we have created designated Provider Care Teams.  These Care Teams include your primary Cardiologist (physician) and Advanced Practice Providers (APPs -  Physician Assistants and Nurse Practitioners) who all work together to provide you with the care you need, when you need it.  We recommend signing up for the patient portal called "MyChart".  Sign up information is provided on this After Visit Summary.  MyChart is used to connect with patients for Virtual Visits (Telemedicine).  Patients are able to view lab/test results, encounter notes, upcoming appointments, etc.  Non-urgent messages can be sent to your provider as well.   To learn more about what you can do with MyChart, go to [email or web address].    Your next appointment:   6 month(s)  The format for your next appointment:   In Person  Provider:   DR. Johney Frame    Other Instructions  CALL OUR OFFICE WHEN YOUR INSURANCE GOES THROUGH SO WE CAN ORDER FOR YOU TO GET A CORONARY CT DONE '

## 2022-01-21 ENCOUNTER — Ambulatory Visit (INDEPENDENT_AMBULATORY_CARE_PROVIDER_SITE_OTHER): Payer: No Payment, Other | Admitting: Clinical

## 2022-01-21 ENCOUNTER — Other Ambulatory Visit: Payer: Self-pay

## 2022-01-21 DIAGNOSIS — F4321 Adjustment disorder with depressed mood: Secondary | ICD-10-CM

## 2022-01-21 NOTE — Progress Notes (Signed)
° °  THERAPIST PROGRESS NOTE  Session Time: 30 minutes  Participation Level: Active  Behavioral Response: CasualAlertDepressed  Type of Therapy: Individual Therapy  Treatment Goals addressed: Coping  Interventions: CBT and Supportive  Summary:  Whitney Benton is a 51 y.o. female who presents for the scheduled session oriented x5, appropriately dressed, nicely.  Client denied hallucinations and delusions. Client reported on today that she has been feeling a variety of emotions and thoughts. Client reported her mother passed away 12/30/21. Client reported services went well but she did have a breakdown during the funeral. Client reported it was to be expected but it was not ane expression of emotions that was out of control. Client reported her mother did not change the beneficiary so her sister is the lead person on her mothers money left. Client reported otherwise she feels like she is in a stage of disbelief. Client reported she was present and active during her mothers transition but wakes up thinking her mother is still in the hospital. Client reported she has also been having dreams and visuals of other loved ones that have passed away recently since her mothers passing. Client reported it seems to be within a happy visual but does know why it has been occurring. Client reported she feels like she got all her crying out while her mother was still here. Client reported she does have days when she feels down. Client reported she remembers her mother saying while her dad was ill of "if you've done all you can there is no need to cry". Client reported she has kept that in mind during her reflections. Client reported she spends her time looking after her grandson which makes her happy. Client reported her biggest barrier currently is the disability process. Client reported she keeps getting denied but is working with her lawyers on the appeal.   Suicidal/Homicidal: Nowithout  intent/plan  Therapist Response:  Therapist began the appointment asking the client how she has been doing since last seen. Therapist used CBT to engage with active listening and positive emotional support. Therapist used CBT to engage by asking the client to describe her thoughts and feelings during and after her mothers passing. Therapist used CBT to normalize the clients emotional and thought response. Therapist assigned the client homework to practice self care. Client was scheduled for next appointment.      Plan: Return again in 3 weeks.  Diagnosis: grief    Bernestine Amass, LCSW 01/21/2022

## 2022-01-23 ENCOUNTER — Encounter: Payer: Self-pay | Admitting: *Deleted

## 2022-01-23 ENCOUNTER — Telehealth: Payer: Self-pay | Admitting: Cardiology

## 2022-01-23 ENCOUNTER — Other Ambulatory Visit: Payer: Self-pay

## 2022-01-23 DIAGNOSIS — R072 Precordial pain: Secondary | ICD-10-CM

## 2022-01-23 MED ORDER — IVABRADINE HCL 5 MG PO TABS
15.0000 mg | ORAL_TABLET | Freq: Once | ORAL | 0 refills | Status: AC
  Filled 2022-01-23: qty 3, 1d supply, fill #0

## 2022-01-23 MED ORDER — METOPROLOL TARTRATE 100 MG PO TABS
100.0000 mg | ORAL_TABLET | Freq: Once | ORAL | 0 refills | Status: DC
  Filled 2022-01-23: qty 1, 1d supply, fill #0

## 2022-01-23 MED FILL — Sumatriptan Succinate Tab 100 MG: ORAL | 30 days supply | Qty: 9 | Fill #0 | Status: CN

## 2022-01-23 NOTE — Telephone Encounter (Signed)
Patient called stating Dr. Johney Frame was going to order her a heart scan.

## 2022-01-23 NOTE — Telephone Encounter (Signed)
Dr. Johney Frame saw this pt in clinic on 1/26 and advised for her to call us when her new insurance coverage went through so we could order for her to get a Cardiac CT done.  Order for Cardiac CT and future BMET placed.  Pt will need to take metoprolol 100 mg po for one dose 2 hours prior to her CT in conjunction with Corlanor 15 mg po for one dose take 2 hours prior to Cardiac CT.  Pt aware that Cardiac CT orders were placed and staff message will be sent to our CT Scheduler as well as pre-cert, to call her back and arrange accordingly. She is aware that I will send her Cardiac CT instructions to her via her active mychart account.  Pt states she prefers picking up a copy of the instructions at our front desk either today or tomorrow.  Copy will be printed off and pt aware I will leave this at the front desk to pick up.  Pt will also come for lab to check a BMET tomorrow 01/24/22. Pt verbalized understanding and agrees with this plan.     Your cardiac CT will be scheduled at one of the below locations:   [facility] [address] [place], [place] [address] [phone]   If scheduled at [facility], please arrive at the [facility] main entrance (entrance A) of [facility] 30 minutes prior to test start time. You can use the FREE valet parking offered at the main entrance (encouraged to control the heart rate for the test) Proceed to the [facility] Radiology Department (first floor) to check-in and test prep.   Please follow these instructions carefully (unless otherwise directed):   On the Night Before the Test: Be sure to Drink plenty of water. Do not consume any caffeinated/decaffeinated beverages or chocolate 12 hours prior to your test. Do not take any antihistamines 12 hours prior to your test.   On the Day of the Test: Drink plenty of water until 1 hour prior to the test. Do not eat any food 4 hours prior to the test. You may take your regular  medications prior to the test.  Take metoprolol 100 mg by mouth (Lopressor) two hours prior to test, as well as take ivabradine (corlanor) 15 mg by mouth two hours prior to test. FEMALES- please wear underwire-free bra if available, avoid dresses & tight clothing      After the Test: Drink plenty of water. After receiving IV contrast, you may experience a mild flushed feeling. This is normal. On occasion, you may experience a mild rash up to 24 hours after the test. This is not dangerous. If this occurs, you can take Benadryl 25 mg and increase your fluid intake. If you experience trouble breathing, this can be serious. If it is severe call 911 IMMEDIATELY. If it is mild, please call our office. If you take any of these medications: Glipizide/Metformin, Avandament, Glucavance, please do not take 48 hours after completing test unless otherwise instructed.  We will call to schedule your test 2-4 weeks out understanding that some insurance companies will need an authorization prior to the service being performed.   For non-scheduling related questions, please contact the cardiac imaging nurse navigator should you have any questions/concerns: Marchia Bond, Cardiac Imaging Nurse Navigator Gordy Clement, Cardiac Imaging Nurse Navigator [facility] Direct Office Dial: [phone]   For scheduling needs, including cancellations and rescheduling, please call [place], [phone].

## 2022-01-24 ENCOUNTER — Other Ambulatory Visit: Payer: 59

## 2022-01-24 ENCOUNTER — Other Ambulatory Visit: Payer: Self-pay

## 2022-01-24 ENCOUNTER — Encounter (HOSPITAL_COMMUNITY): Payer: Self-pay

## 2022-01-24 DIAGNOSIS — R072 Precordial pain: Secondary | ICD-10-CM

## 2022-01-24 LAB — BASIC METABOLIC PANEL
BUN/Creatinine Ratio: 13 (ref 9–23)
BUN: 14 mg/dL (ref 6–24)
CO2: 25 mmol/L (ref 20–29)
Calcium: 9.3 mg/dL (ref 8.7–10.2)
Chloride: 100 mmol/L (ref 96–106)
Creatinine, Ser: 1.04 mg/dL — ABNORMAL HIGH (ref 0.57–1.00)
Glucose: 363 mg/dL — ABNORMAL HIGH (ref 70–99)
Potassium: 4.6 mmol/L (ref 3.5–5.2)
Sodium: 140 mmol/L (ref 134–144)
eGFR: 65 mL/min/{1.73_m2} (ref >59)

## 2022-01-25 ENCOUNTER — Other Ambulatory Visit: Payer: Self-pay

## 2022-01-28 ENCOUNTER — Ambulatory Visit: Payer: 59 | Attending: Internal Medicine | Admitting: Pharmacist

## 2022-01-28 ENCOUNTER — Other Ambulatory Visit: Payer: Self-pay

## 2022-01-28 DIAGNOSIS — Z794 Long term (current) use of insulin: Secondary | ICD-10-CM | POA: Diagnosis not present

## 2022-01-28 DIAGNOSIS — E1165 Type 2 diabetes mellitus with hyperglycemia: Secondary | ICD-10-CM

## 2022-01-28 MED ORDER — METFORMIN HCL ER 500 MG PO TB24
500.0000 mg | ORAL_TABLET | Freq: Two times a day (BID) | ORAL | 2 refills | Status: DC
  Filled 2022-01-28: qty 60, 30d supply, fill #0
  Filled 2022-02-23: qty 60, 30d supply, fill #1
  Filled 2022-04-01: qty 60, 30d supply, fill #2

## 2022-01-28 NOTE — Progress Notes (Signed)
S:    PCP: Dr. Wynetta Emery  No chief complaint on file.  Patient arrives in good spirit.  Presents for diabetes evaluation, education, and management Patient was referred and last seen by Primary Care Provider on 11/30/2021.   Family/Social History: Sxh: Former Smoker         Occasional Drinking Fhx: Mother DM, HTN, Depression         Father: DM, HTN  Insurance coverage/medication affordability: [employer]  Medication adherence reported. Patient reported missing few doses of Novolog in the past 7 days but no missed doses of Trulicity and Engineer, agricultural .   Current diabetes medications include: Trulicity 3mg /0.51mL one weekly, Insulin Novolog 25 units before breakfast, 15 units before lunch, 25 units before dinner, Insulin Basaglar 67 units at bedtime Current hypertension medications include: Metoprolol Tartrate 25 mg Twice Daily (patient reported not picking this up yet) Current hyperlipidemia medications include: Atorvastatin 40 mg At Bedtime  Patient denies hypoglycemic events.  Patient reported dietary habits: Patient reported trying to eat more vegetables and less carbs.  Patient-reported exercise habits: Patient reported not being able to do exercise at the time.    O:  Physical Exam  ROS  Lab Results  Component Value Date   HGBA1C 10.7 (A) 11/01/2021   There were no vitals filed for this visit.  Lipid Panel     Component Value Date/Time   CHOL 177 11/30/2021 1039   TRIG 181 (H) 11/30/2021 1039   HDL 40 11/30/2021 1039   CHOLHDL 4.4 11/30/2021 1039   CHOLHDL 3.2 11/14/2014 0707   VLDL 19 11/14/2014 0707   LDLCALC 105 (H) 11/30/2021 1039    Home fasting blood sugars: 218  2 hour post-meal/random blood sugars: 107.  Clinical Atherosclerotic Cardiovascular Disease (ASCVD): No  The 10-year ASCVD risk score (Arnett DK, et al., 2019) is: 11%   Values used to calculate the score:     Age: 51 years     Sex: Female     Is Non-Hispanic African American: Yes      Diabetic: Yes     Tobacco smoker: Yes     Systolic Blood Pressure: 578 mmHg     Is BP treated: No     HDL Cholesterol: 40 mg/dL     Total Cholesterol: 177 mg/dL    A/P: Diabetes longstanding currently uncontrolled. Patient is able to verbalize appropriate hypoglycemia management plan. Medication adherence appears suboptimal. Control is suboptimal due to medication adherence. -Continued basal insulin glargine (insulin Basaglar). 67 units at bedtime -Continued  rapid insulin aspart (insulin Novolog) to 25 units before breakfast, 15 units before lunch, 25 before dinner.  -Continued GLP-1 Trulicity (generic name dulaglutide ) to once weekly.  -Started metformin 500 mg XR BID.  -Extensively discussed pathophysiology of diabetes, recommended lifestyle interventions, dietary effects on blood sugar control -Counseled on s/sx of and management of hypoglycemia -Next A1C anticipated. Patient was due to A1c today but refused to do it. Agreed to take his A1c next month on 02/25/2022.   ASCVD risk - primary prevention in patient with diabetes. Last LDL is not controlled. ASCVD risk score is not >20%  - high intensity statin indicated. Aspirin is not indicated.  -Continued Atorvastatin 40 mg.    Written patient instructions provided.  Total time in face to face counseling 30 minutes.   Follow up Pharmacist in one month Clinic Visit in 01/28/2022.  Patient seen with: Ronnie Doss PharmD Candidate, Class of [employer]  Benard Halsted, PharmD, Para [place], [place] [phone]

## 2022-01-29 ENCOUNTER — Encounter (HOSPITAL_COMMUNITY): Payer: Self-pay

## 2022-01-29 ENCOUNTER — Other Ambulatory Visit: Payer: Self-pay

## 2022-01-30 ENCOUNTER — Other Ambulatory Visit: Payer: Self-pay

## 2022-01-30 NOTE — Telephone Encounter (Signed)
Pt is scheduled for her Cardiac CT on 02/07/22 at 4:30 pm.  Pt made aware of appt date and time by CT Scheduler.

## 2022-02-01 ENCOUNTER — Other Ambulatory Visit: Payer: Self-pay

## 2022-02-04 ENCOUNTER — Other Ambulatory Visit: Payer: Self-pay

## 2022-02-04 ENCOUNTER — Ambulatory Visit (INDEPENDENT_AMBULATORY_CARE_PROVIDER_SITE_OTHER): Payer: 59 | Admitting: Clinical

## 2022-02-04 DIAGNOSIS — F4321 Adjustment disorder with depressed mood: Secondary | ICD-10-CM

## 2022-02-04 NOTE — Progress Notes (Signed)
° °  THERAPIST PROGRESS NOTE  Session Time: 30 minutes  Participation Level: Active  Behavioral Response: CasualAlertEuthymic  Type of Therapy: Individual Therapy  Treatment Goals addressed: Coping  Interventions: CBT and Supportive  Summary:  Whitney Benton is a 51 y.o. female who presents for scheduled session oriented x5, appropriately dressed, and friendly.  Client denies hallucinations and delusions. Client reported on today she is doing fairly well.  Client reported on today she is not able to accurately describe how her mood has been.  Client reported she does things for self-care to help maintain her appearance.  Client stated "I do not want to look like what I have been through or how I am feeling".  Client reported her daily routine for majority of the week includes watching after her grandson.  Client reported she does enjoy having that time with him as it helps to give her a sense of purpose and keeping her time occupied.  Client reported she spends majority of her day outside of the house but going home is stressful for her.  Client reported her sister is sleeping in the living room and does not help to keep the house clean.  Client reported she has had a few crying spells and is still working on getting herself back on track.  Client reported she knows her mother is no longer here but still feels out of sorts.  Client reported during the grieving process she feels like her sister was not as genuine and going through the grieving process but knows that she cannot focus on how other people are handling the journey with grief.  Client reported 1 thing that she wants to do is reach out to her mother's brother who is unable to attend the funeral due to having health issues.  Client reported on Valentine's Day she will visit her mother's grave and take flowers.  Client reported otherwise she is following up with other medical providers.  Client reported she has been having sporadic chest pains  and has an appointment to have a scan of her heart done.  Client reported her blood pressure is fine but her heart beats rapidly.  Client reported she is eating, sleeping and is medication compliant.    Suicidal/Homicidal: Nowithout intent/plan  Therapist Response:  Therapist began the appointment asking the client how she has been doing since last seen. Therapist used CBT to utilize active listening and positive emotional support towards her thoughts and feelings. Therapist used CBT to ask the client to attempt to identify her range of emotions since she was last seen. Therapist used CBT to discuss the client's thought processing ongoing related to her mother's passing. Therapist used CBT to ask the client to describe her daily routine of self-care, medication compliance, and sleep hygiene. Therapist assigned the client homework to consider how she can apply special rituals that help her to remember her mother. Client was scheduled for next appointment.    Plan: Return again in 3 weeks.  Diagnosis: Grief    Birdena Jubilee Davi Kroon, LCSW 02/04/2022

## 2022-02-06 ENCOUNTER — Telehealth (HOSPITAL_COMMUNITY): Payer: Self-pay | Admitting: *Deleted

## 2022-02-06 NOTE — Telephone Encounter (Signed)
Reaching out to patient to offer assistance regarding upcoming cardiac imaging study; pt verbalizes understanding of appt date/time, parking situation and where to check in, pre-test NPO status and medications ordered, and verified current allergies; name and call back number provided for further questions should they arise  Gordy Clement RN Navigator Cardiac Imaging [facility] [phone] office [phone] cell  Patient to take 100mg  metoprolol tartrate and 15mg  ivabradine two hours prior to her cardiac CT scan. She is aware to arrive at 4pm for her 4:30pm scan.

## 2022-02-07 ENCOUNTER — Other Ambulatory Visit: Payer: Self-pay

## 2022-02-07 ENCOUNTER — Ambulatory Visit (HOSPITAL_COMMUNITY)
Admission: RE | Admit: 2022-02-07 | Discharge: 2022-02-07 | Disposition: A | Discharge status: Home / Self Care | Payer: 59 | Source: Ambulatory Visit | Attending: Cardiology | Admitting: Cardiology

## 2022-02-07 ENCOUNTER — Encounter (HOSPITAL_COMMUNITY): Payer: Self-pay

## 2022-02-07 DIAGNOSIS — R072 Precordial pain: Secondary | ICD-10-CM

## 2022-02-07 MED ORDER — IOHEXOL 350 MG/ML SOLN
95.0000 mL | Freq: Once | INTRAVENOUS | Status: AC | PRN
  Administered 2022-02-07: 95 mL via INTRAVENOUS

## 2022-02-07 MED ORDER — NITROGLYCERIN 0.4 MG SL SUBL
SUBLINGUAL_TABLET | SUBLINGUAL | Status: AC
  Filled 2022-02-07: qty 2

## 2022-02-07 MED ORDER — NITROGLYCERIN 0.4 MG SL SUBL
0.8000 mg | SUBLINGUAL_TABLET | Freq: Once | SUBLINGUAL | Status: AC
  Administered 2022-02-07: 0.8 mg via SUBLINGUAL

## 2022-02-13 ENCOUNTER — Other Ambulatory Visit: Payer: Self-pay

## 2022-02-18 ENCOUNTER — Ambulatory Visit (HOSPITAL_COMMUNITY): Payer: 59 | Admitting: Clinical

## 2022-02-25 ENCOUNTER — Other Ambulatory Visit: Payer: Self-pay | Admitting: Internal Medicine

## 2022-02-25 ENCOUNTER — Telehealth: Payer: 59 | Admitting: Neurology

## 2022-02-25 ENCOUNTER — Other Ambulatory Visit: Payer: Self-pay

## 2022-02-26 ENCOUNTER — Other Ambulatory Visit: Payer: Self-pay

## 2022-02-26 MED ORDER — TRULICITY 3 MG/0.5ML ~~LOC~~ SOAJ
3.0000 mg | SUBCUTANEOUS | 3 refills | Status: DC
  Filled 2022-02-26: qty 2, 28d supply, fill #0

## 2022-02-26 NOTE — Telephone Encounter (Signed)
Requested Prescriptions  ?Pending Prescriptions Disp Refills  ?? Dulaglutide (TRULICITY) 3 RF/1.6BW SOPN 2 mL 3  ?  Sig: Inject 3 mg as directed once a week.  ?  ? Endocrinology:  Diabetes - GLP-1 Receptor Agonists Failed - 02/25/2022 10:32 AM  ?  ?  Failed - HBA1C is between 0 and 7.9 and within 180 days  ?  HbA1c, POC (controlled diabetic range)  ?Date Value Ref Range Status  ?11/01/2021 10.7 (A) 0.0 - 7.0 % Final  ?   ?  ?  Passed - Valid encounter within last 6 months  ?  Recent Outpatient Visits   ?      ? 4 weeks ago Type 2 diabetes mellitus with hyperglycemia, with long-term current use of insulin ([place])  ? [place], RPH-CPP  ? 2 months ago Type 2 diabetes mellitus with hyperglycemia, with long-term current use of insulin ([place])  ? [place], RPH-CPP  ? 2 months ago Type 2 diabetes mellitus with hyperglycemia, with long-term current use of insulin ([place])  ? [place] Ladell Pier, MD  ? 3 months ago Type 2 diabetes mellitus with hyperglycemia, with long-term current use of insulin ([place])  ? [place], RPH-CPP  ? 4 months ago Type 2 diabetes mellitus with hyperglycemia, with long-term current use of insulin ([place])  ? [place], RPH-CPP  ?  ?  ?Future Appointments   ?        ? In 1 week Daisy Blossom, Jarome [place], [place]  ? In 1 month Ladell Pier, MD [place]  ?  ? ?  ?  ?  ? ?

## 2022-03-01 ENCOUNTER — Other Ambulatory Visit: Payer: Self-pay

## 2022-03-04 ENCOUNTER — Other Ambulatory Visit: Payer: Self-pay

## 2022-03-05 ENCOUNTER — Other Ambulatory Visit: Payer: Self-pay

## 2022-03-05 ENCOUNTER — Ambulatory Visit: Payer: 59 | Attending: Internal Medicine | Admitting: Pharmacist

## 2022-03-05 DIAGNOSIS — Z794 Long term (current) use of insulin: Secondary | ICD-10-CM | POA: Diagnosis not present

## 2022-03-05 DIAGNOSIS — E1165 Type 2 diabetes mellitus with hyperglycemia: Secondary | ICD-10-CM

## 2022-03-05 NOTE — Progress Notes (Signed)
? ? ?  S:    ?PCP: Dr. Wynetta Emery ? ?No chief complaint on file. ? ?Patient arrives in good spirit.  Presents for diabetes evaluation, education, and management ?Patient was referred and last seen by Primary Care Provider on 11/30/2021. I saw her on 01/28/2022 and added metformin to her regimen.  ? ?Today she endorses some mild diarrhea if she takes the metformin on an empty stomach. Does not wish to stop metformin at this time. Reports compliance with insulin and Trulicity.  ? ?Family/Social History: ?Sxh: Former Smoker ?        Occasional Drinking ?Fhx: Mother DM, HTN, Depression ?        Father: DM, HTN ? ?Insurance coverage/medication affordability: [employer] ? ?Medication adherence reported.  ?Current diabetes medications include: Trulicity '3mg'$  once weekly, Novolog 25 units before breakfast, 15 units before lunch, 25 units before dinner, Basaglar 67 units at bedtime ? ?Patient denies hypoglycemic events. ? ?Patient reported dietary habits: Patient reported trying to eat more vegetables and less carbs. ? ?Patient-reported exercise habits: Patient reported not being able to do exercise at the time. ?  ? ?O:  ?Physical Exam ? ?ROS ? ?Lab Results  ?Component Value Date  ? HGBA1C 10.7 (A) 11/01/2021  ? ?There were no vitals filed for this visit. ? ?Lipid Panel  ?   ?Component Value Date/Time  ? CHOL 177 11/30/2021 1039  ? TRIG 181 (H) 11/30/2021 1039  ? HDL 40 11/30/2021 1039  ? CHOLHDL 4.4 11/30/2021 1039  ? CHOLHDL 3.2 11/14/2014 0707  ? VLDL 19 11/14/2014 0707  ? LDLCALC 105 (H) 11/30/2021 1039  ? ?Not checking blood sugars at home.  ? ?Clinical Atherosclerotic Cardiovascular Disease (ASCVD): No  ?The 10-year ASCVD risk score (Arnett DK, et al., 2019) is: 8.3% ?  Values used to calculate the score: ?    Age: 51 years ?    Sex: Female ?    Is Non-Hispanic African American: Yes ?    Diabetic: Yes ?    Tobacco smoker: Yes ?    Systolic Blood Pressure: 297 mmHg ?    Is BP treated: No ?    HDL Cholesterol: 40  mg/dL ?    Total Cholesterol: 177 mg/dL  ? ? ?A/P: ?Diabetes longstanding currently uncontrolled. Patient is able to verbalize appropriate hypoglycemia management plan. Medication adherence appears optimal. Control is suboptimal due to medication adherence.Cannot make any adjustments as she is not checking blood sugar at home. Advised her to check these and return in 1 month.  ?-Continued Basaglar 67 units at bedtime ?-Continued Novolog 25 units before breakfast, 15 units before lunch, 25 before dinner.  ?-Continued Trulicity '3mg'$  once weekly.  ?-Continue metformin 500 mg XR BID.  ?-Extensively discussed pathophysiology of diabetes, recommended lifestyle interventions, dietary effects on blood sugar control ?-Counseled on s/sx of and management of hypoglycemia ?-Next A1C anticipated. Patient was due to A1c today but refused to do it. Agreed to take his A1c next visit.  ? ?Written patient instructions provided.  Total time in face to face counseling 30 minutes.   ?Follow up Pharmacist visit in one month. ? ?Benard Halsted, PharmD, BCACP, CPP ?Clinical Pharmacist ?[place] ?[phone] ? ?

## 2022-03-07 NOTE — Progress Notes (Signed)
? ?NEUROLOGY FOLLOW UP OFFICE NOTE ? ?Whitney Benton ?[id number] ? ?Assessment/Plan:  ? ?1  Migraine without Aura, Without Status Migrainosus, Not Intractable ? ?2  Bilateral upper extremity pain and numbness - prior workup revealed carpal tunnel syndrome. ?  ?NCV-EMG upper extremities ?Migraine prevention:  Start Aimovig 153m every 28 days ?Migraine rescue:  Take naproxen 5011mwith sumatriptan 10026m?Limit use of pain relievers to no more than 2 days out of week to prevent risk of rebound or medication-overuse headache. ?Keep headache diary ?Follow up 6 months. ?  ?  ?Subjective:  ?Whitney Benton a 49 34ar old right-handed female with diabetes who follows up for migraines and bilateral occipital neuralgia.   ?  ?UPDATE: ?Increased nortriptyline in August  For rescue advised to take sumatriptan 100m31mth naproxen 500mg79mIneffective and had to stop NSAIDs due to GI bleed.  Switched to Em[employer] Corporationnever picked it up.  Her mother has since passed away. ?Intensity:  Moderate to severe ?Duration:  if sumatriptan effective, it lasts 1 to 2 hours, otherwise all day.  Effective 50% of the time.   ?Frequency:  3 days a week ? ?In November 2015, she began experiencing left arm weakness and pain.  She reports numbness in the fingertips and palm of both hands.  MRI of cervical spine 11/14/2014 showed multilevel disc degeneration and spondylosis with bilateral mild foraminal stenosis and mild spinal stenosis at C5-6.  She had a NCV-EMG which revealed bilateral carpal tunnel syndrome.  She was referred to a hand specialist at that time but never followed up.  Continues to have some numbness in left hand.  More recently involving the right hand. ?  ?Rescue therapy:  sumatriptan 100mg 42mequency of abortive medication: daily ?Current NSAIDS:  none ?Current analgesics:  Tylenol ?Current triptans:  sumatriptan 100mg ?39ment ergotamine:  none ?Current anti-emetic:  Zofran ODT 4mg ?Cu45mnt muscle relaxants:  Flexeril 10mg  QH20murrent anti-anxiolytic:  hydroxyzine ?Current sleep aide:  melatonin ?Current Antihypertensive medications:  metoprolol tartrate ?Current Antidepressant medications:  Cymbalta 60mg dail65murrent Anticonvulsant medications:  none ?Current anti-CGRP:  Emgality ?Current Vitamins/Herbal/Supplements:  Melatonin, B12 ?Current Antihistamines/Decongestants:  none ?Other therapy:  none ?Hormone/birth control:  none ?  ?Caffeine:  Cut down on coffee.  Now only decaff.  No soda ?Diet:  Drinks water with electrolytes.  Does not skip meals ?Exercise:  Walks 30 minutes daily ?Depression:  yes; Anxiety:  yes ?Other pain:  no ?Sleep hygiene:  poor ?  ?HISTORY:  ?Migraines since 2000 but returned around 2019.  Initial workup in early 2000s included lumbar puncture but unsure of the results.  She doesn't remember if she had an MRI of the brain.  They are severe sharp and pounding pain in back of head bilaterally and radiates to the front.  She has associated flashes in her vision, photophobia, phonophobia, osmophobia, feels off-balance and sometimes nausea but no  Autonomic symptoms, numbness or weakness.  They usually last 2-3 weeks.  They occur about twice a year, usually in Spring and Summer but can occur other times.  Triggers unknown.  Nothing really relieves them. ?  ?CT brain on 06/14/2020 personally reviewed was normal. ?Eye exam in 2021 okay. ?  ?Past NSAIDS:  Meloxicam, ketorolac, ibuprofen, naproxen ?Past analgesics:  Tylenol, Excedrin ?Past abortive triptans:  rizatriptan 10mg.  Sum77mptan NS too expensive. ?Past abortive ergotamine:  none ?Past muscle relaxants:  Robaxin ?Past anti-emetic:  promethazine ?Past antihypertensive medications:  none ?Past antidepressant medications:  Nortriptyline, sertraline 50mg  ?Past anticonvulsant medications:  topiramate 100mg  ?Past anti-CGRP:  none ?Past vitamins/Herbal/Supplements:  none ?Past antihistamines/decongestants:  Benadryl, Zyrtec ?Other past therapies:  none ?  ?   ?Family history of headache:  no ? ?PAST MEDICAL HISTORY: ?Past Medical History:  ?Diagnosis Date  ? Anxiety   ? Depression   ? Diabetes mellitus without complication ([place])   ? Trichomonas infection   ? ? ?MEDICATIONS: ?Current Outpatient Medications on File Prior to Visit  ?Medication Sig Dispense Refill  ? atorvastatin (LIPITOR) 40 MG tablet Take 1 tablet (40 mg total) by mouth daily. 90 tablet 2  ? Blood Glucose Monitoring Suppl (TRUE METRIX METER) w/Device KIT Check blood sugars three times a day 1 kit 0  ? busPIRone (BUSPAR) 15 MG tablet Take 1 tablet (15 mg total) by mouth 3 (three) times daily. 90 tablet 3  ? conjugated estrogens (PREMARIN) vaginal cream Apply 0.5 gram intravaginally 2 times a week 42.5 g 1  ? cyclobenzaprine (FLEXERIL) 10 MG tablet TAKE 1 TABLET (10 MG TOTAL) BY MOUTH DAILY AS NEEDED FOR MUSCLE SPASMS. MUST HAVE OFFICE VISIT FOR REFILLS 30 tablet 0  ? cycloSPORINE (RESTASIS) 0.05 % ophthalmic emulsion Apply 1 drop into both eyes twice a day 180 each 3  ? Dulaglutide (TRULICITY) 3 FH/2.1FX SOPN Inject 3 mg as directed once a week. 2 mL 3  ? DULoxetine HCl 40 MG CPEP Take 80 mg (2 capsules) by mouth daily. 60 capsule 3  ? fluorometholone (FML) 0.1 % ophthalmic suspension Instill 1 drop into both eyes as directed 1 drop 4 times day for 2 weeks then 1 drop 2 times day for 2 weeks 10 mL 0  ? Galcanezumab-gnlm (EMGALITY) 120 MG/ML SOAJ Inject 120 mg into the skin every 28 (twenty-eight) days. 1.12 mL 5  ? glucose blood (TRUE METRIX BLOOD GLUCOSE TEST) test strip Use to check blood sugar 3 times daily 100 each 2  ? hydrOXYzine (ATARAX) 25 MG tablet TAKE 1 TABLET (25 MG TOTAL) BY MOUTH 3 (THREE) TIMES DAILY AS NEEDED. 90 tablet 3  ? insulin aspart (NOVOLOG FLEXPEN) 100 UNIT/ML FlexPen INJECT 25 UNITS BEFORE BREAKFAST, 15 UNITS BEFORE LUNCH, AND 25 UNITS BEFORE DINNER. 18 mL 2  ? Insulin Glargine (BASAGLAR KWIKPEN) 100 UNIT/ML Inject 67 Units into the skin at bedtime. 18 mL 2  ? Insulin Pen Needle  (PEN NEEDLES) 31G X 6 MM MISC Use as directed 100 each 0  ? metFORMIN (GLUCOPHAGE-XR) 500 MG 24 hr tablet Take 1 tablet (500 mg total) by mouth 2 (two) times daily. 60 tablet 2  ? metoprolol tartrate (LOPRESSOR) 100 MG tablet Take 1 tablet (100 mg total) by mouth once for 1 dose. Take 90-120 minutes prior to scan. 1 tablet 0  ? metoprolol tartrate (LOPRESSOR) 25 MG tablet Take 0.5 tablets (12.5 mg total) by mouth 2 (two) times daily. 90 tablet 2  ? Multiple Vitamin (MULTIVITAMIN ADULT) TABS Take 1 tablet by mouth daily.    ? naproxen (NAPROSYN) 500 MG tablet Take 1 tablet (500 mg total) by mouth 2 (two) times daily as needed. 20 tablet 5  ? ondansetron (ZOFRAN ODT) 4 MG disintegrating tablet Take 1 tablet (4 mg total) by mouth every 8 (eight) hours as needed for nausea or vomiting. 20 tablet 0  ? QUEtiapine (SEROQUEL) 100 MG tablet Take 1 tablet (100 mg total) by mouth at bedtime. 30 tablet 3  ? SUMAtriptan (IMITREX) 100 MG tablet TAKE 1 TABLET EARLIEST ONSET OF MIGRAINE. MAY REPEAT IN  2 HOURS IF HEADACHE PERSISTS OR RECURS. MAXIMUM 2 TABLETS IN 24 HOURS. 10 tablet 5  ? TRUEPLUS LANCETS 28G MISC Check blood sugars three time a day 100 each 12  ? zolpidem (AMBIEN) 5 MG tablet Take 1 tablet (5 mg total) by mouth at bedtime as needed for sleep. 30 tablet 2  ? [DISCONTINUED] cetirizine (ZYRTEC) 10 MG tablet Take 1 tablet (10 mg total) by mouth daily. (Patient not taking: No sig reported) 30 tablet 1  ? ?No current facility-administered medications on file prior to visit.  ? ? ?ALLERGIES: ?Allergies  ?Allergen Reactions  ? Aspirin Hives  ? Oxycodone Nausea And Vomiting  ? ? ?FAMILY HISTORY: ?Family History  ?Problem Relation Age of Onset  ? Diabetes Mother   ? Mental illness Mother   ? Depression Mother   ? Hypertension Mother   ? Hypertension Father   ? Diabetes Father   ? ? ?  ?Objective:  ?Blood pressure 120/76, pulse 98, height 5' 7"  (1.702 m), weight 229 lb (103.9 kg), SpO2 100 %. ?General: No acute distress.   Patient appears well-groomed.   ?Head:  Normocephalic/atraumatic ? ? ? ?Metta Clines, DO ? ?CC: Karle Plumber, MD ? ? ? ? ?  ?

## 2022-03-08 ENCOUNTER — Other Ambulatory Visit: Payer: Self-pay

## 2022-03-08 ENCOUNTER — Ambulatory Visit (INDEPENDENT_AMBULATORY_CARE_PROVIDER_SITE_OTHER): Payer: 59 | Admitting: Neurology

## 2022-03-08 ENCOUNTER — Encounter: Payer: Self-pay | Admitting: Neurology

## 2022-03-08 VITALS — BP 120/76 | HR 98 | Ht 67.0 in | Wt 229.0 lb

## 2022-03-08 DIAGNOSIS — G43009 Migraine without aura, not intractable, without status migrainosus: Secondary | ICD-10-CM

## 2022-03-08 DIAGNOSIS — G43709 Chronic migraine without aura, not intractable, without status migrainosus: Secondary | ICD-10-CM | POA: Diagnosis not present

## 2022-03-08 DIAGNOSIS — R200 Anesthesia of skin: Secondary | ICD-10-CM

## 2022-03-08 MED ORDER — AIMOVIG 140 MG/ML ~~LOC~~ SOAJ: 140.0000 mg | SUBCUTANEOUS | 5 refills | Status: DC

## 2022-03-08 NOTE — Patient Instructions (Signed)
Nerve conduction study of both hands ?Start Aimovig '140mg'$  every 28 days ?Sumatriptan '100mg'$  as needed/instructed.  Limit use of pain relievers to no more than 2 days out of week to prevent risk of rebound or medication-overuse headache. ?Keep headache diary ?Follow up 6 months ?

## 2022-03-11 ENCOUNTER — Telehealth: Payer: Self-pay | Admitting: Neurology

## 2022-03-11 NOTE — Telephone Encounter (Signed)
Patient called and said her medication is needing a PA.  ? ?She does not remember the name of the medication but only that it is taken every 28 days and she was not able to get it from the pharmacy. ?

## 2022-03-11 NOTE — Telephone Encounter (Signed)
Whitney Benton (Key: [id number]) ? ?[facility] has not yet replied to your PA request. You may close this dialog, return to your dashboard, and perform other tasks. ? ?To check for an update later, open this request again from your dashboard. ? ?If [facility] has not replied within 72 hours for urgent requests and up to 15 days for standard requests, please contact [facility] at ?

## 2022-03-15 ENCOUNTER — Other Ambulatory Visit: Payer: Self-pay

## 2022-03-15 ENCOUNTER — Ambulatory Visit (INDEPENDENT_AMBULATORY_CARE_PROVIDER_SITE_OTHER): Payer: 59 | Admitting: Clinical

## 2022-03-15 DIAGNOSIS — F4321 Adjustment disorder with depressed mood: Secondary | ICD-10-CM | POA: Diagnosis not present

## 2022-03-16 NOTE — Plan of Care (Signed)
Client was in agreement with the plan. ?

## 2022-03-16 NOTE — Progress Notes (Signed)
? ?  THERAPIST PROGRESS NOTE ? ?Session Time: 30 minutes ? ?Participation Level: Active ? ?Behavioral Response: CasualAlertDepressed ? ?Type of Therapy: Individual Therapy ? ?Treatment Goals addressed: complete 80% of homewor ? ?ProgressTowards Goals: Progressing ? ?Interventions: CBT and Supportive ? ?Summary:  ?Whitney Benton is a 51 y.o. female who presents for the scheduled session oriented times five, appropriately dressed, and friendly. Client denied hallucinations and delusions. ?Client reported on today she has been doing about the same since last seen. Client reported her parents wedding anniversary is coming up. Client reported she is used to giving her mother a gift on her anniversary so she is not sure how she will spend the day this time. Client reported the songs her parents enjoyed have been coming on and giving her a nostalgic mood. Client reported her sister is still at her house and it causes stress because her sister does not help to keep her area clean. Client reported her nieces have been staying with er as well and she has them structured to clean up but it is hard because they see their mom not doing anything. Client reported otherwise she has been staying busy with watching her grandson daily. ?Evidence of progress towards goal:  client reported practicing self care at least 6 out of 7 days per week.  ? ? ?Suicidal/Homicidal: Nowithout intent/plan ? ?Therapist Response:  ?Therapist began the appointment asking the client how she has been doing since last seen. ?Therapist used CBT engage using active listening and positive emotional support towards her thoughts and feelings. ?Therapist used CBT to engage and discuss the grieving process. ?Therapist used CBT to engage by normalizing her emotional response and how it is to be incorporated in daily life to lessen severity of symptoms. ?Therapist used CBT ask the client to identify her progress with frequency of use with coping skills with continued  practice in her daily activity.    ?Therapist assigned the client homework to write a letter to her mother or both parents on their anniversary. ?Client was scheduled for next appointment. ? ? ? ?Plan: Return again in 5 weeks. ? ?Diagnosis: Grief ? ?Collaboration of Care: Patient refused AEB none requested by clients ? ?Patient/Guardian was advised Release of Information must be obtained prior to any record release in order to collaborate their care with an outside provider. Patient/Guardian was advised if they have not already done so to contact the registration department to sign all necessary forms in order for [place] to release information regarding their care.  ? ?Consent: Patient/Guardian gives verbal consent for treatment and assignment of benefits for services provided during this visit. Patient/Guardian expressed understanding and agreed to proceed.  ? ?Birdena Jubilee [name], LCSW ?03/15/2022 ? ?

## 2022-03-21 ENCOUNTER — Other Ambulatory Visit: Payer: Self-pay

## 2022-03-21 ENCOUNTER — Other Ambulatory Visit: Payer: Self-pay | Admitting: Pharmacist

## 2022-03-21 MED ORDER — TRULICITY 4.5 MG/0.5ML ~~LOC~~ SOAJ
4.5000 mg | SUBCUTANEOUS | 3 refills | Status: DC
  Filled 2022-03-21 – 2022-10-23 (×2): qty 2, 28d supply, fill #0
  Filled 2022-11-08: qty 6, 84d supply, fill #1

## 2022-03-21 MED ORDER — TRULICITY 4.5 MG/0.5ML ~~LOC~~ SOAJ: 4.5000 mg | SUBCUTANEOUS | 3 refills | Status: DC

## 2022-03-26 ENCOUNTER — Other Ambulatory Visit: Payer: Self-pay

## 2022-03-26 ENCOUNTER — Other Ambulatory Visit: Payer: Self-pay | Admitting: Internal Medicine

## 2022-03-26 DIAGNOSIS — Z794 Long term (current) use of insulin: Secondary | ICD-10-CM

## 2022-03-26 DIAGNOSIS — E1165 Type 2 diabetes mellitus with hyperglycemia: Secondary | ICD-10-CM

## 2022-03-27 ENCOUNTER — Other Ambulatory Visit: Payer: Self-pay

## 2022-03-27 MED ORDER — BASAGLAR KWIKPEN 100 UNIT/ML ~~LOC~~ SOPN
67.0000 [IU] | PEN_INJECTOR | Freq: Every day | SUBCUTANEOUS | 2 refills | Status: DC
  Filled 2022-03-27: qty 18, 26d supply, fill #0
  Filled 2022-04-26: qty 18, 26d supply, fill #1
  Filled 2022-05-22: qty 18, 26d supply, fill #2

## 2022-03-28 ENCOUNTER — Other Ambulatory Visit: Payer: Self-pay

## 2022-04-01 ENCOUNTER — Telehealth (INDEPENDENT_AMBULATORY_CARE_PROVIDER_SITE_OTHER): Payer: 59 | Admitting: Psychiatry

## 2022-04-01 DIAGNOSIS — F1994 Other psychoactive substance use, unspecified with psychoactive substance-induced mood disorder: Secondary | ICD-10-CM | POA: Diagnosis not present

## 2022-04-01 DIAGNOSIS — F411 Generalized anxiety disorder: Secondary | ICD-10-CM | POA: Diagnosis not present

## 2022-04-01 DIAGNOSIS — F5101 Primary insomnia: Secondary | ICD-10-CM | POA: Diagnosis not present

## 2022-04-01 MED ORDER — ZOLPIDEM TARTRATE 5 MG PO TABS: 5.0000 mg | ORAL_TABLET | Freq: Every evening | ORAL | 1 refills | Status: DC | PRN

## 2022-04-01 MED ORDER — HYDROXYZINE HCL 25 MG PO TABS: ORAL_TABLET | Freq: Three times a day (TID) | ORAL | 1 refills | Status: DC | PRN

## 2022-04-01 MED ORDER — DULOXETINE HCL 40 MG PO CPEP: 80.0000 mg | ORAL_CAPSULE | Freq: Every day | ORAL | 1 refills | Status: DC

## 2022-04-01 MED ORDER — QUETIAPINE FUMARATE 100 MG PO TABS: 100.0000 mg | ORAL_TABLET | Freq: Every day | ORAL | 1 refills | Status: DC

## 2022-04-01 MED ORDER — BUSPIRONE HCL 15 MG PO TABS: 15.0000 mg | ORAL_TABLET | Freq: Three times a day (TID) | ORAL | 1 refills | Status: DC

## 2022-04-01 NOTE — Progress Notes (Signed)
BH MD/PA/NP OP Progress Note ? ?04/01/2022 5:55 PM ?Whitney Benton  ?MRN:  [id number] ? ?Virtual Visit via Video Note ? ?I connected with Whitney Benton on 04/01/22 at  4:00 PM EDT by a video enabled telemedicine application and verified that I am speaking with the correct person using two identifiers. ? ?Location: ?Patient: home ?Provider: offsite ?  ?I discussed the limitations of evaluation and management by telemedicine and the availability of in person appointments. The patient expressed understanding and agreed to proceed. ? ? ?  ?I discussed the assessment and treatment plan with the patient. The patient was provided an opportunity to ask questions and all were answered. The patient agreed with the plan and demonstrated an understanding of the instructions. ?  ?The patient was advised to call back or seek an in-person evaluation if the symptoms worsen or if the condition fails to improve as anticipated. ? ?I provided 5 minutes of non-face-to-face time during this encounter. ? ? ?Franne Grip, NP  ? ?Chief Complaint: Medication management ? ?HPI: Whitney Benton is a 51 year old female presenting to [facility] behavioral health outpatient for follow-up psychiatric evaluation.  Patient has a psychiatric history of major depressive disorder, generalized anxiety disorder and insomnia.  Patient's symptoms are managed with Ambien 5 mg at bedtime as needed for sleep, Seroquel 100 mg daily at bedtime, hydroxyzine 25 mg 3 times daily as needed for anxiety, Cymbalta 80 mg daily and BuSpar 15 mg 3 times a day.  Patient reports that medications are effective with managing her symptoms.  She reports medication compliance.  Patient denies adverse effects or need for dosage adjustment today.  No medication changes today. ? ?Visit Diagnosis:  ?  ICD-10-CM   ?1. Substance induced mood disorder (HCC)  F19.94 DULoxetine HCl 40 MG CPEP  ?  QUEtiapine (SEROQUEL) 100 MG tablet  ?  zolpidem (AMBIEN) 5 MG tablet  ?  ?2. Generalized  anxiety disorder  F41.1 DULoxetine HCl 40 MG CPEP  ?  hydrOXYzine (ATARAX) 25 MG tablet  ?  QUEtiapine (SEROQUEL) 100 MG tablet  ?  ?3. Primary insomnia  F51.01 zolpidem (AMBIEN) 5 MG tablet  ?  ? ? ?Past Psychiatric History:  ? ?Past Medical History:  ?Past Medical History:  ?Diagnosis Date  ? Anxiety   ? Depression   ? Diabetes mellitus without complication ([place])   ? Trichomonas infection   ?  ?Past Surgical History:  ?Procedure Laterality Date  ? CESAREAN SECTION    ? VAGINAL HYSTERECTOMY  2006  ? Fibroids, menorrhagia, benign pathology  ? ? ?Family Psychiatric History: See below ? ?Family History:  ?Family History  ?Problem Relation Age of Onset  ? Diabetes Mother   ? Mental illness Mother   ? Depression Mother   ? Hypertension Mother   ? Hypertension Father   ? Diabetes Father   ? ? ?Social History:  ?Social History  ? ?Socioeconomic History  ? Marital status: Significant Other  ?  Spouse name: Not on file  ? Number of children: 3  ? Years of education: 45  ? Highest education level: Not on file  ?Occupational History  ? Not on file  ?Tobacco Use  ? Smoking status: Former  ?  Packs/day: 0.25  ?  Years: 8.00  ?  Pack years: 2.00  ?  Types: Cigarettes  ? Smokeless tobacco: Never  ?Vaping Use  ? Vaping Use: Never used  ?Substance and Sexual Activity  ? Alcohol use: Yes  ?  Comment: occasional  ? Drug use: No  ? Sexual activity: Yes  ?  Birth control/protection: Surgical  ?Other Topics Concern  ? Not on file  ?Social History Narrative  ? Patient lives at home with mother and father , one story   ? Patient has 3 children   ? Patient is single  ? Patient has 14 years of education   ? Patient is right handed   ? ?Social Determinants of Health  ? ?Financial Resource Strain: Not on file  ?Food Insecurity: Not on file  ?Transportation Needs: Not on file  ?Physical Activity: Not on file  ?Stress: Not on file  ?Social Connections: Not on file  ? ? ?Allergies:  ?Allergies  ?Allergen Reactions  ? Aspirin Hives  ? Oxycodone  Nausea And Vomiting  ? ? ?Metabolic Disorder Labs: ?Lab Results  ?Component Value Date  ? HGBA1C 10.7 (A) 11/01/2021  ? MPG 380.93 10/02/2019  ? MPG 289.09 01/06/2019  ? ?No results found for: PROLACTIN ?Lab Results  ?Component Value Date  ? CHOL 177 11/30/2021  ? TRIG 181 (H) 11/30/2021  ? HDL 40 11/30/2021  ? CHOLHDL 4.4 11/30/2021  ? VLDL 19 11/14/2014  ? LDLCALC 105 (H) 11/30/2021  ? [place] 92 02/04/2020  ? ?Lab Results  ?Component Value Date  ? TSH 2.460 04/30/2021  ? TSH 0.014 (L) 10/02/2019  ? ? ?Therapeutic Level Labs: ?No results found for: LITHIUM ?No results found for: VALPROATE ?No components found for:  CBMZ ? ?Current Medications: ?Current Outpatient Medications  ?Medication Sig Dispense Refill  ? atorvastatin (LIPITOR) 40 MG tablet Take 1 tablet (40 mg total) by mouth daily. 90 tablet 2  ? Blood Glucose Monitoring Suppl (TRUE METRIX METER) w/Device KIT Check blood sugars three times a day 1 kit 0  ? busPIRone (BUSPAR) 15 MG tablet Take 1 tablet (15 mg total) by mouth 3 (three) times daily. 90 tablet 1  ? conjugated estrogens (PREMARIN) vaginal cream Apply 0.5 gram intravaginally 2 times a week 42.5 g 1  ? cycloSPORINE (RESTASIS) 0.05 % ophthalmic emulsion Apply 1 drop into both eyes twice a day 180 each 3  ? Dulaglutide (TRULICITY) 4.5 BT/5.1VO SOPN Inject 4.5 mg as directed once a week. 2 mL 3  ? DULoxetine HCl 40 MG CPEP Take 80 mg (2 capsules) by mouth daily. 60 capsule 1  ? Erenumab-aooe (AIMOVIG) 140 MG/ML SOAJ Inject 140 mg into the skin every 28 (twenty-eight) days. 1.12 mL 5  ? fluorometholone (FML) 0.1 % ophthalmic suspension Instill 1 drop into both eyes as directed 1 drop 4 times day for 2 weeks then 1 drop 2 times day for 2 weeks 10 mL 0  ? Galcanezumab-gnlm (EMGALITY) 120 MG/ML SOAJ Inject 120 mg into the skin every 28 (twenty-eight) days. 1.12 mL 5  ? glucose blood (TRUE METRIX BLOOD GLUCOSE TEST) test strip Use to check blood sugar 3 times daily 100 each 2  ? hydrOXYzine (ATARAX) 25 MG  tablet TAKE 1 TABLET (25 MG TOTAL) BY MOUTH 3 (THREE) TIMES DAILY AS NEEDED. 90 tablet 1  ? insulin aspart (NOVOLOG FLEXPEN) 100 UNIT/ML FlexPen INJECT 25 UNITS BEFORE BREAKFAST, 15 UNITS BEFORE LUNCH, AND 25 UNITS BEFORE DINNER. 18 mL 2  ? Insulin Glargine (BASAGLAR KWIKPEN) 100 UNIT/ML Inject 67 Units into the skin at bedtime. 18 mL 2  ? Insulin Pen Needle (PEN NEEDLES) 31G X 6 MM MISC Use as directed 100 each 0  ? metFORMIN (GLUCOPHAGE-XR) 500 MG 24 hr tablet Take 1 tablet (  500 mg total) by mouth 2 (two) times daily. 60 tablet 2  ? metoprolol tartrate (LOPRESSOR) 100 MG tablet Take 1 tablet (100 mg total) by mouth once for 1 dose. Take 90-120 minutes prior to scan. 1 tablet 0  ? metoprolol tartrate (LOPRESSOR) 25 MG tablet Take 0.5 tablets (12.5 mg total) by mouth 2 (two) times daily. 90 tablet 2  ? Multiple Vitamin (MULTIVITAMIN ADULT) TABS Take 1 tablet by mouth daily.    ? naproxen (NAPROSYN) 500 MG tablet Take 1 tablet (500 mg total) by mouth 2 (two) times daily as needed. 20 tablet 5  ? ondansetron (ZOFRAN ODT) 4 MG disintegrating tablet Take 1 tablet (4 mg total) by mouth every 8 (eight) hours as needed for nausea or vomiting. 20 tablet 0  ? QUEtiapine (SEROQUEL) 100 MG tablet Take 1 tablet (100 mg total) by mouth at bedtime. 30 tablet 1  ? SUMAtriptan (IMITREX) 100 MG tablet TAKE 1 TABLET EARLIEST ONSET OF MIGRAINE. MAY REPEAT IN 2 HOURS IF HEADACHE PERSISTS OR RECURS. MAXIMUM 2 TABLETS IN 24 HOURS. 10 tablet 5  ? TRUEPLUS LANCETS 28G MISC Check blood sugars three time a day 100 each 12  ? zolpidem (AMBIEN) 5 MG tablet Take 1 tablet (5 mg total) by mouth at bedtime as needed for sleep. 30 tablet 1  ? ?No current facility-administered medications for this visit.  ? ? ? ?Musculoskeletal: ?Strength & Muscle Tone: N/A virtual visit ?Gait & Station: N/A virtual visit ?Patient leans: N/A ? ?Psychiatric Specialty Exam: ?Review of Systems  ?Psychiatric/Behavioral:  Negative for hallucinations, self-injury and  suicidal ideas.   ?All other systems reviewed and are negative.  ?There were no vitals taken for this visit.There is no height or weight on file to calculate BMI.  ?General Appearance: Well-groomed  ?Eye Cont

## 2022-04-02 ENCOUNTER — Telehealth (HOSPITAL_COMMUNITY): Payer: Self-pay | Admitting: *Deleted

## 2022-04-02 ENCOUNTER — Other Ambulatory Visit: Payer: Self-pay

## 2022-04-02 NOTE — Telephone Encounter (Signed)
PATIENT HAS [employer] INSURANCE  WAS INFORMED BY THE Rx  THAT THE DULoxetine HCl 40 MG CPEP ?Take 80 mg (2 capsules) by mouth daily  ? DOESN'T REQUIRE A PRIOR AUTHORIZATION BECAUSE 80 MG ISN'T ON THE FORMULARY & WOULD BE DENIED. ? ?Rx SUGGESTED THAT THE PROVIDER RE-WRITE  SCRIPT FOR  60 MG & 20 MG TO = 80 MG  BECAUSE THEY ARE ON THE FORMULARY & WOULD BE COVERED. ?

## 2022-04-04 ENCOUNTER — Other Ambulatory Visit (HOSPITAL_COMMUNITY): Payer: Self-pay | Admitting: Psychiatry

## 2022-04-04 ENCOUNTER — Other Ambulatory Visit (HOSPITAL_COMMUNITY): Payer: Self-pay | Admitting: *Deleted

## 2022-04-04 ENCOUNTER — Ambulatory Visit: Payer: 59 | Attending: Internal Medicine | Admitting: Internal Medicine

## 2022-04-04 ENCOUNTER — Telehealth (HOSPITAL_COMMUNITY): Payer: Self-pay | Admitting: *Deleted

## 2022-04-04 ENCOUNTER — Encounter: Payer: Self-pay | Admitting: Internal Medicine

## 2022-04-04 ENCOUNTER — Other Ambulatory Visit: Payer: Self-pay

## 2022-04-04 VITALS — BP 128/88 | HR 110 | Resp 16 | Wt 230.2 lb

## 2022-04-04 DIAGNOSIS — E1165 Type 2 diabetes mellitus with hyperglycemia: Secondary | ICD-10-CM

## 2022-04-04 DIAGNOSIS — Z794 Long term (current) use of insulin: Secondary | ICD-10-CM | POA: Diagnosis not present

## 2022-04-04 DIAGNOSIS — Z1211 Encounter for screening for malignant neoplasm of colon: Secondary | ICD-10-CM

## 2022-04-04 DIAGNOSIS — F1994 Other psychoactive substance use, unspecified with psychoactive substance-induced mood disorder: Secondary | ICD-10-CM

## 2022-04-04 DIAGNOSIS — F331 Major depressive disorder, recurrent, moderate: Secondary | ICD-10-CM

## 2022-04-04 DIAGNOSIS — E785 Hyperlipidemia, unspecified: Secondary | ICD-10-CM | POA: Diagnosis not present

## 2022-04-04 DIAGNOSIS — Z1231 Encounter for screening mammogram for malignant neoplasm of breast: Secondary | ICD-10-CM

## 2022-04-04 DIAGNOSIS — E1169 Type 2 diabetes mellitus with other specified complication: Secondary | ICD-10-CM | POA: Diagnosis not present

## 2022-04-04 DIAGNOSIS — F411 Generalized anxiety disorder: Secondary | ICD-10-CM

## 2022-04-04 DIAGNOSIS — R000 Tachycardia, unspecified: Secondary | ICD-10-CM

## 2022-04-04 LAB — POCT GLYCOSYLATED HEMOGLOBIN (HGB A1C): HbA1c, POC (controlled diabetic range): 11.2 % — AB (ref 0.0–7.0)

## 2022-04-04 LAB — GLUCOSE, POCT (MANUAL RESULT ENTRY): POC Glucose: 402 mg/dl — AB (ref 70–99)

## 2022-04-04 MED ORDER — FREESTYLE LIBRE SENSOR SYSTEM MISC
12 refills | Status: DC
  Filled 2022-04-04: qty 2, fill #0

## 2022-04-04 MED ORDER — DULOXETINE HCL 60 MG PO CPEP: 60.0000 mg | ORAL_CAPSULE | Freq: Every day | ORAL | 1 refills | Status: DC

## 2022-04-04 MED ORDER — FREESTYLE LIBRE READER DEVI
1.0000 | Freq: Once | 0 refills | Status: AC
  Filled 2022-04-04: qty 1, fill #0

## 2022-04-04 MED ORDER — DULOXETINE HCL 20 MG PO CPEP: 20.0000 mg | ORAL_CAPSULE | Freq: Every day | ORAL | 1 refills | Status: DC

## 2022-04-04 NOTE — Progress Notes (Signed)
? ? ?Patient ID: AALAYSIA LIGGINS, female    DOB: 11-28-1971  MRN: [id number] ? ?CC: Diabetes, Hypertension, and Hand Pain (B/l) ? ? ?Subjective: ?Whitney Benton is a 51 y.o. female who presents for chronic ds management.  Pt has her 6 mth grand-son is with her ?Her concerns today include:  ?Pt with hx of depression, migraines (Dr. Tomi Likens), mixed incontinence, DM, obesity, HL, vit D def, former smoker ? ?DM:  ?Results for orders placed or performed in visit on 04/04/22  ?POCT glucose (manual entry)  ?Result Value Ref Range  ? POC Glucose 402 (A) 70 - 99 mg/dl  ?POCT glycosylated hemoglobin (Hb A1C)  ?Result Value Ref Range  ? Hemoglobin A1C    ? HbA1c POC (<> result, manual entry)    ? HbA1c, POC (prediabetic range)    ? HbA1c, POC (controlled diabetic range) 11.2 (A) 0.0 - 7.0 %  ? ?Pt still not checking BS.  Reports she has been busy moving and misplaced her device.  She feels she can located it  ?A1C increased from 10.7 on last visit ?Reports she has been missing doses of insulin due to her currently living b/w 2 houses.  Plans to get settle soon.  Should be on Basaglar 67 daily , Novolog 97/35/32, Trulicity 3 mg and Metformin 500 BID ?Not doing well with eating habits either. Agrees to see nutritionist now that she has insurance ?- has eye exam scheduled for 04/22/2022 with Dr. Katy Fitch. ? ?Tachycardia: she has not taken Metoprolol as yet today ?Had Coronary CT with calcium level score of 1.  Overall not very high.  Cardiologist recommended that she continue Lipitor and metoprolol. ? ?HL: taking and tolerating Lipitor ? ?MDD:  PhQ9 score improved.  Plugged in with [facility].  Reports feeling better on current meds that include Ambien, Cymbalta, ? ?HM:  Request referral for MMG.  Prefers to have c-scope for colon CA screening ?Patient Active Problem List  ? Diagnosis Date Noted  ? Former smoker 04/30/2021  ? Major depressive disorder, recurrent episode, moderate ([place]) 04/30/2021  ? Substance induced mood disorder ([place]) 03/22/2021  ?  Generalized anxiety disorder 03/22/2021  ? 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 DKA (diabetic ketoacidosis) ([facility]) 01/23/2021  ? Glaucoma suspect 04/12/2020  ? DKA, type 2, not at goal [facility]) 10/02/2019  ? AKI (acute kidney injury) ([place]) 10/02/2019  ? Hyperkalemia 10/02/2019  ? Leukocytosis 10/02/2019  ? Abnormal LFTs 10/02/2019  ? Stressful life events affecting family and household 08/06/2019  ? New onset type 2 diabetes mellitus ([place]) 02/04/2019  ? Obesity (BMI 35.0-39.9 without comorbidity) 02/04/2019  ? Tobacco dependence 02/04/2019  ? Left arm weakness 12/06/2014  ? Paresthesias/numbness 12/06/2014  ? Tobacco abuse 11/14/2014  ? Depression   ? Mixed incontinence 06/27/2014  ? History of TVH in 2006 for fibroids and menorrhagia; benign pathology 09/08/2011  ?  ? ?Current Outpatient Medications on File Prior to Visit  ?Medication Sig Dispense Refill  ? atorvastatin (LIPITOR) 40 MG tablet Take 1 tablet (40 mg total) by mouth daily. 90 tablet 2  ? Blood Glucose Monitoring Suppl (TRUE METRIX METER) w/Device KIT Check blood sugars three times a day 1 kit 0  ? busPIRone (BUSPAR) 15 MG tablet Take 1 tablet (15 mg total) by mouth 3 (three) times daily. 90 tablet 1  ? conjugated estrogens (PREMARIN) vaginal cream Apply 0.5 gram intravaginally 2 times a week 42.5 g 1  ? cycloSPORINE (RESTASIS) 0.05 % ophthalmic emulsion Apply 1 drop into both  eyes twice a day 180 each 3  ? Dulaglutide (TRULICITY) 4.5 XJ/8.8TG SOPN Inject 4.5 mg as directed once a week. 2 mL 3  ? DULoxetine HCl 40 MG CPEP Take 80 mg (2 capsules) by mouth daily. 60 capsule 1  ? Erenumab-aooe (AIMOVIG) 140 MG/ML SOAJ Inject 140 mg into the skin every 28 (twenty-eight) days. 1.12 mL 5  ? fluorometholone (FML) 0.1 % ophthalmic suspension Instill 1 drop into both eyes as directed 1 drop 4 times day for 2 weeks then 1 drop 2 times day for 2 weeks 10 mL 0  ? Galcanezumab-gnlm (EMGALITY) 120 MG/ML SOAJ Inject 120 mg into the skin every 28  (twenty-eight) days. 1.12 mL 5  ? glucose blood (TRUE METRIX BLOOD GLUCOSE TEST) test strip Use to check blood sugar 3 times daily 100 each 2  ? hydrOXYzine (ATARAX) 25 MG tablet TAKE 1 TABLET (25 MG TOTAL) BY MOUTH 3 (THREE) TIMES DAILY AS NEEDED. 90 tablet 1  ? insulin aspart (NOVOLOG FLEXPEN) 100 UNIT/ML FlexPen INJECT 25 UNITS BEFORE BREAKFAST, 15 UNITS BEFORE LUNCH, AND 25 UNITS BEFORE DINNER. 18 mL 2  ? Insulin Glargine (BASAGLAR KWIKPEN) 100 UNIT/ML Inject 67 Units into the skin at bedtime. 18 mL 2  ? Insulin Pen Needle (PEN NEEDLES) 31G X 6 MM MISC Use as directed 100 each 0  ? metFORMIN (GLUCOPHAGE-XR) 500 MG 24 hr tablet Take 1 tablet (500 mg total) by mouth 2 (two) times daily. 60 tablet 2  ? metoprolol tartrate (LOPRESSOR) 100 MG tablet Take 1 tablet (100 mg total) by mouth once for 1 dose. Take 90-120 minutes prior to scan. 1 tablet 0  ? metoprolol tartrate (LOPRESSOR) 25 MG tablet Take 0.5 tablets (12.5 mg total) by mouth 2 (two) times daily. 90 tablet 2  ? Multiple Vitamin (MULTIVITAMIN ADULT) TABS Take 1 tablet by mouth daily.    ? naproxen (NAPROSYN) 500 MG tablet Take 1 tablet (500 mg total) by mouth 2 (two) times daily as needed. 20 tablet 5  ? ondansetron (ZOFRAN ODT) 4 MG disintegrating tablet Take 1 tablet (4 mg total) by mouth every 8 (eight) hours as needed for nausea or vomiting. 20 tablet 0  ? QUEtiapine (SEROQUEL) 100 MG tablet Take 1 tablet (100 mg total) by mouth at bedtime. 30 tablet 1  ? SUMAtriptan (IMITREX) 100 MG tablet TAKE 1 TABLET EARLIEST ONSET OF MIGRAINE. MAY REPEAT IN 2 HOURS IF HEADACHE PERSISTS OR RECURS. MAXIMUM 2 TABLETS IN 24 HOURS. 10 tablet 5  ? TRUEPLUS LANCETS 28G MISC Check blood sugars three time a day 100 each 12  ? zolpidem (AMBIEN) 5 MG tablet Take 1 tablet (5 mg total) by mouth at bedtime as needed for sleep. 30 tablet 1  ? [DISCONTINUED] cetirizine (ZYRTEC) 10 MG tablet Take 1 tablet (10 mg total) by mouth daily. (Patient not taking: No sig reported) 30 tablet  1  ? ?No current facility-administered medications on file prior to visit.  ? ? ?Allergies  ?Allergen Reactions  ? Aspirin Hives  ? Oxycodone Nausea And Vomiting  ? ? ?Social History  ? ?Socioeconomic History  ? Marital status: Significant Other  ?  Spouse name: Not on file  ? Number of children: 3  ? Years of education: 26  ? Highest education level: Not on file  ?Occupational History  ? Not on file  ?Tobacco Use  ? Smoking status: Former  ?  Packs/day: 0.25  ?  Years: 8.00  ?  Pack years: 2.00  ?  Types: Cigarettes  ? Smokeless tobacco: Never  ?Vaping Use  ? Vaping Use: Never used  ?Substance and Sexual Activity  ? Alcohol use: Yes  ?  Comment: occasional  ? Drug use: No  ? Sexual activity: Yes  ?  Birth control/protection: Surgical  ?Other Topics Concern  ? Not on file  ?Social History Narrative  ? Patient lives at home with mother and father , one story   ? Patient has 3 children   ? Patient is single  ? Patient has 14 years of education   ? Patient is right handed   ? ?Social Determinants of Health  ? ?Financial Resource Strain: Not on file  ?Food Insecurity: Not on file  ?Transportation Needs: Not on file  ?Physical Activity: Not on file  ?Stress: Not on file  ?Social Connections: Not on file  ?Intimate Partner Violence: Not on file  ? ? ?Family History  ?Problem Relation Age of Onset  ? Diabetes Mother   ? Mental illness Mother   ? Depression Mother   ? Hypertension Mother   ? Hypertension Father   ? Diabetes Father   ? ? ?Past Surgical History:  ?Procedure Laterality Date  ? CESAREAN SECTION    ? VAGINAL HYSTERECTOMY  2006  ? Fibroids, menorrhagia, benign pathology  ? ? ?ROS: ?Review of Systems ?Negative except as stated above ? ?PHYSICAL EXAM: ?BP 128/88   Pulse (!) 110   Resp 16   Wt 230 lb 3.2 oz (104.4 kg)   SpO2 93%   BMI 36.05 kg/m?   ?Wt Readings from Last 3 Encounters:  ?04/04/22 230 lb 3.2 oz (104.4 kg)  ?03/11/22 229 lb (103.9 kg)  ?01/17/22 229 lb 6.4 oz (104.1 kg)  ? ? ?Physical  Exam ? ?General appearance - alert, well appearing, morbidly obese middle-age African-American female and in no distress ?Mental status - normal mood, behavior, speech, dress, motor activity, and thought processes ?Neck -

## 2022-04-04 NOTE — Telephone Encounter (Signed)
Per patients insurance her duloxetine doesn't require a PA if the dosing is changed from 2 40 mg pills to a 60 mg and a 20 mg tab. This information was forwarded to provider Ce Ce Penn NP and she asked writer to change and esign the rx change to her preferred pharmacy. Writer made the changes. ?

## 2022-04-04 NOTE — Patient Instructions (Signed)
Please take your medications as prescribed. ?Please stop at our pharmacy to pick up the continuous glucose monitor. ?Follow up with Lurena Joiner in 1 month.   ?

## 2022-04-04 NOTE — Telephone Encounter (Signed)
Medication change done ?

## 2022-04-05 ENCOUNTER — Ambulatory Visit: Payer: 59 | Admitting: Pharmacist

## 2022-04-08 ENCOUNTER — Telehealth: Payer: Self-pay | Admitting: Neurology

## 2022-04-08 DIAGNOSIS — R200 Anesthesia of skin: Secondary | ICD-10-CM

## 2022-04-08 NOTE — Telephone Encounter (Signed)
Called patient and she stated her left hand has been throbbing and cannot pick anything up. Patient stated that she has been having this issue for 2 weeks. Has not taken anything for it. She would like to know what she can do. ?

## 2022-04-08 NOTE — Telephone Encounter (Signed)
Patient would like to speak to someone about her Left hand. She had appt with patel for a EMG on 04-11-22 but we had to resch to 07-23-22 since patel is out on FMLA. Please call  ?

## 2022-04-09 NOTE — Telephone Encounter (Signed)
Called and spoke to Whitney Benton and informed her that we can refer her to [facility] for her EMG. Patient is ok with this being sent to [facility] and is aware they will contact her for scheduling.  ?

## 2022-04-11 ENCOUNTER — Encounter: Payer: 59 | Admitting: Neurology

## 2022-04-16 ENCOUNTER — Telehealth: Payer: Self-pay | Admitting: Neurology

## 2022-04-16 NOTE — Telephone Encounter (Signed)
Telephone call to patient, Please call [facility] to follow up. ?

## 2022-04-16 NOTE — Telephone Encounter (Signed)
Patient called and stated that she was supposed to have a referral sent to [facility] for her hand.  She was calling to follow up, no one has called her to set up that appointment. ?

## 2022-04-24 ENCOUNTER — Encounter: Payer: Self-pay | Admitting: Internal Medicine

## 2022-04-26 ENCOUNTER — Other Ambulatory Visit: Payer: Self-pay

## 2022-04-26 ENCOUNTER — Other Ambulatory Visit: Payer: Self-pay | Admitting: Internal Medicine

## 2022-04-26 DIAGNOSIS — E1165 Type 2 diabetes mellitus with hyperglycemia: Secondary | ICD-10-CM

## 2022-04-26 MED ORDER — NOVOLOG FLEXPEN 100 UNIT/ML ~~LOC~~ SOPN
PEN_INJECTOR | SUBCUTANEOUS | 2 refills | Status: DC
  Filled 2022-04-26: qty 45, 69d supply, fill #0

## 2022-04-26 NOTE — Telephone Encounter (Signed)
Requested Prescriptions  ?Pending Prescriptions Disp Refills  ?? insulin aspart (NOVOLOG FLEXPEN) 100 UNIT/ML FlexPen 15 mL 2  ?  Sig: INJECT 25 UNITS BEFORE BREAKFAST, 15 UNITS BEFORE LUNCH, AND 25 UNITS BEFORE DINNER.  ?  ? Endocrinology:  Diabetes - Insulins Failed - 04/26/2022  8:03 AM  ?  ?  Failed - HBA1C is between 0 and 7.9 and within 180 days  ?  HbA1c, POC (controlled diabetic range)  ?Date Value Ref Range Status  ?04/04/2022 11.2 (A) 0.0 - 7.0 % Final  ?   ?  ?  Passed - Valid encounter within last 6 months  ?  Recent Outpatient Visits   ?      ? 3 weeks ago Type 2 diabetes mellitus with hyperglycemia, with long-term current use of insulin ([place])  ? [place] Ladell Pier, MD  ? 1 month ago Type 2 diabetes mellitus with hyperglycemia, with long-term current use of insulin ([place])  ? [place], RPH-CPP  ? 2 months ago Type 2 diabetes mellitus with hyperglycemia, with long-term current use of insulin ([place])  ? [place], RPH-CPP  ? 3 months ago Type 2 diabetes mellitus with hyperglycemia, with long-term current use of insulin ([place])  ? [place], RPH-CPP  ? 4 months ago Type 2 diabetes mellitus with hyperglycemia, with long-term current use of insulin ([place])  ? [place] Ladell Pier, MD  ?  ?  ?Future Appointments   ?        ? In 6 days Daisy Blossom, Jarome Matin, [place]  ? In 3 months Ladell Pier, MD [place]  ?  ? ?  ?  ?  ? ? ?

## 2022-04-29 ENCOUNTER — Ambulatory Visit (HOSPITAL_COMMUNITY)
Admission: EM | Admit: 2022-04-29 | Discharge: 2022-04-29 | Disposition: A | Discharge status: Home / Self Care | Payer: 59 | Attending: Emergency Medicine | Admitting: Emergency Medicine

## 2022-04-29 ENCOUNTER — Encounter (HOSPITAL_COMMUNITY): Payer: Self-pay | Admitting: Emergency Medicine

## 2022-04-29 DIAGNOSIS — J302 Other seasonal allergic rhinitis: Secondary | ICD-10-CM | POA: Diagnosis present

## 2022-04-29 DIAGNOSIS — J029 Acute pharyngitis, unspecified: Secondary | ICD-10-CM | POA: Diagnosis not present

## 2022-04-29 DIAGNOSIS — J4521 Mild intermittent asthma with (acute) exacerbation: Secondary | ICD-10-CM | POA: Insufficient documentation

## 2022-04-29 DIAGNOSIS — J45909 Unspecified asthma, uncomplicated: Secondary | ICD-10-CM | POA: Insufficient documentation

## 2022-04-29 LAB — POCT RAPID STREP A, ED / UC: Streptococcus, Group A Screen (Direct): NEGATIVE

## 2022-04-29 MED ORDER — METHYLPREDNISOLONE SODIUM SUCC 125 MG IJ SOLR
125.0000 mg | Freq: Once | INTRAMUSCULAR | Status: AC
  Administered 2022-04-29: 125 mg via INTRAMUSCULAR

## 2022-04-29 MED ORDER — ALBUTEROL SULFATE (2.5 MG/3ML) 0.083% IN NEBU
2.5000 mg | INHALATION_SOLUTION | Freq: Once | RESPIRATORY_TRACT | Status: AC
  Administered 2022-04-29: 2.5 mg via RESPIRATORY_TRACT

## 2022-04-29 MED ORDER — ALBUTEROL SULFATE HFA 108 (90 BASE) MCG/ACT IN AERS: 2.0000 | INHALATION_SPRAY | Freq: Four times a day (QID) | RESPIRATORY_TRACT | 0 refills | Status: DC | PRN

## 2022-04-29 MED ORDER — FLUTICASONE PROPIONATE 50 MCG/ACT NA SUSP: 1.0000 | Freq: Every day | NASAL | 1 refills | Status: DC

## 2022-04-29 MED ORDER — FEXOFENADINE HCL 180 MG PO TABS: 180.0000 mg | ORAL_TABLET | Freq: Every day | ORAL | 1 refills | Status: DC

## 2022-04-29 MED ORDER — METHYLPREDNISOLONE SODIUM SUCC 125 MG IJ SOLR
INTRAMUSCULAR | Status: AC
  Filled 2022-04-29: qty 2

## 2022-04-29 MED ORDER — ALBUTEROL SULFATE (2.5 MG/3ML) 0.083% IN NEBU
INHALATION_SOLUTION | RESPIRATORY_TRACT | Status: AC
Start: 2022-04-29 — End: ?
  Filled 2022-04-29: qty 3

## 2022-04-29 NOTE — Discharge Instructions (Addendum)
Your symptoms and my physical exam findings are concerning for exacerbation of your underlying allergies.  It is important that you begin your allergy regimen now and are consistent with taking allergy medications exactly as prescribed.  Allergy medications are preventative and therefore only work well when they are taken daily, not "as needed". ?  ?Please see the list below for recommended medications, dosages and frequencies to provide relief of current symptoms:   ?  ?Solu-Medrol IM (methylprednisolone):  To quickly address your significant respiratory inflammation, you were provided with an injection of Solu-Medrol in the office today.  You should continue to feel the full benefit of the steroid for the next 4 to 6 hours.  ? ?Allegra (fexofenadine): This is an excellent second-generation antihistamine that helps to reduce respiratory inflammatory response to environmental allergens.  This medication is not known to cause daytime sleepiness so it can be taken in the daytime.  If you find that it does make you sleepy, please feel free to take it at bedtime. ?  ?Flonase (fluticasone): This is a steroid nasal spray that you use once daily, 1 spray in each nare.  This medication does not work well if you decide to use it only used as you feel you need to, it works best used on a daily basis.  After 3 to 5 days of use, you will notice significant reduction of the inflammation and mucus production that is currently being caused by exposure to allergens, whether seasonal or environmental.  The most common side effect of this medication is nosebleeds.  If you experience a nosebleed, please discontinue use for 1 week, then feel free to resume.  I have provided you with a prescription.  I have also provided you with a coupon just in case your insurance will not cover it. ? ?ProAir, Ventolin, Proventil (albuterol): This inhaled medication contains a short acting beta agonist bronchodilator.  This medication works on the  smooth muscle that opens and constricts of your airways by relaxing the muscle.  The result of relaxation of the smooth muscle is increased air movement and improved work of breathing.  This is a short acting medication that can be used every 4-6 hours as needed for increased work of breathing, shortness of breath, wheezing and excessive coughing.  I have provided you with a prescription.  ?  ?If you find that you have not had improvement of your symptoms in the next 5 to 7 days, please follow-up with your primary care provider or return here to urgent care for repeat evaluation and further recommendations. ?  ?Thank you for visiting urgent care today.  We appreciate the opportunity to participate in your care. ? ?

## 2022-04-29 NOTE — ED Triage Notes (Signed)
Pt is present today with cough, ear pain, nasal congestion. Pt sx started friday night ? ?Pt also states that she is having swelling, pain, and numbness in the finger tips. Pt sx started one months ago.  ?

## 2022-04-29 NOTE — ED Provider Notes (Signed)
?[facility] ? ? ? ?CSN: [id number] ?Arrival date & time: 04/29/22  1529 ?  ? ?HISTORY  ? ?Chief Complaint  ?Patient presents with  ? Hand Problem  ? Cough  ? Otalgia  ? Nasal Congestion  ? ?HPI ?Whitney Benton is a 51 y.o. female. Patient presents to urgent care today complaining of a 4-day history of cough, ear pain and nasal congestion.  Patient has normal vital signs on arrival today. ? ?Patient also reports a history of carpal tunnel, still waiting for an appointment with neurology for EMG testing. ? ?The history is provided by the patient.  ?Past Medical History:  ?Diagnosis Date  ? Anxiety   ? Depression   ? Diabetes mellitus without complication ([place])   ? Glaucoma suspect   ? Trichomonas infection   ? ?Patient Active Problem List  ? Diagnosis Date Noted  ? Morbid obesity ([place]) 04/04/2022  ? Former smoker 04/30/2021  ? Major depressive disorder, recurrent episode, moderate ([place]) 04/30/2021  ? Substance induced mood disorder ([place]) 03/22/2021  ? Generalized anxiety disorder 03/22/2021  ? 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 DKA (diabetic ketoacidosis) ([place]) 01/23/2021  ? Glaucoma suspect 04/12/2020  ? DKA, type 2, not at goal [facility]) 10/02/2019  ? AKI (acute kidney injury) ([place]) 10/02/2019  ? Hyperkalemia 10/02/2019  ? Leukocytosis 10/02/2019  ? Abnormal LFTs 10/02/2019  ? Stressful life events affecting family and household 08/06/2019  ? New onset type 2 diabetes mellitus ([place]) 02/04/2019  ? Obesity (BMI 35.0-39.9 without comorbidity) 02/04/2019  ? Tobacco dependence 02/04/2019  ? Left arm weakness 12/06/2014  ? Paresthesias/numbness 12/06/2014  ? Tobacco abuse 11/14/2014  ? Depression   ? Mixed incontinence 06/27/2014  ? History of TVH in 2006 for fibroids and menorrhagia; benign pathology 09/08/2011  ? ?Past Surgical History:  ?Procedure Laterality Date  ? CESAREAN SECTION    ? VAGINAL HYSTERECTOMY  2006  ? Fibroids, menorrhagia, benign pathology  ? ?OB History   ? ? Gravida  ?5  ?  Para  ?3  ? Term  ?3  ? Preterm  ?   ? AB  ?2  ? Living  ?3  ?  ? ? SAB  ?2  ? IAB  ?0  ? Ectopic  ?0  ? Multiple  ?0  ? Live Births  ?   ?   ?  ?  ? ?Home Medications   ? ?Prior to Admission medications   ?Medication Sig Start Date End Date Taking? Authorizing Provider  ?atorvastatin (LIPITOR) 40 MG tablet Take 1 tablet (40 mg total) by mouth daily. 01/17/22   Freada Bergeron, MD  ?Blood Glucose Monitoring Suppl (TRUE METRIX METER) w/Device KIT Check blood sugars three times a day 08/09/21   Ladell Pier, MD  ?busPIRone (BUSPAR) 15 MG tablet Take 1 tablet (15 mg total) by mouth 3 (three) times daily. 04/01/22   Franne Grip, NP  ?conjugated estrogens (PREMARIN) vaginal cream Apply 0.5 gram intravaginally 2 times a week 04/30/21   Ladell Pier, MD  ?Continuous Blood Gluc Sensor (FREESTYLE LIBRE SENSOR SYSTEM) MISC Change sensor Q 2 wks 04/04/22   Ladell Pier, MD  ?cycloSPORINE (RESTASIS) 0.05 % ophthalmic emulsion Apply 1 drop into both eyes twice a day 09/19/21     ?Dulaglutide (TRULICITY) 4.5 SL/3.7DS SOPN Inject 4.5 mg as directed once a week. 03/21/22   Ladell Pier, MD  ?DULoxetine (CYMBALTA) 20 MG capsule Take 1 capsule (20 mg total) by mouth daily.  04/04/22 05/04/22  Franne Grip, NP  ?DULoxetine (CYMBALTA) 60 MG capsule Take 1 capsule (60 mg total) by mouth daily. 04/04/22 05/04/22  Franne Grip, NP  ?Erenumab-aooe (AIMOVIG) 140 MG/ML SOAJ Inject 140 mg into the skin every 28 (twenty-eight) days. 03/08/22   Pieter Partridge, DO  ?fluorometholone (FML) 0.1 % ophthalmic suspension Instill 1 drop into both eyes as directed 1 drop 4 times day for 2 weeks then 1 drop 2 times day for 2 weeks 09/19/21     ?Galcanezumab-gnlm (EMGALITY) 120 MG/ML SOAJ Inject 120 mg into the skin every 28 (twenty-eight) days. 11/12/21   Pieter Partridge, DO  ?glucose blood (TRUE METRIX BLOOD GLUCOSE TEST) test strip Use to check blood sugar 3 times daily 07/06/21   Ladell Pier, MD  ?hydrOXYzine (ATARAX) 25 MG tablet  TAKE 1 TABLET (25 MG TOTAL) BY MOUTH 3 (THREE) TIMES DAILY AS NEEDED. 04/01/22   Penn, Lunette Stands, NP  ?insulin aspart (NOVOLOG FLEXPEN) 100 UNIT/ML FlexPen INJECT 25 UNITS BEFORE BREAKFAST, 15 UNITS BEFORE LUNCH, AND 25 UNITS BEFORE DINNER. 04/26/22   Ladell Pier, MD  ?Insulin Glargine (BASAGLAR KWIKPEN) 100 UNIT/ML Inject 67 Units into the skin at bedtime. 03/27/22   Ladell Pier, MD  ?Insulin Pen Needle (PEN NEEDLES) 31G X 6 MM MISC Use as directed 02/26/19   Ladell Pier, MD  ?metFORMIN (GLUCOPHAGE-XR) 500 MG 24 hr tablet Take 1 tablet (500 mg total) by mouth 2 (two) times daily. 01/28/22   Ladell Pier, MD  ?metoprolol tartrate (LOPRESSOR) 25 MG tablet Take 0.5 tablets (12.5 mg total) by mouth 2 (two) times daily. 07/16/21   Freada Bergeron, MD  ?Multiple Vitamin (MULTIVITAMIN ADULT) TABS Take 1 tablet by mouth daily.    [provider]  ?naproxen (NAPROSYN) 500 MG tablet Take 1 tablet (500 mg total) by mouth 2 (two) times daily as needed. 08/15/21   Pieter Partridge, DO  ?ondansetron (ZOFRAN ODT) 4 MG disintegrating tablet Take 1 tablet (4 mg total) by mouth every 8 (eight) hours as needed for nausea or vomiting. 08/10/20   Gareth , MD  ?QUEtiapine (SEROQUEL) 100 MG tablet Take 1 tablet (100 mg total) by mouth at bedtime. 04/01/22   Penn, Lunette Stands, NP  ?SUMAtriptan (IMITREX) 100 MG tablet TAKE 1 TABLET EARLIEST ONSET OF MIGRAINE. MAY REPEAT IN 2 HOURS IF HEADACHE PERSISTS OR RECURS. MAXIMUM 2 TABLETS IN 24 HOURS. 02/09/21 02/09/22  Pieter Partridge, DO  ?TRUEPLUS LANCETS 28G MISC Check blood sugars three time a day 02/04/19   Ladell Pier, MD  ?zolpidem (AMBIEN) 5 MG tablet Take 1 tablet (5 mg total) by mouth at bedtime as needed for sleep. 04/01/22   Penn, Lunette Stands, NP  ?cetirizine (ZYRTEC) 10 MG tablet Take 1 tablet (10 mg total) by mouth daily. ?Patient not taking: No sig reported 03/31/19 03/03/20  Charlott Rakes, MD  ? ?Family History ?Family History  ?Problem Relation Age of Onset  ?  Diabetes Mother   ? Mental illness Mother   ? Depression Mother   ? Hypertension Mother   ? Hypertension Father   ? Diabetes Father   ? ?Social History ?Social History  ? ?Tobacco Use  ? Smoking status: Former  ?  Packs/day: 0.25  ?  Years: 8.00  ?  Pack years: 2.00  ?  Types: Cigarettes  ? Smokeless tobacco: Never  ?Vaping Use  ? Vaping Use: Never used  ?Substance Use Topics  ? Alcohol use: Yes  ?  Comment:  occasional  ? Drug use: No  ? ?Allergies   ?Aspirin and Oxycodone ? ?Review of Systems ?Review of Systems ?Pertinent findings noted in history of present illness.  ? ?Physical Exam ?Triage Vital Signs ?ED Triage Vitals  ?Enc Vitals Group  ?   BP 10/19/21 0827 (!) 147/82  ?   Pulse Rate 10/19/21 0827 72  ?   Resp 10/19/21 0827 18  ?   Temp 10/19/21 0827 98.3 ?F (36.8 ?C)  ?   Temp Source 10/19/21 0827 Oral  ?   SpO2 10/19/21 0827 98 %  ?   Weight --   ?   Height --   ?   Head Circumference --   ?   Peak Flow --   ?   Pain Score 10/19/21 0826 5  ?   Pain Loc --   ?   Pain Edu? --   ?   Excl. in [place]? --   ?No data found. ? ?Updated Vital Signs ?BP 122/86   Pulse 88   Temp 98.6 ?F (37 ?C)   Resp 18   SpO2 98%  ? ?Physical Exam ?Vitals and nursing note reviewed.  ?Constitutional:   ?   General: She is not in acute distress. ?   Appearance: Normal appearance. She is not ill-appearing.  ?HENT:  ?   Head: Normocephalic and atraumatic.  ?   Salivary Glands: Right salivary gland is not diffusely enlarged or tender. Left salivary gland is not diffusely enlarged or tender.  ?   Right Ear: Ear canal and external ear normal. No drainage. A middle ear effusion is present. There is no impacted cerumen. Tympanic membrane is bulging. Tympanic membrane is not injected or erythematous.  ?   Left Ear: Ear canal and external ear normal. No drainage. A middle ear effusion is present. There is no impacted cerumen. Tympanic membrane is bulging. Tympanic membrane is not injected or erythematous.  ?   Ears:  ?   Comments: Bilateral  EACs normal, both TMs bulging with clear fluid ?   Nose: Rhinorrhea present. No nasal deformity, septal deviation, signs of injury, nasal tenderness, mucosal edema or congestion. Rhinorrhea is clear.  ?   Rig

## 2022-05-02 ENCOUNTER — Ambulatory Visit: Payer: 59 | Attending: Internal Medicine | Admitting: Pharmacist

## 2022-05-02 ENCOUNTER — Encounter: Payer: Self-pay | Admitting: Pharmacist

## 2022-05-02 VITALS — BP 108/74 | HR 74

## 2022-05-02 DIAGNOSIS — R000 Tachycardia, unspecified: Secondary | ICD-10-CM | POA: Diagnosis not present

## 2022-05-02 LAB — CULTURE, GROUP A STREP (THRC)

## 2022-05-02 NOTE — Progress Notes (Signed)
? ?  S:    ? ?No chief complaint on file. ? ? ?Whitney Benton is a 51 y.o. female who presents for hypertension evaluation, education, and management. PMH is significant for depression, migraines (Dr. Tomi Likens), mixed incontinence, DM, obesity, HL, vit D def, former smoker. Patient was referred and last seen by Primary Care Provider, Dr. Wynetta Emery, on 04/04/2022. At that visit, she was tachycardic with a pulse rate of 110. Was noted that she had not taken her metoprolol before that visit. ? ?Today, patient arrives in good spirits and presents without assistance. Denies dizziness, headache, blurred vision, swelling.  ? ?Family/Social history:  ?Fhx: DM, HTN ?Tobacco: former 0.25 PPD smoker for 8 years (2 pack years) ?Alcohol: none reported  ? ?Medication adherence reported. Patient has  taken BP medications today.  ? ?Current antihypertensives include: metoprolol tartrate 12.5 mg BID ? ?Reported home BP readings: none ? ?Patient reported dietary habits: ?- Sodium: compliant with restriction  ?- Caffeine: none  ? ?Patient-reported exercise habits: ?- None  ? ?O:  ?Vitals:  ? 05/02/22 0948  ?BP: 108/74  ?Pulse: 74  ? ? ?Last 3 Office BP readings: ?BP Readings from Last 3 Encounters:  ?04/29/22 122/86  ?04/04/22 128/88  ?03/11/22 120/76  ? ? ?BMET ?   ?Component Value Date/Time  ? NA 140 01/24/2022 1134  ? K 4.6 01/24/2022 1134  ? CL 100 01/24/2022 1134  ? CO2 25 01/24/2022 1134  ? GLUCOSE 363 (H) 01/24/2022 1134  ? GLUCOSE 236 (H) 01/26/2021 1404  ? BUN 14 01/24/2022 1134  ? CREATININE 1.04 (H) 01/24/2022 1134  ? CALCIUM 9.3 01/24/2022 1134  ? GFRNONAA >60 01/26/2021 1404  ? GFRAA >60 08/10/2020 2100  ? ? ?Renal function: ?CrCl cannot be calculated (Patient's most recent lab result is older than the maximum 21 days allowed.). ? ?Clinical ASCVD: No  ?The 10-year ASCVD risk score (Arnett DK, et al., 2019) is: 11% ?  Values used to calculate the score: ?    Age: 81 years ?    Sex: Female ?    Is Non-Hispanic African American:  Yes ?    Diabetic: Yes ?    Tobacco smoker: Yes ?    Systolic Blood Pressure: 893 mmHg ?    Is BP treated: No ?    HDL Cholesterol: 40 mg/dL ?    Total Cholesterol: 177 mg/dL ? ?A/P: ?Hypertension currently controlled on current medications. BP goal < 130/80 mmHg. Medication adherence appears appropriate. HR was within normal limits today.  ?-Continued metoprolol 12.5 mg BID.  ?-Counseled on lifestyle modifications for blood pressure control including reduced dietary sodium, increased exercise, adequate sleep. ?-Encouraged patient to check BP at home and bring log of readings to next visit. Counseled on proper use of home BP cuff.  ? ?Results reviewed and written information provided. Patient verbalized understanding of treatment plan. Total time in face-to-face counseling 15 minutes.  ? ?F/u clinic visit in 2 months for DM. ? ?Benard Halsted, PharmD, BCACP, CPP ?Clinical Pharmacist ?[place] ?[phone] ? ? ?

## 2022-05-03 LAB — HM DIABETES EYE EXAM

## 2022-05-06 ENCOUNTER — Other Ambulatory Visit: Payer: Self-pay

## 2022-05-06 ENCOUNTER — Other Ambulatory Visit: Payer: Self-pay | Admitting: Internal Medicine

## 2022-05-08 ENCOUNTER — Other Ambulatory Visit: Payer: Self-pay

## 2022-05-08 MED ORDER — METFORMIN HCL ER 500 MG PO TB24
500.0000 mg | ORAL_TABLET | Freq: Two times a day (BID) | ORAL | 2 refills | Status: DC
  Filled 2022-05-08: qty 60, 30d supply, fill #0

## 2022-05-08 NOTE — Telephone Encounter (Signed)
Requested Prescriptions  ?Pending Prescriptions Disp Refills  ?? metFORMIN (GLUCOPHAGE-XR) 500 MG 24 hr tablet 60 tablet 2  ?  Sig: Take 1 tablet (500 mg total) by mouth 2 (two) times daily.  ?  ? Endocrinology:  Diabetes - Biguanides Failed - 05/06/2022  5:21 PM  ?  ?  Failed - Cr in normal range and within 360 days  ?  Creatinine, Ser  ?Date Value Ref Range Status  ?01/24/2022 1.04 (H) 0.57 - 1.00 mg/dL Final  ?   ?  ?  Failed - HBA1C is between 0 and 7.9 and within 180 days  ?  HbA1c, POC (controlled diabetic range)  ?Date Value Ref Range Status  ?04/04/2022 11.2 (A) 0.0 - 7.0 % Final  ?   ?  ?  Failed - B12 Level in normal range and within 720 days  ?  Vitamin B-12  ?Date Value Ref Range Status  ?11/14/2014 490 211 - 911 pg/mL Final  ?  Comment:  ?  Performed at [facility]  ?   ?  ?  Failed - CBC within normal limits and completed in the last 12 months  ?  WBC  ?Date Value Ref Range Status  ?11/30/2021 6.1 3.4 - 10.8 x10E3/uL Final  ?01/25/2021 9.9 4.0 - 10.5 K/uL Final  ? ?RBC  ?Date Value Ref Range Status  ?11/30/2021 4.83 3.77 - 5.28 x10E6/uL Final  ?01/25/2021 4.26 3.87 - 5.11 MIL/uL Final  ? ?Hemoglobin  ?Date Value Ref Range Status  ?11/30/2021 13.5 11.1 - 15.9 g/dL Final  ? ?Hematocrit  ?Date Value Ref Range Status  ?11/30/2021 40.9 34.0 - 46.6 % Final  ? ?MCHC  ?Date Value Ref Range Status  ?11/30/2021 33.0 31.5 - 35.7 g/dL Final  ?01/25/2021 34.5 30.0 - 36.0 g/dL Final  ? ?MCH  ?Date Value Ref Range Status  ?11/30/2021 28.0 26.6 - 33.0 pg Final  ?01/25/2021 29.3 26.0 - 34.0 pg Final  ? ?MCV  ?Date Value Ref Range Status  ?11/30/2021 85 79 - 97 fL Final  ? ?No results found for: PLTCOUNTKUC, LABPLAT, [place] ?RDW  ?Date Value Ref Range Status  ?11/30/2021 13.8 11.7 - 15.4 % Final  ? ?  ?  ?  Passed - eGFR in normal range and within 360 days  ?  GFR calc Af Amer  ?Date Value Ref Range Status  ?08/10/2020 >60 >60 mL/min Final  ? ?GFR, Estimated  ?Date Value Ref Range Status  ?01/26/2021 >60 >60  mL/min Final  ?  Comment:  ?  (NOTE) ?Calculated using the CKD-EPI Creatinine Equation (2021) ?  ? ?eGFR  ?Date Value Ref Range Status  ?01/24/2022 65 >59 mL/min/1.73 Final  ?   ?  ?  Passed - Valid encounter within last 6 months  ?  Recent Outpatient Visits   ?      ? 6 days ago Tachycardia  ? [place], RPH-CPP  ? 1 month ago Type 2 diabetes mellitus with hyperglycemia, with long-term current use of insulin ([place])  ? [place] Karle Plumber B, MD  ? 2 months ago Type 2 diabetes mellitus with hyperglycemia, with long-term current use of insulin ([place])  ? [place], RPH-CPP  ? 3 months ago Type 2 diabetes mellitus with hyperglycemia, with long-term current use of insulin ([place])  ? [place], RPH-CPP  ?  4 months ago Type 2 diabetes mellitus with hyperglycemia, with long-term current use of insulin ([place])  ? [place], RPH-CPP  ?  ?  ?Future Appointments   ?        ? In 2 months Daisy Blossom, Jarome Matin, [place]  ? In 3 months Ladell Pier, MD [place]  ?  ? ?  ?  ?  ? ?

## 2022-05-09 ENCOUNTER — Other Ambulatory Visit: Payer: Self-pay

## 2022-05-09 MED ORDER — METOPROLOL TARTRATE 25 MG PO TABS: 12.5000 mg | ORAL_TABLET | Freq: Two times a day (BID) | ORAL | 2 refills | Status: DC

## 2022-05-10 ENCOUNTER — Encounter: Payer: Self-pay | Admitting: Internal Medicine

## 2022-05-17 ENCOUNTER — Ambulatory Visit (INDEPENDENT_AMBULATORY_CARE_PROVIDER_SITE_OTHER): Payer: 59 | Admitting: Clinical

## 2022-05-17 DIAGNOSIS — F411 Generalized anxiety disorder: Secondary | ICD-10-CM | POA: Diagnosis not present

## 2022-05-18 NOTE — Plan of Care (Signed)
  Problem: Depression CCP Problem  1  Goal: LTG: Whitney Benton WILL SCORE LESS THAN 10 ON THE PATIENT HEALTH QUESTIONNAIRE (PHQ-9) Outcome: Progressing Goal: STG: Whitney Benton WILL PARTICIPATE IN AT LEAST 80% OF SCHEDULED INDIVIDUAL PSYCHOTHERAPY SESSIONS Outcome: Progressing Goal: STG: Whitney Benton WILL COMPLETE AT LEAST 80% OF ASSIGNED HOMEWORK Outcome: Progressing   
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
## 2022-05-22 ENCOUNTER — Other Ambulatory Visit: Payer: Self-pay

## 2022-05-24 ENCOUNTER — Encounter: Payer: 59 | Attending: Internal Medicine | Admitting: Registered"

## 2022-05-24 ENCOUNTER — Encounter: Payer: Self-pay | Admitting: Registered"

## 2022-05-24 DIAGNOSIS — E118 Type 2 diabetes mellitus with unspecified complications: Secondary | ICD-10-CM | POA: Diagnosis present

## 2022-05-24 NOTE — Progress Notes (Signed)
Diabetes Self-Management Education  Visit Type: First/Initial  Appt. Start Time: 0855 Appt. End Time: 6144  05/24/2022  [name], identified by name and date of birth, is a 51 y.o. female with a diagnosis of Diabetes: Type 2.   ASSESSMENT  There were no vitals taken for this visit. There is no height or weight on file to calculate BMI.  Patient did not have any questions or goals coming into the visit.  A1c 11.2% 04/04/22) Medications:  Novolog Flexpen: 25 u before breakfast; 15 u before lunch, 25 u before dinner Basaglar kwikpen: 67 units with breakfast Metformin 315 mg bid Trulicy on Thursdays  SMBG: in the middle of moving and hasn't been checking past 2 weeks. FBS: low 200s, not checking PPBG  Sleep:  Takes Ambien qhs.  Pt reports in by at 9 pm  on iPad, asleep by 10:30 pm, wakes up 5:15 am to take care of grandson. After she gets to son's house, if grandson is still asleep she goes back to sleep until ~9 am. Pt states not restful sleep, wakes up during th night and sometimes not able to go back to sleep.   Diet: Pt states she loves fruit, eats canned fruit due to missing teeth, rinsed. eats [place] yogurt daily if has it  Although patient reports no formal diabetes education she was knowledgeable in label reading, treating hypoglycemia, and general diabetes care due to spending time with her mother, before her mother passed away.   Patient barriers to diabetes self management include:  Food access: 6x/week watching grandson at son's house and they do not have a lot of whole grains or vegetables. Pt also reports food insecurity. Dentition: patient is missing teeth which limits food options. Low energy end of day: by the time patient returns home after taking care of grandson, does not have much energy to prepare dinner.  Asked patient to return for follow-up visit and we can see how her changes are working and discuss more details how to tweak diet and lifestyle.    Diabetes Self-Management Education - 05/24/22 0909       Visit Information   Visit Type First/Initial      Initial Visit   Diabetes Type Type 2    Date Diagnosed 2020    Are you currently following a meal plan? No    Are you taking your medications as prescribed? Yes      Health Coping   How would you rate your overall health? Fair      Psychosocial Assessment   Patient Belief/Attitude about Diabetes Motivated to manage diabetes    How often do you need to have someone help you when you read instructions, pamphlets, or other written materials from your doctor or pharmacy? 4 - Often    What is the last grade level you completed in school? 12      Complications   Last HgB A1C per patient/outside source 11.2 %    How often do you check your blood sugar? 0 times/day (not testing)    Number of hypoglycemic episodes per month 1   "sometimes" at nighttime has hypoglycemic sxs - happened a couple of days ago   Can you tell when your blood sugar is low? Yes    What do you do if your blood sugar is low? 8 oz apple juice, layed down for a little while then had protein    Have you had a dilated eye exam in the past 12 months? Yes  Have you had a dental exam in the past 12 months? No    Are you checking your feet? Yes    How many days per week are you checking your feet? 7      Dietary Intake   Breakfast boiled egg OR cereal OR coffee decaf, sausage, egg mcmuffin    Snack (morning) bag of chips    Lunch egg salad sandwich, a little bit of punch    Snack (afternoon) none    Dinner chicken pot pie, crystal light, ice cream    Snack (evening) none    Beverage(s) water with crystal light, decaf coffee, sugar-free natural twist strawberry lemon      Activity / Exercise   Activity / Exercise Type ADL's      Patient Education   Previous Diabetes Education No    Healthy Eating Role of diet in the treatment of diabetes and the relationship between the three main macronutrients and blood  glucose level;Plate Method;Carbohydrate counting   identifying carbs   Being Active Role of exercise on diabetes management, blood pressure control and cardiac health.    Medications Taught/reviewed insulin/injectables, injection, site rotation, insulin/injectables storage and needle disposal.    Monitoring Identified appropriate SMBG and/or A1C goals.    Chronic complications Dental care;Assessed and discussed foot care and prevention of foot problems      Individualized Goals (developed by patient)   Medications Other (comment)   consistent carb intake to match set dose for meal insulin   Monitoring  Test my blood glucose as discussed    Reducing Risk treat hypoglycemia with 15 grams of carbs if blood glucose less than '70mg'$ /dL      Outcomes   Expected Outcomes Demonstrated interest in learning. Expect positive outcomes    Future DMSE 2 months    Program Status Not Completed            Individualized Plan for Diabetes Self-Management Training:   Learning Objective:  Patient will have a greater understanding of diabetes self-management. Patient education plan is to attend individual and/or group sessions per assessed needs and concerns.   Patient Instructions  Food resources:  Google search Little green books and Little blue books for list of locations Out of the Garden is a mobile resource that has updated locations each month: InformationDoor.it  Insulin: Talk to your doctor about your evening low blood sugar symptoms (start checking your blood sugar when having symptoms). You may need to adjust your evening dose of insulin depending on your meal.   Diet: Eat more vegetables, when needing convenience the healthy choice meals are a good option.  Continue include whole grains.  Read nutrition facts labels.  Exercise:  Talk grandson out for a walk in the morning.  Expected Outcomes:  Demonstrated  interest in learning. Expect positive outcomes  Education material provided: My Plate and Carbohydrate counting sheet  If problems or questions, patient to contact team via:  Phone (Pt states she can't use MyChart)  Future DSME appointment: 2 months

## 2022-05-24 NOTE — Patient Instructions (Addendum)
Food resources:  Producer, television/film/video Little green books and Little blue books for list of locations [employer] is a Pensions consultant that has updated locations each month: InformationDoor[employer]it  Insulin: Talk to your doctor about your evening low blood sugar symptoms (start checking your blood sugar when having symptoms). You may need to adjust your evening dose of insulin depending on your meal.   Diet: Eat more vegetables, when needing convenience the healthy choice meals are a good option.  Continue include whole grains.  Read nutrition facts labels.  Exercise:  Talk grandson out for a walk in the morning.

## 2022-05-28 ENCOUNTER — Other Ambulatory Visit: Payer: Self-pay

## 2022-05-29 ENCOUNTER — Other Ambulatory Visit: Payer: Self-pay

## 2022-05-30 ENCOUNTER — Other Ambulatory Visit: Payer: Self-pay

## 2022-05-31 ENCOUNTER — Ambulatory Visit (HOSPITAL_COMMUNITY): Payer: 59 | Admitting: Clinical

## 2022-06-03 ENCOUNTER — Other Ambulatory Visit: Payer: Self-pay

## 2022-06-11 ENCOUNTER — Other Ambulatory Visit: Payer: Self-pay

## 2022-06-11 ENCOUNTER — Telehealth (HOSPITAL_COMMUNITY): Payer: Self-pay | Admitting: *Deleted

## 2022-06-11 ENCOUNTER — Ambulatory Visit (AMBULATORY_SURGERY_CENTER): Payer: Self-pay | Admitting: *Deleted

## 2022-06-11 VITALS — Ht 67.0 in | Wt 224.0 lb

## 2022-06-11 DIAGNOSIS — Z800 Family history of malignant neoplasm of digestive organs: Secondary | ICD-10-CM

## 2022-06-11 DIAGNOSIS — Z1211 Encounter for screening for malignant neoplasm of colon: Secondary | ICD-10-CM

## 2022-06-11 MED ORDER — PEG 3350-KCL-NA BICARB-NACL 420 G PO SOLR: 4000.0000 mL | Freq: Once | ORAL | 0 refills | Status: AC

## 2022-06-11 NOTE — Progress Notes (Signed)
Patient is here in-person for PV. Patient denies any allergies to eggs or soy. Patient denies any problems with anesthesia/sedation. Patient is not on any oxygen at home. Patient is not taking any diet/weight loss medications or blood thinners. Patient is aware of our care-partner policy.  EMMI education assigned to the patient for the procedure, sent to [place].

## 2022-06-11 NOTE — Telephone Encounter (Signed)
PATIENT CALLED TO INFORM THAT SHE'S PREGNANT & WANTED TO ASK IF ANY OF HER MED'S WOULD BE HARMFUL TO THE BABY? & THAT SHE TOOK A HOME TEST & HAS A APPOINTMENT TO SEE HER  [facility] . PATIENT DOES HAVE A SCHEDULED APPOINTMENT ON  06/26/22 BUT SAID SHE WOULD COME TO THE WALK-IN CLINIC TO DISCUSS HER CONCERNS.

## 2022-06-11 NOTE — Telephone Encounter (Signed)
PLEASE DISREGARD PREVIOUS NOTE

## 2022-06-18 ENCOUNTER — Telehealth (HOSPITAL_COMMUNITY): Payer: Self-pay | Admitting: *Deleted

## 2022-06-18 ENCOUNTER — Other Ambulatory Visit (HOSPITAL_COMMUNITY): Payer: Self-pay | Admitting: Psychiatry

## 2022-06-18 MED ORDER — DULOXETINE HCL 20 MG PO CPEP: 20.0000 mg | ORAL_CAPSULE | Freq: Every day | ORAL | 3 refills | Status: DC

## 2022-06-18 MED ORDER — DULOXETINE HCL 60 MG PO CPEP: 60.0000 mg | ORAL_CAPSULE | Freq: Every day | ORAL | 3 refills | Status: DC

## 2022-06-20 ENCOUNTER — Other Ambulatory Visit (HOSPITAL_COMMUNITY): Payer: Self-pay

## 2022-06-24 ENCOUNTER — Encounter (HOSPITAL_COMMUNITY): Payer: Self-pay | Admitting: Psychiatry

## 2022-06-24 ENCOUNTER — Telehealth (HOSPITAL_COMMUNITY): Payer: 59 | Admitting: Psychiatry

## 2022-06-24 ENCOUNTER — Telehealth (INDEPENDENT_AMBULATORY_CARE_PROVIDER_SITE_OTHER): Payer: 59 | Admitting: Psychiatry

## 2022-06-24 DIAGNOSIS — F411 Generalized anxiety disorder: Secondary | ICD-10-CM

## 2022-06-24 DIAGNOSIS — F5101 Primary insomnia: Secondary | ICD-10-CM | POA: Diagnosis not present

## 2022-06-24 DIAGNOSIS — F1994 Other psychoactive substance use, unspecified with psychoactive substance-induced mood disorder: Secondary | ICD-10-CM

## 2022-06-24 MED ORDER — DULOXETINE HCL 60 MG PO CPEP: 60.0000 mg | ORAL_CAPSULE | Freq: Every day | ORAL | 3 refills | Status: DC

## 2022-06-24 MED ORDER — HYDROXYZINE HCL 25 MG PO TABS: ORAL_TABLET | Freq: Three times a day (TID) | ORAL | 3 refills | Status: DC | PRN

## 2022-06-24 MED ORDER — GABAPENTIN 300 MG PO CAPS: 300.0000 mg | ORAL_CAPSULE | Freq: Three times a day (TID) | ORAL | 3 refills | Status: DC

## 2022-06-24 MED ORDER — QUETIAPINE FUMARATE 100 MG PO TABS: 100.0000 mg | ORAL_TABLET | Freq: Every day | ORAL | 3 refills | Status: DC

## 2022-06-24 MED ORDER — DULOXETINE HCL 20 MG PO CPEP: 20.0000 mg | ORAL_CAPSULE | Freq: Every day | ORAL | 3 refills | Status: DC

## 2022-06-24 MED ORDER — BUSPIRONE HCL 15 MG PO TABS: 15.0000 mg | ORAL_TABLET | Freq: Three times a day (TID) | ORAL | 3 refills | Status: DC

## 2022-06-24 NOTE — Progress Notes (Signed)
[place] MD/PA/NP OP Progress Note Virtual Visit via Telephone Note  I connected with Whitney Benton on 06/24/22 at  3:00 PM EDT by telephone and verified that I am speaking with the correct person using two identifiers.  Location: Patient: home Provider: Clinic   I discussed the limitations, risks, security and privacy concerns of performing an evaluation and management service by telephone and the availability of in person appointments. I also discussed with the patient that there may be a patient responsible charge related to this service. The patient expressed understanding and agreed to proceed.   I provided 30 minutes of non-face-to-face time during this encounter.     06/24/2022 12:11 PM Whitney Benton  MRN:  [id number]  Chief Complaint: "I'm not sleeping again"  HPI: 51 year old female seen today for follow up psychiatric evaluation.  She has a psychiatric history of anxiety and depression.  She is currently managed on Seroquel 100 mg nightly, hydroxyzine 50 mg nightly, hydroxyzine 25 mg 3 times daily, Ambien 5 mg nightly, BuSpar 15 mg 3 times daily and Cymbalta 60 mg daily. Today she notes her medications are somewhat effective in managing her psychiatric condition.  Today was unable to logon virtually so assessment was done over the phone.  During exam she was pleasant, cooperative, and engaged in conversation.  She informed Probation officer that since her last visit she has not been able to sleep more than 6 hours.  She denies any new life stressors.  She did Artist that she continues to grieve the loss of her mother who passed away in December 24, 2021.  She also notes that she misses her father who is also deceased.  Patient reports that her anxiety and depression continues to to be bothersome.  Provider conducted a GAD-7 and patient scored a 20.  Provider also conducted a PHQ-9 and patient scored a 20.  She endorses fluctuations in appetite but denies weight gain/loss.  Today she denies  SI/HI/AVH and mania.  Patient notes at times she feels paranoid.  She informed Probation officer that she has been enjoying spending time with her 63-month-year-old grandson.    Patient informed that she has continuous pain in her hands which she quantifies as an 8 out of 10.  At this time she is only using Cymbalta to help manage pain.  Today she is agreeable to start gabapentin 300 mg 3 times daily to help manage anxiety, mood, pain and sleep.  Potential side effects of medication and risks vs benefits of treatment vs non-treatment were explained and discussed. All questions were answered. She reports that Ambien has been ineffective over the last few months and would like to discontinue it today.  Provider discussed increasing Seroquel at next visit if mood, anxiety, and sleep does not improve. She will follow-up with outpatient counseling for therapy.  No other concerns noted at this time.         . Visit Diagnosis:    ICD-10-CM   1. Substance induced mood disorder (HCC)  F19.94 QUEtiapine (SEROQUEL) 100 MG tablet    DULoxetine (CYMBALTA) 20 MG capsule    DULoxetine (CYMBALTA) 60 MG capsule    gabapentin (NEURONTIN) 300 MG capsule    busPIRone (BUSPAR) 15 MG tablet    2. Generalized anxiety disorder  F41.1 QUEtiapine (SEROQUEL) 100 MG tablet    DULoxetine (CYMBALTA) 20 MG capsule    DULoxetine (CYMBALTA) 60 MG capsule    hydrOXYzine (ATARAX) 25 MG tablet    gabapentin (NEURONTIN) 300 MG capsule  busPIRone (BUSPAR) 15 MG tablet    3. Primary insomnia  F51.01 QUEtiapine (SEROQUEL) 100 MG tablet      Past Psychiatric History: anxiety and depression  Past Medical History:  Past Medical History:  Diagnosis Date   Anxiety    Asthma    Depression    Diabetes mellitus without complication ([place])    Glaucoma suspect    Trichomonas infection     Past Surgical History:  Procedure Laterality Date   CESAREAN SECTION     UPPER GASTROINTESTINAL ENDOSCOPY     VAGINAL HYSTERECTOMY  12/23/2004    Fibroids, menorrhagia, benign pathology    Family Psychiatric History: Mother schizophrenia and bipolar disorder  Family History:  Family History  Problem Relation Age of Onset   Diabetes Mother    Mental illness Mother    Depression Mother    Hypertension Mother    Colon cancer Father 69   Hypertension Father    Diabetes Father    Colon cancer Paternal Grandmother    Esophageal cancer Neg Hx    Stomach cancer Neg Hx     Social History:  Social History   Socioeconomic History   Marital status: Significant Other    Spouse name: Not on file   Number of children: 3   Years of education: 14   Highest education level: Not on file  Occupational History   Not on file  Tobacco Use   Smoking status: Former    Packs/day: 0.25    Years: 8.00    Total pack years: 2.00    Types: Cigarettes   Smokeless tobacco: Never  Vaping Use   Vaping Use: Never used  Substance and Sexual Activity   Alcohol use: Not Currently    Comment: occasional   Drug use: No   Sexual activity: Yes    Birth control/protection: Surgical  Other Topics Concern   Not on file  Social History Narrative   Patient lives at home with mother and father , one story    Patient has 3 children    Patient is single   Patient has 14 years of education    Patient is right handed    Social Determinants of Health   Financial Resource Strain: Not on file  Food Insecurity: Food Insecurity Present (05/24/2022)   Hunger Vital Sign    Worried About Running Out of Food in the Last Year: Sometimes true    Ran Out of Food in the Last Year: Sometimes true  Transportation Needs: No Transportation Needs (01/30/2021)   PRAPARE - Hydrologist (Medical): No    Lack of Transportation (Non-Medical): No  Physical Activity: Not on file  Stress: Not on file  Social Connections: Not on file    Allergies:  Allergies  Allergen Reactions   Aspirin Hives   Oxycodone Nausea And Vomiting     Metabolic Disorder Labs: Lab Results  Component Value Date   HGBA1C 11.2 (A) 04/04/2022   MPG 380.93 10/02/2019   MPG 289.09 01/06/2019   No results found for: "PROLACTIN" Lab Results  Component Value Date   CHOL 177 11/30/2021   TRIG 181 (H) 11/30/2021   HDL 40 11/30/2021   CHOLHDL 4.4 11/30/2021   VLDL 19 11/14/2014   LDLCALC 105 (H) 11/30/2021   LDLCALC 92 02/04/2020   Lab Results  Component Value Date   TSH 2.460 04/30/2021   TSH 0.014 (L) 10/02/2019    Therapeutic Level Labs: No results found for: "LITHIUM" No  results found for: "VALPROATE" No results found for: "CBMZ"  Current Medications: Current Outpatient Medications  Medication Sig Dispense Refill   gabapentin (NEURONTIN) 300 MG capsule Take 1 capsule (300 mg total) by mouth 3 (three) times daily. 90 capsule 3   albuterol (VENTOLIN HFA) 108 (90 Base) MCG/ACT inhaler Inhale 2 puffs into the lungs every 6 (six) hours as needed for wheezing or shortness of breath (Cough). 18 g 0   atorvastatin (LIPITOR) 40 MG tablet Take 1 tablet (40 mg total) by mouth daily. 90 tablet 2   Blood Glucose Monitoring Suppl (TRUE METRIX METER) w/Device KIT Check blood sugars three times a day 1 kit 0   busPIRone (BUSPAR) 15 MG tablet Take 1 tablet (15 mg total) by mouth 3 (three) times daily. 90 tablet 3   conjugated estrogens (PREMARIN) vaginal cream Apply 0.5 gram intravaginally 2 times a week 42.5 g 1   Continuous Blood Gluc Sensor (FREESTYLE LIBRE SENSOR SYSTEM) MISC Change sensor Q 2 wks 2 each 12   cycloSPORINE (RESTASIS) 0.05 % ophthalmic emulsion Apply 1 drop into both eyes twice a day 180 each 3   Dulaglutide (TRULICITY) 4.5 GY/1.7CB SOPN Inject 4.5 mg as directed once a week. 2 mL 3   DULoxetine (CYMBALTA) 20 MG capsule Take 1 capsule (20 mg total) by mouth daily. 30 capsule 3   DULoxetine (CYMBALTA) 60 MG capsule Take 1 capsule (60 mg total) by mouth daily. 30 capsule 3   Erenumab-aooe (AIMOVIG) 140 MG/ML SOAJ Inject 140  mg into the skin every 28 (twenty-eight) days. 1.12 mL 5   fexofenadine (ALLEGRA) 180 MG tablet Take 1 tablet (180 mg total) by mouth daily. 90 tablet 1   fluorometholone (FML) 0.1 % ophthalmic suspension Instill 1 drop into both eyes as directed 1 drop 4 times day for 2 weeks then 1 drop 2 times day for 2 weeks 10 mL 0   fluticasone (FLONASE) 50 MCG/ACT nasal spray Place 1 spray into both nostrils daily. Begin by using 2 sprays in each nare daily for 3 to 5 days, then decrease to 1 spray in each nare daily. 32 mL 1   Galcanezumab-gnlm (EMGALITY) 120 MG/ML SOAJ Inject 120 mg into the skin every 28 (twenty-eight) days. 1.12 mL 5   glucose blood (TRUE METRIX BLOOD GLUCOSE TEST) test strip Use to check blood sugar 3 times daily 100 each 2   hydrOXYzine (ATARAX) 25 MG tablet TAKE 1 TABLET (25 MG TOTAL) BY MOUTH 3 (THREE) TIMES DAILY AS NEEDED. 90 tablet 3   insulin aspart (NOVOLOG FLEXPEN) 100 UNIT/ML FlexPen INJECT 25 UNITS BEFORE BREAKFAST, 15 UNITS BEFORE LUNCH, AND 25 UNITS BEFORE DINNER. 15 mL 2   Insulin Glargine (BASAGLAR KWIKPEN) 100 UNIT/ML Inject 67 Units into the skin at bedtime. (Patient taking differently: Inject 67 Units into the skin daily.) 18 mL 2   Insulin Pen Needle (PEN NEEDLES) 31G X 6 MM MISC Use as directed 100 each 0   metFORMIN (GLUCOPHAGE-XR) 500 MG 24 hr tablet Take 1 tablet (500 mg total) by mouth 2 (two) times daily. 60 tablet 2   metoprolol tartrate (LOPRESSOR) 25 MG tablet Take 0.5 tablets (12.5 mg total) by mouth 2 (two) times daily. 90 tablet 2   Multiple Vitamin (MULTIVITAMIN ADULT) TABS Take 1 tablet by mouth daily.     Multiple Vitamins-Minerals (EYE VITAMINS PO) Take by mouth.     naproxen (NAPROSYN) 500 MG tablet Take 1 tablet (500 mg total) by mouth 2 (two) times daily as needed.  20 tablet 5   ondansetron (ZOFRAN ODT) 4 MG disintegrating tablet Take 1 tablet (4 mg total) by mouth every 8 (eight) hours as needed for nausea or vomiting. 20 tablet 0   QUEtiapine  (SEROQUEL) 100 MG tablet Take 1 tablet (100 mg total) by mouth at bedtime. 30 tablet 3   SUMAtriptan (IMITREX) 100 MG tablet TAKE 1 TABLET EARLIEST ONSET OF MIGRAINE. MAY REPEAT IN 2 HOURS IF HEADACHE PERSISTS OR RECURS. MAXIMUM 2 TABLETS IN 24 HOURS. 10 tablet 5   TRUEPLUS LANCETS 28G MISC Check blood sugars three time a day 100 each 12   zolpidem (AMBIEN) 5 MG tablet Take 1 tablet (5 mg total) by mouth at bedtime as needed for sleep. 30 tablet 1   No current facility-administered medications for this visit.     Musculoskeletal: Strength & Muscle Tone:  Unable to assess due to telephone visit Gait & Station:  Unable to assess due to telephone visit Patient leans: N/A  Psychiatric Specialty Exam: Review of Systems  There were no vitals taken for this visit.There is no height or weight on file to calculate BMI.  General Appearance:  Unable to assess due to telephone visit  Eye Contact:   Unable to assess due to telephone visit  Speech:  Clear and Coherent  Volume:  Normal  Mood:  Anxious and Depressed  Affect:  Appropriate and Congruent  Thought Process:  Coherent, Goal Directed, and Linear  Orientation:  Full (Time, Place, and Person)  Thought Content: Logical and Paranoid Ideation   Suicidal Thoughts:  No  Homicidal Thoughts:  No  Memory:  Immediate;   Good Recent;   Good Remote;   Good  Judgement:  Good  Insight:  Good  Psychomotor Activity:   Unable to assess due to telephone visit  Concentration:  Concentration: Good and Attention Span: Good  Recall:  Good  Fund of Knowledge: Good  Language: Good  Akathisia:  No  Handed:  Right  AIMS (if indicated): not done  Assets:  Communication Skills Desire for Improvement Housing Intimacy Physical Health Social Support  ADL's:  Intact  Cognition: WNL  Sleep:  Fair   Screenings: GAD-7    Flowsheet Row Video Visit from 06/24/2022 in [facility] Office Visit from 04/04/2022 in [place] Video Visit from 01/16/2022 in [facility] Office Visit from 11/30/2021 in [place] Video Visit from 10/23/2021 in [facility]  Total GAD-7 Score 20 21 19 21 19       PHQ2-9    Flowsheet Row Video Visit from 06/24/2022 in [facility] Office Visit from 04/04/2022 in [place] Video Visit from 01/16/2022 in [facility] Office Visit from 11/30/2021 in [place]  PHQ-2 Total Score 6 0 4 6 6   PHQ-9 Total Score 20 -- 18 23 21       Flowsheet Row ED from 04/29/2022 in [facility] from 03/22/2021 in [facility] ED from 02/05/2021 in [place] No Risk No Risk No Risk        Assessment and Plan: Patient endorses symptoms of anxiety, depression, poor sleep, and pain. At this time she is only using Cymbalta to help manage pain.  Today she  is agreeable to start gabapentin 300 mg 3 times daily to help manage anxiety, mood, pain and sleep.  She reports that Ambien has been ineffective over the last few months and would like to discontinue it today.  Provider discussed increasing Seroquel at next visit if mood, anxiety, and sleep does not improve.  1. Substance induced mood disorder (HCC)  Continue- QUEtiapine (SEROQUEL) 100 MG tablet; Take 1 tablet (100 mg total) by mouth at bedtime.  Dispense: 30 tablet; Refill: 3 Continue- DULoxetine (CYMBALTA) 20 MG capsule; Take 1 capsule (20 mg total) by mouth daily.  Dispense: 30 capsule; Refill: 3 Continue- DULoxetine (CYMBALTA) 60 MG capsule; Take 1 capsule (60 mg total) by mouth daily.  Dispense: 30 capsule; Refill: 3 Continue- gabapentin (NEURONTIN)  300 MG capsule; Take 1 capsule (300 mg total) by mouth 3 (three) times daily.  Dispense: 90 capsule; Refill: 3 Continue- busPIRone (BUSPAR) 15 MG tablet; Take 1 tablet (15 mg total) by mouth 3 (three) times daily.  Dispense: 90 tablet; Refill: 3  2. Generalized anxiety disorder  Continue- QUEtiapine (SEROQUEL) 100 MG tablet; Take 1 tablet (100 mg total) by mouth at bedtime.  Dispense: 30 tablet; Refill: 3 Continue- DULoxetine (CYMBALTA) 20 MG capsule; Take 1 capsule (20 mg total) by mouth daily.  Dispense: 30 capsule; Refill: 3 Continue- DULoxetine (CYMBALTA) 60 MG capsule; Take 1 capsule (60 mg total) by mouth daily.  Dispense: 30 capsule; Refill: 3 Continue- hydrOXYzine (ATARAX) 25 MG tablet; TAKE 1 TABLET (25 MG TOTAL) BY MOUTH 3 (THREE) TIMES DAILY AS NEEDED.  Dispense: 90 tablet; Refill: 3 Start- gabapentin (NEURONTIN) 300 MG capsule; Take 1 capsule (300 mg total) by mouth 3 (three) times daily.  Dispense: 90 capsule; Refill: 3 Continue- busPIRone (BUSPAR) 15 MG tablet; Take 1 tablet (15 mg total) by mouth 3 (three) times daily.  Dispense: 90 tablet; Refill: 3  3. Primary insomnia  Continue- QUEtiapine (SEROQUEL) 100 MG tablet; Take 1 tablet (100 mg total) by mouth at bedtime.  Dispense: 30 tablet; Refill: 3     Follow-up in 3 months Follow-up with therapy   Salley Slaughter, NP 06/24/2022, 12:11 PM

## 2022-06-25 ENCOUNTER — Other Ambulatory Visit: Payer: Self-pay | Admitting: Internal Medicine

## 2022-06-25 DIAGNOSIS — E1165 Type 2 diabetes mellitus with hyperglycemia: Secondary | ICD-10-CM

## 2022-06-26 ENCOUNTER — Other Ambulatory Visit: Payer: Self-pay

## 2022-06-26 MED ORDER — BASAGLAR KWIKPEN 100 UNIT/ML ~~LOC~~ SOPN
67.0000 [IU] | PEN_INJECTOR | Freq: Every day | SUBCUTANEOUS | 2 refills | Status: DC
  Filled 2022-06-26: qty 45, 67d supply, fill #0
  Filled 2022-08-12: qty 9, 13d supply, fill #1

## 2022-06-26 NOTE — Telephone Encounter (Signed)
Requested Prescriptions  Pending Prescriptions Disp Refills  . Insulin Glargine (BASAGLAR KWIKPEN) 100 UNIT/ML 18 mL 2    Sig: Inject 67 Units into the skin at bedtime.     Endocrinology:  Diabetes - Insulins Failed - 06/25/2022  8:30 PM      Failed - HBA1C is between 0 and 7.9 and within 180 days    HbA1c, POC (controlled diabetic range)  Date Value Ref Range Status  04/04/2022 11.2 (A) 0.0 - 7.0 % Final         Passed - Valid encounter within last 6 months    Recent Outpatient Visits          1 month ago [place], Annie Main L, RPH-CPP   2 months ago Type 2 diabetes mellitus with hyperglycemia, with long-term current use of insulin [facility])   [place] Karle Plumber B, MD   3 months ago Type 2 diabetes mellitus with hyperglycemia, with long-term current use of insulin [facility])   [place], Annie Main L, RPH-CPP   4 months ago Type 2 diabetes mellitus with hyperglycemia, with long-term current use of insulin [facility])   [place], Annie Main L, RPH-CPP   6 months ago Type 2 diabetes mellitus with hyperglycemia, with long-term current use of insulin [facility])   [place], Jarome Matin, RPH-CPP      Future Appointments            In 3 weeks Tresa Endo, [place]   In 1 month Wynetta Emery, Dalbert Batman, MD [place]

## 2022-06-28 ENCOUNTER — Other Ambulatory Visit: Payer: Self-pay

## 2022-07-02 ENCOUNTER — Telehealth: Payer: Self-pay | Admitting: Internal Medicine

## 2022-07-02 NOTE — Telephone Encounter (Signed)
Called pt, no answer, [facility]

## 2022-07-02 NOTE — Telephone Encounter (Signed)
LM to return call.

## 2022-07-03 ENCOUNTER — Other Ambulatory Visit: Payer: Self-pay | Admitting: Internal Medicine

## 2022-07-03 ENCOUNTER — Other Ambulatory Visit: Payer: Self-pay

## 2022-07-03 DIAGNOSIS — E1165 Type 2 diabetes mellitus with hyperglycemia: Secondary | ICD-10-CM

## 2022-07-03 MED ORDER — NOVOLOG FLEXPEN 100 UNIT/ML ~~LOC~~ SOPN
PEN_INJECTOR | SUBCUTANEOUS | 2 refills | Status: DC
  Filled 2022-07-03: qty 15, 20d supply, fill #0

## 2022-07-03 NOTE — Telephone Encounter (Signed)
Pt has been started on Gabapentin 600 mg - can she take this with no issues - instructed yes can take with no issues

## 2022-07-04 ENCOUNTER — Encounter: Payer: Self-pay | Admitting: Internal Medicine

## 2022-07-09 ENCOUNTER — Other Ambulatory Visit: Payer: Self-pay

## 2022-07-09 ENCOUNTER — Ambulatory Visit (AMBULATORY_SURGERY_CENTER): Payer: 59 | Admitting: Internal Medicine

## 2022-07-09 ENCOUNTER — Encounter: Payer: Self-pay | Admitting: Internal Medicine

## 2022-07-09 VITALS — BP 118/76 | HR 93 | Temp 97.3°F | Resp 17 | Ht 67.0 in | Wt 224.0 lb

## 2022-07-09 DIAGNOSIS — Z800 Family history of malignant neoplasm of digestive organs: Secondary | ICD-10-CM

## 2022-07-09 DIAGNOSIS — K635 Polyp of colon: Secondary | ICD-10-CM

## 2022-07-09 DIAGNOSIS — D120 Benign neoplasm of cecum: Secondary | ICD-10-CM

## 2022-07-09 DIAGNOSIS — Z1211 Encounter for screening for malignant neoplasm of colon: Secondary | ICD-10-CM

## 2022-07-09 MED ORDER — SODIUM CHLORIDE 0.9 % IV SOLN: 500.0000 mL | Freq: Once | INTRAVENOUS | Status: DC

## 2022-07-09 NOTE — Progress Notes (Signed)
HISTORY OF PRESENT ILLNESS:  Whitney Benton is a 51 y.o. female who is sent directly for screening colonoscopy.  No complaints.  Appropriate candidate without contraindication.  There is reported history of colon cancer in her grandmother and father  REVIEW OF SYSTEMS:  All non-GI ROS negative.  Past Medical History:  Diagnosis Date   Anxiety    Asthma    Depression    Diabetes mellitus without complication ([place])    Glaucoma suspect    Trichomonas infection     Past Surgical History:  Procedure Laterality Date   CESAREAN SECTION     UPPER GASTROINTESTINAL ENDOSCOPY     VAGINAL HYSTERECTOMY  12/23/2004   Fibroids, menorrhagia, benign pathology    Social History Whitney Benton  reports that she has quit smoking. Her smoking use included cigarettes. She has a 2.00 pack-year smoking history. She has never used smokeless tobacco. She reports that she does not currently use alcohol. She reports that she does not use drugs.  family history includes Colon cancer in her paternal grandmother; Colon cancer (age of onset: 79) in her father; Depression in her mother; Diabetes in her father and mother; Hypertension in her father and mother; Mental illness in her mother.  Allergies  Allergen Reactions   Aspirin Hives   Oxycodone Nausea And Vomiting       PHYSICAL EXAMINATION: Vital signs: BP 106/67   Pulse 97   Temp (!) 97.3 F (36.3 C)   Ht '5\' 7"'$  (1.702 m)   Wt 224 lb (101.6 kg)   SpO2 97%   BMI 35.08 kg/m  General: Well-developed, well-nourished, no acute distress HEENT: Sclerae are anicteric, conjunctiva pink. Oral mucosa intact Lungs: Clear Heart: Regular Abdomen: soft, nontender, nondistended, no obvious ascites, no peritoneal signs, normal bowel sounds. No organomegaly. Extremities: No edema Psychiatric: alert and oriented x3. Cooperative      ASSESSMENT:  Colon cancer screening Family history of colon cancer   PLAN:   Higher than average risk for colorectal  neoplasia screening colonoscopy

## 2022-07-09 NOTE — Progress Notes (Signed)
Called to room to assist during endoscopic procedure.  Patient ID and intended procedure confirmed with present staff. Received instructions for my participation in the procedure from the performing physician.  

## 2022-07-09 NOTE — Progress Notes (Signed)
Pt's states no medical or surgical changes since previsit or office visit. 

## 2022-07-09 NOTE — Op Note (Signed)
[facility] Patient Name: Whitney Benton Procedure Date: 07/09/2022 8:57 AM MRN: [id number] Endoscopist: Docia Chuck. Henrene Pastor , MD Age: 51 Referring MD:  Date of Birth: 10-11-1971 Gender: Female Account #: [id number] Procedure:                Colonoscopy with cold snare polypectomy x 1 Indications:              Colon cancer screening in patient at increased                            risk: Colorectal cancer in father (70s) and                            paternal grandfather Medicines:                Monitored Anesthesia Care Procedure:                Pre-Anesthesia Assessment:                           - Prior to the procedure, a History and Physical                            was performed, and patient medications and                            allergies were reviewed. The patient's tolerance of                            previous anesthesia was also reviewed. The risks                            and benefits of the procedure and the sedation                            options and risks were discussed with the patient.                            All questions were answered, and informed consent                            was obtained. Prior Anticoagulants: The patient has                            taken no previous anticoagulant or antiplatelet                            agents. ASA Grade Assessment: II - A patient with                            mild systemic disease. After reviewing the risks                            and benefits, the patient was deemed in  satisfactory condition to undergo the procedure.                           After obtaining informed consent, the colonoscope                            was passed under direct vision. Throughout the                            procedure, the patient's blood pressure, pulse, and                            oxygen saturations were monitored continuously. The                            Olympus CF-HQ190L  ([id number]) Colonoscope was                            introduced through the anus and advanced to the the                            cecum, identified by appendiceal orifice and                            ileocecal valve. The ileocecal valve, appendiceal                            orifice, and rectum were photographed. The quality                            of the bowel preparation was excellent. The                            colonoscopy was performed without difficulty. The                            patient tolerated the procedure well. The bowel                            preparation used was GoLYTELY via split dose                            instruction. Scope In: 9:12:35 AM Scope Out: 9:25:19 AM Scope Withdrawal Time: 0 hours 10 minutes 22 seconds  Total Procedure Duration: 0 hours 12 minutes 44 seconds  Findings:                 A 1 mm polyp was found in the cecum. The polyp was                            removed with a cold snare. Resection and retrieval                            were complete.  The exam was otherwise without abnormality on                            direct and retroflexion views. Complications:            No immediate complications. Estimated blood loss:                            None. Estimated Blood Loss:     Estimated blood loss: none. Impression:               - One 1 mm polyp in the cecum, removed with a cold                            snare. Resected and retrieved.                           - The examination was otherwise normal on direct                            and retroflexion views. Recommendation:           - Repeat colonoscopy in 5 years for surveillance                            (family history).                           - Patient has a contact number available for                            emergencies. The signs and symptoms of potential                            delayed complications were discussed with the                             patient. Return to normal activities tomorrow.                            Written discharge instructions were provided to the                            patient.                           - Resume previous diet.                           - Continue present medications.                           - Await pathology results. Docia Chuck. Henrene Pastor, MD 07/09/2022 9:32:16 AM This report has been signed electronically.

## 2022-07-09 NOTE — Patient Instructions (Signed)
Handout provided about polyps. Await pathology results.  YOU HAD AN ENDOSCOPIC PROCEDURE TODAY AT [facility]:   Refer to the procedure report that was given to you for any specific questions about what was found during the examination.  If the procedure report does not answer your questions, please call your gastroenterologist to clarify.  If you requested that your care partner not be given the details of your procedure findings, then the procedure report has been included in a sealed envelope for you to review at your convenience later.  YOU SHOULD EXPECT: Some feelings of bloating in the abdomen. Passage of more gas than usual.  Walking can help get rid of the air that was put into your GI tract during the procedure and reduce the bloating. If you had a lower endoscopy (such as a colonoscopy or flexible sigmoidoscopy) you may notice spotting of blood in your stool or on the toilet paper. If you underwent a bowel prep for your procedure, you may not have a normal bowel movement for a few days.  Please Note:  You might notice some irritation and congestion in your nose or some drainage.  This is from the oxygen used during your procedure.  There is no need for concern and it should clear up in a day or so.  SYMPTOMS TO REPORT IMMEDIATELY:  Following lower endoscopy (colonoscopy or flexible sigmoidoscopy):  Excessive amounts of blood in the stool  Significant tenderness or worsening of abdominal pains  Swelling of the abdomen that is new, acute  Fever of 100F or higher   For urgent or emergent issues, a gastroenterologist can be reached at any hour by calling [phone]. Do not use MyChart messaging for urgent concerns.    DIET:  We do recommend a small meal at first, but then you may proceed to your regular diet.  Drink plenty of fluids but you should avoid alcoholic beverages for 24 hours.  ACTIVITY:  You should plan to take it easy for the rest of today and you should  NOT DRIVE or use heavy machinery until tomorrow (because of the sedation medicines used during the test).    FOLLOW UP: Our staff will call the number listed on your records the next business day following your procedure.  We will call around 7:15- 8:00 am to check on you and address any questions or concerns that you may have regarding the information given to you following your procedure. If we do not reach you, we will leave a message.  If you develop any symptoms (ie: fever, flu-like symptoms, shortness of breath, cough etc.) before then, please call [phone].  If you test positive for Covid 19 in the 2 weeks post procedure, please call and report this information to [place].    If any biopsies were taken you will be contacted by phone or by letter within the next 1-3 weeks.  Please call us at [phone] if you have not heard about the biopsies in 3 weeks.    SIGNATURES/CONFIDENTIALITY: You and/or your care partner have signed paperwork which will be entered into your electronic medical record.  These signatures attest to the fact that that the information above on your After Visit Summary has been reviewed and is understood.  Full responsibility of the confidentiality of this discharge information lies with you and/or your care-partner.

## 2022-07-09 NOTE — Progress Notes (Signed)
PT taken to PACU. Monitors in place. VSS. Report given to RN. 

## 2022-07-10 ENCOUNTER — Telehealth: Payer: Self-pay | Admitting: *Deleted

## 2022-07-10 NOTE — Telephone Encounter (Signed)
Left message on f/u call 

## 2022-07-16 NOTE — Telephone Encounter (Signed)
error 

## 2022-07-17 ENCOUNTER — Encounter: Payer: Self-pay | Admitting: Internal Medicine

## 2022-07-18 ENCOUNTER — Encounter: Payer: Self-pay | Admitting: Pharmacist

## 2022-07-18 ENCOUNTER — Other Ambulatory Visit: Payer: Self-pay

## 2022-07-18 ENCOUNTER — Ambulatory Visit: Payer: 59 | Attending: Internal Medicine | Admitting: Pharmacist

## 2022-07-18 VITALS — BP 125/88

## 2022-07-18 DIAGNOSIS — E1165 Type 2 diabetes mellitus with hyperglycemia: Secondary | ICD-10-CM | POA: Diagnosis not present

## 2022-07-18 DIAGNOSIS — Z794 Long term (current) use of insulin: Secondary | ICD-10-CM | POA: Diagnosis not present

## 2022-07-18 MED ORDER — METFORMIN HCL ER 500 MG PO TB24: 500.0000 mg | ORAL_TABLET | Freq: Two times a day (BID) | ORAL | 1 refills | Status: DC

## 2022-07-18 MED ORDER — NOVOLOG FLEXPEN 100 UNIT/ML ~~LOC~~ SOPN
PEN_INJECTOR | SUBCUTANEOUS | 2 refills | Status: DC
  Filled 2022-07-18 – 2022-08-11 (×2): qty 15, 21d supply, fill #0
  Filled 2022-09-05: qty 15, 21d supply, fill #1

## 2022-07-18 NOTE — Progress Notes (Signed)
   S:    PCP: Dr. Wynetta Emery  No chief complaint on file.  Patient arrives in good spirit.  Presents for diabetes evaluation, education, and management. Patient was referred and last seen by Primary Care Provider on 04/04/22. I saw her on 05/02/2022 and made no med changes.    Today she reports that she's doing well. Doing a better job of taking medications daily but is not fully adherent to checking blood sugars at home or to a diabetic diet.  Family/Social History: Sxh: Former Smoker         Occasional Drinking Fhx: Mother DM, HTN, Depression         Father: DM, HTN  Insurance coverage/medication affordability: [employer]  Medication adherence reported.  Current diabetes medications include: Trulicity 4.'5mg'$  once weekly, Novolog 25 units before breakfast, 15 units before lunch, 25 units before dinner, Basaglar 67 units at bedtime, metformin 500 mg XR BID   Patient denies hypoglycemic events.  Patient reported dietary habits: Patient reported trying to eat more vegetables and less carbs. She admits that sweets are her weakness.   Patient-reported exercise habits: Patient reported not being able to do exercise at the time. She is providing care to her grandson and cannot devote time to a formal exercise regimen.    O:  Physical Exam  ROS  Home CBGs: -No meter or CGM for download  -Tells me her sugars run from 100s-300s, but she is seeing more 180s-250s since last visit. She tells me her highest sugar range occurs from 1p-3p after lunch (200-250s).   Lab Results  Component Value Date   HGBA1C 11.2 (A) 04/04/2022   There were no vitals filed for this visit.  Lipid Panel     Component Value Date/Time   CHOL 177 11/30/2021 1039   TRIG 181 (H) 11/30/2021 1039   HDL 40 11/30/2021 1039   CHOLHDL 4.4 11/30/2021 1039   CHOLHDL 3.2 11/14/2014 0707   VLDL 19 11/14/2014 0707   LDLCALC 105 (H) 11/30/2021 1039   Not checking blood sugars at home.   Clinical Atherosclerotic  Cardiovascular Disease (ASCVD): No  The 10-year ASCVD risk score (Arnett DK, et al., 2019) is: 9.8%   Values used to calculate the score:     Age: 51 years     Sex: Female     Is Non-Hispanic African American: Yes     Diabetic: Yes     Tobacco smoker: Yes     Systolic Blood Pressure: 975 mmHg     Is BP treated: No     HDL Cholesterol: 40 mg/dL     Total Cholesterol: 177 mg/dL    A/P: Diabetes longstanding currently uncontrolled. Patient is able to verbalize appropriate hypoglycemia management plan. Medication adherence appears optima. -Continued Basaglar 67 units at bedtime -Increase Novolog to 25 units before breakfast, 20 units before lunch, 25 before dinner.  -Continued Trulicity 4.'5mg'$  once weekly.  -Continue metformin 500 mg XR BID.  -Extensively discussed pathophysiology of diabetes, recommended lifestyle interventions, dietary effects on blood sugar control -Counseled on s/sx of and management of hypoglycemia -Next A1C anticipated at next visit with PCP.  Written patient instructions provided.  Total time in face to face counseling 30 minutes.   Follow up PCP visit in one month.  Benard Halsted, PharmD, [place], [place] [phone]

## 2022-07-23 ENCOUNTER — Other Ambulatory Visit: Payer: Self-pay

## 2022-07-23 ENCOUNTER — Ambulatory Visit (INDEPENDENT_AMBULATORY_CARE_PROVIDER_SITE_OTHER): Payer: Commercial Managed Care - HMO | Admitting: Neurology

## 2022-07-23 ENCOUNTER — Ambulatory Visit: Payer: Commercial Managed Care - HMO | Attending: Internal Medicine | Admitting: Pharmacist

## 2022-07-23 DIAGNOSIS — G43709 Chronic migraine without aura, not intractable, without status migrainosus: Secondary | ICD-10-CM

## 2022-07-23 DIAGNOSIS — G43009 Migraine without aura, not intractable, without status migrainosus: Secondary | ICD-10-CM | POA: Diagnosis not present

## 2022-07-23 DIAGNOSIS — E1165 Type 2 diabetes mellitus with hyperglycemia: Secondary | ICD-10-CM

## 2022-07-23 DIAGNOSIS — Z794 Long term (current) use of insulin: Secondary | ICD-10-CM | POA: Diagnosis not present

## 2022-07-23 DIAGNOSIS — G5603 Carpal tunnel syndrome, bilateral upper limbs: Secondary | ICD-10-CM

## 2022-07-23 MED ORDER — FREESTYLE LIBRE 2 SENSOR MISC
3 refills | Status: DC
  Filled 2022-07-23 – 2022-08-15 (×3): qty 2, 28d supply, fill #0
  Filled 2022-08-15: qty 1, 14d supply, fill #0
  Filled 2022-11-22: qty 1, 14d supply, fill #1
  Filled 2022-11-25: qty 2, 28d supply, fill #1
  Filled 2022-12-05 – 2022-12-18 (×5): qty 2, 28d supply, fill #2
  Filled 2023-01-13: qty 2, 28d supply, fill #3
  Filled 2023-02-10: qty 1, 14d supply, fill #4

## 2022-07-23 MED ORDER — FREESTYLE LIBRE 2 READER DEVI
0 refills | Status: DC
  Filled 2022-07-23 – 2022-11-22 (×3): qty 1, 30d supply, fill #0

## 2022-07-23 NOTE — Progress Notes (Signed)
Patient was educated on the use of the libre blood glucose meter. Reviewed necessary supplies and operation of the meter. Also reviewed goal blood glucose levels. Patient was able to demonstrate use. All questions and concerns were addressed. Insurance approves but copay is restrictive. Pt will pick-up when she has the funds to do so.   Time spent counseling: 15 minutes.  Follow-up: prn   Benard Halsted, PharmD, [place], [place] [phone]

## 2022-07-23 NOTE — Procedures (Signed)
[facility]  [address], [address]  [place], [place] [address] Tel: [phone] Fax:  [phone] Test Date:  07/23/2022  Patient: Whitney Benton DOB: November 21, 1971 Physician: Narda Amber, DO  Sex: Female Height: '5\' 7"'$  Ref Phys: Metta Clines, D.O.  ID#: [id number]   Technician:    Patient Complaints: This is a 51 year old female referred for evaluation of bilateral hand paresthesias.  NCV & EMG Findings: Extensive electrodiagnostic testing of the right upper extremity and additional studies of the left shows:  Bilateral median sensory responses show prolonged latency (R5.4, L5.3 ms) and reduced amplitude (R7.5, L7.5 V).  Bilateral ulnar sensory responses are within normal limits. Right median motor response shows prolonged latency (5.5 ms).  Left median motor response shows prolonged latency (6.7 ms) and reduced amplitude (4.6 mV).  Bilateral ulnar motor responses are within normal limits.  Chronic motor axonal loss changes are seen in bilateral abductor pollicis brevis muscles, without accompanied active denervation.    Impression: Bilateral median neuropathy at or distal to the wrist, consistent with a clinical diagnosis of carpal tunnel syndrome.  Overall, these findings are moderate-to-severe in degree electrically and worse on the left.   ___________________________ Narda Amber, DO    Nerve Conduction Studies Anti Sensory Summary Table   Stim Site NR Peak (ms) Norm Peak (ms) P-T Amp (V) Norm P-T Amp  Left Median Anti Sensory (2nd Digit)  35C  Wrist    5.3 <3.6 7.5 >15  Right Median Anti Sensory (2nd Digit)  35C  Wrist    5.4 <3.6 7.5 >15  Left Ulnar Anti Sensory (5th Digit)  35C  Wrist    2.7 <3.1 28.1 >10  Right Ulnar Anti Sensory (5th Digit)  35C  Wrist    2.5 <3.1 24.3 >10   Motor Summary Table   Stim Site NR Onset (ms) Norm Onset (ms) O-P Amp (mV) Norm O-P Amp Site1 Site2 Delta-0 (ms) Dist (cm) Vel (m/s) Norm Vel (m/s)  Left Median Motor (Abd Poll  Brev)  35C  Wrist    6.7 <4.0 4.6 >6 Elbow Wrist 5.1 27.0 53 >50  Elbow    11.8  4.2         Right Median Motor (Abd Poll Brev)  35C  Wrist    5.5 <4.0 9.1 >6 Elbow Wrist 4.8 28.0 58 >50  Elbow    10.3  8.4         Left Ulnar Motor (Abd Dig Minimi)  35C  Wrist    2.3 <3.1 9.1 >7 B Elbow Wrist 4.0 22.0 55 >50  B Elbow    6.3  9.0  A Elbow B Elbow 1.8 10.0 56 >50  A Elbow    8.1  8.7         Right Ulnar Motor (Abd Dig Minimi)  35C  Wrist    2.3 <3.1 10.2 >7 B Elbow Wrist 4.1 23.0 56 >50  B Elbow    6.4  9.9  A Elbow B Elbow 1.6 10.0 63 >50  A Elbow    8.0  9.8          EMG   Side Muscle Ins Act Fibs Psw Fasc Number Recrt Dur Dur. Amp Amp. Poly Poly. Comment  Right 1stDorInt Nml Nml Nml Nml Nml Nml Nml Nml Nml Nml Nml Nml N/A  Right Abd Poll Brev Nml Nml Nml Nml 1- Rapid Some 1+ Some 1+ Some 1+ N/A  Right PronatorTeres Nml Nml Nml Nml Nml Nml Nml Nml  Nml Nml Nml Nml N/A  Right Biceps Nml Nml Nml Nml Nml Nml Nml Nml Nml Nml Nml Nml N/A  Right Triceps Nml Nml Nml Nml Nml Nml Nml Nml Nml Nml Nml Nml N/A  Right Deltoid Nml Nml Nml Nml Nml Nml Nml Nml Nml Nml Nml Nml N/A  Left 1stDorInt Nml Nml Nml Nml Nml Nml Nml Nml Nml Nml Nml Nml N/A  Left Abd Poll Brev Nml Nml Nml Nml 2- Rapid Many 1+ Many 1+ Many 1+ N/A  Left PronatorTeres Nml Nml Nml Nml Nml Nml Nml Nml Nml Nml Nml Nml N/A  Left Biceps Nml Nml Nml Nml Nml Nml Nml Nml Nml Nml Nml Nml N/A  Left Triceps Nml Nml Nml Nml Nml Nml Nml Nml Nml Nml Nml Nml N/A  Left Deltoid Nml Nml Nml Nml Nml Nml Nml Nml Nml Nml Nml Nml N/A      Waveforms:

## 2022-07-24 ENCOUNTER — Telehealth: Payer: Self-pay

## 2022-07-24 ENCOUNTER — Other Ambulatory Visit: Payer: Self-pay

## 2022-07-24 ENCOUNTER — Telehealth: Payer: Self-pay | Admitting: Neurology

## 2022-07-24 DIAGNOSIS — G5603 Carpal tunnel syndrome, bilateral upper limbs: Secondary | ICD-10-CM

## 2022-07-24 NOTE — Telephone Encounter (Signed)
Called pt and gave results per Dr Tomi Likens. She understood and I made a order for referral to a hand specialist.

## 2022-07-24 NOTE — Progress Notes (Signed)
Abm

## 2022-07-24 NOTE — Telephone Encounter (Signed)
Patient would like a call back regarding the hand study she Is supposed to have.

## 2022-07-25 ENCOUNTER — Other Ambulatory Visit: Payer: Self-pay

## 2022-07-25 ENCOUNTER — Encounter: Payer: Self-pay | Admitting: Orthopaedic Surgery

## 2022-07-25 ENCOUNTER — Ambulatory Visit: Payer: Commercial Managed Care - HMO | Admitting: Orthopaedic Surgery

## 2022-07-25 ENCOUNTER — Telehealth: Payer: Self-pay | Admitting: Orthopaedic Surgery

## 2022-07-25 DIAGNOSIS — G5603 Carpal tunnel syndrome, bilateral upper limbs: Secondary | ICD-10-CM

## 2022-07-25 MED ORDER — GABAPENTIN 100 MG PO CAPS: 100.0000 mg | ORAL_CAPSULE | Freq: Every day | ORAL | 3 refills | Status: DC

## 2022-07-25 MED ORDER — DICLOFENAC SODIUM 75 MG PO TBEC: 75.0000 mg | DELAYED_RELEASE_TABLET | Freq: Two times a day (BID) | ORAL | 2 refills | Status: DC

## 2022-07-25 NOTE — Telephone Encounter (Signed)
Pt called and stated she had an appt with Dr. Erlinda Hong today and forgot to asked for pain medication. Pt asking for pain medication to go to [facility]. Pt phone number is [phone].

## 2022-07-25 NOTE — Progress Notes (Signed)
Office Visit Note   Patient: Whitney Benton           Date of Birth: [birth year]           MRN: [id number] Visit Date: 07/25/2022              Requested by: Pieter Partridge, DO [address] [place],  [place] [id number] PCP: Ladell Pier, MD   Assessment & Plan: Visit Diagnoses:  1. Bilateral carpal tunnel syndrome     Plan: Impression is bilateral carpal tunnel syndrome moderate to severe left greater than right.  Today, we discussed various treatment options to include bracing, cortisone injection as well as carpal tunnel release for which we recommend based on the severity of compression.  She is a type II diabetic with her last hemoglobin A1c of 11.23 months ago.  We will repeat her A1c today.  We will call her with the results and as long as these are under 8.0 we will proceed with surgery.  Today, we will provide her with with a removable wrist splint.  She will follow-up as needed otherwise.  Follow-Up Instructions: Return if symptoms worsen or fail to improve.   Orders:  No orders of the defined types were placed in this encounter.  No orders of the defined types were placed in this encounter.     Procedures: No procedures performed   Clinical Data: No additional findings.   Subjective: Chief Complaint  Patient presents with   Right Hand - Pain   Left Hand - Pain    HPI patient is a pleasant 51 year old right-hand-dominant female who comes in today with bilateral hand pain and paresthesias.  She recently underwent nerve conduction study which showed bilateral moderate to severe carpal tunnel syndrome left greater than right.  She notes that her symptoms are constant and she is now having weakness to both hands.  The paresthesias she has are throughout the median nerve distribution hands.  No previous cortisone injection to either carpal tunnel.  She has not tried bracing.  Of note she is a type II diabetic with her most recent A1c of 11.23 months  ago.  Review of Systems as detailed in HPI.  All others reviewed and are negative.   Objective: Vital Signs: There were no vitals taken for this visit.  Physical Exam well-developed and well-nourished female no acute distress.  Alert and oriented x3.  Ortho Exam bilateral hand exam shows positive Tinel at the wrist and positive Phalen.  No thenar atrophy.  Specialty Comments:  No specialty comments available.  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   DKA (diabetic ketoacidosis) ([place]) 01/23/2021   Glaucoma suspect 04/12/2020   DKA, type 2, not at goal [facility]) 10/02/2019   AKI (acute kidney injury) ([place]) 10/02/2019   Hyperkalemia 10/02/2019   Leukocytosis 10/02/2019   Abnormal LFTs 10/02/2019   Stressful life events affecting family and household 08/06/2019   New onset type 2 diabetes mellitus ([place]) 02/04/2019   Obesity (BMI 35.0-39.9 without comorbidity) 02/04/2019   Tobacco dependence 02/04/2019   Left arm weakness 12/06/2014   Paresthesias/numbness 12/06/2014   Tobacco abuse 11/14/2014   Depression  Mixed incontinence 06/27/2014   History of TVH in 2006 for fibroids and menorrhagia; benign pathology 09/08/2011   Past Medical History:  Diagnosis Date   Anxiety    Asthma    Depression    Diabetes mellitus without complication ([place])    Glaucoma suspect    Trichomonas infection     Family History  Problem Relation Age of Onset   Diabetes Mother    Mental illness Mother    Depression Mother    Hypertension Mother    Colon cancer Father 17   Hypertension Father    Diabetes Father    Colon cancer Paternal Grandmother     Esophageal cancer Neg Hx    Stomach cancer Neg Hx     Past Surgical History:  Procedure Laterality Date   CESAREAN SECTION     UPPER GASTROINTESTINAL ENDOSCOPY     VAGINAL HYSTERECTOMY  12/23/2004   Fibroids, menorrhagia, benign pathology   Social History   Occupational History   Not on file  Tobacco Use   Smoking status: Former    Packs/day: 0.25    Years: 8.00    Total pack years: 2.00    Types: Cigarettes   Smokeless tobacco: Never  Vaping Use   Vaping Use: Never used  Substance and Sexual Activity   Alcohol use: Not Currently    Comment: occasional   Drug use: No   Sexual activity: Yes    Birth control/protection: Surgical

## 2022-07-25 NOTE — Telephone Encounter (Signed)
I sent diclofenac and gabapentin

## 2022-07-26 LAB — HEMOGLOBIN A1C
Hgb A1c MFr Bld: 10.2 % of total Hgb — ABNORMAL HIGH (ref <5.7)
Mean Plasma Glucose: 246 mg/dL
eAG (mmol/L): 13.6 mmol/L

## 2022-07-26 NOTE — Telephone Encounter (Signed)
Ok thanks 

## 2022-07-29 ENCOUNTER — Other Ambulatory Visit: Payer: Self-pay

## 2022-07-29 ENCOUNTER — Encounter: Payer: Self-pay | Admitting: Registered"

## 2022-07-29 ENCOUNTER — Encounter: Payer: Commercial Managed Care - HMO | Attending: Internal Medicine | Admitting: Registered"

## 2022-07-29 DIAGNOSIS — E119 Type 2 diabetes mellitus without complications: Secondary | ICD-10-CM | POA: Diagnosis present

## 2022-07-29 NOTE — Patient Instructions (Addendum)
Resources to help with food assistance:  [place] (asimplegesturegso.org)  [place] and [place] Books were created in 2015. They may have been updated in 2018, but if you use these resources you may want to call ahead to make sure the information is still correct. Pantries: little blue book.. do a search and look for wordpress link. [place] computers block access because it is a blog site. Meals: the-little-green-book-03-23-2016.pdf (asimplegesturegso.org)  Mobile: Out of the [place] website updates their calendar monthly with times and locations  Some Farmers Markets accept EBT and sometimes you can find some that will double your benefits.  [employer] market is one that accepts EBT: [employer], [place] ([email or web address])   Exercise: Take grandson to the mall for walking out of the heat.  Diet: Your plan is to get some broccoli, spinach, grilled cauliflower, aim to eat daily. Try to get dried beans in 3 or more times per week. Get more protein for breakfast. [place] Yogurt, eggs, peanut butter on toast or rice cakes.  Blood sugar testing: download app to your phone for the Accu chek, the app is called MySugr

## 2022-07-29 NOTE — Progress Notes (Signed)
Diabetes Self-Management Education  Visit Type: Follow-up  Appt. Start Time: 1110 Appt. End Time: 1137  07/29/2022  [name], identified by name and date of birth, is a 51 y.o. female with a diagnosis of Diabetes:  .   ASSESSMENT  There were no vitals taken for this visit. There is no height or weight on file to calculate BMI.  A1c 10.2% 07/25/22, reduced from 1% since last visit Medications:  Novolog Flexpen: 25 u before breakfast; 20 u before lunch (increased 5 units since visit), 25 u before dinner Basaglar kwikpen: 67 units with breakfast Metformin Xr 500 mg bid Trulicy 4.5 mg on Thursdays   Gabapentin (stopped ambien)  SMBG: not checking, just found bag CGM Libre ready at pharmacy 8/1 but waiting to pick up due to cost.    Sleep: Pt states still can't sleep feels partly due to menopausal sxs, watches TV to help go back to sleep. stopped Ambien, taking gabapentin  Goals from last visit: Food resources: Patient didn't look into the little green/blue books or farmers markets Hypoglycemic sxs has resolved Diet: patient has not started buying more vegetables but has started eating healthy choice dinners while taking care of grandson. Exercise: Has not started taking grandson out for morning walks due to heat and is tired.   Diabetes Self-Management Education - 07/29/22 1103       Visit Information   Visit Type Follow-up      Dietary Intake   Breakfast multigrain cheerios, splenda,    Lunch none    Dinner 2 small bbq ribs, 1/2 c mac and cheese, 1/2 c greens    Beverage(s) water, sugar-free soda      Activity / Exercise   Activity / Exercise Type ADL's      Patient Education   Healthy Eating Meal options for control of blood glucose level and chronic complications.    Being Active Helped patient identify appropriate exercises in relation to his/her diabetes, diabetes complications and other health issue.      Individualized Goals (developed by patient)    Nutrition General guidelines for healthy choices and portions discussed    Physical Activity Exercise 3-5 times per week    Monitoring  Other (comment)   download app to sync meter     Outcomes   Expected Outcomes Demonstrated interest in learning. Expect positive outcomes    Future DMSE 3-4 months    Program Status Not Completed      Subsequent Visit   Since your last visit have you continued or begun to take your medications as prescribed? Yes            Individualized Plan for Diabetes Self-Management Training:   Learning Objective:  Patient will have a greater understanding of diabetes self-management. Patient education plan is to attend individual and/or group sessions per assessed needs and concerns.    Patient Instructions  Resources to help with food assistance:  [place] (asimplegesturegso.org)  [place] and [employer] were created in 2015. They may have been updated in 2018, but if you use these resources you may want to call ahead to make sure the information is still correct. Pantries: little blue book.. do a search and look for wordpress link. [place] computers block access because it is a blog site. Meals: the-little-green-book-03-23-2016.pdf (asimplegesturegso.org)  Mobile: [employer] [place] website updates their calendar monthly with times  and locations  Some Farmers Markets accept EBT and sometimes you can find some that will double your benefits.  [place] market is one that accepts EBT: [employer], [place] ([email or web address].org)   Exercise: Take grandson to the mall for walking out of the heat.  Diet: Your plan is to get some broccoli, spinach, grilled cauliflower, aim to eat daily. Try to get dried beans in 3 or more times per week. Get more protein for breakfast. [place] Yogurt, eggs, peanut  butter on toast or rice cakes.  Blood sugar testing: download app to your phone for the Accu chek, the app is called MySugr   Expected Outcomes:  Demonstrated interest in learning. Expect positive outcomes  Education material provided: [employer] [place] Aug schedule  If problems or questions, patient to contact team via:  Phone  Future DSME appointment: 3-4 months

## 2022-07-30 ENCOUNTER — Telehealth: Payer: Self-pay | Admitting: Orthopaedic Surgery

## 2022-07-30 ENCOUNTER — Other Ambulatory Visit: Payer: Self-pay

## 2022-07-30 NOTE — Telephone Encounter (Signed)
Pt calling asking about A1C results.   [phone]

## 2022-07-30 NOTE — Telephone Encounter (Signed)
10.2.  too high for surgery

## 2022-07-31 ENCOUNTER — Telehealth (HOSPITAL_COMMUNITY): Payer: Self-pay | Admitting: Pharmacy Technician

## 2022-07-31 ENCOUNTER — Other Ambulatory Visit (HOSPITAL_COMMUNITY): Payer: Self-pay

## 2022-07-31 NOTE — Telephone Encounter (Signed)
Patient Advocate Encounter   Received notification that prior authorization for Aimovig '140MG'$ /ML auto-injectors is required.   PA submitted on 07/31/2022 Key [id number] Status is pending       Lyndel Safe, [place] Patient Advocate Specialist [place] Patient Advocate Team Direct Number: [phone]  Fax: [phone]

## 2022-07-31 NOTE — Telephone Encounter (Signed)
Patient Advocate Encounter  Prior Authorization for Aimovig '140MG'$ /ML auto-injectors  has been approved.    PA# [id number] Effective dates: 07/31/2022 through 01/27/2023      Lyndel Safe, [place] Patient Advocate Specialist [place] Patient Advocate Team Direct Number: [phone]  Fax: [phone]

## 2022-07-31 NOTE — Telephone Encounter (Signed)
Patient aware of the below message  

## 2022-08-02 ENCOUNTER — Ambulatory Visit
Admission: RE | Admit: 2022-08-02 | Discharge: 2022-08-02 | Disposition: A | Discharge status: Home / Self Care | Payer: Commercial Managed Care - HMO | Source: Ambulatory Visit | Attending: Internal Medicine | Admitting: Internal Medicine

## 2022-08-02 ENCOUNTER — Ambulatory Visit (INDEPENDENT_AMBULATORY_CARE_PROVIDER_SITE_OTHER): Payer: Commercial Managed Care - HMO | Admitting: Clinical

## 2022-08-02 DIAGNOSIS — F331 Major depressive disorder, recurrent, moderate: Secondary | ICD-10-CM

## 2022-08-02 DIAGNOSIS — Z1231 Encounter for screening mammogram for malignant neoplasm of breast: Secondary | ICD-10-CM

## 2022-08-02 NOTE — Progress Notes (Unsigned)
   THERAPIST PROGRESS NOTE  Session Time: 30 minutes  Participation Level: Active  Behavioral Response: CasualAlertEuthymic  Type of Therapy: Individual Therapy  Treatment Goals addressed: client will participate in at least 80% of scheduled individual psychotherapy sessions  ProgressTowards Goals: Progressing  Interventions: CBT and Supportive  Summary:  Whitney Benton is a 51 y.o. female who presents for the scheduled appointment oriented times five, appropriately dressed, and friendly. Client denied hallucinations and delusions.  Client reported on today she has been doing fairly well.  Client reported her only with her sister has been the same.  Client reported she has to assume responsibility for making sure that the bills get paid at the house because her sister is not mindful of those things.  Client reported otherwise she was notified her Social Security hearing will be in October.  Client reported she has been going to her doctors regularly and they have determined they want her to get surgery in both of her hands for carpal tunnel.  Client reported she will have to get her A1c down before they will do the surgery.  Client reported she is doing better with making better eating choices to help.  Client reported she has been compliant with her nighttime medications but she is still having difficulty with falling and staying asleep at night.  Client reported the medications that she has been tried on are not working for her.  Client reported she has vivid nightmares/dreams which include her parents.  Client reported her grief is still ongoing but she is managing to do okay with knowing of her mother not being present. Evidence of progress towards goal: Client reported she is medication compliant 7 days out of the week.  Client reported 1 skill of self-care by improving her lifestyle changes such as eating to improve her health.    Suicidal/Homicidal: Nowithout intent/plan  Therapist  Response:  Therapist began the appointment asking the client how she has been doing since last seen. Therapist used CBT to engage using active listening and positive emotional support towards her thoughts and feelings. Therapist used CBT to ask the client about stressors at home with family that negatively impact her. Therapist used CBT to empathize and normalize client's emotions. Therapist used CBT ask the client to identify her progress with frequency of use with coping skills with continued practice in her daily activity.    Therapist assigned client homework to practice self-care.     Plan: Return again in 5 weeks.  Diagnosis:    MDD, recurrent episode,moderate  Collaboration of Care: Patient refused AEB none requested by the client at this time.  Patient/Guardian was advised Release of Information must be obtained prior to any record release in order to collaborate their care with an outside provider. Patient/Guardian was advised if they have not already done so to contact the registration department to sign all necessary forms in order for [place] to release information regarding their care.   Consent: Patient/Guardian gives verbal consent for treatment and assignment of benefits for services provided during this visit. Patient/Guardian expressed understanding and agreed to proceed.   [place], LCSW 08/02/2022

## 2022-08-03 ENCOUNTER — Encounter (HOSPITAL_COMMUNITY): Payer: Self-pay

## 2022-08-03 NOTE — Plan of Care (Signed)
  Problem: Depression CCP Problem  1  Goal: LTG: Deidrea WILL SCORE LESS THAN 10 ON THE PATIENT HEALTH QUESTIONNAIRE (PHQ-9) Outcome: Progressing Goal: STG: Manessa WILL PARTICIPATE IN AT LEAST 80% OF SCHEDULED INDIVIDUAL PSYCHOTHERAPY SESSIONS Outcome: Progressing Goal: STG: Kahlia WILL COMPLETE AT LEAST 80% OF ASSIGNED HOMEWORK Outcome: Progressing

## 2022-08-12 ENCOUNTER — Ambulatory Visit: Payer: Commercial Managed Care - HMO | Attending: Internal Medicine | Admitting: Internal Medicine

## 2022-08-12 ENCOUNTER — Encounter: Payer: Self-pay | Admitting: Internal Medicine

## 2022-08-12 ENCOUNTER — Ambulatory Visit
Admission: RE | Admit: 2022-08-12 | Discharge: 2022-08-12 | Disposition: A | Discharge status: Home / Self Care | Payer: Commercial Managed Care - HMO | Source: Ambulatory Visit | Attending: Internal Medicine | Admitting: Internal Medicine

## 2022-08-12 ENCOUNTER — Other Ambulatory Visit: Payer: Self-pay

## 2022-08-12 VITALS — BP 114/79 | HR 108 | Temp 98.2°F | Ht 63.0 in | Wt 222.2 lb

## 2022-08-12 DIAGNOSIS — E669 Obesity, unspecified: Secondary | ICD-10-CM

## 2022-08-12 DIAGNOSIS — M722 Plantar fascial fibromatosis: Secondary | ICD-10-CM | POA: Diagnosis not present

## 2022-08-12 DIAGNOSIS — R000 Tachycardia, unspecified: Secondary | ICD-10-CM

## 2022-08-12 DIAGNOSIS — F17210 Nicotine dependence, cigarettes, uncomplicated: Secondary | ICD-10-CM

## 2022-08-12 DIAGNOSIS — E1169 Type 2 diabetes mellitus with other specified complication: Secondary | ICD-10-CM

## 2022-08-12 DIAGNOSIS — Z6839 Body mass index (BMI) 39.0-39.9, adult: Secondary | ICD-10-CM

## 2022-08-12 DIAGNOSIS — M25552 Pain in left hip: Secondary | ICD-10-CM

## 2022-08-12 DIAGNOSIS — L309 Dermatitis, unspecified: Secondary | ICD-10-CM

## 2022-08-12 DIAGNOSIS — E785 Hyperlipidemia, unspecified: Secondary | ICD-10-CM

## 2022-08-12 DIAGNOSIS — F1994 Other psychoactive substance use, unspecified with psychoactive substance-induced mood disorder: Secondary | ICD-10-CM

## 2022-08-12 LAB — GLUCOSE, POCT (MANUAL RESULT ENTRY): POC Glucose: 371 mg/dl — AB (ref 70–99)

## 2022-08-12 MED ORDER — TRIAMCINOLONE ACETONIDE 0.1 % EX CREA: 1.0000 | TOPICAL_CREAM | Freq: Two times a day (BID) | CUTANEOUS | 0 refills | Status: DC

## 2022-08-12 NOTE — Patient Instructions (Signed)
Plantar Fasciitis  Plantar fasciitis is a painful foot condition that affects the heel. It occurs when the band of tissue that connects the toes to the heel bone (plantar fascia) becomes irritated. This can happen as the result of exercising too much or doing other repetitive activities (overuse injury). Plantar fasciitis can cause mild irritation to severe pain that makes it difficult to walk or move. The pain is usually worse in the morning after sleeping, or after sitting or lying down for a period of time. Pain may also be worse after long periods of walking or standing. What are the causes? This condition may be caused by: Standing for long periods of time. Wearing shoes that do not have good arch support. Doing activities that put stress on joints (high-impact activities). This includes ballet and exercise that makes your heart beat faster (aerobic exercise), such as running. Being overweight. An abnormal way of walking (gait). Tight muscles in the back of your lower leg (calf). High arches in your feet or flat feet. Starting a new athletic activity. What are the signs or symptoms? The main symptom of this condition is heel pain. Pain may get worse after the following: Taking the first steps after a time of rest, especially in the morning after awakening, or after you have been sitting or lying down for a while. Long periods of standing still. Pain may decrease after 30-45 minutes of activity, such as gentle walking. How is this diagnosed? This condition may be diagnosed based on your medical history, a physical exam, and your symptoms. Your health care provider will check for: A tender area on the bottom of your foot. A high arch in your foot or flat feet. Pain when you move your foot. Difficulty moving your foot. You may have imaging tests to confirm the diagnosis, such as: X-rays. Ultrasound. MRI. How is this treated? Treatment for plantar fasciitis depends on how severe your  condition is. Treatment may include: Rest, ice, pressure (compression), and raising (elevating) the affected foot. This is called RICE therapy. Your health care provider may recommend RICE therapy along with over-the-counter pain medicines to manage your pain. Exercises to stretch your calves and your plantar fascia. A splint that holds your foot in a stretched, upward position while you sleep (night splint). Physical therapy to relieve symptoms and prevent problems in the future. Injections of steroid medicine (cortisone) to relieve pain and inflammation. Stimulating your plantar fascia with electrical impulses (extracorporeal shock wave therapy). This is usually the last treatment option before surgery. Surgery, if other treatments have not worked after 12 months. Follow these instructions at home: Managing pain, stiffness, and swelling  If directed, put ice on the painful area. To do this: Put ice in a plastic bag, or use a frozen bottle of water. Place a towel between your skin and the bag or bottle. Roll the bottom of your foot over the bag or bottle. Do this for 20 minutes, 2-3 times a day. Wear athletic shoes that have air-sole or gel-sole cushions, or try soft shoe inserts that are designed for plantar fasciitis. Elevate your foot above the level of your heart while you are sitting or lying down. Activity Avoid activities that cause pain. Ask your health care provider what activities are safe for you. Do physical therapy exercises and stretches as told by your health care provider. Try activities and forms of exercise that are easier on your joints (low impact). Examples include swimming, water aerobics, and biking. General instructions Take over-the-counter   and prescription medicines only as told by your health care provider. Wear a night splint while sleeping, if told by your health care provider. Loosen the splint if your toes tingle, become numb, or turn cold and blue. Maintain a  healthy weight, or work with your health care provider to lose weight as needed. Keep all follow-up visits. This is important. Contact a health care provider if you have: Symptoms that do not go away with home treatment. Pain that gets worse. Pain that affects your ability to move or do daily activities. Summary Plantar fasciitis is a painful foot condition that affects the heel. It occurs when the band of tissue that connects the toes to the heel bone (plantar fascia) becomes irritated. Heel pain is the main symptom of this condition. It may get worse after exercising too much or standing still for a long time. Treatment varies, but it usually starts with rest, ice, pressure (compression), and raising (elevating) the affected foot. This is called RICE therapy. Over-the-counter medicines can also be used to manage pain. This information is not intended to replace advice given to you by your health care provider. Make sure you discuss any questions you have with your health care provider. Document Revised: 03/27/2020 Document Reviewed: 03/27/2020 Elsevier Patient Education  2023 [employer]  

## 2022-08-12 NOTE — Progress Notes (Signed)
Patient ID: Whitney Benton, female    DOB: [birth year]  MRN: [id number]  CC: Diabetes   Subjective: Whitney Benton is a 51 y.o. female who presents for chronic ds management Her concerns today include:  Pt with hx of depression, migraines (Dr. Tomi Likens), mixed incontinence, DM, obesity, HL, vit D def, former smoker, on metoprolol for tachycardia  HM:  declines flu and shingrix vaccines.  DM/Obesity: Most recent A1c was 10.2 done earlier this month. Should be on Trulicity 4.5 mg once a week, glargine 67 units at Q a.m, NovoLog 20 05/11/2024 and metformin 500 mg twice a day.  Reports compliance with meds but did not take as yet for the morning because she was rushing to get to this appointment. -could not afford her co-pay on [place] device.  She has not been checking BS; just found glucometer and plans to start checking. -met with nutritionist and found it helpful  HL: Reports compliance with atorvastatin 40 mg.  Last LDL was 105 in December of last year.  Diagnosed with moderate to severe carpal tunnel syndrome bilaterally.  She saw orthopedics Dr. Erlinda Hong.  Advised that her A1c has to be much lower before they would consider doing surgical release. In the mean time she has been using the cock-up wrist splints  Complains of itchy rash on the outer aspect of the right lower leg x3 months.  No initiating factors.  She has not been using anything for it.  Complains of pain in the left groin area intermittently x2 to 3 months.  Worse with walking.  Sometimes it feels as though the joint pops out.  Also complains of pain in the heels when she first steps out of bed in the morning that can last for several hours.  Mild tachycardia today.  She has not taken her metoprolol as yet for the morning.  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   DKA (diabetic ketoacidosis) ([place]) 01/23/2021   Glaucoma suspect 04/12/2020   DKA, type 2, not at goal [facility]) 10/02/2019   AKI (acute kidney injury) ([place]) 10/02/2019   Hyperkalemia 10/02/2019   Leukocytosis 10/02/2019   Abnormal LFTs 10/02/2019   Stressful life events affecting family and household 08/06/2019   New onset type 2 diabetes mellitus ([place]) 02/04/2019   Obesity (BMI 35.0-39.9 without comorbidity) 02/04/2019   Tobacco dependence 02/04/2019   Left arm weakness 12/06/2014   Paresthesias/numbness 12/06/2014   Tobacco abuse 11/14/2014   Depression    Mixed incontinence 06/27/2014   History of TVH in 2006 for fibroids and menorrhagia; benign pathology 09/08/2011     Current Outpatient Medications on File Prior to Visit  Medication Sig Dispense Refill   albuterol (VENTOLIN HFA) 108 (90 Base) MCG/ACT inhaler Inhale 2 puffs into the lungs every 6 (six) hours as needed for wheezing or shortness of breath (Cough). 18 g 0   atorvastatin (LIPITOR) 40 MG tablet Take 1 tablet (40 mg total) by mouth daily. 90 tablet 2   Blood Glucose Monitoring Suppl (TRUE METRIX METER) w/Device KIT Check blood sugars three times a day 1 kit 0   busPIRone (BUSPAR) 15 MG tablet Take 1 tablet (15 mg total) by mouth 3 (three) times daily. 90 tablet 3  conjugated estrogens (PREMARIN) vaginal cream Apply 0.5 gram intravaginally 2 times a week 42.5 g 1   Continuous Blood Gluc Receiver (FREESTYLE LIBRE 2 READER) DEVI Use to check blood sugar three times daily. 1 each 0   Continuous Blood Gluc Sensor (FREESTYLE LIBRE 2 SENSOR) MISC Use to check blood sugar three times daily. 2 each 3   cycloSPORINE (RESTASIS) 0.05 % ophthalmic emulsion Apply 1 drop into both eyes twice a day 180 each 3   diclofenac (VOLTAREN) 75 MG EC tablet Take 1 tablet (75 mg total) by mouth 2 (two) times daily. 30  tablet 2   Dulaglutide (TRULICITY) 4.5 YQ/6.5HQ SOPN Inject 4.5 mg as directed once a week. 2 mL 3   DULoxetine (CYMBALTA) 20 MG capsule Take 1 capsule (20 mg total) by mouth daily. 30 capsule 3   DULoxetine (CYMBALTA) 60 MG capsule Take 1 capsule (60 mg total) by mouth daily. 30 capsule 3   Erenumab-aooe (AIMOVIG) 140 MG/ML SOAJ Inject 140 mg into the skin every 28 (twenty-eight) days. 1.12 mL 5   fexofenadine (ALLEGRA) 180 MG tablet Take 1 tablet (180 mg total) by mouth daily. 90 tablet 1   fluorometholone (FML) 0.1 % ophthalmic suspension Instill 1 drop into both eyes as directed 1 drop 4 times day for 2 weeks then 1 drop 2 times day for 2 weeks 10 mL 0   fluticasone (FLONASE) 50 MCG/ACT nasal spray Place 1 spray into both nostrils daily. Begin by using 2 sprays in each nare daily for 3 to 5 days, then decrease to 1 spray in each nare daily. 32 mL 1   gabapentin (NEURONTIN) 300 MG capsule Take 1 capsule (300 mg total) by mouth 3 (three) times daily. 90 capsule 3   Galcanezumab-gnlm (EMGALITY) 120 MG/ML SOAJ Inject 120 mg into the skin every 28 (twenty-eight) days. 1.12 mL 5   glucose blood (TRUE METRIX BLOOD GLUCOSE TEST) test strip Use to check blood sugar 3 times daily 100 each 2   hydrOXYzine (ATARAX) 25 MG tablet TAKE 1 TABLET (25 MG TOTAL) BY MOUTH 3 (THREE) TIMES DAILY AS NEEDED. 90 tablet 3   insulin aspart (NOVOLOG FLEXPEN) 100 UNIT/ML FlexPen INJECT 25 UNITS BEFORE BREAKFAST, 20 UNITS BEFORE LUNCH, AND 25 UNITS BEFORE DINNER. 15 mL 2   Insulin Glargine (BASAGLAR KWIKPEN) 100 UNIT/ML Inject 67 Units into the skin at bedtime. 18 mL 2   Insulin Pen Needle (PEN NEEDLES) 31G X 6 MM MISC Use as directed 100 each 0   metFORMIN (GLUCOPHAGE-XR) 500 MG 24 hr tablet Take 1 tablet (500 mg total) by mouth 2 (two) times daily. 180 tablet 1   metoprolol tartrate (LOPRESSOR) 25 MG tablet Take 0.5 tablets (12.5 mg total) by mouth 2 (two) times daily. 90 tablet 2   Multiple Vitamin (MULTIVITAMIN ADULT)  TABS Take 1 tablet by mouth daily.     Multiple Vitamins-Minerals (EYE VITAMINS PO) Take by mouth.     ondansetron (ZOFRAN ODT) 4 MG disintegrating tablet Take 1 tablet (4 mg total) by mouth every 8 (eight) hours as needed for nausea or vomiting. 20 tablet 0   QUEtiapine (SEROQUEL) 100 MG tablet Take 1 tablet (100 mg total) by mouth at bedtime. 30 tablet 3   TRUEPLUS LANCETS 28G MISC Check blood sugars three time a day 100 each 12   zolpidem (AMBIEN) 5 MG tablet Take 1 tablet (5 mg total) by mouth at bedtime as needed for sleep. 30 tablet 1   gabapentin (NEURONTIN) 100 MG capsule Take  1-3 capsules (100-300 mg total) by mouth at bedtime. (Patient not taking: Reported on 08/12/2022) 30 capsule 3   naproxen (NAPROSYN) 500 MG tablet Take 1 tablet (500 mg total) by mouth 2 (two) times daily as needed. (Patient not taking: Reported on 08/12/2022) 20 tablet 5   SUMAtriptan (IMITREX) 100 MG tablet TAKE 1 TABLET EARLIEST ONSET OF MIGRAINE. MAY REPEAT IN 2 HOURS IF HEADACHE PERSISTS OR RECURS. MAXIMUM 2 TABLETS IN 24 HOURS. (Patient not taking: Reported on 08/12/2022) 10 tablet 5   [DISCONTINUED] cetirizine (ZYRTEC) 10 MG tablet Take 1 tablet (10 mg total) by mouth daily. (Patient not taking: No sig reported) 30 tablet 1   No current facility-administered medications on file prior to visit.    Allergies  Allergen Reactions   Aspirin Hives   Oxycodone Nausea And Vomiting    Social History   Socioeconomic History   Marital status: Significant Other    Spouse name: Not on file   Number of children: 3   Years of education: 14   Highest education level: Not on file  Occupational History   Not on file  Tobacco Use   Smoking status: Former    Packs/day: 0.25    Years: 8.00    Total pack years: 2.00    Types: Cigarettes   Smokeless tobacco: Never  Vaping Use   Vaping Use: Never used  Substance and Sexual Activity   Alcohol use: Not Currently    Comment: occasional   Drug use: No   Sexual  activity: Yes    Birth control/protection: Surgical  Other Topics Concern   Not on file  Social History Narrative   Patient lives at home with mother and father , one story    Patient has 3 children    Patient is single   Patient has 14 years of education    Patient is right handed    Social Determinants of Health   Financial Resource Strain: Not on file  Food Insecurity: Food Insecurity Present (05/24/2022)   Hunger Vital Sign    Worried About Running Out of Food in the Last Year: Sometimes true    Ran Out of Food in the Last Year: Sometimes true  Transportation Needs: No Transportation Needs (01/30/2021)   PRAPARE - Hydrologist (Medical): No    Lack of Transportation (Non-Medical): No  Physical Activity: Not on file  Stress: Not on file  Social Connections: Not on file  Intimate Partner Violence: Not on file    Family History  Problem Relation Age of Onset   Diabetes Mother    Mental illness Mother    Depression Mother    Hypertension Mother    Colon cancer Father 53   Hypertension Father    Diabetes Father    Colon cancer Paternal Grandmother    Esophageal cancer Neg Hx    Stomach cancer Neg Hx     Past Surgical History:  Procedure Laterality Date   CESAREAN SECTION     UPPER GASTROINTESTINAL ENDOSCOPY     VAGINAL HYSTERECTOMY  12/23/2004   Fibroids, menorrhagia, benign pathology    ROS: Review of Systems Negative except as stated above  PHYSICAL EXAM: BP 114/79   Pulse (!) 108   Temp 98.2 F (36.8 C) (Oral)   Ht 5' 3"  (1.6 m)   Wt 222 lb 3.2 oz (100.8 kg)   SpO2 98%   BMI 39.36 kg/m   Wt Readings from Last 3 Encounters:  08/12/22 222  lb 3.2 oz (100.8 kg)  07/09/22 224 lb (101.6 kg)  06/11/22 224 lb (101.6 kg)    Physical Exam  General appearance - alert, well appearing, and in no distress Mental status - normal mood, behavior, speech, dress, motor activity, and thought processes Neck - supple, no significant  adenopathy Chest - clear to auscultation, no wheezes, rales or rhonchi, symmetric air entry Heart -tachycardic but regular. Musculoskeletal -left hip: Mild discomfort with internal and external rotation.  Mild discomfort with flexion.  No left inguinal hernia appreciated. Feet: No edema or erythema noted.  Mild discomfort on palpation of the plantar heels. Extremities - peripheral pulses normal, no pedal edema, no clubbing or cyanosis Skin: 3 to 4 cm excoriated area noted on the lower right leg above the lateral malleolus     Latest Ref Rng & Units 01/24/2022   11:34 AM 11/30/2021   10:39 AM 04/30/2021   10:16 AM  CMP  Glucose 70 - 99 mg/dL 363   186   BUN 6 - 24 mg/dL 14   10   Creatinine 0.57 - 1.00 mg/dL 1.04   0.85   Sodium 134 - 144 mmol/L 140   142   Potassium 3.5 - 5.2 mmol/L 4.6   3.9   Chloride 96 - 106 mmol/L 100   100   CO2 20 - 29 mmol/L 25   25   Calcium 8.7 - 10.2 mg/dL 9.3   9.3   Total Protein 6.0 - 8.5 g/dL  7.0    Total Bilirubin 0.0 - 1.2 mg/dL  0.3    Alkaline Phos 44 - 121 IU/L  141    AST 0 - 40 IU/L  20    ALT 0 - 32 IU/L  16     Lipid Panel     Component Value Date/Time   CHOL 177 11/30/2021 1039   TRIG 181 (H) 11/30/2021 1039   HDL 40 11/30/2021 1039   CHOLHDL 4.4 11/30/2021 1039   CHOLHDL 3.2 11/14/2014 0707   VLDL 19 11/14/2014 0707   LDLCALC 105 (H) 11/30/2021 1039    CBC    Component Value Date/Time   WBC 6.1 11/30/2021 1039   WBC 9.9 01/25/2021 0334   RBC 4.83 11/30/2021 1039   RBC 4.26 01/25/2021 0334   HGB 13.5 11/30/2021 1039   HCT 40.9 11/30/2021 1039   PLT 372 11/30/2021 1039   MCV 85 11/30/2021 1039   MCH 28.0 11/30/2021 1039   MCH 29.3 01/25/2021 0334   MCHC 33.0 11/30/2021 1039   MCHC 34.5 01/25/2021 0334   RDW 13.8 11/30/2021 1039   LYMPHSABS 1.5 01/23/2021 1052   LYMPHSABS 2.4 07/14/2019 1058   MONOABS 0.7 01/23/2021 1052   EOSABS 0.0 01/23/2021 1052   EOSABS 0.1 07/14/2019 1058   BASOSABS 0.0 01/23/2021 1052   BASOSABS  0.0 07/14/2019 1058   Results for orders placed or performed in visit on 08/12/22  POCT glucose (manual entry)  Result Value Ref Range   POC Glucose 371 (A) 70 - 99 mg/dl    ASSESSMENT AND PLAN:  1. Type 2 diabetes mellitus with obesity (HCC) Blood sugar elevated. Commended her for seeing the nutritionist.  Encouraged her to put into action which she has been taught. Continue current medications including glargine insulin 67 units every morning, NovoLog with meals, Trulicity and metformin.  Since she is unable to afford the [place] device, I have encouraged her to check her blood sugars at least twice a day before meals and bring  in the readings to see the clinical pharmacist in a few weeks.  No data today to allow me to adjust her medications. - POCT glucose (manual entry) - Microalbumin / creatinine urine ratio  2. Hyperlipidemia associated with type 2 diabetes mellitus (HCC) Continue atorvastatin 40 mg daily.  3. Dermatitis Questionable etiology.  We will try her with triamcinolone cream.  If no improvement will refer to dermatology.  4. Plantar fasciitis Discussed diagnosis with patient. Stressed the importance of wearing shoes with good insoles. Alternate heat and cold rubs on the heel.  Also advised use of a tennis ball to rub the heel back-and-forth to help sooth. - Ambulatory referral to Podiatry  5. 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  Discussed the importance of trying to get her weight down.  Use Tylenol over-the-counter as needed for now. - DG Hip Unilat W OR W/O Pelvis 2-3 Views Left; Future  6.  Tachycardia Asymptomatic. Advised to take the metoprolol consistently twice a day as prescribed. Patient was given the opportunity to ask questions.  Patient verbalized understanding of the plan and was able to repeat key elements of the plan.   This documentation was completed using Radio producer.  Any transcriptional errors are  unintentional.  Orders Placed This Encounter  Procedures   POCT glucose (manual entry)     Requested Prescriptions    No prescriptions requested or ordered in this encounter    No follow-ups on file.  Karle Plumber, MD, FACP

## 2022-08-15 ENCOUNTER — Other Ambulatory Visit: Payer: Self-pay

## 2022-08-15 MED ORDER — FREESTYLE LIBRE 2 SENSOR MISC
0 refills | Status: DC
  Filled 2022-08-15 – 2022-11-22 (×3): qty 1, 14d supply, fill #0
  Filled 2022-11-22: qty 1, 30d supply, fill #0

## 2022-08-16 ENCOUNTER — Other Ambulatory Visit: Payer: Self-pay

## 2022-08-20 ENCOUNTER — Other Ambulatory Visit (HOSPITAL_COMMUNITY): Payer: Self-pay

## 2022-08-21 ENCOUNTER — Ambulatory Visit (INDEPENDENT_AMBULATORY_CARE_PROVIDER_SITE_OTHER): Payer: Commercial Managed Care - HMO | Admitting: Clinical

## 2022-08-21 DIAGNOSIS — F331 Major depressive disorder, recurrent, moderate: Secondary | ICD-10-CM | POA: Diagnosis not present

## 2022-08-21 NOTE — Progress Notes (Signed)
   THERAPIST PROGRESS NOTE  Session Time: 35 minutes  Participation Level: Active  Behavioral Response: CasualAlertEuthymic  Type of Therapy: Individual Therapy  Treatment Goals addressed: client will complete 80% of assigned homework  ProgressTowards Goals: Progressing  Interventions: CBT and Supportive  Summary:  Whitney Benton is a 51 y.o. female who presents for the scheduled appointment oriented times five, appropriately dressed, and friendly. Client denied hallucinations and delusions. Client reported on today she has been doing fairly well but had some stressful events to transpire.  Client reported 2 weeks ago her sister's boyfriend invited for friends over to the house.  Client reported upon coming home she found him laying down and tried to wake him up but he did not. Client reported one of the people who he invited over was also sitting outside of the house and was found unresponsive. Client reported she called the paramedics and both of them had to be resuscitated.  Client reported the situation was a bit triggering for her because that is how she found her mother. Client also reported a close cousin was in a car accident and he is in ICU in a induced coma. Client reported overall she is doing okay following that situation but had to console her sister. Client reported following the situation her assisted boyfriend was leaving the house to the door open in the early hours of the morning. Client reported her sister is also responsible to help pay the home insurance. Client reported she tried to get on the boyfriend about being responsible with securing the house as well as her sister for upholding her end of financial maintenance. Client reported she has been going to church frequently to help keep herself in a good mental space.  Evidence of progress towards goal:  client reported she uses 1 good coping skill of attending church to help alleviate negative emotions and  thoughts.    Suicidal/Homicidal: Nowithout intent/plan  Therapist Response:  Therapist began the appointment asking the client how she has been doing since last seen. Therapist used CBT to engage using active listening and positive emotional support. Therapist used CBT to engage and ask the client to describe the stressors that have been impacting her emotions and thoughts and negatively. Therapist used CBT to engage and ask the client to discuss steps she has brainstormed or will be taking to help problem solve her stressors. Therapist used CBT to ask the client open ended questions and have the client to discuss if the stressor trigger thoughts related to grief.  Therapist used CBT ask the client to identify her progress with frequency of use with coping skills with continued practice in her daily activity.    Therapist assigned the client homework to practice self care.   Plan: Return again in 3 weeks.  Diagnosis: MDD, recurrent, episode moderate  Collaboration of Care: Patient refused AEB none requested by the client at this time.  Patient/Guardian was advised Release of Information must be obtained prior to any record release in order to collaborate their care with an outside provider. Patient/Guardian was advised if they have not already done so to contact the registration department to sign all necessary forms in order for [place] to release information regarding their care.   Consent: Patient/Guardian gives verbal consent for treatment and assignment of benefits for services provided during this visit. Patient/Guardian expressed understanding and agreed to proceed.   [place], LCSW 08/21/2022

## 2022-08-22 ENCOUNTER — Ambulatory Visit (INDEPENDENT_AMBULATORY_CARE_PROVIDER_SITE_OTHER): Payer: Commercial Managed Care - HMO

## 2022-08-22 ENCOUNTER — Encounter: Payer: Self-pay | Admitting: Podiatry

## 2022-08-22 ENCOUNTER — Ambulatory Visit: Payer: Commercial Managed Care - HMO | Admitting: Podiatry

## 2022-08-22 DIAGNOSIS — M7751 Other enthesopathy of right foot: Secondary | ICD-10-CM | POA: Diagnosis not present

## 2022-08-22 DIAGNOSIS — M722 Plantar fascial fibromatosis: Secondary | ICD-10-CM

## 2022-08-22 DIAGNOSIS — M7750 Other enthesopathy of unspecified foot: Secondary | ICD-10-CM

## 2022-08-22 DIAGNOSIS — M7752 Other enthesopathy of left foot: Secondary | ICD-10-CM | POA: Diagnosis not present

## 2022-08-22 MED ORDER — MELOXICAM 15 MG PO TABS: 15.0000 mg | ORAL_TABLET | Freq: Every day | ORAL | 0 refills | Status: DC

## 2022-08-22 NOTE — Patient Instructions (Signed)

## 2022-08-23 ENCOUNTER — Other Ambulatory Visit: Payer: Self-pay

## 2022-08-25 NOTE — Progress Notes (Signed)
  Subjective:  Patient ID: Whitney Benton, female    DOB: May 20, 1971,  MRN: [id number]  Chief Complaint  Patient presents with   Foot Problem    bil plantar fasciitis, pain level varies in intensity, pain at 7 today    51 y.o. female presents with the above complaint. Patient presents complaining of long term heel pain in both feet. Pain worse in morning with first steps out of bed. Pain level varies depending on activity level. Wears flip flops. Has not had orthotics or arch supports in past.    Review of Systems: Negative except as noted in the HPI. Denies N/V/F/Ch.   Objective:  There were no vitals filed for this visit. There is no height or weight on file to calculate BMI. Constitutional Well developed. Well nourished.  Vascular Dorsalis pedis pulses palpable bilaterally. Posterior tibial pulses palpable bilaterally. Capillary refill normal to all digits.  No cyanosis or clubbing noted. Pedal hair growth normal.  Neurologic Normal speech. Oriented to person, place, and time. Epicritic sensation to light touch grossly present bilaterally.  Dermatologic Nails well groomed and normal in appearance. No open wounds. No skin lesions.  Orthopedic: Normal joint ROM without pain or crepitus bilaterally. No visible deformities. Tender to palpation at the calcaneal tuber bilaterally. No pain with calcaneal squeeze bilaterally. Ankle ROM diminished range of motion bilaterally. Sil[place]old Test: deferred bilaterally.   Radiographs: Taken and reviewed. No acute fractures or dislocations. No evidence of stress fracture.  Plantar heel spur absent. Posterior heel spur absent.   Assessment:   1. Plantar fasciitis, bilateral   2. Tendonitis of ankle or foot    Plan:  Patient was evaluated and treated and all questions answered.  Plantar Fasciitis, bilaterally - XR reviewed as above.  - Educated on icing and stretching. Instructions given.  - DME: Powerstep inserts  dispensed.Recommend supportive shoes including running shoes at all times - Pharmacologic management: Meloxicam. Educated on risks/benefits and proper taking of medication.   Return in about 4 weeks (around 09/19/2022), or bilateral PF.

## 2022-08-26 NOTE — Plan of Care (Signed)
  Problem: Depression CCP Problem  1  Goal: LTG: Genita WILL SCORE LESS THAN 10 ON THE PATIENT HEALTH QUESTIONNAIRE (PHQ-9) Outcome: Progressing Goal: STG: Vivien WILL PARTICIPATE IN AT LEAST 80% OF SCHEDULED INDIVIDUAL PSYCHOTHERAPY SESSIONS Outcome: Progressing Goal: STG: Calli WILL COMPLETE AT LEAST 80% OF ASSIGNED HOMEWORK Outcome: Progressing

## 2022-09-01 ENCOUNTER — Ambulatory Visit (HOSPITAL_COMMUNITY)
Admission: EM | Admit: 2022-09-01 | Discharge: 2022-09-01 | Disposition: A | Discharge status: Home / Self Care | Payer: Commercial Managed Care - HMO | Attending: Emergency Medicine | Admitting: Emergency Medicine

## 2022-09-01 ENCOUNTER — Encounter (HOSPITAL_COMMUNITY): Payer: Self-pay | Admitting: *Deleted

## 2022-09-01 DIAGNOSIS — M5432 Sciatica, left side: Secondary | ICD-10-CM | POA: Diagnosis not present

## 2022-09-01 MED ORDER — IBUPROFEN 800 MG PO TABS
ORAL_TABLET | ORAL | Status: AC
  Filled 2022-09-01: qty 1

## 2022-09-01 MED ORDER — ACETAMINOPHEN 325 MG PO TABS
650.0000 mg | ORAL_TABLET | Freq: Once | ORAL | Status: AC
  Administered 2022-09-01: 650 mg via ORAL

## 2022-09-01 MED ORDER — IBUPROFEN 800 MG PO TABS
800.0000 mg | ORAL_TABLET | Freq: Once | ORAL | Status: AC
  Administered 2022-09-01: 800 mg via ORAL

## 2022-09-01 MED ORDER — ACETAMINOPHEN 325 MG PO TABS
ORAL_TABLET | ORAL | Status: AC
  Filled 2022-09-01: qty 2

## 2022-09-01 NOTE — Discharge Instructions (Addendum)
I recommend taking ibuprofen 800 mg every 6 hours as needed for pain control. You can alternate this with up to 1000 mg tylenol every 6 hours.  Please follow up with your primary care provider, or the orthopedic specialists if symptoms persist.

## 2022-09-01 NOTE — ED Triage Notes (Signed)
Pt states that pain started yesterday in her left thigh and today radiated from left buttocks to left foot. She hasnt taken any meds for the pain. She said that her leg feels hot to the touch.

## 2022-09-01 NOTE — ED Provider Notes (Signed)
[place]    CSN: [id number] Arrival date & time: 09/01/22  1051     History   Chief Complaint Chief Complaint  Patient presents with   Leg Pain    HPI Whitney Benton is a 51 y.o. female.  Presents with 1 day history of left leg pain. Reports she had pain in the posterior leg yesterday. Today she felt it radiate from left buttock all the way down to the left heel.  Reports that is worse with standing and bending.  10/10 pain She has history of sciatica on the right leg. Has not tried any medications.  She was seen by podiatry about a week ago for planter fasciitis.  Put on meloxicam daily but she has not taken any.  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   DKA (diabetic ketoacidosis) ([place]) 01/23/2021   Glaucoma suspect 04/12/2020   DKA, type 2, not at goal [facility]) 10/02/2019   AKI (acute kidney injury) ([place]) 10/02/2019   Hyperkalemia 10/02/2019   Leukocytosis 10/02/2019   Abnormal LFTs 10/02/2019   Stressful life events affecting family and household 08/06/2019   New onset type 2 diabetes mellitus ([place]) 02/04/2019   Obesity (BMI 35.0-39.9 without comorbidity) 02/04/2019   Tobacco dependence 02/04/2019   Left arm weakness 12/06/2014   Paresthesias/numbness 12/06/2014   Tobacco abuse 11/14/2014   Depression    Mixed incontinence 06/27/2014   History of TVH in 2006 for fibroids and menorrhagia; benign pathology 09/08/2011     Past Surgical History:  Procedure Laterality Date   CESAREAN SECTION     UPPER GASTROINTESTINAL ENDOSCOPY     VAGINAL HYSTERECTOMY  12/23/2004   Fibroids, menorrhagia, benign pathology    OB History     Gravida  5   Para  3   Term  3   Preterm      AB  2   Living  3      SAB  2   IAB  0   Ectopic  0   Multiple  0   Live Births               Home Medications    Prior to Admission medications   Medication Sig Start Date End Date Taking? Authorizing Provider  albuterol (VENTOLIN HFA) 108 (90 Base) MCG/ACT inhaler Inhale 2 puffs into the lungs every 6 (six) hours as needed for wheezing or shortness of breath (Cough). 04/29/22  Yes Lynden Oxford Scales, PA-C  atorvastatin (LIPITOR) 40 MG tablet Take 1 tablet (40 mg total) by mouth daily. 01/17/22  Yes Freada Bergeron, MD  Blood Glucose Monitoring Suppl (TRUE METRIX METER) w/Device KIT Check blood sugars three times a day 08/09/21  Yes Ladell Pier, MD  busPIRone (BUSPAR) 15 MG tablet Take 1 tablet (15 mg total) by mouth 3 (three) times daily. 06/24/22  Yes Salley Slaughter, NP  conjugated estrogens (PREMARIN) vaginal cream Apply  0.5 gram intravaginally 2 times a week 04/30/21  Yes Ladell Pier, MD  Continuous Blood Gluc Receiver (FREESTYLE LIBRE 2 READER) DEVI Use to check blood sugar three times daily. 07/23/22  Yes Ladell Pier, MD  Continuous Blood Gluc Sensor (FREESTYLE LIBRE 2 SENSOR) MISC Use to check blood sugar three times daily. 07/23/22  Yes Ladell Pier, MD  Continuous Blood Gluc Sensor (FREESTYLE LIBRE 2 SENSOR) MISC take as directed 08/15/22  Yes Charlott Rakes, MD  cycloSPORINE (RESTASIS) 0.05 % ophthalmic emulsion Apply 1 drop into both eyes twice a day 09/19/21  Yes   diclofenac (VOLTAREN) 75 MG EC tablet Take 1 tablet (75 mg total) by mouth 2 (two) times daily. 07/25/22  Yes Leandrew Koyanagi, MD  Dulaglutide (TRULICITY) 4.5 SH/7.52YO SOPN Inject 4.5 mg as directed once a week.  03/21/22  Yes Ladell Pier, MD  DULoxetine (CYMBALTA) 20 MG capsule Take 1 capsule (20 mg total) by mouth daily. 06/24/22  Yes Eulis Canner E, NP  DULoxetine (CYMBALTA) 60 MG capsule Take 1 capsule (60 mg total) by mouth daily. 06/24/22  Yes Salley Slaughter, NP  Erenumab-aooe (AIMOVIG) 140 MG/ML SOAJ Inject 140 mg into the skin every 28 (twenty-eight) days. 03/08/22  Yes Jaffe, Adam R, DO  fexofenadine (ALLEGRA) 180 MG tablet Take 1 tablet (180 mg total) by mouth daily. 04/29/22 10/26/22 Yes Lynden Oxford Scales, PA-C  fluorometholone (FML) 0.1 % ophthalmic suspension Instill 1 drop into both eyes as directed 1 drop 4 times day for 2 weeks then 1 drop 2 times day for 2 weeks 09/19/21  Yes   fluticasone (FLONASE) 50 MCG/ACT nasal spray Place 1 spray into both nostrils daily. Begin by using 2 sprays in each nare daily for 3 to 5 days, then decrease to 1 spray in each nare daily. 04/29/22  Yes Lynden Oxford Scales, PA-C  Galcanezumab-gnlm (EMGALITY) 120 MG/ML SOAJ Inject 120 mg into the skin every 28 (twenty-eight) days. 11/12/21  Yes Tomi Likens, Adam R, DO  glucose blood (TRUE METRIX BLOOD GLUCOSE TEST) test strip Use to check blood sugar 3 times daily 07/06/21  Yes Ladell Pier, MD  hydrOXYzine (ATARAX) 25 MG tablet TAKE 1 TABLET (25 MG TOTAL) BY MOUTH 3 (THREE) TIMES DAILY AS NEEDED. 06/24/22  Yes Eulis Canner E, NP  insulin aspart (NOVOLOG FLEXPEN) 100 UNIT/ML FlexPen INJECT 25 UNITS BEFORE BREAKFAST, 20 UNITS BEFORE LUNCH, AND 25 UNITS BEFORE DINNER. 07/18/22  Yes Ladell Pier, MD  Insulin Glargine (BASAGLAR KWIKPEN) 100 UNIT/ML Inject 67 Units into the skin at bedtime. 06/26/22  Yes Ladell Pier, MD  Insulin Pen Needle (PEN NEEDLES) 31G X 6 MM MISC Use as directed 02/26/19  Yes Ladell Pier, MD  metFORMIN (GLUCOPHAGE-XR) 500 MG 24 hr tablet Take 1 tablet (500 mg total) by mouth 2 (two) times daily. 07/18/22  Yes Ladell Pier, MD  metoprolol tartrate (LOPRESSOR) 25 MG  tablet Take 0.5 tablets (12.5 mg total) by mouth 2 (two) times daily. 05/09/22  Yes Freada Bergeron, MD  Multiple Vitamin (MULTIVITAMIN ADULT) TABS Take 1 tablet by mouth daily.   Yes [provider]  Multiple Vitamins-Minerals (EYE VITAMINS PO) Take by mouth.   Yes [provider]  ondansetron (ZOFRAN ODT) 4 MG disintegrating tablet Take 1 tablet (4 mg total) by mouth every 8 (eight) hours as needed for nausea or vomiting. 08/10/20  Yes Gareth Morgan, MD  QUEtiapine (SEROQUEL) 100 MG tablet Take 1 tablet (100 mg total) by mouth  at bedtime. 06/24/22  Yes Eulis Canner E, NP  SUMAtriptan (IMITREX) 100 MG tablet TAKE 1 TABLET EARLIEST ONSET OF MIGRAINE. MAY REPEAT IN 2 HOURS IF HEADACHE PERSISTS OR RECURS. MAXIMUM 2 TABLETS IN 24 HOURS. 02/09/21 05/24/24 Yes Jaffe, Adam R, DO  triamcinolone cream (KENALOG) 0.1 % Apply 1 Application topically 2 (two) times daily. 08/12/22  Yes Ladell Pier, MD  TRUEPLUS LANCETS 28G MISC Check blood sugars three time a day 02/04/19  Yes Ladell Pier, MD  zolpidem (AMBIEN) 5 MG tablet Take 1 tablet (5 mg total) by mouth at bedtime as needed for sleep. 04/01/22  Yes Penn, Lunette Stands, NP  cetirizine (ZYRTEC) 10 MG tablet Take 1 tablet (10 mg total) by mouth daily. Patient not taking: No sig reported 03/31/19 03/03/20  Charlott Rakes, MD    Family History Family History  Problem Relation Age of Onset   Diabetes Mother    Mental illness Mother    Depression Mother    Hypertension Mother    Colon cancer Father 62   Hypertension Father    Diabetes Father    Colon cancer Paternal Grandmother    Esophageal cancer Neg Hx    Stomach cancer Neg Hx     Social History Social History   Tobacco Use   Smoking status: Former    Packs/day: 0.25    Years: 8.00    Total pack years: 2.00    Types: Cigarettes   Smokeless tobacco: Never  Vaping Use   Vaping Use: Never used  Substance Use Topics   Alcohol use: Not Currently    Comment:  occasional   Drug use: No     Allergies   Aspirin and Oxycodone   Review of Systems Review of Systems Per HPI  Physical Exam Triage Vital Signs ED Triage Vitals  Enc Vitals Group     BP 09/01/22 1142 124/84     Pulse Rate 09/01/22 1142 85     Resp 09/01/22 1142 20     Temp 09/01/22 1142 98.1 F (36.7 C)     Temp Source 09/01/22 1142 Oral     SpO2 09/01/22 1142 99 %     Weight --      Height --      Head Circumference --      Peak Flow --      Pain Score 09/01/22 1138 10     Pain Loc --      Pain Edu? --      Excl. in [place]? --    No data found.  Updated Vital Signs BP 124/84 (BP Location: Right Arm)   Pulse 85   Temp 98.1 F (36.7 C) (Oral)   Resp 20   SpO2 99%   Physical Exam Vitals and nursing note reviewed.  Constitutional:      General: She is not in acute distress.    Appearance: Normal appearance.  HENT:     Mouth/Throat:     Pharynx: Oropharynx is clear.  Eyes:     Conjunctiva/sclera: Conjunctivae normal.  Cardiovascular:     Rate and Rhythm: Normal rate and regular rhythm.     Pulses: Normal pulses.     Heart sounds: Normal heart sounds.  Pulmonary:     Effort: Pulmonary effort is normal.     Breath sounds: Normal breath sounds.  Musculoskeletal:     Left lower leg: No swelling. No edema.     Comments: Full ROM at the ankles, knees.  Decreased ROM with flexion  left hip due to reproduction of pain. Positive left straight leg raise.  No swelling, warmth, erythema of the lower extremity  Neurological:     Mental Status: She is alert and oriented to person, place, and time.     UC Treatments / Results  Labs (all labs ordered are listed, but only abnormal results are displayed) Labs Reviewed - No data to display  EKG   Radiology No results found.  Procedures Procedures   Medications Ordered in UC Medications  ibuprofen (ADVIL) tablet 800 mg (800 mg Oral Given 09/01/22 1216)  acetaminophen (TYLENOL) tablet 650 mg (650 mg Oral Given  09/01/22 1249)    Initial Impression / Assessment and Plan / UC Course  I have reviewed the triage vital signs and the nursing notes.  Pertinent labs & imaging results that were available during my care of the patient were reviewed by me and considered in my medical decision making (see chart for details).  Likely sciatica.  Ibuprofen dose given. Patient reports no improvement in pain after 30 minutes. Tylenol dose given, improved down to 6/10. Recommend alternating these for pain control. With uncontrolled DM would like to avoid steroids for now if possible. Follow up with orthopedics as needed. [facility] (seen in past[facility] vs [facility] Return precautions discussed. Patient agrees to plan  Final Clinical Impressions(s) / UC Diagnoses   Final diagnoses:  Left sided sciatica     Discharge Instructions      I recommend taking ibuprofen 800 mg every 6 hours as needed for pain control. You can alternate this with up to 1000 mg tylenol every 6 hours.  Please follow up with your primary care provider, or the orthopedic specialists if symptoms persist.     ED Prescriptions   None    PDMP not reviewed this encounter.   Jori Frerichs, Vernice Jefferson 09/01/22 1335

## 2022-09-02 ENCOUNTER — Other Ambulatory Visit: Payer: Self-pay

## 2022-09-02 ENCOUNTER — Other Ambulatory Visit: Payer: Self-pay | Admitting: Internal Medicine

## 2022-09-03 ENCOUNTER — Other Ambulatory Visit: Payer: Self-pay

## 2022-09-03 MED ORDER — ONDANSETRON 4 MG PO TBDP
4.0000 mg | ORAL_TABLET | Freq: Three times a day (TID) | ORAL | 0 refills | Status: DC | PRN
  Filled 2022-09-03: qty 20, 7d supply, fill #0

## 2022-09-04 ENCOUNTER — Other Ambulatory Visit: Payer: Self-pay

## 2022-09-04 ENCOUNTER — Ambulatory Visit (INDEPENDENT_AMBULATORY_CARE_PROVIDER_SITE_OTHER): Payer: Commercial Managed Care - HMO

## 2022-09-04 ENCOUNTER — Ambulatory Visit (INDEPENDENT_AMBULATORY_CARE_PROVIDER_SITE_OTHER): Payer: Commercial Managed Care - HMO | Admitting: Orthopaedic Surgery

## 2022-09-04 DIAGNOSIS — M5442 Lumbago with sciatica, left side: Secondary | ICD-10-CM | POA: Diagnosis not present

## 2022-09-04 MED ORDER — METHOCARBAMOL 500 MG PO TABS: 500.0000 mg | ORAL_TABLET | Freq: Three times a day (TID) | ORAL | 2 refills | Status: DC | PRN

## 2022-09-04 MED ORDER — PREDNISONE 5 MG (21) PO TBPK: ORAL_TABLET | ORAL | 0 refills | Status: DC

## 2022-09-04 NOTE — Progress Notes (Signed)
Office Visit Note   Patient: Whitney Benton           Date of Birth: September 12, 1971           MRN: [id number] Visit Date: 09/04/2022              Requested by: Ladell Pier, MD [employer] [place],  [place] [address] PCP: Ladell Pier, MD   Assessment & Plan: Visit Diagnoses:  1. Acute left-sided low back pain with left-sided sciatica     Plan: Impression is left lower extremity radiculopathy.  At this point, would like to start the patient on a steroid taper muscle relaxer.  I believe she is in too much pain to attend formal physical therapy at this time, but I have gone ahead and provided her with a home exercise program.  She will follow-up with [place] as needed.  Call with concerns or questions.  Follow-Up Instructions: Return if symptoms worsen or fail to improve.   Orders:  Orders Placed This Encounter  Procedures   XR Lumbar Spine 2-3 Views   No orders of the defined types were placed in this encounter.     Procedures: No procedures performed   Clinical Data: No additional findings.   Subjective: Chief Complaint  Patient presents with   Left Leg - Pain    HPI patient is a pleasant 51 year old female who comes in today with left lower extremity pain for the past 4 days.  She denies any injury or change in activity.  The pain starts in her buttock and radiates down to the foot.  She describes this is constant worse with any movement such as taking care of her 70-monthold grandchild.  She has been taking ibuprofen without relief.  She does note paresthesias to the left lower extremity.  She denies any bowel or bladder change or saddle paresthesias.  No history of lumbar pathology.  Review of Systems as detailed in HPI.  All others reviewed and are negative.   Objective: Vital Signs: There were no vitals taken for this visit.  Physical Exam well-developed well-nourished female no acute distress.  Alert and oriented x3.  Ortho Exam lumbar spine  exam shows no spinous tenderness.  She does have moderate left lower lumbar paraspinous musculature tenderness.  Markedly positive straight leg raise on the left.  No focal weakness.  She is neurovascular tact distally.  Specialty Comments:  No specialty comments available.  Imaging: No results found.   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   DKA (diabetic ketoacidosis) ([place] 01/23/2021   Glaucoma suspect 04/12/2020   DKA, type 2, not at goal ([facility] 10/02/2019   AKI (acute kidney injury) ([place] 10/02/2019   Hyperkalemia 10/02/2019   Leukocytosis 10/02/2019   Abnormal LFTs 10/02/2019   Stressful life events affecting family and household 08/06/2019   New onset type 2 diabetes mellitus ([place] 02/04/2019   Obesity (BMI 35.0-39.9 without comorbidity) 02/04/2019   Tobacco dependence 02/04/2019   Left arm weakness 12/06/2014   Paresthesias/numbness 12/06/2014   Tobacco abuse 11/14/2014   Depression    Mixed incontinence 06/27/2014   History of TVH in 2006 for fibroids and menorrhagia; benign pathology 09/08/2011   Past  Medical History:  Diagnosis Date   Anxiety    Asthma    Depression    Diabetes mellitus without complication ([place])    Glaucoma suspect    Trichomonas infection     Family History  Problem Relation Age of Onset   Diabetes Mother    Mental illness Mother    Depression Mother    Hypertension Mother    Colon cancer Father 8   Hypertension Father    Diabetes Father    Colon cancer Paternal Grandmother    Esophageal cancer Neg Hx    Stomach cancer Neg Hx     Past Surgical History:  Procedure Laterality Date   CESAREAN SECTION     UPPER  GASTROINTESTINAL ENDOSCOPY     VAGINAL HYSTERECTOMY  12/23/2004   Fibroids, menorrhagia, benign pathology   Social History   Occupational History   Not on file  Tobacco Use   Smoking status: Former    Packs/day: 0.25    Years: 8.00    Total pack years: 2.00    Types: Cigarettes   Smokeless tobacco: Never  Vaping Use   Vaping Use: Never used  Substance and Sexual Activity   Alcohol use: Not Currently    Comment: occasional   Drug use: No   Sexual activity: Yes    Birth control/protection: Surgical

## 2022-09-05 ENCOUNTER — Other Ambulatory Visit: Payer: Self-pay | Admitting: Internal Medicine

## 2022-09-05 ENCOUNTER — Other Ambulatory Visit: Payer: Self-pay

## 2022-09-05 DIAGNOSIS — E1165 Type 2 diabetes mellitus with hyperglycemia: Secondary | ICD-10-CM

## 2022-09-05 MED ORDER — BASAGLAR KWIKPEN 100 UNIT/ML ~~LOC~~ SOPN
67.0000 [IU] | PEN_INJECTOR | Freq: Every day | SUBCUTANEOUS | 2 refills | Status: DC
  Filled 2022-09-05: qty 21, 31d supply, fill #0

## 2022-09-09 ENCOUNTER — Other Ambulatory Visit: Payer: Self-pay

## 2022-09-09 NOTE — Progress Notes (Unsigned)
NEUROLOGY FOLLOW UP OFFICE NOTE  MAXIMA SKELTON [id number]  Assessment/Plan:   1  Migraine without Aura, Without Status Migrainosus, Not Intractable 2  Bilateral upper extremity pain and numbness - prior workup revealed carpal tunnel syndrome.   NCV-EMG upper extremities Migraine prevention:  Start Aimovig 129m every 28 days Migraine rescue:  Take naproxen 504mwith sumatriptan 10045mLimit use of pain relievers to no more than 2 days out of week to prevent risk of rebound or medication-overuse headache. Keep headache diary Follow up 6 months.     Subjective:  Kylen R. BroMattila a 51 29ar old right-handed female with diabetes who follows up for migraines and bilateral occipital neuralgia.     UPDATE: NCV-EMG on 07/23/2022 revealed evidence of bilateral moderate to severe carpal tunnel syndrome.  Referred to hand specialist.  Surgery was recommended but not until Hgb A1c is under 8.0.  ***  Intensity:  Moderate to severe Duration:  if sumatriptan effective, it lasts 1 to 2 hours, otherwise all day.  Effective 50% of the time.   Frequency:  3 days a week      Rescue therapy:  sumatriptan 100m63mrequency of abortive medication: daily Current NSAIDS:  none - GI bleed Current analgesics:  Tylenol Current triptans:  sumatriptan 100mg59mrent ergotamine:  none Current anti-emetic:  Zofran ODT 4mg C28ment muscle relaxants:  Flexeril 10mg Q83murrent anti-anxiolytic:  hydroxyzine Current sleep aide:  melatonin Current Antihypertensive medications:  metoprolol tartrate Current Antidepressant medications:  Cymbalta 60mg da78mCurrent Anticonvulsant medications:  none Current anti-CGRP:  Emgality Current Vitamins/Herbal/Supplements:  Melatonin, B12 Current Antihistamines/Decongestants:  none Other therapy:  none Hormone/birth control:  none   Caffeine:  Cut down on coffee.  Now only decaff.  No soda Diet:  Drinks water with electrolytes.  Does not skip meals Exercise:  Walks  30 minutes daily Depression:  yes; Anxiety:  yes Other pain:  no Sleep hygiene:  poor   HISTORY:  Migraines since 2000 but returned around 2019.  Initial workup in early 2000s included lumbar puncture but unsure of the results.  She doesn't remember if she had an MRI of the brain.  They are severe sharp and pounding pain in back of head bilaterally and radiates to the front.  She has associated flashes in her vision, photophobia, phonophobia, osmophobia, feels off-balance and sometimes nausea but no  Autonomic symptoms, numbness or weakness.  They usually last 2-3 weeks.  They occur about twice a year, usually in Spring and Summer but can occur other times.  Triggers unknown.  Nothing really relieves them.   CT brain on 06/14/2020 personally reviewed was normal. Eye exam in 2021 okay.  In November 2015, she began experiencing left arm weakness and pain.  She reports numbness in the fingertips and palm of both hands.  MRI of cervical spine 11/14/2014 showed multilevel disc degeneration and spondylosis with bilateral mild foraminal stenosis and mild spinal stenosis at C5-6.  She had a NCV-EMG which revealed bilateral carpal tunnel syndrome.  She was referred to a hand specialist at that time but never followed up.  Continues to have some numbness in left hand.  More recently involving the right hand.   Past NSAIDS:  Meloxicam, ketorolac, ibuprofen, naproxen Past analgesics:  Tylenol, Excedrin Past abortive triptans:  rizatriptan 10mg.  S55mriptan NS too expensive. Past abortive ergotamine:  none Past muscle relaxants:  Robaxin Past anti-emetic:  promethazine Past antihypertensive medications:  none Past antidepressant medications:  Nortriptyline, sertraline 50mg74m  Past anticonvulsant medications:  topiramate $RemoveBef'100mg'LsqCNIeclO$  Past anti-CGRP:  none Past vitamins/Herbal/Supplements:  none Past antihistamines/decongestants:  Benadryl, Zyrtec Other past therapies:  none     Family history of headache:   no  PAST MEDICAL HISTORY: Past Medical History:  Diagnosis Date   Anxiety    Asthma    Depression    Diabetes mellitus without complication ([place])    Glaucoma suspect    Trichomonas infection     MEDICATIONS: Current Outpatient Medications on File Prior to Visit  Medication Sig Dispense Refill   albuterol (VENTOLIN HFA) 108 (90 Base) MCG/ACT inhaler Inhale 2 puffs into the lungs every 6 (six) hours as needed for wheezing or shortness of breath (Cough). 18 g 0   atorvastatin (LIPITOR) 40 MG tablet Take 1 tablet (40 mg total) by mouth daily. 90 tablet 2   Blood Glucose Monitoring Suppl (TRUE METRIX METER) w/Device KIT Check blood sugars three times a day 1 kit 0   busPIRone (BUSPAR) 15 MG tablet Take 1 tablet (15 mg total) by mouth 3 (three) times daily. 90 tablet 3   conjugated estrogens (PREMARIN) vaginal cream Apply 0.5 gram intravaginally 2 times a week 42.5 g 1   Continuous Blood Gluc Receiver (FREESTYLE LIBRE 2 READER) DEVI Use to check blood sugar three times daily. 1 each 0   Continuous Blood Gluc Sensor (FREESTYLE LIBRE 2 SENSOR) MISC Use to check blood sugar three times daily. 2 each 3   Continuous Blood Gluc Sensor (FREESTYLE LIBRE 2 SENSOR) MISC take as directed 1 each 0   cycloSPORINE (RESTASIS) 0.05 % ophthalmic emulsion Apply 1 drop into both eyes twice a day 180 each 3   diclofenac (VOLTAREN) 75 MG EC tablet Take 1 tablet (75 mg total) by mouth 2 (two) times daily. 30 tablet 2   Dulaglutide (TRULICITY) 4.5 YH/8.8IL SOPN Inject 4.5 mg as directed once a week. 2 mL 3   DULoxetine (CYMBALTA) 20 MG capsule Take 1 capsule (20 mg total) by mouth daily. 30 capsule 3   DULoxetine (CYMBALTA) 60 MG capsule Take 1 capsule (60 mg total) by mouth daily. 30 capsule 3   Erenumab-aooe (AIMOVIG) 140 MG/ML SOAJ Inject 140 mg into the skin every 28 (twenty-eight) days. 1.12 mL 5   fexofenadine (ALLEGRA) 180 MG tablet Take 1 tablet (180 mg total) by mouth daily. 90 tablet 1   fluorometholone  (FML) 0.1 % ophthalmic suspension Instill 1 drop into both eyes as directed 1 drop 4 times day for 2 weeks then 1 drop 2 times day for 2 weeks 10 mL 0   fluticasone (FLONASE) 50 MCG/ACT nasal spray Place 1 spray into both nostrils daily. Begin by using 2 sprays in each nare daily for 3 to 5 days, then decrease to 1 spray in each nare daily. 32 mL 1   Galcanezumab-gnlm (EMGALITY) 120 MG/ML SOAJ Inject 120 mg into the skin every 28 (twenty-eight) days. 1.12 mL 5   glucose blood (TRUE METRIX BLOOD GLUCOSE TEST) test strip Use to check blood sugar 3 times daily 100 each 2   hydrOXYzine (ATARAX) 25 MG tablet TAKE 1 TABLET (25 MG TOTAL) BY MOUTH 3 (THREE) TIMES DAILY AS NEEDED. 90 tablet 3   insulin aspart (NOVOLOG FLEXPEN) 100 UNIT/ML FlexPen INJECT 25 UNITS BEFORE BREAKFAST, 20 UNITS BEFORE LUNCH, AND 25 UNITS BEFORE DINNER. 15 mL 2   Insulin Glargine (BASAGLAR KWIKPEN) 100 UNIT/ML Inject 67 Units into the skin at bedtime. 21 mL 2   Insulin Pen Needle (PEN NEEDLES) 31G  X 6 MM MISC Use as directed 100 each 0   metFORMIN (GLUCOPHAGE-XR) 500 MG 24 hr tablet Take 1 tablet (500 mg total) by mouth 2 (two) times daily. 180 tablet 1   methocarbamol (ROBAXIN) 500 MG tablet Take 1 tablet (500 mg total) by mouth 3 (three) times daily as needed for muscle spasms. 20 tablet 2   metoprolol tartrate (LOPRESSOR) 25 MG tablet Take 0.5 tablets (12.5 mg total) by mouth 2 (two) times daily. 90 tablet 2   Multiple Vitamin (MULTIVITAMIN ADULT) TABS Take 1 tablet by mouth daily.     Multiple Vitamins-Minerals (EYE VITAMINS PO) Take by mouth.     ondansetron (ZOFRAN-ODT) 4 MG disintegrating tablet Take 1 tablet (4 mg total) by mouth every 8 (eight) hours as needed for nausea or vomiting. 20 tablet 0   predniSONE (STERAPRED UNI-PAK 21 TAB) 5 MG (21) TBPK tablet Take as directed 21 tablet 0   QUEtiapine (SEROQUEL) 100 MG tablet Take 1 tablet (100 mg total) by mouth at bedtime. 30 tablet 3   SUMAtriptan (IMITREX) 100 MG tablet  TAKE 1 TABLET EARLIEST ONSET OF MIGRAINE. MAY REPEAT IN 2 HOURS IF HEADACHE PERSISTS OR RECURS. MAXIMUM 2 TABLETS IN 24 HOURS. 10 tablet 5   triamcinolone cream (KENALOG) 0.1 % Apply 1 Application topically 2 (two) times daily. 30 g 0   TRUEPLUS LANCETS 28G MISC Check blood sugars three time a day 100 each 12   zolpidem (AMBIEN) 5 MG tablet Take 1 tablet (5 mg total) by mouth at bedtime as needed for sleep. 30 tablet 1   [DISCONTINUED] cetirizine (ZYRTEC) 10 MG tablet Take 1 tablet (10 mg total) by mouth daily. (Patient not taking: No sig reported) 30 tablet 1   No current facility-administered medications on file prior to visit.    ALLERGIES: Allergies  Allergen Reactions   Aspirin Hives   Oxycodone Nausea And Vomiting    FAMILY HISTORY: Family History  Problem Relation Age of Onset   Diabetes Mother    Mental illness Mother    Depression Mother    Hypertension Mother    Colon cancer Father 83   Hypertension Father    Diabetes Father    Colon cancer Paternal Grandmother    Esophageal cancer Neg Hx    Stomach cancer Neg Hx       Objective:  *** General: No acute distress.  Patient appears ***-groomed.   Head:  Normocephalic/atraumatic Eyes:  Fundi examined but not visualized Neck: supple, no paraspinal tenderness, full range of motion Heart:  Regular rate and rhythm Lungs:  Clear to auscultation bilaterally Back: No paraspinal tenderness Neurological Exam: alert and oriented to person, place, and time.  Speech fluent and not dysarthric, language intact.  CN II-XII intact. Bulk and tone normal, muscle strength 5/5 throughout.  Sensation to light touch intact.  Deep tendon reflexes 2+ throughout, toes downgoing.  Finger to nose testing intact.  Gait normal, Romberg negative.   Metta Clines, DO  CC: ***

## 2022-09-10 ENCOUNTER — Ambulatory Visit (INDEPENDENT_AMBULATORY_CARE_PROVIDER_SITE_OTHER): Payer: Commercial Managed Care - HMO | Admitting: Neurology

## 2022-09-10 ENCOUNTER — Encounter: Payer: Self-pay | Admitting: Neurology

## 2022-09-10 VITALS — BP 134/87 | HR 108 | Ht 63.0 in | Wt 231.0 lb

## 2022-09-10 DIAGNOSIS — G5603 Carpal tunnel syndrome, bilateral upper limbs: Secondary | ICD-10-CM

## 2022-09-10 DIAGNOSIS — G43009 Migraine without aura, not intractable, without status migrainosus: Secondary | ICD-10-CM

## 2022-09-10 MED ORDER — AIMOVIG 140 MG/ML ~~LOC~~ SOAJ
140.0000 mg | SUBCUTANEOUS | 5 refills | Status: DC
Start: 2022-09-10 — End: 2023-03-11

## 2022-09-10 MED ORDER — SUMATRIPTAN SUCCINATE 100 MG PO TABS: ORAL_TABLET | ORAL | 5 refills | Status: DC

## 2022-09-10 NOTE — Patient Instructions (Signed)
Continue aimovig every 28 days Take sumatriptan for migraine attacks.  Limit use of pain relievers to no more than 2 days out of week to prevent risk of rebound or medication-overuse headache. Keep headache diary Follow up 6 months.

## 2022-09-11 ENCOUNTER — Other Ambulatory Visit: Payer: Self-pay | Admitting: Podiatry

## 2022-09-11 ENCOUNTER — Ambulatory Visit (INDEPENDENT_AMBULATORY_CARE_PROVIDER_SITE_OTHER): Payer: Commercial Managed Care - HMO | Admitting: Clinical

## 2022-09-11 DIAGNOSIS — F331 Major depressive disorder, recurrent, moderate: Secondary | ICD-10-CM

## 2022-09-11 NOTE — Progress Notes (Signed)
   THERAPIST PROGRESS NOTE  Session Time: 30 minutes  Participation Level: Active  Behavioral Response: CasualAlertDepressed  Type of Therapy: Individual Therapy  Treatment Goals addressed: client will participate in 80% of scheduled therapy session  ProgressTowards Goals: Progressing  Interventions: CBT and Supportive  Summary:  Whitney Benton is a 51 y.o. female who presents for the scheduled appointment oriented x5, appropriately dressed, and friendly.  Client denied hallucinations and delusions. Client reported on today she has been doing fairly well but has been experiencing several stressors related to family matters.  Client reported last week while she was at home her sister's boyfriend told her that her sister had to be transported to the hospital by ambulance.  Client reported her sister is recovering well.  Client reported otherwise her sister has had the responsibility of paying the home insurance but she stopped paying and they were dropped from it.  Client reported she is figuring out how to get insurance back on and then she will take on responsibility for paying all the bills on time. Client reported she has also been feeling irritable and stressed dealing with her son and his girlfriend regarding the grandson. Client reported her son has cameras at his house and he has been monitoring her phone conversations and her whereabouts.  Client reported she feels that they are ungrateful.  Client reported she is bracing herself for this coming weekend on Friday will be the anniversary of her mother going into inpatient care.  Client reported she plans to go to the grave site and put flowers down. Evidence of progress towards goal: Client reported 1 positive scale used at least 6 days out of the week which is reframing negative thoughts and finding a positive.  Suicidal/Homicidal: Nowithout intent/plan  Therapist Response:  Therapist began the appointment asking the client how she has  been doing since last seen. Therapist used CBT to engage using active listening and positive emotional support. Therapist used CBT to engage and ask the client to describe stressors within her family that have negatively impacted her mood. Therapist used CBT to ask the client how she is processing grief and embracing her routine since her mother passed. Therapist used CBT to reinforce self care, normalize her emotions, and having special rituals to honor her mother. Therapist used CBT ask the client to identify her progress with frequency of use with coping skills with continued practice in her daily activity.      Plan: Return again in 4 weeks.  Diagnosis: MDD, recurrent episode, moderate  Collaboration of Care: Patient refused AEB none requested by the client.  Patient/Guardian was advised Release of Information must be obtained prior to any record release in order to collaborate their care with an outside provider. Patient/Guardian was advised if they have not already done so to contact the registration department to sign all necessary forms in order for [place] to release information regarding their care.   Consent: Patient/Guardian gives verbal consent for treatment and assignment of benefits for services provided during this visit. Patient/Guardian expressed understanding and agreed to proceed.   [place], LCSW 09/11/2022

## 2022-09-14 NOTE — Plan of Care (Signed)
  Problem: Depression CCP Problem  1  Goal: LTG: [name] WILL SCORE LESS THAN 10 ON THE PATIENT HEALTH QUESTIONNAIRE (PHQ-9) Outcome: Progressing Goal: STG: Whitney Benton WILL PARTICIPATE IN AT LEAST 80% OF SCHEDULED INDIVIDUAL PSYCHOTHERAPY SESSIONS Outcome: Progressing Goal: STG: Whitney Benton WILL COMPLETE AT LEAST 80% OF ASSIGNED HOMEWORK Outcome: Progressing

## 2022-09-16 ENCOUNTER — Ambulatory Visit: Payer: Commercial Managed Care - HMO | Attending: Internal Medicine | Admitting: Pharmacist

## 2022-09-16 DIAGNOSIS — Z794 Long term (current) use of insulin: Secondary | ICD-10-CM | POA: Diagnosis not present

## 2022-09-16 DIAGNOSIS — E1165 Type 2 diabetes mellitus with hyperglycemia: Secondary | ICD-10-CM

## 2022-09-16 MED ORDER — NOVOLOG FLEXPEN 100 UNIT/ML ~~LOC~~ SOPN
PEN_INJECTOR | SUBCUTANEOUS | 2 refills | Status: DC
  Filled 2022-10-15: qty 15, 25d supply, fill #0
  Filled 2022-11-13: qty 15, 25d supply, fill #1

## 2022-09-16 MED ORDER — BASAGLAR KWIKPEN 100 UNIT/ML ~~LOC~~ SOPN
62.0000 [IU] | PEN_INJECTOR | Freq: Every day | SUBCUTANEOUS | 2 refills | Status: DC
  Filled 2022-10-15: qty 21, 33d supply, fill #0
  Filled 2022-11-13: qty 18, 29d supply, fill #1

## 2022-09-16 NOTE — Progress Notes (Signed)
S:     No chief complaint on file.  Whitney Benton is a 51 y.o. female who presents for diabetes evaluation, education, and management. PMH is significant for depression, migraines (Dr. Tomi Likens), mixed incontinence, DM, obesity, HL, vit D def, former smoker. Patient was referred and last seen by Primary Care Provider on 04/04/22. Last seen by CPP on 07/23/2022.  At last visit, patient reported her highest sugars are seen after lunch. Novolog was increased from 15 to 20 units before lunch.  Today, patient arrives in good spirits and presents without any assistance. Reports she was only able to wear her CGM for a few days and is unable to afford refills at this time. Has not been checking with a glucometer. She reports skipping her meal time insulin if BG was <70.   Current diabetes medications include: basaglar 67 units daily, Novolog 25 units with breakfast and dinner and 20 units with lunch, metformin 030 mg BID, Trulicity 4.5 mg weekly   Patient reports adherence to taking all medications as prescribed.   Insurance coverage: [employer]  Patient reports hypoglycemic events.58-68 mg/dL Reported feeling shaky and dizzy. Per CGM, time below range was 3%.  Reported home fasting blood sugars: 100s-200s  Reported 2 hour post-meal/random blood sugars: 200-250s. Was able to wear a CGM for ~7 days. Average over that period was 167 mg/dL.   Patient denies nocturia (nighttime urination).  Patient denies neuropathy (nerve pain). Patient denies visual changes. Patient denies self foot exams.   Patient-reported exercise habits:   O:   Lab Results  Component Value Date   HGBA1C 10.2 (H) 07/25/2022   There were no vitals filed for this visit.  Lipid Panel     Component Value Date/Time   CHOL 177 11/30/2021 1039   TRIG 181 (H) 11/30/2021 1039   HDL 40 11/30/2021 1039   CHOLHDL 4.4 11/30/2021 1039   CHOLHDL 3.2 11/14/2014 0707   VLDL 19 11/14/2014 0707   LDLCALC 105 (H) 11/30/2021 1039     Clinical Atherosclerotic Cardiovascular Disease (ASCVD): No  The 10-year ASCVD risk score (Arnett DK, et al., 2019) is: 16.6%   Values used to calculate the score:     Age: 41 years     Sex: Female     Is Non-Hispanic African American: Yes     Diabetic: Yes     Tobacco smoker: Yes     Systolic Blood Pressure: 092 mmHg     Is BP treated: No     HDL Cholesterol: 40 mg/dL     Total Cholesterol: 177 mg/dL   A/P: Diabetes longstanding currently improved since last visit based on home readings. Patient is able to verbalize appropriate hypoglycemia management plan. Medication adherence appears appropriate. Hypoglycemia observed on CGM appears to correct itself quickly.  -Decreased dose of basal insulin Basaglar (insulin glargine) from 67 to 62 given symptomatic hypoglycemia.  -Decreased dose of rapid insulin Novolog (insulin aspart) to 20 units before meals.  -Continued GLP-1 Trulicity (generic dulaglutide) to 4.5 mg weekly.  -Continued metformin 500 mg BID.  -Patient hopes to be able to afford CGM by next visit.  -Extensively discussed pathophysiology of diabetes, recommended lifestyle interventions, dietary effects on blood sugar control.  -Counseled on s/sx of and management of hypoglycemia.  -Next A1c anticipated November 2023.   Total time in face to face counseling 25 minutes.    Follow-up:  Pharmacist 1 month. PCP clinic visit in December 2023.   Joseph Art, Pharm.D. PGY-2 [facility]  Resident 09/16/2022 11:43 AM

## 2022-09-17 ENCOUNTER — Other Ambulatory Visit: Payer: Self-pay

## 2022-09-19 ENCOUNTER — Ambulatory Visit (INDEPENDENT_AMBULATORY_CARE_PROVIDER_SITE_OTHER): Payer: Commercial Managed Care - HMO | Admitting: Podiatry

## 2022-09-19 DIAGNOSIS — M7750 Other enthesopathy of unspecified foot: Secondary | ICD-10-CM

## 2022-09-19 DIAGNOSIS — M722 Plantar fascial fibromatosis: Secondary | ICD-10-CM | POA: Diagnosis not present

## 2022-09-19 MED ORDER — MELOXICAM 15 MG PO TABS: 15.0000 mg | ORAL_TABLET | Freq: Every day | ORAL | 2 refills | Status: DC

## 2022-09-19 NOTE — Progress Notes (Signed)
  Subjective:  Patient ID: Whitney Benton, female    DOB: [birth year],  MRN: [id number]  Chief Complaint  Patient presents with   Plantar Fasciitis    PF on bilateral feet, patient stated insoles are ineffective, not wearing insoles often     51 y.o. female presents with the above complaint. Patient presents complaining of long term heel pain in both feet. Pain worse in morning with first steps out of bed. Pain level varies depending on activity level. Wears flip flops. Has not had orthotics or arch supports in past.    Review of Systems: Negative except as noted in the HPI. Denies N/V/F/Ch.   Objective:  There were no vitals filed for this visit. There is no height or weight on file to calculate BMI. Constitutional Well developed. Well nourished.  Vascular Dorsalis pedis pulses palpable bilaterally. Posterior tibial pulses palpable bilaterally. Capillary refill normal to all digits.  No cyanosis or clubbing noted. Pedal hair growth normal.  Neurologic Normal speech. Oriented to person, place, and time. Epicritic sensation to light touch grossly present bilaterally.  Dermatologic Nails well groomed and normal in appearance. No open wounds. No skin lesions.  Orthopedic: Normal joint ROM without pain or crepitus bilaterally. No visible deformities. Tender to palpation at the calcaneal tuber bilaterally. No pain with calcaneal squeeze bilaterally. Ankle ROM diminished range of motion bilaterally. Silfverskiold Test: deferred bilaterally.   Radiographs: Taken and reviewed. No acute fractures or dislocations. No evidence of stress fracture.  Plantar heel spur absent. Posterior heel spur absent.   Assessment:   1. Tendonitis of ankle or foot   2. Plantar fasciitis, bilateral     Plan:  Patient was evaluated and treated and all questions answered.  Plantar Fasciitis, bilaterally - XR reviewed as above.  - Educated on icing and stretching. Instructions given.  - DME:  Powerstep inserts dispensed.Recommend supportive shoes including running shoes at all times - Pharmacologic management: Meloxicam. Educated on risks/benefits and proper taking of medication. -Patient states she wanted more meloxicam as it was helping.  Sent another Rx for 15 mg meloxicam once daily as needed for pain. -Patient states she does not want shots at this time I explained the importance of using a steroid shot to reduce her pain but she wants to wait and see how the inserts do for her.   Return in about 8 weeks (around 11/14/2022) for Bilateral PF.

## 2022-09-24 ENCOUNTER — Encounter (HOSPITAL_COMMUNITY): Payer: Self-pay | Admitting: Psychiatry

## 2022-09-24 ENCOUNTER — Ambulatory Visit (INDEPENDENT_AMBULATORY_CARE_PROVIDER_SITE_OTHER): Payer: Commercial Managed Care - HMO | Admitting: Psychiatry

## 2022-09-24 DIAGNOSIS — F1994 Other psychoactive substance use, unspecified with psychoactive substance-induced mood disorder: Secondary | ICD-10-CM

## 2022-09-24 DIAGNOSIS — F411 Generalized anxiety disorder: Secondary | ICD-10-CM

## 2022-09-24 DIAGNOSIS — F5101 Primary insomnia: Secondary | ICD-10-CM

## 2022-09-24 MED ORDER — HYDROXYZINE HCL 25 MG PO TABS: ORAL_TABLET | Freq: Three times a day (TID) | ORAL | 3 refills | Status: DC | PRN

## 2022-09-24 MED ORDER — BUSPIRONE HCL 15 MG PO TABS: 15.0000 mg | ORAL_TABLET | Freq: Three times a day (TID) | ORAL | 3 refills | Status: DC

## 2022-09-24 MED ORDER — QUETIAPINE FUMARATE 150 MG PO TABS: 150.0000 mg | ORAL_TABLET | Freq: Every day | ORAL | 3 refills | Status: DC

## 2022-09-24 MED ORDER — ZOLPIDEM TARTRATE 5 MG PO TABS: 5.0000 mg | ORAL_TABLET | Freq: Every evening | ORAL | 1 refills | Status: DC | PRN

## 2022-09-24 NOTE — Progress Notes (Signed)
BH MD/PA/NP OP Progress Note      09/24/2022 11:31 AM Whitney Benton  MRN:  [id number]  Chief Complaint: "I'm still depressed"  HPI: 51 year old female seen today for follow up psychiatric evaluation.  She has a psychiatric history of anxiety and depression.  She is currently managed on Seroquel 100 mg nightly, hydroxyzine 50 mg nightly, gabapentin 300 mg 3 times daily, hydroxyzine 25 mg 3 times daily, Ambien 5 mg nightly, BuSpar 15 mg 3 times daily and Cymbalta 60 mg daily. Today she notes her medications are somewhat effective in managing her psychiatric condition.  Today was well-groomed, pleasant, cooperative, and engaged in conversation.  She informed Probation officer that she continues to be depressed.  She informed Probation officer that she lacks motivation to do things that she once enjoyed.  Patient also notes that her family is a source of her stress.  She informed Probation officer that since her mother died in 30-Dec-2023 her sister and her sister's boyfriend has been staying in her home.  She informed Probation officer that she wants them out.  She also informed Probation officer that her son and his girlfriend are source of her stress as they are not appreciative of the care she provides to her 20-year-old grandson.  Patient notes that her sleep continues to be poor.  She notes that she sleeps approximately 4 hours nightly.    Patient informed writer that the above exacerbates her anxiety and depression.  Today provider conducted a GAD-7 and patient scored a 19, at her last visit she scored a 20.  Provider also conducted PHQ-9 and patient scored an 18, at her last visit she scored a 20.  She endorses fluctuations in appetite and weight.  Patient notes that she will lose/gain 5 pounds.  Today she denies SI/HI/VAH, mania, paranoia.    Patient reports that she continues to have bodily pains.  She notes that gabapentin and Cymbalta are somewhat effective in managing her pain.  Today patient agreeable to increasing Seroquel 100 mg nightly to 150  mg nightly to help manage sleep, anxiety, depression.  She requested that provider not fill Cymbalta at this time.  She will continue her other medications as prescribed.  No other concerns at this time.          . Visit Diagnosis:    ICD-10-CM   1. Substance induced mood disorder (HCC)  F19.94 busPIRone (BUSPAR) 15 MG tablet    QUEtiapine 150 MG TABS    2. Generalized anxiety disorder  F41.1 busPIRone (BUSPAR) 15 MG tablet    hydrOXYzine (ATARAX) 25 MG tablet    QUEtiapine 150 MG TABS    3. Primary insomnia  F51.01 QUEtiapine 150 MG TABS    zolpidem (AMBIEN) 5 MG tablet       Past Psychiatric History: anxiety and depression  Past Medical History:  Past Medical History:  Diagnosis Date   Anxiety    Asthma    Depression    Diabetes mellitus without complication ([place])    Glaucoma suspect    Trichomonas infection     Past Surgical History:  Procedure Laterality Date   CESAREAN SECTION     UPPER GASTROINTESTINAL ENDOSCOPY     VAGINAL HYSTERECTOMY  12/23/2004   Fibroids, menorrhagia, benign pathology    Family Psychiatric History: Mother schizophrenia and bipolar disorder  Family History:  Family History  Problem Relation Age of Onset   Diabetes Mother    Mental illness Mother    Depression Mother    Hypertension Mother  Colon cancer Father 55   Hypertension Father    Diabetes Father    Colon cancer Paternal Grandmother    Esophageal cancer Neg Hx    Stomach cancer Neg Hx     Social History:  Social History   Socioeconomic History   Marital status: Significant Other    Spouse name: Not on file   Number of children: 3   Years of education: 14   Highest education level: Not on file  Occupational History   Not on file  Tobacco Use   Smoking status: Former    Packs/day: 0.25    Years: 8.00    Total pack years: 2.00    Types: Cigarettes   Smokeless tobacco: Never  Vaping Use   Vaping Use: Never used  Substance and Sexual Activity   Alcohol use:  Not Currently    Comment: occasional   Drug use: No   Sexual activity: Yes    Birth control/protection: Surgical  Other Topics Concern   Not on file  Social History Narrative   Patient lives at home with mother and father , one story    Patient has 3 children    Patient is single   Patient has 14 years of education    Patient is right handed    Caffeine none   Social Determinants of Health   Financial Resource Strain: Not on file  Food Insecurity: Food Insecurity Present (05/24/2022)   Hunger Vital Sign    Worried About Running Out of Food in the Last Year: Sometimes true    Ran Out of Food in the Last Year: Sometimes true  Transportation Needs: No Transportation Needs (01/30/2021)   PRAPARE - Hydrologist (Medical): No    Lack of Transportation (Non-Medical): No  Physical Activity: Not on file  Stress: Not on file  Social Connections: Not on file    Allergies:  Allergies  Allergen Reactions   Aspirin Hives   Oxycodone Nausea And Vomiting    Metabolic Disorder Labs: Lab Results  Component Value Date   HGBA1C 10.2 (H) 07/25/2022   MPG 246 07/25/2022   MPG 380.93 10/02/2019   No results found for: "PROLACTIN" Lab Results  Component Value Date   CHOL 177 11/30/2021   TRIG 181 (H) 11/30/2021   HDL 40 11/30/2021   CHOLHDL 4.4 11/30/2021   VLDL 19 11/14/2014   LDLCALC 105 (H) 11/30/2021   LDLCALC 92 02/04/2020   Lab Results  Component Value Date   TSH 2.460 04/30/2021   TSH 0.014 (L) 10/02/2019    Therapeutic Level Labs: No results found for: "LITHIUM" No results found for: "VALPROATE" No results found for: "CBMZ"  Current Medications: Current Outpatient Medications  Medication Sig Dispense Refill   albuterol (VENTOLIN HFA) 108 (90 Base) MCG/ACT inhaler Inhale 2 puffs into the lungs every 6 (six) hours as needed for wheezing or shortness of breath (Cough). 18 g 0   atorvastatin (LIPITOR) 40 MG tablet Take 1 tablet (40 mg total)  by mouth daily. 90 tablet 2   Blood Glucose Monitoring Suppl (TRUE METRIX METER) w/Device KIT Check blood sugars three times a day 1 kit 0   busPIRone (BUSPAR) 15 MG tablet Take 1 tablet (15 mg total) by mouth 3 (three) times daily. 90 tablet 3   conjugated estrogens (PREMARIN) vaginal cream Apply 0.5 gram intravaginally 2 times a week 42.5 g 1   Continuous Blood Gluc Receiver (FREESTYLE LIBRE 2 READER) DEVI Use to check blood  sugar three times daily. 1 each 0   Continuous Blood Gluc Sensor (FREESTYLE LIBRE 2 SENSOR) MISC Use to check blood sugar three times daily. 2 each 3   Continuous Blood Gluc Sensor (FREESTYLE LIBRE 2 SENSOR) MISC take as directed 1 each 0   cycloSPORINE (RESTASIS) 0.05 % ophthalmic emulsion Apply 1 drop into both eyes twice a day 180 each 3   diclofenac (VOLTAREN) 75 MG EC tablet Take 1 tablet (75 mg total) by mouth 2 (two) times daily. 30 tablet 2   Dulaglutide (TRULICITY) 4.5 JK/0.9FG SOPN Inject 4.5 mg as directed once a week. 2 mL 3   DULoxetine (CYMBALTA) 20 MG capsule Take 1 capsule (20 mg total) by mouth daily. 30 capsule 3   DULoxetine (CYMBALTA) 60 MG capsule Take 1 capsule (60 mg total) by mouth daily. 30 capsule 3   Erenumab-aooe (AIMOVIG) 140 MG/ML SOAJ Inject 140 mg into the skin every 28 (twenty-eight) days. 1.12 mL 5   fexofenadine (ALLEGRA) 180 MG tablet Take 1 tablet (180 mg total) by mouth daily. 90 tablet 1   fluorometholone (FML) 0.1 % ophthalmic suspension Instill 1 drop into both eyes as directed 1 drop 4 times day for 2 weeks then 1 drop 2 times day for 2 weeks 10 mL 0   fluticasone (FLONASE) 50 MCG/ACT nasal spray Place 1 spray into both nostrils daily. Begin by using 2 sprays in each nare daily for 3 to 5 days, then decrease to 1 spray in each nare daily. 32 mL 1   glucose blood (TRUE METRIX BLOOD GLUCOSE TEST) test strip Use to check blood sugar 3 times daily 100 each 2   hydrOXYzine (ATARAX) 25 MG tablet TAKE 1 TABLET (25 MG TOTAL) BY MOUTH 3 (THREE)  TIMES DAILY AS NEEDED. 90 tablet 3   insulin aspart (NOVOLOG FLEXPEN) 100 UNIT/ML FlexPen INJECT 20 UNITS BEFORE BREAKFAST, 20 UNITS BEFORE LUNCH, AND 20 UNITS BEFORE DINNER. 15 mL 2   Insulin Glargine (BASAGLAR KWIKPEN) 100 UNIT/ML Inject 62 Units into the skin at bedtime. 21 mL 2   Insulin Pen Needle (PEN NEEDLES) 31G X 6 MM MISC Use as directed 100 each 0   meloxicam (MOBIC) 15 MG tablet Take 1 tablet (15 mg total) by mouth daily. 30 tablet 2   metFORMIN (GLUCOPHAGE-XR) 500 MG 24 hr tablet Take 1 tablet (500 mg total) by mouth 2 (two) times daily. 180 tablet 1   methocarbamol (ROBAXIN) 500 MG tablet Take 1 tablet (500 mg total) by mouth 3 (three) times daily as needed for muscle spasms. 20 tablet 2   metoprolol tartrate (LOPRESSOR) 25 MG tablet Take 0.5 tablets (12.5 mg total) by mouth 2 (two) times daily. 90 tablet 2   Multiple Vitamin (MULTIVITAMIN ADULT) TABS Take 1 tablet by mouth daily.     Multiple Vitamins-Minerals (EYE VITAMINS PO) Take by mouth.     ondansetron (ZOFRAN-ODT) 4 MG disintegrating tablet Take 1 tablet (4 mg total) by mouth every 8 (eight) hours as needed for nausea or vomiting. 20 tablet 0   predniSONE (STERAPRED UNI-PAK 21 TAB) 5 MG (21) TBPK tablet Take as directed 21 tablet 0   QUEtiapine 150 MG TABS Take 150 mg by mouth at bedtime. 30 tablet 3   SUMAtriptan (IMITREX) 100 MG tablet TAKE 1 TABLET EARLIEST ONSET OF MIGRAINE. MAY REPEAT IN 2 HOURS IF HEADACHE PERSISTS OR RECURS. MAXIMUM 2 TABLETS IN 24 HOURS. 10 tablet 5   triamcinolone cream (KENALOG) 0.1 % Apply 1 Application topically 2 (two)  times daily. 30 g 0   TRUEPLUS LANCETS 28G MISC Check blood sugars three time a day 100 each 12   zolpidem (AMBIEN) 5 MG tablet Take 1 tablet (5 mg total) by mouth at bedtime as needed for sleep. 30 tablet 1   No current facility-administered medications for this visit.     Musculoskeletal: Strength & Muscle Tone: within normal limits Gait & Station: normal Patient leans:  N/A  Psychiatric Specialty Exam: Review of Systems  There were no vitals taken for this visit.There is no height or weight on file to calculate BMI.  General Appearance: Well Groomed  Eye Contact:  Good  Speech:  Clear and Coherent  Volume:  Normal  Mood:  Anxious and Depressed  Affect:  Appropriate and Congruent  Thought Process:  Coherent, Goal Directed, and Linear  Orientation:  Full (Time, Place, and Person)  Thought Content: WDL and Logical   Suicidal Thoughts:  No  Homicidal Thoughts:  No  Memory:  Immediate;   Good Recent;   Good Remote;   Good  Judgement:  Good  Insight:  Good  Psychomotor Activity:  Normal  Concentration:  Concentration: Good and Attention Span: Good  Recall:  Good  Fund of Knowledge: Good  Language: Good  Akathisia:  No  Handed:  Right  AIMS (if indicated): not done  Assets:  Communication Skills Desire for Improvement Housing Intimacy Physical Health Social Support  ADL's:  Intact  Cognition: WNL  Sleep:  Fair   Screenings: GAD-7    Flowsheet Row Clinical Support from 09/24/2022 in [facility] Video Visit from 06/24/2022 in [facility] Office Visit from 04/04/2022 in [place] Video Visit from 01/16/2022 in [facility] Office Visit from 11/30/2021 in [place]  Total GAD-7 Score _0 PHQ2-9    Flowsheet Row Clinical Support from 09/24/2022 in [facility] Video Visit from 06/24/2022 in [facility] Nutrition from 05/24/2022 in [facility] Office Visit from 04/04/2022 in [place] Video Visit from 01/16/2022 in [facility]  PHQ-2 Total Score 5 6 0 4 6  PHQ-9 Total Score 18 20 -- 18 [place] [facility] from 09/01/2022 in [facility] ED from 04/29/2022 in [facility] [place] from 03/22/2021 in [place] No Risk No Risk No Risk        Assessment and Plan: Patient endorses symptoms of anxiety, depression, poor sleep, and pain.  She request that provider not fill the Cymbalta as she has more refills.  She is agreeable to increasing Seroquel 100 mg to 150 mg to help with anxiety, depression, and sleep.  She will continue all medications as prescribed.   1. Substance induced mood disorder (HCC)  Continue- busPIRone (BUSPAR) 15 MG tablet; Take 1 tablet (15 mg total) by mouth 3 (three) times daily.  Dispense: 90 tablet; Refill: 3 Increased- QUEtiapine 150 MG TABS; Take 150 mg by mouth at bedtime.  Dispense: 30 tablet; Refill: 3  2. Generalized anxiety disorder  Continue- busPIRone (BUSPAR) 15 MG tablet; Take 1 tablet (15 mg total) by mouth 3 (three) times daily.  Dispense: 90 tablet; Refill: 3 Continue- hydrOXYzine (ATARAX) 25 MG tablet; TAKE  1 TABLET (25 MG TOTAL) BY MOUTH 3 (THREE) TIMES DAILY AS NEEDED.  Dispense: 90 tablet; Refill: 3 Increased- QUEtiapine 150 MG TABS; Take 150 mg by mouth at bedtime.  Dispense: 30 tablet; Refill: 3  3. Primary insomnia  Increased- QUEtiapine 150 MG TABS; Take 150 mg by mouth at bedtime.  Dispense: 30 tablet; Refill: 3 Continue- zolpidem (AMBIEN) 5 MG tablet; Take 1 tablet (5 mg total) by mouth at bedtime as needed for sleep.  Dispense: 30 tablet; Refill: 1     Follow-up in 3 months Follow-up with therapy   Salley Slaughter, NP 09/24/2022, 11:31 AM

## 2022-09-25 ENCOUNTER — Ambulatory Visit (HOSPITAL_COMMUNITY): Payer: Commercial Managed Care - HMO | Admitting: Clinical

## 2022-09-30 ENCOUNTER — Encounter: Payer: Commercial Managed Care - HMO | Admitting: Registered"

## 2022-10-06 ENCOUNTER — Emergency Department (HOSPITAL_COMMUNITY)
Admission: EM | Admit: 2022-10-06 | Discharge: 2022-10-06 | Disposition: A | Discharge status: Home / Self Care | Payer: Commercial Managed Care - HMO | Attending: Emergency Medicine | Admitting: Emergency Medicine

## 2022-10-06 ENCOUNTER — Other Ambulatory Visit: Payer: Self-pay

## 2022-10-06 ENCOUNTER — Emergency Department (HOSPITAL_COMMUNITY): Payer: Commercial Managed Care - HMO

## 2022-10-06 DIAGNOSIS — R0789 Other chest pain: Secondary | ICD-10-CM | POA: Insufficient documentation

## 2022-10-06 DIAGNOSIS — Y92410 Unspecified street and highway as the place of occurrence of the external cause: Secondary | ICD-10-CM | POA: Insufficient documentation

## 2022-10-06 DIAGNOSIS — E119 Type 2 diabetes mellitus without complications: Secondary | ICD-10-CM | POA: Diagnosis not present

## 2022-10-06 DIAGNOSIS — M79602 Pain in left arm: Secondary | ICD-10-CM | POA: Diagnosis not present

## 2022-10-06 DIAGNOSIS — Z794 Long term (current) use of insulin: Secondary | ICD-10-CM | POA: Diagnosis not present

## 2022-10-06 DIAGNOSIS — M542 Cervicalgia: Secondary | ICD-10-CM | POA: Insufficient documentation

## 2022-10-06 DIAGNOSIS — Z79899 Other long term (current) drug therapy: Secondary | ICD-10-CM | POA: Insufficient documentation

## 2022-10-06 MED ORDER — ACETAMINOPHEN 500 MG PO TABS: 500.0000 mg | ORAL_TABLET | Freq: Four times a day (QID) | ORAL | 0 refills | Status: DC | PRN

## 2022-10-06 MED ORDER — LIDOCAINE 5 % EX PTCH: 1.0000 | MEDICATED_PATCH | CUTANEOUS | 0 refills | Status: DC
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
## 2022-10-06 NOTE — Discharge Instructions (Signed)
Your x-ray and CT scan are normal.  Your EKG does not show any signs of heart attack.  You will likely be increasingly sore over the next couple of days.  You may use Tylenol and heat packs for this.  I have also sent lidocaine patches to your pharmacy.  You may wear up to 3 of these at the same time but now that you must have 12 hours patch free every day.  Return to the emergency department with any further concerns, otherwise follow-up with your PCP in a week if you are not feeling any better.

## 2022-10-07 ENCOUNTER — Telehealth: Payer: Self-pay | Admitting: Internal Medicine

## 2022-10-07 NOTE — Telephone Encounter (Signed)
Patient called to speak with a nurse states she is having chronic abdominal pain.

## 2022-10-08 ENCOUNTER — Other Ambulatory Visit: Payer: Self-pay

## 2022-10-08 NOTE — Telephone Encounter (Signed)
Pt reports she has been having pain in the middle of her stomach that seems to be constant. Reports is has been going on for a few weeks but seems to be getting worse. Reports sometimes it is worse depending on what she eats. Pt scheduled to see Tye Savoy NP tomorrow at 9:15am. Pt aware of appt.

## 2022-10-08 NOTE — Telephone Encounter (Signed)
Left message for pt to call back  °

## 2022-10-09 ENCOUNTER — Other Ambulatory Visit (INDEPENDENT_AMBULATORY_CARE_PROVIDER_SITE_OTHER): Payer: Commercial Managed Care - HMO

## 2022-10-09 ENCOUNTER — Ambulatory Visit (INDEPENDENT_AMBULATORY_CARE_PROVIDER_SITE_OTHER): Payer: Commercial Managed Care - HMO | Admitting: Nurse Practitioner

## 2022-10-09 ENCOUNTER — Encounter: Payer: Self-pay | Admitting: Nurse Practitioner

## 2022-10-09 VITALS — BP 130/82 | HR 88 | Ht 63.0 in | Wt 232.0 lb

## 2022-10-09 DIAGNOSIS — R1010 Upper abdominal pain, unspecified: Secondary | ICD-10-CM | POA: Diagnosis not present

## 2022-10-09 DIAGNOSIS — R110 Nausea: Secondary | ICD-10-CM

## 2022-10-09 LAB — CBC
HCT: 35.8 % — ABNORMAL LOW (ref 36.0–46.0)
Hemoglobin: 11.9 g/dL — ABNORMAL LOW (ref 12.0–15.0)
MCHC: 33.1 g/dL (ref 30.0–36.0)
MCV: 85.7 fl (ref 78.0–100.0)
Platelets: 343 10*3/uL (ref 150.0–400.0)
RBC: 4.17 Mil/uL (ref 3.87–5.11)
RDW: 15.4 % (ref 11.5–15.5)
WBC: 6.2 10*3/uL (ref 4.0–10.5)

## 2022-10-09 LAB — COMPREHENSIVE METABOLIC PANEL
ALT: 15 U/L (ref 0–35)
AST: 13 U/L (ref 0–37)
Albumin: 3.9 g/dL (ref 3.5–5.2)
Alkaline Phosphatase: 129 U/L — ABNORMAL HIGH (ref 39–117)
BUN: 9 mg/dL (ref 6–23)
CO2: 30 mEq/L (ref 19–32)
Calcium: 9 mg/dL (ref 8.4–10.5)
Chloride: 103 mEq/L (ref 96–112)
Creatinine, Ser: 0.85 mg/dL (ref 0.40–1.20)
GFR: 78.96 mL/min (ref >60.00)
Glucose, Bld: 273 mg/dL — ABNORMAL HIGH (ref 70–99)
Potassium: 3.8 mEq/L (ref 3.5–5.1)
Sodium: 139 mEq/L (ref 135–145)
Total Bilirubin: 0.3 mg/dL (ref 0.2–1.2)
Total Protein: 6.6 g/dL (ref 6.0–8.3)

## 2022-10-09 LAB — LIPASE: Lipase: 157 U/L — ABNORMAL HIGH (ref 11.0–59.0)

## 2022-10-09 MED ORDER — OMEPRAZOLE 40 MG PO CPDR: 40.0000 mg | DELAYED_RELEASE_CAPSULE | Freq: Every day | ORAL | 3 refills | Status: DC

## 2022-10-09 NOTE — Patient Instructions (Addendum)
Your provider has requested that you go to the basement level for lab work before leaving today. Press "B" on the elevator. The lab is located at the first door on the left as you exit the elevator.  You have been scheduled for an endoscopy. Please follow written instructions given to you at your visit today. If you use inhalers (even only as needed), please bring them with you on the day of your procedure.   We have sent the following medications to your pharmacy for you to pick up at your convenience: Omeprazole   _______________________________________________________  If you are age 28 or older, your body mass index should be between 23-30. Your Body mass index is 41.1 kg/m. If this is out of the aforementioned range listed, please consider follow up with your Primary Care Provider.  If you are age 22 or younger, your body mass index should be between 19-25. Your Body mass index is 41.1 kg/m. If this is out of the aformentioned range listed, please consider follow up with your Primary Care Provider.   ________________________________________________________  The [facility] providers would like to encourage you to use [facility] to communicate with providers for non-urgent requests or questions.  Due to long hold times on the telephone, sending your provider a message by [facility] may be a faster and more efficient way to get a response.  Please allow 48 business hours for a response.  Please remember that this is for non-urgent requests.  _______________________________________________________  Thank you for choosing me and [facility].  Tye Savoy NP

## 2022-10-09 NOTE — Progress Notes (Signed)
[place] and plan as noted

## 2022-10-09 NOTE — Progress Notes (Signed)
Chief Complaint:  upper abdominal pain    Assessment &  Plan   # 51 yo female with a two week history of intermittent, generalized upper abdominal pain and nausea on Mobic . Rule out PUD. Biliary disease less likely.  Obtain basic labs including CMP, CBC, lipase Schedule for EGD. The risks and benefits of EGD with possible biopsies were discussed with the patient who agrees to proceed.  Trial of Omeprazole 40 mg q am Advised judicious use of Mobic until workup of abdominal pain is complete. If EGD negative and symptoms persist consider abdominal imaging.    # History of mild elevation of alk phos ( dating back years). Alk phos 141 at last check in Dec 2022. Remainder of liver chemistries normal. No liver abnormalities on CTAP with contrast Aug 2021.  Will see what updated labs shows. normalized.   # Uncontrolled DM.   HPI   Patient is a 51 year old female with a history of anxiety, asthma, headaches, depression, DM2, diabetes, possible glaucoma.  See PMH for additional history.  Patient known to Dr. Henrene Pastor.  She had a screening colonoscopy in July of this year.  She has a family history of colon cancer.   Interval History:  Whitney Benton is here with a about a two week history of intermittent generalized upper abdominal pain. Pain characterized as a non-radiating, dull ache located in epigastrium / mid abdomen. The pain often gets worse if she eats but it also wakes her up at night and can get worse if she bends over.   She has associated nausea without vomiting She describes early satiety but denies associated weight loss. She hasn't tried anything to relieve the pain.  She takes several routine medications. Voltaren on home med list but hasn't taken it ( she didn't even know what it was for).  She has been taking daily Mobic for foot pain since the end of August. On Trulicity since 9826   Bowel habits are at baseline. No blood in stool .  Previous GI Evaluation   July 2023 screening  colonoscopy.  Family history colon cancer. -1 mm colon polyp removed -Follow-up in 5 years given family history colon cancer COLONIC MUCOSA WITH NO FEATURES DIAGNOSTIC FOR A SPECIFIC TYPE OF MUCOSAL POLYP (MICROSCOPICALLY EXAMINED AT 3 ADDITIONAL H&E LEVELS  Imaging   Labs:     Latest Ref Rng & Units 11/30/2021   10:39 AM 01/25/2021    3:34 AM 01/23/2021   12:43 PM  CBC  WBC 3.4 - 10.8 x10E3/uL 6.1  9.9    Hemoglobin 11.1 - 15.9 g/dL 13.5  12.5  18.4   Hematocrit 34.0 - 46.6 % 40.9  36.2  54.0   Platelets 150 - 450 x10E3/uL 372  303         Latest Ref Rng & Units 11/30/2021   10:39 AM 08/10/2020    9:00 PM 10/03/2019    6:33 AM  Hepatic Function  Total Protein 6.0 - 8.5 g/dL 7.0  8.6  6.7   Albumin 3.8 - 4.9 g/dL 4.4  4.6  3.7   AST 0 - 40 IU/L _0 ALT 0 - 32 IU/L _1 Alk Phosphatase 44 - 121 IU/L 141  163  84   Total Bilirubin 0.0 - 1.2 mg/dL 0.3  0.4  1.3   Bilirubin, Direct 0.00 - 0.40 mg/dL <0.10   0.1     Past Medical History:  Diagnosis Date  Anxiety    Asthma    Depression    Diabetes mellitus without complication ([place])    Glaucoma suspect    Trichomonas infection     Past Surgical History:  Procedure Laterality Date   CESAREAN SECTION     UPPER GASTROINTESTINAL ENDOSCOPY     VAGINAL HYSTERECTOMY  12/23/2004   Fibroids, menorrhagia, benign pathology    Current Medications, Allergies, Family History and Social History were reviewed in [employer] record.     Current Outpatient Medications  Medication Sig Dispense Refill   acetaminophen (TYLENOL) 500 MG tablet Take 1 tablet (500 mg total) by mouth every 6 (six) hours as needed. 30 tablet 0   albuterol (VENTOLIN HFA) 108 (90 Base) MCG/ACT inhaler Inhale 2 puffs into the lungs every 6 (six) hours as needed for wheezing or shortness of breath (Cough). 18 g 0   atorvastatin (LIPITOR) 40 MG tablet Take 1 tablet (40 mg total) by mouth daily. 90 tablet 2   Blood Glucose  Monitoring Suppl (TRUE METRIX METER) w/Device KIT Check blood sugars three times a day 1 kit 0   busPIRone (BUSPAR) 15 MG tablet Take 1 tablet (15 mg total) by mouth 3 (three) times daily. 90 tablet 3   conjugated estrogens (PREMARIN) vaginal cream Apply 0.5 gram intravaginally 2 times a week 42.5 g 1   Continuous Blood Gluc Receiver (FREESTYLE LIBRE 2 READER) DEVI Use to check blood sugar three times daily. 1 each 0   Continuous Blood Gluc Sensor (FREESTYLE LIBRE 2 SENSOR) MISC Use to check blood sugar three times daily. 2 each 3   Continuous Blood Gluc Sensor (FREESTYLE LIBRE 2 SENSOR) MISC take as directed 1 each 0   cycloSPORINE (RESTASIS) 0.05 % ophthalmic emulsion Apply 1 drop into both eyes twice a day 180 each 3   diclofenac (VOLTAREN) 75 MG EC tablet Take 1 tablet (75 mg total) by mouth 2 (two) times daily. 30 tablet 2   Dulaglutide (TRULICITY) 4.5 DE/0.8XK SOPN Inject 4.5 mg as directed once a week. 2 mL 3   DULoxetine (CYMBALTA) 20 MG capsule Take 1 capsule (20 mg total) by mouth daily. 30 capsule 3   DULoxetine (CYMBALTA) 60 MG capsule Take 1 capsule (60 mg total) by mouth daily. 30 capsule 3   Erenumab-aooe (AIMOVIG) 140 MG/ML SOAJ Inject 140 mg into the skin every 28 (twenty-eight) days. 1.12 mL 5   fexofenadine (ALLEGRA) 180 MG tablet Take 1 tablet (180 mg total) by mouth daily. 90 tablet 1   fluorometholone (FML) 0.1 % ophthalmic suspension Instill 1 drop into both eyes as directed 1 drop 4 times day for 2 weeks then 1 drop 2 times day for 2 weeks 10 mL 0   fluticasone (FLONASE) 50 MCG/ACT nasal spray Place 1 spray into both nostrils daily. Begin by using 2 sprays in each nare daily for 3 to 5 days, then decrease to 1 spray in each nare daily. 32 mL 1   glucose blood (TRUE METRIX BLOOD GLUCOSE TEST) test strip Use to check blood sugar 3 times daily 100 each 2   hydrOXYzine (ATARAX) 25 MG tablet TAKE 1 TABLET (25 MG TOTAL) BY MOUTH 3 (THREE) TIMES DAILY AS NEEDED. 90 tablet 3    insulin aspart (NOVOLOG FLEXPEN) 100 UNIT/ML FlexPen INJECT 20 UNITS BEFORE BREAKFAST, 20 UNITS BEFORE LUNCH, AND 20 UNITS BEFORE DINNER. 15 mL 2   Insulin Glargine (BASAGLAR KWIKPEN) 100 UNIT/ML Inject 62 Units into the skin at bedtime. 21 mL 2  Insulin Pen Needle (PEN NEEDLES) 31G X 6 MM MISC Use as directed 100 each 0   lidocaine (LIDODERM) 5 % Place 1 patch onto the skin daily. Remove & Discard patch within 12 hours or as directed by MD 30 patch 0   meloxicam (MOBIC) 15 MG tablet Take 1 tablet (15 mg total) by mouth daily. 30 tablet 2   metFORMIN (GLUCOPHAGE-XR) 500 MG 24 hr tablet Take 1 tablet (500 mg total) by mouth 2 (two) times daily. 180 tablet 1   methocarbamol (ROBAXIN) 500 MG tablet Take 1 tablet (500 mg total) by mouth 3 (three) times daily as needed for muscle spasms. 20 tablet 2   metoprolol tartrate (LOPRESSOR) 25 MG tablet Take 0.5 tablets (12.5 mg total) by mouth 2 (two) times daily. 90 tablet 2   Multiple Vitamin (MULTIVITAMIN ADULT) TABS Take 1 tablet by mouth daily.     Multiple Vitamins-Minerals (EYE VITAMINS PO) Take by mouth.     ondansetron (ZOFRAN-ODT) 4 MG disintegrating tablet Take 1 tablet (4 mg total) by mouth every 8 (eight) hours as needed for nausea or vomiting. 20 tablet 0   predniSONE (STERAPRED UNI-PAK 21 TAB) 5 MG (21) TBPK tablet Take as directed 21 tablet 0   QUEtiapine 150 MG TABS Take 150 mg by mouth at bedtime. 30 tablet 3   SUMAtriptan (IMITREX) 100 MG tablet TAKE 1 TABLET EARLIEST ONSET OF MIGRAINE. MAY REPEAT IN 2 HOURS IF HEADACHE PERSISTS OR RECURS. MAXIMUM 2 TABLETS IN 24 HOURS. 10 tablet 5   triamcinolone cream (KENALOG) 0.1 % Apply 1 Application topically 2 (two) times daily. 30 g 0   TRUEPLUS LANCETS 28G MISC Check blood sugars three time a day 100 each 12   zolpidem (AMBIEN) 5 MG tablet Take 1 tablet (5 mg total) by mouth at bedtime as needed for sleep. 30 tablet 1   No current facility-administered medications for this visit.    Review of  Systems: No chest pain. No shortness of breath. No urinary complaints.    Physical Exam  Wt Readings from Last 3 Encounters:  10/06/22 218 lb (98.9 kg)  09/10/22 231 lb (104.8 kg)  08/12/22 222 lb 3.2 oz (100.8 kg)    BP 130/82   Pulse 88   Ht _0  (1.6 m)   Wt 232 lb (105.2 kg)   SpO2 96%   BMI 41.10 kg/m  Constitutional:  Generally well appearing female in no acute distress. Psychiatric: Pleasant. Normal mood and affect. Behavior is normal. EENT: Pupils normal.  Conjunctivae are normal. No scleral icterus. Neck supple.  Cardiovascular: Normal rate, regular rhythm. No edema Pulmonary/chest: Effort normal and breath sounds normal. No wheezing, rales or rhonchi. Abdominal: Soft, nondistended, nontender. Bowel sounds active throughout. There are no masses palpable. No hepatomegaly. Neurological: Alert and oriented to person place and time. Skin: Skin is warm and dry. No rashes noted.  Whitney Savoy, NP  10/09/2022, 8:38 AM

## 2022-10-10 ENCOUNTER — Other Ambulatory Visit (HOSPITAL_COMMUNITY): Payer: Self-pay | Admitting: Psychiatry

## 2022-10-10 DIAGNOSIS — F411 Generalized anxiety disorder: Secondary | ICD-10-CM

## 2022-10-10 DIAGNOSIS — F1994 Other psychoactive substance use, unspecified with psychoactive substance-induced mood disorder: Secondary | ICD-10-CM

## 2022-10-15 ENCOUNTER — Other Ambulatory Visit: Payer: Self-pay

## 2022-10-15 DIAGNOSIS — R110 Nausea: Secondary | ICD-10-CM

## 2022-10-15 DIAGNOSIS — R1010 Upper abdominal pain, unspecified: Secondary | ICD-10-CM

## 2022-10-16 ENCOUNTER — Encounter: Payer: Self-pay | Admitting: Physician Assistant

## 2022-10-16 ENCOUNTER — Ambulatory Visit (INDEPENDENT_AMBULATORY_CARE_PROVIDER_SITE_OTHER): Payer: Commercial Managed Care - HMO

## 2022-10-16 ENCOUNTER — Ambulatory Visit: Payer: Commercial Managed Care - HMO | Attending: Physician Assistant | Admitting: Physician Assistant

## 2022-10-16 VITALS — BP 126/84 | HR 81 | Ht 63.0 in | Wt 230.0 lb

## 2022-10-16 DIAGNOSIS — R002 Palpitations: Secondary | ICD-10-CM

## 2022-10-16 DIAGNOSIS — E782 Mixed hyperlipidemia: Secondary | ICD-10-CM | POA: Diagnosis not present

## 2022-10-16 DIAGNOSIS — Z794 Long term (current) use of insulin: Secondary | ICD-10-CM

## 2022-10-16 DIAGNOSIS — R000 Tachycardia, unspecified: Secondary | ICD-10-CM | POA: Diagnosis not present

## 2022-10-16 DIAGNOSIS — E1165 Type 2 diabetes mellitus with hyperglycemia: Secondary | ICD-10-CM | POA: Diagnosis not present

## 2022-10-16 NOTE — Progress Notes (Signed)
Cardiology Office Note:    Date:  10/16/2022   ID:  Whitney Benton, DOB 16-Nov-1971, MRN [id number]  PCP:  Ladell Pier, MD  [facility] Cardiologist:  Freada Bergeron, MD  [facility] Electrophysiologist:  None   Chief Complaint: 6 months follow up   History of Present Illness:    Whitney Benton is a 51 y.o. female with a hx of palpitation, DMII, HLD, obesity, former smoker and depression who returns to clinic for follow-up.   Was initially seen in clinic on 07/16/2021 for sinus tachycardia associated with mild SOB and DOE. Unfortunately, she did not have insurance and therefore we did not obtain monitor or echo at that time. We started her on metoprolol 12.67m BID. We recommended her for the orange card as well.  Last seen by Dr. January 2023.  Reported chest pain and shortness of breath.  Plan was to get coronary CTA and echocardiogram after she has insurance in a month.  Coronary CT February 2023 with calcium score of 1.  83 percentile.  Mild plaque diffusely.  Seen by GI last week for intermittent upper abdominal pain.  Has pending right upper quadrant abdominal ultrasound.  Plan for EGD as well.  Patient is here for follow-up.  She reports improved palpitation but still ongoing for 2 times per week.  Each episode lasted for couple of hours.  She feels dizzy and lightheaded.  Occasional shortness of breath.  She lives with son and needs to climb two-story steps.  Gets shortness of breath without chest tightness.  Denies orthopnea, PND, syncope, lower extremity edema or melena.  Past Medical History:  Diagnosis Date   Anxiety    Asthma    Depression    Diabetes mellitus without complication ([place]    Glaucoma suspect    Trichomonas infection     Past Surgical History:  Procedure Laterality Date   CESAREAN SECTION     UPPER GASTROINTESTINAL ENDOSCOPY     VAGINAL HYSTERECTOMY  12/23/2004   Fibroids, menorrhagia, benign pathology    Current  Medications: Current Meds  Medication Sig   acetaminophen (TYLENOL) 500 MG tablet Take 1 tablet (500 mg total) by mouth every 6 (six) hours as needed.   albuterol (VENTOLIN HFA) 108 (90 Base) MCG/ACT inhaler Inhale 2 puffs into the lungs every 6 (six) hours as needed for wheezing or shortness of breath (Cough).   atorvastatin (LIPITOR) 40 MG tablet Take 1 tablet (40 mg total) by mouth daily.   Blood Glucose Monitoring Suppl (TRUE METRIX METER) w/Device KIT Check blood sugars three times a day   busPIRone (BUSPAR) 15 MG tablet Take 1 tablet (15 mg total) by mouth 3 (three) times daily.   conjugated estrogens (PREMARIN) vaginal cream Apply 0.5 gram intravaginally 2 times a week   Continuous Blood Gluc Receiver (FREESTYLE LIBRE 2 READER) DEVI Use to check blood sugar three times daily.   Continuous Blood Gluc Sensor (FREESTYLE LIBRE 2 SENSOR) MISC Use to check blood sugar three times daily.   Continuous Blood Gluc Sensor (FREESTYLE LIBRE 2 SENSOR) MISC take as directed   cycloSPORINE (RESTASIS) 0.05 % ophthalmic emulsion Apply 1 drop into both eyes twice a day   Dulaglutide (TRULICITY) 4.5 MJH/4.1DESOPN Inject 4.5 mg as directed once a week.   DULoxetine (CYMBALTA) 20 MG capsule Take 1 capsule (20 mg total) by mouth daily.   DULoxetine (CYMBALTA) 60 MG capsule Take 1 capsule (60 mg total) by mouth daily.   Erenumab-aooe (AIMOVIG) 140 MG/ML  SOAJ Inject 140 mg into the skin every 28 (twenty-eight) days.   fexofenadine (ALLEGRA) 180 MG tablet Take 1 tablet (180 mg total) by mouth daily.   fluticasone (FLONASE) 50 MCG/ACT nasal spray Place 1 spray into both nostrils daily. Begin by using 2 sprays in each nare daily for 3 to 5 days, then decrease to 1 spray in each nare daily.   gabapentin (NEURONTIN) 300 MG capsule TAKE 1 CAPSULE BY MOUTH THREE TIMES DAILY   glucose blood (TRUE METRIX BLOOD GLUCOSE TEST) test strip Use to check blood sugar 3 times daily   hydrOXYzine (ATARAX) 25 MG tablet TAKE 1 TABLET  (25 MG TOTAL) BY MOUTH 3 (THREE) TIMES DAILY AS NEEDED.   insulin aspart (NOVOLOG FLEXPEN) 100 UNIT/ML FlexPen INJECT 20 UNITS BEFORE BREAKFAST, 20 UNITS BEFORE LUNCH, AND 20 UNITS BEFORE DINNER.   Insulin Glargine (BASAGLAR KWIKPEN) 100 UNIT/ML Inject 62 Units into the skin at bedtime. (Patient taking differently: Inject 62 Units into the skin in the morning.)   Insulin Pen Needle (PEN NEEDLES) 31G X 6 MM MISC Use as directed   lidocaine (LIDODERM) 5 % Place 1 patch onto the skin daily. Remove & Discard patch within 12 hours or as directed by MD   meloxicam (MOBIC) 15 MG tablet Take 1 tablet (15 mg total) by mouth daily.   metFORMIN (GLUCOPHAGE-XR) 500 MG 24 hr tablet Take 1 tablet (500 mg total) by mouth 2 (two) times daily.   methocarbamol (ROBAXIN) 500 MG tablet Take 1 tablet (500 mg total) by mouth 3 (three) times daily as needed for muscle spasms.   metoprolol tartrate (LOPRESSOR) 25 MG tablet Take 0.5 tablets (12.5 mg total) by mouth 2 (two) times daily.   Multiple Vitamin (MULTIVITAMIN ADULT) TABS Take 1 tablet by mouth daily.   Multiple Vitamins-Minerals (EYE VITAMINS PO) Take 1 tablet by mouth daily.   omeprazole (PRILOSEC) 40 MG capsule Take 1 capsule (40 mg total) by mouth daily. Take 30 minutes prior to breakfast   ondansetron (ZOFRAN-ODT) 4 MG disintegrating tablet Take 1 tablet (4 mg total) by mouth every 8 (eight) hours as needed for nausea or vomiting.   QUEtiapine 150 MG TABS Take 150 mg by mouth at bedtime.   SUMAtriptan (IMITREX) 100 MG tablet TAKE 1 TABLET EARLIEST ONSET OF MIGRAINE. MAY REPEAT IN 2 HOURS IF HEADACHE PERSISTS OR RECURS. MAXIMUM 2 TABLETS IN 24 HOURS.   triamcinolone cream (KENALOG) 0.1 % Apply 1 Application topically 2 (two) times daily.   TRUEPLUS LANCETS 28G MISC Check blood sugars three time a day   zolpidem (AMBIEN) 5 MG tablet Take 1 tablet (5 mg total) by mouth at bedtime as needed for sleep.     Allergies:   Aspirin and Oxycodone   Social History    Socioeconomic History   Marital status: Significant Other    Spouse name: Not on file   Number of children: 3   Years of education: 14   Highest education level: Not on file  Occupational History   Not on file  Tobacco Use   Smoking status: Former    Packs/day: 0.25    Years: 8.00    Total pack years: 2.00    Types: Cigarettes   Smokeless tobacco: Never  Vaping Use   Vaping Use: Never used  Substance and Sexual Activity   Alcohol use: Not Currently    Comment: occasional   Drug use: No   Sexual activity: Yes    Birth control/protection: Surgical  Other Topics Concern  Not on file  Social History Narrative   Patient lives at home with mother and father , one story    Patient has 3 children    Patient is single   Patient has 14 years of education    Patient is right handed    Caffeine none   Social Determinants of Health   Financial Resource Strain: Not on file  Food Insecurity: Food Insecurity Present (05/24/2022)   Hunger Vital Sign    Worried About Running Out of Food in the Last Year: Sometimes true    Ran Out of Food in the Last Year: Sometimes true  Transportation Needs: No Transportation Needs (01/30/2021)   PRAPARE - Hydrologist (Medical): No    Lack of Transportation (Non-Medical): No  Physical Activity: Not on file  Stress: Not on file  Social Connections: Not on file     Family History: The patient's family history includes Colon cancer in her paternal grandmother; Colon cancer (age of onset: 60) in her father; Depression in her mother; Diabetes in her father and mother; Hypertension in her father and mother; Mental illness in her mother. There is no history of Esophageal cancer or Stomach cancer.    ROS:   Please see the history of present illness.    All other systems reviewed and are negative.   EKGs/Labs/Other Studies Reviewed:    The following studies were reviewed today:  Coronary CT 01/2022 FINDINGS: Scan was  triggered in the descending thoracic aorta. Axial non-contrast 3 mm slices were carried out through the heart. The data set was analyzed on a dedicated work station and scored using the [place]. Gantry rotation speed was 250 msecs and collimation was .6 mm. 0.8 mg of sl NTG was given. The 3D data set was reconstructed in 5% intervals of the 67-82 % of the R-R cycle. Diastolic phases were analyzed on a dedicated work station using MPR, MIP and VRT modes. The patient received 95 cc of contrast.   Aorta:  Normal size.  No calcifications.  No dissection.   Main Pulmonary Artery: Dilation of the main pulmonary artery, mild, at 31 mm.   Aortic Valve:  Tri-leaflet.  No calcifications.   Coronary Arteries:  Normal coronary origin.  Right dominance.   Coronary Calcium Score:   Left main: 0   Left anterior descending artery: 1   Left circumflex artery: 0   Right coronary artery: 0   Total: 1   Percentile: 83rd for age, sex, and race matched control.   RCA is a large dominant artery that gives rise to PDA and PLA. There is no significant plaque.   Left main is a large artery that gives rise to LAD and LCX arteries. There is no significant plaque.   LAD is a large vessel that gives rise to multiple diagonals. Minimal calcified plaque in the mid LAD.   LCX is a non-dominant artery that gives rise multiple OM branches. Mild soft plaque in the mid LCX.   Other findings:   Normal pulmonary vein drainage into the left atrium.   Normal left atrial appendage without a thrombus.   Extra-cardiac findings: See attached radiology report for non-cardiac structures.   IMPRESSION: 1. Coronary calcium score of 1. This was 83rd percentile for age, sex, and race matched control.   2. Normal coronary origin with right dominance.   3. Dilation of the main pulmonary artery, mild, at 31 mm. This can be associated with the presence of  pulmonary hypertension; clinical correlation  advised.   4. CAD-RADS 2. Mild non-obstructive CAD (25-49%). Consider non-atherosclerotic causes of chest pain. Consider preventive therapy and risk factor modification.  EKG:  EKG is not  ordered today.   Recent Labs: 10/09/2022: ALT 15; BUN 9; Creatinine, Ser 0.85; Hemoglobin 11.9; Platelets 343.0; Potassium 3.8; Sodium 139  Recent Lipid Panel    Component Value Date/Time   CHOL 177 11/30/2021 1039   TRIG 181 (H) 11/30/2021 1039   HDL 40 11/30/2021 1039   CHOLHDL 4.4 11/30/2021 1039   CHOLHDL 3.2 11/14/2014 0707   VLDL 19 11/14/2014 0707   LDLCALC 105 (H) 11/30/2021 1039   Physical Exam:    VS:  BP 126/84   Pulse 81   Ht 5' 3"  (1.6 m)   Wt 230 lb (104.3 kg)   SpO2 94%   BMI 40.74 kg/m     Wt Readings from Last 3 Encounters:  10/16/22 230 lb (104.3 kg)  10/09/22 232 lb (105.2 kg)  10/06/22 218 lb (98.9 kg)     GEN:  Well nourished, well developed in no acute distress HEENT: Normal NECK: No JVD; No carotid bruits LYMPHATICS: No lymphadenopathy CARDIAC: RRR, no murmurs, rubs, gallops RESPIRATORY:  Clear to auscultation without rales, wheezing or rhonchi  ABDOMEN: Soft, non-tender, non-distended MUSCULOSKELETAL:  No edema; No deformity  SKIN: Warm and dry NEUROLOGIC:  Alert and oriented x 3 PSYCHIATRIC:  Normal affect   ASSESSMENT AND PLAN:    Palpitation Improved but still occurring few times per week.  She is symptomatic with dizziness/lightheadedness.  Occasional shortness of breath with this.  Continue metoprolol at current dose.  Will get monitor.  2.  Diabetes mellitus -Per PCP  3.  Abdominal pain -Undergoing work-up per GI.  Scheduled for EGD tomorrow.  She will be cleared at acceptable risk.  4.  Dyspnea on exertion -Likely due to morbid obesity and deconditioning.  Recommended weight loss and exercise.  Coronary CTA reassuring.  Medication Adjustments/Labs and Tests Ordered: Current medicines are reviewed at length with the patient today.   Concerns regarding medicines are outlined above.  Orders Placed This Encounter  Procedures   LONG TERM MONITOR (3-14 DAYS)   No orders of the defined types were placed in this encounter.   Patient Instructions  Medication Instructions:  Your physician recommends that you continue on your current medications as directed. Please refer to the Current Medication list given to you today. *If you need a refill on your cardiac medications before your next appointment, please call your pharmacy*   Lab Work: None ordered   Testing/Procedures: ZIO XT- Long Term Monitor Instructions  Your physician has requested you wear a ZIO patch monitor for 14 days.  This is a single patch monitor. [employer] supplies one patch monitor per enrollment. Additional stickers are not available. Please do not apply patch if you will be having a Nuclear Stress Test,  Echocardiogram, Cardiac CT, MRI, or Chest Xray during the period you would be wearing the  monitor. The patch cannot be worn during these tests. You cannot remove and re-apply the  ZIO XT patch monitor.  Your ZIO patch monitor will be mailed 3 day USPS to your address on file. It may take 3-5 days  to receive your monitor after you have been enrolled.  Once you have received your monitor, please review the enclosed instructions. Your monitor  has already been registered assigning a specific monitor serial # to you.  Billing and Patient Assistance Program Information  We have supplied [employer] with any of your insurance information on file for billing purposes. [employer] offers a sliding scale Patient Assistance Program for patients that do not have  insurance, or whose insurance does not completely cover the cost of the ZIO monitor.  You must apply for the Patient Assistance Program to qualify for this discounted rate.  To apply, please call [employer] at [phone], select option 4, select option 2, ask to apply for  Patient Assistance Program.  Theodore Demark will ask your household income, and how many people  are in your household. They will quote your out-of-pocket cost based on that information.  [employer] will also be able to set up a 21-month interest-free payment plan if needed.  Applying the monitor   Shave hair from upper left chest.  Hold abrader disc by orange tab. Rub abrader in 40 strokes over the upper left chest as  indicated in your monitor instructions.  Clean area with 4 enclosed alcohol pads. Let dry.  Apply patch as indicated in monitor instructions. Patch will be placed under collarbone on left  side of chest with arrow pointing upward.  Rub patch adhesive wings for 2 minutes. Remove white label marked "1". Remove the white  label marked "2". Rub patch adhesive wings for 2 additional minutes.  While looking in a mirror, press and release button in center of patch. A small green light will  flash 3-4 times. This will be your only indicator that the monitor has been turned on.  Do not shower for the first 24 hours. You may shower after the first 24 hours.  Press the button if you feel a symptom. You will hear a small click. Record Date, Time and  Symptom in the Patient Logbook.  When you are ready to remove the patch, follow instructions on the last 2 pages of Patient  Logbook. Stick patch monitor onto the last page of Patient Logbook.  Place Patient Logbook in the blue and white box. Use locking tab on box and tape box closed  securely. The blue and white box has prepaid postage on it. Please place it in the mailbox as  soon as possible. Your physician should have your test results approximately 7 days after the  monitor has been [facility]  Call IWeakleyat [phone] you have questions regarding  your ZIO XT patch monitor. Call them immediately if you see an orange light blinking on your  monitor.  If your monitor falls off in less than 4 days, contact our Monitor  department at [phone]  If your monitor becomes loose or falls off after 4 days call [employer] at [phone]  suggestions on securing your monitor    Follow-Up: [facility] you and your health needs are our priority.  As part of our continuing mission to provide you with exceptional heart care, we have created designated Provider Care Teams.  These Care Teams include your primary Cardiologist (physician) and Advanced Practice Providers (APPs -  Physician Assistants and Nurse Practitioners) who all work together to provide you with the care you need, when you need it.  We recommend signing up for the patient portal called "MyChart".  Sign up information is provided on this After Visit Summary.  MyChart is used to connect with patients for Virtual Visits (Telemedicine).  Patients are able to view lab/test results, encounter notes, upcoming appointments, etc.  Non-urgent messages can be sent to your provider as well.   To  learn more about what you can do with MyChart, go to [email or web address].    Your next appointment:   6 month(s)  The format for your next appointment:   In Person  Provider:   Freada Bergeron, MD     Other Instructions   Important Information About Sugar         Signed, Leanor Kail, [place]  10/16/2022 2:20 PM    [place]

## 2022-10-16 NOTE — Progress Notes (Unsigned)
Enrolled for [employer] to mail a ZIO XT long term holter monitor to the patients address on file. [id number] fell off prematurely.   Replacement [id number] from office inventory applied in office.  Dr. Johney Frame to read.

## 2022-10-16 NOTE — Patient Instructions (Signed)
Medication Instructions:  Your physician recommends that you continue on your current medications as directed. Please refer to the Current Medication list given to you today. *If you need a refill on your cardiac medications before your next appointment, please call your pharmacy*   Lab Work: None ordered   Testing/Procedures: ZIO XT- Long Term Monitor Instructions  Your physician has requested you wear a ZIO patch monitor for 14 days.  This is a single patch monitor. [employer] supplies one patch monitor per enrollment. Additional stickers are not available. Please do not apply patch if you will be having a Nuclear Stress Test,  Echocardiogram, Cardiac CT, MRI, or Chest Xray during the period you would be wearing the  monitor. The patch cannot be worn during these tests. You cannot remove and re-apply the  ZIO XT patch monitor.  Your ZIO patch monitor will be mailed 3 day USPS to your address on file. It may take 3-5 days  to receive your monitor after you have been enrolled.  Once you have received your monitor, please review the enclosed instructions. Your monitor  has already been registered assigning a specific monitor serial # to you.  Billing and Patient Assistance Program Information  We have supplied [employer] with any of your insurance information on file for billing purposes. [employer] offers a sliding scale Patient Assistance Program for patients that do not have  insurance, or whose insurance does not completely cover the cost of the ZIO monitor.  You must apply for the Patient Assistance Program to qualify for this discounted rate.  To apply, please call [employer] at [phone], select option 4, select option 2, ask to apply for  Patient Assistance Program. Theodore Demark will ask your household income, and how many people  are in your household. They will quote your out-of-pocket cost based on that information.  [employer] will also be able to set up a 53-month interest-free payment  plan if needed.  Applying the monitor   Shave hair from upper left chest.  Hold abrader disc by orange tab. Rub abrader in 40 strokes over the upper left chest as  indicated in your monitor instructions.  Clean area with 4 enclosed alcohol pads. Let dry.  Apply patch as indicated in monitor instructions. Patch will be placed under collarbone on left  side of chest with arrow pointing upward.  Rub patch adhesive wings for 2 minutes. Remove white label marked "1". Remove the white  label marked "2". Rub patch adhesive wings for 2 additional minutes.  While looking in a mirror, press and release button in center of patch. A small green light will  flash 3-4 times. This will be your only indicator that the monitor has been turned on.  Do not shower for the first 24 hours. You may shower after the first 24 hours.  Press the button if you feel a symptom. You will hear a small click. Record Date, Time and  Symptom in the Patient Logbook.  When you are ready to remove the patch, follow instructions on the last 2 pages of Patient  Logbook. Stick patch monitor onto the last page of Patient Logbook.  Place Patient Logbook in the blue and white box. Use locking tab on box and tape box closed  securely. The blue and white box has prepaid postage on it. Please place it in the mailbox as  soon as possible. Your physician should have your test results approximately 7 days after the  monitor has been [facility]  Call [place] at [phone] if you have questions regarding  your ZIO XT patch monitor. Call them immediately if you see an orange light blinking on your  monitor.  If your monitor falls off in less than 4 days, contact our Monitor department at [phone].  If your monitor becomes loose or falls off after 4 days call [employer] at [phone] for  suggestions on securing your monitor    Follow-Up: At [facility], you and your health  needs are our priority.  As part of our continuing mission to provide you with exceptional heart care, we have created designated Provider Care Teams.  These Care Teams include your primary Cardiologist (physician) and Advanced Practice Providers (APPs -  Physician Assistants and Nurse Practitioners) who all work together to provide you with the care you need, when you need it.  We recommend signing up for the patient portal called "MyChart".  Sign up information is provided on this After Visit Summary.  MyChart is used to connect with patients for Virtual Visits (Telemedicine).  Patients are able to view lab/test results, encounter notes, upcoming appointments, etc.  Non-urgent messages can be sent to your provider as well.   To learn more about what you can do with MyChart, go to [email or web address].    Your next appointment:   6 month(s)  The format for your next appointment:   In Person  Provider:   Freada Bergeron, MD     Other Instructions   Important Information About Sugar

## 2022-10-17 ENCOUNTER — Ambulatory Visit (AMBULATORY_SURGERY_CENTER): Payer: Commercial Managed Care - HMO | Admitting: Internal Medicine

## 2022-10-17 ENCOUNTER — Encounter: Payer: Self-pay | Admitting: Internal Medicine

## 2022-10-17 ENCOUNTER — Other Ambulatory Visit: Payer: Self-pay

## 2022-10-17 VITALS — BP 116/76 | HR 86 | Temp 97.1°F | Resp 14 | Ht 63.0 in | Wt 232.0 lb

## 2022-10-17 DIAGNOSIS — R1010 Upper abdominal pain, unspecified: Secondary | ICD-10-CM

## 2022-10-17 DIAGNOSIS — R110 Nausea: Secondary | ICD-10-CM

## 2022-10-17 DIAGNOSIS — K253 Acute gastric ulcer without hemorrhage or perforation: Secondary | ICD-10-CM | POA: Diagnosis not present

## 2022-10-17 DIAGNOSIS — R7989 Other specified abnormal findings of blood chemistry: Secondary | ICD-10-CM

## 2022-10-17 DIAGNOSIS — K2970 Gastritis, unspecified, without bleeding: Secondary | ICD-10-CM

## 2022-10-17 MED ORDER — SODIUM CHLORIDE 0.9 % IV SOLN: 500.0000 mL | INTRAVENOUS | Status: DC

## 2022-10-17 NOTE — Patient Instructions (Signed)
Dr. Blanch Media nurse will contact you regarding scheduling the complete abdominal ultrasound.  YOU HAD AN ENDOSCOPIC PROCEDURE TODAY AT [facility]:   Refer to the procedure report that was given to you for any specific questions about what was found during the examination.  If the procedure report does not answer your questions, please call your gastroenterologist to clarify.  If you requested that your care partner not be given the details of your procedure findings, then the procedure report has been included in a sealed envelope for you to review at your convenience later.  YOU SHOULD EXPECT: Some feelings of bloating in the abdomen. Passage of more gas than usual.  Walking can help get rid of the air that was put into your GI tract during the procedure and reduce the bloating. If you had a lower endoscopy (such as a colonoscopy or flexible sigmoidoscopy) you may notice spotting of blood in your stool or on the toilet paper. If you underwent a bowel prep for your procedure, you may not have a normal bowel movement for a few days.  Please Note:  You might notice some irritation and congestion in your nose or some drainage.  This is from the oxygen used during your procedure.  There is no need for concern and it should clear up in a day or so.  SYMPTOMS TO REPORT IMMEDIATELY:    Following upper endoscopy (EGD)  Vomiting of blood or coffee ground material  New chest pain or pain under the shoulder blades  Painful or persistently difficult swallowing  New shortness of breath  Fever of 100F or higher  Black, tarry-looking stools  For urgent or emergent issues, a gastroenterologist can be reached at any hour by calling [phone]. Do not use MyChart messaging for urgent concerns.    DIET:  We do recommend a small meal at first, but then you may proceed to your regular diet.  Drink plenty of fluids but you should avoid alcoholic beverages for 24 hours.  ACTIVITY:  You should  plan to take it easy for the rest of today and you should NOT DRIVE or use heavy machinery until tomorrow (because of the sedation medicines used during the test).    FOLLOW UP: Our staff will call the number listed on your records the next business day following your procedure.  We will call around 7:15- 8:00 am to check on you and address any questions or concerns that you may have regarding the information given to you following your procedure. If we do not reach you, we will leave a message.     If any biopsies were taken you will be contacted by phone or by letter within the next 1-3 weeks.  Please call us at [phone] if you have not heard about the biopsies in 3 weeks.    SIGNATURES/CONFIDENTIALITY: You and/or your care partner have signed paperwork which will be entered into your electronic medical record.  These signatures attest to the fact that that the information above on your After Visit Summary has been reviewed and is understood.  Full responsibility of the confidentiality of this discharge information lies with you and/or your care-partner.

## 2022-10-17 NOTE — Progress Notes (Signed)
Dr. Blanch Media nurse was sent an emessage to schedule complete abdominal u/s per Dr. Blanch Media instructions per Boris Sharper

## 2022-10-17 NOTE — Progress Notes (Signed)
Report to PACU, RN, vss, BBS= Clear.  

## 2022-10-17 NOTE — Op Note (Signed)
[facility] Patient Name: Whitney Benton Procedure Date: 10/17/2022 10:25 AM MRN: [id number] Endoscopist: Docia Chuck. Henrene Pastor , MD, [id number] Age: 51 Referring MD:  Date of Birth: 05-Feb-1971 Gender: Female Account #: [id number] Procedure:                Upper GI endoscopy with biopsies Indications:              Epigastric abdominal pain, Nausea Medicines:                Monitored Anesthesia Care Procedure:                Pre-Anesthesia Assessment:                           - Prior to the procedure, a History and Physical                            was performed, and patient medications and                            allergies were reviewed. The patient's tolerance of                            previous anesthesia was also reviewed. The risks                            and benefits of the procedure and the sedation                            options and risks were discussed with the patient.                            All questions were answered, and informed consent                            was obtained. Prior Anticoagulants: The patient has                            taken no anticoagulant or antiplatelet agents. ASA                            Grade Assessment: II - A patient with mild systemic                            disease. After reviewing the risks and benefits,                            the patient was deemed in satisfactory condition to                            undergo the procedure.                           After obtaining informed consent, the endoscope was  passed under direct vision. Throughout the                            procedure, the patient's blood pressure, pulse, and                            oxygen saturations were monitored continuously. The                            Endoscope was introduced through the mouth, and                            advanced to the second part of duodenum. The upper                            GI  endoscopy was accomplished without difficulty.                            The patient tolerated the procedure well. Scope In: Scope Out: Findings:                 The esophagus was normal.                           The stomach revealed superficial erosions along the                            body consistent with NSAID induced pathology.                            Biopsies were taken with a cold forceps for                            histology, from the gastric antrum, to rule out H.                            pylori.                           The examined duodenum was normal.                           The cardia and gastric fundus were normal on                            retroflexion. Complications:            No immediate complications. Estimated Blood Loss:     Estimated blood loss: none. Impression:               1. Gastric erosions                           2. Otherwise normal EGD. Recommendation:           1. Patient has a contact number available for  emergencies. The signs and symptoms of potential                            delayed complications were discussed with the                            patient. Return to normal activities tomorrow.                            Written discharge instructions were provided to the                            patient.                           2. Resume previous diet.                           3. Continue present medications.                           4. Await pathology results.                           5. Schedule abdominal ultrasound "epigastric pain,                            mildly elevated liver tests and mildly elevated                            lipase evaluate " [name] N. Henrene Pastor, MD 10/17/2022 10:45:32 AM This report has been signed electronically.

## 2022-10-17 NOTE — Progress Notes (Signed)
  Chief Complaint:  upper abdominal pain    Assessment &  Plan   # 51 yo female with a two week history of intermittent, generalized upper abdominal pain and nausea on Mobic . Rule out PUD. Biliary disease less likely.  Obtain basic labs including CMP, CBC, lipase Schedule for EGD. The risks and benefits of EGD with possible biopsies were discussed with the patient who agrees to proceed.  Trial of Omeprazole 40 mg q am Advised judicious use of Mobic until workup of abdominal pain is complete. If EGD negative and symptoms persist consider abdominal imaging.    # History of mild elevation of alk phos ( dating back years). Alk phos 141 at last check in Dec 2022. Remainder of liver chemistries normal. No liver abnormalities on CTAP with contrast Aug 2021.  Will see what updated labs shows. normalized.   # Uncontrolled DM.   HPI   Patient is a 51-year-old female with a history of anxiety, asthma, headaches, depression, DM2, diabetes, possible glaucoma.  See PMH for additional history.  Patient known to Dr. Callaghan Laverdure.  She had a screening colonoscopy in July of this year.  She has a family history of colon cancer.   Interval History:  Whitney Benton is here with a about a two week history of intermittent generalized upper abdominal pain. Pain characterized as a non-radiating, dull ache located in epigastrium / mid abdomen. The pain often gets worse if she eats but it also wakes her up at night and can get worse if she bends over.   She has associated nausea without vomiting She describes early satiety but denies associated weight loss. She hasn't tried anything to relieve the pain.  She takes several routine medications. Voltaren on home med list but hasn't taken it ( she didn't even know what it was for).  She has been taking daily Mobic for foot pain since the end of August. On Trulicity since 2021   Bowel habits are at baseline. No blood in stool .  Previous GI Evaluation   July 2023 screening  colonoscopy.  Family history colon cancer. -1 mm colon polyp removed -Follow-up in 5 years given family history colon cancer COLONIC MUCOSA WITH NO FEATURES DIAGNOSTIC FOR A SPECIFIC TYPE OF MUCOSAL POLYP (MICROSCOPICALLY EXAMINED AT 3 ADDITIONAL H&E LEVELS  Imaging   Labs:     Latest Ref Rng & Units 11/30/2021   10:39 AM 01/25/2021    3:34 AM 01/23/2021   12:43 PM  CBC  WBC 3.4 - 10.8 x10E3/uL 6.1  9.9    Hemoglobin 11.1 - 15.9 g/dL 13.5  12.5  18.4   Hematocrit 34.0 - 46.6 % 40.9  36.2  54.0   Platelets 150 - 450 x10E3/uL 372  303         Latest Ref Rng & Units 11/30/2021   10:39 AM 08/10/2020    9:00 PM 10/03/2019    6:33 AM  Hepatic Function  Total Protein 6.0 - 8.5 g/dL 7.0  8.6  6.7   Albumin 3.8 - 4.9 g/dL 4.4  4.6  3.7   AST 0 - 40 IU/L 20  31  9   ALT 0 - 32 IU/L 16  17  11   Alk Phosphatase 44 - 121 IU/L 141  163  84   Total Bilirubin 0.0 - 1.2 mg/dL 0.3  0.4  1.3   Bilirubin, Direct 0.00 - 0.40 mg/dL <0.10   0.1     Past Medical History:  Diagnosis Date     Anxiety    Asthma    Depression    Diabetes mellitus without complication (HCC)    Glaucoma suspect    Trichomonas infection     Past Surgical History:  Procedure Laterality Date   CESAREAN SECTION     UPPER GASTROINTESTINAL ENDOSCOPY     VAGINAL HYSTERECTOMY  12/23/2004   Fibroids, menorrhagia, benign pathology    Current Medications, Allergies, Family History and Social History were reviewed in [facility] electronic medical record.     Current Outpatient Medications  Medication Sig Dispense Refill   acetaminophen (TYLENOL) 500 MG tablet Take 1 tablet (500 mg total) by mouth every 6 (six) hours as needed. 30 tablet 0   albuterol (VENTOLIN HFA) 108 (90 Base) MCG/ACT inhaler Inhale 2 puffs into the lungs every 6 (six) hours as needed for wheezing or shortness of breath (Cough). 18 g 0   atorvastatin (LIPITOR) 40 MG tablet Take 1 tablet (40 mg total) by mouth daily. 90 tablet 2   Blood Glucose  Monitoring Suppl (TRUE METRIX METER) w/Device KIT Check blood sugars three times a day 1 kit 0   busPIRone (BUSPAR) 15 MG tablet Take 1 tablet (15 mg total) by mouth 3 (three) times daily. 90 tablet 3   conjugated estrogens (PREMARIN) vaginal cream Apply 0.5 gram intravaginally 2 times a week 42.5 g 1   Continuous Blood Gluc Receiver (FREESTYLE LIBRE 2 READER) DEVI Use to check blood sugar three times daily. 1 each 0   Continuous Blood Gluc Sensor (FREESTYLE LIBRE 2 SENSOR) MISC Use to check blood sugar three times daily. 2 each 3   Continuous Blood Gluc Sensor (FREESTYLE LIBRE 2 SENSOR) MISC take as directed 1 each 0   cycloSPORINE (RESTASIS) 0.05 % ophthalmic emulsion Apply 1 drop into both eyes twice a day 180 each 3   diclofenac (VOLTAREN) 75 MG EC tablet Take 1 tablet (75 mg total) by mouth 2 (two) times daily. 30 tablet 2   Dulaglutide (TRULICITY) 4.5 MG/0.5ML SOPN Inject 4.5 mg as directed once a week. 2 mL 3   DULoxetine (CYMBALTA) 20 MG capsule Take 1 capsule (20 mg total) by mouth daily. 30 capsule 3   DULoxetine (CYMBALTA) 60 MG capsule Take 1 capsule (60 mg total) by mouth daily. 30 capsule 3   Erenumab-aooe (AIMOVIG) 140 MG/ML SOAJ Inject 140 mg into the skin every 28 (twenty-eight) days. 1.12 mL 5   fexofenadine (ALLEGRA) 180 MG tablet Take 1 tablet (180 mg total) by mouth daily. 90 tablet 1   fluorometholone (FML) 0.1 % ophthalmic suspension Instill 1 drop into both eyes as directed 1 drop 4 times day for 2 weeks then 1 drop 2 times day for 2 weeks 10 mL 0   fluticasone (FLONASE) 50 MCG/ACT nasal spray Place 1 spray into both nostrils daily. Begin by using 2 sprays in each nare daily for 3 to 5 days, then decrease to 1 spray in each nare daily. 32 mL 1   glucose blood (TRUE METRIX BLOOD GLUCOSE TEST) test strip Use to check blood sugar 3 times daily 100 each 2   hydrOXYzine (ATARAX) 25 MG tablet TAKE 1 TABLET (25 MG TOTAL) BY MOUTH 3 (THREE) TIMES DAILY AS NEEDED. 90 tablet 3    insulin aspart (NOVOLOG FLEXPEN) 100 UNIT/ML FlexPen INJECT 20 UNITS BEFORE BREAKFAST, 20 UNITS BEFORE LUNCH, AND 20 UNITS BEFORE DINNER. 15 mL 2   Insulin Glargine (BASAGLAR KWIKPEN) 100 UNIT/ML Inject 62 Units into the skin at bedtime. 21 mL 2     Insulin Pen Needle (PEN NEEDLES) 31G X 6 MM MISC Use as directed 100 each 0   lidocaine (LIDODERM) 5 % Place 1 patch onto the skin daily. Remove & Discard patch within 12 hours or as directed by MD 30 patch 0   meloxicam (MOBIC) 15 MG tablet Take 1 tablet (15 mg total) by mouth daily. 30 tablet 2   metFORMIN (GLUCOPHAGE-XR) 500 MG 24 hr tablet Take 1 tablet (500 mg total) by mouth 2 (two) times daily. 180 tablet 1   methocarbamol (ROBAXIN) 500 MG tablet Take 1 tablet (500 mg total) by mouth 3 (three) times daily as needed for muscle spasms. 20 tablet 2   metoprolol tartrate (LOPRESSOR) 25 MG tablet Take 0.5 tablets (12.5 mg total) by mouth 2 (two) times daily. 90 tablet 2   Multiple Vitamin (MULTIVITAMIN ADULT) TABS Take 1 tablet by mouth daily.     Multiple Vitamins-Minerals (EYE VITAMINS PO) Take by mouth.     ondansetron (ZOFRAN-ODT) 4 MG disintegrating tablet Take 1 tablet (4 mg total) by mouth every 8 (eight) hours as needed for nausea or vomiting. 20 tablet 0   predniSONE (STERAPRED UNI-PAK 21 TAB) 5 MG (21) TBPK tablet Take as directed 21 tablet 0   QUEtiapine 150 MG TABS Take 150 mg by mouth at bedtime. 30 tablet 3   SUMAtriptan (IMITREX) 100 MG tablet TAKE 1 TABLET EARLIEST ONSET OF MIGRAINE. MAY REPEAT IN 2 HOURS IF HEADACHE PERSISTS OR RECURS. MAXIMUM 2 TABLETS IN 24 HOURS. 10 tablet 5   triamcinolone cream (KENALOG) 0.1 % Apply 1 Application topically 2 (two) times daily. 30 g 0   TRUEPLUS LANCETS 28G MISC Check blood sugars three time a day 100 each 12   zolpidem (AMBIEN) 5 MG tablet Take 1 tablet (5 mg total) by mouth at bedtime as needed for sleep. 30 tablet 1   No current facility-administered medications for this visit.    Review of  Systems: No chest pain. No shortness of breath. No urinary complaints.    Physical Exam  Wt Readings from Last 3 Encounters:  10/06/22 218 lb (98.9 kg)  09/10/22 231 lb (104.8 kg)  08/12/22 222 lb 3.2 oz (100.8 kg)    BP 130/82   Pulse 88   Ht 5' 3" (1.6 m)   Wt 232 lb (105.2 kg)   SpO2 96%   BMI 41.10 kg/m  Constitutional:  Generally well appearing female in no acute distress. Psychiatric: Pleasant. Normal mood and affect. Behavior is normal. EENT: Pupils normal.  Conjunctivae are normal. No scleral icterus. Neck supple.  Cardiovascular: Normal rate, regular rhythm. No edema Pulmonary/chest: Effort normal and breath sounds normal. No wheezing, rales or rhonchi. Abdominal: Soft, nondistended, nontender. Bowel sounds active throughout. There are no masses palpable. No hepatomegaly. Neurological: Alert and oriented to person place and time. Skin: Skin is warm and dry. No rashes noted.  Paula Guenther, NP  10/09/2022, 8:38 AM          

## 2022-10-17 NOTE — Progress Notes (Signed)
Called to room to assist during endoscopic procedure.  Patient ID and intended procedure confirmed with present staff. Received instructions for my participation in the procedure from the performing physician.  

## 2022-10-18 ENCOUNTER — Telehealth: Payer: Self-pay

## 2022-10-18 NOTE — Telephone Encounter (Signed)
  Follow up Call-     10/17/2022   10:00 AM 07/09/2022    8:19 AM  Call back number  Post procedure Call Back phone  # [phone]  Permission to leave phone message Yes Yes     Patient questions:  Do you have a fever, pain , or abdominal swelling? No. Pain Score  0 *  Have you tolerated food without any problems? Yes.    Have you been able to return to your normal activities? Yes.    Do you have any questions about your discharge instructions: Diet   No. Medications  No. Follow up visit  No.  Do you have questions or concerns about your Care? No.  Actions: * If pain score is 4 or above: No action needed, pain <4.

## 2022-10-21 ENCOUNTER — Encounter: Payer: Self-pay | Admitting: Internal Medicine

## 2022-10-21 ENCOUNTER — Ambulatory Visit: Payer: Commercial Managed Care - HMO | Attending: Internal Medicine | Admitting: Pharmacist

## 2022-10-21 ENCOUNTER — Encounter: Payer: Self-pay | Admitting: Pharmacist

## 2022-10-21 DIAGNOSIS — Z794 Long term (current) use of insulin: Secondary | ICD-10-CM

## 2022-10-21 DIAGNOSIS — E1165 Type 2 diabetes mellitus with hyperglycemia: Secondary | ICD-10-CM

## 2022-10-21 NOTE — Progress Notes (Signed)
S:    No chief complaint on file.  Whitney Benton is a 51 y.o. female who presents for diabetes evaluation, education, and management. PMH is significant for depression, migraines (Dr. Tomi Likens), mixed incontinence, DM, obesity, HL, vit D def, former smoker. Patient was referred and last seen by Primary Care Provider on . Last seen by CPP on 07/23/2022.  At last visit, occasional hypoglycemia was observed on CGM so Basaglar was reduced from 67 to 62 units daily and Novolog was reduced from 25 to 20 units TID.   Today, patient arrives in good spirits and presents without any assistance. Reports she has not taken insulin or checked her blood sugar since Friday due to not feeling good. Last BG on Thursday was 215 mg/dL. Reports she still does not have the finances to purchase CGM, but is hopeful she will next month. Saw GI on 10/09/2022 and had upper endoscopy on 10/17/2022 that found superficial erosions consistent with NSAID induced pathology. Per GI, pt had mildly elevated lipase. Now scheduled for abdominal US next week.   Current diabetes medications include: basaglar 62 units daily, Novolog 20 units TID, metformin 299 mg BID, Trulicity 4.5 mg weekly (Thursday)  Patient reports adherence to taking all medications as prescribed.   Insurance coverage: [employer]  Patient denies hypoglycemic events. Improved since reducing insulin  Reported home fasting blood sugars: low 100-215 Reported 2 hour post-meal/random blood sugars: 200-250s.  Patient denies nocturia (nighttime urination).  Patient denies neuropathy (nerve pain). Patient denies visual changes. Patient denies self foot exams.   Patient-reported exercise habits:   O:   Lab Results  Component Value Date   HGBA1C 10.2 (H) 07/25/2022   There were no vitals filed for this visit.  Lipid Panel     Component Value Date/Time   CHOL 177 11/30/2021 1039   TRIG 181 (H) 11/30/2021 1039   HDL 40 11/30/2021 1039   CHOLHDL 4.4 11/30/2021  1039   CHOLHDL 3.2 11/14/2014 0707   VLDL 19 11/14/2014 0707   LDLCALC 105 (H) 11/30/2021 1039    Clinical Atherosclerotic Cardiovascular Disease (ASCVD): No  The 10-year ASCVD risk score (Arnett DK, et al., 2019) is: 10%   Values used to calculate the score:     Age: 43 years     Sex: Female     Is Non-Hispanic African American: Yes     Diabetic: Yes     Tobacco smoker: Yes     Systolic Blood Pressure: 371 mmHg     Is BP treated: No     HDL Cholesterol: 40 mg/dL     Total Cholesterol: 177 mg/dL   A/P: Diabetes longstanding currently improved since last visit based on home readings. Patient is able to verbalize appropriate hypoglycemia management plan. Medication adherence appears appropriate. Hypoglycemia observed on CGM appears to correct itself quickly.  -Continued basaglar (insulin glargine) 62 units daily. -Continued Novolog (insulin aspart) 20 units TID. Instructed patient to hold while sick if she is not eating.  -Continued GLP-1 Trulicity (generic dulaglutide) to 4.5 mg weekly. Patient has been on since 2021. New onset intermittent generalized upper abdominal pain. She has an abdominal ultrasound on 10/25/2022. She can continue Trulicity for now but may need to stop based on GI findings.  -Continued metformin 500 mg BID.  -Extensively discussed pathophysiology of diabetes, recommended lifestyle interventions, dietary effects on blood sugar control.  -Counseled on s/sx of and management of hypoglycemia.  -Next A1c anticipated November 2023.   Total time in face  to face counseling 25 minutes.    Follow-up:  Pharmacist 1 month. PCP clinic visit in December 2023.   Joseph Art, Pharm.D. PGY-2 Ambulatory Care Pharmacy Resident 10/21/2022 9:00 AM

## 2022-10-22 ENCOUNTER — Other Ambulatory Visit: Payer: Self-pay

## 2022-10-23 ENCOUNTER — Other Ambulatory Visit: Payer: Self-pay

## 2022-10-25 ENCOUNTER — Ambulatory Visit (HOSPITAL_COMMUNITY)
Admission: RE | Admit: 2022-10-25 | Discharge: 2022-10-25 | Disposition: A | Discharge status: Home / Self Care | Payer: Commercial Managed Care - HMO | Source: Ambulatory Visit | Attending: Nurse Practitioner | Admitting: Nurse Practitioner

## 2022-10-25 DIAGNOSIS — R1010 Upper abdominal pain, unspecified: Secondary | ICD-10-CM | POA: Diagnosis present

## 2022-10-25 DIAGNOSIS — R110 Nausea: Secondary | ICD-10-CM | POA: Diagnosis present

## 2022-10-28 ENCOUNTER — Ambulatory Visit (HOSPITAL_COMMUNITY): Admission: RE | Admit: 2022-10-28 | Payer: Commercial Managed Care - HMO | Source: Ambulatory Visit

## 2022-10-28 ENCOUNTER — Other Ambulatory Visit: Payer: Self-pay

## 2022-10-28 DIAGNOSIS — R002 Palpitations: Secondary | ICD-10-CM | POA: Diagnosis not present

## 2022-10-28 DIAGNOSIS — E782 Mixed hyperlipidemia: Secondary | ICD-10-CM | POA: Diagnosis not present

## 2022-10-28 DIAGNOSIS — R000 Tachycardia, unspecified: Secondary | ICD-10-CM

## 2022-10-28 DIAGNOSIS — E1165 Type 2 diabetes mellitus with hyperglycemia: Secondary | ICD-10-CM | POA: Diagnosis not present

## 2022-10-28 DIAGNOSIS — Z794 Long term (current) use of insulin: Secondary | ICD-10-CM

## 2022-10-29 ENCOUNTER — Other Ambulatory Visit: Payer: Self-pay

## 2022-10-29 ENCOUNTER — Encounter: Payer: Self-pay | Admitting: *Deleted

## 2022-10-29 ENCOUNTER — Other Ambulatory Visit: Payer: Self-pay | Admitting: Internal Medicine

## 2022-10-29 DIAGNOSIS — N951 Menopausal and female climacteric states: Secondary | ICD-10-CM

## 2022-10-29 MED ORDER — PREMARIN 0.625 MG/GM VA CREA
TOPICAL_CREAM | VAGINAL | 1 refills | Status: DC
  Filled 2022-10-29: qty 30, 30d supply, fill #0

## 2022-10-29 NOTE — Telephone Encounter (Signed)
Requested medication (s) are due for refill today - expired Rx  Requested medication (s) are on the active medication list -yes  Future visit scheduled -yes  Last refill: 04/30/21 42.5g 1RF  Notes to clinic: expired Rx  Requested Prescriptions  Pending Prescriptions Disp Refills   conjugated estrogens (PREMARIN) vaginal cream 42.5 g 1    Sig: Apply 0.5 gram intravaginally 2 times a week     OB/GYN:  Estrogens Passed - 10/29/2022 12:14 PM      Passed - Mammogram is up-to-date per Health Maintenance      Passed - Last BP in normal range    BP Readings from Last 1 Encounters:  10/17/22 116/76         Passed - Valid encounter within last 12 months    Recent Outpatient Visits           1 week ago Type 2 diabetes mellitus with hyperglycemia, with long-term current use of insulin ([place])   [place], Annie Main L, RPH-CPP   1 month ago Type 2 diabetes mellitus with hyperglycemia, with long-term current use of insulin [facility])   [place], Annie Main L, RPH-CPP   2 months ago Type 2 diabetes mellitus with obesity ([place])   [place], Neoma Laming B, MD   3 months ago Type 2 diabetes mellitus with hyperglycemia, with long-term current use of insulin [facility])   [place], Annie Main L, RPH-CPP   3 months ago Type 2 diabetes mellitus with hyperglycemia, with long-term current use of insulin [facility])   [place], RPH-CPP       Future Appointments             In 4 weeks Daisy Blossom, Jarome Matin, [place]   In 1 month Wynetta Emery, Dalbert Batman, MD [place]               Requested Prescriptions  Pending Prescriptions Disp Refills   conjugated estrogens (PREMARIN) vaginal cream 42.5 g 1    Sig: Apply 0.5 gram  intravaginally 2 times a week     OB/GYN:  Estrogens Passed - 10/29/2022 12:14 PM      Passed - Mammogram is up-to-date per Health Maintenance      Passed - Last BP in normal range    BP Readings from Last 1 Encounters:  10/17/22 116/76         Passed - Valid encounter within last 12 months    Recent Outpatient Visits           1 week ago Type 2 diabetes mellitus with hyperglycemia, with long-term current use of insulin [facility])   [place], Annie Main L, RPH-CPP   1 month ago Type 2 diabetes mellitus with hyperglycemia, with long-term current use of insulin [facility])   [place], Annie Main L, RPH-CPP   2 months ago Type 2 diabetes mellitus with obesity [facility])   [place] Karle Plumber B, MD   3 months ago Type 2 diabetes mellitus with hyperglycemia, with long-term current use of insulin [facility])   [place], Annie Main L, RPH-CPP   3 months ago Type 2 diabetes mellitus with hyperglycemia, with long-term current use  of insulin [facility])   [place], RPH-CPP       Future Appointments             In 4 weeks Daisy Blossom, Jarome Matin, [place]   In 1 month Wynetta Emery, Dalbert Batman, MD [place]

## 2022-10-29 NOTE — Progress Notes (Signed)
Patient ID: Whitney Benton, female   DOB: 10-30-1971, 51 y.o.   MRN: [id number] ZIO XT Serial # [id number] fell off prematurely and mailed back. [employer] notified to cancel charges on first monitor. Replacement monitor serial # [id number] from office inventory applied to patient using tincture on benzoin.

## 2022-10-30 ENCOUNTER — Other Ambulatory Visit: Payer: Self-pay

## 2022-10-31 ENCOUNTER — Other Ambulatory Visit (INDEPENDENT_AMBULATORY_CARE_PROVIDER_SITE_OTHER): Payer: Commercial Managed Care - HMO

## 2022-10-31 ENCOUNTER — Telehealth: Payer: Self-pay

## 2022-10-31 ENCOUNTER — Other Ambulatory Visit: Payer: Self-pay

## 2022-10-31 DIAGNOSIS — R1010 Upper abdominal pain, unspecified: Secondary | ICD-10-CM

## 2022-10-31 DIAGNOSIS — R7989 Other specified abnormal findings of blood chemistry: Secondary | ICD-10-CM

## 2022-10-31 LAB — HEPATIC FUNCTION PANEL
ALT: 17 U/L (ref 0–35)
AST: 16 U/L (ref 0–37)
Albumin: 4.4 g/dL (ref 3.5–5.2)
Alkaline Phosphatase: 164 U/L — ABNORMAL HIGH (ref 39–117)
Bilirubin, Direct: 0.1 mg/dL (ref 0.0–0.3)
Total Bilirubin: 0.4 mg/dL (ref 0.2–1.2)
Total Protein: 7.5 g/dL (ref 6.0–8.3)

## 2022-10-31 LAB — LIPASE: Lipase: 188 U/L — ABNORMAL HIGH (ref 11.0–59.0)

## 2022-10-31 NOTE — Telephone Encounter (Signed)
Spoke with pt and gave her Paula's message. Pt stated that her upper abdominal pain is not as bad. Pt rated her pain as a 7/10 and pain comes and goes. Lab orders placed for lipase and hepatic function panel and let pt know about repeat lab work. Pt verbalized understanding.

## 2022-10-31 NOTE — Telephone Encounter (Signed)
Left message for pt to call back  °

## 2022-10-31 NOTE — Telephone Encounter (Signed)
-----   Message from Willia Craze, NP sent at 10/30/2022  9:04 AM EST ----- Mickel Baas,  Please let her know I saw the EGD report ( some erosions in stomach). Seems unlikely to have caused so much pain. Her liver tests and lipase were slightly elevated. Will you ask her to come by for a repeat of both and is she still having the upper abdominal pain?   Thanks,  pg

## 2022-10-31 NOTE — Telephone Encounter (Signed)
Patient is returning your call.  

## 2022-11-01 ENCOUNTER — Other Ambulatory Visit: Payer: Self-pay

## 2022-11-04 ENCOUNTER — Other Ambulatory Visit: Payer: Self-pay

## 2022-11-04 ENCOUNTER — Ambulatory Visit: Payer: Commercial Managed Care - HMO | Admitting: Registered"

## 2022-11-05 ENCOUNTER — Other Ambulatory Visit: Payer: Self-pay

## 2022-11-06 ENCOUNTER — Other Ambulatory Visit: Payer: Self-pay

## 2022-11-07 ENCOUNTER — Other Ambulatory Visit: Payer: Self-pay | Admitting: Pharmacist

## 2022-11-07 ENCOUNTER — Other Ambulatory Visit: Payer: Self-pay | Admitting: Internal Medicine

## 2022-11-07 ENCOUNTER — Other Ambulatory Visit: Payer: Self-pay

## 2022-11-07 MED ORDER — ESTRADIOL 0.1 MG/GM VA CREA
1.0000 | TOPICAL_CREAM | Freq: Every day | VAGINAL | 2 refills | Status: DC
  Filled 2022-11-07: qty 42.5, 7d supply, fill #0

## 2022-11-07 MED ORDER — ESTRADIOL 0.1 MG/GM VA CREA
TOPICAL_CREAM | VAGINAL | 2 refills | Status: DC
  Filled 2022-11-07: qty 42.5, 14d supply, fill #0
  Filled 2022-11-20: qty 42.5, 7d supply, fill #0
  Filled 2022-11-22 (×2): qty 42.5, 30d supply, fill #0

## 2022-11-08 ENCOUNTER — Other Ambulatory Visit: Payer: Self-pay

## 2022-11-11 ENCOUNTER — Other Ambulatory Visit: Payer: Self-pay

## 2022-11-12 ENCOUNTER — Other Ambulatory Visit: Payer: Self-pay

## 2022-11-13 ENCOUNTER — Other Ambulatory Visit: Payer: Self-pay

## 2022-11-18 ENCOUNTER — Other Ambulatory Visit: Payer: Self-pay

## 2022-11-18 ENCOUNTER — Telehealth (HOSPITAL_COMMUNITY): Payer: Self-pay | Admitting: *Deleted

## 2022-11-18 NOTE — Telephone Encounter (Signed)
PATIENT ARRIVED WANTED TO SEE YOU SHE' HAS QUESTIONS CONCERNING THE  zolpidem (AMBIEN) 5 MG tablet  & WANTED TO SPWEK WITH YOU ABOUT THE MEDICATION-- PLEAS CALL

## 2022-11-19 ENCOUNTER — Other Ambulatory Visit (HOSPITAL_COMMUNITY): Payer: Self-pay | Admitting: Psychiatry

## 2022-11-19 MED ORDER — DOXEPIN HCL 25 MG PO CAPS: 25.0000 mg | ORAL_CAPSULE | Freq: Every day | ORAL | 3 refills | Status: DC

## 2022-11-19 NOTE — Telephone Encounter (Signed)
Patient notes that her sleep continues to be poor. She reports that her Seroquel is ineffective. Today she is agreeable to start doxepin 25 mg to help manage sleep. She will continue all other medications as prescribed.

## 2022-11-20 ENCOUNTER — Other Ambulatory Visit: Payer: Self-pay

## 2022-11-22 ENCOUNTER — Other Ambulatory Visit: Payer: Self-pay

## 2022-11-24 ENCOUNTER — Telehealth: Payer: Self-pay | Admitting: Internal Medicine

## 2022-11-24 MED ORDER — ESTRADIOL 10 MCG VA TABS: ORAL_TABLET | VAGINAL | 4 refills | Status: DC

## 2022-11-24 NOTE — Telephone Encounter (Signed)
-----   Message from Rickey Barbara, [place] sent at 11/13/2022 11:01 AM EST ----- After running test claims, it looks like Vagifem 92mg is what is covered on this patient's ins. Can her vaginal cream be changed?

## 2022-11-25 ENCOUNTER — Other Ambulatory Visit: Payer: Self-pay

## 2022-11-26 ENCOUNTER — Ambulatory Visit: Payer: Commercial Managed Care - HMO | Admitting: Pharmacist

## 2022-11-28 ENCOUNTER — Ambulatory Visit: Payer: Commercial Managed Care - HMO | Admitting: Pharmacist

## 2022-11-28 ENCOUNTER — Ambulatory Visit (INDEPENDENT_AMBULATORY_CARE_PROVIDER_SITE_OTHER): Payer: Commercial Managed Care - HMO | Admitting: Podiatry

## 2022-11-28 DIAGNOSIS — M7750 Other enthesopathy of unspecified foot: Secondary | ICD-10-CM | POA: Diagnosis not present

## 2022-11-28 DIAGNOSIS — M722 Plantar fascial fibromatosis: Secondary | ICD-10-CM

## 2022-11-28 NOTE — Progress Notes (Signed)
  Subjective:  Patient ID: Whitney Benton, female    DOB: 31-Oct-1971,  MRN: [id number]  Chief Complaint  Patient presents with   Follow-up    Follow up Bilateral plantar fasciitis. Patient is not longer taking meloxicam.     51 y.o. female presents for follow up on bilateral PF. Pain worse in morning with first steps out of bed. Pain level varies depending on activity level. Wears flip flops. Has tried stretching icing and inserts with no improvement. Not taking meloxicam as it gave her stomach upset.    Review of Systems: Negative except as noted in the HPI. Denies N/V/F/Ch.   Objective:  There were no vitals filed for this visit. There is no height or weight on file to calculate BMI. Constitutional Well developed. Well nourished.  Vascular Dorsalis pedis pulses palpable bilaterally. Posterior tibial pulses palpable bilaterally. Capillary refill normal to all digits.  No cyanosis or clubbing noted. Pedal hair growth normal.  Neurologic Normal speech. Oriented to person, place, and time. Epicritic sensation to light touch grossly present bilaterally.  Dermatologic Nails well groomed and normal in appearance. No open wounds. No skin lesions.  Orthopedic: Normal joint ROM without pain or crepitus bilaterally. No visible deformities. Tender to palpation at the calcaneal tuber bilaterally. No pain with calcaneal squeeze bilaterally. Ankle ROM diminished range of motion bilaterally. Silfverskiold Test: deferred bilaterally.   Radiographs: Taken and reviewed. No acute fractures or dislocations. No evidence of stress fracture.  Plantar heel spur absent. Posterior heel spur absent.   Assessment:   1. Plantar fasciitis, bilateral   2. Tendonitis of ankle or foot      Plan:  Patient was evaluated and treated and all questions answered.  Plantar Fasciitis, bilaterally, not much improved with inserts - Recommend course of PT for pt, [facility]  course - Educated on icing and stretching. Instructions given.  - DME: Recommend night splint, dispensed today. Recommend continuing powerstep inserts - Pharmacologic management: Hold meloxicam as causing stomach upset -Defer further injection at this time.   Return in about 8 weeks (around 01/23/2023) for Follow up bilateral PF.

## 2022-12-02 ENCOUNTER — Telehealth: Payer: Self-pay | Admitting: Cardiology

## 2022-12-02 NOTE — Telephone Encounter (Signed)
-----   Message from Freada Bergeron, MD sent at 11/30/2022  7:43 PM EST ----- Her heart monitor shows rare extra beats from the top chamber and the bottom chamber of the heart which is normal. There were no sustained arrhythmias. This is overall reassuring. How is she feeling? Does she wish to increase her metoprolol to '25mg'$  BID to help with symptoms?

## 2022-12-02 NOTE — Telephone Encounter (Signed)
Pt is returning call in regards to results. Transferred to [place], LPN.  

## 2022-12-02 NOTE — Telephone Encounter (Signed)
  Nuala Alpha, LPN 50/56/9794 80:16 AM EST Back to Top    The patient has been notified of the result and verbalized understanding.  All questions (if any) were answered.   Pt states she is asymptomatic at this time and will keep [place] posted as needed, if her palpitations reoccur or worsen, so that we can increase her beta blocker at that time.   Pt aware that I will make Dr. Johney Frame aware of symptom improvement, and that she will keep [place] posted as needed.   Pt verbalized understanding and agrees with this plan.

## 2022-12-03 ENCOUNTER — Ambulatory Visit: Payer: Self-pay | Admitting: Surgery

## 2022-12-03 DIAGNOSIS — K851 Biliary acute pancreatitis without necrosis or infection: Secondary | ICD-10-CM | POA: Insufficient documentation

## 2022-12-03 DIAGNOSIS — K8010 Calculus of gallbladder with chronic cholecystitis without obstruction: Secondary | ICD-10-CM | POA: Insufficient documentation

## 2022-12-05 ENCOUNTER — Other Ambulatory Visit: Payer: Self-pay

## 2022-12-08 ENCOUNTER — Other Ambulatory Visit (HOSPITAL_COMMUNITY): Payer: Self-pay | Admitting: Psychiatry

## 2022-12-08 DIAGNOSIS — F1994 Other psychoactive substance use, unspecified with psychoactive substance-induced mood disorder: Secondary | ICD-10-CM

## 2022-12-08 DIAGNOSIS — F411 Generalized anxiety disorder: Secondary | ICD-10-CM

## 2022-12-09 ENCOUNTER — Telehealth: Payer: Self-pay | Admitting: Orthopaedic Surgery

## 2022-12-09 ENCOUNTER — Other Ambulatory Visit: Payer: Self-pay

## 2022-12-09 ENCOUNTER — Ambulatory Visit (INDEPENDENT_AMBULATORY_CARE_PROVIDER_SITE_OTHER): Payer: Commercial Managed Care - HMO | Admitting: Clinical

## 2022-12-09 DIAGNOSIS — F331 Major depressive disorder, recurrent, moderate: Secondary | ICD-10-CM

## 2022-12-09 NOTE — Progress Notes (Signed)
   THERAPIST PROGRESS NOTE  Session Time: 45 minutes  Participation Level: Active  Behavioral Response: CasualAlertIrritable  Type of Therapy: Individual Therapy  Treatment Goals addressed: client will complete at least 80% of assigned homework  ProgressTowards Goals: Progressing  Interventions: CBT and Supportive  Summary:  Whitney Benton is a 52 y.o. female who presents fr the scheduled appointment oriented times five, appropriately dressed and friendly. Client denied hallucinations and delusions. Client reported she has been stressed. Client reported today is the day that her mother passed and she was not looking forward to this day coming.  Client reported it has been hard for her to cope with her symptoms of grief as she has had ongoing family conflict.  Client reported that his sister who has been staying in the home with her continues to neglect responsibilities for doing her part with paying bills and help keeping mom the house kept. Client reported today is the last day her gas bill for the house has to get paid because her sister did not tell her she was unable to pay. Client reported she is going to pay the bill and keep all bills in her name. Client reported she has also been conflict with her youngest son and his girlfriend. Client reported they will be having a second child July 2024. Client reported she has to have surgery on her gallbladder and both of her hands.  Evidence of progress towards goal:     Suicidal/Homicidal: Nowithout intent/plan  Therapist Response:  Therapist began the appointment asking the client how she has been doing since last seen. Therapist used CBT to engage using active listening and positive emotional support. Therapist used CBT to engage and give the client to discuss her thoughts about issues within her family and her ongoing process with grief. Therapist used CBT to collaborate and discuss prioritization of problems and then grief  steps. Therapist used CBT ask the client to identify her progress with frequency of use with coping skills with continued practice in her daily activity.    Therapist assigned homework for her to complete urgent matter regarding her home     Plan: Return again in 3 weeks.  Diagnosis: major depressive disorder, recurrent episode, moderate  Collaboration of Care: Patient refused AEB none requested by the client.  Patient/Guardian was advised Release of Information must be obtained prior to any record release in order to collaborate their care with an outside provider. Patient/Guardian was advised if they have not already done so to contact the registration department to sign all necessary forms in order for [place] to release information regarding their care.   Consent: Patient/Guardian gives verbal consent for treatment and assignment of benefits for services provided during this visit. Patient/Guardian expressed understanding and agreed to proceed.   [place], LCSW 12/09/2022

## 2022-12-09 NOTE — Telephone Encounter (Signed)
I can send in tramadol or tylenol 3 but nothing stronger.  She really needs to work on getting her A1C down which will allow her to safely have surgery and help with symptoms.  We can have her come back in to have this redrawn as it has been 4 1/2 months since last drawn

## 2022-12-09 NOTE — Telephone Encounter (Signed)
Patient called asked if she can get something other than Gabapentin for the pain she is experiencing in both of her hands.   The number to contact patient is [phone]

## 2022-12-09 NOTE — Telephone Encounter (Signed)
I spoke with patient. She would like for you to send in Tylenol #3 to [facility].    FYI--She has appt with PCP on 12/12/2022 and appt scheduled with you all on 12/13/2022. She has not had recent A1C drawn.

## 2022-12-10 ENCOUNTER — Other Ambulatory Visit: Payer: Self-pay | Admitting: Physician Assistant

## 2022-12-10 MED ORDER — ACETAMINOPHEN-CODEINE 300-30 MG PO TABS: 1.0000 | ORAL_TABLET | Freq: Two times a day (BID) | ORAL | 0 refills | Status: DC | PRN

## 2022-12-10 NOTE — Telephone Encounter (Signed)
Sent in

## 2022-12-11 ENCOUNTER — Other Ambulatory Visit: Payer: Self-pay

## 2022-12-11 ENCOUNTER — Ambulatory Visit: Payer: Commercial Managed Care - HMO | Attending: Podiatry

## 2022-12-11 DIAGNOSIS — M6281 Muscle weakness (generalized): Secondary | ICD-10-CM | POA: Diagnosis present

## 2022-12-11 DIAGNOSIS — R2689 Other abnormalities of gait and mobility: Secondary | ICD-10-CM | POA: Insufficient documentation

## 2022-12-11 DIAGNOSIS — M722 Plantar fascial fibromatosis: Secondary | ICD-10-CM | POA: Diagnosis not present

## 2022-12-11 DIAGNOSIS — M79672 Pain in left foot: Secondary | ICD-10-CM | POA: Diagnosis present

## 2022-12-11 DIAGNOSIS — M79671 Pain in right foot: Secondary | ICD-10-CM | POA: Insufficient documentation

## 2022-12-11 NOTE — Therapy (Signed)
OUTPATIENT PHYSICAL THERAPY LOWER EXTREMITY EVALUATION   Patient Name: Whitney Benton MRN: [id number] DOB:1971/02/23, 51 y.o., female Today's Date: 12/11/2022  END OF SESSION:  PT End of Session - 12/11/22 1707     Visit Number 1    Number of Visits 17    Date for PT Re-Evaluation 02/05/23    Authorization Type [employer]    PT Start Time 1658    PT Stop Time 7209    PT Time Calculation (min) 32 min    Activity Tolerance Patient tolerated treatment well    Behavior During Therapy WFL for tasks assessed/performed             Past Medical History:  Diagnosis Date   Allergy    Anxiety    Arthritis    Asthma    Depression    Diabetes mellitus without complication ([place])    GERD (gastroesophageal reflux disease)    Glaucoma suspect    Hyperlipidemia    Trichomonas infection    Past Surgical History:  Procedure Laterality Date   CESAREAN SECTION     UPPER GASTROINTESTINAL ENDOSCOPY     VAGINAL HYSTERECTOMY  12/23/2004   Fibroids, menorrhagia, benign pathology   Patient Active Problem List   Diagnosis Date Noted   Morbid obesity ([place]) 04/04/2022   Former smoker 04/30/2021   Major depressive disorder, recurrent episode, moderate ([place]) 04/30/2021   Substance induced mood disorder ([place]) 03/22/2021   Generalized anxiety disorder 03/22/2021   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   DKA (diabetic ketoacidosis) ([place]) 01/23/2021   Glaucoma suspect 04/12/2020   DKA, type 2, not at goal [facility]) 10/02/2019   AKI (acute kidney injury) ([place]) 10/02/2019   Hyperkalemia 10/02/2019   Leukocytosis 10/02/2019   Abnormal LFTs 10/02/2019   Stressful life events affecting family and household 08/06/2019   New onset type 2 diabetes mellitus ([place]) 02/04/2019   Obesity (BMI 35.0-39.9 without comorbidity) 02/04/2019   Tobacco dependence 02/04/2019   Left arm weakness 12/06/2014   Paresthesias/numbness 12/06/2014   Tobacco abuse 11/14/2014   Depression    Mixed incontinence  06/27/2014   History of TVH in 2006 for fibroids and menorrhagia; benign pathology 09/08/2011    PCP: Ladell Pier, MD  REFERRING PROVIDER: Yevonne Pax, DPM  REFERRING DIAG: M72.2 (ICD-10-CM) - Plantar fasciitis, bilateral   THERAPY DIAG:  Pain in right foot - Plan: PT plan of care cert/re-cert  Pain in left foot - Plan: PT plan of care cert/re-cert  Muscle weakness (generalized) - Plan: PT plan of care cert/re-cert  Other abnormalities of gait and mobility - Plan: PT plan of care cert/re-cert  Rationale for Evaluation and Treatment: Rehabilitation  ONSET DATE: Chronic  SUBJECTIVE:   SUBJECTIVE STATEMENT: Pt presents to PT with reports of chronic bilateral foot/heel pain. Notes pain has been gradually getting worse over the last year. Both feet are equally as painful, denies anything that alleviates discomfort. Pt promotes N/T in each foot, does note that the first step out of bed is the most painful.   PERTINENT HISTORY: Anxiety, Depression, DMII  PAIN:  Are you having pain?  Yes: NPRS scale: 9/10 Worst: 10/10 Pain location: bilateral heels Pain description: sharp Aggravating factors: stairs, first step out of bed Relieving factors: none  PRECAUTIONS: None  WEIGHT BEARING RESTRICTIONS: No  FALLS:  Has patient fallen in last 6 months? 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  PLOF: Independent and Independent  with basic ADLs  PATIENT GOALS: decrease pain in feet to improve comfort with walking   NEXT MD VISIT:   OBJECTIVE:   DIAGNOSTIC FINDINGS:   See imaging   PATIENT SURVEYS:  FOTO: 28% function; 52% predicted  COGNITION: Overall cognitive status: Within functional limits for tasks assessed     SENSATION: Not tested  POSTURE: rounded shoulders, forward head, and larger body habitus  PALPATION: TTP to bilateral heels, bilateral  gastroc  LOWER EXTREMITY ROM:  Active ROM Right eval Left eval  Hip flexion    Hip extension    Hip abduction    Hip adduction    Hip internal rotation    Hip external rotation    Knee flexion    Knee extension    Ankle dorsiflexion -5 -10  Ankle plantarflexion    Ankle inversion    Ankle eversion     (Blank rows = not tested)  LOWER EXTREMITY MMT:  MMT Right eval Left eval  Hip flexion    Hip extension    Hip abduction    Hip adduction    Hip internal rotation    Hip external rotation    Knee flexion    Knee extension    Ankle dorsiflexion    Ankle plantarflexion 4/5 3+/5 p!  Ankle inversion    Ankle eversion     (Blank rows = not tested)  LOWER EXTREMITY SPECIAL TESTS:  DNT  FUNCTIONAL TESTS:  Five Time Sit to Stand: 22 seconds SLS: 4" R  2" L - (L painful)  GAIT: Distance walked: 33f Assistive device utilized: None Level of assistance: Complete Independence Comments: antalgica gait L, decreased heel strike  TREATMENT: [facility] Adult PT Treatment:                                                DATE: 10/11/2022 Therapeutic Exercise: Calf stretch with towel x 30" each Towel scrunch x 30" each Seated PF BTB x 10 each Seated plantar fascia mobilization x 30" R   PATIENT EDUCATION:  Education details: eval findings, FOTO, HEP, POC Person educated: Patient Education method: Explanation, Demonstration, and Handouts Education comprehension: verbalized understanding and returned demonstration  HOME EXERCISE PROGRAM: [email or web address]: [email or web address] Date: 12/11/2022 Prepared by: DOctavio Manns Exercises - Long Sitting Calf Stretch with Strap  - 2 x daily - 7 x weekly - 2-3 reps - 30 sec hold - Towel Scrunches  - 2 x daily - 7 x weekly - 2 reps - 60 sec hold - Seated Plantar Fascia Mobilization with Small Ball  - 2 x daily - 7 x weekly - 3 sets - 10 reps - Ankle and Toe Plantarflexion with Resistance  - 2 x daily - 7 x  weekly - 3 sets - 10 reps - blue theraband hold  ASSESSMENT:  CLINICAL IMPRESSION: Patient is a 51y.o. F who was seen today for physical therapy evaluation and treatment for bilateral foot pain secondary to plantar fasciitis. Physical findings are consistent with DPM impression as pt has significant decrease in calf length, painful palpation to bilateral heels, and decrease functional mobility/ankle stability. Her FOTO score indicates significant decrease in functional ability below PLOF. Pt would benefit from skilled PT services working on improving calf length and strengthening foot intrinsics in order to decrease pain and improve function.   OBJECTIVE IMPAIRMENTS: Abnormal gait,  decreased activity tolerance, decreased mobility, difficulty walking, decreased ROM, decreased strength, and pain.   ACTIVITY LIMITATIONS: standing, squatting, stairs, transfers, and locomotion level  PARTICIPATION LIMITATIONS: meal prep, cleaning, laundry, driving, community activity, and yard work  PERSONAL FACTORS: Fitness and 3+ comorbidities: Anxiety, Depression, DMII  are also affecting patient's functional outcome.   REHAB POTENTIAL: Good  CLINICAL DECISION MAKING: Stable/uncomplicated  EVALUATION COMPLEXITY: Low   GOALS: Goals reviewed with patient? No  SHORT TERM GOALS: Target date: 01/01/2023   Pt will be compliant and knowledgeable with initial HEP for improved comfort and carryover Baseline: initial HEP given  Goal status: INITIAL  2.  Pt will self report bilateral heel pain no greater than 6/10 for improved comfort and functional ability Baseline: 10/10 at worst Goal status: INITIAL   LONG TERM GOALS: Target date: 02/05/2023   Pt will self report bilateral heel pain no greater than 3/10 for improved comfort and functional ability Baseline: 10/10 at worst Goal status: INITIAL   2.  Pt will improve FOTO function score to no less than 52% as proxy for functional improvement Baseline: 28%  function Goal status: INITIAL   3.  Pt will improve Five Time Sit to Stand to no greater than 15 seconds for improved balance and functional mobility Baseline: 22 seconds with pain Goal status: INITIAL  4.  Pt will improve bilateral ankle DF to no less than 5 for improved gait and decreased pain  Baseline: see chart Goal status: INITIAL  5.  Pt will improve bilateral SLS time to no less than 15 seconds for improved balance and ankle stability Baseline: 4" R  2" L - (L painful) Goal status: INITIAL   PLAN:  PT FREQUENCY: 2x/week  PT DURATION: 8 weeks  PLANNED INTERVENTIONS: Therapeutic exercises, Therapeutic activity, Neuromuscular re-education, Balance training, Gait training, Patient/Family education, Self Care, Joint mobilization, Aquatic Therapy, Dry Needling, Electrical stimulation, Cryotherapy, Moist heat, Manual therapy, and Re-evaluation  PLAN FOR NEXT SESSION: assess HEP response, ankle strengthening, calf stretching, TPDN?   Ward Chatters, PT 12/11/2022, 5:38 PM

## 2022-12-12 ENCOUNTER — Ambulatory Visit: Payer: Commercial Managed Care - HMO | Attending: Internal Medicine | Admitting: Internal Medicine

## 2022-12-12 ENCOUNTER — Other Ambulatory Visit: Payer: Self-pay

## 2022-12-12 VITALS — BP 111/78 | HR 98 | Temp 98.3°F | Ht 63.0 in | Wt 225.0 lb

## 2022-12-12 DIAGNOSIS — R1010 Upper abdominal pain, unspecified: Secondary | ICD-10-CM

## 2022-12-12 DIAGNOSIS — F331 Major depressive disorder, recurrent, moderate: Secondary | ICD-10-CM

## 2022-12-12 DIAGNOSIS — N951 Menopausal and female climacteric states: Secondary | ICD-10-CM | POA: Diagnosis not present

## 2022-12-12 DIAGNOSIS — K829 Disease of gallbladder, unspecified: Secondary | ICD-10-CM

## 2022-12-12 DIAGNOSIS — E1165 Type 2 diabetes mellitus with hyperglycemia: Secondary | ICD-10-CM | POA: Diagnosis not present

## 2022-12-12 DIAGNOSIS — E669 Obesity, unspecified: Secondary | ICD-10-CM

## 2022-12-12 DIAGNOSIS — D649 Anemia, unspecified: Secondary | ICD-10-CM

## 2022-12-12 DIAGNOSIS — Z794 Long term (current) use of insulin: Secondary | ICD-10-CM

## 2022-12-12 LAB — GLUCOSE, POCT (MANUAL RESULT ENTRY): POC Glucose: 296 mg/dl — AB (ref 70–99)

## 2022-12-12 LAB — POCT GLYCOSYLATED HEMOGLOBIN (HGB A1C): HbA1c, POC (controlled diabetic range): 10.8 % — AB (ref 0.0–7.0)

## 2022-12-12 MED ORDER — NOVOLOG FLEXPEN 100 UNIT/ML ~~LOC~~ SOPN
PEN_INJECTOR | SUBCUTANEOUS | 2 refills | Status: DC
  Filled 2022-12-12: qty 15, 20d supply, fill #0

## 2022-12-12 MED ORDER — INSULIN ASPART 100 UNIT/ML FLEXPEN
PEN_INJECTOR | SUBCUTANEOUS | 2 refills | Status: DC
  Filled 2022-12-12: qty 15, 20d supply, fill #0

## 2022-12-12 MED ORDER — BASAGLAR KWIKPEN 100 UNIT/ML ~~LOC~~ SOPN: 70.0000 [IU] | PEN_INJECTOR | Freq: Every morning | SUBCUTANEOUS | 6 refills | Status: DC

## 2022-12-12 NOTE — Progress Notes (Signed)
Patient ID: Whitney Benton, female    DOB: 02-11-1971  MRN: [id number]  CC: Diabetes (DM f/u. Poss med refill. /No to flu vax.)   Subjective: Whitney Benton is a 51 y.o. female who presents for chronic ds management Her concerns today include:  Pt with hx of depression, migraines (Dr. Tomi Likens), mixed incontinence, DM, obesity, HL, vit D def, former smoker, on metoprolol for tachycardia   DM: Results for orders placed or performed in visit on 12/12/22  POCT glucose (manual entry)  Result Value Ref Range   POC Glucose 296 (A) 70 - 99 mg/dl  POCT glycosylated hemoglobin (Hb A1C)  Result Value Ref Range   Hemoglobin A1C     HbA1c POC (<> result, manual entry)     HbA1c, POC (prediabetic range)     HbA1c, POC (controlled diabetic range) 10.8 (A) 0.0 - 7.0 %   A1C has not improved from last visit; it was 10.2 4 mths ago Started CGM Libre x 2 wks -time in range is 35%, time above BS 240 is 29%.  Most readings above 200.  Low BS events in past wk was zero and 1 in past 2 wks -Should be on Trulicity 4.5 mg Q wk, Glargine 62 units daily, Novolog 20 units with meals and Metformin 500 mg BID.  Reports she is very consistent with take her meds -She feels stress contributes to her elev BS.  Eating habits needs to be better; eating on the go most of the time; not doing much meal preps.  Weight 4 months ago was 222 pounds.  Today she is 225 pounds.  She has been having pain in upper abdomin and nausea.  Not eating much because pain in upper abdomen when she eats.  pain located epigastric region and radiates around to LT side of her abdomen and LT mid back. She she has seen the gastroenterologist Dr. Henrene Pastor and had EGD done which revealed some superficial erosions along the body of the stomach consistent with NSAID induced pathology.  Gallbladder ultrasound revealed small amount of biliary sludge in the gallbladder with 3 mm gallbladder polyp.  She was referred to [facility] surgery by the  gastroenterologist to be evaluated for possible biliary colic.  Patient states the surgeon recommended cholecystectomy to be done next month but she does not have the co-pay.  Of note however her pain is in the epigastric region and the left upper quadrant of the abdomen..    New anemia: I note no anemia on CBC done 2 months ago.  H/H was 11.9/35.8.  Previously was 13.5/40.9.  She has had a hysterectomy. C-scope 06/2022 and EGD 09/2022  Hx of hysterectomy 2006 due to fibroids.   Ovaries left in place Endorses hot flashes.  Using the HRT vaginal inserts but not regular.   Pos dep:  still followed by [facility] for MDD/GAD/insomia.  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   DKA (diabetic ketoacidosis) ([place]) 01/23/2021   Glaucoma suspect 04/12/2020   DKA, type 2, not at goal [facility]) 10/02/2019   AKI (acute kidney injury) ([place]) 10/02/2019   Hyperkalemia 10/02/2019   Leukocytosis 10/02/2019   Abnormal LFTs 10/02/2019  Stressful life events affecting family and household 08/06/2019   New onset type 2 diabetes mellitus ([place]) 02/04/2019   Obesity (BMI 35.0-39.9 without comorbidity) 02/04/2019   Tobacco dependence 02/04/2019   Left arm weakness 12/06/2014   Paresthesias/numbness 12/06/2014   Tobacco abuse 11/14/2014   Depression    Mixed incontinence 06/27/2014   History of TVH in 2006 for fibroids and menorrhagia; benign pathology 09/08/2011     Current Outpatient Medications on File Prior to Visit  Medication Sig Dispense Refill   acetaminophen (TYLENOL) 500 MG tablet Take 1 tablet (500 mg total) by mouth every 6 (six) hours as  needed. 30 tablet 0   acetaminophen-codeine (TYLENOL #3) 300-30 MG tablet Take 1 tablet by mouth 2 (two) times daily as needed for moderate pain. 30 tablet 0   albuterol (VENTOLIN HFA) 108 (90 Base) MCG/ACT inhaler Inhale 2 puffs into the lungs every 6 (six) hours as needed for wheezing or shortness of breath (Cough). 18 g 0   atorvastatin (LIPITOR) 40 MG tablet Take 1 tablet (40 mg total) by mouth daily. 90 tablet 2   Blood Glucose Monitoring Suppl (TRUE METRIX METER) w/Device KIT Check blood sugars three times a day 1 kit 0   busPIRone (BUSPAR) 15 MG tablet Take 1 tablet (15 mg total) by mouth 3 (three) times daily. 90 tablet 3   Continuous Blood Gluc Receiver (FREESTYLE LIBRE 2 READER) DEVI Use to check blood sugar three times daily. 1 each 0   Continuous Blood Gluc Sensor (FREESTYLE LIBRE 2 SENSOR) MISC Use to check blood sugar three times daily. 2 each 3   Continuous Blood Gluc Sensor (FREESTYLE LIBRE 2 SENSOR) MISC take as directed 1 each 0   cycloSPORINE (RESTASIS) 0.05 % ophthalmic emulsion Apply 1 drop into both eyes twice a day 180 each 3   doxepin (SINEQUAN) 25 MG capsule Take 1 capsule (25 mg total) by mouth at bedtime. 30 capsule 3   DULoxetine (CYMBALTA) 20 MG capsule Take 1 capsule (20 mg total) by mouth daily. 30 capsule 3   DULoxetine (CYMBALTA) 60 MG capsule Take 1 capsule (60 mg total) by mouth daily. 30 capsule 3   Erenumab-aooe (AIMOVIG) 140 MG/ML SOAJ Inject 140 mg into the skin every 28 (twenty-eight) days. 1.12 mL 5   Estradiol 10 MCG TABS vaginal tablet Insert one tab intravaginally 2 times a week 8 tablet 4   fluticasone (FLONASE) 50 MCG/ACT nasal spray Place 1 spray into both nostrils daily. Begin by using 2 sprays in each nare daily for 3 to 5 days, then decrease to 1 spray in each nare daily. 32 mL 1   gabapentin (NEURONTIN) 300 MG capsule TAKE 1 CAPSULE BY MOUTH THREE TIMES DAILY 90 capsule 0   glucose blood (TRUE METRIX BLOOD GLUCOSE TEST) test strip Use to check blood  sugar 3 times daily 100 each 2   hydrOXYzine (ATARAX) 25 MG tablet TAKE 1 TABLET (25 MG TOTAL) BY MOUTH 3 (THREE) TIMES DAILY AS NEEDED. 90 tablet 3   Insulin Pen Needle (PEN NEEDLES) 31G X 6 MM MISC Use as directed 100 each 0   lidocaine (LIDODERM) 5 % Place 1 patch onto the skin daily. Remove & Discard patch within 12 hours or as directed by MD 30 patch 0   metFORMIN (GLUCOPHAGE-XR) 500 MG 24 hr tablet Take 1 tablet (500 mg total) by mouth 2 (two) times daily. 180 tablet 1   methocarbamol (ROBAXIN) 500 MG tablet Take 1 tablet (500 mg total) by mouth 3 (  three) times daily as needed for muscle spasms. 20 tablet 2   metoprolol tartrate (LOPRESSOR) 25 MG tablet Take 0.5 tablets (12.5 mg total) by mouth 2 (two) times daily. 90 tablet 2   Multiple Vitamin (MULTIVITAMIN ADULT) TABS Take 1 tablet by mouth daily.     Multiple Vitamins-Minerals (EYE VITAMINS PO) Take 1 tablet by mouth daily.     omeprazole (PRILOSEC) 40 MG capsule Take 1 capsule (40 mg total) by mouth daily. Take 30 minutes prior to breakfast 30 capsule 3   ondansetron (ZOFRAN-ODT) 4 MG disintegrating tablet Take 1 tablet (4 mg total) by mouth every 8 (eight) hours as needed for nausea or vomiting. 20 tablet 0   QUEtiapine 150 MG TABS Take 150 mg by mouth at bedtime. 30 tablet 3   SUMAtriptan (IMITREX) 100 MG tablet TAKE 1 TABLET EARLIEST ONSET OF MIGRAINE. MAY REPEAT IN 2 HOURS IF HEADACHE PERSISTS OR RECURS. MAXIMUM 2 TABLETS IN 24 HOURS. 10 tablet 5   triamcinolone cream (KENALOG) 0.1 % Apply 1 Application topically 2 (two) times daily. 30 g 0   TRUEPLUS LANCETS 28G MISC Check blood sugars three time a day 100 each 12   zolpidem (AMBIEN) 5 MG tablet Take 1 tablet (5 mg total) by mouth at bedtime as needed for sleep. 30 tablet 1   fexofenadine (ALLEGRA) 180 MG tablet Take 1 tablet (180 mg total) by mouth daily. (Patient not taking: Reported on 10/17/2022) 90 tablet 1   meloxicam (MOBIC) 15 MG tablet Take 1 tablet (15 mg total) by mouth  daily. (Patient not taking: Reported on 12/12/2022) 30 tablet 2   [DISCONTINUED] cetirizine (ZYRTEC) 10 MG tablet Take 1 tablet (10 mg total) by mouth daily. (Patient not taking: No sig reported) 30 tablet 1   No current facility-administered medications on file prior to visit.    Allergies  Allergen Reactions   Aspirin Hives   Oxycodone Nausea And Vomiting    Social History   Socioeconomic History   Marital status: Significant Other    Spouse name: Not on file   Number of children: 3   Years of education: 14   Highest education level: Not on file  Occupational History   Not on file  Tobacco Use   Smoking status: Former    Packs/day: 0.25    Years: 8.00    Total pack years: 2.00    Types: Cigarettes    Quit date: 2021    Years since quitting: 2.9   Smokeless tobacco: Never  Vaping Use   Vaping Use: Never used  Substance and Sexual Activity   Alcohol use: Not Currently    Comment: occasional   Drug use: No   Sexual activity: Yes    Birth control/protection: Surgical  Other Topics Concern   Not on file  Social History Narrative   Patient lives at home with mother and father , one story    Patient has 3 children    Patient is single   Patient has 14 years of education    Patient is right handed    Caffeine none   Social Determinants of Health   Financial Resource Strain: Not on file  Food Insecurity: Food Insecurity Present (05/24/2022)   Hunger Vital Sign    Worried About Running Out of Food in the Last Year: Sometimes true    Ran Out of Food in the Last Year: Sometimes true  Transportation Needs: No Transportation Needs (01/30/2021)   PRAPARE - Transportation    Lack of  Transportation (Medical): No    Lack of Transportation (Non-Medical): No  Physical Activity: Not on file  Stress: Not on file  Social Connections: Not on file  Intimate Partner Violence: Not on file    Family History  Problem Relation Age of Onset   Diabetes Mother    Mental illness  Mother    Depression Mother    Hypertension Mother    Colon cancer Father 31   Hypertension Father    Diabetes Father    Colon cancer Paternal Grandmother    Esophageal cancer Neg Hx    Stomach cancer Neg Hx    Rectal cancer Neg Hx     Past Surgical History:  Procedure Laterality Date   CESAREAN SECTION     UPPER GASTROINTESTINAL ENDOSCOPY     VAGINAL HYSTERECTOMY  12/23/2004   Fibroids, menorrhagia, benign pathology    ROS: Review of Systems Negative except as stated above  PHYSICAL EXAM: BP 111/78 (BP Location: Left Arm, Patient Position: Sitting, Cuff Size: Large)   Pulse 98   Temp 98.3 F (36.8 C) (Oral)   Ht _0  (1.6 m)   Wt 225 lb (102.1 kg)   SpO2 96%   BMI 39.86 kg/m   Wt Readings from Last 3 Encounters:  12/12/22 225 lb (102.1 kg)  10/17/22 232 lb (105.2 kg)  10/16/22 230 lb (104.3 kg)    Physical Exam  General appearance - alert, well appearing, and in no distress Mental status - normal mood, behavior, speech, dress, motor activity, and thought processes Neck - supple, no significant adenopathy Chest - clear to auscultation, no wheezes, rales or rhonchi, symmetric air entry Heart - normal rate, regular rhythm, normal S1, S2, no murmurs, rubs, clicks or gallops Abdomen - obese, distended, nontender, no organomegally Extremities - no LEedema     12/12/2022    9:41 AM 12/12/2022    9:40 AM 09/24/2022   11:05 AM  Depression screen PHQ 2/9  Decreased Interest _1 Down, Depressed, Hopeless _2 PHQ - 2 Score _3 Altered sleeping _4 Tired, decreased energy _5 Change in appetite _6 Feeling bad or failure about yourself  _7 Trouble concentrating _8 Moving slowly or fidgety/restless 0 0 0  Suicidal thoughts 0 0 0  PHQ-9 Score _9 Difficult doing work/chores   Very difficult       Latest Ref Rng & Units 10/31/2022    3:14 PM 10/09/2022   10:32 AM 01/24/2022   11:34 AM  CMP  Glucose 70 - 99 mg/dL  273  363    BUN 6 - 23 mg/dL  9  14   Creatinine 0.40 - 1.20 mg/dL  0.85  1.04   Sodium 135 - 145 mEq/L  139  140   Potassium 3.5 - 5.1 mEq/L  3.8  4.6   Chloride 96 - 112 mEq/L  103  100   CO2 19 - 32 mEq/L  30  25   Calcium 8.4 - 10.5 mg/dL  9.0  9.3   Total Protein 6.0 - 8.3 g/dL 7.5  6.6    Total Bilirubin 0.2 - 1.2 mg/dL 0.4  0.3    Alkaline Phos 39 - 117 U/L 164  129    AST 0 - 37 U/L 16  13    ALT 0 - 35 U/L 17  15     Lipid  Panel     Component Value Date/Time   CHOL 177 11/30/2021 1039   TRIG 181 (H) 11/30/2021 1039   HDL 40 11/30/2021 1039   CHOLHDL 4.4 11/30/2021 1039   CHOLHDL 3.2 11/14/2014 0707   VLDL 19 11/14/2014 0707   LDLCALC 105 (H) 11/30/2021 1039    CBC    Component Value Date/Time   WBC 6.2 10/09/2022 1032   RBC 4.17 10/09/2022 1032   HGB 11.9 (L) 10/09/2022 1032   HGB 13.5 11/30/2021 1039   HCT 35.8 (L) 10/09/2022 1032   HCT 40.9 11/30/2021 1039   PLT 343.0 10/09/2022 1032   PLT 372 11/30/2021 1039   MCV 85.7 10/09/2022 1032   MCV 85 11/30/2021 1039   MCH 28.0 11/30/2021 1039   MCH 29.3 01/25/2021 0334   MCHC 33.1 10/09/2022 1032   RDW 15.4 10/09/2022 1032   RDW 13.8 11/30/2021 1039   LYMPHSABS 1.5 01/23/2021 1052   LYMPHSABS 2.4 07/14/2019 1058   MONOABS 0.7 01/23/2021 1052   EOSABS 0.0 01/23/2021 1052   EOSABS 0.1 07/14/2019 1058   BASOSABS 0.0 01/23/2021 1052   BASOSABS 0.0 07/14/2019 1058    ASSESSMENT AND PLAN:  1. Type 2 diabetes mellitus with hyperglycemia, with long-term current use of insulin (HCC) Not at goal.  I question whether she is really taking her medications consistently. Given the chronic gastric and left upper quadrant abdominal pain with nausea that she is having and sludge in GB, I recommend stopping Trulicity. -Increase NovoLog to 24 units with meals and increase glargine to 70 units. Strongly encourage better meal planning. Recommended referral to endocrinology.  Patient is agreeable to this. Follow-up with me in 5  weeks. - POCT glucose (manual entry) - POCT glycosylated hemoglobin (Hb A1C) - insulin aspart (NOVOLOG FLEXPEN) 100 UNIT/ML FlexPen; INJECT 24 UNITS BEFORE BREAKFAST, 24 UNITS BEFORE LUNCH, AND 24 UNITS BEFORE DINNER.  Dispense: 15 mL; Refill: 2 - Insulin Glargine (BASAGLAR KWIKPEN) 100 UNIT/ML; Inject 70 Units into the skin in the morning.  Dispense: 30 mL; Refill: 6 - Ambulatory referral to Endocrinology  2. Pain of upper abdomen Stop Trulicity.  We will obtain a CAT scan of the abdomen. - Lipase - Basic Metabolic Panel - CT ABDOMEN W CONTRAST; Future  3. Gall bladder disease Would like to obtain CAT scan of the abdomen before she decides to pursue cholecystectomy.  It seems that this is on hold anyway as she does not have the co-pay for the surgery  4. Obesity (BMI 35.0-39.9 without comorbidity) See #1 above.  5. Normocytic anemia - Iron, TIBC and Ferritin Panel - Vitamin B12 - CBC  6. Hot flash, menopausal I went over with her how to use the estrogen vaginal insert.  It is to be used twice a week.  I told her to pick any 2 days of the week Monday and Friday. - FSH/LH  7. Major depressive disorder, recurrent episode, moderate ([place]) Followed by behavioral health.    Patient was given the opportunity to ask questions.  Patient verbalized understanding of the plan and was able to repeat key elements of the plan.   This documentation was completed using Radio producer.  Any transcriptional errors are unintentional.  Orders Placed This Encounter  Procedures   CT ABDOMEN W CONTRAST   Iron, TIBC and Ferritin Panel   Vitamin B12   Lipase   CBC   FSH/LH   Basic Metabolic Panel   Ambulatory referral to Endocrinology   POCT glucose (manual  entry)   POCT glycosylated hemoglobin (Hb A1C)     Requested Prescriptions   Signed Prescriptions Disp Refills   insulin aspart (NOVOLOG FLEXPEN) 100 UNIT/ML FlexPen 15 mL 2    Sig: INJECT 24 UNITS BEFORE  BREAKFAST, 24 UNITS BEFORE LUNCH, AND 24 UNITS BEFORE DINNER.   Insulin Glargine (BASAGLAR KWIKPEN) 100 UNIT/ML 30 mL 6    Sig: Inject 70 Units into the skin in the morning.    Return in about 5 weeks (around 01/16/2023).  Karle Plumber, MD, FACP

## 2022-12-12 NOTE — Patient Instructions (Signed)
Stop Trulicity. Increase glargine insulin to 70 units daily. Increase NovoLog insulin to 24 units with meals.

## 2022-12-13 ENCOUNTER — Ambulatory Visit: Payer: Commercial Managed Care - HMO | Admitting: Orthopaedic Surgery

## 2022-12-13 LAB — BASIC METABOLIC PANEL
BUN/Creatinine Ratio: 11 (ref 9–23)
BUN: 10 mg/dL (ref 6–24)
CO2: 26 mmol/L (ref 20–29)
Calcium: 9.6 mg/dL (ref 8.7–10.2)
Chloride: 100 mmol/L (ref 96–106)
Creatinine, Ser: 0.95 mg/dL (ref 0.57–1.00)
Glucose: 210 mg/dL — ABNORMAL HIGH (ref 70–99)
Potassium: 4.5 mmol/L (ref 3.5–5.2)
Sodium: 140 mmol/L (ref 134–144)
eGFR: 72 mL/min/{1.73_m2} (ref >59)

## 2022-12-13 LAB — FSH/LH
FSH: 43.8 m[IU]/mL
LH: 31.6 m[IU]/mL

## 2022-12-13 LAB — IRON,TIBC AND FERRITIN PANEL
Ferritin: 31 ng/mL (ref 15–150)
Iron Saturation: 24 % (ref 15–55)
Iron: 52 ug/dL (ref 27–159)
Total Iron Binding Capacity: 213 ug/dL — ABNORMAL LOW (ref 250–450)
UIBC: 161 ug/dL (ref 131–425)

## 2022-12-13 LAB — LIPASE: Lipase: 55 U/L (ref 14–72)

## 2022-12-13 LAB — CBC
Hematocrit: 40.4 % (ref 34.0–46.6)
Hemoglobin: 13.3 g/dL (ref 11.1–15.9)
MCH: 28.2 pg (ref 26.6–33.0)
MCHC: 32.9 g/dL (ref 31.5–35.7)
MCV: 86 fL (ref 79–97)
Platelets: 436 10*3/uL (ref 150–450)
RBC: 4.71 x10E6/uL (ref 3.77–5.28)
RDW: 12.9 % (ref 11.7–15.4)
WBC: 7.3 10*3/uL (ref 3.4–10.8)

## 2022-12-13 LAB — VITAMIN B12: Vitamin B-12: 739 pg/mL (ref 232–1245)

## 2022-12-18 ENCOUNTER — Encounter: Payer: Self-pay | Admitting: Orthopaedic Surgery

## 2022-12-18 ENCOUNTER — Other Ambulatory Visit: Payer: Self-pay

## 2022-12-18 ENCOUNTER — Ambulatory Visit (INDEPENDENT_AMBULATORY_CARE_PROVIDER_SITE_OTHER): Payer: Commercial Managed Care - HMO | Admitting: Orthopaedic Surgery

## 2022-12-18 VITALS — Ht 63.0 in | Wt 225.0 lb

## 2022-12-18 DIAGNOSIS — G5603 Carpal tunnel syndrome, bilateral upper limbs: Secondary | ICD-10-CM | POA: Diagnosis not present

## 2022-12-18 MED ORDER — BASAGLAR KWIKPEN 100 UNIT/ML ~~LOC~~ SOPN
70.0000 [IU] | PEN_INJECTOR | Freq: Every day | SUBCUTANEOUS | 6 refills | Status: DC
  Filled 2022-12-18: qty 21, 30d supply, fill #0

## 2022-12-18 NOTE — Progress Notes (Signed)
Office Visit Note   Patient: Whitney Benton           Date of Birth: 10-24-1971           MRN: [id number] Visit Date: 12/18/2022              Requested by: Ladell Pier, MD [address] [place],  [place] [address] PCP: Ladell Pier, MD   Assessment & Plan: Visit Diagnoses:  1. Bilateral carpal tunnel syndrome     Plan: Impression is bilateral carpal tunnel syndrome moderate to severe.  She had nerve conduction studies about 3 years ago.  Her diabetes is still not under good control.  Most recent A1c was greater than 10.  She has just recently started [place] which should help things.  Gabapentin has not been effective either.  From my standpoint treatment options are limited due to uncontrolled diabetes.  Patient understands that once her A1c is lower than 8 we can proceed with either surgical release or cortisone injections.  She can follow-up needed.  Follow-Up Instructions: No follow-ups on file.   Orders:  No orders of the defined types were placed in this encounter.  No orders of the defined types were placed in this encounter.     Procedures: No procedures performed   Clinical Data: No additional findings.   Subjective: Chief Complaint  Patient presents with   Right Hand - Pain   Left Hand - Pain    HPI Whitney Benton returns today for bilateral carpal tunnel syndrome.  Her symptoms are getting worse.  She has sensitivity and numbness and tingling and she is dropping things from her hands.  She states that the nighttime splints hurt.  Tylenol 3 does not help.  Review of Systems   Objective: Vital Signs: Ht '5\' 3"'$  (1.6 m)   Wt 225 lb (102.1 kg)   BMI 39.86 kg/m   Physical Exam  Ortho Exam Examination of bilateral hands show positive carpal tunnel compressive signs. Specialty Comments:  No specialty comments available.  Imaging: No results found.   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   DKA (diabetic ketoacidosis) ([place]) 01/23/2021   Glaucoma suspect 04/12/2020   DKA, type 2, not at goal [facility]) 10/02/2019   AKI (acute kidney injury) ([place]) 10/02/2019   Hyperkalemia 10/02/2019   Leukocytosis 10/02/2019   Abnormal LFTs 10/02/2019   Stressful life events affecting family and household 08/06/2019   New onset type 2 diabetes mellitus ([place]) 02/04/2019   Obesity (BMI 35.0-39.9 without comorbidity) 02/04/2019   Tobacco dependence 02/04/2019   Left arm weakness 12/06/2014   Paresthesias/numbness 12/06/2014   Tobacco abuse 11/14/2014   Depression    Mixed incontinence 06/27/2014   History of TVH in 2006 for fibroids and menorrhagia; benign pathology 09/08/2011   Past Medical History:  Diagnosis Date   Allergy    Anxiety    Arthritis    Asthma    Depression    Diabetes mellitus without complication (HCC)    GERD (gastroesophageal reflux disease)    Glaucoma suspect    Hyperlipidemia    Trichomonas infection     Family History  Problem Relation Age of Onset   Diabetes Mother  Mental illness Mother    Depression Mother    Hypertension Mother    Colon cancer Father 36   Hypertension Father    Diabetes Father    Colon cancer Paternal Grandmother    Esophageal cancer Neg Hx    Stomach cancer Neg Hx    Rectal cancer Neg Hx     Past Surgical History:  Procedure Laterality Date   CESAREAN SECTION     UPPER GASTROINTESTINAL ENDOSCOPY     VAGINAL HYSTERECTOMY  12/23/2004   Fibroids, menorrhagia, benign pathology   Social History   Occupational History   Not on file  Tobacco Use   Smoking status: Former    Packs/day: 0.25    Years: 8.00    Total pack years: 2.00    Types: Cigarettes    Quit  date: 2021    Years since quitting: 2.9   Smokeless tobacco: Never  Vaping Use   Vaping Use: Never used  Substance and Sexual Activity   Alcohol use: Not Currently    Comment: occasional   Drug use: No   Sexual activity: Yes    Birth control/protection: Surgical

## 2022-12-19 ENCOUNTER — Other Ambulatory Visit: Payer: Self-pay

## 2022-12-24 ENCOUNTER — Ambulatory Visit: Payer: Commercial Managed Care - HMO | Attending: Podiatry

## 2022-12-24 DIAGNOSIS — M79671 Pain in right foot: Secondary | ICD-10-CM | POA: Diagnosis not present

## 2022-12-24 DIAGNOSIS — R2689 Other abnormalities of gait and mobility: Secondary | ICD-10-CM | POA: Diagnosis present

## 2022-12-24 DIAGNOSIS — M6281 Muscle weakness (generalized): Secondary | ICD-10-CM | POA: Diagnosis present

## 2022-12-24 DIAGNOSIS — M79672 Pain in left foot: Secondary | ICD-10-CM | POA: Diagnosis present

## 2022-12-24 NOTE — Therapy (Unsigned)
OUTPATIENT PHYSICAL THERAPY TREATMENT NOTE   Patient Name: Whitney Benton MRN: [id number] DOB:Mar 26, 1971, 52 y.o., female Today's Date: 12/24/2022  PCP: Ladell Pier, MD  REFERRING PROVIDER: Yevonne Pax, DPM   END OF SESSION:   PT End of Session - 12/24/22 1823     Visit Number 2    Number of Visits 17    Date for PT Re-Evaluation 02/05/23    Authorization Type [employer]    PT Start Time 1825    PT Stop Time 1905    PT Time Calculation (min) 40 min    Activity Tolerance Patient tolerated treatment well    Behavior During Therapy WFL for tasks assessed/performed             Past Medical History:  Diagnosis Date   Allergy    Anxiety    Arthritis    Asthma    Depression    Diabetes mellitus without complication ([place])    GERD (gastroesophageal reflux disease)    Glaucoma suspect    Hyperlipidemia    Trichomonas infection    Past Surgical History:  Procedure Laterality Date   CESAREAN SECTION     UPPER GASTROINTESTINAL ENDOSCOPY     VAGINAL HYSTERECTOMY  12/23/2004   Fibroids, menorrhagia, benign pathology   Patient Active Problem List   Diagnosis Date Noted   Morbid obesity ([place]) 04/04/2022   Former smoker 04/30/2021   Major depressive disorder, recurrent episode, moderate ([place]) 04/30/2021   Substance induced mood disorder ([place]) 03/22/2021   Generalized anxiety disorder 03/22/2021   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   DKA (diabetic ketoacidosis) ([place]) 01/23/2021   Glaucoma suspect 04/12/2020   DKA, type 2, not at goal [facility]) 10/02/2019   AKI (acute kidney injury) ([place]) 10/02/2019   Hyperkalemia 10/02/2019   Leukocytosis 10/02/2019   Abnormal LFTs 10/02/2019   Stressful life events affecting family and household 08/06/2019   New onset type 2 diabetes mellitus ([place]) 02/04/2019   Obesity (BMI 35.0-39.9 without comorbidity) 02/04/2019   Tobacco dependence 02/04/2019   Left arm weakness 12/06/2014   Paresthesias/numbness  12/06/2014   Tobacco abuse 11/14/2014   Depression    Mixed incontinence 06/27/2014   History of TVH in 2006 for fibroids and menorrhagia; benign pathology 09/08/2011    REFERRING DIAG: M72.2 (ICD-10-CM) - Plantar fasciitis, bilateral    THERAPY DIAG:  Pain in right foot  Pain in left foot  Muscle weakness (generalized)  Other abnormalities of gait and mobility  Rationale for Evaluation and Treatment Rehabilitation  PERTINENT HISTORY: Anxiety, Depression, DMII   PRECAUTIONS: None  SUBJECTIVE:  SUBJECTIVE STATEMENT:  Patient reports continued BIL heel and arch pain.   PAIN:  Are you having pain?  Yes: NPRS scale: 9/10 Worst: 10/10 Pain location: bilateral heels Pain description: sharp Aggravating factors: stairs, first step out of bed Relieving factors: none   OBJECTIVE: (objective measures completed at initial evaluation unless otherwise dated)   DIAGNOSTIC FINDINGS:             See imaging    PATIENT SURVEYS:  FOTO: 28% function; 52% predicted   COGNITION: Overall cognitive status: Within functional limits for tasks assessed                         SENSATION: Not tested   POSTURE: rounded shoulders, forward head, and larger body habitus   PALPATION: TTP to bilateral heels, bilateral gastroc   LOWER EXTREMITY ROM:   Active ROM Right eval Left eval  Hip flexion      Hip extension      Hip abduction      Hip adduction      Hip internal rotation      Hip external rotation      Knee flexion      Knee extension      Ankle dorsiflexion -5 -10  Ankle plantarflexion      Ankle inversion      Ankle eversion       (Blank rows = not tested)   LOWER EXTREMITY MMT:   MMT Right eval Left eval  Hip flexion      Hip extension      Hip abduction      Hip adduction       Hip internal rotation      Hip external rotation      Knee flexion      Knee extension      Ankle dorsiflexion      Ankle plantarflexion 4/5 3+/5 p!  Ankle inversion      Ankle eversion       (Blank rows = not tested)   LOWER EXTREMITY SPECIAL TESTS:  DNT   FUNCTIONAL TESTS:  Five Time Sit to Stand: 22 seconds SLS: 4" R  2" L - (L painful)   GAIT: Distance walked: 67f Assistive device utilized: None Level of assistance: Complete Independence Comments: antalgica gait L, decreased heel strike   TREATMENT: [facility] Adult PT Treatment:                                                DATE: 12/24/2022 Therapeutic Exercise: Nustep level 5 x 5 mins Slant board gastroc stretch x2' Standing heel raises 3x10 Standing toe raises 2x10 Rocker board PF/DF x2' Towel scrunches 2x1' BIL (pain R>L) BAPs PF/DF x15 BIL BAPs circles CW/CCW x10 each BIL Neuromuscular re-ed: FT stance x30" Semi tandem stance x30" BIL FT on Airex x30"  Semi tandem on Airex x30" BIL   [facility] Adult PT Treatment:                                                DATE: 10/11/2022 Therapeutic Exercise: Calf stretch with towel x 30" each Towel scrunch x 30" each Seated PF BTB x 10 each Seated plantar fascia  mobilization x 30" R    PATIENT EDUCATION:  Education details: eval findings, FOTO, HEP, POC Person educated: Patient Education method: Explanation, Demonstration, and Handouts Education comprehension: verbalized understanding and returned demonstration   HOME EXERCISE PROGRAM: Access Code: [facility] URL: [email or web address] Date: 12/11/2022 Prepared by: Octavio Manns   Exercises - Long Sitting Calf Stretch with Strap  - 2 x daily - 7 x weekly - 2-3 reps - 30 sec hold - Towel Scrunches  - 2 x daily - 7 x weekly - 2 reps - 60 sec hold - Seated Plantar Fascia Mobilization with Small Ball  - 2 x daily - 7 x weekly - 3 sets - 10 reps - Ankle and Toe Plantarflexion with Resistance  - 2 x daily - 7  x weekly - 3 sets - 10 reps - blue theraband hold   ASSESSMENT:   CLINICAL IMPRESSION: Patient present PT with continued reports of pain in BIL heels and arch of her feet reports HEP compliance, though she states the pain makes it difficult. Session today focused on stretching for BIL calf muscles and strengthening of calves and ankles as well as balance tasks. She is somewhat limited by pain throughout session, but is able to complete all prescribed exercises. Patient continues to benefit from skilled PT services and should be progressed as able to improve functional independence.     OBJECTIVE IMPAIRMENTS: Abnormal gait, decreased activity tolerance, decreased mobility, difficulty walking, decreased ROM, decreased strength, and pain.    ACTIVITY LIMITATIONS: standing, squatting, stairs, transfers, and locomotion level   PARTICIPATION LIMITATIONS: meal prep, cleaning, laundry, driving, community activity, and yard work   PERSONAL FACTORS: Fitness and 3+ comorbidities: Anxiety, Depression, DMII  are also affecting patient's functional outcome.    REHAB POTENTIAL: Good   CLINICAL DECISION MAKING: Stable/uncomplicated   EVALUATION COMPLEXITY: Low     GOALS: Goals reviewed with patient? No   SHORT TERM GOALS: Target date: 01/01/2023   Pt will be compliant and knowledgeable with initial HEP for improved comfort and carryover Baseline: initial HEP given  Goal status: INITIAL   2.  Pt will self report bilateral heel pain no greater than 6/10 for improved comfort and functional ability Baseline: 10/10 at worst Goal status: INITIAL    LONG TERM GOALS: Target date: 02/05/2023   Pt will self report bilateral heel pain no greater than 3/10 for improved comfort and functional ability Baseline: 10/10 at worst Goal status: INITIAL    2.  Pt will improve FOTO function score to no less than 52% as proxy for functional improvement Baseline: 28% function Goal status: INITIAL    3.  Pt will  improve Five Time Sit to Stand to no greater than 15 seconds for improved balance and functional mobility Baseline: 22 seconds with pain Goal status: INITIAL   4.  Pt will improve bilateral ankle DF to no less than 5 for improved gait and decreased pain  Baseline: see chart Goal status: INITIAL   5.  Pt will improve bilateral SLS time to no less than 15 seconds for improved balance and ankle stability Baseline: 4" R  2" L - (L painful) Goal status: INITIAL     PLAN:   PT FREQUENCY: 2x/week   PT DURATION: 8 weeks   PLANNED INTERVENTIONS: Therapeutic exercises, Therapeutic activity, Neuromuscular re-education, Balance training, Gait training, Patient/Family education, Self Care, Joint mobilization, Aquatic Therapy, Dry Needling, Electrical stimulation, Cryotherapy, Moist heat, Manual therapy, and Re-evaluation   PLAN FOR NEXT SESSION: assess HEP response,  ankle strengthening, calf stretching, TPDN?   Margarette Canada, PTA 12/24/2022, 6:24 PM

## 2022-12-25 ENCOUNTER — Ambulatory Visit (HOSPITAL_COMMUNITY): Payer: Medicaid Other | Admitting: Clinical

## 2022-12-26 ENCOUNTER — Ambulatory Visit: Payer: Commercial Managed Care - HMO

## 2022-12-26 DIAGNOSIS — M79672 Pain in left foot: Secondary | ICD-10-CM

## 2022-12-26 DIAGNOSIS — M79671 Pain in right foot: Secondary | ICD-10-CM

## 2022-12-26 DIAGNOSIS — M6281 Muscle weakness (generalized): Secondary | ICD-10-CM

## 2022-12-26 NOTE — Therapy (Signed)
OUTPATIENT PHYSICAL THERAPY TREATMENT NOTE   Patient Name: Whitney Benton MRN: [id number] DOB:[birth year], 52 y.o., female Today's Date: 12/26/2022  PCP: Ladell Pier, MD  REFERRING PROVIDER: Yevonne Pax, DPM   END OF SESSION:   PT End of Session - 12/26/22 1700     Visit Number 3    Number of Visits 17    Date for PT Re-Evaluation 02/05/23    Authorization Type [employer]    PT Start Time 1700    PT Stop Time 5035    PT Time Calculation (min) 40 min    Activity Tolerance Patient tolerated treatment well    Behavior During Therapy WFL for tasks assessed/performed              Past Medical History:  Diagnosis Date   Allergy    Anxiety    Arthritis    Asthma    Depression    Diabetes mellitus without complication ([place])    GERD (gastroesophageal reflux disease)    Glaucoma suspect    Hyperlipidemia    Trichomonas infection    Past Surgical History:  Procedure Laterality Date   CESAREAN SECTION     UPPER GASTROINTESTINAL ENDOSCOPY     VAGINAL HYSTERECTOMY  12/23/2004   Fibroids, menorrhagia, benign pathology   Patient Active Problem List   Diagnosis Date Noted   Morbid obesity ([place]) 04/04/2022   Former smoker 04/30/2021   Major depressive disorder, recurrent episode, moderate ([place]) 04/30/2021   Substance induced mood disorder ([place]) 03/22/2021   Generalized anxiety disorder 03/22/2021   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   DKA (diabetic ketoacidosis) ([place]) 01/23/2021   Glaucoma suspect 04/12/2020   DKA, type 2, not at goal [facility]) 10/02/2019   AKI (acute kidney injury) ([place]) 10/02/2019   Hyperkalemia 10/02/2019   Leukocytosis 10/02/2019   Abnormal LFTs 10/02/2019   Stressful life events affecting family and household 08/06/2019   New onset type 2 diabetes mellitus ([place]) 02/04/2019   Obesity (BMI 35.0-39.9 without comorbidity) 02/04/2019   Tobacco dependence 02/04/2019   Left arm weakness 12/06/2014   Paresthesias/numbness  12/06/2014   Tobacco abuse 11/14/2014   Depression    Mixed incontinence 06/27/2014   History of TVH in 2006 for fibroids and menorrhagia; benign pathology 09/08/2011    REFERRING DIAG: M72.2 (ICD-10-CM) - Plantar fasciitis, bilateral    THERAPY DIAG:  Pain in right foot  Pain in left foot  Muscle weakness (generalized)  Rationale for Evaluation and Treatment Rehabilitation  PERTINENT HISTORY: Anxiety, Depression, DMII   PRECAUTIONS: None  SUBJECTIVE:  SUBJECTIVE STATEMENT:  Pt presents to PT with continued reports of bilateral heel pain. Has been compliant with HEP with no adverse effect. She is ready to begin PT at this time.   PAIN:  Are you having pain?  Yes: NPRS scale: 8.5/10 Worst: 10/10 Pain location: bilateral heels Pain description: sharp Aggravating factors: stairs, first step out of bed Relieving factors: none   OBJECTIVE: (objective measures completed at initial evaluation unless otherwise dated)   DIAGNOSTIC FINDINGS:             See imaging    PATIENT SURVEYS:  FOTO: 28% function; 52% predicted   COGNITION: Overall cognitive status: Within functional limits for tasks assessed                         SENSATION: Not tested   POSTURE: rounded shoulders, forward head, and larger body habitus   PALPATION: TTP to bilateral heels, bilateral gastroc   LOWER EXTREMITY ROM:   Active ROM Right eval Left eval  Hip flexion      Hip extension      Hip abduction      Hip adduction      Hip internal rotation      Hip external rotation      Knee flexion      Knee extension      Ankle dorsiflexion -5 -10  Ankle plantarflexion      Ankle inversion      Ankle eversion       (Blank rows = not tested)   LOWER EXTREMITY MMT:   MMT Right eval Left eval  Hip flexion       Hip extension      Hip abduction      Hip adduction      Hip internal rotation      Hip external rotation      Knee flexion      Knee extension      Ankle dorsiflexion      Ankle plantarflexion 4/5 3+/5 p!  Ankle inversion      Ankle eversion       (Blank rows = not tested)   LOWER EXTREMITY SPECIAL TESTS:  DNT   FUNCTIONAL TESTS:  Five Time Sit to Stand: 22 seconds SLS: 4" R  2" L - (L painful)   GAIT: Distance walked: 1f Assistive device utilized: None Level of assistance: Complete Independence Comments: antalgica gait L, decreased heel strike   TREATMENT: [facility] Adult PT Treatment:                                                DATE: 12/26/2022 Therapeutic Exercise: Nustep level 5 x 5 mins Slant board gastroc stretch 2x45" Standing heel/toe raises 2x10 Rocker board PF/DF x 60" Ankle DF/inv/ev 2x10 YTB Seated heel raises with ball 2x15  BAPs circles CW/CCW x10 each BIL Tandem stance 2x30"  [facility]                                                DATE: 12/24/2022 Therapeutic Exercise: Nustep level 5 x 5 mins Slant board gastroc stretch x2' Standing heel raises 3x10 Standing toe raises 2x10 Rocker board  PF/DF x2' Towel scrunches 2x1' BIL (pain R>L) BAPs PF/DF x15 BIL BAPs circles CW/CCW x10 each BIL Neuromuscular re-ed: FT stance x30" Semi tandem stance x30" BIL FT on Airex x30"  Semi tandem on Airex x30" BIL   [facility] Adult PT Treatment:                                                DATE: 10/11/2022 Therapeutic Exercise: Calf stretch with towel x 30" each Towel scrunch x 30" each Seated PF BTB x 10 each Seated plantar fascia mobilization x 30" R    PATIENT EDUCATION:  Education details: eval findings, FOTO, HEP, POC Person educated: Patient Education method: Explanation, Demonstration, and Handouts Education comprehension: verbalized understanding and returned demonstration   HOME EXERCISE PROGRAM: Access Code: [facility] URL:  [email or web address] Date: 12/11/2022 Prepared by: Octavio Manns   Exercises - Long Sitting Calf Stretch with Strap  - 2 x daily - 7 x weekly - 2-3 reps - 30 sec hold - Towel Scrunches  - 2 x daily - 7 x weekly - 2 reps - 60 sec hold - Seated Plantar Fascia Mobilization with Small Ball  - 2 x daily - 7 x weekly - 3 sets - 10 reps - Ankle and Toe Plantarflexion with Resistance  - 2 x daily - 7 x weekly - 3 sets - 10 reps - blue theraband hold   ASSESSMENT:   CLINICAL IMPRESSION: Pt able to complete all prescribed exercises with no adverse effect. Therapy focused on distal LE strengthening and balance. Pt is progressing with therapy, will continue per POC.     OBJECTIVE IMPAIRMENTS: Abnormal gait, decreased activity tolerance, decreased mobility, difficulty walking, decreased ROM, decreased strength, and pain.    ACTIVITY LIMITATIONS: standing, squatting, stairs, transfers, and locomotion level   PARTICIPATION LIMITATIONS: meal prep, cleaning, laundry, driving, community activity, and yard work   PERSONAL FACTORS: Fitness and 3+ comorbidities: Anxiety, Depression, DMII  are also affecting patient's functional outcome.      GOALS: Goals reviewed with patient? No   SHORT TERM GOALS: Target date: 01/01/2023   Pt will be compliant and knowledgeable with initial HEP for improved comfort and carryover Baseline: initial HEP given  Goal status: INITIAL   2.  Pt will self report bilateral heel pain no greater than 6/10 for improved comfort and functional ability Baseline: 10/10 at worst Goal status: INITIAL    LONG TERM GOALS: Target date: 02/05/2023   Pt will self report bilateral heel pain no greater than 3/10 for improved comfort and functional ability Baseline: 10/10 at worst Goal status: INITIAL    2.  Pt will improve FOTO function score to no less than 52% as proxy for functional improvement Baseline: 28% function Goal status: INITIAL    3.  Pt will improve Five  Time Sit to Stand to no greater than 15 seconds for improved balance and functional mobility Baseline: 22 seconds with pain Goal status: INITIAL   4.  Pt will improve bilateral ankle DF to no less than 5 for improved gait and decreased pain  Baseline: see chart Goal status: INITIAL   5.  Pt will improve bilateral SLS time to no less than 15 seconds for improved balance and ankle stability Baseline: 4" R  2" L - (L painful) Goal status: INITIAL     PLAN:   PT FREQUENCY: 2x/week  PT DURATION: 8 weeks   PLANNED INTERVENTIONS: Therapeutic exercises, Therapeutic activity, Neuromuscular re-education, Balance training, Gait training, Patient/Family education, Self Care, Joint mobilization, Aquatic Therapy, Dry Needling, Electrical stimulation, Cryotherapy, Moist heat, Manual therapy, and Re-evaluation   PLAN FOR NEXT SESSION: assess HEP response, ankle strengthening, calf stretching, TPDN?   Ward Chatters, PT 12/26/2022, 5:45 PM

## 2022-12-27 ENCOUNTER — Telehealth (INDEPENDENT_AMBULATORY_CARE_PROVIDER_SITE_OTHER): Payer: Commercial Managed Care - HMO | Admitting: Psychiatry

## 2022-12-27 ENCOUNTER — Encounter (HOSPITAL_COMMUNITY): Payer: Self-pay | Admitting: Psychiatry

## 2022-12-27 DIAGNOSIS — F333 Major depressive disorder, recurrent, severe with psychotic symptoms: Secondary | ICD-10-CM | POA: Insufficient documentation

## 2022-12-27 DIAGNOSIS — F5101 Primary insomnia: Secondary | ICD-10-CM | POA: Diagnosis not present

## 2022-12-27 DIAGNOSIS — F411 Generalized anxiety disorder: Secondary | ICD-10-CM | POA: Diagnosis not present

## 2022-12-27 MED ORDER — OLANZAPINE 5 MG PO TABS: 5.0000 mg | ORAL_TABLET | Freq: Every day | ORAL | 3 refills | Status: DC

## 2022-12-27 MED ORDER — DULOXETINE HCL 60 MG PO CPEP: 60.0000 mg | ORAL_CAPSULE | Freq: Every day | ORAL | 3 refills | Status: DC

## 2022-12-27 MED ORDER — HYDROXYZINE HCL 25 MG PO TABS: ORAL_TABLET | Freq: Three times a day (TID) | ORAL | 3 refills | Status: DC | PRN

## 2022-12-27 MED ORDER — ZOLPIDEM TARTRATE 5 MG PO TABS: 5.0000 mg | ORAL_TABLET | Freq: Every evening | ORAL | 1 refills | Status: DC | PRN

## 2022-12-27 MED ORDER — GABAPENTIN 300 MG PO CAPS: 300.0000 mg | ORAL_CAPSULE | Freq: Three times a day (TID) | ORAL | 3 refills | Status: DC

## 2022-12-27 NOTE — Progress Notes (Signed)
BH MD/PA/NP OP Progress Note Virtual Visit via Video Note  I connected with Whitney Benton on 12/27/22 at  9:00 AM EST by a video enabled telemedicine application and verified that I am speaking with the correct person using two identifiers.  Location: Patient: Home Provider: Clinic   I discussed the limitations of evaluation and management by telemedicine and the availability of in person appointments. The patient expressed understanding and agreed to proceed.  I provided 30 minutes of non-face-to-face time during this encounter.       12/27/2022 11:17 AM Whitney Benton  MRN:  [id number]  Chief Complaint: "I'm am stressed"  HPI: 52 year old female seen today for follow up psychiatric evaluation.  She has a psychiatric history of anxiety and depression.  She is currently managed on doxepin 25 mg nightly, Seroquel 150 mg nightly, hydroxyzine 50 mg nightly, gabapentin 300 mg 3 times daily, hydroxyzine 25 mg 3 times daily, Ambien 5 mg nightly, BuSpar 15 mg 3 times daily and Cymbalta 60 mg daily. Today she notes her medications are somewhat effective in managing her psychiatric condition.  Today was well-groomed, pleasant, cooperative, and engaged in conversation.  She informed Probation officer that she is stressed.  She notes that her sister who lives with her recently quit her job.  She also notes that her boyfriend is out of work.  Financially she notes that things are overwhelming.  She she informed Probation officer that her utility bills are past due.  Provider gave patient resources to [facility] social worker for utility assistance.  Patient was grateful for this resource.    Patient also notes that she has been without her Seroquel.  She informed Probation officer that her insurance would not fill it.  She reports that since her last visit her anxiety, depression, and psychosis has increased.  Provider conducted a GAD-7 and patient scored a 21, at her last visit she scored a 19.  Provider also conducted PHQ-9 and patient  scored 25, at her last visit she scored 18.  She endorses poor sleep and adequate appetite.  Today she endorses passive SI however denies wanting to harm herself.  She denies SI/HI/VH or paranoia.  She does note that she has auditory hallucinations noting that she hears chattering.  At times patient reports that she is irritable but denies other symptoms of mania.    Patient reports that she continues to have bodily pains.  She notes that gabapentin and Cymbalta are somewhat effective in managing her pain.  Patient denies recent illegal substance use.  At this time patient does not wish to restart Seroquel or doxepin.  She is agreeable to starting Zyprexa 5 mg nightly to help manage sleep and symptoms of psychosis.  She requested that all other medication remain the same.  No other concerns at this time.          . Visit Diagnosis:    ICD-10-CM   1. Primary insomnia  F51.01 zolpidem (AMBIEN) 5 MG tablet    2. Generalized anxiety disorder  F41.1 hydrOXYzine (ATARAX) 25 MG tablet    gabapentin (NEURONTIN) 300 MG capsule    DULoxetine (CYMBALTA) 60 MG capsule    3. Substance induced mood disorder (HCC)  F19.94 OLANZapine (ZYPREXA) 5 MG tablet    gabapentin (NEURONTIN) 300 MG capsule    DULoxetine (CYMBALTA) 60 MG capsule       Past Psychiatric History: anxiety and depression  Past Medical History:  Past Medical History:  Diagnosis Date   Allergy    Anxiety  Arthritis    Asthma    Depression    Diabetes mellitus without complication ([place])    GERD (gastroesophageal reflux disease)    Glaucoma suspect    Hyperlipidemia    Trichomonas infection     Past Surgical History:  Procedure Laterality Date   CESAREAN SECTION     UPPER GASTROINTESTINAL ENDOSCOPY     VAGINAL HYSTERECTOMY  12/23/2004   Fibroids, menorrhagia, benign pathology    Family Psychiatric History: Mother schizophrenia and bipolar disorder  Family History:  Family History  Problem Relation Age of Onset    Diabetes Mother    Mental illness Mother    Depression Mother    Hypertension Mother    Colon cancer Father 58   Hypertension Father    Diabetes Father    Colon cancer Paternal Grandmother    Esophageal cancer Neg Hx    Stomach cancer Neg Hx    Rectal cancer Neg Hx     Social History:  Social History   Socioeconomic History   Marital status: Significant Other    Spouse name: Not on file   Number of children: 3   Years of education: 14   Highest education level: Not on file  Occupational History   Not on file  Tobacco Use   Smoking status: Former    Packs/day: 0.25    Years: 8.00    Total pack years: 2.00    Types: Cigarettes    Quit date: 2021    Years since quitting: 3.0   Smokeless tobacco: Never  Vaping Use   Vaping Use: Never used  Substance and Sexual Activity   Alcohol use: Not Currently    Comment: occasional   Drug use: No   Sexual activity: Yes    Birth control/protection: Surgical  Other Topics Concern   Not on file  Social History Narrative   Patient lives at home with mother and father , one story    Patient has 3 children    Patient is single   Patient has 14 years of education    Patient is right handed    Caffeine none   Social Determinants of Health   Financial Resource Strain: Not on file  Food Insecurity: Food Insecurity Present (05/24/2022)   Hunger Vital Sign    Worried About Running Out of Food in the Last Year: Sometimes true    Ran Out of Food in the Last Year: Sometimes true  Transportation Needs: No Transportation Needs (01/30/2021)   PRAPARE - Hydrologist (Medical): No    Lack of Transportation (Non-Medical): No  Physical Activity: Not on file  Stress: Not on file  Social Connections: Not on file    Allergies:  Allergies  Allergen Reactions   Aspirin Hives   Oxycodone Nausea And Vomiting    Metabolic Disorder Labs: Lab Results  Component Value Date   HGBA1C 10.8 (A) 12/12/2022   MPG 246  07/25/2022   MPG 380.93 10/02/2019   No results found for: "PROLACTIN" Lab Results  Component Value Date   CHOL 177 11/30/2021   TRIG 181 (H) 11/30/2021   HDL 40 11/30/2021   CHOLHDL 4.4 11/30/2021   VLDL 19 11/14/2014   LDLCALC 105 (H) 11/30/2021   LDLCALC 92 02/04/2020   Lab Results  Component Value Date   TSH 2.460 04/30/2021   TSH 0.014 (L) 10/02/2019    Therapeutic Level Labs: No results found for: "LITHIUM" No results found for: "VALPROATE" No results found  for: "CBMZ"  Current Medications: Current Outpatient Medications  Medication Sig Dispense Refill   OLANZapine (ZYPREXA) 5 MG tablet Take 1 tablet (5 mg total) by mouth at bedtime. 30 tablet 3   acetaminophen (TYLENOL) 500 MG tablet Take 1 tablet (500 mg total) by mouth every 6 (six) hours as needed. 30 tablet 0   acetaminophen-codeine (TYLENOL #3) 300-30 MG tablet Take 1 tablet by mouth 2 (two) times daily as needed for moderate pain. 30 tablet 0   albuterol (VENTOLIN HFA) 108 (90 Base) MCG/ACT inhaler Inhale 2 puffs into the lungs every 6 (six) hours as needed for wheezing or shortness of breath (Cough). 18 g 0   atorvastatin (LIPITOR) 40 MG tablet Take 1 tablet (40 mg total) by mouth daily. 90 tablet 2   Blood Glucose Monitoring Suppl (TRUE METRIX METER) w/Device KIT Check blood sugars three times a day 1 kit 0   busPIRone (BUSPAR) 15 MG tablet Take 1 tablet (15 mg total) by mouth 3 (three) times daily. 90 tablet 3   Continuous Blood Gluc Receiver (FREESTYLE LIBRE 2 READER) DEVI Use to check blood sugar three times daily. 1 each 0   Continuous Blood Gluc Sensor (FREESTYLE LIBRE 2 SENSOR) MISC Use to check blood sugar three times daily. 2 each 3   Continuous Blood Gluc Sensor (FREESTYLE LIBRE 2 SENSOR) MISC take as directed 1 each 0   cycloSPORINE (RESTASIS) 0.05 % ophthalmic emulsion Apply 1 drop into both eyes twice a day 180 each 3   doxepin (SINEQUAN) 25 MG capsule Take 1 capsule (25 mg total) by mouth at bedtime.  30 capsule 3   DULoxetine (CYMBALTA) 20 MG capsule Take 1 capsule (20 mg total) by mouth daily. 30 capsule 3   DULoxetine (CYMBALTA) 60 MG capsule Take 1 capsule (60 mg total) by mouth daily. 30 capsule 3   Erenumab-aooe (AIMOVIG) 140 MG/ML SOAJ Inject 140 mg into the skin every 28 (twenty-eight) days. 1.12 mL 5   Estradiol 10 MCG TABS vaginal tablet Insert one tab intravaginally 2 times a week 8 tablet 4   fexofenadine (ALLEGRA) 180 MG tablet Take 1 tablet (180 mg total) by mouth daily. (Patient not taking: Reported on 10/17/2022) 90 tablet 1   fluticasone (FLONASE) 50 MCG/ACT nasal spray Place 1 spray into both nostrils daily. Begin by using 2 sprays in each nare daily for 3 to 5 days, then decrease to 1 spray in each nare daily. 32 mL 1   gabapentin (NEURONTIN) 300 MG capsule Take 1 capsule (300 mg total) by mouth 3 (three) times daily. 90 capsule 3   glucose blood (TRUE METRIX BLOOD GLUCOSE TEST) test strip Use to check blood sugar 3 times daily 100 each 2   hydrOXYzine (ATARAX) 25 MG tablet TAKE 1 TABLET (25 MG TOTAL) BY MOUTH 3 (THREE) TIMES DAILY AS NEEDED. 90 tablet 3   insulin aspart (NOVOLOG FLEXPEN) 100 UNIT/ML FlexPen INJECT 24 UNITS BEFORE BREAKFAST, 24 UNITS BEFORE LUNCH, AND 24 UNITS BEFORE DINNER. 15 mL 2   insulin aspart (NOVOLOG) 100 UNIT/ML FlexPen Inject 24 Units into the skin daily before breakfast AND 24 Units daily before lunch AND 24 Units daily before supper. 15 mL 2   Insulin Glargine (BASAGLAR KWIKPEN) 100 UNIT/ML Inject 70 Units into the skin in the morning. 30 mL 6   Insulin Glargine (BASAGLAR KWIKPEN) 100 UNIT/ML Inject 70 Units into the skin daily. 30 mL 6   Insulin Pen Needle (PEN NEEDLES) 31G X 6 MM MISC Use as directed  100 each 0   lidocaine (LIDODERM) 5 % Place 1 patch onto the skin daily. Remove & Discard patch within 12 hours or as directed by MD 30 patch 0   meloxicam (MOBIC) 15 MG tablet Take 1 tablet (15 mg total) by mouth daily. (Patient not taking: Reported  on 12/12/2022) 30 tablet 2   metFORMIN (GLUCOPHAGE-XR) 500 MG 24 hr tablet Take 1 tablet (500 mg total) by mouth 2 (two) times daily. 180 tablet 1   methocarbamol (ROBAXIN) 500 MG tablet Take 1 tablet (500 mg total) by mouth 3 (three) times daily as needed for muscle spasms. 20 tablet 2   metoprolol tartrate (LOPRESSOR) 25 MG tablet Take 0.5 tablets (12.5 mg total) by mouth 2 (two) times daily. 90 tablet 2   Multiple Vitamin (MULTIVITAMIN ADULT) TABS Take 1 tablet by mouth daily.     Multiple Vitamins-Minerals (EYE VITAMINS PO) Take 1 tablet by mouth daily.     omeprazole (PRILOSEC) 40 MG capsule Take 1 capsule (40 mg total) by mouth daily. Take 30 minutes prior to breakfast 30 capsule 3   ondansetron (ZOFRAN-ODT) 4 MG disintegrating tablet Take 1 tablet (4 mg total) by mouth every 8 (eight) hours as needed for nausea or vomiting. 20 tablet 0   SUMAtriptan (IMITREX) 100 MG tablet TAKE 1 TABLET EARLIEST ONSET OF MIGRAINE. MAY REPEAT IN 2 HOURS IF HEADACHE PERSISTS OR RECURS. MAXIMUM 2 TABLETS IN 24 HOURS. 10 tablet 5   triamcinolone cream (KENALOG) 0.1 % Apply 1 Application topically 2 (two) times daily. 30 g 0   TRUEPLUS LANCETS 28G MISC Check blood sugars three time a day 100 each 12   zolpidem (AMBIEN) 5 MG tablet Take 1 tablet (5 mg total) by mouth at bedtime as needed for sleep. 30 tablet 1   No current facility-administered medications for this visit.     Musculoskeletal: Strength & Muscle Tone: within normal limits, telehealth visit Gait & Station: normal, telehealth visit Patient leans: N/A  Psychiatric Specialty Exam: Review of Systems  There were no vitals taken for this visit.There is no height or weight on file to calculate BMI.  General Appearance: Well Groomed  Eye Contact:  Good  Speech:  Clear and Coherent  Volume:  Normal  Mood:  Anxious and Depressed  Affect:  Appropriate and Congruent  Thought Process:  Coherent, Goal Directed, and Linear  Orientation:  Full (Time,  Place, and Person)  Thought Content: WDL and Logical, AH  Suicidal Thoughts:  No  Homicidal Thoughts:  No  Memory:  Immediate;   Good Recent;   Good Remote;   Good  Judgement:  Good  Insight:  Good  Psychomotor Activity:  Normal  Concentration:  Concentration: Good and Attention Span: Good  Recall:  Good  Fund of Knowledge: Good  Language: Good  Akathisia:  No  Handed:  Right  AIMS (if indicated): not done  Assets:  Communication Skills Desire for Improvement Housing Intimacy Physical Health Social Support  ADL's:  Intact  Cognition: WNL  Sleep:  Fair   Screenings: GAD-7    Flowsheet Row Video Visit from 12/27/2022 in [facility] Office Visit from 12/12/2022 in [place] from 09/24/2022 in [facility] Video Visit from 06/24/2022 in [facility] Office Visit from 04/04/2022 in [place]  Total GAD-7 Score '21 21 19 20 21      '$ PHQ2-9    Flowsheet Row  Video Visit from 12/27/2022 in [facility] Office Visit from 12/12/2022 in [place] from 09/24/2022 in [facility] Video Visit from 06/24/2022 in [facility] from [facility]/24/2022 [facility]  PHQ-2 Total Score '6 6 5 6 '$ 0  PHQ-9 Total Score '25 21 18 20 '$ --      Flowsheet Row Video Visit from 12/27/2022 in [facility] ED from 10/06/2022 in [place] DEPT ED from 09/01/2022 in [facility] [place] No Risk No Risk        Assessment and Plan: Patient endorses symptoms of anxiety, depression, poor sleep,AH and pain.  At this time she does not wish to restart Seroquel or doxepin.  She is agreeable to  starting Zyprexa 5 mg nightly to help manage anxiety, depression, sleep, and psychosis.  She will continue all other medications as prescribed.   1. Primary insomnia  Continue- zolpidem (AMBIEN) 5 MG tablet; Take 1 tablet (5 mg total) by mouth at bedtime as needed for sleep.  Dispense: 30 tablet; Refill: 1  2. Generalized anxiety disorder  Continue- hydrOXYzine (ATARAX) 25 MG tablet; TAKE 1 TABLET (25 MG TOTAL) BY MOUTH 3 (THREE) TIMES DAILY AS NEEDED.  Dispense: 90 tablet; Refill: 3 Continue- gabapentin (NEURONTIN) 300 MG capsule; Take 1 capsule (300 mg total) by mouth 3 (three) times daily.  Dispense: 90 capsule; Refill: 3 Continue- DULoxetine (CYMBALTA) 60 MG capsule; Take 1 capsule (60 mg total) by mouth daily.  Dispense: 30 capsule; Refill: 3  3. Severe recurrent major depressive disorder with psychotic features ([place])  Start- OLANZapine (ZYPREXA) 5 MG tablet; Take 1 tablet (5 mg total) by mouth at bedtime.  Dispense: 30 tablet; Refill: 3 Continue- gabapentin (NEURONTIN) 300 MG capsule; Take 1 capsule (300 mg total) by mouth 3 (three) times daily.  Dispense: 90 capsule; Refill: 3 Continue- DULoxetine (CYMBALTA) 60 MG capsule; Take 1 capsule (60 mg total) by mouth daily.  Dispense: 30 capsule; Refill: 3      Follow-up in 3 months Follow-up with therapy   Salley Slaughter, NP 12/27/2022, 11:17 AM

## 2022-12-30 ENCOUNTER — Telehealth: Payer: Self-pay | Admitting: Internal Medicine

## 2022-12-30 DIAGNOSIS — E1165 Type 2 diabetes mellitus with hyperglycemia: Secondary | ICD-10-CM | POA: Diagnosis not present

## 2022-12-30 DIAGNOSIS — E785 Hyperlipidemia, unspecified: Secondary | ICD-10-CM | POA: Diagnosis not present

## 2022-12-30 NOTE — Telephone Encounter (Signed)
Copied from [place] [phone]. Topic: Appointment Scheduling - Scheduling Inquiry for Clinic >> Dec 30, 2022 12:39 PM Erskine Squibb wrote: Reason for CRM: The patient called in stating she is not having her CT scan until Jan 31st as of now. She is scheduled to see her provider on Jan 25th as she was hoping to have her CT before then to go over results. Please assist patient further to see if she still needs to come in or if provider wants her to have the scan sooner.

## 2022-12-31 ENCOUNTER — Ambulatory Visit: Payer: Commercial Managed Care - HMO

## 2022-12-31 NOTE — Therapy (Incomplete)
OUTPATIENT PHYSICAL THERAPY TREATMENT NOTE   Patient Name: Whitney Benton MRN: [id number] DOB:May 20, 1971, 52 y.o., female Today's Date: 12/31/2022  PCP: Ladell Pier, MD  REFERRING PROVIDER: Yevonne Pax, DPM   END OF SESSION:      Past Medical History:  Diagnosis Date   Allergy    Anxiety    Arthritis    Asthma    Depression    Diabetes mellitus without complication ([place])    GERD (gastroesophageal reflux disease)    Glaucoma suspect    Hyperlipidemia    Trichomonas infection    Past Surgical History:  Procedure Laterality Date   CESAREAN SECTION     UPPER GASTROINTESTINAL ENDOSCOPY     VAGINAL HYSTERECTOMY  12/23/2004   Fibroids, menorrhagia, benign pathology   Patient Active Problem List   Diagnosis Date Noted   Severe recurrent major depressive disorder with psychotic features ([place]) 12/27/2022   Primary insomnia 12/27/2022   Morbid obesity ([place]) 04/04/2022   Former smoker 04/30/2021   Major depressive disorder, recurrent episode, moderate ([place]) 04/30/2021   Substance induced mood disorder ([place]) 03/22/2021   Generalized anxiety disorder 03/22/2021   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   DKA (diabetic ketoacidosis) ([place]) 01/23/2021   Glaucoma suspect 04/12/2020   DKA, type 2, not at goal [facility]) 10/02/2019   AKI (acute kidney injury) ([place]) 10/02/2019   Hyperkalemia 10/02/2019   Leukocytosis 10/02/2019   Abnormal LFTs 10/02/2019   Stressful life events affecting family and household 08/06/2019   New onset type 2 diabetes mellitus ([place]) 02/04/2019   Obesity (BMI 35.0-39.9 without comorbidity) 02/04/2019   Tobacco dependence 02/04/2019   Left arm weakness 12/06/2014   Paresthesias/numbness 12/06/2014   Tobacco abuse 11/14/2014   Depression    Mixed incontinence 06/27/2014   History of TVH in 2006 for fibroids and menorrhagia; benign pathology 09/08/2011    REFERRING DIAG: M72.2 (ICD-10-CM) - Plantar fasciitis, bilateral     THERAPY DIAG:  No diagnosis found.  Rationale for Evaluation and Treatment Rehabilitation  PERTINENT HISTORY: Anxiety, Depression, DMII   PRECAUTIONS: None  SUBJECTIVE:                                                                                                                                                                                      SUBJECTIVE STATEMENT:  *** Pt presents to PT with continued reports of bilateral heel pain. Has been compliant with HEP with no adverse effect. She is ready to begin PT at this time.   PAIN:  Are you having pain?  Yes: NPRS scale: ***8.5/10 Worst: 10/10 Pain location: bilateral heels Pain description: sharp Aggravating factors: stairs, first step out  of bed Relieving factors: none   OBJECTIVE: (objective measures completed at initial evaluation unless otherwise dated)   DIAGNOSTIC FINDINGS:             See imaging    PATIENT SURVEYS:  FOTO: 28% function; 52% predicted   COGNITION: Overall cognitive status: Within functional limits for tasks assessed                         SENSATION: Not tested   POSTURE: rounded shoulders, forward head, and larger body habitus   PALPATION: TTP to bilateral heels, bilateral gastroc   LOWER EXTREMITY ROM:   Active ROM Right eval Left eval  Hip flexion      Hip extension      Hip abduction      Hip adduction      Hip internal rotation      Hip external rotation      Knee flexion      Knee extension      Ankle dorsiflexion -5 -10  Ankle plantarflexion      Ankle inversion      Ankle eversion       (Blank rows = not tested)   LOWER EXTREMITY MMT:   MMT Right eval Left eval  Hip flexion      Hip extension      Hip abduction      Hip adduction      Hip internal rotation      Hip external rotation      Knee flexion      Knee extension      Ankle dorsiflexion      Ankle plantarflexion 4/5 3+/5 p!  Ankle inversion      Ankle eversion       (Blank rows = not  tested)   LOWER EXTREMITY SPECIAL TESTS:  DNT   FUNCTIONAL TESTS:  Five Time Sit to Stand: 22 seconds SLS: 4" R  2" L - (L painful)   GAIT: Distance walked: 80f Assistive device utilized: None Level of assistance: Complete Independence Comments: antalgica gait L, decreased heel strike   TREATMENT: [facility] Adult PT Treatment:                                                DATE: 12/31/2022 Therapeutic Exercise: Nustep level 5 x 5 mins Slant board gastroc stretch x2' Standing heel/toe raises 2x10 Rocker board PF/DF x 60" Ankle DF/inv/ev 2x10 YTB Seated heel raises with ball 2x15  BAPs circles CW/CCW x10 each BIL Tandem stance 2x30" Neuromuscular re-ed: FT stance x30" Semi tandem stance x30" BIL FT on Airex x30"  Semi tandem on Airex x30" BIL  [facility] Adult PT Treatment:                                                DATE: 12/26/2022 Therapeutic Exercise: Nustep level 5 x 5 mins Slant board gastroc stretch 2x45" Standing heel/toe raises 2x10 Rocker board PF/DF x 60" Ankle DF/inv/ev 2x10 YTB Seated heel raises with ball 2x15  BAPs circles CW/CCW x10 each BIL Tandem stance 2x30"  [facility] PT Treatment:  DATE: 12/24/2022 Therapeutic Exercise: Nustep level 5 x 5 mins Slant board gastroc stretch x2' Standing heel raises 3x10 Standing toe raises 2x10 Rocker board PF/DF x2' Towel scrunches 2x1' BIL (pain R>L) BAPs PF/DF x15 BIL BAPs circles CW/CCW x10 each BIL Neuromuscular re-ed: FT stance x30" Semi tandem stance x30" BIL FT on Airex x30"  Semi tandem on Airex x30" BIL   PATIENT EDUCATION:  Education details: eval findings, FOTO, HEP, POC Person educated: Patient Education method: Explanation, Demonstration, and Handouts Education comprehension: verbalized understanding and returned demonstration   HOME EXERCISE PROGRAM: Access Code: [facility] URL: [email or web address] Date: 12/11/2022 Prepared by: Octavio Manns   Exercises - Long Sitting Calf Stretch with Strap  - 2 x daily - 7 x weekly - 2-3 reps - 30 sec hold - Towel Scrunches  - 2 x daily - 7 x weekly - 2 reps - 60 sec hold - Seated Plantar Fascia Mobilization with Small Ball  - 2 x daily - 7 x weekly - 3 sets - 10 reps - Ankle and Toe Plantarflexion with Resistance  - 2 x daily - 7 x weekly - 3 sets - 10 reps - blue theraband hold   ASSESSMENT:   CLINICAL IMPRESSION: Pt able to complete all prescribed exercises with no adverse effect. Therapy focused on distal LE strengthening and balance. Pt is progressing with therapy, will continue per POC.     OBJECTIVE IMPAIRMENTS: Abnormal gait, decreased activity tolerance, decreased mobility, difficulty walking, decreased ROM, decreased strength, and pain.    ACTIVITY LIMITATIONS: standing, squatting, stairs, transfers, and locomotion level   PARTICIPATION LIMITATIONS: meal prep, cleaning, laundry, driving, community activity, and yard work   PERSONAL FACTORS: Fitness and 3+ comorbidities: Anxiety, Depression, DMII  are also affecting patient's functional outcome.      GOALS: Goals reviewed with patient? No   SHORT TERM GOALS: Target date: 01/01/2023   Pt will be compliant and knowledgeable with initial HEP for improved comfort and carryover Baseline: initial HEP given  Goal status: INITIAL   2.  Pt will self report bilateral heel pain no greater than 6/10 for improved comfort and functional ability Baseline: 10/10 at worst Goal status: INITIAL    LONG TERM GOALS: Target date: 02/05/2023   Pt will self report bilateral heel pain no greater than 3/10 for improved comfort and functional ability Baseline: 10/10 at worst Goal status: INITIAL    2.  Pt will improve FOTO function score to no less than 52% as proxy for functional improvement Baseline: 28% function Goal status: INITIAL    3.  Pt will improve Five Time Sit to Stand to no greater than 15 seconds for improved balance and  functional mobility Baseline: 22 seconds with pain Goal status: INITIAL   4.  Pt will improve bilateral ankle DF to no less than 5 for improved gait and decreased pain  Baseline: see chart Goal status: INITIAL   5.  Pt will improve bilateral SLS time to no less than 15 seconds for improved balance and ankle stability Baseline: 4" R  2" L - (L painful) Goal status: INITIAL     PLAN:   PT FREQUENCY: 2x/week   PT DURATION: 8 weeks   PLANNED INTERVENTIONS: Therapeutic exercises, Therapeutic activity, Neuromuscular re-education, Balance training, Gait training, Patient/Family education, Self Care, Joint mobilization, Aquatic Therapy, Dry Needling, Electrical stimulation, Cryotherapy, Moist heat, Manual therapy, and Re-evaluation   PLAN FOR NEXT SESSION: assess HEP response, ankle strengthening, calf stretching, TPDN?   Margarette Canada, PTA  12/31/2022, 11:06 AM

## 2023-01-01 DIAGNOSIS — E1165 Type 2 diabetes mellitus with hyperglycemia: Secondary | ICD-10-CM | POA: Diagnosis not present

## 2023-01-01 NOTE — Telephone Encounter (Signed)
Called & LVM to call back to rescheduled. Please reschedule patient for an appointment after 01/22/2023.

## 2023-01-01 NOTE — Progress Notes (Unsigned)
S:     PCP: Dr. Wynetta Emery  52 y.o. female who presents for diabetes evaluation, education, and management. PMH is significant for depression, migraines, mixed incontinence, DM, obesity, HLD, vit D def, former smoker. Patient was referred and last seen by Primary Care Provider on 12/12/2022.  Last seen by pharmacy team on 10/21/2022.    At visit with pharmacy team on 09/16/2022, occasional hypoglycemia was observed on CGM so Basaglar was reduced from 67 to 62 units daily and Novolog was reduced from 25 to 20 units TID. Additionally, concerns were brought up at the following CPP visit with intermittent generalized upper abdominal pain.   At last visit with PCP, she continued to report upper abdominal pain and nausea, leading to appetite reduction.  EGD revealed some superficial erosions along the body of the stomach consistent with NSAID induced pathology.  As a result, Trulicity was discontinued at this time and both Basaglar and Novolog doses were increased. A1c was increased at that visit to 10.8, previously 10.2.  Today, patient arrives in *** good spirits and presents without *** any assistance. ***  Patient reports Diabetes was diagnosed in ***.   Family/Social History:  -Fhx: HTN, DM, colon cancer -Tobacco: former (quit 2021) -Alcohol: denies  Current diabetes medications include: Novolog 24 units TID, Basaglar 70 units once daily, metformin '500mg'$  BID  Patient reports adherence to taking all medications as prescribed.  *** Patient denies adherence with medications, reports missing *** medications *** times per week, on average.  Insurance coverage: [employer]  Patient {Actions; denies-reports:120008} hypoglycemic events.  Reported home fasting blood sugars: ***  Reported 2 hour post-meal/random blood sugars: ***.  Patient {Actions; denies-reports:120008} nocturia (nighttime urination).  Patient {Actions; denies-reports:120008} neuropathy (nerve pain). Patient {Actions;  denies-reports:120008} visual changes. Patient {Actions; denies-reports:120008} self foot exams.   Patient reported dietary habits: Eats *** meals/day Breakfast: *** Lunch: *** Dinner: *** Snacks: *** Drinks: ***   Patient-reported exercise habits: ***   O:   ROS  Physical Exam  7 day average blood glucose: ***  *** CGM Download:  % Time CGM is active: ***% Average Glucose: *** mg/dL Glucose Management Indicator: ***  Glucose Variability: *** (goal <36%) Time in Goal:  - Time in range 70-180: ***% - Time above range: ***% - Time below range: ***% Observed patterns:   Lab Results  Component Value Date   HGBA1C 10.8 (A) 12/12/2022   There were no vitals filed for this visit.  Lipid Panel     Component Value Date/Time   CHOL 177 11/30/2021 1039   TRIG 181 (H) 11/30/2021 1039   HDL 40 11/30/2021 1039   CHOLHDL 4.4 11/30/2021 1039   CHOLHDL 3.2 11/14/2014 0707   VLDL 19 11/14/2014 0707   LDLCALC 105 (H) 11/30/2021 1039    Clinical Atherosclerotic Cardiovascular Disease (ASCVD): {YES/NO:21197} The 10-year ASCVD risk score (Arnett DK, et al., 2019) is: 4.4%   Values used to calculate the score:     Age: 24 years     Sex: Female     Is Non-Hispanic African American: Yes     Diabetic: Yes     Tobacco smoker: No     Systolic Blood Pressure: 026 mmHg     Is BP treated: No     HDL Cholesterol: 40 mg/dL     Total Cholesterol: 177 mg/dL   Patient is participating in a Managed Medicaid Plan:  {MM YES/NO:27447::"Yes"}   A/P: Diabetes longstanding *** currently ***. Patient is *** able to verbalize appropriate  hypoglycemia management plan. Medication adherence appears ***. Control is suboptimal due to ***. -{Meds adjust:18428} basal insulin *** (insulin ***). Patient will continue to titrate 1 unit every *** days if fasting blood sugar > '100mg'$ /dl until fasting blood sugars reach goal or next visit.  -{Meds adjust:18428} rapid insulin *** (insulin ***) to ***.   -{Meds adjust:18428} GLP-1 *** (generic ***) to ***.  -{Meds adjust:18428} SGLT2-I *** (generic ***) to ***. Counseled on sick day rules. -{Meds adjust:18428} metformin *** to ***.  -Patient educated on purpose, proper use, and potential adverse effects of ***.  -Extensively discussed pathophysiology of diabetes, recommended lifestyle interventions, dietary effects on blood sugar control.  -Counseled on s/sx of and management of hypoglycemia.  -Next A1c anticipated in March.    Written patient instructions provided. Patient verbalized understanding of treatment plan.  Total time in face to face counseling *** minutes.    Follow-up:  Pharmacist ***. PCP clinic visit in January  Maryan Puls, PharmD PGY-1 [facility] Pharmacy Resident

## 2023-01-02 ENCOUNTER — Other Ambulatory Visit: Payer: Self-pay

## 2023-01-02 ENCOUNTER — Ambulatory Visit: Payer: Commercial Managed Care - HMO | Attending: Internal Medicine | Admitting: Pharmacist

## 2023-01-02 ENCOUNTER — Ambulatory Visit: Payer: Commercial Managed Care - HMO

## 2023-01-02 DIAGNOSIS — Z794 Long term (current) use of insulin: Secondary | ICD-10-CM

## 2023-01-02 DIAGNOSIS — E1165 Type 2 diabetes mellitus with hyperglycemia: Secondary | ICD-10-CM

## 2023-01-02 MED ORDER — NOVOLOG FLEXPEN 100 UNIT/ML ~~LOC~~ SOPN
26.0000 [IU] | PEN_INJECTOR | Freq: Three times a day (TID) | SUBCUTANEOUS | 3 refills | Status: DC
  Filled 2023-01-02: qty 60, 76d supply, fill #0
  Filled 2023-01-13: qty 30, 38d supply, fill #0
  Filled 2023-02-12: qty 30, 38d supply, fill #1

## 2023-01-02 MED ORDER — BASAGLAR KWIKPEN 100 UNIT/ML ~~LOC~~ SOPN
40.0000 [IU] | PEN_INJECTOR | Freq: Two times a day (BID) | SUBCUTANEOUS | 3 refills | Status: DC
  Filled 2023-01-02 – 2023-01-13 (×2): qty 24, 30d supply, fill #0

## 2023-01-02 NOTE — Therapy (Incomplete)
OUTPATIENT PHYSICAL THERAPY TREATMENT NOTE   Patient Name: Whitney Benton MRN: [id number] DOB:30-Aug-1971, 52 y.o., female Today's Date: 01/02/2023  PCP: Ladell Pier, MD  REFERRING PROVIDER: Yevonne Pax, DPM   END OF SESSION:      Past Medical History:  Diagnosis Date   Allergy    Anxiety    Arthritis    Asthma    Depression    Diabetes mellitus without complication ([place])    GERD (gastroesophageal reflux disease)    Glaucoma suspect    Hyperlipidemia    Trichomonas infection    Past Surgical History:  Procedure Laterality Date   CESAREAN SECTION     UPPER GASTROINTESTINAL ENDOSCOPY     VAGINAL HYSTERECTOMY  12/23/2004   Fibroids, menorrhagia, benign pathology   Patient Active Problem List   Diagnosis Date Noted   Severe recurrent major depressive disorder with psychotic features ([place]) 12/27/2022   Primary insomnia 12/27/2022   Morbid obesity ([place]) 04/04/2022   Former smoker 04/30/2021   Major depressive disorder, recurrent episode, moderate ([place]) 04/30/2021   Substance induced mood disorder ([place]) 03/22/2021   Generalized anxiety disorder 03/22/2021   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   DKA (diabetic ketoacidosis) ([place]) 01/23/2021   Glaucoma suspect 04/12/2020   DKA, type 2, not at goal [facility]) 10/02/2019   AKI (acute kidney injury) ([place]) 10/02/2019   Hyperkalemia 10/02/2019   Leukocytosis 10/02/2019   Abnormal LFTs 10/02/2019   Stressful life events affecting family and household 08/06/2019   New onset type 2 diabetes mellitus ([place]) 02/04/2019   Obesity (BMI 35.0-39.9 without comorbidity) 02/04/2019   Tobacco dependence 02/04/2019   Left arm weakness 12/06/2014   Paresthesias/numbness 12/06/2014   Tobacco abuse 11/14/2014   Depression    Mixed incontinence 06/27/2014   History of TVH in 2006 for fibroids and menorrhagia; benign pathology 09/08/2011    REFERRING DIAG: M72.2 (ICD-10-CM) - Plantar fasciitis, bilateral     THERAPY DIAG:  No diagnosis found.  Rationale for Evaluation and Treatment Rehabilitation  PERTINENT HISTORY: Anxiety, Depression, DMII   PRECAUTIONS: None  SUBJECTIVE:                                                                                                                                                                                      SUBJECTIVE STATEMENT:  *** Pt presents to PT with continued reports of bilateral heel pain. Has been compliant with HEP with no adverse effect. She is ready to begin PT at this time.   PAIN:  Are you having pain?  Yes: NPRS scale: ***8.5/10 Worst: 10/10 Pain location: bilateral heels Pain description: sharp Aggravating factors: stairs, first step out  of bed Relieving factors: none   OBJECTIVE: (objective measures completed at initial evaluation unless otherwise dated)   DIAGNOSTIC FINDINGS:             See imaging    PATIENT SURVEYS:  FOTO: 28% function; 52% predicted   COGNITION: Overall cognitive status: Within functional limits for tasks assessed                         SENSATION: Not tested   POSTURE: rounded shoulders, forward head, and larger body habitus   PALPATION: TTP to bilateral heels, bilateral gastroc   LOWER EXTREMITY ROM:   Active ROM Right eval Left eval  Hip flexion      Hip extension      Hip abduction      Hip adduction      Hip internal rotation      Hip external rotation      Knee flexion      Knee extension      Ankle dorsiflexion -5 -10  Ankle plantarflexion      Ankle inversion      Ankle eversion       (Blank rows = not tested)   LOWER EXTREMITY MMT:   MMT Right eval Left eval  Hip flexion      Hip extension      Hip abduction      Hip adduction      Hip internal rotation      Hip external rotation      Knee flexion      Knee extension      Ankle dorsiflexion      Ankle plantarflexion 4/5 3+/5 p!  Ankle inversion      Ankle eversion       (Blank rows = not  tested)   LOWER EXTREMITY SPECIAL TESTS:  DNT   FUNCTIONAL TESTS:  Five Time Sit to Stand: 22 seconds SLS: 4" R  2" L - (L painful)   GAIT: Distance walked: 66f Assistive device utilized: None Level of assistance: Complete Independence Comments: antalgica gait L, decreased heel strike   TREATMENT: [facility] Adult PT Treatment:                                                DATE: 01/02/2023 Therapeutic Exercise: Nustep level 5 x 5 mins Slant board gastroc stretch x2' Standing heel/toe raises 2x10 Rocker board PF/DF x 60" Ankle DF/inv/ev 2x10 YTB Seated heel raises with ball 2x15  BAPs circles CW/CCW x10 each BIL Tandem stance 2x30" Neuromuscular re-ed: FT stance x30" Semi tandem stance x30" BIL FT on Airex x30"  Semi tandem on Airex x30" BIL  [facility] Adult PT Treatment:                                                DATE: 12/26/2022 Therapeutic Exercise: Nustep level 5 x 5 mins Slant board gastroc stretch 2x45" Standing heel/toe raises 2x10 Rocker board PF/DF x 60" Ankle DF/inv/ev 2x10 YTB Seated heel raises with ball 2x15  BAPs circles CW/CCW x10 each BIL Tandem stance 2x30"  [facility]  [facility]: 12[facility]24[facility]2022 Therapeutic Exercise: Nustep level 5 x 5 mins Slant board gastroc stretch x2' Standing heel raises 3x10 Standing toe raises 2x10 Rocker board PF/DF x2' Towel scrunches 2x1' BIL (pain R>L) BAPs PF/DF x15 BIL BAPs circles CW/CCW x10 each BIL Neuromuscular re-ed: FT stance x30" Semi tandem stance x30" BIL FT on Airex x30"  Semi tandem on Airex x30" BIL   PATIENT EDUCATION:  Education details: eval findings, FOTO, HEP, POC Person educated: Patient Education method: Explanation, Demonstration, and Handouts Education comprehension: verbalized understanding and returned demonstration   HOME EXERCISE PROGRAM: Access Code: [facility] URL: [email or web address] Date: 12/11/2022 Prepared by: Octavio Manns   Exercises - Long Sitting Calf Stretch with Strap  - 2 x daily - 7 x weekly - 2-3 reps - 30 sec hold - Towel Scrunches  - 2 x daily - 7 x weekly - 2 reps - 60 sec hold - Seated Plantar Fascia Mobilization with Small Ball  - 2 x daily - 7 x weekly - 3 sets - 10 reps - Ankle and Toe Plantarflexion with Resistance  - 2 x daily - 7 x weekly - 3 sets - 10 reps - blue theraband hold   ASSESSMENT:   CLINICAL IMPRESSION: ***  Pt able to complete all prescribed exercises with no adverse effect. Therapy focused on distal LE strengthening and balance. Pt is progressing with therapy, will continue per POC.     OBJECTIVE IMPAIRMENTS: Abnormal gait, decreased activity tolerance, decreased mobility, difficulty walking, decreased ROM, decreased strength, and pain.    ACTIVITY LIMITATIONS: standing, squatting, stairs, transfers, and locomotion level   PARTICIPATION LIMITATIONS: meal prep, cleaning, laundry, driving, community activity, and yard work   PERSONAL FACTORS: Fitness and 3+ comorbidities: Anxiety, Depression, DMII  are also affecting patient's functional outcome.      GOALS: Goals reviewed with patient? No   SHORT TERM GOALS: Target date: 01/01/2023   Pt will be compliant and knowledgeable with initial HEP for improved comfort and carryover Baseline: initial HEP given  Goal status: INITIAL   2.  Pt will self report bilateral heel pain no greater than 6/10 for improved comfort and functional ability Baseline: 10/10 at worst Goal status: INITIAL    LONG TERM GOALS: Target date: 02/05/2023   Pt will self report bilateral heel pain no greater than 3/10 for improved comfort and functional ability Baseline: 10/10 at worst Goal status: INITIAL    2.  Pt will improve FOTO function score to no less than 52% as proxy for functional improvement Baseline: 28% function Goal status: INITIAL    3.  Pt will improve Five Time Sit to Stand to no greater than 15 seconds for improved  balance and functional mobility Baseline: 22 seconds with pain Goal status: INITIAL   4.  Pt will improve bilateral ankle DF to no less than 5 for improved gait and decreased pain  Baseline: see chart Goal status: INITIAL   5.  Pt will improve bilateral SLS time to no less than 15 seconds for improved balance and ankle stability Baseline: 4" R  2" L - (L painful) Goal status: INITIAL     PLAN:   PT FREQUENCY: 2x/week   PT DURATION: 8 weeks   PLANNED INTERVENTIONS: Therapeutic exercises, Therapeutic activity, Neuromuscular re-education, Balance training, Gait training, Patient/Family education, Self Care, Joint mobilization, Aquatic Therapy, Dry Needling, Electrical stimulation, Cryotherapy, Moist heat, Manual therapy, and Re-evaluation   PLAN FOR NEXT SESSION: assess HEP response, ankle strengthening, calf stretching, TPDN?   Colletta [place]  Jimmye Norman, PTA 01/02/2023, 1:32 PM

## 2023-01-03 ENCOUNTER — Other Ambulatory Visit: Payer: Self-pay

## 2023-01-03 NOTE — Telephone Encounter (Signed)
Patient has appointment scheduled for 01/31/2023 at 9:10 does she needs to reschedule that appointment.

## 2023-01-06 ENCOUNTER — Other Ambulatory Visit: Payer: Self-pay

## 2023-01-06 MED ORDER — ATORVASTATIN CALCIUM 40 MG PO TABS: 40.0000 mg | ORAL_TABLET | Freq: Every day | ORAL | 2 refills | Status: DC

## 2023-01-07 ENCOUNTER — Other Ambulatory Visit: Payer: Self-pay

## 2023-01-07 ENCOUNTER — Telehealth: Payer: Self-pay | Admitting: Internal Medicine

## 2023-01-07 ENCOUNTER — Ambulatory Visit: Payer: Commercial Managed Care - HMO

## 2023-01-07 ENCOUNTER — Ambulatory Visit (INDEPENDENT_AMBULATORY_CARE_PROVIDER_SITE_OTHER): Payer: Commercial Managed Care - HMO | Admitting: Clinical

## 2023-01-07 DIAGNOSIS — M79672 Pain in left foot: Secondary | ICD-10-CM

## 2023-01-07 DIAGNOSIS — M6281 Muscle weakness (generalized): Secondary | ICD-10-CM

## 2023-01-07 DIAGNOSIS — F331 Major depressive disorder, recurrent, moderate: Secondary | ICD-10-CM | POA: Diagnosis not present

## 2023-01-07 DIAGNOSIS — R2689 Other abnormalities of gait and mobility: Secondary | ICD-10-CM

## 2023-01-07 DIAGNOSIS — M79671 Pain in right foot: Secondary | ICD-10-CM

## 2023-01-07 NOTE — Telephone Encounter (Signed)
FYI

## 2023-01-07 NOTE — Progress Notes (Signed)
   THERAPIST PROGRESS NOTE  Session Time: 40 minutes  Participation Level: Active  Behavioral Response: CasualAlertAnxious  Type of Therapy: Individual Therapy  Treatment Goals addressed: client will complete 80% of assigned homework  ProgressTowards Goals: Progressing  Interventions: CBT and Supportive  Summary:  Whitney Benton is a 52 y.o. female who presents for the scheduled appointment oriented times five, appropriately dressed, and friendly. Client denied hallucinations and delusions. Client reported she has been stressed. Client reported since the beginning of the year she put her foot down about her expectations of keeping up with the house. Client reported she had everyone to help her straighten the house up. Client reported because her boyfriend was not able to help her with paying her bills she will be going to social services to ask for assistance. Client reported she is having her sister to do the same thing so they can have the gas back in the home. Client reported it is cold in the home. Client reported her sister is looking for a job so she can move out sooner than later. Client reported stressors as such has made it hard for her to "let her parents go" as she deal with grief because they would always be present to help her with things she needed. Client reported it has bothered her that she has not gone to her mothers grave site since they put her head stone on it. Client reported because a lot has been going on she has not been able to go and sit and talk which usually helps her to get things off her chest. Client reported also her medications have increased including her insulin which has been upsetting. Evidence of progress towards goal:  client reported using problem solving skills and assertive communication at least 3x per week.   Suicidal/Homicidal: Nowithout intent/plan  Therapist Response:  Therapist began the appointment asking the client how she has been  doing since last seen. Therapist used CBT to engage using active listening and positive emotional support. Therapist used CBT to have the client discuss her thoughts on stressors and boundaries related to family and interpersonal relationships that could help. Therapist used CBT to reinforce taking steps to resolve and not making decisions while upset. Therapist used CBT to reinforce positive grieving mechanisms. Therapist used CBT ask the client to identify her progress with frequency of use with coping skills with continued practice in her daily activity.    Therapist assigned the client homework to practice self care.    Plan: Return again in 3 weeks.  Diagnosis: MDD, recurrent episode, moderate  Collaboration of Care: Other none requested  Patient/Guardian was advised Release of Information must be obtained prior to any record release in order to collaborate their care with an outside provider. Patient/Guardian was advised if they have not already done so to contact the registration department to sign all necessary forms in order for [place] to release information regarding their care.   Consent: Patient/Guardian gives verbal consent for treatment and assignment of benefits for services provided during this visit. Patient/Guardian expressed understanding and agreed to proceed.   [place], LCSW 01/07/2023

## 2023-01-07 NOTE — Telephone Encounter (Signed)
Prior auth's not needed. Refill too soon on all 3. Whitney Benton is currently ready for pickup at [facility] on [address].

## 2023-01-07 NOTE — Therapy (Signed)
OUTPATIENT PHYSICAL THERAPY TREATMENT NOTE   Patient Name: Whitney Benton MRN: [id number] DOB:01/30/1971, 52 y.o., female Today's Date: 01/07/2023  PCP: Ladell Pier, MD  REFERRING PROVIDER: Yevonne Pax, DPM   END OF SESSION:   PT End of Session - 01/07/23 1730     Visit Number 4    Number of Visits 17    Date for PT Re-Evaluation 02/05/23    Authorization Type [employer]    PT Start Time 1740    PT Stop Time 1818    PT Time Calculation (min) 38 min    Activity Tolerance Patient tolerated treatment well    Behavior During Therapy WFL for tasks assessed/performed               Past Medical History:  Diagnosis Date   Allergy    Anxiety    Arthritis    Asthma    Depression    Diabetes mellitus without complication ([place])    GERD (gastroesophageal reflux disease)    Glaucoma suspect    Hyperlipidemia    Trichomonas infection    Past Surgical History:  Procedure Laterality Date   CESAREAN SECTION     UPPER GASTROINTESTINAL ENDOSCOPY     VAGINAL HYSTERECTOMY  12/23/2004   Fibroids, menorrhagia, benign pathology   Patient Active Problem List   Diagnosis Date Noted   Severe recurrent major depressive disorder with psychotic features ([place]) 12/27/2022   Primary insomnia 12/27/2022   Morbid obesity ([place]) 04/04/2022   Former smoker 04/30/2021   Major depressive disorder, recurrent episode, moderate ([place]) 04/30/2021   Substance induced mood disorder ([place]) 03/22/2021   Generalized anxiety disorder 03/22/2021   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   DKA (diabetic ketoacidosis) ([place]) 01/23/2021   Glaucoma suspect 04/12/2020   DKA, type 2, not at goal [facility]) 10/02/2019   AKI (acute kidney injury) ([place]) 10/02/2019   Hyperkalemia 10/02/2019   Leukocytosis 10/02/2019   Abnormal LFTs 10/02/2019   Stressful life events affecting family and household 08/06/2019   New onset type 2 diabetes mellitus ([place]) 02/04/2019   Obesity (BMI 35.0-39.9  without comorbidity) 02/04/2019   Tobacco dependence 02/04/2019   Left arm weakness 12/06/2014   Paresthesias/numbness 12/06/2014   Tobacco abuse 11/14/2014   Depression    Mixed incontinence 06/27/2014   History of TVH in 2006 for fibroids and menorrhagia; benign pathology 09/08/2011    REFERRING DIAG: M72.2 (ICD-10-CM) - Plantar fasciitis, bilateral    THERAPY DIAG:  Pain in right foot  Pain in left foot  Muscle weakness (generalized)  Other abnormalities of gait and mobility  Rationale for Evaluation and Treatment Rehabilitation  PERTINENT HISTORY: Anxiety, Depression, DMII   PRECAUTIONS: None  SUBJECTIVE:  SUBJECTIVE STATEMENT:   Patient reports continued heel pain. HEP non-compliance.   PAIN:  Are you having pain?  Yes: NPRS scale: 7/10 Worst: 10/10 Pain location: bilateral heels Pain description: sharp Aggravating factors: stairs, first step out of bed Relieving factors: none   OBJECTIVE: (objective measures completed at initial evaluation unless otherwise dated)   DIAGNOSTIC FINDINGS:             See imaging    PATIENT SURVEYS:  FOTO: 28% function; 52% predicted   COGNITION: Overall cognitive status: Within functional limits for tasks assessed                         SENSATION: Not tested   POSTURE: rounded shoulders, forward head, and larger body habitus   PALPATION: TTP to bilateral heels, bilateral gastroc   LOWER EXTREMITY ROM:   Active ROM Right eval Left eval  Hip flexion      Hip extension      Hip abduction      Hip adduction      Hip internal rotation      Hip external rotation      Knee flexion      Knee extension      Ankle dorsiflexion -5 -10  Ankle plantarflexion      Ankle inversion      Ankle eversion       (Blank rows = not tested)    LOWER EXTREMITY MMT:   MMT Right eval Left eval  Hip flexion      Hip extension      Hip abduction      Hip adduction      Hip internal rotation      Hip external rotation      Knee flexion      Knee extension      Ankle dorsiflexion      Ankle plantarflexion 4/5 3+/5 p!  Ankle inversion      Ankle eversion       (Blank rows = not tested)   LOWER EXTREMITY SPECIAL TESTS:  DNT   FUNCTIONAL TESTS:  Five Time Sit to Stand: 22 seconds SLS: 4" R  2" L - (L painful)   GAIT: Distance walked: 75f Assistive device utilized: None Level of assistance: Complete Independence Comments: antalgica gait L, decreased heel strike   TREATMENT: [facility] Adult PT Treatment:                                                DATE: 01/07/2023 Therapeutic Exercise: Nustep level 6 x 5 mins Slant board gastroc stretch x2' Standing heel/toe raises 2x10 using wall Rocker board PF/DF x 60" Ankle DF/inv/ev 2x10 RTB Seated heel raises with ball 2x15  BAPs circles CW/CCW x10 each BIL Neuromuscular re-ed: FT on Airex x30"  Semi tandem on Airex x30" BIL Semi tandem x30" BIL  [facility] Adult PT Treatment:                                                DATE: 12/26/2022 Therapeutic Exercise: Nustep level 5 x 5 mins Slant board gastroc stretch 2x45" Standing heel/toe raises 2x10 Rocker board PF/DF x 60" Ankle DF/inv/ev 2x10 YTB Seated heel  raises with ball 2x15  BAPs circles CW/CCW x10 each BIL Tandem stance 2x30"  [facility] Adult PT Treatment:                                                DATE: 12/24/2022 Therapeutic Exercise: Nustep level 5 x 5 mins Slant board gastroc stretch x2' Standing heel raises 3x10 Standing toe raises 2x10 Rocker board PF/DF x2' Towel scrunches 2x1' BIL (pain R>L) BAPs PF/DF x15 BIL BAPs circles CW/CCW x10 each BIL Neuromuscular re-ed: FT stance x30" Semi tandem stance x30" BIL FT on Airex x30"  Semi tandem on Airex x30" BIL   PATIENT EDUCATION:  Education details: eval  findings, FOTO, HEP, POC Person educated: Patient Education method: Explanation, Demonstration, and Handouts Education comprehension: verbalized understanding and returned demonstration   HOME EXERCISE PROGRAM: Access Code: [facility] URL: [email or web address] Date: 12/11/2022 Prepared by: Octavio Manns   Exercises - Long Sitting Calf Stretch with Strap  - 2 x daily - 7 x weekly - 2-3 reps - 30 sec hold - Towel Scrunches  - 2 x daily - 7 x weekly - 2 reps - 60 sec hold - Seated Plantar Fascia Mobilization with Small Ball  - 2 x daily - 7 x weekly - 3 sets - 10 reps - Ankle and Toe Plantarflexion with Resistance  - 2 x daily - 7 x weekly - 3 sets - 10 reps - blue theraband hold   ASSESSMENT:   CLINICAL IMPRESSION: Patient presents to PT with continued reports of high level pain in BIL feet and states she has not been compliant with her HEP and that when her pain gets really high she walks on tiptoes. Advised patient to not walk in plantarflexion when her pain is elevated. Session today continued to focus on ankle strengthening and stretching as well as balance tasks. She has the most difficulty with semi-tandem stance on the Airexex, needing occasional UE support to maintain balance. Patient continues to benefit from skilled PT services and should be progressed as able to improve functional independence.     OBJECTIVE IMPAIRMENTS: Abnormal gait, decreased activity tolerance, decreased mobility, difficulty walking, decreased ROM, decreased strength, and pain.    ACTIVITY LIMITATIONS: standing, squatting, stairs, transfers, and locomotion level   PARTICIPATION LIMITATIONS: meal prep, cleaning, laundry, driving, community activity, and yard work   PERSONAL FACTORS: Fitness and 3+ comorbidities: Anxiety, Depression, DMII  are also affecting patient's functional outcome.      GOALS: Goals reviewed with patient? No   SHORT TERM GOALS: Target date: 01/01/2023   Pt will be  compliant and knowledgeable with initial HEP for improved comfort and carryover Baseline: initial HEP given  Goal status: Ongoing Patient reports sporadic compliance 01/07/23   2.  Pt will self report bilateral heel pain no greater than 6/10 for improved comfort and functional ability Baseline: 10/10 at worst Goal status: Ongoing Pt reports 10/10 at worst 01/07/23   LONG TERM GOALS: Target date: 02/05/2023   Pt will self report bilateral heel pain no greater than 3/10 for improved comfort and functional ability Baseline: 10/10 at worst Goal status: INITIAL    2.  Pt will improve FOTO function score to no less than 52% as proxy for functional improvement Baseline: 28% function Goal status: INITIAL    3.  Pt will improve Five Time Sit to Stand to no greater  than 15 seconds for improved balance and functional mobility Baseline: 22 seconds with pain Goal status: INITIAL   4.  Pt will improve bilateral ankle DF to no less than 5 for improved gait and decreased pain  Baseline: see chart Goal status: INITIAL   5.  Pt will improve bilateral SLS time to no less than 15 seconds for improved balance and ankle stability Baseline: 4" R  2" L - (L painful) Goal status: INITIAL     PLAN:   PT FREQUENCY: 2x/week   PT DURATION: 8 weeks   PLANNED INTERVENTIONS: Therapeutic exercises, Therapeutic activity, Neuromuscular re-education, Balance training, Gait training, Patient/Family education, Self Care, Joint mobilization, Aquatic Therapy, Dry Needling, Electrical stimulation, Cryotherapy, Moist heat, Manual therapy, and Re-evaluation   PLAN FOR NEXT SESSION: assess HEP response, ankle strengthening, calf stretching, TPDN?   Margarette Canada, PTA 01/07/2023, 6:18 PM

## 2023-01-07 NOTE — Telephone Encounter (Signed)
Pt needs PA for the 3 medications below/ Insulin Glargine (BASAGLAR KWIKPEN) 100 UNIT/ML at  [facility] pharmacy   atorvastatin (LIPITOR) 40 MG tablet needs PA at [facility]   metFORMIN (GLUCOPHAGE-XR) 500 MG 24 hr tablet  at [facility]   / please advise / office could use [email or web address]

## 2023-01-08 ENCOUNTER — Other Ambulatory Visit: Payer: Self-pay

## 2023-01-09 ENCOUNTER — Ambulatory Visit: Payer: Commercial Managed Care - HMO

## 2023-01-09 ENCOUNTER — Telehealth: Payer: Self-pay

## 2023-01-09 ENCOUNTER — Other Ambulatory Visit: Payer: Self-pay

## 2023-01-09 DIAGNOSIS — M79671 Pain in right foot: Secondary | ICD-10-CM

## 2023-01-09 DIAGNOSIS — M79672 Pain in left foot: Secondary | ICD-10-CM

## 2023-01-09 DIAGNOSIS — R2689 Other abnormalities of gait and mobility: Secondary | ICD-10-CM

## 2023-01-09 DIAGNOSIS — M6281 Muscle weakness (generalized): Secondary | ICD-10-CM

## 2023-01-09 NOTE — Telephone Encounter (Signed)
Noted  

## 2023-01-09 NOTE — Telephone Encounter (Signed)
Spoke to patient via phone and patient has a question regarding her [place]. She wanted to know why her copay went for '$0 to $25'. Informed patient that her copay in 2023 was $15 and copay as of 12/23/22 is $25-both billed to the same ins and she would have to contact her ins regarding copay questions as pharmacy and MD do not assign the copay amount.  $25 is the copay amount per ins as of 12/23/2022.  Patient stated she had ins on the other line and would get further information from them.

## 2023-01-09 NOTE — Therapy (Signed)
OUTPATIENT PHYSICAL THERAPY TREATMENT NOTE   Patient Name: Whitney Benton MRN: [id number] DOB:01/13/1971, 52 y.o., female Today's Date: 01/09/2023  PCP: Ladell Pier, MD  REFERRING PROVIDER: Yevonne Pax, DPM   END OF SESSION:   PT End of Session - 01/09/23 1654     Visit Number 5    Number of Visits 17    Date for PT Re-Evaluation 02/05/23    Authorization Type [employer]    PT Start Time 1655    PT Stop Time 5916    PT Time Calculation (min) 43 min    Activity Tolerance Patient tolerated treatment well    Behavior During Therapy WFL for tasks assessed/performed              Past Medical History:  Diagnosis Date   Allergy    Anxiety    Arthritis    Asthma    Depression    Diabetes mellitus without complication ([place])    GERD (gastroesophageal reflux disease)    Glaucoma suspect    Hyperlipidemia    Trichomonas infection    Past Surgical History:  Procedure Laterality Date   CESAREAN SECTION     UPPER GASTROINTESTINAL ENDOSCOPY     VAGINAL HYSTERECTOMY  12/23/2004   Fibroids, menorrhagia, benign pathology   Patient Active Problem List   Diagnosis Date Noted   Severe recurrent major depressive disorder with psychotic features ([place]) 12/27/2022   Primary insomnia 12/27/2022   Morbid obesity ([place]) 04/04/2022   Former smoker 04/30/2021   Major depressive disorder, recurrent episode, moderate ([place]) 04/30/2021   Substance induced mood disorder ([place]) 03/22/2021   Generalized anxiety disorder 03/22/2021   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   DKA (diabetic ketoacidosis) ([place]) 01/23/2021   Glaucoma suspect 04/12/2020   DKA, type 2, not at goal [facility]) 10/02/2019   AKI (acute kidney injury) ([place]) 10/02/2019   Hyperkalemia 10/02/2019   Leukocytosis 10/02/2019   Abnormal LFTs 10/02/2019   Stressful life events affecting family and household 08/06/2019   New onset type 2 diabetes mellitus ([place]) 02/04/2019   Obesity (BMI 35.0-39.9 without  comorbidity) 02/04/2019   Tobacco dependence 02/04/2019   Left arm weakness 12/06/2014   Paresthesias/numbness 12/06/2014   Tobacco abuse 11/14/2014   Depression    Mixed incontinence 06/27/2014   History of TVH in 2006 for fibroids and menorrhagia; benign pathology 09/08/2011    REFERRING DIAG: M72.2 (ICD-10-CM) - Plantar fasciitis, bilateral    THERAPY DIAG:  Pain in right foot  Pain in left foot  Muscle weakness (generalized)  Other abnormalities of gait and mobility  Rationale for Evaluation and Treatment Rehabilitation  PERTINENT HISTORY: Anxiety, Depression, DMII   PRECAUTIONS: None  SUBJECTIVE:  SUBJECTIVE STATEMENT:  Patient reports she has not been walking on her toes at home and has been doing her HEP.   PAIN:  Are you having pain?  Yes: NPRS scale: 6/10 Worst: 10/10 Pain location: bilateral heels Pain description: sharp Aggravating factors: stairs, first step out of bed Relieving factors: none   OBJECTIVE: (objective measures completed at initial evaluation unless otherwise dated)   DIAGNOSTIC FINDINGS:             See imaging    PATIENT SURVEYS:  FOTO: 28% function; 52% predicted   COGNITION: Overall cognitive status: Within functional limits for tasks assessed                         SENSATION: Not tested   POSTURE: rounded shoulders, forward head, and larger body habitus   PALPATION: TTP to bilateral heels, bilateral gastroc   LOWER EXTREMITY ROM:   Active ROM Right eval Left eval  Hip flexion      Hip extension      Hip abduction      Hip adduction      Hip internal rotation      Hip external rotation      Knee flexion      Knee extension      Ankle dorsiflexion -5 -10  Ankle plantarflexion      Ankle inversion      Ankle eversion       (Blank  rows = not tested)   LOWER EXTREMITY MMT:   MMT Right eval Left eval  Hip flexion      Hip extension      Hip abduction      Hip adduction      Hip internal rotation      Hip external rotation      Knee flexion      Knee extension      Ankle dorsiflexion      Ankle plantarflexion 4/5 3+/5 p!  Ankle inversion      Ankle eversion       (Blank rows = not tested)   LOWER EXTREMITY SPECIAL TESTS:  DNT   FUNCTIONAL TESTS:  Five Time Sit to Stand: 22 seconds SLS: 4" R  2" L - (L painful)   GAIT: Distance walked: 75f Assistive device utilized: None Level of assistance: Complete Independence Comments: antalgica gait L, decreased heel strike   TREATMENT: [facility] Adult PT Treatment:                                                DATE: 01/09/2023 Therapeutic Exercise: Nustep level 6 x 5 mins Slant board gastroc stretch x2' Slant board soleus stretch x2' Seated plantar fascia stretch x1' BIL Standing heel/toe raises 2x10 using wall Rocker board PF/DF x 60" Ankle DF/inv/ev x10 RTB BIL Seated heel raises with 10# KB on 2" step 2x15  Neuromuscular re-ed: FT on Airex x30"  Semi tandem on Airex x30" BIL Manual Therapy: STM/MTPR BIL gastroc Tiger tail BIL gastroc  [facility] Adult PT Treatment:                                                DATE: 01/07/2023 Therapeutic Exercise: Nustep  level 6 x 5 mins Slant board gastroc stretch x2' Standing heel/toe raises 2x10 using wall Rocker board PF/DF x 60" Ankle DF/inv/ev 2x10 RTB Seated heel raises with ball 2x15  BAPs circles CW/CCW x10 each BIL Neuromuscular re-ed: FT on Airex x30"  Semi tandem on Airex x30" BIL Semi tandem x30" BIL  [facility] Adult PT Treatment:                                                DATE: 12/26/2022 Therapeutic Exercise: Nustep level 5 x 5 mins Slant board gastroc stretch 2x45" Standing heel/toe raises 2x10 Rocker board PF/DF x 60" Ankle DF/inv/ev 2x10 YTB Seated heel raises with ball 2x15  BAPs circles  CW/CCW x10 each BIL Tandem stance 2x30"   PATIENT EDUCATION:  Education details: eval findings, FOTO, HEP, POC Person educated: Patient Education method: Explanation, Demonstration, and Handouts Education comprehension: verbalized understanding and returned demonstration   HOME EXERCISE PROGRAM: Access Code: [facility] URL: [email or web address] Date: 12/11/2022 Prepared by: Octavio Manns   Exercises - Long Sitting Calf Stretch with Strap  - 2 x daily - 7 x weekly - 2-3 reps - 30 sec hold - Towel Scrunches  - 2 x daily - 7 x weekly - 2 reps - 60 sec hold - Seated Plantar Fascia Mobilization with Small Ball  - 2 x daily - 7 x weekly - 3 sets - 10 reps - Ankle and Toe Plantarflexion with Resistance  - 2 x daily - 7 x weekly - 3 sets - 10 reps - blue theraband hold   ASSESSMENT:   CLINICAL IMPRESSION: Patient presents to PT with continued reports of pain in BIL feet, though she does note it is improving slightly. Session today continued to focus on BIL gastroc/soleus strengthening and stretching. Introduced manual techniques today to decrease tissue restriction in BIL gastroc with fair tolerance, she does report increased pain post. Multiple trigger points identified in BIL calves with fair tolerance for MTPR. Patient continues to benefit from skilled PT services and should be progressed as able to improve functional independence.     OBJECTIVE IMPAIRMENTS: Abnormal gait, decreased activity tolerance, decreased mobility, difficulty walking, decreased ROM, decreased strength, and pain.    ACTIVITY LIMITATIONS: standing, squatting, stairs, transfers, and locomotion level   PARTICIPATION LIMITATIONS: meal prep, cleaning, laundry, driving, community activity, and yard work   PERSONAL FACTORS: Fitness and 3+ comorbidities: Anxiety, Depression, DMII  are also affecting patient's functional outcome.      GOALS: Goals reviewed with patient? No   SHORT TERM GOALS: Target date:  01/01/2023   Pt will be compliant and knowledgeable with initial HEP for improved comfort and carryover Baseline: initial HEP given  Goal status: Ongoing Patient reports sporadic compliance 01/07/23   2.  Pt will self report bilateral heel pain no greater than 6/10 for improved comfort and functional ability Baseline: 10/10 at worst Goal status: Ongoing Pt reports 10/10 at worst 01/07/23   LONG TERM GOALS: Target date: 02/05/2023   Pt will self report bilateral heel pain no greater than 3/10 for improved comfort and functional ability Baseline: 10/10 at worst Goal status: INITIAL    2.  Pt will improve FOTO function score to no less than 52% as proxy for functional improvement Baseline: 28% function Goal status: INITIAL    3.  Pt will improve Five Time Sit to Stand  to no greater than 15 seconds for improved balance and functional mobility Baseline: 22 seconds with pain Goal status: INITIAL   4.  Pt will improve bilateral ankle DF to no less than 5 for improved gait and decreased pain  Baseline: see chart Goal status: INITIAL   5.  Pt will improve bilateral SLS time to no less than 15 seconds for improved balance and ankle stability Baseline: 4" R  2" L - (L painful) Goal status: INITIAL     PLAN:   PT FREQUENCY: 2x/week   PT DURATION: 8 weeks   PLANNED INTERVENTIONS: Therapeutic exercises, Therapeutic activity, Neuromuscular re-education, Balance training, Gait training, Patient/Family education, Self Care, Joint mobilization, Aquatic Therapy, Dry Needling, Electrical stimulation, Cryotherapy, Moist heat, Manual therapy, and Re-evaluation   PLAN FOR NEXT SESSION: assess HEP response, ankle strengthening, calf stretching, TPDN?   Margarette Canada, PTA 01/09/2023, 5:39 PM

## 2023-01-10 ENCOUNTER — Other Ambulatory Visit: Payer: Self-pay

## 2023-01-13 ENCOUNTER — Other Ambulatory Visit: Payer: Self-pay

## 2023-01-13 DIAGNOSIS — E785 Hyperlipidemia, unspecified: Secondary | ICD-10-CM | POA: Diagnosis not present

## 2023-01-13 DIAGNOSIS — E119 Type 2 diabetes mellitus without complications: Secondary | ICD-10-CM | POA: Diagnosis not present

## 2023-01-13 DIAGNOSIS — E139 Other specified diabetes mellitus without complications: Secondary | ICD-10-CM | POA: Diagnosis not present

## 2023-01-13 DIAGNOSIS — E1165 Type 2 diabetes mellitus with hyperglycemia: Secondary | ICD-10-CM | POA: Diagnosis not present

## 2023-01-14 ENCOUNTER — Ambulatory Visit: Payer: Commercial Managed Care - HMO

## 2023-01-14 DIAGNOSIS — R2689 Other abnormalities of gait and mobility: Secondary | ICD-10-CM

## 2023-01-14 DIAGNOSIS — M6281 Muscle weakness (generalized): Secondary | ICD-10-CM

## 2023-01-14 DIAGNOSIS — M79671 Pain in right foot: Secondary | ICD-10-CM | POA: Diagnosis not present

## 2023-01-14 DIAGNOSIS — M79672 Pain in left foot: Secondary | ICD-10-CM

## 2023-01-14 NOTE — Therapy (Signed)
OUTPATIENT PHYSICAL THERAPY TREATMENT NOTE   Patient Name: Whitney Benton MRN: [id number] DOB:04-Aug-1971, 52 y.o., female Today's Date: 01/14/2023  PCP: Ladell Pier, MD  REFERRING PROVIDER: Yevonne Pax, DPM   END OF SESSION:   PT End of Session - 01/14/23 1703     Visit Number 6    Number of Visits 17    Date for PT Re-Evaluation 02/05/23    Authorization Type [employer]    PT Start Time 1703    PT Stop Time 2751    PT Time Calculation (min) 40 min    Activity Tolerance Patient tolerated treatment well    Behavior During Therapy WFL for tasks assessed/performed               Past Medical History:  Diagnosis Date   Allergy    Anxiety    Arthritis    Asthma    Depression    Diabetes mellitus without complication ([place])    GERD (gastroesophageal reflux disease)    Glaucoma suspect    Hyperlipidemia    Trichomonas infection    Past Surgical History:  Procedure Laterality Date   CESAREAN SECTION     UPPER GASTROINTESTINAL ENDOSCOPY     VAGINAL HYSTERECTOMY  12/23/2004   Fibroids, menorrhagia, benign pathology   Patient Active Problem List   Diagnosis Date Noted   Severe recurrent major depressive disorder with psychotic features ([place]) 12/27/2022   Primary insomnia 12/27/2022   Morbid obesity ([place]) 04/04/2022   Former smoker 04/30/2021   Major depressive disorder, recurrent episode, moderate ([place]) 04/30/2021   Substance induced mood disorder ([place]) 03/22/2021   Generalized anxiety disorder 03/22/2021   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   DKA (diabetic ketoacidosis) ([place]) 01/23/2021   Glaucoma suspect 04/12/2020   DKA, type 2, not at goal [facility]) 10/02/2019   AKI (acute kidney injury) ([place]) 10/02/2019   Hyperkalemia 10/02/2019   Leukocytosis 10/02/2019   Abnormal LFTs 10/02/2019   Stressful life events affecting family and household 08/06/2019   New onset type 2 diabetes mellitus ([place]) 02/04/2019   Obesity (BMI 35.0-39.9  without comorbidity) 02/04/2019   Tobacco dependence 02/04/2019   Left arm weakness 12/06/2014   Paresthesias/numbness 12/06/2014   Tobacco abuse 11/14/2014   Depression    Mixed incontinence 06/27/2014   History of TVH in 2006 for fibroids and menorrhagia; benign pathology 09/08/2011    REFERRING DIAG: M72.2 (ICD-10-CM) - Plantar fasciitis, bilateral    THERAPY DIAG:  Pain in right foot  Pain in left foot  Muscle weakness (generalized)  Other abnormalities of gait and mobility  Rationale for Evaluation and Treatment Rehabilitation  PERTINENT HISTORY: Anxiety, Depression, DMII   PRECAUTIONS: None  SUBJECTIVE:  SUBJECTIVE STATEMENT:  Patient reports that her pain is higher today, her grandson kept her very busy today.   PAIN:  Are you having pain?  Yes: NPRS scale: 9/10 Worst: 10/10 Pain location: bilateral heels Pain description: sharp Aggravating factors: stairs, first step out of bed Relieving factors: none   OBJECTIVE: (objective measures completed at initial evaluation unless otherwise dated)   DIAGNOSTIC FINDINGS:             See imaging    PATIENT SURVEYS:  FOTO: 28% function; 52% predicted   COGNITION: Overall cognitive status: Within functional limits for tasks assessed                         SENSATION: Not tested   POSTURE: rounded shoulders, forward head, and larger body habitus   PALPATION: TTP to bilateral heels, bilateral gastroc   LOWER EXTREMITY ROM:   Active ROM Right eval Left eval  Hip flexion      Hip extension      Hip abduction      Hip adduction      Hip internal rotation      Hip external rotation      Knee flexion      Knee extension      Ankle dorsiflexion -5 -10  Ankle plantarflexion      Ankle inversion      Ankle eversion        (Blank rows = not tested)   LOWER EXTREMITY MMT:   MMT Right eval Left eval  Hip flexion      Hip extension      Hip abduction      Hip adduction      Hip internal rotation      Hip external rotation      Knee flexion      Knee extension      Ankle dorsiflexion      Ankle plantarflexion 4/5 3+/5 p!  Ankle inversion      Ankle eversion       (Blank rows = not tested)   LOWER EXTREMITY SPECIAL TESTS:  DNT   FUNCTIONAL TESTS:  Five Time Sit to Stand: 22 seconds SLS: 4" R  2" L - (L painful)   GAIT: Distance walked: 51f Assistive device utilized: None Level of assistance: Complete Independence Comments: antalgica gait L, decreased heel strike   TREATMENT: [facility] Adult PT Treatment:                                                DATE: 01/14/2023 Therapeutic Exercise: Nustep level 6 x 5 mins Slant board gastroc stretch x2' Slant board soleus stretch x2' Standing heel raises toes on Airex 2x10 Standing heel raises with tennis ball btw heels 2x10 Standing toe raises 2x10 using wall Rocker board PF/DF x 60" Seated heel raises with 15# KB on 2" step 2x10 Neuromuscular re-ed: FT on Airex x30"  FT on Airex with head turns x30" Semi tandem on Airex x30" BIL Manual Therapy: STM/MTPR BIL gastroc Tiger tail BIL gastroc  [facility] Adult PT Treatment:  DATE: 01/09/2023 Therapeutic Exercise: Nustep level 6 x 5 mins Slant board gastroc stretch x2' Slant board soleus stretch x2' Seated plantar fascia stretch x1' BIL Standing heel/toe raises 2x10 using wall Rocker board PF/DF x 60" Ankle DF/inv/ev x10 RTB BIL Seated heel raises with 10# KB on 2" step 2x15  Neuromuscular re-ed: FT on Airex x30"  Semi tandem on Airex x30" BIL Manual Therapy: STM/MTPR BIL gastroc Tiger tail BIL gastroc  [facility] Adult PT Treatment:                                                DATE: 01/07/2023 Therapeutic Exercise: Nustep level 6 x 5 mins Slant board  gastroc stretch x2' Standing heel/toe raises 2x10 using wall Rocker board PF/DF x 60" Ankle DF/inv/ev 2x10 RTB Seated heel raises with ball 2x15  BAPs circles CW/CCW x10 each BIL Neuromuscular re-ed: FT on Airex x30"  Semi tandem on Airex x30" BIL Semi tandem x30" BIL   PATIENT EDUCATION:  Education details: eval findings, FOTO, HEP, POC Person educated: Patient Education method: Explanation, Demonstration, and Handouts Education comprehension: verbalized understanding and returned demonstration   HOME EXERCISE PROGRAM: Access Code: [facility] URL: [email or web address] Date: 12/11/2022 Prepared by: Octavio Manns   Exercises - Long Sitting Calf Stretch with Strap  - 2 x daily - 7 x weekly - 2-3 reps - 30 sec hold - Towel Scrunches  - 2 x daily - 7 x weekly - 2 reps - 60 sec hold - Seated Plantar Fascia Mobilization with Small Ball  - 2 x daily - 7 x weekly - 3 sets - 10 reps - Ankle and Toe Plantarflexion with Resistance  - 2 x daily - 7 x weekly - 3 sets - 10 reps - blue theraband hold   ASSESSMENT:   CLINICAL IMPRESSION: Patient presents to PT with high levels of pain in BIL feet and reports HEP compliance. Session today continued to focus on BIL distal LE strengthening and stretching as well as balance training and manual techniques to decrease tissue restriction. Multiple trigger points identified in BIL calf muscles with fair tolerance for MTPR. Patient continues to benefit from skilled PT services and should be progressed as able to improve functional independence.     OBJECTIVE IMPAIRMENTS: Abnormal gait, decreased activity tolerance, decreased mobility, difficulty walking, decreased ROM, decreased strength, and pain.    ACTIVITY LIMITATIONS: standing, squatting, stairs, transfers, and locomotion level   PARTICIPATION LIMITATIONS: meal prep, cleaning, laundry, driving, community activity, and yard work   PERSONAL FACTORS: Fitness and 3+ comorbidities:  Anxiety, Depression, DMII  are also affecting patient's functional outcome.      GOALS: Goals reviewed with patient? No   SHORT TERM GOALS: Target date: 01/01/2023   Pt will be compliant and knowledgeable with initial HEP for improved comfort and carryover Baseline: initial HEP given  Goal status: Ongoing Patient reports sporadic compliance 01/07/23   2.  Pt will self report bilateral heel pain no greater than 6/10 for improved comfort and functional ability Baseline: 10/10 at worst Goal status: Ongoing Pt reports 10/10 at worst 01/07/23   LONG TERM GOALS: Target date: 02/05/2023   Pt will self report bilateral heel pain no greater than 3/10 for improved comfort and functional ability Baseline: 10/10 at worst Goal status: INITIAL    2.  Pt will improve FOTO function score to no less  than 52% as proxy for functional improvement Baseline: 28% function Goal status: INITIAL    3.  Pt will improve Five Time Sit to Stand to no greater than 15 seconds for improved balance and functional mobility Baseline: 22 seconds with pain Goal status: INITIAL   4.  Pt will improve bilateral ankle DF to no less than 5 for improved gait and decreased pain  Baseline: see chart Goal status: INITIAL   5.  Pt will improve bilateral SLS time to no less than 15 seconds for improved balance and ankle stability Baseline: 4" R  2" L - (L painful) Goal status: INITIAL     PLAN:   PT FREQUENCY: 2x/week   PT DURATION: 8 weeks   PLANNED INTERVENTIONS: Therapeutic exercises, Therapeutic activity, Neuromuscular re-education, Balance training, Gait training, Patient/Family education, Self Care, Joint mobilization, Aquatic Therapy, Dry Needling, Electrical stimulation, Cryotherapy, Moist heat, Manual therapy, and Re-evaluation   PLAN FOR NEXT SESSION: assess HEP response, ankle strengthening, calf stretching, TPDN?   Margarette Canada, PTA 01/14/2023, 5:41 PM

## 2023-01-15 ENCOUNTER — Other Ambulatory Visit: Payer: Self-pay | Admitting: Internal Medicine

## 2023-01-16 ENCOUNTER — Ambulatory Visit: Payer: Commercial Managed Care - HMO

## 2023-01-16 ENCOUNTER — Ambulatory Visit: Payer: Commercial Managed Care - HMO | Admitting: Internal Medicine

## 2023-01-16 DIAGNOSIS — M79672 Pain in left foot: Secondary | ICD-10-CM

## 2023-01-16 DIAGNOSIS — R2689 Other abnormalities of gait and mobility: Secondary | ICD-10-CM

## 2023-01-16 DIAGNOSIS — M79671 Pain in right foot: Secondary | ICD-10-CM

## 2023-01-16 DIAGNOSIS — M6281 Muscle weakness (generalized): Secondary | ICD-10-CM

## 2023-01-16 NOTE — Therapy (Signed)
OUTPATIENT PHYSICAL THERAPY TREATMENT NOTE   Patient Name: Whitney Benton MRN: [id number] DOB:Dec 22, 1971, 52 y.o., female Today's Date: 01/16/2023  PCP: Ladell Pier, MD  REFERRING PROVIDER: Yevonne Pax, DPM   END OF SESSION:   PT End of Session - 01/16/23 1740     Visit Number 7    Number of Visits 17    Date for PT Re-Evaluation 02/05/23    Authorization Type [employer]    PT Start Time 1740    PT Stop Time 6389    PT Time Calculation (min) 50 min    Activity Tolerance Patient tolerated treatment well    Behavior During Therapy WFL for tasks assessed/performed                Past Medical History:  Diagnosis Date   Allergy    Anxiety    Arthritis    Asthma    Depression    Diabetes mellitus without complication ([place])    GERD (gastroesophageal reflux disease)    Glaucoma suspect    Hyperlipidemia    Trichomonas infection    Past Surgical History:  Procedure Laterality Date   CESAREAN SECTION     UPPER GASTROINTESTINAL ENDOSCOPY     VAGINAL HYSTERECTOMY  12/23/2004   Fibroids, menorrhagia, benign pathology   Patient Active Problem List   Diagnosis Date Noted   Severe recurrent major depressive disorder with psychotic features ([place]) 12/27/2022   Primary insomnia 12/27/2022   Morbid obesity ([place]) 04/04/2022   Former smoker 04/30/2021   Major depressive disorder, recurrent episode, moderate ([place]) 04/30/2021   Substance induced mood disorder ([place]) 03/22/2021   Generalized anxiety disorder 03/22/2021   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   DKA (diabetic ketoacidosis) ([place]) 01/23/2021   Glaucoma suspect 04/12/2020   DKA, type 2, not at goal [facility]) 10/02/2019   AKI (acute kidney injury) ([place]) 10/02/2019   Hyperkalemia 10/02/2019   Leukocytosis 10/02/2019   Abnormal LFTs 10/02/2019   Stressful life events affecting family and household 08/06/2019   New onset type 2 diabetes mellitus ([place]) 02/04/2019   Obesity (BMI 35.0-39.9  without comorbidity) 02/04/2019   Tobacco dependence 02/04/2019   Left arm weakness 12/06/2014   Paresthesias/numbness 12/06/2014   Tobacco abuse 11/14/2014   Depression    Mixed incontinence 06/27/2014   History of TVH in 2006 for fibroids and menorrhagia; benign pathology 09/08/2011    REFERRING DIAG: M72.2 (ICD-10-CM) - Plantar fasciitis, bilateral    THERAPY DIAG:  Pain in right foot  Pain in left foot  Muscle weakness (generalized)  Other abnormalities of gait and mobility  Rationale for Evaluation and Treatment Rehabilitation  PERTINENT HISTORY: Anxiety, Depression, DMII   PRECAUTIONS: None  SUBJECTIVE:  SUBJECTIVE STATEMENT:  Pt presents to PT with continued reports of continued severe pain.   PAIN:  Are you having pain?  Yes: NPRS scale: 9/10 Worst: 10/10 Pain location: bilateral heels Pain description: sharp Aggravating factors: stairs, first step out of bed Relieving factors: none   OBJECTIVE: (objective measures completed at initial evaluation unless otherwise dated)   DIAGNOSTIC FINDINGS:             See imaging    PATIENT SURVEYS:  FOTO: 28% function; 52% predicted   COGNITION: Overall cognitive status: Within functional limits for tasks assessed                         SENSATION: Not tested   POSTURE: rounded shoulders, forward head, and larger body habitus   PALPATION: TTP to bilateral heels, bilateral gastroc   LOWER EXTREMITY ROM:   Active ROM Right eval Left eval Right 01/16/23 Left 01/16/23  Hip flexion        Hip extension        Hip abduction        Hip adduction        Hip internal rotation        Hip external rotation        Knee flexion        Knee extension        Ankle dorsiflexion -5 -10 -1 -8  Ankle plantarflexion        Ankle  inversion        Ankle eversion         (Blank rows = not tested)   LOWER EXTREMITY MMT:   MMT Right eval Left eval  Hip flexion      Hip extension      Hip abduction      Hip adduction      Hip internal rotation      Hip external rotation      Knee flexion      Knee extension      Ankle dorsiflexion      Ankle plantarflexion 4/5 3+/5 p!  Ankle inversion      Ankle eversion       (Blank rows = not tested)   LOWER EXTREMITY SPECIAL TESTS:  DNT   FUNCTIONAL TESTS:  Five Time Sit to Stand: 22 seconds SLS: 5" R  5" L   GAIT: Distance walked: 93f Assistive device utilized: None Level of assistance: Complete Independence Comments: antalgica gait L, decreased heel strike   TREATMENT: [facility] Adult PT Treatment:                                                DATE: 01/16/2023 Therapeutic Exercise: Nustep level 6 x 5 mins Slant board gastroc stretch x2' Slant board soleus stretch x2' Standing heel raises toes on Airex 2x10 Standing heel raises with tennis ball btw heels 2x15 Ankle 4-way 2x10 RTB Neuromuscular re-ed: Heel Toe raises on foam x 15  FT on Airex with head turns x30" Semi tandem on Airex x30" BIL Manual Therapy: STM/MTPR BIL gastroc  [facility] Adult PT Treatment:  DATE: 01/14/2023 Therapeutic Exercise: Nustep level 6 x 5 mins Slant board gastroc stretch x2' Slant board soleus stretch x2' Standing heel raises toes on Airex 2x10 Standing heel raises with tennis ball btw heels 2x10 Standing toe raises 2x10 using wall Rocker board PF/DF x 60" Seated heel raises with 15# KB on 2" step 2x10 Neuromuscular re-ed: FT on Airex x30"  FT on Airex with head turns x30" Semi tandem on Airex x30" BIL Manual Therapy: STM/MTPR BIL gastroc Tiger tail BIL gastroc  [facility] Adult PT Treatment:                                                DATE: 01/09/2023 Therapeutic Exercise: Nustep level 6 x 5 mins Slant board gastroc stretch  x2' Slant board soleus stretch x2' Seated plantar fascia stretch x1' BIL Standing heel/toe raises 2x10 using wall Rocker board PF/DF x 60" Ankle DF/inv/ev x10 RTB BIL Seated heel raises with 10# KB on 2" step 2x15  Neuromuscular re-ed: FT on Airex x30"  Semi tandem on Airex x30" BIL Manual Therapy: STM/MTPR BIL gastroc Tiger tail BIL gastroc  [facility] Adult PT Treatment:                                                DATE: 01/07/2023 Therapeutic Exercise: Nustep level 6 x 5 mins Slant board gastroc stretch x2' Standing heel/toe raises 2x10 using wall Rocker board PF/DF x 60" Ankle DF/inv/ev 2x10 RTB Seated heel raises with ball 2x15  BAPs circles CW/CCW x10 each BIL Neuromuscular re-ed: FT on Airex x30"  Semi tandem on Airex x30" BIL Semi tandem x30" BIL   PATIENT EDUCATION:  Education details: eval findings, FOTO, HEP, POC Person educated: Patient Education method: Explanation, Demonstration, and Handouts Education comprehension: verbalized understanding and returned demonstration   HOME EXERCISE PROGRAM: Access Code: [facility] URL: [email or web address] Date: 12/11/2022 Prepared by: Octavio Manns   Exercises - Long Sitting Calf Stretch with Strap  - 2 x daily - 7 x weekly - 2-3 reps - 30 sec hold - Towel Scrunches  - 2 x daily - 7 x weekly - 2 reps - 60 sec hold - Seated Plantar Fascia Mobilization with Small Ball  - 2 x daily - 7 x weekly - 3 sets - 10 reps - Ankle and Toe Plantarflexion with Resistance  - 2 x daily - 7 x weekly - 3 sets - 10 reps - blue theraband hold   ASSESSMENT:   CLINICAL IMPRESSION: Pt able to complete all prescribed exercises with no adverse effect. Therapy focused on distal LE strengthening and balance. Responded well to manual therapy interventions, noting decreased pain post session. Pt is progressing with therapy, will continue per POC.      OBJECTIVE IMPAIRMENTS: Abnormal gait, decreased activity tolerance, decreased  mobility, difficulty walking, decreased ROM, decreased strength, and pain.    ACTIVITY LIMITATIONS: standing, squatting, stairs, transfers, and locomotion level   PARTICIPATION LIMITATIONS: meal prep, cleaning, laundry, driving, community activity, and yard work   PERSONAL FACTORS: Fitness and 3+ comorbidities: Anxiety, Depression, DMII  are also affecting patient's functional outcome.      GOALS: Goals reviewed with patient? No   SHORT TERM GOALS: Target date: 01/01/2023   Pt will  be compliant and knowledgeable with initial HEP for improved comfort and carryover Baseline: initial HEP given  Goal status: Ongoing Patient reports sporadic compliance 01/07/23   2.  Pt will self report bilateral heel pain no greater than 6/10 for improved comfort and functional ability Baseline: 10/10 at worst Goal status: Ongoing Pt reports 10/10 at worst 01/07/23   LONG TERM GOALS: Target date: 02/05/2023   Pt will self report bilateral heel pain no greater than 3/10 for improved comfort and functional ability Baseline: 10/10 at worst Goal status: INITIAL    2.  Pt will improve FOTO function score to no less than 52% as proxy for functional improvement Baseline: 28% function Goal status: INITIAL    3.  Pt will improve Five Time Sit to Stand to no greater than 15 seconds for improved balance and functional mobility Baseline: 22 seconds with pain Goal status: INITIAL   4.  Pt will improve bilateral ankle DF to no less than 5 for improved gait and decreased pain  Baseline: see chart Goal status: INITIAL   5.  Pt will improve bilateral SLS time to no less than 15 seconds for improved balance and ankle stability Baseline: 4" R  2" L - (L painful) Goal status: INITIAL     PLAN:   PT FREQUENCY: 2x/week   PT DURATION: 8 weeks   PLANNED INTERVENTIONS: Therapeutic exercises, Therapeutic activity, Neuromuscular re-education, Balance training, Gait training, Patient/Family education, Self Care,  Joint mobilization, Aquatic Therapy, Dry Needling, Electrical stimulation, Cryotherapy, Moist heat, Manual therapy, and Re-evaluation   PLAN FOR NEXT SESSION: assess HEP response, ankle strengthening, calf stretching, TPDN?   Ward Chatters, PT 01/16/2023, 6:41 PM

## 2023-01-17 ENCOUNTER — Encounter (HOSPITAL_COMMUNITY): Payer: Self-pay

## 2023-01-17 ENCOUNTER — Ambulatory Visit (HOSPITAL_COMMUNITY)
Admission: EM | Admit: 2023-01-17 | Discharge: 2023-01-17 | Disposition: A | Discharge status: Home / Self Care | Payer: Commercial Managed Care - HMO | Attending: Physician Assistant | Admitting: Physician Assistant

## 2023-01-17 DIAGNOSIS — R9431 Abnormal electrocardiogram [ECG] [EKG]: Secondary | ICD-10-CM | POA: Insufficient documentation

## 2023-01-17 DIAGNOSIS — R42 Dizziness and giddiness: Secondary | ICD-10-CM | POA: Insufficient documentation

## 2023-01-17 LAB — POCT URINALYSIS DIPSTICK, ED / UC
Bilirubin Urine: NEGATIVE
Glucose, UA: 100 mg/dL — AB
Nitrite: NEGATIVE
Protein, ur: 100 mg/dL — AB
Specific Gravity, Urine: >=1.03 (ref 1.005–1.030)
Urobilinogen, UA: 1 mg/dL (ref 0.0–1.0)
pH: 5.5 (ref 5.0–8.0)

## 2023-01-17 LAB — COMPREHENSIVE METABOLIC PANEL
ALT: 68 U/L — ABNORMAL HIGH (ref 0–44)
AST: 47 U/L — ABNORMAL HIGH (ref 15–41)
Albumin: 3.8 g/dL (ref 3.5–5.0)
Alkaline Phosphatase: 114 U/L (ref 38–126)
Anion gap: 9 (ref 5–15)
BUN: 11 mg/dL (ref 6–20)
CO2: 29 mmol/L (ref 22–32)
Calcium: 9.4 mg/dL (ref 8.9–10.3)
Chloride: 101 mmol/L (ref 98–111)
Creatinine, Ser: 0.91 mg/dL (ref 0.44–1.00)
GFR, Estimated: >60 mL/min (ref >60)
Glucose, Bld: 209 mg/dL — ABNORMAL HIGH (ref 70–99)
Potassium: 4.2 mmol/L (ref 3.5–5.1)
Sodium: 139 mmol/L (ref 135–145)
Total Bilirubin: 0.5 mg/dL (ref 0.3–1.2)
Total Protein: 7.6 g/dL (ref 6.5–8.1)

## 2023-01-17 LAB — CBC
HCT: 40.3 % (ref 36.0–46.0)
Hemoglobin: 13.5 g/dL (ref 12.0–15.0)
MCH: 28.5 pg (ref 26.0–34.0)
MCHC: 33.5 g/dL (ref 30.0–36.0)
MCV: 85 fL (ref 80.0–100.0)
Platelets: 401 10*3/uL — ABNORMAL HIGH (ref 150–400)
RBC: 4.74 MIL/uL (ref 3.87–5.11)
RDW: 14.1 % (ref 11.5–15.5)
WBC: 8.7 10*3/uL (ref 4.0–10.5)
nRBC: 0 % (ref 0.0–0.2)

## 2023-01-17 LAB — CBG MONITORING, ED: Glucose-Capillary: 219 mg/dL — ABNORMAL HIGH (ref 70–99)

## 2023-01-17 NOTE — ED Triage Notes (Signed)
Chief Complaint: Patient has been light headed, dizzy, off balance, blurry vision. No headaches, falls, or known injuries. Patient has been nauseous but no vomiting.   Onset: Today  Prescriptions or OTC medications tried: No    Sick exposure: No  New foods, medications, or products: No  Recent Travel: No

## 2023-01-17 NOTE — ED Provider Notes (Signed)
[place]    CSN: [id number] Arrival date & time: 01/17/23  1654      History   Chief Complaint Chief Complaint  Patient presents with   Dizziness   Nausea    HPI Whitney Benton BOLSER is a 52 y.o. female.   Patient presents today with a several hour history (approximately 12 hours) of lightheadedness.  She describes this as a lightheaded sensation that she is going to pass out but has not had any syncopal episode.  She also reports that she feels slightly off balance.  She denies any room spinning sensation or typical vertigo.  Symptoms began when she woke up this morning and have been persistent since that time with variation in intensity.  She had some nausea at 1 point but this is since resolved.  She denies any recent illness or additional symptoms including cough, congestion.  She denies any recent head injury.  Denies any change to medications.  She has not tried any over-the-counter medication for symptom management.  She is eating and drinking normally.  She does have a history of diabetes but reports that her blood sugars have been appropriately controlled.  She denies any blood loss including melena or hematochezia; no concern for uterine blood loss that she is status post hysterectomy.  She denies any associated headache, visual disturbance, dysarthria, focal weakness.    Past Medical History:  Diagnosis Date   Allergy    Anxiety    Arthritis    Asthma    Depression    Diabetes mellitus without complication ([place])    GERD (gastroesophageal reflux disease)    Glaucoma suspect    Hyperlipidemia    Trichomonas infection     Patient Active Problem List   Diagnosis Date Noted   Severe recurrent major depressive disorder with psychotic features ([place]) 12/27/2022   Primary insomnia 12/27/2022   Morbid obesity ([place]) 04/04/2022   Former smoker 04/30/2021   Major depressive disorder, recurrent episode, moderate ([place]) 04/30/2021   Substance induced mood disorder ([place])  03/22/2021   Generalized anxiety disorder 03/22/2021   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   DKA (diabetic ketoacidosis) ([place]) 01/23/2021   Glaucoma suspect 04/12/2020   DKA, type 2, not at goal [facility]) 10/02/2019   AKI (acute kidney injury) ([place]) 10/02/2019   Hyperkalemia 10/02/2019   Leukocytosis 10/02/2019   Abnormal LFTs 10/02/2019   Stressful life events affecting family and household 08/06/2019   New onset type 2 diabetes mellitus ([place]) 02/04/2019   Obesity (BMI 35.0-39.9 without comorbidity) 02/04/2019   Tobacco dependence 02/04/2019   Left arm weakness 12/06/2014   Paresthesias/numbness 12/06/2014   Tobacco abuse 11/14/2014   Depression    Mixed incontinence 06/27/2014   History of TVH in 2006 for fibroids and menorrhagia; benign pathology 09/08/2011    Past Surgical History:  Procedure Laterality Date   CESAREAN SECTION     UPPER GASTROINTESTINAL ENDOSCOPY     VAGINAL HYSTERECTOMY  12/23/2004   Fibroids, menorrhagia, benign pathology    OB History     Gravida  5   Para  3   Term  3   Preterm      AB  2   Living  3      SAB  2   IAB  0   Ectopic  0   Multiple  0   Live Births               Home Medications    Prior to Admission  medications   Medication Sig Start Date End Date Taking? Authorizing Provider  acetaminophen (TYLENOL) 500 MG tablet Take 1 tablet (500 mg total) by mouth every 6 (six) hours as needed. 10/06/22  Yes Redwine, Madison A, PA-C  acetaminophen-codeine (TYLENOL #3) 300-30 MG tablet Take 1 tablet by mouth 2 (two) times daily as needed for moderate pain. 12/10/22  Yes Aundra Dubin, PA-C  albuterol (VENTOLIN HFA) 108 (90 Base) MCG/ACT inhaler Inhale 2 puffs into the lungs every 6 (six) hours as needed for wheezing or shortness of breath ([place]). 04/29/22  Yes Lynden Oxford Scales, PA-C  atorvastatin (LIPITOR) 40 MG tablet Take 1 tablet (40 mg total) by mouth daily. 01/06/23  Yes Freada Bergeron, MD   Blood Glucose Monitoring Suppl (TRUE METRIX METER) w/Device KIT Check blood sugars three times a day 08/09/21  Yes Ladell Pier, MD  busPIRone (BUSPAR) 15 MG tablet Take 1 tablet (15 mg total) by mouth 3 (three) times daily. 09/24/22  Yes Eulis Canner E, NP  Continuous Blood Gluc Receiver (FREESTYLE LIBRE 2 READER) DEVI Use to check blood sugar three times daily. 07/23/22  Yes Ladell Pier, MD  Continuous Blood Gluc Sensor (FREESTYLE LIBRE 2 SENSOR) MISC Use to check blood sugar three times daily. 07/23/22  Yes Ladell Pier, MD  Continuous Blood Gluc Sensor (FREESTYLE LIBRE 2 SENSOR) MISC take as directed 08/15/22  Yes Charlott Rakes, MD  cycloSPORINE (RESTASIS) 0.05 % ophthalmic emulsion Apply 1 drop into both eyes twice a day 09/19/21  Yes   doxepin (SINEQUAN) 25 MG capsule Take 1 capsule (25 mg total) by mouth at bedtime. 11/19/22  Yes Eulis Canner E, NP  DULoxetine (CYMBALTA) 20 MG capsule Take 1 capsule (20 mg total) by mouth daily. 06/24/22  Yes Eulis Canner E, NP  DULoxetine (CYMBALTA) 60 MG capsule Take 1 capsule (60 mg total) by mouth daily. 12/27/22  Yes Salley Slaughter, NP  Erenumab-aooe (AIMOVIG) 140 MG/ML SOAJ Inject 140 mg into the skin every 28 (twenty-eight) days. 09/10/22  Yes Pieter Partridge, DO  Estradiol 10 MCG TABS vaginal tablet Insert one tab intravaginally 2 times a week 11/24/22  Yes Ladell Pier, MD  fluticasone [facility]) 50 MCG/ACT nasal spray Place 1 spray into both nostrils daily. Begin by using 2 sprays in each nare daily for 3 to 5 days, then decrease to 1 spray in each nare daily. 04/29/22  Yes Lynden Oxford Scales, PA-C  gabapentin (NEURONTIN) 300 MG capsule Take 1 capsule (300 mg total) by mouth 3 (three) times daily. 12/27/22  Yes Eulis Canner E, NP  glucose blood (TRUE METRIX BLOOD GLUCOSE TEST) test strip Use to check blood sugar 3 times daily 07/06/21  Yes Ladell Pier, MD  hydrOXYzine (ATARAX) 25 MG tablet TAKE 1 TABLET (25 MG  TOTAL) BY MOUTH 3 (THREE) TIMES DAILY AS NEEDED. 12/27/22  Yes Eulis Canner E, NP  insulin aspart (NOVOLOG FLEXPEN) 100 UNIT/ML FlexPen Inject 26 Units into the skin 3 (three) times daily with meals. 01/02/23  Yes Ladell Pier, MD  Insulin Glargine [facility] KWIKPEN) 100 UNIT/ML Inject 40 Units into the skin 2 (two) times daily. 01/02/23  Yes Ladell Pier, MD  Insulin Pen Needle (PEN NEEDLES) 31G X 6 MM MISC Use as directed 02/26/19  Yes Ladell Pier, MD  lidocaine (LIDODERM) 5 % Place 1 patch onto the skin daily. Remove & Discard patch within 12 hours or as directed by MD 10/06/22  Yes Redwine, Madison A, PA-C  metFORMIN (GLUCOPHAGE-XR) 500 MG 24 hr tablet Take 1 tablet by mouth twice daily 01/15/23  Yes Ladell Pier, MD  methocarbamol (ROBAXIN) 500 MG tablet Take 1 tablet (500 mg total) by mouth 3 (three) times daily as needed for muscle spasms. 09/04/22  Yes Aundra Dubin, PA-C  metoprolol tartrate (LOPRESSOR) 25 MG tablet Take 0.5 tablets (12.5 mg total) by mouth 2 (two) times daily. 05/09/22  Yes Freada Bergeron, MD  Multiple Vitamin (MULTIVITAMIN ADULT) TABS Take 1 tablet by mouth daily.   Yes [provider]  Multiple Vitamins-Minerals (EYE VITAMINS PO) Take 1 tablet by mouth daily.   Yes [provider]  OLANZapine (ZYPREXA) 5 MG tablet Take 1 tablet (5 mg total) by mouth at bedtime. 12/27/22  Yes Eulis Canner E, NP  omeprazole (PRILOSEC) 40 MG capsule Take 1 capsule (40 mg total) by mouth daily. Take 30 minutes prior to breakfast 10/09/22  Yes Willia Craze, NP  ondansetron (ZOFRAN-ODT) 4 MG disintegrating tablet Take 1 tablet (4 mg total) by mouth every 8 (eight) hours as needed for nausea or vomiting. 09/03/22  Yes Ladell Pier, MD  SUMAtriptan (IMITREX) 100 MG tablet TAKE 1 TABLET EARLIEST ONSET OF MIGRAINE. MAY REPEAT IN 2 HOURS IF HEADACHE PERSISTS OR RECURS. MAXIMUM 2 TABLETS IN 24 HOURS. 09/10/22 09/10/23 Yes Tomi Likens, Adam R, DO   triamcinolone cream (KENALOG) 0.1 % Apply 1 Application topically 2 (two) times daily. 08/12/22  Yes Ladell Pier, MD  TRUEPLUS LANCETS 28G MISC Check blood sugars three time a day 02/04/19  Yes Ladell Pier, MD  zolpidem (AMBIEN) 5 MG tablet Take 1 tablet (5 mg total) by mouth at bedtime as needed for sleep. 12/27/22  Yes Eulis Canner E, NP  fexofenadine (ALLEGRA) 180 MG tablet Take 1 tablet (180 mg total) by mouth daily. Patient not taking: Reported on 10/17/2022 04/29/22 10/26/22  Lynden Oxford Scales, PA-C  insulin aspart (NOVOLOG) 100 UNIT/ML FlexPen Inject 24 Units into the skin daily before breakfast AND 24 Units daily before lunch AND 24 Units daily before supper. 12/12/22 01/01/23  Ladell Pier, MD  meloxicam (MOBIC) 15 MG tablet Take 1 tablet (15 mg total) by mouth daily. 09/19/22   Standiford, Nena Alexander, DPM  cetirizine (ZYRTEC) 10 MG tablet Take 1 tablet (10 mg total) by mouth daily. Patient not taking: No sig reported 03/31/19 03/03/20  Charlott Rakes, MD    Family History Family History  Problem Relation Age of Onset   Diabetes Mother    Mental illness Mother    Depression Mother    Hypertension Mother    Colon cancer Father 52   Hypertension Father    Diabetes Father    Colon cancer Paternal Grandmother    Esophageal cancer Neg Hx    Stomach cancer Neg Hx    Rectal cancer Neg Hx     Social History Social History   Tobacco Use   Smoking status: Former    Packs/day: 0.25    Years: 8.00    Total pack years: 2.00    Types: Cigarettes    Quit date: 2021    Years since quitting: 3.0   Smokeless tobacco: Never  Vaping Use   Vaping Use: Never used  Substance Use Topics   Alcohol use: Not Currently    Comment: occasional   Drug use: No     Allergies   Aspirin and Oxycodone   Review of Systems Review of Systems  Constitutional:  Positive for activity change.  Negative for appetite change, fatigue and fever.  Eyes:  Negative for photophobia  and visual disturbance.  Respiratory:  Negative for cough and shortness of breath.   Cardiovascular:  Negative for chest pain, palpitations and leg swelling.  Gastrointestinal:  Negative for abdominal pain, diarrhea, nausea (resolved) and vomiting.  Neurological:  Positive for light-headedness. Negative for dizziness, seizures, syncope, facial asymmetry, speech difficulty, weakness, numbness and headaches.     Physical Exam Triage Vital Signs ED Triage Vitals  Enc Vitals Group     BP 01/17/23 1902 115/80     Pulse Rate 01/17/23 1902 86     Resp 01/17/23 1902 16     Temp 01/17/23 1902 98.5 F (36.9 C)     Temp Source 01/17/23 1902 Oral     SpO2 01/17/23 1902 95 %     Weight 01/17/23 1902 225 lb 1.4 oz (102.1 kg)     Height 01/17/23 1902 '5\' 3"'$  (1.6 m)     Head Circumference --      Peak Flow --      Pain Score 01/17/23 1900 9     Pain Loc --      Pain Edu? --      Excl. in [place]? --    Orthostatic VS for the past 24 hrs:  BP- Lying Pulse- Lying BP- Sitting Pulse- Sitting BP- Standing at 0 minutes Pulse- Standing at 0 minutes  01/17/23 1928 128/89 89 (!) 127/94 91 (!) 126/92 97    Updated Vital Signs BP 115/80 (BP Location: Left Arm)   Pulse 86   Temp 98.5 F (36.9 C) (Oral)   Resp 16   Ht '5\' 3"'$  (1.6 m)   Wt 225 lb 1.4 oz (102.1 kg)   SpO2 95%   BMI 39.87 kg/m   Visual Acuity Right Eye Distance:   Left Eye Distance:   Bilateral Distance:    Right Eye Near:   Left Eye Near:    Bilateral Near:     Physical Exam Vitals reviewed.  Constitutional:      General: She is awake. She is not in acute distress.    Appearance: Normal appearance. She is well-developed. She is not ill-appearing.     Comments: Very pleasant female appears stated age in no acute distress sitting comfortably in exam room  HENT:     Head: Normocephalic and atraumatic. No raccoon eyes, Battle's sign or contusion.     Right Ear: Tympanic membrane, ear canal and external ear normal. No hemotympanum.      Left Ear: Tympanic membrane, ear canal and external ear normal. No hemotympanum.     Mouth/Throat:     Tongue: Tongue does not deviate from midline.     Pharynx: Uvula midline. No oropharyngeal exudate or posterior oropharyngeal erythema.  Eyes:     Extraocular Movements: Extraocular movements intact.     Pupils: Pupils are equal, round, and reactive to light.  Cardiovascular:     Rate and Rhythm: Normal rate and regular rhythm.     Heart sounds: Normal heart sounds, S1 normal and S2 normal. No murmur heard. Pulmonary:     Effort: Pulmonary effort is normal.     Breath sounds: Normal breath sounds. No wheezing, rhonchi or rales.     Comments: Clear to auscultation bilaterally Musculoskeletal:     Cervical back: No spinous process tenderness or muscular tenderness.     Right lower leg: No edema.     Left lower leg: No edema.  Comments: Strength 5/5 bilateral upper and lower extremities  Neurological:     General: No focal deficit present.     Mental Status: She is alert and oriented to person, place, and time.     Cranial Nerves: Cranial nerves 2-12 are intact.     Motor: Motor function is intact.     Coordination: Coordination is intact.     Gait: Gait is intact.     Comments: Cranial nerves II through XII grossly intact.  No focal neurological defect on exam.  Psychiatric:        Behavior: Behavior is cooperative.      UC Treatments / Results  Labs (all labs ordered are listed, but only abnormal results are displayed) Labs Reviewed  POCT URINALYSIS DIPSTICK, ED / UC - Abnormal; Notable for the following components:      Result Value   Glucose, UA 100 (*)    Ketones, ur TRACE (*)    Hgb urine dipstick TRACE (*)    Protein, ur 100 (*)    Leukocytes,Ua LARGE (*)    All other components within normal limits  CBG MONITORING, ED - Abnormal; Notable for the following components:   Glucose-Capillary 219 (*)    All other components within normal limits  URINE CULTURE   CBC  COMPREHENSIVE METABOLIC PANEL    EKG   Radiology No results found.  Procedures Procedures (including critical care time)  Medications Ordered in UC Medications - No data to display  Initial Impression / Assessment and Plan / UC Course  I have reviewed the triage vital signs and the nursing notes.  Pertinent labs & imaging results that were available during my care of the patient were reviewed by me and considered in my medical decision making (see chart for details).     EKG obtained which showed normal sinus rhythm with ventricular rate of 86 bpm without ischemic changes; compared to 10/07/2022 tracing patient has later R wave progression and inverted T waves in V4 through V6.  We discussed that it is reasonable to go to the emergency room since her symptoms are persisting and given her abnormal EKG/EKG changes, however, she declined this for the time being.  We discussed that if she has persistent or worsening/changing symptoms she needs to go to the emergency room immediately.  She will need to follow-up closely with cardiology; she is already established will call them first thing Monday.  Orthostatic vital signs were normal but she did have worsening of symptoms with positional changes.  Blood sugar was slightly elevated but UA only had trace ketones so low suspicion for DKA.  UA did have leukocyte esterase so we will obtain culture to ensure that UTI is not contributing to symptoms, however, we are going to defer antibiotics until culture results are available.  CBC and CMP were obtained and are pending.  Discussed that she should drink plenty of fluid and eat small frequent meals.  Discussed again that the safest thing to do is to go to the emergency room but she continues to decline.  She is agreeable that if her symptoms or not improving by tomorrow or if she has any worsening symptoms she will go immediately to the ER.  Recommended close follow-up with her primary care and  cardiologist outpatient to which she is agreeable.  Strict return precautions given.  Work excuse note provided.  Final Clinical Impressions(s) / UC Diagnoses   Final diagnoses:  Lightheadedness  Nonspecific abnormal electrocardiogram (ECG) (EKG)  Discharge Instructions      I will contact you with your lab work tomorrow if anything is abnormal.  As we discussed, you did have some changes to your EKG.  I recommend you follow-up with cardiology soon as possible.  If you continue to have lightheadedness or you have any additional or changing symptoms such as chest pain, shortness of breath, passing out, weakness you need to go to the emergency room immediately.  Make sure that you are drinking plenty of fluid and eat small frequent meals.  If anything changes go to the emergency room immediately.     ED Prescriptions   None    PDMP not reviewed this encounter.   Terrilee Croak, PA-C 01/17/23 2100

## 2023-01-17 NOTE — Discharge Instructions (Addendum)
I will contact you with your lab work tomorrow if anything is abnormal.  As we discussed, you did have some changes to your EKG.  I recommend you follow-up with cardiology soon as possible.  If you continue to have lightheadedness or you have any additional or changing symptoms such as chest pain, shortness of breath, passing out, weakness you need to go to the emergency room immediately.  Make sure that you are drinking plenty of fluid and eat small frequent meals.  If anything changes go to the emergency room immediately.

## 2023-01-19 LAB — URINE CULTURE

## 2023-01-20 ENCOUNTER — Encounter: Payer: Self-pay | Admitting: Internal Medicine

## 2023-01-21 ENCOUNTER — Ambulatory Visit: Payer: Commercial Managed Care - HMO

## 2023-01-21 DIAGNOSIS — R2689 Other abnormalities of gait and mobility: Secondary | ICD-10-CM

## 2023-01-21 DIAGNOSIS — M79671 Pain in right foot: Secondary | ICD-10-CM | POA: Diagnosis not present

## 2023-01-21 DIAGNOSIS — M79672 Pain in left foot: Secondary | ICD-10-CM

## 2023-01-21 DIAGNOSIS — M6281 Muscle weakness (generalized): Secondary | ICD-10-CM

## 2023-01-21 NOTE — Therapy (Signed)
OUTPATIENT PHYSICAL THERAPY TREATMENT NOTE   Patient Name: Whitney Benton MRN: [id number] DOB:[birth year], 52 y.o., female Today's Date: 01/21/2023  PCP: Ladell Pier, MD  REFERRING PROVIDER: Yevonne Pax, DPM   END OF SESSION:   PT End of Session - 01/21/23 1828     Visit Number 8    Number of Visits 17    Date for PT Re-Evaluation 02/05/23    Authorization Type [employer]    PT Start Time 2122    PT Stop Time 1908    PT Time Calculation (min) 40 min    Activity Tolerance Patient tolerated treatment well    Behavior During Therapy WFL for tasks assessed/performed             Past Medical History:  Diagnosis Date   Allergy    Anxiety    Arthritis    Asthma    Depression    Diabetes mellitus without complication ([place])    GERD (gastroesophageal reflux disease)    Glaucoma suspect    Hyperlipidemia    Trichomonas infection    Past Surgical History:  Procedure Laterality Date   CESAREAN SECTION     UPPER GASTROINTESTINAL ENDOSCOPY     VAGINAL HYSTERECTOMY  12/23/2004   Fibroids, menorrhagia, benign pathology   Patient Active Problem List   Diagnosis Date Noted   Severe recurrent major depressive disorder with psychotic features ([place]) 12/27/2022   Primary insomnia 12/27/2022   Morbid obesity ([place]) 04/04/2022   Former smoker 04/30/2021   Major depressive disorder, recurrent episode, moderate ([place]) 04/30/2021   Substance induced mood disorder ([place]) 03/22/2021   Generalized anxiety disorder 03/22/2021   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   DKA (diabetic ketoacidosis) ([place]) 01/23/2021   Glaucoma suspect 04/12/2020   DKA, type 2, not at goal [facility]) 10/02/2019   AKI (acute kidney injury) ([place]) 10/02/2019   Hyperkalemia 10/02/2019   Leukocytosis 10/02/2019   Abnormal LFTs 10/02/2019   Stressful life events affecting family and household 08/06/2019   New onset type 2 diabetes mellitus ([place]) 02/04/2019   Obesity (BMI 35.0-39.9 without  comorbidity) 02/04/2019   Tobacco dependence 02/04/2019   Left arm weakness 12/06/2014   Paresthesias/numbness 12/06/2014   Tobacco abuse 11/14/2014   Depression    Mixed incontinence 06/27/2014   History of TVH in 2006 for fibroids and menorrhagia; benign pathology 09/08/2011    REFERRING DIAG: M72.2 (ICD-10-CM) - Plantar fasciitis, bilateral    THERAPY DIAG:  Pain in right foot  Pain in left foot  Muscle weakness (generalized)  Other abnormalities of gait and mobility  Rationale for Evaluation and Treatment Rehabilitation  PERTINENT HISTORY: Anxiety, Depression, DMII   PRECAUTIONS: None  SUBJECTIVE:  SUBJECTIVE STATEMENT:  Pt presents to PT with continued reports of continued severe pain.   PAIN:  Are you having pain?  Yes: NPRS scale: 9/10 Worst: 10/10 Pain location: bilateral heels Pain description: sharp Aggravating factors: stairs, first step out of bed Relieving factors: none   OBJECTIVE: (objective measures completed at initial evaluation unless otherwise dated)   DIAGNOSTIC FINDINGS:             See imaging    PATIENT SURVEYS:  FOTO: 28% function; 52% predicted   COGNITION: Overall cognitive status: Within functional limits for tasks assessed                         SENSATION: Not tested   POSTURE: rounded shoulders, forward head, and larger body habitus   PALPATION: TTP to bilateral heels, bilateral gastroc   LOWER EXTREMITY ROM:   Active ROM Right eval Left eval Right 01/16/23 Left 01/16/23  Hip flexion        Hip extension        Hip abduction        Hip adduction        Hip internal rotation        Hip external rotation        Knee flexion        Knee extension        Ankle dorsiflexion -5 -10 -1 -8  Ankle plantarflexion        Ankle inversion         Ankle eversion         (Blank rows = not tested)   LOWER EXTREMITY MMT:   MMT Right eval Left eval  Hip flexion      Hip extension      Hip abduction      Hip adduction      Hip internal rotation      Hip external rotation      Knee flexion      Knee extension      Ankle dorsiflexion      Ankle plantarflexion 4/5 3+/5 p!  Ankle inversion      Ankle eversion       (Blank rows = not tested)   LOWER EXTREMITY SPECIAL TESTS:  DNT   FUNCTIONAL TESTS:  Five Time Sit to Stand: 22 seconds SLS: 5" R  5" L   GAIT: Distance walked: 62f Assistive device utilized: None Level of assistance: Complete Independence Comments: antalgica gait L, decreased heel strike   TREATMENT: [facility] Adult PT Treatment:                                                DATE: 01/21/2023 Therapeutic Exercise: Nustep level 6 x 5 mins Slant board gastroc stretch x2' Slant board soleus stretch x2' Standing heel raises toes on Airex 2x10 Standing heel raises with tennis ball btw heels 2x15 Neuromuscular re-ed: FT on Airex with head turns x30" Semi tandem on Airex x30" BIL Tandem walking on Airex with single UE support x3 laps Manual Therapy: STM/MTPR BIL gastroc  [facility] Adult PT Treatment:  DATE: 01/16/2023 Therapeutic Exercise: Nustep level 6 x 5 mins Slant board gastroc stretch x2' Slant board soleus stretch x2' Standing heel raises toes on Airex 2x10 Standing heel raises with tennis ball btw heels 2x15 Ankle 4-way 2x10 RTB Neuromuscular re-ed: Heel Toe raises on foam x 15  FT on Airex with head turns x30" Semi tandem on Airex x30" BIL Manual Therapy: STM/MTPR BIL gastroc  [facility] Adult PT Treatment:                                                DATE: 01/14/2023 Therapeutic Exercise: Nustep level 6 x 5 mins Slant board gastroc stretch x2' Slant board soleus stretch x2' Standing heel raises toes on Airex 2x10 Standing heel raises with tennis ball  btw heels 2x10 Standing toe raises 2x10 using wall Rocker board PF/DF x 60" Seated heel raises with 15# KB on 2" step 2x10 Neuromuscular re-ed: FT on Airex x30"  FT on Airex with head turns x30" Semi tandem on Airex x30" BIL Manual Therapy: STM/MTPR BIL gastroc Tiger tail BIL gastroc   PATIENT EDUCATION:  Education details: eval findings, FOTO, HEP, POC Person educated: Patient Education method: Explanation, Demonstration, and Handouts Education comprehension: verbalized understanding and returned demonstration   HOME EXERCISE PROGRAM: Access Code: [facility] URL: [email or web address] Date: 12/11/2022 Prepared by: Octavio Manns   Exercises - Long Sitting Calf Stretch with Strap  - 2 x daily - 7 x weekly - 2-3 reps - 30 sec hold - Towel Scrunches  - 2 x daily - 7 x weekly - 2 reps - 60 sec hold - Seated Plantar Fascia Mobilization with Small Ball  - 2 x daily - 7 x weekly - 3 sets - 10 reps - Ankle and Toe Plantarflexion with Resistance  - 2 x daily - 7 x weekly - 3 sets - 10 reps - blue theraband hold   ASSESSMENT:   CLINICAL IMPRESSION: Patient presents to PT with continued reports of severe BIL heel pain and reports HEP compliance. Session today continued to focus on distal LE strengthening and balance tasks. Patient was able to tolerate all prescribed exercises with no adverse effects. Patient continues to benefit from skilled PT services and should be progressed as able to improve functional independence.     OBJECTIVE IMPAIRMENTS: Abnormal gait, decreased activity tolerance, decreased mobility, difficulty walking, decreased ROM, decreased strength, and pain.    ACTIVITY LIMITATIONS: standing, squatting, stairs, transfers, and locomotion level   PARTICIPATION LIMITATIONS: meal prep, cleaning, laundry, driving, community activity, and yard work   PERSONAL FACTORS: Fitness and 3+ comorbidities: Anxiety, Depression, DMII  are also affecting patient's functional  outcome.      GOALS: Goals reviewed with patient? No   SHORT TERM GOALS: Target date: 01/01/2023   Pt will be compliant and knowledgeable with initial HEP for improved comfort and carryover Baseline: initial HEP given  Goal status: Ongoing Patient reports sporadic compliance 01/07/23   2.  Pt will self report bilateral heel pain no greater than 6/10 for improved comfort and functional ability Baseline: 10/10 at worst Goal status: Ongoing Pt reports 10/10 at worst 01/07/23   LONG TERM GOALS: Target date: 02/05/2023   Pt will self report bilateral heel pain no greater than 3/10 for improved comfort and functional ability Baseline: 10/10 at worst Goal status: INITIAL    2.  Pt will improve  FOTO function score to no less than 52% as proxy for functional improvement Baseline: 28% function Goal status: INITIAL    3.  Pt will improve Five Time Sit to Stand to no greater than 15 seconds for improved balance and functional mobility Baseline: 22 seconds with pain Goal status: INITIAL   4.  Pt will improve bilateral ankle DF to no less than 5 for improved gait and decreased pain  Baseline: see chart Goal status: INITIAL   5.  Pt will improve bilateral SLS time to no less than 15 seconds for improved balance and ankle stability Baseline: 4" R  2" L - (L painful) Goal status: INITIAL     PLAN:   PT FREQUENCY: 2x/week   PT DURATION: 8 weeks   PLANNED INTERVENTIONS: Therapeutic exercises, Therapeutic activity, Neuromuscular re-education, Balance training, Gait training, Patient/Family education, Self Care, Joint mobilization, Aquatic Therapy, Dry Needling, Electrical stimulation, Cryotherapy, Moist heat, Manual therapy, and Re-evaluation   PLAN FOR NEXT SESSION: assess HEP response, ankle strengthening, calf stretching, TPDN?   Margarette Canada, PTA 01/21/2023, 7:08 PM

## 2023-01-22 ENCOUNTER — Other Ambulatory Visit: Payer: Medicaid Other

## 2023-01-23 ENCOUNTER — Ambulatory Visit: Payer: Medicaid Other

## 2023-01-23 DIAGNOSIS — M6281 Muscle weakness (generalized): Secondary | ICD-10-CM | POA: Insufficient documentation

## 2023-01-23 DIAGNOSIS — M79672 Pain in left foot: Secondary | ICD-10-CM | POA: Insufficient documentation

## 2023-01-23 DIAGNOSIS — M79671 Pain in right foot: Secondary | ICD-10-CM | POA: Insufficient documentation

## 2023-01-23 DIAGNOSIS — R2689 Other abnormalities of gait and mobility: Secondary | ICD-10-CM | POA: Insufficient documentation

## 2023-01-23 NOTE — Therapy (Signed)
  OUTPATIENT PHYSICAL THERAPY TREATMENT NOTE  Patient arrived to therapy reporting gastrointestinal discomfort and that she has been sick all day and did not want to call and cancel at the last minute. She stated she did not want to continue with therapy today.  Arrived - no charge for today's visit   Margarette Canada, PTA 01/23/2023, 5:49 PM

## 2023-01-27 NOTE — Therapy (Signed)
OUTPATIENT PHYSICAL THERAPY TREATMENT NOTE   Patient Name: Whitney Benton MRN: [id number] DOB:10-Mar-1971, 52 y.o., female Today's Date: 01/29/2023  PCP: Ladell Pier, MD  REFERRING PROVIDER: Yevonne Pax, DPM   END OF SESSION:   PT End of Session - 01/28/23 1739     Visit Number 9    Number of Visits 17    Date for PT Re-Evaluation 02/05/23    Authorization Type [employer]    PT Start Time 1745    PT Stop Time 5102    PT Time Calculation (min) 45 min    Activity Tolerance Patient tolerated treatment well    Behavior During Therapy WFL for tasks assessed/performed                 Past Medical History:  Diagnosis Date   Allergy    Anxiety    Arthritis    Asthma    Depression    Diabetes mellitus without complication ([place])    GERD (gastroesophageal reflux disease)    Glaucoma suspect    Hyperlipidemia    Trichomonas infection    Past Surgical History:  Procedure Laterality Date   CESAREAN SECTION     UPPER GASTROINTESTINAL ENDOSCOPY     VAGINAL HYSTERECTOMY  12/23/2004   Fibroids, menorrhagia, benign pathology   Patient Active Problem List   Diagnosis Date Noted   Severe recurrent major depressive disorder with psychotic features ([place]) 12/27/2022   Primary insomnia 12/27/2022   Morbid obesity ([place]) 04/04/2022   Former smoker 04/30/2021   Major depressive disorder, recurrent episode, moderate ([place]) 04/30/2021   Substance induced mood disorder ([place]) 03/22/2021   Generalized anxiety disorder 03/22/2021   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   DKA (diabetic ketoacidosis) ([place]) 01/23/2021   Glaucoma suspect 04/12/2020   DKA, type 2, not at goal [facility]) 10/02/2019   AKI (acute kidney injury) ([place]) 10/02/2019   Hyperkalemia 10/02/2019   Leukocytosis 10/02/2019   Abnormal LFTs 10/02/2019   Stressful life events affecting family and household 08/06/2019   New onset type 2 diabetes mellitus ([place]) 02/04/2019   Obesity (BMI 35.0-39.9  without comorbidity) 02/04/2019   Tobacco dependence 02/04/2019   Left arm weakness 12/06/2014   Paresthesias/numbness 12/06/2014   Tobacco abuse 11/14/2014   Depression    Mixed incontinence 06/27/2014   History of TVH in 2006 for fibroids and menorrhagia; benign pathology 09/08/2011    REFERRING DIAG: M72.2 (ICD-10-CM) - Plantar fasciitis, bilateral    THERAPY DIAG:  Pain in right foot  Pain in left foot  Muscle weakness (generalized)  Rationale for Evaluation and Treatment Rehabilitation  PERTINENT HISTORY: Anxiety, Depression, DMII   PRECAUTIONS: None  SUBJECTIVE:  SUBJECTIVE STATEMENT:  Pt presents to with continued pain in bilateral feet. Has been compliant with HEP.   PAIN:  Are you having pain?  Yes: NPRS scale: 9/10 Worst: 10/10 Pain location: bilateral heels Pain description: sharp Aggravating factors: stairs, first step out of bed Relieving factors: none   OBJECTIVE: (objective measures completed at initial evaluation unless otherwise dated)   DIAGNOSTIC FINDINGS:             See imaging    PATIENT SURVEYS:  FOTO: 28% function; 52% predicted   COGNITION: Overall cognitive status: Within functional limits for tasks assessed                         SENSATION: Not tested   POSTURE: rounded shoulders, forward head, and larger body habitus   PALPATION: TTP to bilateral heels, bilateral gastroc   LOWER EXTREMITY ROM:   Active ROM Right eval Left eval Right 01/16/23 Left 01/16/23  Hip flexion        Hip extension        Hip abduction        Hip adduction        Hip internal rotation        Hip external rotation        Knee flexion        Knee extension        Ankle dorsiflexion -5 -10 -1 -8  Ankle plantarflexion        Ankle inversion        Ankle eversion          (Blank rows = not tested)   LOWER EXTREMITY MMT:   MMT Right eval Left eval  Hip flexion      Hip extension      Hip abduction      Hip adduction      Hip internal rotation      Hip external rotation      Knee flexion      Knee extension      Ankle dorsiflexion      Ankle plantarflexion 4/5 3+/5 p!  Ankle inversion      Ankle eversion       (Blank rows = not tested)   LOWER EXTREMITY SPECIAL TESTS:  DNT   FUNCTIONAL TESTS:  Five Time Sit to Stand: 22 seconds SLS: 5" R  5" L   GAIT: Distance walked: 19f Assistive device utilized: None Level of assistance: Complete Independence Comments: antalgica gait L, decreased heel strike   TREATMENT: [facility] Adult PT Treatment:                                                DATE: 01/28/2023 Therapeutic Exercise: Nustep level 6 x 5 mins Slant board gastroc stretch x 60" Slant board soleus stretch x 60" Standing heel raises with tennis ball btw heels x 20 Tandem on foam 2x30" each Mini squat on foam 2x10 Tandem walking on Airex with single UE support x3 laps Long sitting ankle 4-way 2x10 RTB Seated heel raise 2x15 10# DB Manual Therapy: STM/MTPR BIL gastroc  [facility] Adult PT Treatment:  DATE: 01/21/2023 Therapeutic Exercise: Nustep level 6 x 5 mins Slant board gastroc stretch x2' Slant board soleus stretch x2' Standing heel raises toes on Airex 2x10 Standing heel raises with tennis ball btw heels 2x15 Neuromuscular re-ed: FT on Airex with head turns x30" Semi tandem on Airex x30" BIL Tandem walking on Airex with single UE support x3 laps Manual Therapy: STM/MTPR BIL gastroc  [facility] Adult PT Treatment:                                                DATE: 01/16/2023 Therapeutic Exercise: Nustep level 6 x 5 mins Slant board gastroc stretch x2' Slant board soleus stretch x2' Standing heel raises toes on Airex 2x10 Standing heel raises with tennis ball btw heels 2x15 Ankle  4-way 2x10 RTB Neuromuscular re-ed: Heel Toe raises on foam x 15  FT on Airex with head turns x30" Semi tandem on Airex x30" BIL Manual Therapy: STM/MTPR BIL gastroc  [facility] Adult PT Treatment:                                                DATE: 01/14/2023 Therapeutic Exercise: Nustep level 6 x 5 mins Slant board gastroc stretch x2' Slant board soleus stretch x2' Standing heel raises toes on Airex 2x10 Standing heel raises with tennis ball btw heels 2x10 Standing toe raises 2x10 using wall Rocker board PF/DF x 60" Seated heel raises with 15# KB on 2" step 2x10 Neuromuscular re-ed: FT on Airex x30"  FT on Airex with head turns x30" Semi tandem on Airex x30" BIL Manual Therapy: STM/MTPR BIL gastroc Tiger tail BIL gastroc  [facility] Adult PT Treatment:                                                DATE: 01/09/2023 Therapeutic Exercise: Nustep level 6 x 5 mins Slant board gastroc stretch x2' Slant board soleus stretch x2' Seated plantar fascia stretch x1' BIL Standing heel/toe raises 2x10 using wall Rocker board PF/DF x 60" Ankle DF/inv/ev x10 RTB BIL Seated heel raises with 10# KB on 2" step 2x15  Neuromuscular re-ed: FT on Airex x30"  Semi tandem on Airex x30" BIL Manual Therapy: STM/MTPR BIL gastroc Tiger tail BIL gastroc  [facility] Adult PT Treatment:                                                DATE: 01/07/2023 Therapeutic Exercise: Nustep level 6 x 5 mins Slant board gastroc stretch x2' Standing heel/toe raises 2x10 using wall Rocker board PF/DF x 60" Ankle DF/inv/ev 2x10 RTB Seated heel raises with ball 2x15  BAPs circles CW/CCW x10 each BIL Neuromuscular re-ed: FT on Airex x30"  Semi tandem on Airex x30" BIL Semi tandem x30" BIL   PATIENT EDUCATION:  Education details: eval findings, FOTO, HEP, POC Person educated: Patient Education method: Explanation, Demonstration, and Handouts Education comprehension: verbalized understanding and returned demonstration    HOME EXERCISE PROGRAM: Access Code:  [facility] URL: [email or web address] Date: 12/11/2022 Prepared by: Octavio Manns   Exercises - Long Sitting Calf Stretch with Strap  - 2 x daily - 7 x weekly - 2-3 reps - 30 sec hold - Towel Scrunches  - 2 x daily - 7 x weekly - 2 reps - 60 sec hold - Seated Plantar Fascia Mobilization with Small Ball  - 2 x daily - 7 x weekly - 3 sets - 10 reps - Ankle and Toe Plantarflexion with Resistance  - 2 x daily - 7 x weekly - 3 sets - 10 reps - blue theraband hold   ASSESSMENT:   CLINICAL IMPRESSION: Pt able to complete all prescribed exercises with no adverse effect. Therapy focused on distal LE strengthening and balance. Responded well to manual therapy interventions, noting decreased pain post session. Pt is progressing with therapy, will continue per POC.        OBJECTIVE IMPAIRMENTS: Abnormal gait, decreased activity tolerance, decreased mobility, difficulty walking, decreased ROM, decreased strength, and pain.    ACTIVITY LIMITATIONS: standing, squatting, stairs, transfers, and locomotion level   PARTICIPATION LIMITATIONS: meal prep, cleaning, laundry, driving, community activity, and yard work   PERSONAL FACTORS: Fitness and 3+ comorbidities: Anxiety, Depression, DMII  are also affecting patient's functional outcome.      GOALS: Goals reviewed with patient? No   SHORT TERM GOALS: Target date: 01/01/2023   Pt will be compliant and knowledgeable with initial HEP for improved comfort and carryover Baseline: initial HEP given  Goal status: Ongoing Patient reports sporadic compliance 01/07/23   2.  Pt will self report bilateral heel pain no greater than 6/10 for improved comfort and functional ability Baseline: 10/10 at worst Goal status: Ongoing Pt reports 10/10 at worst 01/07/23   LONG TERM GOALS: Target date: 02/05/2023   Pt will self report bilateral heel pain no greater than 3/10 for improved comfort and functional  ability Baseline: 10/10 at worst Goal status: INITIAL    2.  Pt will improve FOTO function score to no less than 52% as proxy for functional improvement Baseline: 28% function Goal status: INITIAL    3.  Pt will improve Five Time Sit to Stand to no greater than 15 seconds for improved balance and functional mobility Baseline: 22 seconds with pain Goal status: INITIAL   4.  Pt will improve bilateral ankle DF to no less than 5 for improved gait and decreased pain  Baseline: see chart Goal status: INITIAL   5.  Pt will improve bilateral SLS time to no less than 15 seconds for improved balance and ankle stability Baseline: 4" R  2" L - (L painful) Goal status: INITIAL     PLAN:   PT FREQUENCY: 2x/week   PT DURATION: 8 weeks   PLANNED INTERVENTIONS: Therapeutic exercises, Therapeutic activity, Neuromuscular re-education, Balance training, Gait training, Patient/Family education, Self Care, Joint mobilization, Aquatic Therapy, Dry Needling, Electrical stimulation, Cryotherapy, Moist heat, Manual therapy, and Re-evaluation   PLAN FOR NEXT SESSION: assess HEP response, ankle strengthening, calf stretching, TPDN?   Ward Chatters, PT 01/29/2023, 8:27 AM

## 2023-01-28 ENCOUNTER — Ambulatory Visit: Payer: Medicaid Other | Attending: Podiatry

## 2023-01-28 DIAGNOSIS — M79671 Pain in right foot: Secondary | ICD-10-CM | POA: Diagnosis present

## 2023-01-28 DIAGNOSIS — R2689 Other abnormalities of gait and mobility: Secondary | ICD-10-CM | POA: Diagnosis present

## 2023-01-28 DIAGNOSIS — M79672 Pain in left foot: Secondary | ICD-10-CM | POA: Diagnosis present

## 2023-01-28 DIAGNOSIS — M6281 Muscle weakness (generalized): Secondary | ICD-10-CM

## 2023-01-29 ENCOUNTER — Telehealth: Payer: Self-pay | Admitting: Internal Medicine

## 2023-01-29 ENCOUNTER — Encounter: Payer: Self-pay | Admitting: Internal Medicine

## 2023-01-29 NOTE — Telephone Encounter (Signed)
Called & spoke to the patient. Verified name & DOB. Patient stated that Whitney Benton was termed on 01/21/2023 and she currently has [facility] [employer] [facility] [employer]. Called [facility] and spoke to [place] and informed of the authorization number and end date. Representative expressed verbal understanding.

## 2023-01-29 NOTE — Progress Notes (Signed)
I received a letter from [place] [employer] dated 01/23/2023 informing me that patient was approved for the CAT scan of the abdomen. Approval number is [id number] - [id number] and it is valid until 03/09/2023. I also received a letter from [place] [employer] dated 01/21/2023 informing me that CAT scan of the stomach was denied.  Will havw my CMA clarify with pt if she has [place] or [employer].

## 2023-01-30 ENCOUNTER — Other Ambulatory Visit (HOSPITAL_COMMUNITY): Payer: Self-pay | Admitting: Psychiatry

## 2023-01-30 ENCOUNTER — Ambulatory Visit: Payer: Medicaid Other

## 2023-01-30 ENCOUNTER — Ambulatory Visit: Payer: Commercial Managed Care - HMO | Admitting: Podiatry

## 2023-01-30 ENCOUNTER — Telehealth (HOSPITAL_COMMUNITY): Payer: Self-pay | Admitting: Psychiatry

## 2023-01-30 DIAGNOSIS — M79671 Pain in right foot: Secondary | ICD-10-CM

## 2023-01-30 DIAGNOSIS — F333 Major depressive disorder, recurrent, severe with psychotic symptoms: Secondary | ICD-10-CM

## 2023-01-30 DIAGNOSIS — M79672 Pain in left foot: Secondary | ICD-10-CM

## 2023-01-30 DIAGNOSIS — M6281 Muscle weakness (generalized): Secondary | ICD-10-CM

## 2023-01-30 MED ORDER — OLANZAPINE 15 MG PO TABS: 15.0000 mg | ORAL_TABLET | Freq: Every day | ORAL | 3 refills | Status: DC

## 2023-01-30 NOTE — Therapy (Signed)
OUTPATIENT PHYSICAL THERAPY TREATMENT NOTE   Patient Name: Whitney Benton MRN: [id number] DOB:[birth year], 52 y.o., female Today's Date: 01/30/2023  PCP: Ladell Pier, MD  REFERRING PROVIDER: Yevonne Pax, DPM   END OF SESSION:   PT End of Session - 01/30/23 1741     Visit Number 10    Number of Visits 17    Date for PT Re-Evaluation 02/05/23    Authorization Type [employer]    PT Start Time 1745    PT Stop Time 1825    PT Time Calculation (min) 40 min    Activity Tolerance Patient tolerated treatment well    Behavior During Therapy WFL for tasks assessed/performed                  Past Medical History:  Diagnosis Date   Allergy    Anxiety    Arthritis    Asthma    Depression    Diabetes mellitus without complication ([place])    GERD (gastroesophageal reflux disease)    Glaucoma suspect    Hyperlipidemia    Trichomonas infection    Past Surgical History:  Procedure Laterality Date   CESAREAN SECTION     UPPER GASTROINTESTINAL ENDOSCOPY     VAGINAL HYSTERECTOMY  12/23/2004   Fibroids, menorrhagia, benign pathology   Patient Active Problem List   Diagnosis Date Noted   Severe recurrent major depressive disorder with psychotic features ([place]) 12/27/2022   Primary insomnia 12/27/2022   Morbid obesity ([place]) 04/04/2022   Former smoker 04/30/2021   Major depressive disorder, recurrent episode, moderate ([place]) 04/30/2021   Substance induced mood disorder ([place]) 03/22/2021   Generalized anxiety disorder 03/22/2021   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   DKA (diabetic ketoacidosis) ([place]) 01/23/2021   Glaucoma suspect 04/12/2020   DKA, type 2, not at goal [facility]) 10/02/2019   AKI (acute kidney injury) ([place]) 10/02/2019   Hyperkalemia 10/02/2019   Leukocytosis 10/02/2019   Abnormal LFTs 10/02/2019   Stressful life events affecting family and household 08/06/2019   New onset type 2 diabetes mellitus ([place]) 02/04/2019   Obesity (BMI 35.0-39.9  without comorbidity) 02/04/2019   Tobacco dependence 02/04/2019   Left arm weakness 12/06/2014   Paresthesias/numbness 12/06/2014   Tobacco abuse 11/14/2014   Depression    Mixed incontinence 06/27/2014   History of TVH in 2006 for fibroids and menorrhagia; benign pathology 09/08/2011    REFERRING DIAG: M72.2 (ICD-10-CM) - Plantar fasciitis, bilateral    THERAPY DIAG:  Pain in right foot  Pain in left foot  Muscle weakness (generalized)  Rationale for Evaluation and Treatment Rehabilitation  PERTINENT HISTORY: Anxiety, Depression, DMII   PRECAUTIONS: None  SUBJECTIVE:  SUBJECTIVE STATEMENT:  Pt presents to PT with reports of continued pain and discomfort. Has been compliant with HEP with no adverse effect. She is ready to begin PT at this time.   PAIN:  Are you having pain?  Yes: NPRS scale: 9/10 Worst: 10/10 Pain location: bilateral heels Pain description: sharp Aggravating factors: stairs, first step out of bed Relieving factors: none   OBJECTIVE: (objective measures completed at initial evaluation unless otherwise dated)   DIAGNOSTIC FINDINGS:             See imaging    PATIENT SURVEYS:  FOTO: 28% function; 52% predicted   COGNITION: Overall cognitive status: Within functional limits for tasks assessed                         SENSATION: Not tested   POSTURE: rounded shoulders, forward head, and larger body habitus   PALPATION: TTP to bilateral heels, bilateral gastroc   LOWER EXTREMITY ROM:   Active ROM Right eval Left eval Right 01/16/23 Left 01/16/23  Hip flexion        Hip extension        Hip abduction        Hip adduction        Hip internal rotation        Hip external rotation        Knee flexion        Knee extension        Ankle dorsiflexion -5 -10 -1  -8  Ankle plantarflexion        Ankle inversion        Ankle eversion         (Blank rows = not tested)   LOWER EXTREMITY MMT:   MMT Right eval Left eval  Hip flexion      Hip extension      Hip abduction      Hip adduction      Hip internal rotation      Hip external rotation      Knee flexion      Knee extension      Ankle dorsiflexion      Ankle plantarflexion 4/5 3+/5 p!  Ankle inversion      Ankle eversion       (Blank rows = not tested)   LOWER EXTREMITY SPECIAL TESTS:  DNT   FUNCTIONAL TESTS:  Five Time Sit to Stand: 22 seconds SLS: 5" R  5" L   GAIT: Distance walked: 67f Assistive device utilized: None Level of assistance: Complete Independence Comments: antalgica gait L, decreased heel strike   TREATMENT: [facility] Adult PT Treatment:                                                DATE: 01/30/2023 Therapeutic Exercise: Nustep level 6 x 5 mins Slant board gastroc stretch x 60" Slant board soleus stretch x 60" Heel toe raises x 20 Tandem on foam 2x30" each Mini squat on foam 2x10 Tandem walking on Airex with single UE support x3 laps BAPS L3 x 10 cw/ccw each STS 2x10  Manual Therapy: STM/MTPR BIL gastroc  [facility] Adult PT Treatment:  DATE: 01/28/2023 Therapeutic Exercise: Nustep level 6 x 5 mins Slant board gastroc stretch x 60" Slant board soleus stretch x 60" Standing heel raises with tennis ball btw heels x 20 Tandem on foam 2x30" each Mini squat on foam 2x10 Tandem walking on Airex with single UE support x3 laps Long sitting ankle 4-way 2x10 RTB Seated heel raise 2x15 10# DB Manual Therapy: STM/MTPR BIL gastroc  [facility] Adult PT Treatment:                                                DATE: 01/21/2023 Therapeutic Exercise: Nustep level 6 x 5 mins Slant board gastroc stretch x2' Slant board soleus stretch x2' Standing heel raises toes on Airex 2x10 Standing heel raises with tennis ball btw heels  2x15 Neuromuscular re-ed: FT on Airex with head turns x30" Semi tandem on Airex x30" BIL Tandem walking on Airex with single UE support x3 laps Manual Therapy: STM/MTPR BIL gastroc  [facility] Adult PT Treatment:                                                DATE: 01/16/2023 Therapeutic Exercise: Nustep level 6 x 5 mins Slant board gastroc stretch x2' Slant board soleus stretch x2' Standing heel raises toes on Airex 2x10 Standing heel raises with tennis ball btw heels 2x15 Ankle 4-way 2x10 RTB Neuromuscular re-ed: Heel Toe raises on foam x 15  FT on Airex with head turns x30" Semi tandem on Airex x30" BIL Manual Therapy: STM/MTPR BIL gastroc  [facility] Adult PT Treatment:                                                DATE: 01/14/2023 Therapeutic Exercise: Nustep level 6 x 5 mins Slant board gastroc stretch x2' Slant board soleus stretch x2' Standing heel raises toes on Airex 2x10 Standing heel raises with tennis ball btw heels 2x10 Standing toe raises 2x10 using wall Rocker board PF/DF x 60" Seated heel raises with 15# KB on 2" step 2x10 Neuromuscular re-ed: FT on Airex x30"  FT on Airex with head turns x30" Semi tandem on Airex x30" BIL Manual Therapy: STM/MTPR BIL gastroc Tiger tail BIL gastroc  PATIENT EDUCATION:  Education details: eval findings, FOTO, HEP, POC Person educated: Patient Education method: Explanation, Demonstration, and Handouts Education comprehension: verbalized understanding and returned demonstration   HOME EXERCISE PROGRAM: Access Code: [facility] URL: [email or web address] Date: 12/11/2022 Prepared by: Octavio Manns   Exercises - Long Sitting Calf Stretch with Strap  - 2 x daily - 7 x weekly - 2-3 reps - 30 sec hold - Towel Scrunches  - 2 x daily - 7 x weekly - 2 reps - 60 sec hold - Seated Plantar Fascia Mobilization with Small Ball  - 2 x daily - 7 x weekly - 3 sets - 10 reps - Ankle and Toe Plantarflexion with Resistance  - 2 x  daily - 7 x weekly - 3 sets - 10 reps - blue theraband hold   ASSESSMENT:   CLINICAL IMPRESSION: Pt able to complete all prescribed exercises with no  adverse effect. Therapy focused on distal LE strengthening and balance. Responded well to manual therapy interventions, noting decreased pain post session. Pt is progressing with therapy, will continue per POC.          OBJECTIVE IMPAIRMENTS: Abnormal gait, decreased activity tolerance, decreased mobility, difficulty walking, decreased ROM, decreased strength, and pain.    ACTIVITY LIMITATIONS: standing, squatting, stairs, transfers, and locomotion level   PARTICIPATION LIMITATIONS: meal prep, cleaning, laundry, driving, community activity, and yard work   PERSONAL FACTORS: Fitness and 3+ comorbidities: Anxiety, Depression, DMII  are also affecting patient's functional outcome.      GOALS: Goals reviewed with patient? No   SHORT TERM GOALS: Target date: 01/01/2023   Pt will be compliant and knowledgeable with initial HEP for improved comfort and carryover Baseline: initial HEP given  Goal status: Ongoing Patient reports sporadic compliance 01/07/23   2.  Pt will self report bilateral heel pain no greater than 6/10 for improved comfort and functional ability Baseline: 10/10 at worst Goal status: Ongoing Pt reports 10/10 at worst 01/07/23   LONG TERM GOALS: Target date: 02/05/2023   Pt will self report bilateral heel pain no greater than 3/10 for improved comfort and functional ability Baseline: 10/10 at worst Goal status: INITIAL    2.  Pt will improve FOTO function score to no less than 52% as proxy for functional improvement Baseline: 28% function Goal status: INITIAL    3.  Pt will improve Five Time Sit to Stand to no greater than 15 seconds for improved balance and functional mobility Baseline: 22 seconds with pain Goal status: INITIAL   4.  Pt will improve bilateral ankle DF to no less than 5 for improved gait and  decreased pain  Baseline: see chart Goal status: INITIAL   5.  Pt will improve bilateral SLS time to no less than 15 seconds for improved balance and ankle stability Baseline: 4" R  2" L - (L painful) Goal status: INITIAL     PLAN:   PT FREQUENCY: 2x/week   PT DURATION: 8 weeks   PLANNED INTERVENTIONS: Therapeutic exercises, Therapeutic activity, Neuromuscular re-education, Balance training, Gait training, Patient/Family education, Self Care, Joint mobilization, Aquatic Therapy, Dry Needling, Electrical stimulation, Cryotherapy, Moist heat, Manual therapy, and Re-evaluation   PLAN FOR NEXT SESSION: assess HEP response, ankle strengthening, calf stretching, TPDN?   Ward Chatters, PT 01/30/2023, 6:26 PM

## 2023-01-30 NOTE — Telephone Encounter (Signed)
Patient informed Probation officer that she continues to have poor sleep.  She also notes that she continues to have auditory and visual hallucinations.  Patient notes that her depression is well-managed.  She notes at times she is anxious about life stressors but reports that she is able to cope with it.  Today she is agreeable to increasing Zyprexa 5 mg to 15 mg to help manage sleep and symptoms of psychosis.  She will continue her other medications as prescribed.  No other concerns at this time.

## 2023-01-31 ENCOUNTER — Ambulatory Visit: Payer: Commercial Managed Care - HMO | Admitting: Internal Medicine

## 2023-02-04 ENCOUNTER — Ambulatory Visit (INDEPENDENT_AMBULATORY_CARE_PROVIDER_SITE_OTHER): Payer: Medicaid Other | Admitting: Clinical

## 2023-02-04 ENCOUNTER — Telehealth: Payer: Self-pay

## 2023-02-04 ENCOUNTER — Ambulatory Visit: Payer: Medicaid Other

## 2023-02-04 DIAGNOSIS — F331 Major depressive disorder, recurrent, moderate: Secondary | ICD-10-CM

## 2023-02-04 NOTE — Telephone Encounter (Signed)
Spoke with patient regarding missed appointment. Confirmed next appointment time.  1st no-show  Whitney Benton, [place] 02/04/23 5:19 PM

## 2023-02-04 NOTE — Therapy (Incomplete)
OUTPATIENT PHYSICAL THERAPY TREATMENT NOTE   Patient Name: Whitney Benton MRN: [id number] DOB:19-Aug-1971, 52 y.o., female Today's Date: 02/04/2023  PCP: Ladell Pier, MD  REFERRING PROVIDER: Yevonne Pax, DPM   END OF SESSION:          Past Medical History:  Diagnosis Date   Allergy    Anxiety    Arthritis    Asthma    Depression    Diabetes mellitus without complication ([place])    GERD (gastroesophageal reflux disease)    Glaucoma suspect    Hyperlipidemia    Trichomonas infection    Past Surgical History:  Procedure Laterality Date   CESAREAN SECTION     UPPER GASTROINTESTINAL ENDOSCOPY     VAGINAL HYSTERECTOMY  12/23/2004   Fibroids, menorrhagia, benign pathology   Patient Active Problem List   Diagnosis Date Noted   Severe recurrent major depressive disorder with psychotic features ([place]) 12/27/2022   Primary insomnia 12/27/2022   Morbid obesity ([place]) 04/04/2022   Former smoker 04/30/2021   Major depressive disorder, recurrent episode, moderate ([place]) 04/30/2021   Substance induced mood disorder ([place]) 03/22/2021   Generalized anxiety disorder 03/22/2021   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   DKA (diabetic ketoacidosis) ([place]) 01/23/2021   Glaucoma suspect 04/12/2020   DKA, type 2, not at goal [facility]) 10/02/2019   AKI (acute kidney injury) ([place]) 10/02/2019   Hyperkalemia 10/02/2019   Leukocytosis 10/02/2019   Abnormal LFTs 10/02/2019   Stressful life events affecting family and household 08/06/2019   New onset type 2 diabetes mellitus ([place]) 02/04/2019   Obesity (BMI 35.0-39.9 without comorbidity) 02/04/2019   Tobacco dependence 02/04/2019   Left arm weakness 12/06/2014   Paresthesias/numbness 12/06/2014   Tobacco abuse 11/14/2014   Depression    Mixed incontinence 06/27/2014   History of TVH in 2006 for fibroids and menorrhagia; benign pathology 09/08/2011    REFERRING DIAG: M72.2 (ICD-10-CM) - Plantar fasciitis,  bilateral    THERAPY DIAG:  No diagnosis found.  Rationale for Evaluation and Treatment Rehabilitation  PERTINENT HISTORY: Anxiety, Depression, DMII   PRECAUTIONS: None  SUBJECTIVE:                                                                                                                                                                                      SUBJECTIVE STATEMENT:  ***  PAIN:  Are you having pain?  Yes: NPRS scale: 9/10 Worst: 10/10 Pain location: bilateral heels Pain description: sharp Aggravating factors: stairs, first step out of bed Relieving factors: none   OBJECTIVE: (objective measures completed at initial evaluation unless otherwise dated)   DIAGNOSTIC FINDINGS:  See imaging    PATIENT SURVEYS:  FOTO: 28% function; 52% predicted   COGNITION: Overall cognitive status: Within functional limits for tasks assessed                         SENSATION: Not tested   POSTURE: rounded shoulders, forward head, and larger body habitus   PALPATION: TTP to bilateral heels, bilateral gastroc   LOWER EXTREMITY ROM:   Active ROM Right eval Left eval Right 01/16/23 Left 01/16/23  Hip flexion        Hip extension        Hip abduction        Hip adduction        Hip internal rotation        Hip external rotation        Knee flexion        Knee extension        Ankle dorsiflexion -5 -10 -1 -8  Ankle plantarflexion        Ankle inversion        Ankle eversion         (Blank rows = not tested)   LOWER EXTREMITY MMT:   MMT Right eval Left eval  Hip flexion      Hip extension      Hip abduction      Hip adduction      Hip internal rotation      Hip external rotation      Knee flexion      Knee extension      Ankle dorsiflexion      Ankle plantarflexion 4/5 3+/5 p!  Ankle inversion      Ankle eversion       (Blank rows = not tested)   LOWER EXTREMITY SPECIAL TESTS:  DNT   FUNCTIONAL TESTS:  Five Time Sit to Stand: 22  seconds SLS: 5" R  5" L   GAIT: Distance walked: 73f Assistive device utilized: None Level of assistance: Complete Independence Comments: antalgica gait L, decreased heel strike   TREATMENT: [facility] Adult PT Treatment:                                                DATE: 02/04/2023 Therapeutic Exercise: Nustep level 6 x 5 mins Slant board gastroc stretch x 60" Slant board soleus stretch x 60" Heel toe raises x 20 Tandem on foam 2x30" each Mini squat on foam 2x10 Tandem walking on Airex with single UE support x3 laps BAPS L3 x 10 cw/ccw each STS 2x10  Manual Therapy: STM/MTPR BIL gastroc  [facility] Adult PT Treatment:                                                DATE: 01/30/2023 Therapeutic Exercise: Nustep level 6 x 5 mins Slant board gastroc stretch x 60" Slant board soleus stretch x 60" Heel toe raises x 20 Tandem on foam 2x30" each Mini squat on foam 2x10 Tandem walking on Airex with single UE support x3 laps BAPS L3 x 10 cw/ccw each STS 2x10  Manual Therapy: STM/MTPR BIL gastroc  [facility] Adult PT Treatment:  DATE: 01/28/2023 Therapeutic Exercise: Nustep level 6 x 5 mins Slant board gastroc stretch x 60" Slant board soleus stretch x 60" Standing heel raises with tennis ball btw heels x 20 Tandem on foam 2x30" each Mini squat on foam 2x10 Tandem walking on Airex with single UE support x3 laps Long sitting ankle 4-way 2x10 RTB Seated heel raise 2x15 10# DB Manual Therapy: STM/MTPR BIL gastroc  [facility] Adult PT Treatment:                                                DATE: 01/21/2023 Therapeutic Exercise: Nustep level 6 x 5 mins Slant board gastroc stretch x2' Slant board soleus stretch x2' Standing heel raises toes on Airex 2x10 Standing heel raises with tennis ball btw heels 2x15 Neuromuscular re-ed: FT on Airex with head turns x30" Semi tandem on Airex x30" BIL Tandem walking on Airex with single UE support x3 laps Manual  Therapy: STM/MTPR BIL gastroc  [facility] Adult PT Treatment:                                                DATE: 01/16/2023 Therapeutic Exercise: Nustep level 6 x 5 mins Slant board gastroc stretch x2' Slant board soleus stretch x2' Standing heel raises toes on Airex 2x10 Standing heel raises with tennis ball btw heels 2x15 Ankle 4-way 2x10 RTB Neuromuscular re-ed: Heel Toe raises on foam x 15  FT on Airex with head turns x30" Semi tandem on Airex x30" BIL Manual Therapy: STM/MTPR BIL gastroc  [facility] Adult PT Treatment:                                                DATE: 01/14/2023 Therapeutic Exercise: Nustep level 6 x 5 mins Slant board gastroc stretch x2' Slant board soleus stretch x2' Standing heel raises toes on Airex 2x10 Standing heel raises with tennis ball btw heels 2x10 Standing toe raises 2x10 using wall Rocker board PF/DF x 60" Seated heel raises with 15# KB on 2" step 2x10 Neuromuscular re-ed: FT on Airex x30"  FT on Airex with head turns x30" Semi tandem on Airex x30" BIL Manual Therapy: STM/MTPR BIL gastroc Tiger tail BIL gastroc  PATIENT EDUCATION:  Education details: eval findings, FOTO, HEP, POC Person educated: Patient Education method: Explanation, Demonstration, and Handouts Education comprehension: verbalized understanding and returned demonstration   HOME EXERCISE PROGRAM: Access Code: [facility] URL: [email or web address] Date: 12/11/2022 Prepared by: Octavio Manns   Exercises - Long Sitting Calf Stretch with Strap  - 2 x daily - 7 x weekly - 2-3 reps - 30 sec hold - Towel Scrunches  - 2 x daily - 7 x weekly - 2 reps - 60 sec hold - Seated Plantar Fascia Mobilization with Small Ball  - 2 x daily - 7 x weekly - 3 sets - 10 reps - Ankle and Toe Plantarflexion with Resistance  - 2 x daily - 7 x weekly - 3 sets - 10 reps - blue theraband hold   ASSESSMENT:   CLINICAL IMPRESSION: ***    OBJECTIVE IMPAIRMENTS: Abnormal gait, decreased  activity tolerance, decreased mobility, difficulty walking, decreased ROM, decreased strength, and pain.    ACTIVITY LIMITATIONS: standing, squatting, stairs, transfers, and locomotion level   PARTICIPATION LIMITATIONS: meal prep, cleaning, laundry, driving, community activity, and yard work   PERSONAL FACTORS: Fitness and 3+ comorbidities: Anxiety, Depression, DMII  are also affecting patient's functional outcome.      GOALS: Goals reviewed with patient? No   SHORT TERM GOALS: Target date: 01/01/2023   Pt will be compliant and knowledgeable with initial HEP for improved comfort and carryover Baseline: initial HEP given  Goal status: Ongoing Patient reports sporadic compliance 01/07/23   2.  Pt will self report bilateral heel pain no greater than 6/10 for improved comfort and functional ability Baseline: 10/10 at worst Goal status: Ongoing Pt reports 10/10 at worst 01/07/23   LONG TERM GOALS: Target date: 02/05/2023   Pt will self report bilateral heel pain no greater than 3/10 for improved comfort and functional ability Baseline: 10/10 at worst Goal status: INITIAL    2.  Pt will improve FOTO function score to no less than 52% as proxy for functional improvement Baseline: 28% function Goal status: INITIAL    3.  Pt will improve Five Time Sit to Stand to no greater than 15 seconds for improved balance and functional mobility Baseline: 22 seconds with pain Goal status: INITIAL   4.  Pt will improve bilateral ankle DF to no less than 5 for improved gait and decreased pain  Baseline: see chart Goal status: INITIAL   5.  Pt will improve bilateral SLS time to no less than 15 seconds for improved balance and ankle stability Baseline: 4" R  2" L - (L painful) Goal status: INITIAL     PLAN:   PT FREQUENCY: 2x/week   PT DURATION: 8 weeks   PLANNED INTERVENTIONS: Therapeutic exercises, Therapeutic activity, Neuromuscular re-education, Balance training, Gait training,  Patient/Family education, Self Care, Joint mobilization, Aquatic Therapy, Dry Needling, Electrical stimulation, Cryotherapy, Moist heat, Manual therapy, and Re-evaluation   PLAN FOR NEXT SESSION: assess HEP response, ankle strengthening, calf stretching, TPDN?   Ward Chatters, PT 02/04/2023, 11:02 AM

## 2023-02-04 NOTE — Progress Notes (Signed)
   THERAPIST PROGRESS NOTE  Session Time: 30 minutes  Participation Level: Active  Behavioral Response: CasualAlertDepressed  Type of Therapy: Individual Therapy  Treatment Goals addressed: client will complete 80% of homework  ProgressTowards Goals: Progressing  Interventions: CBT and Supportive  Summary:  Whitney Benton is a 52 y.o. female who presents for the scheduled appointment oriented times five, appropriately dressed, and friendly. Client denied hallucinations and delusions. Client reported on today she is doing fairly okay. Client reported since she was last seen her sister now has a part time job. Client reported her hopes is that her sister will be moving out closer to half way through the year. Client reported she went to her medical doctor and they updated her diagnosis from type 2 diabetes to a type 1 diagnosis. Client reported they will probably give her a pump to use. Client reported she is working on making dietary changes. Client reported waiting for her new grandchild she is wanting the responsibility to fall on her son and his girlfriend. Client reported watching her grandson alone is a lot. Client reported the psychiatrist gave her a new prescription to hopefully help her sleep. Client reported she des not fall asleep until 11pm. Evidence of progress towards goal:   client reported medication compliance 7 days per week.    Suicidal/Homicidal: Nowithout intent/plan  Therapist Response:  Therapist began the appointment asking the client how she has been doing since last seen. Therapist used CBT to engage using active listening and positive emotional support. Therapist used CBT to ask the client how she has been doing emotionally and problem solving stressors. Therapist used CBT to engage and ask the client how she feels about her changes to health. Therapist used CBT to reinforce the clients use of psych medications as prescribed. Therapist used CBT ask the client to  identify her progress with frequency of use with coping skills with continued practice in her daily activity.    Therapist assigned the client homework to practice self care.   Plan: Return again in 3 weeks.  Diagnosis: major depressive disorder, recurrent episode, moderate  Collaboration of Care: Patient refused AEB none requested by the client.   Patient/Guardian was advised Release of Information must be obtained prior to any record release in order to collaborate their care with an outside provider. Patient/Guardian was advised if they have not already done so to contact the registration department to sign all necessary forms in order for [place] to release information regarding their care.   Consent: Patient/Guardian gives verbal consent for treatment and assignment of benefits for services provided during this visit. Patient/Guardian expressed understanding and agreed to proceed.   [address], LCSW 02/04/2023

## 2023-02-06 ENCOUNTER — Ambulatory Visit (INDEPENDENT_AMBULATORY_CARE_PROVIDER_SITE_OTHER): Payer: Medicaid Other | Admitting: Podiatry

## 2023-02-06 ENCOUNTER — Ambulatory Visit: Payer: Medicaid Other

## 2023-02-06 DIAGNOSIS — M79672 Pain in left foot: Secondary | ICD-10-CM

## 2023-02-06 DIAGNOSIS — M6281 Muscle weakness (generalized): Secondary | ICD-10-CM

## 2023-02-06 DIAGNOSIS — M722 Plantar fascial fibromatosis: Secondary | ICD-10-CM | POA: Diagnosis not present

## 2023-02-06 DIAGNOSIS — R2689 Other abnormalities of gait and mobility: Secondary | ICD-10-CM

## 2023-02-06 DIAGNOSIS — M79671 Pain in right foot: Secondary | ICD-10-CM | POA: Diagnosis not present

## 2023-02-06 DIAGNOSIS — M7750 Other enthesopathy of unspecified foot: Secondary | ICD-10-CM

## 2023-02-06 MED ORDER — MELOXICAM 15 MG PO TABS: 15.0000 mg | ORAL_TABLET | Freq: Every day | ORAL | 0 refills | Status: DC

## 2023-02-06 NOTE — Progress Notes (Signed)
  Subjective:  Patient ID: Whitney Benton, female    DOB: [birth year],  MRN: [id number]  Chief Complaint  Patient presents with   Follow-up    Patient states that she is still having some pain. 9.5/10 pain level.     52 y.o. female presents for follow up on bilateral PF.  Patient states she is having still pain in the bilateral heel.  Not much better from prior.  She says she is wearing her inserts however she does not have good supportive shoes on when she came in visit today.  Review of Systems: Negative except as noted in the HPI. Denies N/V/F/Ch.   Objective:  There were no vitals filed for this visit. There is no height or weight on file to calculate BMI. Constitutional Well developed. Well nourished.  Vascular Dorsalis pedis pulses palpable bilaterally. Posterior tibial pulses palpable bilaterally. Capillary refill normal to all digits.  No cyanosis or clubbing noted. Pedal hair growth normal.  Neurologic Normal speech. Oriented to person, place, and time. Epicritic sensation to light touch grossly present bilaterally.  Dermatologic Nails well groomed and normal in appearance. No open wounds. No skin lesions.  Orthopedic: Normal joint ROM without pain or crepitus bilaterally. No visible deformities. Tender to palpation at the calcaneal tuber bilaterally. No pain with calcaneal squeeze bilaterally. Ankle ROM diminished range of motion bilaterally. Silfverskiold Test: deferred bilaterally.   Radiographs: Taken and reviewed. No acute fractures or dislocations. No evidence of stress fracture.  Plantar heel spur absent. Posterior heel spur absent.   Assessment:   1. Plantar fasciitis, bilateral   2. Tendonitis of ankle or foot       Plan:  Patient was evaluated and treated and all questions answered.  Plantar Fasciitis, bilaterally - XR reviewed as above.  - Educated on icing and stretching. Instructions given.  - Injection delivered to the plantar fascia as  below. - DME: Continue power step orthotics, continue stressed the importance of wearing the inserts as well as good supportive shoes to prevent stress on the plantar fascia when weightbearing. - Pharmacologic management: Meloxicam 15 mg takes once daily as needed for pain inflammation for the next 3 days. Educated on risks/benefits and proper taking of medication.  Procedure: Injection Tendon/Ligament Location: Bilateral plantar fascia at the glabrous junction; medial approach. Skin Prep: alcohol Injectate: 1 cc 0.5% marcaine plain, 1 cc kenalog 10. Disposition: Patient tolerated procedure well. Injection site dressed with a band-aid.   Return in about 8 weeks (around 04/03/2023) for f/u biateral PF.

## 2023-02-06 NOTE — Therapy (Signed)
OUTPATIENT PHYSICAL THERAPY TREATMENT NOTE/Re-Cert   Patient Name: Whitney Benton MRN: [id number] DOB:1971-01-27, 52 y.o., female Today's Date: 02/06/2023  PCP: Ladell Pier, MD  REFERRING PROVIDER: Yevonne Pax, DPM   END OF SESSION:   PT End of Session - 02/06/23 1920     Visit Number 11    Number of Visits 17    Date for PT Re-Evaluation 03/06/23    Authorization Type [employer]    PT Start Time 1748    PT Stop Time 1818    PT Time Calculation (min) 30 min    Activity Tolerance Patient tolerated treatment well    Behavior During Therapy WFL for tasks assessed/performed                   Past Medical History:  Diagnosis Date   Allergy    Anxiety    Arthritis    Asthma    Depression    Diabetes mellitus without complication ([place])    GERD (gastroesophageal reflux disease)    Glaucoma suspect    Hyperlipidemia    Trichomonas infection    Past Surgical History:  Procedure Laterality Date   CESAREAN SECTION     UPPER GASTROINTESTINAL ENDOSCOPY     VAGINAL HYSTERECTOMY  12/23/2004   Fibroids, menorrhagia, benign pathology   Patient Active Problem List   Diagnosis Date Noted   Severe recurrent major depressive disorder with psychotic features ([place]) 12/27/2022   Primary insomnia 12/27/2022   Morbid obesity ([place]) 04/04/2022   Former smoker 04/30/2021   Major depressive disorder, recurrent episode, moderate ([place]) 04/30/2021   Substance induced mood disorder ([place]) 03/22/2021   Generalized anxiety disorder 03/22/2021   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   DKA (diabetic ketoacidosis) ([place]) 01/23/2021   Glaucoma suspect 04/12/2020   DKA, type 2, not at goal [facility]) 10/02/2019   AKI (acute kidney injury) ([place]) 10/02/2019   Hyperkalemia 10/02/2019   Leukocytosis 10/02/2019   Abnormal LFTs 10/02/2019   Stressful life events affecting family and household 08/06/2019   New onset type 2 diabetes mellitus ([place]) 02/04/2019   Obesity  (BMI 35.0-39.9 without comorbidity) 02/04/2019   Tobacco dependence 02/04/2019   Left arm weakness 12/06/2014   Paresthesias/numbness 12/06/2014   Tobacco abuse 11/14/2014   Depression    Mixed incontinence 06/27/2014   History of TVH in 2006 for fibroids and menorrhagia; benign pathology 09/08/2011    REFERRING DIAG: M72.2 (ICD-10-CM) - Plantar fasciitis, bilateral    THERAPY DIAG:  Pain in right foot - Plan: PT plan of care cert/re-cert  Pain in left foot - Plan: PT plan of care cert/re-cert  Muscle weakness (generalized) - Plan: PT plan of care cert/re-cert  Other abnormalities of gait and mobility - Plan: PT plan of care cert/re-cert  Rationale for Evaluation and Treatment Rehabilitation  PERTINENT HISTORY: Anxiety, Depression, DMII   PRECAUTIONS: None  SUBJECTIVE:  SUBJECTIVE STATEMENT:  Pt presents to PT with continued pain in heels. Had a corticosteroid injection today in bilateral feet. Feels like she is getting better and stronger with therapy.  PAIN:  Are you having pain?  Yes: NPRS scale: 10/10 Worst: 10/10 Pain location: bilateral heels Pain description: sharp Aggravating factors: stairs, first step out of bed Relieving factors: none   OBJECTIVE: (objective measures completed at initial evaluation unless otherwise dated)   DIAGNOSTIC FINDINGS:             See imaging    PATIENT SURVEYS:  FOTO: 38% function; 52% predicted - 02/06/23   COGNITION: Overall cognitive status: Within functional limits for tasks assessed                         SENSATION: Not tested   POSTURE: rounded shoulders, forward head, and larger body habitus   PALPATION: TTP to bilateral heels, bilateral gastroc   LOWER EXTREMITY ROM:   Active ROM Right eval Left eval Right 01/16/23 Left 01/16/23  Right 02/06/23 Left 02/06/23  Hip flexion          Hip extension          Hip abduction          Hip adduction          Hip internal rotation          Hip external rotation          Knee flexion          Knee extension          Ankle dorsiflexion -5 -10 -1 -8 0 -8  Ankle plantarflexion          Ankle inversion          Ankle eversion           (Blank rows = not tested)   LOWER EXTREMITY MMT:   MMT Right eval Left eval  Hip flexion      Hip extension      Hip abduction      Hip adduction      Hip internal rotation      Hip external rotation      Knee flexion      Knee extension      Ankle dorsiflexion      Ankle plantarflexion 4/5 3+/5 p!  Ankle inversion      Ankle eversion       (Blank rows = not tested)   LOWER EXTREMITY SPECIAL TESTS:  DNT   FUNCTIONAL TESTS:  Five Time Sit to Stand: 16 seconds - 02/06/23 SLS: 5" R  5" L   GAIT: Distance walked: 2f Assistive device utilized: None Level of assistance: Complete Independence Comments: antalgica gait L, decreased heel strike   [place] [facility]                                                DATE: 02/06/2023 Therapeutic Activity: Assessment of tests/measures, goals, and outcomes for recert Manual Therapy: STM/MTPR BIL gastroc  [facility] Adult PT Treatment:  DATE: 01/30/2023 Therapeutic Exercise: Nustep level 6 x 5 mins Slant board gastroc stretch x 60" Slant board soleus stretch x 60" Heel toe raises x 20 Tandem on foam 2x30" each Mini squat on foam 2x10 Tandem walking on Airex with single UE support x3 laps BAPS L3 x 10 cw/ccw each STS 2x10  Manual Therapy: STM/MTPR BIL gastroc  [facility] Adult PT Treatment:                                                DATE: 01/28/2023 Therapeutic Exercise: Nustep level 6 x 5 mins Slant board gastroc stretch x 60" Slant board soleus stretch x 60" Standing heel raises with tennis ball btw heels x  20 Tandem on foam 2x30" each Mini squat on foam 2x10 Tandem walking on Airex with single UE support x3 laps Long sitting ankle 4-way 2x10 RTB Seated heel raise 2x15 10# DB Manual Therapy: STM/MTPR BIL gastroc  [facility] Adult PT Treatment:                                                DATE: 01/21/2023 Therapeutic Exercise: Nustep level 6 x 5 mins Slant board gastroc stretch x2' Slant board soleus stretch x2' Standing heel raises toes on Airex 2x10 Standing heel raises with tennis ball btw heels 2x15 Neuromuscular re-ed: FT on Airex with head turns x30" Semi tandem on Airex x30" BIL Tandem walking on Airex with single UE support x3 laps Manual Therapy: STM/MTPR BIL gastroc  [facility] Adult PT Treatment:                                                DATE: 01/16/2023 Therapeutic Exercise: Nustep level 6 x 5 mins Slant board gastroc stretch x2' Slant board soleus stretch x2' Standing heel raises toes on Airex 2x10 Standing heel raises with tennis ball btw heels 2x15 Ankle 4-way 2x10 RTB Neuromuscular re-ed: Heel Toe raises on foam x 15  FT on Airex with head turns x30" Semi tandem on Airex x30" BIL Manual Therapy: STM/MTPR BIL gastroc  [facility] Adult PT Treatment:                                                DATE: 01/14/2023 Therapeutic Exercise: Nustep level 6 x 5 mins Slant board gastroc stretch x2' Slant board soleus stretch x2' Standing heel raises toes on Airex 2x10 Standing heel raises with tennis ball btw heels 2x10 Standing toe raises 2x10 using wall Rocker board PF/DF x 60" Seated heel raises with 15# KB on 2" step 2x10 Neuromuscular re-ed: FT on Airex x30"  FT on Airex with head turns x30" Semi tandem on Airex x30" BIL Manual Therapy: STM/MTPR BIL gastroc Tiger tail BIL gastroc  PATIENT EDUCATION:  Education details: eval findings, FOTO, HEP, POC Person educated: Patient Education method: Explanation, Demonstration, and Handouts Education comprehension: verbalized  understanding and returned demonstration   HOME EXERCISE PROGRAM: Access Code: [facility] URL: [email or web address] Date: 12/11/2022 Prepared  by: Octavio Manns   Exercises - Long Sitting Calf Stretch with Strap  - 2 x daily - 7 x weekly - 2-3 reps - 30 sec hold - Towel Scrunches  - 2 x daily - 7 x weekly - 2 reps - 60 sec hold - Seated Plantar Fascia Mobilization with Small Ball  - 2 x daily - 7 x weekly - 3 sets - 10 reps - Ankle and Toe Plantarflexion with Resistance  - 2 x daily - 7 x weekly - 3 sets - 10 reps - blue theraband hold   ASSESSMENT:   CLINICAL IMPRESSION: Pt tolerated treatment well which focused on manual and goal testing as she had corticosteroid injections today with PT not wanting to stress area of injection. Over the course of PT she has improved her functional mobility and calf length, particularly on R LE. She notes subjective functional improvement with increased FOTO score. PT will continue to progress as tolerated per POC.    OBJECTIVE IMPAIRMENTS: Abnormal gait, decreased activity tolerance, decreased mobility, difficulty walking, decreased ROM, decreased strength, and pain.    ACTIVITY LIMITATIONS: standing, squatting, stairs, transfers, and locomotion level   PARTICIPATION LIMITATIONS: meal prep, cleaning, laundry, driving, community activity, and yard work   PERSONAL FACTORS: Fitness and 3+ comorbidities: Anxiety, Depression, DMII  are also affecting patient's functional outcome.      GOALS: Goals reviewed with patient? No   SHORT TERM GOALS: Target date: 01/01/2023   Pt will be compliant and knowledgeable with initial HEP for improved comfort and carryover Baseline: initial HEP given  Goal status: MET   2.  Pt will self report bilateral heel pain no greater than 6/10 for improved comfort and functional ability Baseline: 10/10 at worst Goal status: Ongoing Pt reports 10/10 at worst 01/07/23   LONG TERM GOALS: Target date: 03/06/2023    Pt will self report bilateral heel pain no greater than 3/10 for improved comfort and functional ability Baseline: 10/10 at worst Goal status: ONGOING   2.  Pt will improve FOTO function score to no less than 52% as proxy for functional improvement Baseline: 28% function 02/06/2023: 38% function Goal status: ONGOING   3.  Pt will improve Five Time Sit to Stand to no greater than 15 seconds for improved balance and functional mobility Baseline: 22 seconds with pain 02/06/2023: 16 seconds Goal status: PROGRESSING   4.  Pt will improve bilateral ankle DF to no less than 5 for improved gait and decreased pain  Baseline: see chart Goal status: IONGOING   5.  Pt will improve bilateral SLS time to no less than 15 seconds for improved balance and ankle stability Baseline: 4" R  2" L - (L painful) Goal status: ONGOING     PLAN:   PT FREQUENCY: 2x/week   PT DURATION: 8 weeks   PLANNED INTERVENTIONS: Therapeutic exercises, Therapeutic activity, Neuromuscular re-education, Balance training, Gait training, Patient/Family education, Self Care, Joint mobilization, Aquatic Therapy, Dry Needling, Electrical stimulation, Cryotherapy, Moist heat, Manual therapy, and Re-evaluation   PLAN FOR NEXT SESSION: assess HEP response, ankle strengthening, calf stretching, TPDN?   Ward Chatters, PT 02/06/2023, 7:26 PM

## 2023-02-10 ENCOUNTER — Other Ambulatory Visit: Payer: Self-pay | Admitting: Family Medicine

## 2023-02-10 ENCOUNTER — Other Ambulatory Visit: Payer: Self-pay

## 2023-02-10 ENCOUNTER — Other Ambulatory Visit: Payer: Self-pay | Admitting: Pharmacist

## 2023-02-10 DIAGNOSIS — E1165 Type 2 diabetes mellitus with hyperglycemia: Secondary | ICD-10-CM

## 2023-02-10 DIAGNOSIS — E139 Other specified diabetes mellitus without complications: Secondary | ICD-10-CM | POA: Diagnosis not present

## 2023-02-10 MED ORDER — FREESTYLE LIBRE 2 SENSOR MISC
6 refills | Status: DC
  Filled 2023-02-10 – 2023-02-13 (×2): qty 2, 28d supply, fill #0

## 2023-02-11 ENCOUNTER — Other Ambulatory Visit: Payer: Self-pay

## 2023-02-11 NOTE — Progress Notes (Unsigned)
Cardiology Office Note:    Date:  02/13/2023   ID:  ELSY ATTANASIO, DOB [birth year], MRN [id number]  PCP:  Ladell Pier, MD   [facility] Providers Cardiologist:  Freada Bergeron, MD {  Referring MD: Terrilee Croak, PA-C    History of Present Illness:    DEZMARIAH BELO is a 52 y.o. female with a hx of DMII, HLD, obesity, former smoker and depression who returns to clinic for follow-up.  Was initially seen in clinic on 07/16/2021 for sinus tachycardia associated with mild SOB and DOE. Unfortunately, she did not have insurance and therefore we did not obtain monitor or echo at that time. We started her on metoprolol 12.44m BID. We recommended her for the orange card as well.  Fortunately, she as able to get insurance and we obtained a coronary CTA 01/2022 which showed mild plaque. Ca score 1 (83%).   Was last seen in clinic on 10/16/22 by VRobbie Liswhere she was having palpitations. Cardiac monitor 11/2022 with NSR, 1 run of NSVT and 1 run of SVT, rare ectopy. She was continued on the metoprolol at that time.  Today, the patient overall feels okay. Patient continues to have intermittent pain in her chest that occurs randomly with associated SOB. Episodes last 325m-1hr before abating. Palpitations overall improved on the metoprolol. No LE edema, PND, orthopnea. Remains very active looking after her grandson.  Past Medical History:  Diagnosis Date   Allergy    Anxiety    Arthritis    Asthma    Depression    Diabetes mellitus without complication (HCC)    GERD (gastroesophageal reflux disease)    Glaucoma suspect    Hyperlipidemia    Trichomonas infection     Past Surgical History:  Procedure Laterality Date   CESAREAN SECTION     UPPER GASTROINTESTINAL ENDOSCOPY     VAGINAL HYSTERECTOMY  12/23/2004   Fibroids, menorrhagia, benign pathology    Current Medications: Current Meds  Medication Sig   acetaminophen (TYLENOL) 500 MG tablet Take 1 tablet (500 mg total)  by mouth every 6 (six) hours as needed.   acetaminophen-codeine (TYLENOL #3) 300-30 MG tablet Take 1 tablet by mouth 2 (two) times daily as needed for moderate pain.   albuterol (VENTOLIN HFA) 108 (90 Base) MCG/ACT inhaler Inhale 2 puffs into the lungs every 6 (six) hours as needed for wheezing or shortness of breath (Cough).   amLODipine (NORVASC) 2.5 MG tablet Take 1 tablet (2.5 mg total) by mouth daily.   atorvastatin (LIPITOR) 40 MG tablet Take 1 tablet (40 mg total) by mouth daily.   Blood Glucose Monitoring Suppl (TRUE METRIX METER) w/Device KIT Check blood sugars three times a day   busPIRone (BUSPAR) 15 MG tablet Take 1 tablet (15 mg total) by mouth 3 (three) times daily.   Continuous Blood Gluc Receiver (FREESTYLE LIBRE 2 READER) DEVI Use to check blood sugar three times daily.   Continuous Blood Gluc Sensor (FREESTYLE LIBRE 2 SENSOR) MISC use as directed   cycloSPORINE (RESTASIS) 0.05 % ophthalmic emulsion Apply 1 drop into both eyes twice a day   doxepin (SINEQUAN) 25 MG capsule Take 1 capsule (25 mg total) by mouth at bedtime.   DULoxetine (CYMBALTA) 20 MG capsule Take 1 capsule (20 mg total) by mouth daily.   DULoxetine (CYMBALTA) 60 MG capsule Take 1 capsule (60 mg total) by mouth daily.   Erenumab-aooe (AIMOVIG) 140 MG/ML SOAJ Inject 140 mg into the skin every 28 (twenty-eight)  days.   Estradiol 10 MCG TABS vaginal tablet Insert one tab intravaginally 2 times a week   fluticasone (FLONASE) 50 MCG/ACT nasal spray Place 1 spray into both nostrils daily. Begin by using 2 sprays in each nare daily for 3 to 5 days, then decrease to 1 spray in each nare daily.   gabapentin (NEURONTIN) 300 MG capsule Take 1 capsule (300 mg total) by mouth 3 (three) times daily.   glucose blood (TRUE METRIX BLOOD GLUCOSE TEST) test strip Use to check blood sugar 3 times daily   hydrOXYzine (ATARAX) 25 MG tablet TAKE 1 TABLET (25 MG TOTAL) BY MOUTH 3 (THREE) TIMES DAILY AS NEEDED.   insulin aspart (NOVOLOG  FLEXPEN) 100 UNIT/ML FlexPen Inject 26 Units into the skin 3 (three) times daily with meals.   Insulin Glargine (BASAGLAR KWIKPEN) 100 UNIT/ML Inject 40 Units into the skin 2 (two) times daily.   Insulin Pen Needle (PEN NEEDLES) 31G X 6 MM MISC Use as directed   lidocaine (LIDODERM) 5 % Place 1 patch onto the skin daily. Remove & Discard patch within 12 hours or as directed by MD   metFORMIN (GLUCOPHAGE-XR) 500 MG 24 hr tablet Take 1 tablet by mouth twice daily   methocarbamol (ROBAXIN) 500 MG tablet Take 1 tablet (500 mg total) by mouth 3 (three) times daily as needed for muscle spasms.   metoprolol tartrate (LOPRESSOR) 25 MG tablet Take 0.5 tablets (12.5 mg total) by mouth 2 (two) times daily.   Multiple Vitamins-Minerals (EYE VITAMINS PO) Take 1 tablet by mouth daily.   OLANZapine (ZYPREXA) 15 MG tablet Take 1 tablet (15 mg total) by mouth at bedtime.   omeprazole (PRILOSEC) 40 MG capsule Take 1 capsule (40 mg total) by mouth daily. Take 30 minutes prior to breakfast   ondansetron (ZOFRAN-ODT) 4 MG disintegrating tablet Take 1 tablet (4 mg total) by mouth every 8 (eight) hours as needed for nausea or vomiting.   SUMAtriptan (IMITREX) 100 MG tablet TAKE 1 TABLET EARLIEST ONSET OF MIGRAINE. MAY REPEAT IN 2 HOURS IF HEADACHE PERSISTS OR RECURS. MAXIMUM 2 TABLETS IN 24 HOURS.   triamcinolone cream (KENALOG) 0.1 % Apply 1 Application topically 2 (two) times daily.   TRUEPLUS LANCETS 28G MISC Check blood sugars three time a day   zolpidem (AMBIEN) 5 MG tablet Take 1 tablet (5 mg total) by mouth at bedtime as needed for sleep.     Allergies:   Aspirin and Oxycodone   Social History   Socioeconomic History   Marital status: Significant Other    Spouse name: Not on file   Number of children: 3   Years of education: 14   Highest education level: Not on file  Occupational History   Not on file  Tobacco Use   Smoking status: Former    Packs/day: 0.25    Years: 8.00    Total pack years: 2.00     Types: Cigarettes    Quit date: 2021    Years since quitting: 3.1   Smokeless tobacco: Never  Vaping Use   Vaping Use: Never used  Substance and Sexual Activity   Alcohol use: Not Currently    Comment: occasional   Drug use: No   Sexual activity: Yes    Birth control/protection: Surgical  Other Topics Concern   Not on file  Social History Narrative   Patient lives at home with mother and father , one story    Patient has 3 children    Patient is single  Patient has 14 years of education    Patient is right handed    Caffeine none   Social Determinants of Health   Financial Resource Strain: Not on file  Food Insecurity: Food Insecurity Present (05/24/2022)   Hunger Vital Sign    Worried About Running Out of Food in the Last Year: Sometimes true    Ran Out of Food in the Last Year: Sometimes true  Transportation Needs: No Transportation Needs (01/30/2021)   PRAPARE - Hydrologist (Medical): No    Lack of Transportation (Non-Medical): No  Physical Activity: Not on file  Stress: Not on file  Social Connections: Not on file     Family History: The patient's family history includes Colon cancer in her paternal grandmother; Colon cancer (age of onset: 52) in her father; Depression in her mother; Diabetes in her father and mother; Hypertension in her father and mother; Mental illness in her mother. There is no history of Esophageal cancer, Stomach cancer, or Rectal cancer.  ROS:   Please see the history of present illness.    Review of Systems  Constitutional:  Positive for malaise/fatigue. Negative for chills and fever.  HENT:  Negative for sore throat.   Eyes:  Negative for blurred vision.  Respiratory:  Positive for shortness of breath.   Cardiovascular:  Positive for chest pain and palpitations. Negative for orthopnea, claudication, leg swelling and PND.  Gastrointestinal:  Negative for blood in stool.  Genitourinary:  Negative for hematuria.   Musculoskeletal:  Negative for falls.  Neurological:  Negative for dizziness and loss of consciousness.  Psychiatric/Behavioral:  Positive for depression. Negative for substance abuse.      EKGs/Labs/Other Studies Reviewed:    The following studies were reviewed today: Cardiac Monitor 11/2022:   Patch wear time was 11 days and 14 days   There was 1 run of NSVT lasting 4 beats   There was 1 run of SVT lasting 4 beats   Rare SVE, rare PVCs (<1%)   No sustained arrhythmias or significant pauses   Patient triggered events correlate with NSR/sinus tachycardia     Patch Wear Time:  11 days and 14 hours (2023-11-06T17:22:33-0500 to 2023-11-18T07:48:35-0500)   Monitor 1 Patient had a min HR of 86 bpm, max HR of 153 bpm, and avg HR of 112 bpm. Predominant underlying rhythm was Sinus Rhythm. No Isolated SVEs, SVE Couplets, or SVE Triplets were present. Isolated VEs were rare (<1.0%), VE Couplets were rare (<1.0%), and no  VE Triplets were present.    Monitor 2 Patient had a min HR of 73 bpm, max HR of 167 bpm, and avg HR of 103 bpm. Predominant underlying rhythm was Sinus Rhythm. 1 run of Ventricular Tachycardia occurred lasting 4 beats with a max rate of 167 bpm (avg 145 bpm). 1 run of Supraventricular  Tachycardia occurred lasting 4 beats with a max rate of 141 bpm (avg 126 bpm). Isolated SVEs were rare (<1.0%), and no SVE Couplets or SVE Triplets were present. Isolated VEs were rare (<1.0%), VE Couplets were rare (<1.0%), and no VE Triplets were  present.    Gwyndolyn Kaufman, MD Coronary CT 01/2022 FINDINGS: Scan was triggered in the descending thoracic aorta. Axial non-contrast 3 mm slices were carried out through the heart. The data set was analyzed on a dedicated work station and scored using the [place]. Gantry rotation speed was 250 msecs and collimation was .6 mm. 0.8 mg of sl NTG was given. The 3D  data set was reconstructed in 5% intervals of the 67-82 % of the R-R  cycle. Diastolic phases were analyzed on a dedicated work station using MPR, MIP and VRT modes. The patient received 95 cc of contrast.   Aorta:  Normal size.  No calcifications.  No dissection.   Main Pulmonary Artery: Dilation of the main pulmonary artery, mild, at 31 mm.   Aortic Valve:  Tri-leaflet.  No calcifications.   Coronary Arteries:  Normal coronary origin.  Right dominance.   Coronary Calcium Score:   Left main: 0   Left anterior descending artery: 1   Left circumflex artery: 0   Right coronary artery: 0   Total: 1   Percentile: 83rd for age, sex, and race matched control.   RCA is a large dominant artery that gives rise to PDA and PLA. There is no significant plaque.   Left main is a large artery that gives rise to LAD and LCX arteries. There is no significant plaque.   LAD is a large vessel that gives rise to multiple diagonals. Minimal calcified plaque in the mid LAD.   LCX is a non-dominant artery that gives rise multiple OM branches. Mild soft plaque in the mid LCX.   Other findings:   Normal pulmonary vein drainage into the left atrium.   Normal left atrial appendage without a thrombus.   Extra-cardiac findings: See attached radiology report for non-cardiac structures.   IMPRESSION: 1. Coronary calcium score of 1. This was 83rd percentile for age, sex, and race matched control.   2. Normal coronary origin with right dominance.   3. Dilation of the main pulmonary artery, mild, at 31 mm. This can be associated with the presence of pulmonary hypertension; clinical correlation advised.   4. CAD-RADS 2. Mild non-obstructive CAD (25-49%). Consider non-atherosclerotic causes of chest pain. Consider preventive therapy and risk factor modification.  EKG:  ECG personally reviewed. NSR with HR 74  Recent Labs: 01/17/2023: ALT 68; BUN 11; Creatinine, Ser 0.91; Hemoglobin 13.5; Platelets 401; Potassium 4.2; Sodium 139  Recent Lipid Panel     Component Value Date/Time   CHOL 177 11/30/2021 1039   TRIG 181 (H) 11/30/2021 1039   HDL 40 11/30/2021 1039   CHOLHDL 4.4 11/30/2021 1039   CHOLHDL 3.2 11/14/2014 0707   VLDL 19 11/14/2014 0707   LDLCALC 105 (H) 11/30/2021 1039         Physical Exam:    VS:  BP 124/88   Pulse 74   Ht 5' 3"$  (1.6 m)   Wt 235 lb 12.8 oz (107 kg)   PF 95 L/min   BMI 41.77 kg/m     Wt Readings from Last 3 Encounters:  02/13/23 235 lb 12.8 oz (107 kg)  01/17/23 225 lb 1.4 oz (102.1 kg)  12/18/22 225 lb (102.1 kg)     GEN:  Comfortable, NAD HEENT: Normal NECK: No JVD; No carotid bruits CARDIAC: RRR, no murmurs RESPIRATORY:  Clear bilaterally ABDOMEN: Obese, soft, NTTP MUSCULOSKELETAL:  No edema; No deformity  SKIN: Warm and dry NEUROLOGIC:  Alert and oriented x 3 PSYCHIATRIC:  Normal affect   ASSESSMENT:    1. Precordial pain   2. Mixed hyperlipidemia   3. Type 2 diabetes mellitus with hyperglycemia, with long-term current use of insulin (HCC)   4. Palpitations      PLAN:    In order of problems listed above:  #Chest Pain: #Mild CAD: Reassuring ischemic work-up with coronary CTA with mild disease. Ca score 1 (  83%). Unclear etiology of her chest pain and possibly GI in nature. Will start amlodipine in case element of vasospasm to see if helps symptoms. Will also check TTE to ensure no structural abnormalities. Discussed importance of DM control as well. -Check TTE -Start amlodipine 2.87m daily in case some element of vasospasm -Continue metop 12.59mBID -Continue lipitor 4021maily -Discussed importance of DM control  -She is undergoing GI work-up as well  #Palpitations: Cardiac monitor 09/2022 reassuring with no significant ectopy or arrhythmias.  -Continue metop 12.5mg58mD   #HLD: -Continue lipitor 40mg63mly -Plans to check labs with PCP  #Poorly controlled autoimmune DMI: Managed by Endocrinology. A1C 10.8 -Continue insulin; plans to start pump     Medication  Adjustments/Labs and Tests Ordered: Current medicines are reviewed at length with the patient today.  Concerns regarding medicines are outlined above.  Orders Placed This Encounter  Procedures   EKG 12-Lead   ECHOCARDIOGRAM COMPLETE    Meds ordered this encounter  Medications   amLODipine (NORVASC) 2.5 MG tablet    Sig: Take 1 tablet (2.5 mg total) by mouth daily.    Dispense:  90 tablet    Refill:  2    Patient Instructions  Medication Instructions:   START TAKING AMLODIPINE 2.5 MG BY MOUTH DAILY  *If you need a refill on your cardiac medications before your next appointment, please call your pharmacy*   Testing/Procedures:  Your physician has requested that you have an echocardiogram. Echocardiography is a painless test that uses sound waves to create images of your heart. It provides your doctor with information about the size and shape of your heart and how well your heart's chambers and valves are working. This procedure takes approximately one hour. There are no restrictions for this procedure. Please do NOT wear cologne, perfume, aftershave, or lotions (deodorant is allowed). Please arrive 15 minutes prior to your appointment time.    Follow-Up: At [facility] and your health needs are our priority.  As part of our continuing mission to provide you with exceptional heart care, we have created designated Provider Care Teams.  These Care Teams include your primary Cardiologist (physician) and Advanced Practice Providers (APPs -  Physician Assistants and Nurse Practitioners) who all work together to provide you with the care you need, when you need it.  We recommend signing up for the patient portal called "MyChart".  Sign up information is provided on this After Visit Summary.  MyChart is used to connect with patients for Virtual Visits (Telemedicine).  Patients are able to view lab/test results, encounter notes, upcoming appointments, etc.  Non-urgent messages  can be sent to your provider as well.   To learn more about what you can do with MyChart, go to [email or web address] next appointment:   6 month(s)  Provider:   HeathFreada Bergeron        Signed, HeathFreada Bergeron 02/13/2023 8:58 AM    [facility]

## 2023-02-12 ENCOUNTER — Other Ambulatory Visit (HOSPITAL_COMMUNITY): Payer: Self-pay

## 2023-02-12 ENCOUNTER — Other Ambulatory Visit: Payer: Self-pay

## 2023-02-13 ENCOUNTER — Ambulatory Visit: Payer: Medicaid Other | Attending: Family Medicine | Admitting: Pharmacist

## 2023-02-13 ENCOUNTER — Encounter: Payer: Self-pay | Admitting: Cardiology

## 2023-02-13 ENCOUNTER — Other Ambulatory Visit: Payer: Self-pay

## 2023-02-13 ENCOUNTER — Ambulatory Visit: Payer: Medicaid Other | Attending: Cardiology | Admitting: Cardiology

## 2023-02-13 VITALS — BP 124/88 | HR 74 | Ht 63.0 in | Wt 235.8 lb

## 2023-02-13 DIAGNOSIS — E782 Mixed hyperlipidemia: Secondary | ICD-10-CM

## 2023-02-13 DIAGNOSIS — Z794 Long term (current) use of insulin: Secondary | ICD-10-CM

## 2023-02-13 DIAGNOSIS — E1165 Type 2 diabetes mellitus with hyperglycemia: Secondary | ICD-10-CM

## 2023-02-13 DIAGNOSIS — R002 Palpitations: Secondary | ICD-10-CM | POA: Diagnosis not present

## 2023-02-13 DIAGNOSIS — R072 Precordial pain: Secondary | ICD-10-CM

## 2023-02-13 MED ORDER — LANTUS SOLOSTAR 100 UNIT/ML ~~LOC~~ SOPN
50.0000 [IU] | PEN_INJECTOR | Freq: Two times a day (BID) | SUBCUTANEOUS | 1 refills | Status: DC
  Filled 2023-02-13: qty 90, 90d supply, fill #0

## 2023-02-13 MED ORDER — NOVOLOG FLEXPEN 100 UNIT/ML ~~LOC~~ SOPN
30.0000 [IU] | PEN_INJECTOR | Freq: Three times a day (TID) | SUBCUTANEOUS | 3 refills | Status: DC
  Filled 2023-02-13: qty 15, 17d supply, fill #0
  Filled 2023-03-03: qty 45, 50d supply, fill #1

## 2023-02-13 MED ORDER — BASAGLAR KWIKPEN 100 UNIT/ML ~~LOC~~ SOPN
50.0000 [IU] | PEN_INJECTOR | Freq: Two times a day (BID) | SUBCUTANEOUS | 1 refills | Status: DC
  Filled 2023-02-13: qty 90, 90d supply, fill #0

## 2023-02-13 MED ORDER — FREESTYLE LIBRE 3 SENSOR MISC
1.0000 | 11 refills | Status: DC
  Filled 2023-02-13: qty 2, 28d supply, fill #0
  Filled 2023-03-10: qty 2, 28d supply, fill #1

## 2023-02-13 MED ORDER — AMLODIPINE BESYLATE 2.5 MG PO TABS: 2.5000 mg | ORAL_TABLET | Freq: Every day | ORAL | 2 refills | Status: DC

## 2023-02-13 NOTE — Progress Notes (Signed)
S:     No chief complaint on file.  52 y.o. female who presents for diabetes evaluation, education, and management.  PMH is significant for T2DM, AKI, MDD.  Patient was referred and last seen by Primary Care Provider, Dr. Wynetta Emery, on 12/12/22.   Patient last saw endocrinology on 02/10/23. Discussed getting an insulin pump in the future. Increased Basaglar to 40 units QAM and 46 units QPM. Increased Novolog to 26 units TID (30 units if sugars are >300). Continued metformin 500 mg XR BID. A1c 10.8 on 12/21.  Today, patient arrives in good spirits and presents without any assistance.   Patient reports Diabetes was diagnosed in 2020.   Family History: T2DM, depression, HTN Social History: Former smoker. Quit in 2021.  Current diabetes medications include: Basaglar 40 units in the morning and 46 units at night, Novolog 26-30 units TID, metformin 500 mg XR BID  Patient reports adherence to taking all medications as prescribed.   Do you feel that your medications are working for you? yes Have you been experiencing any side effects to the medications prescribed? no Do you have any problems obtaining medications due to transportation or finances? no Insurance coverage: [place] Medicaid  Patient reports hypoglycemic events. CGM showed 1 hypoglycemic event. Patient states she was very busy that day and used the appropriate hypoglycemic management plan to treat.  Patient reports nocturia (nighttime urination).  Patient reports neuropathy (nerve pain). Patient denies visual changes. Patient reports self foot exams.   Patient reported dietary habits: Endorses restricting added sugars, is trying to eat more vegetables.  Patient-reported exercise habits: Denies exercise outside of work.  O:  ROS Physical Exam  Recently started using the Freestyle Libre 3 and has only been wearing it for 3 days. Average Glucose: 305 mg/dL Time in Goal:  - Time in range 70-180: 16% - Time above range:  83% - Time below range: 1% Observed patterns: Blood sugars are typically highest from 6 pm-6 am with a range of 305-362. Afternoon sugars are in the low 200s.   Lab Results  Component Value Date   HGBA1C 10.8 (A) 12/12/2022   There were no vitals filed for this visit.  Lipid Panel     Component Value Date/Time   CHOL 177 11/30/2021 1039   TRIG 181 (H) 11/30/2021 1039   HDL 40 11/30/2021 1039   CHOLHDL 4.4 11/30/2021 1039   CHOLHDL 3.2 11/14/2014 0707   VLDL 19 11/14/2014 0707   LDLCALC 105 (H) 11/30/2021 1039    Clinical Atherosclerotic Cardiovascular Disease (ASCVD): No  The 10-year ASCVD risk score (Arnett DK, et al., 2019) is: 6.6%   Values used to calculate the score:     Age: 72 years     Sex: Female     Is Non-Hispanic African American: Yes     Diabetic: Yes     Tobacco smoker: No     Systolic Blood Pressure: [id number] mmHg     Is BP treated: No     HDL Cholesterol: 40 mg/dL     Total Cholesterol: 177 mg/dL   Patient is participating in a Managed Medicaid Plan: Yes   A/P: Diabetes longstanding currently uncontrolled. Patient is able to verbalize appropriate hypoglycemia management plan. Medication adherence appears appropriate. -Switched to Lantus due to insurance preference. Increased dose of basal insulin Lantus (insulin glargine)  from 40 units QAM and 46 units QPM to 50 units BID.  -Increased dose of rapid insulin Novolog (insulin aspart) from 26 to 30 units  TID.  -Continued metformin 500 mg XR BID.  -Extensively discussed pathophysiology of diabetes, recommended lifestyle interventions, dietary effects on blood sugar control.  -Counseled on s/sx of and management of hypoglycemia.  -Next A1c anticipated 02/2023.   Written patient instructions provided. Patient verbalized understanding of treatment plan.  Total time in face to face counseling 30 minutes.    Follow-up:  With endocrinology in 4-6 weeks. PCP clinic visit in 02/2023.   Patient seen with Lillard Anes PharmD candidate co [id number] [facility]  Benard Halsted, PharmD, [place], [place] [phone]

## 2023-02-13 NOTE — Patient Instructions (Signed)
Medication Instructions:   START TAKING AMLODIPINE 2.5 MG BY MOUTH DAILY  *If you need a refill on your cardiac medications before your next appointment, please call your pharmacy*   Testing/Procedures:  Your physician has requested that you have an echocardiogram. Echocardiography is a painless test that uses sound waves to create images of your heart. It provides your doctor with information about the size and shape of your heart and how well your heart's chambers and valves are working. This procedure takes approximately one hour. There are no restrictions for this procedure. Please do NOT wear cologne, perfume, aftershave, or lotions (deodorant is allowed). Please arrive 15 minutes prior to your appointment time.    Follow-Up: At [facility], you and your health needs are our priority.  As part of our continuing mission to provide you with exceptional heart care, we have created designated Provider Care Teams.  These Care Teams include your primary Cardiologist (physician) and Advanced Practice Providers (APPs -  Physician Assistants and Nurse Practitioners) who all work together to provide you with the care you need, when you need it.  We recommend signing up for the patient portal called "MyChart".  Sign up information is provided on this After Visit Summary.  MyChart is used to connect with patients for Virtual Visits (Telemedicine).  Patients are able to view lab/test results, encounter notes, upcoming appointments, etc.  Non-urgent messages can be sent to your provider as well.   To learn more about what you can do with MyChart, go to [email or web address].    Your next appointment:   6 month(s)  Provider:   Freada Bergeron, MD

## 2023-02-14 ENCOUNTER — Other Ambulatory Visit: Payer: Self-pay

## 2023-02-17 ENCOUNTER — Telehealth: Payer: Self-pay | Admitting: Internal Medicine

## 2023-02-17 NOTE — Telephone Encounter (Signed)
Inbound call from pt, she want to speak with a nurse she is having a abdominal pain and she is extremely bloated and don't know what to do.Please advise

## 2023-02-17 NOTE — Telephone Encounter (Signed)
Pt states she is extremely bloated and the abdominal pain is worse. She saw [facility] but could not afford the down payment for the surgery. Pt states they told her if it got worse she should go to the ER. Discussed with pt that if the pain is worse she should go to the ER as the gallbladder may need to come out. Pt verbalized understanding.

## 2023-02-18 ENCOUNTER — Ambulatory Visit: Payer: Medicaid Other

## 2023-02-18 ENCOUNTER — Ambulatory Visit (INDEPENDENT_AMBULATORY_CARE_PROVIDER_SITE_OTHER): Payer: Medicaid Other | Admitting: Clinical

## 2023-02-18 ENCOUNTER — Other Ambulatory Visit: Payer: Self-pay

## 2023-02-18 DIAGNOSIS — R2689 Other abnormalities of gait and mobility: Secondary | ICD-10-CM

## 2023-02-18 DIAGNOSIS — M79672 Pain in left foot: Secondary | ICD-10-CM

## 2023-02-18 DIAGNOSIS — M79671 Pain in right foot: Secondary | ICD-10-CM | POA: Diagnosis not present

## 2023-02-18 DIAGNOSIS — F331 Major depressive disorder, recurrent, moderate: Secondary | ICD-10-CM | POA: Diagnosis not present

## 2023-02-18 DIAGNOSIS — M6281 Muscle weakness (generalized): Secondary | ICD-10-CM

## 2023-02-18 NOTE — Therapy (Signed)
OUTPATIENT PHYSICAL THERAPY TREATMENT NOTE/Re-Cert   Patient Name: Whitney Benton MRN: [id number] DOB:20-Sep-1971, 52 y.o., female Today's Date: 02/18/2023  PCP: Ladell Pier, MD  REFERRING PROVIDER: Yevonne Pax, DPM   END OF SESSION:   PT End of Session - 02/18/23 1646     Visit Number 12    Number of Visits 17    Date for PT Re-Evaluation 03/06/23    Authorization Type [employer]    PT Start Time 1700    PT Stop Time 1745    PT Time Calculation (min) 45 min    Activity Tolerance Patient tolerated treatment well    Behavior During Therapy WFL for tasks assessed/performed              Past Medical History:  Diagnosis Date   Allergy    Anxiety    Arthritis    Asthma    Depression    Diabetes mellitus without complication ([place])    GERD (gastroesophageal reflux disease)    Glaucoma suspect    Hyperlipidemia    Trichomonas infection    Past Surgical History:  Procedure Laterality Date   CESAREAN SECTION     UPPER GASTROINTESTINAL ENDOSCOPY     VAGINAL HYSTERECTOMY  12/23/2004   Fibroids, menorrhagia, benign pathology   Patient Active Problem List   Diagnosis Date Noted   Severe recurrent major depressive disorder with psychotic features ([place]) 12/27/2022   Primary insomnia 12/27/2022   Morbid obesity ([place]) 04/04/2022   Former smoker 04/30/2021   Major depressive disorder, recurrent episode, moderate ([place]) 04/30/2021   Substance induced mood disorder ([place]) 03/22/2021   Generalized anxiety disorder 03/22/2021   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   DKA (diabetic ketoacidosis) ([place]) 01/23/2021   Glaucoma suspect 04/12/2020   DKA, type 2, not at goal [facility]) 10/02/2019   AKI (acute kidney injury) ([place]) 10/02/2019   Hyperkalemia 10/02/2019   Leukocytosis 10/02/2019   Abnormal LFTs 10/02/2019   Stressful life events affecting family and household 08/06/2019   New onset type 2 diabetes mellitus ([place]) 02/04/2019   Obesity (BMI  35.0-39.9 without comorbidity) 02/04/2019   Tobacco dependence 02/04/2019   Left arm weakness 12/06/2014   Paresthesias/numbness 12/06/2014   Tobacco abuse 11/14/2014   Depression    Mixed incontinence 06/27/2014   History of TVH in 2006 for fibroids and menorrhagia; benign pathology 09/08/2011    REFERRING DIAG: M72.2 (ICD-10-CM) - Plantar fasciitis, bilateral    THERAPY DIAG:  Pain in right foot  Pain in left foot  Other abnormalities of gait and mobility  Muscle weakness (generalized)  Rationale for Evaluation and Treatment Rehabilitation  PERTINENT HISTORY: Anxiety, Depression, DMII   PRECAUTIONS: None  SUBJECTIVE:  SUBJECTIVE STATEMENT:  Patient reports that her pain continues and that the steroid injections have not been helping much.   PAIN:  Are you having pain?  Yes: NPRS scale: 7-8/10 Worst: 10/10 Pain location: bilateral heels Pain description: sharp Aggravating factors: stairs, first step out of bed Relieving factors: none   OBJECTIVE: (objective measures completed at initial evaluation unless otherwise dated)   DIAGNOSTIC FINDINGS:             See imaging    PATIENT SURVEYS:  FOTO: 38% function; 52% predicted - 02/06/23   COGNITION: Overall cognitive status: Within functional limits for tasks assessed                         SENSATION: Not tested   POSTURE: rounded shoulders, forward head, and larger body habitus   PALPATION: TTP to bilateral heels, bilateral gastroc   LOWER EXTREMITY ROM:   Active ROM Right eval Left eval Right 01/16/23 Left 01/16/23 Right 02/06/23 Left 02/06/23  Hip flexion          Hip extension          Hip abduction          Hip adduction          Hip internal rotation          Hip external rotation          Knee flexion          Knee  extension          Ankle dorsiflexion -5 -10 -1 -8 0 -8  Ankle plantarflexion          Ankle inversion          Ankle eversion           (Blank rows = not tested)   LOWER EXTREMITY MMT:   MMT Right eval Left eval  Hip flexion      Hip extension      Hip abduction      Hip adduction      Hip internal rotation      Hip external rotation      Knee flexion      Knee extension      Ankle dorsiflexion      Ankle plantarflexion 4/5 3+/5 p!  Ankle inversion      Ankle eversion       (Blank rows = not tested)   LOWER EXTREMITY SPECIAL TESTS:  DNT   FUNCTIONAL TESTS:  Five Time Sit to Stand: 16 seconds - 02/06/23 SLS: 5" R  5" L   GAIT: Distance walked: 11f Assistive device utilized: None Level of assistance: Complete Independence Comments: antalgica gait L, decreased heel strike   TREATMENT: [facility] Adult PT Treatment:                                                DATE: 02/18/23 Therapeutic Exercise: Nustep level 6 x 6 mins Slant board gastroc stretch x 60" Slant board soleus stretch x 60" Heel raises on wall 2x10 Toe raises on wall 2x10 Tandem on foam 2x30" each Mini squat on foam 2x10 Tandem walking on Airex with single UE support x3 laps BAPS L3 x 10 cw/ccw each Manual Therapy: STM/MTPR BIL gastroc  [facility] Adult PT Treatment:  DATE: 02/06/2023 Therapeutic Activity: Assessment of tests/measures, goals, and outcomes for recert Manual Therapy: STM/MTPR BIL gastroc  [facility] Adult PT Treatment:                                                DATE: 01/30/2023 Therapeutic Exercise: Nustep level 6 x 5 mins Slant board gastroc stretch x 60" Slant board soleus stretch x 60" Heel toe raises x 20 Tandem on foam 2x30" each Mini squat on foam 2x10 Tandem walking on Airex with single UE support x3 laps BAPS L3 x 10 cw/ccw each STS 2x10  Manual Therapy: STM/MTPR BIL gastroc    PATIENT EDUCATION:  Education details: eval  findings, FOTO, HEP, POC Person educated: Patient Education method: Explanation, Demonstration, and Handouts Education comprehension: verbalized understanding and returned demonstration   HOME EXERCISE PROGRAM: Access Code: [facility] URL: [email or web address] Date: 12/11/2022 Prepared by: Octavio Manns   Exercises - Long Sitting Calf Stretch with Strap  - 2 x daily - 7 x weekly - 2-3 reps - 30 sec hold - Towel Scrunches  - 2 x daily - 7 x weekly - 2 reps - 60 sec hold - Seated Plantar Fascia Mobilization with Small Ball  - 2 x daily - 7 x weekly - 3 sets - 10 reps - Ankle and Toe Plantarflexion with Resistance  - 2 x daily - 7 x weekly - 3 sets - 10 reps - blue theraband hold   ASSESSMENT:   CLINICAL IMPRESSION: Patient presents to PT with continued reports of pain in BIL feet and states that the steroid injections haven't been helping her much. Session today continued to focus on distal LE strengthening, balance tasks, and manual techniques to decrease tension. Patient was able to tolerate all prescribed exercises with no adverse effects. Patient continues to benefit from skilled PT services and should be progressed as able to improve functional independence.    OBJECTIVE IMPAIRMENTS: Abnormal gait, decreased activity tolerance, decreased mobility, difficulty walking, decreased ROM, decreased strength, and pain.    ACTIVITY LIMITATIONS: standing, squatting, stairs, transfers, and locomotion level   PARTICIPATION LIMITATIONS: meal prep, cleaning, laundry, driving, community activity, and yard work   PERSONAL FACTORS: Fitness and 3+ comorbidities: Anxiety, Depression, DMII  are also affecting patient's functional outcome.      GOALS: Goals reviewed with patient? No   SHORT TERM GOALS: Target date: 01/01/2023   Pt will be compliant and knowledgeable with initial HEP for improved comfort and carryover Baseline: initial HEP given  Goal status: MET   2.  Pt will self  report bilateral heel pain no greater than 6/10 for improved comfort and functional ability Baseline: 10/10 at worst Goal status: Ongoing Pt reports 10/10 at worst 01/07/23   LONG TERM GOALS: Target date: 03/06/2023   Pt will self report bilateral heel pain no greater than 3/10 for improved comfort and functional ability Baseline: 10/10 at worst Goal status: ONGOING   2.  Pt will improve FOTO function score to no less than 52% as proxy for functional improvement Baseline: 28% function 02/06/2023: 38% function Goal status: ONGOING   3.  Pt will improve Five Time Sit to Stand to no greater than 15 seconds for improved balance and functional mobility Baseline: 22 seconds with pain 02/06/2023: 16 seconds Goal status: PROGRESSING   4.  Pt will improve bilateral ankle DF to no less  than 5 for improved gait and decreased pain  Baseline: see chart Goal status: IONGOING   5.  Pt will improve bilateral SLS time to no less than 15 seconds for improved balance and ankle stability Baseline: 4" R  2" L - (L painful) Goal status: ONGOING     PLAN:   PT FREQUENCY: 2x/week   PT DURATION: 8 weeks   PLANNED INTERVENTIONS: Therapeutic exercises, Therapeutic activity, Neuromuscular re-education, Balance training, Gait training, Patient/Family education, Self Care, Joint mobilization, Aquatic Therapy, Dry Needling, Electrical stimulation, Cryotherapy, Moist heat, Manual therapy, and Re-evaluation   PLAN FOR NEXT SESSION: assess HEP response, ankle strengthening, calf stretching, TPDN?   Margarette Canada, PTA 02/18/2023, 5:46 PM

## 2023-02-18 NOTE — Progress Notes (Signed)
   THERAPIST PROGRESS NOTE  Session Time: 30 minutes  Participation Level: Active  Behavioral Response: CasualAlertAnxious  Type of Therapy: Individual Therapy  Treatment Goals addressed: client will complete 80% of assigned homework  ProgressTowards Goals: Progressing  Interventions: CBT and Supportive  Summary:  Whitney Benton is a 52 y.o. female who presents for the scheduled appointment oriented x 5, appropriately dressed, and friendly.  Client denied hallucinations and delusions. Client reported on today she is feeling a mix of emotions.  Client reported since she was last seen she received a decision for her disability and was denied.  Client reported she did not check in with her family.  Client reported she contacted her lawyer and asked if she would be able to appeal with them. Client reported they are going to get back to her about whether they can or not.  Client reported she is going to seek some medical attention because when she eats her stomach hurts.  Client reported she believes it is coming from her gallbladder and she would have to go to the emergency room to see about it.  Client reported her current doctor would cost her too much money because he does not accept Medicaid regarding that specialty. Client reported she wants to get back to spending her energy to do things for her. Client reported she wants to get new dentures as well. Client reported over the years with the stress of everything she has had to deal with she has not cared for herself as she would like. Evidence of progress towards goal:  client reported she is doing 1 skill of problem solving legal stress and health needs.   Suicidal/Homicidal: Nowithout intent/plan  Therapist Response:  Therapist began the appointment asking the client how she hs been doing since last seen. Therapist used CBT to engage using active listening and positive emotional support. Therapist used CBT to engage and listen to the  client explain her stress with legal, finances, family and health. Therapist used CBT to normalize the clients emotions within reason. Therapist used CBT ask the client to identify her progress with frequency of use with coping skills with continued practice in her daily activity.    Therapist assigned the client homework to follow up with her lawyers and practice self care. Client was scheduled for next appointment.    Plan: Return again in 3 weeks.  Diagnosis: major depressive disorder, recurrent episode, moderate  Collaboration of Care: Patient refused AEB none requested by the client.  Patient/Guardian was advised Release of Information must be obtained prior to any record release in order to collaborate their care with an outside provider. Patient/Guardian was advised if they have not already done so to contact the registration department to sign all necessary forms in order for [place] to release information regarding their care.   Consent: Patient/Guardian gives verbal consent for treatment and assignment of benefits for services provided during this visit. Patient/Guardian expressed understanding and agreed to proceed.   [place], LCSW 02/18/2023

## 2023-02-20 ENCOUNTER — Other Ambulatory Visit: Payer: Self-pay

## 2023-02-21 ENCOUNTER — Ambulatory Visit
Admission: RE | Admit: 2023-02-21 | Discharge: 2023-02-21 | Disposition: A | Discharge status: Home / Self Care | Payer: Medicaid Other | Source: Ambulatory Visit | Attending: Internal Medicine | Admitting: Internal Medicine

## 2023-02-21 DIAGNOSIS — R110 Nausea: Secondary | ICD-10-CM | POA: Diagnosis not present

## 2023-02-21 DIAGNOSIS — N200 Calculus of kidney: Secondary | ICD-10-CM | POA: Diagnosis not present

## 2023-02-21 DIAGNOSIS — R1010 Upper abdominal pain, unspecified: Secondary | ICD-10-CM

## 2023-02-21 MED ORDER — IOPAMIDOL (ISOVUE-300) INJECTION 61%
100.0000 mL | Freq: Once | INTRAVENOUS | Status: AC | PRN
  Administered 2023-02-21: 100 mL via INTRAVENOUS

## 2023-02-25 ENCOUNTER — Other Ambulatory Visit: Payer: Self-pay | Admitting: *Deleted

## 2023-02-25 ENCOUNTER — Other Ambulatory Visit: Payer: Self-pay

## 2023-02-25 DIAGNOSIS — E1065 Type 1 diabetes mellitus with hyperglycemia: Secondary | ICD-10-CM | POA: Diagnosis not present

## 2023-02-25 DIAGNOSIS — E139 Other specified diabetes mellitus without complications: Secondary | ICD-10-CM | POA: Diagnosis not present

## 2023-02-25 NOTE — Telephone Encounter (Signed)
Will forward to correct nurse

## 2023-02-25 NOTE — Telephone Encounter (Signed)
Requested medication (s) are due for refill today: yes  Requested medication (s) are on the active medication list: yes    Last refill: 04/29/22 18g  0 refills  Future visit scheduled yes 03/06/23  Notes to clinic:Historical Provider, please review. Thank you.  Requested Prescriptions  Pending Prescriptions Disp Refills   albuterol (VENTOLIN HFA) 108 (90 Base) MCG/ACT inhaler 18 g 0    Sig: Inhale 2 puffs into the lungs every 6 (six) hours as needed for wheezing or shortness of breath (Cough).     Pulmonology:  Beta Agonists 2 Passed - 02/25/2023  8:56 AM      Passed - Last BP in normal range    BP Readings from Last 1 Encounters:  02/13/23 124/88         Passed - Last Heart Rate in normal range    Pulse Readings from Last 1 Encounters:  02/13/23 74         Passed - Valid encounter within last 12 months    Recent Outpatient Visits           1 week ago Type 2 diabetes mellitus with hyperglycemia, with long-term current use of insulin [facility])   Wymore, Salmon L, RPH-CPP   1 month ago Type 2 diabetes mellitus with hyperglycemia, with long-term current use of insulin [facility])   McNairy, [place] L, RPH-CPP   2 months ago Type 2 diabetes mellitus with hyperglycemia, with long-term current use of insulin [facility])   Nixa Karle Plumber B, MD   4 months ago Type 2 diabetes mellitus with hyperglycemia, with long-term current use of insulin [facility])   Royal Kunia, Sullivan L, RPH-CPP   5 months ago Type 2 diabetes mellitus with hyperglycemia, with long-term current use of insulin [facility])   [place], RPH-CPP       Future Appointments             In 1 week Ladell Pier, MD [place]   In 5 months Johney Frame,  Greer Ee, MD [place] at [facility], [place]

## 2023-02-26 ENCOUNTER — Ambulatory Visit: Payer: Medicaid Other

## 2023-02-26 MED ORDER — ALBUTEROL SULFATE HFA 108 (90 BASE) MCG/ACT IN AERS: 2.0000 | INHALATION_SPRAY | Freq: Four times a day (QID) | RESPIRATORY_TRACT | 0 refills | Status: DC | PRN

## 2023-02-27 ENCOUNTER — Ambulatory Visit: Payer: Medicaid Other | Attending: Podiatry

## 2023-02-27 DIAGNOSIS — R2689 Other abnormalities of gait and mobility: Secondary | ICD-10-CM | POA: Diagnosis present

## 2023-02-27 DIAGNOSIS — M6281 Muscle weakness (generalized): Secondary | ICD-10-CM | POA: Insufficient documentation

## 2023-02-27 DIAGNOSIS — M79671 Pain in right foot: Secondary | ICD-10-CM | POA: Insufficient documentation

## 2023-02-27 DIAGNOSIS — M79672 Pain in left foot: Secondary | ICD-10-CM | POA: Insufficient documentation

## 2023-02-27 NOTE — Therapy (Signed)
OUTPATIENT PHYSICAL THERAPY TREATMENT NOTE/Re-Cert   Patient Name: Whitney Benton MRN: [id number] DOB:[birth year], 52 y.o., female Today's Date: 02/27/2023  PCP: Ladell Pier, MD  REFERRING PROVIDER: Yevonne Pax, DPM   END OF SESSION:   PT End of Session - 02/27/23 1741     Visit Number 13    Number of Visits 17    Date for PT Re-Evaluation 03/06/23    Authorization Type [employer]    PT Start Time 1745    PT Stop Time [id number]    PT Time Calculation (min) 45 min    Activity Tolerance Patient tolerated treatment well    Behavior During Therapy WFL for tasks assessed/performed               Past Medical History:  Diagnosis Date   Allergy    Anxiety    Arthritis    Asthma    Depression    Diabetes mellitus without complication ([place])    GERD (gastroesophageal reflux disease)    Glaucoma suspect    Hyperlipidemia    Trichomonas infection    Past Surgical History:  Procedure Laterality Date   CESAREAN SECTION     UPPER GASTROINTESTINAL ENDOSCOPY     VAGINAL HYSTERECTOMY  12/23/2004   Fibroids, menorrhagia, benign pathology   Patient Active Problem List   Diagnosis Date Noted   Severe recurrent major depressive disorder with psychotic features ([place]) 12/27/2022   Primary insomnia 12/27/2022   Morbid obesity ([place]) 04/04/2022   Former smoker 04/30/2021   Major depressive disorder, recurrent episode, moderate ([place]) 04/30/2021   Substance induced mood disorder ([place]) 03/22/2021   Generalized anxiety disorder 03/22/2021   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   DKA (diabetic ketoacidosis) ([place]) 01/23/2021   Glaucoma suspect 04/12/2020   DKA, type 2, not at goal [facility]) 10/02/2019   AKI (acute kidney injury) ([place]) 10/02/2019   Hyperkalemia 10/02/2019   Leukocytosis 10/02/2019   Abnormal LFTs 10/02/2019   Stressful life events affecting family and household 08/06/2019   New onset type 2 diabetes mellitus ([place]) 02/04/2019   Obesity (BMI  35.0-39.9 without comorbidity) 02/04/2019   Tobacco dependence 02/04/2019   Left arm weakness 12/06/2014   Paresthesias/numbness 12/06/2014   Tobacco abuse 11/14/2014   Depression    Mixed incontinence 06/27/2014   History of TVH in 2006 for fibroids and menorrhagia; benign pathology 09/08/2011    REFERRING DIAG: M72.2 (ICD-10-CM) - Plantar fasciitis, bilateral    THERAPY DIAG:  Pain in right foot  Pain in left foot  Other abnormalities of gait and mobility  Rationale for Evaluation and Treatment Rehabilitation  PERTINENT HISTORY: Anxiety, Depression, DMII   PRECAUTIONS: None  SUBJECTIVE:  SUBJECTIVE STATEMENT:  Pt presents to PT with continued bilateral heel apin. Has been compliant with HEP.   PAIN:  Are you having pain?  Yes: NPRS scale: 7-8/10 Worst: 10/10 Pain location: bilateral heels Pain description: sharp Aggravating factors: stairs, first step out of bed Relieving factors: none   OBJECTIVE: (objective measures completed at initial evaluation unless otherwise dated)   DIAGNOSTIC FINDINGS:             See imaging    PATIENT SURVEYS:  FOTO: 38% function; 52% predicted - 02/06/23   COGNITION: Overall cognitive status: Within functional limits for tasks assessed                         SENSATION: Not tested   POSTURE: rounded shoulders, forward head, and larger body habitus   PALPATION: TTP to bilateral heels, bilateral gastroc   LOWER EXTREMITY ROM:   Active ROM Right eval Left eval Right 01/16/23 Left 01/16/23 Right 02/06/23 Left 02/06/23  Hip flexion          Hip extension          Hip abduction          Hip adduction          Hip internal rotation          Hip external rotation          Knee flexion          Knee extension          Ankle dorsiflexion -5 -10  -1 -8 0 -8  Ankle plantarflexion          Ankle inversion          Ankle eversion           (Blank rows = not tested)   LOWER EXTREMITY MMT:   MMT Right eval Left eval  Hip flexion      Hip extension      Hip abduction      Hip adduction      Hip internal rotation      Hip external rotation      Knee flexion      Knee extension      Ankle dorsiflexion      Ankle plantarflexion 4/5 3+/5 p!  Ankle inversion      Ankle eversion       (Blank rows = not tested)   LOWER EXTREMITY SPECIAL TESTS:  DNT   FUNCTIONAL TESTS:  Five Time Sit to Stand: 16 seconds - 02/06/23 SLS: 5" R  5" L   GAIT: Distance walked: 74f Assistive device utilized: None Level of assistance: Complete Independence Comments: antalgica gait L, decreased heel strike   TREATMENT: [facility] Adult PT Treatment:                                                DATE: 02/27/23 Therapeutic Exercise: Nustep level 6 x 5 min  Slant board gastroc stretch x 60" Slant board soleus stretch x 60" Mini squat 2x10  Tandem stnace on foam 2x30" each Toe raises on wall 2x10 Heel raise with ball 2x15 STS 2x10  Ankle DF/nv/ev 2x10 RTB Ankle PF 2x10 BTB Manual Therapy: STM/MTPR BIL gastroc  [facility] Adult PT Treatment:  DATE: 02/18/23 Therapeutic Exercise: Nustep level 6 x 6 mins Slant board gastroc stretch x 60" Slant board soleus stretch x 60" Heel raises on wall 2x10 Toe raises on wall 2x10 Tandem on foam 2x30" each Mini squat on foam 2x10 Tandem walking on Airex with single UE support x3 laps BAPS L3 x 10 cw/ccw each Manual Therapy: STM/MTPR BIL gastroc  [facility] Adult PT Treatment:                                                DATE: 02/06/2023 Therapeutic Activity: Assessment of tests/measures, goals, and outcomes for recert Manual Therapy: STM/MTPR BIL gastroc  [facility] Adult PT Treatment:                                                DATE: 01/30/2023 Therapeutic  Exercise: Nustep level 6 x 5 mins Slant board gastroc stretch x 60" Slant board soleus stretch x 60" Heel toe raises x 20 Tandem on foam 2x30" each Mini squat on foam 2x10 Tandem walking on Airex with single UE support x3 laps BAPS L3 x 10 cw/ccw each STS 2x10  Manual Therapy: STM/MTPR BIL gastroc    PATIENT EDUCATION:  Education details: eval findings, FOTO, HEP, POC Person educated: Patient Education method: Explanation, Demonstration, and Handouts Education comprehension: verbalized understanding and returned demonstration   HOME EXERCISE PROGRAM: Access Code: [facility] URL: [email or web address] Date: 12/11/2022 Prepared by: Octavio Manns   Exercises - Long Sitting Calf Stretch with Strap  - 2 x daily - 7 x weekly - 2-3 reps - 30 sec hold - Towel Scrunches  - 2 x daily - 7 x weekly - 2 reps - 60 sec hold - Seated Plantar Fascia Mobilization with Small Ball  - 2 x daily - 7 x weekly - 3 sets - 10 reps - Ankle and Toe Plantarflexion with Resistance  - 2 x daily - 7 x weekly - 3 sets - 10 reps - blue theraband hold   ASSESSMENT:   CLINICAL IMPRESSION: Pt was able to complete prescribed exercises with no adverse effect or increase in pain. Once again responded well to manual therapy interventions. Pt progressing well, will continue per POC.    OBJECTIVE IMPAIRMENTS: Abnormal gait, decreased activity tolerance, decreased mobility, difficulty walking, decreased ROM, decreased strength, and pain.    ACTIVITY LIMITATIONS: standing, squatting, stairs, transfers, and locomotion level   PARTICIPATION LIMITATIONS: meal prep, cleaning, laundry, driving, community activity, and yard work   PERSONAL FACTORS: Fitness and 3+ comorbidities: Anxiety, Depression, DMII  are also affecting patient's functional outcome.      GOALS: Goals reviewed with patient? No   SHORT TERM GOALS: Target date: 01/01/2023   Pt will be compliant and knowledgeable with initial HEP for  improved comfort and carryover Baseline: initial HEP given  Goal status: MET   2.  Pt will self report bilateral heel pain no greater than 6/10 for improved comfort and functional ability Baseline: 10/10 at worst Goal status: Ongoing Pt reports 10/10 at worst 01/07/23   LONG TERM GOALS: Target date: 03/06/2023   Pt will self report bilateral heel pain no greater than 3/10 for improved comfort and functional ability Baseline: 10/10 at worst Goal status: ONGOING  2.  Pt will improve FOTO function score to no less than 52% as proxy for functional improvement Baseline: 28% function 02/06/2023: 38% function Goal status: ONGOING   3.  Pt will improve Five Time Sit to Stand to no greater than 15 seconds for improved balance and functional mobility Baseline: 22 seconds with pain 02/06/2023: 16 seconds Goal status: PROGRESSING   4.  Pt will improve bilateral ankle DF to no less than 5 for improved gait and decreased pain  Baseline: see chart Goal status: IONGOING   5.  Pt will improve bilateral SLS time to no less than 15 seconds for improved balance and ankle stability Baseline: 4" R  2" L - (L painful) Goal status: ONGOING     PLAN:   PT FREQUENCY: 2x/week   PT DURATION: 8 weeks   PLANNED INTERVENTIONS: Therapeutic exercises, Therapeutic activity, Neuromuscular re-education, Balance training, Gait training, Patient/Family education, Self Care, Joint mobilization, Aquatic Therapy, Dry Needling, Electrical stimulation, Cryotherapy, Moist heat, Manual therapy, and Re-evaluation   PLAN FOR NEXT SESSION: assess HEP response, ankle strengthening, calf stretching, TPDN?   Ward Chatters, PT 02/27/2023, 6:37 PM

## 2023-03-03 ENCOUNTER — Other Ambulatory Visit: Payer: Self-pay

## 2023-03-05 ENCOUNTER — Other Ambulatory Visit: Payer: Self-pay

## 2023-03-05 MED ORDER — INSULIN LISPRO 100 UNIT/ML IJ SOLN
100.0000 [IU] | Freq: Every day | INTRAMUSCULAR | 5 refills | Status: DC
  Filled 2023-03-10: qty 30, 30d supply, fill #0
  Filled 2023-04-07: qty 30, 30d supply, fill #1

## 2023-03-06 ENCOUNTER — Encounter: Payer: Self-pay | Admitting: Internal Medicine

## 2023-03-06 ENCOUNTER — Ambulatory Visit: Payer: Medicaid Other | Attending: Internal Medicine | Admitting: Internal Medicine

## 2023-03-06 DIAGNOSIS — F331 Major depressive disorder, recurrent, moderate: Secondary | ICD-10-CM

## 2023-03-06 DIAGNOSIS — R1010 Upper abdominal pain, unspecified: Secondary | ICD-10-CM

## 2023-03-06 DIAGNOSIS — E785 Hyperlipidemia, unspecified: Secondary | ICD-10-CM | POA: Insufficient documentation

## 2023-03-06 DIAGNOSIS — K829 Disease of gallbladder, unspecified: Secondary | ICD-10-CM

## 2023-03-06 DIAGNOSIS — E1169 Type 2 diabetes mellitus with other specified complication: Secondary | ICD-10-CM | POA: Diagnosis not present

## 2023-03-06 DIAGNOSIS — R030 Elevated blood-pressure reading, without diagnosis of hypertension: Secondary | ICD-10-CM

## 2023-03-06 MED ORDER — LANTUS SOLOSTAR 100 UNIT/ML ~~LOC~~ SOPN: 54.0000 [IU] | PEN_INJECTOR | Freq: Two times a day (BID) | SUBCUTANEOUS | 1 refills | Status: DC

## 2023-03-06 NOTE — Progress Notes (Signed)
Patient ID: Whitney Benton, female    DOB: [birth year]  MRN: [id number]  CC: Diabetes (DM f/u & CT results./Pain when eating, "stiff" upper abdomen/Intermittent swelling on finger of L hand, nail damage /No to flu vax. )   Subjective: Whitney Benton is a 52 y.o. female who presents for chronic ds management. She has toddle grandson with her. Her concerns today include:  Pt with hx of depression, migraines (Dr. Tomi Likens), mixed incontinence, DM, obesity, HL, vit D def, former smoker, on metoprolol for tachycardia    DM: Lab Results  Component Value Date   HGBA1C 10.8 (A) 12/12/2022  On last visit with me, I had her discontinue Trulicity due to chronic gastric and left upper quadrant abdominal pain with nausea.  Still having pain across upper abdomen with nausea.  CT scan of the abdomen did not reveal any acute process to explain her symptoms.  She had seen her gastroenterologist Dr. Henrene Pastor last year and had EGD which revealed some superficial erosions along the body of the stomach consistent with NSAID induced pathology.  GB ultrasound revealed small amount of biliary sludge in the gallbladder with 3 mm gallbladder polyp.  She was referred to [facility] surgery by the gastroenterologist to be evaluated for possible biliary colic.  The surgeon had recommended cholecystectomy but at the time she did not have the co-pay.  She has changed insurance from [place] to [place]. Did not f/u with [facility] because they do not take Medicaid.    Since last visit, she has seen the clinical pharmacist.  Long-acting insulin was changed to Lantus due to insurance preference and dose was increased to 50 mg twice a day.  NovoLog increased to 30 units 3 times a day with meals.  She was continued on metformin 500 mg XR twice a day. -reports compliance -has [place] with her.  Current BS is 218.  Time in Range for past 7 days 10%, >250 76%, b/w 181-250 13%, PAST 2 wks in range 12%, > 250 71% -Saw Endocrinologist with [facility]  since I last saw her.  Reports being told that she is type 1 DM. Prescribed insulin pump; received device in mail already.  Plans to start when she gets done with current insulin pens.  Will see them again April 1st -Feel BS high due to increase stress.   Follow[facility].  Feels meds help sometimes.  Has f/u end of mth.  On Cymbalta 80 mg, Zyprexia  She is on metoprolol by cardiologist for history of palpitations.  She also had coronary CTA that showed mild nonobstructive CAD with calcium score of 1.  Saw Dr. Johney Frame 02/13/2023.  Low-dose Norvasc added because of chest pains.  She was wanting to cover for possible vasospasms.     Patient Active Problem List   Diagnosis Date Noted   Hyperlipidemia associated with type 2 diabetes mellitus ([place]) 03/06/2023   Severe recurrent major depressive disorder with psychotic features ([place]) 12/27/2022   Primary insomnia 12/27/2022   Morbid obesity ([place]) 04/04/2022   Former smoker 04/30/2021   Major depressive disorder, recurrent episode, moderate ([place]) 04/30/2021   Substance induced mood disorder ([place]) 03/22/2021   Generalized anxiety disorder 03/22/2021   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   DKA (diabetic ketoacidosis) ([place]) 01/23/2021   Glaucoma suspect 04/12/2020   DKA, type 2, not at goal [facility]) 10/02/2019   AKI (acute kidney injury) ([place]) 10/02/2019   Hyperkalemia 10/02/2019   Leukocytosis 10/02/2019   Abnormal LFTs 10/02/2019  Stressful life events affecting family and household 08/06/2019   New onset type 2 diabetes mellitus ([place]) 02/04/2019   Obesity (BMI 35.0-39.9 without comorbidity) 02/04/2019   Tobacco dependence 02/04/2019   Left arm weakness 12/06/2014   Paresthesias/numbness 12/06/2014   Tobacco abuse 11/14/2014   Depression    Mixed incontinence 06/27/2014   History of TVH in 2006 for fibroids and menorrhagia; benign pathology 09/08/2011     Current Outpatient Medications on File Prior to Visit  Medication  Sig Dispense Refill   acetaminophen (TYLENOL) 500 MG tablet Take 1 tablet (500 mg total) by mouth every 6 (six) hours as needed. 30 tablet 0   acetaminophen-codeine (TYLENOL #3) 300-30 MG tablet Take 1 tablet by mouth 2 (two) times daily as needed for moderate pain. 30 tablet 0   albuterol (VENTOLIN HFA) 108 (90 Base) MCG/ACT inhaler Inhale 2 puffs into the lungs every 6 (six) hours as needed for wheezing or shortness of breath (Cough). 18 g 0   amLODipine (NORVASC) 2.5 MG tablet Take 1 tablet (2.5 mg total) by mouth daily. 90 tablet 2   atorvastatin (LIPITOR) 40 MG tablet Take 1 tablet (40 mg total) by mouth daily. 90 tablet 2   Blood Glucose Monitoring Suppl (TRUE METRIX METER) w/Device KIT Check blood sugars three times a day 1 kit 0   busPIRone (BUSPAR) 15 MG tablet Take 1 tablet (15 mg total) by mouth 3 (three) times daily. 90 tablet 3   Continuous Blood Gluc Receiver (FREESTYLE LIBRE 2 READER) DEVI Use to check blood sugar three times daily. 1 each 0   Continuous Blood Gluc Sensor (FREESTYLE LIBRE 3 SENSOR) MISC Place 1 sensor every 14 (fourteen) days. 2 each 11   cycloSPORINE (RESTASIS) 0.05 % ophthalmic emulsion Apply 1 drop into both eyes twice a day 180 each 3   doxepin (SINEQUAN) 25 MG capsule Take 1 capsule (25 mg total) by mouth at bedtime. 30 capsule 3   DULoxetine (CYMBALTA) 20 MG capsule Take 1 capsule (20 mg total) by mouth daily. 30 capsule 3   DULoxetine (CYMBALTA) 60 MG capsule Take 1 capsule (60 mg total) by mouth daily. 30 capsule 3   Erenumab-aooe (AIMOVIG) 140 MG/ML SOAJ Inject 140 mg into the skin every 28 (twenty-eight) days. 1.12 mL 5   Estradiol 10 MCG TABS vaginal tablet Insert one tab intravaginally 2 times a week 8 tablet 4   fluticasone (FLONASE) 50 MCG/ACT nasal spray Place 1 spray into both nostrils daily. Begin by using 2 sprays in each nare daily for 3 to 5 days, then decrease to 1 spray in each nare daily. 32 mL 1   gabapentin (NEURONTIN) 300 MG capsule Take 1  capsule (300 mg total) by mouth 3 (three) times daily. 90 capsule 3   glucose blood (TRUE METRIX BLOOD GLUCOSE TEST) test strip Use to check blood sugar 3 times daily 100 each 2   hydrOXYzine (ATARAX) 25 MG tablet TAKE 1 TABLET (25 MG TOTAL) BY MOUTH 3 (THREE) TIMES DAILY AS NEEDED. 90 tablet 3   insulin aspart (NOVOLOG FLEXPEN) 100 UNIT/ML FlexPen Inject 30 Units into the skin 3 (three) times daily with meals. 15 mL 3   insulin glargine (LANTUS SOLOSTAR) 100 UNIT/ML Solostar Pen Inject 50 Units into the skin 2 (two) times daily. 90 mL 1   insulin lispro (HUMALOG) 100 UNIT/ML injection Inject 1 mL (100 Units total) via pump once daily. 30 mL 5   Insulin Pen Needle (PEN NEEDLES) 31G X 6 MM MISC Use  as directed 100 each 0   lidocaine (LIDODERM) 5 % Place 1 patch onto the skin daily. Remove & Discard patch within 12 hours or as directed by MD 30 patch 0   metFORMIN (GLUCOPHAGE-XR) 500 MG 24 hr tablet Take 1 tablet by mouth twice daily 180 tablet 0   methocarbamol (ROBAXIN) 500 MG tablet Take 1 tablet (500 mg total) by mouth 3 (three) times daily as needed for muscle spasms. 20 tablet 2   metoprolol tartrate (LOPRESSOR) 25 MG tablet Take 0.5 tablets (12.5 mg total) by mouth 2 (two) times daily. 90 tablet 2   Multiple Vitamins-Minerals (EYE VITAMINS PO) Take 1 tablet by mouth daily.     OLANZapine (ZYPREXA) 15 MG tablet Take 1 tablet (15 mg total) by mouth at bedtime. 30 tablet 3   omeprazole (PRILOSEC) 40 MG capsule Take 1 capsule (40 mg total) by mouth daily. Take 30 minutes prior to breakfast 30 capsule 3   ondansetron (ZOFRAN-ODT) 4 MG disintegrating tablet Take 1 tablet (4 mg total) by mouth every 8 (eight) hours as needed for nausea or vomiting. 20 tablet 0   SUMAtriptan (IMITREX) 100 MG tablet TAKE 1 TABLET EARLIEST ONSET OF MIGRAINE. MAY REPEAT IN 2 HOURS IF HEADACHE PERSISTS OR RECURS. MAXIMUM 2 TABLETS IN 24 HOURS. 10 tablet 5   triamcinolone cream (KENALOG) 0.1 % Apply 1 Application topically 2  (two) times daily. 30 g 0   TRUEPLUS LANCETS 28G MISC Check blood sugars three time a day 100 each 12   zolpidem (AMBIEN) 5 MG tablet Take 1 tablet (5 mg total) by mouth at bedtime as needed for sleep. 30 tablet 1   [DISCONTINUED] cetirizine (ZYRTEC) 10 MG tablet Take 1 tablet (10 mg total) by mouth daily. (Patient not taking: No sig reported) 30 tablet 1   No current facility-administered medications on file prior to visit.    Allergies  Allergen Reactions   Aspirin Hives   Oxycodone Nausea And Vomiting    Social History   Socioeconomic History   Marital status: Significant Other    Spouse name: Not on file   Number of children: 3   Years of education: 14   Highest education level: Not on file  Occupational History   Not on file  Tobacco Use   Smoking status: Former    Packs/day: 0.25    Years: 8.00    Additional pack years: 0.00    Total pack years: 2.00    Types: Cigarettes    Quit date: 2021    Years since quitting: 3.2   Smokeless tobacco: Never  Vaping Use   Vaping Use: Never used  Substance and Sexual Activity   Alcohol use: Not Currently    Comment: occasional   Drug use: No   Sexual activity: Yes    Birth control/protection: Surgical  Other Topics Concern   Not on file  Social History Narrative   Patient lives at home with mother and father , one story    Patient has 3 children    Patient is single   Patient has 14 years of education    Patient is right handed    Caffeine none   Social Determinants of Health   Financial Resource Strain: Not on file  Food Insecurity: Food Insecurity Present (05/24/2022)   Hunger Vital Sign    Worried About Running Out of Food in the Last Year: Sometimes true    Ran Out of Food in the Last Year: Sometimes true  Transportation Needs:  No Transportation Needs (01/30/2021)   PRAPARE - Hydrologist (Medical): No    Lack of Transportation (Non-Medical): No  Physical Activity: Not on file   Stress: Not on file  Social Connections: Not on file  Intimate Partner Violence: Not on file    Family History  Problem Relation Age of Onset   Diabetes Mother    Mental illness Mother    Depression Mother    Hypertension Mother    Colon cancer Father 24   Hypertension Father    Diabetes Father    Colon cancer Paternal Grandmother    Esophageal cancer Neg Hx    Stomach cancer Neg Hx    Rectal cancer Neg Hx     Past Surgical History:  Procedure Laterality Date   CESAREAN SECTION     UPPER GASTROINTESTINAL ENDOSCOPY     VAGINAL HYSTERECTOMY  12/23/2004   Fibroids, menorrhagia, benign pathology    ROS: Review of Systems Negative except as stated above  PHYSICAL EXAM: BP (!) 141/90 (BP Location: Left Arm, Patient Position: Sitting, Cuff Size: Large)   Pulse (!) 113   Temp 98.1 F (36.7 C) (Oral)   Ht '5\' 3"'$  (1.6 m)   Wt 244 lb (110.7 kg)   SpO2 96%   BMI 43.22 kg/m   Wt Readings from Last 3 Encounters:  03/06/23 244 lb (110.7 kg)  02/13/23 235 lb 12.8 oz (107 kg)  01/17/23 225 lb 1.4 oz (102.1 kg)   Repeat BP 146/98 Physical Exam  General appearance - alert, well appearing, and in no distress.  Her toddler grandson is screaming and acting out intermittently during the visit Mental status - normal mood, behavior, speech, dress, motor activity, and thought processes Chest - clear to auscultation, no wheezes, rales or rhonchi, symmetric air entry Heart - normal rate, regular rhythm, normal S1, S2, no murmurs, rubs, clicks or gallops Extremities - peripheral pulses normal, no pedal edema, no clubbing or cyanosis     12/27/2022    9:47 AM 12/12/2022    9:41 AM 12/12/2022    9:40 AM  Depression screen PHQ 2/9  Decreased Interest  3 3  Down, Depressed, Hopeless  3 3  PHQ - 2 Score  6 6  Altered sleeping  3 3  Tired, decreased energy  3 3  Change in appetite  3 3  Feeling bad or failure about yourself   3 3  Trouble concentrating  3 3  Moving slowly or  fidgety/restless  0 0  Suicidal thoughts  0 0  PHQ-9 Score  21 21  Difficult doing work/chores        Information is confidential and restricted. Go to Review Flowsheets to unlock data.       Latest Ref Rng & Units 01/17/2023    7:12 PM 12/12/2022   10:36 AM 10/31/2022    3:14 PM  CMP  Glucose 70 - 99 mg/dL 209  210    BUN 6 - 20 mg/dL 11  10    Creatinine 0.44 - 1.00 mg/dL 0.91  0.95    Sodium 135 - 145 mmol/L 139  140    Potassium 3.5 - 5.1 mmol/L 4.2  4.5    Chloride 98 - 111 mmol/L 101  100    CO2 22 - 32 mmol/L 29  26    Calcium 8.9 - 10.3 mg/dL 9.4  9.6    Total Protein 6.5 - 8.1 g/dL 7.6   7.5   Total  Bilirubin 0.3 - 1.2 mg/dL 0.5   0.4   Alkaline Phos 38 - 126 U/L 114   164   AST 15 - 41 U/L 47   16   ALT 0 - 44 U/L 68   17    Lipid Panel     Component Value Date/Time   CHOL 177 11/30/2021 1039   TRIG 181 (H) 11/30/2021 1039   HDL 40 11/30/2021 1039   CHOLHDL 4.4 11/30/2021 1039   CHOLHDL 3.2 11/14/2014 0707   VLDL 19 11/14/2014 0707   LDLCALC 105 (H) 11/30/2021 1039    CBC    Component Value Date/Time   WBC 8.7 01/17/2023 1912   RBC 4.74 01/17/2023 1912   HGB 13.5 01/17/2023 1912   HGB 13.3 12/12/2022 1036   HCT 40.3 01/17/2023 1912   HCT 40.4 12/12/2022 1036   PLT 401 (H) 01/17/2023 1912   PLT 436 12/12/2022 1036   MCV 85.0 01/17/2023 1912   MCV 86 12/12/2022 1036   MCH 28.5 01/17/2023 1912   MCHC 33.5 01/17/2023 1912   RDW 14.1 01/17/2023 1912   RDW 12.9 12/12/2022 1036   LYMPHSABS 1.5 01/23/2021 1052   LYMPHSABS 2.4 07/14/2019 1058   MONOABS 0.7 01/23/2021 1052   EOSABS 0.0 01/23/2021 1052   EOSABS 0.1 07/14/2019 1058   BASOSABS 0.0 01/23/2021 1052   BASOSABS 0.0 07/14/2019 1058    ASSESSMENT AND PLAN: 1. Type 2 diabetes mellitus with morbid obesity ([place]) Not at goal. She has seen the endocrinologist and reports she was diagnosed as having type 1 diabetes.  I will wait until I receive note from the endocrinologist plan was to start  insulin pump when she has received the equipment but has not started using it as yet.  Blood sugars based on her continuous glucose monitor and nowhere near goal.  Will have her increase Lantus insulin from 50 units twice a day to 54 units twice a day until she sees her endocrinologist on 1 April.  Discussed on encourage healthy eating habits.  2. Major depressive disorder, recurrent episode, moderate ([place]) She is plugged in with behavioral health services and is on medication.  Denies any suicidal ideations at this time.  3. Pain of upper abdomen Will try referring her to a different surgical group.  I requested that the referral coordinator send her to someone who takes her insurance. - Ambulatory referral to General Surgery  4. Gall bladder disease See #3 above - Ambulatory referral to General Surgery  5. Elevated blood pressure reading without diagnosis of hypertension DASH diet discussed and encouraged.  She is on metoprolol and low-dose Norvasc for different reasons other than blood pressure.  However she has not taken these medicines as yet for the morning.  She will take them when she gets home.  She does have access to a blood pressure device at home.  Advised to check blood pressure at least twice a week and record the readings.     Patient was given the opportunity to ask questions.  Patient verbalized understanding of the plan and was able to repeat key elements of the plan.   This documentation was completed using Radio producer.  Any transcriptional errors are unintentional.  Orders Placed This Encounter  Procedures   Ambulatory referral to General Surgery     Requested Prescriptions    No prescriptions requested or ordered in this encounter    Return in about 4 months (around 07/06/2023) for Appt with [facility] in 4 wks  for BP check.  Karle Plumber, MD, FACP

## 2023-03-06 NOTE — Patient Instructions (Signed)

## 2023-03-07 NOTE — Progress Notes (Unsigned)
NEUROLOGY FOLLOW UP OFFICE NOTE  Whitney Benton [id number]  Assessment/Plan:   1  Migraine without aura, Without Status Migrainosus, Not Intractable 2  Bilateral upper carpal tunnel syndrome   Migraine prevention:  Re-prescribe Aimovig 140mg  every 28 days.  If not formulary, will change to another CGRP inhibitor Migraine rescue:  Sumatriptan 100mg    Take cyclobenzaprine as needed for neck pain Limit use of pain relievers to no more than 2 days out of week to prevent risk of rebound or medication-overuse headache. Keep headache diary Try to optimize glycemic control (Hgb A1c goal less than 8) so can undergo carpal tunnel surgery.  In meantime, use wrist splints.   Follow up 6 months.     Subjective:  Whitney Benton is a 52 year old right-handed female with diabetes who follows up for migraines and bilateral occipital neuralgia.     UPDATE: Since last visit, she hasn't been able to pick up Aimovig.  She was told that refills were "suspended".  Headaches are now daily Intensity:  Moderate to severe Duration:  30 minutes with sumatriptan but will return after a couple of hours Frequency:  daily  Using wrist splints.  Most recent Hgb A1c from December was 10.8.  She is going to get an insulin pump   Rescue therapy:  sumatriptan 100mg    Current NSAIDS:  none - GI bleed Current analgesics:  none Current triptans:  sumatriptan 100mg  Current ergotamine:  none Current anti-emetic:  Zofran ODT 4mg  Current muscle relaxants:  Flexeril 10mg  PRN Current anti-anxiolytic:  hydroxyzine Current sleep aide:  melatonin Current Antihypertensive medications:  metoprolol tartrate Current Antidepressant medications:  Cymbalta 60mg  daily Current Anticonvulsant medications:  none Current anti-CGRP:  none Current Vitamins/Herbal/Supplements:  Melatonin, B12 Current Antihistamines/Decongestants:  none Other therapy:  none Hormone/birth control:  none   Caffeine:  Cut down on coffee.  Now only  decaff.  No soda Diet:  Drinks water with electrolytes.  Does not skip meals Exercise:  Walks 30 minutes daily Depression:  yes; Anxiety:  yes Other pain:  no Sleep hygiene:  poor   HISTORY:  Migraines since 2000 but returned around 2019.  Initial workup in early 2000s included lumbar puncture but unsure of the results.  She doesn't remember if she had an MRI of the brain.  They are severe sharp and pounding pain in back of head bilaterally and radiates to the front.  She has associated flashes in her vision, photophobia, phonophobia, osmophobia, feels off-balance and sometimes nausea but no  Autonomic symptoms, numbness or weakness.  They usually last 2-3 weeks.  They occur about twice a year, usually in Spring and Summer but can occur other times.  Triggers unknown.  Nothing really relieves them.   CT brain on 06/14/2020 personally reviewed was normal. Eye exam in 2021 okay.  In November 2015, she began experiencing left arm weakness and pain.  She reports numbness in the fingertips and palm of both hands.  MRI of cervical spine 11/14/2014 showed multilevel disc degeneration and spondylosis with bilateral mild foraminal stenosis and mild spinal stenosis at C5-6.  She had a NCV-EMG which revealed bilateral carpal tunnel syndrome.  She was referred to a hand specialist at that time but never followed up.  She continued to have some numbness in left hand and later involving the right hand.  NCV-EMG on 07/23/2022 revealed evidence of bilateral moderate to severe carpal tunnel syndrome.  Referred to hand specialist.  Surgery was recommended but not until Hgb A1c  is under 8.0.   Past NSAIDS:  Meloxicam, ketorolac, ibuprofen, naproxen Past analgesics:  Tylenol (elevated liver function), Excedrin Past abortive triptans:  rizatriptan 10mg .  Sumatriptan NS too expensive. Past abortive ergotamine:  none Past muscle relaxants:  Robaxin Past anti-emetic:  promethazine Past antihypertensive medications:   none Past antidepressant medications:  Nortriptyline, sertraline 50mg  Past anticonvulsant medications:  topiramate 100mg  Past anti-CGRP:  none Past vitamins/Herbal/Supplements:  none Past antihistamines/decongestants:  Benadryl, Zyrtec Other past therapies:  none     Family history of headache:  no  PAST MEDICAL HISTORY: Past Medical History:  Diagnosis Date   Anxiety    Asthma    Depression    Diabetes mellitus without complication ([place])    Glaucoma suspect    Trichomonas infection     MEDICATIONS: Current Outpatient Medications on File Prior to Visit  Medication Sig Dispense Refill   albuterol (VENTOLIN HFA) 108 (90 Base) MCG/ACT inhaler Inhale 2 puffs into the lungs every 6 (six) hours as needed for wheezing or shortness of breath (Cough). 18 g 0   atorvastatin (LIPITOR) 40 MG tablet Take 1 tablet (40 mg total) by mouth daily. 90 tablet 2   Blood Glucose Monitoring Suppl (TRUE METRIX METER) w/Device KIT Check blood sugars three times a day 1 kit 0   busPIRone (BUSPAR) 15 MG tablet Take 1 tablet (15 mg total) by mouth 3 (three) times daily. 90 tablet 3   conjugated estrogens (PREMARIN) vaginal cream Apply 0.5 gram intravaginally 2 times a week 42.5 g 1   Continuous Blood Gluc Receiver (FREESTYLE LIBRE 2 READER) DEVI Use to check blood sugar three times daily. 1 each 0   Continuous Blood Gluc Sensor (FREESTYLE LIBRE 2 SENSOR) MISC Use to check blood sugar three times daily. 2 each 3   Continuous Blood Gluc Sensor (FREESTYLE LIBRE 2 SENSOR) MISC take as directed 1 each 0   cycloSPORINE (RESTASIS) 0.05 % ophthalmic emulsion Apply 1 drop into both eyes twice a day 180 each 3   diclofenac (VOLTAREN) 75 MG EC tablet Take 1 tablet (75 mg total) by mouth 2 (two) times daily. 30 tablet 2   Dulaglutide (TRULICITY) 4.5 0000000 SOPN Inject 4.5 mg as directed once a week. 2 mL 3   DULoxetine (CYMBALTA) 20 MG capsule Take 1 capsule (20 mg total) by mouth daily. 30 capsule 3   DULoxetine  (CYMBALTA) 60 MG capsule Take 1 capsule (60 mg total) by mouth daily. 30 capsule 3   Erenumab-aooe (AIMOVIG) 140 MG/ML SOAJ Inject 140 mg into the skin every 28 (twenty-eight) days. 1.12 mL 5   fexofenadine (ALLEGRA) 180 MG tablet Take 1 tablet (180 mg total) by mouth daily. 90 tablet 1   fluorometholone (FML) 0.1 % ophthalmic suspension Instill 1 drop into both eyes as directed 1 drop 4 times day for 2 weeks then 1 drop 2 times day for 2 weeks 10 mL 0   fluticasone (FLONASE) 50 MCG/ACT nasal spray Place 1 spray into both nostrils daily. Begin by using 2 sprays in each nare daily for 3 to 5 days, then decrease to 1 spray in each nare daily. 32 mL 1   Galcanezumab-gnlm (EMGALITY) 120 MG/ML SOAJ Inject 120 mg into the skin every 28 (twenty-eight) days. 1.12 mL 5   glucose blood (TRUE METRIX BLOOD GLUCOSE TEST) test strip Use to check blood sugar 3 times daily 100 each 2   hydrOXYzine (ATARAX) 25 MG tablet TAKE 1 TABLET (25 MG TOTAL) BY MOUTH 3 (THREE) TIMES DAILY AS  NEEDED. 90 tablet 3   insulin aspart (NOVOLOG FLEXPEN) 100 UNIT/ML FlexPen INJECT 25 UNITS BEFORE BREAKFAST, 20 UNITS BEFORE LUNCH, AND 25 UNITS BEFORE DINNER. 15 mL 2   Insulin Glargine (BASAGLAR KWIKPEN) 100 UNIT/ML Inject 67 Units into the skin at bedtime. 21 mL 2   Insulin Pen Needle (PEN NEEDLES) 31G X 6 MM MISC Use as directed 100 each 0   metFORMIN (GLUCOPHAGE-XR) 500 MG 24 hr tablet Take 1 tablet (500 mg total) by mouth 2 (two) times daily. 180 tablet 1   methocarbamol (ROBAXIN) 500 MG tablet Take 1 tablet (500 mg total) by mouth 3 (three) times daily as needed for muscle spasms. 20 tablet 2   metoprolol tartrate (LOPRESSOR) 25 MG tablet Take 0.5 tablets (12.5 mg total) by mouth 2 (two) times daily. 90 tablet 2   Multiple Vitamin (MULTIVITAMIN ADULT) TABS Take 1 tablet by mouth daily.     Multiple Vitamins-Minerals (EYE VITAMINS PO) Take by mouth.     ondansetron (ZOFRAN-ODT) 4 MG disintegrating tablet Take 1 tablet (4 mg total) by  mouth every 8 (eight) hours as needed for nausea or vomiting. 20 tablet 0   predniSONE (STERAPRED UNI-PAK 21 TAB) 5 MG (21) TBPK tablet Take as directed 21 tablet 0   QUEtiapine (SEROQUEL) 100 MG tablet Take 1 tablet (100 mg total) by mouth at bedtime. 30 tablet 3   SUMAtriptan (IMITREX) 100 MG tablet TAKE 1 TABLET EARLIEST ONSET OF MIGRAINE. MAY REPEAT IN 2 HOURS IF HEADACHE PERSISTS OR RECURS. MAXIMUM 2 TABLETS IN 24 HOURS. 10 tablet 5   triamcinolone cream (KENALOG) 0.1 % Apply 1 Application topically 2 (two) times daily. 30 g 0   TRUEPLUS LANCETS 28G MISC Check blood sugars three time a day 100 each 12   zolpidem (AMBIEN) 5 MG tablet Take 1 tablet (5 mg total) by mouth at bedtime as needed for sleep. 30 tablet 1   [DISCONTINUED] cetirizine (ZYRTEC) 10 MG tablet Take 1 tablet (10 mg total) by mouth daily. (Patient not taking: No sig reported) 30 tablet 1   No current facility-administered medications on file prior to visit.    ALLERGIES: Allergies  Allergen Reactions   Aspirin Hives   Oxycodone Nausea And Vomiting    FAMILY HISTORY: Family History  Problem Relation Age of Onset   Diabetes Mother    Mental illness Mother    Depression Mother    Hypertension Mother    Colon cancer Father 85   Hypertension Father    Diabetes Father    Colon cancer Paternal Grandmother    Esophageal cancer Neg Hx    Stomach cancer Neg Hx       Objective:  Blood pressure 118/82, pulse (!) 119, height 5\' 3"  (1.6 m), weight 247 lb (112 kg), SpO2 94 %. General: No acute distress.  Patient appears well-groomed.   Head:  Normocephalic/atraumatic Eyes:  Fundi examined but not visualized Neck: supple, no paraspinal tenderness, full range of motion Heart:  Regular rate and rhythm Neurological Exam: alert and oriented to person, place, and time.  Speech fluent and not dysarthric, language intact.  CN II-XII intact. Bulk and tone normal, muscle strength 5/5 throughout.  Sensation to light touch intact.   Deep tendon reflexes 2+ throughout.  Finger to nose testing intact.  Gait normal, Romberg negative.   Metta Clines, DO  CC: Karle Plumber, MD

## 2023-03-08 ENCOUNTER — Other Ambulatory Visit: Payer: Self-pay | Admitting: Nurse Practitioner

## 2023-03-10 ENCOUNTER — Other Ambulatory Visit: Payer: Self-pay

## 2023-03-11 ENCOUNTER — Telehealth: Payer: Self-pay | Admitting: Anesthesiology

## 2023-03-11 ENCOUNTER — Encounter: Payer: Self-pay | Admitting: Neurology

## 2023-03-11 ENCOUNTER — Telehealth: Payer: Self-pay

## 2023-03-11 ENCOUNTER — Ambulatory Visit (INDEPENDENT_AMBULATORY_CARE_PROVIDER_SITE_OTHER): Payer: Medicaid Other | Admitting: Neurology

## 2023-03-11 VITALS — BP 118/82 | HR 119 | Ht 63.0 in | Wt 247.0 lb

## 2023-03-11 DIAGNOSIS — G5603 Carpal tunnel syndrome, bilateral upper limbs: Secondary | ICD-10-CM

## 2023-03-11 DIAGNOSIS — G43709 Chronic migraine without aura, not intractable, without status migrainosus: Secondary | ICD-10-CM

## 2023-03-11 MED ORDER — AIMOVIG 140 MG/ML ~~LOC~~ SOAJ: 140.0000 mg | SUBCUTANEOUS | 5 refills | Status: DC

## 2023-03-11 MED ORDER — SUMATRIPTAN SUCCINATE 100 MG PO TABS: ORAL_TABLET | ORAL | 5 refills | Status: DC

## 2023-03-11 NOTE — Patient Instructions (Signed)
Restart Aimovig.  If not formulary, will switch to a similar injection that is formulary Sumatriptan as needed.  alert and oriented to person, place, and time.  Speech fluent and not dysarthric, language intact.  CN II-XII intact. Bulk and tone normal, muscle strength 5/5 throughout.  Sensation to light touch intact.  Deep tendon reflexes 2+ throughout.  Finger to nose testing intact.  Gait normal, Romberg negative. Take cyclobenzaprine as needed when neck acts up Follow up 6 months.

## 2023-03-11 NOTE — Telephone Encounter (Signed)
Sent to PA team for Aimovig and sumatriptan

## 2023-03-11 NOTE — Telephone Encounter (Signed)
Pt called stating both of her Rx's Sumatriptan and Aimovig need a PA.

## 2023-03-12 ENCOUNTER — Ambulatory Visit (INDEPENDENT_AMBULATORY_CARE_PROVIDER_SITE_OTHER): Payer: Medicaid Other | Admitting: Clinical

## 2023-03-12 ENCOUNTER — Ambulatory Visit: Payer: Medicaid Other

## 2023-03-12 DIAGNOSIS — F331 Major depressive disorder, recurrent, moderate: Secondary | ICD-10-CM

## 2023-03-13 ENCOUNTER — Ambulatory Visit: Payer: Medicaid Other

## 2023-03-14 ENCOUNTER — Ambulatory Visit (HOSPITAL_COMMUNITY): Payer: Medicaid Other | Attending: Cardiology

## 2023-03-14 DIAGNOSIS — R072 Precordial pain: Secondary | ICD-10-CM | POA: Insufficient documentation

## 2023-03-14 LAB — ECHOCARDIOGRAM COMPLETE
Area-P 1/2: 3.39 cm2
S' Lateral: 3.85 cm

## 2023-03-15 ENCOUNTER — Encounter: Payer: Self-pay | Admitting: Family

## 2023-03-15 ENCOUNTER — Ambulatory Visit: Payer: Medicaid Other | Admitting: Family

## 2023-03-15 ENCOUNTER — Ambulatory Visit: Payer: Medicaid Other | Attending: Internal Medicine | Admitting: Family

## 2023-03-15 VITALS — BP 130/88 | HR 108 | Resp 16 | Wt 246.4 lb

## 2023-03-15 DIAGNOSIS — L601 Onycholysis: Secondary | ICD-10-CM

## 2023-03-15 DIAGNOSIS — F32A Depression, unspecified: Secondary | ICD-10-CM

## 2023-03-15 DIAGNOSIS — F419 Anxiety disorder, unspecified: Secondary | ICD-10-CM | POA: Diagnosis not present

## 2023-03-15 NOTE — Progress Notes (Signed)
Patient has been counseled on age-appropriate routine health concerns for screening and prevention. These are reviewed and up-to-date. Referrals have been placed accordingly. Immunizations are up-to-date or declined.    Subjective:   Chief Complaint  Patient presents with   Nail Problem    Left Pointer finger  Noticed it when her finger started swelling  Patient is a 52 year old female known to this practice who presents to the clinic this morning with complaints of nail problem to her left pointer finger.  Patient reports earlier this week her left finger was swollen around her left index nail, the skin started peeling off around the nail.  A few days later the whole nail started coming off starting with its bed.  Patient did not have pain, redness, fever, chills, or any other symptom.  Now the whole nail is coming off starting with its bed.  Patient has no limited to the use of her left index finger all left hand, has no pain, but mostly concerned about the nail loss. This is never happened to her before.  Patient has history of depression and anxiety and she is under the care of of a local behavioral health provider.  She has no suicidal ideation or attempts.  Patient also has history of diabetes, and is well-managed by her primary care providers.  Past Medical History:  Diagnosis Date   Allergy    Anxiety    Arthritis    Asthma    Depression    Diabetes mellitus without complication ([facility])    GERD (gastroesophageal reflux disease)    Glaucoma suspect    Hyperlipidemia    Trichomonas infection     Past Surgical History:  Procedure Laterality Date   CESAREAN SECTION     UPPER GASTROINTESTINAL ENDOSCOPY     VAGINAL HYSTERECTOMY  12/23/2004   Fibroids, menorrhagia, benign pathology    Family History  Problem Relation Age of Onset   Diabetes Mother    Mental illness Mother    Depression Mother    Hypertension Mother    Colon cancer Father 77   Hypertension Father     Diabetes Father    Colon cancer Paternal Grandmother    Esophageal cancer Neg Hx    Stomach cancer Neg Hx    Rectal cancer Neg Hx     Social History Reviewed with no changes to be made today.   Outpatient Medications Prior to Visit  Medication Sig Dispense Refill   acetaminophen (TYLENOL) 500 MG tablet Take 1 tablet (500 mg total) by mouth every 6 (six) hours as needed. 30 tablet 0   acetaminophen-codeine (TYLENOL #3) 300-30 MG tablet Take 1 tablet by mouth 2 (two) times daily as needed for moderate pain. 30 tablet 0   albuterol (VENTOLIN HFA) 108 (90 Base) MCG/ACT inhaler Inhale 2 puffs into the lungs every 6 (six) hours as needed for wheezing or shortness of breath (Cough). 18 g 0   amLODipine (NORVASC) 2.5 MG tablet Take 1 tablet (2.5 mg total) by mouth daily. 90 tablet 2   atorvastatin (LIPITOR) 40 MG tablet Take 1 tablet (40 mg total) by mouth daily. 90 tablet 2   Blood Glucose Monitoring Suppl (TRUE METRIX METER) w/Device KIT Check blood sugars three times a day 1 kit 0   busPIRone (BUSPAR) 15 MG tablet Take 1 tablet (15 mg total) by mouth 3 (three) times daily. 90 tablet 3   Continuous Blood Gluc Receiver (FREESTYLE LIBRE 2 READER) DEVI Use to check blood sugar three  times daily. 1 each 0   Continuous Blood Gluc Sensor (FREESTYLE LIBRE 3 SENSOR) MISC Place 1 sensor every 14 (fourteen) days. 2 each 11   cycloSPORINE (RESTASIS) 0.05 % ophthalmic emulsion Apply 1 drop into both eyes twice a day (Patient not taking: Reported on 03/15/2023) 180 each 3   doxepin (SINEQUAN) 25 MG capsule Take 1 capsule (25 mg total) by mouth at bedtime. 30 capsule 3   DULoxetine (CYMBALTA) 20 MG capsule Take 1 capsule (20 mg total) by mouth daily. 30 capsule 3   DULoxetine (CYMBALTA) 60 MG capsule Take 1 capsule (60 mg total) by mouth daily. 30 capsule 3   Erenumab-aooe (AIMOVIG) 140 MG/ML SOAJ Inject 140 mg into the skin every 28 (twenty-eight) days. 1.12 mL 5   Estradiol 10 MCG TABS vaginal tablet Insert  one tab intravaginally 2 times a week 8 tablet 4   fluticasone (FLONASE) 50 MCG/ACT nasal spray Place 1 spray into both nostrils daily. Begin by using 2 sprays in each nare daily for 3 to 5 days, then decrease to 1 spray in each nare daily. 32 mL 1   gabapentin (NEURONTIN) 300 MG capsule Take 1 capsule (300 mg total) by mouth 3 (three) times daily. 90 capsule 3   glucose blood (TRUE METRIX BLOOD GLUCOSE TEST) test strip Use to check blood sugar 3 times daily 100 each 2   hydrOXYzine (ATARAX) 25 MG tablet TAKE 1 TABLET (25 MG TOTAL) BY MOUTH 3 (THREE) TIMES DAILY AS NEEDED. 90 tablet 3   insulin aspart (NOVOLOG FLEXPEN) 100 UNIT/ML FlexPen Inject 30 Units into the skin 3 (three) times daily with meals. (Patient not taking: Reported on 03/15/2023) 15 mL 3   insulin glargine (LANTUS SOLOSTAR) 100 UNIT/ML Solostar Pen Inject 54 Units into the skin 2 (two) times daily. (Patient not taking: Reported on 03/15/2023) 90 mL 1   insulin lispro (HUMALOG) 100 UNIT/ML injection Inject 1 mL (100 Units total) via pump once daily. 30 mL 5   Insulin Pen Needle (PEN NEEDLES) 31G X 6 MM MISC Use as directed 100 each 0   lidocaine (LIDODERM) 5 % Place 1 patch onto the skin daily. Remove & Discard patch within 12 hours or as directed by MD 30 patch 0   metFORMIN (GLUCOPHAGE-XR) 500 MG 24 hr tablet Take 1 tablet by mouth twice daily 180 tablet 0   methocarbamol (ROBAXIN) 500 MG tablet Take 1 tablet (500 mg total) by mouth 3 (three) times daily as needed for muscle spasms. 20 tablet 2   metoprolol tartrate (LOPRESSOR) 25 MG tablet Take 0.5 tablets (12.5 mg total) by mouth 2 (two) times daily. 90 tablet 2   Multiple Vitamins-Minerals (EYE VITAMINS PO) Take 1 tablet by mouth daily.     OLANZapine (ZYPREXA) 15 MG tablet Take 1 tablet (15 mg total) by mouth at bedtime. (Patient not taking: Reported on 03/15/2023) 30 tablet 3   omeprazole (PRILOSEC) 40 MG capsule TAKE 1 CAPSULE BY MOUTH ONCE DAILY 30 MINUTES BEFORE BREAKFAST 90  capsule 2   ondansetron (ZOFRAN-ODT) 4 MG disintegrating tablet Take 1 tablet (4 mg total) by mouth every 8 (eight) hours as needed for nausea or vomiting. 20 tablet 0   SUMAtriptan (IMITREX) 100 MG tablet TAKE 1 TABLET EARLIEST ONSET OF MIGRAINE. MAY REPEAT IN 2 HOURS IF HEADACHE PERSISTS OR RECURS. MAXIMUM 2 TABLETS IN 24 HOURS. 10 tablet 5   triamcinolone cream (KENALOG) 0.1 % Apply 1 Application topically 2 (two) times daily. 30 g 0   TRUEPLUS  LANCETS 28G MISC Check blood sugars three time a day 100 each 12   zolpidem (AMBIEN) 5 MG tablet Take 1 tablet (5 mg total) by mouth at bedtime as needed for sleep. 30 tablet 1   No facility-administered medications prior to visit.    Allergies  Allergen Reactions   Aspirin Hives   Oxycodone Nausea And Vomiting       Objective:    BP 130/88   Pulse (!) 108   Resp 16   Wt 246 lb 6.4 oz (111.8 kg)   SpO2 95%   BMI 43.65 kg/m  Wt Readings from Last 3 Encounters:  03/15/23 246 lb 6.4 oz (111.8 kg)  03/11/23 247 lb (112 kg)  03/06/23 244 lb (110.7 kg)    Physical Exam Constitutional:      Appearance: Normal appearance.  Pulmonary:     Effort: Pulmonary effort is normal.     Breath sounds: Normal breath sounds.  Abdominal:     General: Bowel sounds are normal.     Palpations: Abdomen is soft.  Musculoskeletal:        General: Normal range of motion.  Skin:    General: Skin is warm.     Comments: Nail bed to left index is nonintact.  No redness, discharge, or any other sign of infection.  Patient is able to flex and extend left index finger and left wrist without pain/limit.  Neurological:     General: No focal deficit present.     Mental Status: She is alert and oriented to person, place, and time.  Psychiatric:        Mood and Affect: Mood normal.        Behavior: Behavior normal.    Assessment & Plan:  This Costabile is a 52 year old female who is here this morning with complaints of broken nail to her left index finger.  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  Patient is referred to dermatology and instructed to report new or worsening symptoms to the clinic or local ED.  She will also have follow-up with primary care provider in 3 months.  For her anxieties and depression patient is under the care of of her behavioral health providers, she has no symptoms today, will follow-up as indicated.  PLAN: 1) Onycholysis:  -Patient instructed to keep the nail and its bed dry  -Reminded to report new or worsening symptoms to the clinic or local ED  -Patient referred to local dermatologist.  -Follow-up in 3 months  2) anxiety and depression:  -Follow-up with behavioral health as scheduled  -Report new symptoms to the clinic or local ED  -Take the medications as prescribed   Patient has been counseled extensively about nutrition and exercise as well as the importance of adherence with medications and regular follow-up. The patient was given clear instructions to go to ER or return to medical center if symptoms don't improve, worsen or new problems develop. The patient verbalized understanding.    Follow-up: Return in about 3 months (around 06/15/2023).     Velta Addison, DNP, APRN, FNP-C  [facility] [place], [place]   03/15/2023, 1:46 PM

## 2023-03-15 NOTE — Progress Notes (Signed)
   THERAPIST PROGRESS NOTE  Session Time: 40 minutes  Participation Level: Active  Behavioral Response: CasualAlertAnxious  Type of Therapy: Individual Therapy  Treatment Goals addressed: client will participate in 80% of assigned homework  ProgressTowards Goals: Progressing  Interventions: CBT and Supportive  Summary:  Whitney Benton is a 52 y.o. female who presents for the scheduled appointment oriented times five, appropriately dressed and friendly. Client denied hallucinations and delusions. Client reported she has been having stress within her household. Client reported her sisters boyfriend had a mental health breakdown. Client reported he was acting aggressively and threatening to cause harm to her sister. Client reported the had him brought to the [facility] urgent care. Client reported he is now back in the house and it is hard to keep the space clean because they do not help keep things organized. Client reported she also had a hearing for her disability and was denied. Client reported she was disappointed but she is going to get a new lawyer to help her appeal. Client reported she would not be able to sustain work due to her pain/ physical ailments as well as her health overall. Client reported her doctors want to put her on a pump for her diabetes. Client reported on today as she was waiting her blood sugar dropped and needed some candy. Client reported she has been experiencing episodes of that happening which has not occurred before. Client reported otherwise she has been trying to slowly get her home together as she would like and spend time taking better care of her herself such as having some dental work done. Client reported she has not been abe to go visit her parents grave site as she would like and that bothers her. Client reported she also continues to struggle with getting adequate sleep even with the increased dosage given to her by the psychiatrist.  Evidence of progress  towards goal:  client reported medication compliance 7 days per week.   Suicidal/Homicidal: Nowithout intent/plan  Therapist Response:  Therapist began the appointment asking the client how she has been doing since last seen. Therapist used CBT to engage using active listening and positive emotional support. Therapist used CBT to engage and give the client time to discuss her stressors. Therapist used CBT to engage normalize the clients thoughts and feelings. Therapist used CBT to encourage the client to update her doctor about her health symptoms. Therapist used CBT to reinforce stress management techniques. Therapist used CBT ask the client to identify her progress with frequency of use with coping skills with continued practice in her daily activity.    Therapist assigned the client homework to practice self care.    Plan: Return again in 3 weeks.  Diagnosis: major depressive disorder,recurrent episode, moderate  Collaboration of Care: Patient refused AEB none requested by the client.  Patient/Guardian was advised Release of Information must be obtained prior to any record release in order to collaborate their care with an outside provider. Patient/Guardian was advised if they have not already done so to contact the registration department to sign all necessary forms in order for [place] to release information regarding their care.   Consent: Patient/Guardian gives verbal consent for treatment and assignment of benefits for services provided during this visit. Patient/Guardian expressed understanding and agreed to proceed.   [place], LCSW 03/12/2023

## 2023-03-18 ENCOUNTER — Other Ambulatory Visit: Payer: Self-pay | Admitting: *Deleted

## 2023-03-18 ENCOUNTER — Ambulatory Visit: Payer: Medicaid Other

## 2023-03-18 DIAGNOSIS — M79672 Pain in left foot: Secondary | ICD-10-CM

## 2023-03-18 DIAGNOSIS — M79671 Pain in right foot: Secondary | ICD-10-CM

## 2023-03-18 DIAGNOSIS — R2689 Other abnormalities of gait and mobility: Secondary | ICD-10-CM

## 2023-03-18 MED ORDER — METOPROLOL TARTRATE 25 MG PO TABS: 12.5000 mg | ORAL_TABLET | Freq: Two times a day (BID) | ORAL | 2 refills | Status: DC

## 2023-03-18 NOTE — Therapy (Unsigned)
OUTPATIENT PHYSICAL THERAPY TREATMENT NOTE/Re-Cert   Patient Name: Whitney Benton MRN: [id number] DOB:[birth year], 52 y.o., female Today's Date: 03/19/2023  PCP: Ladell Pier, MD  REFERRING PROVIDER: Yevonne Pax, DPM   END OF SESSION:   PT End of Session - 03/18/23 1720     Visit Number 14    Number of Visits 17    Date for PT Re-Evaluation 03/06/23    Authorization Type [employer]    PT Start Time 1713   arrived late   PT Stop Time 1742    PT Time Calculation (min) 29 min    Activity Tolerance Patient tolerated treatment well    Behavior During Therapy WFL for tasks assessed/performed                Past Medical History:  Diagnosis Date   Allergy    Anxiety    Arthritis    Asthma    Depression    Diabetes mellitus without complication ([place])    GERD (gastroesophageal reflux disease)    Glaucoma suspect    Hyperlipidemia    Trichomonas infection    Past Surgical History:  Procedure Laterality Date   CESAREAN SECTION     UPPER GASTROINTESTINAL ENDOSCOPY     VAGINAL HYSTERECTOMY  12/23/2004   Fibroids, menorrhagia, benign pathology   Patient Active Problem List   Diagnosis Date Noted   Hyperlipidemia associated with type 2 diabetes mellitus ([place]) 03/06/2023   Severe recurrent major depressive disorder with psychotic features ([place]) 12/27/2022   Primary insomnia 12/27/2022   Morbid obesity ([place]) 04/04/2022   Former smoker 04/30/2021   Major depressive disorder, recurrent episode, moderate ([place]) 04/30/2021   Substance induced mood disorder ([place]) 03/22/2021   Generalized anxiety disorder 03/22/2021   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   DKA (diabetic ketoacidosis) ([place]) 01/23/2021   Glaucoma suspect 04/12/2020   DKA, type 2, not at goal [facility]) 10/02/2019   AKI (acute kidney injury) ([place]) 10/02/2019   Hyperkalemia 10/02/2019   Leukocytosis 10/02/2019   Abnormal LFTs 10/02/2019   Stressful life events affecting family and  household 08/06/2019   New onset type 2 diabetes mellitus ([place]) 02/04/2019   Obesity (BMI 35.0-39.9 without comorbidity) 02/04/2019   Tobacco dependence 02/04/2019   Left arm weakness 12/06/2014   Paresthesias/numbness 12/06/2014   Tobacco abuse 11/14/2014   Depression    Mixed incontinence 06/27/2014   History of TVH in 2006 for fibroids and menorrhagia; benign pathology 09/08/2011    REFERRING DIAG: M72.2 (ICD-10-CM) - Plantar fasciitis, bilateral    THERAPY DIAG:  Pain in right foot  Pain in left foot  Other abnormalities of gait and mobility  Rationale for Evaluation and Treatment Rehabilitation  PERTINENT HISTORY: Anxiety, Depression, DMII   PRECAUTIONS: None  SUBJECTIVE:  SUBJECTIVE STATEMENT:  Pt presents to PT with continued pain in bilateral LE. Has been compliant with HEP.   PAIN:  Are you having pain?  Yes: NPRS scale: 7-8/10 Worst: 10/10 Pain location: bilateral heels Pain description: sharp Aggravating factors: stairs, first step out of bed Relieving factors: none   OBJECTIVE: (objective measures completed at initial evaluation unless otherwise dated)   DIAGNOSTIC FINDINGS:             See imaging    PATIENT SURVEYS:  FOTO: 38% function; 52% predicted - 02/06/23   COGNITION: Overall cognitive status: Within functional limits for tasks assessed                         SENSATION: Not tested   POSTURE: rounded shoulders, forward head, and larger body habitus   PALPATION: TTP to bilateral heels, bilateral gastroc   LOWER EXTREMITY ROM:   Active ROM Right eval Left eval Right 01/16/23 Left 01/16/23 Right 02/06/23 Left 02/06/23  Hip flexion          Hip extension          Hip abduction          Hip adduction          Hip internal rotation          Hip external  rotation          Knee flexion          Knee extension          Ankle dorsiflexion -5 -10 -1 -8 0 -8  Ankle plantarflexion          Ankle inversion          Ankle eversion           (Blank rows = not tested)   LOWER EXTREMITY MMT:   MMT Right eval Left eval  Hip flexion      Hip extension      Hip abduction      Hip adduction      Hip internal rotation      Hip external rotation      Knee flexion      Knee extension      Ankle dorsiflexion      Ankle plantarflexion 4/5 3+/5 p!  Ankle inversion      Ankle eversion       (Blank rows = not tested)   LOWER EXTREMITY SPECIAL TESTS:  DNT   FUNCTIONAL TESTS:  Five Time Sit to Stand: 16 seconds - 02/06/23 SLS: 5" R  5" L   GAIT: Distance walked: 43ft Assistive device utilized: None Level of assistance: Complete [place] Comments: antalgica gait L, decreased heel strike   TREATMENT: [facility] Adult PT Treatment:                                                DATE: 02/27/23 Therapeutic Exercise: Nustep level 6 x 5 min  Slant board gastroc stretch x 60" Slant board soleus stretch x 60" Heel raise with ball 2x15 Ankle inv/ev 2x10 RTB Manual Therapy: STM/MTPR BIL gastroc  [facility] Adult PT Treatment:  DATE: 02/18/23 Therapeutic Exercise: Nustep level 6 x 6 mins Slant board gastroc stretch x 60" Slant board soleus stretch x 60" Heel raises on wall 2x10 Toe raises on wall 2x10 Tandem on foam 2x30" each Mini squat on foam 2x10 Tandem walking on Airex with single UE support x3 laps BAPS L3 x 10 cw/ccw each Manual Therapy: STM/MTPR BIL gastroc  [facility] Adult PT Treatment:                                                DATE: 02/06/2023 Therapeutic Activity: Assessment of tests/measures, goals, and outcomes for recert Manual Therapy: STM/MTPR BIL gastroc  [facility] Adult PT Treatment:                                                DATE: 01/30/2023 Therapeutic Exercise: Nustep level  6 x 5 mins Slant board gastroc stretch x 60" Slant board soleus stretch x 60" Heel toe raises x 20 Tandem on foam 2x30" each Mini squat on foam 2x10 Tandem walking on Airex with single UE support x3 laps BAPS L3 x 10 cw/ccw each STS 2x10  Manual Therapy: STM/MTPR BIL gastroc    PATIENT EDUCATION:  Education details: eval findings, FOTO, HEP, POC Person educated: Patient Education method: Explanation, Demonstration, and Handouts Education comprehension: verbalized understanding and returned demonstration   HOME EXERCISE PROGRAM: Access Code: [facility] URL: [email or web address] Date: 12/11/2022 Prepared by: Octavio Manns   Exercises - Long Sitting Calf Stretch with Strap  - 2 x daily - 7 x weekly - 2-3 reps - 30 sec hold - Towel Scrunches  - 2 x daily - 7 x weekly - 2 reps - 60 sec hold - Seated Plantar Fascia Mobilization with Small Ball  - 2 x daily - 7 x weekly - 3 sets - 10 reps - Ankle and Toe Plantarflexion with Resistance  - 2 x daily - 7 x weekly - 3 sets - 10 reps - blue theraband hold   ASSESSMENT:   CLINICAL IMPRESSION: Pt was able to complete prescribed exercises with no adverse effect or increase in pain. Once again responded well to manual therapy interventions. Pt progressing well, will continue per POC.   OBJECTIVE IMPAIRMENTS: Abnormal gait, decreased activity tolerance, decreased mobility, difficulty walking, decreased ROM, decreased strength, and pain.    ACTIVITY LIMITATIONS: standing, squatting, stairs, transfers, and locomotion level   PARTICIPATION LIMITATIONS: meal prep, cleaning, laundry, driving, community activity, and yard work   PERSONAL FACTORS: Fitness and 3+ comorbidities: Anxiety, Depression, DMII  are also affecting patient's functional outcome.      GOALS: Goals reviewed with patient? No   SHORT TERM GOALS: Target date: 01/01/2023   Pt will be compliant and knowledgeable with initial HEP for improved comfort and  carryover Baseline: initial HEP given  Goal status: MET   2.  Pt will self report bilateral heel pain no greater than 6/10 for improved comfort and functional ability Baseline: 10/10 at worst Goal status: Ongoing Pt reports 10/10 at worst 01/07/23   LONG TERM GOALS: Target date: 03/06/2023   Pt will self report bilateral heel pain no greater than 3/10 for improved comfort and functional ability Baseline: 10/10 at worst Goal status: ONGOING   2.  Pt will improve FOTO function score to no less than 52% as proxy for functional improvement Baseline: 28% function 02/06/2023: 38% function Goal status: ONGOING   3.  Pt will improve Five Time Sit to Stand to no greater than 15 seconds for improved balance and functional mobility Baseline: 22 seconds with pain 02/06/2023: 16 seconds Goal status: PROGRESSING   4.  Pt will improve bilateral ankle DF to no less than 5 for improved gait and decreased pain  Baseline: see chart Goal status: IONGOING   5.  Pt will improve bilateral SLS time to no less than 15 seconds for improved balance and ankle stability Baseline: 4" R  2" L - (L painful) Goal status: ONGOING     PLAN:   PT FREQUENCY: 2x/week   PT DURATION: 8 weeks   PLANNED INTERVENTIONS: Therapeutic exercises, Therapeutic activity, Neuromuscular re-education, Balance training, Gait training, Patient/Family education, Self Care, Joint mobilization, Aquatic Therapy, Dry Needling, Electrical stimulation, Cryotherapy, Moist heat, Manual therapy, and Re-evaluation   PLAN FOR NEXT SESSION: assess HEP response, ankle strengthening, calf stretching, TPDN?   Ward Chatters, PT 03/19/2023, 8:23 AM

## 2023-03-19 ENCOUNTER — Encounter (HOSPITAL_COMMUNITY): Payer: Self-pay | Admitting: Psychiatry

## 2023-03-19 ENCOUNTER — Telehealth (INDEPENDENT_AMBULATORY_CARE_PROVIDER_SITE_OTHER): Payer: Medicaid Other | Admitting: Psychiatry

## 2023-03-19 DIAGNOSIS — F333 Major depressive disorder, recurrent, severe with psychotic symptoms: Secondary | ICD-10-CM | POA: Diagnosis not present

## 2023-03-19 DIAGNOSIS — F411 Generalized anxiety disorder: Secondary | ICD-10-CM | POA: Diagnosis not present

## 2023-03-19 DIAGNOSIS — F1994 Other psychoactive substance use, unspecified with psychoactive substance-induced mood disorder: Secondary | ICD-10-CM | POA: Diagnosis not present

## 2023-03-19 DIAGNOSIS — F5101 Primary insomnia: Secondary | ICD-10-CM | POA: Diagnosis not present

## 2023-03-19 DIAGNOSIS — K8020 Calculus of gallbladder without cholecystitis without obstruction: Secondary | ICD-10-CM | POA: Diagnosis not present

## 2023-03-19 MED ORDER — HYDROXYZINE HCL 25 MG PO TABS: ORAL_TABLET | Freq: Three times a day (TID) | ORAL | 3 refills | Status: DC | PRN

## 2023-03-19 MED ORDER — GABAPENTIN 300 MG PO CAPS: 300.0000 mg | ORAL_CAPSULE | Freq: Three times a day (TID) | ORAL | 3 refills | Status: DC

## 2023-03-19 MED ORDER — DULOXETINE HCL 60 MG PO CPEP: 60.0000 mg | ORAL_CAPSULE | Freq: Every day | ORAL | 3 refills | Status: DC

## 2023-03-19 MED ORDER — LYBALVI 20-10 MG PO TABS: 20.0000 mg | ORAL_TABLET | Freq: Every evening | ORAL | 3 refills | Status: DC

## 2023-03-19 MED ORDER — BUSPIRONE HCL 30 MG PO TABS: 60.0000 mg | ORAL_TABLET | Freq: Three times a day (TID) | ORAL | 3 refills | Status: DC

## 2023-03-19 MED ORDER — LYBALVI 15-10 MG PO TABS: 15.0000 mg | ORAL_TABLET | Freq: Every evening | ORAL | 3 refills | Status: DC

## 2023-03-19 MED ORDER — ZOLPIDEM TARTRATE 5 MG PO TABS: 5.0000 mg | ORAL_TABLET | Freq: Every evening | ORAL | 1 refills | Status: DC | PRN

## 2023-03-19 MED ORDER — DULOXETINE HCL 20 MG PO CPEP: 20.0000 mg | ORAL_CAPSULE | Freq: Every day | ORAL | 3 refills | Status: DC

## 2023-03-19 NOTE — Progress Notes (Signed)
[facility] MD/PA/NP OP Progress Note Virtual Visit via Video Note  I connected with Whitney Benton on 03/19/23 at  1:00 PM EDT by a video enabled telemedicine application and verified that I am speaking with the correct person using two identifiers.  Location: Patient: Home Provider: Clinic   I discussed the limitations of evaluation and management by telemedicine and the availability of in person appointments. The patient expressed understanding and agreed to proceed.  I provided 30 minutes of non-face-to-face time during this encounter.       03/19/2023 1:55 PM MCCOY CHAO  MRN:  [id number]  Chief Complaint: "I'm am still stressing myself out"   HPI: 52 year old female seen today for follow up psychiatric evaluation.  She has a psychiatric history of anxiety and depression.  She is currently managed on Zyprexa 15 mg nightly, hydroxyzine 50 mg nightly, gabapentin 300 mg 3 times daily, hydroxyzine 25 mg 3 times daily, Ambien 5 mg nightly, BuSpar 15 mg 3 times daily and Cymbalta 60 mg daily. Today she notes her medications are somewhat effective in managing her psychiatric condition.  Today was well-groomed, pleasant, cooperative, and engaged in conversation.  She informed Probation officer that she continues to stress herself out.  She reports that yesterday she called the police on her sister's boyfriend who was experiencing a mental health crisis.  She informed Probation officer that her sister continues to live with her and notes that yesterday she lost her car.  Patient informed Probation officer that recently she was placed on an insulin pump to control her diabetes.  Patient notes that she is concerned about her health as well as her weight.  She notes that since her last visit she has gained over 10 pounds and is unable to fit some of her clothes.  Provider informed patient that Zyprexa increases appetite therefore weight may increase.  She endorsed understanding and notes that she prefers something that does not cause an  increased appetite.    Due to the above patient informed writer that her anxiety and depression are exacerbated.  Today provider conducted a GAD-7 and patient scored a 21, at her last visit she scored 21.  Provider also conducted PHQ-9 patient scored 23, at her last visit she scored a 25.  She endorses poor sleep noting that she sleeps 3 to 4 hours nightly.  Patient denies visual hallucinations but reports that she continues to have auditory hallucinations of chattering.  Today patient agreeable to increasing BuSpar 15 mg 3 times a day to 30 mg twice daily to help manage anxiety and depression.  She is also agreeable to discontinuing Zyprexa.  She will start Lybalvi 20 mg nightly to help manage symptoms of psychosis and sleep.  She will continue all other medications as prescribed.  No other concerns at this time.     . Visit Diagnosis:    ICD-10-CM   1. Generalized anxiety disorder  F41.1 busPIRone (BUSPAR) 30 MG tablet    DULoxetine (CYMBALTA) 60 MG capsule    DULoxetine (CYMBALTA) 20 MG capsule    gabapentin (NEURONTIN) 300 MG capsule    hydrOXYzine (ATARAX) 25 MG tablet    2. Substance induced mood disorder (HCC)  F19.94 busPIRone (BUSPAR) 30 MG tablet    DULoxetine (CYMBALTA) 20 MG capsule    OLANZapine-Samidorphan (LYBALVI) 20-10 MG TABS    3. Severe recurrent major depressive disorder with psychotic features (HCC)  F33.3 DULoxetine (CYMBALTA) 60 MG capsule    gabapentin (NEURONTIN) 300 MG capsule    4. Primary  insomnia  F51.01 zolpidem (AMBIEN) 5 MG tablet       Past Psychiatric History: anxiety and depression  Past Medical History:  Past Medical History:  Diagnosis Date   Allergy    Anxiety    Arthritis    Asthma    Depression    Diabetes mellitus without complication (HCC)    GERD (gastroesophageal reflux disease)    Glaucoma suspect    Hyperlipidemia    Trichomonas infection     Past Surgical History:  Procedure Laterality Date   CESAREAN SECTION     UPPER  GASTROINTESTINAL ENDOSCOPY     VAGINAL HYSTERECTOMY  12/23/2004   Fibroids, menorrhagia, benign pathology    Family Psychiatric History: Mother schizophrenia and bipolar disorder  Family History:  Family History  Problem Relation Age of Onset   Diabetes Mother    Mental illness Mother    Depression Mother    Hypertension Mother    Colon cancer Father 18   Hypertension Father    Diabetes Father    Colon cancer Paternal Grandmother    Esophageal cancer Neg Hx    Stomach cancer Neg Hx    Rectal cancer Neg Hx     Social History:  Social History   Socioeconomic History   Marital status: Significant Other    Spouse name: Not on file   Number of children: 3   Years of education: 14   Highest education level: Not on file  Occupational History   Not on file  Tobacco Use   Smoking status: Former    Packs/day: 0.25    Years: 8.00    Additional pack years: 0.00    Total pack years: 2.00    Types: Cigarettes    Quit date: 2021    Years since quitting: 3.2   Smokeless tobacco: Never  Vaping Use   Vaping Use: Never used  Substance and Sexual Activity   Alcohol use: Not Currently    Comment: occasional   Drug use: No   Sexual activity: Yes    Birth control/protection: Surgical  Other Topics Concern   Not on file  Social History Narrative   Patient lives at home with mother and father , one story    Patient has 3 children    Patient is single   Patient has 14 years of education    Patient is right handed    Caffeine none   Social Determinants of Health   Financial Resource Strain: Not on file  Food Insecurity: Food Insecurity Present (05/24/2022)   Hunger Vital Sign    Worried About Running Out of Food in the Last Year: Sometimes true    Ran Out of Food in the Last Year: Sometimes true  Transportation Needs: No Transportation Needs (01/30/2021)   PRAPARE - Hydrologist (Medical): No    Lack of Transportation (Non-Medical): No  Physical  Activity: Not on file  Stress: Not on file  Social Connections: Not on file    Allergies:  Allergies  Allergen Reactions   Aspirin Hives   Oxycodone Nausea And Vomiting    Metabolic Disorder Labs: Lab Results  Component Value Date   HGBA1C 10.8 (A) 12/12/2022   MPG 246 07/25/2022   MPG 380.93 10/02/2019   No results found for: "PROLACTIN" Lab Results  Component Value Date   CHOL 177 11/30/2021   TRIG 181 (H) 11/30/2021   HDL 40 11/30/2021   CHOLHDL 4.4 11/30/2021   VLDL 19 11/14/2014  [place] 105 (H) 11/30/2021   LDLCALC 92 02/04/2020   Lab Results  Component Value Date   TSH 2.460 04/30/2021   TSH 0.014 (L) 10/02/2019    Therapeutic Level Labs: No results found for: "LITHIUM" No results found for: "VALPROATE" No results found for: "CBMZ"  Current Medications: Current Outpatient Medications  Medication Sig Dispense Refill   OLANZapine-Samidorphan (LYBALVI) 20-10 MG TABS Take 20 mg by mouth at bedtime. 30 tablet 3   acetaminophen (TYLENOL) 500 MG tablet Take 1 tablet (500 mg total) by mouth every 6 (six) hours as needed. 30 tablet 0   acetaminophen-codeine (TYLENOL #3) 300-30 MG tablet Take 1 tablet by mouth 2 (two) times daily as needed for moderate pain. 30 tablet 0   albuterol (VENTOLIN HFA) 108 (90 Base) MCG/ACT inhaler Inhale 2 puffs into the lungs every 6 (six) hours as needed for wheezing or shortness of breath (Cough). 18 g 0   amLODipine (NORVASC) 2.5 MG tablet Take 1 tablet (2.5 mg total) by mouth daily. 90 tablet 2   atorvastatin (LIPITOR) 40 MG tablet Take 1 tablet (40 mg total) by mouth daily. 90 tablet 2   Blood Glucose Monitoring Suppl (TRUE METRIX METER) w/Device KIT Check blood sugars three times a day 1 kit 0   busPIRone (BUSPAR) 30 MG tablet Take 2 tablets (60 mg total) by mouth 3 (three) times daily. 60 tablet 3   Continuous Blood Gluc Receiver (FREESTYLE LIBRE 2 READER) DEVI Use to check blood sugar three times daily. 1 each 0   Continuous  Blood Gluc Sensor (FREESTYLE LIBRE 3 SENSOR) MISC Place 1 sensor every 14 (fourteen) days. 2 each 11   cycloSPORINE (RESTASIS) 0.05 % ophthalmic emulsion Apply 1 drop into both eyes twice a day (Patient not taking: Reported on 03/15/2023) 180 each 3   DULoxetine (CYMBALTA) 20 MG capsule Take 1 capsule (20 mg total) by mouth daily. 30 capsule 3   DULoxetine (CYMBALTA) 60 MG capsule Take 1 capsule (60 mg total) by mouth daily. 30 capsule 3   Erenumab-aooe (AIMOVIG) 140 MG/ML SOAJ Inject 140 mg into the skin every 28 (twenty-eight) days. 1.12 mL 5   Estradiol 10 MCG TABS vaginal tablet Insert one tab intravaginally 2 times a week 8 tablet 4   fluticasone (FLONASE) 50 MCG/ACT nasal spray Place 1 spray into both nostrils daily. Begin by using 2 sprays in each nare daily for 3 to 5 days, then decrease to 1 spray in each nare daily. 32 mL 1   gabapentin (NEURONTIN) 300 MG capsule Take 1 capsule (300 mg total) by mouth 3 (three) times daily. 90 capsule 3   glucose blood (TRUE METRIX BLOOD GLUCOSE TEST) test strip Use to check blood sugar 3 times daily 100 each 2   hydrOXYzine (ATARAX) 25 MG tablet TAKE 1 TABLET (25 MG TOTAL) BY MOUTH 3 (THREE) TIMES DAILY AS NEEDED. 90 tablet 3   insulin aspart (NOVOLOG FLEXPEN) 100 UNIT/ML FlexPen Inject 30 Units into the skin 3 (three) times daily with meals. (Patient not taking: Reported on 03/15/2023) 15 mL 3   insulin glargine (LANTUS SOLOSTAR) 100 UNIT/ML Solostar Pen Inject 54 Units into the skin 2 (two) times daily. (Patient not taking: Reported on 03/15/2023) 90 mL 1   insulin lispro (HUMALOG) 100 UNIT/ML injection Inject 1 mL (100 Units total) via pump once daily. 30 mL 5   Insulin Pen Needle (PEN NEEDLES) 31G X 6 MM MISC Use as directed 100 each 0   lidocaine (LIDODERM) 5 % Place  1 patch onto the skin daily. Remove & Discard patch within 12 hours or as directed by MD 30 patch 0   metFORMIN (GLUCOPHAGE-XR) 500 MG 24 hr tablet Take 1 tablet by mouth twice daily 180  tablet 0   methocarbamol (ROBAXIN) 500 MG tablet Take 1 tablet (500 mg total) by mouth 3 (three) times daily as needed for muscle spasms. 20 tablet 2   metoprolol tartrate (LOPRESSOR) 25 MG tablet Take 0.5 tablets (12.5 mg total) by mouth 2 (two) times daily. 90 tablet 2   Multiple Vitamins-Minerals (EYE VITAMINS PO) Take 1 tablet by mouth daily.     omeprazole (PRILOSEC) 40 MG capsule TAKE 1 CAPSULE BY MOUTH ONCE DAILY 30 MINUTES BEFORE BREAKFAST 90 capsule 2   ondansetron (ZOFRAN-ODT) 4 MG disintegrating tablet Take 1 tablet (4 mg total) by mouth every 8 (eight) hours as needed for nausea or vomiting. 20 tablet 0   SUMAtriptan (IMITREX) 100 MG tablet TAKE 1 TABLET EARLIEST ONSET OF MIGRAINE. MAY REPEAT IN 2 HOURS IF HEADACHE PERSISTS OR RECURS. MAXIMUM 2 TABLETS IN 24 HOURS. 10 tablet 5   triamcinolone cream (KENALOG) 0.1 % Apply 1 Application topically 2 (two) times daily. 30 g 0   TRUEPLUS LANCETS 28G MISC Check blood sugars three time a day 100 each 12   zolpidem (AMBIEN) 5 MG tablet Take 1 tablet (5 mg total) by mouth at bedtime as needed for sleep. 30 tablet 1   No current facility-administered medications for this visit.     Musculoskeletal: Strength & Muscle Tone: within normal limits, telehealth visit Gait & Station: normal, telehealth visit Patient leans: N/A  Psychiatric Specialty Exam: Review of Systems  There were no vitals taken for this visit.There is no height or weight on file to calculate BMI.  General Appearance: Well Groomed  Eye Contact:  Good  Speech:  Clear and Coherent  Volume:  Normal  Mood:  Anxious and Depressed  Affect:  Appropriate and Congruent  Thought Process:  Coherent, Goal Directed, and Linear  Orientation:  Full (Time, Place, and Person)  Thought Content: Logical and Hallucinations: Auditory,   Suicidal Thoughts:  No  Homicidal Thoughts:  No  Memory:  Immediate;   Good Recent;   Good Remote;   Good  Judgement:  Good  Insight:  Good   Psychomotor Activity:  Normal  Concentration:  Concentration: Good and Attention Span: Good  Recall:  Good  Fund of Knowledge: Good  Language: Good  Akathisia:  No  Handed:  Right  AIMS (if indicated): not done  Assets:  Communication Skills Desire for Improvement Housing Intimacy Physical Health Social Support  ADL's:  Intact  Cognition: WNL  Sleep:  Poor   Screenings: GAD-7    Flowsheet Row Video Visit from 03/19/2023 in [facility] Office Visit from 03/15/2023 in [place] Video Visit from 12/27/2022 in [facility] Office Visit from 12/12/2022 in [place] from 09/24/2022 in [facility]  Total GAD-7 Score 21 21 21 21 19       PHQ2-9    Flowsheet Row Video Visit from 03/19/2023 in [facility] Office Visit from 03/15/2023 in [place] Video Visit from 12/27/2022 in [facility] Office Visit from 12/12/2022 in [place] from 09/24/2022 in [facility]  PHQ-2  Total Score 6 6 6 6 5   PHQ-9 Total Score 23 21 25 21 18       Flowsheet Row ED from 01/17/2023 in [facility] Video Visit from 12/27/2022 in [facility] ED from 10/06/2022 in [facility] Emergency Department at [place] No Risk Low Risk No Risk        Assessment and Plan: Patient endorses symptoms of anxiety, depression, poor sleep,AH and pain.  Today patient agreeable to increasing BuSpar 15 mg 3 times a day to 30 mg twice daily to help manage anxiety and depression.  She is also agreeable to discontinuing Zyprexa.  She will start Lybalvi  20 mg nightly to help manage symptoms of psychosis and  sleep.  She will continue all other medications as prescribed.    1. Generalized anxiety disorder  Increased- busPIRone (BUSPAR) 30 MG tablet; Take 2 tablets (60 mg total) by mouth 3 (three) times daily.  Dispense: 60 tablet; Refill: 3 Continue- DULoxetine (CYMBALTA) 60 MG capsule; Take 1 capsule (60 mg total) by mouth daily.  Dispense: 30 capsule; Refill: 3 Continue- gabapentin (NEURONTIN) 300 MG capsule; Take 1 capsule (300 mg total) by mouth 3 (three) times daily.  Dispense: 90 capsule; Refill: 3 Continue- hydrOXYzine (ATARAX) 25 MG tablet; TAKE 1 TABLET (25 MG TOTAL) BY MOUTH 3 (THREE) TIMES DAILY AS NEEDED.  Dispense: 90 tablet; Refill: 3  2. Substance induced mood disorder (HCC)  increased- busPIRone (BUSPAR) 30 MG tablet; Take 2 tablets (60 mg total) by mouth 3 (three) times daily.  Dispense: 60 tablet; Refill: 3 Continue- DULoxetine (CYMBALTA) 20 MG capsule; Take 1 capsule (20 mg total) by mouth daily.  Dispense: 30 capsule; Refill: 3 increased- OLANZapine-Samidorphan (LYBALVI) 20-10 MG TABS; Take 20 mg by mouth at bedtime.  Dispense: 30 tablet; Refill: 3  3. Severe recurrent major depressive disorder with psychotic features ([place])  Continue- DULoxetine (CYMBALTA) 60 MG capsule; Take 1 capsule (60 mg total) by mouth daily.  Dispense: 30 capsule; Refill: 3 Continue- gabapentin (NEURONTIN) 300 MG capsule; Take 1 capsule (300 mg total) by mouth 3 (three) times daily.  Dispense: 90 capsule; Refill: 3  4. Primary insomnia  Continue- zolpidem (AMBIEN) 5 MG tablet; Take 1 tablet (5 mg total) by mouth at bedtime as needed for sleep.  Dispense: 30 tablet; Refill: 1   Follow-up in 3 months Follow-up with therapy   Salley Slaughter, NP 03/19/2023, 1:55 PM

## 2023-03-20 ENCOUNTER — Ambulatory Visit: Payer: Medicaid Other

## 2023-03-20 DIAGNOSIS — M79671 Pain in right foot: Secondary | ICD-10-CM

## 2023-03-20 DIAGNOSIS — M79672 Pain in left foot: Secondary | ICD-10-CM

## 2023-03-20 DIAGNOSIS — R2689 Other abnormalities of gait and mobility: Secondary | ICD-10-CM

## 2023-03-20 DIAGNOSIS — M6281 Muscle weakness (generalized): Secondary | ICD-10-CM

## 2023-03-20 NOTE — Therapy (Addendum)
OUTPATIENT PHYSICAL THERAPY TREATMENT NOTE/Re-Cert  PHYSICAL THERAPY DISCHARGE SUMMARY  Visits from Start of Care: 15  Current functional level related to goals / functional outcomes: See goals and objective   Remaining deficits: See goals and objective   Education / Equipment: HEP   Patient agrees to discharge. Patient goals were partially met. Patient is being discharged due to maximized rehab potential.    Patient Name: Whitney Benton MRN: [id number] DOB:07-Feb-1971, 52 y.o., female Today's Date: 03/20/2023  PCP: Ladell Pier, MD  REFERRING PROVIDER: Yevonne Pax, DPM   END OF SESSION:   PT End of Session - 03/20/23 1658     Visit Number 15    Number of Visits 17    Date for PT Re-Evaluation 03/06/23    Authorization Type [employer]    PT Start Time 1655    PT Stop Time [id number]    PT Time Calculation (min) 30 min    Activity Tolerance Patient tolerated treatment well    Behavior During Therapy WFL for tasks assessed/performed                Past Medical History:  Diagnosis Date   Allergy    Anxiety    Arthritis    Asthma    Depression    Diabetes mellitus without complication ([place])    GERD (gastroesophageal reflux disease)    Glaucoma suspect    Hyperlipidemia    Trichomonas infection    Past Surgical History:  Procedure Laterality Date   CESAREAN SECTION     UPPER GASTROINTESTINAL ENDOSCOPY     VAGINAL HYSTERECTOMY  12/23/2004   Fibroids, menorrhagia, benign pathology   Patient Active Problem List   Diagnosis Date Noted   Hyperlipidemia associated with type 2 diabetes mellitus ([place]) 03/06/2023   Severe recurrent major depressive disorder with psychotic features ([place]) 12/27/2022   Primary insomnia 12/27/2022   Morbid obesity ([place]) 04/04/2022   Former smoker 04/30/2021   Major depressive disorder, recurrent episode, moderate ([place]) 04/30/2021   Substance induced mood disorder ([place]) 03/22/2021   Generalized anxiety disorder 03/22/2021    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   DKA (diabetic ketoacidosis) ([place]) 01/23/2021   Glaucoma suspect 04/12/2020   DKA, type 2, not at goal [facility]) 10/02/2019   AKI (acute kidney injury) ([place]) 10/02/2019   Hyperkalemia 10/02/2019   Leukocytosis 10/02/2019   Abnormal LFTs 10/02/2019   Stressful life events affecting family and household 08/06/2019   New onset type 2 diabetes mellitus ([place]) 02/04/2019   Obesity (BMI 35.0-39.9 without comorbidity) 02/04/2019   Tobacco dependence 02/04/2019   Left arm weakness 12/06/2014   Paresthesias/numbness 12/06/2014   Tobacco abuse 11/14/2014   Depression    Mixed incontinence 06/27/2014   History of TVH in 2006 for fibroids and menorrhagia; benign pathology 09/08/2011    REFERRING DIAG: M72.2 (ICD-10-CM) - Plantar fasciitis, bilateral    THERAPY DIAG:  Pain in right foot  Pain in left foot  Other abnormalities of gait and mobility  Muscle weakness (generalized)  Rationale for Evaluation and Treatment Rehabilitation  PERTINENT HISTORY: Anxiety, Depression, DMII   PRECAUTIONS: None  SUBJECTIVE:  SUBJECTIVE STATEMENT:  Pt presents to PT with continued pain in bilateral LE. Has been compliant with HEP.   PAIN:  Are you having pain?  Yes: NPRS scale: 7-8/10 Worst: 10/10 Pain location: bilateral heels Pain description: sharp Aggravating factors: stairs, first step out of bed Relieving factors: none   OBJECTIVE: (objective measures completed at initial evaluation unless otherwise dated)   DIAGNOSTIC FINDINGS:             See imaging    PATIENT SURVEYS:  FOTO: 38% function; 52% predicted - 02/06/23   COGNITION: Overall cognitive status: Within functional limits for tasks assessed                         SENSATION: Not tested    POSTURE: rounded shoulders, forward head, and larger body habitus   PALPATION: TTP to bilateral heels, bilateral gastroc   LOWER EXTREMITY ROM:   Active ROM Right eval Left eval Right 01/16/23 Left 01/16/23 Right 02/06/23 Left 02/06/23 Right  03/20/23 Left  03/20/23  Hip flexion            Hip extension            Hip abduction            Hip adduction            Hip internal rotation            Hip external rotation            Knee flexion            Knee extension            Ankle dorsiflexion -5 -10 -1 -8 0 -8 0 -3  Ankle plantarflexion            Ankle inversion            Ankle eversion             (Blank rows = not tested)   LOWER EXTREMITY MMT:   MMT Right eval Left eval  Hip flexion      Hip extension      Hip abduction      Hip adduction      Hip internal rotation      Hip external rotation      Knee flexion      Knee extension      Ankle dorsiflexion      Ankle plantarflexion 4/5 3+/5 p!  Ankle inversion      Ankle eversion       (Blank rows = not tested)   LOWER EXTREMITY SPECIAL TESTS:  DNT   FUNCTIONAL TESTS:  Five Time Sit to Stand: 16 seconds - 02/06/23 SLS: 5" R  5" L   GAIT: Distance walked: 73ft Assistive device utilized: None Level of assistance: Complete Independence Comments: antalgica gait L, decreased heel strike   TREATMENT: [facility] Adult PT Treatment:                                                DATE: 03/20/23 Therapeutic Exercise: Nustep level 6 x 5 min  Slant board gastroc stretch x 60" Therapeutic Activity: Re-administration of FOTO, goals, updated HEP and reviewed    [facility] Adult PT Treatment:  DATE: 02/27/23 Therapeutic Exercise: Nustep level 6 x 5 min  Slant board gastroc stretch x 60" Slant board soleus stretch x 60" Heel raise with ball 2x15 Ankle inv/ev 2x10 RTB Manual Therapy: STM/MTPR BIL gastroc  [facility] Adult PT Treatment:                                                 DATE: 02/18/23 Therapeutic Exercise: Nustep level 6 x 6 mins Slant board gastroc stretch x 60" Slant board soleus stretch x 60" Heel raises on wall 2x10 Toe raises on wall 2x10 Tandem on foam 2x30" each Mini squat on foam 2x10 Tandem walking on Airex with single UE support x3 laps BAPS L3 x 10 cw/ccw each Manual Therapy: STM/MTPR BIL gastroc  [facility] Adult PT Treatment:                                                DATE: 02/06/2023 Therapeutic Activity: Assessment of tests/measures, goals, and outcomes for recert Manual Therapy: STM/MTPR BIL gastroc    PATIENT EDUCATION:  Education details: eval findings, FOTO, HEP, POC Person educated: Patient Education method: Explanation, Demonstration, and Handouts Education comprehension: verbalized understanding and returned demonstration   HOME EXERCISE PROGRAM: Access Code: [facility] [place] URL: [email or web address] Date: 03/20/2023 Prepared by: Margarette Canada  Exercises - Long Sitting Calf Stretch with Strap  - 2 x daily - 7 x weekly - 2-3 reps - 30 sec hold - Towel Scrunches  - 2 x daily - 7 x weekly - 2 reps - 60 sec hold - Seated Plantar Fascia Mobilization with Small Ball  - 2 x daily - 7 x weekly - 3 sets - 10 reps - Standing Calf Raise With Small Ball at Heels  - 2 x daily - 7 x weekly - 3 sets - 10 reps - Tandem Stance  - 2 x daily - 7 x weekly - 3 sets - 30 sec hold   ASSESSMENT:   CLINICAL IMPRESSION: Patient presents to PT reporting continued BIL heel pain, reports HEP compliance. She met her LTG for STS, achieving 12.5 seconds today, showing improved function. She has not met her goals surrounding pain, balance in SLS, and DF ROM, though she did make improvements through course of PT Advised patient to follow up with MD regarding continued pain. Patient is appropriate for DC from PT at this time.    OBJECTIVE IMPAIRMENTS: Abnormal gait, decreased activity tolerance, decreased mobility, difficulty  walking, decreased ROM, decreased strength, and pain.    ACTIVITY LIMITATIONS: standing, squatting, stairs, transfers, and locomotion level   PARTICIPATION LIMITATIONS: meal prep, cleaning, laundry, driving, community activity, and yard work   PERSONAL FACTORS: Fitness and 3+ comorbidities: Anxiety, Depression, DMII  are also affecting patient's functional outcome.      GOALS: Goals reviewed with patient? No   SHORT TERM GOALS: Target date: 01/01/2023   Pt will be compliant and knowledgeable with initial HEP for improved comfort and carryover Baseline: initial HEP given  Goal status: MET   2.  Pt will self report bilateral heel pain no greater than 6/10 for improved comfort and functional ability Baseline: 10/10 at worst Goal status: NOT MET Pt reports 10/10 at worst 01/07/23 Pt reports 8/10 03/20/23  LONG TERM GOALS: Target date: 03/06/2023   Pt will self report bilateral heel pain no greater than 3/10 for improved comfort and functional ability Baseline: 10/10 at worst Goal status: NOT MET Pt reports 8/10 03/20/23   2.  Pt will improve FOTO function score to no less than 52% as proxy for functional improvement Baseline: 28% function 02/06/2023: 38% function 03/20/23: 34% Goal status: PARTIALLY MET   3.  Pt will improve Five Time Sit to Stand to no greater than 15 seconds for improved balance and functional mobility Baseline: 22 seconds with pain 02/06/2023: 16 seconds 03/20/23: 12.5 seconds Goal status: MET   4.  Pt will improve bilateral ankle DF to no less than 5 for improved gait and decreased pain  Baseline: see chart Goal status: PARTIALLY MET   5.  Pt will improve bilateral SLS time to no less than 15 seconds for improved balance and ankle stability Baseline: 4" R  2" L - (L painful) Goal status: PARTIALLY MET 03/20/23: L 5 seconds, Rt 8 seconds     PLAN:   PT FREQUENCY: 2x/week   PT DURATION: 8 weeks   PLANNED INTERVENTIONS: Therapeutic exercises,  Therapeutic activity, Neuromuscular re-education, Balance training, Gait training, Patient/Family education, Self Care, Joint mobilization, Aquatic Therapy, Dry Needling, Electrical stimulation, Cryotherapy, Moist heat, Manual therapy, and Re-evaluation   PLAN FOR NEXT SESSION: DC   Margarette Canada, PTA 03/20/2023, 5:28 PM

## 2023-03-25 ENCOUNTER — Ambulatory Visit (INDEPENDENT_AMBULATORY_CARE_PROVIDER_SITE_OTHER): Payer: Medicaid Other | Admitting: Clinical

## 2023-03-25 DIAGNOSIS — F411 Generalized anxiety disorder: Secondary | ICD-10-CM | POA: Diagnosis not present

## 2023-03-25 NOTE — Progress Notes (Signed)
   THERAPIST PROGRESS NOTE  Session Time: 30 minutes  Participation Level: Active  Behavioral Response: CasualAlertIrritable  Type of Therapy: Individual Therapy  Treatment Goals addressed: client will complete 80% of assigned homework  ProgressTowards Goals: Progressing  Interventions: CBT and Supportive  Summary:  Whitney Benton is a 52 y.o. female who presents for the scheduled appointment oriented times five, appropriately dressed and friendly. Client denied hallucinations and delusions. Client reported on today she had a bad start to her day. Client reported she and her son got into a argument about her schedule and his preferences for when she is watching his son. Client reported she feels bare without having her grandson. Client reported he is her youngest son and he can be mouthy. Client reported her boyfriend and other son have noticed that she does not go off on him because she wants to be able to keep seeing her grandson. Client reported she is tired of him talking to her like she is not his mother. Client reported her boyfriend told her she does not need to put up with his attitude. Client reported otherwise her sisters car was repo'ed so she is having to share her car with her. Client reported someone also reported to the city about her lawn and falsely stated she had rodents coming from her home. Client reported she is working on finding someone to do the yard work. Client reported on April 10th she is going in to have surgery to have her gallbladder removed. Client reported she has been upset and stressed about everything and wants to let it go. Evidence of progress towards goal:      Suicidal/Homicidal: Nowithout intent/plan  Therapist Response:  Therapist began the appointment asking the client how she is doing since last seen. Therapist used CBT to engage using active listening and positive emotional support. Therapist used CBT to engage give the client time to discuss  stressors related to her negative emotions. Therapist used CBT to engage and normalize her emotions. Therapist used CBT to engage and help the client reframe her way of thinking that enables some negative dynamics and help identify boundaries that need to be in place. Therapist used CBT ask the client to identify her progress with frequency of use with coping skills with continued practice in her daily activity.    Therapist assigned the client homework to practice boundaries/ assertive communication and self care.   Plan: Return again in 3 weeks.  Diagnosis: generalized anxiety disorder  Collaboration of Care: Patient refused AEB none requested by the client.  Patient/Guardian was advised Release of Information must be obtained prior to any record release in order to collaborate their care with an outside provider. Patient/Guardian was advised if they have not already done so to contact the registration department to sign all necessary forms in order for [place] to release information regarding their care.   Consent: Patient/Guardian gives verbal consent for treatment and assignment of benefits for services provided during this visit. Patient/Guardian expressed understanding and agreed to proceed.   [place], LCSW 03/25/2023

## 2023-03-28 ENCOUNTER — Other Ambulatory Visit: Payer: Self-pay | Admitting: *Deleted

## 2023-03-28 DIAGNOSIS — E785 Hyperlipidemia, unspecified: Secondary | ICD-10-CM | POA: Diagnosis not present

## 2023-03-28 DIAGNOSIS — I10 Essential (primary) hypertension: Secondary | ICD-10-CM | POA: Diagnosis not present

## 2023-03-28 DIAGNOSIS — E139 Other specified diabetes mellitus without complications: Secondary | ICD-10-CM | POA: Diagnosis not present

## 2023-04-02 DIAGNOSIS — E119 Type 2 diabetes mellitus without complications: Secondary | ICD-10-CM | POA: Diagnosis not present

## 2023-04-02 DIAGNOSIS — I10 Essential (primary) hypertension: Secondary | ICD-10-CM | POA: Diagnosis not present

## 2023-04-02 DIAGNOSIS — K8020 Calculus of gallbladder without cholecystitis without obstruction: Secondary | ICD-10-CM | POA: Diagnosis not present

## 2023-04-02 DIAGNOSIS — J45909 Unspecified asthma, uncomplicated: Secondary | ICD-10-CM | POA: Diagnosis not present

## 2023-04-02 DIAGNOSIS — K760 Fatty (change of) liver, not elsewhere classified: Secondary | ICD-10-CM | POA: Diagnosis not present

## 2023-04-04 ENCOUNTER — Telehealth: Payer: Self-pay

## 2023-04-04 NOTE — Telephone Encounter (Signed)
PA request received via CMM for Aimovig 140MG /ML auto-injectors  PA has been submitted to [employer] and is pending determination  Key: [id number]

## 2023-04-06 ENCOUNTER — Other Ambulatory Visit: Payer: Self-pay | Admitting: Internal Medicine

## 2023-04-06 DIAGNOSIS — E1165 Type 2 diabetes mellitus with hyperglycemia: Secondary | ICD-10-CM

## 2023-04-07 ENCOUNTER — Other Ambulatory Visit: Payer: Self-pay

## 2023-04-07 NOTE — Telephone Encounter (Signed)
PA has been DENIED. Denial letter has been attached in patients documents.  

## 2023-04-07 NOTE — Telephone Encounter (Signed)
Per Letter patient has try a antidepressant( Amtriptlyne,Venlafaxine) Hasn't tried neither Beta blocker ( Metoprolol( Tried) , propranolol,Timolol,atenolol)  Anit-epileptic: Valproate,Topiramate( Tried).   Please patient has [employer]( Medicaid)

## 2023-04-08 ENCOUNTER — Ambulatory Visit: Payer: Medicaid Other | Admitting: Pharmacist

## 2023-04-10 ENCOUNTER — Ambulatory Visit: Payer: Medicaid Other | Admitting: Podiatry

## 2023-04-10 DIAGNOSIS — M722 Plantar fascial fibromatosis: Secondary | ICD-10-CM

## 2023-04-10 DIAGNOSIS — M7750 Other enthesopathy of unspecified foot: Secondary | ICD-10-CM

## 2023-04-10 NOTE — Progress Notes (Signed)
  Subjective:  Patient ID: Whitney Benton, female    DOB: 1971-12-20,  MRN: [id number]  Chief Complaint  Patient presents with   Follow-up    Patient states that her foot feel worse. She states the injections made her foot feel worse.    52 y.o. female presents for follow up on bilateral PF.  Patient states she is having still pain in the bilateral heel.  Left foot is worse than right.  She has been treated for planter fasciitis for several months now.  Not having any improvement.  She says that the prior steroid injections that were given into both heels made it worse and did not help.  She states she does not like wearing the inserts or using the night splint.  Review of Systems: Negative except as noted in the HPI. Denies N/V/F/Ch.   Objective:  There were no vitals filed for this visit. There is no height or weight on file to calculate BMI. Constitutional Well developed. Well nourished.  Vascular Dorsalis pedis pulses palpable bilaterally. Posterior tibial pulses palpable bilaterally. Capillary refill normal to all digits.  No cyanosis or clubbing noted. Pedal hair growth normal.  Neurologic Normal speech. Oriented to person, place, and time. Epicritic sensation to light touch grossly present bilaterally.  Dermatologic Nails well groomed and normal in appearance. No open wounds. No skin lesions.  Orthopedic: Normal joint ROM without pain or crepitus bilaterally. No visible deformities. Tender to palpation at the calcaneal tuber bilaterally. No pain with calcaneal squeeze bilaterally. Ankle ROM diminished range of motion bilaterally. Silfverskiold Test: deferred bilaterally.   Radiographs: Taken and reviewed. No acute fractures or dislocations. No evidence of stress fracture.  Plantar heel spur absent. Posterior heel spur absent.   Assessment:   1. Plantar fasciitis, bilateral   2. Tendonitis of ankle or foot     Plan:  Patient was evaluated and treated and all  questions answered.  Plantar Fasciitis, bilaterally - XR reviewed as above.  - Educated on icing and stretching. Instructions given.  - Injection deferred per patient - DME: Continue power step orthotics, continue stressed the importance of wearing the inserts as well as good supportive shoes to prevent stress on the plantar fascia when weightbearing. - Pharmacologic management: Meloxicam 15 mg takes once daily as needed for pain inflammation -Discussed with patient that if she is not having improvement after several months of conservative therapy at he should proceed with an MRI of the left foot to evaluate the nature of the plantar fashion the surrounding musculature and tendons. -Discussed that pending the results of the MRI we will have to discuss's and consider surgical intervention for plantar fasciotomy and possible heel spur resection.   Return in about 4 weeks (around 05/08/2023) for Discussed MRI results left foot plantar fasciitis.

## 2023-04-11 ENCOUNTER — Other Ambulatory Visit (HOSPITAL_COMMUNITY): Payer: Self-pay

## 2023-04-14 ENCOUNTER — Telehealth: Payer: Self-pay | Admitting: Anesthesiology

## 2023-04-14 NOTE — Telephone Encounter (Signed)
Patient advised of Encounter 04/04/23.

## 2023-04-14 NOTE — Telephone Encounter (Signed)
Pt left message stating has not been able to fill her Rx for Aimovig. States it needs a PA.

## 2023-04-15 ENCOUNTER — Other Ambulatory Visit: Payer: Self-pay | Admitting: Internal Medicine

## 2023-04-15 ENCOUNTER — Ambulatory Visit (INDEPENDENT_AMBULATORY_CARE_PROVIDER_SITE_OTHER): Payer: Medicaid Other | Admitting: Clinical

## 2023-04-15 ENCOUNTER — Other Ambulatory Visit: Payer: Self-pay

## 2023-04-15 DIAGNOSIS — F331 Major depressive disorder, recurrent, moderate: Secondary | ICD-10-CM | POA: Diagnosis not present

## 2023-04-15 DIAGNOSIS — E1165 Type 2 diabetes mellitus with hyperglycemia: Secondary | ICD-10-CM

## 2023-04-15 NOTE — Telephone Encounter (Signed)
Requested medication (s) are due for refill today - yes  Requested medication (s) are on the active medication list -yes  Future visit scheduled -yes  Last refill: 02/13/23 15ml 3 RF  Notes to clinic: Call to patient- patient reports she is using the injectable insulin when she does not wear her machine. Patient is requesting RF and pen needles too.  Requested Prescriptions  Pending Prescriptions Disp Refills   insulin aspart (NOVOLOG FLEXPEN) 100 UNIT/ML FlexPen 15 mL 3    Sig: Inject 30 Units into the skin 3 (three) times daily with meals.     Endocrinology:  Diabetes - Insulins Failed - 04/15/2023  8:56 AM      Failed - HBA1C is between 0 and 7.9 and within 180 days    HbA1c, POC (controlled diabetic range)  Date Value Ref Range Status  12/12/2022 10.8 (A) 0.0 - 7.0 % Final         Passed - Valid encounter within last 6 months    Recent Outpatient Visits           1 month ago Onycholysis   [place] [facility] [place], Jomarie Longs, FNP   1 month ago Type 2 diabetes mellitus with morbid obesity [facility])   [place] [facility] Jonah Blue B, MD   2 months ago Type 2 diabetes mellitus with hyperglycemia, with long-term current use of insulin [facility])   [place] [facility] [place], Bradley L, RPH-CPP   3 months ago Type 2 diabetes mellitus with hyperglycemia, with long-term current use of insulin [facility])   [place] [facility] [place], [place] L, RPH-CPP   4 months ago Type 2 diabetes mellitus with hyperglycemia, with long-term current use of insulin [facility])   [place] [facility] Marcine Matar, MD       Future Appointments             In 2 months Laural Benes Binnie Rail, MD [facility]   In 4 months Meriam Sprague, MD [facility]               Requested  Prescriptions  Pending Prescriptions Disp Refills   insulin aspart (NOVOLOG FLEXPEN) 100 UNIT/ML FlexPen 15 mL 3    Sig: Inject 30 Units into the skin 3 (three) times daily with meals.     Endocrinology:  Diabetes - Insulins Failed - 04/15/2023  8:56 AM      Failed - HBA1C is between 0 and 7.9 and within 180 days    HbA1c, POC (controlled diabetic range)  Date Value Ref Range Status  12/12/2022 10.8 (A) 0.0 - 7.0 % Final         Passed - Valid encounter within last 6 months    Recent Outpatient Visits           1 month ago Onycholysis   [place] [facility] [place], Jomarie Longs, FNP   1 month ago Type 2 diabetes mellitus with morbid obesity [facility])   [place] [facility] Jonah Blue B, MD   2 months ago Type 2 diabetes mellitus with hyperglycemia, with long-term current use of insulin [facility])    [facility] [place], [place] L, RPH-CPP   3 months ago Type 2 diabetes mellitus with hyperglycemia, with long-term current use of  insulin [facility])   [place] [facility] [place], [place]ann[place] Senior L, RPH-CPP   4 months ago Type 2 diabetes mellitus with hyperglycemia, with long-term current use of insulin [facility])   [place] [facility] Marcine Matar, MD       Future Appointments             In 2 months Laural Benes, Binnie Rail, MD [facility]   In 4 months Shari Prows, Kathlynn Grate, MD [facility]

## 2023-04-15 NOTE — Progress Notes (Unsigned)
   THERAPIST PROGRESS NOTE  Session Time: 30 miinutes  Participation Level: Active  Behavioral Response: CasualAlertIrritable  Type of Therapy: Individual Therapy  Treatment Goals addressed: client will complete 80% of assigned homework  ProgressTowards Goals: Not Progressing  Interventions: CBT and Supportive  Summary:  Whitney Benton is a 52 y.o. female who presents with a scheduled appointment oriented x 5, appropriately dressed, fairly.  Client denied hallucinations and delusions. Client reported on today she has been experiencing stress.  Client reported 2 weeks ago she had surgery for her gallbladder which went well.  Client reported she had been experiencing some soreness and nausea afterwards but is feeling better.  Client reported she did the recovery at her son's house.  Client reported she was frustrated because upon returning home she saw that her sister and her boyfriend left the house in disarray when she came back. Client reported she has been worried about being able to pay her bills. Client reported the mortgage and utilities she worries about but ask her sister to help with payments. Client reported she is going to go to social services to see if they can help her. Client reported next month is her parents anniversary and mothers day. Client reported she feels looming depression about those days. Client reported as time has gone on she feels better coping with their passing's but she still has her days. Evidence of progress towards goal:  client reported 1 positive of improved ability to cope with her symptoms of grief.  Flowsheet Row Counselor from 04/15/2023 in [facility]  PHQ-9 Total Score 22        Suicidal/Homicidal: Nowithout intent/plan  Therapist Response:  Therapist began the appointment asking the client how she has been doing since last seen. Therapist used CBT to engage using active listening and positive emotional  support. Therapist used CBT to engage and ask the client how she is recovering from surgery and other psychosocial stressors. Therapist used CBT to engage and normalize the client emotions and positively reinforce her brainstorm ideas to help stressors. Therapist used CBT to discuss grief and encourage her to use upcoming date of significance as a positive reflection of how she is progressing with life. Therapist used CBT ask the client to identify her progress with frequency of use with coping skills with continued practice in her daily activity.    Therapist assigned the client homework to practice self care.    Plan: Return again in 3 weeks.  Diagnosis: mdd, recurrent episode, moderate  Collaboration of Care: Patient refused AEB none requested by the client.  Patient/Guardian was advised Release of Information must be obtained prior to any record release in order to collaborate their care with an outside provider. Patient/Guardian was advised if they have not already done so to contact the registration department to sign all necessary forms in order for [place] to release information regarding their care.   Consent: Patient/Guardian gives verbal consent for treatment and assignment of benefits for services provided during this visit. Patient/Guardian expressed understanding and agreed to proceed.   Neena Rhymes Adylee Leonardo, LCSW 04/15/2023

## 2023-04-16 ENCOUNTER — Other Ambulatory Visit: Payer: Self-pay | Admitting: Pharmacist

## 2023-04-16 ENCOUNTER — Other Ambulatory Visit: Payer: Self-pay

## 2023-04-16 DIAGNOSIS — E119 Type 2 diabetes mellitus without complications: Secondary | ICD-10-CM

## 2023-04-16 MED ORDER — INSULIN PEN NEEDLE 31G X 6 MM MISC
0 refills | Status: DC
Start: 2023-04-16 — End: 2023-04-30
  Filled 2023-04-16: qty 100, 20d supply, fill #0

## 2023-04-17 ENCOUNTER — Other Ambulatory Visit: Payer: Self-pay

## 2023-04-18 ENCOUNTER — Other Ambulatory Visit (HOSPITAL_BASED_OUTPATIENT_CLINIC_OR_DEPARTMENT_OTHER): Payer: Self-pay

## 2023-04-18 ENCOUNTER — Emergency Department (HOSPITAL_BASED_OUTPATIENT_CLINIC_OR_DEPARTMENT_OTHER): Payer: Medicaid Other

## 2023-04-18 ENCOUNTER — Ambulatory Visit: Payer: Self-pay

## 2023-04-18 ENCOUNTER — Emergency Department (HOSPITAL_BASED_OUTPATIENT_CLINIC_OR_DEPARTMENT_OTHER)
Admission: EM | Admit: 2023-04-18 | Discharge: 2023-04-18 | Disposition: A | Discharge status: Home / Self Care | Payer: Medicaid Other | Attending: Emergency Medicine | Admitting: Emergency Medicine

## 2023-04-18 ENCOUNTER — Encounter (HOSPITAL_BASED_OUTPATIENT_CLINIC_OR_DEPARTMENT_OTHER): Payer: Self-pay

## 2023-04-18 ENCOUNTER — Other Ambulatory Visit: Payer: Self-pay

## 2023-04-18 DIAGNOSIS — M79605 Pain in left leg: Secondary | ICD-10-CM | POA: Insufficient documentation

## 2023-04-18 DIAGNOSIS — M79604 Pain in right leg: Secondary | ICD-10-CM | POA: Insufficient documentation

## 2023-04-18 NOTE — Telephone Encounter (Signed)
  Chief Complaint: Left leg is swollen from calf down to foot.   Had gallbladder surgery on April 10th.   Swelling started 04/12/2023.    Symptoms: Left leg more swollen but right has mild swelling   Unable to tell me if there was a temperature difference.   Denies pain in calf just that it's a lot more swollen. Frequency: Started 04/12/2023.   Post op for gallbladder surgery 04/02/2023 Pertinent Negatives: Patient denies shortness of breath or chest pain Disposition: [x] ED /[] Urgent Care (no appt availability in office) / [] Appointment(In office/virtual)/ []  [place] [facility]/ [] Home Care/ [] Refused Recommended Disposition /[] [place] [facility]/ []  Follow-up with PCP Additional Notes: No appts available at [facility].   Referred her to the ED to r/o a blood clot especially since it's Friday and going into the weekend and she is post op.   Unable to tell me if she had ever had a blood clot in her leg before when I asked her.  Agreeable to going to ED but unable to tell me which ED.   There was a lot of background talking going on.   Sounded like she was in a doctor's office due to the conversations going on around her.

## 2023-04-18 NOTE — Discharge Instructions (Signed)
You are seen in the ER for leg discomfort.  DVT study is negative.  No signs of infection.  We recommend that you follow-up with your primary care doctor in about 2 weeks.

## 2023-04-18 NOTE — ED Provider Notes (Signed)
[place] EMERGENCY DEPARTMENT AT [facility] Provider Note   CSN: [id number] Arrival date & time: 04/18/23  1059     History  Chief Complaint  Patient presents with   Leg Pain    left    Whitney Benton is a 52 y.o. female.  HPI    52 year old female comes in with chief complaint of bilateral leg swelling, left worse than right and bilateral calf pain.  Patient states that she had cholecystectomy on 4-10.  But 4 to 5 days ago she started noticing some swelling and discomfort over both of her legs, but left worse than right.  She has no known cardiac disease history.  She denies any chest pain, shortness of breath.  She was advised to come to the ER for DVT evaluation.  Patient's past medical history is positive for diabetes.  Also at some point she was taking oral contraceptives.  Home Medications Prior to Admission medications   Medication Sig Start Date End Date Taking? Authorizing Provider  polyethylene glycol (MIRALAX / GLYCOLAX) 17 g packet Take 17 g by mouth daily. 04/03/23  Yes [provider]  acetaminophen (TYLENOL) 500 MG tablet Take 1 tablet (500 mg total) by mouth every 6 (six) hours as needed. 10/06/22   Redwine, Madison A, PA-C  acetaminophen-codeine (TYLENOL #3) 300-30 MG tablet Take 1 tablet by mouth 2 (two) times daily as needed for moderate pain. 12/10/22   Cristie Hem, PA-C  albuterol (VENTOLIN HFA) 108 (90 Base) MCG/ACT inhaler Inhale 2 puffs into the lungs every 6 (six) hours as needed for wheezing or shortness of breath (Cough). 02/26/23   Marcine Matar, MD  amLODipine (NORVASC) 2.5 MG tablet Take 1 tablet (2.5 mg total) by mouth daily. 02/13/23   Meriam Sprague, MD  atorvastatin (LIPITOR) 40 MG tablet Take 1 tablet (40 mg total) by mouth daily. 01/06/23   Meriam Sprague, MD  Blood Glucose Monitoring Suppl (TRUE METRIX METER) w/Device KIT Check blood sugars three times a day 08/09/21   Marcine Matar, MD  busPIRone (BUSPAR)  30 MG tablet Take 2 tablets (60 mg total) by mouth 3 (three) times daily. 03/19/23   Shanna Cisco, NP  Continuous Blood Gluc Receiver (FREESTYLE LIBRE 2 READER) DEVI Use to check blood sugar three times daily. 07/23/22   Marcine Matar, MD  Continuous Blood Gluc Sensor (FREESTYLE LIBRE 3 SENSOR) MISC Place 1 sensor every 14 (fourteen) days. 02/12/23     cycloSPORINE (RESTASIS) 0.05 % ophthalmic emulsion Apply 1 drop into both eyes twice a day Patient not taking: Reported on 03/15/2023 09/19/21     DULoxetine (CYMBALTA) 60 MG capsule Take 1 capsule (60 mg total) by mouth daily. 03/19/23   Shanna Cisco, NP  Erenumab-aooe (AIMOVIG) 140 MG/ML SOAJ Inject 140 mg into the skin every 28 (twenty-eight) days. 03/11/23   Drema Dallas, DO  Estradiol 10 MCG TABS vaginal tablet Insert one tab intravaginally 2 times a week 11/24/22   Marcine Matar, MD  fluticasone [facility]) 50 MCG/ACT nasal spray Place 1 spray into both nostrils daily. Begin by using 2 sprays in each nare daily for 3 to 5 days, then decrease to 1 spray in each nare daily. 04/29/22   Theadora Rama Scales, PA-C  gabapentin (NEURONTIN) 300 MG capsule Take 1 capsule (300 mg total) by mouth 3 (three) times daily. 03/19/23   Toy Cookey E, NP  glucose blood (TRUE METRIX BLOOD GLUCOSE TEST) test strip Use to check  blood sugar 3 times daily 07/06/21   Marcine Matar, MD  hydrOXYzine (ATARAX) 25 MG tablet TAKE 1 TABLET (25 MG TOTAL) BY MOUTH 3 (THREE) TIMES DAILY AS NEEDED. 03/19/23   Toy Cookey E, NP  insulin aspart (NOVOLOG FLEXPEN) 100 UNIT/ML FlexPen Inject 30 Units into the skin 3 (three) times daily with meals. Patient not taking: Reported on 03/15/2023 02/13/23   Marcine Matar, MD  insulin glargine (LANTUS SOLOSTAR) 100 UNIT/ML Solostar Pen Inject 54 Units into the skin 2 (two) times daily. Patient not taking: Reported on 03/15/2023 03/06/23   Marcine Matar, MD  insulin lispro (HUMALOG) 100 UNIT/ML injection Inject  1 mL (100 Units total) via pump once daily. 03/05/23     Insulin Pen Needle 31G X 6 MM MISC Use as directed. 04/16/23   Marcine Matar, MD  lidocaine (LIDODERM) 5 % Place 1 patch onto the skin daily. Remove & Discard patch within 12 hours or as directed by MD 10/06/22   Redwine, Madison A, PA-C  metFORMIN (GLUCOPHAGE-XR) 500 MG 24 hr tablet Take 1 tablet by mouth twice daily 01/15/23   Marcine Matar, MD  methocarbamol (ROBAXIN) 500 MG tablet Take 1 tablet (500 mg total) by mouth 3 (three) times daily as needed for muscle spasms. 09/04/22   Cristie Hem, PA-C  metoprolol tartrate (LOPRESSOR) 25 MG tablet Take 0.5 tablets (12.5 mg total) by mouth 2 (two) times daily. 03/18/23   Meriam Sprague, MD  Multiple Vitamins-Minerals (EYE VITAMINS PO) Take 1 tablet by mouth daily.    [provider]  OLANZapine (ZYPREXA) 15 MG tablet Take 15 mg by mouth at bedtime.    [provider]  OLANZapine-Samidorphan (LYBALVI) 20-10 MG TABS Take 20 mg by mouth at bedtime. 03/19/23   Shanna Cisco, NP  omeprazole (PRILOSEC) 40 MG capsule TAKE 1 CAPSULE BY MOUTH ONCE DAILY 30 MINUTES BEFORE BREAKFAST 03/10/23   Meredith Pel, NP  ondansetron (ZOFRAN-ODT) 4 MG disintegrating tablet Take 1 tablet (4 mg total) by mouth every 8 (eight) hours as needed for nausea or vomiting. 09/03/22   Marcine Matar, MD  SUMAtriptan (IMITREX) 100 MG tablet TAKE 1 TABLET EARLIEST ONSET OF MIGRAINE. MAY REPEAT IN 2 HOURS IF HEADACHE PERSISTS OR RECURS. MAXIMUM 2 TABLETS IN 24 HOURS. 03/11/23 03/10/24  Drema Dallas, DO  triamcinolone cream (KENALOG) 0.1 % Apply 1 Application topically 2 (two) times daily. 08/12/22   Marcine Matar, MD  TRUEPLUS LANCETS 28G MISC Check blood sugars three time a day 02/04/19   Marcine Matar, MD  zolpidem (AMBIEN) 5 MG tablet Take 1 tablet (5 mg total) by mouth at bedtime as needed for sleep. 03/19/23   Shanna Cisco, NP  cetirizine (ZYRTEC) 10 MG tablet Take 1  tablet (10 mg total) by mouth daily. Patient not taking: No sig reported 03/31/19 03/03/20  Hoy Register, MD      Allergies    Aspirin and Oxycodone    Review of Systems   Review of Systems  All other systems reviewed and are negative.   Physical Exam Updated Vital Signs BP 127/82 (BP Location: Left Arm)   Pulse (!) 107   Temp 98.1 F (36.7 C) (Oral)   Resp 18   Ht 5\' 3"  (1.6 m)   Wt 104.3 kg   SpO2 99%   BMI 40.74 kg/m  Physical Exam Vitals and nursing note reviewed.  Constitutional:      Appearance: She is well-developed.  HENT:     Head: Atraumatic.  Cardiovascular:     Rate and Rhythm: Normal rate.  Pulmonary:     Effort: Pulmonary effort is normal.  Musculoskeletal:        General: Tenderness present.     Cervical back: Normal range of motion and neck supple.     Comments: Bilateral lower extremity evaluation reveals no evidence of pitting edema.  There is no gross unilateral swelling, but patient has subjective feeling that her legs are swollen and she has noticeable tenderness to palpation on her calf bilaterally.  Skin:    General: Skin is warm and dry.  Neurological:     Mental Status: She is alert and oriented to person, place, and time.     ED Results / Procedures / Treatments   Labs (all labs ordered are listed, but only abnormal results are displayed) Labs Reviewed - No data to display  EKG None  Radiology US Venous Img Lower Bilateral  Result Date: 04/18/2023 CLINICAL DATA:  Lower extremity swelling, pain, recent surgery EXAM: BILATERAL LOWER EXTREMITY VENOUS DOPPLER ULTRASOUND TECHNIQUE: Gray-scale sonography with compression, as well as color and duplex ultrasound, were performed to evaluate the deep venous system(s) from the level of the common femoral vein through the popliteal and proximal calf veins. COMPARISON:  None Available. FINDINGS: VENOUS Normal compressibility of the bilateral common femoral, superficial femoral, and popliteal veins,  as well as the visualized calf veins. Visualized portions of profunda femoral vein and great saphenous vein unremarkable. No filling defects to suggest DVT on grayscale or color Doppler imaging. Doppler waveforms show normal direction of venous flow, normal respiratory plasticity and response to augmentation. OTHER None. Limitations: none IMPRESSION: Negative for DVT in the bilateral lower extremities. Negative. Electronically Signed   By: Wiliam Ke M.D.   On: 04/18/2023 13:09    Procedures Procedures    Medications Ordered in ED Medications - No data to display  ED Course/ Medical Decision Making/ A&P                             Medical Decision Making  52 year old female comes in with chief complaint of bilateral leg pain, left worse than right along with swelling.  She has history of cholecystectomy that was done about 2 weeks ago.  Patient also used to be on oral contraceptives.  I reviewed patient's records including her recent lab workup that revealed normal creatinine.  I have also reviewed patient's surgery note from recent cholecystectomy.  Differential diagnosis for this patient includes acute DVT.  She has 2+ dorsalis pedis, no pitting edema and it does not appear that she has new CHF or new renal failure.  She has normal albumin as well as per recent lab workup.  Exam not consistent with cellulitis or vascular insufficiency.  Plan is to get ultrasound DVT bilateral.  If results are negative, advised that she follows up with her PCP.    Final Clinical Impression(s) / ED Diagnoses Final diagnoses:  Leg pain, bilateral    Rx / DC Orders ED Discharge Orders     None         Derwood Kaplan, MD 04/18/23 1336

## 2023-04-18 NOTE — Telephone Encounter (Signed)
Summary: left leg swelling   Pt stated that she has been experiencing left leg swelling, which has been going on since after her surgery. She stated her surgery was on April 10.  Seeking clinical advice.      Called pt - phone answered and "Beeped". Unsure if machine or not - left message to return call.

## 2023-04-18 NOTE — Telephone Encounter (Signed)
Reason for Disposition  [1] MODERATE leg swelling (e.g., swelling extends up to knees) AND [2] new-onset or worsening    Recommended she go on to the ED since it's Friday so they can r/o a blood clot with an ultrasound, if needed.  Answer Assessment - Initial Assessment Questions 1. ONSET: "When did the swelling start?" (e.g., minutes, hours, days)     Left leg is more swollen.  It's my calf down to my foot.    Right leg is mildly swollen.      I had gallbladder surgery on April. 2. LOCATION: "What part of the leg is swollen?"  "Are both legs swollen or just one leg?"     From calf down to my foot.  3. SEVERITY: "How bad is the swelling?" (e.g., localized; mild, moderate, severe)   - Localized: Small area of swelling localized to one leg.   - MILD pedal edema: Swelling limited to foot and ankle, pitting edema < 1/4 inch (6 mm) deep, rest and elevation eliminate most or all swelling.   - MODERATE edema: Swelling of lower leg to knee, pitting edema > 1/4 inch (6 mm) deep, rest and elevation only partially reduce swelling.   - SEVERE edema: Swelling extends above knee, facial or hand swelling present.      No open sores or wound 4. REDNESS: "Does the swelling look red or infected?"     No 5. PAIN: "Is the swelling painful to touch?" If Yes, ask: "How painful is it?"   (Scale 1-10; mild, moderate or severe)     No 6. FEVER: "Do you have a fever?" If Yes, ask: "What is it, how was it measured, and when did it start?"      Not asked 7. CAUSE: "What do you think is causing the leg swelling?"     I don't know 8. MEDICAL HISTORY: "Do you have a history of blood clots (e.g., DVT), cancer, heart failure, kidney disease, or liver failure?"     I'm not sure 9. RECURRENT SYMPTOM: "Have you had leg swelling before?" If Yes, ask: "When was the last time?" "What happened that time?"     No 10. OTHER SYMPTOMS: "Do you have any other symptoms?" (e.g., chest pain, difficulty breathing)       Tiredness  11.  PREGNANCY: "Is there any chance you are pregnant?" "When was your last menstrual period?"       Not asked  Protocols used: Leg Swelling and Edema-A-AH

## 2023-04-18 NOTE — ED Triage Notes (Signed)
Bilateral leg swelling. Started 4/20. Pt denies heart disease or history of swelling.

## 2023-04-21 NOTE — Telephone Encounter (Signed)
PA approved 04/21/23-07/16/23, PA#: [id number].

## 2023-04-22 ENCOUNTER — Other Ambulatory Visit: Payer: Self-pay

## 2023-04-23 ENCOUNTER — Other Ambulatory Visit: Payer: Self-pay

## 2023-04-24 ENCOUNTER — Ambulatory Visit: Payer: Self-pay | Admitting: *Deleted

## 2023-04-24 DIAGNOSIS — Z794 Long term (current) use of insulin: Secondary | ICD-10-CM | POA: Diagnosis not present

## 2023-04-24 DIAGNOSIS — H353131 Nonexudative age-related macular degeneration, bilateral, early dry stage: Secondary | ICD-10-CM | POA: Diagnosis not present

## 2023-04-24 DIAGNOSIS — H02883 Meibomian gland dysfunction of right eye, unspecified eyelid: Secondary | ICD-10-CM | POA: Diagnosis not present

## 2023-04-24 DIAGNOSIS — E119 Type 2 diabetes mellitus without complications: Secondary | ICD-10-CM | POA: Diagnosis not present

## 2023-04-24 DIAGNOSIS — H0102B Squamous blepharitis left eye, upper and lower eyelids: Secondary | ICD-10-CM | POA: Diagnosis not present

## 2023-04-24 DIAGNOSIS — H02886 Meibomian gland dysfunction of left eye, unspecified eyelid: Secondary | ICD-10-CM | POA: Diagnosis not present

## 2023-04-24 DIAGNOSIS — H0102A Squamous blepharitis right eye, upper and lower eyelids: Secondary | ICD-10-CM | POA: Diagnosis not present

## 2023-04-24 LAB — HM DIABETES EYE EXAM

## 2023-04-24 NOTE — Telephone Encounter (Signed)
Message from Arther Dames sent at 04/24/2023  2:55 PM EDT  Summary: Feet swelling   Patient states that her feet started swelling and it keeps getting worse. Patient states that she went to ed on 4/26 and they are not getting any better. Please advise.          Call History   Type Contact Phone/Fax User  04/24/2023 02:53 PM EDT Phone (Incoming) Ambri, Miltner R (Self) [phone] (M) Mabe, Hennie Duos   Reason for Disposition  [1] MILD swelling of both ankles (i.e., pedal edema) AND [2] new-onset or worsening  Answer Assessment - Initial Assessment Questions 1. ONSET: "When did the swelling start?" (e.g., minutes, hours, days)     I'm having swelling in both of my legs.   My ankles and feet are swollen.   Went to ED 04/18/2023    No blood clot. 2. LOCATION: "What part of the leg is swollen?"  "Are both legs swollen or just one leg?"     From calves to my feet are swollen 3. SEVERITY: "How bad is the swelling?" (e.g., localized; mild, moderate, severe)   - Localized: Small area of swelling localized to one leg.   - MILD pedal edema: Swelling limited to foot and ankle, pitting edema < 1/4 inch (6 mm) deep, rest and elevation eliminate most or all swelling.   - MODERATE edema: Swelling of lower leg to knee, pitting edema > 1/4 inch (6 mm) deep, rest and elevation only partially reduce swelling.   - SEVERE edema: Swelling extends above knee, facial or hand swelling present.      Moderate 4. REDNESS: "Does the swelling look red or infected?"     No 5. PAIN: "Is the swelling painful to touch?" If Yes, ask: "How painful is it?"   (Scale 1-10; mild, moderate or severe)     Uncomfortable but don't hurt 6. FEVER: "Do you have a fever?" If Yes, ask: "What is it, how was it measured, and when did it start?"      Uncomfortable but not painful 7. CAUSE: "What do you think is causing the leg swelling?"     I had my gallbladder removed on 04/02/2023.     8. MEDICAL HISTORY: "Do you have a history  of blood clots (e.g., DVT), cancer, heart failure, kidney disease, or liver failure?"     No 9. RECURRENT SYMPTOM: "Have you had leg swelling before?" If Yes, ask: "When was the last time?" "What happened that time?"     No 10. OTHER SYMPTOMS: "Do you have any other symptoms?" (e.g., chest pain, difficulty breathing)       No shortness of breath or chest pain 11. PREGNANCY: "Is there any chance you are pregnant?" "When was your last menstrual period?"       N/A  Protocols used: Leg Swelling and Edema-A-AH

## 2023-04-24 NOTE — Telephone Encounter (Signed)
  Chief Complaint: Bilateral leg swelling.  Went ED 04/18/2023 and they ruled out a blood clot. Symptoms: Bilateral legs swollen from knees down but worse around ankles and feet.  Had lap. Chole on 04/02/2023.  Frequency: swelling getting worse Pertinent Negatives: Patient denies chest pain or shortness of breath, no pain in legs just discomfort.  Denies redness or temperature difference.  No cardiac history when asked. Disposition: [] ED /[] Urgent Care (no appt availability in office) / [x] Appointment(In office/virtual)/ []  [place] [facility]/ [] Home Care/ [] Refused Recommended Disposition /[] [place] [facility]/ []  Follow-up with PCP Additional Notes: Appt. Made for first available opening at [facility] for 05/08/2023 at 10:50 with Georgian Co, PA with the understanding she will return to the ED if she develops shortness of breath or chest pain or her legs become painful, one leg warmer than the other or pain in one leg especially in the calf of either leg.    She verbalized understanding.

## 2023-04-30 ENCOUNTER — Other Ambulatory Visit: Payer: Self-pay

## 2023-04-30 ENCOUNTER — Inpatient Hospital Stay (HOSPITAL_COMMUNITY)
Admission: EM | Admit: 2023-04-30 | Discharge: 2023-05-03 | DRG: 638 | Disposition: A | Discharge status: Home / Self Care | Payer: Medicaid Other | Attending: Internal Medicine | Admitting: Internal Medicine

## 2023-04-30 DIAGNOSIS — N179 Acute kidney failure, unspecified: Secondary | ICD-10-CM | POA: Diagnosis present

## 2023-04-30 DIAGNOSIS — E1310 Other specified diabetes mellitus with ketoacidosis without coma: Principal | ICD-10-CM

## 2023-04-30 DIAGNOSIS — K219 Gastro-esophageal reflux disease without esophagitis: Secondary | ICD-10-CM | POA: Diagnosis not present

## 2023-04-30 DIAGNOSIS — J45909 Unspecified asthma, uncomplicated: Secondary | ICD-10-CM | POA: Diagnosis not present

## 2023-04-30 DIAGNOSIS — I2510 Atherosclerotic heart disease of native coronary artery without angina pectoris: Secondary | ICD-10-CM | POA: Diagnosis not present

## 2023-04-30 DIAGNOSIS — Z6841 Body Mass Index (BMI) 40.0 and over, adult: Secondary | ICD-10-CM

## 2023-04-30 DIAGNOSIS — Z90710 Acquired absence of both cervix and uterus: Secondary | ICD-10-CM

## 2023-04-30 DIAGNOSIS — E876 Hypokalemia: Secondary | ICD-10-CM | POA: Diagnosis not present

## 2023-04-30 DIAGNOSIS — F419 Anxiety disorder, unspecified: Secondary | ICD-10-CM | POA: Diagnosis present

## 2023-04-30 DIAGNOSIS — Z79899 Other long term (current) drug therapy: Secondary | ICD-10-CM

## 2023-04-30 DIAGNOSIS — E1165 Type 2 diabetes mellitus with hyperglycemia: Secondary | ICD-10-CM

## 2023-04-30 DIAGNOSIS — T383X6A Underdosing of insulin and oral hypoglycemic [antidiabetic] drugs, initial encounter: Secondary | ICD-10-CM | POA: Diagnosis not present

## 2023-04-30 DIAGNOSIS — Z794 Long term (current) use of insulin: Secondary | ICD-10-CM | POA: Diagnosis not present

## 2023-04-30 DIAGNOSIS — Z8249 Family history of ischemic heart disease and other diseases of the circulatory system: Secondary | ICD-10-CM

## 2023-04-30 DIAGNOSIS — Z885 Allergy status to narcotic agent status: Secondary | ICD-10-CM

## 2023-04-30 DIAGNOSIS — Z87891 Personal history of nicotine dependence: Secondary | ICD-10-CM | POA: Diagnosis not present

## 2023-04-30 DIAGNOSIS — E785 Hyperlipidemia, unspecified: Secondary | ICD-10-CM | POA: Diagnosis not present

## 2023-04-30 DIAGNOSIS — E1169 Type 2 diabetes mellitus with other specified complication: Secondary | ICD-10-CM

## 2023-04-30 DIAGNOSIS — Z91138 Patient's unintentional underdosing of medication regimen for other reason: Secondary | ICD-10-CM | POA: Diagnosis not present

## 2023-04-30 DIAGNOSIS — E1110 Type 2 diabetes mellitus with ketoacidosis without coma: Secondary | ICD-10-CM | POA: Diagnosis not present

## 2023-04-30 DIAGNOSIS — F32A Depression, unspecified: Secondary | ICD-10-CM | POA: Diagnosis present

## 2023-04-30 DIAGNOSIS — R739 Hyperglycemia, unspecified: Secondary | ICD-10-CM | POA: Diagnosis not present

## 2023-04-30 DIAGNOSIS — Z800 Family history of malignant neoplasm of digestive organs: Secondary | ICD-10-CM

## 2023-04-30 DIAGNOSIS — F411 Generalized anxiety disorder: Secondary | ICD-10-CM | POA: Diagnosis present

## 2023-04-30 DIAGNOSIS — Z833 Family history of diabetes mellitus: Secondary | ICD-10-CM | POA: Diagnosis not present

## 2023-04-30 DIAGNOSIS — Z7984 Long term (current) use of oral hypoglycemic drugs: Secondary | ICD-10-CM | POA: Diagnosis not present

## 2023-04-30 DIAGNOSIS — R000 Tachycardia, unspecified: Secondary | ICD-10-CM | POA: Diagnosis not present

## 2023-04-30 DIAGNOSIS — Z818 Family history of other mental and behavioral disorders: Secondary | ICD-10-CM

## 2023-04-30 DIAGNOSIS — I10 Essential (primary) hypertension: Secondary | ICD-10-CM | POA: Diagnosis not present

## 2023-04-30 DIAGNOSIS — Z886 Allergy status to analgesic agent status: Secondary | ICD-10-CM | POA: Diagnosis not present

## 2023-04-30 LAB — GLUCOSE, CAPILLARY
Glucose-Capillary: >600 mg/dL (ref 70–99)
Glucose-Capillary: 460 mg/dL — ABNORMAL HIGH (ref 70–99)
Glucose-Capillary: 546 mg/dL (ref 70–99)
Glucose-Capillary: 462 mg/dL — ABNORMAL HIGH (ref 70–99)
Glucose-Capillary: 462 mg/dL — ABNORMAL HIGH (ref 70–99)
Glucose-Capillary: 372 mg/dL — ABNORMAL HIGH (ref 70–99)
Glucose-Capillary: 287 mg/dL — ABNORMAL HIGH (ref 70–99)
Glucose-Capillary: 250 mg/dL — ABNORMAL HIGH (ref 70–99)
Glucose-Capillary: 252 mg/dL — ABNORMAL HIGH (ref 70–99)
Glucose-Capillary: 256 mg/dL — ABNORMAL HIGH (ref 70–99)
Glucose-Capillary: 244 mg/dL — ABNORMAL HIGH (ref 70–99)
Glucose-Capillary: 192 mg/dL — ABNORMAL HIGH (ref 70–99)
Glucose-Capillary: 170 mg/dL — ABNORMAL HIGH (ref 70–99)
Glucose-Capillary: 220 mg/dL — ABNORMAL HIGH (ref 70–99)
Glucose-Capillary: 191 mg/dL — ABNORMAL HIGH (ref 70–99)
Glucose-Capillary: 307 mg/dL — ABNORMAL HIGH (ref 70–99)
Glucose-Capillary: 260 mg/dL — ABNORMAL HIGH (ref 70–99)

## 2023-04-30 LAB — BLOOD GAS, VENOUS
Acid-base deficit: 19.7 mmol/L — ABNORMAL HIGH (ref 0.0–2.0)
Bicarbonate: 9.2 mmol/L — ABNORMAL LOW (ref 20.0–28.0)
O2 Saturation: 93 %
Patient temperature: 37
pCO2, Ven: 31 mmHg — ABNORMAL LOW (ref 44–60)
pH, Ven: 7.08 — CL (ref 7.25–7.43)
pO2, Ven: 69 mmHg — ABNORMAL HIGH (ref 32–45)

## 2023-04-30 LAB — BASIC METABOLIC PANEL
Anion gap: 24 — ABNORMAL HIGH (ref 5–15)
Anion gap: 18 — ABNORMAL HIGH (ref 5–15)
Anion gap: 8 (ref 5–15)
Anion gap: 8 (ref 5–15)
Anion gap: 9 (ref 5–15)
BUN: 23 mg/dL — ABNORMAL HIGH (ref 6–20)
BUN: 22 mg/dL — ABNORMAL HIGH (ref 6–20)
BUN: 25 mg/dL — ABNORMAL HIGH (ref 6–20)
BUN: 22 mg/dL — ABNORMAL HIGH (ref 6–20)
BUN: 19 mg/dL (ref 6–20)
CO2: 9 mmol/L — ABNORMAL LOW (ref 22–32)
CO2: 12 mmol/L — ABNORMAL LOW (ref 22–32)
CO2: 18 mmol/L — ABNORMAL LOW (ref 22–32)
CO2: 21 mmol/L — ABNORMAL LOW (ref 22–32)
CO2: 20 mmol/L — ABNORMAL LOW (ref 22–32)
Calcium: 9.4 mg/dL (ref 8.9–10.3)
Calcium: 9.4 mg/dL (ref 8.9–10.3)
Calcium: 9.9 mg/dL (ref 8.9–10.3)
Calcium: 9.7 mg/dL (ref 8.9–10.3)
Calcium: 9.2 mg/dL (ref 8.9–10.3)
Chloride: 90 mmol/L — ABNORMAL LOW (ref 98–111)
Chloride: 102 mmol/L (ref 98–111)
Chloride: 110 mmol/L (ref 98–111)
Chloride: 109 mmol/L (ref 98–111)
Chloride: 107 mmol/L (ref 98–111)
Creatinine, Ser: 1.83 mg/dL — ABNORMAL HIGH (ref 0.44–1.00)
Creatinine, Ser: 1.64 mg/dL — ABNORMAL HIGH (ref 0.44–1.00)
Creatinine, Ser: 1.39 mg/dL — ABNORMAL HIGH (ref 0.44–1.00)
Creatinine, Ser: 1.21 mg/dL — ABNORMAL HIGH (ref 0.44–1.00)
Creatinine, Ser: 1.25 mg/dL — ABNORMAL HIGH (ref 0.44–1.00)
GFR, Estimated: 33 mL/min — ABNORMAL LOW (ref >60)
GFR, Estimated: 37 mL/min — ABNORMAL LOW (ref >60)
GFR, Estimated: 46 mL/min — ABNORMAL LOW (ref >60)
GFR, Estimated: 54 mL/min — ABNORMAL LOW (ref >60)
GFR, Estimated: 52 mL/min — ABNORMAL LOW (ref >60)
Glucose, Bld: 1153 mg/dL (ref 70–99)
Glucose, Bld: 570 mg/dL (ref 70–99)
Glucose, Bld: 280 mg/dL — ABNORMAL HIGH (ref 70–99)
Glucose, Bld: 186 mg/dL — ABNORMAL HIGH (ref 70–99)
Glucose, Bld: 271 mg/dL — ABNORMAL HIGH (ref 70–99)
Potassium: 5.8 mmol/L — ABNORMAL HIGH (ref 3.5–5.1)
Potassium: 4.2 mmol/L (ref 3.5–5.1)
Potassium: 4 mmol/L (ref 3.5–5.1)
Potassium: 3.7 mmol/L (ref 3.5–5.1)
Potassium: 3.7 mmol/L (ref 3.5–5.1)
Sodium: 123 mmol/L — ABNORMAL LOW (ref 135–145)
Sodium: 132 mmol/L — ABNORMAL LOW (ref 135–145)
Sodium: 136 mmol/L (ref 135–145)
Sodium: 138 mmol/L (ref 135–145)
Sodium: 136 mmol/L (ref 135–145)

## 2023-04-30 LAB — HEMOGLOBIN A1C
Hgb A1c MFr Bld: 13 % — ABNORMAL HIGH (ref 4.8–5.6)
Mean Plasma Glucose: 326.4 mg/dL

## 2023-04-30 LAB — CBC
HCT: 49.2 % — ABNORMAL HIGH (ref 36.0–46.0)
Hemoglobin: 14.7 g/dL (ref 12.0–15.0)
MCH: 28.4 pg (ref 26.0–34.0)
MCHC: 29.9 g/dL — ABNORMAL LOW (ref 30.0–36.0)
MCV: 95 fL (ref 80.0–100.0)
Platelets: 439 10*3/uL — ABNORMAL HIGH (ref 150–400)
RBC: 5.18 MIL/uL — ABNORMAL HIGH (ref 3.87–5.11)
RDW: 14.6 % (ref 11.5–15.5)
WBC: 13.2 10*3/uL — ABNORMAL HIGH (ref 4.0–10.5)
nRBC: 0 % (ref 0.0–0.2)

## 2023-04-30 LAB — MAGNESIUM: Magnesium: 2.4 mg/dL (ref 1.7–2.4)

## 2023-04-30 LAB — BETA-HYDROXYBUTYRIC ACID
Beta-Hydroxybutyric Acid: 7.56 mmol/L — ABNORMAL HIGH (ref 0.05–0.27)
Beta-Hydroxybutyric Acid: 1.3 mmol/L — ABNORMAL HIGH (ref 0.05–0.27)
Beta-Hydroxybutyric Acid: 0.15 mmol/L (ref 0.05–0.27)

## 2023-04-30 LAB — CBG MONITORING, ED
Glucose-Capillary: >600 mg/dL (ref 70–99)
Glucose-Capillary: >600 mg/dL (ref 70–99)
Glucose-Capillary: >600 mg/dL (ref 70–99)
Glucose-Capillary: >600 mg/dL (ref 70–99)

## 2023-04-30 LAB — MRSA NEXT GEN BY PCR, NASAL: MRSA by PCR Next Gen: DETECTED — AB

## 2023-04-30 MED ORDER — METOPROLOL TARTRATE 25 MG PO TABS
25.0000 mg | ORAL_TABLET | Freq: Two times a day (BID) | ORAL | Status: DC
  Administered 2023-04-30 – 2023-05-01 (×2): 25 mg via ORAL
  Filled 2023-04-30 (×2): qty 1

## 2023-04-30 MED ORDER — HYDRALAZINE HCL 25 MG PO TABS
25.0000 mg | ORAL_TABLET | Freq: Four times a day (QID) | ORAL | Status: DC | PRN
  Administered 2023-04-30: 25 mg via ORAL
  Filled 2023-04-30: qty 1

## 2023-04-30 MED ORDER — MUPIROCIN 2 % EX OINT
1.0000 | TOPICAL_OINTMENT | Freq: Two times a day (BID) | CUTANEOUS | Status: DC
  Administered 2023-04-30 – 2023-05-03 (×6): 1 via NASAL
  Filled 2023-04-30 (×2): qty 22

## 2023-04-30 MED ORDER — INSULIN ASPART 100 UNIT/ML IJ SOLN
0.0000 [IU] | Freq: Every day | INTRAMUSCULAR | Status: DC
  Administered 2023-04-30: 3 [IU] via SUBCUTANEOUS
  Administered 2023-05-01: 2 [IU] via SUBCUTANEOUS
  Administered 2023-05-02: 3 [IU] via SUBCUTANEOUS

## 2023-04-30 MED ORDER — LABETALOL HCL 5 MG/ML IV SOLN
5.0000 mg | INTRAVENOUS | Status: DC | PRN
  Administered 2023-04-30: 5 mg via INTRAVENOUS
  Filled 2023-04-30: qty 4

## 2023-04-30 MED ORDER — INSULIN GLARGINE-YFGN 100 UNIT/ML ~~LOC~~ SOLN
40.0000 [IU] | Freq: Two times a day (BID) | SUBCUTANEOUS | Status: DC
  Administered 2023-04-30 – 2023-05-01 (×3): 40 [IU] via SUBCUTANEOUS
  Filled 2023-04-30 (×4): qty 0.4

## 2023-04-30 MED ORDER — ONDANSETRON HCL 4 MG/2ML IJ SOLN
4.0000 mg | Freq: Four times a day (QID) | INTRAMUSCULAR | Status: DC | PRN
  Administered 2023-05-01: 4 mg via INTRAVENOUS
  Filled 2023-04-30: qty 2

## 2023-04-30 MED ORDER — CHLORHEXIDINE GLUCONATE CLOTH 2 % EX PADS: 6.0000 | MEDICATED_PAD | Freq: Every day | CUTANEOUS | Status: DC

## 2023-04-30 MED ORDER — INSULIN ASPART 100 UNIT/ML IJ SOLN
0.0000 [IU] | Freq: Three times a day (TID) | INTRAMUSCULAR | Status: DC
  Administered 2023-05-01: 20 [IU] via SUBCUTANEOUS
  Administered 2023-05-01: 3 [IU] via SUBCUTANEOUS
  Administered 2023-05-01: 11 [IU] via SUBCUTANEOUS
  Administered 2023-05-02: 7 [IU] via SUBCUTANEOUS
  Administered 2023-05-02: 15 [IU] via SUBCUTANEOUS
  Administered 2023-05-02: 4 [IU] via SUBCUTANEOUS
  Administered 2023-05-03: 7 [IU] via SUBCUTANEOUS

## 2023-04-30 MED ORDER — INSULIN ASPART 100 UNIT/ML IJ SOLN
12.0000 [IU] | Freq: Three times a day (TID) | INTRAMUSCULAR | Status: DC
  Administered 2023-05-01 (×2): 12 [IU] via SUBCUTANEOUS

## 2023-04-30 MED ORDER — CHLORHEXIDINE GLUCONATE CLOTH 2 % EX PADS
6.0000 | MEDICATED_PAD | Freq: Every day | CUTANEOUS | Status: DC
  Administered 2023-05-01: 6 via TOPICAL

## 2023-04-30 MED ORDER — AMLODIPINE BESYLATE 5 MG PO TABS
5.0000 mg | ORAL_TABLET | Freq: Every day | ORAL | Status: DC
  Administered 2023-04-30: 5 mg via ORAL
  Filled 2023-04-30: qty 1

## 2023-04-30 MED ORDER — METOPROLOL TARTRATE 12.5 MG HALF TABLET
12.5000 mg | ORAL_TABLET | Freq: Two times a day (BID) | ORAL | Status: DC
  Administered 2023-04-30: 12.5 mg via ORAL
  Filled 2023-04-30: qty 1

## 2023-04-30 MED ORDER — LACTATED RINGERS IV BOLUS
1000.0000 mL | Freq: Once | INTRAVENOUS | Status: AC
  Administered 2023-04-30: 1000 mL via INTRAVENOUS

## 2023-04-30 MED ORDER — LABETALOL HCL 5 MG/ML IV SOLN: 10.0000 mg | INTRAVENOUS | Status: DC | PRN

## 2023-04-30 MED ORDER — HYDRALAZINE HCL 50 MG PO TABS: 50.0000 mg | ORAL_TABLET | Freq: Four times a day (QID) | ORAL | Status: DC | PRN

## 2023-04-30 MED ORDER — SODIUM CHLORIDE 0.9 % IV BOLUS
1000.0000 mL | Freq: Once | INTRAVENOUS | Status: AC
  Administered 2023-04-30: 1000 mL via INTRAVENOUS

## 2023-04-30 MED ORDER — AMLODIPINE BESYLATE 10 MG PO TABS
10.0000 mg | ORAL_TABLET | Freq: Every day | ORAL | Status: DC
  Administered 2023-05-01 – 2023-05-03 (×3): 10 mg via ORAL
  Filled 2023-04-30 (×3): qty 1

## 2023-04-30 MED ORDER — ORAL CARE MOUTH RINSE: 15.0000 mL | OROMUCOSAL | Status: DC | PRN

## 2023-04-30 MED ORDER — LACTATED RINGERS IV SOLN: INTRAVENOUS | Status: DC

## 2023-04-30 MED ORDER — DEXTROSE IN LACTATED RINGERS 5 % IV SOLN: INTRAVENOUS | Status: DC

## 2023-04-30 MED ORDER — INSULIN REGULAR(HUMAN) IN NACL 100-0.9 UT/100ML-% IV SOLN
INTRAVENOUS | Status: DC
  Administered 2023-04-30: 8.5 [IU]/h via INTRAVENOUS
  Administered 2023-04-30: 13 [IU]/h via INTRAVENOUS
  Filled 2023-04-30 (×2): qty 100

## 2023-04-30 MED ORDER — DEXTROSE 50 % IV SOLN: 0.0000 mL | INTRAVENOUS | Status: DC | PRN

## 2023-04-30 MED ORDER — AMLODIPINE BESYLATE 5 MG PO TABS
5.0000 mg | ORAL_TABLET | Freq: Once | ORAL | Status: AC
  Administered 2023-04-30: 5 mg via ORAL
  Filled 2023-04-30: qty 1

## 2023-04-30 MED ORDER — ACETAMINOPHEN 325 MG PO TABS: 650.0000 mg | ORAL_TABLET | Freq: Four times a day (QID) | ORAL | Status: DC | PRN

## 2023-04-30 MED ORDER — ACETAMINOPHEN 650 MG RE SUPP: 650.0000 mg | Freq: Four times a day (QID) | RECTAL | Status: DC | PRN

## 2023-04-30 NOTE — Progress Notes (Signed)
Carryover admission to the Day Admitter.  I discussed this case with the EDP, Army Melia, PA.  Per these discussions:   This is a 52 year old female with diabetes, previous history of DKA, who is being admitted with DKA after presenting with nausea, vomiting over the course of the last day after her insulin pump stopped working.  Presenting glucose greater than thousand, with anion gap 24, elevated beta hydroxybutyric acid to 7.55, with VBG consistent with metabolic acidemia.  Started on insulin drip via Endo tool.   I have placed an order for observation to the stepdown unit for further evaluation management of presenting DKA.  She has existing orders for every 4 hours BMPs.  I have placed some additional preliminary admit orders via the adult multi-morbid admission order set. I have also ordered every hour Accu-Cheks and have added on a serum magnesium level.  I also ordered prn IV Zofran.    Newton Pigg, DO Hospitalist

## 2023-04-30 NOTE — H&P (Signed)
History and Physical    Patient: Whitney Benton:[id number] DOB: 05/20/1971 DOA: 04/30/2023 DOS: the patient was seen and examined on 04/30/2023 PCP: Marcine Matar, MD  Patient coming from: Home  Chief Complaint:  Chief Complaint  Patient presents with   Hyperglycemia   HPI: Whitney Benton is a 52 y.o. female with medical history significant of type 2 diabetes mellitus, history of previous DKA, HTN, CAD, HLD, history of brain headache, anxiety/depression, generalized anxiety disorder, obesity who presented to [facility] ED on 5/8 with complaints of nausea, vomiting and concerned that her blood sugar is high.  Patient reports that she utilizes a insulin pump but has been out of her insulin since Friday.  In the ED, temperature 98.9 F, HR 135, RR 24, BP 147/103, SpO2 95% on room air.  WBC 13.2, hemoglobin 14.7, platelets 439.  Sodium 123, potassium 5.8, chloride 90, CO2 9, glucose 1153, BUN 23, creatinine 1.83.  Beta hydroxybutyrate acid 7.56.  VBG with pH 7.08, pCO2 31, pCO2 69.  Was placed on insulin drip.  TRH consulted for admission for further evaluation management of DKA in the setting of noncompliance with home insulin.    Review of Systems: As mentioned in the history of present illness. All other systems reviewed and are negative. Past Medical History:  Diagnosis Date   Allergy    Anxiety    Arthritis    Asthma    Depression    Diabetes mellitus without complication (HCC)    GERD (gastroesophageal reflux disease)    Glaucoma suspect    Hyperlipidemia    Trichomonas infection    Past Surgical History:  Procedure Laterality Date   CESAREAN SECTION     UPPER GASTROINTESTINAL ENDOSCOPY     VAGINAL HYSTERECTOMY  12/23/2004   Fibroids, menorrhagia, benign pathology   Social History:  reports that she quit smoking about 3 years ago. Her smoking use included cigarettes. She has a 2.00 pack-year smoking history. She has never used smokeless tobacco. She reports  that she does not currently use alcohol. She reports current drug use. Drug: Marijuana.  Allergies  Allergen Reactions   Aspirin Hives   Oxycodone Nausea And Vomiting    Family History  Problem Relation Age of Onset   Diabetes Mother    Mental illness Mother    Depression Mother    Hypertension Mother    Colon cancer Father 7   Hypertension Father    Diabetes Father    Colon cancer Paternal Grandmother    Esophageal cancer Neg Hx    Stomach cancer Neg Hx    Rectal cancer Neg Hx     Prior to Admission medications   Medication Sig Start Date End Date Taking? Authorizing Provider  acetaminophen (TYLENOL) 500 MG tablet Take 1 tablet (500 mg total) by mouth every 6 (six) hours as needed. 10/06/22   Redwine, Madison A, PA-C  acetaminophen-codeine (TYLENOL #3) 300-30 MG tablet Take 1 tablet by mouth 2 (two) times daily as needed for moderate pain. 12/10/22   Cristie Hem, PA-C  albuterol (VENTOLIN HFA) 108 (90 Base) MCG/ACT inhaler Inhale 2 puffs into the lungs every 6 (six) hours as needed for wheezing or shortness of breath (Cough). 02/26/23   Marcine Matar, MD  amLODipine (NORVASC) 2.5 MG tablet Take 1 tablet (2.5 mg total) by mouth daily. 02/13/23   Meriam Sprague, MD  atorvastatin (LIPITOR) 40 MG tablet Take 1 tablet (40 mg total) by mouth daily. 01/06/23  Meriam Sprague, MD  Blood Glucose Monitoring Suppl (TRUE METRIX METER) w/Device KIT Check blood sugars three times a day 08/09/21   Marcine Matar, MD  busPIRone (BUSPAR) 30 MG tablet Take 2 tablets (60 mg total) by mouth 3 (three) times daily. 03/19/23   Shanna Cisco, NP  Continuous Blood Gluc Receiver (FREESTYLE LIBRE 2 READER) DEVI Use to check blood sugar three times daily. 07/23/22   Marcine Matar, MD  Continuous Blood Gluc Sensor (FREESTYLE LIBRE 3 SENSOR) MISC Place 1 sensor every 14 (fourteen) days. 02/12/23     cycloSPORINE (RESTASIS) 0.05 % ophthalmic emulsion Apply 1 drop into both eyes twice  a day Patient not taking: Reported on 03/15/2023 09/19/21     DULoxetine (CYMBALTA) 60 MG capsule Take 1 capsule (60 mg total) by mouth daily. 03/19/23   Shanna Cisco, NP  Erenumab-aooe (AIMOVIG) 140 MG/ML SOAJ Inject 140 mg into the skin every 28 (twenty-eight) days. 03/11/23   Drema Dallas, DO  Estradiol 10 MCG TABS vaginal tablet Insert one tab intravaginally 2 times a week 11/24/22   Marcine Matar, MD  fluticasone [facility]) 50 MCG/ACT nasal spray Place 1 spray into both nostrils daily. Begin by using 2 sprays in each nare daily for 3 to 5 days, then decrease to 1 spray in each nare daily. 04/29/22   Theadora Rama Scales, PA-C  gabapentin (NEURONTIN) 300 MG capsule Take 1 capsule (300 mg total) by mouth 3 (three) times daily. 03/19/23   Shanna Cisco, NP  glucose blood (TRUE METRIX BLOOD GLUCOSE TEST) test strip Use to check blood sugar 3 times daily 07/06/21   Marcine Matar, MD  hydrOXYzine (ATARAX) 25 MG tablet TAKE 1 TABLET (25 MG TOTAL) BY MOUTH 3 (THREE) TIMES DAILY AS NEEDED. 03/19/23   Toy Cookey E, NP  insulin aspart (NOVOLOG FLEXPEN) 100 UNIT/ML FlexPen Inject 30 Units into the skin 3 (three) times daily with meals. Patient not taking: Reported on 03/15/2023 02/13/23   Marcine Matar, MD  insulin glargine (LANTUS SOLOSTAR) 100 UNIT/ML Solostar Pen Inject 54 Units into the skin 2 (two) times daily. Patient not taking: Reported on 03/15/2023 03/06/23   Marcine Matar, MD  insulin lispro (HUMALOG) 100 UNIT/ML injection Inject 1 mL (100 Units total) via pump once daily. 03/05/23     Insulin Pen Needle 31G X 6 MM MISC Use as directed. 04/16/23   Marcine Matar, MD  lidocaine (LIDODERM) 5 % Place 1 patch onto the skin daily. Remove & Discard patch within 12 hours or as directed by MD 10/06/22   Redwine, Madison A, PA-C  metFORMIN (GLUCOPHAGE-XR) 500 MG 24 hr tablet Take 1 tablet by mouth twice daily 01/15/23   Marcine Matar, MD  methocarbamol (ROBAXIN) 500 MG  tablet Take 1 tablet (500 mg total) by mouth 3 (three) times daily as needed for muscle spasms. 09/04/22   Cristie Hem, PA-C  metoprolol tartrate (LOPRESSOR) 25 MG tablet Take 0.5 tablets (12.5 mg total) by mouth 2 (two) times daily. 03/18/23   Meriam Sprague, MD  Multiple Vitamins-Minerals (EYE VITAMINS PO) Take 1 tablet by mouth daily.    [provider]  OLANZapine (ZYPREXA) 15 MG tablet Take 15 mg by mouth at bedtime.    [provider]  OLANZapine-Samidorphan (LYBALVI) 20-10 MG TABS Take 20 mg by mouth at bedtime. 03/19/23   Shanna Cisco, NP  omeprazole (PRILOSEC) 40 MG capsule TAKE 1 CAPSULE BY MOUTH ONCE DAILY 30  MINUTES BEFORE BREAKFAST 03/10/23   Meredith Pel, NP  ondansetron (ZOFRAN-ODT) 4 MG disintegrating tablet Take 1 tablet (4 mg total) by mouth every 8 (eight) hours as needed for nausea or vomiting. 09/03/22   Marcine Matar, MD  polyethylene glycol (MIRALAX / GLYCOLAX) 17 g packet Take 17 g by mouth daily. 04/03/23   [provider]  SUMAtriptan (IMITREX) 100 MG tablet TAKE 1 TABLET EARLIEST ONSET OF MIGRAINE. MAY REPEAT IN 2 HOURS IF HEADACHE PERSISTS OR RECURS. MAXIMUM 2 TABLETS IN 24 HOURS. 03/11/23 03/10/24  Drema Dallas, DO  triamcinolone cream (KENALOG) 0.1 % Apply 1 Application topically 2 (two) times daily. 08/12/22   Marcine Matar, MD  TRUEPLUS LANCETS 28G MISC Check blood sugars three time a day 02/04/19   Marcine Matar, MD  zolpidem (AMBIEN) 5 MG tablet Take 1 tablet (5 mg total) by mouth at bedtime as needed for sleep. 03/19/23   Shanna Cisco, NP  cetirizine (ZYRTEC) 10 MG tablet Take 1 tablet (10 mg total) by mouth daily. Patient not taking: No sig reported 03/31/19 03/03/20  Hoy Register, MD    Physical Exam: Vitals:   04/30/23 0309 04/30/23 0345 04/30/23 0505 04/30/23 0700  BP: (!) 147/103 (!) 154/86 (!) 153/97   Pulse: (!) 135 (!) 131 (!) 128   Resp: 20 (!) 24 20   Temp: 98.9 F (37.2 C)   98.5 F  (36.9 C)  TempSrc:    Oral  SpO2: 95% 93% 94%    GEN: 52 yo female in NAD, alert and oriented x 3, obese HEENT: NCAT, PERRL, EOMI, sclera clear, dry mucous membranes PULM: CTAB w/o wheezes/crackles, normal respiratory effort, on room air CV: Tachycardic, regular rhythm w/o M/G/R GI: abd soft, NTND, NABS, no R/G/M MSK: no peripheral edema, moves all EXTR independently NEURO: CN II-XII intact, no focal deficits, sensation to light touch intact PSYCH: Depressed mood, flat affect Integumentary: dry/intact, no rashes or wounds   Assessment and Plan:  Diabetic ketoacidosis Type II Diabetes Mellitus Patient presenting with nausea/vomiting, increased thirst with elevated glucose.  On arrival, patient's glucose elevated 1153 with anion gap of 24 and acidosis noted on VBG.  Patient reports has been out of her home insulin since Friday prior to admission. -- Admit to stepdown unit -- Hemoglobin A1c 13.0, correlating with very poor control over the last 3 months -- Continue insulin drip -- BMP q4h unitl anion gap closed x 2 -- Continue IVF hydration -- N.p.o. except for sips with meds, ice chips -- Diabetic educator consult  Essential hypertension -- Amlodipine 5 mg p.o. daily -- Metoprolol tartrate 12.5 g p.o. twice daily -- Hydralazine 25 mg p.o. q6h PRN SBP >>165 -- Labetalol 5 mg IV every 2 hours as needed SBP >165; hold for HR <60  Generalized anxiety disorder Depression -- Awaiting pharmacy reconciliation before restarting  Hyperlipidemia -- Hold home atorvastatin for now  GERD -- Hold home omeprazole for now  Morbid Obesity Body mass index is 40.3 kg/m.  Discussed with patient needs for aggressive lifestyle changes/weight loss as this complicates all facets of care.  Outpatient follow-up with PCP.       Advance Care Planning:   Code Status: Full Code  Consults: None  Family Communication: Present at bedside  Severity of Illness: The appropriate patient status for  this patient is INPATIENT. Inpatient status is judged to be reasonable and necessary in order to provide the required intensity of service to ensure the patient's safety. The  patient's presenting symptoms, physical exam findings, and initial radiographic and laboratory data in the context of their chronic comorbidities is felt to place them at high risk for further clinical deterioration. Furthermore, it is not anticipated that the patient will be medically stable for discharge from the hospital within 2 midnights of admission.   * I certify that at the point of admission it is my clinical judgment that the patient will require inpatient hospital care spanning beyond 2 midnights from the point of admission due to high intensity of service, high risk for further deterioration and high frequency of surveillance required.*  Author: Alvira Philips [place], DO 04/30/2023 7:18 AM  For on call review [email or web address].

## 2023-04-30 NOTE — ED Provider Notes (Signed)
[place] EMERGENCY DEPARTMENT AT [facility] Provider Note   CSN: [id number] Arrival date & time: 04/30/23  0255     History  Chief Complaint  Patient presents with   Hyperglycemia    Whitney Benton is a 52 y.o. female.  52 year old female with past medical history of diabetes, depression, asthma, GERD, hyperlipidemia presents with complaint of high blood sugar and feeling poorly today.  States that her insulin pump is out of insulin.  Did not eat much today although did drink soda.  Has had vomiting and is very thirsty.  Reports history of DKA.       Home Medications Prior to Admission medications   Medication Sig Start Date End Date Taking? Authorizing Provider  acetaminophen (TYLENOL) 500 MG tablet Take 1 tablet (500 mg total) by mouth every 6 (six) hours as needed. 10/06/22   Redwine, Madison A, PA-C  acetaminophen-codeine (TYLENOL #3) 300-30 MG tablet Take 1 tablet by mouth 2 (two) times daily as needed for moderate pain. 12/10/22   Cristie Hem, PA-C  albuterol (VENTOLIN HFA) 108 (90 Base) MCG/ACT inhaler Inhale 2 puffs into the lungs every 6 (six) hours as needed for wheezing or shortness of breath (Cough). 02/26/23   Marcine Matar, MD  amLODipine (NORVASC) 2.5 MG tablet Take 1 tablet (2.5 mg total) by mouth daily. 02/13/23   Meriam Sprague, MD  atorvastatin (LIPITOR) 40 MG tablet Take 1 tablet (40 mg total) by mouth daily. 01/06/23   Meriam Sprague, MD  Blood Glucose Monitoring Suppl (TRUE METRIX METER) w/Device KIT Check blood sugars three times a day 08/09/21   Marcine Matar, MD  busPIRone (BUSPAR) 30 MG tablet Take 2 tablets (60 mg total) by mouth 3 (three) times daily. 03/19/23   Shanna Cisco, NP  Continuous Blood Gluc Receiver (FREESTYLE LIBRE 2 READER) DEVI Use to check blood sugar three times daily. 07/23/22   Marcine Matar, MD  Continuous Blood Gluc Sensor (FREESTYLE LIBRE 3 SENSOR) MISC Place 1 sensor every 14 (fourteen)  days. 02/12/23     cycloSPORINE (RESTASIS) 0.05 % ophthalmic emulsion Apply 1 drop into both eyes twice a day Patient not taking: Reported on 03/15/2023 09/19/21     DULoxetine (CYMBALTA) 60 MG capsule Take 1 capsule (60 mg total) by mouth daily. 03/19/23   Shanna Cisco, NP  Erenumab-aooe (AIMOVIG) 140 MG/ML SOAJ Inject 140 mg into the skin every 28 (twenty-eight) days. 03/11/23   Drema Dallas, DO  Estradiol 10 MCG TABS vaginal tablet Insert one tab intravaginally 2 times a week 11/24/22   Marcine Matar, MD  fluticasone [facility]) 50 MCG/ACT nasal spray Place 1 spray into both nostrils daily. Begin by using 2 sprays in each nare daily for 3 to 5 days, then decrease to 1 spray in each nare daily. 04/29/22   Theadora Rama Scales, PA-C  gabapentin (NEURONTIN) 300 MG capsule Take 1 capsule (300 mg total) by mouth 3 (three) times daily. 03/19/23   Shanna Cisco, NP  glucose blood (TRUE METRIX BLOOD GLUCOSE TEST) test strip Use to check blood sugar 3 times daily 07/06/21   Marcine Matar, MD  hydrOXYzine (ATARAX) 25 MG tablet TAKE 1 TABLET (25 MG TOTAL) BY MOUTH 3 (THREE) TIMES DAILY AS NEEDED. 03/19/23   Toy Cookey E, NP  insulin aspart (NOVOLOG FLEXPEN) 100 UNIT/ML FlexPen Inject 30 Units into the skin 3 (three) times daily with meals. Patient not taking: Reported on 03/15/2023 02/13/23  Marcine Matar, MD  insulin glargine (LANTUS SOLOSTAR) 100 UNIT/ML Solostar Pen Inject 54 Units into the skin 2 (two) times daily. Patient not taking: Reported on 03/15/2023 03/06/23   Marcine Matar, MD  insulin lispro (HUMALOG) 100 UNIT/ML injection Inject 1 mL (100 Units total) via pump once daily. 03/05/23     Insulin Pen Needle 31G X 6 MM MISC Use as directed. 04/16/23   Marcine Matar, MD  lidocaine (LIDODERM) 5 % Place 1 patch onto the skin daily. Remove & Discard patch within 12 hours or as directed by MD 10/06/22   Redwine, Madison A, PA-C  metFORMIN (GLUCOPHAGE-XR) 500 MG 24 hr  tablet Take 1 tablet by mouth twice daily 01/15/23   Marcine Matar, MD  methocarbamol (ROBAXIN) 500 MG tablet Take 1 tablet (500 mg total) by mouth 3 (three) times daily as needed for muscle spasms. 09/04/22   Cristie Hem, PA-C  metoprolol tartrate (LOPRESSOR) 25 MG tablet Take 0.5 tablets (12.5 mg total) by mouth 2 (two) times daily. 03/18/23   Meriam Sprague, MD  Multiple Vitamins-Minerals (EYE VITAMINS PO) Take 1 tablet by mouth daily.    [provider]  OLANZapine (ZYPREXA) 15 MG tablet Take 15 mg by mouth at bedtime.    [provider]  OLANZapine-Samidorphan (LYBALVI) 20-10 MG TABS Take 20 mg by mouth at bedtime. 03/19/23   Shanna Cisco, NP  omeprazole (PRILOSEC) 40 MG capsule TAKE 1 CAPSULE BY MOUTH ONCE DAILY 30 MINUTES BEFORE BREAKFAST 03/10/23   Meredith Pel, NP  ondansetron (ZOFRAN-ODT) 4 MG disintegrating tablet Take 1 tablet (4 mg total) by mouth every 8 (eight) hours as needed for nausea or vomiting. 09/03/22   Marcine Matar, MD  polyethylene glycol (MIRALAX / GLYCOLAX) 17 g packet Take 17 g by mouth daily. 04/03/23   [provider]  SUMAtriptan (IMITREX) 100 MG tablet TAKE 1 TABLET EARLIEST ONSET OF MIGRAINE. MAY REPEAT IN 2 HOURS IF HEADACHE PERSISTS OR RECURS. MAXIMUM 2 TABLETS IN 24 HOURS. 03/11/23 03/10/24  Drema Dallas, DO  triamcinolone cream (KENALOG) 0.1 % Apply 1 Application topically 2 (two) times daily. 08/12/22   Marcine Matar, MD  TRUEPLUS LANCETS 28G MISC Check blood sugars three time a day 02/04/19   Marcine Matar, MD  zolpidem (AMBIEN) 5 MG tablet Take 1 tablet (5 mg total) by mouth at bedtime as needed for sleep. 03/19/23   Shanna Cisco, NP  cetirizine (ZYRTEC) 10 MG tablet Take 1 tablet (10 mg total) by mouth daily. Patient not taking: No sig reported 03/31/19 03/03/20  Hoy Register, MD      Allergies    Aspirin and Oxycodone    Review of Systems   Review of Systems Negative except as per  HPI Physical Exam Updated Vital Signs BP (!) 153/97   Pulse (!) 128   Temp 98.9 F (37.2 C)   Resp 20   SpO2 94%  Physical Exam Vitals and nursing note reviewed.  Constitutional:      General: She is not in acute distress.    Appearance: She is well-developed. She is not diaphoretic.  HENT:     Head: Normocephalic and atraumatic.     Mouth/Throat:     Mouth: Mucous membranes are dry.  Eyes:     Conjunctiva/sclera: Conjunctivae normal.  Cardiovascular:     Rate and Rhythm: Regular rhythm. Tachycardia present.     Pulses: Normal pulses.     Heart sounds: Normal  heart sounds.  Pulmonary:     Effort: Pulmonary effort is normal.  Abdominal:     Palpations: Abdomen is soft.     Tenderness: There is no abdominal tenderness.  Musculoskeletal:     Right lower leg: No edema.     Left lower leg: No edema.  Skin:    General: Skin is warm and dry.  Neurological:     Mental Status: She is alert and oriented to person, place, and time.  Psychiatric:        Behavior: Behavior normal.     ED Results / Procedures / Treatments   Labs (all labs ordered are listed, but only abnormal results are displayed) Labs Reviewed  BASIC METABOLIC PANEL - Abnormal; Notable for the following components:      Result Value   Sodium 123 (*)    Potassium 5.8 (*)    Chloride 90 (*)    CO2 9 (*)    Glucose, Bld 1,153 (*)    BUN 23 (*)    Creatinine, Ser 1.83 (*)    GFR, Estimated 33 (*)    Anion gap 24 (*)    All other components within normal limits  CBC - Abnormal; Notable for the following components:   WBC 13.2 (*)    RBC 5.18 (*)    HCT 49.2 (*)    MCHC 29.9 (*)    Platelets 439 (*)    All other components within normal limits  BLOOD GAS, VENOUS - Abnormal; Notable for the following components:   pH, Ven 7.08 (*)    pCO2, Ven 31 (*)    pO2, Ven 69 (*)    Bicarbonate 9.2 (*)    Acid-base deficit 19.7 (*)    All other components within normal limits  BETA-HYDROXYBUTYRIC ACID -  Abnormal; Notable for the following components:   Beta-Hydroxybutyric Acid 7.56 (*)    All other components within normal limits  CBG MONITORING, ED - Abnormal; Notable for the following components:   Glucose-Capillary >600 (*)    All other components within normal limits  CBG MONITORING, ED - Abnormal; Notable for the following components:   Glucose-Capillary >600 (*)    All other components within normal limits  CBG MONITORING, ED - Abnormal; Notable for the following components:   Glucose-Capillary >600 (*)    All other components within normal limits  URINALYSIS, ROUTINE W REFLEX MICROSCOPIC  BASIC METABOLIC PANEL  BASIC METABOLIC PANEL  BASIC METABOLIC PANEL  BASIC METABOLIC PANEL  BASIC METABOLIC PANEL  MAGNESIUM    EKG None  Radiology No results found.  Procedures .Critical Care  Performed by: Jeannie Fend, PA-C Authorized by: Jeannie Fend, PA-C   Critical care provider statement:    Critical care time (minutes):  30   Critical care was time spent personally by me on the following activities:  Development of treatment plan with patient or surrogate, discussions with consultants, evaluation of patient's response to treatment, examination of patient, ordering and review of laboratory studies, ordering and review of radiographic studies, ordering and performing treatments and interventions, pulse oximetry, re-evaluation of patient's condition and review of old charts     Medications Ordered in ED Medications  insulin regular, human (MYXREDLIN) 100 units/ 100 mL infusion (13 Units/hr Intravenous New Bag/Given 04/30/23 0502)  lactated ringers infusion (has no administration in time range)  dextrose 5 % in lactated ringers infusion (has no administration in time range)  dextrose 50 % solution 0-50 mL (has no administration in  time range)  acetaminophen (TYLENOL) tablet 650 mg (has no administration in time range)    Or  acetaminophen (TYLENOL) suppository 650 mg  (has no administration in time range)  ondansetron (ZOFRAN) injection 4 mg (has no administration in time range)  sodium chloride 0.9 % bolus 1,000 mL (1,000 mLs Intravenous New Bag/Given 04/30/23 0350)  lactated ringers bolus 1,000 mL (1,000 mLs Intravenous New Bag/Given 04/30/23 0501)    ED Course/ Medical Decision Making/ A&P                             Medical Decision Making Amount and/or Complexity of Data Reviewed Labs: ordered.  Risk Prescription drug management. Decision regarding hospitalization.   This patient presents to the ED for concern of hypoglycemia, vomiting, out of medications, this involves an extensive number of treatment options, and is a complaint that carries with it a high risk of complications and morbidity.  The differential diagnosis includes but not limited to DKA, metabolic disturbance, gastritis   Co morbidities that complicate the patient evaluation  DM, depression anxiety, asthma, GERD, hyperlipidemia    Additional history obtained:  External records from outside source obtained and reviewed including prior labs on file   Lab Tests:  I Ordered, and personally interpreted labs.  The pertinent results include: VBG with pH of 7.08.  BMP with hyperglycemia, glucose is 1000 153, creatinine 1.83, gap of 24, bicarb of 9, potassium 5.8 with sodium of 128.  It hydroxybutyric acid elevated at 7.56.  CBC with nonspecific leukocytosis with white count of 13.2.   Cardiac Monitoring: / EKG:  The patient was maintained on a cardiac monitor.  I personally viewed and interpreted the cardiac monitored which showed an underlying rhythm of: Sinus tachycardia, rate 132   Consultations Obtained:  I requested consultation with the hospitalist, Dr. Arlean Hopping,  and discussed lab and imaging findings as well as pertinent plan - they recommend: admission ER attending, Dr. Nicanor Alcon, agrees with plan of care   Problem List / ED Course / Critical interventions /  Medication management  52 year old female with history of diabetes, prior DKA, out of insulin, vomiting. Aware and oriented, tachycardic, mucous membranes dry. Labs consistent with DKA, provided with IVF and insulin, admitted to hospitalist service.  I ordered medication including insulin, IVF  for hyperglycemia, dehydration  Reevaluation of the patient after these medicines showed that the patient stayed the same I have reviewed the patients home medicines and have made adjustments as needed   Social Determinants of Health:  Out of insulin   Test / Admission - Considered:  admit         Final Clinical Impression(s) / ED Diagnoses Final diagnoses:  Diabetic ketoacidosis without coma associated with other specified diabetes mellitus (HCC)  AKI (acute kidney injury) [facility])    Rx / DC Orders ED Discharge Orders     None         Jeannie Fend, PA-C 04/30/23 0548    Palumbo, April, MD 04/30/23 [id number]

## 2023-04-30 NOTE — ED Triage Notes (Signed)
Patient arrived stating she is concerned her blood sugar is high, stating she's had NV and been thirsty. Reports being out of her medication since Friday.

## 2023-04-30 NOTE — Inpatient Diabetes Management (Signed)
Inpatient Diabetes Program Recommendations  AACE/ADA: New Consensus Statement on Inpatient Glycemic Control (2015)  Target Ranges:  Prepandial:   less than 140 mg/dL      Peak postprandial:   less than 180 mg/dL (1-2 hours)      Critically ill patients:  140 - 180 mg/dL   Lab Results  Component Value Date   GLUCAP 244 (H) 04/30/2023   HGBA1C 13.0 (H) 04/30/2023    Review of Glycemic Control  Latest Reference Range & Units 04/30/23 11:53 04/30/23 12:51 04/30/23 13:52 04/30/23 14:51  Glucose-Capillary 70 - 99 mg/dL 161 (H) 096 (H) 045 (H) 244 (H)  (H): Data is abnormally high Diabetes history: Type 2 DM Outpatient Diabetes medications: Novolog 30 units TID, Lantus 54 units BID, Metformin 500 mg BID Current orders for Inpatient glycemic control: IV insulin  Inpatient Diabetes Program Recommendations:    When ready to transition consider: -Semglee 40 units two hours prior to discontinuation, then BID to follow -Novolog 12 units TID (assuming patient consuming >50% of meals) -Novolog 0-20 units TID & Hs  Spoke with patient regarding outpatient diabetes management. Patient states last dose of insulin was last Friday and this occurs pretty often when its time to renew meds. Admits to missed doses.  Reviewed patient's current A1c of 13.0% Explained what a A1c is and what it measures. Also reviewed goal A1c with patient, importance of good glucose control @ home, and blood sugar goals. Reviewed patho of DM, role of pancreas, DKA, hypo vs hyper glycemia, impact of missed doses, need for improved control, vascular changes and commorbidities.  Patient has a meter and supplies. Insulin arriving today per patient. Reviewed recommended frequency and when to follow up with PCP.  Admits to drinking sugary beverages. Reviewed alternatives, plate method, impact of protein and importance of CHO mindfulness. No additional questions at this time.   Thanks, Lujean Rave, MSN, RNC-OB Diabetes  Coordinator [phone] (8a-5p)

## 2023-05-01 ENCOUNTER — Encounter (HOSPITAL_COMMUNITY): Payer: Self-pay | Admitting: Internal Medicine

## 2023-05-01 DIAGNOSIS — E1110 Type 2 diabetes mellitus with ketoacidosis without coma: Secondary | ICD-10-CM | POA: Diagnosis not present

## 2023-05-01 LAB — BETA-HYDROXYBUTYRIC ACID: Beta-Hydroxybutyric Acid: 0.58 mmol/L — ABNORMAL HIGH (ref 0.05–0.27)

## 2023-05-01 LAB — GLUCOSE, CAPILLARY
Glucose-Capillary: 112 mg/dL — ABNORMAL HIGH (ref 70–99)
Glucose-Capillary: 146 mg/dL — ABNORMAL HIGH (ref 70–99)
Glucose-Capillary: 208 mg/dL — ABNORMAL HIGH (ref 70–99)
Glucose-Capillary: 249 mg/dL — ABNORMAL HIGH (ref 70–99)
Glucose-Capillary: 278 mg/dL — ABNORMAL HIGH (ref 70–99)
Glucose-Capillary: 278 mg/dL — ABNORMAL HIGH (ref 70–99)
Glucose-Capillary: 297 mg/dL — ABNORMAL HIGH (ref 70–99)
Glucose-Capillary: 374 mg/dL — ABNORMAL HIGH (ref 70–99)
Glucose-Capillary: 410 mg/dL — ABNORMAL HIGH (ref 70–99)
Glucose-Capillary: 433 mg/dL — ABNORMAL HIGH (ref 70–99)

## 2023-05-01 LAB — MAGNESIUM: Magnesium: 2.1 mg/dL (ref 1.7–2.4)

## 2023-05-01 LAB — CBC
HCT: 41.1 % (ref 36.0–46.0)
Hemoglobin: 13.4 g/dL (ref 12.0–15.0)
MCH: 28.6 pg (ref 26.0–34.0)
MCHC: 32.6 g/dL (ref 30.0–36.0)
MCV: 87.6 fL (ref 80.0–100.0)
Platelets: 339 10*3/uL (ref 150–400)
RBC: 4.69 MIL/uL (ref 3.87–5.11)
RDW: 14.6 % (ref 11.5–15.5)
WBC: 8.9 10*3/uL (ref 4.0–10.5)
nRBC: 0 % (ref 0.0–0.2)

## 2023-05-01 LAB — BASIC METABOLIC PANEL
Anion gap: 6 (ref 5–15)
BUN: 18 mg/dL (ref 6–20)
CO2: 24 mmol/L (ref 22–32)
Calcium: 9 mg/dL (ref 8.9–10.3)
Chloride: 105 mmol/L (ref 98–111)
Creatinine, Ser: 1.22 mg/dL — ABNORMAL HIGH (ref 0.44–1.00)
GFR, Estimated: 53 mL/min — ABNORMAL LOW (ref >60)
Glucose, Bld: 338 mg/dL — ABNORMAL HIGH (ref 70–99)
Potassium: 3.6 mmol/L (ref 3.5–5.1)
Sodium: 135 mmol/L (ref 135–145)

## 2023-05-01 MED ORDER — METOPROLOL TARTRATE 50 MG PO TABS
50.0000 mg | ORAL_TABLET | Freq: Two times a day (BID) | ORAL | Status: DC
  Administered 2023-05-01 – 2023-05-03 (×4): 50 mg via ORAL
  Filled 2023-05-01 (×4): qty 1

## 2023-05-01 MED ORDER — INSULIN ASPART 100 UNIT/ML IJ SOLN
20.0000 [IU] | Freq: Three times a day (TID) | INTRAMUSCULAR | Status: DC
  Administered 2023-05-01: 10 [IU] via SUBCUTANEOUS
  Administered 2023-05-02 (×3): 20 [IU] via SUBCUTANEOUS

## 2023-05-01 MED ORDER — ATORVASTATIN CALCIUM 40 MG PO TABS
40.0000 mg | ORAL_TABLET | Freq: Every day | ORAL | Status: DC
  Administered 2023-05-01 – 2023-05-03 (×3): 40 mg via ORAL
  Filled 2023-05-01 (×3): qty 1

## 2023-05-01 MED ORDER — ALUM & MAG HYDROXIDE-SIMETH 200-200-20 MG/5ML PO SUSP: 15.0000 mL | Freq: Four times a day (QID) | ORAL | Status: DC | PRN

## 2023-05-01 MED ORDER — INSULIN ASPART 100 UNIT/ML IJ SOLN
10.0000 [IU] | Freq: Once | INTRAMUSCULAR | Status: AC
  Administered 2023-05-01: 10 [IU] via SUBCUTANEOUS

## 2023-05-01 MED ORDER — INSULIN ASPART 100 UNIT/ML IJ SOLN
10.0000 [IU] | INTRAMUSCULAR | Status: AC
  Administered 2023-05-01: 10 [IU] via SUBCUTANEOUS

## 2023-05-01 MED ORDER — PANTOPRAZOLE SODIUM 40 MG PO TBEC
40.0000 mg | DELAYED_RELEASE_TABLET | Freq: Every day | ORAL | Status: DC
  Administered 2023-05-01 – 2023-05-03 (×3): 40 mg via ORAL
  Filled 2023-05-01 (×3): qty 1

## 2023-05-01 NOTE — Inpatient Diabetes Management (Signed)
Inpatient Diabetes Program Recommendations  AACE/ADA: New Consensus Statement on Inpatient Glycemic Control (2015)  Target Ranges:  Prepandial:   less than 140 mg/dL      Peak postprandial:   less than 180 mg/dL (1-2 hours)      Critically ill patients:  140 - 180 mg/dL   Lab Results  Component Value Date   GLUCAP 433 (H) 05/01/2023   HGBA1C 13.0 (H) 04/30/2023    Review of Glycemic Control  Latest Reference Range & Units 04/30/23 20:04 04/30/23 20:50 05/01/23 00:22 05/01/23 08:36  Glucose-Capillary 70 - 99 mg/dL 474 (H) 259 (H) 563 (H) 433 (H)  (H): Data is abnormally high Diabetes history: Type 2 DM Outpatient Diabetes medications: Novolog 30 units TID, Lantus 54 units BID, Metformin 500 mg BID Current orders for Inpatient glycemic control: IV insulin to transition to Semglee 40 units BID, Novolog 12 units TID, Novolog 0-20 units TID & HS   Inpatient Diabetes Program Recommendations:    Blood sugars elevated post transition, however patient only received partial  basal dosing.   consider: -Novolog 20 units TID (assuming patient consuming >50% of meals)  Thanks, Lujean Rave, MSN, RNC-OB Diabetes Coordinator [phone] (8a-5p)

## 2023-05-01 NOTE — TOC Initial Note (Signed)
Transition of Care [facility]) - Initial/Assessment Note    Patient Details  Name: Whitney Benton MRN: [id number] Date of Birth: 07/29/1971  [facility]) CM/SW Contact:    Durenda Guthrie, RN Phone Number: 05/01/2023, 2:07 PM  Clinical Narrative:                  Transition of Care [facility]) Department has reviewed patient and no TOC needs have been identified at this time. We will continue to monitor patient advancement through Interdisciplinary progressions and if new patient needs arise, please place a consult.       Patient Goals and CMS Choice            Expected Discharge Plan and Services                                              Prior Living Arrangements/Services                       Activities of Daily Living Home Assistive Devices/Equipment: Insulin Pump, Eyeglasses, CBG Meter ADL Screening (condition at time of admission) Patient's cognitive ability adequate to safely complete daily activities?: Yes Is the patient deaf or have difficulty hearing?: No Does the patient have difficulty seeing, even when wearing glasses/contacts?: No Does the patient have difficulty concentrating, remembering, or making decisions?: No Patient able to express need for assistance with ADLs?: Yes Does the patient have difficulty dressing or bathing?: No Independently performs ADLs?: Yes (appropriate for developmental age) Does the patient have difficulty walking or climbing stairs?: No Weakness of Legs: None Weakness of Arms/Hands: None  Permission Sought/Granted                  Emotional Assessment              Admission diagnosis:  DKA (diabetic ketoacidosis) (HCC) [E11.10] AKI (acute kidney injury) (HCC) [N17.9] Diabetic ketoacidosis without coma associated with other specified diabetes mellitus (HCC) [E13.10] Patient Active Problem List   Diagnosis Date Noted   Hyperlipidemia associated with type 2 diabetes mellitus (HCC) 03/06/2023    Severe recurrent major depressive disorder with psychotic features (HCC) 12/27/2022   Primary insomnia 12/27/2022   Morbid obesity (HCC) 04/04/2022   Former smoker 04/30/2021   Major depressive disorder, recurrent episode, moderate (HCC) 04/30/2021   Substance induced mood disorder (HCC) 03/22/2021   Generalized anxiety disorder 03/22/2021   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   DKA (diabetic ketoacidosis) (HCC) 01/23/2021   Glaucoma suspect 04/12/2020   DKA, type 2, not at goal [facility]) 10/02/2019   AKI (acute kidney injury) (HCC) 10/02/2019   Hyperkalemia 10/02/2019   Leukocytosis 10/02/2019   Abnormal LFTs 10/02/2019   Stressful life events affecting family and household 08/06/2019   New onset type 2 diabetes mellitus (HCC) 02/04/2019   Morbid obesity with BMI of 40.0-44.9, adult (HCC) 02/04/2019   Tobacco dependence 02/04/2019   Left arm weakness 12/06/2014   Paresthesias/numbness 12/06/2014   Tobacco abuse 11/14/2014   Depression    Mixed incontinence 06/27/2014   History of TVH in 2006 for fibroids and menorrhagia; benign pathology 09/08/2011   PCP:  Marcine Matar, MD Pharmacy:   [id number] - [place]), [place] - [address] [place] ([place]) [place] [address] Phone: [phone] Fax: [phone]  [facility] [phone] -  [place], [place] - [address] AT [facility] [address]  [place] [address] Phone: [phone] Fax: [phone]     Social Determinants of Health (SDOH) Social History: SDOH Screenings   Food Insecurity: No Food Insecurity (04/30/2023)  Housing: Low Risk  (04/30/2023)  Transportation Needs: No Transportation Needs (04/30/2023)  Utilities: Not At Risk (04/30/2023)  Depression (PHQ2-9): High Risk (04/15/2023)  Tobacco Use: Medium Risk (05/01/2023)   SDOH Interventions:     Readmission Risk Interventions     No data to display

## 2023-05-01 NOTE — Progress Notes (Signed)
PROGRESS NOTE    Whitney Benton  RUE:[id number] DOB: 22-Mar-1971 DOA: 04/30/2023 PCP: Marcine Matar, MD    Brief Narrative:   Whitney Benton is a 52 y.o. female with past medical history significant for type 2 diabetes mellitus, history of previous DKA, HTN, CAD, HLD, history of brain headache, anxiety/depression, generalized anxiety disorder, obesity who presented to [facility] ED on 5/8 with complaints of nausea, vomiting and concerned that her blood sugar is high.  Patient reports that she utilizes a insulin pump but has been out of her insulin since Friday.   In the ED, temperature 98.9 F, HR 135, RR 24, BP 147/103, SpO2 95% on room air.  WBC 13.2, hemoglobin 14.7, platelets 439.  Sodium 123, potassium 5.8, chloride 90, CO2 9, glucose 1153, BUN 23, creatinine 1.83.  Beta hydroxybutyrate acid 7.56.  VBG with pH 7.08, pCO2 31, pCO2 69.  Was placed on insulin drip.  TRH consulted for admission for further evaluation management of DKA in the setting of noncompliance with home insulin.  Assessment & Plan:   Diabetic ketoacidosis Type II Diabetes Mellitus Patient presenting with nausea/vomiting, increased thirst with elevated glucose.  On arrival, patient's glucose elevated 1153 with anion gap of 24 and acidosis noted on VBG.  Patient reports has been out of her home insulin since Friday prior to admission.  Patient was initially placed on insulin drip and supported with IV fluid hydration with subsequent closure of anion gap x 2 and now transitioned back to subcutaneous insulin. -- Diabetic educator following, appreciate assistance -- Hemoglobin A1c 13.0, correlating with very poor control over the last 3 months -- Semglee 40 u [place] BID -- Novolog 12 u TIDAC -- Resistant insulin sliding scale for coverage -- CBG qAC/HS -- Continue to closely monitor glucose trends, additional insulin needs and will continue adjust long-acting and mealtime insulin   Essential hypertension --  Amlodipine 10 mg p.o. daily -- Increase Metoprolol tartrate to 50 mg p.o. twice daily -- Hydralazine 25 mg p.o. q6h PRN SBP >>165 -- Labetalol 5 mg IV every 2 hours as needed SBP >165; hold for HR <60   Generalized anxiety disorder Depression -- Awaiting pharmacy reconciliation before restarting   Hyperlipidemia -- Atorvastatin 40 mg p.o. daily   GERD -- Protonix 40 mg p.o. daily   Morbid Obesity Body mass index is 40.3 kg/m.  Discussed with patient needs for aggressive lifestyle changes/weight loss as this complicates all facets of care.  Outpatient follow-up with PCP.     DVT prophylaxis: SCDs Start: 04/30/23 0526    Code Status: Full Code Family Communication: No family present at bedside this morning  Disposition Plan:  Level of care: Med-Surg Status is: Inpatient Remains inpatient appropriate because: Needs further adjustment in control of her diabetes before stable for discharge home, anticipate 1-2 days    Consultants:  None  Procedures:  None  Antimicrobials:  None   Subjective: Patient seen examined bedside, resting comfortably.  Lying in bed.  Complaining of some epigastric pain, worse after having meals.  Denies nausea/vomiting.  Transitioned off of insulin drip last night to subcutaneous insulin.  Discussed with patient if she desires to return to her insulin pump or utilize pens on discharge, currently states would like to go back to the pens.  Glucose remains poorly controlled, in the 300s this morning.  Will need further adjustment before stable for discharge home.  No other questions or concerns at this time.  Denies headache, no dizziness, no  chest pain, no palpitations, no shortness of breath, no fever/chills/night sweats, no nausea/vomiting/diarrhea, no focal weakness, no fatigue, no paresthesias.  No acute events overnight per nursing staff.  Stable for transfer to med/surg floor  Objective: Vitals:   05/01/23 0800 05/01/23 0900 05/01/23 0941 05/01/23  1000  BP: (!) 148/88 (!) 159/83 127/78 (!) 141/84  Pulse: 100 (!) 109 (!) 107 (!) 105  Resp: 18 (!) 31  20  Temp: 98.6 F (37 C)     TempSrc: Oral     SpO2: 98% 90%  94%  Weight:      Height:        Intake/Output Summary (Last 24 hours) at 05/01/2023 1025 Last data filed at 05/01/2023 0656 Gross per 24 hour  Intake 3109.77 ml  Output --  Net 3109.77 ml   Filed Weights   04/30/23 0736 05/01/23 0500  Weight: 103.2 kg 105.2 kg    Examination:  Physical Exam: GEN: NAD, alert and oriented x 3, obese HEENT: NCAT, PERRL, EOMI, sclera clear, MMM PULM: CTAB w/o wheezes/crackles, normal respiratory effort, on 2 L nasal cannula with SpO2 95% at rest CV: RRR w/o M/G/R GI: abd soft, NTND, NABS, no R/G/M MSK: no peripheral edema, muscle strength globally intact 5/5 bilateral upper/lower extremities NEURO: CN II-XII intact, no focal deficits, sensation to light touch intact PSYCH: Depressed mood, flat affect Integumentary: dry/intact, no rashes or wounds    Data Reviewed: I have personally reviewed following labs and imaging studies  CBC: Recent Labs  Lab 04/30/23 0334 05/01/23 0507  WBC 13.2* 8.9  HGB 14.7 13.4  HCT 49.2* 41.1  MCV 95.0 87.6  PLT 439* 339   Basic Metabolic Panel: Recent Labs  Lab 04/30/23 0334 04/30/23 0754 04/30/23 1154 04/30/23 1626 04/30/23 2110 05/01/23 0507  NA 123* 132* 136 138 136 135  K 5.8* 4.2 4.0 3.7 3.7 3.6  CL 90* 102 110 109 107 105  CO2 9* 12* 18* 21* 20* 24  GLUCOSE 1,153* 570* 280* 186* 271* 338*  BUN 23* 22* 25* 22* 19 18  CREATININE 1.83* 1.64* 1.39* 1.21* 1.25* 1.22*  CALCIUM 9.4 9.4 9.9 9.7 9.2 9.0  MG 2.4  --   --   --   --  2.1   GFR: Estimated Creatinine Clearance: 62.6 mL/min (A) (by C-G formula based on SCr of 1.22 mg/dL (H)). Liver Function Tests: No results for input(s): "AST", "ALT", "ALKPHOS", "BILITOT", "PROT", "ALBUMIN" in the last 168 hours. No results for input(s): "LIPASE", "AMYLASE" in the last 168  hours. No results for input(s): "AMMONIA" in the last 168 hours. Coagulation Profile: No results for input(s): "INR", "PROTIME" in the last 168 hours. Cardiac Enzymes: No results for input(s): "CKTOTAL", "CKMB", "CKMBINDEX", "TROPONINI" in the last 168 hours. BNP (last 3 results) No results for input(s): "PROBNP" in the last 8760 hours. HbA1C: Recent Labs    04/30/23 0754  HGBA1C 13.0*   CBG: Recent Labs  Lab 04/30/23 2004 04/30/23 2050 05/01/23 0022 05/01/23 0836 05/01/23 0933  GLUCAP 307* 260* 374* 433* 410*   Lipid Profile: No results for input(s): "CHOL", "HDL", "LDLCALC", "TRIG", "CHOLHDL", "LDLDIRECT" in the last 72 hours. Thyroid Function Tests: No results for input(s): "TSH", "T4TOTAL", "FREET4", "T3FREE", "THYROIDAB" in the last 72 hours. Anemia Panel: No results for input(s): "VITAMINB12", "FOLATE", "FERRITIN", "TIBC", "IRON", "RETICCTPCT" in the last 72 hours. Sepsis Labs: No results for input(s): "PROCALCITON", "LATICACIDVEN" in the last 168 hours.  Recent Results (from the past 240 hour(s))  MRSA Next Gen by  PCR, Nasal     Status: Abnormal   Collection Time: 04/30/23  5:12 PM   Specimen: Nasal Mucosa; Nasal Swab  Result Value Ref Range Status   MRSA by PCR Next Gen DETECTED (A) NOT DETECTED Final    Comment: (NOTE) The GeneXpert MRSA Assay (FDA approved for NASAL specimens only), is one component of a comprehensive MRSA colonization surveillance program. It is not intended to diagnose MRSA infection nor to guide or monitor treatment for MRSA infections. Test performance is not FDA approved in patients less than 72 years old. Performed at [facility], [address], [place], [place] [address]          Radiology Studies: No results found.      Scheduled Meds:  amLODipine  10 mg Oral Daily   Chlorhexidine Gluconate Cloth  6 each Topical Daily   Chlorhexidine Gluconate Cloth  6 each Topical Q0600   insulin aspart  0-20  Units Subcutaneous TID WC   insulin aspart  0-5 Units Subcutaneous QHS   insulin aspart  12 Units Subcutaneous TID WC   insulin glargine-yfgn  40 Units Subcutaneous BID   metoprolol tartrate  25 mg Oral BID   mupirocin ointment  1 Application Nasal BID   pantoprazole  40 mg Oral Daily   Continuous Infusions:   LOS: 1 day    Time spent: 53 minutes spent on chart review, discussion with nursing staff, consultants, updating family and interview/physical exam; more than 50% of that time was spent in counseling and/or coordination of care.    Alvira Philips [place], DO [facility] Available via Epic secure chat 7am-7pm After these hours, please refer to coverage provider listed on [email or web address] 05/01/2023, 10:25 AM

## 2023-05-02 DIAGNOSIS — E1110 Type 2 diabetes mellitus with ketoacidosis without coma: Secondary | ICD-10-CM | POA: Diagnosis not present

## 2023-05-02 LAB — BASIC METABOLIC PANEL
Anion gap: 10 (ref 5–15)
BUN: 17 mg/dL (ref 6–20)
CO2: 26 mmol/L (ref 22–32)
Calcium: 9 mg/dL (ref 8.9–10.3)
Chloride: 102 mmol/L (ref 98–111)
Creatinine, Ser: 1.07 mg/dL — ABNORMAL HIGH (ref 0.44–1.00)
GFR, Estimated: >60 mL/min (ref >60)
Glucose, Bld: 329 mg/dL — ABNORMAL HIGH (ref 70–99)
Potassium: 3.1 mmol/L — ABNORMAL LOW (ref 3.5–5.1)
Sodium: 138 mmol/L (ref 135–145)

## 2023-05-02 LAB — GLUCOSE, CAPILLARY
Glucose-Capillary: 331 mg/dL — ABNORMAL HIGH (ref 70–99)
Glucose-Capillary: 169 mg/dL — ABNORMAL HIGH (ref 70–99)
Glucose-Capillary: 219 mg/dL — ABNORMAL HIGH (ref 70–99)
Glucose-Capillary: 258 mg/dL — ABNORMAL HIGH (ref 70–99)

## 2023-05-02 MED ORDER — DULOXETINE HCL 30 MG PO CPEP
60.0000 mg | ORAL_CAPSULE | Freq: Every day | ORAL | Status: DC
  Administered 2023-05-02: 60 mg via ORAL
  Filled 2023-05-02: qty 2

## 2023-05-02 MED ORDER — INSULIN GLARGINE-YFGN 100 UNIT/ML ~~LOC~~ SOLN
50.0000 [IU] | Freq: Two times a day (BID) | SUBCUTANEOUS | Status: DC
  Administered 2023-05-02 (×2): 50 [IU] via SUBCUTANEOUS
  Filled 2023-05-02 (×3): qty 0.5

## 2023-05-02 MED ORDER — OLANZAPINE 5 MG PO TABS
15.0000 mg | ORAL_TABLET | Freq: Every day | ORAL | Status: DC
  Administered 2023-05-02: 15 mg via ORAL
  Filled 2023-05-02: qty 1

## 2023-05-02 MED ORDER — DOXEPIN HCL 25 MG PO CAPS
25.0000 mg | ORAL_CAPSULE | Freq: Every day | ORAL | Status: DC
  Administered 2023-05-02: 25 mg via ORAL
  Filled 2023-05-02: qty 1

## 2023-05-02 MED ORDER — POTASSIUM CHLORIDE CRYS ER 20 MEQ PO TBCR
40.0000 meq | EXTENDED_RELEASE_TABLET | ORAL | Status: AC
  Administered 2023-05-02 (×2): 40 meq via ORAL
  Filled 2023-05-02 (×2): qty 2

## 2023-05-02 MED ORDER — DULOXETINE HCL 30 MG PO CPEP
80.0000 mg | ORAL_CAPSULE | Freq: Every day | ORAL | Status: DC
  Administered 2023-05-03: 80 mg via ORAL
  Filled 2023-05-02: qty 1

## 2023-05-02 MED ORDER — BUSPIRONE HCL 5 MG PO TABS
15.0000 mg | ORAL_TABLET | Freq: Two times a day (BID) | ORAL | Status: DC
  Administered 2023-05-02 – 2023-05-03 (×3): 15 mg via ORAL
  Filled 2023-05-02 (×3): qty 1

## 2023-05-02 NOTE — Inpatient Diabetes Management (Signed)
Inpatient Diabetes Program Recommendations  AACE/ADA: New Consensus Statement on Inpatient Glycemic Control (2015)  Target Ranges:  Prepandial:   less than 140 mg/dL      Peak postprandial:   less than 180 mg/dL (1-2 hours)      Critically ill patients:  140 - 180 mg/dL    Latest Reference Range & Units 05/01/23 09:33 05/01/23 10:58 05/01/23 12:14 05/01/23 15:31 05/01/23 16:18 05/01/23 17:50 05/01/23 18:43 05/01/23 21:06  Glucose-Capillary 70 - 99 mg/dL 562 (H)  32 units Novolog  40 units Semglee @0942   278 (H)  10 units Novolog @1118  278 (H)  23 units Novolog  112 (H) 146 (H)  3 units Novolog  208 (H) 297 (H)  10 units Novolog  249 (H)  2 units Novolog  40 units Semglee  (H): Data is abnormally high  Latest Reference Range & Units 05/02/23 07:26  Glucose-Capillary 70 - 99 mg/dL 130 (H)  (H): Data is abnormally high   Diabetes history: Type 2 DM  Outpatient DM Meds: Novolog 30 units TID Lantus 54 units BID, Metformin 500 mg BID  Current orders: Semglee 40 units BID   Novolog 20 units TID   Novolog 0-20 units TID & HS      MD- Note CBG 331 this AM.    Also note Novolog Meal Coverage increased to 20 units TID last PM  Please consider increasing the Semglee further to 50 units BID (Home dose Lantus listed as 54 units BID)    --Will follow patient during hospitalization--  Ambrose Finland RN, MSN, CDCES Diabetes Coordinator Inpatient Glycemic Control Team Team Pager: [phone] (8a-5p)

## 2023-05-02 NOTE — Progress Notes (Addendum)
PROGRESS NOTE    Whitney Benton  BJY:[id number] DOB: 02-13-1971 DOA: 04/30/2023 PCP: Marcine Matar, MD    Brief Narrative:   Whitney Benton is a 52 y.o. female with past medical history significant for type 2 diabetes mellitus, history of previous DKA, HTN, CAD, HLD, history of brain headache, anxiety/depression, generalized anxiety disorder, obesity who presented to [facility] ED on 5/8 with complaints of nausea, vomiting and concerned that her blood sugar is high.  Patient reports that she utilizes a insulin pump but has been out of her insulin since Friday.   In the ED, temperature 98.9 F, HR 135, RR 24, BP 147/103, SpO2 95% on room air.  WBC 13.2, hemoglobin 14.7, platelets 439.  Sodium 123, potassium 5.8, chloride 90, CO2 9, glucose 1153, BUN 23, creatinine 1.83.  Beta hydroxybutyrate acid 7.56.  VBG with pH 7.08, pCO2 31, pCO2 69.  Was placed on insulin drip.  TRH consulted for admission for further evaluation management of DKA in the setting of noncompliance with home insulin.  Assessment & Plan:   Diabetic ketoacidosis Type II Diabetes Mellitus Patient presenting with nausea/vomiting, increased thirst with elevated glucose.  On arrival, patient's glucose elevated 1153 with anion gap of 24 and acidosis noted on VBG.  Patient reports has been out of her home insulin since Friday prior to admission.  Patient was initially placed on insulin drip and supported with IV fluid hydration with subsequent closure of anion gap x 2 and now transitioned back to subcutaneous insulin. -- Diabetic educator following, appreciate assistance -- Hemoglobin A1c 13.0, correlating with very poor control over the last 3 months -- Increase Semglee to 50 u [place] BID -- Novolog 20 u TIDAC -- Resistant insulin sliding scale for coverage -- CBG qAC/HS -- Continue to closely monitor glucose trends, additional insulin needs and will continue adjust long-acting and mealtime insulin  Hypokalemia Potassium  3.1, will replete. -- Repeat electrolytes in a.m. to include magnesium   Essential hypertension -- Amlodipine 10 mg p.o. daily -- Metoprolol tartrate 50 mg p.o. twice daily -- Hydralazine 25 mg p.o. q6h PRN SBP >>165 -- Labetalol 5 mg IV every 2 hours as needed SBP >165; hold for HR <60   Generalized anxiety disorder Depression -- BuSpar 15 mg p.o. twice daily -- Cymbalta 60 mg p.o. daily -- Zyprexa 15 mg p.o. nightly -- Doxepin 25 mg p.o. nightly   Hyperlipidemia -- Atorvastatin 40 mg p.o. daily   GERD -- Protonix 40 mg p.o. daily   Morbid Obesity Body mass index is 40.3 kg/m.  Discussed with patient needs for aggressive lifestyle changes/weight loss as this complicates all facets of care.  Outpatient follow-up with PCP.     DVT prophylaxis: SCDs Start: 04/30/23 0526    Code Status: Full Code Family Communication: No family present at bedside this morning  Disposition Plan:  Level of care: Med-Surg Status is: Inpatient Remains inpatient appropriate because: Needs further adjustment in control of her diabetes before stable for discharge home, anticipate 1-2 days    Consultants:  None  Procedures:  None  Antimicrobials:  None   Subjective: Patient seen examined bedside, resting comfortably.  Lying in bed.  Continues to complain of some epigastric pain likely a combination of GERD and poorly controlled diabetes with gastroparesis.  Blood sugars remain poorly controlled, uptitrating Semglee today. Will need further monitoring and adjustments before stable for discharge home.  No other questions or concerns at this time.  Denies headache, no dizziness, no  chest pain, no palpitations, no shortness of breath, no fever/chills/night sweats, no nausea/vomiting/diarrhea, no focal weakness, no fatigue, no paresthesias.  No acute events overnight per nursing staff.    Objective: Vitals:   05/01/23 1100 05/01/23 1241 05/01/23 2104 05/02/23 0525  BP: (!) 145/90 111/73 (!)  150/98 (!) 131/91  Pulse: (!) 102 88 (!) 105 86  Resp: 19 20 19 17   Temp:  98.4 F (36.9 C) 99.1 F (37.3 C) 97.7 F (36.5 C)  TempSrc:  Oral    SpO2: 94% 96% 95% 97%  Weight:    102.9 kg  Height:        Intake/Output Summary (Last 24 hours) at 05/02/2023 1034 Last data filed at 05/01/2023 1712 Gross per 24 hour  Intake 236 ml  Output --  Net 236 ml   Filed Weights   04/30/23 0736 05/01/23 0500 05/02/23 0525  Weight: 103.2 kg 105.2 kg 102.9 kg    Examination:  Physical Exam: GEN: NAD, alert and oriented x 3, obese HEENT: NCAT, PERRL, EOMI, sclera clear, MMM PULM: CTAB w/o wheezes/crackles, normal respiratory effort, on 2 L nasal cannula with SpO2 95% at rest CV: RRR w/o M/G/R GI: abd soft, NTND, NABS, no R/G/M MSK: no peripheral edema, muscle strength globally intact 5/5 bilateral upper/lower extremities NEURO: CN II-XII intact, no focal deficits, sensation to light touch intact PSYCH: Depressed mood, flat affect Integumentary: dry/intact, no rashes or wounds    Data Reviewed: I have personally reviewed following labs and imaging studies  CBC: Recent Labs  Lab 04/30/23 0334 05/01/23 0507  WBC 13.2* 8.9  HGB 14.7 13.4  HCT 49.2* 41.1  MCV 95.0 87.6  PLT 439* 339   Basic Metabolic Panel: Recent Labs  Lab 04/30/23 0334 04/30/23 0754 04/30/23 1154 04/30/23 1626 04/30/23 2110 05/01/23 0507 05/02/23 0552  NA 123*   < > 136 138 136 135 138  K 5.8*   < > 4.0 3.7 3.7 3.6 3.1*  CL 90*   < > 110 109 107 105 102  CO2 9*   < > 18* 21* 20* 24 26  GLUCOSE 1,153*   < > 280* 186* 271* 338* 329*  BUN 23*   < > 25* 22* 19 18 17   CREATININE 1.83*   < > 1.39* 1.21* 1.25* 1.22* 1.07*  CALCIUM 9.4   < > 9.9 9.7 9.2 9.0 9.0  MG 2.4  --   --   --   --  2.1  --    < > = values in this interval not displayed.   GFR: Estimated Creatinine Clearance: 70.5 mL/min (A) (by C-G formula based on SCr of 1.07 mg/dL (H)). Liver Function Tests: No results for input(s): "AST",  "ALT", "ALKPHOS", "BILITOT", "PROT", "ALBUMIN" in the last 168 hours. No results for input(s): "LIPASE", "AMYLASE" in the last 168 hours. No results for input(s): "AMMONIA" in the last 168 hours. Coagulation Profile: No results for input(s): "INR", "PROTIME" in the last 168 hours. Cardiac Enzymes: No results for input(s): "CKTOTAL", "CKMB", "CKMBINDEX", "TROPONINI" in the last 168 hours. BNP (last 3 results) No results for input(s): "PROBNP" in the last 8760 hours. HbA1C: Recent Labs    04/30/23 0754  HGBA1C 13.0*   CBG: Recent Labs  Lab 05/01/23 1618 05/01/23 1750 05/01/23 1843 05/01/23 2106 05/02/23 0726  GLUCAP 146* 208* 297* 249* 331*   Lipid Profile: No results for input(s): "CHOL", "HDL", "LDLCALC", "TRIG", "CHOLHDL", "LDLDIRECT" in the last 72 hours. Thyroid Function Tests: No results for input(s): "TSH", "  T4TOTAL", "FREET4", "T3FREE", "THYROIDAB" in the last 72 hours. Anemia Panel: No results for input(s): "VITAMINB12", "FOLATE", "FERRITIN", "TIBC", "IRON", "RETICCTPCT" in the last 72 hours. Sepsis Labs: No results for input(s): "PROCALCITON", "LATICACIDVEN" in the last 168 hours.  Recent Results (from the past 240 hour(s))  MRSA Next Gen by PCR, Nasal     Status: Abnormal   Collection Time: 04/30/23  5:12 PM   Specimen: Nasal Mucosa; Nasal Swab  Result Value Ref Range Status   MRSA by PCR Next Gen DETECTED (A) NOT DETECTED Final    Comment: (NOTE) The GeneXpert MRSA Assay (FDA approved for NASAL specimens only), is one component of a comprehensive MRSA colonization surveillance program. It is not intended to diagnose MRSA infection nor to guide or monitor treatment for MRSA infections. Test performance is not FDA approved in patients less than 82 years old. Performed at [facility], [address], [place], [place] [address]          Radiology Studies: No results found.      Scheduled Meds:  amLODipine  10 mg Oral Daily    atorvastatin  40 mg Oral Daily   insulin aspart  0-20 Units Subcutaneous TID WC   insulin aspart  0-5 Units Subcutaneous QHS   insulin aspart  20 Units Subcutaneous TID WC   insulin glargine-yfgn  50 Units Subcutaneous BID   metoprolol tartrate  50 mg Oral BID   mupirocin ointment  1 Application Nasal BID   pantoprazole  40 mg Oral Daily   potassium chloride  40 mEq Oral Q3H   Continuous Infusions:   LOS: 2 days    Time spent: 50 minutes spent on chart review, discussion with nursing staff, consultants, updating family and interview/physical exam; more than 50% of that time was spent in counseling and/or coordination of care.    Alvira Philips [place], DO [facility] Available via Epic secure chat 7am-7pm After these hours, please refer to coverage provider listed on [email or web address] 05/02/2023, 10:34 AM

## 2023-05-03 DIAGNOSIS — E1110 Type 2 diabetes mellitus with ketoacidosis without coma: Secondary | ICD-10-CM | POA: Diagnosis not present

## 2023-05-03 LAB — BASIC METABOLIC PANEL
Anion gap: 10 (ref 5–15)
BUN: 16 mg/dL (ref 6–20)
CO2: 25 mmol/L (ref 22–32)
Calcium: 9 mg/dL (ref 8.9–10.3)
Chloride: 104 mmol/L (ref 98–111)
Creatinine, Ser: 0.88 mg/dL (ref 0.44–1.00)
GFR, Estimated: >60 mL/min (ref >60)
Glucose, Bld: 188 mg/dL — ABNORMAL HIGH (ref 70–99)
Potassium: 3.2 mmol/L — ABNORMAL LOW (ref 3.5–5.1)
Sodium: 139 mmol/L (ref 135–145)

## 2023-05-03 LAB — GLUCOSE, CAPILLARY: Glucose-Capillary: 220 mg/dL — ABNORMAL HIGH (ref 70–99)

## 2023-05-03 LAB — MAGNESIUM: Magnesium: 1.8 mg/dL (ref 1.7–2.4)

## 2023-05-03 MED ORDER — INSULIN ASPART 100 UNIT/ML IJ SOLN
24.0000 [IU] | Freq: Three times a day (TID) | INTRAMUSCULAR | Status: DC
  Administered 2023-05-03: 24 [IU] via SUBCUTANEOUS

## 2023-05-03 MED ORDER — METOPROLOL TARTRATE 50 MG PO TABS: 50.0000 mg | ORAL_TABLET | Freq: Two times a day (BID) | ORAL | 0 refills | Status: DC

## 2023-05-03 MED ORDER — AMLODIPINE BESYLATE 10 MG PO TABS: 10.0000 mg | ORAL_TABLET | Freq: Every day | ORAL | 2 refills | Status: DC

## 2023-05-03 MED ORDER — INSULIN GLARGINE-YFGN 100 UNIT/ML ~~LOC~~ SOLN
55.0000 [IU] | Freq: Two times a day (BID) | SUBCUTANEOUS | Status: DC
  Administered 2023-05-03: 55 [IU] via SUBCUTANEOUS
  Filled 2023-05-03 (×2): qty 0.55

## 2023-05-03 MED ORDER — METFORMIN HCL ER 500 MG PO TB24: 500.0000 mg | ORAL_TABLET | Freq: Two times a day (BID) | ORAL | 0 refills | Status: DC

## 2023-05-03 MED ORDER — "PEN NEEDLES 3/16"" 31G X 5 MM MISC": 2 refills | Status: DC

## 2023-05-03 MED ORDER — MAGNESIUM SULFATE 2 GM/50ML IV SOLN
2.0000 g | Freq: Once | INTRAVENOUS | Status: AC
  Administered 2023-05-03: 2 g via INTRAVENOUS
  Filled 2023-05-03: qty 50

## 2023-05-03 MED ORDER — POTASSIUM CHLORIDE CRYS ER 20 MEQ PO TBCR
30.0000 meq | EXTENDED_RELEASE_TABLET | ORAL | Status: DC
  Administered 2023-05-03: 30 meq via ORAL
  Filled 2023-05-03: qty 1

## 2023-05-03 MED ORDER — LANTUS SOLOSTAR 100 UNIT/ML ~~LOC~~ SOPN
55.0000 [IU] | PEN_INJECTOR | Freq: Two times a day (BID) | SUBCUTANEOUS | 2 refills | Status: DC
Start: 2023-05-03 — End: 2023-05-07

## 2023-05-03 MED ORDER — NOVOLOG FLEXPEN 100 UNIT/ML ~~LOC~~ SOPN
30.0000 [IU] | PEN_INJECTOR | Freq: Three times a day (TID) | SUBCUTANEOUS | 3 refills | Status: DC
Start: 2023-05-03 — End: 2023-05-07

## 2023-05-03 NOTE — Discharge Summary (Signed)
Physician Discharge Summary  Whitney Benton ZOX:[id number] DOB: 1971/01/13 DOA: 04/30/2023  PCP: Whitney Matar, MD  Admit date: 04/30/2023 Discharge date: 05/03/2023  Admitted From: Home Disposition: Home  Recommendations for Outpatient Follow-up:  Follow up with PCP in 1-2 weeks Discharged on Lantus 55 units [place] twice daily, NovoLog 30 [place] TIDAC Metoprolol tartrate increased to 50 mg p.o. twice daily and amlodipine increased to 10 mg p.o. daily for better control of her blood pressure Continue to monitor glucose and anticipate likely need of further adjustments outpatient. Continue to encourage compliance with her insulin regimen and refilling her medications on a timely fashion  Home Health: No Equipment/Devices: None  Discharge Condition: Stable CODE STATUS: Full code Diet recommendation: Heart healthy/consistent carbohydrate diet  History of present illness:  Whitney Benton is a 52 y.o. female with past medical history significant for type 2 diabetes mellitus, history of previous DKA, HTN, CAD, HLD, history of brain headache, anxiety/depression, generalized anxiety disorder, obesity who presented to [facility] ED on 5/8 with complaints of nausea, vomiting and concerned that her blood sugar is high.  Patient reports that she utilizes a insulin pump but has been out of her insulin since Friday.   In the ED, temperature 98.9 F, HR 135, RR 24, BP 147/103, SpO2 95% on room air.  WBC 13.2, hemoglobin 14.7, platelets 439.  Sodium 123, potassium 5.8, chloride 90, CO2 9, glucose 1153, BUN 23, creatinine 1.83.  Beta hydroxybutyrate acid 7.56.  VBG with pH 7.08, pCO2 31, pCO2 69.  Was placed on insulin drip.  TRH consulted for admission for further evaluation management of DKA in the setting of noncompliance with home insulin.  Hospital course:  Diabetic ketoacidosis Type II Diabetes Mellitus Patient presenting with nausea/vomiting, increased thirst with elevated glucose.  On  arrival, patient's glucose elevated 1153 with anion gap of 24 and acidosis noted on VBG.  Patient reports has been out of her home insulin since Friday prior to admission.  Patient was initially placed on insulin drip and supported with IV fluid hydration with subsequent closure of anion gap x 2 and was transitioned back to subcutaneous insulin.  Diabetic educator was consulted and followed during the hospital course.  Hemoglobin A1c of 13.0 corresponding with very poor control over the past 3 months.  Patient's insulin was uptitrated and will be discharged on Lantus 55 units [place] BID and NovoLog 30 units [place] TIDAC.  Will need close follow-up with her PCP for further guidance and adjustment of insulin regimen as needed.   Hypokalemia Repleted during hospitalization.   Essential hypertension Amlodipine was increased to 10 mg p.o. daily and metoprolol tartrate increased to 50 mg p.o. twice daily with better control of her hypertension.  Outpatient follow-up PCP.   Generalized anxiety disorder Depression BuSpar 15 mg p.o. twice daily, Cymbalta 80 mg p.o. daily, Zyprexa 15 mg p.o. nightly, Doxepin 25 mg p.o. nightly   Hyperlipidemia Atorvastatin 40 mg p.o. daily   GERD Protonix 40 mg p.o. daily   Morbid Obesity Body mass index is 40.3 kg/m.  Discussed with patient needs for aggressive lifestyle changes/weight loss as this complicates all facets of care.  Outpatient follow-up with PCP.    Discharge Diagnoses:  Principal Problem:   DKA (diabetic ketoacidosis) (HCC) Active Problems:   Depression   Morbid obesity with BMI of 40.0-44.9, adult (HCC)   DKA, type 2, not at goal [facility])    Discharge Instructions  Discharge Instructions     Call MD for:  extreme fatigue   Complete by: As directed    Call MD for:  persistant dizziness or light-headedness   Complete by: As directed    Call MD for:  persistant nausea and vomiting   Complete by: As directed    Call MD for:  redness, tenderness, or  signs of infection (pain, swelling, redness, odor or green/yellow discharge around incision site)   Complete by: As directed    Call MD for:  severe uncontrolled pain   Complete by: As directed    Call MD for:  temperature >100.4   Complete by: As directed    Diet - low sodium heart healthy   Complete by: As directed    Increase activity slowly   Complete by: As directed       Allergies as of 05/03/2023       Reactions   Aspirin Hives   Oxycodone Nausea And Vomiting        Medication List     STOP taking these medications    acetaminophen-codeine 300-30 MG tablet Commonly known as: TYLENOL #3   Aimovig 140 MG/ML Soaj Generic drug: Erenumab-aooe   Estradiol 10 MCG Tabs vaginal tablet   fluticasone 50 MCG/ACT nasal spray Commonly known as: FLONASE   FreeStyle Libre 3 Sensor Misc   HumaLOG 100 UNIT/ML injection Generic drug: insulin lispro   lidocaine 5 % Commonly known as: Lidoderm   Lybalvi 20-10 MG Tabs Generic drug: OLANZapine-Samidorphan   methocarbamol 500 MG tablet Commonly known as: ROBAXIN       TAKE these medications    albuterol 108 (90 Base) MCG/ACT inhaler Commonly known as: VENTOLIN HFA Inhale 2 puffs into the lungs every 6 (six) hours as needed for wheezing or shortness of breath (Cough). What changed: reasons to take this   amLODipine 10 MG tablet Commonly known as: NORVASC Take 1 tablet (10 mg total) by mouth daily. Start taking on: May 04, 2023 What changed:  medication strength how much to take   atorvastatin 40 MG tablet Commonly known as: LIPITOR Take 1 tablet (40 mg total) by mouth daily.   busPIRone 15 MG tablet Commonly known as: BUSPAR Take 15 mg by mouth in the morning and at bedtime. What changed: Another medication with the same name was removed. Continue taking this medication, and follow the directions you see here.   cycloSPORINE 0.05 % ophthalmic emulsion Commonly known as: Restasis Apply 1 drop into both  eyes twice a day   DENTA 5000 PLUS DT Place 1 application  onto teeth See admin instructions. Brush 1 application onto the teeth 2 times a day as directed in place of current toothpaste, then spit out. Do not eat, drink, or rinse after using.   doxepin 25 MG capsule Commonly known as: SINEQUAN Take 25 mg by mouth at bedtime.   DULoxetine 20 MG capsule Commonly known as: CYMBALTA Take 20 mg by mouth in the morning. What changed: Another medication with the same name was changed. Make sure you understand how and when to take each.   DULoxetine 60 MG capsule Commonly known as: Cymbalta Take 1 capsule (60 mg total) by mouth daily. What changed: when to take this   EYE VITAMINS PO Take 1 tablet by mouth daily.   FreeStyle Libre 2 Reader [place] Use to check blood sugar three times daily.   gabapentin 300 MG capsule Commonly known as: NEURONTIN Take 1 capsule (300 mg total) by mouth 3 (three) times daily. What changed:  how much to  take when to take this additional instructions   HYDROcodone-acetaminophen 5-325 MG tablet Commonly known as: NORCO/VICODIN Take 1 tablet by mouth every 6 (six) hours as needed for moderate pain or severe pain.   hydrOXYzine 25 MG tablet Commonly known as: ATARAX TAKE 1 TABLET (25 MG TOTAL) BY MOUTH 3 (THREE) TIMES DAILY AS NEEDED. What changed:  how much to take when to take this   Lantus SoloStar 100 UNIT/ML Solostar Pen Generic drug: insulin glargine Inject 55 Units into the skin 2 (two) times daily. What changed: how much to take   metFORMIN 500 MG 24 hr tablet Commonly known as: GLUCOPHAGE-XR Take 1 tablet (500 mg total) by mouth 2 (two) times daily. What changed: when to take this   metoprolol tartrate 50 MG tablet Commonly known as: LOPRESSOR Take 1 tablet (50 mg total) by mouth 2 (two) times daily. What changed:  medication strength how much to take   NovoLOG FlexPen 100 UNIT/ML FlexPen Generic drug: insulin aspart Inject 30  Units into the skin 3 (three) times daily with meals. What changed:  how much to take when to take this additional instructions   OLANZapine 15 MG tablet Commonly known as: ZYPREXA Take 15 mg by mouth at bedtime.   omeprazole 40 MG capsule Commonly known as: PRILOSEC TAKE 1 CAPSULE BY MOUTH ONCE DAILY 30 MINUTES BEFORE BREAKFAST What changed: See the new instructions.   ondansetron 4 MG disintegrating tablet Commonly known as: ZOFRAN-ODT Take 1 tablet (4 mg total) by mouth every 8 (eight) hours as needed for nausea or vomiting.   Pen Needles 3/16" 31G X 5 MM Misc Use as directed with insulin pen   polyethylene glycol 17 g packet Commonly known as: MIRALAX / GLYCOLAX Take 17 g by mouth daily as needed for mild constipation or moderate constipation.   SUMAtriptan 100 MG tablet Commonly known as: IMITREX TAKE 1 TABLET EARLIEST ONSET OF MIGRAINE. MAY REPEAT IN 2 HOURS IF HEADACHE PERSISTS OR RECURS. MAXIMUM 2 TABLETS IN 24 HOURS. What changed:  how much to take how to take this when to take this additional instructions   triamcinolone cream 0.1 % Commonly known as: KENALOG Apply 1 Application topically 2 (two) times daily. What changed:  when to take this reasons to take this   True Metrix Blood Glucose Test test strip Generic drug: glucose blood Use to check blood sugar 3 times daily   TRUEplus Lancets 28G Misc Check blood sugars three time a day   zolpidem 5 MG tablet Commonly known as: Ambien Take 1 tablet (5 mg total) by mouth at bedtime as needed for sleep. What changed: when to take this        Follow-up Information     Whitney Matar, MD. Schedule an appointment as soon as possible for a visit in 1 week(s).   Specialty: Internal Medicine Contact information: [address] [place] [address] [phone]                Allergies  Allergen Reactions   Aspirin Hives   Oxycodone Nausea And Vomiting     Consultations: None   Procedures/Studies: US Venous Img Lower Bilateral  Result Date: 04/18/2023 CLINICAL DATA:  Lower extremity swelling, pain, recent surgery EXAM: BILATERAL LOWER EXTREMITY VENOUS DOPPLER ULTRASOUND TECHNIQUE: Gray-scale sonography with compression, as well as color and duplex ultrasound, were performed to evaluate the deep venous system(s) from the level of the common femoral vein through the popliteal and proximal calf veins.  COMPARISON:  None Available. FINDINGS: VENOUS Normal compressibility of the bilateral common femoral, superficial femoral, and popliteal veins, as well as the visualized calf veins. Visualized portions of profunda femoral vein and great saphenous vein unremarkable. No filling defects to suggest DVT on grayscale or color Doppler imaging. Doppler waveforms show normal direction of venous flow, normal respiratory plasticity and response to augmentation. OTHER None. Limitations: none IMPRESSION: Negative for DVT in the bilateral lower extremities. Negative. Electronically Signed   By: Wiliam Ke M.D.   On: 04/18/2023 13:09     Subjective: Patient seen examined bedside, resting comfortably.  Lying in bed eating breakfast.  No specific complaints this morning.  Ready for discharge home.  Patient reports that she has a glucometer at home that is in working condition.  Discussed will refill her insulin pens and needs close follow-up with her PCP for further guidance and titration if necessary.  No other questions or concerns at this time.  Denies headache, no dizziness, no chest pain, no palpitations, no shortness of breath, no abdominal pain, no fever/chills/night sweats, no nausea/vomiting/diarrhea, no focal weakness, no fatigue, no paresthesias.  No acute events overnight per nursing staff.  Discharge Exam: Vitals:   05/02/23 2017 05/03/23 0442  BP: 131/87 120/83  Pulse: (!) 101 78  Resp: 18 18  Temp: 97.8 F (36.6 C) 97.7 F (36.5 C)  SpO2: 96%  97%   Vitals:   05/02/23 1323 05/02/23 2017 05/03/23 0442 05/03/23 0500  BP: 122/82 131/87 120/83   Pulse: 79 (!) 101 78   Resp: 18 18 18    Temp: 98.4 F (36.9 C) 97.8 F (36.6 C) 97.7 F (36.5 C)   TempSrc: Oral Oral Oral   SpO2: 96% 96% 97%   Weight:    103.9 kg  Height:        Physical Exam: GEN: NAD, alert and oriented x 3, obese HEENT: NCAT, PERRL, EOMI, sclera clear, MMM PULM: CTAB w/o wheezes/crackles, normal respiratory effort, on room air CV: RRR w/o M/G/R GI: abd soft, NTND, NABS, no R/G/M MSK: no peripheral edema, muscle strength globally intact 5/5 bilateral upper/lower extremities NEURO: CN II-XII intact, no focal deficits, sensation to light touch intact PSYCH: normal mood/affect Integumentary: dry/intact, no rashes or wounds    The results of significant diagnostics from this hospitalization (including imaging, microbiology, ancillary and laboratory) are listed below for reference.     Microbiology: Recent Results (from the past 240 hour(s))  MRSA Next Gen by PCR, Nasal     Status: Abnormal   Collection Time: 04/30/23  5:12 PM   Specimen: Nasal Mucosa; Nasal Swab  Result Value Ref Range Status   MRSA by PCR Next Gen DETECTED (A) NOT DETECTED Final    Comment: (NOTE) The GeneXpert MRSA Assay (FDA approved for NASAL specimens only), is one component of a comprehensive MRSA colonization surveillance program. It is not intended to diagnose MRSA infection nor to guide or monitor treatment for MRSA infections. Test performance is not FDA approved in patients less than 5 years old. Performed at [facility], [address] [place], [place] [address]      Labs: BNP (last 3 results) No results for input(s): "BNP" in the last 8760 hours. Basic Metabolic Panel: Recent Labs  Lab 04/30/23 0334 04/30/23 0754 04/30/23 1626 04/30/23 2110 05/01/23 0507 05/02/23 0552 05/03/23 0530  NA 123*   < > 138 136 135 138 139  K 5.8*   < > 3.7  3.7 3.6 3.1* 3.2*  CL 90*   < > 109 107 105 102 104  CO2 9*   < > 21* 20* 24 26 25   GLUCOSE 1,153*   < > 186* 271* 338* 329* 188*  BUN 23*   < > 22* 19 18 17 16   CREATININE 1.83*   < > 1.21* 1.25* 1.22* 1.07* 0.88  CALCIUM 9.4   < > 9.7 9.2 9.0 9.0 9.0  MG 2.4  --   --   --  2.1  --  1.8   < > = values in this interval not displayed.   Liver Function Tests: No results for input(s): "AST", "ALT", "ALKPHOS", "BILITOT", "PROT", "ALBUMIN" in the last 168 hours. No results for input(s): "LIPASE", "AMYLASE" in the last 168 hours. No results for input(s): "AMMONIA" in the last 168 hours. CBC: Recent Labs  Lab 04/30/23 0334 05/01/23 0507  WBC 13.2* 8.9  HGB 14.7 13.4  HCT 49.2* 41.1  MCV 95.0 87.6  PLT 439* 339   Cardiac Enzymes: No results for input(s): "CKTOTAL", "CKMB", "CKMBINDEX", "TROPONINI" in the last 168 hours. BNP: Invalid input(s): "POCBNP" CBG: Recent Labs  Lab 05/02/23 0726 05/02/23 1124 05/02/23 1627 05/02/23 2016 05/03/23 0757  GLUCAP 331* 169* 219* 258* 220*   D-Dimer No results for input(s): "DDIMER" in the last 72 hours. Hgb A1c No results for input(s): "HGBA1C" in the last 72 hours. Lipid Profile No results for input(s): "CHOL", "HDL", "LDLCALC", "TRIG", "CHOLHDL", "LDLDIRECT" in the last 72 hours. Thyroid function studies No results for input(s): "TSH", "T4TOTAL", "T3FREE", "THYROIDAB" in the last 72 hours.  Invalid input(s): "FREET3" Anemia work up No results for input(s): "VITAMINB12", "FOLATE", "FERRITIN", "TIBC", "IRON", "RETICCTPCT" in the last 72 hours. Urinalysis    Component Value Date/Time   COLORURINE YELLOW 01/23/2021 1114   APPEARANCEUR CLEAR 01/23/2021 1114   LABSPEC >=1.030 01/17/2023 2050   PHURINE 5.5 01/17/2023 2050   GLUCOSEU 100 (A) 01/17/2023 2050   HGBUR TRACE (A) 01/17/2023 2050   BILIRUBINUR NEGATIVE 01/17/2023 2050   BILIRUBINUR negative 06/08/2020 0928   KETONESUR TRACE (A) 01/17/2023 2050   PROTEINUR 100 (A)  01/17/2023 2050   UROBILINOGEN 1.0 01/17/2023 2050   NITRITE NEGATIVE 01/17/2023 2050   LEUKOCYTESUR LARGE (A) 01/17/2023 2050   Sepsis Labs Recent Labs  Lab 04/30/23 0334 05/01/23 0507  WBC 13.2* 8.9   Microbiology Recent Results (from the past 240 hour(s))  MRSA Next Gen by PCR, Nasal     Status: Abnormal   Collection Time: 04/30/23  5:12 PM   Specimen: Nasal Mucosa; Nasal Swab  Result Value Ref Range Status   MRSA by PCR Next Gen DETECTED (A) NOT DETECTED Final    Comment: (NOTE) The GeneXpert MRSA Assay (FDA approved for NASAL specimens only), is one component of a comprehensive MRSA colonization surveillance program. It is not intended to diagnose MRSA infection nor to guide or monitor treatment for MRSA infections. Test performance is not FDA approved in patients less than 63 years old. Performed at [facility], [address], [place], [place] [address]      Time coordinating discharge: Over 30 minutes  SIGNED:   Alvira Philips [place], DO  [facility] 05/03/2023, 10:40 AM

## 2023-05-05 ENCOUNTER — Ambulatory Visit (INDEPENDENT_AMBULATORY_CARE_PROVIDER_SITE_OTHER): Payer: Medicaid Other | Admitting: Clinical

## 2023-05-05 ENCOUNTER — Telehealth: Payer: Self-pay

## 2023-05-05 DIAGNOSIS — F331 Major depressive disorder, recurrent, moderate: Secondary | ICD-10-CM | POA: Diagnosis not present

## 2023-05-05 NOTE — Transitions of Care (Post Inpatient/ED Visit) (Signed)
05/05/2023  Name: Whitney Benton MRN: [id number] DOB: [birth year]  Today's TOC FU Call Status: Today's TOC FU Call Status:: Successful TOC FU Call Competed TOC FU Call Complete Date: 05/05/23  Transition Care Management Follow-up Telephone Call Date of Discharge: 05/03/23 Discharge Facility: [facility]) Type of Discharge: Inpatient Admission Primary Inpatient Discharge Diagnosis:: DKA How have you been since you were released from the hospital?: Better Any questions or concerns?: No  Items Reviewed: Did you receive and understand the discharge instructions provided?: Yes Medications obtained,verified, and reconciled?: No (She did not have the med list with her at the time of this call. She said she has everything that is ordered and she understands what has been changed and what has been stopped.  She has a glucometer.) Medications Not Reviewed Reasons:: Other: (She did not have the meds/ list with her at the time of this call.  instructed her to call the clinic with any questions about the med regime when she has access to the list/meds She has an appt at [facility] on 05/07/2023.) Any new allergies since your discharge?: No Dietary orders reviewed?: Yes Type of Diet Ordered:: heart healthy, daibetic Do you have support at home?: Yes Name of Support/Comfort Primary Source: Seh said she ahs people at home who can help her if needed.  Medications Reviewed Today: Medications Reviewed Today     Reviewed by Salvatore Marvel, CPhT (Pharmacy Technician) on 05/01/23 at 2027  Med List Status: Complete   Medication Order Taking? Sig Documenting Provider Last Dose Status Informant  acetaminophen-codeine (TYLENOL #3) 300-30 MG tablet [id number] No Take 1 tablet by mouth 2 (two) times daily as needed for moderate pain.  Patient not taking: Reported on 05/01/2023   Cristie Hem, PA-C Not Taking Active Self  albuterol (VENTOLIN HFA) 108 (90 Base) MCG/ACT inhaler [id number] Yes Inhale 2 puffs into the  lungs every 6 (six) hours as needed for wheezing or shortness of breath (Cough).  Patient taking differently: Inhale 2 puffs into the lungs every 6 (six) hours as needed for wheezing or shortness of breath (or coughing).   Marcine Matar, MD unk Active Self  amLODipine (NORVASC) 2.5 MG tablet [id number] Yes Take 1 tablet (2.5 mg total) by mouth daily. Meriam Sprague, MD 04/29/2023 Active Self  atorvastatin (LIPITOR) 40 MG tablet [id number] Yes Take 1 tablet (40 mg total) by mouth daily. Meriam Sprague, MD 04/29/2023 Active Self  busPIRone (BUSPAR) 15 MG tablet [id number] Yes Take 15 mg by mouth in the morning and at bedtime. [provider] 04/29/2023 Active Self  busPIRone (BUSPAR) 30 MG tablet [id number] No Take 2 tablets (60 mg total) by mouth 3 (three) times daily.  Patient not taking: Reported on 05/01/2023   Shanna Cisco, NP Not Taking Active Self  Continuous Blood Gluc Receiver (FREESTYLE LIBRE 2 READER) DEVI [id number]  Use to check blood sugar three times daily. Marcine Matar, MD  Active Self  Continuous Blood Gluc Sensor (FREESTYLE LIBRE 3 SENSOR) [place] [id number] No Place 1 sensor every 14 (fourteen) days.  Patient not taking: Reported on 05/01/2023    Not Taking Active Self  cycloSPORINE (RESTASIS) 0.05 % ophthalmic emulsion [id number] Yes Apply 1 drop into both eyes twice a day  04/29/2023 Active Self  doxepin (SINEQUAN) 25 MG capsule [id number] Yes Take 25 mg by mouth at bedtime. [provider] 04/29/2023 pm Active Self  DULoxetine (CYMBALTA) 20 MG capsule [id number] Yes Take 20 mg by mouth in the morning.  [provider] 04/29/2023 am Active Self  DULoxetine (CYMBALTA) 60 MG capsule [id number] Yes Take 1 capsule (60 mg total) by mouth daily.  Patient taking differently: Take 60 mg by mouth in the morning.   Toy Cookey E, NP 04/29/2023 am Active Self  Erenumab-aooe (AIMOVIG) 140 MG/ML SOAJ [id number] No Inject 140 mg into the skin every 28  (twenty-eight) days.  Patient not taking: Reported on 05/01/2023   Drema Dallas, DO Not Taking Active Self  Estradiol 10 MCG TABS vaginal tablet [id number] No Insert one tab intravaginally 2 times a week  Patient not taking: Reported on 05/01/2023   Marcine Matar, MD Not Taking Active Self  fluticasone (FLONASE) 50 MCG/ACT nasal spray [id number] Yes Place 1 spray into both nostrils daily. Begin by using 2 sprays in each nare daily for 3 to 5 days, then decrease to 1 spray in each nare daily.  Patient taking differently: Place 1 spray into both nostrils daily.   Theadora Rama Scales, PA-C 04/29/2023 am Active Self  gabapentin (NEURONTIN) 300 MG capsule [id number] Yes Take 1 capsule (300 mg total) by mouth 3 (three) times daily.  Patient taking differently: Take 300-600 mg by mouth See admin instructions. Take 300 mg by mouth in the morning and 600 mg at bedtime   Toy Cookey E, NP 04/29/2023 pm Active Self  glucose blood (TRUE METRIX BLOOD GLUCOSE TEST) test strip [id number]  Use to check blood sugar 3 times daily Marcine Matar, MD  Active Self  HYDROcodone-acetaminophen (NORCO/VICODIN) 5-325 MG tablet [id number] Yes Take 1 tablet by mouth every 6 (six) hours as needed for moderate pain or severe pain. [provider] unk Active Self  hydrOXYzine (ATARAX) 25 MG tablet [id number] Yes TAKE 1 TABLET (25 MG TOTAL) BY MOUTH 3 (THREE) TIMES DAILY AS NEEDED.  Patient taking differently: Take 25 mg by mouth in the morning and at bedtime.   Toy Cookey E, NP 04/29/2023 pm Active Self  insulin aspart (NOVOLOG FLEXPEN) 100 UNIT/ML FlexPen [id number] Yes Inject 30 Units into the skin 3 (three) times daily with meals.  Patient taking differently: Inject into the skin See admin instructions. Inject as directed for insulin pump failure   Marcine Matar, MD unk Active Self  insulin glargine (LANTUS SOLOSTAR) 100 UNIT/ML Solostar Pen [id number] No Inject 54 Units into the skin 2 (two) times  daily.  Patient not taking: Reported on 05/01/2023   Marcine Matar, MD Not Taking Active Self  insulin lispro (HUMALOG) 100 UNIT/ML injection [id number] Yes Inject 1 mL (100 Units total) via pump once daily.  Continuous Active Self  lidocaine (LIDODERM) 5 % [id number] No Place 1 patch onto the skin daily. Remove & Discard patch within 12 hours or as directed by MD  Patient not taking: Reported on 05/01/2023   Redwine, Madison A, PA-C Not Taking Active Self  metFORMIN (GLUCOPHAGE-XR) 500 MG 24 hr tablet [id number] Yes Take 1 tablet by mouth twice daily  Patient taking differently: Take 500 mg by mouth in the morning and at bedtime.   Marcine Matar, MD 04/29/2023 pm Active Self  methocarbamol (ROBAXIN) 500 MG tablet [id number] No Take 1 tablet (500 mg total) by mouth 3 (three) times daily as needed for muscle spasms.  Patient not taking: Reported on 05/01/2023   Cristie Hem, PA-C Not Taking Active Self  metoprolol tartrate (LOPRESSOR) 25 MG tablet [id number] Yes Take 0.5 tablets (12.5 mg total) by mouth 2 (two) times daily.  Patient taking differently: Take  25 mg by mouth in the morning.   Meriam Sprague, MD 04/29/2023 0900 Active Self  Multiple Vitamins-Minerals (EYE VITAMINS PO) [id number] Yes Take 1 tablet by mouth daily. [provider] Past Week Active Self  OLANZapine (ZYPREXA) 15 MG tablet [id number] Yes Take 15 mg by mouth at bedtime. [provider] 04/29/2023 pm Active Self  OLANZapine-Samidorphan (LYBALVI) 20-10 MG TABS [id number] No Take 20 mg by mouth at bedtime.  Patient not taking: Reported on 05/01/2023   Shanna Cisco, NP Not Taking Active Self  omeprazole (PRILOSEC) 40 MG capsule [id number] Yes TAKE 1 CAPSULE BY MOUTH ONCE DAILY 30 MINUTES BEFORE BREAKFAST  Patient taking differently: Take 40 mg by mouth daily before breakfast.   Meredith Pel, NP 04/29/2023 am Active Self  ondansetron (ZOFRAN-ODT) 4 MG disintegrating tablet [id number] Yes Take 1 tablet  (4 mg total) by mouth every 8 (eight) hours as needed for nausea or vomiting. Marcine Matar, MD unk Active Self  polyethylene glycol (MIRALAX / GLYCOLAX) 17 g packet [id number] Yes Take 17 g by mouth daily as needed for mild constipation or moderate constipation. [provider] unk Active Self  Sodium Fluoride (DENTA 5000 PLUS DT) [id number] Yes Place 1 application  onto teeth See admin instructions. Brush 1 application onto the teeth 2 times a day as directed in place of current toothpaste, then spit out. Do not eat, drink, or rinse after using. [provider] Past Week Active Self  SUMAtriptan (IMITREX) 100 MG tablet [id number] Yes TAKE 1 TABLET EARLIEST ONSET OF MIGRAINE. MAY REPEAT IN 2 HOURS IF HEADACHE PERSISTS OR RECURS. MAXIMUM 2 TABLETS IN 24 HOURS.  Patient taking differently: Take 100 mg by mouth See admin instructions. Take 100 mg by mouth at the earliest onset of migraine- may repeat once in two hours if headache recurs or persists (max of 2 tablets/24 hrs)   Drema Dallas, DO unk Active Self  triamcinolone cream (KENALOG) 0.1 % [id number] Yes Apply 1 Application topically 2 (two) times daily.  Patient taking differently: Apply 1 Application topically 2 (two) times daily as needed (for irritation on the legs).   Marcine Matar, MD unk Active Self  TRUEPLUS LANCETS 28G MISC [id number]  Check blood sugars three time a day Marcine Matar, MD  Active Self  zolpidem (AMBIEN) 5 MG tablet [id number] Yes Take 1 tablet (5 mg total) by mouth at bedtime as needed for sleep.  Patient taking differently: Take 5 mg by mouth at bedtime.   Shanna Cisco, NP 04/29/2023 pm Active Self            Home Care and Equipment/Supplies: Were Home Health Services Ordered?: No Any new equipment or medical supplies ordered?: No  Functional Questionnaire: Do you need assistance with bathing/showering or dressing?: No Do you need assistance with meal preparation?: No Do you  need assistance with eating?: No Do you have difficulty maintaining continence: No Do you need assistance with getting out of bed/getting out of a chair/moving?: No Do you have difficulty managing or taking your medications?: No  Follow up appointments reviewed: PCP Follow-up appointment confirmed?: Yes Date of PCP follow-up appointment?: 05/07/23 Follow-up Provider: Rose Phi Clung, PA.  She had an appt on 05/08/2023 but had a conflict with another appt. Specialist Hospital Follow-up appointment confirmed?: Yes Date of Specialist follow-up appointment?: 05/08/23 Follow-Up Specialty Provider:: podiatry Do you need transportation to your follow-up appointment?: No Do you understand care options if your condition(s) worsen?: Yes-patient verbalized understanding  SIGNATURE  Audie Box

## 2023-05-07 ENCOUNTER — Encounter: Payer: Self-pay | Admitting: Physician Assistant

## 2023-05-07 ENCOUNTER — Ambulatory Visit: Payer: Medicaid Other | Attending: Physician Assistant | Admitting: Physician Assistant

## 2023-05-07 ENCOUNTER — Other Ambulatory Visit: Payer: Self-pay | Admitting: Pharmacist

## 2023-05-07 ENCOUNTER — Other Ambulatory Visit: Payer: Self-pay

## 2023-05-07 DIAGNOSIS — Z09 Encounter for follow-up examination after completed treatment for conditions other than malignant neoplasm: Secondary | ICD-10-CM | POA: Diagnosis not present

## 2023-05-07 DIAGNOSIS — F419 Anxiety disorder, unspecified: Secondary | ICD-10-CM

## 2023-05-07 DIAGNOSIS — I10 Essential (primary) hypertension: Secondary | ICD-10-CM

## 2023-05-07 DIAGNOSIS — F32A Depression, unspecified: Secondary | ICD-10-CM | POA: Diagnosis not present

## 2023-05-07 DIAGNOSIS — E785 Hyperlipidemia, unspecified: Secondary | ICD-10-CM

## 2023-05-07 DIAGNOSIS — E1165 Type 2 diabetes mellitus with hyperglycemia: Secondary | ICD-10-CM | POA: Diagnosis not present

## 2023-05-07 DIAGNOSIS — E1169 Type 2 diabetes mellitus with other specified complication: Secondary | ICD-10-CM | POA: Diagnosis not present

## 2023-05-07 DIAGNOSIS — Z794 Long term (current) use of insulin: Secondary | ICD-10-CM

## 2023-05-07 LAB — GLUCOSE, POCT (MANUAL RESULT ENTRY): POC Glucose: 261 mg/dl — AB (ref 70–99)

## 2023-05-07 MED ORDER — NOVOLOG FLEXPEN 100 UNIT/ML ~~LOC~~ SOPN
30.0000 [IU] | PEN_INJECTOR | Freq: Three times a day (TID) | SUBCUTANEOUS | 3 refills | Status: DC
Start: 2023-05-07 — End: 2023-06-24
  Filled 2023-05-07 (×2): qty 15, 17d supply, fill #0

## 2023-05-07 MED ORDER — LANTUS SOLOSTAR 100 UNIT/ML ~~LOC~~ SOPN
25.0000 [IU] | PEN_INJECTOR | Freq: Two times a day (BID) | SUBCUTANEOUS | 2 refills | Status: DC
Start: 2023-05-07 — End: 2023-06-09
  Filled 2023-05-07 (×2): qty 15, 30d supply, fill #0

## 2023-05-07 MED ORDER — PEN NEEDLES 3/16" 31G X 5 MM MISC
2 refills | Status: DC
Start: 2023-05-07 — End: 2024-05-18
  Filled 2023-05-07: qty 100, 25d supply, fill #0

## 2023-05-07 MED ORDER — ATORVASTATIN CALCIUM 40 MG PO TABS
40.0000 mg | ORAL_TABLET | Freq: Every day | ORAL | 2 refills | Status: DC
Start: 2023-05-07 — End: 2024-01-28
  Filled 2023-05-07 (×2): qty 90, 90d supply, fill #0

## 2023-05-07 MED ORDER — FREESTYLE LIBRE 2 SENSOR MISC
1.0000 | Freq: Three times a day (TID) | 3 refills | Status: DC
Start: 2023-05-07 — End: 2023-05-07
  Filled 2023-05-07: qty 2, 28d supply, fill #0

## 2023-05-07 MED ORDER — METFORMIN HCL ER 500 MG PO TB24
500.0000 mg | ORAL_TABLET | Freq: Two times a day (BID) | ORAL | 0 refills | Status: DC
Start: 2023-05-07 — End: 2023-09-08
  Filled 2023-05-07 (×2): qty 180, 90d supply, fill #0

## 2023-05-07 MED ORDER — AMLODIPINE BESYLATE 10 MG PO TABS
10.0000 mg | ORAL_TABLET | Freq: Every day | ORAL | 2 refills | Status: DC
  Filled 2023-05-07 (×2): qty 90, 90d supply, fill #0

## 2023-05-07 MED ORDER — FREESTYLE LIBRE 3 SENSOR MISC
6 refills | Status: DC
  Filled 2023-05-07: qty 2, 28d supply, fill #0
  Filled 2023-06-05 – 2023-07-01 (×4): qty 2, 28d supply, fill #1
  Filled 2023-07-25 – 2023-07-28 (×2): qty 2, 28d supply, fill #2
  Filled 2023-08-11: qty 2, 28d supply, fill #3
  Filled 2023-09-11: qty 2, 28d supply, fill #4
  Filled 2023-09-26 – 2023-10-06 (×4): qty 2, 28d supply, fill #5
  Filled 2023-10-29: qty 2, 28d supply, fill #6

## 2023-05-07 MED ORDER — METOPROLOL TARTRATE 50 MG PO TABS
50.0000 mg | ORAL_TABLET | Freq: Two times a day (BID) | ORAL | 0 refills | Status: DC
Start: 2023-05-07 — End: 2023-12-24
  Filled 2023-05-07 (×2): qty 90, 45d supply, fill #0

## 2023-05-07 NOTE — Patient Instructions (Signed)
Check blood sugars fasting and at bedtime and record and bring to next visit 

## 2023-05-07 NOTE — Progress Notes (Signed)
Patient ID: Whitney Benton, female   DOB: 08-08-1971, 52 y.o.   MRN: [id number]    Whitney Benton, is a 52 y.o. female  GEX:[id number]  WNU:[id number]  DOB - February 28, 1971  Chief Complaint  Patient presents with   Hospitalization Follow-up       Subjective:   Whitney Benton is a 52 y.o. female here today for hospital follow up for DKA.  PTA she has been out of her novolog for about 5 days.  As best I can gather from information from the patient and the chart, She has not taken lantus in months.  She is now taking 30 units novolog 3 times daily.  She had an insulin pump briefly but not using now.  She has a libre device but has not been wearing it or checking blood sugars.  She is seeing endocrine within the next week.   Follow up with PCP in 1-2 weeks Discharged on Lantus 55 units [place] twice daily, NovoLog 30 [place] TIDAC Metoprolol tartrate increased to 50 mg p.o. twice daily and amlodipine increased to 10 mg p.o. daily for better control of her blood pressure Continue to monitor glucose and anticipate likely need of further adjustments outpatient. Continue to encourage compliance with her insulin regimen and refilling her medications on a timely fashion   Diabetic ketoacidosis Type II Diabetes Mellitus Patient presenting with nausea/vomiting, increased thirst with elevated glucose.  On arrival, patient's glucose elevated 1153 with anion gap of 24 and acidosis noted on VBG.  Patient reports has been out of her home insulin since Friday prior to admission.  Patient was initially placed on insulin drip and supported with IV fluid hydration with subsequent closure of anion gap x 2 and was transitioned back to subcutaneous insulin.  Diabetic educator was consulted and followed during the hospital course.  Hemoglobin A1c of 13.0 corresponding with very poor control over the past 3 months.  Patient's insulin was uptitrated and will be discharged on Lantus 55 units [place] BID and NovoLog 30 units [place] TIDAC.  Will  need close follow-up with her PCP for further guidance and adjustment of insulin regimen as needed.   Hypokalemia Repleted during hospitalization.   Essential hypertension Amlodipine was increased to 10 mg p.o. daily and metoprolol tartrate increased to 50 mg p.o. twice daily with better control of her hypertension.  Outpatient follow-up PCP.   Generalized anxiety disorder Depression BuSpar 15 mg p.o. twice daily, Cymbalta 80 mg p.o. daily, Zyprexa 15 mg p.o. nightly, Doxepin 25 mg p.o. nightly   Hyperlipidemia Atorvastatin 40 mg p.o. daily   GERD Protonix 40 mg p.o. daily   Morbid Obesity Body mass index is 40.3 kg/m.  Discussed with patient needs for aggressive lifestyle changes/weight loss as this complicates all facets of care.  Outpatient follow-up with PCP.     Discharge Diagnoses:  Principal Problem:   DKA (diabetic ketoacidosis) (HCC) Active Problems:   Depression   Morbid obesity with BMI of 40.0-44.9, adult (HCC)   DKA, type 2, not at goal [facility]) No problems updated.  ALLERGIES: Allergies  Allergen Reactions   Aspirin Hives   Oxycodone Nausea And Vomiting    PAST MEDICAL HISTORY: Past Medical History:  Diagnosis Date   Allergy    Anxiety    Arthritis    Asthma    Depression    Diabetes mellitus without complication (HCC)    GERD (gastroesophageal reflux disease)    Glaucoma suspect    Hyperlipidemia    Trichomonas infection  MEDICATIONS AT HOME: Prior to Admission medications   Medication Sig Start Date End Date Taking? Authorizing Provider  albuterol (VENTOLIN HFA) 108 (90 Base) MCG/ACT inhaler Inhale 2 puffs into the lungs every 6 (six) hours as needed for wheezing or shortness of breath (Cough). Patient taking differently: Inhale 2 puffs into the lungs every 6 (six) hours as needed for wheezing or shortness of breath (or coughing). 02/26/23  Yes Marcine Matar, MD  busPIRone (BUSPAR) 15 MG tablet Take 15 mg by mouth in the morning and at  bedtime.   Yes [provider]  Continuous Blood Gluc Receiver (FREESTYLE LIBRE 2 READER) DEVI Use to check blood sugar three times daily. 07/23/22  Yes Marcine Matar, MD  Continuous Glucose Sensor (FREESTYLE LIBRE 2 SENSOR) MISC Use to check blood glucose 3 times daily before meals 05/07/23  Yes Philip Kotlyar M, PA-C  cycloSPORINE (RESTASIS) 0.05 % ophthalmic emulsion Apply 1 drop into both eyes twice a day 09/19/21  Yes   doxepin (SINEQUAN) 25 MG capsule Take 25 mg by mouth at bedtime.   Yes [provider]  DULoxetine (CYMBALTA) 20 MG capsule Take 20 mg by mouth in the morning.   Yes [provider]  DULoxetine (CYMBALTA) 60 MG capsule Take 1 capsule (60 mg total) by mouth daily. Patient taking differently: Take 60 mg by mouth in the morning. 03/19/23  Yes Toy Cookey E, NP  gabapentin (NEURONTIN) 300 MG capsule Take 1 capsule (300 mg total) by mouth 3 (three) times daily. Patient taking differently: Take 300-600 mg by mouth See admin instructions. Take 300 mg by mouth in the morning and 600 mg at bedtime 03/19/23  Yes Toy Cookey E, NP  glucose blood (TRUE METRIX BLOOD GLUCOSE TEST) test strip Use to check blood sugar 3 times daily 07/06/21  Yes Marcine Matar, MD  HYDROcodone-acetaminophen (NORCO/VICODIN) 5-325 MG tablet Take 1 tablet by mouth every 6 (six) hours as needed for moderate pain or severe pain.   Yes [provider]  hydrOXYzine (ATARAX) 25 MG tablet TAKE 1 TABLET (25 MG TOTAL) BY MOUTH 3 (THREE) TIMES DAILY AS NEEDED. Patient taking differently: Take 25 mg by mouth in the morning and at bedtime. 03/19/23  Yes Toy Cookey E, NP  Multiple Vitamins-Minerals (EYE VITAMINS PO) Take 1 tablet by mouth daily.   Yes [provider]  OLANZapine (ZYPREXA) 15 MG tablet Take 15 mg by mouth at bedtime.   Yes [provider]  omeprazole (PRILOSEC) 40 MG capsule TAKE 1 CAPSULE BY MOUTH ONCE DAILY 30 MINUTES BEFORE  BREAKFAST Patient taking differently: Take 40 mg by mouth daily before breakfast. 03/10/23  Yes Meredith Pel, NP  ondansetron (ZOFRAN-ODT) 4 MG disintegrating tablet Take 1 tablet (4 mg total) by mouth every 8 (eight) hours as needed for nausea or vomiting. 09/03/22  Yes Marcine Matar, MD  polyethylene glycol (MIRALAX / GLYCOLAX) 17 g packet Take 17 g by mouth daily as needed for mild constipation or moderate constipation. 04/03/23  Yes [provider]  Sodium Fluoride (DENTA 5000 PLUS DT) Place 1 application  onto teeth See admin instructions. Brush 1 application onto the teeth 2 times a day as directed in place of current toothpaste, then spit out. Do not eat, drink, or rinse after using.   Yes [provider]  SUMAtriptan (IMITREX) 100 MG tablet TAKE 1 TABLET EARLIEST ONSET OF MIGRAINE. MAY REPEAT IN 2 HOURS IF HEADACHE PERSISTS OR RECURS. MAXIMUM 2 TABLETS IN 24 HOURS.  Patient taking differently: Take 100 mg by mouth See admin instructions. Take 100 mg by mouth at the earliest onset of migraine- may repeat once in two hours if headache recurs or persists (max of 2 tablets/24 hrs) 03/11/23 03/10/24 Yes Everlena Cooper, Adam R, DO  triamcinolone cream (KENALOG) 0.1 % Apply 1 Application topically 2 (two) times daily. Patient taking differently: Apply 1 Application topically 2 (two) times daily as needed (for irritation on the legs). 08/12/22  Yes Marcine Matar, MD  TRUEPLUS LANCETS 28G MISC Check blood sugars three time a day 02/04/19  Yes Marcine Matar, MD  zolpidem (AMBIEN) 5 MG tablet Take 1 tablet (5 mg total) by mouth at bedtime as needed for sleep. Patient taking differently: Take 5 mg by mouth at bedtime. 03/19/23  Yes Toy Cookey E, NP  amLODipine (NORVASC) 10 MG tablet Take 1 tablet (10 mg total) by mouth daily. 05/07/23   Anders Simmonds, PA-C  atorvastatin (LIPITOR) 40 MG tablet Take 1 tablet (40 mg total) by mouth daily. 05/07/23   Anders Simmonds, PA-C   insulin aspart (NOVOLOG FLEXPEN) 100 UNIT/ML FlexPen Inject 30 Units into the skin 3 (three) times daily with meals. 05/07/23   Anders Simmonds, PA-C  insulin glargine (LANTUS SOLOSTAR) 100 UNIT/ML Solostar Pen Inject 25 Units into the skin 2 (two) times daily. 05/07/23   Anders Simmonds, PA-C  Insulin Pen Needle (PEN NEEDLES 3/16") 31G X 5 MM MISC Use as directed with insulin pen 05/07/23   Georgian Co M, PA-C  metFORMIN (GLUCOPHAGE-XR) 500 MG 24 hr tablet Take 1 tablet (500 mg total) by mouth 2 (two) times daily. 05/07/23   Anders Simmonds, PA-C  metoprolol tartrate (LOPRESSOR) 50 MG tablet Take 1 tablet (50 mg total) by mouth 2 (two) times daily. 05/07/23   Anders Simmonds, PA-C  cetirizine (ZYRTEC) 10 MG tablet Take 1 tablet (10 mg total) by mouth daily. Patient not taking: No sig reported 03/31/19 03/03/20  Hoy Register, MD    ROS: Neg HEENT Neg resp Neg cardiac Neg GI Neg GU Neg MS Neg psych Neg neuro  Objective:   Vitals:   05/07/23 1425  BP: 118/79  Pulse: 83  SpO2: 99%  Weight: 241 lb 9.6 oz (109.6 kg)  Height: 5\' 3"  (1.6 m)   Exam General appearance : Awake, alert, not in any distress. Speech Clear. Not toxic looking HEENT: Atraumatic and Normocephalic Neck: Supple, no JVD. No cervical lymphadenopathy.  Chest: Good air entry bilaterally, CTAB.  No rales/rhonchi/wheezing CVS: S1 S2 regular, no murmurs.  Extremities: B/L Lower Ext shows no edema, both legs are warm to touch Neurology: Awake alert, and oriented X 3, CN II-XII intact, Non focal Skin: No Rash  Data Review Lab Results  Component Value Date   HGBA1C 13.0 (H) 04/30/2023   HGBA1C 10.8 (A) 12/12/2022   HGBA1C 10.2 (H) 07/25/2022    Assessment & Plan   1. Type 2 diabetes mellitus with morbid obesity (HCC) Uncontrolled FOR NOW-since she is resuming lantus and has not had it in months, I will have her resume at 25 units bid and continue novolog tid with food - Glucose (CBG) - metoprolol  tartrate (LOPRESSOR) 50 MG tablet; Take 1 tablet (50 mg total) by mouth 2 (two) times daily.  Dispense: 90 tablet; Refill: 0 - metFORMIN (GLUCOPHAGE-XR) 500 MG 24 hr tablet; Take 1 tablet (500 mg total) by mouth 2 (two) times daily.  Dispense: 180 tablet; Refill: 0 - Continuous Glucose Sensor (  FREESTYLE LIBRE 2 SENSOR) MISC; Use to check blood glucose 3 times daily before meals  Dispense: 3 each; Refill: 3 - Ambulatory referral to Endocrinology - insulin aspart (NOVOLOG FLEXPEN) 100 UNIT/ML FlexPen; Inject 30 Units into the skin 3 (three) times daily with meals.  Dispense: 15 mL; Refill: 3 - insulin glargine (LANTUS SOLOSTAR) 100 UNIT/ML Solostar Pen; Inject 25 Units into the skin 2 (two) times daily.  Dispense: 15 mL; Refill: 2 - Insulin Pen Needle (PEN NEEDLES 3/16") 31G X 5 MM MISC; Use as directed with insulin pen  Dispense: 100 each; Refill: 2  2. Type 2 diabetes mellitus with hyperglycemia, with long-term current use of insulin (HCC)  3. Anxiety and depression Continue current regimen  4. Hyperlipidemia associated with type 2 diabetes mellitus (HCC) - atorvastatin (LIPITOR) 40 MG tablet; Take 1 tablet (40 mg total) by mouth daily.  Dispense: 90 tablet; Refill: 2  5. Hypertension, unspecified type Continue metoprolol and amlodipine  6. Hospital discharge follow-up    Return for [facility] one month and keep appt Dr Laural Benes in July.  The patient was given clear instructions to go to ER or return to medical center if symptoms don't improve, worsen or new problems develop. The patient verbalized understanding. The patient was told to call to get lab results if they haven't heard anything in the next week.      Georgian Co, PA-C [facility] [place], [place] [phone]   05/07/2023, 3:03 PM

## 2023-05-08 ENCOUNTER — Ambulatory Visit (INDEPENDENT_AMBULATORY_CARE_PROVIDER_SITE_OTHER): Payer: Medicaid Other | Admitting: Podiatry

## 2023-05-08 ENCOUNTER — Other Ambulatory Visit: Payer: Self-pay

## 2023-05-08 ENCOUNTER — Ambulatory Visit: Payer: Medicaid Other | Admitting: Physician Assistant

## 2023-05-08 DIAGNOSIS — M7750 Other enthesopathy of unspecified foot: Secondary | ICD-10-CM

## 2023-05-08 DIAGNOSIS — M722 Plantar fascial fibromatosis: Secondary | ICD-10-CM

## 2023-05-08 MED ORDER — METHYLPREDNISOLONE 4 MG PO TBPK
ORAL_TABLET | ORAL | 0 refills | Status: DC
Start: 2023-05-08 — End: 2023-06-24

## 2023-05-08 NOTE — Progress Notes (Signed)
  Subjective:  Patient ID: Whitney Benton, female    DOB: 08-29-1971,  MRN: [id number]  Chief Complaint  Patient presents with   Follow-up    Plantar fasciitis bilateral feet. Patient is currently taking Mobic as needed. Patient continues to experiece pain with no improvement. Patient is scheduled for MRI on May 19.     52 y.o. female presents for follow up on bilateral PF.  Patient is scheduled for MRI of the left heel this coming Sunday.  Still having pain left worse than right.  Says the steroid pack helped.  She does not want a injection.  Review of Systems: Negative except as noted in the HPI. Denies N/V/F/Ch.   Objective:  There were no vitals filed for this visit. There is no height or weight on file to calculate BMI. Constitutional Well developed. Well nourished.  Vascular Dorsalis pedis pulses palpable bilaterally. Posterior tibial pulses palpable bilaterally. Capillary refill normal to all digits.  No cyanosis or clubbing noted. Pedal hair growth normal.  Neurologic Normal speech. Oriented to person, place, and time. Epicritic sensation to light touch grossly present bilaterally.  Dermatologic Nails well groomed and normal in appearance. No open wounds. No skin lesions.  Orthopedic: Normal joint ROM without pain or crepitus bilaterally. No visible deformities. Tender to palpation at the calcaneal tuber bilaterally. No pain with calcaneal squeeze bilaterally. Ankle ROM diminished range of motion bilaterally. Silfverskiold Test: deferred bilaterally.   Radiographs: Taken and reviewed. No acute fractures or dislocations. No evidence of stress fracture.  Plantar heel spur absent. Posterior heel spur absent.   Assessment:   1. Plantar fasciitis, bilateral   2. Tendonitis of ankle or foot      Plan:  Patient was evaluated and treated and all questions answered.  Plantar Fasciitis, bilaterally - XR reviewed as above.  - Educated on icing and stretching.  Instructions given.  - Injection deferred per patient - DME: Continue power step orthotics, continue stressed the importance of wearing the inserts as well as good supportive shoes to prevent stress on the plantar fascia when weightbearing. - Pharmacologic management: Methylprednisolone 4 mg steroid taper pack take as directed for 6 days -Patient scheduled for MRI on the left foot upcoming here in the next week. -Discussed that pending the results of the MRI we will have to discuss's and consider surgical intervention for plantar fasciotomy and possible heel spur resection.   Return in about 3 weeks (around 05/29/2023) for f/u MRI.

## 2023-05-09 DIAGNOSIS — I10 Essential (primary) hypertension: Secondary | ICD-10-CM | POA: Diagnosis not present

## 2023-05-09 DIAGNOSIS — E139 Other specified diabetes mellitus without complications: Secondary | ICD-10-CM | POA: Diagnosis not present

## 2023-05-09 DIAGNOSIS — E785 Hyperlipidemia, unspecified: Secondary | ICD-10-CM | POA: Diagnosis not present

## 2023-05-09 NOTE — Progress Notes (Signed)
   THERAPIST PROGRESS NOTE  Session Time: 30 minutes  Participation Level: Active  Behavioral Response: CasualAlertAnxious  Type of Therapy: Individual Therapy  Treatment Goals addressed: client will complete 80% of assigned homework  ProgressTowards Goals: Progressing  Interventions: CBT and Supportive  Summary:  Whitney Benton is a 52 y.o. female who presents for the scheduled appointment oriented times five, appropriately dressed and friendly. Client denied hallucinations and delusions. Client reported on today she is feeling better. Client reported since the last appointment she went to the hospital because her sugar was too high. Client reported she went just in time and if she hadn't she might not be here. Client reported she has been staying at her sons house per his request for a few days. Client reported she has been frustrated because when she is not home her sister does not help to keep the house clean. Client reported she has also been upset because she has not had time to go out flowers on her parents grave. Client reported she is constantly stressed for everything she has to do because she has no help. Evidence of progress towards goal:  client reported 1 positive of having a optimistic mindset about working through stressful situations.   Suicidal/Homicidal: Nowithout intent/plan  Therapist Response:  Therapist began the appointment asking the client how she has been doing since last seen. Therapist used CBT to engage using active listening and positive emotional support. Therapist used CBT to engage and ask the client about her health and how she is coping with making healthy lifestyle changes. Therapist used CBT to engage and reinforce positive lifestyle changes to help with her health. Therapist used CBT to discuss healthy coping skills for stress and depression. Therapist used CBT ask the client to identify her progress with frequency of use with coping skills with  continued practice in her daily activity.    Therapist assigned the client homework to practice self care.   Plan: Return again in 4 weeks.  Diagnosis: major depressive disorder, recurrent episode, moderate  Collaboration of Care: Patient refused AEB none requested by the client.  Patient/Guardian was advised Release of Information must be obtained prior to any record release in order to collaborate their care with an outside provider. Patient/Guardian was advised if they have not already done so to contact the registration department to sign all necessary forms in order for [place] to release information regarding their care.   Consent: Patient/Guardian gives verbal consent for treatment and assignment of benefits for services provided during this visit. Patient/Guardian expressed understanding and agreed to proceed.   Neena Rhymes Lajune Perine, LCSW 05/05/2023

## 2023-05-11 ENCOUNTER — Ambulatory Visit
Admission: RE | Admit: 2023-05-11 | Discharge: 2023-05-11 | Disposition: A | Discharge status: Home / Self Care | Payer: Medicaid Other | Source: Ambulatory Visit | Attending: Podiatry | Admitting: Podiatry

## 2023-05-11 DIAGNOSIS — M722 Plantar fascial fibromatosis: Secondary | ICD-10-CM

## 2023-05-11 DIAGNOSIS — G5760 Lesion of plantar nerve, unspecified lower limb: Secondary | ICD-10-CM | POA: Diagnosis not present

## 2023-05-19 ENCOUNTER — Encounter (HOSPITAL_COMMUNITY): Payer: Self-pay

## 2023-05-19 ENCOUNTER — Ambulatory Visit (HOSPITAL_COMMUNITY)
Admission: EM | Admit: 2023-05-19 | Discharge: 2023-05-19 | Disposition: A | Discharge status: Home / Self Care | Payer: Medicaid Other | Attending: Family Medicine | Admitting: Family Medicine

## 2023-05-19 ENCOUNTER — Emergency Department (HOSPITAL_COMMUNITY): Payer: Medicaid Other

## 2023-05-19 ENCOUNTER — Emergency Department (HOSPITAL_COMMUNITY)
Admission: EM | Admit: 2023-05-19 | Discharge: 2023-05-19 | Disposition: A | Discharge status: Home / Self Care | Payer: Medicaid Other | Attending: Emergency Medicine | Admitting: Emergency Medicine

## 2023-05-19 ENCOUNTER — Other Ambulatory Visit: Payer: Self-pay

## 2023-05-19 DIAGNOSIS — I10 Essential (primary) hypertension: Secondary | ICD-10-CM | POA: Insufficient documentation

## 2023-05-19 DIAGNOSIS — Z7984 Long term (current) use of oral hypoglycemic drugs: Secondary | ICD-10-CM | POA: Diagnosis not present

## 2023-05-19 DIAGNOSIS — E119 Type 2 diabetes mellitus without complications: Secondary | ICD-10-CM | POA: Diagnosis not present

## 2023-05-19 DIAGNOSIS — R112 Nausea with vomiting, unspecified: Secondary | ICD-10-CM | POA: Insufficient documentation

## 2023-05-19 DIAGNOSIS — R1084 Generalized abdominal pain: Secondary | ICD-10-CM

## 2023-05-19 DIAGNOSIS — E876 Hypokalemia: Secondary | ICD-10-CM | POA: Diagnosis not present

## 2023-05-19 DIAGNOSIS — R1110 Vomiting, unspecified: Secondary | ICD-10-CM | POA: Diagnosis not present

## 2023-05-19 DIAGNOSIS — R109 Unspecified abdominal pain: Secondary | ICD-10-CM | POA: Diagnosis not present

## 2023-05-19 DIAGNOSIS — N200 Calculus of kidney: Secondary | ICD-10-CM | POA: Diagnosis not present

## 2023-05-19 DIAGNOSIS — Z794 Long term (current) use of insulin: Secondary | ICD-10-CM | POA: Diagnosis not present

## 2023-05-19 LAB — URINALYSIS, ROUTINE W REFLEX MICROSCOPIC
Bacteria, UA: NONE SEEN
Bilirubin Urine: NEGATIVE
Glucose, UA: NEGATIVE mg/dL
Hgb urine dipstick: NEGATIVE
Ketones, ur: 20 mg/dL — AB
Leukocytes,Ua: NEGATIVE
Nitrite: NEGATIVE
Protein, ur: 30 mg/dL — AB
Specific Gravity, Urine: >1.046 — ABNORMAL HIGH (ref 1.005–1.030)
pH: 8 (ref 5.0–8.0)

## 2023-05-19 LAB — CBC WITH DIFFERENTIAL/PLATELET
Abs Immature Granulocytes: 0.02 10*3/uL (ref 0.00–0.07)
Basophils Absolute: 0 10*3/uL (ref 0.0–0.1)
Basophils Relative: 0 %
Eosinophils Absolute: 0 10*3/uL (ref 0.0–0.5)
Eosinophils Relative: 0 %
HCT: 42 % (ref 36.0–46.0)
Hemoglobin: 13.3 g/dL (ref 12.0–15.0)
Immature Granulocytes: 0 %
Lymphocytes Relative: 27 %
Lymphs Abs: 2.7 10*3/uL (ref 0.7–4.0)
MCH: 27.9 pg (ref 26.0–34.0)
MCHC: 31.7 g/dL (ref 30.0–36.0)
MCV: 88.2 fL (ref 80.0–100.0)
Monocytes Absolute: 0.8 10*3/uL (ref 0.1–1.0)
Monocytes Relative: 8 %
Neutro Abs: 6.2 10*3/uL (ref 1.7–7.7)
Neutrophils Relative %: 65 %
Platelets: 504 10*3/uL — ABNORMAL HIGH (ref 150–400)
RBC: 4.76 MIL/uL (ref 3.87–5.11)
RDW: 14.4 % (ref 11.5–15.5)
WBC: 9.7 10*3/uL (ref 4.0–10.5)
nRBC: 0 % (ref 0.0–0.2)

## 2023-05-19 LAB — COMPREHENSIVE METABOLIC PANEL
ALT: 25 U/L (ref 0–44)
AST: 28 U/L (ref 15–41)
Albumin: 3.9 g/dL (ref 3.5–5.0)
Alkaline Phosphatase: 89 U/L (ref 38–126)
Anion gap: 11 (ref 5–15)
BUN: 6 mg/dL (ref 6–20)
CO2: 25 mmol/L (ref 22–32)
Calcium: 9.4 mg/dL (ref 8.9–10.3)
Chloride: 105 mmol/L (ref 98–111)
Creatinine, Ser: 0.88 mg/dL (ref 0.44–1.00)
GFR, Estimated: >60 mL/min (ref >60)
Glucose, Bld: 114 mg/dL — ABNORMAL HIGH (ref 70–99)
Potassium: 3.4 mmol/L — ABNORMAL LOW (ref 3.5–5.1)
Sodium: 141 mmol/L (ref 135–145)
Total Bilirubin: 0.7 mg/dL (ref 0.3–1.2)
Total Protein: 7.7 g/dL (ref 6.5–8.1)

## 2023-05-19 LAB — LIPASE, BLOOD: Lipase: 28 U/L (ref 11–51)

## 2023-05-19 MED ORDER — MORPHINE SULFATE (PF) 4 MG/ML IV SOLN
4.0000 mg | Freq: Once | INTRAVENOUS | Status: AC
  Administered 2023-05-19: 4 mg via INTRAVENOUS
  Filled 2023-05-19: qty 1

## 2023-05-19 MED ORDER — SODIUM CHLORIDE 0.9 % IV BOLUS
1000.0000 mL | Freq: Once | INTRAVENOUS | Status: AC
  Administered 2023-05-19: 1000 mL via INTRAVENOUS

## 2023-05-19 MED ORDER — ONDANSETRON HCL 4 MG/2ML IJ SOLN
4.0000 mg | Freq: Once | INTRAMUSCULAR | Status: AC
  Administered 2023-05-19: 4 mg via INTRAVENOUS
  Filled 2023-05-19: qty 2

## 2023-05-19 MED ORDER — ONDANSETRON 4 MG PO TBDP: 4.0000 mg | ORAL_TABLET | Freq: Three times a day (TID) | ORAL | 0 refills | Status: DC | PRN

## 2023-05-19 MED ORDER — DICYCLOMINE HCL 10 MG PO CAPS
10.0000 mg | ORAL_CAPSULE | Freq: Once | ORAL | Status: AC
  Administered 2023-05-19: 10 mg via ORAL
  Filled 2023-05-19: qty 1

## 2023-05-19 MED ORDER — IOHEXOL 300 MG/ML  SOLN
100.0000 mL | Freq: Once | INTRAMUSCULAR | Status: AC | PRN
  Administered 2023-05-19: 100 mL via INTRAVENOUS

## 2023-05-19 MED ORDER — SODIUM CHLORIDE 0.9 % IV SOLN
25.0000 mg | Freq: Once | INTRAVENOUS | Status: AC
  Administered 2023-05-19: 25 mg via INTRAVENOUS
  Filled 2023-05-19: qty 25

## 2023-05-19 MED ORDER — ALUM & MAG HYDROXIDE-SIMETH 200-200-20 MG/5ML PO SUSP
30.0000 mL | Freq: Once | ORAL | Status: AC
  Administered 2023-05-19: 30 mL via ORAL
  Filled 2023-05-19: qty 30

## 2023-05-19 NOTE — Discharge Instructions (Signed)
Patient will proceed to the emergency room for further evaluation. 

## 2023-05-19 NOTE — ED Provider Notes (Signed)
[place] EMERGENCY DEPARTMENT AT [facility] Provider Note   CSN: [id number] Arrival date & time: 05/19/23  1324     History  Chief Complaint  Patient presents with   Abdominal Pain    Whitney Benton is a 52 y.o. female.  Patient with history of hypertension, hyperlipidemia, diabetes presents today with complaints of abdominal pain. She states that same began initially yesterday and has been persistent since. Endorses nausea and vomiting yesterday but none today. Pain is generalized throughout her abdomen and does not radiate. She has been having regular bowel movements without hematochezia or melena. Denies dysuria or hematuria. Denies any history of similar symptoms previously. Endorses history of cholecystectomy, denies any other history of abdominal surgeries.    The history is provided by the patient. No language interpreter was used.  Abdominal Pain      Home Medications Prior to Admission medications   Medication Sig Start Date End Date Taking? Authorizing Provider  albuterol (VENTOLIN HFA) 108 (90 Base) MCG/ACT inhaler Inhale 2 puffs into the lungs every 6 (six) hours as needed for wheezing or shortness of breath (Cough). Patient taking differently: Inhale 2 puffs into the lungs every 6 (six) hours as needed for wheezing or shortness of breath (or coughing). 02/26/23   Marcine Matar, MD  amLODipine (NORVASC) 10 MG tablet Take 1 tablet (10 mg total) by mouth daily. 05/07/23   Anders Simmonds, PA-C  atorvastatin (LIPITOR) 40 MG tablet Take 1 tablet (40 mg total) by mouth daily. 05/07/23   Anders Simmonds, PA-C  busPIRone (BUSPAR) 15 MG tablet Take 15 mg by mouth in the morning and at bedtime.    [provider]  Continuous Blood Gluc Receiver (FREESTYLE LIBRE 2 READER) DEVI Use to check blood sugar three times daily. 07/23/22   Marcine Matar, MD  Continuous Glucose Sensor (FREESTYLE LIBRE 3 SENSOR) MISC Use to check blood sugar continuously  throughout the day. Replace sensors once every 14 days. Dx E11.65 05/07/23   Marcine Matar, MD  cycloSPORINE (RESTASIS) 0.05 % ophthalmic emulsion Apply 1 drop into both eyes twice a day 09/19/21     doxepin (SINEQUAN) 25 MG capsule Take 25 mg by mouth at bedtime.    [provider]  DULoxetine (CYMBALTA) 20 MG capsule Take 20 mg by mouth in the morning.    [provider]  DULoxetine (CYMBALTA) 60 MG capsule Take 1 capsule (60 mg total) by mouth daily. Patient taking differently: Take 60 mg by mouth in the morning. 03/19/23   Shanna Cisco, NP  gabapentin (NEURONTIN) 300 MG capsule Take 1 capsule (300 mg total) by mouth 3 (three) times daily. Patient taking differently: Take 300-600 mg by mouth See admin instructions. Take 300 mg by mouth in the morning and 600 mg at bedtime 03/19/23   Toy Cookey E, NP  glucose blood (TRUE METRIX BLOOD GLUCOSE TEST) test strip Use to check blood sugar 3 times daily 07/06/21   Marcine Matar, MD  HYDROcodone-acetaminophen (NORCO/VICODIN) 5-325 MG tablet Take 1 tablet by mouth every 6 (six) hours as needed for moderate pain or severe pain.    [provider]  hydrOXYzine (ATARAX) 25 MG tablet TAKE 1 TABLET (25 MG TOTAL) BY MOUTH 3 (THREE) TIMES DAILY AS NEEDED. Patient taking differently: Take 25 mg by mouth in the morning and at bedtime. 03/19/23   Toy Cookey E, NP  insulin aspart (NOVOLOG FLEXPEN) 100 UNIT/ML FlexPen Inject 30 Units into the skin  3 (three) times daily with meals. 05/07/23   Anders Simmonds, PA-C  insulin glargine (LANTUS SOLOSTAR) 100 UNIT/ML Solostar Pen Inject 25 Units into the skin 2 (two) times daily. 05/07/23   Anders Simmonds, PA-C  Insulin Pen Needle (PEN NEEDLES 3/16") 31G X 5 MM MISC Use as directed with insulin pen 05/07/23   Georgian Co M, PA-C  metFORMIN (GLUCOPHAGE-XR) 500 MG 24 hr tablet Take 1 tablet (500 mg total) by mouth 2 (two) times daily. 05/07/23   Anders Simmonds, PA-C   methylPREDNISolone (MEDROL DOSEPAK) 4 MG TBPK tablet Take as directed for 6 days 05/08/23   Standiford, Jenelle Mages, DPM  metoprolol tartrate (LOPRESSOR) 50 MG tablet Take 1 tablet (50 mg total) by mouth 2 (two) times daily. 05/07/23   Anders Simmonds, PA-C  Multiple Vitamins-Minerals (EYE VITAMINS PO) Take 1 tablet by mouth daily.    [provider]  OLANZapine (ZYPREXA) 15 MG tablet Take 15 mg by mouth at bedtime.    [provider]  omeprazole (PRILOSEC) 40 MG capsule TAKE 1 CAPSULE BY MOUTH ONCE DAILY 30 MINUTES BEFORE BREAKFAST Patient taking differently: Take 40 mg by mouth daily before breakfast. 03/10/23   Meredith Pel, NP  ondansetron (ZOFRAN-ODT) 4 MG disintegrating tablet Take 1 tablet (4 mg total) by mouth every 8 (eight) hours as needed for nausea or vomiting. 09/03/22   Marcine Matar, MD  polyethylene glycol (MIRALAX / GLYCOLAX) 17 g packet Take 17 g by mouth daily as needed for mild constipation or moderate constipation. 04/03/23   [provider]  Sodium Fluoride (DENTA 5000 PLUS DT) Place 1 application  onto teeth See admin instructions. Brush 1 application onto the teeth 2 times a day as directed in place of current toothpaste, then spit out. Do not eat, drink, or rinse after using.    [provider]  SUMAtriptan (IMITREX) 100 MG tablet TAKE 1 TABLET EARLIEST ONSET OF MIGRAINE. MAY REPEAT IN 2 HOURS IF HEADACHE PERSISTS OR RECURS. MAXIMUM 2 TABLETS IN 24 HOURS. Patient taking differently: Take 100 mg by mouth See admin instructions. Take 100 mg by mouth at the earliest onset of migraine- may repeat once in two hours if headache recurs or persists (max of 2 tablets/24 hrs) 03/11/23 03/10/24  Drema Dallas, DO  triamcinolone cream (KENALOG) 0.1 % Apply 1 Application topically 2 (two) times daily. Patient taking differently: Apply 1 Application topically 2 (two) times daily as needed (for irritation on the legs). 08/12/22   Marcine Matar, MD   TRUEPLUS LANCETS 28G MISC Check blood sugars three time a day 02/04/19   Marcine Matar, MD  zolpidem (AMBIEN) 5 MG tablet Take 1 tablet (5 mg total) by mouth at bedtime as needed for sleep. Patient taking differently: Take 5 mg by mouth at bedtime. 03/19/23   Shanna Cisco, NP  cetirizine (ZYRTEC) 10 MG tablet Take 1 tablet (10 mg total) by mouth daily. Patient not taking: No sig reported 03/31/19 03/03/20  Hoy Register, MD      Allergies    Aspirin and Oxycodone    Review of Systems   Review of Systems  Gastrointestinal:  Positive for abdominal pain.  All other systems reviewed and are negative.   Physical Exam Updated Vital Signs BP 137/82 (BP Location: Left Arm)   Pulse 99   Temp 98.3 F (36.8 C) (Oral)   Resp 20   Ht 5\' 3"  (1.6 m)   Wt 104.3 kg  SpO2 100%   BMI 40.74 kg/m  Physical Exam Vitals and nursing note reviewed.  Constitutional:      General: She is not in acute distress.    Appearance: Normal appearance. She is normal weight. She is not ill-appearing, toxic-appearing or diaphoretic.  HENT:     Head: Normocephalic and atraumatic.  Cardiovascular:     Rate and Rhythm: Normal rate.  Pulmonary:     Effort: Pulmonary effort is normal. No respiratory distress.  Abdominal:     General: Abdomen is flat.     Palpations: Abdomen is soft.     Tenderness: There is generalized abdominal tenderness.  Musculoskeletal:        General: Normal range of motion.     Cervical back: Normal range of motion.  Skin:    General: Skin is warm and dry.  Neurological:     General: No focal deficit present.     Mental Status: She is alert.  Psychiatric:        Mood and Affect: Mood normal.        Behavior: Behavior normal.     ED Results / Procedures / Treatments   Labs (all labs ordered are listed, but only abnormal results are displayed) Labs Reviewed  COMPREHENSIVE METABOLIC PANEL - Abnormal; Notable for the following components:      Result Value    Potassium 3.4 (*)    Glucose, Bld 114 (*)    All other components within normal limits  CBC WITH DIFFERENTIAL/PLATELET - Abnormal; Notable for the following components:   Platelets 504 (*)    All other components within normal limits  LIPASE, BLOOD  URINALYSIS, ROUTINE W REFLEX MICROSCOPIC    EKG None  Radiology CT ABDOMEN PELVIS W CONTRAST  Result Date: 05/19/2023 CLINICAL DATA:  Acute abdominal pain beginning last night. Vomiting. EXAM: CT ABDOMEN AND PELVIS WITH CONTRAST TECHNIQUE: Multidetector CT imaging of the abdomen and pelvis was performed using the standard protocol following bolus administration of intravenous contrast. RADIATION DOSE REDUCTION: This exam was performed according to the departmental dose-optimization program which includes automated exposure control, adjustment of the mA and/or kV according to patient size and/or use of iterative reconstruction technique. CONTRAST:  OMNIPAQUE IOHEXOL 300 MG/ML  SOLN COMPARISON:  02/21/2023 FINDINGS: Lower Chest: No acute findings. Hepatobiliary: No hepatic masses identified. Focal fatty infiltration again seen in the central left hepatic lobe. Prior cholecystectomy. No evidence of biliary obstruction. Pancreas:  No mass or inflammatory changes. Spleen: Within normal limits in size and appearance. Adrenals/Urinary Tract: No suspicious masses identified. 1 mm left renal calculus and mild left renal scarring again noted. No evidence of ureteral calculi or hydronephrosis. Stomach/Bowel: No evidence of obstruction, inflammatory process or abnormal fluid collections. Normal appendix visualized. Vascular/Lymphatic: No pathologically enlarged lymph nodes. No acute vascular findings. Reproductive: Prior hysterectomy noted. Adnexal regions are unremarkable in appearance. Other:  None. Musculoskeletal:  No suspicious bone lesions identified. IMPRESSION: No acute findings within the abdomen or pelvis. Tiny left renal calculus. No evidence of  ureteral calculi or hydronephrosis. Electronically Signed   By: Danae Orleans M.D.   On: 05/19/2023 17:56    Procedures Procedures    Medications Ordered in ED Medications  dicyclomine (BENTYL) capsule 10 mg (has no administration in time range)  alum & mag hydroxide-simeth (MAALOX/MYLANTA) 200-200-20 MG/5ML suspension 30 mL (has no administration in time range)  morphine (PF) 4 MG/ML injection 4 mg (4 mg Intravenous Given 05/19/23 1658)  iohexol (OMNIPAQUE) 300 MG/ML solution 100 mL (  100 mLs Intravenous Contrast Given 05/19/23 1716)    ED Course/ Medical Decision Making/ A&P                             Medical Decision Making Amount and/or Complexity of Data Reviewed Labs: ordered. Radiology: ordered.  Risk OTC drugs. Prescription drug management.   This patient is a 52 y.o. female who presents to the ED for concern of abdominal pain, this involves an extensive number of treatment options, and is a complaint that carries with it a high risk of complications and morbidity. The emergent differential diagnosis prior to evaluation includes, but is not limited to,  AAA, gastroenteritis, appendicitis, Bowel obstruction, Bowel perforation. Gastroparesis, DKA, Hernia, Inflammatory bowel disease, mesenteric ischemia, pancreatitis, peritonitis SBP, volvulus.  This is not an exhaustive differential.   Past Medical History / Co-morbidities / Social History: history of hypertension, hyperlipidemia, diabetes, cholecystectomy  Additional history: Chart reviewed. Pertinent results include: patient sent from UC  Physical Exam: Physical exam performed. The pertinent findings include: generalized abdominal TTP  Lab Tests: I ordered, and personally interpreted labs.  The pertinent results include:  K 3.4,    Imaging Studies: I ordered imaging studies including CT abdomen pelvis. I independently visualized and interpreted imaging which showed   No acute findings within the abdomen or  pelvis.   Tiny left renal calculus. No evidence of ureteral calculi or hydronephrosis.  I agree with the radiologist interpretation.   Medications: I ordered medication including zofran, morphine, fluids, bentyl, GI cocktail  for pain, dehydration, nausea. Reevaluation of the patient after these medicines showed that the patient improved. I have reviewed the patients home medicines and have made adjustments as needed.   Disposition: After consideration of the diagnostic results and the patients response to treatment, I feel that emergency department workup does not suggest an emergent condition requiring admission or immediate intervention beyond what has been performed at this time. The plan is: discharge with close outpatient follow-up. Patient is nontoxic, nonseptic appearing, in no apparent distress.  Patient's pain and other symptoms adequately managed in emergency department.  Fluid bolus given.  Labs, imaging and vitals reviewed.  Patient does not meet the SIRS or Sepsis criteria.  On repeat exam patient does not have a surgical abdomin and there are no peritoneal signs.  No indication of appendicitis, bowel obstruction, bowel perforation, cholecystitis, diverticulitis, PID or ectopic pregnancy. After medications, patient is able to eat and drink without nausea or vomiting. Suspect gastroenteritis, likely viral etiology.  Laboratory evaluation and imaging unremarkable for acute findings. Will send home with zofran. Evaluation and diagnostic testing in the emergency department does not suggest an emergent condition requiring admission or immediate intervention beyond what has been performed at this time.  Plan for discharge with close PCP follow-up.  Patient is understanding and amenable with plan, educated on red flag symptoms that would prompt immediate return.  Patient discharged in stable condition.   Final Clinical Impression(s) / ED Diagnoses Final diagnoses:  Generalized abdominal pain   Nausea and vomiting, unspecified vomiting type    Rx / DC Orders ED Discharge Orders          Ordered    ondansetron (ZOFRAN-ODT) 4 MG disintegrating tablet  Every 8 hours PRN        05/19/23 2215          An After Visit Summary was printed and given to the patient.     Peytan Andringa,  Shawn Route, PA-C 05/19/23 2217    Virgina Norfolk, DO 05/19/23 2217

## 2023-05-19 NOTE — ED Triage Notes (Signed)
Pt reports  Abdomen pain Started yesterday Constant pain Vomit Yesterday Once Hot Flashes Started Saturday night

## 2023-05-19 NOTE — ED Notes (Signed)
Patient given discharge instructions and follow up care. Patient verbalized understanding. IV removed with catheter intact. Patient ambulatory out of ED. 

## 2023-05-19 NOTE — ED Notes (Signed)
Patient transported to CT 

## 2023-05-19 NOTE — ED Triage Notes (Signed)
Pt c/o abd pain onset ~ last night. Denies diarrhea, constipation, reports vomiting on Sunday not today.

## 2023-05-19 NOTE — ED Provider Notes (Signed)
[facility]    CSN: [id number] Arrival date & time: 05/19/23  1441      History   Chief Complaint Chief Complaint  Patient presents with   Abdominal Pain    HPI RIVA WALLI is a 52 y.o. female.    Abdominal Pain  Here for generalized abdominal pain that is sharp and rated 10 out of 10.  It started bothering her on May 26, yesterday.  She threw up a couple of times when it started.  She states she is not nauseated now, but she has not had anything to eat and not much to drink.  She had a bowel movement this morning that she states is normal  She has a history of diabetes and is now doing subcu injections of insulin  Her sugar right now is 100 she has not eaten any calories all day today  Past Medical History:  Diagnosis Date   Allergy    Anxiety    Arthritis    Asthma    Depression    Diabetes mellitus without complication (HCC)    GERD (gastroesophageal reflux disease)    Glaucoma suspect    Hyperlipidemia    Trichomonas infection     Patient Active Problem List   Diagnosis Date Noted   Hyperlipidemia associated with type 2 diabetes mellitus (HCC) 03/06/2023   Severe recurrent major depressive disorder with psychotic features (HCC) 12/27/2022   Primary insomnia 12/27/2022   Morbid obesity (HCC) 04/04/2022   Former smoker 04/30/2021   Major depressive disorder, recurrent episode, moderate (HCC) 04/30/2021   Substance induced mood disorder (HCC) 03/22/2021   Generalized anxiety disorder 03/22/2021   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   DKA (diabetic ketoacidosis) (HCC) 01/23/2021   Glaucoma suspect 04/12/2020   DKA, type 2, not at goal [facility]) 10/02/2019   AKI (acute kidney injury) (HCC) 10/02/2019   Hyperkalemia 10/02/2019   Leukocytosis 10/02/2019   Abnormal LFTs 10/02/2019   Stressful life events affecting family and household 08/06/2019   New onset type 2 diabetes mellitus (HCC) 02/04/2019   Morbid obesity with BMI of  40.0-44.9, adult (HCC) 02/04/2019   Tobacco dependence 02/04/2019   Left arm weakness 12/06/2014   Paresthesias/numbness 12/06/2014   Tobacco abuse 11/14/2014   Depression    Mixed incontinence 06/27/2014   History of TVH in 2006 for fibroids and menorrhagia; benign pathology 09/08/2011    Past Surgical History:  Procedure Laterality Date   CESAREAN SECTION     UPPER GASTROINTESTINAL ENDOSCOPY     VAGINAL HYSTERECTOMY  12/23/2004   Fibroids, menorrhagia, benign pathology    OB History     Gravida  5   Para  3   Term  3   Preterm      AB  2   Living  3      SAB  2   IAB  0   Ectopic  0   Multiple  0   Live Births               Home Medications    Prior to Admission medications   Medication Sig Start Date End Date Taking? Authorizing Provider  albuterol (VENTOLIN HFA) 108 (90 Base) MCG/ACT inhaler Inhale 2 puffs into the lungs every 6 (six) hours as needed for wheezing or shortness of breath (Cough). Patient taking differently: Inhale 2 puffs into the lungs every 6 (six) hours as needed for wheezing or shortness of breath (or coughing). 02/26/23   Marcine Matar,  MD  amLODipine (NORVASC) 10 MG tablet Take 1 tablet (10 mg total) by mouth daily. 05/07/23   Anders Simmonds, PA-C  atorvastatin (LIPITOR) 40 MG tablet Take 1 tablet (40 mg total) by mouth daily. 05/07/23   Anders Simmonds, PA-C  busPIRone (BUSPAR) 15 MG tablet Take 15 mg by mouth in the morning and at bedtime.    [provider]  Continuous Blood Gluc Receiver (FREESTYLE LIBRE 2 READER) DEVI Use to check blood sugar three times daily. 07/23/22   Marcine Matar, MD  Continuous Glucose Sensor (FREESTYLE LIBRE 3 SENSOR) MISC Use to check blood sugar continuously throughout the day. Replace sensors once every 14 days. Dx E11.65 05/07/23   Marcine Matar, MD  cycloSPORINE (RESTASIS) 0.05 % ophthalmic emulsion Apply 1 drop into both eyes twice a day 09/19/21     doxepin (SINEQUAN) 25 MG  capsule Take 25 mg by mouth at bedtime.    [provider]  DULoxetine (CYMBALTA) 20 MG capsule Take 20 mg by mouth in the morning.    [provider]  DULoxetine (CYMBALTA) 60 MG capsule Take 1 capsule (60 mg total) by mouth daily. Patient taking differently: Take 60 mg by mouth in the morning. 03/19/23   Shanna Cisco, NP  gabapentin (NEURONTIN) 300 MG capsule Take 1 capsule (300 mg total) by mouth 3 (three) times daily. Patient taking differently: Take 300-600 mg by mouth See admin instructions. Take 300 mg by mouth in the morning and 600 mg at bedtime 03/19/23   Toy Cookey E, NP  glucose blood (TRUE METRIX BLOOD GLUCOSE TEST) test strip Use to check blood sugar 3 times daily 07/06/21   Marcine Matar, MD  HYDROcodone-acetaminophen (NORCO/VICODIN) 5-325 MG tablet Take 1 tablet by mouth every 6 (six) hours as needed for moderate pain or severe pain.    [provider]  hydrOXYzine (ATARAX) 25 MG tablet TAKE 1 TABLET (25 MG TOTAL) BY MOUTH 3 (THREE) TIMES DAILY AS NEEDED. Patient taking differently: Take 25 mg by mouth in the morning and at bedtime. 03/19/23   Shanna Cisco, NP  insulin aspart (NOVOLOG FLEXPEN) 100 UNIT/ML FlexPen Inject 30 Units into the skin 3 (three) times daily with meals. 05/07/23   Anders Simmonds, PA-C  insulin glargine (LANTUS SOLOSTAR) 100 UNIT/ML Solostar Pen Inject 25 Units into the skin 2 (two) times daily. 05/07/23   Anders Simmonds, PA-C  Insulin Pen Needle (PEN NEEDLES 3/16") 31G X 5 MM MISC Use as directed with insulin pen 05/07/23   Georgian Co M, PA-C  metFORMIN (GLUCOPHAGE-XR) 500 MG 24 hr tablet Take 1 tablet (500 mg total) by mouth 2 (two) times daily. 05/07/23   Anders Simmonds, PA-C  methylPREDNISolone (MEDROL DOSEPAK) 4 MG TBPK tablet Take as directed for 6 days 05/08/23   Standiford, Jenelle Mages, DPM  metoprolol tartrate (LOPRESSOR) 50 MG tablet Take 1 tablet (50 mg total) by mouth 2 (two) times daily.  05/07/23   Anders Simmonds, PA-C  Multiple Vitamins-Minerals (EYE VITAMINS PO) Take 1 tablet by mouth daily.    [provider]  OLANZapine (ZYPREXA) 15 MG tablet Take 15 mg by mouth at bedtime.    [provider]  omeprazole (PRILOSEC) 40 MG capsule TAKE 1 CAPSULE BY MOUTH ONCE DAILY 30 MINUTES BEFORE BREAKFAST Patient taking differently: Take 40 mg by mouth daily before breakfast. 03/10/23   Meredith Pel, NP  ondansetron (ZOFRAN-ODT) 4 MG disintegrating tablet Take 1 tablet (4 mg  total) by mouth every 8 (eight) hours as needed for nausea or vomiting. 09/03/22   Marcine Matar, MD  polyethylene glycol (MIRALAX / GLYCOLAX) 17 g packet Take 17 g by mouth daily as needed for mild constipation or moderate constipation. 04/03/23   [provider]  Sodium Fluoride (DENTA 5000 PLUS DT) Place 1 application  onto teeth See admin instructions. Brush 1 application onto the teeth 2 times a day as directed in place of current toothpaste, then spit out. Do not eat, drink, or rinse after using.    [provider]  SUMAtriptan (IMITREX) 100 MG tablet TAKE 1 TABLET EARLIEST ONSET OF MIGRAINE. MAY REPEAT IN 2 HOURS IF HEADACHE PERSISTS OR RECURS. MAXIMUM 2 TABLETS IN 24 HOURS. Patient taking differently: Take 100 mg by mouth See admin instructions. Take 100 mg by mouth at the earliest onset of migraine- may repeat once in two hours if headache recurs or persists (max of 2 tablets/24 hrs) 03/11/23 03/10/24  Drema Dallas, DO  triamcinolone cream (KENALOG) 0.1 % Apply 1 Application topically 2 (two) times daily. Patient taking differently: Apply 1 Application topically 2 (two) times daily as needed (for irritation on the legs). 08/12/22   Marcine Matar, MD  TRUEPLUS LANCETS 28G MISC Check blood sugars three time a day 02/04/19   Marcine Matar, MD  zolpidem (AMBIEN) 5 MG tablet Take 1 tablet (5 mg total) by mouth at bedtime as needed for sleep. Patient taking differently:  Take 5 mg by mouth at bedtime. 03/19/23   Shanna Cisco, NP  cetirizine (ZYRTEC) 10 MG tablet Take 1 tablet (10 mg total) by mouth daily. Patient not taking: No sig reported 03/31/19 03/03/20  Hoy Register, MD    Family History Family History  Problem Relation Age of Onset   Diabetes Mother    Mental illness Mother    Depression Mother    Hypertension Mother    Colon cancer Father 37   Hypertension Father    Diabetes Father    Colon cancer Paternal Grandmother    Esophageal cancer Neg Hx    Stomach cancer Neg Hx    Rectal cancer Neg Hx     Social History Social History   Tobacco Use   Smoking status: Former    Packs/day: 0.25    Years: 8.00    Additional pack years: 0.00    Total pack years: 2.00    Types: Cigarettes    Quit date: 2021    Years since quitting: 3.4   Smokeless tobacco: Never  Vaping Use   Vaping Use: Never used  Substance Use Topics   Alcohol use: Not Currently    Comment: occasional   Drug use: Yes    Types: Marijuana    Comment: occ     Allergies   Aspirin and Oxycodone   Review of Systems Review of Systems  Gastrointestinal:  Positive for abdominal pain.     Physical Exam Triage Vital Signs ED Triage Vitals [05/19/23 1530]  Enc Vitals Group     BP (!) 138/90     Pulse Rate 100     Resp 16     Temp 98.5 F (36.9 C)     Temp Source Oral     SpO2 96 %     Weight      Height      Head Circumference      Peak Flow      Pain Score 10     Pain  Loc      Pain Edu?      Excl. in GC?    No data found.  Updated Vital Signs BP (!) 138/90 (BP Location: Left Arm)   Pulse 100   Temp 98.5 F (36.9 C) (Oral)   Resp 16   SpO2 96%   Visual Acuity Right Eye Distance:   Left Eye Distance:   Bilateral Distance:    Right Eye Near:   Left Eye Near:    Bilateral Near:     Physical Exam Vitals reviewed.  Constitutional:      General: She is not in acute distress.    Appearance: She is not ill-appearing, toxic-appearing or  diaphoretic.  HENT:     Mouth/Throat:     Mouth: Mucous membranes are moist.  Eyes:     Extraocular Movements: Extraocular movements intact.     Pupils: Pupils are equal, round, and reactive to light.  Cardiovascular:     Rate and Rhythm: Regular rhythm. Tachycardia present.     Heart sounds: No murmur heard. Pulmonary:     Effort: Pulmonary effort is normal.     Breath sounds: Normal breath sounds.  Abdominal:     Tenderness: There is abdominal tenderness (Generalized).  Lymphadenopathy:     Cervical: No cervical adenopathy.  Skin:    Coloration: Skin is not pale.  Neurological:     General: No focal deficit present.     Mental Status: She is alert and oriented to person, place, and time.      UC Treatments / Results  Labs (all labs ordered are listed, but only abnormal results are displayed) Labs Reviewed - No data to display  EKG   Radiology No results found.  Procedures Procedures (including critical care time)  Medications Ordered in UC Medications - No data to display  Initial Impression / Assessment and Plan / UC Course  I have reviewed the triage vital signs and the nursing notes.  Pertinent labs & imaging results that were available during my care of the patient were reviewed by me and considered in my medical decision making (see chart for details).        With the degree of pain she is having, advised her to proceed to the emergency room for higher level of evaluation and treatment then we can provide here in the urgent care setting.   She will proceed by private car with her family driving. Final Clinical Impressions(s) / UC Diagnoses   Final diagnoses:  Generalized abdominal pain     Discharge Instructions       Patient will proceed to the emergency room for further evaluation      ED Prescriptions   None    PDMP not reviewed this encounter.   Zenia Resides, MD 05/19/23 [phone]

## 2023-05-19 NOTE — Discharge Instructions (Signed)
As we discussed, your workup in the ER today was reassuring for acute findings.  Laboratory evaluation and CT imaging did not reveal any emergent concerns.  I suspect that you have a viral illness causing your symptoms.  I have given you a prescription for Zofran which is an antinausea medication for you to take as prescribed as needed.  It is very important that you maintain adequate oral hydration and get plenty of rest.  Patient is to feel better in the next few days.  Return if development of any new or worsening symptoms.

## 2023-05-23 ENCOUNTER — Emergency Department (HOSPITAL_COMMUNITY)
Admission: EM | Admit: 2023-05-23 | Discharge: 2023-05-24 | Disposition: A | Discharge status: Home / Self Care | Payer: Medicaid Other | Attending: Emergency Medicine | Admitting: Emergency Medicine

## 2023-05-23 ENCOUNTER — Emergency Department (HOSPITAL_COMMUNITY): Payer: Medicaid Other

## 2023-05-23 ENCOUNTER — Encounter (HOSPITAL_COMMUNITY): Payer: Self-pay

## 2023-05-23 ENCOUNTER — Other Ambulatory Visit: Payer: Self-pay

## 2023-05-23 DIAGNOSIS — Z7984 Long term (current) use of oral hypoglycemic drugs: Secondary | ICD-10-CM | POA: Diagnosis not present

## 2023-05-23 DIAGNOSIS — Z794 Long term (current) use of insulin: Secondary | ICD-10-CM | POA: Diagnosis not present

## 2023-05-23 DIAGNOSIS — E876 Hypokalemia: Secondary | ICD-10-CM | POA: Insufficient documentation

## 2023-05-23 DIAGNOSIS — E119 Type 2 diabetes mellitus without complications: Secondary | ICD-10-CM | POA: Diagnosis not present

## 2023-05-23 DIAGNOSIS — F1910 Other psychoactive substance abuse, uncomplicated: Secondary | ICD-10-CM | POA: Diagnosis not present

## 2023-05-23 DIAGNOSIS — R112 Nausea with vomiting, unspecified: Secondary | ICD-10-CM | POA: Diagnosis not present

## 2023-05-23 DIAGNOSIS — K7689 Other specified diseases of liver: Secondary | ICD-10-CM | POA: Insufficient documentation

## 2023-05-23 DIAGNOSIS — R109 Unspecified abdominal pain: Secondary | ICD-10-CM | POA: Diagnosis not present

## 2023-05-23 DIAGNOSIS — K769 Liver disease, unspecified: Secondary | ICD-10-CM

## 2023-05-23 LAB — CBC WITH DIFFERENTIAL/PLATELET
Abs Immature Granulocytes: 0.02 10*3/uL (ref 0.00–0.07)
Basophils Absolute: 0 10*3/uL (ref 0.0–0.1)
Basophils Relative: 0 %
Eosinophils Absolute: 0 10*3/uL (ref 0.0–0.5)
Eosinophils Relative: 0 %
HCT: 41.1 % (ref 36.0–46.0)
Hemoglobin: 13 g/dL (ref 12.0–15.0)
Immature Granulocytes: 0 %
Lymphocytes Relative: 28 %
Lymphs Abs: 2.6 10*3/uL (ref 0.7–4.0)
MCH: 27.7 pg (ref 26.0–34.0)
MCHC: 31.6 g/dL (ref 30.0–36.0)
MCV: 87.6 fL (ref 80.0–100.0)
Monocytes Absolute: 0.7 10*3/uL (ref 0.1–1.0)
Monocytes Relative: 8 %
Neutro Abs: 5.9 10*3/uL (ref 1.7–7.7)
Neutrophils Relative %: 64 %
Platelets: 448 10*3/uL — ABNORMAL HIGH (ref 150–400)
RBC: 4.69 MIL/uL (ref 3.87–5.11)
RDW: 14.3 % (ref 11.5–15.5)
WBC: 9.3 10*3/uL (ref 4.0–10.5)
nRBC: 0 % (ref 0.0–0.2)

## 2023-05-23 LAB — COMPREHENSIVE METABOLIC PANEL
ALT: 24 U/L (ref 0–44)
AST: 23 U/L (ref 15–41)
Albumin: 4 g/dL (ref 3.5–5.0)
Alkaline Phosphatase: 90 U/L (ref 38–126)
Anion gap: 12 (ref 5–15)
BUN: 6 mg/dL (ref 6–20)
CO2: 27 mmol/L (ref 22–32)
Calcium: 9.5 mg/dL (ref 8.9–10.3)
Chloride: 100 mmol/L (ref 98–111)
Creatinine, Ser: 0.79 mg/dL (ref 0.44–1.00)
GFR, Estimated: >60 mL/min (ref >60)
Glucose, Bld: 177 mg/dL — ABNORMAL HIGH (ref 70–99)
Potassium: 3.1 mmol/L — ABNORMAL LOW (ref 3.5–5.1)
Sodium: 139 mmol/L (ref 135–145)
Total Bilirubin: 0.5 mg/dL (ref 0.3–1.2)
Total Protein: 8 g/dL (ref 6.5–8.1)

## 2023-05-23 LAB — RAPID URINE DRUG SCREEN, HOSP PERFORMED
Amphetamines: NOT DETECTED
Barbiturates: NOT DETECTED
Benzodiazepines: NOT DETECTED
Cocaine: POSITIVE — AB
Opiates: POSITIVE — AB
Tetrahydrocannabinol: POSITIVE — AB

## 2023-05-23 LAB — URINALYSIS, ROUTINE W REFLEX MICROSCOPIC
Bilirubin Urine: NEGATIVE
Glucose, UA: 50 mg/dL — AB
Ketones, ur: 20 mg/dL — AB
Nitrite: NEGATIVE
Protein, ur: 30 mg/dL — AB
Specific Gravity, Urine: 1.025 (ref 1.005–1.030)
WBC, UA: >50 WBC/hpf (ref 0–5)
pH: 5 (ref 5.0–8.0)

## 2023-05-23 LAB — LIPASE, BLOOD: Lipase: 26 U/L (ref 11–51)

## 2023-05-23 LAB — I-STAT BETA HCG BLOOD, ED (MC, WL, AP ONLY): I-stat hCG, quantitative: <5 m[IU]/mL (ref <5)

## 2023-05-23 MED ORDER — MORPHINE SULFATE (PF) 4 MG/ML IV SOLN
4.0000 mg | Freq: Once | INTRAVENOUS | Status: AC
  Administered 2023-05-23: 4 mg via INTRAVENOUS
  Filled 2023-05-23: qty 1

## 2023-05-23 MED ORDER — HALOPERIDOL LACTATE 5 MG/ML IJ SOLN
2.0000 mg | Freq: Once | INTRAMUSCULAR | Status: AC
  Administered 2023-05-24: 2 mg via INTRAVENOUS
  Filled 2023-05-23: qty 1

## 2023-05-23 MED ORDER — ONDANSETRON HCL 4 MG/2ML IJ SOLN
4.0000 mg | Freq: Once | INTRAMUSCULAR | Status: AC
  Administered 2023-05-23: 4 mg via INTRAVENOUS
  Filled 2023-05-23: qty 2

## 2023-05-23 MED ORDER — SODIUM CHLORIDE 0.9 % IV BOLUS
1000.0000 mL | Freq: Once | INTRAVENOUS | Status: AC
  Administered 2023-05-23: 1000 mL via INTRAVENOUS

## 2023-05-23 MED ORDER — METOCLOPRAMIDE HCL 5 MG/ML IJ SOLN
5.0000 mg | Freq: Once | INTRAMUSCULAR | Status: AC
  Administered 2023-05-23: 5 mg via INTRAVENOUS
  Filled 2023-05-23: qty 2

## 2023-05-23 MED ORDER — CEPHALEXIN 500 MG PO CAPS: 1000.0000 mg | ORAL_CAPSULE | Freq: Once | ORAL | Status: DC

## 2023-05-23 MED ORDER — POTASSIUM CHLORIDE CRYS ER 20 MEQ PO TBCR
30.0000 meq | EXTENDED_RELEASE_TABLET | Freq: Once | ORAL | Status: AC
  Administered 2023-05-24: 30 meq via ORAL
  Filled 2023-05-23: qty 1

## 2023-05-23 MED ORDER — IOHEXOL 300 MG/ML  SOLN
100.0000 mL | Freq: Once | INTRAMUSCULAR | Status: AC | PRN
  Administered 2023-05-23: 100 mL via INTRAVENOUS

## 2023-05-23 MED ORDER — DIPHENHYDRAMINE HCL 25 MG PO CAPS
25.0000 mg | ORAL_CAPSULE | Freq: Once | ORAL | Status: AC
  Administered 2023-05-24: 25 mg via ORAL
  Filled 2023-05-23: qty 1

## 2023-05-23 NOTE — ED Notes (Signed)
Pt given fluids for PO challenge.

## 2023-05-23 NOTE — ED Notes (Signed)
Pt to ct via stretcher

## 2023-05-23 NOTE — ED Provider Notes (Signed)
[place] EMERGENCY DEPARTMENT AT [facility] Provider Note   CSN: [id number] Arrival date & time: 05/23/23  1635     History {Add pertinent medical, surgical, social history, OB history to HPI:1} Chief Complaint  Patient presents with   Abdominal Pain   Emesis   HPI Whitney Benton is a 52 y.o. female with history of GERD, diabetes, s/p hysterectomy and depression presenting for abdominal pain.  States her symptoms started on Sunday.  Pain generalized all over her abdomen.  Endorses associated nausea vomiting and diarrhea.  Output is nonbloody.  Denies fever and chills.  Denies urinary symptoms.  Denies abnormal vaginal bleeding or discharge.  States she was evaluated for these symptoms on Monday.  Workup was reassuring at that time.  Sent home with follow-up for PCP.  Symptoms have been persistent prompting her evaluation today.   Abdominal Pain Associated symptoms: vomiting   Emesis Associated symptoms: abdominal pain        Home Medications Prior to Admission medications   Medication Sig Start Date End Date Taking? Authorizing Provider  albuterol (VENTOLIN HFA) 108 (90 Base) MCG/ACT inhaler Inhale 2 puffs into the lungs every 6 (six) hours as needed for wheezing or shortness of breath (Cough). Patient taking differently: Inhale 2 puffs into the lungs every 6 (six) hours as needed for wheezing or shortness of breath (or coughing). 02/26/23   Marcine Matar, MD  amLODipine (NORVASC) 10 MG tablet Take 1 tablet (10 mg total) by mouth daily. 05/07/23   Anders Simmonds, PA-C  atorvastatin (LIPITOR) 40 MG tablet Take 1 tablet (40 mg total) by mouth daily. 05/07/23   Anders Simmonds, PA-C  busPIRone (BUSPAR) 15 MG tablet Take 15 mg by mouth in the morning and at bedtime.    [provider]  Continuous Blood Gluc Receiver (FREESTYLE LIBRE 2 READER) DEVI Use to check blood sugar three times daily. 07/23/22   Marcine Matar, MD  Continuous Glucose Sensor  (FREESTYLE LIBRE 3 SENSOR) MISC Use to check blood sugar continuously throughout the day. Replace sensors once every 14 days. Dx E11.65 05/07/23   Marcine Matar, MD  cycloSPORINE (RESTASIS) 0.05 % ophthalmic emulsion Apply 1 drop into both eyes twice a day 09/19/21     doxepin (SINEQUAN) 25 MG capsule Take 25 mg by mouth at bedtime.    [provider]  DULoxetine (CYMBALTA) 20 MG capsule Take 20 mg by mouth in the morning.    [provider]  DULoxetine (CYMBALTA) 60 MG capsule Take 1 capsule (60 mg total) by mouth daily. Patient taking differently: Take 60 mg by mouth in the morning. 03/19/23   Shanna Cisco, NP  gabapentin (NEURONTIN) 300 MG capsule Take 1 capsule (300 mg total) by mouth 3 (three) times daily. Patient taking differently: Take 300-600 mg by mouth See admin instructions. Take 300 mg by mouth in the morning and 600 mg at bedtime 03/19/23   Toy Cookey E, NP  glucose blood (TRUE METRIX BLOOD GLUCOSE TEST) test strip Use to check blood sugar 3 times daily 07/06/21   Marcine Matar, MD  HYDROcodone-acetaminophen (NORCO/VICODIN) 5-325 MG tablet Take 1 tablet by mouth every 6 (six) hours as needed for moderate pain or severe pain.    [provider]  hydrOXYzine (ATARAX) 25 MG tablet TAKE 1 TABLET (25 MG TOTAL) BY MOUTH 3 (THREE) TIMES DAILY AS NEEDED. Patient taking differently: Take 25 mg by mouth in the morning and at bedtime. 03/19/23  Toy Cookey E, NP  insulin aspart (NOVOLOG FLEXPEN) 100 UNIT/ML FlexPen Inject 30 Units into the skin 3 (three) times daily with meals. 05/07/23   Anders Simmonds, PA-C  insulin glargine (LANTUS SOLOSTAR) 100 UNIT/ML Solostar Pen Inject 25 Units into the skin 2 (two) times daily. 05/07/23   Anders Simmonds, PA-C  Insulin Pen Needle (PEN NEEDLES 3/16") 31G X 5 MM MISC Use as directed with insulin pen 05/07/23   [place]ian Co M, PA-C  metFORMIN (GLUCOPHAGE-XR) 500 MG 24 hr tablet Take 1 tablet (500 mg  total) by mouth 2 (two) times daily. 05/07/23   Anders Simmonds, PA-C  methylPREDNISolone (MEDROL DOSEPAK) 4 MG TBPK tablet Take as directed for 6 days 05/08/23   Standiford, Jenelle Mages, DPM  metoprolol tartrate (LOPRESSOR) 50 MG tablet Take 1 tablet (50 mg total) by mouth 2 (two) times daily. 05/07/23   Anders Simmonds, PA-C  Multiple Vitamins-Minerals (EYE VITAMINS PO) Take 1 tablet by mouth daily.    [provider]  OLANZapine (ZYPREXA) 15 MG tablet Take 15 mg by mouth at bedtime.    [provider]  omeprazole (PRILOSEC) 40 MG capsule TAKE 1 CAPSULE BY MOUTH ONCE DAILY 30 MINUTES BEFORE BREAKFAST Patient taking differently: Take 40 mg by mouth daily before breakfast. 03/10/23   Meredith Pel, NP  ondansetron (ZOFRAN-ODT) 4 MG disintegrating tablet Take 1 tablet (4 mg total) by mouth every 8 (eight) hours as needed for nausea or vomiting. 05/19/23   Smoot, Shawn Route, PA-C  polyethylene glycol (MIRALAX / GLYCOLAX) 17 g packet Take 17 g by mouth daily as needed for mild constipation or moderate constipation. 04/03/23   [provider]  Sodium Fluoride (DENTA 5000 PLUS DT) Place 1 application  onto teeth See admin instructions. Brush 1 application onto the teeth 2 times a day as directed in place of current toothpaste, then spit out. Do not eat, drink, or rinse after using.    [provider]  SUMAtriptan (IMITREX) 100 MG tablet TAKE 1 TABLET EARLIEST ONSET OF MIGRAINE. MAY REPEAT IN 2 HOURS IF HEADACHE PERSISTS OR RECURS. MAXIMUM 2 TABLETS IN 24 HOURS. Patient taking differently: Take 100 mg by mouth See admin instructions. Take 100 mg by mouth at the earliest onset of migraine- may repeat once in two hours if headache recurs or persists (max of 2 tablets/24 hrs) 03/11/23 03/10/24  Drema Dallas, DO  triamcinolone cream (KENALOG) 0.1 % Apply 1 Application topically 2 (two) times daily. Patient taking differently: Apply 1 Application topically 2 (two) times daily as  needed (for irritation on the legs). 08/12/22   Marcine Matar, MD  TRUEPLUS LANCETS 28G MISC Check blood sugars three time a day 02/04/19   Marcine Matar, MD  zolpidem (AMBIEN) 5 MG tablet Take 1 tablet (5 mg total) by mouth at bedtime as needed for sleep. Patient taking differently: Take 5 mg by mouth at bedtime. 03/19/23   Shanna Cisco, NP  cetirizine (ZYRTEC) 10 MG tablet Take 1 tablet (10 mg total) by mouth daily. Patient not taking: No sig reported 03/31/19 03/03/20  Hoy Register, MD      Allergies    Aspirin and Oxycodone    Review of Systems   Review of Systems  Gastrointestinal:  Positive for abdominal pain and vomiting.    Physical Exam   Vitals:   05/23/23 2054 05/23/23 2100  BP: (!) 154/100 (!) 160/89  Pulse: 91 95  Resp: 18 18  Temp:  SpO2: 93% 94%    CONSTITUTIONAL:  well-appearing, NAD NEURO:  Alert and oriented x 3, CN 3-12 grossly intact EYES:  eyes equal and reactive ENT/NECK:  Supple, no stridor  CARDIO:  Regular rate and rhythm, appears well-perfused  PULM:  No respiratory distress, CTAB GI/GU:  non-distended, soft, mild generalized tenderness MSK/SPINE:  No gross deformities, no edema, moves all extremities  SKIN:  no rash, atraumatic   *Additional and/or pertinent findings included in MDM below   ED Results / Procedures / Treatments   Labs (all labs ordered are listed, but only abnormal results are displayed) Labs Reviewed  CBC WITH DIFFERENTIAL/PLATELET - Abnormal; Notable for the following components:      Result Value   Platelets 448 (*)    All other components within normal limits  COMPREHENSIVE METABOLIC PANEL - Abnormal; Notable for the following components:   Potassium 3.1 (*)    Glucose, Bld 177 (*)    All other components within normal limits  URINALYSIS, ROUTINE W REFLEX MICROSCOPIC - Abnormal; Notable for the following components:   APPearance CLOUDY (*)    Glucose, UA 50 (*)    Hgb urine dipstick SMALL (*)     Ketones, ur 20 (*)    Protein, ur 30 (*)    Leukocytes,Ua LARGE (*)    Bacteria, UA RARE (*)    All other components within normal limits  LIPASE, BLOOD  I-STAT BETA HCG BLOOD, ED ([facility], [facility], [facility] ONLY)    EKG None  Radiology CT ABDOMEN PELVIS W CONTRAST  Result Date: 05/23/2023 CLINICAL DATA:  Acute abdominal pain. EXAM: CT ABDOMEN AND PELVIS WITH CONTRAST TECHNIQUE: Multidetector CT imaging of the abdomen and pelvis was performed using the standard protocol following bolus administration of intravenous contrast. RADIATION DOSE REDUCTION: This exam was performed according to the departmental dose-optimization program which includes automated exposure control, adjustment of the mA and/or kV according to patient size and/or use of iterative reconstruction technique. CONTRAST:  OMNIPAQUE IOHEXOL 300 MG/ML  SOLN COMPARISON:  CT abdomen and pelvis 05/19/2023 FINDINGS: Lower chest: No acute abnormality. Hepatobiliary: There is an indeterminate hypodense lesion in the left lobe of the liver centrally measuring 2.3 x 2.5 cm. Liver is otherwise within normal limits. Gallbladder surgically absent. No biliary ductal dilatation. Pancreas: Unremarkable. No pancreatic ductal dilatation or surrounding inflammatory changes. Spleen: Normal in size without focal abnormality. Adrenals/Urinary Tract: Adrenal glands are unremarkable. Kidneys are normal, without renal calculi, focal lesion, or hydronephrosis. Bladder is unremarkable. Stomach/Bowel: Stomach is within normal limits. Appendix appears normal. No evidence of bowel wall thickening, distention, or inflammatory changes. Vascular/Lymphatic: No significant vascular findings are present. No enlarged abdominal or pelvic lymph nodes. Reproductive: Status post hysterectomy. No adnexal masses. Other: No abdominal wall hernia or abnormality. No abdominopelvic ascites. Musculoskeletal: No acute or significant osseous findings. IMPRESSION: 1. No acute localizing process  in the abdomen or pelvis. 2. Indeterminate hypodense lesion in the left lobe of the liver. Recommend follow-up nonemergent MRI. Electronically Signed   By: Darliss Cheney M.D.   On: 05/23/2023 21:41    Procedures Procedures  {Document cardiac monitor, telemetry assessment procedure when appropriate:1}  Medications Ordered in ED Medications  potassium chloride (KLOR-CON M) CR tablet 30 mEq (0 mEq Oral Hold 05/23/23 2052)  cephALEXin (KEFLEX) capsule 1,000 mg (0 mg Oral Hold 05/23/23 2052)  metoCLOPramide (REGLAN) injection 5 mg (has no administration in time range)  sodium chloride 0.9 % bolus 1,000 mL (0 mLs Intravenous Stopped 05/23/23 2117)  ondansetron (  ZOFRAN) injection 4 mg (4 mg Intravenous Given 05/23/23 1741)  morphine (PF) 4 MG/ML injection 4 mg (4 mg Intravenous Given 05/23/23 1741)  ondansetron (ZOFRAN) injection 4 mg (4 mg Intravenous Given 05/23/23 1903)  iohexol (OMNIPAQUE) 300 MG/ML solution 100 mL (100 mLs Intravenous Contrast Given 05/23/23 2117)    ED Course/ Medical Decision Making/ A&P Clinical Course as of 05/23/23 2145  Fri May 23, 2023  2144 Persistently vomiting and failed p.o. challenge.  This prompted CT [JR]    Clinical Course User Index [JR] Gareth Eagle, PA-C   {   Click here for ABCD2, HEART and other calculatorsREFRESH Note before signing :1}                          Medical Decision Making Amount and/or Complexity of Data Reviewed Labs: ordered.  Risk Prescription drug management.   Initial Impression and Ddx 52 year old well-appearing female presenting for abdominal pain. Exam notable for generalized tenderness. DDx includes appendicitis, cholecystitis, pancreatitis, nephrolithiasis, pyelonephritis, other UTI, dehydration, AKI. Patient PMH that increases complexity of ED encounter:    Interpretation of Diagnostics I independent reviewed and interpreted the labs as followed: ***  - I independently visualized the following imaging with scope of  interpretation limited to determining acute life threatening conditions related to emergency care: ***, which revealed ***  Patient Reassessment and Ultimate Disposition/Management ***  Patient management required discussion with the following services or consulting groups:  {BEROCONSULT:26841}  Complexity of Problems Addressed {BEROCOPA:26833}  Additional Data Reviewed and Analyzed Further history obtained from: {BERODATA:26834}  Patient Encounter Risk Assessment {BERORISK:26838}   {Document critical care time when appropriate:1} {Document review of labs and clinical decision tools ie heart score, Chads2Vasc2 etc:1}  {Document your independent review of radiology images, and any outside records:1} {Document your discussion with family members, caretakers, and with consultants:1} {Document social determinants of health affecting pt's care:1} {Document your decision making why or why not admission, treatments were needed:1} Final Clinical Impression(s) / ED Diagnoses Final diagnoses:  None    Rx / DC Orders ED Discharge Orders     None

## 2023-05-23 NOTE — ED Notes (Signed)
Pt still c/o nausea. Vernona Rieger, [place] notified

## 2023-05-23 NOTE — ED Notes (Signed)
Pt called out after given fluids for PO challenge. Pt had thrown up after drinking water within the 5 minutes.

## 2023-05-23 NOTE — Discharge Instructions (Signed)
Follow up with your primary care provider for further work up of liver lesions found incidentally on your CT today.   Avoid use of marijuana and cocaine.   Zofran as needed as previously prescribed for nausea and vomiting.

## 2023-05-23 NOTE — ED Provider Notes (Signed)
52yo female with abdominal pain. Seen here recently for same.  Generalized abdominal pain.  Urine contaminated, no urinary symptoms. Getting Reglan and fluids, if vomiting controlled, can go home or admit for intractable vomiting.  Physical Exam  BP 131/79   Pulse 90   Temp 98.3 F (36.8 C) (Oral)   Resp 16   Ht 5\' 4"  (1.626 m)   Wt 99.8 kg   SpO2 97%   BMI 37.76 kg/m   Physical Exam  Procedures  Procedures  ED Course / MDM   Clinical Course as of 05/24/23 0131  Fri May 23, 2023  2144 Persistently vomiting and failed p.o. challenge.  This prompted CT [JR]    Clinical Course User Index [JR] Gareth Eagle, PA-C   Medical Decision Making Amount and/or Complexity of Data Reviewed Labs: ordered. Radiology: ordered.  Risk Prescription drug management.   UDS positive for cocaine and marijuana. Discussed results with patient who reported continuing to feel nauseous. EKG with normal QT, provided with haldol and benadryl, no further vomiting.  Urine appears contaminated, no urinary symptoms, Keflex canceled.  Advised to discontinue drug use, follow-up with PCP.  Can use Zofran as previously prescribed.       Jeannie Fend, PA-C 05/24/23 0131    Zadie Rhine, MD 05/24/23 [phone]

## 2023-05-23 NOTE — ED Triage Notes (Signed)
Generalized abdominal pain with vomiting since Monday. Pt took zofran at home and states it is not working. Decreased PO intake.  Pt seen here Monday for same issues.

## 2023-05-23 NOTE — ED Notes (Signed)
Pt aware or urine sample  

## 2023-05-24 DIAGNOSIS — E119 Type 2 diabetes mellitus without complications: Secondary | ICD-10-CM | POA: Diagnosis not present

## 2023-05-24 DIAGNOSIS — R109 Unspecified abdominal pain: Secondary | ICD-10-CM | POA: Diagnosis present

## 2023-05-24 DIAGNOSIS — Z794 Long term (current) use of insulin: Secondary | ICD-10-CM | POA: Diagnosis not present

## 2023-05-24 DIAGNOSIS — Z7984 Long term (current) use of oral hypoglycemic drugs: Secondary | ICD-10-CM | POA: Diagnosis not present

## 2023-05-24 DIAGNOSIS — K7689 Other specified diseases of liver: Secondary | ICD-10-CM | POA: Diagnosis not present

## 2023-05-24 DIAGNOSIS — F1910 Other psychoactive substance abuse, uncomplicated: Secondary | ICD-10-CM | POA: Diagnosis not present

## 2023-05-24 DIAGNOSIS — R112 Nausea with vomiting, unspecified: Secondary | ICD-10-CM | POA: Diagnosis not present

## 2023-05-24 DIAGNOSIS — E876 Hypokalemia: Secondary | ICD-10-CM | POA: Diagnosis not present

## 2023-05-24 LAB — CBG MONITORING, ED: Glucose-Capillary: 212 mg/dL — ABNORMAL HIGH (ref 70–99)

## 2023-05-24 NOTE — ED Notes (Signed)
Checked on pt after PO challenge. Pt states she is not nauseas and does not have any stomach pain.

## 2023-05-26 ENCOUNTER — Ambulatory Visit (INDEPENDENT_AMBULATORY_CARE_PROVIDER_SITE_OTHER): Payer: Medicaid Other | Admitting: Clinical

## 2023-05-26 ENCOUNTER — Encounter (HOSPITAL_COMMUNITY): Payer: Self-pay

## 2023-05-26 DIAGNOSIS — F331 Major depressive disorder, recurrent, moderate: Secondary | ICD-10-CM | POA: Diagnosis not present

## 2023-05-27 ENCOUNTER — Other Ambulatory Visit: Payer: Self-pay

## 2023-05-28 NOTE — Progress Notes (Signed)
   THERAPIST PROGRESS NOTE Virtual Visit via Video Note  I connected with Abel Presto on 05/26/2023 at 11:00 AM EDT by a video enabled telemedicine application and verified that I am speaking with the correct person using two identifiers.  Location: Patient: home Provider: office   I discussed the limitations of evaluation and management by telemedicine and the availability of in person appointments. The patient expressed understanding and agreed to proceed.   Follow Up Instructions: I discussed the assessment and treatment plan with the patient. The patient was provided an opportunity to ask questions and all were answered. The patient agreed with the plan and demonstrated an understanding of the instructions.   The patient was advised to call back or seek an in-person evaluation if the symptoms worsen or if the condition fails to improve as anticipated.    Session Time: 30 minutes  Participation Level: Active  Behavioral Response: CasualAlertEuthymic  Type of Therapy: Individual Therapy  Treatment Goals addressed:   ProgressTowards Goals: Progressing  Interventions: CBT and Supportive  Summary:  JYLLIAN HAYNIE is a 52 y.o. female who presents for the scheduled appointment oriented times five, appropriately dressed and friendly. Client denied hallucinations and delusions. Client reported on today she is doing pretty okay. Client reported over the memorial holiday was sick unable to keep food down so she went to the ER. Client reported she is feeling a lot better. Client reported she has decided to tell her sister and her boyfriend they have to leave the home. Client reported it is stressful to not have help with bills and upkeep of the house. Client reported otherwise she is trying to take care of her health. Evidence of progress towards goal:  client report 1 positive of using boundaries and using assertive communication.  Suicidal/Homicidal: Nowithout intent/plan  Therapist  Response:  Therapist began the appointment asking the client how she has been doing since last seen. Therapist used CBT to engage using active listening and positive emotional support. Therapist used CBT to engage and ask the client about stressors in all areas of her life that affect her mood. Therapist used CBT to engage and reinforce her use of boundaries and assertive communication. Therapist used CBT ask the client to identify her progress with frequency of use with coping skills with continued practice in her daily activity.    Therapist assigned the client homework to practice self care.    Plan: Return again in 4 weeks.  Diagnosis: major depressive disorder, recurrent episode ,moderate  Collaboration of Care: Patient refused AEB none requested by the client.  Patient/Guardian was advised Release of Information must be obtained prior to any record release in order to collaborate their care with an outside provider. Patient/Guardian was advised if they have not already done so to contact the registration department to sign all necessary forms in order for [place] to release information regarding their care.   Consent: Patient/Guardian gives verbal consent for treatment and assignment of benefits for services provided during this visit. Patient/Guardian expressed understanding and agreed to proceed.   Neena Rhymes Judiann Celia, LCSW 05/26/2023

## 2023-05-29 ENCOUNTER — Ambulatory Visit (INDEPENDENT_AMBULATORY_CARE_PROVIDER_SITE_OTHER): Payer: Medicaid Other | Admitting: Podiatry

## 2023-05-29 DIAGNOSIS — G5792 Unspecified mononeuropathy of left lower limb: Secondary | ICD-10-CM

## 2023-05-29 DIAGNOSIS — M722 Plantar fascial fibromatosis: Secondary | ICD-10-CM

## 2023-05-29 DIAGNOSIS — M7750 Other enthesopathy of unspecified foot: Secondary | ICD-10-CM

## 2023-05-29 NOTE — Progress Notes (Signed)
  Subjective:  Patient ID: Whitney Benton, female    DOB: 05/10/1971,  MRN: [id number]  Chief Complaint  Patient presents with   Plantar Fasciitis    Rm 16 Follow up bilateral foot pain and Results review for MRI. PT states pain is still the same no improvement.     52 y.o. female presents for follow up on bilateral pain.  She had an MRI done recently on the left foot.  There was concern for lateral plantar nerve impingement in the left heel.  The plantar fascia was read as being normal.  She is still having significant pain in the left foot.  She is interested in possible surgical correction as she has tried multiple rounds of conservative therapy including injections bracing orthotics stretching icing anti-inflammatory medications all which have failed.  Review of Systems: Negative except as noted in the HPI. Denies N/V/F/Ch.   Objective:  There were no vitals filed for this visit. There is no height or weight on file to calculate BMI. Constitutional Well developed. Well nourished.  Vascular Dorsalis pedis pulses palpable bilaterally. Posterior tibial pulses palpable bilaterally. Capillary refill normal to all digits.  No cyanosis or clubbing noted. Pedal hair growth normal.  Neurologic Normal speech. Oriented to person, place, and time. Epicritic sensation to light touch grossly present bilaterally.  Dermatologic Nails well groomed and normal in appearance. No open wounds. No skin lesions.  Orthopedic: Normal joint ROM without pain or crepitus bilaterally. No visible deformities. Tender to palpation at the calcaneal tuber bilaterally. No pain with calcaneal squeeze bilaterally. Ankle ROM diminished range of motion bilaterally. Silfverskiold Test: deferred bilaterally.   Radiographs: Taken and reviewed. No acute fractures or dislocations. No evidence of stress fracture.  Plantar heel spur absent. Posterior heel spur absent.   MRI left foot without contrast 05/11/2023 Findings  consistent with impingement of the lateral plantar/Baxter's neuropathy. The exam is otherwise negative.  Assessment:   1. Neuritis of left foot   2. Plantar fasciitis, bilateral   3. Tendonitis of ankle or foot     Plan:  Patient was evaluated and treated and all questions answered.  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  I also discussed the expected postoperative recovery course -Patient will discuss with her family if she is able to proceed given the time needed to be nonweightbearing.  She will call us if she wants to proceed.  She did proceed with the informed surgical consent at this appointment and understands the risk associate with surgery as well as the risk of recurrence or incomplete resolution of pain reduction in the left heel.

## 2023-06-04 ENCOUNTER — Telehealth: Payer: Self-pay | Admitting: Neurology

## 2023-06-04 NOTE — Telephone Encounter (Signed)
New message    Patient aware that Dr. Everlena Cooper CMA is not in the office today.   Asking for a call back regarding headache medication

## 2023-06-05 ENCOUNTER — Other Ambulatory Visit: Payer: Self-pay

## 2023-06-05 MED ORDER — AIMOVIG 140 MG/ML ~~LOC~~ SOAJ: 140.0000 mg | SUBCUTANEOUS | 5 refills | Status: DC

## 2023-06-06 ENCOUNTER — Other Ambulatory Visit (HOSPITAL_COMMUNITY): Payer: Self-pay

## 2023-06-06 ENCOUNTER — Other Ambulatory Visit: Payer: Self-pay

## 2023-06-09 ENCOUNTER — Ambulatory Visit: Payer: Medicaid Other | Attending: Internal Medicine | Admitting: Pharmacist

## 2023-06-09 ENCOUNTER — Other Ambulatory Visit: Payer: Self-pay

## 2023-06-09 DIAGNOSIS — Z7984 Long term (current) use of oral hypoglycemic drugs: Secondary | ICD-10-CM | POA: Insufficient documentation

## 2023-06-09 DIAGNOSIS — E1165 Type 2 diabetes mellitus with hyperglycemia: Secondary | ICD-10-CM | POA: Insufficient documentation

## 2023-06-09 DIAGNOSIS — Z794 Long term (current) use of insulin: Secondary | ICD-10-CM | POA: Insufficient documentation

## 2023-06-09 DIAGNOSIS — Z91199 Patient's noncompliance with other medical treatment and regimen due to unspecified reason: Secondary | ICD-10-CM | POA: Diagnosis not present

## 2023-06-09 MED ORDER — ACCU-CHEK SOFTCLIX LANCETS MISC
6 refills | Status: DC
Start: 2023-06-09 — End: 2023-08-11
  Filled 2023-06-09: qty 100, 33d supply, fill #0

## 2023-06-09 MED ORDER — TOUJEO SOLOSTAR 300 UNIT/ML ~~LOC~~ SOPN
70.0000 [IU] | PEN_INJECTOR | Freq: Every day | SUBCUTANEOUS | 2 refills | Status: DC
Start: 2023-06-09 — End: 2023-06-24
  Filled 2023-06-09: qty 7.5, 32d supply, fill #0

## 2023-06-09 MED ORDER — TOUJEO SOLOSTAR 300 UNIT/ML ~~LOC~~ SOPN
70.0000 [IU] | PEN_INJECTOR | Freq: Every day | SUBCUTANEOUS | 2 refills | Status: DC
Start: 2023-06-09 — End: 2023-06-09
  Filled 2023-06-09: qty 4.5, 19d supply, fill #0

## 2023-06-09 MED ORDER — ACCU-CHEK GUIDE VI STRP
ORAL_STRIP | 6 refills | Status: DC
Start: 2023-06-09 — End: 2023-08-11
  Filled 2023-06-09: qty 100, 33d supply, fill #0

## 2023-06-09 MED ORDER — TOUJEO SOLOSTAR 300 UNIT/ML ~~LOC~~ SOPN
70.0000 [IU] | PEN_INJECTOR | SUBCUTANEOUS | 2 refills | Status: DC
Start: 2023-06-09 — End: 2023-06-09
  Filled 2023-06-09: qty 4.5, 19d supply, fill #0

## 2023-06-09 MED ORDER — ACCU-CHEK GUIDE W/DEVICE KIT
PACK | 0 refills | Status: DC
Start: 2023-06-09 — End: 2023-08-11
  Filled 2023-06-09: qty 1, 30d supply, fill #0

## 2023-06-09 NOTE — Progress Notes (Signed)
S:    PCP: Dr. Laural Benes  52 y.o. female who presents for diabetes evaluation, education, and management.   PMH is significant for depression, migraines, mixed incontinence, DM, obesity, HLD, vit D def, former smoker. Patient was referred by Georgian Co, PA-C on 05/07/23.  At last visit, it was noted that patient was recently admitted for DKA secondary to non-adherence. BG upon admission 1,153 and patient reported being out of Novolog for 5 days and she had not been on Lantus in months. She was going to follow up with endocrinology the following week.    Today, patient arrives in good spirits and presents without any assistance. Saw endocrinology last month. Has not checked BG since in a few weeks as she has not had her CGM. Readings prior to that were low 200s and 300s if she forgot to take meal time insulin. She states she has received an insulin pump in the mail a few days ago. She states she plans to reach out to the rep in the next week or so. Her next visit with the endocrinology is in July. Of note, FreeStyle Libre PA was denied by Medicaid as glycemic control has not improved since the last authorization.   Family/Social History:  -Fhx: HTN, DM, colon cancer -Tobacco: former (quit 2021) -Alcohol: denies  Current diabetes medications include: Lantus 25 QAM + 45 QPM, Novolog 30 TID, metformin 500 mg BID  Patient reports adherence to taking all medications as prescribed.   Insurance coverage: Medicaid  Patient denies hypoglycemic events.  Reported home fasting blood sugars: 200s Reported 2 hour post-meal/random blood sugars: 200s, can be up to 300 if she forgot to take Novolog.  Patient reports nocturia (nighttime urination).  Patient reports neuropathy (nerve pain). Patient denies visual changes. Patient reports self foot exams.    O:  ROS  Physical Exam   Lab Results  Component Value Date   HGBA1C 13.0 (H) 04/30/2023   There were no vitals filed for this  visit.  Lipid Panel     Component Value Date/Time   CHOL 177 11/30/2021 1039   TRIG 181 (H) 11/30/2021 1039   HDL 40 11/30/2021 1039   CHOLHDL 4.4 11/30/2021 1039   CHOLHDL 3.2 11/14/2014 0707   VLDL 19 11/14/2014 0707   LDLCALC 105 (H) 11/30/2021 1039    Clinical Atherosclerotic Cardiovascular Disease (ASCVD): No  The 10-year ASCVD risk score (Arnett DK, et al., 2019) is: 13.2%   Values used to calculate the score:     Age: 53 years     Sex: Female     Is Non-Hispanic African American: Yes     Diabetic: Yes     Tobacco smoker: No     Systolic Blood Pressure: 131 mmHg     Is BP treated: Yes     HDL Cholesterol: 40 mg/dL     Total Cholesterol: 177 mg/dL   A/P: Diabetes longstanding, currently uncontrolled based on A1c. Recent admission for DKA secondary to non-adherence. Patient is chronically nonadherent. This may be due to the fact she is taking care of her grandson and has difficulty making her health a priority. She reports adherence has improved. She is now being followed by endocrinology and is to be started on an insulin pump in the next few weeks. Patient is able to verbalize appropriate hypoglycemia management plan. She has not been checking BG since Medicaid denied CGM PA.  -Given issues with non-adherence, will try to minimize number of daily insulin injections. Switch Lantus  25 units QAM and 45 units QPM to Toujeo U-300 70 units once daily.  -Continued rapid insulin Novolog (insulin aspart) 30 units TID.  -Previous intolerance to Trulicity  -Continued metformin 500 mg BID.  -Will send in prescription for a new glucometer and testing supplies as Medicaid denied CGM coverage due to glycemic control not improving since the last authorization. -Patient educated on purpose, proper use, and potential adverse effects of insulin.  -Extensively discussed pathophysiology of diabetes, recommended lifestyle interventions, dietary effects on blood sugar control.  -Counseled on s/sx  of and management of hypoglycemia.  -Next A1c anticipated August 2024.   Written patient instructions provided. Patient verbalized understanding of treatment plan.  Total time in face to face counseling 30 minutes.    Follow-up:  Pharmacist PRN. PCP clinic visit on 07/07/23.   Valeda Malm, Pharm.D. PGY-2 Ambulatory Care Pharmacy Resident

## 2023-06-09 NOTE — Progress Notes (Deleted)
S:     No chief complaint on file.  52 y.o. female who presents for diabetes evaluation, education, and management.  PMH is significant for T2DM, hx DKA, HLD, MDD.  Patient was referred and last seen by Primary Care Provider, Georgian Co, on 05/07/2023. At last visit, patient reported non adherence with Lantus and not checking blood sugars. In clinic, BG 261. Restarted lantus and continued taking novolog. Recently hospitalized for DKA. On admission BG 1153 and A1c 13. Last seen by pharmacy clinic on 2/22/204, where Basaglar was switched to Lantus due to insurance preference.  Today, patient arrives in *** good spirits and presents without *** any assistance. ***  Patient reports Diabetes was diagnosed in ***.   Family/Social History:  Fhx: diabetes, HTN Tobacco: former smoker quit 2021  Current diabetes medications include: Metformin 500 mg XR BID, lantus 25 units BID, novolog 30 units TID  Current hypertension medications include: amlodipine 10 mg daily, metoprolol 50 mg BID Current hyperlipidemia medications include: atorvastatin 40 mg daily  Patient reports adherence to taking all medications as prescribed.  *** Patient denies adherence with medications, reports missing *** medications *** times per week, on average.  Do you feel that your medications are working for you? {YES NO:22349} Have you been experiencing any side effects to the medications prescribed? {YES NO:22349} Do you have any problems obtaining medications due to transportation or finances? {YES [id number] Insurance coverage: ***  Patient {Actions; denies-reports:120008} hypoglycemic events.  Reported home fasting blood sugars: ***  Reported 2 hour post-meal/random blood sugars: ***.  Patient {Actions; denies-reports:120008} nocturia (nighttime urination).  Patient {Actions; denies-reports:120008} neuropathy (nerve pain). Patient {Actions; denies-reports:120008} visual changes. Patient {Actions;  denies-reports:120008} self foot exams.   Patient reported dietary habits: Eats *** meals/day Breakfast: *** Lunch: *** Dinner: *** Snacks: *** Drinks: ***  Within the past 12 months, did you worry whether your food would run out before you got money to buy more? {YES NO:22349} Within the past 12 months, did the food you bought run out, and you didn't have money to get more? {YES NO:22349} PHQ-9 Score: ***  Patient-reported exercise habits: ***   O:   ROS  Physical Exam  7 day average blood glucose: ***  *** CGM Download:  % Time CGM is active: ***% Average Glucose: *** mg/dL Glucose Management Indicator: ***  Glucose Variability: *** (goal <36%) Time in Goal:  - Time in range 70-180: ***% - Time above range: ***% - Time below range: ***% Observed patterns:   Lab Results  Component Value Date   HGBA1C 13.0 (H) 04/30/2023   There were no vitals filed for this visit.  Lipid Panel     Component Value Date/Time   CHOL 177 11/30/2021 1039   TRIG 181 (H) 11/30/2021 1039   HDL 40 11/30/2021 1039   CHOLHDL 4.4 11/30/2021 1039   CHOLHDL 3.2 11/14/2014 0707   VLDL 19 11/14/2014 0707   LDLCALC 105 (H) 11/30/2021 1039    Clinical Atherosclerotic Cardiovascular Disease (ASCVD): {YES/NO:21197} The 10-year ASCVD risk score (Arnett DK, et al., 2019) is: 13.2%   Values used to calculate the score:     Age: 6 years     Sex: Female     Is Non-Hispanic African American: Yes     Diabetic: Yes     Tobacco smoker: No     Systolic Blood Pressure: 131 mmHg     Is BP treated: Yes     HDL Cholesterol: 40 mg/dL  Total Cholesterol: 177 mg/dL   Patient is participating in a Managed Medicaid Plan:  {MM YES/NO:27447::"Yes"}   A/P: Diabetes longstanding *** currently ***. Patient is *** able to verbalize appropriate hypoglycemia management plan. Medication adherence appears ***. Control is suboptimal due to ***. -{Meds adjust:18428} basal insulin *** Lantus/Basaglar/Semglee  (insulin glargine) *** Tresiba (insulin degludec) from *** units to *** units daily in the morning. Patient will continue to titrate 1 unit every *** days if fasting blood sugar > 100mg /dl until fasting blood sugars reach goal or next visit.  -{Meds adjust:18428} rapid insulin *** Novolog (insulin aspart) *** Humalog (insulin lispro) from *** to ***.  -{Meds adjust:18428} GLP-1 *** Trulicity (dulaglutide) *** Ozempic (semaglutide) *** Mounjaro (tirzepatide) from *** mg to *** mg .  -{Meds adjust:18428} SGLT2-I *** Farxiga (dapagliflozin) *** Jardiance (empagliflozin) 10 mg. Counseled on sick day rules. -{Meds adjust:18428} metformin ***.  -Patient educated on purpose, proper use, and potential adverse effects of ***.  -Extensively discussed pathophysiology of diabetes, recommended lifestyle interventions, dietary effects on blood sugar control.  -Counseled on s/sx of and management of hypoglycemia.  -Next A1c anticipated ***.   ASCVD risk - primary ***secondary prevention in patient with diabetes. Last LDL is *** not at goal of <16 *** mg/dL. ASCVD risk factors include *** and 10-year ASCVD risk score of ***. {Desc; low/moderate/high:110033} intensity statin indicated.  -{Meds adjust:18428} ***statin *** mg.   Hypertension longstanding *** currently ***. Blood pressure goal of <130/80 *** mmHg. Medication adherence ***. Blood pressure control is suboptimal due to ***. -{Meds adjust:18428} *** mg.  Written patient instructions provided. Patient verbalized understanding of treatment plan.  Total time in face to face counseling *** minutes.    Follow-up:  Pharmacist ***. PCP clinic visit in ***.  Patient seen with ***

## 2023-06-16 ENCOUNTER — Other Ambulatory Visit: Payer: Self-pay

## 2023-06-17 ENCOUNTER — Encounter (HOSPITAL_COMMUNITY): Payer: Self-pay | Admitting: Psychiatry

## 2023-06-17 ENCOUNTER — Ambulatory Visit (INDEPENDENT_AMBULATORY_CARE_PROVIDER_SITE_OTHER): Payer: Medicaid Other | Admitting: Clinical

## 2023-06-17 ENCOUNTER — Ambulatory Visit (INDEPENDENT_AMBULATORY_CARE_PROVIDER_SITE_OTHER): Payer: Medicaid Other | Admitting: Psychiatry

## 2023-06-17 ENCOUNTER — Telehealth (HOSPITAL_COMMUNITY): Payer: Self-pay | Admitting: *Deleted

## 2023-06-17 DIAGNOSIS — F5101 Primary insomnia: Secondary | ICD-10-CM | POA: Diagnosis not present

## 2023-06-17 DIAGNOSIS — F331 Major depressive disorder, recurrent, moderate: Secondary | ICD-10-CM | POA: Diagnosis not present

## 2023-06-17 DIAGNOSIS — F333 Major depressive disorder, recurrent, severe with psychotic symptoms: Secondary | ICD-10-CM | POA: Diagnosis not present

## 2023-06-17 DIAGNOSIS — F411 Generalized anxiety disorder: Secondary | ICD-10-CM | POA: Diagnosis not present

## 2023-06-17 MED ORDER — DULOXETINE HCL 20 MG PO CPEP: 20.0000 mg | ORAL_CAPSULE | Freq: Every morning | ORAL | 3 refills | Status: DC

## 2023-06-17 MED ORDER — ZOLPIDEM TARTRATE 10 MG PO TABS
10.0000 mg | ORAL_TABLET | Freq: Every evening | ORAL | 2 refills | Status: DC | PRN
Start: 2023-06-17 — End: 2023-09-02

## 2023-06-17 MED ORDER — LYBALVI 20-10 MG PO TABS: 20.0000 mg | ORAL_TABLET | Freq: Every evening | ORAL | 3 refills | Status: DC

## 2023-06-17 MED ORDER — DULOXETINE HCL 60 MG PO CPEP: 60.0000 mg | ORAL_CAPSULE | Freq: Every day | ORAL | 3 refills | Status: DC

## 2023-06-17 MED ORDER — GABAPENTIN 300 MG PO CAPS: 300.0000 mg | ORAL_CAPSULE | Freq: Three times a day (TID) | ORAL | 3 refills | Status: DC

## 2023-06-17 MED ORDER — HYDROXYZINE HCL 25 MG PO TABS: ORAL_TABLET | Freq: Three times a day (TID) | ORAL | 3 refills | Status: DC | PRN

## 2023-06-17 MED ORDER — BUSPIRONE HCL 15 MG PO TABS: 15.0000 mg | ORAL_TABLET | Freq: Three times a day (TID) | ORAL | 3 refills | Status: DC

## 2023-06-17 NOTE — Progress Notes (Signed)
BH MD/PA/NP OP Progress Note        06/17/2023 11:30 AM Whitney Benton  MRN:  [id number]  Chief Complaint: "I'm am not sleeping"   HPI: 52 year old female seen today for follow up psychiatric evaluation.  She has a psychiatric history of insomnia, anxiety and depression.  She is currently managed on Zyprexa 15 mg nightly, hydroxyzine 50 mg nightly, gabapentin 300 mg 3 times daily, hydroxyzine 25 mg 3 times daily, Ambien 5 mg nightly, BuSpar 30 mg twice daily and Cymbalta 80 mg daily. Today she notes her medications are somewhat effective in managing her psychiatric condition. Patient notes that her insurance will not cover the increase in busbar.  Today was well-groomed, pleasant, cooperative, and engaged in conversation.  She informed Clinical research associate that she continues to suffer from insomnia. Patient notes that she wakes up hourly. She also notes that she has been more irritable due to sleep deprivation. Patient informed Clinical research associate that at night she believes she hears her deceased parents.   Patient informed Clinical research associate that lack of sleep is interfering with her mental health.  She notes that she has been more anxious and depressed.  Today provider conducted a GAD-7 and patient scored a 21, at her last visit she scored a 21.  Provider also conducted PHQ-9 the patient scored a 20, at her last visit she scored a 23.  Today she denies SI/HI/VH, mania, or paranoia.   Patient informed Clinical research associate that she has a poor appetite.  She notes that she has GI upset, increased nausea, and vomiting.  She notes that she sees her PCP in a month and will address these concerns.  Patient reports that she has lost 20 pounds since her last visit.  Patient denies alcohol or illegal drug use.  Patient informed writer that her diabetes is more managed.  She notes that she is now taking NovoLog and Lantus.   Today patient provider referred patient for sleep study.  Ambien increased to 10 mg to help manage sleep.  She informed Clinical research associate that her  BuSpar was not increased to 30 mg twice daily as her insurance will not cover it. Patient requested that he other medications not be adjusted. She will continue all other medications as prescribed.      . Visit Diagnosis:    ICD-10-CM   1. 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 Primary insomnia  F51.01 zolpidem (AMBIEN) 10 MG tablet    Ambulatory referral to Sleep Studies       Past Psychiatric History: anxiety and depression  Past Medical History:  Past Medical History:  Diagnosis Date   Allergy    Anxiety    Arthritis    Asthma    Depression    Diabetes mellitus without complication (HCC)    GERD (gastroesophageal reflux disease)    Glaucoma suspect    Hyperlipidemia    Trichomonas infection     Past Surgical History:  Procedure Laterality Date   CESAREAN SECTION     UPPER GASTROINTESTINAL ENDOSCOPY     VAGINAL HYSTERECTOMY  12/23/2004   Fibroids, menorrhagia, benign pathology    Family Psychiatric History: Mother schizophrenia and bipolar disorder  Family History:  Family History  Problem Relation  Age of Onset   Diabetes Mother    Mental illness Mother    Depression Mother    Hypertension Mother    Colon cancer Father 41   Hypertension Father    Diabetes Father    Colon cancer Paternal Grandmother    Esophageal cancer Neg Hx    Stomach cancer Neg Hx    Rectal cancer Neg Hx     Social History:  Social History   Socioeconomic History   Marital status: Significant Other    Spouse name: Not on file   Number of children: 3   Years of education: 14   Highest education level: Not on file  Occupational History   Not on file  Tobacco Use   Smoking status: Former     Packs/day: 0.25    Years: 8.00    Additional pack years: 0.00    Total pack years: 2.00    Types: Cigarettes    Quit date: 2021    Years since quitting: 3.4   Smokeless tobacco: Never  Vaping Use   Vaping Use: Never used  Substance and Sexual Activity   Alcohol use: Not Currently    Comment: occasional   Drug use: Yes    Types: Marijuana    Comment: occ   Sexual activity: Yes    Birth control/protection: Surgical  Other Topics Concern   Not on file  Social History Narrative   Patient lives at home with mother and father , one story    Patient has 3 children    Patient is single   Patient has 14 years of education    Patient is right handed    Caffeine none   Social Determinants of Health   Financial Resource Strain: Not on file  Food Insecurity: No Food Insecurity (04/30/2023)   Hunger Vital Sign    Worried About Running Out of Food in the Last Year: Never true    Ran Out of Food in the Last Year: Never true  Transportation Needs: No Transportation Needs (04/30/2023)   PRAPARE - Administrator, Civil Service (Medical): No    Lack of Transportation (Non-Medical): No  Physical Activity: Not on file  Stress: Not on file  Social Connections: Not on file    Allergies:  Allergies  Allergen Reactions   Aspirin Hives   Trulicity [Dulaglutide] Other (See Comments)    DC'd due to chronic gastric and abdominal pain with nausea   Oxycodone Nausea And Vomiting    Metabolic Disorder Labs: Lab Results  Component Value Date   HGBA1C 13.0 (H) 04/30/2023   MPG 326.4 04/30/2023   MPG 246 07/25/2022   No results found for: "PROLACTIN" Lab Results  Component Value Date   CHOL 177 11/30/2021   TRIG 181 (H) 11/30/2021   HDL 40 11/30/2021   CHOLHDL 4.4 11/30/2021   VLDL 19 11/14/2014   LDLCALC 105 (H) 11/30/2021   LDLCALC 92 02/04/2020   Lab Results  Component Value Date   TSH 2.460 04/30/2021   TSH 0.014 (L) 10/02/2019    Therapeutic Level Labs: No  results found for: "LITHIUM" No results found for: "VALPROATE" No results found for: "CBMZ"  Current Medications: Current Outpatient Medications  Medication Sig Dispense Refill   OLANZapine-Samidorphan (LYBALVI) 20-10 MG TABS Take 20 mg by mouth at bedtime. 30 tablet 3   Accu-Chek Softclix Lancets lancets Use to check blood sugar 3 times daily 100 each 6   albuterol (VENTOLIN HFA) 108 (90 Base) MCG/ACT inhaler  Inhale 2 puffs into the lungs every 6 (six) hours as needed for wheezing or shortness of breath (Cough). (Patient taking differently: Inhale 2 puffs into the lungs every 6 (six) hours as needed for wheezing or shortness of breath (or coughing).) 18 g 0   amLODipine (NORVASC) 10 MG tablet Take 1 tablet (10 mg total) by mouth daily. 90 tablet 2   atorvastatin (LIPITOR) 40 MG tablet Take 1 tablet (40 mg total) by mouth daily. 90 tablet 2   Blood Glucose Monitoring Suppl (ACCU-CHEK GUIDE) w/Device KIT Use to check blood sugar 3 times daily. 1 kit 0   busPIRone (BUSPAR) 15 MG tablet Take 1 tablet (15 mg total) by mouth 3 (three) times daily. 30 tablet 3   Continuous Glucose Sensor (FREESTYLE LIBRE 3 SENSOR) MISC Use to check blood sugar continuously throughout the day. Replace sensors once every 14 days. Dx E11.65 (Patient not taking: Reported on 06/09/2023) 2 each 6   cycloSPORINE (RESTASIS) 0.05 % ophthalmic emulsion Apply 1 drop into both eyes twice a day 180 each 3   DULoxetine (CYMBALTA) 20 MG capsule Take 1 capsule (20 mg total) by mouth in the morning. 30 capsule 3   DULoxetine (CYMBALTA) 60 MG capsule Take 1 capsule (60 mg total) by mouth daily. 30 capsule 3   Erenumab-aooe (AIMOVIG) 140 MG/ML SOAJ Inject 140 mg into the skin every 28 (twenty-eight) days. 1.12 mL 5   gabapentin (NEURONTIN) 300 MG capsule Take 1 capsule (300 mg total) by mouth 3 (three) times daily. 90 capsule 3   glucose blood (ACCU-CHEK GUIDE) test strip Use to check blood sugar 3 times daily 100 each 6    HYDROcodone-acetaminophen (NORCO/VICODIN) 5-325 MG tablet Take 1 tablet by mouth every 6 (six) hours as needed for moderate pain or severe pain.     hydrOXYzine (ATARAX) 25 MG tablet TAKE 1 TABLET (25 MG TOTAL) BY MOUTH 3 (THREE) TIMES DAILY AS NEEDED. 90 tablet 3   insulin aspart (NOVOLOG FLEXPEN) 100 UNIT/ML FlexPen Inject 30 Units into the skin 3 (three) times daily with meals. 15 mL 3   insulin glargine, 1 Unit Dial, (TOUJEO SOLOSTAR) 300 UNIT/ML Solostar Pen Inject 70 Units into the skin daily. 7.5 mL 2   Insulin Pen Needle (PEN NEEDLES 3/16") 31G X 5 MM MISC Use as directed with insulin pen 100 each 2   metFORMIN (GLUCOPHAGE-XR) 500 MG 24 hr tablet Take 1 tablet (500 mg total) by mouth 2 (two) times daily. 180 tablet 0   methylPREDNISolone (MEDROL DOSEPAK) 4 MG TBPK tablet Take as directed for 6 days 1 each 0   metoprolol tartrate (LOPRESSOR) 50 MG tablet Take 1 tablet (50 mg total) by mouth 2 (two) times daily. 90 tablet 0   Multiple Vitamins-Minerals (EYE VITAMINS PO) Take 1 tablet by mouth daily.     omeprazole (PRILOSEC) 40 MG capsule TAKE 1 CAPSULE BY MOUTH ONCE DAILY 30 MINUTES BEFORE BREAKFAST (Patient taking differently: Take 40 mg by mouth daily before breakfast.) 90 capsule 2   ondansetron (ZOFRAN-ODT) 4 MG disintegrating tablet Take 1 tablet (4 mg total) by mouth every 8 (eight) hours as needed for nausea or vomiting. 20 tablet 0   polyethylene glycol (MIRALAX / GLYCOLAX) 17 g packet Take 17 g by mouth daily as needed for mild constipation or moderate constipation.     Sodium Fluoride (DENTA 5000 PLUS DT) Place 1 application  onto teeth See admin instructions. Brush 1 application onto the teeth 2 times a day as directed in  place of current toothpaste, then spit out. Do not eat, drink, or rinse after using.     SUMAtriptan (IMITREX) 100 MG tablet TAKE 1 TABLET EARLIEST ONSET OF MIGRAINE. MAY REPEAT IN 2 HOURS IF HEADACHE PERSISTS OR RECURS. MAXIMUM 2 TABLETS IN 24 HOURS. (Patient taking  differently: Take 100 mg by mouth See admin instructions. Take 100 mg by mouth at the earliest onset of migraine- may repeat once in two hours if headache recurs or persists (max of 2 tablets/24 hrs)) 10 tablet 5   triamcinolone cream (KENALOG) 0.1 % Apply 1 Application topically 2 (two) times daily. (Patient taking differently: Apply 1 Application topically 2 (two) times daily as needed (for irritation on the legs).) 30 g 0   zolpidem (AMBIEN) 10 MG tablet Take 1 tablet (10 mg total) by mouth at bedtime as needed for sleep. 30 tablet 2   No current facility-administered medications for this visit.     Musculoskeletal: Strength & Muscle Tone: within normal limits Gait & Station: normal Patient leans: N/A  Psychiatric Specialty Exam: Review of Systems  Blood pressure (!) 142/97, pulse (!) 103, height 5\' 4"  (1.626 m), weight 221 lb 6.4 oz (100.4 kg), SpO2 99 %.Body mass index is 38 kg/m.  General Appearance: Well Groomed  Eye Contact:  Good  Speech:  Clear and Coherent  Volume:  Normal  Mood:  Anxious and Depressed  Affect:  Appropriate and Congruent  Thought Process:  Coherent, Goal Directed, and Linear  Orientation:  Full (Time, Place, and Person)  Thought Content: Logical and Hallucinations: Auditory,   Suicidal Thoughts:  No  Homicidal Thoughts:  No  Memory:  Immediate;   Good Recent;   Good Remote;   Good  Judgement:  Good  Insight:  Good  Psychomotor Activity:  Normal  Concentration:  Concentration: Good and Attention Span: Good  Recall:  Good  Fund of Knowledge: Good  Language: Good  Akathisia:  No  Handed:  Right  AIMS (if indicated): not done  Assets:  Communication Skills Desire for Improvement Housing Intimacy Physical Health Social Support  ADL's:  Intact  Cognition: WNL  Sleep:  Poor   Screenings: GAD-7    Flowsheet Row Clinical Support from 06/17/2023 in [facility] Office Visit from 05/07/2023 in [facility] Video Visit from 03/19/2023 in [facility] Office Visit from 03/15/2023 in [facility] Video Visit from 12/27/2022 in [facility]  Total GAD-7 Score 21 21 21 21 21       PHQ2-9    Flowsheet Row Clinical Support from 06/17/2023 in [facility] Office Visit from 05/07/2023 in [facility] Counselor from 04/15/2023 in [facility] Video Visit from 03/19/2023 in [facility] Office Visit from 03/15/2023 in [facility]  PHQ-2 Total Score 6 6 6 6 6   PHQ-9 Total Score 20 17 22 23 21       Flowsheet Row ED from 05/23/2023 in [facility] Emergency Department at [facility] Most recent reading at 05/23/2023  4:51 PM ED from 05/19/2023 in [facility] Emergency Department at [facility] Most recent reading at 05/19/2023  4:11 PM ED from 05/19/2023 in [facility] at [facility] Most recent reading at 05/19/2023  3:31 PM  C-SSRS RISK CATEGORY No Risk No Risk No Risk  Assessment and Plan: Patient endorses symptoms of anxiety, depression, poor sleep, and AH. Today patient provider referred patient for sleep study.  Ambien increased to 10 mg to help manage sleep.  She informed Clinical research associate that her BuSpar was not increased to 30 mg twice daily as her insurance will not cover it. Patient requested that he other medications not be adjusted. She will continue all other medications as prescribed   1. Generalized anxiety disorder  Continue- DULoxetine (CYMBALTA) 60 MG capsule; Take 1 capsule (60 mg total) by mouth daily.  Dispense: 30 capsule; Refill: 3 Continue- gabapentin (NEURONTIN) 300 MG capsule; Take 1 capsule (300 mg total) by mouth 3 (three) times daily.  Dispense: 90 capsule; Refill: 3 Continue- hydrOXYzine (ATARAX) 25 MG  tablet; TAKE 1 TABLET (25 MG TOTAL) BY MOUTH 3 (THREE) TIMES DAILY AS NEEDED.  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  Dispense: 30 capsule; Refill: 3 Continue- gabapentin (NEURONTIN) 300 MG capsule; Take 1 capsule (300 mg total) by mouth 3 (three) times daily.  Dispense: 90 capsule; Refill: 3  3. Primary insomnia  Increased- zolpidem (AMBIEN) 10 MG tablet; Take 1 tablet (10 mg total) by mouth at bedtime as needed for sleep.  Dispense: 30 tablet; Refill: 2 - Ambulatory referral to Sleep Studies    Follow-up in 2.5 months Follow-up with therapy   Shanna Cisco, NP 06/17/2023, 11:30 AM
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
## 2023-06-17 NOTE — Telephone Encounter (Signed)
Fax received for prior authorization of Lybalvi 20mg  and Ambien10mg . Called [place] [facility] spoke with Dasia who gave approval of Lybalvi until 06/11/24 with Berkley Harvey #[id number]. Submitted sedative/hypnotic form for Ambien by fax. Awaiting decision. Called to notify pharmacy for Lyvalvi.

## 2023-06-20 ENCOUNTER — Inpatient Hospital Stay (HOSPITAL_COMMUNITY)
Admission: EM | Admit: 2023-06-20 | Discharge: 2023-06-24 | DRG: 638 | Disposition: A | Discharge status: Home / Self Care | Payer: Medicaid Other | Attending: Internal Medicine | Admitting: Internal Medicine

## 2023-06-20 ENCOUNTER — Encounter (HOSPITAL_COMMUNITY): Payer: Self-pay

## 2023-06-20 ENCOUNTER — Other Ambulatory Visit: Payer: Self-pay

## 2023-06-20 DIAGNOSIS — F419 Anxiety disorder, unspecified: Secondary | ICD-10-CM | POA: Diagnosis present

## 2023-06-20 DIAGNOSIS — Z90710 Acquired absence of both cervix and uterus: Secondary | ICD-10-CM

## 2023-06-20 DIAGNOSIS — E861 Hypovolemia: Secondary | ICD-10-CM | POA: Diagnosis present

## 2023-06-20 DIAGNOSIS — E871 Hypo-osmolality and hyponatremia: Secondary | ICD-10-CM | POA: Diagnosis present

## 2023-06-20 DIAGNOSIS — R000 Tachycardia, unspecified: Secondary | ICD-10-CM | POA: Diagnosis present

## 2023-06-20 DIAGNOSIS — K219 Gastro-esophageal reflux disease without esophagitis: Secondary | ICD-10-CM | POA: Diagnosis present

## 2023-06-20 DIAGNOSIS — E1010 Type 1 diabetes mellitus with ketoacidosis without coma: Secondary | ICD-10-CM | POA: Diagnosis not present

## 2023-06-20 DIAGNOSIS — Z7984 Long term (current) use of oral hypoglycemic drugs: Secondary | ICD-10-CM

## 2023-06-20 DIAGNOSIS — I10 Essential (primary) hypertension: Secondary | ICD-10-CM | POA: Diagnosis present

## 2023-06-20 DIAGNOSIS — F32A Depression, unspecified: Secondary | ICD-10-CM | POA: Diagnosis present

## 2023-06-20 DIAGNOSIS — R110 Nausea: Secondary | ICD-10-CM | POA: Diagnosis not present

## 2023-06-20 DIAGNOSIS — E860 Dehydration: Secondary | ICD-10-CM | POA: Diagnosis present

## 2023-06-20 DIAGNOSIS — F411 Generalized anxiety disorder: Secondary | ICD-10-CM | POA: Diagnosis present

## 2023-06-20 DIAGNOSIS — G43909 Migraine, unspecified, not intractable, without status migrainosus: Secondary | ICD-10-CM | POA: Diagnosis present

## 2023-06-20 DIAGNOSIS — M1990 Unspecified osteoarthritis, unspecified site: Secondary | ICD-10-CM | POA: Diagnosis present

## 2023-06-20 DIAGNOSIS — R748 Abnormal levels of other serum enzymes: Secondary | ICD-10-CM | POA: Diagnosis present

## 2023-06-20 DIAGNOSIS — Z818 Family history of other mental and behavioral disorders: Secondary | ICD-10-CM

## 2023-06-20 DIAGNOSIS — R739 Hyperglycemia, unspecified: Secondary | ICD-10-CM | POA: Diagnosis not present

## 2023-06-20 DIAGNOSIS — Z8249 Family history of ischemic heart disease and other diseases of the circulatory system: Secondary | ICD-10-CM

## 2023-06-20 DIAGNOSIS — Z8619 Personal history of other infectious and parasitic diseases: Secondary | ICD-10-CM

## 2023-06-20 DIAGNOSIS — N179 Acute kidney failure, unspecified: Secondary | ICD-10-CM | POA: Diagnosis present

## 2023-06-20 DIAGNOSIS — Z885 Allergy status to narcotic agent status: Secondary | ICD-10-CM

## 2023-06-20 DIAGNOSIS — H40009 Preglaucoma, unspecified, unspecified eye: Secondary | ICD-10-CM | POA: Diagnosis present

## 2023-06-20 DIAGNOSIS — Z886 Allergy status to analgesic agent status: Secondary | ICD-10-CM

## 2023-06-20 DIAGNOSIS — Z794 Long term (current) use of insulin: Secondary | ICD-10-CM

## 2023-06-20 DIAGNOSIS — E875 Hyperkalemia: Secondary | ICD-10-CM | POA: Diagnosis present

## 2023-06-20 DIAGNOSIS — E876 Hypokalemia: Secondary | ICD-10-CM | POA: Diagnosis not present

## 2023-06-20 DIAGNOSIS — E559 Vitamin D deficiency, unspecified: Secondary | ICD-10-CM | POA: Insufficient documentation

## 2023-06-20 DIAGNOSIS — R7989 Other specified abnormal findings of blood chemistry: Secondary | ICD-10-CM | POA: Diagnosis present

## 2023-06-20 DIAGNOSIS — E1169 Type 2 diabetes mellitus with other specified complication: Secondary | ICD-10-CM

## 2023-06-20 DIAGNOSIS — Z79899 Other long term (current) drug therapy: Secondary | ICD-10-CM

## 2023-06-20 DIAGNOSIS — Z720 Tobacco use: Secondary | ICD-10-CM | POA: Diagnosis present

## 2023-06-20 DIAGNOSIS — E139 Other specified diabetes mellitus without complications: Secondary | ICD-10-CM | POA: Diagnosis not present

## 2023-06-20 DIAGNOSIS — R112 Nausea with vomiting, unspecified: Secondary | ICD-10-CM | POA: Diagnosis not present

## 2023-06-20 DIAGNOSIS — Z888 Allergy status to other drugs, medicaments and biological substances status: Secondary | ICD-10-CM

## 2023-06-20 DIAGNOSIS — Z833 Family history of diabetes mellitus: Secondary | ICD-10-CM

## 2023-06-20 DIAGNOSIS — Z87891 Personal history of nicotine dependence: Secondary | ICD-10-CM

## 2023-06-20 DIAGNOSIS — E785 Hyperlipidemia, unspecified: Secondary | ICD-10-CM | POA: Diagnosis not present

## 2023-06-20 DIAGNOSIS — J45909 Unspecified asthma, uncomplicated: Secondary | ICD-10-CM | POA: Diagnosis present

## 2023-06-20 LAB — URINALYSIS, ROUTINE W REFLEX MICROSCOPIC
Bacteria, UA: NONE SEEN
Bilirubin Urine: NEGATIVE
Glucose, UA: >=500 mg/dL — AB
Ketones, ur: 80 mg/dL — AB
Leukocytes,Ua: NEGATIVE
Nitrite: NEGATIVE
Protein, ur: 100 mg/dL — AB
Specific Gravity, Urine: 1.029 (ref 1.005–1.030)
pH: 5 (ref 5.0–8.0)

## 2023-06-20 LAB — BASIC METABOLIC PANEL
Anion gap: 18 — ABNORMAL HIGH (ref 5–15)
Anion gap: 15 (ref 5–15)
BUN: 16 mg/dL (ref 6–20)
BUN: 15 mg/dL (ref 6–20)
CO2: 13 mmol/L — ABNORMAL LOW (ref 22–32)
CO2: 14 mmol/L — ABNORMAL LOW (ref 22–32)
Calcium: 10.3 mg/dL (ref 8.9–10.3)
Calcium: 10.2 mg/dL (ref 8.9–10.3)
Chloride: 105 mmol/L (ref 98–111)
Chloride: 107 mmol/L (ref 98–111)
Creatinine, Ser: 1.17 mg/dL — ABNORMAL HIGH (ref 0.44–1.00)
Creatinine, Ser: 1.03 mg/dL — ABNORMAL HIGH (ref 0.44–1.00)
GFR, Estimated: 56 mL/min — ABNORMAL LOW (ref >60)
GFR, Estimated: >60 mL/min (ref >60)
Glucose, Bld: 379 mg/dL — ABNORMAL HIGH (ref 70–99)
Glucose, Bld: 168 mg/dL — ABNORMAL HIGH (ref 70–99)
Potassium: 5.2 mmol/L — ABNORMAL HIGH (ref 3.5–5.1)
Potassium: 4.4 mmol/L (ref 3.5–5.1)
Sodium: 136 mmol/L (ref 135–145)
Sodium: 136 mmol/L (ref 135–145)

## 2023-06-20 LAB — GLUCOSE, CAPILLARY
Glucose-Capillary: 417 mg/dL — ABNORMAL HIGH (ref 70–99)
Glucose-Capillary: 360 mg/dL — ABNORMAL HIGH (ref 70–99)
Glucose-Capillary: 270 mg/dL — ABNORMAL HIGH (ref 70–99)
Glucose-Capillary: 175 mg/dL — ABNORMAL HIGH (ref 70–99)
Glucose-Capillary: 177 mg/dL — ABNORMAL HIGH (ref 70–99)
Glucose-Capillary: 166 mg/dL — ABNORMAL HIGH (ref 70–99)
Glucose-Capillary: 194 mg/dL — ABNORMAL HIGH (ref 70–99)
Glucose-Capillary: 207 mg/dL — ABNORMAL HIGH (ref 70–99)
Glucose-Capillary: 186 mg/dL — ABNORMAL HIGH (ref 70–99)

## 2023-06-20 LAB — CBC WITH DIFFERENTIAL/PLATELET
Abs Immature Granulocytes: 0.06 10*3/uL (ref 0.00–0.07)
Basophils Absolute: 0 10*3/uL (ref 0.0–0.1)
Basophils Relative: 0 %
Eosinophils Absolute: 0 10*3/uL (ref 0.0–0.5)
Eosinophils Relative: 0 %
HCT: 47.3 % — ABNORMAL HIGH (ref 36.0–46.0)
Hemoglobin: 15 g/dL (ref 12.0–15.0)
Immature Granulocytes: 1 %
Lymphocytes Relative: 11 %
Lymphs Abs: 1.1 10*3/uL (ref 0.7–4.0)
MCH: 27.7 pg (ref 26.0–34.0)
MCHC: 31.7 g/dL (ref 30.0–36.0)
MCV: 87.4 fL (ref 80.0–100.0)
Monocytes Absolute: 0.6 10*3/uL (ref 0.1–1.0)
Monocytes Relative: 7 %
Neutro Abs: 7.7 10*3/uL (ref 1.7–7.7)
Neutrophils Relative %: 81 %
Platelets: 494 10*3/uL — ABNORMAL HIGH (ref 150–400)
RBC: 5.41 MIL/uL — ABNORMAL HIGH (ref 3.87–5.11)
RDW: 14.6 % (ref 11.5–15.5)
WBC: 9.4 10*3/uL (ref 4.0–10.5)
nRBC: 0 % (ref 0.0–0.2)

## 2023-06-20 LAB — COMPREHENSIVE METABOLIC PANEL
ALT: 24 U/L (ref 0–44)
AST: 14 U/L — ABNORMAL LOW (ref 15–41)
Albumin: 4.6 g/dL (ref 3.5–5.0)
Alkaline Phosphatase: 130 U/L — ABNORMAL HIGH (ref 38–126)
Anion gap: 20 — ABNORMAL HIGH (ref 5–15)
BUN: 17 mg/dL (ref 6–20)
CO2: 13 mmol/L — ABNORMAL LOW (ref 22–32)
Calcium: 9.9 mg/dL (ref 8.9–10.3)
Chloride: 97 mmol/L — ABNORMAL LOW (ref 98–111)
Creatinine, Ser: 1.36 mg/dL — ABNORMAL HIGH (ref 0.44–1.00)
GFR, Estimated: 47 mL/min — ABNORMAL LOW (ref >60)
Glucose, Bld: 729 mg/dL (ref 70–99)
Potassium: 4.9 mmol/L (ref 3.5–5.1)
Sodium: 130 mmol/L — ABNORMAL LOW (ref 135–145)
Total Bilirubin: 1.2 mg/dL (ref 0.3–1.2)
Total Protein: 9.5 g/dL — ABNORMAL HIGH (ref 6.5–8.1)

## 2023-06-20 LAB — CBG MONITORING, ED
Glucose-Capillary: >600 mg/dL (ref 70–99)
Glucose-Capillary: >600 mg/dL (ref 70–99)
Glucose-Capillary: 505 mg/dL (ref 70–99)
Glucose-Capillary: 583 mg/dL (ref 70–99)

## 2023-06-20 LAB — BLOOD GAS, VENOUS
Acid-base deficit: 14.5 mmol/L — ABNORMAL HIGH (ref 0.0–2.0)
Bicarbonate: 14 mmol/L — ABNORMAL LOW (ref 20.0–28.0)
O2 Saturation: 51.5 %
Patient temperature: 37
pCO2, Ven: 41 mmHg — ABNORMAL LOW (ref 44–60)
pH, Ven: 7.14 — CL (ref 7.25–7.43)
pO2, Ven: 31 mmHg — CL (ref 32–45)

## 2023-06-20 LAB — BETA-HYDROXYBUTYRIC ACID
Beta-Hydroxybutyric Acid: 5.77 mmol/L — ABNORMAL HIGH (ref 0.05–0.27)
Beta-Hydroxybutyric Acid: 2.58 mmol/L — ABNORMAL HIGH (ref 0.05–0.27)

## 2023-06-20 LAB — HCG, SERUM, QUALITATIVE: Preg, Serum: NEGATIVE

## 2023-06-20 MED ORDER — CHLORHEXIDINE GLUCONATE CLOTH 2 % EX PADS
6.0000 | MEDICATED_PAD | Freq: Every day | CUTANEOUS | Status: DC
  Administered 2023-06-20 – 2023-06-21 (×2): 6 via TOPICAL

## 2023-06-20 MED ORDER — POTASSIUM CHLORIDE 10 MEQ/100ML IV SOLN
10.0000 meq | INTRAVENOUS | Status: AC
  Administered 2023-06-20 (×2): 10 meq via INTRAVENOUS
  Filled 2023-06-20 (×2): qty 100

## 2023-06-20 MED ORDER — METFORMIN HCL ER 500 MG PO TB24: 500.0000 mg | ORAL_TABLET | Freq: Two times a day (BID) | ORAL | Status: DC

## 2023-06-20 MED ORDER — ATORVASTATIN CALCIUM 40 MG PO TABS
40.0000 mg | ORAL_TABLET | Freq: Every day | ORAL | Status: DC
  Administered 2023-06-21 – 2023-06-24 (×4): 40 mg via ORAL
  Filled 2023-06-20 (×4): qty 1

## 2023-06-20 MED ORDER — METOPROLOL TARTRATE 5 MG/5ML IV SOLN
5.0000 mg | Freq: Once | INTRAVENOUS | Status: AC
  Administered 2023-06-20: 5 mg via INTRAVENOUS
  Filled 2023-06-20: qty 5

## 2023-06-20 MED ORDER — DEXTROSE 50 % IV SOLN: 0.0000 mL | INTRAVENOUS | Status: DC | PRN

## 2023-06-20 MED ORDER — ORAL CARE MOUTH RINSE: 15.0000 mL | OROMUCOSAL | Status: DC | PRN

## 2023-06-20 MED ORDER — PANTOPRAZOLE SODIUM 40 MG IV SOLR
40.0000 mg | Freq: Once | INTRAVENOUS | Status: AC
  Administered 2023-06-20: 40 mg via INTRAVENOUS
  Filled 2023-06-20: qty 10

## 2023-06-20 MED ORDER — AMLODIPINE BESYLATE 10 MG PO TABS
10.0000 mg | ORAL_TABLET | Freq: Every day | ORAL | Status: DC
  Administered 2023-06-21 – 2023-06-24 (×4): 10 mg via ORAL
  Filled 2023-06-20 (×4): qty 1

## 2023-06-20 MED ORDER — LACTATED RINGERS IV BOLUS
20.0000 mL/kg | Freq: Once | INTRAVENOUS | Status: AC
  Administered 2023-06-20: 1914 mL via INTRAVENOUS

## 2023-06-20 MED ORDER — LACTATED RINGERS IV SOLN: INTRAVENOUS | Status: DC

## 2023-06-20 MED ORDER — ENOXAPARIN SODIUM 40 MG/0.4ML IJ SOSY
40.0000 mg | PREFILLED_SYRINGE | INTRAMUSCULAR | Status: DC
  Administered 2023-06-20 – 2023-06-23 (×4): 40 mg via SUBCUTANEOUS
  Filled 2023-06-20 (×4): qty 0.4

## 2023-06-20 MED ORDER — DEXTROSE IN LACTATED RINGERS 5 % IV SOLN: INTRAVENOUS | Status: DC

## 2023-06-20 MED ORDER — PANTOPRAZOLE SODIUM 40 MG PO TBEC
40.0000 mg | DELAYED_RELEASE_TABLET | Freq: Every day | ORAL | Status: DC
  Administered 2023-06-21 – 2023-06-24 (×4): 40 mg via ORAL
  Filled 2023-06-20 (×4): qty 1

## 2023-06-20 MED ORDER — ACETAMINOPHEN 325 MG PO TABS: 650.0000 mg | ORAL_TABLET | Freq: Four times a day (QID) | ORAL | Status: DC | PRN

## 2023-06-20 MED ORDER — SODIUM CHLORIDE 0.9 % IV BOLUS
1000.0000 mL | Freq: Once | INTRAVENOUS | Status: AC
  Administered 2023-06-20: 1000 mL via INTRAVENOUS

## 2023-06-20 MED ORDER — ONDANSETRON HCL 4 MG PO TABS: 4.0000 mg | ORAL_TABLET | Freq: Four times a day (QID) | ORAL | Status: DC | PRN

## 2023-06-20 MED ORDER — ACETAMINOPHEN 650 MG RE SUPP: 650.0000 mg | Freq: Four times a day (QID) | RECTAL | Status: DC | PRN

## 2023-06-20 MED ORDER — GABAPENTIN 300 MG PO CAPS
300.0000 mg | ORAL_CAPSULE | Freq: Three times a day (TID) | ORAL | Status: DC
  Administered 2023-06-20 – 2023-06-24 (×12): 300 mg via ORAL
  Filled 2023-06-20 (×12): qty 1

## 2023-06-20 MED ORDER — ONDANSETRON HCL 4 MG/2ML IJ SOLN
4.0000 mg | Freq: Four times a day (QID) | INTRAMUSCULAR | Status: DC | PRN
  Administered 2023-06-20: 4 mg via INTRAVENOUS
  Filled 2023-06-20: qty 2

## 2023-06-20 MED ORDER — INSULIN REGULAR(HUMAN) IN NACL 100-0.9 UT/100ML-% IV SOLN
INTRAVENOUS | Status: DC
  Administered 2023-06-20: 13 [IU]/h via INTRAVENOUS
  Administered 2023-06-20: 6.5 [IU]/h via INTRAVENOUS
  Filled 2023-06-20 (×2): qty 100

## 2023-06-20 MED ORDER — METOPROLOL TARTRATE 50 MG PO TABS
50.0000 mg | ORAL_TABLET | Freq: Two times a day (BID) | ORAL | Status: DC
  Administered 2023-06-20 – 2023-06-24 (×8): 50 mg via ORAL
  Filled 2023-06-20: qty 1
  Filled 2023-06-20: qty 2
  Filled 2023-06-20 (×4): qty 1
  Filled 2023-06-20: qty 2
  Filled 2023-06-20: qty 1

## 2023-06-20 MED ORDER — AMLODIPINE BESYLATE 10 MG PO TABS: 10.0000 mg | ORAL_TABLET | Freq: Every day | ORAL | Status: DC

## 2023-06-20 MED ORDER — ATORVASTATIN CALCIUM 40 MG PO TABS: 40.0000 mg | ORAL_TABLET | Freq: Every day | ORAL | Status: DC

## 2023-06-20 NOTE — ED Notes (Signed)
ED TO INPATIENT HANDOFF REPORT  Name/Age/Gender Whitney Benton 52 y.o. female  Code Status    Code Status Orders  (From admission, onward)           Start     Ordered   06/20/23 1405  Full code  Continuous       Question:  By:  Answer:  Consent: discussion documented in EHR   06/20/23 1404           Code Status History     Date Active Date Inactive Code Status Order ID Comments User Context   04/30/2023 0526 05/03/2023 1727 Full Code [id number]  Angie Fava, DO ED   01/23/2021 1408 01/26/2021 2230 Full Code [id number]  Eliezer Bottom, MD ED   10/02/2019 2126 10/04/2019 1753 Full Code [id number]  John Giovanni, MD ED   11/14/2014 0323 11/14/2014 2117 Full Code [id number]  Lorretta Harp, MD Inpatient       Home/SNF/Other Home  Chief Complaint DKA, 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]  Level of Care/Admitting Diagnosis ED Disposition     ED Disposition  Admit   Condition  --   Comment  Hospital Area: [facility] [[id number]]  Level of Care: Stepdown [14]  Admit to SDU based on following criteria: Severe physiological/psychological symptoms:  Any diagnosis requiring assessment & intervention at least every 4 hours on an ongoing basis to obtain desired patient outcomes including stability and rehabilitation  May place patient in observation at [facility] or [facility] if equivalent level of care is available:: No  Covid Evaluation: Asymptomatic - no recent exposure (last 10 days) testing not required  Diagnosis: DKA, type 1 [facility]) [[id number]]  Admitting Physician: Bobette Mo [[id number]]  Attending Physician: Bobette Mo [phone]          Medical History Past Medical History:  Diagnosis Date   Allergy    Anxiety    Arthritis    Asthma    Depression    Diabetes mellitus without complication (HCC)    GERD (gastroesophageal reflux disease)    Glaucoma suspect    Hyperlipidemia    Trichomonas infection     Allergies Allergies  Allergen  Reactions   Aspirin Hives   Trulicity [Dulaglutide] Other (See Comments)    DC'd due to chronic gastric and abdominal pain with nausea   Oxycodone Nausea And Vomiting    IV Location/Drains/Wounds Patient Lines/Drains/Airways Status     Active Line/Drains/Airways     Name Placement date Placement time Site Days   Peripheral IV 06/20/23 20 G Left Antecubital 06/20/23  1110  Antecubital  less than 1   Peripheral IV 06/20/23 20 G Posterior;Right Hand 06/20/23  1346  Hand  less than 1            Labs/Imaging Results for orders placed or performed during the hospital encounter of 06/20/23 (from the past 48 hour(s))  CBG monitoring, ED     Status: Abnormal   Collection Time: 06/20/23 10:43 AM  Result Value Ref Range   Glucose-Capillary >600 (HH) 70 - 99 mg/dL    Comment: Glucose reference range applies only to samples taken after fasting for at least 8 hours.  Urinalysis, Routine w reflex microscopic -Urine, Clean Catch     Status: Abnormal   Collection Time: 06/20/23 11:13 AM  Result Value Ref Range   Color, Urine STRAW (A) YELLOW   APPearance CLEAR CLEAR   Specific Gravity, Urine 1.029 1.005 - 1.030   pH 5.0 5.0 -  8.0   Glucose, UA >=500 (A) NEGATIVE mg/dL   Hgb urine dipstick SMALL (A) NEGATIVE   Bilirubin Urine NEGATIVE NEGATIVE   Ketones, ur 80 (A) NEGATIVE mg/dL   Protein, ur 147 (A) NEGATIVE mg/dL   Nitrite NEGATIVE NEGATIVE   Leukocytes,Ua NEGATIVE NEGATIVE   RBC / HPF 0-5 0 - 5 RBC/hpf   WBC, UA 0-5 0 - 5 WBC/hpf   Bacteria, UA NONE SEEN NONE SEEN   Squamous Epithelial / HPF 0-5 0 - 5 /HPF   Mucus PRESENT     Comment: Performed at [facility], [address], [place], [place] [address]  CBC with Differential     Status: Abnormal   Collection Time: 06/20/23 11:22 AM  Result Value Ref Range   WBC 9.4 4.0 - 10.5 K/uL   RBC 5.41 (H) 3.87 - 5.11 MIL/uL   Hemoglobin 15.0 12.0 - 15.0 g/dL   HCT 21.3 (H) 08.6 - 57.8 %   MCV 87.4 80.0 - 100.0 fL    MCH 27.7 26.0 - 34.0 pg   MCHC 31.7 30.0 - 36.0 g/dL   RDW 46.9 62.9 - 52.8 %   Platelets 494 (H) 150 - 400 K/uL   nRBC 0.0 0.0 - 0.2 %   Neutrophils Relative % 81 %   Neutro Abs 7.7 1.7 - 7.7 K/uL   Lymphocytes Relative 11 %   Lymphs Abs 1.1 0.7 - 4.0 K/uL   Monocytes Relative 7 %   Monocytes Absolute 0.6 0.1 - 1.0 K/uL   Eosinophils Relative 0 %   Eosinophils Absolute 0.0 0.0 - 0.5 K/uL   Basophils Relative 0 %   Basophils Absolute 0.0 0.0 - 0.1 K/uL   Immature Granulocytes 1 %   Abs Immature Granulocytes 0.06 0.00 - 0.07 K/uL    Comment: Performed at [facility], [address], [place], [place] [address]  Comprehensive metabolic panel     Status: Abnormal   Collection Time: 06/20/23 11:22 AM  Result Value Ref Range   Sodium 130 (L) 135 - 145 mmol/L   Potassium 4.9 3.5 - 5.1 mmol/L   Chloride 97 (L) 98 - 111 mmol/L   CO2 13 (L) 22 - 32 mmol/L   Glucose, Bld 729 (HH) 70 - 99 mg/dL    Comment: CRITICAL RESULT CALLED TO, READ BACK BY AND VERIFIED WITH RN Glynn Octave AT 1221 06/20/23  CRUICKSHANK A Glucose reference range applies only to samples taken after fasting for at least 8 hours.    BUN 17 6 - 20 mg/dL   Creatinine, Ser 4.01 (H) 0.44 - 1.00 mg/dL   Calcium 9.9 8.9 - 02.7 mg/dL   Total Protein 9.5 (H) 6.5 - 8.1 g/dL   Albumin 4.6 3.5 - 5.0 g/dL   AST 14 (L) 15 - 41 U/L   ALT 24 0 - 44 U/L   Alkaline Phosphatase 130 (H) 38 - 126 U/L   Total Bilirubin 1.2 0.3 - 1.2 mg/dL   GFR, Estimated 47 (L) >60 mL/min    Comment: (NOTE) Calculated using the CKD-EPI Creatinine Equation (2021)    Anion gap 20 (H) 5 - 15    Comment: Performed at [facility], [address] [place], [place] [address]  hCG, serum, qualitative     Status: None   Collection Time: 06/20/23 11:22 AM  Result Value Ref Range   Preg, Serum NEGATIVE NEGATIVE    Comment:        THE SENSITIVITY OF THIS  METHODOLOGY IS >10 mIU/mL. Performed at [facility], [address] [place], [place] [address]   Blood gas, venous     Status: Abnormal   Collection Time: 06/20/23 11:22 AM  Result Value Ref Range   pH, Ven 7.14 (LL) 7.25 - 7.43    Comment: CRITICAL RESULT CALLED TO, READ BACK BY AND VERIFIED WITH: RN L SINCLAIR AT 1135 06/20/23 CRUICKSHANK A    pCO2, Ven 41 (L) 44 - 60 mmHg   pO2, Ven 31 (LL) 32 - 45 mmHg    Comment: CRITICAL RESULT CALLED TO, READ BACK BY AND VERIFIED WITH: RN L SINCLAIR AT 1135 06/20/23 CRUICKSHANK A    Bicarbonate 14.0 (L) 20.0 - 28.0 mmol/L   Acid-base deficit 14.5 (H) 0.0 - 2.0 mmol/L   O2 Saturation 51.5 %   Patient temperature 37.0     Comment: Performed at [facility], [address], [place], [place] [address]  Beta-hydroxybutyric acid     Status: Abnormal   Collection Time: 06/20/23 11:22 AM  Result Value Ref Range   Beta-Hydroxybutyric Acid 5.77 (H) 0.05 - 0.27 mmol/L    Comment: RESULT CONFIRMED BY MANUAL DILUTION Performed at [facility], [address], [place], [place] [address]   CBG monitoring, ED     Status: Abnormal   Collection Time: 06/20/23  1:10 PM  Result Value Ref Range   Glucose-Capillary >600 (HH) 70 - 99 mg/dL    Comment: Glucose reference range applies only to samples taken after fasting for at least 8 hours.  CBG monitoring, ED     Status: Abnormal   Collection Time: 06/20/23  1:44 PM  Result Value Ref Range   Glucose-Capillary 583 (HH) 70 - 99 mg/dL    Comment: Glucose reference range applies only to samples taken after fasting for at least 8 hours.   Comment 1 Notify RN    No results found.  Pending Labs Unresulted Labs (From admission, onward)     Start     Ordered   06/21/23 0500  CBC  Tomorrow morning,   R        06/20/23 1404   06/21/23 0500  Comprehensive metabolic panel  Tomorrow morning,   R        06/20/23 1404   06/20/23 1900  Beta-hydroxybutyric acid  (Diabetes Ketoacidosis (DKA))  Now then every 8 hours,   R  (with TIMED occurrences)      06/20/23 1407   06/20/23 1500  Basic metabolic panel  (Diabetes Ketoacidosis (DKA))  STAT Now then every 4 hours ,   R (with STAT occurrences)      06/20/23 1407   06/20/23 1404  HIV Antibody (routine testing w rflx)  (HIV Antibody (Routine testing w reflex) panel)  Once,   R        06/20/23 1404            Vitals/Pain Today's Vitals   06/20/23 1022 06/20/23 1024 06/20/23 1236  BP:  (!) 134/90 101/89  Pulse:  (!) 121 (!) 120  Resp:  18 19  Temp:  98.5 F (36.9 C) 98.2 F (36.8 C)  TempSrc:  Oral Oral  SpO2:  99% 97%  Weight: 95.7 kg    Height: 5\' 3"  (1.6 m)    PainSc: 0-No pain      Isolation Precautions No active isolations  Medications Medications  insulin regular, human (MYXREDLIN) 100 units/ 100 mL infusion (17 Units/hr Intravenous Rate/Dose Change 06/20/23 1345)  lactated ringers infusion (has no administration in time range)  dextrose 5 % in lactated ringers infusion (has no administration in time range)  dextrose 50 % solution 0-50 mL (has no administration in time range)  potassium chloride 10 mEq in 100 mL IVPB (10 mEq Intravenous New Bag/Given 06/20/23 1327)  enoxaparin (LOVENOX) injection 40 mg (has no administration in time range)  acetaminophen (TYLENOL) tablet 650 mg (has no administration in time range)    Or  acetaminophen (TYLENOL) suppository 650 mg (has no administration in time range)  ondansetron (ZOFRAN) tablet 4 mg (has no administration in time range)    Or  ondansetron (ZOFRAN) injection 4 mg (has no administration in time range)  pantoprazole (PROTONIX) injection 40 mg (has no administration in time range)  sodium chloride 0.9 % bolus 1,000 mL (0 mLs Intravenous Stopped 06/20/23 1221)  lactated ringers bolus 1,914 mL (1,914 mLs Intravenous New Bag/Given 06/20/23 1307)    Mobility walks

## 2023-06-20 NOTE — H&P (Signed)
History and Physical    Patient: Whitney Benton OZH:[id number] DOB: 11-16-1971 DOA: 06/20/2023 DOS: the patient was seen and examined on 06/20/2023 PCP: Marcine Matar, MD  Patient coming from: Home  Chief Complaint:  Chief Complaint  Patient presents with   Nausea   Emesis   HPI: Whitney Benton is a 52 y.o. female with medical history significant of seasonal allergies, anxiety, depression, osteoarthritis, asthma, GERD, glaucoma suspect, hyperlipidemia, trichomoniasis, type 1 diabetes who is coming to the emergency department complaints of hyperglycemia, polyuria, multiple episodes of nausea and vomiting for the past 5 to 6 days.  She stated she has been using her insulin as prescribed. He denied fever, chills, rhinorrhea, sore throat, wheezing or hemoptysis.  No chest pain, palpitations, diaphoresis, PND, orthopnea or pitting edema of the lower extremities.  No abdominal pain, nausea, emesis, diarrhea, constipation, melena or hematochezia.  No flank pain, dysuria, frequency or hematuria.    Lab work: CBC showed a white count of 9.4, hemoglobin 15.0 g/dL and platelets 629.  Venous blood gas showed a pH of 7.14, pCO2 41 and pO2 of 31 mmHg.  Bicarbonate was 14.0 and acid-base deficit 14.5 mmol/L.  Beta hydroxybutyric acid 5.77 mmol/L.  Serum pregnancy test was negative.  CMP showed sodium 130, potassium 4.9, chloride 97 and CO2 13 mmol/L with an anion gap of 20.  Glucose 729, BUN 17 and creatinine 1.36 mg deciliter.  Total protein was 9.5 and albumin 4.6 g/dL.  AST 14, ALT 24 and alkaline phosphatase 130 units/L.  Normal bilirubin level.   ED course: Initial vital signs were temperature 98.5 F, pulse 121, respiration 18, BP 134/90 mmHg O2 sat 99% on room air.  The patient was started on an insulin infusion.  She received 1000 mL of normal saline bolus and 1914 mL of LR bolus.  Review of Systems: As mentioned in the history of present illness. All other systems reviewed and are negative. Past  Medical History:  Diagnosis Date   Allergy    Anxiety    Arthritis    Asthma    Depression    Diabetes mellitus without complication (HCC)    GERD (gastroesophageal reflux disease)    Glaucoma suspect    Hyperlipidemia    Trichomonas infection    Past Surgical History:  Procedure Laterality Date   CESAREAN SECTION     UPPER GASTROINTESTINAL ENDOSCOPY     VAGINAL HYSTERECTOMY  12/23/2004   Fibroids, menorrhagia, benign pathology   Social History:  reports that she quit smoking about 3 years ago. Her smoking use included cigarettes. She has a 2.00 pack-year smoking history. She has never used smokeless tobacco. She reports that she does not currently use alcohol. She reports current drug use. Drug: Marijuana.  Allergies  Allergen Reactions   Aspirin Hives   Trulicity [Dulaglutide] Other (See Comments)    DC'd due to chronic gastric and abdominal pain with nausea   Oxycodone Nausea And Vomiting    Family History  Problem Relation Age of Onset   Diabetes Mother    Mental illness Mother    Depression Mother    Hypertension Mother    Colon cancer Father 47   Hypertension Father    Diabetes Father    Colon cancer Paternal Grandmother    Esophageal cancer Neg Hx    Stomach cancer Neg Hx    Rectal cancer Neg Hx     Prior to Admission medications   Medication Sig Start Date End Date Taking? Authorizing Provider  Accu-Chek Softclix Lancets lancets Use to check blood sugar 3 times daily 06/09/23   Marcine Matar, MD  albuterol (VENTOLIN HFA) 108 (90 Base) MCG/ACT inhaler Inhale 2 puffs into the lungs every 6 (six) hours as needed for wheezing or shortness of breath (Cough). Patient taking differently: Inhale 2 puffs into the lungs every 6 (six) hours as needed for wheezing or shortness of breath (or coughing). 02/26/23   Marcine Matar, MD  amLODipine (NORVASC) 10 MG tablet Take 1 tablet (10 mg total) by mouth daily. 05/07/23   Anders Simmonds, PA-C  atorvastatin  (LIPITOR) 40 MG tablet Take 1 tablet (40 mg total) by mouth daily. 05/07/23   Anders Simmonds, PA-C  Blood Glucose Monitoring Suppl (ACCU-CHEK GUIDE) w/Device KIT Use to check blood sugar 3 times daily. 06/09/23   Marcine Matar, MD  busPIRone (BUSPAR) 15 MG tablet Take 1 tablet (15 mg total) by mouth 3 (three) times daily. 06/17/23   Shanna Cisco, NP  Continuous Glucose Sensor (FREESTYLE LIBRE 3 SENSOR) MISC Use to check blood sugar continuously throughout the day. Replace sensors once every 14 days. Dx E11.65 Patient not taking: Reported on 06/09/2023 05/07/23   Marcine Matar, MD  cycloSPORINE (RESTASIS) 0.05 % ophthalmic emulsion Apply 1 drop into both eyes twice a day 09/19/21     DULoxetine (CYMBALTA) 20 MG capsule Take 1 capsule (20 mg total) by mouth in the morning. 06/17/23   Shanna Cisco, NP  DULoxetine (CYMBALTA) 60 MG capsule Take 1 capsule (60 mg total) by mouth daily. 06/17/23   Shanna Cisco, NP  Erenumab-aooe (AIMOVIG) 140 MG/ML SOAJ Inject 140 mg into the skin every 28 (twenty-eight) days. 06/05/23   Drema Dallas, DO  gabapentin (NEURONTIN) 300 MG capsule Take 1 capsule (300 mg total) by mouth 3 (three) times daily. 06/17/23   Toy Cookey E, NP  glucose blood (ACCU-CHEK GUIDE) test strip Use to check blood sugar 3 times daily 06/09/23   Marcine Matar, MD  HYDROcodone-acetaminophen (NORCO/VICODIN) 5-325 MG tablet Take 1 tablet by mouth every 6 (six) hours as needed for moderate pain or severe pain.    [provider]  hydrOXYzine (ATARAX) 25 MG tablet TAKE 1 TABLET (25 MG TOTAL) BY MOUTH 3 (THREE) TIMES DAILY AS NEEDED. 06/17/23   Toy Cookey E, NP  insulin aspart (NOVOLOG FLEXPEN) 100 UNIT/ML FlexPen Inject 30 Units into the skin 3 (three) times daily with meals. 05/07/23   Anders Simmonds, PA-C  insulin glargine, 1 Unit Dial, (TOUJEO SOLOSTAR) 300 UNIT/ML Solostar Pen Inject 70 Units into the skin daily. 06/09/23   Marcine Matar, MD   Insulin Pen Needle (PEN NEEDLES 3/16") 31G X 5 MM MISC Use as directed with insulin pen 05/07/23   Georgian Co M, PA-C  metFORMIN (GLUCOPHAGE-XR) 500 MG 24 hr tablet Take 1 tablet (500 mg total) by mouth 2 (two) times daily. 05/07/23   Anders Simmonds, PA-C  methylPREDNISolone (MEDROL DOSEPAK) 4 MG TBPK tablet Take as directed for 6 days 05/08/23   Standiford, Jenelle Mages, DPM  metoprolol tartrate (LOPRESSOR) 50 MG tablet Take 1 tablet (50 mg total) by mouth 2 (two) times daily. 05/07/23   Anders Simmonds, PA-C  Multiple Vitamins-Minerals (EYE VITAMINS PO) Take 1 tablet by mouth daily.    [provider]  OLANZapine-Samidorphan (LYBALVI) 20-10 MG TABS Take 20 mg by mouth at bedtime. 06/17/23   Shanna Cisco, NP  omeprazole (PRILOSEC) 40 MG capsule TAKE  1 CAPSULE BY MOUTH ONCE DAILY 30 MINUTES BEFORE BREAKFAST Patient taking differently: Take 40 mg by mouth daily before breakfast. 03/10/23   Meredith Pel, NP  ondansetron (ZOFRAN-ODT) 4 MG disintegrating tablet Take 1 tablet (4 mg total) by mouth every 8 (eight) hours as needed for nausea or vomiting. 05/19/23   Smoot, Shawn Route, PA-C  polyethylene glycol (MIRALAX / GLYCOLAX) 17 g packet Take 17 g by mouth daily as needed for mild constipation or moderate constipation. 04/03/23   [provider]  Sodium Fluoride (DENTA 5000 PLUS DT) Place 1 application  onto teeth See admin instructions. Brush 1 application onto the teeth 2 times a day as directed in place of current toothpaste, then spit out. Do not eat, drink, or rinse after using.    [provider]  SUMAtriptan (IMITREX) 100 MG tablet TAKE 1 TABLET EARLIEST ONSET OF MIGRAINE. MAY REPEAT IN 2 HOURS IF HEADACHE PERSISTS OR RECURS. MAXIMUM 2 TABLETS IN 24 HOURS. Patient taking differently: Take 100 mg by mouth See admin instructions. Take 100 mg by mouth at the earliest onset of migraine- may repeat once in two hours if headache recurs or persists (max of 2 tablets/24  hrs) 03/11/23 03/10/24  Drema Dallas, DO  triamcinolone cream (KENALOG) 0.1 % Apply 1 Application topically 2 (two) times daily. Patient taking differently: Apply 1 Application topically 2 (two) times daily as needed (for irritation on the legs). 08/12/22   Marcine Matar, MD  zolpidem (AMBIEN) 10 MG tablet Take 1 tablet (10 mg total) by mouth at bedtime as needed for sleep. 06/17/23   Shanna Cisco, NP  cetirizine (ZYRTEC) 10 MG tablet Take 1 tablet (10 mg total) by mouth daily. Patient not taking: No sig reported 03/31/19 03/03/20  Hoy Register, MD    Physical Exam: Vitals:   06/20/23 1022 06/20/23 1024 06/20/23 1236  BP:  (!) 134/90 101/89  Pulse:  (!) 121 (!) 120  Resp:  18 19  Temp:  98.5 F (36.9 C) 98.2 F (36.8 C)  TempSrc:  Oral Oral  SpO2:  99% 97%  Weight: 95.7 kg    Height: 5\' 3"  (1.6 m)     Physical Exam Vitals and nursing note reviewed.  Constitutional:      General: She is awake. She is not in acute distress.    Appearance: Normal appearance. She is ill-appearing.  HENT:     Head: Normocephalic.     Nose: No rhinorrhea.     Mouth/Throat:     Mouth: Mucous membranes are dry.  Eyes:     General: No scleral icterus.    Pupils: Pupils are equal, round, and reactive to light.  Neck:     Vascular: No JVD.  Cardiovascular:     Rate and Rhythm: Regular rhythm. Tachycardia present.     Heart sounds: S1 normal and S2 normal.  Pulmonary:     Effort: Pulmonary effort is normal.     Breath sounds: Normal breath sounds. No wheezing, rhonchi or rales.  Abdominal:     General: Bowel sounds are normal. There is no distension.     Palpations: Abdomen is soft.     Tenderness: There is no abdominal tenderness. There is no guarding.  Musculoskeletal:     Cervical back: Neck supple.     Right lower leg: No edema.     Left lower leg: No edema.  Skin:    General: Skin is warm and dry.  Neurological:  General: No focal deficit present.     Mental Status: She is  alert and oriented to person, place, and time.  Psychiatric:        Mood and Affect: Mood normal.        Behavior: Behavior normal. Behavior is cooperative.     Data Reviewed:  There are no new results to review at this time.  Assessment and Plan: Principal Problem:   DKA, type 1 (HCC) Observation/stepdown. Keep NPO. Continue IV fluids. Continue insulin infusion. Monitor CBG closely. BMP every 4 hours. BHA every 8 hours. Replace electrolytes as needed. Consult diabetes coordinator. Transition to SQ insulin per Endo tool.  Active Problems:   AKI (acute kidney injury) (HCC) Continue IV fluids. Avoid hypotension. Avoid nephrotoxins. Monitor intake and output. Monitor renal function and electrolytes.    Depression Continue duloxetine once med rec performed.    Generalized anxiety disorder Continue buspirone once med rec done.    Tobacco abuse Stated she does not need nicotine replacement therapy.    Abnormal LFTs In the setting of dehydration. Continue current treatment and recheck in AM.    Hyperlipidemia Continue atorvastatin 40 mg p.o. daily.    Essential hypertension Continue metoprolol 50 mg p.o. twice daily. Will hold amlodipine today due to dehydration/tachycardia.    Advance Care Planning:   Code Status: Full Code   Consults:   Family Communication:   Severity of Illness: The appropriate patient status for this patient is OBSERVATION. Observation status is judged to be reasonable and necessary in order to provide the required intensity of service to ensure the patient's safety. The patient's presenting symptoms, physical exam findings, and initial radiographic and laboratory data in the context of their medical condition is felt to place them at decreased risk for further clinical deterioration. Furthermore, it is anticipated that the patient will be medically stable for discharge from the hospital within 2 midnights of admission.   Author: Bobette Mo, MD 06/20/2023 1:56 PM  For on call review [email or web address].  This document was prepared using Dragon voice recognition software and may contain some unintended transcription errors.

## 2023-06-20 NOTE — Inpatient Diabetes Management (Signed)
Inpatient Diabetes Program Recommendations  AACE/ADA: New Consensus Statement on Inpatient Glycemic Control (2015)  Target Ranges:  Prepandial:   less than 140 mg/dL      Peak postprandial:   less than 180 mg/dL (1-2 hours)      Critically ill patients:  140 - 180 mg/dL   Lab Results  Component Value Date   GLUCAP 505 (HH) 06/20/2023   HGBA1C 13.0 (H) 04/30/2023    Review of Glycemic Control  Latest Reference Range & Units 06/20/23 10:43 06/20/23 13:10 06/20/23 13:44 06/20/23 14:17  Glucose-Capillary 70 - 99 mg/dL >161 (HH) >096 (HH) 045 (HH) 505 (HH)   Diabetes history: DM 1 Outpatient Diabetes medications:  Novolog 30 units tid with meals Lantus 25 units in the AM and Lantus 45 units in the PM Current orders for Inpatient glycemic control:  IV insulin- DKA orders  Inpatient Diabetes Program Recommendations:    Note admit for nausea, vomiting, and DKA.  Spoke with patient by phone.  She states that she has not been able to eat since Saturday.  She was recently changed to [facility] however she has not been able to get yet, so she is still taking Lantus/Novolog.  He does have insulin pump and supplies and states she saw her MD this morning and they are helping her get set-up to restart ASAP.  Her PCP also gave her a CGM to start once she leaves the hospital.  We discussed importance of glycemic control and close follow-up.  Will follow.   -When patient is ready for transition of insulin drip and acidosis is cleared, consider Semglee 35 units (1/2 of home dose), Novolog 6 units tid with meals and Novolog sensitive tid with meals and HS.    Thanks,  Beryl Meager, RN, BC-ADM Inpatient Diabetes Coordinator Pager [phone]  (8a-5p)

## 2023-06-20 NOTE — ED Triage Notes (Signed)
Patient brought in by EMS due to possible DKA with nausea and vomiting. Went to PCP for not eating and drinking and feeling sick since Saturday. Patient's CBG was 512 with EMS. Pt is type 1 diabetic. Was given 4mg  zofran in route via EMS.

## 2023-06-20 NOTE — ED Notes (Signed)
ED TO INPATIENT HANDOFF REPORT  Name/Age/Gender Whitney Benton 52 y.o. female  Code Status Code Status History     Date Active Date Inactive Code Status Order ID Comments User Context   04/30/2023 0526 05/03/2023 1727 Full Code [id number]  Angie Fava, DO ED   01/23/2021 1408 01/26/2021 2230 Full Code [id number]  Eliezer Bottom, MD ED   10/02/2019 2126 10/04/2019 1753 Full Code [id number]  John Giovanni, MD ED   11/14/2014 0323 11/14/2014 2117 Full Code [id number]  Lorretta Harp, MD Inpatient    Questions for Most Recent Historical Code Status (Order [id number])     Question Answer   By: Consent: discussion documented in EHR            Home/SNF/Other Home  Chief Complaint DKA, 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]  Level of Care/Admitting Diagnosis ED Disposition     ED Disposition  Admit   Condition  --   Comment  Hospital Area: [facility] [[id number]]  Level of Care: Stepdown [14]  Admit to SDU based on following criteria: Severe physiological/psychological symptoms:  Any diagnosis requiring assessment & intervention at least every 4 hours on an ongoing basis to obtain desired patient outcomes including stability and rehabilitation  May place patient in observation at [facility] or [facility] if equivalent level of care is available:: No  Covid Evaluation: Asymptomatic - no recent exposure (last 10 days) testing not required  Diagnosis: DKA, type 1 [facility]) [[id number]]  Admitting Physician: Bobette Mo [[id number]]  Attending Physician: Bobette Mo [id number]          Medical History Past Medical History:  Diagnosis Date   Allergy    Anxiety    Arthritis    Asthma    Depression    Diabetes mellitus without complication (HCC)    GERD (gastroesophageal reflux disease)    Glaucoma suspect    Hyperlipidemia    Trichomonas infection     Allergies Allergies  Allergen Reactions   Aspirin Hives   Trulicity [Dulaglutide] Other (See Comments)     DC'd due to chronic gastric and abdominal pain with nausea   Oxycodone Nausea And Vomiting    IV Location/Drains/Wounds Patient Lines/Drains/Airways Status     Active Line/Drains/Airways     Name Placement date Placement time Site Days   Peripheral IV 06/20/23 20 G Left Antecubital 06/20/23  1110  Antecubital  less than 1   Peripheral IV 06/20/23 20 G Posterior;Right Hand 06/20/23  1346  Hand  less than 1            Labs/Imaging Results for orders placed or performed during the hospital encounter of 06/20/23 (from the past 48 hour(s))  CBG monitoring, ED     Status: Abnormal   Collection Time: 06/20/23 10:43 AM  Result Value Ref Range   Glucose-Capillary >600 (HH) 70 - 99 mg/dL    Comment: Glucose reference range applies only to samples taken after fasting for at least 8 hours.  Urinalysis, Routine w reflex microscopic -Urine, Clean Catch     Status: Abnormal   Collection Time: 06/20/23 11:13 AM  Result Value Ref Range   Color, Urine STRAW (A) YELLOW   APPearance CLEAR CLEAR   Specific Gravity, Urine 1.029 1.005 - 1.030   pH 5.0 5.0 - 8.0   Glucose, UA >=500 (A) NEGATIVE mg/dL   Hgb urine dipstick SMALL (A) NEGATIVE   Bilirubin Urine NEGATIVE NEGATIVE   Ketones, ur 80 (A) NEGATIVE mg/dL   Protein, ur  100 (A) NEGATIVE mg/dL   Nitrite NEGATIVE NEGATIVE   Leukocytes,Ua NEGATIVE NEGATIVE   RBC / HPF 0-5 0 - 5 RBC/hpf   WBC, UA 0-5 0 - 5 WBC/hpf   Bacteria, UA NONE SEEN NONE SEEN   Squamous Epithelial / HPF 0-5 0 - 5 /HPF   Mucus PRESENT     Comment: Performed at [facility], [address] [place], [place] [address]  CBC with Differential     Status: Abnormal   Collection Time: 06/20/23 11:22 AM  Result Value Ref Range   WBC 9.4 4.0 - 10.5 K/uL   RBC 5.41 (H) 3.87 - 5.11 MIL/uL   Hemoglobin 15.0 12.0 - 15.0 g/dL   HCT 44.0 (H) 10.2 - 72.5 %   MCV 87.4 80.0 - 100.0 fL   MCH 27.7 26.0 - 34.0 pg   MCHC 31.7 30.0 - 36.0 g/dL   RDW 36.6 44.0 - 34.7  %   Platelets 494 (H) 150 - 400 K/uL   nRBC 0.0 0.0 - 0.2 %   Neutrophils Relative % 81 %   Neutro Abs 7.7 1.7 - 7.7 K/uL   Lymphocytes Relative 11 %   Lymphs Abs 1.1 0.7 - 4.0 K/uL   Monocytes Relative 7 %   Monocytes Absolute 0.6 0.1 - 1.0 K/uL   Eosinophils Relative 0 %   Eosinophils Absolute 0.0 0.0 - 0.5 K/uL   Basophils Relative 0 %   Basophils Absolute 0.0 0.0 - 0.1 K/uL   Immature Granulocytes 1 %   Abs Immature Granulocytes 0.06 0.00 - 0.07 K/uL    Comment: Performed at [facility], [address] [place], [place] [address]  Comprehensive metabolic panel     Status: Abnormal   Collection Time: 06/20/23 11:22 AM  Result Value Ref Range   Sodium 130 (L) 135 - 145 mmol/L   Potassium 4.9 3.5 - 5.1 mmol/L   Chloride 97 (L) 98 - 111 mmol/L   CO2 13 (L) 22 - 32 mmol/L   Glucose, Bld 729 (HH) 70 - 99 mg/dL    Comment: CRITICAL RESULT CALLED TO, READ BACK BY AND VERIFIED WITH RN Glynn Octave AT 1221 06/20/23  CRUICKSHANK A Glucose reference range applies only to samples taken after fasting for at least 8 hours.    BUN 17 6 - 20 mg/dL   Creatinine, Ser 6.38 (H) 0.44 - 1.00 mg/dL   Calcium 9.9 8.9 - 75.6 mg/dL   Total Protein 9.5 (H) 6.5 - 8.1 g/dL   Albumin 4.6 3.5 - 5.0 g/dL   AST 14 (L) 15 - 41 U/L   ALT 24 0 - 44 U/L   Alkaline Phosphatase 130 (H) 38 - 126 U/L   Total Bilirubin 1.2 0.3 - 1.2 mg/dL   GFR, Estimated 47 (L) >60 mL/min    Comment: (NOTE) Calculated using the CKD-EPI Creatinine Equation (2021)    Anion gap 20 (H) 5 - 15    Comment: Performed at [facility], [address], [place], [place] [address]  hCG, serum, qualitative     Status: None   Collection Time: 06/20/23 11:22 AM  Result Value Ref Range   Preg, Serum NEGATIVE NEGATIVE    Comment:        THE SENSITIVITY OF THIS METHODOLOGY IS >10 mIU/mL. Performed at [facility], [address], [place], [place] [address]   Blood gas, venous      Status: Abnormal   Collection Time: 06/20/23 11:22  AM  Result Value Ref Range   pH, Ven 7.14 (LL) 7.25 - 7.43    Comment: CRITICAL RESULT CALLED TO, READ BACK BY AND VERIFIED WITH: RN L Zilla Shartzer AT 1135 06/20/23 CRUICKSHANK A    pCO2, Ven 41 (L) 44 - 60 mmHg   pO2, Ven 31 (LL) 32 - 45 mmHg    Comment: CRITICAL RESULT CALLED TO, READ BACK BY AND VERIFIED WITH: RN L Taylan Marez AT 1135 06/20/23 CRUICKSHANK A    Bicarbonate 14.0 (L) 20.0 - 28.0 mmol/L   Acid-base deficit 14.5 (H) 0.0 - 2.0 mmol/L   O2 Saturation 51.5 %   Patient temperature 37.0     Comment: Performed at [facility], [address] [place], [place] [address]  Beta-hydroxybutyric acid     Status: Abnormal   Collection Time: 06/20/23 11:22 AM  Result Value Ref Range   Beta-Hydroxybutyric Acid 5.77 (H) 0.05 - 0.27 mmol/L    Comment: RESULT CONFIRMED BY MANUAL DILUTION Performed at [facility], [address] [place], [place] [address]   CBG monitoring, ED     Status: Abnormal   Collection Time: 06/20/23  1:10 PM  Result Value Ref Range   Glucose-Capillary >600 (HH) 70 - 99 mg/dL    Comment: Glucose reference range applies only to samples taken after fasting for at least 8 hours.  CBG monitoring, ED     Status: Abnormal   Collection Time: 06/20/23  1:44 PM  Result Value Ref Range   Glucose-Capillary 583 (HH) 70 - 99 mg/dL    Comment: Glucose reference range applies only to samples taken after fasting for at least 8 hours.   Comment 1 Notify RN    No results found.  Pending Labs Unresulted Labs (From admission, onward)    None       Vitals/Pain Today's Vitals   06/20/23 1022 06/20/23 1024 06/20/23 1236  BP:  (!) 134/90 101/89  Pulse:  (!) 121 (!) 120  Resp:  18 19  Temp:  98.5 F (36.9 C) 98.2 F (36.8 C)  TempSrc:  Oral Oral  SpO2:  99% 97%  Weight: 95.7 kg    Height: 5\' 3"  (1.6 m)    PainSc: 0-No pain      Isolation Precautions No active  isolations  Medications Medications  insulin regular, human (MYXREDLIN) 100 units/ 100 mL infusion (17 Units/hr Intravenous Rate/Dose Change 06/20/23 1345)  lactated ringers infusion (has no administration in time range)  dextrose 5 % in lactated ringers infusion (has no administration in time range)  dextrose 50 % solution 0-50 mL (has no administration in time range)  potassium chloride 10 mEq in 100 mL IVPB (10 mEq Intravenous New Bag/Given 06/20/23 1327)  sodium chloride 0.9 % bolus 1,000 mL (0 mLs Intravenous Stopped 06/20/23 1221)  lactated ringers bolus 1,914 mL (1,914 mLs Intravenous New Bag/Given 06/20/23 1307)    Mobility walks

## 2023-06-20 NOTE — ED Provider Notes (Signed)
[place] EMERGENCY DEPARTMENT AT [facility] Provider Note   CSN: [id number] Arrival date & time: 06/20/23  1017     History  Chief Complaint  Patient presents with   Nausea   Emesis    Whitney Benton is a 52 y.o. female.  HPI   52 year old female with past medical history of diabetes and previous DKA presents emergency department with concern for dehydration, nausea/vomiting/diarrhea.  Patient states that this started a couple days ago.  She states that she is been compliant with her insulin however feels very dehydrated and continues with polyuria.  Patient states the only thing that she is been able to tolerate orally is sodas/sugary juice.  Everything else causes her to vomit or have diarrhea.  She denies any fever but endorses chills and fatigue.  No acute abdominal pain.  Home Medications Prior to Admission medications   Medication Sig Start Date End Date Taking? Authorizing Provider  Accu-Chek Softclix Lancets lancets Use to check blood sugar 3 times daily 06/09/23   Marcine Matar, MD  albuterol (VENTOLIN HFA) 108 (90 Base) MCG/ACT inhaler Inhale 2 puffs into the lungs every 6 (six) hours as needed for wheezing or shortness of breath (Cough). Patient taking differently: Inhale 2 puffs into the lungs every 6 (six) hours as needed for wheezing or shortness of breath (or coughing). 02/26/23   Marcine Matar, MD  amLODipine (NORVASC) 10 MG tablet Take 1 tablet (10 mg total) by mouth daily. 05/07/23   Anders Simmonds, PA-C  atorvastatin (LIPITOR) 40 MG tablet Take 1 tablet (40 mg total) by mouth daily. 05/07/23   Anders Simmonds, PA-C  Blood Glucose Monitoring Suppl (ACCU-CHEK GUIDE) w/Device KIT Use to check blood sugar 3 times daily. 06/09/23   Marcine Matar, MD  busPIRone (BUSPAR) 15 MG tablet Take 1 tablet (15 mg total) by mouth 3 (three) times daily. 06/17/23   Shanna Cisco, NP  Continuous Glucose Sensor (FREESTYLE LIBRE 3 SENSOR) MISC Use to check  blood sugar continuously throughout the day. Replace sensors once every 14 days. Dx E11.65 Patient not taking: Reported on 06/09/2023 05/07/23   Marcine Matar, MD  cycloSPORINE (RESTASIS) 0.05 % ophthalmic emulsion Apply 1 drop into both eyes twice a day 09/19/21     DULoxetine (CYMBALTA) 20 MG capsule Take 1 capsule (20 mg total) by mouth in the morning. 06/17/23   Shanna Cisco, NP  DULoxetine (CYMBALTA) 60 MG capsule Take 1 capsule (60 mg total) by mouth daily. 06/17/23   Shanna Cisco, NP  Erenumab-aooe (AIMOVIG) 140 MG/ML SOAJ Inject 140 mg into the skin every 28 (twenty-eight) days. 06/05/23   Drema Dallas, DO  gabapentin (NEURONTIN) 300 MG capsule Take 1 capsule (300 mg total) by mouth 3 (three) times daily. 06/17/23   Toy Cookey E, NP  glucose blood (ACCU-CHEK GUIDE) test strip Use to check blood sugar 3 times daily 06/09/23   Marcine Matar, MD  HYDROcodone-acetaminophen (NORCO/VICODIN) 5-325 MG tablet Take 1 tablet by mouth every 6 (six) hours as needed for moderate pain or severe pain.    [provider]  hydrOXYzine (ATARAX) 25 MG tablet TAKE 1 TABLET (25 MG TOTAL) BY MOUTH 3 (THREE) TIMES DAILY AS NEEDED. 06/17/23   Toy Cookey E, NP  insulin aspart (NOVOLOG FLEXPEN) 100 UNIT/ML FlexPen Inject 30 Units into the skin 3 (three) times daily with meals. 05/07/23   Anders Simmonds, PA-C  insulin glargine, 1 Unit Dial, (TOUJEO [place])  300 UNIT/ML Solostar Pen Inject 70 Units into the skin daily. 06/09/23   Marcine Matar, MD  Insulin Pen Needle (PEN NEEDLES 3/16") 31G X 5 MM MISC Use as directed with insulin pen 05/07/23   Georgian Co M, PA-C  metFORMIN (GLUCOPHAGE-XR) 500 MG 24 hr tablet Take 1 tablet (500 mg total) by mouth 2 (two) times daily. 05/07/23   Anders Simmonds, PA-C  methylPREDNISolone (MEDROL DOSEPAK) 4 MG TBPK tablet Take as directed for 6 days 05/08/23   Standiford, Jenelle Mages, DPM  metoprolol tartrate (LOPRESSOR) 50 MG tablet Take 1  tablet (50 mg total) by mouth 2 (two) times daily. 05/07/23   Anders Simmonds, PA-C  Multiple Vitamins-Minerals (EYE VITAMINS PO) Take 1 tablet by mouth daily.    [provider]  OLANZapine-Samidorphan (LYBALVI) 20-10 MG TABS Take 20 mg by mouth at bedtime. 06/17/23   Shanna Cisco, NP  omeprazole (PRILOSEC) 40 MG capsule TAKE 1 CAPSULE BY MOUTH ONCE DAILY 30 MINUTES BEFORE BREAKFAST Patient taking differently: Take 40 mg by mouth daily before breakfast. 03/10/23   Meredith Pel, NP  ondansetron (ZOFRAN-ODT) 4 MG disintegrating tablet Take 1 tablet (4 mg total) by mouth every 8 (eight) hours as needed for nausea or vomiting. 05/19/23   Smoot, Shawn Route, PA-C  polyethylene glycol (MIRALAX / GLYCOLAX) 17 g packet Take 17 g by mouth daily as needed for mild constipation or moderate constipation. 04/03/23   [provider]  Sodium Fluoride (DENTA 5000 PLUS DT) Place 1 application  onto teeth See admin instructions. Brush 1 application onto the teeth 2 times a day as directed in place of current toothpaste, then spit out. Do not eat, drink, or rinse after using.    [provider]  SUMAtriptan (IMITREX) 100 MG tablet TAKE 1 TABLET EARLIEST ONSET OF MIGRAINE. MAY REPEAT IN 2 HOURS IF HEADACHE PERSISTS OR RECURS. MAXIMUM 2 TABLETS IN 24 HOURS. Patient taking differently: Take 100 mg by mouth See admin instructions. Take 100 mg by mouth at the earliest onset of migraine- may repeat once in two hours if headache recurs or persists (max of 2 tablets/24 hrs) 03/11/23 03/10/24  Drema Dallas, DO  triamcinolone cream (KENALOG) 0.1 % Apply 1 Application topically 2 (two) times daily. Patient taking differently: Apply 1 Application topically 2 (two) times daily as needed (for irritation on the legs). 08/12/22   Marcine Matar, MD  zolpidem (AMBIEN) 10 MG tablet Take 1 tablet (10 mg total) by mouth at bedtime as needed for sleep. 06/17/23   Shanna Cisco, NP  cetirizine (ZYRTEC)  10 MG tablet Take 1 tablet (10 mg total) by mouth daily. Patient not taking: No sig reported 03/31/19 03/03/20  Hoy Register, MD      Allergies    Aspirin, Trulicity [dulaglutide], and Oxycodone    Review of Systems   Review of Systems  Constitutional:  Positive for appetite change, chills and fatigue. Negative for fever.  Respiratory:  Negative for shortness of breath.   Cardiovascular:  Negative for chest pain.  Gastrointestinal:  Positive for diarrhea, nausea and vomiting. Negative for abdominal pain.  Genitourinary:  Negative for flank pain.  Skin:  Negative for rash.  Neurological:  Negative for headaches.    Physical Exam Updated Vital Signs BP (!) 134/90   Pulse (!) 121   Temp 98.5 F (36.9 C) (Oral)   Resp 18   Ht 5\' 3"  (1.6 m)   Wt 95.7 kg   SpO2 99%  BMI 37.38 kg/m  Physical Exam Vitals and nursing note reviewed.  Constitutional:      General: She is not in acute distress.    Appearance: Normal appearance.  HENT:     Head: Normocephalic.     Mouth/Throat:     Mouth: Mucous membranes are moist.  Cardiovascular:     Rate and Rhythm: Tachycardia present.  Pulmonary:     Effort: Pulmonary effort is normal. No respiratory distress.  Abdominal:     Palpations: Abdomen is soft.     Tenderness: There is no abdominal tenderness.  Skin:    General: Skin is warm.  Neurological:     Mental Status: She is alert and oriented to person, place, and time. Mental status is at baseline.  Psychiatric:        Mood and Affect: Mood normal.     ED Results / Procedures / Treatments   Labs (all labs ordered are listed, but only abnormal results are displayed) Labs Reviewed  CBC WITH DIFFERENTIAL/PLATELET - Abnormal; Notable for the following components:      Result Value   RBC 5.41 (*)    HCT 47.3 (*)    Platelets 494 (*)    All other components within normal limits  BLOOD GAS, VENOUS - Abnormal; Notable for the following components:   pH, Ven 7.14 (*)    pCO2,  Ven 41 (*)    pO2, Ven 31 (*)    Bicarbonate 14.0 (*)    Acid-base deficit 14.5 (*)    All other components within normal limits  CBG MONITORING, ED - Abnormal; Notable for the following components:   Glucose-Capillary >600 (*)    All other components within normal limits  COMPREHENSIVE METABOLIC PANEL  HCG, SERUM, QUALITATIVE  URINALYSIS, ROUTINE W REFLEX MICROSCOPIC  BETA-HYDROXYBUTYRIC ACID    EKG None  Radiology No results found.  Procedures .Critical Care  Performed by: Rozelle Logan, DO Authorized by: Rozelle Logan, DO   Critical care provider statement:    Critical care time (minutes):  75   Critical care time was exclusive of:  Separately billable procedures and treating other patients   Critical care was necessary to treat or prevent imminent or life-threatening deterioration of the following conditions:  Endocrine crisis, metabolic crisis and dehydration   Critical care was time spent personally by me on the following activities:  Development of treatment plan with patient or surrogate, discussions with consultants, evaluation of patient's response to treatment, examination of patient, ordering and review of laboratory studies, ordering and review of radiographic studies, ordering and performing treatments and interventions, pulse oximetry, re-evaluation of patient's condition and review of old charts   I assumed direction of critical care for this patient from another provider in my specialty: no     Care discussed with: admitting provider       Medications Ordered in ED Medications  sodium chloride 0.9 % bolus 1,000 mL (1,000 mLs Intravenous New Bag/Given 06/20/23 1121)    ED Course/ Medical Decision Making/ A&P                             Medical Decision Making Amount and/or Complexity of Data Reviewed Labs: ordered.  Risk Prescription drug management. Decision regarding hospitalization.   52 year old female presents emergency department with  nausea/vomiting/diarrhea and concern for dehydration.  Patient is type I diabetic.  Initially stated that she was compliant with insulin but now says that she  does not feel her insulin pump was working and her last dose was 2 days ago.  She also admits to only being able to keep down soda/juice.  Patient is tachycardic on arrival, stable blood pressure.  Dry appearing but not in acute distress.  Mental status baseline.  Blood work confirms DKA with acidosis of 7.14.  Patient's been receiving IV hydration, DKA order set initiated with IV insulin.  Patient has tolerated well.  Patient will require admission for DKA, most likely from noncompliance.  Patients evaluation and results requires admission for further treatment and care.  Spoke with hospitalist, reviewed patient's ED course and they accept admission.  Patient agrees with admission plan, offers no new complaints and is stable/unchanged at time of admit.        Final Clinical Impression(s) / ED Diagnoses Final diagnoses:  None    Rx / DC Orders ED Discharge Orders     None         Rozelle Logan, DO 06/20/23 1334

## 2023-06-21 ENCOUNTER — Observation Stay (HOSPITAL_COMMUNITY): Payer: Medicaid Other

## 2023-06-21 DIAGNOSIS — I10 Essential (primary) hypertension: Secondary | ICD-10-CM | POA: Diagnosis present

## 2023-06-21 DIAGNOSIS — Z833 Family history of diabetes mellitus: Secondary | ICD-10-CM | POA: Diagnosis not present

## 2023-06-21 DIAGNOSIS — E876 Hypokalemia: Secondary | ICD-10-CM | POA: Diagnosis not present

## 2023-06-21 DIAGNOSIS — Z8619 Personal history of other infectious and parasitic diseases: Secondary | ICD-10-CM | POA: Diagnosis not present

## 2023-06-21 DIAGNOSIS — E1010 Type 1 diabetes mellitus with ketoacidosis without coma: Secondary | ICD-10-CM

## 2023-06-21 DIAGNOSIS — J45909 Unspecified asthma, uncomplicated: Secondary | ICD-10-CM | POA: Diagnosis present

## 2023-06-21 DIAGNOSIS — R739 Hyperglycemia, unspecified: Secondary | ICD-10-CM | POA: Diagnosis not present

## 2023-06-21 DIAGNOSIS — F32A Depression, unspecified: Secondary | ICD-10-CM | POA: Diagnosis present

## 2023-06-21 DIAGNOSIS — E871 Hypo-osmolality and hyponatremia: Secondary | ICD-10-CM | POA: Diagnosis present

## 2023-06-21 DIAGNOSIS — Z818 Family history of other mental and behavioral disorders: Secondary | ICD-10-CM | POA: Diagnosis not present

## 2023-06-21 DIAGNOSIS — E875 Hyperkalemia: Secondary | ICD-10-CM | POA: Diagnosis present

## 2023-06-21 DIAGNOSIS — E860 Dehydration: Secondary | ICD-10-CM | POA: Diagnosis present

## 2023-06-21 DIAGNOSIS — E785 Hyperlipidemia, unspecified: Secondary | ICD-10-CM | POA: Diagnosis present

## 2023-06-21 DIAGNOSIS — Z794 Long term (current) use of insulin: Secondary | ICD-10-CM | POA: Diagnosis not present

## 2023-06-21 DIAGNOSIS — R112 Nausea with vomiting, unspecified: Secondary | ICD-10-CM | POA: Diagnosis not present

## 2023-06-21 DIAGNOSIS — G43909 Migraine, unspecified, not intractable, without status migrainosus: Secondary | ICD-10-CM | POA: Diagnosis present

## 2023-06-21 DIAGNOSIS — Z8249 Family history of ischemic heart disease and other diseases of the circulatory system: Secondary | ICD-10-CM | POA: Diagnosis not present

## 2023-06-21 DIAGNOSIS — Z87891 Personal history of nicotine dependence: Secondary | ICD-10-CM | POA: Diagnosis not present

## 2023-06-21 DIAGNOSIS — Z7984 Long term (current) use of oral hypoglycemic drugs: Secondary | ICD-10-CM | POA: Diagnosis not present

## 2023-06-21 DIAGNOSIS — R7989 Other specified abnormal findings of blood chemistry: Secondary | ICD-10-CM | POA: Diagnosis present

## 2023-06-21 DIAGNOSIS — Z90710 Acquired absence of both cervix and uterus: Secondary | ICD-10-CM | POA: Diagnosis not present

## 2023-06-21 DIAGNOSIS — F411 Generalized anxiety disorder: Secondary | ICD-10-CM | POA: Diagnosis present

## 2023-06-21 DIAGNOSIS — R748 Abnormal levels of other serum enzymes: Secondary | ICD-10-CM | POA: Diagnosis present

## 2023-06-21 DIAGNOSIS — Z79899 Other long term (current) drug therapy: Secondary | ICD-10-CM | POA: Diagnosis not present

## 2023-06-21 DIAGNOSIS — E861 Hypovolemia: Secondary | ICD-10-CM | POA: Diagnosis present

## 2023-06-21 DIAGNOSIS — N179 Acute kidney failure, unspecified: Secondary | ICD-10-CM | POA: Diagnosis present

## 2023-06-21 LAB — CBC
HCT: 42.3 % (ref 36.0–46.0)
Hemoglobin: 13.7 g/dL (ref 12.0–15.0)
MCH: 27.7 pg (ref 26.0–34.0)
MCHC: 32.4 g/dL (ref 30.0–36.0)
MCV: 85.6 fL (ref 80.0–100.0)
Platelets: 450 10*3/uL — ABNORMAL HIGH (ref 150–400)
RBC: 4.94 MIL/uL (ref 3.87–5.11)
RDW: 14.6 % (ref 11.5–15.5)
WBC: 8.8 10*3/uL (ref 4.0–10.5)
nRBC: 0 % (ref 0.0–0.2)

## 2023-06-21 LAB — BASIC METABOLIC PANEL
Anion gap: 12 (ref 5–15)
Anion gap: 10 (ref 5–15)
BUN: 14 mg/dL (ref 6–20)
BUN: 17 mg/dL (ref 6–20)
CO2: 17 mmol/L — ABNORMAL LOW (ref 22–32)
CO2: 18 mmol/L — ABNORMAL LOW (ref 22–32)
Calcium: 9.8 mg/dL (ref 8.9–10.3)
Calcium: 9.2 mg/dL (ref 8.9–10.3)
Chloride: 107 mmol/L (ref 98–111)
Chloride: 105 mmol/L (ref 98–111)
Creatinine, Ser: 0.88 mg/dL (ref 0.44–1.00)
Creatinine, Ser: 1.09 mg/dL — ABNORMAL HIGH (ref 0.44–1.00)
GFR, Estimated: >60 mL/min (ref >60)
GFR, Estimated: >60 mL/min (ref >60)
Glucose, Bld: 190 mg/dL — ABNORMAL HIGH (ref 70–99)
Glucose, Bld: 353 mg/dL — ABNORMAL HIGH (ref 70–99)
Potassium: 3.9 mmol/L (ref 3.5–5.1)
Potassium: 3.7 mmol/L (ref 3.5–5.1)
Sodium: 136 mmol/L (ref 135–145)
Sodium: 133 mmol/L — ABNORMAL LOW (ref 135–145)

## 2023-06-21 LAB — COMPREHENSIVE METABOLIC PANEL
ALT: 19 U/L (ref 0–44)
AST: 13 U/L — ABNORMAL LOW (ref 15–41)
Albumin: 3.9 g/dL (ref 3.5–5.0)
Alkaline Phosphatase: 104 U/L (ref 38–126)
Anion gap: 9 (ref 5–15)
BUN: 13 mg/dL (ref 6–20)
CO2: 21 mmol/L — ABNORMAL LOW (ref 22–32)
Calcium: 9.7 mg/dL (ref 8.9–10.3)
Chloride: 107 mmol/L (ref 98–111)
Creatinine, Ser: 0.93 mg/dL (ref 0.44–1.00)
GFR, Estimated: >60 mL/min (ref >60)
Glucose, Bld: 150 mg/dL — ABNORMAL HIGH (ref 70–99)
Potassium: 3.4 mmol/L — ABNORMAL LOW (ref 3.5–5.1)
Sodium: 137 mmol/L (ref 135–145)
Total Bilirubin: 0.8 mg/dL (ref 0.3–1.2)
Total Protein: 7.9 g/dL (ref 6.5–8.1)

## 2023-06-21 LAB — GLUCOSE, CAPILLARY
Glucose-Capillary: 138 mg/dL — ABNORMAL HIGH (ref 70–99)
Glucose-Capillary: 162 mg/dL — ABNORMAL HIGH (ref 70–99)
Glucose-Capillary: 165 mg/dL — ABNORMAL HIGH (ref 70–99)
Glucose-Capillary: 175 mg/dL — ABNORMAL HIGH (ref 70–99)
Glucose-Capillary: 181 mg/dL — ABNORMAL HIGH (ref 70–99)
Glucose-Capillary: 192 mg/dL — ABNORMAL HIGH (ref 70–99)
Glucose-Capillary: 194 mg/dL — ABNORMAL HIGH (ref 70–99)
Glucose-Capillary: 201 mg/dL — ABNORMAL HIGH (ref 70–99)
Glucose-Capillary: 333 mg/dL — ABNORMAL HIGH (ref 70–99)
Glucose-Capillary: 387 mg/dL — ABNORMAL HIGH (ref 70–99)
Glucose-Capillary: 432 mg/dL — ABNORMAL HIGH (ref 70–99)

## 2023-06-21 LAB — HIV ANTIBODY (ROUTINE TESTING W REFLEX): HIV Screen 4th Generation wRfx: NONREACTIVE

## 2023-06-21 LAB — BETA-HYDROXYBUTYRIC ACID: Beta-Hydroxybutyric Acid: 0.27 mmol/L (ref 0.05–0.27)

## 2023-06-21 MED ORDER — INSULIN ASPART 100 UNIT/ML IJ SOLN
10.0000 [IU] | Freq: Three times a day (TID) | INTRAMUSCULAR | Status: DC
  Administered 2023-06-21: 10 [IU] via SUBCUTANEOUS

## 2023-06-21 MED ORDER — INSULIN GLARGINE-YFGN 100 UNIT/ML ~~LOC~~ SOLN
15.0000 [IU] | Freq: Every day | SUBCUTANEOUS | Status: DC
  Administered 2023-06-21: 15 [IU] via SUBCUTANEOUS
  Filled 2023-06-21 (×3): qty 0.15

## 2023-06-21 MED ORDER — SUMATRIPTAN SUCCINATE 50 MG PO TABS: 100.0000 mg | ORAL_TABLET | ORAL | Status: DC | PRN

## 2023-06-21 MED ORDER — INSULIN ASPART 100 UNIT/ML IJ SOLN
0.0000 [IU] | Freq: Every day | INTRAMUSCULAR | Status: DC
  Administered 2023-06-21: 4 [IU] via SUBCUTANEOUS

## 2023-06-21 MED ORDER — DULOXETINE HCL 60 MG PO CPEP
60.0000 mg | ORAL_CAPSULE | Freq: Every day | ORAL | Status: DC
  Administered 2023-06-21 – 2023-06-24 (×4): 60 mg via ORAL
  Filled 2023-06-21: qty 1
  Filled 2023-06-21: qty 2
  Filled 2023-06-21 (×2): qty 1

## 2023-06-21 MED ORDER — INSULIN ASPART 100 UNIT/ML IJ SOLN
6.0000 [IU] | Freq: Three times a day (TID) | INTRAMUSCULAR | Status: DC
  Administered 2023-06-21 (×2): 6 [IU] via SUBCUTANEOUS

## 2023-06-21 MED ORDER — POTASSIUM CHLORIDE CRYS ER 20 MEQ PO TBCR
40.0000 meq | EXTENDED_RELEASE_TABLET | Freq: Once | ORAL | Status: AC
  Administered 2023-06-21: 40 meq via ORAL
  Filled 2023-06-21: qty 2

## 2023-06-21 MED ORDER — INSULIN ASPART 100 UNIT/ML IJ SOLN
0.0000 [IU] | Freq: Three times a day (TID) | INTRAMUSCULAR | Status: DC
  Administered 2023-06-21: 20 [IU] via SUBCUTANEOUS
  Administered 2023-06-22: 3 [IU] via SUBCUTANEOUS
  Administered 2023-06-22: 20 [IU] via SUBCUTANEOUS
  Administered 2023-06-23: 7 [IU] via SUBCUTANEOUS
  Administered 2023-06-24: 4 [IU] via SUBCUTANEOUS

## 2023-06-21 MED ORDER — INSULIN GLARGINE-YFGN 100 UNIT/ML ~~LOC~~ SOLN
35.0000 [IU] | SUBCUTANEOUS | Status: DC
  Administered 2023-06-21 – 2023-06-22 (×2): 35 [IU] via SUBCUTANEOUS
  Filled 2023-06-21 (×2): qty 0.35

## 2023-06-21 MED ORDER — INSULIN ASPART 100 UNIT/ML IJ SOLN
0.0000 [IU] | Freq: Three times a day (TID) | INTRAMUSCULAR | Status: DC
  Administered 2023-06-21: 2 [IU] via SUBCUTANEOUS
  Administered 2023-06-21: 9 [IU] via SUBCUTANEOUS

## 2023-06-21 MED ORDER — INSULIN ASPART 100 UNIT/ML IJ SOLN: 0.0000 [IU] | Freq: Every day | INTRAMUSCULAR | Status: DC

## 2023-06-21 MED ORDER — INSULIN GLARGINE-YFGN 100 UNIT/ML ~~LOC~~ SOLN
10.0000 [IU] | Freq: Every day | SUBCUTANEOUS | Status: DC
  Filled 2023-06-21: qty 0.1

## 2023-06-21 MED ORDER — HYDROXYZINE HCL 25 MG PO TABS: 25.0000 mg | ORAL_TABLET | Freq: Three times a day (TID) | ORAL | Status: DC | PRN

## 2023-06-21 MED ORDER — SUMATRIPTAN SUCCINATE 50 MG PO TABS: 100.0000 mg | ORAL_TABLET | ORAL | Status: DC

## 2023-06-21 MED ORDER — CYCLOSPORINE 0.05 % OP EMUL
1.0000 [drp] | Freq: Two times a day (BID) | OPHTHALMIC | Status: DC
  Administered 2023-06-21 – 2023-06-24 (×7): 1 [drp] via OPHTHALMIC
  Filled 2023-06-21 (×7): qty 30

## 2023-06-21 MED ORDER — BUSPIRONE HCL 5 MG PO TABS: 15.0000 mg | ORAL_TABLET | Freq: Three times a day (TID) | ORAL | Status: DC

## 2023-06-21 MED ORDER — SODIUM CHLORIDE 0.9 % IV BOLUS
500.0000 mL | Freq: Once | INTRAVENOUS | Status: AC
  Administered 2023-06-21: 500 mL via INTRAVENOUS

## 2023-06-21 MED ORDER — SODIUM CHLORIDE 0.9 % IV BOLUS
1000.0000 mL | Freq: Once | INTRAVENOUS | Status: AC
  Administered 2023-06-21: 1000 mL via INTRAVENOUS

## 2023-06-21 NOTE — Inpatient Diabetes Management (Signed)
Inpatient Diabetes Program Recommendations  AACE/ADA: New Consensus Statement on Inpatient Glycemic Control (2015)  Target Ranges:  Prepandial:   less than 140 mg/dL      Peak postprandial:   less than 180 mg/dL (1-2 hours)      Critically ill patients:  140 - 180 mg/dL   Lab Results  Component Value Date   GLUCAP 387 (H) 06/21/2023   HGBA1C 13.0 (H) 04/30/2023    Review of Glycemic Control  Latest Reference Range & Units 06/21/23 06:24 06/21/23 07:35 06/21/23 12:24  Glucose-Capillary 70 - 99 mg/dL 161 (H) 096 (H) 045 (H)   Diabetes history: DM 2 Outpatient Diabetes medications:  Novolog 30 units tid with meals Lantus 25 units in the AM and Lantus 45 units in the PM Current orders for Inpatient glycemic control:  Semglee 35 units Novolog 0-9 units tid + hs Novolog 10 units tid meal coverage  Inpatient Diabetes Program Recommendations:    Note: glucose 380's at lunchtime after IV insulin discontinued.  -   Increase Semglee to 50 units, give additional 15 units today.  Thanks,  Christena Deem RN, MSN, BC-ADM Inpatient Diabetes Coordinator Team Pager [phone] (8a-5p)

## 2023-06-21 NOTE — Progress Notes (Addendum)
PROGRESS NOTE    Whitney Benton  NWG:[id number] DOB: 01/07/1971 DOA: 06/20/2023 PCP: Marcine Matar, MD   Brief Narrative: 52 year old with past medical history significant for seasonal allergies, anxiety, depression, osteoarthritis, asthma, GERD, glaucoma, hyperlipidemia, diabetes type 1 who presents to the ED complaining of hyperglycemia, polyuria, multiple episode of nausea and vomiting for the last 5 to 6 days prior to admission.  Patient was found to be in DKA with a blood gas pH 7.1, bicarb 14, acid deficit 14, beta hydroxybutyric acid 5.57.  CBG 729  Patient was admitted to the stepdown unit she was treated with insulin drip and subsequently has been transitioned to long-acting insulin.   Assessment & Plan:   Principal Problem:   DKA, type 1 (HCC) Active Problems:   Depression   Tobacco abuse   AKI (acute kidney injury) (HCC)   Abnormal LFTs   Generalized anxiety disorder   Hyperlipidemia   Essential hypertension   1-DKA diabetes type 1: -Patient presented with hyperglycemia CBG 700, bicarb 14, beta be uric acid 5.7. -She was treated with IV fluids and insulin drip. -Anion gap down to 9, bicarb 21, CBG 150.  She has been transitioned to same clean this morning 35 units daily. -Add 10 units of NovoLog for meal coverage.  Continue with a sliding scale insulin. -Workup for infection: UA 0-5 white blood cell not significant for infection, will proceed with chest x-ray -in setting of no compliance with diet.  Transfer to med surgery.   2-AKI: Presents with a creatinine of 1.3.  Prior creatinine ratio 0.7 In the setting of hypovolemia in the setting of DKA. Improved with IV fluids.  Hyponatremia: Pseudohyponatremia in the setting of hyperglycemia.  Hyperkalemia: In the setting of DKA.  This has resolved.   Depression: Continue with duloxetine  Generalized anxiety disorder: Continue gabapentin, and atarax PRN  Tobacco use: Counseling.   Transaminases: Elevation  of alkaline phosphatase in the setting of dehydration.  This has normalized   Hyperlipidemia: Continue with Lipitor.   Essential hypertension: Continue with metoprolol  Hypokalemia; replete orally.   Estimated body mass index is 37.61 kg/m as calculated from the following:   Height as of this encounter: 5\' 3"  (1.6 m).   Weight as of this encounter: 96.3 kg.   DVT prophylaxis: Lovenox Code Status: Full code Family Communication: Care discussed with patient.  Disposition Plan:  Status is: Observation The patient will require care spanning > 2 midnights and should be moved to inpatient because: DKA    Consultants:  none  Procedures:  None  Antimicrobials:    Subjective: She denies pain, denies cough.  She has been drinking soda, and sweet drinks.   Objective: Vitals:   06/21/23 0300 06/21/23 0400 06/21/23 0500 06/21/23 0600  BP: 111/63 (!) 138/100 131/83 127/89  Pulse: 82 82 85 86  Resp: 14 14 16 15   Temp:  98.4 F (36.9 C)    TempSrc:  Oral    SpO2: 95% 97% 96% 95%  Weight:      Height:        Intake/Output Summary (Last 24 hours) at 06/21/2023 0701 Last data filed at 06/21/2023 0645 Gross per 24 hour  Intake 3152.77 ml  Output --  Net 3152.77 ml   Filed Weights   06/20/23 1022 06/20/23 1500  Weight: 95.7 kg 96.3 kg    Examination:  General exam: Appears calm and comfortable  Respiratory system: Clear to auscultation. Respiratory effort normal. Cardiovascular system: S1 & S2 heard, RRR.  No JVD, murmurs, rubs, gallops or clicks. No pedal edema. Gastrointestinal system: Abdomen is nondistended, soft and nontender. No organomegaly or masses felt. Normal bowel sounds heard. Central nervous system: Alert and oriented. No focal neurological deficits. Extremities: Symmetric 5 x 5 power.    Data Reviewed: I have personally reviewed following labs and imaging studies  CBC: Recent Labs  Lab 06/20/23 1122 06/21/23 0308  WBC 9.4 8.8  NEUTROABS 7.7   --   HGB 15.0 13.7  HCT 47.3* 42.3  MCV 87.4 85.6  PLT 494* 450*   Basic Metabolic Panel: Recent Labs  Lab 06/20/23 1122 06/20/23 1534 06/20/23 1842 06/20/23 2317 06/21/23 0308  NA 130* 136 136 136 137  K 4.9 5.2* 4.4 3.9 3.4*  CL 97* 105 107 107 107  CO2 13* 13* 14* 17* 21*  GLUCOSE 729* 379* 168* 190* 150*  BUN 17 16 15 14 13   CREATININE 1.36* 1.17* 1.03* 0.88 0.93  CALCIUM 9.9 10.3 10.2 9.8 9.7   GFR: Estimated Creatinine Clearance: 78.2 mL/min (by C-G formula based on SCr of 0.93 mg/dL). Liver Function Tests: Recent Labs  Lab 06/20/23 1122 06/21/23 0308  AST 14* 13*  ALT 24 19  ALKPHOS 130* 104  BILITOT 1.2 0.8  PROT 9.5* 7.9  ALBUMIN 4.6 3.9   No results for input(s): "LIPASE", "AMYLASE" in the last 168 hours. No results for input(s): "AMMONIA" in the last 168 hours. Coagulation Profile: No results for input(s): "INR", "PROTIME" in the last 168 hours. Cardiac Enzymes: No results for input(s): "CKTOTAL", "CKMB", "CKMBINDEX", "TROPONINI" in the last 168 hours. BNP (last 3 results) No results for input(s): "PROBNP" in the last 8760 hours. HbA1C: No results for input(s): "HGBA1C" in the last 72 hours. CBG: Recent Labs  Lab 06/21/23 0203 06/21/23 0302 06/21/23 0403 06/21/23 0504 06/21/23 0624  GLUCAP 201* 165* 138* 175* 192*   Lipid Profile: No results for input(s): "CHOL", "HDL", "LDLCALC", "TRIG", "CHOLHDL", "LDLDIRECT" in the last 72 hours. Thyroid Function Tests: No results for input(s): "TSH", "T4TOTAL", "FREET4", "T3FREE", "THYROIDAB" in the last 72 hours. Anemia Panel: No results for input(s): "VITAMINB12", "FOLATE", "FERRITIN", "TIBC", "IRON", "RETICCTPCT" in the last 72 hours. Sepsis Labs: No results for input(s): "PROCALCITON", "LATICACIDVEN" in the last 168 hours.  No results found for this or any previous visit (from the past 240 hour(s)).       Radiology Studies: No results found.      Scheduled Meds:  amLODipine  10 mg Oral  Daily   atorvastatin  40 mg Oral Daily   Chlorhexidine Gluconate Cloth  6 each Topical Daily   enoxaparin (LOVENOX) injection  40 mg Subcutaneous Q24H   gabapentin  300 mg Oral TID   insulin aspart  0-5 Units Subcutaneous QHS   insulin aspart  0-9 Units Subcutaneous TID WC   insulin aspart  6 Units Subcutaneous TID WC   insulin glargine-yfgn  35 Units Subcutaneous Q24H   metoprolol tartrate  50 mg Oral BID   pantoprazole  40 mg Oral Daily   Continuous Infusions:  dextrose 5% lactated ringers 125 mL/hr at 06/21/23 0645   insulin 6.5 Units/hr (06/21/23 0645)   lactated ringers Stopped (06/20/23 1805)     LOS: 0 days    Time spent: 35 minutes.     Alba Cory, MD [facility]   If 7PM-7AM, please contact night-coverage [email or web address]  06/21/2023, 7:01 AM

## 2023-06-22 DIAGNOSIS — E1010 Type 1 diabetes mellitus with ketoacidosis without coma: Secondary | ICD-10-CM | POA: Diagnosis not present

## 2023-06-22 LAB — BASIC METABOLIC PANEL
Anion gap: 7 (ref 5–15)
BUN: 15 mg/dL (ref 6–20)
CO2: 20 mmol/L — ABNORMAL LOW (ref 22–32)
Calcium: 9 mg/dL (ref 8.9–10.3)
Chloride: 109 mmol/L (ref 98–111)
Creatinine, Ser: 0.99 mg/dL (ref 0.44–1.00)
GFR, Estimated: >60 mL/min (ref >60)
Glucose, Bld: 268 mg/dL — ABNORMAL HIGH (ref 70–99)
Potassium: 3.6 mmol/L (ref 3.5–5.1)
Sodium: 136 mmol/L (ref 135–145)

## 2023-06-22 LAB — GLUCOSE, CAPILLARY
Glucose-Capillary: 309 mg/dL — ABNORMAL HIGH (ref 70–99)
Glucose-Capillary: 400 mg/dL — ABNORMAL HIGH (ref 70–99)
Glucose-Capillary: 149 mg/dL — ABNORMAL HIGH (ref 70–99)
Glucose-Capillary: 87 mg/dL (ref 70–99)
Glucose-Capillary: 175 mg/dL — ABNORMAL HIGH (ref 70–99)

## 2023-06-22 MED ORDER — INSULIN GLARGINE-YFGN 100 UNIT/ML ~~LOC~~ SOLN
25.0000 [IU] | Freq: Every day | SUBCUTANEOUS | Status: DC
  Filled 2023-06-22: qty 0.25

## 2023-06-22 MED ORDER — INSULIN GLARGINE-YFGN 100 UNIT/ML ~~LOC~~ SOLN
15.0000 [IU] | Freq: Every day | SUBCUTANEOUS | Status: DC
  Administered 2023-06-22: 15 [IU] via SUBCUTANEOUS
  Filled 2023-06-22 (×2): qty 0.15

## 2023-06-22 MED ORDER — SODIUM CHLORIDE 0.9 % IV SOLN: INTRAVENOUS | Status: DC

## 2023-06-22 MED ORDER — INSULIN ASPART 100 UNIT/ML IJ SOLN
16.0000 [IU] | Freq: Three times a day (TID) | INTRAMUSCULAR | Status: DC
  Administered 2023-06-22 – 2023-06-24 (×5): 16 [IU] via SUBCUTANEOUS

## 2023-06-22 MED ORDER — INSULIN ASPART 100 UNIT/ML IJ SOLN: 25.0000 [IU] | Freq: Three times a day (TID) | INTRAMUSCULAR | Status: DC

## 2023-06-22 MED ORDER — SODIUM CHLORIDE 0.9 % IV BOLUS
500.0000 mL | Freq: Once | INTRAVENOUS | Status: AC
  Administered 2023-06-22: 500 mL via INTRAVENOUS

## 2023-06-22 MED ORDER — INSULIN ASPART 100 UNIT/ML IJ SOLN
20.0000 [IU] | Freq: Three times a day (TID) | INTRAMUSCULAR | Status: DC
  Administered 2023-06-22: 20 [IU] via SUBCUTANEOUS

## 2023-06-22 MED ORDER — INSULIN GLARGINE-YFGN 100 UNIT/ML ~~LOC~~ SOLN
10.0000 [IU] | Freq: Once | SUBCUTANEOUS | Status: AC
  Administered 2023-06-22: 10 [IU] via SUBCUTANEOUS
  Filled 2023-06-22: qty 0.1

## 2023-06-22 MED ORDER — INSULIN ASPART 100 UNIT/ML IJ SOLN
18.0000 [IU] | Freq: Three times a day (TID) | INTRAMUSCULAR | Status: DC
  Administered 2023-06-22: 18 [IU] via SUBCUTANEOUS

## 2023-06-22 MED ORDER — INSULIN GLARGINE-YFGN 100 UNIT/ML ~~LOC~~ SOLN
45.0000 [IU] | SUBCUTANEOUS | Status: DC
  Administered 2023-06-23 – 2023-06-24 (×2): 45 [IU] via SUBCUTANEOUS
  Filled 2023-06-22 (×2): qty 0.45

## 2023-06-22 NOTE — Plan of Care (Signed)
  Problem: Metabolic: Goal: Ability to maintain appropriate glucose levels will improve Outcome: Progressing   Problem: Skin Integrity: Goal: Risk for impaired skin integrity will decrease Outcome: Progressing   Problem: Tissue Perfusion: Goal: Adequacy of tissue perfusion will improve Outcome: Progressing   

## 2023-06-22 NOTE — Progress Notes (Signed)
PROGRESS NOTE    Whitney Benton  ZOX:[id number] DOB: 1971-03-17 DOA: 06/20/2023 PCP: Marcine Matar, MD   Brief Narrative: 52 year old with past medical history significant for seasonal allergies, anxiety, depression, osteoarthritis, asthma, GERD, glaucoma, hyperlipidemia, diabetes type 1 who presents to the ED complaining of hyperglycemia, polyuria, multiple episode of nausea and vomiting for the last 5 to 6 days prior to admission.  Patient was found to be in DKA with a blood gas pH 7.1, bicarb 14, acid deficit 14, beta hydroxybutyric acid 5.57.  CBG 729  Patient was admitted to the stepdown unit she was treated with insulin drip and subsequently has been transitioned to long-acting insulin.   Assessment & Plan:   Principal Problem:   DKA, type 1 (HCC) Active Problems:   Depression   Tobacco abuse   AKI (acute kidney injury) (HCC)   Abnormal LFTs   Generalized anxiety disorder   Hyperlipidemia   Essential hypertension   1-DKA diabetes type 1:Hyperglycemia, Uncontrolled.  -Patient presented with hyperglycemia CBG 700, bicarb 14, beta be uric acid 5.7. -She was treated with IV fluids and insulin drip. -Anion gap down to 9, bicarb 21, CBG 150.  She has been transitioned to same clean this morning 35 units daily. - Continue with a sliding scale insulin. -Workup for infection: UA 0-5 white blood cell not significant for infection, chest x ray: negative.  -in setting of no compliance with diet.  -CBG elevated yesterday 300. Plan to increase Semglee to 45 units am and 25 units at HS. Increase meal coverage to 20 units. She uses 30 units Novolog at home with meals.    2-AKI: Presents with a creatinine of 1.3.  Prior creatinine ratio 0.7 In the setting of hypovolemia in the setting of DKA. Improved with IV fluids.  Hyponatremia: Pseudohyponatremia in the setting of hyperglycemia.  Hyperkalemia: In the setting of DKA.  This has resolved.   Depression: Continue with  duloxetine  Generalized anxiety disorder: Continue gabapentin, and atarax PRN  Tobacco use: Counseling.   Transaminases: Elevation of alkaline phosphatase in the setting of dehydration.  This has normalized   Hyperlipidemia: Continue with Lipitor.   Essential hypertension: Continue with metoprolol  Hypokalemia; Replaced.   Estimated body mass index is 37.61 kg/m as calculated from the following:   Height as of this encounter: 5\' 3"  (1.6 m).   Weight as of this encounter: 96.3 kg.   DVT prophylaxis: Lovenox Code Status: Full code Family Communication: Care discussed with patient.  Disposition Plan:  Status is: Observation The patient will require care spanning > 2 midnights and should be moved to inpatient because: DKA, Hopefully home tomorrow if CBG controlled.     Consultants:  none  Procedures:  None  Antimicrobials:    Subjective: She relates she uses 30 units of novolog at home with meals. She also takes45 units lantus ant HS and 25 units in am.   Objective: Vitals:   06/21/23 1552 06/21/23 2105 06/22/23 0422 06/22/23 1321  BP: 124/80 114/76 117/74 115/71  Pulse: 79 85 69 76  Resp: 16 18 18 16   Temp: 98.3 F (36.8 C) 98.2 F (36.8 C) 98.3 F (36.8 C) 98.6 F (37 C)  TempSrc:  Oral Oral Oral  SpO2: 98% 97% 98% 94%  Weight:      Height:        Intake/Output Summary (Last 24 hours) at 06/22/2023 1357 Last data filed at 06/22/2023 1219 Gross per 24 hour  Intake 1659.87 ml  Output --  Net 1659.87 ml    Filed Weights   06/20/23 1022 06/20/23 1500  Weight: 95.7 kg 96.3 kg    Examination:  General exam: NAD Respiratory system: CTA Cardiovascular system: S 1, S 2 RRR Gastrointestinal system: BS present, soft, nt Central nervous system: Non focal.  Extremities: Symmetric 5 x 5 power.    Data Reviewed: I have personally reviewed following labs and imaging studies  CBC: Recent Labs  Lab 06/20/23 1122 06/21/23 0308  WBC 9.4 8.8   NEUTROABS 7.7  --   HGB 15.0 13.7  HCT 47.3* 42.3  MCV 87.4 85.6  PLT 494* 450*    Basic Metabolic Panel: Recent Labs  Lab 06/20/23 1842 06/20/23 2317 06/21/23 0308 06/21/23 1920 06/22/23 0330  NA 136 136 137 133* 136  K 4.4 3.9 3.4* 3.7 3.6  CL 107 107 107 105 109  CO2 14* 17* 21* 18* 20*  GLUCOSE 168* 190* 150* 353* 268*  BUN 15 14 13 17 15   CREATININE 1.03* 0.88 0.93 1.09* 0.99  CALCIUM 10.2 9.8 9.7 9.2 9.0    GFR: Estimated Creatinine Clearance: 73.5 mL/min (by C-G formula based on SCr of 0.99 mg/dL). Liver Function Tests: Recent Labs  Lab 06/20/23 1122 06/21/23 0308  AST 14* 13*  ALT 24 19  ALKPHOS 130* 104  BILITOT 1.2 0.8  PROT 9.5* 7.9  ALBUMIN 4.6 3.9    No results for input(s): "LIPASE", "AMYLASE" in the last 168 hours. No results for input(s): "AMMONIA" in the last 168 hours. Coagulation Profile: No results for input(s): "INR", "PROTIME" in the last 168 hours. Cardiac Enzymes: No results for input(s): "CKTOTAL", "CKMB", "CKMBINDEX", "TROPONINI" in the last 168 hours. BNP (last 3 results) No results for input(s): "PROBNP" in the last 8760 hours. HbA1C: No results for input(s): "HGBA1C" in the last 72 hours. CBG: Recent Labs  Lab 06/21/23 1224 06/21/23 1645 06/21/23 2103 06/22/23 0502 06/22/23 1139  GLUCAP 387* 432* 333* 309* 149*    Lipid Profile: No results for input(s): "CHOL", "HDL", "LDLCALC", "TRIG", "CHOLHDL", "LDLDIRECT" in the last 72 hours. Thyroid Function Tests: No results for input(s): "TSH", "T4TOTAL", "FREET4", "T3FREE", "THYROIDAB" in the last 72 hours. Anemia Panel: No results for input(s): "VITAMINB12", "FOLATE", "FERRITIN", "TIBC", "IRON", "RETICCTPCT" in the last 72 hours. Sepsis Labs: No results for input(s): "PROCALCITON", "LATICACIDVEN" in the last 168 hours.  No results found for this or any previous visit (from the past 240 hour(s)).       Radiology Studies: DG Chest 2 View  Result Date:  06/21/2023 CLINICAL DATA:  Nausea and vomiting 1 week with hyperglycemia. EXAM: CHEST - 2 VIEW COMPARISON:  10/06/2022 FINDINGS: Lungs are adequately inflated and otherwise clear. Cardiomediastinal silhouette is normal. Degenerative changes of the spine. IMPRESSION: No active cardiopulmonary disease. Electronically Signed   By: Elberta Fortis M.D.   On: 06/21/2023 14:31        Scheduled Meds:  amLODipine  10 mg Oral Daily   atorvastatin  40 mg Oral Daily   cycloSPORINE  1 drop Both Eyes BID   DULoxetine  60 mg Oral Daily   enoxaparin (LOVENOX) injection  40 mg Subcutaneous Q24H   gabapentin  300 mg Oral TID   insulin aspart  0-20 Units Subcutaneous TID WC   insulin aspart  0-5 Units Subcutaneous QHS   insulin aspart  20 Units Subcutaneous TID WC   insulin glargine-yfgn  25 Units Subcutaneous QHS   [START ON 06/23/2023] insulin glargine-yfgn  45 Units Subcutaneous Q24H   metoprolol  tartrate  50 mg Oral BID   pantoprazole  40 mg Oral Daily   Continuous Infusions:  sodium chloride       LOS: 1 day    Time spent: 35 minutes.     Alba Cory, MD [facility]   If 7PM-7AM, please contact night-coverage [email or web address]  06/22/2023, 1:57 PM

## 2023-06-23 LAB — GLUCOSE, CAPILLARY
Glucose-Capillary: 304 mg/dL — ABNORMAL HIGH (ref 70–99)
Glucose-Capillary: 243 mg/dL — ABNORMAL HIGH (ref 70–99)
Glucose-Capillary: 98 mg/dL (ref 70–99)
Glucose-Capillary: 67 mg/dL — ABNORMAL LOW (ref 70–99)
Glucose-Capillary: 107 mg/dL — ABNORMAL HIGH (ref 70–99)
Glucose-Capillary: 190 mg/dL — ABNORMAL HIGH (ref 70–99)

## 2023-06-23 MED ORDER — INSULIN GLARGINE-YFGN 100 UNIT/ML ~~LOC~~ SOLN
20.0000 [IU] | Freq: Every day | SUBCUTANEOUS | Status: DC
  Administered 2023-06-23: 20 [IU] via SUBCUTANEOUS
  Filled 2023-06-23 (×2): qty 0.2

## 2023-06-23 NOTE — Progress Notes (Signed)
Hypoglycemic Event  CBG: 67  Treatment: 8 oz juice/soda  Symptoms: None  Follow-up CBG: Time:1656 CBG Result:107  Possible Reasons for Event: Unknown  Comments/MD notified:none    Whitney Benton

## 2023-06-23 NOTE — TOC Initial Note (Signed)
Transition of Care [facility]) - Initial/Assessment Note    Patient Details  Name: Whitney Benton MRN: [id number] Date of Birth: [birth year]  Transition of Care [facility]) CM/SW Contact:    Coralyn Helling, LCSW Phone Number: 06/23/2023, 10:02 AM  Clinical Narrative:  TOC met with patient \\at  bedside. Patient has had multiple DKA admissions and SDOH indicator for food insecurity.Patient reports that her sister and nieces/nephews live with her. Patient reports that she is unemployed due to medical issues that keep her in and out of the hospital. Patient reports she gets $200 a month in SNAP benefits and has no additional income. She reports difficulty maintaining BGL due to food insecurities. Patient has transportation and reports going to [employer] when she is able.               Food resources added to patient AVS.       Expected Discharge Plan: Home/Self Care Barriers to Discharge: Continued Medical Work up   Patient Goals and CMS Choice Patient states their goals for this hospitalization and ongoing recovery are:: Get better   Choice offered to / list presented to : NA      Expected Discharge Plan and Services In-house Referral: NA Discharge Planning Services: NA Post Acute Care Choice: NA Living arrangements for the past 2 months: Single Family Home                 DME Arranged: N/A DME Agency: NA       HH Arranged: NA HH Agency: NA        Prior Living Arrangements/Services Living arrangements for the past 2 months: Single Family Home Lives with:: Relatives Patient language and need for interpreter reviewed:: Yes Do you feel safe going back to the place where you live?: Yes      Need for Family Participation in Patient Care: Yes (Comment) Care giver support system in place?: Yes (comment)   Criminal Activity/Legal Involvement Pertinent to Current Situation/Hospitalization: No - Comment as needed  Activities of Daily Living Home Assistive Devices/Equipment: None ADL  Screening (condition at time of admission) Patient's cognitive ability adequate to safely complete daily activities?: Yes Is the patient deaf or have difficulty hearing?: No Does the patient have difficulty seeing, even when wearing glasses/contacts?: Yes Does the patient have difficulty concentrating, remembering, or making decisions?: No Patient able to express need for assistance with ADLs?: Yes Does the patient have difficulty dressing or bathing?: No Independently performs ADLs?: Yes (appropriate for developmental age) Does the patient have difficulty walking or climbing stairs?: No Weakness of Legs: None Weakness of Arms/Hands: None  Permission Sought/Granted   Permission granted to share information with : No              Emotional Assessment Appearance:: Appears stated age Attitude/Demeanor/Rapport: Engaged Affect (typically observed): Accepting Orientation: : Oriented to Self, Oriented to Place, Oriented to  Time, Oriented to Situation Alcohol / Substance Use: Not Applicable Psych Involvement: No (comment)  Admission diagnosis:  DKA, 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] Diabetic ketoacidosis without coma associated with type 1 diabetes mellitus (HCC) [E10.10] Patient Active Problem List   Diagnosis Date Noted   DKA, type 1 (HCC) 06/20/2023   Essential hypertension 06/20/2023   Vitamin D deficiency 06/20/2023   Hyperlipidemia 03/06/2023   Severe recurrent major depressive disorder with psychotic features (HCC) 12/27/2022   Primary insomnia 12/27/2022   Gallstone pancreatitis 12/03/2022   Chronic cholecystitis with calculus 12/03/2022   Morbid obesity (HCC) 04/04/2022   Former smoker  04/30/2021   Major depressive disorder, recurrent episode, moderate (HCC) 04/30/2021   Substance induced mood disorder (HCC) 03/22/2021   Generalized anxiety disorder 03/22/2021   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   DKA (diabetic ketoacidosis) (HCC) 01/23/2021   Glaucoma suspect  04/12/2020   DKA, type 2, not at goal [facility]) 10/02/2019   AKI (acute kidney injury) (HCC) 10/02/2019   Hyperkalemia 10/02/2019   Leukocytosis 10/02/2019   Abnormal LFTs 10/02/2019   Stressful life events affecting family and household 08/06/2019   New onset type 2 diabetes mellitus (HCC) 02/04/2019   Morbid obesity with BMI of 40.0-44.9, adult (HCC) 02/04/2019   Tobacco dependence 02/04/2019   Left arm weakness 12/06/2014   Paresthesias/numbness 12/06/2014   Tobacco abuse 11/14/2014   Depression    Mixed incontinence 06/27/2014   History of TVH in 2006 for fibroids and menorrhagia; benign pathology 09/08/2011   PCP:  Marcine Matar, MD Pharmacy:   [facility] [address] [place] ([place]), [place] - [address] [place]) [place] [address] Phone: [phone] Fax: [phone]     Social Determinants of Health (SDOH) Social History: SDOH Screenings   Food Insecurity: Food Insecurity Present (06/20/2023)  Housing: Low Risk  (06/20/2023)  Transportation Needs: No Transportation Needs (06/20/2023)  Utilities: Not At Risk (06/20/2023)  Depression (PHQ2-9): High Risk (06/17/2023)  Tobacco Use: Medium Risk (06/20/2023)   SDOH Interventions:     Readmission Risk Interventions    06/23/2023    9:59 AM  Readmission Risk Prevention Plan  Transportation Screening Complete  HRI or Home Care Consult Complete  Social Work Consult for Recovery Care Planning/Counseling Complete  Palliative Care Screening Not Applicable

## 2023-06-23 NOTE — Progress Notes (Signed)
PROGRESS NOTE    Whitney Benton  WUJ:[id number] DOB: Mar 11, 1971 DOA: 06/20/2023 PCP: Marcine Matar, MD   Brief Narrative: 52 year old with past medical history significant for seasonal allergies, anxiety, depression, osteoarthritis, asthma, GERD, glaucoma, hyperlipidemia, diabetes type 1 who presents to the ED complaining of hyperglycemia, polyuria, multiple episode of nausea and vomiting for the last 5 to 6 days prior to admission.  Patient was found to be in DKA with a blood gas pH 7.1, bicarb 14, acid deficit 14, beta hydroxybutyric acid 5.57.  CBG 729  Patient was admitted to the stepdown unit she was treated with insulin drip and subsequently has been transitioned to long-acting insulin.   Assessment & Plan:   Principal Problem:   DKA, type 1 (HCC) Active Problems:   Depression   Tobacco abuse   AKI (acute kidney injury) (HCC)   Abnormal LFTs   Generalized anxiety disorder   Hyperlipidemia   Essential hypertension   1-DKA diabetes type 1: Hyperglycemia, Uncontrolled.  -Patient presented with hyperglycemia CBG 700, bicarb 14, beta be uric acid 5.7. -She was treated with IV fluids and insulin drip. -Workup for infection: UA 0-5 white blood cell not significant for infection, chest x ray: negative.  -in setting of no compliance with diet.  -Continue with  Semglee to 45 units am and 20 units at HS. -Meal coverage to 16 units. She uses 30 units Novolog at home with meals.    2-AKI: Presents with a creatinine of 1.3.  Prior creatinine ratio 0.7 In the setting of hypovolemia in the setting of DKA. Improved with IV fluids.  Hyponatremia: Pseudohyponatremia in the setting of hyperglycemia.  Hyperkalemia: In the setting of DKA.  This has resolved.   Depression: Continue with duloxetine  Generalized anxiety disorder: Continue gabapentin, and atarax PRN  Tobacco use: Counseling.   Transaminases: Elevation of alkaline phosphatase in the setting of dehydration.  This  has normalized   Hyperlipidemia: Continue with Lipitor.   Essential hypertension: Continue with metoprolol  Hypokalemia; Replaced.   Estimated body mass index is 37.61 kg/m as calculated from the following:   Height as of this encounter: 5\' 3"  (1.6 m).   Weight as of this encounter: 96.3 kg.   DVT prophylaxis: Lovenox Code Status: Full code Family Communication: Care discussed with patient.  Disposition Plan:  Status is: Observation The patient will require care spanning > 2 midnights and should be moved to inpatient because: DKA, home tomorrow.     Consultants:  none  Procedures:  None  Antimicrobials:    Subjective: She is feeling well. Her CBG was fluctuating so much yesterday  She wants to make sure she doesn't have to back to hospital/   Objective: Vitals:   06/22/23 0422 06/22/23 1321 06/22/23 2055 06/23/23 0507  BP: 117/74 115/71 124/78 100/63  Pulse: 69 76 86 79  Resp: 18 16 18 18   Temp: 98.3 F (36.8 C) 98.6 F (37 C) 98.4 F (36.9 C) 98.1 F (36.7 C)  TempSrc: Oral Oral Oral Oral  SpO2: 98% 94% 97% 95%  Weight:      Height:        Intake/Output Summary (Last 24 hours) at 06/23/2023 1312 Last data filed at 06/23/2023 1302 Gross per 24 hour  Intake 3718.34 ml  Output --  Net 3718.34 ml    Filed Weights   06/20/23 1022 06/20/23 1500  Weight: 95.7 kg 96.3 kg    Examination:  General exam: NAD Respiratory system: CTA Cardiovascular system: S 1, S  2 RRR Gastrointestinal system: BS Present, soft, nt Central nervous system: non focal.  Extremities: symmetric power.     Data Reviewed: I have personally reviewed following labs and imaging studies  CBC: Recent Labs  Lab 06/20/23 1122 06/21/23 0308  WBC 9.4 8.8  NEUTROABS 7.7  --   HGB 15.0 13.7  HCT 47.3* 42.3  MCV 87.4 85.6  PLT 494* 450*    Basic Metabolic Panel: Recent Labs  Lab 06/20/23 1842 06/20/23 2317 06/21/23 0308 06/21/23 1920 06/22/23 0330  NA 136 136 137  133* 136  K 4.4 3.9 3.4* 3.7 3.6  CL 107 107 107 105 109  CO2 14* 17* 21* 18* 20*  GLUCOSE 168* 190* 150* 353* 268*  BUN 15 14 13 17 15   CREATININE 1.03* 0.88 0.93 1.09* 0.99  CALCIUM 10.2 9.8 9.7 9.2 9.0    GFR: Estimated Creatinine Clearance: 73.5 mL/min (by C-G formula based on SCr of 0.99 mg/dL). Liver Function Tests: Recent Labs  Lab 06/20/23 1122 06/21/23 0308  AST 14* 13*  ALT 24 19  ALKPHOS 130* 104  BILITOT 1.2 0.8  PROT 9.5* 7.9  ALBUMIN 4.6 3.9    No results for input(s): "LIPASE", "AMYLASE" in the last 168 hours. No results for input(s): "AMMONIA" in the last 168 hours. Coagulation Profile: No results for input(s): "INR", "PROTIME" in the last 168 hours. Cardiac Enzymes: No results for input(s): "CKTOTAL", "CKMB", "CKMBINDEX", "TROPONINI" in the last 168 hours. BNP (last 3 results) No results for input(s): "PROBNP" in the last 8760 hours. HbA1C: No results for input(s): "HGBA1C" in the last 72 hours. CBG: Recent Labs  Lab 06/22/23 1702 06/22/23 2104 06/23/23 0257 06/23/23 0746 06/23/23 1133  GLUCAP 87 175* 304* 243* 98    Lipid Profile: No results for input(s): "CHOL", "HDL", "LDLCALC", "TRIG", "CHOLHDL", "LDLDIRECT" in the last 72 hours. Thyroid Function Tests: No results for input(s): "TSH", "T4TOTAL", "FREET4", "T3FREE", "THYROIDAB" in the last 72 hours. Anemia Panel: No results for input(s): "VITAMINB12", "FOLATE", "FERRITIN", "TIBC", "IRON", "RETICCTPCT" in the last 72 hours. Sepsis Labs: No results for input(s): "PROCALCITON", "LATICACIDVEN" in the last 168 hours.  No results found for this or any previous visit (from the past 240 hour(s)).       Radiology Studies: No results found.      Scheduled Meds:  amLODipine  10 mg Oral Daily   atorvastatin  40 mg Oral Daily   cycloSPORINE  1 drop Both Eyes BID   DULoxetine  60 mg Oral Daily   enoxaparin (LOVENOX) injection  40 mg Subcutaneous Q24H   gabapentin  300 mg Oral TID    insulin aspart  0-20 Units Subcutaneous TID WC   insulin aspart  0-5 Units Subcutaneous QHS   insulin aspart  16 Units Subcutaneous TID WC   insulin glargine-yfgn  15 Units Subcutaneous QHS   insulin glargine-yfgn  45 Units Subcutaneous Q24H   metoprolol tartrate  50 mg Oral BID   pantoprazole  40 mg Oral Daily   Continuous Infusions:  sodium chloride 100 mL/hr at 06/23/23 1219     LOS: 2 days    Time spent: 35 minutes.     Alba Cory, MD [facility]   If 7PM-7AM, please contact night-coverage [email or web address]  06/23/2023, 1:12 PM

## 2023-06-23 NOTE — Inpatient Diabetes Management (Signed)
Inpatient Diabetes Program Recommendations  AACE/ADA: New Consensus Statement on Inpatient Glycemic Control (2015)  Target Ranges:  Prepandial:   less than 140 mg/dL      Peak postprandial:   less than 180 mg/dL (1-2 hours)      Critically ill patients:  140 - 180 mg/dL   Lab Results  Component Value Date   GLUCAP 98 06/23/2023   HGBA1C 13.0 (H) 04/30/2023    Review of Glycemic Control  Diabetes history: DM2 Outpatient Diabetes medications: Novolog 30 units tid with meals Lantus 25 units in the AM and Lantus 45 units in the PM Current orders for Inpatient glycemic control: Semglee 45 units in am and 20 units at bedtime, Novolog 0-20 TID with meals and 0-5 HS + 16 units TID  HgbA1C - 13.0%  Inpatient Diabetes Program Recommendations:    Agree with insulin titration.  Spoke with pt at bedside regarding her diabetes control and importance of getting HgbA1C down to 7-7.5%. nquired about prior A1C and patient reports not being able to recall last A1C value. Discussed A1C results (13.0% on ) and explained that current A1C indicates an average glucose of 326 mg/dl over the past 2-3 months. Pt states she ran out of her rapid-acting insulin, therefore blood sugars were elevated. Discussed glucose and A1C goals. Discussed importance of checking CBGs and maintaining good CBG control to prevent long-term and short-term complications. Explained how hyperglycemia leads to damage within blood vessels which lead to the common complications seen with uncontrolled diabetes. Stressed to the patient the importance of improving glycemic control to prevent further complications from uncontrolled diabetes. Discussed impact of nutrition, exercise, stress, sickness, and medications on diabetes control.  She states she has received an insulin pump in the mail and plans to reach out to the rep in the next week or so. Her next visit with the endocrinology is this month.   Continue to follow.  Thank you. Ailene Ards, RD, LDN, CDCES Inpatient Diabetes Coordinator [phone]

## 2023-06-24 ENCOUNTER — Telehealth: Payer: Self-pay | Admitting: Family Medicine

## 2023-06-24 LAB — GLUCOSE, CAPILLARY
Glucose-Capillary: 188 mg/dL — ABNORMAL HIGH (ref 70–99)
Glucose-Capillary: 180 mg/dL — ABNORMAL HIGH (ref 70–99)
Glucose-Capillary: 145 mg/dL — ABNORMAL HIGH (ref 70–99)

## 2023-06-24 MED ORDER — INSULIN GLARGINE 100 UNIT/ML ~~LOC~~ SOLN: 20.0000 [IU] | Freq: Every day | SUBCUTANEOUS | 11 refills | Status: DC

## 2023-06-24 MED ORDER — NOVOLOG FLEXPEN 100 UNIT/ML ~~LOC~~ SOPN
16.0000 [IU] | PEN_INJECTOR | Freq: Three times a day (TID) | SUBCUTANEOUS | 3 refills | Status: DC
Start: 2023-06-24 — End: 2023-09-12

## 2023-06-24 NOTE — Discharge Summary (Signed)
Physician Discharge Summary   Patient: Whitney Benton MRN: [id number] DOB: 01-25-1971  Admit date:     06/20/2023  Discharge date: 06/24/23  Discharge Physician: Alba Cory   PCP: Marcine Matar, MD   Recommendations at discharge:    Needs further adjustment of insulin regimen, diet education.  Needs follow up with GYN for vaginal bleed.   Discharge Diagnoses: Principal Problem:   DKA, type 1 (HCC) Active Problems:   Depression   Tobacco abuse   AKI (acute kidney injury) (HCC)   Abnormal LFTs   Generalized anxiety disorder   Hyperlipidemia   Essential hypertension  Resolved Problems:   * No resolved hospital problems. *  Hospital Course: 52 year old with past medical history significant for seasonal allergies, anxiety, depression, osteoarthritis, asthma, GERD, glaucoma, hyperlipidemia, diabetes type 1 who presents to the ED complaining of hyperglycemia, polyuria, multiple episode of nausea and vomiting for the last 5 to 6 days prior to admission.  Patient was found to be in DKA with a blood gas pH 7.1, bicarb 14, acid deficit 14, beta hydroxybutyric acid 5.57.  CBG 729   Patient was admitted to the stepdown unit she was treated with insulin drip and subsequently has been transitioned to long-acting insulin.  Assessment and Plan: 1-DKA diabetes type 1: Hyperglycemia, Uncontrolled.  -Patient presented with hyperglycemia CBG 700, bicarb 14, beta be uric acid 5.7. -She was treated with IV fluids and insulin drip. -Workup for infection: UA 0-5 white blood cell not significant for infection, chest x ray: negative.  -in setting of no compliance with diet.  -Continue with  Semglee to 45 units am and 20 units at HS. -Meal coverage to 16 units.  -stable for discharge.   2-AKI: Presents with a creatinine of 1.3.  Prior creatinine ratio 0.7 In the setting of hypovolemia in the setting of DKA. Improved with IV fluids.   Hyponatremia: Pseudohyponatremia in the setting of  hyperglycemia.   Hyperkalemia: In the setting of DKA.  This has resolved.     Depression: Continue with duloxetine   Generalized anxiety disorder: Continue gabapentin, and atarax PRN   Tobacco use: Counseling.    Transaminases: Elevation of alkaline phosphatase in the setting of dehydration.  This has normalized     Hyperlipidemia: Continue with Lipitor.    Essential hypertension: Continue with metoprolol   Hypokalemia; Replaced.  Vaginal bleed. Having small amount of vaginal  bleeding, spotting  Needs follow up with GYN  Estimated body mass index is 37.61 kg/m as calculated from the following:   Height as of this encounter: 5\' 3"  (1.6 m).   Weight as of this encounter: 96.3 kg.        Consultants: None Procedures performed: None Disposition: Home Diet recommendation:  Discharge Diet Orders (From admission, onward)     Start     Ordered   06/24/23 0000  Diet Carb Modified        06/24/23 0942           Carb modified diet DISCHARGE MEDICATION: Allergies as of 06/24/2023       Reactions   Aspirin Hives   Trulicity [dulaglutide] Other (See Comments)   DC'd due to chronic gastric and abdominal pain with nausea   Oxycodone Nausea And Vomiting        Medication List     STOP taking these medications    busPIRone 15 MG tablet Commonly known as: BUSPAR   methylPREDNISolone 4 MG Tbpk tablet Commonly known as: MEDROL DOSEPAK  TAKE these medications    Accu-Chek Guide Me w/Device Kit Use to check blood sugar 3 times daily.   Accu-Chek Guide test strip Generic drug: glucose blood Use to check blood sugar 3 times daily   Accu-Chek Softclix Lancets lancets Use to check blood sugar 3 times daily   Aimovig 140 MG/ML Soaj Generic drug: Erenumab-aooe Inject 140 mg into the skin every 28 (twenty-eight) days.   albuterol 108 (90 Base) MCG/ACT inhaler Commonly known as: VENTOLIN HFA Inhale 2 puffs into the lungs every 6 (six) hours as needed  for wheezing or shortness of breath (Cough). What changed: reasons to take this   amLODipine 10 MG tablet Commonly known as: NORVASC Take 1 tablet (10 mg total) by mouth daily.   atorvastatin 40 MG tablet Commonly known as: LIPITOR Take 1 tablet (40 mg total) by mouth daily.   chlorhexidine 0.12 % solution Commonly known as: PERIDEX Use as directed 10 mLs in the mouth or throat daily.   cycloSPORINE 0.05 % ophthalmic emulsion Commonly known as: Restasis Apply 1 drop into both eyes twice a day   DENTA 5000 PLUS DT Place 1 application  onto teeth See admin instructions. Brush 1 application onto the teeth 2 times a day as directed in place of current toothpaste, then spit out. Do not eat, drink, or rinse after using.   DULoxetine 60 MG capsule Commonly known as: Cymbalta Take 1 capsule (60 mg total) by mouth daily. What changed: Another medication with the same name was removed. Continue taking this medication, and follow the directions you see here.   EYE VITAMINS PO Take 1 tablet by mouth daily.   FreeStyle Libre 3 Sensor Misc Use to check blood sugar continuously throughout the day. Replace sensors once every 14 days. Dx E11.65   gabapentin 300 MG capsule Commonly known as: NEURONTIN Take 1 capsule (300 mg total) by mouth 3 (three) times daily.   hydrOXYzine 25 MG tablet Commonly known as: ATARAX TAKE 1 TABLET (25 MG TOTAL) BY MOUTH 3 (THREE) TIMES DAILY AS NEEDED. What changed:  how much to take reasons to take this   insulin glargine 100 UNIT/ML injection Commonly known as: LANTUS Inject 0.2-0.45 mLs (20-45 Units total) into the skin daily. Inject 45 units into the skin every morning and 20 units every evening. What changed:  how much to take additional instructions Another medication with the same name was removed. Continue taking this medication, and follow the directions you see here.   Lybalvi 15-10 MG Tabs Generic drug: OLANZapine-Samidorphan Take 1 tablet  by mouth daily.   metFORMIN 500 MG 24 hr tablet Commonly known as: GLUCOPHAGE-XR Take 1 tablet (500 mg total) by mouth 2 (two) times daily.   metoprolol tartrate 50 MG tablet Commonly known as: LOPRESSOR Take 1 tablet (50 mg total) by mouth 2 (two) times daily.   NovoLOG FlexPen 100 UNIT/ML FlexPen Generic drug: insulin aspart Inject 16 Units into the skin 3 (three) times daily with meals. What changed: how much to take   omeprazole 40 MG capsule Commonly known as: PRILOSEC TAKE 1 CAPSULE BY MOUTH ONCE DAILY 30 MINUTES BEFORE BREAKFAST What changed: See the new instructions.   ondansetron 4 MG disintegrating tablet Commonly known as: ZOFRAN-ODT Take 1 tablet (4 mg total) by mouth every 8 (eight) hours as needed for nausea or vomiting.   SUMAtriptan 100 MG tablet Commonly known as: IMITREX TAKE 1 TABLET EARLIEST ONSET OF MIGRAINE. MAY REPEAT IN 2 HOURS IF HEADACHE PERSISTS OR RECURS.  MAXIMUM 2 TABLETS IN 24 HOURS. What changed:  how much to take how to take this when to take this additional instructions   TechLite Pen Needles 31G X 5 MM Misc Generic drug: Insulin Pen Needle Use as directed with insulin pen   triamcinolone cream 0.1 % Commonly known as: KENALOG Apply 1 Application topically 2 (two) times daily. What changed:  when to take this reasons to take this   zolpidem 10 MG tablet Commonly known as: Ambien Take 1 tablet (10 mg total) by mouth at bedtime as needed for sleep.        Follow-up Information     Marcine Matar, MD Follow up in 1 week(s).   Specialty: Internal Medicine Contact information: [address] [place] [address] [phone]                Discharge Exam: Ceasar Mons Weights   06/20/23 1022 06/20/23 1500  Weight: 95.7 kg 96.3 kg   General; NAD  Condition at discharge: stable  The results of significant diagnostics from this hospitalization (including imaging, microbiology, ancillary and laboratory) are  listed below for reference.   Imaging Studies: DG Chest 2 View  Result Date: 06/21/2023 CLINICAL DATA:  Nausea and vomiting 1 week with hyperglycemia. EXAM: CHEST - 2 VIEW COMPARISON:  10/06/2022 FINDINGS: Lungs are adequately inflated and otherwise clear. Cardiomediastinal silhouette is normal. Degenerative changes of the spine. IMPRESSION: No active cardiopulmonary disease. Electronically Signed   By: Elberta Fortis M.D.   On: 06/21/2023 14:31    Microbiology: Results for orders placed or performed during the hospital encounter of 04/30/23  MRSA Next Gen by PCR, Nasal     Status: Abnormal   Collection Time: 04/30/23  5:12 PM   Specimen: Nasal Mucosa; Nasal Swab  Result Value Ref Range Status   MRSA by PCR Next Gen DETECTED (A) NOT DETECTED Final    Comment: (NOTE) The GeneXpert MRSA Assay (FDA approved for NASAL specimens only), is one component of a comprehensive MRSA colonization surveillance program. It is not intended to diagnose MRSA infection nor to guide or monitor treatment for MRSA infections. Test performance is not FDA approved in patients less than 39 years old. Performed at [facility], [address], [place], [place] [address]     Labs: CBC: Recent Labs  Lab 06/20/23 1122 06/21/23 0308  WBC 9.4 8.8  NEUTROABS 7.7  --   HGB 15.0 13.7  HCT 47.3* 42.3  MCV 87.4 85.6  PLT 494* 450*   Basic Metabolic Panel: Recent Labs  Lab 06/20/23 1842 06/20/23 2317 06/21/23 0308 06/21/23 1920 06/22/23 0330  NA 136 136 137 133* 136  K 4.4 3.9 3.4* 3.7 3.6  CL 107 107 107 105 109  CO2 14* 17* 21* 18* 20*  GLUCOSE 168* 190* 150* 353* 268*  BUN 15 14 13 17 15   CREATININE 1.03* 0.88 0.93 1.09* 0.99  CALCIUM 10.2 9.8 9.7 9.2 9.0   Liver Function Tests: Recent Labs  Lab 06/20/23 1122 06/21/23 0308  AST 14* 13*  ALT 24 19  ALKPHOS 130* 104  BILITOT 1.2 0.8  PROT 9.5* 7.9  ALBUMIN 4.6 3.9   CBG: Recent Labs  Lab 06/23/23 1656  06/23/23 2132 06/24/23 0535 06/24/23 0754 06/24/23 1131  GLUCAP 107* 190* 188* 180* 145*    Discharge time spent: greater than 30 minutes.  Signed: Alba Cory, MD [facility] 06/24/2023

## 2023-06-24 NOTE — Telephone Encounter (Signed)
PT. Called ion stating that she needs an appointment asap, she had an hysterectomy and is now experiencing some bleeding. Patient is currently in the hospital. I informed patient to give me a minute while I speak with a nurse. Patient hung up. Reached out to patient 3 times. No answer. Message left.

## 2023-06-25 ENCOUNTER — Telehealth: Payer: Self-pay

## 2023-06-25 NOTE — Transitions of Care (Post Inpatient/ED Visit) (Signed)
06/25/2023  Name: Whitney Benton MRN: [id number] DOB: [birth year]  Today's TOC FU Call Status: TOC FU Call Complete Date: 06/25/23  Transition Care Management Follow-up Telephone Call Date of Discharge: 06/24/23 Discharge Facility: [facility]) Type of Discharge: Inpatient Admission Primary Inpatient Discharge Diagnosis:: DKA- type 1 How have you been since you were released from the hospital?: Same Any questions or concerns?: No  Items Reviewed: Did you receive and understand the discharge instructions provided?: Yes Medications obtained,verified, and reconciled?: No Medications Not Reviewed Reasons:: Other: (She said she has all of her medications and did not have any questions about the med regime and she did not need to review the med list at this time. She also has a [employer]) Any new allergies since your discharge?: No Dietary orders reviewed?: Yes Type of Diet Ordered:: heart healthy diabetic Do you have support at home?: Yes People in Home: other relative(s) Name of Support/Comfort Primary Source: She stated she has family that can assist  Medications Reviewed Today: Medications Reviewed Today     Reviewed by Jola Schmidt, CPhT (Pharmacy Technician) on 06/21/23 at [phone]  Med List Status: Complete   Medication Order Taking? Sig Documenting Provider Last Dose Status Informant  Accu-Chek Softclix Lancets lancets [id number]  Use to check blood sugar 3 times daily Marcine Matar, MD  Active Self, Pharmacy Records  albuterol (VENTOLIN HFA) 108 (90 Base) MCG/ACT inhaler [id number] Yes Inhale 2 puffs into the lungs every 6 (six) hours as needed for wheezing or shortness of breath (Cough).  Patient taking differently: Inhale 2 puffs into the lungs every 6 (six) hours as needed for wheezing or shortness of breath (or coughing).   Marcine Matar, MD Unk Active Self, Pharmacy Records  amLODipine [facility]) 10 MG tablet [id number] Yes Take 1 tablet (10 mg total) by  mouth daily. Anders Simmonds, PA-C Past Month Active Self, Pharmacy Records  atorvastatin (LIPITOR) 40 MG tablet [id number] Yes Take 1 tablet (40 mg total) by mouth daily. Anders Simmonds, PA-C Past Month Active Self, Pharmacy Records  Blood Glucose Monitoring Suppl (ACCU-CHEK GUIDE) w/Device KIT [id number]  Use to check blood sugar 3 times daily. Marcine Matar, MD  Active Self, Pharmacy Records  busPIRone (BUSPAR) 15 MG tablet [id number] Yes Take 1 tablet (15 mg total) by mouth 3 (three) times daily. Shanna Cisco, NP Past Month Active Self, Pharmacy Records  chlorhexidine (PERIDEX) 0.12 % solution [id number] Yes Use as directed 10 mLs in the mouth or throat daily. [provider] Past Month Active Self, Pharmacy Records  Continuous Glucose Sensor (FREESTYLE LIBRE 3 SENSOR) [place] [id number]  Use to check blood sugar continuously throughout the day. Replace sensors once every 14 days. Dx E11.65  Patient not taking: Reported on 06/09/2023   Marcine Matar, MD  Active Self, Pharmacy Records           Med Note [place], Camillo Flaming Jun 09, 2023 10:15 AM) PA denied by [facility] due to no improvement in glycemic control since last authorization  cycloSPORINE (RESTASIS) 0.05 % ophthalmic emulsion [id number] Yes Apply 1 drop into both eyes twice a day  Past Month Active Self, Pharmacy Records  DULoxetine (CYMBALTA) 20 MG capsule [id number] Yes Take 1 capsule (20 mg total) by mouth in the morning. Shanna Cisco, NP Past Month Active Self, Pharmacy Records  DULoxetine (CYMBALTA) 60 MG capsule [id number] Yes Take 1 capsule (60 mg total) by mouth daily. Shanna Cisco, NP Past  Month Active Self, Pharmacy Records  Erenumab-aooe (AIMOVIG) 140 MG/ML SOAJ [id number] Yes Inject 140 mg into the skin every 28 (twenty-eight) days. Drema Dallas, DO Unk Active Self, Pharmacy Records  gabapentin (NEURONTIN) 300 MG capsule [id number] Yes Take 1 capsule (300 mg total) by mouth 3 (three) times  daily. Shanna Cisco, NP Past Month Active Self, Pharmacy Records  glucose blood (ACCU-CHEK GUIDE) test strip [id number]  Use to check blood sugar 3 times daily Marcine Matar, MD  Active Self, Pharmacy Records  hydrOXYzine (ATARAX) 25 MG tablet [id number] Yes TAKE 1 TABLET (25 MG TOTAL) BY MOUTH 3 (THREE) TIMES DAILY AS NEEDED.  Patient taking differently: Take 25 mg by mouth 3 (three) times daily as needed for anxiety.   Shanna Cisco, NP Past Month Active Self, Pharmacy Records  insulin aspart (NOVOLOG FLEXPEN) 100 UNIT/ML FlexPen [id number] Yes Inject 30 Units into the skin 3 (three) times daily with meals. Anders Simmonds, PA-C Past Week Active Self, Pharmacy Records  insulin glargine (LANTUS) 100 UNIT/ML injection [id number] Yes Inject 25-45 Units into the skin daily. Inject 25 units into the skin every morning and 45 units every evening. [provider] Past Week Active Self, Pharmacy Records  insulin glargine, 1 Unit Dial, (TOUJEO SOLOSTAR) 300 UNIT/ML Solostar Pen [id number] No Inject 70 Units into the skin daily.  Patient not taking: Reported on 06/21/2023   Marcine Matar, MD Not Taking Active Self, Pharmacy Records  Insulin Pen Needle (PEN NEEDLES 3/16") 31G X 5 MM MISC [id number]  Use as directed with insulin pen Anders Simmonds, PA-C  Active Self, Pharmacy Records  LYBALVI 15-10 MG TABS [id number] Yes Take 1 tablet by mouth daily. [provider] Lajoyce Lauber Self, Pharmacy Records           Med Note Jola Schmidt   TKZ Jun 21, 2023  9:44 AM) Patient states it has been over a month since she has taken.  metFORMIN (GLUCOPHAGE-XR) 500 MG 24 hr tablet [id number] Yes Take 1 tablet (500 mg total) by mouth 2 (two) times daily. Anders Simmonds, PA-C Past Month Active Self, Pharmacy Records  methylPREDNISolone (MEDROL DOSEPAK) 4 MG TBPK tablet [id number] No Take as directed for 6 days  Patient not taking: Reported on 06/21/2023   Pilar Plate,  DPM Completed Course Active Self, Pharmacy Records  metoprolol tartrate (LOPRESSOR) 50 MG tablet [id number] Yes Take 1 tablet (50 mg total) by mouth 2 (two) times daily. Bary Richard 06/13/2023 Un Active Self, Pharmacy Records  Multiple Vitamins-Minerals (EYE VITAMINS PO) [id number] Yes Take 1 tablet by mouth daily. [provider] Past Month Active Self, Pharmacy Records  omeprazole (PRILOSEC) 40 MG capsule [id number] Yes TAKE 1 CAPSULE BY MOUTH ONCE DAILY 30 MINUTES BEFORE BREAKFAST  Patient taking differently: Take 40 mg by mouth daily before breakfast.   Meredith Pel, NP Past Month Active Self, Pharmacy Records  ondansetron (ZOFRAN-ODT) 4 MG disintegrating tablet [id number] Yes Take 1 tablet (4 mg total) by mouth every 8 (eight) hours as needed for nausea or vomiting. Smoot, Freda Jackson Past Month Active Self, Pharmacy Records  Sodium Fluoride (DENTA 5000 PLUS DT) [id number] Yes Place 1 application  onto teeth See admin instructions. Brush 1 application onto the teeth 2 times a day as directed in place of current toothpaste, then spit out. Do not eat, drink, or rinse after using. [provider] Past Month Active Self, Pharmacy Records  SUMAtriptan (IMITREX) 100 MG tablet [id number]  Yes TAKE 1 TABLET EARLIEST ONSET OF MIGRAINE. MAY REPEAT IN 2 HOURS IF HEADACHE PERSISTS OR RECURS. MAXIMUM 2 TABLETS IN 24 HOURS.  Patient taking differently: Take 100 mg by mouth See admin instructions. Take 100 mg by mouth at the earliest onset of migraine- may repeat once in two hours if headache recurs or persists (max of 2 tablets/24 hrs)   Drema Dallas, DO Unk Active Self, Pharmacy Records  triamcinolone cream (KENALOG) 0.1 % [id number] Yes Apply 1 Application topically 2 (two) times daily.  Patient taking differently: Apply 1 Application topically 2 (two) times daily as needed (for irritation on the legs).   Marcine Matar, MD Unk Active Self, Pharmacy Records  zolpidem [facility])  10 MG tablet [id number] Yes Take 1 tablet (10 mg total) by mouth at bedtime as needed for sleep. Shanna Cisco, NP Past Week Active Self, Pharmacy Records            Home Care and Equipment/Supplies: Were Home Health Services Ordered?: No Any new equipment or medical supplies ordered?: No  Functional Questionnaire: Do you need assistance with bathing/showering or dressing?: No Do you need assistance with meal preparation?: No Do you need assistance with eating?: No Do you have difficulty maintaining continence: No Do you need assistance with getting out of bed/getting out of a chair/moving?: No Do you have difficulty managing or taking your medications?: No  Follow up appointments reviewed: PCP Follow-up appointment confirmed?: Yes Date of PCP follow-up appointment?: 07/07/23 Follow-up Provider: [facility] Follow-up appointment confirmed?: Yes Date of Specialist follow-up appointment?: 08/14/23 Follow-Up Specialty Provider:: cardiology Do you need transportation to your follow-up appointment?: No Do you understand care options if your condition(s) worsen?: Yes-patient verbalized understanding    SIGNATURE  Robyne Peers, RN

## 2023-07-01 ENCOUNTER — Other Ambulatory Visit: Payer: Self-pay

## 2023-07-07 ENCOUNTER — Ambulatory Visit: Payer: MEDICAID | Attending: Internal Medicine | Admitting: Internal Medicine

## 2023-07-07 ENCOUNTER — Encounter: Payer: Self-pay | Admitting: Internal Medicine

## 2023-07-07 VITALS — BP 133/87 | HR 91 | Temp 98.1°F | Ht 63.0 in | Wt 220.0 lb

## 2023-07-07 DIAGNOSIS — Z23 Encounter for immunization: Secondary | ICD-10-CM | POA: Diagnosis not present

## 2023-07-07 DIAGNOSIS — E1059 Type 1 diabetes mellitus with other circulatory complications: Secondary | ICD-10-CM

## 2023-07-07 DIAGNOSIS — Z09 Encounter for follow-up examination after completed treatment for conditions other than malignant neoplasm: Secondary | ICD-10-CM

## 2023-07-07 DIAGNOSIS — E1065 Type 1 diabetes mellitus with hyperglycemia: Secondary | ICD-10-CM

## 2023-07-07 DIAGNOSIS — R1110 Vomiting, unspecified: Secondary | ICD-10-CM | POA: Diagnosis not present

## 2023-07-07 DIAGNOSIS — Z794 Long term (current) use of insulin: Secondary | ICD-10-CM

## 2023-07-07 DIAGNOSIS — K769 Liver disease, unspecified: Secondary | ICD-10-CM

## 2023-07-07 DIAGNOSIS — I152 Hypertension secondary to endocrine disorders: Secondary | ICD-10-CM

## 2023-07-07 DIAGNOSIS — F1990 Other psychoactive substance use, unspecified, uncomplicated: Secondary | ICD-10-CM

## 2023-07-07 DIAGNOSIS — F322 Major depressive disorder, single episode, severe without psychotic features: Secondary | ICD-10-CM

## 2023-07-07 DIAGNOSIS — Z7984 Long term (current) use of oral hypoglycemic drugs: Secondary | ICD-10-CM

## 2023-07-07 MED ORDER — ONDANSETRON 4 MG PO TBDP
4.0000 mg | ORAL_TABLET | Freq: Three times a day (TID) | ORAL | 1 refills | Status: DC | PRN
Start: 2023-07-07 — End: 2023-11-12

## 2023-07-07 MED ORDER — INSULIN GLARGINE 100 UNIT/ML ~~LOC~~ SOLN: SUBCUTANEOUS | 11 refills | Status: DC

## 2023-07-07 NOTE — Progress Notes (Signed)
Patient ID: Whitney Benton, female    DOB: 1971/01/03  MRN: [id number]  CC: TOC  hospitalized 6/28 - 06/24/2023 Date of call from case worker 06/25/2023 Hospitalization Follow-up [facility] f/u. Nicki Reaper continuous nausea & vomiting X2 weeks/Pt informed that spots were found in liver during hospital stay/Yes to Tdap vax. )   Subjective: Whitney Benton is a 52 y.o. female who presents for hosp f/u and chronic ds management Her concerns today include:  Pt with hx of depression, migraines (Dr. 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  Started on Norvasc for possible vasospasms  Patient hospitalized 6/28 - 06/24/2023 with DKA.  Blood sugar at the time of admission was 729.  She also had acute renal insufficiency.  She was discharged home on Lantus insulin 45 units in the a.m./20 units in the p.m. and NovoLog 16 units with meals. She has had several other ER visits and a hospitalization prior to that presenting with N/V and abdominal pain.  UDS positive for cocaine and marijuana on ER visit 05/23/2023. She had cholecystectomy 04/02/2023.  However nausea/vomiting and abdominal pain has persisted. Lab Results  Component Value Date   HGBA1C 13.0 (H) 04/30/2023     Today: DM type1: Prior to hospitalization, our clinical pharmacist had changed from Lantus to once daily Toujeo insulin.  Never got Toujeo Reports compliance with insulins and report taking: Lantus 45/25 units and Novolog 18 units TID with meals. Has CGM with phone as reader; just got it last wk.  Prior to that doing finger sticks: reports BS have been up and down -this a.m BS was 225 fasting.  -According to CGM TIR over past 7 days: 36% of the times between 70-180, 39% of the times greater than 250, 24% of the x 181-250. Past 2 weeks: TIR: 70-180 31%, 180-250 33%, greater than 250 36%. Looking at her daily patterns, most of the  times her blood sugars have been above 200. -Sees endocrinologist Dennie Maizes at [facility].  She has not seen her since hospital discharge. Still has insulin pump but has not had it set up yet because she is still having issues with nausea and vomiting. -Blood pressure is elevated today.  She took metoprolol already for the morning.  She is also on Norvasc 10 mg daily.  Had CT scan of the abdomen in May of this year.  It showed 2.3 x 2.5 cm lesion in the left lobe of the liver that was indeterminant.  Radiologist recommended an MRI.  In regards to the positive urine drug screen, she states that the cocaine was a one-time thing when she was around some friends.  Still uses marijuana occasionally  She has positive depression and anxiety screen.  She is followed by [facility] behavioral health.  On Cymbalta.  Denies any suicidal ideation at this time.  HM: Due for Tdap.  Agreeable to receiving this today. Patient Active Problem List   Diagnosis Date Noted   DKA, type 1 (HCC) 06/20/2023   Essential hypertension 06/20/2023   Vitamin D deficiency 06/20/2023   Hyperlipidemia 03/06/2023   Severe recurrent major depressive disorder with psychotic features (HCC) 12/27/2022   Primary insomnia 12/27/2022   Gallstone pancreatitis 12/03/2022   Chronic cholecystitis with calculus 12/03/2022   Morbid obesity (HCC) 04/04/2022   Former smoker 04/30/2021   Major depressive disorder, recurrent episode, moderate (HCC) 04/30/2021   Substance induced  mood disorder (HCC) 03/22/2021   Generalized anxiety disorder 03/22/2021   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   DKA (diabetic ketoacidosis) (HCC) 01/23/2021   Glaucoma suspect 04/12/2020   DKA, type 2, not at goal [facility]) 10/02/2019   AKI (acute kidney injury) (HCC) 10/02/2019   Hyperkalemia 10/02/2019   Leukocytosis 10/02/2019   Abnormal LFTs 10/02/2019   Stressful life events affecting family and household 08/06/2019   New  onset type 2 diabetes mellitus (HCC) 02/04/2019   Morbid obesity with BMI of 40.0-44.9, adult (HCC) 02/04/2019   Tobacco dependence 02/04/2019   Left arm weakness 12/06/2014   Paresthesias/numbness 12/06/2014   Tobacco abuse 11/14/2014   Depression    Mixed incontinence 06/27/2014   History of TVH in 2006 for fibroids and menorrhagia; benign pathology 09/08/2011     Current Outpatient Medications on File Prior to Visit  Medication Sig Dispense Refill   Accu-Chek Softclix Lancets lancets Use to check blood sugar 3 times daily 100 each 6   albuterol (VENTOLIN HFA) 108 (90 Base) MCG/ACT inhaler Inhale 2 puffs into the lungs every 6 (six) hours as needed for wheezing or shortness of breath (Cough). (Patient taking differently: Inhale 2 puffs into the lungs every 6 (six) hours as needed for wheezing or shortness of breath (or coughing).) 18 g 0   amLODipine (NORVASC) 10 MG tablet Take 1 tablet (10 mg total) by mouth daily. 90 tablet 2   atorvastatin (LIPITOR) 40 MG tablet Take 1 tablet (40 mg total) by mouth daily. 90 tablet 2   Blood Glucose Monitoring Suppl (ACCU-CHEK GUIDE) w/Device KIT Use to check blood sugar 3 times daily. 1 kit 0   chlorhexidine (PERIDEX) 0.12 % solution Use as directed 10 mLs in the mouth or throat daily.     Continuous Glucose Sensor (FREESTYLE LIBRE 3 SENSOR) MISC Use to check blood sugar continuously throughout the day. Replace sensors once every 14 days. Dx E11.65 2 each 6   cycloSPORINE (RESTASIS) 0.05 % ophthalmic emulsion Apply 1 drop into both eyes twice a day 180 each 3   DULoxetine (CYMBALTA) 60 MG capsule Take 1 capsule (60 mg total) by mouth daily. 30 capsule 3   Erenumab-aooe (AIMOVIG) 140 MG/ML SOAJ Inject 140 mg into the skin every 28 (twenty-eight) days. 1.12 mL 5   gabapentin (NEURONTIN) 300 MG capsule Take 1 capsule (300 mg total) by mouth 3 (three) times daily. 90 capsule 3   glucose blood (ACCU-CHEK GUIDE) test strip Use to check blood sugar 3 times  daily 100 each 6   hydrOXYzine (ATARAX) 25 MG tablet TAKE 1 TABLET (25 MG TOTAL) BY MOUTH 3 (THREE) TIMES DAILY AS NEEDED. (Patient taking differently: Take 25 mg by mouth 3 (three) times daily as needed for anxiety.) 90 tablet 3   insulin aspart (NOVOLOG FLEXPEN) 100 UNIT/ML FlexPen Inject 16 Units into the skin 3 (three) times daily with meals. 15 mL 3   insulin glargine (LANTUS) 100 UNIT/ML injection Inject 0.2-0.45 mLs (20-45 Units total) into the skin daily. Inject 45 units into the skin every morning and 20 units every evening. 10 mL 11   Insulin Pen Needle (PEN NEEDLES 3/16") 31G X 5 MM MISC Use as directed with insulin pen 100 each 2   LYBALVI 15-10 MG TABS Take 1 tablet by mouth daily.     metFORMIN (GLUCOPHAGE-XR) 500 MG 24 hr tablet Take 1 tablet (500 mg total) by mouth 2 (two) times daily. 180 tablet 0   metoprolol tartrate (LOPRESSOR) 50 MG  tablet Take 1 tablet (50 mg total) by mouth 2 (two) times daily. 90 tablet 0   Multiple Vitamins-Minerals (EYE VITAMINS PO) Take 1 tablet by mouth daily.     omeprazole (PRILOSEC) 40 MG capsule TAKE 1 CAPSULE BY MOUTH ONCE DAILY 30 MINUTES BEFORE BREAKFAST (Patient taking differently: Take 40 mg by mouth daily before breakfast.) 90 capsule 2   ondansetron (ZOFRAN-ODT) 4 MG disintegrating tablet Take 1 tablet (4 mg total) by mouth every 8 (eight) hours as needed for nausea or vomiting. 20 tablet 0   Sodium Fluoride (DENTA 5000 PLUS DT) Place 1 application  onto teeth See admin instructions. Brush 1 application onto the teeth 2 times a day as directed in place of current toothpaste, then spit out. Do not eat, drink, or rinse after using.     SUMAtriptan (IMITREX) 100 MG tablet TAKE 1 TABLET EARLIEST ONSET OF MIGRAINE. MAY REPEAT IN 2 HOURS IF HEADACHE PERSISTS OR RECURS. MAXIMUM 2 TABLETS IN 24 HOURS. (Patient taking differently: Take 100 mg by mouth See admin instructions. Take 100 mg by mouth at the earliest onset of migraine- may repeat once in two hours  if headache recurs or persists (max of 2 tablets/24 hrs)) 10 tablet 5   triamcinolone cream (KENALOG) 0.1 % Apply 1 Application topically 2 (two) times daily. (Patient taking differently: Apply 1 Application topically 2 (two) times daily as needed (for irritation on the legs).) 30 g 0   zolpidem (AMBIEN) 10 MG tablet Take 1 tablet (10 mg total) by mouth at bedtime as needed for sleep. 30 tablet 2   [DISCONTINUED] cetirizine (ZYRTEC) 10 MG tablet Take 1 tablet (10 mg total) by mouth daily. (Patient not taking: No sig reported) 30 tablet 1   No current facility-administered medications on file prior to visit.    Allergies  Allergen Reactions   Aspirin Hives   Trulicity [Dulaglutide] Other (See Comments)    DC'd due to chronic gastric and abdominal pain with nausea   Oxycodone Nausea And Vomiting    Social History   Socioeconomic History   Marital status: Significant Other    Spouse name: Not on file   Number of children: 3   Years of education: 14   Highest education level: Not on file  Occupational History   Not on file  Tobacco Use   Smoking status: Former    Current packs/day: 0.00    Average packs/day: 0.3 packs/day for 8.0 years (2.0 ttl pk-yrs)    Types: Cigarettes    Start date: 2013    Quit date: 2021    Years since quitting: 3.5   Smokeless tobacco: Never  Vaping Use   Vaping status: Never Used  Substance and Sexual Activity   Alcohol use: Not Currently    Comment: occasional   Drug use: Yes    Types: Marijuana    Comment: occ   Sexual activity: Yes    Birth control/protection: Surgical  Other Topics Concern   Not on file  Social History Narrative   Patient lives at home with mother and father , one story    Patient has 3 children    Patient is single   Patient has 14 years of education    Patient is right handed    Caffeine none   Social Determinants of Health   Financial Resource Strain: Not on file  Food Insecurity: Food Insecurity Present  (06/20/2023)   Hunger Vital Sign    Worried About Running Out of Food in the  Last Year: Sometimes true    Ran Out of Food in the Last Year: Sometimes true  Transportation Needs: No Transportation Needs (06/20/2023)   PRAPARE - Administrator, Civil Service (Medical): No    Lack of Transportation (Non-Medical): No  Physical Activity: Not on file  Stress: Not on file  Social Connections: Unknown (03/07/2023)   Received from [facility]   Social Network    Social Network: Not on file  Intimate Partner Violence: Not At Risk (06/20/2023)   Humiliation, Afraid, Rape, and Kick questionnaire    Fear of Current or Ex-Partner: No    Emotionally Abused: No    Physically Abused: No    Sexually Abused: No    Family History  Problem Relation Age of Onset   Diabetes Mother    Mental illness Mother    Depression Mother    Hypertension Mother    Colon cancer Father 68   Hypertension Father    Diabetes Father    Colon cancer Paternal Grandmother    Esophageal cancer Neg Hx    Stomach cancer Neg Hx    Rectal cancer Neg Hx     Past Surgical History:  Procedure Laterality Date   CESAREAN SECTION     UPPER GASTROINTESTINAL ENDOSCOPY     VAGINAL HYSTERECTOMY  12/23/2004   Fibroids, menorrhagia, benign pathology    ROS: Review of Systems Negative except as stated above  PHYSICAL EXAM: BP 133/87 (BP Location: Left Arm, Patient Position: Sitting, Cuff Size: Large)   Pulse 91   Temp 98.1 F (36.7 C) (Oral)   Ht 5\' 3"  (1.6 m)   Wt 220 lb (99.8 kg)   SpO2 98%   BMI 38.97 kg/m   Physical Exam  General appearance - alert, well appearing, middle age AAF and in no distress Mental status - normal mood, behavior, speech, dress, motor activity, and thought processes Neck - supple, no significant adenopathy Chest - clear to auscultation, no wheezes, rales or rhonchi, symmetric air entry Heart - normal rate, regular rhythm, normal S1, S2, no murmurs, rubs, clicks or  gallops Extremities - peripheral pulses normal, no pedal edema, no clubbing or cyanosis     07/07/2023    9:13 AM 06/17/2023   10:37 AM 05/07/2023    2:26 PM  Depression screen PHQ 2/9  Decreased Interest 3  3  Down, Depressed, Hopeless 3  3  PHQ - 2 Score 6  6  Altered sleeping 3  3  Tired, decreased energy 3  3  Change in appetite 3  3  Feeling bad or failure about yourself  3  1  Trouble concentrating 3  1  Moving slowly or fidgety/restless 0  0  Suicidal thoughts 0    PHQ-9 Score 21  17  Difficult doing work/chores        Information is confidential and restricted. Go to Review Flowsheets to unlock data.       Latest Ref Rng & Units 06/22/2023    3:30 AM 06/21/2023    7:20 PM 06/21/2023    3:08 AM  CMP  Glucose 70 - 99 mg/dL 235  573  220   BUN 6 - 20 mg/dL 15  17  13    Creatinine 0.44 - 1.00 mg/dL 2.54  2.70  6.23   Sodium 135 - 145 mmol/L 136  133  137   Potassium 3.5 - 5.1 mmol/L 3.6  3.7  3.4   Chloride 98 - 111 mmol/L 109  105  107   CO2 22 - 32 mmol/L 20  18  21    Calcium 8.9 - 10.3 mg/dL 9.0  9.2  9.7   Total Protein 6.5 - 8.1 g/dL   7.9   Total Bilirubin 0.3 - 1.2 mg/dL   0.8   Alkaline Phos 38 - 126 U/L   104   AST 15 - 41 U/L   13   ALT 0 - 44 U/L   19    Lipid Panel     Component Value Date/Time   CHOL 177 11/30/2021 1039   TRIG 181 (H) 11/30/2021 1039   HDL 40 11/30/2021 1039   CHOLHDL 4.4 11/30/2021 1039   CHOLHDL 3.2 11/14/2014 0707   VLDL 19 11/14/2014 0707   LDLCALC 105 (H) 11/30/2021 1039    CBC    Component Value Date/Time   WBC 8.8 06/21/2023 0308   RBC 4.94 06/21/2023 0308   HGB 13.7 06/21/2023 0308   HGB 13.3 12/12/2022 1036   HCT 42.3 06/21/2023 0308   HCT 40.4 12/12/2022 1036   PLT 450 (H) 06/21/2023 0308   PLT 436 12/12/2022 1036   MCV 85.6 06/21/2023 0308   MCV 86 12/12/2022 1036   MCH 27.7 06/21/2023 0308   MCHC 32.4 06/21/2023 0308   RDW 14.6 06/21/2023 0308   RDW 12.9 12/12/2022 1036   LYMPHSABS 1.1 06/20/2023 1122    LYMPHSABS 2.4 07/14/2019 1058   MONOABS 0.6 06/20/2023 1122   EOSABS 0.0 06/20/2023 1122   EOSABS 0.1 07/14/2019 1058   BASOSABS 0.0 06/20/2023 1122   BASOSABS 0.0 07/14/2019 1058    ASSESSMENT AND PLAN:  1. Hospital discharge follow-up   2. Type 1 diabetes mellitus with hyperglycemia, with long-term current use of insulin (HCC) Not at goal.  She has had issues with non-adherence intermittently of past several mths Recommend changed to [facility] but patient states she still has quite a number of the Lantus pens so she will continue with the Lantus for now.  I recommend change dose to 45 units in the mornings and 30 units in the evenings.  Continue mealtime insulin at 18 units with meals. -Strongly advised that she calls the endocrinologist and schedule a follow-up appointment and discuss starting the insulin pump. -We will check insulin antibodies and glutamic acid decarboxylase antibodies to confirm type I. -I suspect she has some gastroparesis associated with diabetes.  We will order a gastric emptying study.  In the meantime she will continue Zofran with meals as needed. - Microalbumin / creatinine urine ratio - NM GASTRIC EMPTYING; Future - insulin glargine (LANTUS) 100 UNIT/ML injection; Inject 45 units into the skin every morning and 30 units every evening.  Dispense: 10 mL; Refill: 11 - IA-2 Autoantibodies - Glutamic acid decarboxylase auto abs  3. Hypertension associated with type 1 diabetes mellitus (HCC) Close to goal.  Continue amlodipine 10 mg daily and metoprolol.  Take the amlodipine when she returns home today.  Follow-up with clinical pharmacist in 1 month for repeat check  4. Recurrent vomiting -Strongly advised to DC use of marijuana as frequent or daily use can cause recurrent vomiting - ondansetron (ZOFRAN-ODT) 4 MG disintegrating tablet; Take 1 tablet (4 mg total) by mouth every 8 (eight) hours as needed for nausea or vomiting.  Dispense: 30 tablet; Refill: 1 - NM  GASTRIC EMPTYING; Future  5. Liver lesion, left lobe - MR LIVER W CONTRAST; Future  6. Substance use disorder Encouraged her to remain free of all street drugs including  cocaine.  Discuss negative impact it has on her health.  7. Severe major depression without psychotic features (HCC) On Cymbalta.  She will continue to follow-up with her behavioral health specialist.  8. Need for Tdap vaccination Given today.    Patient was given the opportunity to ask questions.  Patient verbalized understanding of the plan and was able to repeat key elements of the plan.   This documentation was completed using Paediatric nurse.  Any transcriptional errors are unintentional.  No orders of the defined types were placed in this encounter.    Requested Prescriptions    No prescriptions requested or ordered in this encounter    No follow-ups on file.  Jonah Blue, MD, FACP

## 2023-07-08 ENCOUNTER — Other Ambulatory Visit: Payer: Self-pay | Admitting: Internal Medicine

## 2023-07-08 DIAGNOSIS — Z1231 Encounter for screening mammogram for malignant neoplasm of breast: Secondary | ICD-10-CM

## 2023-07-09 ENCOUNTER — Other Ambulatory Visit: Payer: Self-pay

## 2023-07-10 LAB — IA-2 AUTOANTIBODIES

## 2023-07-11 ENCOUNTER — Telehealth: Payer: Self-pay | Admitting: Internal Medicine

## 2023-07-11 DIAGNOSIS — E108 Type 1 diabetes mellitus with unspecified complications: Secondary | ICD-10-CM

## 2023-07-11 NOTE — Telephone Encounter (Signed)
Hello [name],  I hope I am doing this right. I wanted to forward this message to you to see if you can resend referral to [facility]?  Thank you,  Anjelica   Copied from CRM [id number]. Topic: Referral - Request for Referral >> Jul 11, 2023  1:35 PM Phill Myron wrote: My endo doctor is no longer in network, can you please write a referral for another endo doctor, preferably [facility].. Thank you

## 2023-07-14 ENCOUNTER — Telehealth: Payer: Self-pay

## 2023-07-14 NOTE — Addendum Note (Signed)
Addended by: Jonah Blue B on: 07/14/2023 06:35 PM   Modules accepted: Orders

## 2023-07-14 NOTE — Telephone Encounter (Signed)
Copied from CRM [id number]. Topic: General - Other >> Jul 14, 2023  3:49 PM Dondra Prader E wrote: Reason for CRM: Pt needs a new location for her Liver MRI, the current location does not accept her medicaid.

## 2023-07-15 ENCOUNTER — Other Ambulatory Visit: Payer: MEDICAID

## 2023-07-15 NOTE — Telephone Encounter (Signed)
Pt does not have a copy of insurance card on file, reach out to patient and inform her to reach out to her insurance company and find out what imaging facility is in her network.

## 2023-07-17 ENCOUNTER — Encounter: Payer: Self-pay | Admitting: Cardiology

## 2023-07-17 LAB — GLUTAMIC ACID DECARBOXYLASE AUTO ABS

## 2023-07-17 NOTE — Telephone Encounter (Signed)
Called but no answer. LVM to call back.  

## 2023-07-18 DIAGNOSIS — F4320 Adjustment disorder, unspecified: Secondary | ICD-10-CM | POA: Diagnosis not present

## 2023-07-18 NOTE — Telephone Encounter (Signed)
Called & spoke to the patient. Verified name & DOB. Patient stated that she has already been contacted by another staff member in regard to MRI. Patient stated she is aware to contact her insurance to look for in-network imaging facilities. No further questions at this time.

## 2023-07-21 ENCOUNTER — Encounter (HOSPITAL_COMMUNITY)
Admission: RE | Admit: 2023-07-21 | Discharge: 2023-07-21 | Disposition: A | Discharge status: Home / Self Care | Payer: MEDICAID | Source: Ambulatory Visit | Attending: Internal Medicine | Admitting: Internal Medicine

## 2023-07-21 DIAGNOSIS — E1065 Type 1 diabetes mellitus with hyperglycemia: Secondary | ICD-10-CM | POA: Insufficient documentation

## 2023-07-21 DIAGNOSIS — R1110 Vomiting, unspecified: Secondary | ICD-10-CM | POA: Insufficient documentation

## 2023-07-21 MED ORDER — TECHNETIUM TC 99M SULFUR COLLOID
2.2000 | Freq: Once | INTRAVENOUS | Status: AC | PRN
  Administered 2023-07-21: 2.2 via ORAL

## 2023-07-23 NOTE — Progress Notes (Signed)
Let patient know that her gastric emptying study confirms that she has mild delayed emptying of stomach content known as gastroparesis.  This is due to prolonged uncontrolled diabetes.  Good diabetes control is very important.  Continue the Zofran as needed prior to meals.

## 2023-07-25 ENCOUNTER — Telehealth: Payer: Self-pay

## 2023-07-25 ENCOUNTER — Other Ambulatory Visit: Payer: Self-pay

## 2023-07-25 NOTE — Telephone Encounter (Signed)
Copied from CRM [id number]. Topic: General - Other >> Jul 25, 2023  2:07 PM Whitney Benton wrote: Reason for CRM: Pt is calling in to see if PCP received the fax that was sent from her insurance [employer] regarding [place] Prior Authorization for her Continuous Glucose Sensor (FREESTYLE LIBRE 3 SENSOR) MISC. Please advise. Pt phone number: [phone]

## 2023-07-28 ENCOUNTER — Other Ambulatory Visit: Payer: Self-pay

## 2023-08-05 ENCOUNTER — Ambulatory Visit: Payer: MEDICAID

## 2023-08-05 NOTE — Telephone Encounter (Signed)
Per Dr Jonah Blue referral was sent to [facility] 07/14/2023.

## 2023-08-06 ENCOUNTER — Ambulatory Visit (INDEPENDENT_AMBULATORY_CARE_PROVIDER_SITE_OTHER): Payer: MEDICAID | Admitting: *Deleted

## 2023-08-06 ENCOUNTER — Other Ambulatory Visit: Payer: Self-pay

## 2023-08-06 ENCOUNTER — Ambulatory Visit (HOSPITAL_COMMUNITY): Payer: MEDICAID | Admitting: Clinical

## 2023-08-06 ENCOUNTER — Other Ambulatory Visit (HOSPITAL_COMMUNITY)
Admission: RE | Admit: 2023-08-06 | Discharge: 2023-08-06 | Disposition: A | Discharge status: Home / Self Care | Payer: MEDICAID | Source: Ambulatory Visit | Attending: Family Medicine | Admitting: Family Medicine

## 2023-08-06 VITALS — BP 130/87 | HR 86 | Ht 63.0 in | Wt 218.5 lb

## 2023-08-06 DIAGNOSIS — N898 Other specified noninflammatory disorders of vagina: Secondary | ICD-10-CM | POA: Diagnosis present

## 2023-08-06 DIAGNOSIS — F331 Major depressive disorder, recurrent, moderate: Secondary | ICD-10-CM | POA: Diagnosis not present
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
## 2023-08-06 NOTE — Progress Notes (Signed)
Pt presents with c/o vaginal itching and dryness since June. She denies having discharge. She has not tried any remedies for these symptoms. Pt states she is currently not sexually active. Pt reports history of hysterectomy however still has ovaries. Self swab obtained and sent to lab.  Pt will be notified of results and treatment indicated if any via Mychart. Pt was also advised to schedule Gyn appt if vaginal sx persist which are not related to abnormal wet prep. She voiced understanding.

## 2023-08-07 ENCOUNTER — Encounter: Payer: Self-pay | Admitting: Pharmacist

## 2023-08-07 ENCOUNTER — Ambulatory Visit: Payer: MEDICAID | Attending: Family Medicine | Admitting: Pharmacist

## 2023-08-07 VITALS — BP 127/84 | HR 64

## 2023-08-07 DIAGNOSIS — I152 Hypertension secondary to endocrine disorders: Secondary | ICD-10-CM | POA: Diagnosis not present

## 2023-08-07 DIAGNOSIS — E1059 Type 1 diabetes mellitus with other circulatory complications: Secondary | ICD-10-CM | POA: Diagnosis not present

## 2023-08-07 LAB — CERVICOVAGINAL ANCILLARY ONLY
Bacterial Vaginitis (gardnerella): POSITIVE — AB
Candida Glabrata: NEGATIVE
Candida Vaginitis: POSITIVE — AB
Comment: NEGATIVE
Comment: NEGATIVE
Comment: NEGATIVE

## 2023-08-07 NOTE — Telephone Encounter (Signed)
Called & spoke to the patient. Verified name & DOB. Informed that the sensors have been approved. Patient expressed verbal understanding. Patient stated that [facility] informed her that they approved the sensors this time but still need a prior authorization. Informed patient to have [employer] resend any forms that may need to be completed. Patient expressed verbal understanding. FYI.

## 2023-08-07 NOTE — Progress Notes (Signed)
   S:     No chief complaint on file.  52 y.o. female who presents for hypertension evaluation, education, and management.  PMH is significant for depression, migraines (Dr. 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    Patient was referred and last seen by Primary Care Provider, Dr. Laural Benes, on 07/07/23.  At last visit, BP was 133/87 mmHg, however, she had not taken her BP medication the day of that visit.   Today, patient arrives in good spirits and presents without assistance. Denies dizziness, headache, blurred vision, swelling.   Patient reports hypertension is longstanding.   Family/Social history:  Fhx: DM, depression, HTN Tobacco: former smoker (quit in 2021) Alcohol: none reported   Medication adherence reported. Patient has taken BP medications today.   Current antihypertensives include: amlodipine 10 mg daily, metoprolol tartrate 50 mg BID  Reported home BP readings: none  Patient reported dietary habits:  -Compliant with sodium restriction -Denies excessive intake of caffeine   Patient-reported exercise habits: none   O:  Vitals:   08/07/23 1508  BP: 127/84  Pulse: 64    Last 3 Office BP readings: BP Readings from Last 3 Encounters:  08/07/23 127/84  08/06/23 130/87  07/07/23 133/87    BMET    Component Value Date/Time   NA 136 06/22/2023 0330   NA 140 12/12/2022 1036   K 3.6 06/22/2023 0330   CL 109 06/22/2023 0330   CO2 20 (L) 06/22/2023 0330   GLUCOSE 268 (H) 06/22/2023 0330   BUN 15 06/22/2023 0330   BUN 10 12/12/2022 1036   CREATININE 0.99 06/22/2023 0330   CALCIUM 9.0 06/22/2023 0330   GFRNONAA >60 06/22/2023 0330   GFRAA >60 08/10/2020 2100    Renal function: CrCl cannot be calculated (Patient's most recent lab result is older than the maximum 21 days allowed.).  Clinical ASCVD: No  event, however, does have a hx of  nonobstructive CAD with a CAC of 1 The 10-year ASCVD risk score (Arnett DK, et al., 2019) is: 11.8%   Values used to calculate the score:     Age: 2 years     Sex: Female     Is Non-Hispanic African American: Yes     Diabetic: Yes     Tobacco smoker: No     Systolic Blood Pressure: 127 mmHg     Is BP treated: Yes     HDL Cholesterol: 40 mg/dL     Total Cholesterol: 177 mg/dL  Patient is participating in a Managed Medicaid Plan: No    A/P: Hypertension diagnosed currently controlled on current medications. BP goal < 130/80 mmHg. Medication adherence appears appropriate.  -Continued current regimen.  -Patient educated on purpose, proper use, and potential adverse effects of amlodipine, metoprolol.  -F/u labs ordered - none today -Counseled on lifestyle modifications for blood pressure control including reduced dietary sodium, increased exercise, adequate sleep. -Encouraged patient to check BP at home and bring log of readings to next visit. Counseled on proper use of home BP cuff.   Results reviewed and written information provided.    Written patient instructions provided. Patient verbalized understanding of treatment plan.  Total time in face to face counseling 30 minutes.    Follow-up:  Pharmacist in 4-6 weeks.  Marland Kitchenluk

## 2023-08-08 DIAGNOSIS — F4320 Adjustment disorder, unspecified: Secondary | ICD-10-CM | POA: Diagnosis not present

## 2023-08-11 ENCOUNTER — Encounter: Payer: Self-pay | Admitting: Pharmacist

## 2023-08-11 ENCOUNTER — Other Ambulatory Visit: Payer: Self-pay

## 2023-08-11 ENCOUNTER — Ambulatory Visit: Payer: MEDICAID | Attending: Internal Medicine | Admitting: Pharmacist

## 2023-08-11 ENCOUNTER — Other Ambulatory Visit: Payer: Self-pay | Admitting: Pharmacist

## 2023-08-11 DIAGNOSIS — Z794 Long term (current) use of insulin: Secondary | ICD-10-CM

## 2023-08-11 DIAGNOSIS — Z7984 Long term (current) use of oral hypoglycemic drugs: Secondary | ICD-10-CM

## 2023-08-11 DIAGNOSIS — E1065 Type 1 diabetes mellitus with hyperglycemia: Secondary | ICD-10-CM

## 2023-08-11 MED ORDER — ACCU-CHEK SOFTCLIX LANCETS MISC
6 refills | Status: AC
  Filled 2023-08-11: qty 100, 33d supply, fill #0

## 2023-08-11 MED ORDER — ACCU-CHEK GUIDE W/DEVICE KIT
PACK | 0 refills | Status: AC
Start: 2023-08-11 — End: ?
  Filled 2023-08-11: qty 1, fill #0

## 2023-08-11 MED ORDER — ACCU-CHEK GUIDE VI STRP
ORAL_STRIP | 6 refills | Status: AC
  Filled 2023-08-11: qty 100, 33d supply, fill #0

## 2023-08-11 NOTE — Progress Notes (Signed)
I connected with  Whitney Benton on 08/11/23 by a telemedicine application and verified that I am speaking with the correct person using two identifiers.   I discussed the limitations of evaluation and management by telemedicine. The patient expressed understanding and agreed to proceed.  S:    PCP: Dr. Laural Benes  No chief complaint on file.  Patient is in good spirits.  Presents for diabetes evaluation, education, and management. She came to the front today with complaints of hypoglycemia that started a couple of days ago. I was unable to work her in person, but I placed her on my schedule for a telephone visit.   Today she endorses hypoglycemia over the weekend. Gives readings as low as 50 mg/dL. Treats successfully. Reports compliance with her insulin but had to skip her dose last night d/t pre-prandial glucose being low. She is not skipping meals, however, she admits that she is eating what she is supposed to and attributes her hypo readings to this.   Family/Social History: Sxh: Former Smoker         Occasional Drinking Fhx: Mother DM, HTN, Depression         Father: DM, HTN  Insurance coverage/medication affordability: [employer]  Medication adherence reported.  Current diabetes medications include: metformin 500 mg XR BID, Novolog 18 units TID before meals, Basaglar 45 units in the morning and 25 units in the evening.   Patient reported dietary habits: Patient reported trying to eat more vegetables and less carbs.  Patient-reported exercise habits: Patient reported not being able to do exercise at the time.    O:  CGM sensor was displaced over the weekend.   Lab Results  Component Value Date   HGBA1C 13.0 (H) 04/30/2023   There were no vitals filed for this visit.  Lipid Panel     Component Value Date/Time   CHOL 177 11/30/2021 1039   TRIG 181 (H) 11/30/2021 1039   HDL 40 11/30/2021 1039   CHOLHDL 4.4 11/30/2021 1039   CHOLHDL 3.2 11/14/2014 0707   VLDL 19  11/14/2014 0707   LDLCALC 105 (H) 11/30/2021 1039    Clinical Atherosclerotic Cardiovascular Disease (ASCVD): No  The 10-year ASCVD risk score (Arnett DK, et al., 2019) is: 11.8%   Values used to calculate the score:     Age: 52 years     Sex: Female     Is Non-Hispanic African American: Yes     Diabetic: Yes     Tobacco smoker: No     Systolic Blood Pressure: 127 mmHg     Is BP treated: Yes     HDL Cholesterol: 40 mg/dL     Total Cholesterol: 177 mg/dL    A/P: Diabetes longstanding currently uncontrolled, however, home sugar levels are improving drastically. Her GMI from her Josephine Igo report is 8.9%, self-reported. I worked with her pharmacy to get an override for an early sensor refill. Patient is able to verbalize appropriate hypoglycemia management plan. Medication adherence appears optimal. Commended her on her improvement! Will decrease her insulin in the setting of hypoglycemia -Decreased dose of Lantus to 42 units in the morning and 22 units in the evening.   -Decreased Novolog to 16 units TID before meals.  -Continue metformin 500 mg XR BID.  -Extensively discussed pathophysiology of diabetes, recommended lifestyle interventions, dietary effects on blood sugar control -Counseled on s/sx of and management of hypoglycemia.  -Next A1C anticipated 07/2023.   Written patient instructions provided.  Total time in face to face  counseling 30 minutes.   Follow up Pharmacist visit in one month.  Butch Penny, PharmD, Patsy [place], CPP Clinical Pharmacist [facility] [phone]

## 2023-08-12 ENCOUNTER — Other Ambulatory Visit: Payer: Self-pay | Admitting: Certified Nurse Midwife

## 2023-08-12 DIAGNOSIS — B9689 Other specified bacterial agents as the cause of diseases classified elsewhere: Secondary | ICD-10-CM

## 2023-08-12 DIAGNOSIS — B3731 Acute candidiasis of vulva and vagina: Secondary | ICD-10-CM

## 2023-08-12 MED ORDER — TERCONAZOLE 0.4 % VA CREA: 1.0000 | TOPICAL_CREAM | Freq: Every day | VAGINAL | 0 refills | Status: DC

## 2023-08-12 MED ORDER — METRONIDAZOLE 500 MG PO TABS
500.0000 mg | ORAL_TABLET | Freq: Two times a day (BID) | ORAL | 0 refills | Status: DC
Start: 2023-08-12 — End: 2023-12-24

## 2023-08-14 ENCOUNTER — Ambulatory Visit: Payer: Medicaid Other | Admitting: Cardiology

## 2023-08-18 NOTE — Addendum Note (Signed)
Encounter addended by: Lestine Mount, RT on: 08/18/2023 3:47 PM  Actions taken: Imaging Exam ended, Charge Capture section accepted

## 2023-08-21 ENCOUNTER — Telehealth: Payer: Self-pay | Admitting: Neurology

## 2023-08-21 ENCOUNTER — Other Ambulatory Visit (HOSPITAL_COMMUNITY): Payer: Self-pay

## 2023-08-21 ENCOUNTER — Telehealth: Payer: Self-pay

## 2023-08-21 NOTE — Telephone Encounter (Signed)
Pharmacy Patient Advocate Encounter   Received notification from Pt Calls Messages that prior authorization for Aimovig 140MG /ML auto-injectors is required/requested.   Insurance verification completed.   The patient is insured through [facility] .   Per test claim: PA required; PA submitted to [employer] via CoverMyMeds Key/confirmation #/EOC [id number] Status is pending

## 2023-08-21 NOTE — Telephone Encounter (Signed)
Patient is having a migraine for 3 weeks , Patient was wondering about a injection but havent heard anything back.

## 2023-08-21 NOTE — Telephone Encounter (Signed)
PA request has been Submitted. New Encounter created for follow up. For additional info see Pharmacy Prior Auth telephone encounter from 08/21/2023.

## 2023-08-21 NOTE — Telephone Encounter (Signed)
LMOVm will have PA team start a new PA for Aimovig

## 2023-08-26 ENCOUNTER — Other Ambulatory Visit: Payer: Self-pay

## 2023-08-28 ENCOUNTER — Other Ambulatory Visit (HOSPITAL_COMMUNITY): Payer: Self-pay

## 2023-08-28 NOTE — Telephone Encounter (Signed)
Pharmacy Patient Advocate Encounter   Prior authorization for Aimovig 140MG /ML auto-injectors is required/requested.   Insurance verification completed.   The patient is insured through [facility].   Prior Authorization form/request asks a question that requires your assistance. Please see the question below and advise accordingly.   Patient has not been seen in the office since 03-11-2023.

## 2023-08-29 NOTE — Telephone Encounter (Signed)
Patient never started the Aimovig, not aware of approval.   Will discuss ast her visit.

## 2023-09-02 ENCOUNTER — Encounter (HOSPITAL_COMMUNITY): Payer: Self-pay | Admitting: Psychiatry

## 2023-09-02 ENCOUNTER — Ambulatory Visit (INDEPENDENT_AMBULATORY_CARE_PROVIDER_SITE_OTHER): Payer: Medicaid Other | Admitting: Psychiatry

## 2023-09-02 VITALS — BP 127/81 | HR 111 | Temp 98.6°F | Wt 227.6 lb

## 2023-09-02 DIAGNOSIS — F333 Major depressive disorder, recurrent, severe with psychotic symptoms: Secondary | ICD-10-CM

## 2023-09-02 DIAGNOSIS — F411 Generalized anxiety disorder: Secondary | ICD-10-CM | POA: Diagnosis not present

## 2023-09-02 DIAGNOSIS — F5101 Primary insomnia: Secondary | ICD-10-CM

## 2023-09-02 DIAGNOSIS — F1994 Other psychoactive substance use, unspecified with psychoactive substance-induced mood disorder: Secondary | ICD-10-CM

## 2023-09-02 MED ORDER — LYBALVI 15-10 MG PO TABS
1.0000 | ORAL_TABLET | Freq: Every day | ORAL | 3 refills | Status: DC
Start: 2023-09-02 — End: 2023-11-18

## 2023-09-02 MED ORDER — DULOXETINE HCL 60 MG PO CPEP
60.0000 mg | ORAL_CAPSULE | Freq: Every day | ORAL | 3 refills | Status: DC
Start: 2023-09-02 — End: 2023-11-18

## 2023-09-02 MED ORDER — HYDROXYZINE HCL 50 MG PO TABS
50.0000 mg | ORAL_TABLET | Freq: Three times a day (TID) | ORAL | 3 refills | Status: DC
Start: 2023-09-02 — End: 2023-11-18

## 2023-09-02 MED ORDER — ZOLPIDEM TARTRATE 10 MG PO TABS: 10.0000 mg | ORAL_TABLET | Freq: Every evening | ORAL | 2 refills | Status: DC | PRN

## 2023-09-02 MED ORDER — GABAPENTIN 300 MG PO CAPS
300.0000 mg | ORAL_CAPSULE | Freq: Three times a day (TID) | ORAL | 3 refills | Status: DC
Start: 2023-09-02 — End: 2023-11-18

## 2023-09-02 NOTE — Progress Notes (Signed)
BH MD/PA/NP OP Progress Note        09/02/2023 12:12 PM Whitney Benton  MRN:  [id number]  Chief Complaint: "I'm sleeping better but I'm gaining weight"   HPI: 52 year old female seen today for follow up psychiatric evaluation.  She has a psychiatric history of insomnia, anxiety and depression.  Patient was hospitalized on 06/20/2023-06/24/2023. Per chart review  patient was experiencing hyperglycemia, polyuria, multiple episodes of nausea and vomiting for 5 to 6 days. Patient notes that her psychiatric medications were discontinued and she was started on Doxepin. She notes that she continued Doxepin 25 mg  after her discharge and restarted her prior psychiatric medications. She is currently managed on Lybalvi mg nightly, hydroxyzine 25 mg three times daily as needed, gabapentin 300 mg 3 times daily, Ambien 10 mg nightly, and Cymbalta 60 mg daily (reports taking 80). Today she notes her medications are somewhat effective in managing her psychiatric condition.   Today was well-groomed, pleasant, cooperative, and engaged in conversation.  She informed Clinical research associate that she is sleeping 6 hours nightly but notes that she continues to wake up frequently.  She informed Clinical research associate that doxepin is somewhat effective in managing her sleep however notes that she wishes to discontinue it since it caused her to have an increased appetite and increased weight.  Since her last visit she informed writer that she has gained 16 pounds.  Patient also informed writer that she continues to be anxious and depressed.  Today provider conducted a GAD-7 and patient scored an 18, at her last visit she scored a 21.  She notes that she worries about her health, her children, and finances. Provider also conducted PHQ-9 and patient scored a 20, at her last visit she scored a 20.  Patient continues to have auditory hallucinations, increased irritability, fluctuations in mood, and racing thoughts.    Provider asked patient if she has been using  cocaine as she is struggled with this in the past.  She informed Clinical research associate that she used cocaine a few months ago but has not used it since.  She does endorse smoking marijuana.  Provider informed patient that the substances can exacerbate her mental health.  She endorsed understanding and agreed.  Today she denies SI/HI/VH or paranoia.  Patient informed Clinical research associate that she has been living with her son since her hospitalization.  She notes that he and his wife recently had a new child.  She reports that she is enjoying her new grandson.  Patient informed Clinical research associate that her sister and her sister boyfriend continues to live in her home and notes that this is stressing her out.  Patient informed Clinical research associate that she was unable to go to her sleep study.  Provider reordered a sleep study for patient and she notes that she sleeps 6 hours but continues to wake up throughout the night.  At this time patient does not wish to restart doxepin due to weight gain.  She informed Clinical research associate that she would take doxepin 1 week and Ambien the next as she was uncertain if she could take both together.  She has not been taking the Lybalvi.  Provider encouraged patient to restart Lybalvi to help manage mood and symptoms of psychosis as well as sleep.  Ambien filled today however provider informed patient that it will be reduced or discontinued at her next visit.  She endorsed understanding and agreed.  She we will follow-up with outpatient counseling for therapy.  No other concerns at this time.  . Visit Diagnosis:  ICD-10-CM   1. Substance induced mood disorder (HCC)  F19.94 LYBALVI 15-10 MG TABS    2. Generalized anxiety disorder  F41.1 DULoxetine (CYMBALTA) 60 MG capsule    gabapentin (NEURONTIN) 300 MG capsule    hydrOXYzine (ATARAX) 50 MG tablet    3. Severe recurrent major depressive disorder with psychotic features (HCC)  F33.3 DULoxetine (CYMBALTA) 60 MG capsule    gabapentin (NEURONTIN) 300 MG capsule    LYBALVI 15-10 MG TABS     4. Primary insomnia  F51.01 Ambulatory referral to Sleep Studies    zolpidem (AMBIEN) 10 MG tablet        Past Psychiatric History: anxiety and depression  Past Medical History:  Past Medical History:  Diagnosis Date   Allergy    Anxiety    Arthritis    Asthma    Depression    Diabetes mellitus without complication (HCC)    GERD (gastroesophageal reflux disease)    Glaucoma suspect    Hyperlipidemia    Trichomonas infection     Past Surgical History:  Procedure Laterality Date   CESAREAN SECTION     UPPER GASTROINTESTINAL ENDOSCOPY     VAGINAL HYSTERECTOMY  12/23/2004   Fibroids, menorrhagia, benign pathology    Family Psychiatric History: Mother schizophrenia and bipolar disorder  Family History:  Family History  Problem Relation Age of Onset   Diabetes Mother    Mental illness Mother    Depression Mother    Hypertension Mother    Colon cancer Father 67   Hypertension Father    Diabetes Father    Colon cancer Paternal Grandmother    Esophageal cancer Neg Hx    Stomach cancer Neg Hx    Rectal cancer Neg Hx     Social History:  Social History   Socioeconomic History   Marital status: Significant Other    Spouse name: Not on file   Number of children: 3   Years of education: 14   Highest education level: Not on file  Occupational History   Not on file  Tobacco Use   Smoking status: Former    Current packs/day: 0.00    Average packs/day: 0.3 packs/day for 8.0 years (2.0 ttl pk-yrs)    Types: Cigarettes    Start date: 2013    Quit date: 2021    Years since quitting: 3.6   Smokeless tobacco: Never  Vaping Use   Vaping status: Never Used  Substance and Sexual Activity   Alcohol use: Not Currently    Comment: occasional   Drug use: Yes    Types: Marijuana    Comment: occ   Sexual activity: Yes    Birth control/protection: Surgical  Other Topics Concern   Not on file  Social History Narrative   Patient lives at home with mother and father ,  one story    Patient has 3 children    Patient is single   Patient has 14 years of education    Patient is right handed    Caffeine none   Social Determinants of Health   Financial Resource Strain: Not on file  Food Insecurity: Food Insecurity Present (06/20/2023)   Hunger Vital Sign    Worried About Running Out of Food in the Last Year: Sometimes true    Ran Out of Food in the Last Year: Sometimes true  Transportation Needs: No Transportation Needs (06/20/2023)   PRAPARE - Administrator, Civil Service (Medical): No    Lack of Transportation (Non-Medical):  No  Physical Activity: Not on file  Stress: Not on file  Social Connections: Unknown (03/07/2023)   Received from [facility], [facility]   Social Network    Social Network: Not on file    Allergies:  Allergies  Allergen Reactions   Aspirin Hives   Trulicity [Dulaglutide] Other (See Comments)    DC'd due to chronic gastric and abdominal pain with nausea   Oxycodone Nausea And Vomiting    Metabolic Disorder Labs: Lab Results  Component Value Date   HGBA1C 13.0 (H) 04/30/2023   MPG 326.4 04/30/2023   MPG 246 07/25/2022   No results found for: "PROLACTIN" Lab Results  Component Value Date   CHOL 177 11/30/2021   TRIG 181 (H) 11/30/2021   HDL 40 11/30/2021   CHOLHDL 4.4 11/30/2021   VLDL 19 11/14/2014   LDLCALC 105 (H) 11/30/2021   LDLCALC 92 02/04/2020   Lab Results  Component Value Date   TSH 2.460 04/30/2021   TSH 0.014 (L) 10/02/2019    Therapeutic Level Labs: No results found for: "LITHIUM" No results found for: "VALPROATE" No results found for: "CBMZ"  Current Medications: Current Outpatient Medications  Medication Sig Dispense Refill   Accu-Chek Softclix Lancets lancets Use to check blood sugar 3 times daily 100 each 6   albuterol (VENTOLIN HFA) 108 (90 Base) MCG/ACT inhaler Inhale 2 puffs into the lungs every 6 (six) hours as needed for wheezing or shortness of breath (Cough).  (Patient not taking: Reported on 08/06/2023) 18 g 0   amLODipine (NORVASC) 10 MG tablet Take 1 tablet (10 mg total) by mouth daily. (Patient not taking: Reported on 08/06/2023) 90 tablet 2   atorvastatin (LIPITOR) 40 MG tablet Take 1 tablet (40 mg total) by mouth daily. 90 tablet 2   Blood Glucose Monitoring Suppl (ACCU-CHEK GUIDE) w/Device KIT Use to check blood sugar 3 times daily. 1 kit 0   Continuous Glucose Sensor (FREESTYLE LIBRE 3 SENSOR) MISC Use to check blood sugar continuously throughout the day. Replace sensors once every 14 days. Dx E11.65 2 each 6   cycloSPORINE (RESTASIS) 0.05 % ophthalmic emulsion Apply 1 drop into both eyes twice a day 180 each 3   DULoxetine (CYMBALTA) 60 MG capsule Take 1 capsule (60 mg total) by mouth daily. 30 capsule 3   Erenumab-aooe (AIMOVIG) 140 MG/ML SOAJ Inject 140 mg into the skin every 28 (twenty-eight) days. (Patient not taking: Reported on 08/06/2023) 1.12 mL 5   gabapentin (NEURONTIN) 300 MG capsule Take 1 capsule (300 mg total) by mouth 3 (three) times daily. 90 capsule 3   glucose blood (ACCU-CHEK GUIDE) test strip Use to check blood sugar 3 times daily 100 each 6   hydrOXYzine (ATARAX) 50 MG tablet Take 1 tablet (50 mg total) by mouth 3 (three) times daily. 90 tablet 3   insulin aspart (NOVOLOG FLEXPEN) 100 UNIT/ML FlexPen Inject 16 Units into the skin 3 (three) times daily with meals. (Patient not taking: Reported on 08/06/2023) 15 mL 3   insulin glargine (LANTUS) 100 UNIT/ML injection Inject 45 units into the skin every morning and 30 units every evening. (Patient not taking: Reported on 08/06/2023) 10 mL 11   Insulin Pen Needle (PEN NEEDLES 3/16") 31G X 5 MM MISC Use as directed with insulin pen 100 each 2   LYBALVI 15-10 MG TABS Take 1 tablet by mouth daily. 15 tablet 3   metFORMIN (GLUCOPHAGE-XR) 500 MG 24 hr tablet Take 1 tablet (500 mg total) by mouth 2 (two)  times daily. 180 tablet 0   metoprolol tartrate (LOPRESSOR) 50 MG tablet Take 1 tablet  (50 mg total) by mouth 2 (two) times daily. 90 tablet 0   metroNIDAZOLE (FLAGYL) 500 MG tablet Take 1 tablet (500 mg total) by mouth 2 (two) times daily. 14 tablet 0   Multiple Vitamins-Minerals (EYE VITAMINS PO) Take 1 tablet by mouth daily.     omeprazole (PRILOSEC) 40 MG capsule TAKE 1 CAPSULE BY MOUTH ONCE DAILY 30 MINUTES BEFORE BREAKFAST (Patient taking differently: Take 40 mg by mouth daily before breakfast.) 90 capsule 2   ondansetron (ZOFRAN-ODT) 4 MG disintegrating tablet Take 1 tablet (4 mg total) by mouth every 8 (eight) hours as needed for nausea or vomiting. (Patient not taking: Reported on 08/06/2023) 30 tablet 1   SUMAtriptan (IMITREX) 100 MG tablet TAKE 1 TABLET EARLIEST ONSET OF MIGRAINE. MAY REPEAT IN 2 HOURS IF HEADACHE PERSISTS OR RECURS. MAXIMUM 2 TABLETS IN 24 HOURS. (Patient not taking: Reported on 08/06/2023) 10 tablet 5   terconazole (TERAZOL 7) 0.4 % vaginal cream Place 1 applicator vaginally at bedtime. 45 g 0   triamcinolone cream (KENALOG) 0.1 % Apply 1 Application topically 2 (two) times daily. (Patient not taking: Reported on 08/06/2023) 30 g 0   zolpidem (AMBIEN) 10 MG tablet Take 1 tablet (10 mg total) by mouth at bedtime as needed for sleep. 30 tablet 2   No current facility-administered medications for this visit.     Musculoskeletal: Strength & Muscle Tone: within normal limits Gait & Station: normal Patient leans: N/A  Psychiatric Specialty Exam: Review of Systems  There were no vitals taken for this visit.There is no height or weight on file to calculate BMI.  General Appearance: Well Groomed  Eye Contact:  Good  Speech:  Clear and Coherent  Volume:  Normal  Mood:  Anxious and Depressed  Affect:  Appropriate and Congruent  Thought Process:  Coherent, Goal Directed, and Linear  Orientation:  Full (Time, Place, and Person)  Thought Content: Logical and Hallucinations: Auditory,   Suicidal Thoughts:  No  Homicidal Thoughts:  No  Memory:  Immediate;    Good Recent;   Good Remote;   Good  Judgement:  Good  Insight:  Good  Psychomotor Activity:  Normal  Concentration:  Concentration: Good and Attention Span: Good  Recall:  Good  Fund of Knowledge: Good  Language: Good  Akathisia:  No  Handed:  Right  AIMS (if indicated): not done  Assets:  Communication Skills Desire for Improvement Housing Intimacy Physical Health Social Support  ADL's:  Intact  Cognition: WNL  Sleep:  Poor   Screenings: GAD-7    Flowsheet Row Clinical Support from 09/02/2023 in [facility] Office Visit from 07/07/2023 in [facility] Clinical Support from 06/17/2023 in [facility] Office Visit from 05/07/2023 in [facility] Video Visit from 03/19/2023 in [facility]  Total GAD-7 Score 18 21 21 21 21       PHQ2-9    Flowsheet Row Clinical Support from 09/02/2023 in [facility] Office Visit from 07/07/2023 in [facility] Clinical Support from 06/17/2023 in [facility] Office Visit from 05/07/2023 in [facility] Counselor from 04/15/2023 in [facility]  PHQ-2 Total Score 6 6 6 6 6   PHQ-9 Total Score 20 21 20  17 22      Flowsheet Row ED to Hosp-Admission (Discharged) from 06/20/2023 in [facility] ED from 05/23/2023 in [facility] Emergency Department at [facility] ED from 05/19/2023 in [facility] Emergency Department at [facility]  C-SSRS RISK CATEGORY No Risk No Risk No Risk        Assessment and Plan: Patient endorses symptoms of marijuana use, anxiety, depression, insomnia (and AH. Patient informed Clinical research associate that she was unable to go to her sleep study.  Provider reordered a sleep study for patient and she notes that she  sleeps 6 hours but continues to wake up throughout the night.  At this time patient does not wish to restart doxepin due to weight gain.  She has not been taking the Lybalvi.  Provider encouraged patient to restart Lybalvi to help manage mood and symptoms of psychosis as well as sleep.  Ambien filled today however provider informed patient that it will be reduced or discontinued at her next visit   1. Generalized anxiety disorder  Continue- DULoxetine (CYMBALTA) 60 MG capsule; Take 1 capsule (60 mg total) by mouth daily.  Dispense: 30 capsule; Refill: 3 Continue- gabapentin (NEURONTIN) 300 MG capsule; Take 1 capsule (300 mg total) by mouth 3 (three) times daily.  Dispense: 90 capsule; Refill: 3 Continue- hydrOXYzine (ATARAX) 25 MG tablet; TAKE 1 TABLET (25 MG TOTAL) BY MOUTH 3 (THREE) TIMES DAILY AS NEEDED.  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  Dispense: 30 capsule; Refill: 3 Continue- gabapentin (NEURONTIN) 300 MG capsule; Take 1 capsule (300 mg total) by mouth 3 (three) times daily.  Dispense: 90 capsule; Refill: 3  3. Primary insomnia  Increased- zolpidem (AMBIEN) 10 MG tablet; Take 1 tablet (10 mg total) by mouth at bedtime as needed for sleep.  Dispense: 30 tablet; Refill: 2 - Ambulatory referral to Sleep Studies    Follow-up in 2.5 months Follow-up with therapy   Shanna Cisco, NP 09/02/2023, 12:12 PM

## 2023-09-04 DIAGNOSIS — E139 Other specified diabetes mellitus without complications: Secondary | ICD-10-CM | POA: Diagnosis not present

## 2023-09-04 DIAGNOSIS — E785 Hyperlipidemia, unspecified: Secondary | ICD-10-CM | POA: Diagnosis not present

## 2023-09-04 DIAGNOSIS — I10 Essential (primary) hypertension: Secondary | ICD-10-CM | POA: Diagnosis not present

## 2023-09-08 ENCOUNTER — Encounter: Payer: Self-pay | Admitting: Internal Medicine

## 2023-09-08 ENCOUNTER — Other Ambulatory Visit: Payer: Self-pay | Admitting: Internal Medicine

## 2023-09-08 ENCOUNTER — Ambulatory Visit: Payer: Medicaid Other | Attending: Internal Medicine | Admitting: Internal Medicine

## 2023-09-08 DIAGNOSIS — F1994 Other psychoactive substance use, unspecified with psychoactive substance-induced mood disorder: Secondary | ICD-10-CM

## 2023-09-08 DIAGNOSIS — I152 Hypertension secondary to endocrine disorders: Secondary | ICD-10-CM | POA: Diagnosis not present

## 2023-09-08 DIAGNOSIS — Z2821 Immunization not carried out because of patient refusal: Secondary | ICD-10-CM

## 2023-09-08 DIAGNOSIS — E1069 Type 1 diabetes mellitus with other specified complication: Secondary | ICD-10-CM

## 2023-09-08 DIAGNOSIS — K769 Liver disease, unspecified: Secondary | ICD-10-CM | POA: Diagnosis not present

## 2023-09-08 DIAGNOSIS — E1059 Type 1 diabetes mellitus with other circulatory complications: Secondary | ICD-10-CM

## 2023-09-08 DIAGNOSIS — E559 Vitamin D deficiency, unspecified: Secondary | ICD-10-CM

## 2023-09-08 DIAGNOSIS — Z23 Encounter for immunization: Secondary | ICD-10-CM

## 2023-09-08 DIAGNOSIS — Z7984 Long term (current) use of oral hypoglycemic drugs: Secondary | ICD-10-CM | POA: Diagnosis not present

## 2023-09-08 DIAGNOSIS — E1143 Type 2 diabetes mellitus with diabetic autonomic (poly)neuropathy: Secondary | ICD-10-CM

## 2023-09-08 DIAGNOSIS — Z6841 Body Mass Index (BMI) 40.0 and over, adult: Secondary | ICD-10-CM | POA: Diagnosis not present

## 2023-09-08 DIAGNOSIS — E119 Type 2 diabetes mellitus without complications: Secondary | ICD-10-CM

## 2023-09-08 DIAGNOSIS — K3184 Gastroparesis: Secondary | ICD-10-CM | POA: Diagnosis not present

## 2023-09-08 LAB — POCT GLYCOSYLATED HEMOGLOBIN (HGB A1C): HbA1c, POC (controlled diabetic range): 10.5 % — AB (ref 0.0–7.0)

## 2023-09-08 LAB — GLUCOSE, POCT (MANUAL RESULT ENTRY): POC Glucose: 333 mg/dL — AB (ref 70–99)

## 2023-09-08 MED ORDER — METFORMIN HCL ER 500 MG PO TB24
500.0000 mg | ORAL_TABLET | Freq: Two times a day (BID) | ORAL | 0 refills | Status: DC
Start: 2023-09-08 — End: 2023-12-03

## 2023-09-08 NOTE — Progress Notes (Signed)
Patient ID: Whitney Benton, female    DOB: March 26, 1971  MRN: [id number]  CC: Diabetes (DM f/u. Med refill. Nicki Reaper taking Buspar but unsure of dosage/Requesting supplements to boost energy X2 mo/No to flu vax. No to shingles vax. )   Subjective: Whitney Benton is a 52 y.o. female who presents for chronic ds management. Her concerns today include:  Pt with hx of depression, migraines (Dr. 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  Started on Norvasc for possible vasospasms   DM: Results for orders placed or performed in visit on 09/08/23  POCT glycosylated hemoglobin (Hb A1C)  Result Value Ref Range   Hemoglobin A1C     HbA1c POC (<> result, manual entry)     HbA1c, POC (prediabetic range)     HbA1c, POC (controlled diabetic range) 10.5 (A) 0.0 - 7.0 %  POCT glucose (manual entry)  Result Value Ref Range   POC Glucose 333 (A) 70 - 99 mg/dl  W0J Improved from 4 months ago when it was 13 Seen by clinical pharmacist 1 month ago.  Reported some hypoglycemic episodes.  Lantus was decreased to 42 units a.m./22 units p.m. and NovoLog decreased from 18 units to 16 units with meals.  Metformin XR 500 mg twice a day was continued. -saw her endocrinologist 09/05/2023.  Reports Lantus dose increased 45 a.m/30 units p.m and Novolog to 18 units with meals.  Pt declined insulin pump. -reports she is trying to be more consistent with taking insulins.   -has Libre device.  Blood sugar has been in target range 24 to 25% of the times in the past 1-2 weeks with no low blood sugar episodes.  Blood sugars greater than 250 between 43 to 45% of the times within the past 1 to 2 weeks -Reports doing better with eating habits -going to gym 2x/wk to use elliptical, TM and stationary bike -taking Zofran PRN for mild gastroparesis -wants something to help  boast her energy.  Takes a MV and B 12 supplement.  Hx of Vit D def.  Wonders whether she needs to be back on high dose vit D once a wk.  HTN:  took Norvasc 10 mg and Metoprolol already for this a.m No device to check BP.  Still using marijuana but not every day.  Feels it helps reduce stress.  Not using cocaine at all.  Still plugged in with [facility] and taking her meds.  MRI liver scheduled for 10/05/2023 to eval liver lesion seen on CT several mths ago  HM:  declines COVID booster, flu shot and shingles vaccines   Patient Active Problem List   Diagnosis Date Noted   DKA, type 1 (HCC) 06/20/2023   Essential hypertension 06/20/2023   Vitamin D deficiency 06/20/2023   Hyperlipidemia 03/06/2023   Primary insomnia 12/27/2022   Gallstone pancreatitis 12/03/2022   Chronic cholecystitis with calculus 12/03/2022   Morbid obesity (HCC) 04/04/2022   Former smoker 04/30/2021   Major depressive disorder, recurrent episode, moderate (HCC) 04/30/2021   Substance induced mood disorder (HCC) 03/22/2021   Generalized anxiety disorder 03/22/2021   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   DKA (diabetic ketoacidosis) (HCC) 01/23/2021   Glaucoma suspect 04/12/2020   DKA, type 2, not at goal [facility]) 10/02/2019   AKI (acute kidney injury) (HCC) 10/02/2019   Hyperkalemia 10/02/2019   Leukocytosis 10/02/2019  Abnormal LFTs 10/02/2019   Stressful life events affecting family and household 08/06/2019   New onset type 2 diabetes mellitus (HCC) 02/04/2019   Morbid obesity with BMI of 40.0-44.9, adult (HCC) 02/04/2019   Tobacco dependence 02/04/2019   Left arm weakness 12/06/2014   Paresthesias/numbness 12/06/2014   Tobacco abuse 11/14/2014   Depression    Mixed incontinence 06/27/2014   History of TVH in 2006 for fibroids and menorrhagia; benign pathology 09/08/2011     Current Outpatient Medications on File Prior to Visit  Medication Sig Dispense Refill   Accu-Chek Softclix Lancets  lancets Use to check blood sugar 3 times daily 100 each 6   atorvastatin (LIPITOR) 40 MG tablet Take 1 tablet (40 mg total) by mouth daily. 90 tablet 2   Blood Glucose Monitoring Suppl (ACCU-CHEK GUIDE) w/Device KIT Use to check blood sugar 3 times daily. 1 kit 0   cycloSPORINE (RESTASIS) 0.05 % ophthalmic emulsion Apply 1 drop into both eyes twice a day 180 each 3   DULoxetine (CYMBALTA) 60 MG capsule Take 1 capsule (60 mg total) by mouth daily. 30 capsule 3   gabapentin (NEURONTIN) 300 MG capsule Take 1 capsule (300 mg total) by mouth 3 (three) times daily. 90 capsule 3   glucose blood (ACCU-CHEK GUIDE) test strip Use to check blood sugar 3 times daily 100 each 6   hydrOXYzine (ATARAX) 50 MG tablet Take 1 tablet (50 mg total) by mouth 3 (three) times daily. 90 tablet 3   Insulin Pen Needle (PEN NEEDLES 3/16") 31G X 5 MM MISC Use as directed with insulin pen 100 each 2   LYBALVI 15-10 MG TABS Take 1 tablet by mouth daily. 15 tablet 3   metoprolol tartrate (LOPRESSOR) 50 MG tablet Take 1 tablet (50 mg total) by mouth 2 (two) times daily. 90 tablet 0   metroNIDAZOLE (FLAGYL) 500 MG tablet Take 1 tablet (500 mg total) by mouth 2 (two) times daily. 14 tablet 0   Multiple Vitamins-Minerals (EYE VITAMINS PO) Take 1 tablet by mouth daily.     terconazole (TERAZOL 7) 0.4 % vaginal cream Place 1 applicator vaginally at bedtime. 45 g 0   zolpidem (AMBIEN) 10 MG tablet Take 1 tablet (10 mg total) by mouth at bedtime as needed for sleep. 30 tablet 2   albuterol (VENTOLIN HFA) 108 (90 Base) MCG/ACT inhaler Inhale 2 puffs into the lungs every 6 (six) hours as needed for wheezing or shortness of breath (Cough). (Patient not taking: Reported on 08/06/2023) 18 g 0   amLODipine (NORVASC) 10 MG tablet Take 1 tablet (10 mg total) by mouth daily. (Patient not taking: Reported on 08/06/2023) 90 tablet 2   Continuous Glucose Sensor (FREESTYLE LIBRE 3 SENSOR) MISC Use to check blood sugar continuously throughout the day.  Replace sensors once every 14 days. Dx E11.65 (Patient not taking: Reported on 09/08/2023) 2 each 6   Erenumab-aooe (AIMOVIG) 140 MG/ML SOAJ Inject 140 mg into the skin every 28 (twenty-eight) days. (Patient not taking: Reported on 08/06/2023) 1.12 mL 5   insulin aspart (NOVOLOG FLEXPEN) 100 UNIT/ML FlexPen Inject 16 Units into the skin 3 (three) times daily with meals. (Patient not taking: Reported on 08/06/2023) 15 mL 3   insulin glargine (LANTUS) 100 UNIT/ML injection Inject 45 units into the skin every morning and 30 units every evening. (Patient not taking: Reported on 08/06/2023) 10 mL 11   omeprazole (PRILOSEC) 40 MG capsule TAKE 1 CAPSULE BY MOUTH ONCE DAILY 30 MINUTES BEFORE BREAKFAST (Patient not taking:  Reported on 09/08/2023) 90 capsule 2   ondansetron (ZOFRAN-ODT) 4 MG disintegrating tablet Take 1 tablet (4 mg total) by mouth every 8 (eight) hours as needed for nausea or vomiting. (Patient not taking: Reported on 08/06/2023) 30 tablet 1   SUMAtriptan (IMITREX) 100 MG tablet TAKE 1 TABLET EARLIEST ONSET OF MIGRAINE. MAY REPEAT IN 2 HOURS IF HEADACHE PERSISTS OR RECURS. MAXIMUM 2 TABLETS IN 24 HOURS. (Patient not taking: Reported on 08/06/2023) 10 tablet 5   triamcinolone cream (KENALOG) 0.1 % Apply 1 Application topically 2 (two) times daily. (Patient not taking: Reported on 08/06/2023) 30 g 0   [DISCONTINUED] cetirizine (ZYRTEC) 10 MG tablet Take 1 tablet (10 mg total) by mouth daily. (Patient not taking: No sig reported) 30 tablet 1   No current facility-administered medications on file prior to visit.    Allergies  Allergen Reactions   Aspirin Hives   Trulicity [Dulaglutide] Other (See Comments)    DC'd due to chronic gastric and abdominal pain with nausea   Oxycodone Nausea And Vomiting    Social History   Socioeconomic History   Marital status: Significant Other    Spouse name: Not on file   Number of children: 3   Years of education: 14   Highest education level: Not on file   Occupational History   Not on file  Tobacco Use   Smoking status: Former    Current packs/day: 0.00    Average packs/day: 0.3 packs/day for 8.0 years (2.0 ttl pk-yrs)    Types: Cigarettes    Start date: 2013    Quit date: 2021    Years since quitting: 3.7   Smokeless tobacco: Never  Vaping Use   Vaping status: Never Used  Substance and Sexual Activity   Alcohol use: Not Currently    Comment: occasional   Drug use: Yes    Types: Marijuana    Comment: occ   Sexual activity: Yes    Birth control/protection: Surgical  Other Topics Concern   Not on file  Social History Narrative   Patient lives at home with mother and father , one story    Patient has 3 children    Patient is single   Patient has 14 years of education    Patient is right handed    Caffeine none   Social Determinants of Health   Financial Resource Strain: Not on file  Food Insecurity: Food Insecurity Present (06/20/2023)   Hunger Vital Sign    Worried About Running Out of Food in the Last Year: Sometimes true    Ran Out of Food in the Last Year: Sometimes true  Transportation Needs: No Transportation Needs (06/20/2023)   PRAPARE - Administrator, Civil Service (Medical): No    Lack of Transportation (Non-Medical): No  Physical Activity: Not on file  Stress: Not on file  Social Connections: Unknown (03/07/2023)   Received from [facility]   Social Network    Social Network: Not on file  Intimate Partner Violence: Not At Risk (06/20/2023)   Humiliation, Afraid, Rape, and Kick questionnaire    Fear of Current or Ex-Partner: No    Emotionally Abused: No    Physically Abused: No    Sexually Abused: No    Family History  Problem Relation Age of Onset   Diabetes Mother    Mental illness Mother    Depression Mother    Hypertension Mother    Colon cancer Father 45   Hypertension Father  Diabetes Father    Colon cancer Paternal Grandmother    Esophageal cancer Neg Hx     Stomach cancer Neg Hx    Rectal cancer Neg Hx     Past Surgical History:  Procedure Laterality Date   CESAREAN SECTION     UPPER GASTROINTESTINAL ENDOSCOPY     VAGINAL HYSTERECTOMY  12/23/2004   Fibroids, menorrhagia, benign pathology    ROS: Review of Systems Negative except as stated above  PHYSICAL EXAM: BP 112/77   Pulse (!) 103   Ht 5\' 3"  (1.6 m)   Wt 228 lb (103.4 kg)   SpO2 91%   BMI 40.39 kg/m   Wt Readings from Last 3 Encounters:  09/08/23 228 lb (103.4 kg)  09/02/23 227 lb 9.6 oz (103.2 kg)  08/06/23 218 lb 8 oz (99.1 kg)    Physical Exam  General appearance - alert, well appearing, and in no distress Mental status - normal mood, behavior, speech, dress, motor activity, and thought processes Chest - clear to auscultation, no wheezes, rales or rhonchi, symmetric air entry Heart - normal rate, regular rhythm, normal S1, S2, no murmurs, rubs, clicks or gallops Extremities - peripheral pulses normal, no pedal edema, no clubbing or cyanosis Diabetic Foot Exam - Simple   Simple Foot Form Diabetic Foot exam was performed with the following findings: Yes 09/08/2023 10:18 AM  Visual Inspection See comments: Yes Sensation Testing Intact to touch and monofilament testing bilaterally: Yes Pulse Check Posterior Tibialis and Dorsalis pulse intact bilaterally: Yes Comments Some peeling of the skin on dorsal surface of toes right foot.         Latest Ref Rng & Units 06/22/2023    3:30 AM 06/21/2023    7:20 PM 06/21/2023    3:08 AM  CMP  Glucose 70 - 99 mg/dL 161  096  045   BUN 6 - 20 mg/dL 15  17  13    Creatinine 0.44 - 1.00 mg/dL 4.09  8.11  9.14   Sodium 135 - 145 mmol/L 136  133  137   Potassium 3.5 - 5.1 mmol/L 3.6  3.7  3.4   Chloride 98 - 111 mmol/L 109  105  107   CO2 22 - 32 mmol/L 20  18  21    Calcium 8.9 - 10.3 mg/dL 9.0  9.2  9.7   Total Protein 6.5 - 8.1 g/dL   7.9   Total Bilirubin 0.3 - 1.2 mg/dL   0.8   Alkaline Phos 38 - 126 U/L   104   AST  15 - 41 U/L   13   ALT 0 - 44 U/L   19    Lipid Panel     Component Value Date/Time   CHOL 177 11/30/2021 1039   TRIG 181 (H) 11/30/2021 1039   HDL 40 11/30/2021 1039   CHOLHDL 4.4 11/30/2021 1039   CHOLHDL 3.2 11/14/2014 0707   VLDL 19 11/14/2014 0707   LDLCALC 105 (H) 11/30/2021 1039    CBC    Component Value Date/Time   WBC 8.8 06/21/2023 0308   RBC 4.94 06/21/2023 0308   HGB 13.7 06/21/2023 0308   HGB 13.3 12/12/2022 1036   HCT 42.3 06/21/2023 0308   HCT 40.4 12/12/2022 1036   PLT 450 (H) 06/21/2023 0308   PLT 436 12/12/2022 1036   MCV 85.6 06/21/2023 0308   MCV 86 12/12/2022 1036   MCH 27.7 06/21/2023 0308   MCHC 32.4 06/21/2023 0308   RDW 14.6 06/21/2023 0308  RDW 12.9 12/12/2022 1036   LYMPHSABS 1.1 06/20/2023 1122   LYMPHSABS 2.4 07/14/2019 1058   MONOABS 0.6 06/20/2023 1122   EOSABS 0.0 06/20/2023 1122   EOSABS 0.1 07/14/2019 1058   BASOSABS 0.0 06/20/2023 1122   BASOSABS 0.0 07/14/2019 1058    ASSESSMENT AND PLAN:  1. Type 1 diabetes mellitus with morbid obesity (HCC) A1c improved but still not at goal. Encouraged her to continue trying to eat healthy.  She is agreeable to referral to nutritionist. I have not made any changes in her insulins as she just saw her endocrinologist 4 days ago and insulin was adjusted.  She will continue Lantus dose increased 45 a.m/30 units p.m and Novolog to 18 units with meals and Metformin XR 500 mg twice a day Encouraged her to try to move as much as she can. - POCT glycosylated hemoglobin (Hb A1C) - POCT glucose (manual entry) - metFORMIN (GLUCOPHAGE-XR) 500 MG 24 hr tablet; Take 1 tablet (500 mg total) by mouth 2 (two) times daily.  Dispense: 180 tablet; Refill: 0 - Amb ref to Medical Nutrition Therapy-MNT - Microalbumin / creatinine urine ratio  2. Diabetes mellitus treated with oral medication (HCC) See #1 above.  3. Diabetic gastroparesis (HCC) Using Zofran as needed.  4. Hypertension associated with type 1  diabetes mellitus (HCC) At goal.  Continue Norvasc 10 mg daily and metoprolol  5. Substance induced mood disorder (HCC) Reports that she is not using cocaine.  Still uses marijuana intermittently.  She feels it helps decrease stress.  She is plugged in with behavioral health.  6. Vitamin D deficiency - VITAMIN D 25 Hydroxy (Vit-D Deficiency, Fractures)  7. Liver lesion, left lobe Keep upcoming appointment for MRI of the liver  8. Influenza vaccination declined Recommended.  Patient declined flu vaccine, COVID-19 vaccine booster and shingles vaccine.    Patient was given the opportunity to ask questions.  Patient verbalized understanding of the plan and was able to repeat key elements of the plan.   This documentation was completed using Paediatric nurse.  Any transcriptional errors are unintentional.  Orders Placed This Encounter  Procedures   Microalbumin / creatinine urine ratio   VITAMIN D 25 Hydroxy (Vit-D Deficiency, Fractures)   Amb ref to Medical Nutrition Therapy-MNT   POCT glycosylated hemoglobin (Hb A1C)   POCT glucose (manual entry)     Requested Prescriptions   Signed Prescriptions Disp Refills   metFORMIN (GLUCOPHAGE-XR) 500 MG 24 hr tablet 180 tablet 0    Sig: Take 1 tablet (500 mg total) by mouth 2 (two) times daily.    No follow-ups on file.  Jonah Blue, MD, FACP

## 2023-09-09 LAB — SPECIMEN STATUS REPORT

## 2023-09-09 NOTE — Progress Notes (Unsigned)
NEUROLOGY FOLLOW UP OFFICE NOTE  Whitney Benton [id number]  Assessment/Plan:   1  Migraine without aura, Without Status Migrainosus, Not Intractable 2  Bilateral carpal tunnel syndrome   Migraine prevention:  Aimovig 140mg  *** Migraine rescue:  Sumatriptan 100mg   *** Take cyclobenzaprine as needed for neck pain Limit use of pain relievers to no more than 2 days out of week to prevent risk of rebound or medication-overuse headache. Keep headache diary Try to optimize glycemic control (Hgb A1c goal less than 8) so can undergo carpal tunnel surgery.  In meantime, use wrist splints.   Follow up ***     Subjective:  Whitney Benton is a 52 year old right-handed female with diabetes who follows up for migraines and bilateral occipital neuralgia.     UPDATE: Since last visit, she hasn't been able to pick up Aimovig.  She was told that refills were "suspended".  Headaches are now daily Intensity:  Moderate to severe Duration:  30 minutes with sumatriptan but will return after a couple of hours Frequency:  daily  Using wrist splints.  Most recent Hgb A1c from 9/16 was 10.5. Insulin pump ***   Rescue therapy:  sumatriptan 100mg    Current NSAIDS:  none - GI bleed Current analgesics:  none Current triptans:  sumatriptan 100mg  Current ergotamine:  none Current anti-emetic:  Zofran ODT 4mg  Current muscle relaxants:  Flexeril 10mg  PRN Current anti-anxiolytic:  hydroxyzine Current sleep aide:  melatonin Current Antihypertensive medications:  metoprolol tartrate Current Antidepressant medications:  Cymbalta 60mg  daily Current Anticonvulsant medications:  none Current anti-CGRP:  none Current Vitamins/Herbal/Supplements:  Melatonin, B12 Current Antihistamines/Decongestants:  none Other therapy:  none Hormone/birth control:  none   Caffeine:  Cut down on coffee.  Now only decaff.  No soda Diet:  Drinks water with electrolytes.  Does not skip meals Exercise:  Walks 30 minutes  daily Depression:  yes; Anxiety:  yes Other pain:  no Sleep hygiene:  poor   HISTORY:  Migraines since 2000 but returned around 2019.  Initial workup in early 2000s included lumbar puncture but unsure of the results.  She doesn't remember if she had an MRI of the brain.  They are severe sharp and pounding pain in back of head bilaterally and radiates to the front.  She has associated flashes in her vision, photophobia, phonophobia, osmophobia, feels off-balance and sometimes nausea but no  Autonomic symptoms, numbness or weakness.  They usually last 2-3 weeks.  They occur about twice a year, usually in Spring and Summer but can occur other times.  Triggers unknown.  Nothing really relieves them.   CT brain on 06/14/2020 personally reviewed was normal. Eye exam in 2021 okay.  In November 2015, she began experiencing left arm weakness and pain.  She reports numbness in the fingertips and palm of both hands.  MRI of cervical spine 11/14/2014 showed multilevel disc degeneration and spondylosis with bilateral mild foraminal stenosis and mild spinal stenosis at C5-6.  She had a NCV-EMG which revealed bilateral carpal tunnel syndrome.  She was referred to a hand specialist at that time but never followed up.  She continued to have some numbness in left hand and later involving the right hand.  NCV-EMG on 07/23/2022 revealed evidence of bilateral moderate to severe carpal tunnel syndrome.  Referred to hand specialist.  Surgery was recommended but not until Hgb A1c is under 8.0.   Past NSAIDS:  Meloxicam, ketorolac, ibuprofen, naproxen Past analgesics:  Tylenol (elevated liver function), Excedrin Past  abortive triptans:  rizatriptan 10mg .  Sumatriptan NS too expensive. Past abortive ergotamine:  none Past muscle relaxants:  Robaxin Past anti-emetic:  promethazine Past antihypertensive medications:  none Past antidepressant medications:  Nortriptyline, sertraline 50mg  Past anticonvulsant medications:   topiramate 100mg  Past anti-CGRP:  none Past vitamins/Herbal/Supplements:  none Past antihistamines/decongestants:  Benadryl, Zyrtec Other past therapies:  none     Family history of headache:  no  PAST MEDICAL HISTORY: Past Medical History:  Diagnosis Date   Allergy    Anxiety    Arthritis    Asthma    Depression    Diabetes mellitus without complication (HCC)    GERD (gastroesophageal reflux disease)    Glaucoma suspect    Hyperlipidemia    Trichomonas infection     MEDICATIONS: Current Outpatient Medications on File Prior to Visit  Medication Sig Dispense Refill   Accu-Chek Softclix Lancets lancets Use to check blood sugar 3 times daily 100 each 6   albuterol (VENTOLIN HFA) 108 (90 Base) MCG/ACT inhaler Inhale 2 puffs into the lungs every 6 (six) hours as needed for wheezing or shortness of breath (Cough). (Patient not taking: Reported on 08/06/2023) 18 g 0   amLODipine (NORVASC) 10 MG tablet Take 1 tablet (10 mg total) by mouth daily. (Patient not taking: Reported on 08/06/2023) 90 tablet 2   atorvastatin (LIPITOR) 40 MG tablet Take 1 tablet (40 mg total) by mouth daily. 90 tablet 2   Blood Glucose Monitoring Suppl (ACCU-CHEK GUIDE) w/Device KIT Use to check blood sugar 3 times daily. 1 kit 0   Continuous Glucose Sensor (FREESTYLE LIBRE 3 SENSOR) MISC Use to check blood sugar continuously throughout the day. Replace sensors once every 14 days. Dx E11.65 (Patient not taking: Reported on 09/08/2023) 2 each 6   cycloSPORINE (RESTASIS) 0.05 % ophthalmic emulsion Apply 1 drop into both eyes twice a day 180 each 3   DULoxetine (CYMBALTA) 60 MG capsule Take 1 capsule (60 mg total) by mouth daily. 30 capsule 3   Erenumab-aooe (AIMOVIG) 140 MG/ML SOAJ Inject 140 mg into the skin every 28 (twenty-eight) days. (Patient not taking: Reported on 08/06/2023) 1.12 mL 5   gabapentin (NEURONTIN) 300 MG capsule Take 1 capsule (300 mg total) by mouth 3 (three) times daily. 90 capsule 3   glucose  blood (ACCU-CHEK GUIDE) test strip Use to check blood sugar 3 times daily 100 each 6   hydrOXYzine (ATARAX) 50 MG tablet Take 1 tablet (50 mg total) by mouth 3 (three) times daily. 90 tablet 3   insulin aspart (NOVOLOG FLEXPEN) 100 UNIT/ML FlexPen Inject 16 Units into the skin 3 (three) times daily with meals. (Patient not taking: Reported on 08/06/2023) 15 mL 3   insulin glargine (LANTUS) 100 UNIT/ML injection Inject 45 units into the skin every morning and 30 units every evening. (Patient not taking: Reported on 08/06/2023) 10 mL 11   Insulin Pen Needle (PEN NEEDLES 3/16") 31G X 5 MM MISC Use as directed with insulin pen 100 each 2   LYBALVI 15-10 MG TABS Take 1 tablet by mouth daily. 15 tablet 3   metFORMIN (GLUCOPHAGE-XR) 500 MG 24 hr tablet Take 1 tablet (500 mg total) by mouth 2 (two) times daily. 180 tablet 0   metoprolol tartrate (LOPRESSOR) 50 MG tablet Take 1 tablet (50 mg total) by mouth 2 (two) times daily. 90 tablet 0   metroNIDAZOLE (FLAGYL) 500 MG tablet Take 1 tablet (500 mg total) by mouth 2 (two) times daily. 14 tablet 0  Multiple Vitamins-Minerals (EYE VITAMINS PO) Take 1 tablet by mouth daily.     omeprazole (PRILOSEC) 40 MG capsule TAKE 1 CAPSULE BY MOUTH ONCE DAILY 30 MINUTES BEFORE BREAKFAST (Patient not taking: Reported on 09/08/2023) 90 capsule 2   ondansetron (ZOFRAN-ODT) 4 MG disintegrating tablet Take 1 tablet (4 mg total) by mouth every 8 (eight) hours as needed for nausea or vomiting. (Patient not taking: Reported on 08/06/2023) 30 tablet 1   SUMAtriptan (IMITREX) 100 MG tablet TAKE 1 TABLET EARLIEST ONSET OF MIGRAINE. MAY REPEAT IN 2 HOURS IF HEADACHE PERSISTS OR RECURS. MAXIMUM 2 TABLETS IN 24 HOURS. (Patient not taking: Reported on 08/06/2023) 10 tablet 5   terconazole (TERAZOL 7) 0.4 % vaginal cream Place 1 applicator vaginally at bedtime. 45 g 0   triamcinolone cream (KENALOG) 0.1 % Apply 1 Application topically 2 (two) times daily. (Patient not taking: Reported on  08/06/2023) 30 g 0   zolpidem (AMBIEN) 10 MG tablet Take 1 tablet (10 mg total) by mouth at bedtime as needed for sleep. 30 tablet 2   [DISCONTINUED] cetirizine (ZYRTEC) 10 MG tablet Take 1 tablet (10 mg total) by mouth daily. (Patient not taking: No sig reported) 30 tablet 1   No current facility-administered medications on file prior to visit.    ALLERGIES: Allergies  Allergen Reactions   Aspirin Hives   Trulicity [Dulaglutide] Other (See Comments)    DC'd due to chronic gastric and abdominal pain with nausea   Oxycodone Nausea And Vomiting    FAMILY HISTORY: Family History  Problem Relation Age of Onset   Diabetes Mother    Mental illness Mother    Depression Mother    Hypertension Mother    Colon cancer Father 19   Hypertension Father    Diabetes Father    Colon cancer Paternal Grandmother    Esophageal cancer Neg Hx    Stomach cancer Neg Hx    Rectal cancer Neg Hx       Objective:  *** General: No acute distress.  Patient appears well-groomed.   Head:  Normocephalic/atraumatic Eyes:  Fundi examined but not visualized Neck: supple, no paraspinal tenderness, full range of motion Heart:  Regular rate and rhythm Neurological Exam: ***   Shon Millet, DO  CC: Jonah Blue, MD

## 2023-09-10 ENCOUNTER — Other Ambulatory Visit: Payer: Self-pay

## 2023-09-10 ENCOUNTER — Other Ambulatory Visit: Payer: Self-pay | Admitting: Internal Medicine

## 2023-09-10 DIAGNOSIS — E1069 Type 1 diabetes mellitus with other specified complication: Secondary | ICD-10-CM

## 2023-09-10 MED ORDER — VITAMIN D (CHOLECALCIFEROL) 10 MCG (400 UNIT) PO CAPS
400.0000 [IU]/d | ORAL_CAPSULE | Freq: Every day | ORAL | 1 refills | Status: DC
Start: 2023-09-10 — End: 2024-04-30
  Filled 2023-09-10: qty 100, fill #0

## 2023-09-11 ENCOUNTER — Encounter: Payer: Self-pay | Admitting: Neurology

## 2023-09-11 ENCOUNTER — Ambulatory Visit (INDEPENDENT_AMBULATORY_CARE_PROVIDER_SITE_OTHER): Payer: Medicaid Other | Admitting: Neurology

## 2023-09-11 ENCOUNTER — Other Ambulatory Visit: Payer: Self-pay

## 2023-09-11 VITALS — BP 125/86 | HR 111 | Ht 65.0 in | Wt 225.0 lb

## 2023-09-11 DIAGNOSIS — M5481 Occipital neuralgia: Secondary | ICD-10-CM

## 2023-09-11 DIAGNOSIS — G43709 Chronic migraine without aura, not intractable, without status migrainosus: Secondary | ICD-10-CM

## 2023-09-11 DIAGNOSIS — M542 Cervicalgia: Secondary | ICD-10-CM

## 2023-09-11 DIAGNOSIS — G5603 Carpal tunnel syndrome, bilateral upper limbs: Secondary | ICD-10-CM | POA: Diagnosis not present

## 2023-09-11 MED ORDER — AIMOVIG 140 MG/ML ~~LOC~~ SOAJ: 140.0000 mg | SUBCUTANEOUS | 5 refills | Status: DC

## 2023-09-11 NOTE — Patient Instructions (Signed)
Start Aimovig injection every 28 days Sumatriptan as needed for migraine attacks Refer to physical therapy for treatment of OCCIPITAL NEURALGIA Limit use of pain relievers to no more than 2 days out of week to prevent risk of rebound or medication-overuse headache. Keep headache diary

## 2023-09-12 ENCOUNTER — Other Ambulatory Visit: Payer: Self-pay

## 2023-09-12 ENCOUNTER — Ambulatory Visit: Payer: Self-pay

## 2023-09-12 ENCOUNTER — Other Ambulatory Visit (HOSPITAL_COMMUNITY): Payer: Self-pay

## 2023-09-12 ENCOUNTER — Telehealth: Payer: Self-pay | Admitting: Pharmacist

## 2023-09-12 DIAGNOSIS — E1065 Type 1 diabetes mellitus with hyperglycemia: Secondary | ICD-10-CM

## 2023-09-12 DIAGNOSIS — E1169 Type 2 diabetes mellitus with other specified complication: Secondary | ICD-10-CM

## 2023-09-12 MED ORDER — LANTUS SOLOSTAR 100 UNIT/ML ~~LOC~~ SOPN
PEN_INJECTOR | SUBCUTANEOUS | 11 refills | Status: DC
  Filled 2023-09-12: qty 15, 20d supply, fill #0

## 2023-09-12 MED ORDER — INSULIN LISPRO (1 UNIT DIAL) 100 UNIT/ML (KWIKPEN)
16.0000 [IU] | PEN_INJECTOR | Freq: Three times a day (TID) | SUBCUTANEOUS | 11 refills | Status: DC
  Filled 2023-09-12: qty 15, 31d supply, fill #0

## 2023-09-12 NOTE — Telephone Encounter (Signed)
Patient stated she got it taken care and does not need further assistance. Whitney Benton, [facility] contacted the patient and got her the insulin that she needed. Summary: med management   Pt is calling with concerns about not being able to get her insulin. Stated is having issues with her insurance. A message has been sent to [facility] Scientist, research (physical sciences)) as requested by the pt however, pt is concerned that she is out of insulin for the weekend and stated she does not want to end up in the hospital.  Seeking clinical advice.

## 2023-09-12 NOTE — Telephone Encounter (Signed)
Pt is calling to f/u regarding her concerns of not being able to get her insulin. Requested a callback from [place]. Please advise.

## 2023-09-12 NOTE — Telephone Encounter (Signed)
Copied from CRM [id number]. Topic: General - Inquiry >> Sep 12, 2023  1:49 PM Patsy Lager T wrote: Reason for CRM: patient called in stated she is having problems getting her insulin as insurance is stating it needs approval and Franky Macho said he could help her. Please have Franky Macho f/u with patient as she has an appt on Monday but have no more insulin for the weekend

## 2023-09-12 NOTE — Telephone Encounter (Signed)
Insurance prefers Humalog. Rxn for this and Lantus sent to our pharmacy. These are ready for pick-up and patient has been preferred.

## 2023-09-12 NOTE — Addendum Note (Signed)
Addended by: Yehuda Savannah L on: 09/12/2023 03:48 PM   Modules accepted: Orders

## 2023-09-13 LAB — VITAMIN D 25 HYDROXY (VIT D DEFICIENCY, FRACTURES): Vit D, 25-Hydroxy: 35.4 ng/mL (ref 30.0–100.0)

## 2023-09-13 LAB — MICROALBUMIN / CREATININE URINE RATIO

## 2023-09-14 NOTE — Progress Notes (Unsigned)
S:     No chief complaint on file.  52 y.o. female who presents for diabetes evaluation, education, and management. Patient was referred and last seen by Primary Care Provider, Dr. Laural Benes, on 09/08/23. Patient was last seen by pharmacy clinic on 08/11/23.   PMH is significant for T1DM (LADA) HTN, MDD, migraines, obesity, HLD, vit d def, non-obstructive CAD.   At last visit with pharmacy on 08/11/23, she reported multiple hypoglycemia occurrences; insulin glargine was reduced from 45 u qam & 35 u qpm to 42 & 22. Mealtime insulin was reduced from 18 to 16 units TID AC.   On 09/08/23 w/ Dr. Laural Benes, A1c was 10.5% (13% 04/2023) and she reported endocrinology increased her insulin dose back up to glargine 45 a.m/30 p.m, aspart 18 units TID AC on 09/04/23. However, endocrinology note says Novolog 30 u TID & Lantus 42 qam & 24 u qpm  Today, Patient arrives in good spirits and presents without any assistance. Patient reports ongoing BG readings >300, denies hyperglycemia symptoms. Endorses stress at home that may be contributing to high BG readings.   Patient reports Diabetes was diagnosed in 2020.   Family/Social History:  Sxh: Former Smoker. Occasional marijuana use, Occasional Drinking Fhx: Mother DM, HTN, Depression. Father: DM, HTN  Current diabetes medications include: metformin XR 500 mg BID, Lantus 45 u qam & 30 u qpm, Humalog 18 u TID AC  Current hypertension medications include: amlodipine 10 mg daily, metoprolol tartrate 50 mg BID Current hyperlipidemia medications include: atorvastatin 40 mg daily  Patient reports adherence to taking all medications as prescribed.   Insurance coverage:  medicaid  Patient reports hypoglycemic events. A few readings ~100, symptomatic  Patient denies nocturia (nighttime urination).  Patient denies neuropathy (nerve pain). Patient denies visual changes. Patient denies self foot exams.   Patient reported dietary habits:  -endorses less fried  foods & salt intake, more baked foods and vegetables -endorses stress at home, may be leading to dietary indiscretion/hyperglycemia  Patient-reported exercise habits: not discussed this visit  O:   ROS  Physical Exam  Libre3 CGM Download today 9/10-9/23 Glucose Management Indicator: 10.2%  Time in Goal:  - Time in range 70-180: 16% - Time above range: 24% 181-250, 60% >250 - Time below range: 0% Observed patterns: BG >300 overnight and consistent spikes in the morning around 6-8am   Lab Results  Component Value Date   HGBA1C 10.5 (A) 09/08/2023   There were no vitals filed for this visit.  Lipid Panel     Component Value Date/Time   CHOL 177 11/30/2021 1039   TRIG 181 (H) 11/30/2021 1039   HDL 40 11/30/2021 1039   CHOLHDL 4.4 11/30/2021 1039   CHOLHDL 3.2 11/14/2014 0707   VLDL 19 11/14/2014 0707   LDLCALC 105 (H) 11/30/2021 1039    Clinical Atherosclerotic Cardiovascular Disease (ASCVD): No  The 10-year ASCVD risk score (Arnett DK, et al., 2019) is: 11.6%   Values used to calculate the score:     Age: 25 years     Sex: Female     Is Non-Hispanic African American: Yes     Diabetic: Yes     Tobacco smoker: No     Systolic Blood Pressure: 125 mmHg     Is BP treated: Yes     HDL Cholesterol: 40 mg/dL     Total Cholesterol: 177 mg/dL   Patient is participating in a Managed Medicaid Plan:  Yes   A/P: Diabetes longstanding currently uncontrolled based  on most recent A1c 10.5% and GMI today 10.2%. Patient is able to verbalize appropriate hypoglycemia management plan. Medication adherence appears appropriate. Control is suboptimal due to insulin regimen, dietary indiscretion, and increased stress at home. Patient mentioned the nutritionist reached out to make an appointment but she had not scheduled with them yet. Also mentioned [place] end[facility]ology had reached out to make an appointment, but she is currently followed by [facility] for her  endocrinology care.  -Discontinued basal insulin Lantus (insulin glargine)  -Start Toujeo Max Solostar 300 u/mL, 66 units QAM following PA approval -Increased dose of rapid insulin Humalog (insulin lispro) from 18 to 22 units TID AC.  -Connected LibreView account with clinic -Continued metformin XR 500 mg BID.  -Patient educated on purpose, proper use, and potential adverse effects of insulin.  -Extensively discussed pathophysiology of diabetes, recommended lifestyle interventions, dietary effects on blood sugar control.  -Counseled on s/sx of and management of hypoglycemia.  -Next A1c anticipated 11/2023.   ASCVD risk - primary prevention in patient with diabetes. Last LDL is 105 (11/2021) not at goal of <100 mg/dL. ASCVD risk factors include HTN, DM, HLD, obesity, and 10-year ASCVD risk score of 11.6%. high intensity statin indicated. Patient refused lipid panel today -Continued atorvastatin 40 mg.  -Order lipid panel for PCP visit in December  Hypertension longstanding currently controlled 122/88 today. Blood pressure goal of <130/80 mmHg. Medication adherence appropriate. Blood pressure control is suboptimal b/c patient did not take BP meds yet this morning. -Continued amlodipine 10 mg. -Continue metoprolol tartrate 50 mg BID  Written patient instructions provided. Patient verbalized understanding of treatment plan.  Total time in face to face counseling 30 minutes.    Follow-up:  Pharmacist 1 month Patient seen with Rosina Lowenstein, PharmD Candidate [facility]

## 2023-09-15 ENCOUNTER — Telehealth: Payer: Self-pay | Admitting: Pharmacist

## 2023-09-15 ENCOUNTER — Telehealth: Payer: Self-pay

## 2023-09-15 ENCOUNTER — Encounter: Payer: Self-pay | Admitting: Pharmacist

## 2023-09-15 ENCOUNTER — Ambulatory Visit: Payer: Medicaid Other | Attending: Internal Medicine | Admitting: Pharmacist

## 2023-09-15 ENCOUNTER — Other Ambulatory Visit: Payer: Self-pay

## 2023-09-15 DIAGNOSIS — I10 Essential (primary) hypertension: Secondary | ICD-10-CM | POA: Diagnosis not present

## 2023-09-15 DIAGNOSIS — Z794 Long term (current) use of insulin: Secondary | ICD-10-CM

## 2023-09-15 DIAGNOSIS — Z7984 Long term (current) use of oral hypoglycemic drugs: Secondary | ICD-10-CM | POA: Diagnosis not present

## 2023-09-15 DIAGNOSIS — E1069 Type 1 diabetes mellitus with other specified complication: Secondary | ICD-10-CM | POA: Diagnosis not present

## 2023-09-15 MED ORDER — INSULIN LISPRO (1 UNIT DIAL) 100 UNIT/ML (KWIKPEN)
22.0000 [IU] | PEN_INJECTOR | Freq: Three times a day (TID) | SUBCUTANEOUS | 11 refills | Status: DC
  Filled 2023-09-15 – 2023-10-06 (×5): qty 15, 23d supply, fill #0

## 2023-09-15 MED ORDER — TOUJEO MAX SOLOSTAR 300 UNIT/ML ~~LOC~~ SOPN
66.0000 [IU] | PEN_INJECTOR | Freq: Every day | SUBCUTANEOUS | 2 refills | Status: DC
  Filled 2023-09-15: qty 9, 40d supply, fill #0
  Filled 2023-10-14 – 2023-10-16 (×2): qty 9, 40d supply, fill #1

## 2023-09-15 NOTE — Telephone Encounter (Signed)
Hey friend. Can we start a PA for this patient's Toujeo?

## 2023-09-15 NOTE — Telephone Encounter (Signed)
Pharmacy Patient Advocate Encounter   Received notification from Physician's Office that prior authorization for TOUJEO MAX is required/requested.   Insurance verification completed.   The patient is insured through [facility] MEDICAID .   Per test claim: PA required; PA submitted to [facility] MEDICAID via CoverMyMeds Key/confirmation #/EOC [facility] Status is pending

## 2023-09-16 ENCOUNTER — Other Ambulatory Visit: Payer: Self-pay

## 2023-09-16 ENCOUNTER — Telehealth: Payer: Self-pay | Admitting: Neurology

## 2023-09-16 ENCOUNTER — Telehealth: Payer: Self-pay

## 2023-09-16 NOTE — Telephone Encounter (Signed)
Patient called needing a PA for Aimavig

## 2023-09-16 NOTE — Telephone Encounter (Signed)
Pharmacy Patient Advocate Encounter  Received notification from [facility] MEDICAID that Prior Authorization for Whitney Benton has been APPROVED from 09/15/2023 to 09/14/2024   PA #/Case ID/Reference #: [id number]

## 2023-09-17 ENCOUNTER — Other Ambulatory Visit: Payer: Self-pay

## 2023-09-18 ENCOUNTER — Ambulatory Visit: Payer: Medicaid Other | Admitting: Physical Therapy

## 2023-09-18 DIAGNOSIS — M542 Cervicalgia: Secondary | ICD-10-CM

## 2023-09-18 NOTE — Therapy (Addendum)
Patient Name: Whitney Benton MRN: [id number] DOB:25-Nov-1971, 52 y.o., female Today's Date: 09/18/2023   PT End of Session - 09/18/23 1027     Visit Number 1    Authorization Type U[employer] Medicaid    PT Start Time 1017    PT Stop Time 1020    PT Time Calculation (min) 3 min    Activity Tolerance Patient limited by pain;Treatment limited secondary to medical complications (Comment)    Behavior During Therapy [facility] for tasks assessed/performed             Pt arrived for scheduled PT eval and reported feeling sick. Rescheduled evaluation for October 4 at 8:45am which pt confirmed.    Jill Alexanders Yossi Hinchman, PT, DPT 09/18/2023, 10:25 AM

## 2023-09-20 ENCOUNTER — Other Ambulatory Visit: Payer: Self-pay

## 2023-09-20 ENCOUNTER — Emergency Department (HOSPITAL_COMMUNITY): Payer: Medicaid Other

## 2023-09-20 ENCOUNTER — Emergency Department (HOSPITAL_COMMUNITY)
Admission: EM | Admit: 2023-09-20 | Discharge: 2023-09-20 | Disposition: A | Discharge status: Home / Self Care | Payer: Medicaid Other | Attending: Emergency Medicine | Admitting: Emergency Medicine

## 2023-09-20 DIAGNOSIS — R1010 Upper abdominal pain, unspecified: Secondary | ICD-10-CM | POA: Diagnosis not present

## 2023-09-20 DIAGNOSIS — E119 Type 2 diabetes mellitus without complications: Secondary | ICD-10-CM | POA: Diagnosis not present

## 2023-09-20 DIAGNOSIS — Z7984 Long term (current) use of oral hypoglycemic drugs: Secondary | ICD-10-CM | POA: Insufficient documentation

## 2023-09-20 DIAGNOSIS — J45909 Unspecified asthma, uncomplicated: Secondary | ICD-10-CM | POA: Diagnosis not present

## 2023-09-20 DIAGNOSIS — Z794 Long term (current) use of insulin: Secondary | ICD-10-CM | POA: Insufficient documentation

## 2023-09-20 DIAGNOSIS — R932 Abnormal findings on diagnostic imaging of liver and biliary tract: Secondary | ICD-10-CM | POA: Diagnosis not present

## 2023-09-20 DIAGNOSIS — E876 Hypokalemia: Secondary | ICD-10-CM | POA: Insufficient documentation

## 2023-09-20 DIAGNOSIS — R9431 Abnormal electrocardiogram [ECG] [EKG]: Secondary | ICD-10-CM | POA: Diagnosis not present

## 2023-09-20 DIAGNOSIS — R112 Nausea with vomiting, unspecified: Secondary | ICD-10-CM | POA: Insufficient documentation

## 2023-09-20 DIAGNOSIS — R1013 Epigastric pain: Secondary | ICD-10-CM | POA: Diagnosis not present

## 2023-09-20 LAB — CBC
HCT: 41.6 % (ref 36.0–46.0)
Hemoglobin: 13.1 g/dL (ref 12.0–15.0)
MCH: 26.7 pg (ref 26.0–34.0)
MCHC: 31.5 g/dL (ref 30.0–36.0)
MCV: 84.9 fL (ref 80.0–100.0)
Platelets: 427 10*3/uL — ABNORMAL HIGH (ref 150–400)
RBC: 4.9 MIL/uL (ref 3.87–5.11)
RDW: 14.6 % (ref 11.5–15.5)
WBC: 9.1 10*3/uL (ref 4.0–10.5)
nRBC: 0 % (ref 0.0–0.2)

## 2023-09-20 LAB — COMPREHENSIVE METABOLIC PANEL
ALT: 21 U/L (ref 0–44)
AST: 24 U/L (ref 15–41)
Albumin: 4 g/dL (ref 3.5–5.0)
Alkaline Phosphatase: 108 U/L (ref 38–126)
Anion gap: 11 (ref 5–15)
BUN: 9 mg/dL (ref 6–20)
CO2: 30 mmol/L (ref 22–32)
Calcium: 9.4 mg/dL (ref 8.9–10.3)
Chloride: 99 mmol/L (ref 98–111)
Creatinine, Ser: 0.82 mg/dL (ref 0.44–1.00)
GFR, Estimated: >60 mL/min (ref >60)
Glucose, Bld: 167 mg/dL — ABNORMAL HIGH (ref 70–99)
Potassium: 2.9 mmol/L — ABNORMAL LOW (ref 3.5–5.1)
Sodium: 140 mmol/L (ref 135–145)
Total Bilirubin: 0.5 mg/dL (ref 0.3–1.2)
Total Protein: 8.1 g/dL (ref 6.5–8.1)

## 2023-09-20 LAB — LIPASE, BLOOD: Lipase: 26 U/L (ref 11–51)

## 2023-09-20 LAB — CBG MONITORING, ED: Glucose-Capillary: 155 mg/dL — ABNORMAL HIGH (ref 70–99)

## 2023-09-20 MED ORDER — POTASSIUM CHLORIDE CRYS ER 20 MEQ PO TBCR
40.0000 meq | EXTENDED_RELEASE_TABLET | Freq: Once | ORAL | Status: AC
  Administered 2023-09-20: 40 meq via ORAL
  Filled 2023-09-20: qty 2

## 2023-09-20 MED ORDER — ALUM & MAG HYDROXIDE-SIMETH 200-200-20 MG/5ML PO SUSP
30.0000 mL | Freq: Once | ORAL | Status: AC
  Administered 2023-09-20: 30 mL via ORAL
  Filled 2023-09-20: qty 30

## 2023-09-20 MED ORDER — IOHEXOL 300 MG/ML  SOLN
100.0000 mL | Freq: Once | INTRAMUSCULAR | Status: AC | PRN
  Administered 2023-09-20: 100 mL via INTRAVENOUS

## 2023-09-20 MED ORDER — ONDANSETRON 4 MG PO TBDP
4.0000 mg | ORAL_TABLET | Freq: Three times a day (TID) | ORAL | 0 refills | Status: DC | PRN
Start: 2023-09-20 — End: 2023-11-12

## 2023-09-20 MED ORDER — METOCLOPRAMIDE HCL 5 MG/ML IJ SOLN
5.0000 mg | Freq: Once | INTRAMUSCULAR | Status: AC
  Administered 2023-09-20: 5 mg via INTRAVENOUS
  Filled 2023-09-20: qty 2

## 2023-09-20 MED ORDER — SODIUM CHLORIDE 0.9 % IV BOLUS
1000.0000 mL | Freq: Once | INTRAVENOUS | Status: AC
  Administered 2023-09-20: 1000 mL via INTRAVENOUS

## 2023-09-20 MED ORDER — ONDANSETRON HCL 4 MG/2ML IJ SOLN
4.0000 mg | Freq: Once | INTRAMUSCULAR | Status: AC
  Administered 2023-09-20: 4 mg via INTRAVENOUS
  Filled 2023-09-20: qty 2

## 2023-09-20 NOTE — Discharge Instructions (Addendum)
It was a pleasure taking care of you today. As discussed, your labs were reassuring. Your potassium was low. You were given potassium in the ED. Have your labs rechecked in 2-3 days. I am sending you home with nausea medication. Take as needed for nausea. Return to the ER for new or worsening symptoms.   Your CT scan showed a lesion in your liver. Please follow-up with PCP for further evaluation. You will likely need an MRI of your abdomen for further evaluation

## 2023-09-20 NOTE — ED Notes (Signed)
Pt transported to CT ?

## 2023-09-20 NOTE — ED Provider Notes (Cosign Needed Addendum)
Signout received on this 52 year old female awaiting reevaluation and p.o. trial.  See previous note for full details. Physical Exam  BP (!) 108/57   Pulse 95   Temp 98.5 F (36.9 C) (Oral)   Resp 11   Ht 5\' 5"  (1.651 m)   Wt 97.5 kg   SpO2 98%   BMI 35.78 kg/m     Procedures  Procedures  ED Course / MDM   Clinical Course as of 09/20/23 2131  Sat Sep 20, 2023  1933 Reassessed patient. Admits to persist nausea. No further vomiting. Also admits to upper abdominal pain. CT abdomen negative. Reglan and GI cocktail given. [CA]    Clinical Course User Index [CA] Mannie Stabile, PA-C   Medical Decision Making Amount and/or Complexity of Data Reviewed Labs: ordered. Radiology: ordered.  Risk OTC drugs. Prescription drug management.   Reevaluation patient reports improvement in pain.  No additional episodes of emesis.  She feels ready for discharge.  Discharged in stable condition.       Marita Kansas, PA-C 09/20/23 2132    Marita Kansas, PA-C 09/20/23 2132    Rozelle Logan, DO 09/21/23 1513

## 2023-09-20 NOTE — ED Triage Notes (Signed)
Pt arrived via POV. C/o abd pain, and N/V for 2x days. Limited PO intake.  Hx gastroparesis  AOx4

## 2023-09-20 NOTE — ED Provider Notes (Signed)
[place] EMERGENCY DEPARTMENT AT [facility] Provider Note   CSN: [id number] Arrival date & time: 09/20/23  1446     History  Chief Complaint  Patient presents with   Abdominal Pain   Nausea   Emesis    Whitney Benton is a 52 y.o. female with a past medical history significant for diabetes, hyperlipidemia, asthma, GERD, depression, and history of gastroparesis who presents to the ED due to upper abdominal pain associated with nausea and vomiting x 2 days.  Admits to numerous episodes of nonbloody, nonbilious emesis.  No diarrhea.  No fever or chills.  Denies chest pain and shortness of breath.  Patient states her glucose has been reasonably controlled over the past few days.  Believes her symptoms are related to her gastroparesis. Previous cholecystectomy. No other abdominal operations.   History obtained from patient and past medical records. No interpreter used during encounter.       Home Medications Prior to Admission medications   Medication Sig Start Date End Date Taking? Authorizing Provider  ondansetron (ZOFRAN-ODT) 4 MG disintegrating tablet Take 1 tablet (4 mg total) by mouth every 8 (eight) hours as needed. 09/20/23  Yes Mannie Stabile, PA-C  Accu-Chek Softclix Lancets lancets Use to check blood sugar 3 times daily 08/11/23   Marcine Matar, MD  albuterol (VENTOLIN HFA) 108 (90 Base) MCG/ACT inhaler Inhale 2 puffs into the lungs every 6 (six) hours as needed for wheezing or shortness of breath (Cough). 02/26/23   Marcine Matar, MD  amLODipine (NORVASC) 10 MG tablet Take 1 tablet (10 mg total) by mouth daily. 05/07/23   Anders Simmonds, PA-C  atorvastatin (LIPITOR) 40 MG tablet Take 1 tablet (40 mg total) by mouth daily. 05/07/23   Anders Simmonds, PA-C  Blood Glucose Monitoring Suppl (ACCU-CHEK GUIDE) w/Device KIT Use to check blood sugar 3 times daily. 08/11/23   Marcine Matar, MD  Continuous Glucose Sensor (FREESTYLE LIBRE 3 SENSOR) MISC Use  to check blood sugar continuously throughout the day. Replace sensors once every 14 days. Dx E11.65 05/07/23   Marcine Matar, MD  cycloSPORINE (RESTASIS) 0.05 % ophthalmic emulsion Apply 1 drop into both eyes twice a day 09/19/21     DULoxetine (CYMBALTA) 60 MG capsule Take 1 capsule (60 mg total) by mouth daily. 09/02/23   Shanna Cisco, NP  Erenumab-aooe (AIMOVIG) 140 MG/ML SOAJ Inject 140 mg into the skin every 28 (twenty-eight) days. 09/11/23   Drema Dallas, DO  gabapentin (NEURONTIN) 300 MG capsule Take 1 capsule (300 mg total) by mouth 3 (three) times daily. 09/02/23   Toy Cookey E, NP  glucose blood (ACCU-CHEK GUIDE) test strip Use to check blood sugar 3 times daily 08/11/23   Marcine Matar, MD  hydrOXYzine (ATARAX) 50 MG tablet Take 1 tablet (50 mg total) by mouth 3 (three) times daily. 09/02/23   Toy Cookey E, NP  insulin glargine, 2 Unit Dial, (TOUJEO MAX SOLOSTAR) 300 UNIT/ML Solostar Pen Inject 66 Units into the skin daily. 09/15/23   Marcine Matar, MD  insulin lispro (HUMALOG KWIKPEN) 100 UNIT/ML KwikPen Inject 22 Units into the skin 3 (three) times daily. 09/15/23   Marcine Matar, MD  Insulin Pen Needle (PEN NEEDLES 3/16") 31G X 5 MM MISC Use as directed with insulin pen 05/07/23   Georgian Co M, PA-C  LYBALVI 15-10 MG TABS Take 1 tablet by mouth daily. 09/02/23   Shanna Cisco, NP  metFORMIN [place] Roche)  500 MG 24 hr tablet Take 1 tablet (500 mg total) by mouth 2 (two) times daily. 09/08/23   Marcine Matar, MD  metoprolol tartrate (LOPRESSOR) 50 MG tablet Take 1 tablet (50 mg total) by mouth 2 (two) times daily. 05/07/23   Anders Simmonds, PA-C  metroNIDAZOLE (FLAGYL) 500 MG tablet Take 1 tablet (500 mg total) by mouth 2 (two) times daily. 08/12/23   Bernerd Limbo, CNM  Multiple Vitamins-Minerals (EYE VITAMINS PO) Take 1 tablet by mouth daily.    [provider]  omeprazole (PRILOSEC) 40 MG capsule TAKE 1 CAPSULE BY MOUTH ONCE  DAILY 30 MINUTES BEFORE BREAKFAST 03/10/23   Meredith Pel, NP  ondansetron (ZOFRAN-ODT) 4 MG disintegrating tablet Take 1 tablet (4 mg total) by mouth every 8 (eight) hours as needed for nausea or vomiting. 07/07/23   Marcine Matar, MD  SUMAtriptan (IMITREX) 100 MG tablet TAKE 1 TABLET EARLIEST ONSET OF MIGRAINE. MAY REPEAT IN 2 HOURS IF HEADACHE PERSISTS OR RECURS. MAXIMUM 2 TABLETS IN 24 HOURS. 03/11/23 03/10/24  Drema Dallas, DO  terconazole (TERAZOL 7) 0.4 % vaginal cream Place 1 applicator vaginally at bedtime. 08/12/23   Bernerd Limbo, CNM  triamcinolone cream (KENALOG) 0.1 % Apply 1 Application topically 2 (two) times daily. 08/12/22   Marcine Matar, MD  Vitamin D, Cholecalciferol, 10 MCG (400 UNIT) CAPS Take 400 Int'l Units/day by mouth daily. 09/10/23   Marcine Matar, MD  zolpidem (AMBIEN) 10 MG tablet Take 1 tablet (10 mg total) by mouth at bedtime as needed for sleep. 09/02/23   Shanna Cisco, NP  cetirizine (ZYRTEC) 10 MG tablet Take 1 tablet (10 mg total) by mouth daily. Patient not taking: No sig reported 03/31/19 03/03/20  Hoy Register, MD      Allergies    Aspirin, Trulicity [dulaglutide], and Oxycodone    Review of Systems   Review of Systems  Cardiovascular:  Negative for chest pain.  Gastrointestinal:  Positive for abdominal pain, nausea and vomiting.    Physical Exam Updated Vital Signs BP 116/78 (BP Location: Left Arm)   Pulse 84   Temp 98.8 F (37.1 C) (Oral)   Resp 12   Ht 5\' 5"  (1.651 m)   Wt 97.5 kg   SpO2 100%   BMI 35.78 kg/m  Physical Exam Vitals and nursing note reviewed.  Constitutional:      General: She is not in acute distress.    Appearance: She is not ill-appearing.  HENT:     Head: Normocephalic.  Eyes:     Pupils: Pupils are equal, round, and reactive to light.  Cardiovascular:     Rate and Rhythm: Normal rate and regular rhythm.     Pulses: Normal pulses.     Heart sounds: Normal heart sounds. No murmur heard.     No friction rub. No gallop.  Pulmonary:     Effort: Pulmonary effort is normal.     Breath sounds: Normal breath sounds.  Abdominal:     General: Abdomen is flat. There is no distension.     Palpations: Abdomen is soft.     Tenderness: There is abdominal tenderness. There is no guarding or rebound.     Comments: Upper abdominal tenderness  Musculoskeletal:        General: Normal range of motion.     Cervical back: Neck supple.  Skin:    General: Skin is warm and dry.  Neurological:     General: No focal  deficit present.     Mental Status: She is alert.  Psychiatric:        Mood and Affect: Mood normal.        Behavior: Behavior normal.     ED Results / Procedures / Treatments   Labs (all labs ordered are listed, but only abnormal results are displayed) Labs Reviewed  COMPREHENSIVE METABOLIC PANEL - Abnormal; Notable for the following components:      Result Value   Potassium 2.9 (*)    Glucose, Bld 167 (*)    All other components within normal limits  CBC - Abnormal; Notable for the following components:   Platelets 427 (*)    All other components within normal limits  CBG MONITORING, ED - Abnormal; Notable for the following components:   Glucose-Capillary 155 (*)    All other components within normal limits  LIPASE, BLOOD    EKG None  Radiology CT ABDOMEN PELVIS W CONTRAST  Result Date: 09/20/2023 CLINICAL DATA:  Epigastric pain.  Nausea vomiting. EXAM: CT ABDOMEN AND PELVIS WITH CONTRAST TECHNIQUE: Multidetector CT imaging of the abdomen and pelvis was performed using the standard protocol following bolus administration of intravenous contrast. RADIATION DOSE REDUCTION: This exam was performed according to the departmental dose-optimization program which includes automated exposure control, adjustment of the mA and/or kV according to patient size and/or use of iterative reconstruction technique. CONTRAST:  OMNIPAQUE IOHEXOL 300 MG/ML  SOLN COMPARISON:   05/23/2023 FINDINGS: Lower chest: Unremarkable. Hepatobiliary: No suspicious focal abnormality within the liver parenchyma. Stable 2.5 cm low-density lesion posterior segment IV, likely focal fatty deposition. Nonvisualization of the gallbladder suggest cholecystectomy. No intrahepatic or extrahepatic biliary dilation. Pancreas: No focal mass lesion. No dilatation of the main duct. No intraparenchymal cyst. No peripancreatic edema. Spleen: No splenomegaly. No suspicious focal mass lesion. Adrenals/Urinary Tract: No adrenal nodule or mass. Kidneys unremarkable. No evidence for hydroureter. The urinary bladder appears normal for the degree of distention. Stomach/Bowel: Stomach is unremarkable. No gastric wall thickening. No evidence of outlet obstruction. Duodenum is normally positioned as is the ligament of Treitz. No small bowel wall thickening. No small bowel dilatation. The terminal ileum is normal. The appendix is normal. No gross colonic mass. No colonic wall thickening. Vascular/Lymphatic: No abdominal aortic aneurysm. No abdominal aortic atherosclerotic calcification. There is no gastrohepatic or hepatoduodenal ligament lymphadenopathy. No retroperitoneal or mesenteric lymphadenopathy. No pelvic sidewall lymphadenopathy. Reproductive: Hysterectomy.  There is no adnexal mass. Other: No intraperitoneal free fluid. Musculoskeletal: No worrisome lytic or sclerotic osseous abnormality. IMPRESSION: No acute findings in the abdomen or pelvis. Specifically, no findings to explain the patient's history of epigastric pain. Stable subcapsular hypoattenuating lesion posterior segment IV of the liver. This is most likely focal fatty deposition, but MRI of the abdomen with and without contrast recommended to confirm. Electronically Signed   By: Kennith Center M.D.   On: 09/20/2023 19:18    Procedures Procedures    Medications Ordered in ED Medications  sodium chloride 0.9 % bolus 1,000 mL (0 mLs Intravenous Stopped  09/20/23 1855)  ondansetron (ZOFRAN) injection 4 mg (4 mg Intravenous Given 09/20/23 1622)  potassium chloride SA (KLOR-CON M) CR tablet 40 mEq (40 mEq Oral Given 09/20/23 1900)  iohexol (OMNIPAQUE) 300 MG/ML solution 100 mL (100 mLs Intravenous Contrast Given 09/20/23 1840)    ED Course/ Medical Decision Making/ A&P Clinical Course as of 09/20/23 1958  Sat Sep 20, 2023  1933 Reassessed patient. Admits to persist nausea. No further vomiting. Also admits to  upper abdominal pain. CT abdomen negative. Reglan and GI cocktail given. [CA]    Clinical Course User Index [CA] Mannie Stabile, PA-C                                 Medical Decision Making Amount and/or Complexity of Data Reviewed Labs: ordered. Decision-making details documented in ED Course. Radiology: ordered and independent interpretation performed. Decision-making details documented in ED Course.  Risk OTC drugs. Prescription drug management.   This patient presents to the ED for concern of nausea and vomiting, this involves an extensive number of treatment options, and is a complaint that carries with it a high risk of complications and morbidity.  The differential diagnosis includes gastroparesis, DKA, pancreatitis, etc  52 year old female presents to the ED due to upper abdominal pain associated with nausea and vomiting x 2 days.  Patient admits to numerous episodes of nonbloody, nonbilious emesis.  No chest pain or shortness of breath.  History of DM and gastroparesis.  Upon arrival patient afebrile, not tachycardic or hypoxic.  Patient in no acute distress.  Physical exam significant for epigastric and right upper quadrant tenderness.  Previous cholecystectomy.  Routine labs ordered.  CT abdomen.  IV Zofran and morphine given. IVFs. EKG to rule out atypical ACS.  CBC reassuring.  Thrombocytosis at 427.  Normal hemoglobin and white blood cells.  CMP significant for hypokalemia at 2.9.  Potassium repleted here in the ED.   Hyperglycemia at 167.  No anion gap.  Low suspicion for DKA.  Normal renal function.  Lipase normal.  Low suspicion for pancreatitis.  CT abdomen personally reviewed and interpreted which is negative for any acute abnormalities.  No evidence of pancreatitis.  Stable subcapsular hypoattenuating lesion of the liver likely a focal fatty deposition. Informed patient of these results and need to have an MRI in the outpatient setting.  Patient handed off to [employer], PA-C at shift change pending reassessment. If pain has improved and no further emesis, patient may be discharged home.  Lives at home Has PCP Hx DM       Final Clinical Impression(s) / ED Diagnoses Final diagnoses:  Pain of upper abdomen  Nausea and vomiting, unspecified vomiting type  Hypokalemia    Rx / DC Orders ED Discharge Orders          Ordered    ondansetron (ZOFRAN-ODT) 4 MG disintegrating tablet  Every 8 hours PRN        09/20/23 1907              Jesusita Oka 09/20/23 Karena Addison, MD 09/20/23 2308

## 2023-09-22 ENCOUNTER — Other Ambulatory Visit (HOSPITAL_COMMUNITY): Payer: Self-pay

## 2023-09-22 ENCOUNTER — Telehealth: Payer: Self-pay

## 2023-09-22 NOTE — Telephone Encounter (Signed)
Pharmacy Patient Advocate Encounter   Received notification from Pt Calls Messages that prior authorization for Aimovig 140MG /ML auto-injectors is required/requested.   Insurance verification completed.   The patient is insured through [facility] .   Per test claim: PA required; PA submitted to [facility] via CoverMyMeds Key/confirmation #/EOC [id number] Status is pending

## 2023-09-24 ENCOUNTER — Ambulatory Visit
Admission: RE | Admit: 2023-09-24 | Discharge: 2023-09-24 | Disposition: A | Discharge status: Home / Self Care | Payer: Medicaid Other | Source: Ambulatory Visit | Attending: Internal Medicine | Admitting: Internal Medicine

## 2023-09-24 DIAGNOSIS — Z1231 Encounter for screening mammogram for malignant neoplasm of breast: Secondary | ICD-10-CM | POA: Diagnosis not present

## 2023-09-26 ENCOUNTER — Other Ambulatory Visit: Payer: Self-pay

## 2023-09-26 ENCOUNTER — Ambulatory Visit: Payer: Medicaid Other | Attending: Internal Medicine

## 2023-09-26 DIAGNOSIS — M542 Cervicalgia: Secondary | ICD-10-CM | POA: Diagnosis present

## 2023-09-26 DIAGNOSIS — M6281 Muscle weakness (generalized): Secondary | ICD-10-CM | POA: Diagnosis present

## 2023-09-26 NOTE — Therapy (Signed)
OUTPATIENT PHYSICAL THERAPY CERVICAL EVALUATION   Patient Name: Whitney Benton MRN: [id number] DOB:1971-05-09, 52 y.o., female Today's Date: 09/26/2023  END OF SESSION:  PT End of Session - 09/26/23 0855     Visit Number 1    Number of Visits 5    Date for PT Re-Evaluation 10/24/23    Authorization Type [employer] Medicaid    PT Start Time [phone]    PT Stop Time 0928    PT Time Calculation (min) 37 min    Activity Tolerance Patient tolerated treatment well    Behavior During Therapy [facility] for tasks assessed/performed             Past Medical History:  Diagnosis Date   Allergy    Anxiety    Arthritis    Asthma    Depression    Diabetes mellitus without complication (HCC)    GERD (gastroesophageal reflux disease)    Glaucoma suspect    Hyperlipidemia    Trichomonas infection    Past Surgical History:  Procedure Laterality Date   CESAREAN SECTION     UPPER GASTROINTESTINAL ENDOSCOPY     VAGINAL HYSTERECTOMY  12/23/2004   Fibroids, menorrhagia, benign pathology   Patient Active Problem List   Diagnosis Date Noted   DKA, type 1 (HCC) 06/20/2023   Essential hypertension 06/20/2023   Vitamin D deficiency 06/20/2023   Hyperlipidemia 03/06/2023   Primary insomnia 12/27/2022   Gallstone pancreatitis 12/03/2022   Chronic cholecystitis with calculus 12/03/2022   Morbid obesity (HCC) 04/04/2022   Former smoker 04/30/2021   Major depressive disorder, recurrent episode, moderate (HCC) 04/30/2021   Substance induced mood disorder (HCC) 03/22/2021   Generalized anxiety disorder 03/22/2021   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   DKA (diabetic ketoacidosis) (HCC) 01/23/2021   Glaucoma suspect 04/12/2020   DKA, type 2, not at goal [facility]) 10/02/2019   AKI (acute kidney injury) (HCC) 10/02/2019   Hyperkalemia 10/02/2019   Leukocytosis 10/02/2019   Abnormal LFTs 10/02/2019   Stressful life events affecting family and household 08/06/2019   New onset type 2 diabetes  mellitus (HCC) 02/04/2019   Morbid obesity with BMI of 40.0-44.9, adult (HCC) 02/04/2019   Tobacco dependence 02/04/2019   Left arm weakness 12/06/2014   Paresthesias/numbness 12/06/2014   Tobacco abuse 11/14/2014   Depression    Mixed incontinence 06/27/2014   History of TVH in 2006 for fibroids and menorrhagia; benign pathology 09/08/2011    PCP: Jonah Blue, MD  REFERRING PROVIDER: Drema Dallas, DO  REFERRING DIAG: [phone] (ICD-10-CM) - Bilateral occipital neuralgia M54.2 (ICD-10-CM) - Neck pain  THERAPY DIAG:  Cervicalgia - Plan: PT plan of care cert/re-cert  Muscle weakness (generalized) - Plan: PT plan of care cert/re-cert  Rationale for Evaluation and Treatment: Rehabilitation  ONSET DATE: 09/11/2023 referral  SUBJECTIVE:  SUBJECTIVE STATEMENT: "I'm here for my migraines." Reports her migraines start at the back of her head and move toward the front. They "come out of nowhere." Unaware of any triggers. (Provided with headache log). These migraines started years ago. Does not seem to coincide with menopause. Photosensitivity, sound sensitivity, no N/V. Does take a preventative medication (AIMOVIG)- has an abortive medication (IMITREX), but states it doesn't help.  Hand dominance: Right  PERTINENT HISTORY:  Allergies, anxiety, arthritis, asthma, depression, DM, GERD, HLD  PAIN:  Are you having pain? Yes: NPRS scale: 5/10 Pain location: suboccipital region Pain description: dull  PRECAUTIONS: None  WEIGHT BEARING RESTRICTIONS: No  FALLS:  Has patient fallen in last 6 months? No  LIVING ENVIRONMENT: Lives with: lives with their son Lives in: House/apartment Stairs: Yes: External: 20 steps; can reach both Has following equipment at home: None  OCCUPATION: not  working  PLOF: Independent  PATIENT GOALS: "eliminate some of this migraine"   OBJECTIVE:  Note: Objective measures were completed at Evaluation unless otherwise noted.  DIAGNOSTIC FINDINGS:  Non contributory   PATIENT SURVEYS:  NDI 56% (28/50) Headache disability index: 74     COGNITION: Overall cognitive status: Within functional limits for tasks assessed  SENSATION: WFL  POSTURE: rounded shoulders, forward head, posterior pelvic tilt, and flexed trunk   PALPATION: Slight increase in suboccipital tension   CERVICAL ROM:  Slightly guarded  TODAY'S TREATMENT:                                                                                                                              N/a eval  PATIENT EDUCATION:  Education details: PT POC, exam findings Person educated: Patient Education method: Explanation Education comprehension: verbalized understanding and needs further education  HOME EXERCISE PROGRAM: To be provided  ASSESSMENT:  CLINICAL IMPRESSION: Patient is a 52 y.o. female who was seen today for physical therapy evaluation and treatment for headaches. Patient unable to identify triggers and reports that preventative rx is only mildly effective. States that rescues rx is not effective at all. Posture likely contributing mildly to HA, but not sole cause. NDI score indicative of 56% indicates a level of great disability related to her neck pain.  HDI score of 74 indicative of severe handicap related to her HA. She would benefit from skilled PT services to address the above mentioned deficits.  OBJECTIVE IMPAIRMENTS: decreased knowledge of condition, improper body mechanics, postural dysfunction, and pain.   ACTIVITY LIMITATIONS: carrying, lifting, locomotion level, and caring for others  PARTICIPATION LIMITATIONS: interpersonal relationship, driving, shopping, and community activity  PERSONAL FACTORS: Age, Fitness, Sex, Social background, Time since onset  of injury/illness/exacerbation, and 3+ comorbidities: see above  are also affecting patient's functional outcome.   REHAB POTENTIAL: Fair etiology  CLINICAL DECISION MAKING: Stable/uncomplicated  EVALUATION COMPLEXITY: Low   GOALS: Goals reviewed with patient? Yes  SHORT TERM GOALS: = LTG based on PT POC length   LONG TERM GOALS: Target  date: 10/24/23  Pt will be independent with final HEP for improved symptoms  Baseline: to be provided Goal status: INITIAL  2.  Patient will score </= 22/50 on the NDI to indicate a reduced level of disability due to neck pain Baseline: 28/50 Goal status: INITIAL  3.  Patient will score </= 45 on the HDI to indicate a reduced level of disability related to her headaches. Baseline:  Goal status: INITIAL  PLAN:  PT FREQUENCY: 1x/week  PT DURATION: 4 weeks  PLANNED INTERVENTIONS: Therapeutic exercises, Therapeutic activity, Neuromuscular re-education, Balance training, Gait training, Patient/Family education, Self Care, Joint mobilization, Stair training, Vestibular training, Canalith repositioning, Visual/preceptual remediation/compensation, DME instructions, Aquatic Therapy, Dry Needling, Spinal mobilization, Cryotherapy, Moist heat, Manual therapy, and Re-evaluation  PLAN FOR NEXT SESSION: DN, HEP for posture   Westley Foots, PT Westley Foots, PT, DPT, CBIS  09/26/2023, 9:40 AM

## 2023-09-29 ENCOUNTER — Other Ambulatory Visit: Payer: Self-pay

## 2023-09-30 ENCOUNTER — Other Ambulatory Visit: Payer: Self-pay

## 2023-09-30 ENCOUNTER — Encounter: Payer: Self-pay | Admitting: Internal Medicine

## 2023-10-01 NOTE — Telephone Encounter (Signed)
Pharmacy Patient Advocate Encounter  Received notification from [facility] that Prior Authorization for Aimovig 140MG /ML auto-injectors has been APPROVED from 09-22-2023 to 12-22-2023   PA #/Case ID/Reference #: [id number]

## 2023-10-01 NOTE — Telephone Encounter (Signed)
Patient advised of approval.  

## 2023-10-02 ENCOUNTER — Other Ambulatory Visit: Payer: Self-pay

## 2023-10-03 ENCOUNTER — Other Ambulatory Visit: Payer: Self-pay

## 2023-10-05 ENCOUNTER — Ambulatory Visit
Admission: RE | Admit: 2023-10-05 | Discharge: 2023-10-05 | Disposition: A | Discharge status: Home / Self Care | Payer: Medicaid Other | Source: Ambulatory Visit | Attending: Internal Medicine | Admitting: Internal Medicine

## 2023-10-05 DIAGNOSIS — K769 Liver disease, unspecified: Secondary | ICD-10-CM

## 2023-10-05 MED ORDER — GADOPICLENOL 0.5 MMOL/ML IV SOLN
10.0000 mL | Freq: Once | INTRAVENOUS | Status: AC | PRN
  Administered 2023-10-05: 10 mL via INTRAVENOUS

## 2023-10-06 ENCOUNTER — Other Ambulatory Visit: Payer: Self-pay

## 2023-10-07 ENCOUNTER — Other Ambulatory Visit: Payer: Self-pay

## 2023-10-08 ENCOUNTER — Other Ambulatory Visit: Payer: Self-pay

## 2023-10-08 ENCOUNTER — Ambulatory Visit: Payer: Medicaid Other | Admitting: Physical Therapy

## 2023-10-08 ENCOUNTER — Ambulatory Visit (INDEPENDENT_AMBULATORY_CARE_PROVIDER_SITE_OTHER): Payer: Medicaid Other | Admitting: Clinical

## 2023-10-08 ENCOUNTER — Encounter: Payer: Self-pay | Admitting: Internal Medicine

## 2023-10-08 DIAGNOSIS — F331 Major depressive disorder, recurrent, moderate: Secondary | ICD-10-CM | POA: Diagnosis not present

## 2023-10-08 DIAGNOSIS — M542 Cervicalgia: Secondary | ICD-10-CM

## 2023-10-08 DIAGNOSIS — M6281 Muscle weakness (generalized): Secondary | ICD-10-CM

## 2023-10-08 NOTE — Progress Notes (Signed)
THERAPIST PROGRESS NOTE Virtual Visit via Video Note  I connected with Whitney Benton on 10/08/23 at 11:00 AM EDT by a video enabled telemedicine application and verified that I am speaking with the correct person using two identifiers.  Location: Patient: home Provider: office   I discussed the limitations of evaluation and management by telemedicine and the availability of in person appointments. The patient expressed understanding and agreed to proceed.   Follow Up Instructions: I discussed the assessment and treatment plan with the patient. The patient was provided an opportunity to ask questions and all were answered. The patient agreed with the plan and demonstrated an understanding of the instructions.   The patient was advised to call back or seek an in-person evaluation if the symptoms worsen or if the condition fails to improve as anticipated.   Session Time: 20 minutes  Participation Level: Active  Behavioral Response: CasualAlertEuthymic  Type of Therapy: Individual Therapy  Treatment Goals addressed: Whitney Benton WILL PARTICIPATE IN AT LEAST 80% OF SCHEDULED INDIVIDUAL PSYCHOTHERAPY SESSIONS   ProgressTowards Goals: Progressing  Interventions: CBT and Supportive  Summary:  Whitney Benton is a 52 y.o. female who presents for the scheduled appointment oriented times five, appropriately dressed and friendly. Client denied hallucinations and delusions. Client reported she is doing fairly well. Client reported she is happy that her sisters boyfriend is no longer staying in her home. Client reported he was threatening her sisters children. Client reported her home is somewhat clean now. Client reported she still staying at her sons home. Client reported she has been managing her blood sugar pretty well but is staying because she is not ready to go back home and be stressed about getting her house in order.client reported she did go to the hospital in September due to issues with  gastroparesis. Client reported today is the anniversary of her fathers passing. Client reported she does not like to think about it. Client reported she misses him and her mother. Client reported she will go by their gravesite soon. Client reported her grandchildren are doing well. Evidence of progress towards goal:  client reported she is medication compliant 7 days per week.   Suicidal/Homicidal: Nowithout intent/plan  Therapist Response:  Therapist began the appointment asking the client how she has been doing. Therapist used cbt to engage using active listening and positive emotional support. Therapist used cbt to engage and ask the client open ended questions about how she has been coping with previously noted psychosocial stressors of family, health and grief. Therapist used cbt to engage and ask the client how she is coping with the death anniversary of her parents. Therapist used cbt to discuss and teach the client about grief coping skills. Therapist used CBT ask the client to identify her progress with frequency of use with coping skills with continued practice in her daily activity.    Therapist assigned homework to practice self care and grief coping skills.  Plan: Return again in 4 weeks.  Diagnosis: major depressive disorder, recurrent episode, moderate  Collaboration of Care: Patient refused AEB none requested by the client.  Patient/Guardian was advised Release of Information must be obtained prior to any record release in order to collaborate their care with an outside provider. Patient/Guardian was advised if they have not already done so to contact the registration department to sign all necessary forms in order for [place] to release information regarding their care.   Consent: Patient/Guardian gives verbal consent for treatment and assignment of benefits for  services provided during this visit. Patient/Guardian expressed understanding and agreed to proceed.   Whitney Rhymes Tela Kotecki,  LCSW 10/08/2023

## 2023-10-08 NOTE — Progress Notes (Signed)
I received documentation from [place] [employer] that patient was approved for MRI of the abdomen without dye followed by an MRI with dye, service code 16109.  Approval date 10/8 - 11/14/2023.  Auth#: [id number].

## 2023-10-08 NOTE — Therapy (Signed)
OUTPATIENT PHYSICAL THERAPY CERVICAL TREATMENT   Patient Name: Whitney Benton MRN: [id number] DOB:04/18/1971, 52 y.o., female Today's Date: 10/08/2023  END OF SESSION:  PT End of Session - 10/08/23 0924     Visit Number 2    Number of Visits 5    Date for PT Re-Evaluation 10/24/23    Authorization Type [employer] Medicaid    PT Start Time [phone]    PT Stop Time 1007    PT Time Calculation (min) 44 min    Activity Tolerance Patient tolerated treatment well    Behavior During Therapy WFL for tasks assessed/performed              Past Medical History:  Diagnosis Date   Allergy    Anxiety    Arthritis    Asthma    Depression    Diabetes mellitus without complication (HCC)    GERD (gastroesophageal reflux disease)    Glaucoma suspect    Hyperlipidemia    Trichomonas infection    Past Surgical History:  Procedure Laterality Date   CESAREAN SECTION     UPPER GASTROINTESTINAL ENDOSCOPY     VAGINAL HYSTERECTOMY  12/23/2004   Fibroids, menorrhagia, benign pathology   Patient Active Problem List   Diagnosis Date Noted   DKA, type 1 (HCC) 06/20/2023   Essential hypertension 06/20/2023   Vitamin D deficiency 06/20/2023   Hyperlipidemia 03/06/2023   Primary insomnia 12/27/2022   Gallstone pancreatitis 12/03/2022   Chronic cholecystitis with calculus 12/03/2022   Morbid obesity (HCC) 04/04/2022   Former smoker 04/30/2021   Major depressive disorder, recurrent episode, moderate (HCC) 04/30/2021   Substance induced mood disorder (HCC) 03/22/2021   Generalized anxiety disorder 03/22/2021   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   DKA (diabetic ketoacidosis) (HCC) 01/23/2021   Glaucoma suspect 04/12/2020   DKA, type 2, not at goal [facility]) 10/02/2019   AKI (acute kidney injury) (HCC) 10/02/2019   Hyperkalemia 10/02/2019   Leukocytosis 10/02/2019   Abnormal LFTs 10/02/2019   Stressful life events affecting family and household 08/06/2019   New onset type 2 diabetes  mellitus (HCC) 02/04/2019   Morbid obesity with BMI of 40.0-44.9, adult (HCC) 02/04/2019   Tobacco dependence 02/04/2019   Left arm weakness 12/06/2014   Paresthesias/numbness 12/06/2014   Tobacco abuse 11/14/2014   Depression    Mixed incontinence 06/27/2014   History of TVH in 2006 for fibroids and menorrhagia; benign pathology 09/08/2011    PCP: Jonah Blue, MD  REFERRING PROVIDER: Drema Dallas, DO  REFERRING DIAG: [phone] (ICD-10-CM) - Bilateral occipital neuralgia M54.2 (ICD-10-CM) - Neck pain  THERAPY DIAG:  Cervicalgia  Muscle weakness (generalized)  Rationale for Evaluation and Treatment: Rehabilitation  ONSET DATE: 09/11/2023 referral  SUBJECTIVE:  SUBJECTIVE STATEMENT: Pt reports she is doing better today, took her 28 day shot for migraines, those typically help with symptoms. Pt reports that her last migraine was during her HEP eval and it was just a slight one, this was before she had her shot. Pt indicates that she "sorta" has ongoing pain in the back part of her neck, that pain is also improved since she has not been having migraines.  Hand dominance: Right  PERTINENT HISTORY:  Allergies, anxiety, arthritis, asthma, depression, DM, GERD, HLD  PAIN:  Are you having pain? Yes: NPRS scale: not rated/10 Pain location: suboccipital region Pain description: dull  PRECAUTIONS: None  WEIGHT BEARING RESTRICTIONS: No  FALLS:  Has patient fallen in last 6 months? No  LIVING ENVIRONMENT: Lives with: lives with their son Lives in: House/apartment Stairs: Yes: External: 20 steps; can reach both Has following equipment at home: None  OCCUPATION: not working  PLOF: Independent  PATIENT GOALS: "eliminate some of this migraine"   OBJECTIVE:  Note: Objective  measures were completed at Evaluation unless otherwise noted.  DIAGNOSTIC FINDINGS:  Non contributory   PATIENT SURVEYS:  NDI 56% (28/50) Headache disability index: 74     COGNITION: Overall cognitive status: Within functional limits for tasks assessed  SENSATION: WFL  POSTURE: rounded shoulders, forward head, posterior pelvic tilt, and flexed trunk   PALPATION: Slight increase in suboccipital tension   CERVICAL ROM:  Slightly guarded  TODAY'S TREATMENT:                                                                                                                              Manual Therapy Suboccipital release 3 x 30 sec each Tenderness to deep pressure, more tolerant of light pressure Demonstrated how to perform this with tennis balls or with Chirp Wheel XR Added suboccipital release with tennis balls to HEP, provided handout for where to purchase Chirp Wheel Lateral cervical SB for UT stretch 3 x 30 sec each B  TherEx Supine thoracic mobilization over towel roll With shoulder abduction x 10 reps With shoulder flexion x 10 reps  Seated UT stretch 3 x 30 sec with head tilted to R side, pain with head tilt to the L Seated levator scap stretch 3 x 30 sec each to L side only  Added to HEP, see bolded below  TherAct Educated patient on TPDN (see below), she is agreeable to try next session with this therapist  Trigger Point Dry Needling  What is Trigger Point Dry Needling (DN)? DN is a physical therapy technique used to treat muscle pain and dysfunction. Specifically, DN helps deactivate muscle trigger points (muscle knots).  A thin filiform needle is used to penetrate the skin and stimulate the underlying trigger point. The goal is for a local twitch response (LTR) to occur and for the trigger point to relax. No medication of any kind is injected during the procedure.   What Does Trigger Point Dry Needling Feel  Like?  The procedure feels different for each individual  patient. Some patients report that they do not actually feel the needle enter the skin and overall the process is not painful. Very mild bleeding may occur. However, many patients feel a deep cramping in the muscle in which the needle was inserted. This is the local twitch response.   How Will I feel after the treatment? Soreness is normal, and the onset of soreness may not occur for a few hours. Typically this soreness does not last longer than two days.  Bruising is uncommon, however; ice can be used to decrease any possible bruising.  In rare cases feeling tired or nauseous after the treatment is normal. In addition, your symptoms may get worse before they get better, this period will typically not last longer than 24 hours.   What Can I do After My Treatment? Increase your hydration by drinking more water for the next 24 hours. You may place ice or heat on the areas treated that have become sore, however, do not use heat on inflamed or bruised areas. Heat often brings more relief post needling. You can continue your regular activities, but vigorous activity is not recommended initially after the treatment for 24 hours. DN is best combined with other physical therapy such as strengthening, stretching, and other therapies.    PATIENT EDUCATION:  Education details: TPDN (handout provided, see above), initiated HEP Person educated: Patient Education method: Explanation, Demonstration, and Handouts Education comprehension: verbalized understanding, returned demonstration, and needs further education  HOME EXERCISE PROGRAM: Access Code: [email or web address] URL: [email or web address] Date: 10/08/2023 Prepared by: Peter Congo  Exercises - Supine Suboccipital Release with Tennis Balls  - 1 x daily - 7 x weekly - 1 sets - 1 reps - 10 min hold - Supine Thoracic Mobilization Towel Roll Vertical  - 1 x daily - 7 x weekly - 3 sets - 10 reps - Seated Upper Trapezius Stretch  - 1 x daily - 7 x  weekly - 1 sets - 3-5 reps - 30 sec hold - Gentle Levator Scapulae Stretch  - 1 x daily - 7 x weekly - 1 sets - 3-5 reps - 30 sec hold  ASSESSMENT:  CLINICAL IMPRESSION: Emphasis of skilled PT session on educating patient on TPDN as well as performing manual therapy and initiating HEP to address neck and shoulder pain and tightness. Pt hesitant about trying DN this session due to fear of needles, provided education and she is willing to try it next session with this therapist. Performed manual therapy to upper trap and suboccipital region, pt with tenderness in her suboccipitals and only able to tolerate light pressure. Pt with better tolerance to use of tennis balls and use of Chirp Wheel for suboccipital release. Pt with poor tolerance to L lateral cervical sidebend so avoided exercises in this position. Pt continues to benefit from skilled therapy services to work on increasing cervical and shoulder ROM, improving postural stability, and working on increased independence with management of pain symptoms. Continue POC.   OBJECTIVE IMPAIRMENTS: decreased knowledge of condition, improper body mechanics, postural dysfunction, and pain.   ACTIVITY LIMITATIONS: carrying, lifting, locomotion level, and caring for others  PARTICIPATION LIMITATIONS: interpersonal relationship, driving, shopping, and community activity  PERSONAL FACTORS: Age, Fitness, Sex, Social background, Time since onset of injury/illness/exacerbation, and 3+ comorbidities: see above  are also affecting patient's functional outcome.   REHAB POTENTIAL: Fair etiology  CLINICAL DECISION MAKING: Stable/uncomplicated  EVALUATION COMPLEXITY: Low   GOALS: Goals reviewed  with patient? Yes  SHORT TERM GOALS: = LTG based on PT POC length   LONG TERM GOALS: Target date: 10/24/23  Pt will be independent with final HEP for improved symptoms  Baseline: to be provided Goal status: INITIAL  2.  Patient will score </= 22/50 on the NDI  to indicate a reduced level of disability due to neck pain Baseline: 28/50 Goal status: INITIAL  3.  Patient will score </= 45 on the HDI to indicate a reduced level of disability related to her headaches. Baseline:  Goal status: INITIAL  PLAN:  PT FREQUENCY: 1x/week  PT DURATION: 4 weeks  PLANNED INTERVENTIONS: Therapeutic exercises, Therapeutic activity, Neuromuscular re-education, Balance training, Gait training, Patient/Family education, Self Care, Joint mobilization, Stair training, Vestibular training, Canalith repositioning, Visual/preceptual remediation/compensation, DME instructions, Aquatic Therapy, Dry Needling, Spinal mobilization, Cryotherapy, Moist heat, Manual therapy, and Re-evaluation  PLAN FOR NEXT SESSION: DN next session with TT, HEP for posture as well as UT and cervical ROM (more pain/tightness on L side), manual therapy   Peter Congo, PT Peter Congo, PT, DPT, CSRS   10/08/2023, 10:07 AM

## 2023-10-14 ENCOUNTER — Other Ambulatory Visit: Payer: Self-pay

## 2023-10-15 ENCOUNTER — Ambulatory Visit: Payer: Medicaid Other

## 2023-10-15 ENCOUNTER — Telehealth: Payer: Self-pay

## 2023-10-15 NOTE — Telephone Encounter (Signed)
Pt given MRI results per notes of Dr Laural Benes on 10/15/23. Pt verbalized understanding.

## 2023-10-17 ENCOUNTER — Encounter: Payer: Self-pay | Admitting: Pharmacist

## 2023-10-17 ENCOUNTER — Other Ambulatory Visit: Payer: Self-pay

## 2023-10-17 ENCOUNTER — Ambulatory Visit: Payer: Medicaid Other | Attending: Nurse Practitioner | Admitting: Pharmacist

## 2023-10-17 DIAGNOSIS — Z794 Long term (current) use of insulin: Secondary | ICD-10-CM | POA: Diagnosis not present

## 2023-10-17 DIAGNOSIS — E1169 Type 2 diabetes mellitus with other specified complication: Secondary | ICD-10-CM

## 2023-10-17 DIAGNOSIS — Z7984 Long term (current) use of oral hypoglycemic drugs: Secondary | ICD-10-CM

## 2023-10-17 MED ORDER — INSULIN LISPRO (1 UNIT DIAL) 100 UNIT/ML (KWIKPEN)
24.0000 [IU] | PEN_INJECTOR | Freq: Three times a day (TID) | SUBCUTANEOUS | 11 refills | Status: DC
  Filled 2023-10-17 – 2023-10-29 (×2): qty 15, 21d supply, fill #0
  Filled 2023-11-21: qty 15, 21d supply, fill #1

## 2023-10-17 MED ORDER — TOUJEO MAX SOLOSTAR 300 UNIT/ML ~~LOC~~ SOPN
72.0000 [IU] | PEN_INJECTOR | Freq: Every day | SUBCUTANEOUS | 2 refills | Status: DC
  Filled 2023-10-17: qty 9, 37d supply, fill #0
  Filled 2023-11-21: qty 9, 37d supply, fill #1

## 2023-10-17 NOTE — Progress Notes (Signed)
S:     No chief complaint on file.  52 y.o. female who presents for diabetes evaluation, education, and management. Patient was referred and last seen by Primary Care Provider, Dr. Laural Benes, on 09/08/23. Patient was last seen by pharmacy clinic on 09/15/2023.   PMH is significant for T1DM (LADA) HTN, MDD, migraines, obesity, HLD, vit d def, non-obstructive CAD. At last visit with pharmacy on 09/15/2023, we changed her to [facility] 66u daily in the morning. We also increased her Novolog to 22u TID before meals.  Today, patient arrives in good spirits and presents without any assistance. Since last visit, her Josephine Igo report shows improved blood sugar control. Although she is still above goal, she has better percentages in range and adheres to her insulin regimen better. She denies hyperglycemia or hypoglycemia symptoms.   Family/Social History:  Sxh: Former Smoker. Occasional marijuana use, Occasional Drinking Fhx: Mother DM, HTN, Depression. Father: DM, HTN  Current diabetes medications include: metformin XR 500 mg BID, Toujeo 66u daily, Humalog 22 u TID AC  Current hypertension medications include: amlodipine 10 mg daily, metoprolol tartrate 50 mg BID Current hyperlipidemia medications include: atorvastatin 40 mg daily Patient reports adherence to taking all medications as prescribed.   Insurance [place]: [place]  Patient denies hypoglycemic events.  Patient denies nocturia (nighttime urination).  Patient denies neuropathy (nerve pain). Patient denies visual changes. Patient denies self foot exams.   Patient reported dietary habits:  -endorses less fried foods & salt intake, more baked foods and vegetables -endorses stress at home, may be leading to dietary indiscretion/hyperglycemia  Patient-reported exercise habits: not discussed this visit  O:   ROS  Physical Exam  Libre3 CGM Download today 09/20/2023 - 10/17/23 Glucose Management Indicator: 8.3% (down from 10.2% prior)   Time in Goal:  - Time in range 70-180: 41% (up from 16% at last visit) - Time above range: 58% (down from 84% last visit)  - Time below range: 0%   Lab Results  Component Value Date   HGBA1C 10.5 (A) 09/08/2023   There were no vitals filed for this visit.  Lipid Panel     Component Value Date/Time   CHOL 177 11/30/2021 1039   TRIG 181 (H) 11/30/2021 1039   HDL 40 11/30/2021 1039   CHOLHDL 4.4 11/30/2021 1039   CHOLHDL 3.2 11/14/2014 0707   VLDL 19 11/14/2014 0707   LDLCALC 105 (H) 11/30/2021 1039    Clinical Atherosclerotic Cardiovascular Disease (ASCVD): No  The 10-year ASCVD risk score (Arnett DK, et al., 2019) is: 10.7%   Values used to calculate the score:     Age: 42 years     Sex: Female     Is Non-Hispanic African American: Yes     Diabetic: Yes     Tobacco smoker: No     Systolic Blood Pressure: 122 mmHg     Is BP treated: Yes     HDL Cholesterol: 40 mg/dL     Total Cholesterol: 177 mg/dL   Patient is participating in a Managed Medicaid Plan:  Yes   A/P: Diabetes longstanding currently uncontrolled based on most recent A1c, however, CGM shows improvement since starting Toujeo. Patient is able to verbalize appropriate hypoglycemia management plan. Medication adherence appears appropriate.  -Increased dose of Toujeo Max Solostar 300 u/mL, to 72 units QAM. -Increased dose of rapid insulin Humalog (insulin lispro) from 22 to 24 units TID AC.  -Continued metformin XR 500 mg BID.  -Patient educated on purpose, proper use, and  potential adverse effects of insulin.  -Extensively discussed pathophysiology of diabetes, recommended lifestyle interventions, dietary effects on blood sugar control.  -Counseled on s/sx of and management of hypoglycemia.  -Next A1c anticipated 11/2023.   Written patient instructions provided. Patient verbalized understanding of treatment plan.  Total time in face to face counseling 30 minutes.    Follow-up:  Pharmacist 1 month  Butch Penny, PharmD, [place], CPP Clinical Pharmacist [facility] [phone]

## 2023-10-22 ENCOUNTER — Ambulatory Visit: Payer: Medicaid Other | Admitting: Physical Therapy

## 2023-10-22 DIAGNOSIS — M542 Cervicalgia: Secondary | ICD-10-CM | POA: Diagnosis not present

## 2023-10-22 DIAGNOSIS — M6281 Muscle weakness (generalized): Secondary | ICD-10-CM

## 2023-10-22 NOTE — Therapy (Signed)
OUTPATIENT PHYSICAL THERAPY CERVICAL TREATMENT   Patient Name: Whitney Benton MRN: [id number] DOB:[birth year], 52 y.o., female Today's Date: 10/22/2023  END OF SESSION:  PT End of Session - 10/22/23 0848     Visit Number 3    Number of Visits 5    Date for PT Re-Evaluation 10/24/23    Authorization Type [employer] Medicaid    PT Start Time 0847    PT Stop Time 0928    PT Time Calculation (min) 41 min    Activity Tolerance Patient tolerated treatment well    Behavior During Therapy [facility] for tasks assessed/performed               Past Medical History:  Diagnosis Date   Allergy    Anxiety    Arthritis    Asthma    Depression    Diabetes mellitus without complication (HCC)    GERD (gastroesophageal reflux disease)    Glaucoma suspect    Hyperlipidemia    Trichomonas infection    Past Surgical History:  Procedure Laterality Date   CESAREAN SECTION     UPPER GASTROINTESTINAL ENDOSCOPY     VAGINAL HYSTERECTOMY  12/23/2004   Fibroids, menorrhagia, benign pathology   Patient Active Problem List   Diagnosis Date Noted   DKA, type 1 (HCC) 06/20/2023   Essential hypertension 06/20/2023   Vitamin D deficiency 06/20/2023   Hyperlipidemia 03/06/2023   Primary insomnia 12/27/2022   Gallstone pancreatitis 12/03/2022   Chronic cholecystitis with calculus 12/03/2022   Morbid obesity (HCC) 04/04/2022   Former smoker 04/30/2021   Major depressive disorder, recurrent episode, moderate (HCC) 04/30/2021   Substance induced mood disorder (HCC) 03/22/2021   Generalized anxiety disorder 03/22/2021   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   DKA (diabetic ketoacidosis) (HCC) 01/23/2021   Glaucoma suspect 04/12/2020   DKA, type 2, not at goal [facility]) 10/02/2019   AKI (acute kidney injury) (HCC) 10/02/2019   Hyperkalemia 10/02/2019   Leukocytosis 10/02/2019   Abnormal LFTs 10/02/2019   Stressful life events affecting family and household 08/06/2019   New onset type 2  diabetes mellitus (HCC) 02/04/2019   Morbid obesity with BMI of 40.0-44.9, adult (HCC) 02/04/2019   Tobacco dependence 02/04/2019   Left arm weakness 12/06/2014   Paresthesias/numbness 12/06/2014   Tobacco abuse 11/14/2014   Depression    Mixed incontinence 06/27/2014   History of TVH in 2006 for fibroids and menorrhagia; benign pathology 09/08/2011    PCP: Jonah Blue, MD  REFERRING PROVIDER: Drema Dallas, DO  REFERRING DIAG: [phone] (ICD-10-CM) - Bilateral occipital neuralgia M54.2 (ICD-10-CM) - Neck pain  THERAPY DIAG:  Cervicalgia  Muscle weakness (generalized)  Rationale for Evaluation and Treatment: Rehabilitation  ONSET DATE: 09/11/2023 referral  SUBJECTIVE:  SUBJECTIVE STATEMENT: Pt reports she has not had a migraine since starting her shots, pt reports she is not having any neck pain or back pain today.  Hand dominance: Right  PERTINENT HISTORY:  Allergies, anxiety, arthritis, asthma, depression, DM, GERD, HLD  PAIN:  Are you having pain? Yes: NPRS scale: not rated/10 Pain location: suboccipital region Pain description: dull  PRECAUTIONS: None  WEIGHT BEARING RESTRICTIONS: No  FALLS:  Has patient fallen in last 6 months? No  LIVING ENVIRONMENT: Lives with: lives with their son Lives in: House/apartment Stairs: Yes: External: 20 steps; can reach both Has following equipment at home: None  OCCUPATION: not working  PLOF: Independent  PATIENT GOALS: "eliminate some of this migraine"   OBJECTIVE:  Note: Objective measures were completed at Evaluation unless otherwise noted.  DIAGNOSTIC FINDINGS:  Non contributory   PATIENT SURVEYS:  NDI 56% (28/50) Headache disability index: 74     COGNITION: Overall cognitive status: Within functional limits  for tasks assessed  SENSATION: WFL  POSTURE: rounded shoulders, forward head, posterior pelvic tilt, and flexed trunk   PALPATION: Slight increase in suboccipital tension   CERVICAL ROM:  Slightly guarded  TODAY'S TREATMENT:                                                                                                                               TherAct NDI: 8% disability (4/50) HDI: 78/100  CERVICAL ROM:   Active ROM AROM (deg) 10/22/23  Flexion 30  Extension 44  Right lateral flexion 37  Left lateral flexion 43  Right rotation 52  Left rotation 60   (Blank rows = not tested)    Trigger Point Dry-Needling  Treatment instructions: Expect mild to moderate muscle soreness. S/S of pneumothorax if dry needled over a lung field, and to seek immediate medical attention should they occur. Patient verbalized understanding of these instructions and education.  Patient Consent Given: Yes Education handout provided: Yes Muscles treated: L upper trap Treatment response/outcome: deep ache/pressure; muscle twitch detected  Pt declines to have any cervical muscles needled this date.   TherEx Seated B UT stretching 3 x 30 sec each  Seated SCM stretch 3 x 30 sec each B Seated cervical retraction x 10 reps x 3 sec hold Seated scap squeezes x 10 reps  Added to HEP, see bolded below  Education on posture and importance of changing positions throughout the day.     PATIENT EDUCATION:  Education details: TPDN (handout provided in previous session), continue HEP and added to HEP, scores on OM and functional implications, PT POC, postural education Person educated: Patient Education method: Explanation, Demonstration, and Handouts Education comprehension: verbalized understanding, returned demonstration, and needs further education  HOME EXERCISE PROGRAM: Access Code: [email or web address] URL: [email or web address] Date: 10/08/2023 Prepared by: Peter Congo  Exercises - Supine Suboccipital Release with Tennis Balls  - 1 x daily - 7 x weekly - 1 sets - 1 reps - 10 min  hold - Supine Thoracic Mobilization Towel Roll Vertical  - 1 x daily - 7 x weekly - 3 sets - 10 reps - Seated Upper Trapezius Stretch  - 1 x daily - 7 x weekly - 1 sets - 3-5 reps - 30 sec hold - Gentle Levator Scapulae Stretch  - 1 x daily - 7 x weekly - 1 sets - 3-5 reps - 30 sec hold - Sternocleidomastoid Stretch  - 1 x daily - 7 x weekly - 1 sets - 3-5 reps - 30 sec hold - Seated Cervical Retraction  - 1 x daily - 7 x weekly - 1 sets - 10 reps - 3 sec hold - Seated Scapular Retraction  - 1 x daily - 7 x weekly - 3 sets - 10 reps  ASSESSMENT:  CLINICAL IMPRESSION: Emphasis of skilled PT session on initially assessing LTG due to patient reports of an improvement in her migraines and neck pain with no complaints of pain initially. Pt does exhibit decreased disability level based on her improvement on NDI score from 28/50 initially to 4/50 this date. Pt does score higher on the HDI indicating increased disability level and then does indicate onset of neck pain with cervical AROM assessment. Pt agreeable to TPDN in her UT, not comfortable with cervical muscles being needled this date. Added stretches to HEP for continued management of pain. Pt can benefit from one more skilled therapy session to reassess HDI, review final HEP, and see if any other exercises are appropriate to add to her HEP. Continue POC.   OBJECTIVE IMPAIRMENTS: decreased knowledge of condition, improper body mechanics, postural dysfunction, and pain.   ACTIVITY LIMITATIONS: carrying, lifting, locomotion level, and caring for others  PARTICIPATION LIMITATIONS: interpersonal relationship, driving, shopping, and community activity  PERSONAL FACTORS: Age, Fitness, Sex, Social background, Time since onset of injury/illness/exacerbation, and 3+ comorbidities: see above  are also affecting patient's functional  outcome.   REHAB POTENTIAL: Fair etiology  CLINICAL DECISION MAKING: Stable/uncomplicated  EVALUATION COMPLEXITY: Low   GOALS: Goals reviewed with patient? Yes  SHORT TERM GOALS: = LTG based on PT POC length   LONG TERM GOALS: Target date: 10/24/23  Pt will be independent with final HEP for improved symptoms  Baseline: to be provided Goal status: INITIAL  2.  Patient will score </= 22/50 on the NDI to indicate a reduced level of disability due to neck pain Baseline: 28/50, 4/50 (10/30) Goal status: MET  3.  Patient will score </= 45 on the HDI to indicate a reduced level of disability related to her headaches. Baseline: 74 (eval), 78 (10/30) Goal status: IN PROGRESS  PLAN:  PT FREQUENCY: 1x/week  PT DURATION: 4 weeks  PLANNED INTERVENTIONS: Therapeutic exercises, Therapeutic activity, Neuromuscular re-education, Balance training, Gait training, Patient/Family education, Self Care, Joint mobilization, Stair training, Vestibular training, Canalith repositioning, Visual/preceptual remediation/compensation, DME instructions, Aquatic Therapy, Dry Needling, Spinal mobilization, Cryotherapy, Moist heat, Manual therapy, and Re-evaluation  PLAN FOR NEXT SESSION: assess HDI again, add to HEP for posture as well as UT and cervical ROM (more pain/tightness on L side), thread the needle? Discharge from OPPT   Peter Congo, PT Peter Congo, PT, DPT, CSRS   10/22/2023, 9:29 AM

## 2023-10-23 ENCOUNTER — Encounter: Payer: Medicaid Other | Attending: Internal Medicine | Admitting: Dietician

## 2023-10-23 ENCOUNTER — Encounter: Payer: Self-pay | Admitting: Dietician

## 2023-10-23 VITALS — Ht 63.0 in | Wt 230.0 lb

## 2023-10-23 DIAGNOSIS — E1069 Type 1 diabetes mellitus with other specified complication: Secondary | ICD-10-CM | POA: Insufficient documentation

## 2023-10-23 NOTE — Patient Instructions (Addendum)
Consider getting Ketone strips.  Test when you blood glucose is greater than 350 for more than 3 hours.  Call MD or go to the ER for moderate to high ketnoes.  Stay hydrated.  Bake rather than fry.  Fat is difficult to digest and can make your gastroparesis symptoms worse as well as make it difficult to control your blood glucose.  Aim for 2-3 Carb Choices per meal (30-45 grams) +/- 1 either way  Aim for 0-1 Carbs per snack if hungry  Include protein in moderation with your meals and snacks Consider reading food labels for Total Carbohydrate of foods Consider  increasing your activity level by walking, climbing stairs, or going to the gym for 30 minutes daily as tolerated Consistently use your FreeStyle Libre Continue taking medication as directed by MD

## 2023-10-23 NOTE — Progress Notes (Signed)
Medical Nutrition Therapy  Appointment Start time:  1630  Appointment End time:  1615 (patient had to pick her son up so appointment was shortened) Patient is here today alone.  She was last seen by another RD in our office on 07/29/2022.  Primary concerns today: Patient is unsure  Referral diagnosis: Type 1 Diabetes Preferred learning style: no preference indicated Learning readiness: contemplating, ready   NUTRITION ASSESSMENT  63" 230 lbs 10/23/2023 215 lbs 09/20/2023 when she couldn't eat due to gastroparesis  Clinical Medical Hx: Type 1 Diabetes, depression (PHQ-9 score of 16) which sometimes affects her diabetes care,  gastroparesis Medications: 72 units units q am, 24 units Humalog before each meal, Metformin XR, ambien, GERD, HLD, neuropathy (balance issues) She has a Medtronic insulin pump but has not used this since early 2024 as she states "it did not do right for her" and sometimes had a problem affording supplies. Labs: A1C 10.5% 09/08/2023 decreased from 10.8% 12/12/2022, GAD - positive Notable Signs/Symptoms: hyperglycemia CGM:  FreeStyle Libre 3   CGM Results from download: 10/23/2023  % Time CGM active:   94 %   (Goal >70%)  Average glucose:   244 mg/dL for 14 days  Glucose management indicator:   9.1 %  Time in range (70-180 mg/dL):   26 %   (Goal >16%)  Time High (181-250 mg/dL):   27 %   (Goal < 10%)  Time Very High (>250 mg/dL):    46 %   (Goal < 5%)  Time Low (54-69 mg/dL):   1 %   (Goal <9%)  Time Very Low (<54 mg/dL):   0 %   (Goal <6%)       Lifestyle & Dietary Hx Patient lives with her sister and her sister's children OR she stays with her son.  [employer], uses the Programme researcher, broadcasting/film/video.  Some food insecurity She is not working and is trying to get on disability due to plantar fascitis and carpel tunnel. Her son is encouraging her to go to the gym with him.   Supplements: MVI, vitamin D Sleep: poor - goes to sleep but wakes up every hour due to  stress.  Sometimes stress is diabetes related Stress / self-care: high stress Current average weekly physical activity: runs up and down her son's stairs and helps care for her 2 yo grandson  24-Hr Dietary Recall First Meal: boiled egg, plain instant oatmeal Snack: none Second Meal: fried chicken livers, cabbage, greens Snack: none Third Meal: green beans, corn Snack: none Beverages: water, Zero Pepsi, sugar free flavor packets, decaf coffee with no sugar creamer, splenda  NUTRITION DIAGNOSIS  NB-1.1 Food and nutrition-related knowledge deficit As related to balance of carbohydrate, protein, and fat.  As evidenced by diet hx and patient report.  NUTRITION INTERVENTION  Nutrition education (E-1) on the following topics:  Basic physiology of Type 1 diabetes Patient's depression and impact on diabetes care.  Grip into things you have to do for your health like taking your insulin. Review of medications. Basic proportion of plate How foods affect blood glucose Blood glucose and A1C goals Review of CGM Nutrition guidelines for gastroparesis  Handouts Provided Include  Gastroparesis nutrition therapy from AND How to Thrive:  A Guide for Your Journey with Diabetes by the ADA  Learning Style & Readiness for Change Teaching method utilized: Visual & Auditory  Demonstrated degree of understanding via: Teach Back  Barriers to learning/adherence to lifestyle change: motivation, stress, depression  Goals Established by Pt  Consider getting Ketone strips.  Test when you blood glucose is greater than 350 for more than 3 hours.  Call MD or go to the ER for moderate to high ketnoes.  Stay hydrated. Bake rather than fry.  Fat is difficult to digest and can make your gastroparesis symptoms worse as well as make it difficult to control your blood glucose. Aim for 2-3 Carb Choices per meal (30-45 grams) +/- 1 either way  Aim for 0-1 Carbs per snack if hungry  Include protein in moderation with  your meals and snacks Consider reading food labels for Total Carbohydrate of foods Consider  increasing your activity level by walking, climbing stairs, or going to the gym for 30 minutes daily as tolerated Consistently use your FreeStyle Libre Continue taking medication as directed by MD   MONITORING & EVALUATION Dietary intake, weekly physical activity in 2 months.  Next Steps  Patient is to call for questions.

## 2023-10-28 ENCOUNTER — Other Ambulatory Visit: Payer: Self-pay

## 2023-10-29 ENCOUNTER — Ambulatory Visit: Payer: Medicaid Other | Attending: Internal Medicine

## 2023-10-29 ENCOUNTER — Other Ambulatory Visit: Payer: Self-pay

## 2023-10-29 DIAGNOSIS — R2689 Other abnormalities of gait and mobility: Secondary | ICD-10-CM | POA: Diagnosis present

## 2023-10-29 DIAGNOSIS — M6281 Muscle weakness (generalized): Secondary | ICD-10-CM | POA: Diagnosis present

## 2023-10-29 NOTE — Therapy (Addendum)
OUTPATIENT PHYSICAL THERAPY CERVICAL TREATMENT/ RE-CERT/ DISCHARGE SUMMARY   Patient Name: Whitney Benton MRN: [id number] DOB:1971/02/17, 52 y.o., female Today's Date: 10/29/2023  PHYSICAL THERAPY DISCHARGE SUMMARY  Visits from Start of Care: 4  Current functional level related to goals / functional outcomes: See below   Remaining deficits: See below   Education / Equipment: PT POC, HEP, PT role in treating HA   Patient agrees to discharge. Patient goals were partially met. Patient is being discharged due to meeting the stated rehab goals.  END OF SESSION:  PT End of Session - 10/29/23 0849     Visit Number 4    Number of Visits 5    Date for PT Re-Evaluation 10/31/23    Authorization Type [employer] Medicaid    PT Start Time 0847    PT Stop Time 0909    PT Time Calculation (min) 22 min    Activity Tolerance Patient tolerated treatment well    Behavior During Therapy WFL for tasks assessed/performed             Past Medical History:  Diagnosis Date   Allergy    Anxiety    Arthritis    Asthma    Depression    Diabetes mellitus without complication (HCC)    GERD (gastroesophageal reflux disease)    Glaucoma suspect    Hyperlipidemia    Trichomonas infection    Past Surgical History:  Procedure Laterality Date   CESAREAN SECTION     UPPER GASTROINTESTINAL ENDOSCOPY     VAGINAL HYSTERECTOMY  12/23/2004   Fibroids, menorrhagia, benign pathology   Patient Active Problem List   Diagnosis Date Noted   DKA, type 1 (HCC) 06/20/2023   Essential hypertension 06/20/2023   Vitamin D deficiency 06/20/2023   Hyperlipidemia 03/06/2023   Primary insomnia 12/27/2022   Gallstone pancreatitis 12/03/2022   Chronic cholecystitis with calculus 12/03/2022   Morbid obesity (HCC) 04/04/2022   Former smoker 04/30/2021   Major depressive disorder, recurrent episode, moderate (HCC) 04/30/2021   Substance induced mood disorder (HCC) 03/22/2021   Generalized anxiety disorder  03/22/2021   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   DKA (diabetic ketoacidosis) (HCC) 01/23/2021   Glaucoma suspect 04/12/2020   DKA, type 2, not at goal [facility]) 10/02/2019   AKI (acute kidney injury) (HCC) 10/02/2019   Hyperkalemia 10/02/2019   Leukocytosis 10/02/2019   Abnormal LFTs 10/02/2019   Stressful life events affecting family and household 08/06/2019   New onset type 2 diabetes mellitus (HCC) 02/04/2019   Morbid obesity with BMI of 40.0-44.9, adult (HCC) 02/04/2019   Tobacco dependence 02/04/2019   Left arm weakness 12/06/2014   Paresthesias/numbness 12/06/2014   Tobacco abuse 11/14/2014   Depression    Mixed incontinence 06/27/2014   History of TVH in 2006 for fibroids and menorrhagia; benign pathology 09/08/2011    PCP: Jonah Blue, MD  REFERRING PROVIDER: Drema Dallas, DO  REFERRING DIAG: [phone] (ICD-10-CM) - Bilateral occipital neuralgia M54.2 (ICD-10-CM) - Neck pain  THERAPY DIAG:  Muscle weakness (generalized) - Plan: PT plan of care cert/re-cert  Other abnormalities of gait and mobility - Plan: PT plan of care cert/re-cert  Rationale for Evaluation and Treatment: Rehabilitation  ONSET DATE: 09/11/2023 referral  SUBJECTIVE:  SUBJECTIVE STATEMENT: Patient reports doing much better since receiving her shots. No longer has headaches. Denies falls. Agreeable to DC.   Hand dominance: Right  PERTINENT HISTORY:  Allergies, anxiety, arthritis, asthma, depression, DM, GERD, HLD  PAIN:  Are you having pain? No  PRECAUTIONS: None  PATIENT GOALS: "eliminate some of this migraine"   TODAY'S TREATMENT:                                                                                                                              HDI: 76       PATIENT EDUCATION:  Education details: Exam results, PT POC Person educated: Patient Education method: Explanation, Demonstration, and Handouts Education comprehension: verbalized understanding, returned demonstration, and needs further education  HOME EXERCISE PROGRAM: Access Code: [email or web address] URL: [email or web address] Date: 10/08/2023 Prepared by: Peter Congo  Exercises - Supine Suboccipital Release with Tennis Balls  - 1 x daily - 7 x weekly - 1 sets - 1 reps - 10 min hold - Supine Thoracic Mobilization Towel Roll Vertical  - 1 x daily - 7 x weekly - 3 sets - 10 reps - Seated Upper Trapezius Stretch  - 1 x daily - 7 x weekly - 1 sets - 3-5 reps - 30 sec hold - Gentle Levator Scapulae Stretch  - 1 x daily - 7 x weekly - 1 sets - 3-5 reps - 30 sec hold - Sternocleidomastoid Stretch  - 1 x daily - 7 x weekly - 1 sets - 3-5 reps - 30 sec hold - Seated Cervical Retraction  - 1 x daily - 7 x weekly - 1 sets - 10 reps - 3 sec hold - Seated Scapular Retraction  - 1 x daily - 7 x weekly - 3 sets - 10 reps  ASSESSMENT:  CLINICAL IMPRESSION: Patient seen for skilled PT session with emphasis on goal assessment and dc. She scored a 76 on the headache disability index despite noting that she no longer gets headaches and that the injections have been successful. Patient no longer requires skilled PT services at this time as she no longer experiences migraines. Patient to dc from PT.    OBJECTIVE IMPAIRMENTS: decreased knowledge of condition, improper body mechanics, postural dysfunction, and pain.   ACTIVITY LIMITATIONS: carrying, lifting, locomotion level, and caring for others  PARTICIPATION LIMITATIONS: interpersonal relationship, driving, shopping, and community activity  PERSONAL FACTORS: Age, Fitness, Sex, Social background, Time since onset of injury/illness/exacerbation, and 3+ comorbidities: see above  are also affecting patient's functional outcome.   REHAB  POTENTIAL: Fair etiology  CLINICAL DECISION MAKING: Stable/uncomplicated  EVALUATION COMPLEXITY: Low   GOALS: Goals reviewed with patient? Yes  SHORT TERM GOALS: = LTG based on PT POC length   LONG TERM GOALS: Target date: 10/24/23  Pt will be independent with final HEP for improved symptoms  Baseline: to be provided; provided Goal status: MET  2.  Patient will score </= 22/50 on the NDI to indicate  a reduced level of disability due to neck pain Baseline: 28/50, 4/50 (10/30) Goal status: MET  3.  Patient will score </= 45 on the HDI to indicate a reduced level of disability related to her headaches. Baseline: 74 (eval), 78 (10/30), 76 Goal status: NOT MET  PLAN:  PT FREQUENCY: 1x/week  PT DURATION: 4 weeks  PLANNED INTERVENTIONS: Therapeutic exercises, Therapeutic activity, Neuromuscular re-education, Balance training, Gait training, Patient/Family education, Self Care, Joint mobilization, Stair training, Vestibular training, Canalith repositioning, Visual/preceptual remediation/compensation, DME instructions, Aquatic Therapy, Dry Needling, Spinal mobilization, Cryotherapy, Moist heat, Manual therapy, and Re-evaluation  PLAN FOR NEXT SESSION: assess HDI again, add to HEP for posture as well as UT and cervical ROM (more pain/tightness on L side), thread the needle? Discharge from OPPT   Westley Foots, PT Westley Foots, PT, DPT, CBIS    10/29/2023, 9:26 AM

## 2023-10-29 NOTE — Addendum Note (Signed)
Addended by: Merry Lofty A on: 10/29/2023 09:26 AM   Modules accepted: Orders

## 2023-10-31 ENCOUNTER — Encounter: Payer: Self-pay | Admitting: Podiatry

## 2023-10-31 ENCOUNTER — Other Ambulatory Visit: Payer: Self-pay

## 2023-10-31 ENCOUNTER — Ambulatory Visit (INDEPENDENT_AMBULATORY_CARE_PROVIDER_SITE_OTHER): Payer: Medicaid Other

## 2023-10-31 ENCOUNTER — Ambulatory Visit (INDEPENDENT_AMBULATORY_CARE_PROVIDER_SITE_OTHER): Payer: Medicaid Other | Admitting: Podiatry

## 2023-10-31 VITALS — Ht 62.0 in | Wt 230.0 lb

## 2023-10-31 DIAGNOSIS — M722 Plantar fascial fibromatosis: Secondary | ICD-10-CM | POA: Diagnosis not present

## 2023-10-31 DIAGNOSIS — M7751 Other enthesopathy of right foot: Secondary | ICD-10-CM

## 2023-10-31 DIAGNOSIS — M79674 Pain in right toe(s): Secondary | ICD-10-CM

## 2023-10-31 DIAGNOSIS — M778 Other enthesopathies, not elsewhere classified: Secondary | ICD-10-CM

## 2023-10-31 MED ORDER — MELOXICAM 15 MG PO TABS
15.0000 mg | ORAL_TABLET | Freq: Every day | ORAL | 0 refills | Status: AC
Start: 2023-10-31 — End: 2023-11-14

## 2023-10-31 NOTE — Progress Notes (Unsigned)
Chief Complaint  Patient presents with   Toe Pain    Patient is here for right hallux toe pain    HPI: 52 y.o. female presents today for evaluation of right great toe pain.  Patient states that this has been going on for close to a month at this point.  She localizes the pain to the dorsal aspect of the right great toe along the extensor tendon and states that it radiates proximal of the foot.  She does have history of diabetes.  Her last A1c on file from May 2024 was 13, she does state that her blood sugar has been running better but still fluctuates up and down as of late.  Regarding her right great toe she denies any specific injury. She has previously been seen by Dr. 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  Patient denies any nausea, fever, chills, chest pain or shortness of breath.  Past Medical History:  Diagnosis Date   Allergy    Anxiety    Arthritis    Asthma    Depression    Diabetes mellitus without complication (HCC)    GERD (gastroesophageal reflux disease)    Glaucoma suspect    Hyperlipidemia    Trichomonas infection     Past Surgical History:  Procedure Laterality Date   CESAREAN SECTION     UPPER GASTROINTESTINAL ENDOSCOPY     VAGINAL HYSTERECTOMY  12/23/2004   Fibroids, menorrhagia, benign pathology    Allergies  Allergen Reactions   Aspirin Hives   Trulicity [Dulaglutide] Other (See Comments)    DC'd due to chronic gastric and abdominal pain with nausea   Oxycodone Nausea And Vomiting    ROS -negative except as stated in HPI   Physical Exam: There were no vitals filed for this visit.  General: The patient is alert and oriented x3 in no acute distress.  Dermatology: Interspaces are clear of maceration and debris.  Dry Pedal skin.  Vascular: Palpable pedal pulses bilaterally. Capillary refill within normal limits.  No appreciable edema.  No erythema or calor.  Neurological: Light touch  sensation grossly intact bilateral feet.   Musculoskeletal Exam: Muscle strength 4/5 to right hallux dorsiflexion along extensor hallucis longus tendon.  Tenderness on direct palpation of the tendon over the interphalangeal joint to the distal first metatarsal shaft region  Radiographic Exam:  Normal osseous mineralization. Joint spaces preserved.  No fractures or osseous irregularities noted.  Overall rectus foot type.  Assessment/Plan of Care: 1. Great toe pain, right   2. Extensor tendinitis of foot      Meds ordered this encounter  Medications   meloxicam (MOBIC) 15 MG tablet    Sig: Take 1 tablet (15 mg total) by mouth daily for 14 days. Take in the morning with food    Dispense:  14 tablet    Refill:  0   None  Discussed clinical findings with patient today.  # Right great toe pain with extensor tendinitis -Clinical findings and treatment plan discussed with patient - Short cam walker boot dispensed the patient to be worn over the next month. -2-week course of oral meloxicam to help manage inflammation. -Compressive anklet dispensed.  Discussed RICE therapy. -Feel that due to the patient's uncontrolled diabetes we will avoid corticosteroids for now. -Patient had been previously seen by Dr. Annamary Rummage for plantar fasciitis, Baxters neuritis.  We will defer further treatment on this for now.  Follow-up in 1 month.  Laquashia Mergenthaler L. Marchia Bond, AACFAS [facility]     [address] [place], [place] [address]                Office [phone]  Fax [phone]

## 2023-11-07 ENCOUNTER — Other Ambulatory Visit: Payer: Self-pay

## 2023-11-07 DIAGNOSIS — E1010 Type 1 diabetes mellitus with ketoacidosis without coma: Secondary | ICD-10-CM

## 2023-11-12 ENCOUNTER — Emergency Department (HOSPITAL_COMMUNITY)
Admission: EM | Admit: 2023-11-12 | Discharge: 2023-11-12 | Disposition: A | Discharge status: Home / Self Care | Payer: Medicaid Other | Attending: Emergency Medicine | Admitting: Emergency Medicine

## 2023-11-12 ENCOUNTER — Emergency Department (HOSPITAL_COMMUNITY): Payer: Medicaid Other

## 2023-11-12 ENCOUNTER — Encounter (HOSPITAL_COMMUNITY): Payer: Self-pay

## 2023-11-12 DIAGNOSIS — J45909 Unspecified asthma, uncomplicated: Secondary | ICD-10-CM | POA: Insufficient documentation

## 2023-11-12 DIAGNOSIS — Z7984 Long term (current) use of oral hypoglycemic drugs: Secondary | ICD-10-CM | POA: Diagnosis not present

## 2023-11-12 DIAGNOSIS — Z90710 Acquired absence of both cervix and uterus: Secondary | ICD-10-CM | POA: Diagnosis not present

## 2023-11-12 DIAGNOSIS — I10 Essential (primary) hypertension: Secondary | ICD-10-CM | POA: Insufficient documentation

## 2023-11-12 DIAGNOSIS — Z79899 Other long term (current) drug therapy: Secondary | ICD-10-CM | POA: Insufficient documentation

## 2023-11-12 DIAGNOSIS — Z794 Long term (current) use of insulin: Secondary | ICD-10-CM | POA: Diagnosis not present

## 2023-11-12 DIAGNOSIS — R112 Nausea with vomiting, unspecified: Secondary | ICD-10-CM | POA: Insufficient documentation

## 2023-11-12 DIAGNOSIS — E119 Type 2 diabetes mellitus without complications: Secondary | ICD-10-CM | POA: Diagnosis not present

## 2023-11-12 DIAGNOSIS — D72829 Elevated white blood cell count, unspecified: Secondary | ICD-10-CM | POA: Insufficient documentation

## 2023-11-12 DIAGNOSIS — R197 Diarrhea, unspecified: Secondary | ICD-10-CM | POA: Insufficient documentation

## 2023-11-12 DIAGNOSIS — Z9049 Acquired absence of other specified parts of digestive tract: Secondary | ICD-10-CM | POA: Diagnosis not present

## 2023-11-12 DIAGNOSIS — R109 Unspecified abdominal pain: Secondary | ICD-10-CM | POA: Diagnosis not present

## 2023-11-12 DIAGNOSIS — K760 Fatty (change of) liver, not elsewhere classified: Secondary | ICD-10-CM | POA: Diagnosis not present

## 2023-11-12 LAB — URINALYSIS, ROUTINE W REFLEX MICROSCOPIC
Bilirubin Urine: NEGATIVE
Glucose, UA: >=500 mg/dL — AB
Hgb urine dipstick: NEGATIVE
Ketones, ur: 20 mg/dL — AB
Leukocytes,Ua: NEGATIVE
Nitrite: NEGATIVE
Protein, ur: NEGATIVE mg/dL
Specific Gravity, Urine: 1.031 — ABNORMAL HIGH (ref 1.005–1.030)
pH: 5 (ref 5.0–8.0)

## 2023-11-12 LAB — COMPREHENSIVE METABOLIC PANEL
ALT: 21 U/L (ref 0–44)
AST: 23 U/L (ref 15–41)
Albumin: 4.4 g/dL (ref 3.5–5.0)
Alkaline Phosphatase: 112 U/L (ref 38–126)
Anion gap: 12 (ref 5–15)
BUN: 13 mg/dL (ref 6–20)
CO2: 25 mmol/L (ref 22–32)
Calcium: 9.7 mg/dL (ref 8.9–10.3)
Chloride: 101 mmol/L (ref 98–111)
Creatinine, Ser: 0.93 mg/dL (ref 0.44–1.00)
GFR, Estimated: >60 mL/min (ref >60)
Glucose, Bld: 354 mg/dL — ABNORMAL HIGH (ref 70–99)
Potassium: 4.1 mmol/L (ref 3.5–5.1)
Sodium: 138 mmol/L (ref 135–145)
Total Bilirubin: 0.9 mg/dL (ref <1.2)
Total Protein: 8.3 g/dL — ABNORMAL HIGH (ref 6.5–8.1)

## 2023-11-12 LAB — CBC
HCT: 44.5 % (ref 36.0–46.0)
Hemoglobin: 14.1 g/dL (ref 12.0–15.0)
MCH: 27.1 pg (ref 26.0–34.0)
MCHC: 31.7 g/dL (ref 30.0–36.0)
MCV: 85.6 fL (ref 80.0–100.0)
Platelets: 495 10*3/uL — ABNORMAL HIGH (ref 150–400)
RBC: 5.2 MIL/uL — ABNORMAL HIGH (ref 3.87–5.11)
RDW: 14.6 % (ref 11.5–15.5)
WBC: 10.6 10*3/uL — ABNORMAL HIGH (ref 4.0–10.5)
nRBC: 0 % (ref 0.0–0.2)

## 2023-11-12 LAB — LIPASE, BLOOD: Lipase: 25 U/L (ref 11–51)

## 2023-11-12 MED ORDER — ALUM & MAG HYDROXIDE-SIMETH 200-200-20 MG/5ML PO SUSP
30.0000 mL | Freq: Once | ORAL | Status: AC
  Administered 2023-11-12: 30 mL via ORAL
  Filled 2023-11-12: qty 30

## 2023-11-12 MED ORDER — ONDANSETRON 4 MG PO TBDP: 4.0000 mg | ORAL_TABLET | Freq: Three times a day (TID) | ORAL | 0 refills | Status: DC | PRN

## 2023-11-12 MED ORDER — SODIUM CHLORIDE 0.9 % IV BOLUS
500.0000 mL | Freq: Once | INTRAVENOUS | Status: AC
  Administered 2023-11-12: 500 mL via INTRAVENOUS

## 2023-11-12 MED ORDER — METOCLOPRAMIDE HCL 5 MG/ML IJ SOLN
10.0000 mg | Freq: Once | INTRAMUSCULAR | Status: AC
  Administered 2023-11-12: 10 mg via INTRAVENOUS
  Filled 2023-11-12: qty 2

## 2023-11-12 MED ORDER — IOHEXOL 300 MG/ML  SOLN
100.0000 mL | Freq: Once | INTRAMUSCULAR | Status: AC | PRN
  Administered 2023-11-12: 100 mL via INTRAVENOUS

## 2023-11-12 MED ORDER — ONDANSETRON HCL 4 MG/2ML IJ SOLN
4.0000 mg | Freq: Once | INTRAMUSCULAR | Status: AC
  Administered 2023-11-12: 4 mg via INTRAVENOUS
  Filled 2023-11-12: qty 2

## 2023-11-12 NOTE — ED Triage Notes (Addendum)
Pt c/o abdominal pain and n/v/d x2 days.  Pain score 10/10.  Pt reports she hasn't taken anything for symptoms.  Hx of gastroparesis.  Hx of DM.  Pt has not taken insulin today.

## 2023-11-12 NOTE — ED Provider Notes (Signed)
Patient's care assumed at 3:00 pending CT scan. CT scan returned and shows no acute abnormality.  Patient eating and drinking normally.  Patient discharged with follow-up instructions.   Elson Areas, [place] 11/12/23 1803    Lonell Grandchild, MD 11/12/23 [phone]

## 2023-11-12 NOTE — ED Provider Notes (Signed)
[place] EMERGENCY DEPARTMENT AT [facility] Provider Note   CSN: [id number] Arrival date & time: 11/12/23  1035     History  Chief Complaint  Patient presents with   Abdominal Pain   Emesis   Diarrhea    Whitney Benton is a 52 y.o. female.  52 year old female presents today for concern of epigastric abdominal pain.  Does endorse history of diabetes and gastroparesis.  No hematemesis, or melanotic stools, or bright red blood per rectum.  Endorses nausea and vomiting and inability to tolerate p.o. intake.  No chest pain.  Without dyspnea.  The history is provided by the patient. No language interpreter was used.       Home Medications Prior to Admission medications   Medication Sig Start Date End Date Taking? Authorizing Provider  Accu-Chek Softclix Lancets lancets Use to check blood sugar 3 times daily 08/11/23   Marcine Matar, MD  albuterol (VENTOLIN HFA) 108 (90 Base) MCG/ACT inhaler Inhale 2 puffs into the lungs every 6 (six) hours as needed for wheezing or shortness of breath (Cough). 02/26/23   Marcine Matar, MD  amLODipine (NORVASC) 10 MG tablet Take 1 tablet (10 mg total) by mouth daily. 05/07/23   Anders Simmonds, PA-C  atorvastatin (LIPITOR) 40 MG tablet Take 1 tablet (40 mg total) by mouth daily. 05/07/23   Anders Simmonds, PA-C  Blood Glucose Monitoring Suppl (ACCU-CHEK GUIDE) w/Device KIT Use to check blood sugar 3 times daily. 08/11/23   Marcine Matar, MD  Continuous Glucose Sensor (FREESTYLE LIBRE 3 SENSOR) MISC Use to check blood sugar continuously throughout the day. Replace sensors once every 14 days. Dx E11.65 05/07/23   Marcine Matar, MD  cycloSPORINE (RESTASIS) 0.05 % ophthalmic emulsion Apply 1 drop into both eyes twice a day 09/19/21     DULoxetine (CYMBALTA) 60 MG capsule Take 1 capsule (60 mg total) by mouth daily. 09/02/23   Shanna Cisco, NP  Erenumab-aooe (AIMOVIG) 140 MG/ML SOAJ Inject 140 mg into the skin every 28  (twenty-eight) days. 09/11/23   Drema Dallas, DO  gabapentin (NEURONTIN) 300 MG capsule Take 1 capsule (300 mg total) by mouth 3 (three) times daily. 09/02/23   Toy Cookey E, NP  glucose blood (ACCU-CHEK GUIDE) test strip Use to check blood sugar 3 times daily 08/11/23   Marcine Matar, MD  hydrOXYzine (ATARAX) 50 MG tablet Take 1 tablet (50 mg total) by mouth 3 (three) times daily. 09/02/23   Toy Cookey E, NP  insulin glargine, 2 Unit Dial, (TOUJEO MAX SOLOSTAR) 300 UNIT/ML Solostar Pen Inject 72 Units into the skin daily. 10/17/23   Marcine Matar, MD  insulin lispro (HUMALOG KWIKPEN) 100 UNIT/ML KwikPen Inject 24 Units into the skin 3 (three) times daily. 10/17/23   Marcine Matar, MD  Insulin Pen Needle (PEN NEEDLES 3/16") 31G X 5 MM MISC Use as directed with insulin pen 05/07/23   Georgian Co M, PA-C  LYBALVI 15-10 MG TABS Take 1 tablet by mouth daily. 09/02/23   Shanna Cisco, NP  meloxicam (MOBIC) 15 MG tablet Take 1 tablet (15 mg total) by mouth daily for 14 days. Take in the morning with food 10/31/23 11/14/23  Barbaraann Share, DPM  metFORMIN (GLUCOPHAGE-XR) 500 MG 24 hr tablet Take 1 tablet (500 mg total) by mouth 2 (two) times daily. 09/08/23   Marcine Matar, MD  metoprolol tartrate (LOPRESSOR) 50 MG tablet Take 1 tablet (50 mg total) by  mouth 2 (two) times daily. 05/07/23   Anders Simmonds, PA-C  metroNIDAZOLE (FLAGYL) 500 MG tablet Take 1 tablet (500 mg total) by mouth 2 (two) times daily. Patient not taking: Reported on 10/23/2023 08/12/23   Bernerd Limbo, CNM  Multiple Vitamins-Minerals (EYE VITAMINS PO) Take 1 tablet by mouth daily.    [provider]  omeprazole (PRILOSEC) 40 MG capsule TAKE 1 CAPSULE BY MOUTH ONCE DAILY 30 MINUTES BEFORE BREAKFAST 03/10/23   Meredith Pel, NP  ondansetron (ZOFRAN-ODT) 4 MG disintegrating tablet Take 1 tablet (4 mg total) by mouth every 8 (eight) hours as needed for nausea or vomiting. 07/07/23   Marcine Matar, MD  ondansetron (ZOFRAN-ODT) 4 MG disintegrating tablet Take 1 tablet (4 mg total) by mouth every 8 (eight) hours as needed. 09/20/23   Mannie Stabile, PA-C  SUMAtriptan (IMITREX) 100 MG tablet TAKE 1 TABLET EARLIEST ONSET OF MIGRAINE. MAY REPEAT IN 2 HOURS IF HEADACHE PERSISTS OR RECURS. MAXIMUM 2 TABLETS IN 24 HOURS. 03/11/23 03/10/24  Drema Dallas, DO  terconazole (TERAZOL 7) 0.4 % vaginal cream Place 1 applicator vaginally at bedtime. 08/12/23   Bernerd Limbo, CNM  triamcinolone cream (KENALOG) 0.1 % Apply 1 Application topically 2 (two) times daily. Patient not taking: Reported on 10/23/2023 08/12/22   Marcine Matar, MD  Vitamin D, Cholecalciferol, 10 MCG (400 UNIT) CAPS Take 400 Int'l Units/day by mouth daily. 09/10/23   Marcine Matar, MD  zolpidem (AMBIEN) 10 MG tablet Take 1 tablet (10 mg total) by mouth at bedtime as needed for sleep. 09/02/23   Shanna Cisco, NP  cetirizine (ZYRTEC) 10 MG tablet Take 1 tablet (10 mg total) by mouth daily. Patient not taking: No sig reported 03/31/19 03/03/20  Hoy Register, MD      Allergies    Aspirin, Trulicity [dulaglutide], and Oxycodone    Review of Systems   Review of Systems  Constitutional:  Negative for activity change, chills and fever.  Respiratory:  Negative for shortness of breath.   Cardiovascular:  Negative for chest pain.  Gastrointestinal:  Positive for abdominal pain, nausea and vomiting. Negative for abdominal distention.  Genitourinary:  Negative for dysuria.  Neurological:  Negative for weakness and light-headedness.  All other systems reviewed and are negative.   Physical Exam Updated Vital Signs BP 137/88 (BP Location: Left Arm)   Pulse (!) 120   Temp 99 F (37.2 C) (Oral)   Resp 16   Ht 5\' 2"  (1.575 m)   Wt 104.3 kg   SpO2 100%   BMI 42.07 kg/m  Physical Exam  ED Results / Procedures / Treatments   Labs (all labs ordered are listed, but only abnormal results are displayed) Labs  Reviewed  COMPREHENSIVE METABOLIC PANEL - Abnormal; Notable for the following components:      Result Value   Glucose, Bld 354 (*)    Total Protein 8.3 (*)    All other components within normal limits  CBC - Abnormal; Notable for the following components:   WBC 10.6 (*)    RBC 5.20 (*)    Platelets 495 (*)    All other components within normal limits  LIPASE, BLOOD  URINALYSIS, ROUTINE W REFLEX MICROSCOPIC    EKG None  Radiology No results found.  Procedures Procedures    Medications Ordered in ED Medications  alum & mag hydroxide-simeth (MAALOX/MYLANTA) 200-200-20 MG/5ML suspension 30 mL (30 mLs Oral Given 11/12/23 1404)  metoCLOPramide (REGLAN) injection 10 mg (  10 mg Intravenous Given 11/12/23 1405)  ondansetron (ZOFRAN) injection 4 mg (4 mg Intravenous Given 11/12/23 1404)  iohexol (OMNIPAQUE) 300 MG/ML solution 100 mL (100 mLs Intravenous Contrast Given 11/12/23 1410)    ED Course/ Medical Decision Making/ A&P                                 Medical Decision Making Amount and/or Complexity of Data Reviewed Labs: ordered. Radiology: ordered.  Risk OTC drugs. Prescription drug management.   Medical Decision Making / ED Course   This patient presents to the ED for concern of abdominal pain, nausea and vomiting, this involves an extensive number of treatment options, and is a complaint that carries with it a high risk of complications and morbidity.  The differential diagnosis includes gastroparesis flareup, gastroenteritis, colitis, pancreatitis  MDM: -year-old female presents today for concern of abdominal pain, with associated nausea and vomiting and inability to tolerate p.o. intake.  Hemodynamically stable with some tachycardia.  Will provide 500 mL fluid bolus, Zofran, GI cocktail, and Reglan.  Will add on CT abdomen pelvis.  CBC with mild leukocytosis.  No anemia.  CMP with preserved renal function.  Glucose of 354 otherwise without acute concern.  Lipase  within normal.  UA pending.  CT abdomen pelvis pending.  On reevaluation she reports significant improvement in symptoms.  Will p.o. challenge.  At the end of my shift patient is pending urine pelvis and UA.  Signed out to oncoming provider.  If these are reassuring patient can be discharged to follow-up with PCP.  Zofran prescribed.  Lab Tests: -I ordered, reviewed, and interpreted labs.   The pertinent results include:   Labs Reviewed  COMPREHENSIVE METABOLIC PANEL - Abnormal; Notable for the following components:      Result Value   Glucose, Bld 354 (*)    Total Protein 8.3 (*)    All other components within normal limits  CBC - Abnormal; Notable for the following components:   WBC 10.6 (*)    RBC 5.20 (*)    Platelets 495 (*)    All other components within normal limits  LIPASE, BLOOD  URINALYSIS, ROUTINE W REFLEX MICROSCOPIC      EKG  EKG Interpretation Date/Time:    Ventricular Rate:    PR Interval:    QRS Duration:    QT Interval:    QTC Calculation:   R Axis:      Text Interpretation:           Imaging Studies ordered: I ordered imaging studies including CT abdomen pelvis was ordered but did not result at the end of my shift. I independently visualized and interpreted imaging. I agree with the radiologist interpretation   Medicines ordered and prescription drug management: Meds ordered this encounter  Medications   alum & mag hydroxide-simeth (MAALOX/MYLANTA) 200-200-20 MG/5ML suspension 30 mL   metoCLOPramide (REGLAN) injection 10 mg   ondansetron (ZOFRAN) injection 4 mg   iohexol (OMNIPAQUE) 300 MG/ML solution 100 mL    -I have reviewed the patients home medicines and have made adjustments as needed   Reevaluation: After the interventions noted above, I reevaluated the patient and found that they have :improved  Co morbidities that complicate the patient evaluation  Past Medical History:  Diagnosis Date   Allergy    Anxiety    Arthritis     Asthma    Depression    Diabetes mellitus without  complication (HCC)    GERD (gastroesophageal reflux disease)    Glaucoma suspect    Hyperlipidemia    Trichomonas infection       Dispostion: Signout to oncoming provider to follow-up on CT imaging, UA.   Final Clinical Impression(s) / ED Diagnoses Final diagnoses:  Nausea and vomiting, unspecified vomiting type    Rx / DC Orders ED Discharge Orders          Ordered    ondansetron (ZOFRAN-ODT) 4 MG disintegrating tablet  Every 8 hours PRN        11/12/23 1504              Marita Kansas, PA-C 11/12/23 1504    Benjiman Core, MD 11/12/23 [phone]

## 2023-11-12 NOTE — Discharge Instructions (Addendum)
Your workup was reassuring.  No concerning findings on blood work, or CT.  You are able to tolerate p.o. intake. I have sent Zofran into the pharmacy for you.  For any concerning symptoms return to the emergency room.

## 2023-11-13 NOTE — Progress Notes (Incomplete)
S:     No chief complaint on file.  52 y.o. female who presents for diabetes evaluation, education, and management. PMH is significant for T1DM (LADA), HTN, MDD, migraines, obesity, HLD, vit d def, non-obstructive CAD. Patient was referred and last seen by Primary Care Provider, Dr. Laural Benes, on 09/08/23.  Patient was last seen by Pharmacist, Butch Penny, on 10/17/23.     Patient arrives in *** good spirits and presents without *** any assistance. ***Patient is accompanied by ***.   Patient was referred and last seen by Primary Care Provider, Dr. ***, on ***.  *** Patient was referred by *** on ***. Patient was last seen by Primary Care Provider, Dr. ***, on ***.   PMH is significant for ***.  At last visit, ***.   Patient reports Diabetes was diagnosed in 12/2018.   Family History: Diabetes (mother and father), HTN (mother and father) Smoking: former smoker 0.25 PPD for 8 years Alcohol: occasional use  Current diabetes medications include: *** Current hypertension medications include: *** Current hyperlipidemia medications include: ***  Patient reports adherence to taking all medications as prescribed.  *** Patient denies adherence with medications, reports missing *** medications *** times per week, on average.  Do you feel that your medications are working for you? {YES NO:22349} Have you been experiencing any side effects to the medications prescribed? {YES NO:22349} Do you have any problems obtaining medications due to transportation or finances? {YES [id number] Insurance coverage: ***  Patient {Actions; denies-reports:120008} hypoglycemic events.  Reported home fasting blood sugars: ***  Reported 2 hour post-meal/random blood sugars: ***.  Patient {Actions; denies-reports:120008} nocturia (nighttime urination).  Patient {Actions; denies-reports:120008} neuropathy (nerve pain). Patient {Actions; denies-reports:120008} visual changes. Patient {Actions;  denies-reports:120008} self foot exams.   Patient reported dietary habits: Eats *** meals/day Breakfast: *** Lunch: *** Dinner: *** Snacks: *** Drinks: ***  Within the past 12 months, did you worry whether your food would run out before you got money to buy more? {YES NO:22349} Within the past 12 months, did the food you bought run out, and you didn't have money to get more? {YES NO:22349} PHQ-9 Score: ***  Patient-reported exercise habits: ***   O:   ROS  Physical Exam  7 day average blood glucose: ***  Libre3 *** CGM Download today *** on *** % Time CGM is active: ***% Average Glucose: *** mg/dL Glucose Management Indicator: ***  Glucose Variability: ***% (goal <36%) Time in Goal:  - Time in range 70-180: ***% - Time above range: ***% - Time below range: ***% Observed patterns:   Lab Results  Component Value Date   HGBA1C 10.5 (A) 09/08/2023   There were no vitals filed for this visit.  Lipid Panel     Component Value Date/Time   CHOL 177 11/30/2021 1039   TRIG 181 (H) 11/30/2021 1039   HDL 40 11/30/2021 1039   CHOLHDL 4.4 11/30/2021 1039   CHOLHDL 3.2 11/14/2014 0707   VLDL 19 11/14/2014 0707   LDLCALC 105 (H) 11/30/2021 1039    Clinical Atherosclerotic Cardiovascular Disease (ASCVD): {YES/NO:21197} The 10-year ASCVD risk score (Arnett DK, et al., 2019) is: 20.3%   Values used to calculate the score:     Age: 49 years     Sex: Female     Is Non-Hispanic African American: Yes     Diabetic: Yes     Tobacco smoker: No     Systolic Blood Pressure: 147 mmHg     Is BP treated: Yes  HDL Cholesterol: 40 mg/dL     Total Cholesterol: 177 mg/dL   Patient is participating in a Managed Medicaid Plan:  {MM YES/NO:27447::"Yes"}   A/P: Diabetes longstanding *** currently ***. Patient is *** able to verbalize appropriate hypoglycemia management plan. Medication adherence appears ***. Control is suboptimal due to ***. -{Meds adjust:18428} basal insulin ***  Lantus/Basaglar/Semglee (insulin glargine) *** Tresiba (insulin degludec) from *** units to *** units daily in the morning. Patient will continue to titrate 1 unit every *** days if fasting blood sugar > 100mg /dl until fasting blood sugars reach goal or next visit.  -{Meds adjust:18428} rapid insulin *** Novolog (insulin aspart) *** Humalog (insulin lispro) from *** to ***.  -{Meds adjust:18428} GLP-1 *** Trulicity (dulaglutide) *** Ozempic (semaglutide) *** Mounjaro (tirzepatide) from *** mg to *** mg .  -{Meds adjust:18428} SGLT2-I *** Farxiga (dapagliflozin) *** Jardiance (empagliflozin) 10 mg. Counseled on sick day rules. -{Meds adjust:18428} metformin ***.  -Patient educated on purpose, proper use, and potential adverse effects of ***.  -Extensively discussed pathophysiology of diabetes, recommended lifestyle interventions, dietary effects on blood sugar control.  -Counseled on s/sx of and management of hypoglycemia.  -Next A1c anticipated ***.   ASCVD risk - primary ***secondary prevention in patient with diabetes. Last LDL is *** not at goal of <16 *** mg/dL. ASCVD risk factors include *** and 10-year ASCVD risk score of ***. {Desc; low/moderate/high:110033} intensity statin indicated.  -{Meds adjust:18428} ***statin *** mg.   Hypertension longstanding *** currently ***. Blood pressure goal of <130/80 *** mmHg. Medication adherence ***. Blood pressure control is suboptimal due to ***. -{Meds adjust:18428} *** mg.  Written patient instructions provided. Patient verbalized understanding of treatment plan.  Total time in face to face counseling *** minutes.    Follow-up:  Pharmacist *** PCP clinic visit in *** Patient seen with ***

## 2023-11-13 NOTE — Progress Notes (Signed)
S:     No chief complaint on file.  52 y.o. female who presents for diabetes evaluation, education, and management. PMH is significant for T1DM (LADA), HTN, MDD, migraines, obesity, HLD, vit d def, non-obstructive CAD. Patient was referred and last seen by Primary Care Provider, Dr. Laural Benes, on 09/08/23. A1c at that visit was 10.5. Patient was last seen by Pharmacist, Butch Penny, on 10/17/23. At that visit Josephine Igo report showed improved blood sugar control. Increased dose of Toujeo Max Solostar 300 u/mL, from 66 u to 72 units QAM. Increased dose of rapid insulin Humalog (insulin lispro) from 22 to 24 units TID AC.   Today, patient arrives in good spirits. Patient mentions she feels nauseated today. She reports the ED providers told her she has gastroparesis on 11/20. Patient mentions she is scared to eat because she is afraid to vomit. Due to this fear she is not adherent to her metformin XR or rapid insulin Humalog. She reports she worries that she will vomit the metformin out and will have low blood sugars if she takes the rapid insulin. Patient reports adherence to Toujeo 72 u daily.   Patient reports Diabetes was diagnosed in 12/2018.   Family/Social History:  Sxh: Former Smoker. Occasional marijuana use, Occasional Drinking Fhx: Mother DM, HTN, Depression. Father: DM, HTN  Current diabetes medications include: metformin XR 500 mg BID, Toujeo 72u daily, rapid insulin Humalog (insulin lispro) 24 u TID AC  Patient reports adherence to taking Toujeo as prescribed.   Insurance coverage: [place] [employer]  Patient denies hypoglycemic events.  Patient reports nocturia (nighttime urination).  Patient denies neuropathy (nerve pain). Patient denies visual changes. Patient reports self foot exams.   Patient reported dietary habits: -endorses less fried foods & salt intake, more baked foods and vegetables  Patient-reported exercise habits: walks occasionally   O:  Libre3 CGM  % Time CGM  is active: 96% Average Glucose: 253 mg/dL Glucose Management Indicator: 9.4  Glucose Variability: 33% (goal <36%) Time in Goal:  - Time in range 70-180: 23% - Time above range: 77% - Time below range: 0%  Lab Results  Component Value Date   HGBA1C 10.5 (A) 09/08/2023   There were no vitals filed for this visit.  Lipid Panel     Component Value Date/Time   CHOL 177 11/30/2021 1039   TRIG 181 (H) 11/30/2021 1039   HDL 40 11/30/2021 1039   CHOLHDL 4.4 11/30/2021 1039   CHOLHDL 3.2 11/14/2014 0707   VLDL 19 11/14/2014 0707   LDLCALC 105 (H) 11/30/2021 1039    Clinical Atherosclerotic Cardiovascular Disease (ASCVD): No  The 10-year ASCVD risk score (Arnett DK, et al., 2019) is: 20.3%   Values used to calculate the score:     Age: 61 years     Sex: Female     Is Non-Hispanic African American: Yes     Diabetic: Yes     Tobacco smoker: No     Systolic Blood Pressure: 147 mmHg     Is BP treated: Yes     HDL Cholesterol: 40 mg/dL     Total Cholesterol: 177 mg/dL   Patient is participating in a Managed Medicaid Plan:  Yes   A/P: Diabetes longstanding currently uncontrolled. Patient is able to verbalize appropriate hypoglycemia management plan. Medication adherence appears suboptimal. Control is suboptimal due to poor medication adherence which is due to patient's worry about vomiting. Patient was encouraged to continue to be adherent to medications. No changes were made today  due to poor adherence.  -Continued basal insulin Toujeo (insulin glargine) 72 units daily in the morning. -Continued rapid insulin Humalog (insulin aspart) 24 units TID. -Continued metformin XR 500 mg BID.  -Patient educated on purpose, proper use, and potential adverse effects of medications.  -Extensively discussed pathophysiology of diabetes, recommended lifestyle interventions, dietary effects on blood sugar control.  -Counseled on s/sx of and management of hypoglycemia.  -Next A1c anticipated 11/2023.    Diabetic Gastroparesis per Franky Macho: -She was in the ED 2 days ago with NV secondary to diabetic gastroparesis. She has a rxn for Zofran, but I do not believe this is helping as her main issue is due to her GI motility (lack thereof). She had an excellent response to IV Reglan in the ED but they did not discharge her with a PO rxn. I have provided her with a limited supply. QT interval is wnl. Patient was counseled regarding prn use and to report any Parkinsonian side effects. She tells me she has not experienced this before. Her blood sugar control has worsened in the last month because she's afraid to inject insulin d/t poor PO intake. The Reglan should help with this.  -Started metoclopramide 10 MG TID before meals. -Patient educated on purpose, proper use, and potential adverse effects of metoclopramide.  Written patient instructions provided. Patient verbalized understanding of treatment plan.  Total time in face to face counseling 30 minutes.    Follow-up:  Pharmacist: 01/05/2024 PCP clinic visit in: 12/08/2023 Endocrinology: 11/24/2023  Patient seen with: Erasmo Leventhal, PharmD Candidate  [employer] of 20[employer] SOP   Butch Penny, PharmD, [place], CPP Clinical Pharmacist [facility] [phone]

## 2023-11-14 ENCOUNTER — Other Ambulatory Visit: Payer: Self-pay | Admitting: Internal Medicine

## 2023-11-14 ENCOUNTER — Encounter: Payer: Self-pay | Admitting: Pharmacist

## 2023-11-14 ENCOUNTER — Ambulatory Visit: Payer: Medicaid Other | Attending: Family Medicine | Admitting: Pharmacist

## 2023-11-14 ENCOUNTER — Other Ambulatory Visit: Payer: Self-pay

## 2023-11-14 DIAGNOSIS — E785 Hyperlipidemia, unspecified: Secondary | ICD-10-CM

## 2023-11-14 DIAGNOSIS — E1169 Type 2 diabetes mellitus with other specified complication: Secondary | ICD-10-CM

## 2023-11-14 DIAGNOSIS — Z7984 Long term (current) use of oral hypoglycemic drugs: Secondary | ICD-10-CM

## 2023-11-14 DIAGNOSIS — Z794 Long term (current) use of insulin: Secondary | ICD-10-CM | POA: Diagnosis not present

## 2023-11-14 DIAGNOSIS — E1165 Type 2 diabetes mellitus with hyperglycemia: Secondary | ICD-10-CM

## 2023-11-14 DIAGNOSIS — E1143 Type 2 diabetes mellitus with diabetic autonomic (poly)neuropathy: Secondary | ICD-10-CM

## 2023-11-14 DIAGNOSIS — K3184 Gastroparesis: Secondary | ICD-10-CM | POA: Diagnosis not present

## 2023-11-14 MED ORDER — METOCLOPRAMIDE HCL 10 MG PO TABS
10.0000 mg | ORAL_TABLET | Freq: Three times a day (TID) | ORAL | 0 refills | Status: DC
  Filled 2023-11-14: qty 90, 30d supply, fill #0

## 2023-11-14 MED ORDER — FREESTYLE LIBRE 3 SENSOR MISC
6 refills | Status: DC
  Filled 2023-11-14: qty 2, fill #0
  Filled 2023-11-21: qty 2, 28d supply, fill #0

## 2023-11-17 ENCOUNTER — Other Ambulatory Visit: Payer: Medicaid Other

## 2023-11-17 ENCOUNTER — Other Ambulatory Visit: Payer: Self-pay

## 2023-11-17 DIAGNOSIS — E1010 Type 1 diabetes mellitus with ketoacidosis without coma: Secondary | ICD-10-CM

## 2023-11-18 ENCOUNTER — Encounter (HOSPITAL_COMMUNITY): Payer: Self-pay | Admitting: Psychiatry

## 2023-11-18 ENCOUNTER — Ambulatory Visit (HOSPITAL_COMMUNITY): Payer: Medicaid Other | Admitting: Clinical

## 2023-11-18 ENCOUNTER — Ambulatory Visit (INDEPENDENT_AMBULATORY_CARE_PROVIDER_SITE_OTHER): Payer: Medicaid Other | Admitting: Psychiatry

## 2023-11-18 DIAGNOSIS — F333 Major depressive disorder, recurrent, severe with psychotic symptoms: Secondary | ICD-10-CM

## 2023-11-18 DIAGNOSIS — F411 Generalized anxiety disorder: Secondary | ICD-10-CM

## 2023-11-18 MED ORDER — TRAZODONE HCL 50 MG PO TABS: 50.0000 mg | ORAL_TABLET | Freq: Every evening | ORAL | 3 refills | Status: DC | PRN

## 2023-11-18 MED ORDER — LYBALVI 15-10 MG PO TABS: 1.0000 | ORAL_TABLET | Freq: Every day | ORAL | 3 refills | Status: DC

## 2023-11-18 MED ORDER — HYDROXYZINE HCL 50 MG PO TABS: 50.0000 mg | ORAL_TABLET | Freq: Three times a day (TID) | ORAL | 3 refills | Status: DC

## 2023-11-18 MED ORDER — DULOXETINE HCL 60 MG PO CPEP: 60.0000 mg | ORAL_CAPSULE | Freq: Every day | ORAL | 3 refills | Status: DC

## 2023-11-18 MED ORDER — GABAPENTIN 300 MG PO CAPS: 300.0000 mg | ORAL_CAPSULE | Freq: Three times a day (TID) | ORAL | 3 refills | Status: DC

## 2023-11-18 NOTE — Progress Notes (Signed)
BH MD/PA/NP OP Progress Note        11/18/2023 1:03 PM Whitney Benton  MRN:  [id number]  Chief Complaint: "I no longer have hallucination but I sleepwalk"   HPI: 52 year old female seen today for follow up psychiatric evaluation.  She has a psychiatric history of insomnia, substance induced mood disorder, anxiety and depression.   She is currently managed on Lybalvi 15-10 mg nightly, hydroxyzine 25 mg three times daily as needed, gabapentin 300 mg 3 times daily, Ambien 10 mg nightly, and Cymbalta 60 mg daily (reports taking 80). Today she notes her medications are somewhat effective in managing her psychiatric condition.   Today was well-groomed, pleasant, cooperative, and engaged in conversation.  She informed Clinical research associate that for the last 3 months she has not had hallucinations.  She notes that she moved into her son's house and out of her parents house.  She also informed Clinical research associate that she has no longer been indulging in cocaine.  Patient however notes that recently she has been sleepwalking.  She notes that her sons informed her that she sleepwalks 3 days in a row.  Provider informed patient that this is a side effect of Ambien and informed her that it would be discontinued today.  She endorsed understanding and agreed.  Patient disliked doxepin because it caused her to gain weight. She has not tried trazodone in over a year and is agreeable to restart it.  Since her last visit she informed writer that she continues to be anxious and depressed.    Provider conducted a GAD-7 the patient scored 21, at her last visit she scored an 18.  Provider also conducted PHQ-9 the patient scored an 18, at her last visit she scored a 20.  She notes that her  appetite fluctuates.  She denies weight gain/loss.  Today she denies SI/HI/VAH mania, or paranoia.   Patient brought her 59 month year old and her 21 year old grandsons with her today. She notes that she is proud of them. She did note however she was feeling under  the weather and ask that the visit be cut short.   Today patient agreeable to restarting Trazodone 50-100 mg nightly as needed for sleep. At this time Ambien discontinued due to side effects (sleep walking). She will continue all other medications as prescribed. No other concerns noted at this time.  Visit Diagnosis:    ICD-10-CM   1. Severe recurrent major depressive disorder with psychotic features (HCC)  F33.3 traZODone (DESYREL) 50 MG tablet    LYBALVI 15-10 MG TABS    gabapentin (NEURONTIN) 300 MG capsule    DULoxetine (CYMBALTA) 60 MG capsule    2. Generalized anxiety disorder  F41.1 traZODone (DESYREL) 50 MG tablet    hydrOXYzine (ATARAX) 50 MG tablet    gabapentin (NEURONTIN) 300 MG capsule    DULoxetine (CYMBALTA) 60 MG capsule         Past Psychiatric History: anxiety and depression  Past Medical History:  Past Medical History:  Diagnosis Date   Allergy    Anxiety    Arthritis    Asthma    Depression    Diabetes mellitus without complication (HCC)    GERD (gastroesophageal reflux disease)    Glaucoma suspect    Hyperlipidemia    Trichomonas infection     Past Surgical History:  Procedure Laterality Date   CESAREAN SECTION     UPPER GASTROINTESTINAL ENDOSCOPY     VAGINAL HYSTERECTOMY  12/23/2004   Fibroids, menorrhagia, benign  pathology    Family Psychiatric History: Mother schizophrenia and bipolar disorder  Family History:  Family History  Problem Relation Age of Onset   Diabetes Mother    Mental illness Mother    Depression Mother    Hypertension Mother    Colon cancer Father 85   Hypertension Father    Diabetes Father    Colon cancer Paternal Grandmother    Esophageal cancer Neg Hx    Stomach cancer Neg Hx    Rectal cancer Neg Hx     Social History:  Social History   Socioeconomic History   Marital status: Significant Other    Spouse name: Not on file   Number of children: 3   Years of education: 14   Highest education level: Not on  file  Occupational History   Not on file  Tobacco Use   Smoking status: Former    Current packs/day: 0.00    Average packs/day: 0.3 packs/day for 8.0 years (2.0 ttl pk-yrs)    Types: Cigarettes    Start date: 2013    Quit date: 2021    Years since quitting: 3.9   Smokeless tobacco: Never  Vaping Use   Vaping status: Never Used  Substance and Sexual Activity   Alcohol use: Not Currently    Comment: occasional   Drug use: Yes    Types: Marijuana    Comment: occ   Sexual activity: Yes    Birth control/protection: Surgical  Other Topics Concern   Not on file  Social History Narrative   Patient lives at home with mother and father , one story    Patient has 3 children    Patient is single   Patient has 14 years of education    Patient is right handed    Caffeine none   Social Determinants of Health   Financial Resource Strain: Not on file  Food Insecurity: Food Insecurity Present (06/20/2023)   Hunger Vital Sign    Worried About Running Out of Food in the Last Year: Sometimes true    Ran Out of Food in the Last Year: Sometimes true  Transportation Needs: No Transportation Needs (06/20/2023)   PRAPARE - Administrator, Civil Service (Medical): No    Lack of Transportation (Non-Medical): No  Physical Activity: Not on file  Stress: Not on file  Social Connections: Unknown (03/07/2023)   [facility] from [facility]   Social Network    Social Network: Not on file    Allergies:  Allergies  Allergen Reactions   Aspirin Hives   Trulicity [Dulaglutide] Other (See Comments)    DC'd due to chronic gastric and abdominal pain with nausea   Oxycodone Nausea And Vomiting    Metabolic Disorder Labs: Lab Results  Component Value Date   HGBA1C 9.9 (H) 11/17/2023   MPG 237 11/17/2023   MPG 326.4 04/30/2023   No results found for: "PROLACTIN" Lab Results  Component Value Date   CHOL 224 (H) 11/17/2023   TRIG 163 (H) 11/17/2023   HDL 48 (L)  11/17/2023   CHOLHDL 4.7 11/17/2023   VLDL 19 11/14/2014   LDLCALC 146 (H) 11/17/2023   LDLCALC 105 (H) 11/30/2021   Lab Results  Component Value Date   TSH 2.460 04/30/2021   TSH 0.014 (L) 10/02/2019    Therapeutic Level Labs: No results found for: "LITHIUM" No results found for: "VALPROATE" No results found for: "CBMZ"  Current Medications: Current Outpatient Medications  Medication Sig Dispense Refill  traZODone (DESYREL) 50 MG tablet Take 1-2 tablets (50-100 mg total) by mouth at bedtime as needed for sleep. 60 tablet 3   Accu-Chek Softclix Lancets lancets Use to check blood sugar 3 times daily 100 each 6   albuterol (VENTOLIN HFA) 108 (90 Base) MCG/ACT inhaler Inhale 2 puffs into the lungs every 6 (six) hours as needed for wheezing or shortness of breath (Cough). 18 g 0   amLODipine (NORVASC) 10 MG tablet Take 1 tablet (10 mg total) by mouth daily. 90 tablet 2   atorvastatin (LIPITOR) 40 MG tablet Take 1 tablet (40 mg total) by mouth daily. 90 tablet 2   Blood Glucose Monitoring Suppl (ACCU-CHEK GUIDE) w/Device KIT Use to check blood sugar 3 times daily. 1 kit 0   Continuous Glucose Sensor (FREESTYLE LIBRE 3 SENSOR) MISC Use to check blood sugar continuously throughout the day. Replace sensors once every 14 days. Dx E11.65 2 each 6   cycloSPORINE (RESTASIS) 0.05 % ophthalmic emulsion Apply 1 drop into both eyes twice a day 180 each 3   DULoxetine (CYMBALTA) 60 MG capsule Take 1 capsule (60 mg total) by mouth daily. 30 capsule 3   Erenumab-aooe (AIMOVIG) 140 MG/ML SOAJ Inject 140 mg into the skin every 28 (twenty-eight) days. 1.12 mL 5   gabapentin (NEURONTIN) 300 MG capsule Take 1 capsule (300 mg total) by mouth 3 (three) times daily. 90 capsule 3   glucose blood (ACCU-CHEK GUIDE) test strip Use to check blood sugar 3 times daily 100 each 6   hydrOXYzine (ATARAX) 50 MG tablet Take 1 tablet (50 mg total) by mouth 3 (three) times daily. 90 tablet 3   insulin glargine, 2 Unit  Dial, (TOUJEO MAX SOLOSTAR) 300 UNIT/ML Solostar Pen Inject 72 Units into the skin daily. 9 mL 2   insulin lispro (HUMALOG KWIKPEN) 100 UNIT/ML KwikPen Inject 24 Units into the skin 3 (three) times daily. 15 mL 11   Insulin Pen Needle (PEN NEEDLES 3/16") 31G X 5 MM MISC Use as directed with insulin pen 100 each 2   LYBALVI 15-10 MG TABS Take 1 tablet by mouth daily. 15 tablet 3   metFORMIN (GLUCOPHAGE-XR) 500 MG 24 hr tablet Take 1 tablet (500 mg total) by mouth 2 (two) times daily. 180 tablet 0   metoCLOPramide (REGLAN) 10 MG tablet Take 1 tablet (10 mg total) by mouth 3 (three) times daily before meals. 90 tablet 0   metoprolol tartrate (LOPRESSOR) 50 MG tablet Take 1 tablet (50 mg total) by mouth 2 (two) times daily. 90 tablet 0   metroNIDAZOLE (FLAGYL) 500 MG tablet Take 1 tablet (500 mg total) by mouth 2 (two) times daily. (Patient not taking: Reported on 10/23/2023) 14 tablet 0   Multiple Vitamins-Minerals (EYE VITAMINS PO) Take 1 tablet by mouth daily.     omeprazole (PRILOSEC) 40 MG capsule TAKE 1 CAPSULE BY MOUTH ONCE DAILY 30 MINUTES BEFORE BREAKFAST 90 capsule 2   ondansetron (ZOFRAN-ODT) 4 MG disintegrating tablet Take 1 tablet (4 mg total) by mouth every 8 (eight) hours as needed. 20 tablet 0   SUMAtriptan (IMITREX) 100 MG tablet TAKE 1 TABLET EARLIEST ONSET OF MIGRAINE. MAY REPEAT IN 2 HOURS IF HEADACHE PERSISTS OR RECURS. MAXIMUM 2 TABLETS IN 24 HOURS. 10 tablet 5   terconazole (TERAZOL 7) 0.4 % vaginal cream Place 1 applicator vaginally at bedtime. 45 g 0   triamcinolone cream (KENALOG) 0.1 % Apply 1 Application topically 2 (two) times daily. (Patient not taking: Reported on 10/23/2023) 30 g  0   Vitamin D, Cholecalciferol, 10 MCG (400 UNIT) CAPS Take 400 Int'l Units/day by mouth daily. 100 capsule 1   No current facility-administered medications for this visit.     Musculoskeletal: Strength & Muscle Tone: within normal limits Gait & Station: normal Patient leans:  N/A  Psychiatric Specialty Exam: Review of Systems  There were no vitals taken for this visit.There is no height or weight on file to calculate BMI.  General Appearance: Well Groomed  Eye Contact:  Good  Speech:  Clear and Coherent  Volume:  Normal  Mood:  Anxious and Depressed  Affect:  Appropriate and Congruent  Thought Process:  Coherent, Goal Directed, and Linear  Orientation:  Full (Time, Place, and Person)  Thought Content: WDL and Logical,   Suicidal Thoughts:  No  Homicidal Thoughts:  No  Memory:  Immediate;   Good Recent;   Good Remote;   Good  Judgement:  Good  Insight:  Good  Psychomotor Activity:  Normal  Concentration:  Concentration: Good and Attention Span: Good  Recall:  Good  Fund of Knowledge: Good  Language: Good  Akathisia:  No  Handed:  Right  AIMS (if indicated): not done  Assets:  Communication Skills Desire for Improvement Housing Intimacy Physical Health Social Support  ADL's:  Intact  Cognition: WNL  Sleep:  Poor   Screenings: GAD-7    Flowsheet Row Clinical Support from 11/18/2023 in [facility] Office Visit from 09/08/2023 in [facility] [place] - A Dept Of [place]. [facility] Clinical Support from 09/02/2023 in [facility] Office Visit from 07/07/2023 in [facility] [place] - A Dept Of [place]. [facility] Clinical Support from 06/17/2023 in [facility]  Total GAD-7 Score 21 21 18 21 21       PHQ2-9    Flowsheet Row Clinical Support from 11/18/2023 in [facility] Nutrition from 10/23/2023 in [facility]  - A Dept Of [place]. [facility] Office Visit from 09/08/2023 in [facility] Comm [facility] [place] - A Dept Of [place]. [facility] Clinical Support from 09/02/2023 in [facility] Office Visit from 07/07/2023 in [facility]  Comm Health [place] - A Dept Of [place]. [facility]  PHQ-2 Total Score 6 2 6 6 6   PHQ-9 Total Score 18 16 21 20 21       Flowsheet Row Clinical Support from 11/18/2023 in [facility] ED from 11/12/2023 in [facility] Emergency Department at [facility] ED from 09/20/2023 in [facility] Emergency Department at [facility]  C-SSRS RISK CATEGORY No Risk No Risk No Risk        Assessment and Plan: Patient notes that her anxiety, depression, and sleep continues to be poor. She does note that her hallucinations have subside. Today patient agreeable to restarting Trazodone 50-100 mg nightly as needed for sleep. At this time Ambien discontinued due to side effects (sleep walking). She will continue all other medications as prescribed.    1. Severe recurrent major depressive disorder with psychotic features (HCC)  Start- traZODone (DESYREL) 50 MG tablet; Take 1-2 tablets (50-100 mg total) by mouth at bedtime as needed for sleep.  Dispense: 60 tablet; Refill: 3 Continue- LYBALVI 15-10 MG TABS; Take 1 tablet by mouth daily.  Dispense: 15 tablet; Refill: 3 Continue- gabapentin (NEURONTIN) 300 MG capsule; Take  1 capsule (300 mg total) by mouth 3 (three) times daily.  Dispense: 90 capsule; Refill: 3 Continue- DULoxetine (CYMBALTA) 60 MG capsule; Take 1 capsule (60 mg total) by mouth daily.  Dispense: 30 capsule; Refill: 3  2. Generalized anxiety disorder  Start- traZODone (DESYREL) 50 MG tablet; Take 1-2 tablets (50-100 mg total) by mouth at bedtime as needed for sleep.  Dispense: 60 tablet; Refill: 3 Continue- hydrOXYzine (ATARAX) 50 MG tablet; Take 1 tablet (50 mg total) by mouth 3 (three) times daily.  Dispense: 90 tablet; Refill: 3 Continue- gabapentin (NEURONTIN) 300 MG capsule; Take 1 capsule (300 mg total) by mouth 3 (three) times daily.  Dispense: 90 capsule; Refill: 3 Continue- DULoxetine (CYMBALTA) 60 MG capsule; Take 1 capsule (60 mg  total) by mouth daily.  Dispense: 30 capsule; Refill: 3     Follow-up in 2.5 months Follow-up with therapy   Shanna Cisco, NP 11/18/2023, 1:03 PM

## 2023-11-21 ENCOUNTER — Other Ambulatory Visit: Payer: Self-pay

## 2023-11-24 ENCOUNTER — Other Ambulatory Visit: Payer: Self-pay

## 2023-11-24 ENCOUNTER — Encounter: Payer: Self-pay | Admitting: "Endocrinology

## 2023-11-24 ENCOUNTER — Telehealth: Payer: Self-pay | Admitting: "Endocrinology

## 2023-11-24 ENCOUNTER — Ambulatory Visit (INDEPENDENT_AMBULATORY_CARE_PROVIDER_SITE_OTHER): Payer: Medicaid Other | Admitting: "Endocrinology

## 2023-11-24 VITALS — BP 130/90 | HR 106 | Ht 62.0 in | Wt 227.8 lb

## 2023-11-24 DIAGNOSIS — E782 Mixed hyperlipidemia: Secondary | ICD-10-CM

## 2023-11-24 DIAGNOSIS — F411 Generalized anxiety disorder: Secondary | ICD-10-CM

## 2023-11-24 DIAGNOSIS — E1065 Type 1 diabetes mellitus with hyperglycemia: Secondary | ICD-10-CM

## 2023-11-24 DIAGNOSIS — F333 Major depressive disorder, recurrent, severe with psychotic symptoms: Secondary | ICD-10-CM

## 2023-11-24 DIAGNOSIS — Z794 Long term (current) use of insulin: Secondary | ICD-10-CM

## 2023-11-24 DIAGNOSIS — E1165 Type 2 diabetes mellitus with hyperglycemia: Secondary | ICD-10-CM

## 2023-11-24 DIAGNOSIS — Z7984 Long term (current) use of oral hypoglycemic drugs: Secondary | ICD-10-CM

## 2023-11-24 MED ORDER — BAQSIMI ONE PACK 3 MG/DOSE NA POWD: 1.0000 | NASAL | 3 refills | Status: DC | PRN

## 2023-11-24 MED ORDER — BAQSIMI ONE PACK 3 MG/DOSE NA POWD
1.0000 | NASAL | 3 refills | Status: AC | PRN
  Filled 2023-11-24: qty 2, 30d supply, fill #0

## 2023-11-24 MED ORDER — HUMULIN R U-500 KWIKPEN 500 UNIT/ML ~~LOC~~ SOPN
PEN_INJECTOR | SUBCUTANEOUS | 3 refills | Status: DC
  Filled 2023-11-24: qty 12, 42d supply, fill #0

## 2023-11-24 MED ORDER — HUMULIN R U-500 KWIKPEN 500 UNIT/ML ~~LOC~~ SOPN: PEN_INJECTOR | SUBCUTANEOUS | 3 refills | Status: DC

## 2023-11-24 NOTE — Telephone Encounter (Signed)
Patient is requesting RX for Glucagon and U-500 be went to the [facility] at [address] (no [place]).

## 2023-11-24 NOTE — Telephone Encounter (Signed)
Medication was sent to [facility]

## 2023-11-24 NOTE — Patient Instructions (Signed)

## 2023-11-24 NOTE — Progress Notes (Addendum)
Outpatient Endocrinology Note [place], MD  11/24/23   Whitney Benton 1971/08/04 [id number]  Referring Provider: Marcine Matar, MD Primary Care Provider: Marcine Matar, MD Reason for consultation: Subjective   Assessment & Plan  Diagnoses and all orders for this visit:  Uncontrolled type 1 diabetes mellitus with hyperglycemia (HCC)  Long-term insulin use (HCC)  Long term (current) use of oral hypoglycemic drugs  Mixed hypercholesterolemia and hypertriglyceridemia  Other orders -     insulin regular human CONCENTRATED (HUMULIN R U-500 KWIKPEN) 500 UNIT/ML KwikPen; 70 units before break fast, 40 units before lunch and 30 units before supper - 15 min before meals -     Glucagon (BAQSIMI ONE PACK) 3 MG/DOSE POWD; Place 1 Device into the nose as needed (Low blood sugar with impaired consciousness).   Diabetes Type ?I complicated by hyperglycemia ,  Lab Results  Component Value Date   GFR 78.96 10/09/2022   Hba1c goal less than 7, current Hba1c is  Lab Results  Component Value Date   HGBA1C 9.9 (H) 11/17/2023   Will recommend the following: Toujeo 78 units qam Humalog 26 units tidac 15 min before meals Metformin XR 500 mg bid - gets diarrhea   If U500 is covered, pt will stop [place] and Humalog and start U500 70 units before break fast, 40 units before lunch and 30 units before supper, 15 min before meals  No known contraindications/side effects to any of above medications Glucagon discussed and prescribed with refills on 11/24/23  -Last LD and Tg are as follows: Lab Results  Component Value Date   LDLCALC 146 (H) 11/17/2023    Lab Results  Component Value Date   TRIG 163 (H) 11/17/2023   -On atorvastatin 40 mg every day, couldn't take before -Follow low fat diet and exercise   -Blood pressure goal <140/90 - Microalbumin/creatinine goal is < 30 -Last MA/Cr is as follows: Lab Results  Component Value Date   MICROALBUR 0.6 11/17/2023    -not on ACE/ARB  -diet changes including salt restriction -limit eating outside -counseled BP targets per standards of diabetes care -uncontrolled blood pressure can lead to retinopathy, nephropathy and cardiovascular and atherosclerotic heart disease  Reviewed and counseled on: -A1C target -Blood sugar targets -Complications of uncontrolled diabetes  -Checking blood sugar before meals and bedtime and bring log next visit -All medications with mechanism of action and side effects -Hypoglycemia management: rule of 15's, Glucagon Emergency Kit and medical alert ID -low-carb low-fat plate-method diet -At least 20 minutes of physical activity per day -Annual dilated retinal eye exam and foot exam -compliance and follow up needs -follow up as scheduled or earlier if problem gets worse  Call if blood sugar is less than 70 or consistently above 250    Take a 15 gm snack of carbohydrate at bedtime before you go to sleep if your blood sugar is less than 100.    If you are going to fast after midnight for a test or procedure, ask your physician for instructions on how to reduce/decrease your insulin dose.    Call if blood sugar is less than 70 or consistently above 250  -Treating a low sugar by rule of 15  (15 gms of sugar every 15 min until sugar is more than 70) If you feel your sugar is low, test your sugar to be sure If your sugar is low (less than 70), then take 15 grams of a fast acting Carbohydrate (3-4 glucose tablets or  glucose gel or 4 ounces of juice or regular soda) Recheck your sugar 15 min after treating low to make sure it is more than 70 If sugar is still less than 70, treat again with 15 grams of carbohydrate          Don't drive the hour of hypoglycemia  If unconscious/unable to eat or drink by mouth, use glucagon injection or nasal spray baqsimi and call 911. Can repeat again in 15 min if still unconscious.  Return in about 8 days (around 12/02/2023).   I have  reviewed current medications, nurse's notes, allergies, vital signs, past medical and surgical history, family medical history, and social history for this encounter. Counseled patient on symptoms, examination findings, lab findings, imaging results, treatment decisions and monitoring and prognosis. The patient understood the recommendations and agrees with the treatment plan. All questions regarding treatment plan were fully answered.  Whitney [place], MD  11/24/23    History of Present Illness Whitney Benton is a 52 y.o. year old female who presents for evaluation of Type ?I diabetes mellitus.  Whitney Benton was first diagnosed in 2020.   Diabetes education +  Home diabetes regimen: Toujeo 72 units qam Humalog 24 units tidac 15 min before meals Metformin XR 500 mg bid - gets diarrhea   COMPLICATIONS -  MI/Stroke -  retinopathy -  neuropathy -  nephropathy  BLOOD SUGAR DATA   CGM interpretation: At today's visit, we reviewed her CGM downloads. The full report is scanned in the media. Reviewing the CGM trends, BG are elevated most of the day with rare lows over night and before dinner.    Physical Exam  BP (!) 130/90   Pulse (!) 106   Ht 5\' 2"  (1.575 m)   Wt 227 lb 12.8 oz (103.3 kg)   SpO2 96%   BMI 41.67 kg/m    Constitutional: well developed, well nourished Head: normocephalic, atraumatic Eyes: sclera anicteric, no redness Neck: supple Lungs: normal respiratory effort Neurology: alert and oriented Skin: dry, no appreciable rashes Musculoskeletal: no appreciable defects Psychiatric: normal mood and affect Diabetic Foot Exam - Simple   No data filed      Current Medications Patient's Medications  New Prescriptions   GLUCAGON (BAQSIMI ONE PACK) 3 MG/DOSE POWD    Place 1 Device into the nose as needed (Low blood sugar with impaired consciousness).   INSULIN REGULAR HUMAN CONCENTRATED (HUMULIN R U-500 KWIKPEN) 500 UNIT/ML KWIKPEN    70 units before break fast, 40  units before lunch and 30 units before supper - 15 min before meals  Previous Medications   ACCU-CHEK SOFTCLIX LANCETS LANCETS    Use to check blood sugar 3 times daily   ALBUTEROL (VENTOLIN HFA) 108 (90 BASE) MCG/ACT INHALER    Inhale 2 puffs into the lungs every 6 (six) hours as needed for wheezing or shortness of breath (Cough).   AMLODIPINE (NORVASC) 10 MG TABLET    Take 1 tablet (10 mg total) by mouth daily.   ATORVASTATIN (LIPITOR) 40 MG TABLET    Take 1 tablet (40 mg total) by mouth daily.   BLOOD GLUCOSE MONITORING SUPPL (ACCU-CHEK GUIDE) W/DEVICE KIT    Use to check blood sugar 3 times daily.   CONTINUOUS GLUCOSE SENSOR (FREESTYLE LIBRE 3 SENSOR) MISC    Use to check blood sugar continuously throughout the day. Replace sensors once every 14 days. Dx E11.65   CYCLOSPORINE (RESTASIS) 0.05 % OPHTHALMIC EMULSION    Apply 1 drop into  both eyes twice a day   DULOXETINE (CYMBALTA) 60 MG CAPSULE    Take 1 capsule (60 mg total) by mouth daily.   ERENUMAB-AOOE (AIMOVIG) 140 MG/ML SOAJ    Inject 140 mg into the skin every 28 (twenty-eight) days.   GABAPENTIN (NEURONTIN) 300 MG CAPSULE    Take 1 capsule (300 mg total) by mouth 3 (three) times daily.   GLUCOSE BLOOD (ACCU-CHEK GUIDE) TEST STRIP    Use to check blood sugar 3 times daily   HYDROXYZINE (ATARAX) 50 MG TABLET    Take 1 tablet (50 mg total) by mouth 3 (three) times daily.   INSULIN PEN NEEDLE (PEN NEEDLES 3/16") 31G X 5 MM MISC    Use as directed with insulin pen   LYBALVI 15-10 MG TABS    Take 1 tablet by mouth daily.   METFORMIN (GLUCOPHAGE-XR) 500 MG 24 HR TABLET    Take 1 tablet (500 mg total) by mouth 2 (two) times daily.   METOCLOPRAMIDE (REGLAN) 10 MG TABLET    Take 1 tablet (10 mg total) by mouth 3 (three) times daily before meals.   METOPROLOL TARTRATE (LOPRESSOR) 50 MG TABLET    Take 1 tablet (50 mg total) by mouth 2 (two) times daily.   METRONIDAZOLE (FLAGYL) 500 MG TABLET    Take 1 tablet (500 mg total) by mouth 2 (two) times  daily.   MULTIPLE VITAMINS-MINERALS (EYE VITAMINS PO)    Take 1 tablet by mouth daily.   OMEPRAZOLE (PRILOSEC) 40 MG CAPSULE    TAKE 1 CAPSULE BY MOUTH ONCE DAILY 30 MINUTES BEFORE BREAKFAST   ONDANSETRON (ZOFRAN-ODT) 4 MG DISINTEGRATING TABLET    Take 1 tablet (4 mg total) by mouth every 8 (eight) hours as needed.   SUMATRIPTAN (IMITREX) 100 MG TABLET    TAKE 1 TABLET EARLIEST ONSET OF MIGRAINE. MAY REPEAT IN 2 HOURS IF HEADACHE PERSISTS OR RECURS. MAXIMUM 2 TABLETS IN 24 HOURS.   TERCONAZOLE (TERAZOL 7) 0.4 % VAGINAL CREAM    Place 1 applicator vaginally at bedtime.   TRAZODONE (DESYREL) 50 MG TABLET    Take 1-2 tablets (50-100 mg total) by mouth at bedtime as needed for sleep.   TRIAMCINOLONE CREAM (KENALOG) 0.1 %    Apply 1 Application topically 2 (two) times daily.   VITAMIN D, CHOLECALCIFEROL, 10 MCG (400 UNIT) CAPS    Take 400 Int'l Units/day by mouth daily.  Modified Medications   No medications on file  Discontinued Medications   INSULIN GLARGINE, 2 UNIT DIAL, (TOUJEO MAX SOLOSTAR) 300 UNIT/ML SOLOSTAR PEN    Inject 72 Units into the skin daily.   INSULIN LISPRO (HUMALOG KWIKPEN) 100 UNIT/ML KWIKPEN    Inject 24 Units into the skin 3 (three) times daily.    Allergies Allergies  Allergen Reactions   Aspirin Hives   Trulicity [Dulaglutide] Other (See Comments)    DC'd due to chronic gastric and abdominal pain with nausea   Oxycodone Nausea And Vomiting    Past Medical History Past Medical History:  Diagnosis Date   Allergy    Anxiety    Arthritis    Asthma    Depression    Diabetes mellitus without complication (HCC)    GERD (gastroesophageal reflux disease)    Glaucoma suspect    Hyperlipidemia    Trichomonas infection     Past Surgical History Past Surgical History:  Procedure Laterality Date   CESAREAN SECTION     UPPER GASTROINTESTINAL ENDOSCOPY     VAGINAL HYSTERECTOMY  12/23/2004   Fibroids, menorrhagia, benign pathology    Family History family history  includes Colon cancer in her paternal grandmother; Colon cancer (age of onset: 27) in her father; Depression in her mother; Diabetes in her father and mother; Hypertension in her father and mother; Mental illness in her mother.  Social History Social History   Socioeconomic History   Marital status: Significant Other    Spouse name: Not on file   Number of children: 3   Years of education: 14   Highest education level: Not on file  Occupational History   Not on file  Tobacco Use   Smoking status: Former    Current packs/day: 0.00    Average packs/day: 0.3 packs/day for 8.0 years (2.0 ttl pk-yrs)    Types: Cigarettes    Start date: 2013    Quit date: 2021    Years since quitting: 3.9   Smokeless tobacco: Never  Vaping Use   Vaping status: Never Used  Substance and Sexual Activity   Alcohol use: Not Currently    Comment: occasional   Drug use: Yes    Types: Marijuana    Comment: occ   Sexual activity: Yes    Birth control/protection: Surgical  Other Topics Concern   Not on file  Social History Narrative   Patient lives at home with mother and father , one story    Patient has 3 children    Patient is single   Patient has 14 years of education    Patient is right handed    Caffeine none   Social Determinants of Health   Financial Resource Strain: Not on file  Food Insecurity: Food Insecurity Present (06/20/2023)   Hunger Vital Sign    Worried About Running Out of Food in the Last Year: Sometimes true    Ran Out of Food in the Last Year: Sometimes true  Transportation Needs: No Transportation Needs (06/20/2023)   PRAPARE - Administrator, Civil Service (Medical): No    Lack of Transportation (Non-Medical): No  Physical Activity: Not on file  Stress: Not on file  Social Connections: Unknown (03/07/2023)   Received from [facility]   Social Network    Social Network: Not on file  Intimate Partner Violence: Not At Risk (06/20/2023)    Humiliation, Afraid, Rape, and Kick questionnaire    Fear of Current or Ex-Partner: No    Emotionally Abused: No    Physically Abused: No    Sexually Abused: No    Lab Results  Component Value Date   HGBA1C 9.9 (H) 11/17/2023   HGBA1C 10.5 (A) 09/08/2023   HGBA1C 13.0 (H) 04/30/2023   Lab Results  Component Value Date   CHOL 224 (H) 11/17/2023   Lab Results  Component Value Date   HDL 48 (L) 11/17/2023   Lab Results  Component Value Date   LDLCALC 146 (H) 11/17/2023   Lab Results  Component Value Date   TRIG 163 (H) 11/17/2023   Lab Results  Component Value Date   CHOLHDL 4.7 11/17/2023   Lab Results  Component Value Date   CREATININE 0.90 11/17/2023   Lab Results  Component Value Date   GFR 78.96 10/09/2022   Lab Results  Component Value Date   MICROALBUR 0.6 11/17/2023      Component Value Date/Time   NA 141 11/17/2023 1047   NA 140 12/12/2022 1036   K 4.1 11/17/2023 1047   CL 101 11/17/2023 1047  CO2 29 11/17/2023 1047   GLUCOSE 203 (H) 11/17/2023 1047   BUN 11 11/17/2023 1047   BUN 10 12/12/2022 1036   CREATININE 0.90 11/17/2023 1047   CALCIUM 10.1 11/17/2023 1047   PROT 7.5 11/17/2023 1047   PROT 7.0 11/30/2021 1039   ALBUMIN 4.4 11/12/2023 1130   ALBUMIN 4.4 11/30/2021 1039   AST 17 11/17/2023 1047   ALT 15 11/17/2023 1047   ALKPHOS 112 11/12/2023 1130   BILITOT 0.4 11/17/2023 1047   BILITOT 0.3 11/30/2021 1039   GFRNONAA >60 11/12/2023 1130   GFRAA >60 08/10/2020 2100      Latest Ref Rng & Units 11/17/2023   10:47 AM 11/12/2023   11:30 AM 09/20/2023    4:02 PM  BMP  Glucose 65 - 99 mg/dL 161  096  045   BUN 7 - 25 mg/dL 11  13  9    Creatinine 0.50 - 1.03 mg/dL 4.09  8.11  9.14   BUN/Creat Ratio 6 - 22 (calc) SEE NOTE:     Sodium 135 - 146 mmol/L 141  138  140   Potassium 3.5 - 5.3 mmol/L 4.1  4.1  2.9   Chloride 98 - 110 mmol/L 101  101  99   CO2 20 - 32 mmol/L 29  25  30    Calcium 8.6 - 10.4 mg/dL 78.2  9.7  9.4         Component Value Date/Time   WBC 10.6 (H) 11/12/2023 1130   RBC 5.20 (H) 11/12/2023 1130   HGB 14.1 11/12/2023 1130   HGB 13.3 12/12/2022 1036   HCT 44.5 11/12/2023 1130   HCT 40.4 12/12/2022 1036   PLT 495 (H) 11/12/2023 1130   PLT 436 12/12/2022 1036   MCV 85.6 11/12/2023 1130   MCV 86 12/12/2022 1036   MCH 27.1 11/12/2023 1130   MCHC 31.7 11/12/2023 1130   RDW 14.6 11/12/2023 1130   RDW 12.9 12/12/2022 1036   LYMPHSABS 1.1 06/20/2023 1122   LYMPHSABS 2.4 07/14/2019 1058   MONOABS 0.6 06/20/2023 1122   EOSABS 0.0 06/20/2023 1122   EOSABS 0.1 07/14/2019 1058   BASOSABS 0.0 06/20/2023 1122   BASOSABS 0.0 07/14/2019 1058     Parts of this note may have been dictated using voice recognition software. There may be variances in spelling and vocabulary which are unintentional. Not all errors are proofread. Please notify the Thereasa Parkin if any discrepancies are noted or if the meaning of any statement is not clear.

## 2023-11-25 ENCOUNTER — Other Ambulatory Visit: Payer: Self-pay

## 2023-11-27 ENCOUNTER — Other Ambulatory Visit: Payer: Self-pay

## 2023-11-28 ENCOUNTER — Ambulatory Visit (INDEPENDENT_AMBULATORY_CARE_PROVIDER_SITE_OTHER): Payer: Medicaid Other | Admitting: Podiatry

## 2023-11-28 ENCOUNTER — Encounter: Payer: Self-pay | Admitting: Podiatry

## 2023-11-28 VITALS — Ht 63.0 in | Wt 227.8 lb

## 2023-11-28 DIAGNOSIS — E119 Type 2 diabetes mellitus without complications: Secondary | ICD-10-CM

## 2023-11-28 DIAGNOSIS — M778 Other enthesopathies, not elsewhere classified: Secondary | ICD-10-CM

## 2023-11-28 DIAGNOSIS — M76891 Other specified enthesopathies of right lower limb, excluding foot: Secondary | ICD-10-CM | POA: Diagnosis not present

## 2023-11-28 MED ORDER — MELOXICAM 15 MG PO TABS: 15.0000 mg | ORAL_TABLET | Freq: Every day | ORAL | 0 refills | Status: AC

## 2023-11-28 NOTE — Progress Notes (Unsigned)
Chief Complaint  Patient presents with   Toe Pain    Pt is here to f/u on tendinitis in right greater toe, pt states pain is still there. Last A1C was 9.9    HPI: 52 y.o. female presents today for for follow-up evaluation of right foot EHL tendinitis with pain along first toe.  Patient reports minimal to no improvement of her symptoms.  She reports he is in the cam walker boot.  Her last A1c was down to 9.5 from 12.  Patient denies any nausea, fever, chills, chest pain or shortness of breath.  Past Medical History:  Diagnosis Date   Allergy    Anxiety    Arthritis    Asthma    Depression    Diabetes mellitus without complication (HCC)    GERD (gastroesophageal reflux disease)    Glaucoma suspect    Hyperlipidemia    Trichomonas infection     Past Surgical History:  Procedure Laterality Date   CESAREAN SECTION     UPPER GASTROINTESTINAL ENDOSCOPY     VAGINAL HYSTERECTOMY  12/23/2004   Fibroids, menorrhagia, benign pathology    Allergies  Allergen Reactions   Aspirin Hives   Trulicity [Dulaglutide] Other (See Comments)    DC'd due to chronic gastric and abdominal pain with nausea   Oxycodone Nausea And Vomiting    ROS -negative except as stated in HPI   Physical Exam: There were no vitals filed for this visit.  General: The patient is alert and oriented x3 in no acute distress.  Dermatology: Interspaces are clear of maceration and debris.  Dry Pedal skin.  Vascular: Palpable pedal pulses bilaterally. Capillary refill within normal limits.  No appreciable edema.  No erythema or calor.  Neurological: Light touch sensation grossly intact bilateral feet.   Musculoskeletal Exam: Muscle strength 4/5 to right hallux dorsiflexion along extensor hallucis longus tendon.  Tenderness on direct palpation of the tendon over the interphalangeal joint to the distal first metatarsal shaft region  Radiographic Exam: 10/31/2023 Normal osseous mineralization. Joint spaces  preserved.  No fractures or osseous irregularities noted.  Overall rectus foot type.  Assessment/Plan of Care: 1. Extensor tendinitis of foot   2. Type 2 diabetes mellitus without complication, unspecified whether long term insulin use (HCC)      Meds ordered this encounter  Medications   meloxicam (MOBIC) 15 MG tablet    Sig: Take 1 tablet (15 mg total) by mouth daily for 21 days.    Dispense:  21 tablet    Refill:  0   AMB REFERRAL TO PHYSICAL THERAPY  Discussed clinical findings with patient today.  # Right great toe pain with extensor tendinitis -Clinical findings and treatment plan discussed with patient - Corticosteroid shot was offered to the patient after explaining the risks and benefits of this intervention.  Patient would like to defer this for now. Will try to avoid oral steroids due to prolonged elevated glucose.   - Will extend patient's course of oral meloxicam for another 3 weeks - Placing referral for physical therapy. -Advised patient to stay in the cam boot for another 3 to 4 weeks.  Instructed patient to transition to stiff soled shoes at this time as tolerated -Follow-up in 4 to 6 weeks     Loveah Like L. Marchia Bond, AACFAS [facility]     [address]  [place], [place] [address]                Office [phone]  Fax [phone]

## 2023-12-02 ENCOUNTER — Other Ambulatory Visit: Payer: Self-pay

## 2023-12-02 ENCOUNTER — Ambulatory Visit (INDEPENDENT_AMBULATORY_CARE_PROVIDER_SITE_OTHER): Payer: Medicaid Other | Admitting: "Endocrinology

## 2023-12-02 ENCOUNTER — Encounter: Payer: Self-pay | Admitting: "Endocrinology

## 2023-12-02 VITALS — BP 100/80 | HR 115 | Ht 63.0 in | Wt 228.4 lb

## 2023-12-02 DIAGNOSIS — E1065 Type 1 diabetes mellitus with hyperglycemia: Secondary | ICD-10-CM | POA: Diagnosis not present

## 2023-12-02 DIAGNOSIS — E782 Mixed hyperlipidemia: Secondary | ICD-10-CM

## 2023-12-02 DIAGNOSIS — E1165 Type 2 diabetes mellitus with hyperglycemia: Secondary | ICD-10-CM

## 2023-12-02 DIAGNOSIS — Z7984 Long term (current) use of oral hypoglycemic drugs: Secondary | ICD-10-CM

## 2023-12-02 DIAGNOSIS — Z794 Long term (current) use of insulin: Secondary | ICD-10-CM

## 2023-12-02 LAB — GLUCOSE, POCT (MANUAL RESULT ENTRY): POC Glucose: 264 mg/dL — AB (ref 70–99)

## 2023-12-02 MED ORDER — DEXCOM G7 SENSOR MISC
1.0000 | 1 refills | Status: DC
  Filled 2023-12-02 – 2023-12-09 (×2): qty 3, 30d supply, fill #0
  Filled 2024-01-01 – 2024-01-07 (×2): qty 3, 30d supply, fill #1
  Filled 2024-02-02 – 2024-02-03 (×2): qty 3, 30d supply, fill #2
  Filled 2024-03-01: qty 3, 30d supply, fill #3
  Filled ????-??-??: fill #3

## 2023-12-02 NOTE — Progress Notes (Addendum)
Outpatient Endocrinology Note Whitney Abanda, MD  12/03/23   Whitney Benton 09-29-1971 [id number]  Referring Provider: Marcine Matar, MD Primary Care Provider: Marcine Matar, MD Reason for consultation: Subjective   Assessment & Plan  Diagnoses and all orders for this visit:  Uncontrolled type 1 diabetes mellitus with hyperglycemia (HCC) -     Continuous Glucose Sensor (DEXCOM G7 SENSOR) MISC; Change sensor every 10 days. -     POCT Glucose (CBG)  Long-term insulin use (HCC)  Long term (current) use of oral hypoglycemic drugs  Mixed hypercholesterolemia and hypertriglyceridemia    Diabetes Type I complicated by hyperglycemia, c-peptide -ve, GAD Ab + Lab Results  Component Value Date   GFR 78.96 10/09/2022   Hba1c goal less than 7, current Hba1c is  Lab Results  Component Value Date   HGBA1C 9.9 (H) 11/17/2023   Will recommend the following: U500 70 units before break fast, 40 units before lunch and 30 units before supper, 15 min before meals Stop Metformin XR 500 mg bid - max dose  Changed to DexCom G7 from [place] 3 per pt request as Josephine Igo keeps falling off, pt unable to afford skin tac  Stopped Toujeo 78 units qam and Humalog 26 units tidac 15 min before meals  No known contraindications/side effects to any of above medications Glucagon discussed and prescribed with refills on 11/24/23  -Last LD and Tg are as follows: Lab Results  Component Value Date   LDLCALC 146 (H) 11/17/2023    Lab Results  Component Value Date   TRIG 163 (H) 11/17/2023   -On atorvastatin 40 mg every day, compliant now  -Follow low fat diet and exercise   -Blood pressure goal <140/90 - Microalbumin/creatinine goal is < 30 -Last MA/Cr is as follows: Lab Results  Component Value Date   MICROALBUR 0.6 11/17/2023   -not on ACE/ARB  -diet changes including salt restriction -limit eating outside -counseled BP targets per standards of diabetes care -uncontrolled blood  pressure can lead to retinopathy, nephropathy and cardiovascular and atherosclerotic heart disease  Reviewed and counseled on: -A1C target -Blood sugar targets -Complications of uncontrolled diabetes  -Checking blood sugar before meals and bedtime and bring log next visit -All medications with mechanism of action and side effects -Hypoglycemia management: rule of 15's, Glucagon Emergency Kit and medical alert ID -low-carb low-fat plate-method diet -At least 20 minutes of physical activity per day -Annual dilated retinal eye exam and foot exam -compliance and follow up needs -follow up as scheduled or earlier if problem gets worse  Call if blood sugar is less than 70 or consistently above 250    Take a 15 gm snack of carbohydrate at bedtime before you go to sleep if your blood sugar is less than 100.    If you are going to fast after midnight for a test or procedure, ask your physician for instructions on how to reduce/decrease your insulin dose.    Call if blood sugar is less than 70 or consistently above 250  -Treating a low sugar by rule of 15  (15 gms of sugar every 15 min until sugar is more than 70) If you feel your sugar is low, test your sugar to be sure If your sugar is low (less than 70), then take 15 grams of a fast acting Carbohydrate (3-4 glucose tablets or glucose gel or 4 ounces of juice or regular soda) Recheck your sugar 15 min after treating low to make sure it is  more than 70 If sugar is still less than 70, treat again with 15 grams of carbohydrate          Don't drive the hour of hypoglycemia  If unconscious/unable to eat or drink by mouth, use glucagon injection or nasal spray baqsimi and call 911. Can repeat again in 15 min if still unconscious.  Return in about 8 days (around 12/10/2023).   I have reviewed current medications, nurse's notes, allergies, vital signs, past medical and surgical history, family medical history, and social history for this encounter.  Counseled patient on symptoms, examination findings, lab findings, imaging results, treatment decisions and monitoring and prognosis. The patient understood the recommendations and agrees with the treatment plan. All questions regarding treatment plan were fully answered.  Whitney [place], MD  12/03/23    History of Present Illness Whitney Benton is a 52 y.o. year old female who presents for evaluation of Type I diabetes mellitus.  Whitney Benton was first diagnosed in 2020.   Diabetes education +  Home diabetes regimen: Toujeo 72 units qam Humalog 24 units tidac 15 min before meals Metformin XR 500 mg bid - gets diarrhea   COMPLICATIONS -  MI/Stroke -  retinopathy -  neuropathy -  nephropathy  BLOOD SUGAR DATA Did not check BG sensor fell off  Physical Exam  BP 100/80   Pulse (!) 115   Ht 5\' 3"  (1.6 m)   Wt 228 lb 6.4 oz (103.6 kg)   SpO2 96%   BMI 40.46 kg/m    Constitutional: well developed, well nourished Head: normocephalic, atraumatic Eyes: sclera anicteric, no redness Neck: supple Lungs: normal respiratory effort Neurology: alert and oriented Skin: dry, no appreciable rashes Musculoskeletal: no appreciable defects Psychiatric: normal mood and affect Diabetic Foot Exam - Simple   No data filed      Current Medications Patient's Medications  New Prescriptions   CONTINUOUS GLUCOSE SENSOR (DEXCOM G7 SENSOR) MISC    Change sensor every 10 days.  Previous Medications   ACCU-CHEK SOFTCLIX LANCETS LANCETS    Use to check blood sugar 3 times daily   ALBUTEROL (VENTOLIN HFA) 108 (90 BASE) MCG/ACT INHALER    Inhale 2 puffs into the lungs every 6 (six) hours as needed for wheezing or shortness of breath (Cough).   AMLODIPINE (NORVASC) 10 MG TABLET    Take 1 tablet (10 mg total) by mouth daily.   ATORVASTATIN (LIPITOR) 40 MG TABLET    Take 1 tablet (40 mg total) by mouth daily.   BLOOD GLUCOSE MONITORING SUPPL (ACCU-CHEK GUIDE) W/DEVICE KIT    Use to check blood sugar  3 times daily.   CYCLOSPORINE (RESTASIS) 0.05 % OPHTHALMIC EMULSION    Apply 1 drop into both eyes twice a day   DULOXETINE (CYMBALTA) 60 MG CAPSULE    Take 1 capsule (60 mg total) by mouth daily.   ERENUMAB-AOOE (AIMOVIG) 140 MG/ML SOAJ    Inject 140 mg into the skin every 28 (twenty-eight) days.   GABAPENTIN (NEURONTIN) 300 MG CAPSULE    Take 1 capsule (300 mg total) by mouth 3 (three) times daily.   GLUCAGON (BAQSIMI ONE PACK) 3 MG/DOSE POWD    Place 1 Device into the nose as needed (Low blood sugar with impaired consciousness).   GLUCOSE BLOOD (ACCU-CHEK GUIDE) TEST STRIP    Use to check blood sugar 3 times daily   HYDROXYZINE (ATARAX) 50 MG TABLET    Take 1 tablet (50 mg total) by mouth 3 (three) times  daily.   INSULIN PEN NEEDLE (PEN NEEDLES 3/16") 31G X 5 MM MISC    Use as directed with insulin pen   INSULIN REGULAR HUMAN CONCENTRATED (HUMULIN R U-500 KWIKPEN) 500 UNIT/ML KWIKPEN    70 units before break fast, 40 units before lunch and 30 units before supper - 15 min before meals   LYBALVI 15-10 MG TABS    Take 1 tablet by mouth daily.   MELOXICAM (MOBIC) 15 MG TABLET    Take 1 tablet (15 mg total) by mouth daily for 21 days.   METOCLOPRAMIDE (REGLAN) 10 MG TABLET    Take 1 tablet (10 mg total) by mouth 3 (three) times daily before meals.   METOPROLOL TARTRATE (LOPRESSOR) 50 MG TABLET    Take 1 tablet (50 mg total) by mouth 2 (two) times daily.   METRONIDAZOLE (FLAGYL) 500 MG TABLET    Take 1 tablet (500 mg total) by mouth 2 (two) times daily.   MULTIPLE VITAMINS-MINERALS (EYE VITAMINS PO)    Take 1 tablet by mouth daily.   OMEPRAZOLE (PRILOSEC) 40 MG CAPSULE    TAKE 1 CAPSULE BY MOUTH ONCE DAILY 30 MINUTES BEFORE BREAKFAST   ONDANSETRON (ZOFRAN-ODT) 4 MG DISINTEGRATING TABLET    Take 1 tablet (4 mg total) by mouth every 8 (eight) hours as needed.   SUMATRIPTAN (IMITREX) 100 MG TABLET    TAKE 1 TABLET EARLIEST ONSET OF MIGRAINE. MAY REPEAT IN 2 HOURS IF HEADACHE PERSISTS OR RECURS. MAXIMUM 2  TABLETS IN 24 HOURS.   TERCONAZOLE (TERAZOL 7) 0.4 % VAGINAL CREAM    Place 1 applicator vaginally at bedtime.   TRAZODONE (DESYREL) 50 MG TABLET    Take 1-2 tablets (50-100 mg total) by mouth at bedtime as needed for sleep.   TRIAMCINOLONE CREAM (KENALOG) 0.1 %    Apply 1 Application topically 2 (two) times daily.   VITAMIN D, CHOLECALCIFEROL, 10 MCG (400 UNIT) CAPS    Take 400 Int'l Units/day by mouth daily.  Modified Medications   No medications on file  Discontinued Medications   CONTINUOUS GLUCOSE SENSOR (FREESTYLE LIBRE 3 SENSOR) MISC    Use to check blood sugar continuously throughout the day. Replace sensors once every 14 days. Dx E11.65   METFORMIN (GLUCOPHAGE-XR) 500 MG 24 HR TABLET    Take 1 tablet (500 mg total) by mouth 2 (two) times daily.    Allergies Allergies  Allergen Reactions   Aspirin Hives   Trulicity [Dulaglutide] Other (See Comments)    DC'd due to chronic gastric and abdominal pain with nausea   Oxycodone Nausea And Vomiting    Past Medical History Past Medical History:  Diagnosis Date   Allergy    Anxiety    Arthritis    Asthma    Depression    Diabetes mellitus without complication (HCC)    GERD (gastroesophageal reflux disease)    Glaucoma suspect    Hyperlipidemia    Trichomonas infection     Past Surgical History Past Surgical History:  Procedure Laterality Date   CESAREAN SECTION     UPPER GASTROINTESTINAL ENDOSCOPY     VAGINAL HYSTERECTOMY  12/23/2004   Fibroids, menorrhagia, benign pathology    Family History family history includes Colon cancer in her paternal grandmother; Colon cancer (age of onset: 74) in her father; Depression in her mother; Diabetes in her father and mother; Hypertension in her father and mother; Mental illness in her mother.  Social History Social History   Socioeconomic History   Marital status:  Significant Other    Spouse name: Not on file   Number of children: 3   Years of education: 14   Highest  education level: Not on file  Occupational History   Not on file  Tobacco Use   Smoking status: Former    Current packs/day: 0.00    Average packs/day: 0.3 packs/day for 8.0 years (2.0 ttl pk-yrs)    Types: Cigarettes    Start date: 2013    Quit date: 2021    Years since quitting: 3.9   Smokeless tobacco: Never  Vaping Use   Vaping status: Never Used  Substance and Sexual Activity   Alcohol use: Not Currently    Comment: occasional   Drug use: Yes    Types: Marijuana    Comment: occ   Sexual activity: Yes    Birth control/protection: Surgical  Other Topics Concern   Not on file  Social History Narrative   Patient lives at home with mother and father , one story    Patient has 3 children    Patient is single   Patient has 14 years of education    Patient is right handed    Caffeine none   Social Determinants of Health   Financial Resource Strain: Not on file  Food Insecurity: Food Insecurity Present (06/20/2023)   Hunger Vital Sign    Worried About Running Out of Food in the Last Year: Sometimes true    Ran Out of Food in the Last Year: Sometimes true  Transportation Needs: No Transportation Needs (06/20/2023)   PRAPARE - Administrator, Civil Service (Medical): No    Lack of Transportation (Non-Medical): No  Physical Activity: Not on file  Stress: Not on file  Social Connections: Unknown (03/07/2023)   [facility] from [facility]   Social Network    Social Network: Not on file  Intimate Partner Violence: Not At Risk (06/20/2023)   Humiliation, Afraid, Rape, and Kick questionnaire    Fear of Current or Ex-Partner: No    Emotionally Abused: No    Physically Abused: No    Sexually Abused: No    Lab Results  Component Value Date   HGBA1C 9.9 (H) 11/17/2023   HGBA1C 10.5 (A) 09/08/2023   HGBA1C 13.0 (H) 04/30/2023   Lab Results  Component Value Date   CHOL 224 (H) 11/17/2023   Lab Results  Component Value Date   HDL 48 (L)  11/17/2023   Lab Results  Component Value Date   LDLCALC 146 (H) 11/17/2023   Lab Results  Component Value Date   TRIG 163 (H) 11/17/2023   Lab Results  Component Value Date   CHOLHDL 4.7 11/17/2023   Lab Results  Component Value Date   CREATININE 0.90 11/17/2023   Lab Results  Component Value Date   GFR 78.96 10/09/2022   Lab Results  Component Value Date   MICROALBUR 0.6 11/17/2023      Component Value Date/Time   NA 141 11/17/2023 1047   NA 140 12/12/2022 1036   K 4.1 11/17/2023 1047   CL 101 11/17/2023 1047   CO2 29 11/17/2023 1047   GLUCOSE 203 (H) 11/17/2023 1047   BUN 11 11/17/2023 1047   BUN 10 12/12/2022 1036   CREATININE 0.90 11/17/2023 1047   CALCIUM 10.1 11/17/2023 1047   PROT 7.5 11/17/2023 1047   PROT 7.0 11/30/2021 1039   ALBUMIN 4.4 11/12/2023 1130   ALBUMIN 4.4 11/30/2021 1039   AST 17 11/17/2023  1047   ALT 15 11/17/2023 1047   ALKPHOS 112 11/12/2023 1130   BILITOT 0.4 11/17/2023 1047   BILITOT 0.3 11/30/2021 1039   GFRNONAA >60 11/12/2023 1130   GFRAA >60 08/10/2020 2100      Latest Ref Rng & Units 11/17/2023   10:47 AM 11/12/2023   11:30 AM 09/20/2023    4:02 PM  BMP  Glucose 65 - 99 mg/dL 638  756  433   BUN 7 - 25 mg/dL 11  13  9    Creatinine 0.50 - 1.03 mg/dL 2.95  1.88  4.16   BUN/Creat Ratio 6 - 22 (calc) SEE NOTE:     Sodium 135 - 146 mmol/L 141  138  140   Potassium 3.5 - 5.3 mmol/L 4.1  4.1  2.9   Chloride 98 - 110 mmol/L 101  101  99   CO2 20 - 32 mmol/L 29  25  30    Calcium 8.6 - 10.4 mg/dL 60.6  9.7  9.4        Component Value Date/Time   WBC 10.6 (H) 11/12/2023 1130   RBC 5.20 (H) 11/12/2023 1130   HGB 14.1 11/12/2023 1130   HGB 13.3 12/12/2022 1036   HCT 44.5 11/12/2023 1130   HCT 40.4 12/12/2022 1036   PLT 495 (H) 11/12/2023 1130   PLT 436 12/12/2022 1036   MCV 85.6 11/12/2023 1130   MCV 86 12/12/2022 1036   MCH 27.1 11/12/2023 1130   MCHC 31.7 11/12/2023 1130   RDW 14.6 11/12/2023 1130   RDW 12.9  12/12/2022 1036   LYMPHSABS 1.1 06/20/2023 1122   LYMPHSABS 2.4 07/14/2019 1058   MONOABS 0.6 06/20/2023 1122   EOSABS 0.0 06/20/2023 1122   EOSABS 0.1 07/14/2019 1058   BASOSABS 0.0 06/20/2023 1122   BASOSABS 0.0 07/14/2019 1058     Parts of this note may have been dictated using voice recognition software. There may be variances in spelling and vocabulary which are unintentional. Not all errors are proofread. Please notify the Thereasa Parkin if any discrepancies are noted or if the meaning of any statement is not clear.

## 2023-12-03 LAB — COMPREHENSIVE METABOLIC PANEL
AG Ratio: 1.3 (calc) (ref 1.0–2.5)
ALT: 15 U/L (ref 6–29)
AST: 17 U/L (ref 10–35)
Albumin: 4.3 g/dL (ref 3.6–5.1)
Alkaline phosphatase (APISO): 126 U/L (ref 37–153)
BUN: 11 mg/dL (ref 7–25)
CO2: 29 mmol/L (ref 20–32)
Calcium: 10.1 mg/dL (ref 8.6–10.4)
Chloride: 101 mmol/L (ref 98–110)
Creat: 0.9 mg/dL (ref 0.50–1.03)
Globulin: 3.2 g/dL (ref 1.9–3.7)
Glucose, Bld: 203 mg/dL — ABNORMAL HIGH (ref 65–99)
Potassium: 4.1 mmol/L (ref 3.5–5.3)
Sodium: 141 mmol/L (ref 135–146)
Total Bilirubin: 0.4 mg/dL (ref 0.2–1.2)
Total Protein: 7.5 g/dL (ref 6.1–8.1)

## 2023-12-03 LAB — MICROALBUMIN / CREATININE URINE RATIO
Creatinine, Urine: 176 mg/dL (ref 20–275)
Microalb Creat Ratio: 3 mg/g{creat} (ref <30)
Microalb, Ur: 0.6 mg/dL

## 2023-12-03 LAB — LIPID PANEL
Cholesterol: 224 mg/dL — ABNORMAL HIGH (ref <200)
HDL: 48 mg/dL — ABNORMAL LOW (ref >=50)
LDL Cholesterol (Calc): 146 mg/dL — ABNORMAL HIGH
Non-HDL Cholesterol (Calc): 176 mg/dL — ABNORMAL HIGH (ref <130)
Total CHOL/HDL Ratio: 4.7 (calc) (ref <5.0)
Triglycerides: 163 mg/dL — ABNORMAL HIGH (ref <150)

## 2023-12-03 LAB — HEMOGLOBIN A1C
Hgb A1c MFr Bld: 9.9 %{Hb} — ABNORMAL HIGH (ref <5.7)
Mean Plasma Glucose: 237 mg/dL
eAG (mmol/L): 13.2 mmol/L

## 2023-12-03 LAB — GAD65, IA-2, AND INSULIN AUTOANTIBODY SERUM
Glutamic Acid Decarb Ab: >250 [IU]/mL — ABNORMAL HIGH (ref <5)
Insulin Antibodies, Human: 20.1 U/mL — ABNORMAL HIGH (ref <0.4)
Insulin Antibodies, Human: <20.1 U/mL — ABNORMAL HIGH (ref <5.4)

## 2023-12-03 LAB — C-PEPTIDE: C-Peptide: 0.78 ng/mL — ABNORMAL LOW (ref 0.80–3.85)

## 2023-12-03 NOTE — Addendum Note (Signed)
Addended by: Altamese [place] on: 12/03/2023 10:12 AM   Modules accepted: Orders

## 2023-12-04 NOTE — Progress Notes (Signed)
I spoke to [facility] and she is aware of the results and recommendation

## 2023-12-05 ENCOUNTER — Encounter (HOSPITAL_COMMUNITY): Payer: Self-pay

## 2023-12-05 ENCOUNTER — Ambulatory Visit (HOSPITAL_COMMUNITY): Payer: Medicaid Other | Admitting: Clinical

## 2023-12-08 ENCOUNTER — Ambulatory Visit: Payer: Medicaid Other | Admitting: Internal Medicine

## 2023-12-09 ENCOUNTER — Other Ambulatory Visit: Payer: Self-pay

## 2023-12-09 ENCOUNTER — Encounter: Payer: Self-pay | Admitting: "Endocrinology

## 2023-12-09 ENCOUNTER — Ambulatory Visit (INDEPENDENT_AMBULATORY_CARE_PROVIDER_SITE_OTHER): Payer: Medicaid Other | Admitting: "Endocrinology

## 2023-12-09 VITALS — BP 138/90 | HR 116 | Resp 20 | Ht 63.0 in | Wt 226.6 lb

## 2023-12-09 DIAGNOSIS — E782 Mixed hyperlipidemia: Secondary | ICD-10-CM

## 2023-12-09 DIAGNOSIS — E1065 Type 1 diabetes mellitus with hyperglycemia: Secondary | ICD-10-CM

## 2023-12-09 NOTE — Progress Notes (Signed)
Outpatient Endocrinology Note Whitney Boyden, MD  12/09/23   Abel Presto 09-30-1971 [id number]  Referring Provider: Marcine Matar, MD Primary Care Provider: Marcine Matar, MD Reason for consultation: Subjective   Assessment & Plan  Diagnoses and all orders for this visit:  Uncontrolled type 1 diabetes mellitus with hyperglycemia (HCC)  Mixed hypercholesterolemia and hypertriglyceridemia   Diabetes Type I complicated by hyperglycemia, c-peptide -ve, GAD Ab + Lab Results  Component Value Date   GFR 78.96 10/09/2022   Hba1c goal less than 7, current Hba1c is  Lab Results  Component Value Date   HGBA1C 9.9 (H) 11/17/2023   Will recommend the following: U500 60 units before break fast, 40 units before lunch and 40-50 units before supper, 15 min before meals  Stop Metformin XR 500 mg bid - max tolerated dose. Patient did not experience any benefit from, only GI S/E  Changed to DexCom G7 from [place] 3 per pt request as Josephine Igo keeps falling off, pt unable to afford skin tac  Stopped Toujeo 78 units qam and Humalog 26 units tidac 15 min before meals  No known contraindications/side effects to any of above medications Glucagon discussed and prescribed with refills on 11/24/23  -Last LD and Tg are as follows: Lab Results  Component Value Date   LDLCALC 146 (H) 11/17/2023    Lab Results  Component Value Date   TRIG 163 (H) 11/17/2023   -On atorvastatin 40 mg every day, compliant now, repeat labs in 02/2023 -Follow low fat diet and exercise   -Blood pressure goal <140/90 - Microalbumin/creatinine goal is < 30 -Last MA/Cr is as follows: Lab Results  Component Value Date   MICROALBUR 0.6 11/17/2023   -not on ACE/ARB  -diet changes including salt restriction -limit eating outside -counseled BP targets per standards of diabetes care -uncontrolled blood pressure can lead to retinopathy, nephropathy and cardiovascular and atherosclerotic heart  disease  Reviewed and counseled on: -A1C target -Blood sugar targets -Complications of uncontrolled diabetes  -Checking blood sugar before meals and bedtime and bring log next visit -All medications with mechanism of action and side effects -Hypoglycemia management: rule of 15's, Glucagon Emergency Kit and medical alert ID -low-carb low-fat plate-method diet -At least 20 minutes of physical activity per day -Annual dilated retinal eye exam and foot exam -compliance and follow up needs -follow up as scheduled or earlier if problem gets worse  Call if blood sugar is less than 70 or consistently above 250    Take a 15 gm snack of carbohydrate at bedtime before you go to sleep if your blood sugar is less than 100.    If you are going to fast after midnight for a test or procedure, ask your physician for instructions on how to reduce/decrease your insulin dose.    Call if blood sugar is less than 70 or consistently above 250  -Treating a low sugar by rule of 15  (15 gms of sugar every 15 min until sugar is more than 70) If you feel your sugar is low, test your sugar to be sure If your sugar is low (less than 70), then take 15 grams of a fast acting Carbohydrate (3-4 glucose tablets or glucose gel or 4 ounces of juice or regular soda) Recheck your sugar 15 min after treating low to make sure it is more than 70 If sugar is still less than 70, treat again with 15 grams of carbohydrate  Don't drive the hour of hypoglycemia  If unconscious/unable to eat or drink by mouth, use glucagon injection or nasal spray baqsimi and call 911. Can repeat again in 15 min if still unconscious.  Return in about 9 days (around 12/18/2023) for video visit-double book at 9 am .   I have reviewed current medications, nurse's notes, allergies, vital signs, past medical and surgical history, family medical history, and social history for this encounter. Counseled patient on symptoms, examination findings,  lab findings, imaging results, treatment decisions and monitoring and prognosis. The patient understood the recommendations and agrees with the treatment plan. All questions regarding treatment plan were fully answered.  Whitney Ludlow, MD  12/09/23    History of Present Illness Whitney Benton is a 52 y.o. year old female who presents for evaluation of Type I diabetes mellitus.  ANNMARIE CHEREK was first diagnosed in 2020.   Diabetes education +  Home diabetes regimen: U500 70 units before break fast, 40 units before lunch and 30 units before supper, 15 min before meals  Previously on, Toujeo 72 units qam Humalog 24 units tidac 15 min before meals Metformin XR 500 mg bid - gets diarrhea   COMPLICATIONS -  MI/Stroke -  retinopathy -  neuropathy -  nephropathy  BLOOD SUGAR DATA  CGM interpretation: At today's visit, we reviewed her CGM downloads. The full report is scanned in the media. Reviewing the CGM trends, BG are low in the day and high overnight.   Physical Exam  BP (!) 138/90 (BP Location: Left Arm, Patient Position: Sitting, Cuff Size: Large)   Pulse (!) 116   Resp 20   Ht 5\' 3"  (1.6 m)   Wt 226 lb 9.6 oz (102.8 kg)   SpO2 97%   BMI 40.14 kg/m    Constitutional: well developed, well nourished Head: normocephalic, atraumatic Eyes: sclera anicteric, no redness Neck: supple Lungs: normal respiratory effort Neurology: alert and oriented Skin: dry, no appreciable rashes Musculoskeletal: no appreciable defects Psychiatric: normal mood and affect Diabetic Foot Exam - Simple   No data filed      Current Medications Patient's Medications  New Prescriptions   No medications on file  Previous Medications   ACCU-CHEK SOFTCLIX LANCETS LANCETS    Use to check blood sugar 3 times daily   ALBUTEROL (VENTOLIN HFA) 108 (90 BASE) MCG/ACT INHALER    Inhale 2 puffs into the lungs every 6 (six) hours as needed for wheezing or shortness of breath (Cough).   AMLODIPINE  (NORVASC) 10 MG TABLET    Take 1 tablet (10 mg total) by mouth daily.   ATORVASTATIN (LIPITOR) 40 MG TABLET    Take 1 tablet (40 mg total) by mouth daily.   BLOOD GLUCOSE MONITORING SUPPL (ACCU-CHEK GUIDE) W/DEVICE KIT    Use to check blood sugar 3 times daily.   CONTINUOUS GLUCOSE SENSOR (DEXCOM G7 SENSOR) MISC    Change sensor every 10 days.   CYCLOSPORINE (RESTASIS) 0.05 % OPHTHALMIC EMULSION    Apply 1 drop into both eyes twice a day   DULOXETINE (CYMBALTA) 60 MG CAPSULE    Take 1 capsule (60 mg total) by mouth daily.   ERENUMAB-AOOE (AIMOVIG) 140 MG/ML SOAJ    Inject 140 mg into the skin every 28 (twenty-eight) days.   GABAPENTIN (NEURONTIN) 300 MG CAPSULE    Take 1 capsule (300 mg total) by mouth 3 (three) times daily.   GLUCAGON (BAQSIMI ONE PACK) 3 MG/DOSE POWD    Place 1 Device  into the nose as needed (Low blood sugar with impaired consciousness).   GLUCOSE BLOOD (ACCU-CHEK GUIDE) TEST STRIP    Use to check blood sugar 3 times daily   HYDROXYZINE (ATARAX) 50 MG TABLET    Take 1 tablet (50 mg total) by mouth 3 (three) times daily.   INSULIN PEN NEEDLE (PEN NEEDLES 3/16") 31G X 5 MM MISC    Use as directed with insulin pen   INSULIN REGULAR HUMAN CONCENTRATED (HUMULIN R U-500 KWIKPEN) 500 UNIT/ML KWIKPEN    70 units before break fast, 40 units before lunch and 30 units before supper - 15 min before meals   LYBALVI 15-10 MG TABS    Take 1 tablet by mouth daily.   MELOXICAM (MOBIC) 15 MG TABLET    Take 1 tablet (15 mg total) by mouth daily for 21 days.   METOCLOPRAMIDE (REGLAN) 10 MG TABLET    Take 1 tablet (10 mg total) by mouth 3 (three) times daily before meals.   METOPROLOL TARTRATE (LOPRESSOR) 50 MG TABLET    Take 1 tablet (50 mg total) by mouth 2 (two) times daily.   METRONIDAZOLE (FLAGYL) 500 MG TABLET    Take 1 tablet (500 mg total) by mouth 2 (two) times daily.   MULTIPLE VITAMINS-MINERALS (EYE VITAMINS PO)    Take 1 tablet by mouth daily.   OMEPRAZOLE (PRILOSEC) 40 MG CAPSULE     TAKE 1 CAPSULE BY MOUTH ONCE DAILY 30 MINUTES BEFORE BREAKFAST   ONDANSETRON (ZOFRAN-ODT) 4 MG DISINTEGRATING TABLET    Take 1 tablet (4 mg total) by mouth every 8 (eight) hours as needed.   SUMATRIPTAN (IMITREX) 100 MG TABLET    TAKE 1 TABLET EARLIEST ONSET OF MIGRAINE. MAY REPEAT IN 2 HOURS IF HEADACHE PERSISTS OR RECURS. MAXIMUM 2 TABLETS IN 24 HOURS.   TERCONAZOLE (TERAZOL 7) 0.4 % VAGINAL CREAM    Place 1 applicator vaginally at bedtime.   TRAZODONE (DESYREL) 50 MG TABLET    Take 1-2 tablets (50-100 mg total) by mouth at bedtime as needed for sleep.   TRIAMCINOLONE CREAM (KENALOG) 0.1 %    Apply 1 Application topically 2 (two) times daily.   VITAMIN D, CHOLECALCIFEROL, 10 MCG (400 UNIT) CAPS    Take 400 Int'l Units/day by mouth daily.  Modified Medications   No medications on file  Discontinued Medications   No medications on file    Allergies Allergies  Allergen Reactions   Aspirin Hives   Trulicity [Dulaglutide] Other (See Comments)    DC'd due to chronic gastric and abdominal pain with nausea   Oxycodone Nausea And Vomiting    Past Medical History Past Medical History:  Diagnosis Date   Allergy    Anxiety    Arthritis    Asthma    Depression    Diabetes mellitus without complication (HCC)    GERD (gastroesophageal reflux disease)    Glaucoma suspect    Hyperlipidemia    Trichomonas infection     Past Surgical History Past Surgical History:  Procedure Laterality Date   CESAREAN SECTION     UPPER GASTROINTESTINAL ENDOSCOPY     VAGINAL HYSTERECTOMY  12/23/2004   Fibroids, menorrhagia, benign pathology    Family History family history includes Colon cancer in her paternal grandmother; Colon cancer (age of onset: 71) in her father; Depression in her mother; Diabetes in her father and mother; Hypertension in her father and mother; Mental illness in her mother.  Social History Social History   Socioeconomic History  Marital status: Significant Other    Spouse  name: Not on file   Number of children: 3   Years of education: 14   Highest education level: Not on file  Occupational History   Not on file  Tobacco Use   Smoking status: Former    Current packs/day: 0.00    Average packs/day: 0.3 packs/day for 8.0 years (2.0 ttl pk-yrs)    Types: Cigarettes    Start date: 2013    Quit date: 2021    Years since quitting: 3.9   Smokeless tobacco: Never  Vaping Use   Vaping status: Never Used  Substance and Sexual Activity   Alcohol use: Not Currently    Comment: occasional   Drug use: Yes    Types: Marijuana    Comment: occ   Sexual activity: Yes    Birth control/protection: Surgical  Other Topics Concern   Not on file  Social History Narrative   Patient lives at home with mother and father , one story    Patient has 3 children    Patient is single   Patient has 14 years of education    Patient is right handed    Caffeine none   Social Drivers of Corporate investment banker Strain: Not on file  Food Insecurity: Food Insecurity Present (06/20/2023)   Hunger Vital Sign    Worried About Running Out of Food in the Last Year: Sometimes true    Ran Out of Food in the Last Year: Sometimes true  Transportation Needs: No Transportation Needs (06/20/2023)   PRAPARE - Administrator, Civil Service (Medical): No    Lack of Transportation (Non-Medical): No  Physical Activity: Not on file  Stress: Not on file  Social Connections: Unknown (03/07/2023)   Received from [facility]   Social Network    Social Network: Not on file  Intimate Partner Violence: Not At Risk (06/20/2023)   Humiliation, Afraid, Rape, and Kick questionnaire    Fear of Current or Ex-Partner: No    Emotionally Abused: No    Physically Abused: No    Sexually Abused: No    Lab Results  Component Value Date   HGBA1C 9.9 (H) 11/17/2023   HGBA1C 10.5 (A) 09/08/2023   HGBA1C 13.0 (H) 04/30/2023   Lab Results  Component Value Date   CHOL 224  (H) 11/17/2023   Lab Results  Component Value Date   HDL 48 (L) 11/17/2023   Lab Results  Component Value Date   LDLCALC 146 (H) 11/17/2023   Lab Results  Component Value Date   TRIG 163 (H) 11/17/2023   Lab Results  Component Value Date   CHOLHDL 4.7 11/17/2023   Lab Results  Component Value Date   CREATININE 0.90 11/17/2023   Lab Results  Component Value Date   GFR 78.96 10/09/2022   Lab Results  Component Value Date   MICROALBUR 0.6 11/17/2023      Component Value Date/Time   NA 141 11/17/2023 1047   NA 140 12/12/2022 1036   K 4.1 11/17/2023 1047   CL 101 11/17/2023 1047   CO2 29 11/17/2023 1047   GLUCOSE 203 (H) 11/17/2023 1047   BUN 11 11/17/2023 1047   BUN 10 12/12/2022 1036   CREATININE 0.90 11/17/2023 1047   CALCIUM 10.1 11/17/2023 1047   PROT 7.5 11/17/2023 1047   PROT 7.0 11/30/2021 1039   ALBUMIN 4.4 11/12/2023 1130   ALBUMIN 4.4 11/30/2021 1039   AST  17 11/17/2023 1047   ALT 15 11/17/2023 1047   ALKPHOS 112 11/12/2023 1130   BILITOT 0.4 11/17/2023 1047   BILITOT 0.3 11/30/2021 1039   GFRNONAA >60 11/12/2023 1130   GFRAA >60 08/10/2020 2100      Latest Ref Rng & Units 11/17/2023   10:47 AM 11/12/2023   11:30 AM 09/20/2023    4:02 PM  BMP  Glucose 65 - 99 mg/dL 161  096  045   BUN 7 - 25 mg/dL 11  13  9    Creatinine 0.50 - 1.03 mg/dL 4.09  8.11  9.14   BUN/Creat Ratio 6 - 22 (calc) SEE NOTE:     Sodium 135 - 146 mmol/L 141  138  140   Potassium 3.5 - 5.3 mmol/L 4.1  4.1  2.9   Chloride 98 - 110 mmol/L 101  101  99   CO2 20 - 32 mmol/L 29  25  30    Calcium 8.6 - 10.4 mg/dL 78.2  9.7  9.4        Component Value Date/Time   WBC 10.6 (H) 11/12/2023 1130   RBC 5.20 (H) 11/12/2023 1130   HGB 14.1 11/12/2023 1130   HGB 13.3 12/12/2022 1036   HCT 44.5 11/12/2023 1130   HCT 40.4 12/12/2022 1036   PLT 495 (H) 11/12/2023 1130   PLT 436 12/12/2022 1036   MCV 85.6 11/12/2023 1130   MCV 86 12/12/2022 1036   MCH 27.1 11/12/2023 1130   MCHC  31.7 11/12/2023 1130   RDW 14.6 11/12/2023 1130   RDW 12.9 12/12/2022 1036   LYMPHSABS 1.1 06/20/2023 1122   LYMPHSABS 2.4 07/14/2019 1058   MONOABS 0.6 06/20/2023 1122   EOSABS 0.0 06/20/2023 1122   EOSABS 0.1 07/14/2019 1058   BASOSABS 0.0 06/20/2023 1122   BASOSABS 0.0 07/14/2019 1058     Parts of this note may have been dictated using voice recognition software. There may be variances in spelling and vocabulary which are unintentional. Not all errors are proofread. Please notify the Thereasa Parkin if any discrepancies are noted or if the meaning of any statement is not clear.

## 2023-12-09 NOTE — Patient Instructions (Signed)
Will recommend the following: U500 60 units before break fast, 40 units before lunch and 40-50 units before supper, 15 min before meals

## 2023-12-10 ENCOUNTER — Encounter: Payer: Self-pay | Admitting: "Endocrinology

## 2023-12-10 ENCOUNTER — Ambulatory Visit: Payer: Medicaid Other | Admitting: "Endocrinology

## 2023-12-18 ENCOUNTER — Telehealth (INDEPENDENT_AMBULATORY_CARE_PROVIDER_SITE_OTHER): Payer: Medicaid Other | Admitting: "Endocrinology

## 2023-12-18 ENCOUNTER — Other Ambulatory Visit: Payer: Self-pay

## 2023-12-18 ENCOUNTER — Encounter: Payer: Self-pay | Admitting: "Endocrinology

## 2023-12-18 DIAGNOSIS — E1065 Type 1 diabetes mellitus with hyperglycemia: Secondary | ICD-10-CM | POA: Diagnosis not present

## 2023-12-18 DIAGNOSIS — Z794 Long term (current) use of insulin: Secondary | ICD-10-CM | POA: Diagnosis not present

## 2023-12-18 DIAGNOSIS — E782 Mixed hyperlipidemia: Secondary | ICD-10-CM | POA: Diagnosis not present

## 2023-12-18 MED ORDER — HUMULIN R U-500 KWIKPEN 500 UNIT/ML ~~LOC~~ SOPN
PEN_INJECTOR | SUBCUTANEOUS | 3 refills | Status: DC
  Filled 2023-12-18: qty 12, fill #0
  Filled 2024-01-01: qty 12, 40d supply, fill #0
  Filled 2024-02-02: qty 12, 40d supply, fill #1

## 2023-12-18 MED ORDER — HUMULIN R U-500 KWIKPEN 500 UNIT/ML ~~LOC~~ SOPN
PEN_INJECTOR | SUBCUTANEOUS | 3 refills | Status: DC
  Filled 2023-12-18: qty 12, fill #0

## 2023-12-18 NOTE — Patient Instructions (Signed)
Will recommend the following: U500 60 units before break fast, 40 units before lunch and 50-55 units before supper, 30 min before meals

## 2023-12-18 NOTE — Progress Notes (Signed)
The patient reports they are currently: Whitney Benton. It was a video visit with the patient on the date of service. I spent an additional 5-10 minutes on pre- and post-visit activities on the date of service.   The patient was physically located in [place] or a state in which I am permitted to provide care. The patient and/or parent/guardian understood that s/he may incur co-pays and cost sharing, and agreed to the telemedicine visit. The visit was reasonable and appropriate under the circumstances given the patient's presentation at the time.  The patient and/or parent/guardian has been advised of the potential risks and limitations of this mode of treatment (including, but not limited to, the absence of in-person examination) and has agreed to be treated using telemedicine. The patient's/patient's family's questions regarding telemedicine have been answered.   The patient and/or parent/guardian has also been advised to contact their provider's office for worsening conditions, and seek emergency medical treatment and/or call 911 if the patient deems either necessary.    Outpatient Endocrinology Note Altamese [place], MD  12/18/23   Whitney Benton [birth year] [id number]  Referring Provider: Marcine Matar, MD Primary Care Provider: Marcine Matar, MD Reason for consultation: Subjective   Assessment & Plan  Diagnoses and all orders for this visit:  Uncontrolled type 1 diabetes mellitus with hyperglycemia (HCC) -     Discontinue: insulin regular human CONCENTRATED (HUMULIN R U-500 KWIKPEN) 500 UNIT/ML KwikPen; 60 units before break fast, 40 units before lunch and 50 units before supper - 15 min before meals -     insulin regular human CONCENTRATED (HUMULIN R U-500 KWIKPEN) 500 UNIT/ML KwikPen; 60 units before break fast, 40 units before lunch and 50 units before supper - 30 min before meals  Long-term insulin use (HCC)  Mixed hypercholesterolemia and  hypertriglyceridemia    Diabetes Type I complicated by hyperglycemia, c-peptide -ve, GAD Ab + Lab Results  Component Value Date   GFR 78.96 10/09/2022   Hba1c goal less than 7, current Hba1c is  Lab Results  Component Value Date   HGBA1C 9.9 (H) 11/17/2023   Will recommend the following: U500 60 units before break fast, 40 units before lunch and 50-55 units before supper, 30 min before meals  Stop Metformin XR 500 mg bid - max tolerated dose. Patient did not experience any benefit from, only GI S/E  Changed to DexCom G7 from Sunburst 3 per pt request as Josephine Igo keeps falling off, pt unable to afford skin tac  Stopped Toujeo 78 units qam and Humalog 26 units tidac 15 min before meals  No known contraindications/side effects to any of above medications Glucagon discussed and prescribed with refills on 11/24/23  -Last LD and Tg are as follows: Lab Results  Component Value Date   LDLCALC 146 (H) 11/17/2023    Lab Results  Component Value Date   TRIG 163 (H) 11/17/2023   -On atorvastatin 40 mg every day, compliant now, repeat labs in 02/2023 -Follow low fat diet and exercise   -Blood pressure goal <140/90 - Microalbumin/creatinine goal is < 30 -Last MA/Cr is as follows: Lab Results  Component Value Date   MICROALBUR 0.6 11/17/2023   -not on ACE/ARB  -diet changes including salt restriction -limit eating outside -counseled BP targets per standards of diabetes care -uncontrolled blood pressure can lead to retinopathy, nephropathy and cardiovascular and atherosclerotic heart disease  Reviewed and counseled on: -A1C target -Blood sugar targets -Complications of uncontrolled diabetes  -Checking blood sugar before meals and bedtime  and bring log next visit -All medications with mechanism of action and side effects -Hypoglycemia management: rule of 15's, Glucagon Emergency Kit and medical alert ID -low-carb low-fat plate-method diet -At least 20 minutes of physical activity  per day -Annual dilated retinal eye exam and foot exam -compliance and follow up needs -follow up as scheduled or earlier if problem gets worse  Call if blood sugar is less than 70 or consistently above 250    Take a 15 gm snack of carbohydrate at bedtime before you go to sleep if your blood sugar is less than 100.    If you are going to fast after midnight for a test or procedure, ask your physician for instructions on how to reduce/decrease your insulin dose.    Call if blood sugar is less than 70 or consistently above 250  -Treating a low sugar by rule of 15  (15 gms of sugar every 15 min until sugar is more than 70) If you feel your sugar is low, test your sugar to be sure If your sugar is low (less than 70), then take 15 grams of a fast acting Carbohydrate (3-4 glucose tablets or glucose gel or 4 ounces of juice or regular soda) Recheck your sugar 15 min after treating low to make sure it is more than 70 If sugar is still less than 70, treat again with 15 grams of carbohydrate          Don't drive the hour of hypoglycemia  If unconscious/unable to eat or drink by mouth, use glucagon injection or nasal spray baqsimi and call 911. Can repeat again in 15 min if still unconscious.  Return in about 2 weeks (around 01/01/2024) for tele-visit: 3 pm.   I have reviewed current medications, nurse's notes, allergies, vital signs, past medical and surgical history, family medical history, and social history for this encounter. Counseled patient on symptoms, examination findings, lab findings, imaging results, treatment decisions and monitoring and prognosis. The patient understood the recommendations and agrees with the treatment plan. All questions regarding treatment plan were fully answered.  Altamese [place], MD  12/18/23    History of Present Illness Whitney Benton is a 52 y.o. year old female who presents for follow up of Type I diabetes mellitus.  YANISHA Benton was first diagnosed in  2020.   Diabetes education +  Home diabetes regimen: U500 60 units before break fast, 40 units before lunch and 40 units before supper, 15 min before meals  Previously on, Toujeo 72 units qam Humalog 24 units tidac 15 min before meals Metformin XR 500 mg bid - gets diarrhea   COMPLICATIONS -  MI/Stroke -  retinopathy -  neuropathy -  nephropathy  BLOOD SUGAR DATA  CGM interpretation: At today's visit, we reviewed her CGM downloads. The full report is scanned in the media. Reviewing the CGM trends, BG are elevated after dinner-overnight.   Physical Exam  There were no vitals taken for this visit.   Constitutional: well developed, well nourished Head: normocephalic, atraumatic Eyes: sclera anicteric, no redness Neck: supple Lungs: normal respiratory effort Neurology: alert and oriented Skin: dry, no appreciable rashes Musculoskeletal: no appreciable defects Psychiatric: normal mood and affect Diabetic Foot Exam - Simple   No data filed      Current Medications Patient's Medications  New Prescriptions   No medications on file  Previous Medications   ACCU-CHEK SOFTCLIX LANCETS LANCETS    Use to check blood sugar 3 times daily  ALBUTEROL (VENTOLIN HFA) 108 (90 BASE) MCG/ACT INHALER    Inhale 2 puffs into the lungs every 6 (six) hours as needed for wheezing or shortness of breath (Cough).   AMLODIPINE (NORVASC) 10 MG TABLET    Take 1 tablet (10 mg total) by mouth daily.   ATORVASTATIN (LIPITOR) 40 MG TABLET    Take 1 tablet (40 mg total) by mouth daily.   BLOOD GLUCOSE MONITORING SUPPL (ACCU-CHEK GUIDE) W/DEVICE KIT    Use to check blood sugar 3 times daily.   CONTINUOUS GLUCOSE SENSOR (DEXCOM G7 SENSOR) MISC    Change sensor every 10 days.   CYCLOSPORINE (RESTASIS) 0.05 % OPHTHALMIC EMULSION    Apply 1 drop into both eyes twice a day   DULOXETINE (CYMBALTA) 60 MG CAPSULE    Take 1 capsule (60 mg total) by mouth daily.   ERENUMAB-AOOE (AIMOVIG) 140 MG/ML SOAJ    Inject  140 mg into the skin every 28 (twenty-eight) days.   GABAPENTIN (NEURONTIN) 300 MG CAPSULE    Take 1 capsule (300 mg total) by mouth 3 (three) times daily.   GLUCAGON (BAQSIMI ONE PACK) 3 MG/DOSE POWD    Place 1 Device into the nose as needed (Low blood sugar with impaired consciousness).   GLUCOSE BLOOD (ACCU-CHEK GUIDE) TEST STRIP    Use to check blood sugar 3 times daily   HYDROXYZINE (ATARAX) 50 MG TABLET    Take 1 tablet (50 mg total) by mouth 3 (three) times daily.   INSULIN PEN NEEDLE (PEN NEEDLES 3/16") 31G X 5 MM MISC    Use as directed with insulin pen   LYBALVI 15-10 MG TABS    Take 1 tablet by mouth daily.   MELOXICAM (MOBIC) 15 MG TABLET    Take 1 tablet (15 mg total) by mouth daily for 21 days.   METOCLOPRAMIDE (REGLAN) 10 MG TABLET    Take 1 tablet (10 mg total) by mouth 3 (three) times daily before meals.   METOPROLOL TARTRATE (LOPRESSOR) 50 MG TABLET    Take 1 tablet (50 mg total) by mouth 2 (two) times daily.   METRONIDAZOLE (FLAGYL) 500 MG TABLET    Take 1 tablet (500 mg total) by mouth 2 (two) times daily.   MULTIPLE VITAMINS-MINERALS (EYE VITAMINS PO)    Take 1 tablet by mouth daily.   OMEPRAZOLE (PRILOSEC) 40 MG CAPSULE    TAKE 1 CAPSULE BY MOUTH ONCE DAILY 30 MINUTES BEFORE BREAKFAST   ONDANSETRON (ZOFRAN-ODT) 4 MG DISINTEGRATING TABLET    Take 1 tablet (4 mg total) by mouth every 8 (eight) hours as needed.   SUMATRIPTAN (IMITREX) 100 MG TABLET    TAKE 1 TABLET EARLIEST ONSET OF MIGRAINE. MAY REPEAT IN 2 HOURS IF HEADACHE PERSISTS OR RECURS. MAXIMUM 2 TABLETS IN 24 HOURS.   TERCONAZOLE (TERAZOL 7) 0.4 % VAGINAL CREAM    Place 1 applicator vaginally at bedtime.   TRAZODONE (DESYREL) 50 MG TABLET    Take 1-2 tablets (50-100 mg total) by mouth at bedtime as needed for sleep.   TRIAMCINOLONE CREAM (KENALOG) 0.1 %    Apply 1 Application topically 2 (two) times daily.   VITAMIN D, CHOLECALCIFEROL, 10 MCG (400 UNIT) CAPS    Take 400 Int'l Units/day by mouth daily.  Modified  Medications   Modified Medication Previous Medication   INSULIN REGULAR HUMAN CONCENTRATED (HUMULIN R U-500 KWIKPEN) 500 UNIT/ML KWIKPEN insulin regular human CONCENTRATED (HUMULIN R U-500 KWIKPEN) 500 UNIT/ML KwikPen      60 units before  break fast, 40 units before lunch and 50 units before supper - 30 min before meals    70 units before break fast, 40 units before lunch and 30 units before supper - 15 min before meals  Discontinued Medications   No medications on file    Allergies Allergies  Allergen Reactions   Aspirin Hives   Trulicity [Dulaglutide] Other (See Comments)    DC'd due to chronic gastric and abdominal pain with nausea   Oxycodone Nausea And Vomiting    Past Medical History Past Medical History:  Diagnosis Date   Allergy    Anxiety    Arthritis    Asthma    Depression    Diabetes mellitus without complication (HCC)    GERD (gastroesophageal reflux disease)    Glaucoma suspect    Hyperlipidemia    Trichomonas infection     Past Surgical History Past Surgical History:  Procedure Laterality Date   CESAREAN SECTION     UPPER GASTROINTESTINAL ENDOSCOPY     VAGINAL HYSTERECTOMY  12/23/2004   Fibroids, menorrhagia, benign pathology    Family History family history includes Colon cancer in her paternal grandmother; Colon cancer (age of onset: 91) in her father; Depression in her mother; Diabetes in her father and mother; Hypertension in her father and mother; Mental illness in her mother.  Social History Social History   Socioeconomic History   Marital status: Significant Other    Spouse name: Not on file   Number of children: 3   Years of education: 14   Highest education level: Not on file  Occupational History   Not on file  Tobacco Use   Smoking status: Former    Current packs/day: 0.00    Average packs/day: 0.3 packs/day for 8.0 years (2.0 ttl pk-yrs)    Types: Cigarettes    Start date: 2013    Quit date: 2021    Years since quitting: 3.9    Smokeless tobacco: Never  Vaping Use   Vaping status: Never Used  Substance and Sexual Activity   Alcohol use: Not Currently    Comment: occasional   Drug use: Yes    Types: Marijuana    Comment: occ   Sexual activity: Yes    Birth control/protection: Surgical  Other Topics Concern   Not on file  Social History Narrative   Patient lives at home with mother and father , one story    Patient has 3 children    Patient is single   Patient has 14 years of education    Patient is right handed    Caffeine none   Social Drivers of Corporate investment banker Strain: Not on file  Food Insecurity: Food Insecurity Present (06/20/2023)   Hunger Vital Sign    Worried About Running Out of Food in the Last Year: Sometimes true    Ran Out of Food in the Last Year: Sometimes true  Transportation Needs: No Transportation Needs (06/20/2023)   PRAPARE - Administrator, Civil Service (Medical): No    Lack of Transportation (Non-Medical): No  Physical Activity: Not on file  Stress: Not on file  Social Connections: Unknown (03/07/2023)   Received [facility]   Social Network    Social Network: Not on file  Intimate Partner Violence: Not At Risk (06/20/2023)   Humiliation, Afraid, Rape, and Kick questionnaire    Fear of Current or Ex-Partner: No    Emotionally Abused: No    Physically Abused:  No    Sexually Abused: No    Lab Results  Component Value Date   HGBA1C 9.9 (H) 11/17/2023   HGBA1C 10.5 (A) 09/08/2023   HGBA1C 13.0 (H) 04/30/2023   Lab Results  Component Value Date   CHOL 224 (H) 11/17/2023   Lab Results  Component Value Date   HDL 48 (L) 11/17/2023   Lab Results  Component Value Date   LDLCALC 146 (H) 11/17/2023   Lab Results  Component Value Date   TRIG 163 (H) 11/17/2023   Lab Results  Component Value Date   CHOLHDL 4.7 11/17/2023   Lab Results  Component Value Date   CREATININE 0.90 11/17/2023   Lab Results  Component  Value Date   GFR 78.96 10/09/2022   Lab Results  Component Value Date   MICROALBUR 0.6 11/17/2023      Component Value Date/Time   NA 141 11/17/2023 1047   NA 140 12/12/2022 1036   K 4.1 11/17/2023 1047   CL 101 11/17/2023 1047   CO2 29 11/17/2023 1047   GLUCOSE 203 (H) 11/17/2023 1047   BUN 11 11/17/2023 1047   BUN 10 12/12/2022 1036   CREATININE 0.90 11/17/2023 1047   CALCIUM 10.1 11/17/2023 1047   PROT 7.5 11/17/2023 1047   PROT 7.0 11/30/2021 1039   ALBUMIN 4.4 11/12/2023 1130   ALBUMIN 4.4 11/30/2021 1039   AST 17 11/17/2023 1047   ALT 15 11/17/2023 1047   ALKPHOS 112 11/12/2023 1130   BILITOT 0.4 11/17/2023 1047   BILITOT 0.3 11/30/2021 1039   GFRNONAA >60 11/12/2023 1130   GFRAA >60 08/10/2020 2100      Latest Ref Rng & Units 11/17/2023   10:47 AM 11/12/2023   11:30 AM 09/20/2023    4:02 PM  BMP  Glucose 65 - 99 mg/dL 295  284  132   BUN 7 - 25 mg/dL 11  13  9    Creatinine 0.50 - 1.03 mg/dL 4.40  1.02  7.25   BUN/Creat Ratio 6 - 22 (calc) SEE NOTE:     Sodium 135 - 146 mmol/L 141  138  140   Potassium 3.5 - 5.3 mmol/L 4.1  4.1  2.9   Chloride 98 - 110 mmol/L 101  101  99   CO2 20 - 32 mmol/L 29  25  30    Calcium 8.6 - 10.4 mg/dL 36.6  9.7  9.4        Component Value Date/Time   WBC 10.6 (H) 11/12/2023 1130   RBC 5.20 (H) 11/12/2023 1130   HGB 14.1 11/12/2023 1130   HGB 13.3 12/12/2022 1036   HCT 44.5 11/12/2023 1130   HCT 40.4 12/12/2022 1036   PLT 495 (H) 11/12/2023 1130   PLT 436 12/12/2022 1036   MCV 85.6 11/12/2023 1130   MCV 86 12/12/2022 1036   MCH 27.1 11/12/2023 1130   MCHC 31.7 11/12/2023 1130   RDW 14.6 11/12/2023 1130   RDW 12.9 12/12/2022 1036   LYMPHSABS 1.1 06/20/2023 1122   LYMPHSABS 2.4 07/14/2019 1058   MONOABS 0.6 06/20/2023 1122   EOSABS 0.0 06/20/2023 1122   EOSABS 0.1 07/14/2019 1058   BASOSABS 0.0 06/20/2023 1122   BASOSABS 0.0 07/14/2019 1058     Parts of this note may have been dictated using voice recognition  software. There may be variances in spelling and vocabulary which are unintentional. Not all errors are proofread. Please notify the Thereasa Parkin if any discrepancies are noted or if the meaning of any  statement is not clear.

## 2023-12-22 ENCOUNTER — Other Ambulatory Visit: Payer: Self-pay

## 2023-12-22 ENCOUNTER — Emergency Department (HOSPITAL_COMMUNITY): Payer: Medicaid Other

## 2023-12-22 ENCOUNTER — Inpatient Hospital Stay (HOSPITAL_COMMUNITY)
Admission: EM | Admit: 2023-12-22 | Discharge: 2023-12-24 | DRG: 074 | Disposition: A | Discharge status: Home / Self Care | Payer: Medicaid Other | Attending: Student | Admitting: Student

## 2023-12-22 DIAGNOSIS — Z885 Allergy status to narcotic agent status: Secondary | ICD-10-CM

## 2023-12-22 DIAGNOSIS — E1043 Type 1 diabetes mellitus with diabetic autonomic (poly)neuropathy: Principal | ICD-10-CM | POA: Diagnosis present

## 2023-12-22 DIAGNOSIS — Z818 Family history of other mental and behavioral disorders: Secondary | ICD-10-CM

## 2023-12-22 DIAGNOSIS — Z90710 Acquired absence of both cervix and uterus: Secondary | ICD-10-CM

## 2023-12-22 DIAGNOSIS — E66813 Obesity, class 3: Secondary | ICD-10-CM | POA: Diagnosis present

## 2023-12-22 DIAGNOSIS — K760 Fatty (change of) liver, not elsewhere classified: Secondary | ICD-10-CM | POA: Diagnosis present

## 2023-12-22 DIAGNOSIS — N289 Disorder of kidney and ureter, unspecified: Secondary | ICD-10-CM | POA: Diagnosis present

## 2023-12-22 DIAGNOSIS — R109 Unspecified abdominal pain: Secondary | ICD-10-CM | POA: Diagnosis not present

## 2023-12-22 DIAGNOSIS — N3289 Other specified disorders of bladder: Secondary | ICD-10-CM | POA: Diagnosis not present

## 2023-12-22 DIAGNOSIS — E876 Hypokalemia: Secondary | ICD-10-CM | POA: Diagnosis present

## 2023-12-22 DIAGNOSIS — Z888 Allergy status to other drugs, medicaments and biological substances status: Secondary | ICD-10-CM

## 2023-12-22 DIAGNOSIS — E785 Hyperlipidemia, unspecified: Secondary | ICD-10-CM | POA: Diagnosis present

## 2023-12-22 DIAGNOSIS — R1084 Generalized abdominal pain: Secondary | ICD-10-CM

## 2023-12-22 DIAGNOSIS — E860 Dehydration: Secondary | ICD-10-CM | POA: Diagnosis present

## 2023-12-22 DIAGNOSIS — Z794 Long term (current) use of insulin: Secondary | ICD-10-CM

## 2023-12-22 DIAGNOSIS — R112 Nausea with vomiting, unspecified: Principal | ICD-10-CM | POA: Diagnosis present

## 2023-12-22 DIAGNOSIS — E1065 Type 1 diabetes mellitus with hyperglycemia: Secondary | ICD-10-CM | POA: Diagnosis present

## 2023-12-22 DIAGNOSIS — N200 Calculus of kidney: Secondary | ICD-10-CM | POA: Diagnosis not present

## 2023-12-22 DIAGNOSIS — G4700 Insomnia, unspecified: Secondary | ICD-10-CM | POA: Diagnosis present

## 2023-12-22 DIAGNOSIS — Z833 Family history of diabetes mellitus: Secondary | ICD-10-CM

## 2023-12-22 DIAGNOSIS — R000 Tachycardia, unspecified: Secondary | ICD-10-CM | POA: Diagnosis not present

## 2023-12-22 DIAGNOSIS — Z6841 Body Mass Index (BMI) 40.0 and over, adult: Secondary | ICD-10-CM

## 2023-12-22 DIAGNOSIS — K219 Gastro-esophageal reflux disease without esophagitis: Secondary | ICD-10-CM | POA: Diagnosis present

## 2023-12-22 DIAGNOSIS — Z886 Allergy status to analgesic agent status: Secondary | ICD-10-CM

## 2023-12-22 DIAGNOSIS — N951 Menopausal and female climacteric states: Secondary | ICD-10-CM | POA: Diagnosis present

## 2023-12-22 DIAGNOSIS — K3184 Gastroparesis: Secondary | ICD-10-CM | POA: Diagnosis not present

## 2023-12-22 DIAGNOSIS — F32A Depression, unspecified: Secondary | ICD-10-CM | POA: Diagnosis present

## 2023-12-22 DIAGNOSIS — Z8249 Family history of ischemic heart disease and other diseases of the circulatory system: Secondary | ICD-10-CM

## 2023-12-22 DIAGNOSIS — Z87891 Personal history of nicotine dependence: Secondary | ICD-10-CM

## 2023-12-22 DIAGNOSIS — Z800 Family history of malignant neoplasm of digestive organs: Secondary | ICD-10-CM

## 2023-12-22 DIAGNOSIS — I2510 Atherosclerotic heart disease of native coronary artery without angina pectoris: Secondary | ICD-10-CM | POA: Diagnosis present

## 2023-12-22 DIAGNOSIS — I10 Essential (primary) hypertension: Secondary | ICD-10-CM | POA: Diagnosis present

## 2023-12-22 DIAGNOSIS — E1143 Type 2 diabetes mellitus with diabetic autonomic (poly)neuropathy: Secondary | ICD-10-CM | POA: Diagnosis not present

## 2023-12-22 DIAGNOSIS — Z79899 Other long term (current) drug therapy: Secondary | ICD-10-CM

## 2023-12-22 DIAGNOSIS — F419 Anxiety disorder, unspecified: Secondary | ICD-10-CM | POA: Diagnosis present

## 2023-12-22 LAB — BASIC METABOLIC PANEL
Anion gap: 14 (ref 5–15)
BUN: 16 mg/dL (ref 6–20)
CO2: 23 mmol/L (ref 22–32)
Calcium: 10.1 mg/dL (ref 8.9–10.3)
Chloride: 99 mmol/L (ref 98–111)
Creatinine, Ser: 1.16 mg/dL — ABNORMAL HIGH (ref 0.44–1.00)
GFR, Estimated: 56 mL/min — ABNORMAL LOW (ref >60)
Glucose, Bld: 223 mg/dL — ABNORMAL HIGH (ref 70–99)
Potassium: 3.2 mmol/L — ABNORMAL LOW (ref 3.5–5.1)
Sodium: 136 mmol/L (ref 135–145)

## 2023-12-22 LAB — HEPATIC FUNCTION PANEL
ALT: 31 U/L (ref 0–44)
AST: 37 U/L (ref 15–41)
Albumin: 4.9 g/dL (ref 3.5–5.0)
Alkaline Phosphatase: 119 U/L (ref 38–126)
Bilirubin, Direct: 0.1 mg/dL (ref 0.0–0.2)
Indirect Bilirubin: 0.6 mg/dL (ref 0.3–0.9)
Total Bilirubin: 0.7 mg/dL (ref 0.0–1.2)
Total Protein: 9.5 g/dL — ABNORMAL HIGH (ref 6.5–8.1)

## 2023-12-22 LAB — URINALYSIS, ROUTINE W REFLEX MICROSCOPIC
Glucose, UA: 50 mg/dL — AB
Hgb urine dipstick: NEGATIVE
Ketones, ur: 5 mg/dL — AB
Leukocytes,Ua: NEGATIVE
Nitrite: NEGATIVE
Protein, ur: >=300 mg/dL — AB
Specific Gravity, Urine: 1.029 (ref 1.005–1.030)
pH: 5 (ref 5.0–8.0)

## 2023-12-22 LAB — CBC
HCT: 46.2 % — ABNORMAL HIGH (ref 36.0–46.0)
Hemoglobin: 15.3 g/dL — ABNORMAL HIGH (ref 12.0–15.0)
MCH: 27.6 pg (ref 26.0–34.0)
MCHC: 33.1 g/dL (ref 30.0–36.0)
MCV: 83.2 fL (ref 80.0–100.0)
Platelets: 582 10*3/uL — ABNORMAL HIGH (ref 150–400)
RBC: 5.55 MIL/uL — ABNORMAL HIGH (ref 3.87–5.11)
RDW: 14.4 % (ref 11.5–15.5)
WBC: 11.9 10*3/uL — ABNORMAL HIGH (ref 4.0–10.5)
nRBC: 0 % (ref 0.0–0.2)

## 2023-12-22 LAB — LIPASE, BLOOD: Lipase: 26 U/L (ref 11–51)

## 2023-12-22 LAB — CBG MONITORING, ED
Glucose-Capillary: 207 mg/dL — ABNORMAL HIGH (ref 70–99)
Glucose-Capillary: 211 mg/dL — ABNORMAL HIGH (ref 70–99)

## 2023-12-22 LAB — MAGNESIUM: Magnesium: 2.2 mg/dL (ref 1.7–2.4)

## 2023-12-22 MED ORDER — ONDANSETRON 4 MG PO TBDP: 4.0000 mg | ORAL_TABLET | Freq: Once | ORAL | Status: DC

## 2023-12-22 MED ORDER — SODIUM CHLORIDE 0.9 % IV BOLUS
1000.0000 mL | Freq: Once | INTRAVENOUS | Status: AC
  Administered 2023-12-23: 1000 mL via INTRAVENOUS

## 2023-12-22 MED ORDER — METOCLOPRAMIDE HCL 5 MG/ML IJ SOLN
10.0000 mg | Freq: Three times a day (TID) | INTRAMUSCULAR | Status: DC
  Administered 2023-12-23: 10 mg via INTRAVENOUS
  Filled 2023-12-22: qty 2

## 2023-12-22 MED ORDER — METOPROLOL TARTRATE 25 MG PO TABS
12.5000 mg | ORAL_TABLET | Freq: Two times a day (BID) | ORAL | Status: DC
  Administered 2023-12-23 – 2023-12-24 (×4): 12.5 mg via ORAL
  Filled 2023-12-22 (×4): qty 1

## 2023-12-22 MED ORDER — ONDANSETRON HCL 4 MG/2ML IJ SOLN: 4.0000 mg | Freq: Four times a day (QID) | INTRAMUSCULAR | Status: DC | PRN

## 2023-12-22 MED ORDER — ENOXAPARIN SODIUM 60 MG/0.6ML IJ SOSY
50.0000 mg | PREFILLED_SYRINGE | INTRAMUSCULAR | Status: DC
  Administered 2023-12-23 – 2023-12-24 (×2): 50 mg via SUBCUTANEOUS
  Filled 2023-12-22 (×3): qty 0.6

## 2023-12-22 MED ORDER — INSULIN REGULAR HUMAN (CONC) 500 UNIT/ML ~~LOC~~ SOPN: 50.0000 [IU] | PEN_INJECTOR | Freq: Every day | SUBCUTANEOUS | Status: DC

## 2023-12-22 MED ORDER — POTASSIUM CHLORIDE CRYS ER 20 MEQ PO TBCR
40.0000 meq | EXTENDED_RELEASE_TABLET | Freq: Once | ORAL | Status: AC
  Administered 2023-12-22: 40 meq via ORAL
  Filled 2023-12-22: qty 2

## 2023-12-22 MED ORDER — BUSPIRONE HCL 5 MG PO TABS
15.0000 mg | ORAL_TABLET | Freq: Three times a day (TID) | ORAL | Status: DC
  Administered 2023-12-23 – 2023-12-24 (×5): 15 mg via ORAL
  Filled 2023-12-22 (×2): qty 1
  Filled 2023-12-22: qty 2
  Filled 2023-12-22 (×2): qty 1

## 2023-12-22 MED ORDER — INSULIN REGULAR HUMAN (CONC) 500 UNIT/ML ~~LOC~~ SOPN: 60.0000 [IU] | PEN_INJECTOR | Freq: Every day | SUBCUTANEOUS | Status: DC

## 2023-12-22 MED ORDER — SODIUM CHLORIDE 0.9 % IV BOLUS: 1000.0000 mL | Freq: Once | INTRAVENOUS | Status: DC

## 2023-12-22 MED ORDER — SODIUM CHLORIDE 0.9% FLUSH
3.0000 mL | Freq: Two times a day (BID) | INTRAVENOUS | Status: DC
  Administered 2023-12-23: 3 mL via INTRAVENOUS

## 2023-12-22 MED ORDER — AMLODIPINE BESYLATE 10 MG PO TABS
10.0000 mg | ORAL_TABLET | Freq: Every day | ORAL | Status: DC
  Administered 2023-12-23 – 2023-12-24 (×2): 10 mg via ORAL
  Filled 2023-12-22 (×2): qty 1

## 2023-12-22 MED ORDER — DULOXETINE HCL 60 MG PO CPEP
60.0000 mg | ORAL_CAPSULE | Freq: Every day | ORAL | Status: DC
  Administered 2023-12-23 – 2023-12-24 (×2): 60 mg via ORAL
  Filled 2023-12-22 (×2): qty 1

## 2023-12-22 MED ORDER — SODIUM CHLORIDE 0.9 % IV SOLN: INTRAVENOUS | Status: DC

## 2023-12-22 MED ORDER — ALBUTEROL SULFATE HFA 108 (90 BASE) MCG/ACT IN AERS
2.0000 | INHALATION_SPRAY | Freq: Four times a day (QID) | RESPIRATORY_TRACT | Status: DC | PRN
Start: 2023-12-22 — End: 2023-12-23

## 2023-12-22 MED ORDER — INSULIN ASPART 100 UNIT/ML IJ SOLN
0.0000 [IU] | Freq: Three times a day (TID) | INTRAMUSCULAR | Status: DC
Start: 2023-12-23 — End: 2023-12-23
  Filled 2023-12-22: qty 0.15

## 2023-12-22 MED ORDER — ACETAMINOPHEN 650 MG RE SUPP: 650.0000 mg | Freq: Four times a day (QID) | RECTAL | Status: DC | PRN

## 2023-12-22 MED ORDER — ONDANSETRON HCL 4 MG PO TABS: 4.0000 mg | ORAL_TABLET | Freq: Four times a day (QID) | ORAL | Status: DC | PRN

## 2023-12-22 MED ORDER — INSULIN ASPART 100 UNIT/ML IJ SOLN
0.0000 [IU] | Freq: Every day | INTRAMUSCULAR | Status: DC
  Administered 2023-12-23: 3 [IU] via SUBCUTANEOUS
  Filled 2023-12-22: qty 0.05

## 2023-12-22 MED ORDER — IOHEXOL 300 MG/ML  SOLN
100.0000 mL | Freq: Once | INTRAMUSCULAR | Status: AC | PRN
  Administered 2023-12-22: 100 mL via INTRAVENOUS

## 2023-12-22 MED ORDER — TRAZODONE HCL 100 MG PO TABS: 50.0000 mg | ORAL_TABLET | Freq: Every evening | ORAL | Status: DC | PRN

## 2023-12-22 MED ORDER — GABAPENTIN 300 MG PO CAPS
300.0000 mg | ORAL_CAPSULE | Freq: Three times a day (TID) | ORAL | Status: DC
  Administered 2023-12-23 – 2023-12-24 (×5): 300 mg via ORAL
  Filled 2023-12-22 (×5): qty 1

## 2023-12-22 MED ORDER — INSULIN REGULAR HUMAN (CONC) 500 UNIT/ML ~~LOC~~ SOPN: 40.0000 [IU] | PEN_INJECTOR | Freq: Every day | SUBCUTANEOUS | Status: DC

## 2023-12-22 MED ORDER — OLANZAPINE 5 MG PO TABS
15.0000 mg | ORAL_TABLET | Freq: Every day | ORAL | Status: DC
  Administered 2023-12-23 (×2): 15 mg via ORAL
  Filled 2023-12-22: qty 1
  Filled 2023-12-22: qty 3

## 2023-12-22 MED ORDER — POTASSIUM CHLORIDE IN NACL 40-0.9 MEQ/L-% IV SOLN
INTRAVENOUS | Status: AC
Start: 2023-12-23 — End: 2023-12-23
  Filled 2023-12-22 (×3): qty 1000

## 2023-12-22 MED ORDER — METOPROLOL TARTRATE 25 MG PO TABS: 50.0000 mg | ORAL_TABLET | Freq: Two times a day (BID) | ORAL | Status: DC

## 2023-12-22 MED ORDER — SODIUM CHLORIDE 0.9 % IV BOLUS
1000.0000 mL | Freq: Once | INTRAVENOUS | Status: AC
  Administered 2023-12-22: 1000 mL via INTRAVENOUS

## 2023-12-22 MED ORDER — ACETAMINOPHEN 325 MG PO TABS
650.0000 mg | ORAL_TABLET | Freq: Four times a day (QID) | ORAL | Status: DC | PRN
  Administered 2023-12-23: 650 mg via ORAL
  Filled 2023-12-22: qty 2

## 2023-12-22 MED ORDER — DULOXETINE HCL 20 MG PO CPEP
20.0000 mg | ORAL_CAPSULE | Freq: Every morning | ORAL | Status: DC
  Administered 2023-12-23 – 2023-12-24 (×2): 20 mg via ORAL
  Filled 2023-12-22 (×2): qty 1

## 2023-12-22 MED ORDER — MECLIZINE HCL 25 MG PO TABS
25.0000 mg | ORAL_TABLET | Freq: Once | ORAL | Status: AC
  Administered 2023-12-22: 25 mg via ORAL
  Filled 2023-12-22: qty 1

## 2023-12-22 MED ORDER — METOCLOPRAMIDE HCL 5 MG/ML IJ SOLN
10.0000 mg | Freq: Once | INTRAMUSCULAR | Status: AC
Start: 2023-12-22 — End: 2023-12-22
  Administered 2023-12-22: 10 mg via INTRAVENOUS
  Filled 2023-12-22: qty 2

## 2023-12-22 NOTE — ED Provider Notes (Cosign Needed)
[place] EMERGENCY DEPARTMENT AT [facility] Provider Note   CSN: [id number] Arrival date & time: 12/22/23  1601     History  Chief Complaint  Patient presents with   Nausea   Dizziness    Whitney Benton is a 52 y.o. female.  With a history of type 1 diabetes, anxiety, depression, GERD, hyperlipidemia, arthritis, asthma presenting to the ED for evaluation of abdominal pain, nausea, vomiting and dizziness.  Symptoms began 2 days ago and have progressively gotten worse.  She reports some anorexia as well and states that she has not had anything to eat in 2 days.  States it feels different than her episodes of gastroparesis.  She reports dizziness and lightheadedness when changing positions.  She denies any melena, hematochezia, hematemesis.  She denies any diarrhea.  No chest pain or shortness of breath.  She reports hot flashes but denies any fevers.  She reports compliance with her home medications   Dizziness Associated symptoms: nausea and vomiting        Home Medications Prior to Admission medications   Medication Sig Start Date End Date Taking? Authorizing Provider  Accu-Chek Softclix Lancets lancets Use to check blood sugar 3 times daily 08/11/23   Marcine Matar, MD  albuterol (VENTOLIN HFA) 108 (90 Base) MCG/ACT inhaler Inhale 2 puffs into the lungs every 6 (six) hours as needed for wheezing or shortness of breath (Cough). 02/26/23   Marcine Matar, MD  amLODipine (NORVASC) 10 MG tablet Take 1 tablet (10 mg total) by mouth daily. 05/07/23   Anders Simmonds, PA-C  atorvastatin (LIPITOR) 40 MG tablet Take 1 tablet (40 mg total) by mouth daily. 05/07/23   Anders Simmonds, PA-C  Blood Glucose Monitoring Suppl (ACCU-CHEK GUIDE) w/Device KIT Use to check blood sugar 3 times daily. 08/11/23   Marcine Matar, MD  Continuous Glucose Sensor (DEXCOM G7 SENSOR) MISC Change sensor every 10 days. 12/02/23   Altamese Monument, MD  cycloSPORINE (RESTASIS) 0.05 %  ophthalmic emulsion Apply 1 drop into both eyes twice a day 09/19/21     DULoxetine (CYMBALTA) 60 MG capsule Take 1 capsule (60 mg total) by mouth daily. 11/18/23   Shanna Cisco, NP  Erenumab-aooe (AIMOVIG) 140 MG/ML SOAJ Inject 140 mg into the skin every 28 (twenty-eight) days. 09/11/23   Drema Dallas, DO  gabapentin (NEURONTIN) 300 MG capsule Take 1 capsule (300 mg total) by mouth 3 (three) times daily. 11/18/23   Shanna Cisco, NP  Glucagon (BAQSIMI ONE PACK) 3 MG/DOSE POWD Place 1 Device into the nose as needed (Low blood sugar with impaired consciousness). 11/24/23   Altamese , MD  glucose blood (ACCU-CHEK GUIDE) test strip Use to check blood sugar 3 times daily 08/11/23   Marcine Matar, MD  hydrOXYzine (ATARAX) 50 MG tablet Take 1 tablet (50 mg total) by mouth 3 (three) times daily. 11/18/23   Shanna Cisco, NP  Insulin Pen Needle (PEN NEEDLES 3/16") 31G X 5 MM MISC Use as directed with insulin pen 05/07/23   Anders Simmonds, PA-C  insulin regular human CONCENTRATED (HUMULIN R U-500 KWIKPEN) 500 UNIT/ML KwikPen Inject 60 Units into the skin daily before breakfast AND 40 Units daily before lunch AND 50 Units daily before supper. Take 30 minutes before meals. 12/18/23   Motwani, Carin Hock, MD  LYBALVI 15-10 MG TABS Take 1 tablet by mouth daily. 11/18/23   Shanna Cisco, NP  metoCLOPramide (REGLAN) 10 MG tablet Take 1 tablet (  10 mg total) by mouth 3 (three) times daily before meals. 11/14/23   Hoy Register, MD  metoprolol tartrate (LOPRESSOR) 50 MG tablet Take 1 tablet (50 mg total) by mouth 2 (two) times daily. 05/07/23   Anders Simmonds, PA-C  metroNIDAZOLE (FLAGYL) 500 MG tablet Take 1 tablet (500 mg total) by mouth 2 (two) times daily. 08/12/23   Bernerd Limbo, CNM  Multiple Vitamins-Minerals (EYE VITAMINS PO) Take 1 tablet by mouth daily.    [provider]  omeprazole (PRILOSEC) 40 MG capsule TAKE 1 CAPSULE BY MOUTH ONCE DAILY 30 MINUTES BEFORE  BREAKFAST 03/10/23   Meredith Pel, NP  ondansetron (ZOFRAN-ODT) 4 MG disintegrating tablet Take 1 tablet (4 mg total) by mouth every 8 (eight) hours as needed. 11/12/23   Karie Mainland, Amjad, PA-C  SUMAtriptan (IMITREX) 100 MG tablet TAKE 1 TABLET EARLIEST ONSET OF MIGRAINE. MAY REPEAT IN 2 HOURS IF HEADACHE PERSISTS OR RECURS. MAXIMUM 2 TABLETS IN 24 HOURS. 03/11/23 03/10/24  Drema Dallas, DO  terconazole (TERAZOL 7) 0.4 % vaginal cream Place 1 applicator vaginally at bedtime. 08/12/23   Bernerd Limbo, CNM  traZODone (DESYREL) 50 MG tablet Take 1-2 tablets (50-100 mg total) by mouth at bedtime as needed for sleep. 11/18/23   Toy Cookey E, NP  triamcinolone cream (KENALOG) 0.1 % Apply 1 Application topically 2 (two) times daily. 08/12/22   Marcine Matar, MD  Vitamin D, Cholecalciferol, 10 MCG (400 UNIT) CAPS Take 400 Int'l Units/day by mouth daily. 09/10/23   Marcine Matar, MD  cetirizine (ZYRTEC) 10 MG tablet Take 1 tablet (10 mg total) by mouth daily. Patient not taking: No sig reported 03/31/19 03/03/20  Hoy Register, MD      Allergies    Aspirin, Trulicity [dulaglutide], and Oxycodone    Review of Systems   Review of Systems  Gastrointestinal:  Positive for abdominal pain, nausea and vomiting.  Neurological:  Positive for dizziness and light-headedness.    Physical Exam Updated Vital Signs BP (!) 147/91   Pulse (!) 122   Temp 99 F (37.2 C) (Oral)   Resp (!) 23   Ht 5\' 3"  (1.6 m)   Wt 102.5 kg   SpO2 99%   BMI 40.03 kg/m  Physical Exam Vitals and nursing note reviewed.  Constitutional:      General: She is not in acute distress.    Appearance: She is well-developed. She is obese. She is not ill-appearing, toxic-appearing or diaphoretic.     Comments: Resting comfortably in bed  HENT:     Head: Normocephalic and atraumatic.  Eyes:     Conjunctiva/sclera: Conjunctivae normal.  Cardiovascular:     Rate and Rhythm: Normal rate and regular rhythm.     Heart  sounds: No murmur heard. Pulmonary:     Effort: Pulmonary effort is normal. No respiratory distress.     Breath sounds: Normal breath sounds. No wheezing, rhonchi or rales.  Abdominal:     Palpations: Abdomen is soft.     Tenderness: There is abdominal tenderness (Generalized, mild). There is no guarding.  Musculoskeletal:        General: No swelling.     Cervical back: Neck supple.  Skin:    General: Skin is warm and dry.     Capillary Refill: Capillary refill takes less than 2 seconds.  Neurological:     Mental Status: She is alert.  Psychiatric:        Mood and Affect: Mood normal.  ED Results / Procedures / Treatments   Labs (all labs ordered are listed, but only abnormal results are displayed) Labs Reviewed  BASIC METABOLIC PANEL - Abnormal; Notable for the following components:      Result Value   Potassium 3.2 (*)    Glucose, Bld 223 (*)    Creatinine, Ser 1.16 (*)    GFR, Estimated 56 (*)    All other components within normal limits  CBC - Abnormal; Notable for the following components:   WBC 11.9 (*)    RBC 5.55 (*)    Hemoglobin 15.3 (*)    HCT 46.2 (*)    Platelets 582 (*)    All other components within normal limits  URINALYSIS, ROUTINE W REFLEX MICROSCOPIC - Abnormal; Notable for the following components:   Color, Urine AMBER (*)    APPearance TURBID (*)    Glucose, UA 50 (*)    Bilirubin Urine SMALL (*)    Ketones, ur 5 (*)    Protein, ur >=300 (*)    Bacteria, UA FEW (*)    All other components within normal limits  HEPATIC FUNCTION PANEL - Abnormal; Notable for the following components:   Total Protein 9.5 (*)    All other components within normal limits  CBG MONITORING, ED - Abnormal; Notable for the following components:   Glucose-Capillary 211 (*)    All other components within normal limits  CBG MONITORING, ED - Abnormal; Notable for the following components:   Glucose-Capillary 207 (*)    All other components within normal limits   MAGNESIUM  LIPASE, BLOOD    EKG EKG Interpretation Date/Time:  Monday December 22 2023 19:02:18 EST Ventricular Rate:  125 PR Interval:  131 QRS Duration:  89 QT Interval:  318 QTC Calculation: 459 R Axis:   -21  Text Interpretation: Sinus tachycardia LAE, consider biatrial enlargement Borderline left axis deviation Nonspecific T abnormalities, lateral leads Confirmed by Fulton Reek [id number]) on 12/22/2023 7:04:53 PM  Radiology CT Head Wo Contrast Result Date: 12/22/2023 CLINICAL DATA:  Central vertigo EXAM: CT HEAD WITHOUT CONTRAST TECHNIQUE: Contiguous axial images were obtained from the base of the skull through the vertex without intravenous contrast. RADIATION DOSE REDUCTION: This exam was performed according to the departmental dose-optimization program which includes automated exposure control, adjustment of the mA and/or kV according to patient size and/or use of iterative reconstruction technique. COMPARISON:  06/14/2020 FINDINGS: Brain: There is no mass, hemorrhage or extra-axial collection. The size and configuration of the ventricles and extra-axial CSF spaces are normal. There is hypoattenuation of the white matter, most commonly indicating chronic small vessel disease. Vascular: No hyperdense vessel or unexpected vascular calcification. Skull: The visualized skull base, calvarium and extracranial soft tissues are normal. Sinuses/Orbits: No fluid levels or advanced mucosal thickening of the visualized paranasal sinuses. No mastoid or middle ear effusion. Normal orbits. Other: None. IMPRESSION: 1. No acute intracranial abnormality. 2. Chronic small vessel disease. Electronically Signed   By: Deatra Robinson M.D.   On: 12/22/2023 21:58    Procedures Procedures    Medications Ordered in ED Medications  potassium chloride SA (KLOR-CON M) CR tablet 40 mEq (has no administration in time range)  meclizine (ANTIVERT) tablet 25 mg (25 mg Oral Given 12/22/23 1929)  sodium chloride  0.9 % bolus 1,000 mL (1,000 mLs Intravenous New Bag/Given 12/22/23 1929)  metoCLOPramide (REGLAN) injection 10 mg (10 mg Intravenous Given 12/22/23 1929)  iohexol (OMNIPAQUE) 300 MG/ML solution 100 mL (100 mLs Intravenous Contrast Given 12/22/23  2020)    ED Course/ Medical Decision Making/ A&P                                 Medical Decision Making Amount and/or Complexity of Data Reviewed Labs: ordered.  Risk Prescription drug management.  This patient presents to the ED for concern of abdominal pain, nausea, vomiting, this involves an extensive number of treatment options, and is a complaint that carries with it a high risk of complications and morbidity.  The emergent differential diagnosis for vomiting includes, but is not limited to ACS/MI, DKA, Ischemic bowel, Meningitis, Sepsis, Acute gastric dilation, Adrenal insufficiency, Appendicitis,  Bowel obstruction/ileus, Carbon monoxide poisoning, Cholecystitis, Electrolyte abnormalities, Elevated ICP, Gastric outlet obstruction, Pancreatitis, Ruptured viscus, Biliary colic, Cannabinoid hyperemesis syndrome, Gastritis, Gastroenteritis, Gastroparesis,  Narcotic withdrawal, Peptic ulcer disease, and UTI   My initial workup includes labs, imaging, symptom control  Additional history obtained from: Nursing notes from this visit.  I ordered, reviewed and interpreted labs which include: CBC, CMP, lipase, magnesium.  Leukocytosis of 11.9.  Hemoglobin elevated to 15.3.  Hypokalemia 3.2.  Hyperglycemia of 223 with a normal anion gap.  Creatinine mildly elevated to 1.16 with a GFR of 56.  Urine without evidence of infection.  I ordered imaging studies including CT head, CT abdomen pelvis I independently visualized and interpreted imaging which showed pending at shift change  52 year old female presenting for evaluation of abdominal pain, nausea, vomiting.  Symptoms have been present for the past 3 days.  States it feels different than her typical  gastroparesis.  Lab workup was reassuring.  Hypokalemia 3.2 likely secondary to GI loss.  Elevated creatinine suspicious for dehydration.  Patient was given IV fluids and Reglan in the emergency department.  Patient remained tachycardic to the 120s.  On chart review, patient is typically tachycardic between 95 and 120.  Plan at the time of shift changes to reassess after imaging and fluids are completed.  May likely discharge home if she tolerates oral intake with reassuring imaging. Care handed off to oncoming provider.  Please see their note for final disposition and decision making.  Plan may change at the discretion of the oncoming provider.  Note: Portions of this report may have been transcribed using voice recognition software. Every effort was made to ensure accuracy; however, inadvertent computerized transcription errors may still be present.         Final Clinical Impression(s) / ED Diagnoses Final diagnoses:  Nausea and vomiting, unspecified vomiting type  Generalized abdominal pain    Rx / DC Orders ED Discharge Orders     None         Michelle Piper, Cordelia Poche 12/22/23 2211

## 2023-12-22 NOTE — ED Triage Notes (Signed)
Patient to ED by POV with c/o nausea and dizziness x2 days. She denies any new meds or changes that may have caused symptoms. No OTC meds taken to help.

## 2023-12-22 NOTE — ED Provider Triage Note (Signed)
Emergency Medicine Provider Triage Evaluation Note  MONTSERRAT GRZESIK , a 52 y.o. female  was evaluated in triage.  Pt complains of abd pain. Mid abdominal pain with associate nausea and vomiting since yesterday.  Now having throbbing headache and dizziness.  No fever, chills, dysuria.  Hx of gastroparesis.    Review of Systems  Positive: As above Negative: As above  Physical Exam  BP (!) 146/108 (BP Location: Left Arm)   Pulse (!) 132   Temp 98.7 F (37.1 C) (Oral)   Resp 20   Ht 5\' 3"  (1.6 m)   Wt 102.5 kg   SpO2 100%   BMI 40.03 kg/m  Gen:   Awake, appears uncomfortable  Resp:  Normal effort, tachycardic MSK:   Moves extremities without difficulty  Other:  Abd ttp  Medical Decision Making  Medically screening exam initiated at 6:21 PM.  Appropriate orders placed.  JALIYHA PERRIS was informed that the remainder of the evaluation will be completed by another provider, this initial triage assessment does not replace that evaluation, and the importance of remaining in the ED until their evaluation is complete.  Pt is tachycardic, request next available room. EKG ordered.    Fayrene Helper, PA-C 12/22/23 [phone]

## 2023-12-23 DIAGNOSIS — E1043 Type 1 diabetes mellitus with diabetic autonomic (poly)neuropathy: Secondary | ICD-10-CM | POA: Diagnosis not present

## 2023-12-23 DIAGNOSIS — N289 Disorder of kidney and ureter, unspecified: Secondary | ICD-10-CM | POA: Diagnosis not present

## 2023-12-23 DIAGNOSIS — F419 Anxiety disorder, unspecified: Secondary | ICD-10-CM | POA: Diagnosis not present

## 2023-12-23 DIAGNOSIS — Z794 Long term (current) use of insulin: Secondary | ICD-10-CM | POA: Diagnosis not present

## 2023-12-23 DIAGNOSIS — I2510 Atherosclerotic heart disease of native coronary artery without angina pectoris: Secondary | ICD-10-CM | POA: Diagnosis not present

## 2023-12-23 DIAGNOSIS — E860 Dehydration: Secondary | ICD-10-CM | POA: Diagnosis not present

## 2023-12-23 DIAGNOSIS — E876 Hypokalemia: Secondary | ICD-10-CM | POA: Diagnosis not present

## 2023-12-23 DIAGNOSIS — R112 Nausea with vomiting, unspecified: Secondary | ICD-10-CM | POA: Diagnosis not present

## 2023-12-23 DIAGNOSIS — N3289 Other specified disorders of bladder: Secondary | ICD-10-CM | POA: Diagnosis not present

## 2023-12-23 DIAGNOSIS — Z79899 Other long term (current) drug therapy: Secondary | ICD-10-CM | POA: Diagnosis not present

## 2023-12-23 DIAGNOSIS — Z90710 Acquired absence of both cervix and uterus: Secondary | ICD-10-CM | POA: Diagnosis not present

## 2023-12-23 DIAGNOSIS — E785 Hyperlipidemia, unspecified: Secondary | ICD-10-CM | POA: Diagnosis not present

## 2023-12-23 DIAGNOSIS — R109 Unspecified abdominal pain: Secondary | ICD-10-CM | POA: Diagnosis not present

## 2023-12-23 DIAGNOSIS — K3184 Gastroparesis: Secondary | ICD-10-CM | POA: Diagnosis not present

## 2023-12-23 DIAGNOSIS — F32A Depression, unspecified: Secondary | ICD-10-CM | POA: Diagnosis not present

## 2023-12-23 DIAGNOSIS — R42 Dizziness and giddiness: Secondary | ICD-10-CM | POA: Diagnosis not present

## 2023-12-23 DIAGNOSIS — K760 Fatty (change of) liver, not elsewhere classified: Secondary | ICD-10-CM | POA: Diagnosis not present

## 2023-12-23 DIAGNOSIS — N200 Calculus of kidney: Secondary | ICD-10-CM | POA: Diagnosis not present

## 2023-12-23 DIAGNOSIS — G4700 Insomnia, unspecified: Secondary | ICD-10-CM | POA: Diagnosis not present

## 2023-12-23 DIAGNOSIS — N951 Menopausal and female climacteric states: Secondary | ICD-10-CM | POA: Diagnosis not present

## 2023-12-23 DIAGNOSIS — I10 Essential (primary) hypertension: Secondary | ICD-10-CM | POA: Diagnosis not present

## 2023-12-23 DIAGNOSIS — Z885 Allergy status to narcotic agent status: Secondary | ICD-10-CM | POA: Diagnosis not present

## 2023-12-23 DIAGNOSIS — Z886 Allergy status to analgesic agent status: Secondary | ICD-10-CM | POA: Diagnosis not present

## 2023-12-23 DIAGNOSIS — E1065 Type 1 diabetes mellitus with hyperglycemia: Secondary | ICD-10-CM | POA: Diagnosis not present

## 2023-12-23 DIAGNOSIS — R000 Tachycardia, unspecified: Secondary | ICD-10-CM | POA: Diagnosis not present

## 2023-12-23 DIAGNOSIS — Z6841 Body Mass Index (BMI) 40.0 and over, adult: Secondary | ICD-10-CM | POA: Diagnosis not present

## 2023-12-23 DIAGNOSIS — K219 Gastro-esophageal reflux disease without esophagitis: Secondary | ICD-10-CM | POA: Diagnosis not present

## 2023-12-23 DIAGNOSIS — Z87891 Personal history of nicotine dependence: Secondary | ICD-10-CM | POA: Diagnosis not present

## 2023-12-23 DIAGNOSIS — R1084 Generalized abdominal pain: Secondary | ICD-10-CM | POA: Diagnosis not present

## 2023-12-23 DIAGNOSIS — E66813 Obesity, class 3: Secondary | ICD-10-CM | POA: Diagnosis not present

## 2023-12-23 DIAGNOSIS — E1143 Type 2 diabetes mellitus with diabetic autonomic (poly)neuropathy: Secondary | ICD-10-CM | POA: Diagnosis not present

## 2023-12-23 LAB — CREATININE, SERUM
Creatinine, Ser: 1.12 mg/dL — ABNORMAL HIGH (ref 0.44–1.00)
GFR, Estimated: 59 mL/min — ABNORMAL LOW (ref >60)

## 2023-12-23 LAB — CBC
HCT: 40.1 % (ref 36.0–46.0)
HCT: 39 % (ref 36.0–46.0)
Hemoglobin: 13.1 g/dL (ref 12.0–15.0)
Hemoglobin: 12.2 g/dL (ref 12.0–15.0)
MCH: 27.5 pg (ref 26.0–34.0)
MCH: 26.9 pg (ref 26.0–34.0)
MCHC: 32.7 g/dL (ref 30.0–36.0)
MCHC: 31.3 g/dL (ref 30.0–36.0)
MCV: 84.1 fL (ref 80.0–100.0)
MCV: 86.1 fL (ref 80.0–100.0)
Platelets: 488 10*3/uL — ABNORMAL HIGH (ref 150–400)
Platelets: 420 10*3/uL — ABNORMAL HIGH (ref 150–400)
RBC: 4.77 MIL/uL (ref 3.87–5.11)
RBC: 4.53 MIL/uL (ref 3.87–5.11)
RDW: 14.2 % (ref 11.5–15.5)
RDW: 14.2 % (ref 11.5–15.5)
WBC: 11.6 10*3/uL — ABNORMAL HIGH (ref 4.0–10.5)
WBC: 10.5 10*3/uL (ref 4.0–10.5)
nRBC: 0 % (ref 0.0–0.2)
nRBC: 0 % (ref 0.0–0.2)

## 2023-12-23 LAB — BASIC METABOLIC PANEL
Anion gap: 11 (ref 5–15)
BUN: 12 mg/dL (ref 6–20)
CO2: 19 mmol/L — ABNORMAL LOW (ref 22–32)
Calcium: 8.4 mg/dL — ABNORMAL LOW (ref 8.9–10.3)
Chloride: 101 mmol/L (ref 98–111)
Creatinine, Ser: 0.94 mg/dL (ref 0.44–1.00)
GFR, Estimated: >60 mL/min (ref >60)
Glucose, Bld: 353 mg/dL — ABNORMAL HIGH (ref 70–99)
Potassium: 3.9 mmol/L (ref 3.5–5.1)
Sodium: 131 mmol/L — ABNORMAL LOW (ref 135–145)

## 2023-12-23 LAB — GLUCOSE, CAPILLARY
Glucose-Capillary: 387 mg/dL — ABNORMAL HIGH (ref 70–99)
Glucose-Capillary: 367 mg/dL — ABNORMAL HIGH (ref 70–99)
Glucose-Capillary: 180 mg/dL — ABNORMAL HIGH (ref 70–99)
Glucose-Capillary: 168 mg/dL — ABNORMAL HIGH (ref 70–99)

## 2023-12-23 LAB — HEMOGLOBIN A1C
Hgb A1c MFr Bld: 10.3 % — ABNORMAL HIGH (ref 4.8–5.6)
Mean Plasma Glucose: 249 mg/dL

## 2023-12-23 LAB — CBG MONITORING, ED: Glucose-Capillary: 283 mg/dL — ABNORMAL HIGH (ref 70–99)

## 2023-12-23 MED ORDER — INSULIN ASPART 100 UNIT/ML IJ SOLN
0.0000 [IU] | Freq: Three times a day (TID) | INTRAMUSCULAR | Status: DC
Start: 2023-12-23 — End: 2023-12-24
  Administered 2023-12-23 (×2): 20 [IU] via SUBCUTANEOUS
  Administered 2023-12-23: 4 [IU] via SUBCUTANEOUS
  Administered 2023-12-24: 7 [IU] via SUBCUTANEOUS

## 2023-12-23 MED ORDER — INSULIN REGULAR HUMAN (CONC) 500 UNIT/ML ~~LOC~~ SOPN
55.0000 [IU] | PEN_INJECTOR | Freq: Every day | SUBCUTANEOUS | Status: DC
  Administered 2023-12-23: 55 [IU] via SUBCUTANEOUS

## 2023-12-23 MED ORDER — ALBUTEROL SULFATE (2.5 MG/3ML) 0.083% IN NEBU: 2.5000 mg | INHALATION_SOLUTION | Freq: Four times a day (QID) | RESPIRATORY_TRACT | Status: DC | PRN

## 2023-12-23 MED ORDER — INSULIN REGULAR HUMAN (CONC) 500 UNIT/ML ~~LOC~~ SOPN
65.0000 [IU] | PEN_INJECTOR | Freq: Every day | SUBCUTANEOUS | Status: DC
  Administered 2023-12-24: 65 [IU] via SUBCUTANEOUS
  Filled 2023-12-23: qty 3

## 2023-12-23 MED ORDER — METOCLOPRAMIDE HCL 5 MG PO TABS
10.0000 mg | ORAL_TABLET | Freq: Three times a day (TID) | ORAL | Status: DC
  Administered 2023-12-23 – 2023-12-24 (×3): 10 mg via ORAL
  Filled 2023-12-23 (×4): qty 2

## 2023-12-23 MED ORDER — INSULIN REGULAR HUMAN (CONC) 500 UNIT/ML ~~LOC~~ SOPN
45.0000 [IU] | PEN_INJECTOR | Freq: Every day | SUBCUTANEOUS | Status: DC
  Administered 2023-12-23: 45 [IU] via SUBCUTANEOUS

## 2023-12-23 NOTE — Progress Notes (Signed)
 PROGRESS NOTE  Whitney Benton FMW:[id number] DOB: 29-May-1971   PCP: [name], MD  Patient is from: Home  DOA: 12/22/2023 LOS: 0  Chief complaints Chief Complaint  Patient presents with   Nausea   Dizziness     Brief Narrative / Interim history: 52 year old F with PMH of DM-1 with gastroparesis, CAD, HTN, HLD, morbid obesity, headache, tobacco use disorder, anxiety and depression presenting with progressive nausea, vomiting, abdominal pain and dizziness for 2 days, and admitted with intractable nausea, vomiting and pain due to diabetic gastroparesis.  CT head and CT abdomen and pelvis without acute finding but the later suggested hepatic steatosis.  Labs without significant finding other than some signs of hemoconcentration.  She was not in DKA or HHS.  Patient was started on IV fluid and scheduled Reglan , and admitted.  Subjective: Seen and examined earlier this morning.  Reports improvement in nausea and vomiting.  Admits to diffuse abdominal pain but has improved.  Tolerated soft diet.  Reports using her Reglan  consistently at home.  Denies marijuana use.  Objective: Vitals:   12/23/23 0230 12/23/23 0306 12/23/23 0400 12/23/23 0749  BP: (!) 137/95 (!) 142/91 129/87 139/79  Pulse: 89 86 91 97  Resp:  18  16  Temp:  98.2 F (36.8 C)  98.6 F (37 C)  TempSrc:  Oral  Oral  SpO2: 96% 96% 95% 96%  Weight:      Height:        Examination:  GENERAL: No apparent distress.  Nontoxic. HEENT: MMM.  Vision and hearing grossly intact.  NECK: Supple.  No apparent JVD.  RESP:  No IWOB.  Fair aeration bilaterally. CVS:  RRR. Heart sounds normal.  ABD/GI/GU: BS+. Abd soft.  Mild diffuse tenderness.  No rebound or guarding. MSK/EXT:  Moves extremities. No apparent deformity. No edema.  SKIN: no apparent skin lesion or wound NEURO: Sleepy but wakes to voice.  Oriented x 4.  No apparent focal neuro deficit. PSYCH: Calm. Normal affect.   Procedures:  None  Microbiology  summarized: None  Assessment and plan: Intractable nausea vomiting likely secondary to diabetic gastroparesis: She reports consistent use of p.o. Reglan  at home.  Denies marijuana use.  CT abdomen and pelvis without acute finding.  Labs with some evidence of hemoconcentration.  Lipase within normal.  UA suggestive proteinuria.  She has no UTI symptoms.  -Continue IV fluids -Changed Reglan  to p.o. ACHS    Uncontrolled type 1 diabetes mellitus with hyperglycemia: A1c 9.9% on 11/25. Recent Labs  Lab 12/22/23 1835 12/22/23 1904 12/23/23 0040 12/23/23 0746  GLUCAP 211* 207* 283* 387*  -Increase mealtime concentrated insulin  by 5 units. -Increase SSI from moderate to resistant -Change diet to carb modified -Continue monitoring  Mild renal insufficiency: Baseline creatinine appears to be between 0.8-0.9.  Resolved.  Essential hypertension: Normotensive. -Continue home meds-amlodipine  and metoprolol .  Sinus tachycardia: Likely due to #1.  Resolved. -IV fluid and metoprolol  as above   Hypokalemia:  -Monitor replenish as appropriate   Anxiety/depression/insomnia: Stable -Continue home meds-Cymbalta , BuSpar , Zyprexa  and trazodone .  Hyperlipidemia -Continue home statin.  Hepatic steatosis: Denies drinking alcohol. -Encourage weight loss   Morbid obesity Body mass index is 40.03 kg/m. -Encourage lifestyle change to lose weight         DVT prophylaxis:  Subcu Lovenox   Code Status: Full code Family Communication: None at bedside Level of care: Progressive Status is: Observation The patient will require care spanning > 2 midnights and should be moved to inpatient  because: Intractable nausea and vomiting   Final disposition: Home Consultants:  None  55 minutes with more than 50% spent in reviewing records, counseling patient/family and coordinating care.   Sch Meds:  Scheduled Meds:  amLODipine   10 mg Oral Daily   busPIRone   15 mg Oral TID   DULoxetine   20 mg  Oral q morning   DULoxetine   60 mg Oral Daily   enoxaparin  (LOVENOX ) injection  50 mg Subcutaneous Q24H   gabapentin   300 mg Oral TID   insulin  aspart  0-20 Units Subcutaneous TID WC   insulin  aspart  0-5 Units Subcutaneous QHS   insulin  regular human CONCENTRATED  65 Units Subcutaneous QAC breakfast   And   insulin  regular human CONCENTRATED  45 Units Subcutaneous QAC lunch   And   insulin  regular human CONCENTRATED  55 Units Subcutaneous QAC supper   metoCLOPramide   10 mg Oral TID AC & HS   metoprolol  tartrate  12.5 mg Oral BID   OLANZapine   15 mg Oral QHS   sodium chloride  flush  3 mL Intravenous Q12H   Continuous Infusions:  0.9 % NaCl with KCl 40 mEq / L 125 mL/hr at 12/23/23 1119   PRN Meds:.acetaminophen  **OR** acetaminophen , albuterol , ondansetron  **OR** ondansetron  (ZOFRAN ) IV, traZODone   Antimicrobials: Anti-infectives (From admission, onward)    None        I have personally reviewed the following labs and images: CBC: Recent Labs  Lab 12/22/23 1835 12/23/23 0057 12/23/23 0548  WBC 11.9* 11.6* 10.5  HGB 15.3* 13.1 12.2  HCT 46.2* 40.1 39.0  MCV 83.2 84.1 86.1  PLT 582* 488* 420*   BMP &GFR Recent Labs  Lab 12/22/23 1835 12/22/23 1932 12/23/23 0057 12/23/23 0548  NA 136  --   --  131*  K 3.2*  --   --  3.9  CL 99  --   --  101  CO2 23  --   --  19*  GLUCOSE 223*  --   --  353*  BUN 16  --   --  12  CREATININE 1.16*  --  1.12* 0.94  CALCIUM  10.1  --   --  8.4*  MG  --  2.2  --   --    Estimated Creatinine Clearance: 79.1 mL/min (by C-G formula based on SCr of 0.94 mg/dL). Liver & Pancreas: Recent Labs  Lab 12/22/23 1932  AST 37  ALT 31  ALKPHOS 119  BILITOT 0.7  PROT 9.5*  ALBUMIN 4.9   Recent Labs  Lab 12/22/23 1932  LIPASE 26   No results for input(s): AMMONIA in the last 168 hours. Diabetic: No results for input(s): HGBA1C in the last 72 hours. Recent Labs  Lab 12/22/23 1835 12/22/23 1904 12/23/23 0040 12/23/23 0746   GLUCAP 211* 207* 283* 387*   Cardiac Enzymes: No results for input(s): CKTOTAL, CKMB, CKMBINDEX, TROPONINI in the last 168 hours. No results for input(s): PROBNP in the last 8760 hours. Coagulation Profile: No results for input(s): INR, PROTIME in the last 168 hours. Thyroid  Function Tests: No results for input(s): TSH, T4TOTAL, FREET4, T3FREE, THYROIDAB in the last 72 hours. Lipid Profile: No results for input(s): CHOL, HDL, LDLCALC, TRIG, CHOLHDL, LDLDIRECT in the last 72 hours. Anemia Panel: No results for input(s): VITAMINB12, FOLATE, FERRITIN, TIBC, IRON, RETICCTPCT in the last 72 hours. Urine analysis:    Component Value Date/Time   COLORURINE AMBER (A) 12/22/2023 1959   APPEARANCEUR TURBID (A) 12/22/2023 1959   LABSPEC 1.029 12/22/2023  1959   PHURINE 5.0 12/22/2023 1959   GLUCOSEU 50 (A) 12/22/2023 1959   HGBUR NEGATIVE 12/22/2023 1959   BILIRUBINUR SMALL (A) 12/22/2023 1959   BILIRUBINUR negative 06/08/2020 0928   KETONESUR 5 (A) 12/22/2023 1959   PROTEINUR >=300 (A) 12/22/2023 1959   UROBILINOGEN 1.0 01/17/2023 2050   NITRITE NEGATIVE 12/22/2023 1959   LEUKOCYTESUR NEGATIVE 12/22/2023 1959   Sepsis Labs: Invalid input(s): PROCALCITONIN, LACTICIDVEN  Microbiology: No results found for this or any previous visit (from the past 240 hours).  Radiology Studies: CT ABDOMEN PELVIS W CONTRAST Result Date: 12/22/2023 CLINICAL DATA:  Abdominal pain. EXAM: CT ABDOMEN AND PELVIS WITH CONTRAST TECHNIQUE: Multidetector CT imaging of the abdomen and pelvis was performed using the standard protocol following bolus administration of intravenous contrast. RADIATION DOSE REDUCTION: This exam was performed according to the departmental dose-optimization program which includes automated exposure control, adjustment of the mA and/or kV according to patient size and/or use of iterative reconstruction technique. CONTRAST:  OMNIPAQUE   IOHEXOL  300 MG/ML  SOLN COMPARISON:  November 12, 2023 FINDINGS: Lower chest: No acute abnormality. Hepatobiliary: There is diffuse fatty infiltration of the liver parenchyma. A stable 1.7 cm x 3.7 cm x 5.1 cm area of parenchymal low attenuation (approximately 60.02 Hounsfield units) is seen within the posteromedial aspect of the right lobe of the liver (segment 4). The gallbladder is not identified. There is no evidence of biliary dilatation. Pancreas: Unremarkable. No pancreatic ductal dilatation or surrounding inflammatory changes. Spleen: Normal in size without focal abnormality. Adrenals/Urinary Tract: Adrenal glands are unremarkable. Kidneys are normal, without obstructing renal calculi, focal lesion, or hydronephrosis. A 2 mm nonobstructing renal calculus is seen within the mid left kidney. The urinary bladder is poorly distended and subsequently limited in evaluation. Mild diffuse urinary bladder wall thickening is noted. Stomach/Bowel: Stomach is within normal limits. Appendix appears normal. No evidence of bowel wall thickening, distention, or inflammatory changes. Vascular/Lymphatic: No significant vascular findings are present. No enlarged abdominal or pelvic lymph nodes. Reproductive: Status post hysterectomy. No adnexal masses. Other: No abdominal wall hernia or abnormality. No abdominopelvic ascites. Musculoskeletal: No acute or significant osseous findings. IMPRESSION: 1. Hepatic steatosis. 2. Stable area of focal fat within the posteromedial aspect of the right lobe of the liver, as confirmed on prior MR abdomen (dated October 05, 2023). 3. Mild diffuse urinary bladder wall thickening which may be secondary to poor distention. Correlation with urinalysis is recommended to exclude cystitis. 4. 2 mm nonobstructing left renal calculus. 5. Evidence of prior hysterectomy. Electronically Signed   By: Suzen Dials M.D.   On: 12/22/2023 22:12   CT Head Wo Contrast Result Date: 12/22/2023 CLINICAL  DATA:  Central vertigo EXAM: CT HEAD WITHOUT CONTRAST TECHNIQUE: Contiguous axial images were obtained from the base of the skull through the vertex without intravenous contrast. RADIATION DOSE REDUCTION: This exam was performed according to the departmental dose-optimization program which includes automated exposure control, adjustment of the mA and/or kV according to patient size and/or use of iterative reconstruction technique. COMPARISON:  06/14/2020 FINDINGS: Brain: There is no mass, hemorrhage or extra-axial collection. The size and configuration of the ventricles and extra-axial CSF spaces are normal. There is hypoattenuation of the white matter, most commonly indicating chronic small vessel disease. Vascular: No hyperdense vessel or unexpected vascular calcification. Skull: The visualized skull base, calvarium and extracranial soft tissues are normal. Sinuses/Orbits: No fluid levels or advanced mucosal thickening of the visualized paranasal sinuses. No mastoid or middle ear effusion. Normal orbits. Other:  None. IMPRESSION: 1. No acute intracranial abnormality. 2. Chronic small vessel disease. Electronically Signed   By: Franky Stanford M.D.   On: 12/22/2023 21:58      Maryl Blalock T. Aanvi Voyles [facility]  If 7PM-7AM, please contact night-coverage [email or web address] 12/23/2023, 12:37 PM

## 2023-12-23 NOTE — ED Notes (Signed)
 .ED TO INPATIENT HANDOFF REPORT  Name/Age/Gender Whitney Benton 52 y.o. female  Code Status    Code Status Orders  (From admission, onward)           Start     Ordered   12/22/23 2313  Full code  Continuous       Question:  By:  Answer:  Consent: discussion documented in EHR   12/22/23 2313           Code Status History     Date Active Date Inactive Code Status Order ID Comments User Context   06/20/2023 1404 06/24/2023 1659 Full Code [id number]  Celinda Alm Lot, MD ED   04/30/2023 0526 05/03/2023 1727 Full Code [id number]  Marcene Eva NOVAK, DO ED   01/23/2021 1408 01/26/2021 2230 Full Code [id number]  Selene Henderson, MD ED   10/02/2019 2126 10/04/2019 1753 Full Code [id number]  Alfornia Madison, MD ED   11/14/2014 0323 11/14/2014 2117 Full Code [id number]  Hilma Rankins, MD Inpatient       Home/SNF/Other Home  Chief Complaint Diabetic gastroparesis (HCC) [E11.43, K31.84]  Level of Care/Admitting Diagnosis ED Disposition     ED Disposition  Admit   Condition  --   Comment  Hospital Area: [facility] [[id number]]  Level of Care: Progressive [102]  Admit to Progressive based on following criteria: CARDIOVASCULAR & THORACIC of moderate stability with acute coronary syndrome symptoms/low risk myocardial infarction/hypertensive urgency/arrhythmias/heart failure potentially compromising stability and stable post cardiovascular intervention patients.  May place patient in observation at [facility] or [facility] if equivalent level of care is available:: No  Covid Evaluation: Asymptomatic - no recent exposure (last 10 days) testing not required  Diagnosis: Diabetic gastroparesis [facility]) [[id number]]  Admitting Physician: VERNON RANKS [[id number]]  Attending Physician: VERNON RANKS [phone]          Medical History Past Medical History:  Diagnosis Date   Allergy    Anxiety    Arthritis    Asthma    Depression    Diabetes mellitus without complication  (HCC)    GERD (gastroesophageal reflux disease)    Glaucoma suspect    Hyperlipidemia    Trichomonas infection     Allergies Allergies  Allergen Reactions   Aspirin Hives   Trulicity  [Dulaglutide ] Other (See Comments)    DC'd due to chronic gastric and abdominal pain with nausea   Oxycodone  Nausea And Vomiting    IV Location/Drains/Wounds Patient Lines/Drains/Airways Status     Active Line/Drains/Airways     Name Placement date Placement time Site Days   Peripheral IV 12/22/23 20 G Left Antecubital 12/22/23  1928  Antecubital  1            Labs/Imaging Results for orders placed or performed during the hospital encounter of 12/22/23 (from the past 48 hours)  POC CBG, ED     Status: Abnormal   Collection Time: 12/22/23  6:35 PM  Result Value Ref Range   Glucose-Capillary 211 (H) 70 - 99 mg/dL    Comment: Glucose reference range applies only to samples taken after fasting for at least 8 hours.  Basic metabolic panel     Status: Abnormal   Collection Time: 12/22/23  6:35 PM  Result Value Ref Range   Sodium 136 135 - 145 mmol/L   Potassium 3.2 (L) 3.5 - 5.1 mmol/L   Chloride 99 98 - 111 mmol/L   CO2 23 22 - 32 mmol/L   Glucose, Bld 223 (H) 70 -  99 mg/dL    Comment: Glucose reference range applies only to samples taken after fasting for at least 8 hours.   BUN 16 6 - 20 mg/dL   Creatinine, Ser 8.83 (H) 0.44 - 1.00 mg/dL   Calcium  10.1 8.9 - 10.3 mg/dL   GFR, Estimated 56 (L) >60 mL/min    Comment: (NOTE) Calculated using the CKD-EPI Creatinine Equation (2021)    Anion gap 14 5 - 15    Comment: Performed at [facility], [address] [place], [place] [address]  CBC     Status: Abnormal   Collection Time: 12/22/23  6:35 PM  Result Value Ref Range   WBC 11.9 (H) 4.0 - 10.5 K/uL   RBC 5.55 (H) 3.87 - 5.11 MIL/uL   Hemoglobin 15.3 (H) 12.0 - 15.0 g/dL   HCT 53.7 (H) 63.9 - 53.9 %   MCV 83.2 80.0 - 100.0 fL   MCH 27.6 26.0 - 34.0 pg   MCHC  33.1 30.0 - 36.0 g/dL   RDW 85.5 88.4 - 84.4 %   Platelets 582 (H) 150 - 400 K/uL   nRBC 0.0 0.0 - 0.2 %    Comment: Performed at [facility], [address], [place], [place] [address]  CBG monitoring, ED     Status: Abnormal   Collection Time: 12/22/23  7:04 PM  Result Value Ref Range   Glucose-Capillary 207 (H) 70 - 99 mg/dL    Comment: Glucose reference range applies only to samples taken after fasting for at least 8 hours.  Magnesium      Status: None   Collection Time: 12/22/23  7:32 PM  Result Value Ref Range   Magnesium  2.2 1.7 - 2.4 mg/dL    Comment: Performed at [facility], [address] [place], [place] [address]  Lipase, blood     Status: None   Collection Time: 12/22/23  7:32 PM  Result Value Ref Range   Lipase 26 11 - 51 U/L    Comment: Performed at [facility], [address], [place], [place] [address]  Hepatic function panel     Status: Abnormal   Collection Time: 12/22/23  7:32 PM  Result Value Ref Range   Total Protein 9.5 (H) 6.5 - 8.1 g/dL   Albumin 4.9 3.5 - 5.0 g/dL   AST 37 15 - 41 U/L   ALT 31 0 - 44 U/L   Alkaline Phosphatase 119 38 - 126 U/L   Total Bilirubin 0.7 0.0 - 1.2 mg/dL   Bilirubin, Direct 0.1 0.0 - 0.2 mg/dL   Indirect Bilirubin 0.6 0.3 - 0.9 mg/dL    Comment: Performed at [facility], [address], [place], [place] [address]  Urinalysis, Routine w reflex microscopic -Urine, Clean Catch     Status: Abnormal   Collection Time: 12/22/23  7:59 PM  Result Value Ref Range   Color, Urine AMBER (A) YELLOW    Comment: BIOCHEMICALS MAY BE AFFECTED BY COLOR   APPearance TURBID (A) CLEAR   Specific Gravity, Urine 1.029 1.005 - 1.030   pH 5.0 5.0 - 8.0   Glucose, UA 50 (A) NEGATIVE mg/dL   Hgb urine dipstick NEGATIVE NEGATIVE   Bilirubin Urine SMALL (A) NEGATIVE   Ketones, ur 5 (A) NEGATIVE mg/dL   Protein, ur >=699 (A) NEGATIVE mg/dL   Nitrite NEGATIVE NEGATIVE    Leukocytes,Ua NEGATIVE NEGATIVE   RBC / HPF 0-5 0 - 5 RBC/hpf   WBC,  UA 6-10 0 - 5 WBC/hpf   Bacteria, UA FEW (A) NONE SEEN   Squamous Epithelial / HPF 21-50 0 - 5 /HPF   Mucus PRESENT    Hyaline Casts, UA PRESENT    Amorphous Crystal PRESENT     Comment: Performed at [facility], [address], [place], [place] [address]  CBG monitoring, ED     Status: Abnormal   Collection Time: 12/23/23 12:40 AM  Result Value Ref Range   Glucose-Capillary 283 (H) 70 - 99 mg/dL    Comment: Glucose reference range applies only to samples taken after fasting for at least 8 hours.  CBC     Status: Abnormal   Collection Time: 12/23/23 12:57 AM  Result Value Ref Range   WBC 11.6 (H) 4.0 - 10.5 K/uL   RBC 4.77 3.87 - 5.11 MIL/uL   Hemoglobin 13.1 12.0 - 15.0 g/dL   HCT 59.8 63.9 - 53.9 %   MCV 84.1 80.0 - 100.0 fL   MCH 27.5 26.0 - 34.0 pg   MCHC 32.7 30.0 - 36.0 g/dL   RDW 85.7 88.4 - 84.4 %   Platelets 488 (H) 150 - 400 K/uL   nRBC 0.0 0.0 - 0.2 %    Comment: Performed at [facility], [address], [place], [place] [address]  Creatinine, serum     Status: Abnormal   Collection Time: 12/23/23 12:57 AM  Result Value Ref Range   Creatinine, Ser 1.12 (H) 0.44 - 1.00 mg/dL   GFR, Estimated 59 (L) >60 mL/min    Comment: (NOTE) Calculated using the CKD-EPI Creatinine Equation (2021) Performed at [facility], [address] [place], [place] [address]    CT ABDOMEN PELVIS W CONTRAST Result Date: 12/22/2023 CLINICAL DATA:  Abdominal pain. EXAM: CT ABDOMEN AND PELVIS WITH CONTRAST TECHNIQUE: Multidetector CT imaging of the abdomen and pelvis was performed using the standard protocol following bolus administration of intravenous contrast. RADIATION DOSE REDUCTION: This exam was performed according to the departmental dose-optimization program which includes automated exposure control, adjustment of the mA and/or kV according to patient size  and/or use of iterative reconstruction technique. CONTRAST:  OMNIPAQUE  IOHEXOL  300 MG/ML  SOLN COMPARISON:  November 12, 2023 FINDINGS: Lower chest: No acute abnormality. Hepatobiliary: There is diffuse fatty infiltration of the liver parenchyma. A stable 1.7 cm x 3.7 cm x 5.1 cm area of parenchymal low attenuation (approximately 60.02 Hounsfield units) is seen within the posteromedial aspect of the right lobe of the liver (segment 4). The gallbladder is not identified. There is no evidence of biliary dilatation. Pancreas: Unremarkable. No pancreatic ductal dilatation or surrounding inflammatory changes. Spleen: Normal in size without focal abnormality. Adrenals/Urinary Tract: Adrenal glands are unremarkable. Kidneys are normal, without obstructing renal calculi, focal lesion, or hydronephrosis. A 2 mm nonobstructing renal calculus is seen within the mid left kidney. The urinary bladder is poorly distended and subsequently limited in evaluation. Mild diffuse urinary bladder wall thickening is noted. Stomach/Bowel: Stomach is within normal limits. Appendix appears normal. No evidence of bowel wall thickening, distention, or inflammatory changes. Vascular/Lymphatic: No significant vascular findings are present. No enlarged abdominal or pelvic lymph nodes. Reproductive: Status post hysterectomy. No adnexal masses. Other: No abdominal wall hernia or abnormality. No abdominopelvic ascites. Musculoskeletal: No acute or significant osseous findings. IMPRESSION: 1. Hepatic steatosis. 2. Stable area of focal fat within the posteromedial aspect of the right lobe of the liver, as confirmed on prior MR abdomen (dated October 05, 2023). 3. Mild diffuse urinary bladder wall thickening which may be secondary to poor distention. Correlation with urinalysis is recommended to exclude cystitis. 4. 2 mm nonobstructing left renal calculus. 5. Evidence of prior hysterectomy. Electronically Signed   By: Suzen Dials M.D.   On:  12/22/2023 22:12   CT Head Wo Contrast Result Date: 12/22/2023 CLINICAL DATA:  Central vertigo EXAM: CT HEAD WITHOUT CONTRAST TECHNIQUE: Contiguous axial images were obtained from the base of the skull through the vertex without intravenous contrast. RADIATION DOSE REDUCTION: This exam was performed according to the departmental dose-optimization program which includes automated exposure control, adjustment of the mA and/or kV according to patient size and/or use of iterative reconstruction technique. COMPARISON:  06/14/2020 FINDINGS: Brain: There is no mass, hemorrhage or extra-axial collection. The size and configuration of the ventricles and extra-axial CSF spaces are normal. There is hypoattenuation of the white matter, most commonly indicating chronic small vessel disease. Vascular: No hyperdense vessel or unexpected vascular calcification. Skull: The visualized skull base, calvarium and extracranial soft tissues are normal. Sinuses/Orbits: No fluid levels or advanced mucosal thickening of the visualized paranasal sinuses. No mastoid or middle ear effusion. Normal orbits. Other: None. IMPRESSION: 1. No acute intracranial abnormality. 2. Chronic small vessel disease. Electronically Signed   By: Franky Stanford M.D.   On: 12/22/2023 21:58    Pending Labs Unresulted Labs (From admission, onward)     Start     Ordered   12/29/23 0500  Creatinine, serum  (enoxaparin  (LOVENOX )    CrCl >/= 30 ml/min)  Weekly,   R     Comments: while on enoxaparin  therapy    12/22/23 2313   12/23/23 0500  Basic metabolic panel  Tomorrow morning,   R        12/22/23 2313   12/23/23 0500  CBC  Tomorrow morning,   R        12/22/23 2313   12/22/23 2314  Hemoglobin A1c  Add-on,   AD       Comments: To assess prior glycemic control    12/22/23 2313            Vitals/Pain Today's Vitals   12/23/23 0145 12/23/23 0200 12/23/23 0215 12/23/23 0230  BP: (!) 145/95 (!) 148/89 (!) 145/94 (!) 137/95  Pulse: (!) 101 96  95 89  Resp:      Temp:      TempSrc:      SpO2: 96% 94% 93% 96%  Weight:      Height:      PainSc:        Isolation Precautions No active isolations  Medications Medications  amLODipine  (NORVASC ) tablet 10 mg (has no administration in time range)  busPIRone  (BUSPAR ) tablet 15 mg (15 mg Oral Given 12/23/23 0045)  DULoxetine  (CYMBALTA ) DR capsule 20 mg (has no administration in time range)  DULoxetine  (CYMBALTA ) DR capsule 60 mg (has no administration in time range)  gabapentin  (NEURONTIN ) capsule 300 mg (300 mg Oral Given 12/23/23 0046)  insulin  regular human CONCENTRATED (HUMULIN  R) 500 UNIT/ML KwikPen 60 Units (has no administration in time range)    And  insulin  regular human CONCENTRATED (HUMULIN  R) 500 UNIT/ML KwikPen 40 Units (has no administration in time range)    And  insulin  regular human CONCENTRATED (HUMULIN  R) 500 UNIT/ML KwikPen 50 Units (has no administration in time range)  metoprolol  tartrate (LOPRESSOR ) tablet 12.5 mg (12.5 mg Oral Given 12/23/23 0046)  OLANZapine  (ZYPREXA ) tablet 15 mg (15 mg Oral  Given 12/23/23 0045)  traZODone  (DESYREL ) tablet 50-100 mg (has no administration in time range)  enoxaparin  (LOVENOX ) injection 50 mg (has no administration in time range)  sodium chloride  flush (NS) 0.9 % injection 3 mL (3 mLs Intravenous Given 12/23/23 0048)  acetaminophen  (TYLENOL ) tablet 650 mg (has no administration in time range)    Or  acetaminophen  (TYLENOL ) suppository 650 mg (has no administration in time range)  ondansetron  (ZOFRAN ) tablet 4 mg (has no administration in time range)    Or  ondansetron  (ZOFRAN ) injection 4 mg (has no administration in time range)  insulin  aspart (novoLOG ) injection 0-15 Units (has no administration in time range)  insulin  aspart (novoLOG ) injection 0-5 Units (3 Units Subcutaneous Given 12/23/23 0043)  metoCLOPramide  (REGLAN ) injection 10 mg (has no administration in time range)  0.9 % NaCl with KCl 40 mEq / L  infusion (  Intravenous New Bag/Given 12/23/23 0057)  albuterol  (PROVENTIL ) (2.5 MG/3ML) 0.083% nebulizer solution 2.5 mg (has no administration in time range)  meclizine  (ANTIVERT ) tablet 25 mg (25 mg Oral Given 12/22/23 1929)  sodium chloride  0.9 % bolus 1,000 mL (0 mLs Intravenous Stopped 12/22/23 2035)  metoCLOPramide  (REGLAN ) injection 10 mg (10 mg Intravenous Given 12/22/23 1929)  iohexol  (OMNIPAQUE ) 300 MG/ML solution 100 mL (100 mLs Intravenous Contrast Given 12/22/23 2020)  potassium chloride  SA (KLOR-CON  M) CR tablet 40 mEq (40 mEq Oral Given 12/22/23 2304)  sodium chloride  0.9 % bolus 1,000 mL (1,000 mLs Intravenous New Bag/Given 12/23/23 0048)    Mobility walks

## 2023-12-23 NOTE — Inpatient Diabetes Management (Signed)
 Inpatient Diabetes Program Recommendations  AACE/ADA: New Consensus Statement on Inpatient Glycemic Control (2015)  Target Ranges:  Prepandial:   less than 140 mg/dL      Peak postprandial:   less than 180 mg/dL (1-2 hours)      Critically ill patients:  140 - 180 mg/dL   Lab Results  Component Value Date   GLUCAP 387 (H) 12/23/2023   HGBA1C 9.9 (H) 11/17/2023    Review of Glycemic Control  Diabetes history: DM2 Outpatient Diabetes medications: U-500 60 in am, 40 at lunch, 50-55 units before dinner Current orders for Inpatient glycemic control: U-500 65-45-55 TID, Novolog  0-20 TID with meals and 0-5 HS  HgbA1C 9.9%   Inpatient Diabetes Program Recommendations:    Agree with orders.  Spoke with pt at bedside regarding her diabetes. Pt states she changed Endos and now goes to [facility]. Recently started on U-500 insulin . Has Dexcom and states blood sugars have been much better controlled. Did not like insulin  pump and seems happy with the U-500 insulin . Trying to eat healthier and lose some weight. Stressed to the patient the importance of improving glycemic control to prevent further complications from uncontrolled diabetes. Discussed impact of nutrition, exercise, stress, sickness, and medications on diabetes control.  Discussed above with RN.  Thank you. Shona Brandy, RD, LDN, CDCES Inpatient Diabetes Coordinator [phone]

## 2023-12-24 DIAGNOSIS — R112 Nausea with vomiting, unspecified: Secondary | ICD-10-CM | POA: Diagnosis not present

## 2023-12-24 DIAGNOSIS — K3184 Gastroparesis: Secondary | ICD-10-CM | POA: Diagnosis not present

## 2023-12-24 DIAGNOSIS — E1143 Type 2 diabetes mellitus with diabetic autonomic (poly)neuropathy: Secondary | ICD-10-CM | POA: Diagnosis not present

## 2023-12-24 LAB — COMPREHENSIVE METABOLIC PANEL
ALT: 22 U/L (ref 0–44)
AST: 21 U/L (ref 15–41)
Albumin: 3.7 g/dL (ref 3.5–5.0)
Alkaline Phosphatase: 95 U/L (ref 38–126)
Anion gap: 6 (ref 5–15)
BUN: 19 mg/dL (ref 6–20)
CO2: 27 mmol/L (ref 22–32)
Calcium: 9.2 mg/dL (ref 8.9–10.3)
Chloride: 108 mmol/L (ref 98–111)
Creatinine, Ser: 1.06 mg/dL — ABNORMAL HIGH (ref 0.44–1.00)
GFR, Estimated: >60 mL/min (ref >60)
Glucose, Bld: 74 mg/dL (ref 70–99)
Potassium: 3.8 mmol/L (ref 3.5–5.1)
Sodium: 141 mmol/L (ref 135–145)
Total Bilirubin: 0.4 mg/dL (ref 0.0–1.2)
Total Protein: 7.3 g/dL (ref 6.5–8.1)

## 2023-12-24 LAB — CBC
HCT: 36.8 % (ref 36.0–46.0)
Hemoglobin: 12.1 g/dL (ref 12.0–15.0)
MCH: 28.3 pg (ref 26.0–34.0)
MCHC: 32.9 g/dL (ref 30.0–36.0)
MCV: 86.2 fL (ref 80.0–100.0)
Platelets: 408 10*3/uL — ABNORMAL HIGH (ref 150–400)
RBC: 4.27 MIL/uL (ref 3.87–5.11)
RDW: 14.3 % (ref 11.5–15.5)
WBC: 7.3 10*3/uL (ref 4.0–10.5)
nRBC: 0 % (ref 0.0–0.2)

## 2023-12-24 LAB — GLUCOSE, CAPILLARY
Glucose-Capillary: 224 mg/dL — ABNORMAL HIGH (ref 70–99)
Glucose-Capillary: 287 mg/dL — ABNORMAL HIGH (ref 70–99)

## 2023-12-24 LAB — MAGNESIUM: Magnesium: 2.1 mg/dL (ref 1.7–2.4)

## 2023-12-24 NOTE — Discharge Summary (Signed)
 Physician Discharge Summary  Whitney Benton FMW:[id number] DOB: 1971/02/07 DOA: 12/22/2023  PCP: [name], MD  Admit date: 12/22/2023 Discharge date: 12/24/2023 Admitted From: Home Disposition: Home Recommendations for Outpatient Follow-up:  Follow up with PCP as below. Patient has been encouraged to contact her endocrinologist if CBG persistently > 180 Reassess glycemic control and adjust insulin  as appropriate.  Unclear why she is not on basal insulin  Check CBC and CMP in 1 week Please follow up on the following pending results: None  Home Health: Not indicated Equipment/Devices: Not indicated  Discharge Condition: Stable CODE STATUS: Full code  Follow-up Information     [facility] .   Specialty: Emergency Medicine Why: If symptoms worsen Contact information: [address] [place]  [address] [phone]        [name], MD .   Specialty: Internal Medicine Contact information: [address] [place] [address] [place] [address] [phone]                 Hospital course 53 year old F with PMH of DM-1 with gastroparesis, CAD, HTN, HLD, morbid obesity, headache, tobacco use disorder, anxiety and depression presenting with progressive nausea, vomiting, abdominal pain and dizziness for 2 days, and admitted with intractable nausea, vomiting and pain due to diabetic gastroparesis.  CT head and CT abdomen and pelvis without acute finding but the later suggested hepatic steatosis.  Labs without significant finding other than some signs of hemoconcentration.  She was not in DKA or HHS.  Patient was started on IV fluid and scheduled Reglan , and admitted.   The next day, patient's symptoms improved.  She was advanced to regular diet.  Reglan  changed to oral with meals.  On the day of discharge, patient tolerated carb modified diet with home Reglan  scheduled.  Hyperglycemia improved.   Extensively counseled on the importance of good glycemic control.  Patient is followed by endocrinology.  Advised to contact endocrinology office if CBG persistently elevated, greater than 180.  Also counseled on the importance of good hydration and watching her carbohydrate intake.  Patient is on significant dose of concentrated Humulin  R but it is unclear why patient is not on basal insulin .  She definitely needs higher dose of insulin .   See individual problem list below for more.   Problems addressed during this hospitalization Intractable nausea vomiting likely secondary to diabetic gastroparesis: She reports consistent use of p.o. Reglan  at home.  Denies marijuana use.  CT abdomen and pelvis without acute finding.  Labs with some evidence of hemoconcentration.  Lipase within normal.  UA suggestive proteinuria.  She has no UTI symptoms.  Nausea and vomiting resolved.  Abdominal pain improved.  Tolerated carb modified diet with home p.o. Reglan  -Continue home Reglan . -Emphasized the importance of good glycemic control     Uncontrolled type 1 diabetes mellitus with hyperglycemia: A1c 9.9% on 11/25.  Hyperglycemia improved. -Discharged on home concentrated Humulin  R 60 units with breakfast, 40 units with lunch and 50 units with dinner.  Unclear if this would provide enough glycemic control overnight.  Also unclear why she is not on basal insulin .  She is followed by endocrinology.  Advised to contact her endocrinologist.  Extensively counseled on the importance of lifestyle change including diet and exercise as well.   Mild renal insufficiency: Baseline creatinine appears to be between 0.8-0.9.  Resolved.   Essential hypertension: Normotensive. -Continue home meds-amlodipine  and metoprolol .   Sinus tachycardia: Likely due  to #1.  Resolved. -Continue p.o. metoprolol .   Hypokalemia: Resolved.   Anxiety/depression/insomnia: Stable -Continue home meds-Cymbalta , BuSpar , Zyprexa  and trazodone .    Hyperlipidemia -Continue home statin.   Hepatic steatosis: Denies drinking alcohol. -Encourage weight loss   Morbid obesity Body mass index is 40.03 kg/m.   Medication management: Reviewed patient's medication with patient on the day of discharge and discontinued what she does not take currently.            Time spent 45 minutes  Vital signs Vitals:   12/23/23 1311 12/23/23 1648 12/23/23 1931 12/24/23 0411  BP: 133/70 132/83 (!) 127/94 127/76  Pulse: 99 95 93 82  Temp: 99.2 F (37.3 C) 98.8 F (37.1 C) 99.7 F (37.6 C) 97.9 F (36.6 C)  Resp: 18 16 20 20   Height:      Weight:      SpO2: 96% 97% 94% 97%  TempSrc: Oral Oral Oral Oral  BMI (Calculated):         Discharge exam  GENERAL: No apparent distress.  Nontoxic. HEENT: MMM.  Vision and hearing grossly intact.  NECK: Supple.  No apparent JVD.  RESP:  No IWOB.  Fair aeration bilaterally. CVS:  RRR. Heart sounds normal.  ABD/GI/GU: BS+. Abd soft, NTND.  MSK/EXT:  Moves extremities. No apparent deformity. No edema.  SKIN: no apparent skin lesion or wound NEURO: Awake and alert. Oriented appropriately.  No apparent focal neuro deficit. PSYCH: Calm. Normal affect.   Discharge Instructions Discharge Instructions     Diet Carb Modified   Complete by: As directed    Discharge instructions   Complete by: As directed    It has been a pleasure taking care of you!  You were hospitalized due to nausea, vomiting and abdominal pain.  Your symptoms improved.  Continue using your Reglan .  Monitor your blood glucose.  Contact your endocrinologist if your blood glucose is persistently elevated, greater than 180.  Maintain good hydration.   Take care,   Increase activity slowly   Complete by: As directed       Allergies as of 12/24/2023       Reactions   Aspirin Hives   Trulicity  [dulaglutide ] Other (See Comments)   DC'd due to chronic gastric and abdominal pain with nausea   Oxycodone  Nausea And Vomiting         Medication List     STOP taking these medications    HYDROcodone -acetaminophen  5-325 MG tablet Commonly known as: NORCO/VICODIN   Lybalvi  15-10 MG Tabs Generic drug: OLANZapine -Samidorphan   metroNIDAZOLE  500 MG tablet Commonly known as: FLAGYL    terconazole  0.4 % vaginal cream Commonly known as: TERAZOL 7    triamcinolone  cream 0.1 % Commonly known as: KENALOG        TAKE these medications    Accu-Chek Guide test strip Generic drug: glucose blood Use to check blood sugar 3 times daily   Accu-Chek Guide w/Device Kit Use to check blood sugar 3 times daily.   Accu-Chek Softclix Lancets lancets Use to check blood sugar 3 times daily   Aimovig  140 MG/ML Soaj Generic drug: Erenumab -aooe Inject 140 mg into the skin every 28 (twenty-eight) days.   albuterol  108 (90 Base) MCG/ACT inhaler Commonly known as: VENTOLIN  HFA Inhale 2 puffs into the lungs every 6 (six) hours as needed for wheezing or shortness of breath (Cough).   amLODipine  10 MG tablet Commonly known as: NORVASC  Take 1 tablet (10 mg total) by mouth daily.   atorvastatin  40 MG tablet Commonly  known as: LIPITOR Take 1 tablet (40 mg total) by mouth daily.   Baqsimi  Two Pack 3 MG/DOSE Powd Generic drug: Glucagon  Place 1 Device into the nose as needed (Low blood sugar with impaired consciousness).   busPIRone  15 MG tablet Commonly known as: BUSPAR  Take 15 mg by mouth 3 (three) times daily.   cycloSPORINE  0.05 % ophthalmic emulsion Commonly known as: Restasis  Apply 1 drop into both eyes twice a day   Dexcom G7 Sensor Misc Change sensor every 10 days.   DULoxetine  60 MG capsule Commonly known as: Cymbalta  Take 1 capsule (60 mg total) by mouth daily.   DULoxetine  20 MG capsule Commonly known as: CYMBALTA  Take 20 mg by mouth every morning.   EYE VITAMINS PO Take 1 tablet by mouth daily.   gabapentin  300 MG capsule Commonly known as: NEURONTIN  Take 1 capsule (300 mg total) by mouth 3  (three) times daily.   HumuLIN  R U-500 KwikPen 500 UNIT/ML KwikPen Generic drug: insulin  regular human CONCENTRATED Inject 60 Units into the skin daily before breakfast AND 40 Units daily before lunch AND 50 Units daily before supper. Take 30 minutes before meals. What changed: See the new instructions.   hydrOXYzine  50 MG tablet Commonly known as: ATARAX  Take 1 tablet (50 mg total) by mouth 3 (three) times daily.   metoCLOPramide  10 MG tablet Commonly known as: REGLAN  Take 1 tablet (10 mg total) by mouth 3 (three) times daily before meals. What changed: when to take this   metoprolol  tartrate 25 MG tablet Commonly known as: LOPRESSOR  Take 12.5 mg by mouth 2 (two) times daily. What changed: Another medication with the same name was removed. Continue taking this medication, and follow the directions you see here.   OLANZapine  15 MG tablet Commonly known as: ZYPREXA  Take 15 mg by mouth at bedtime.   omeprazole  40 MG capsule Commonly known as: PRILOSEC TAKE 1 CAPSULE BY MOUTH ONCE DAILY 30 MINUTES BEFORE BREAKFAST   ondansetron  4 MG disintegrating tablet Commonly known as: ZOFRAN -ODT Take 1 tablet (4 mg total) by mouth every 8 (eight) hours as needed.   SUMAtriptan  100 MG tablet Commonly known as: IMITREX  TAKE 1 TABLET EARLIEST ONSET OF MIGRAINE. MAY REPEAT IN 2 HOURS IF HEADACHE PERSISTS OR RECURS. MAXIMUM 2 TABLETS IN 24 HOURS.   TechLite Pen Needles 31G X 5 MM Misc Generic drug: Insulin  Pen Needle Use as directed with insulin  pen   traZODone  50 MG tablet Commonly known as: DESYREL  Take 1-2 tablets (50-100 mg total) by mouth at bedtime as needed for sleep.   Vitamin D  (Cholecalciferol ) 10 MCG (400 UNIT) Caps Take 400 Int'l Units/day by mouth daily.        Consultations: Diabetic coordinator  Procedures/Studies:   CT ABDOMEN PELVIS W CONTRAST Result Date: 12/22/2023 CLINICAL DATA:  Abdominal pain. EXAM: CT ABDOMEN AND PELVIS WITH CONTRAST TECHNIQUE:  Multidetector CT imaging of the abdomen and pelvis was performed using the standard protocol following bolus administration of intravenous contrast. RADIATION DOSE REDUCTION: This exam was performed according to the departmental dose-optimization program which includes automated exposure control, adjustment of the mA and/or kV according to patient size and/or use of iterative reconstruction technique. CONTRAST:  OMNIPAQUE  IOHEXOL  300 MG/ML  SOLN COMPARISON:  November 12, 2023 FINDINGS: Lower chest: No acute abnormality. Hepatobiliary: There is diffuse fatty infiltration of the liver parenchyma. A stable 1.7 cm x 3.7 cm x 5.1 cm area of parenchymal low attenuation (approximately 60.02 Hounsfield units) is seen within the posteromedial aspect  of the right lobe of the liver (segment 4). The gallbladder is not identified. There is no evidence of biliary dilatation. Pancreas: Unremarkable. No pancreatic ductal dilatation or surrounding inflammatory changes. Spleen: Normal in size without focal abnormality. Adrenals/Urinary Tract: Adrenal glands are unremarkable. Kidneys are normal, without obstructing renal calculi, focal lesion, or hydronephrosis. A 2 mm nonobstructing renal calculus is seen within the mid left kidney. The urinary bladder is poorly distended and subsequently limited in evaluation. Mild diffuse urinary bladder wall thickening is noted. Stomach/Bowel: Stomach is within normal limits. Appendix appears normal. No evidence of bowel wall thickening, distention, or inflammatory changes. Vascular/Lymphatic: No significant vascular findings are present. No enlarged abdominal or pelvic lymph nodes. Reproductive: Status post hysterectomy. No adnexal masses. Other: No abdominal wall hernia or abnormality. No abdominopelvic ascites. Musculoskeletal: No acute or significant osseous findings. IMPRESSION: 1. Hepatic steatosis. 2. Stable area of focal fat within the posteromedial aspect of the right lobe of the  liver, as confirmed on prior MR abdomen (dated October 05, 2023). 3. Mild diffuse urinary bladder wall thickening which may be secondary to poor distention. Correlation with urinalysis is recommended to exclude cystitis. 4. 2 mm nonobstructing left renal calculus. 5. Evidence of prior hysterectomy. Electronically Signed   By: Suzen Dials M.D.   On: 12/22/2023 22:12   CT Head Wo Contrast Result Date: 12/22/2023 CLINICAL DATA:  Central vertigo EXAM: CT HEAD WITHOUT CONTRAST TECHNIQUE: Contiguous axial images were obtained from the base of the skull through the vertex without intravenous contrast. RADIATION DOSE REDUCTION: This exam was performed according to the departmental dose-optimization program which includes automated exposure control, adjustment of the mA and/or kV according to patient size and/or use of iterative reconstruction technique. COMPARISON:  06/14/2020 FINDINGS: Brain: There is no mass, hemorrhage or extra-axial collection. The size and configuration of the ventricles and extra-axial CSF spaces are normal. There is hypoattenuation of the white matter, most commonly indicating chronic small vessel disease. Vascular: No hyperdense vessel or unexpected vascular calcification. Skull: The visualized skull base, calvarium and extracranial soft tissues are normal. Sinuses/Orbits: No fluid levels or advanced mucosal thickening of the visualized paranasal sinuses. No mastoid or middle ear effusion. Normal orbits. Other: None. IMPRESSION: 1. No acute intracranial abnormality. 2. Chronic small vessel disease. Electronically Signed   By: Franky Stanford M.D.   On: 12/22/2023 21:58       The results of significant diagnostics from this hospitalization (including imaging, microbiology, ancillary and laboratory) are listed below for reference.     Microbiology: No results found for this or any previous visit (from the past 240 hours).   Labs:  CBC: Recent Labs  Lab 12/22/23 1835  12/23/23 0057 12/23/23 0548 12/24/23 0405  WBC 11.9* 11.6* 10.5 7.3  HGB 15.3* 13.1 12.2 12.1  HCT 46.2* 40.1 39.0 36.8  MCV 83.2 84.1 86.1 86.2  PLT 582* 488* 420* 408*   BMP &GFR Recent Labs  Lab 12/22/23 1835 12/22/23 1932 12/23/23 0057 12/23/23 0548 12/24/23 0405  NA 136  --   --  131* 141  K 3.2*  --   --  3.9 3.8  CL 99  --   --  101 108  CO2 23  --   --  19* 27  GLUCOSE 223*  --   --  353* 74  BUN 16  --   --  12 19  CREATININE 1.16*  --  1.12* 0.94 1.06*  CALCIUM  10.1  --   --  8.4* 9.2  MG  --  2.2  --   --  2.1   Estimated Creatinine Clearance: 70.2 mL/min (A) (by C-G formula based on SCr of 1.06 mg/dL (H)). Liver & Pancreas: Recent Labs  Lab 12/22/23 1932 12/24/23 0405  AST 37 21  ALT 31 22  ALKPHOS 119 95  BILITOT 0.7 0.4  PROT 9.5* 7.3  ALBUMIN 4.9 3.7   Recent Labs  Lab 12/22/23 1932  LIPASE 26   No results for input(s): AMMONIA in the last 168 hours. Diabetic: Recent Labs    12/23/23 0057  HGBA1C 10.3*   Recent Labs  Lab 12/23/23 1132 12/23/23 1645 12/23/23 2116 12/24/23 0738 12/24/23 1134  GLUCAP 367* 180* 168* 224* 287*   Cardiac Enzymes: No results for input(s): CKTOTAL, CKMB, CKMBINDEX, TROPONINI in the last 168 hours. No results for input(s): PROBNP in the last 8760 hours. Coagulation Profile: No results for input(s): INR, PROTIME in the last 168 hours. Thyroid  Function Tests: No results for input(s): TSH, T4TOTAL, FREET4, T3FREE, THYROIDAB in the last 72 hours. Lipid Profile: No results for input(s): CHOL, HDL, LDLCALC, TRIG, CHOLHDL, LDLDIRECT in the last 72 hours. Anemia Panel: No results for input(s): VITAMINB12, FOLATE, FERRITIN, TIBC, IRON, RETICCTPCT in the last 72 hours. Urine analysis:    Component Value Date/Time   COLORURINE AMBER (A) 12/22/2023 1959   APPEARANCEUR TURBID (A) 12/22/2023 1959   LABSPEC 1.029 12/22/2023 1959   PHURINE 5.0 12/22/2023 1959    GLUCOSEU 50 (A) 12/22/2023 1959   HGBUR NEGATIVE 12/22/2023 1959   BILIRUBINUR SMALL (A) 12/22/2023 1959   BILIRUBINUR negative 06/08/2020 0928   KETONESUR 5 (A) 12/22/2023 1959   PROTEINUR >=300 (A) 12/22/2023 1959   UROBILINOGEN 1.0 01/17/2023 2050   NITRITE NEGATIVE 12/22/2023 1959   LEUKOCYTESUR NEGATIVE 12/22/2023 1959   Sepsis Labs: Invalid input(s): PROCALCITONIN, LACTICIDVEN   SIGNED:  Chidubem Chaires T Marketia Stallsmith, MD  [facility] 12/24/2023, 2:06 PM

## 2023-12-24 NOTE — Plan of Care (Signed)
  Problem: Coping: Goal: Ability to adjust to condition or change in health will improve Outcome: Progressing   Problem: Education: Goal: Knowledge of General Education information will improve Description: Including pain rating scale, medication(s)/side effects and non-pharmacologic comfort measures Outcome: Progressing   Problem: Activity: Goal: Risk for activity intolerance will decrease Outcome: Progressing   Problem: Safety: Goal: Ability to remain free from injury will improve Outcome: Progressing   

## 2023-12-24 NOTE — Progress Notes (Signed)
 Discharge instructions reviewed with patient. All questions answered. All belongings accounted for. Patient to follow up with MD in  1 week. Patient medications reviewed, with changes noted to metoprolol . Patient states she is not able to get a hold of her transportation right now, and is waiting for her ride. Will remove PIV when transport arrives.

## 2023-12-25 ENCOUNTER — Telehealth: Payer: Self-pay

## 2023-12-25 NOTE — Transitions of Care (Post Inpatient/ED Visit) (Signed)
   12/25/2023  Name: LACOSTA HARGAN MRN: [id number] DOB: 1971/11/24  Today's TOC FU Call Status: Today's TOC FU Call Status:: Successful TOC FU Call Completed TOC FU Call Complete Date: 12/25/23 Patient's Name and Date of Birth confirmed.  Transition Care Management Follow-up Telephone Call Date of Discharge: 12/24/23 Discharge Facility: [facility]) Type of Discharge: Inpatient Admission Primary Inpatient Discharge Diagnosis:: diabetic gastroparesis How have you been since you were released from the hospital?: Better Any questions or concerns?: No  Items Reviewed: Did you receive and understand the discharge instructions provided?: Yes Medications obtained,verified, and reconciled?: Partial Review Completed Reason for Partial Mediation Review: She said she has all medications as well as a Dexcom and she did not have any questions about the med regime and did not need to review the med list. Any new allergies since your discharge?: No Dietary orders reviewed?: Yes Type of Diet Ordered:: heart healthy, low sodium, carb modified. She also said she understood the ned to maintain good hydration. Do you have support at home?: Yes  Medications Reviewed Today: Medications Reviewed Today   Medications were not reviewed in this encounter     Home Care and Equipment/Supplies: Were Home Health Services Ordered?: No Any new equipment or medical supplies ordered?: No  Functional Questionnaire: Do you need assistance with bathing/showering or dressing?: No Do you need assistance with meal preparation?: No Do you need assistance with eating?: No Do you have difficulty maintaining continence: No Do you need assistance with getting out of bed/getting out of a chair/moving?: No Do you have difficulty managing or taking your medications?: No  Follow up appointments reviewed: PCP Follow-up appointment confirmed?: Yes Date of PCP follow-up appointment?: 01/08/24 Follow-up Provider: [facility] Follow-up appointment confirmed?: Yes Date of Specialist follow-up appointment?: 12/26/23 Follow-Up Specialty Provider:: podiatry; 01/01/2024- diabetic education Do you need transportation to your follow-up appointment?: No Do you understand care options if your condition(s) worsen?: Yes-patient verbalized understanding    SIGNATURE Slater Diesel, RN

## 2023-12-26 ENCOUNTER — Ambulatory Visit (INDEPENDENT_AMBULATORY_CARE_PROVIDER_SITE_OTHER): Payer: Medicaid Other | Admitting: Podiatry

## 2023-12-26 DIAGNOSIS — M79674 Pain in right toe(s): Secondary | ICD-10-CM | POA: Diagnosis not present

## 2023-12-26 DIAGNOSIS — M778 Other enthesopathies, not elsewhere classified: Secondary | ICD-10-CM | POA: Diagnosis not present

## 2023-12-26 NOTE — Progress Notes (Signed)
 Chief Complaint  Patient presents with   Toe Pain    Toe pain has improved. No pain now. Not wearing boot now. She does wear it with flare ups. Meloxicam  is helping some. PT has not started due to them not reaching out yet.     HPI: 53 y.o. female presents today for for follow-up evaluation of right foot EHL tendinitis with pain along first toe.  She does report that the pain has significantly improved.  She has no pain today.  She does occasionally report having a flareup when she does wear the cam walker boot during that time.  Ambulating in slippers today.  Her last A1c was down to 9.5 from 12.  Patient denies any nausea, fever, chills, chest pain or shortness of breath.  Past Medical History:  Diagnosis Date   Allergy    Anxiety    Arthritis    Asthma    Depression    Diabetes mellitus without complication (HCC)    GERD (gastroesophageal reflux disease)    Glaucoma suspect    Hyperlipidemia    Trichomonas infection     Past Surgical History:  Procedure Laterality Date   CESAREAN SECTION     UPPER GASTROINTESTINAL ENDOSCOPY     VAGINAL HYSTERECTOMY  12/23/2004   Fibroids, menorrhagia, benign pathology    Allergies  Allergen Reactions   Aspirin Hives   Trulicity  [Dulaglutide ] Other (See Comments)    DC'd due to chronic gastric and abdominal pain with nausea   Oxycodone  Nausea And Vomiting    ROS -negative except as stated in HPI   Physical Exam: There were no vitals filed for this visit.  General: The patient is alert and oriented x3 in no acute distress.  Dermatology: Interspaces are clear of maceration and debris.  Dry Pedal skin.  Vascular: Palpable pedal pulses bilaterally. Capillary refill within normal limits.  No appreciable edema.  No erythema or calor.  Neurological: Light touch sensation grossly intact bilateral feet.   Musculoskeletal Exam: Muscle strength 5/5 to right hallux dorsiflexion along extensor hallucis longus tendon.  No pain along  the course of the EHL tendon today or along the metatarsophalangeal joint or interphalangeal joint today.  Radiographic Exam: 10/31/2023 Normal osseous mineralization. Joint spaces preserved.  No fractures or osseous irregularities noted.  Overall rectus foot type.  Assessment/Plan of Care: 1. Extensor tendinitis of foot   2. Great toe pain, right      No orders of the defined types were placed in this encounter.  None  Discussed clinical findings with patient today.  # Right great toe pain with extensor tendinitis -Clinical findings and treatment plan discussed with patient - Has had significant improvement with immobilization - States that she does not feel she needs more meloxicam  at this point though this was helpful - She has not heard from physical therapy at this point, did advise that physical therapy could still be helpful for her going forward, advise she be on the lookout for this -Discussed importance of good supportive stiff soled shoes. - May continue to use RICE therapy as needed. - Follow-up as needed if symptoms recur or worsen.   Anner Baity L. Lamount MAUL, AACFAS [facility]     [address]  [place], [place] [address]                Office [phone]  Fax [phone]

## 2023-12-28 ENCOUNTER — Encounter: Payer: Self-pay | Admitting: Podiatry

## 2023-12-31 ENCOUNTER — Other Ambulatory Visit: Payer: Self-pay

## 2024-01-01 ENCOUNTER — Encounter: Payer: Medicaid Other | Attending: Internal Medicine | Admitting: Dietician

## 2024-01-01 ENCOUNTER — Other Ambulatory Visit: Payer: Self-pay

## 2024-01-01 ENCOUNTER — Encounter: Payer: Self-pay | Admitting: Dietician

## 2024-01-01 DIAGNOSIS — E1069 Type 1 diabetes mellitus with other specified complication: Secondary | ICD-10-CM | POA: Insufficient documentation

## 2024-01-01 NOTE — Progress Notes (Signed)
 240 lbs .950-  Medical Nutrition Therapy  Appointment Start time:  [phone] Appointment End time:  1020  Patient is here today alone.  She was last seen by this RD on 10/23/2023.  N/V dizziness resulting in ER visit 12/22/2023 - they thought it was due to her gastroparesis but patient stated that she thought this was a virus.  She also stated that she had problems with increased blood pressure at this time. Increased stress - house is to go up for foreclosure - currently living with her son as he is concerned about her. Walks on the treadmill for 15 minutes 3 times per day.  States that this is getting easier. Runs out of food at the end of the month and is using food banks.  Primary concerns today: Patient is unsure  Referral diagnosis: Type 1 Diabetes Preferred learning style: no preference indicated Learning readiness: contemplating, ready   NUTRITION ASSESSMENT  63 230 lbs 10/23/2023 215 lbs 09/20/2023 when she couldn't eat due to gastroparesis  Clinical Medical Hx: Type 1 Diabetes, depression (PHQ-9 score of 16) which sometimes affects her diabetes care,  gastroparesis Medications: U500- 60 units before breakfast, 40 before lunch, and 50-55 units before dinner, ambien , GERD, HLD, neuropathy (balance issues) She has a Medtronic insulin  pump but has not used this since early 2024 as she states it did not do right for her and sometimes had a problem affording supplies. Labs: A1C 9.9% 11/17/2023 decreased from 10.5% 09/08/2023, GAD - positive Notable Signs/Symptoms: hyperglycemia CGM:  Freestyle Libre >> now using the Dexcom (12/2023)   CGM Results from download: 10/23/2023 01/01/24  % Time CGM active:   94 %   (Goal >70%) 86  Average glucose:   244 mg/dL for 14 days 772 x 14 days  Glucose management indicator:   9.1 % 8.7%  Time in range (70-180 mg/dL):   26 %   (Goal >29%) 34  Time High (181-250 mg/dL):   27 %   (Goal < 74%) 24  Time Very High (>250 mg/dL):    46 %   (Goal < 5%) 37   Time Low (54-69 mg/dL):   1 %   (Goal <5%) 2  Time Very Low (<54 mg/dL):   0 %   (Goal <8%) 1        Lifestyle & Dietary Hx Patient lives with her sister and her sister's children OR she stays with her son.  [place], uses the programme researcher, broadcasting/film/video.  Some food insecurity She is not working and is trying to get on disability due to plantar fascitis and carpel tunnel. Her son is encouraging her to go to the gym with him.  Supplements: MVI, vitamin D  Sleep: poor - goes to sleep but wakes up every hour due to stress.  Sometimes stress is diabetes related Stress / self-care: high stress Current average weekly physical activity: runs up and down her son's stairs and helps care for her 2 yo grandson, treadmill 15 minutes tid  24-Hr Dietary Recall Snack:  greek yogurt First Meal: Skipped but usually 2-3 boiled egg, plain instant oatmeal. Decaf coffee with no sugar creamer and splenda Snack: none Second Meal: skipped Snack: none Third Meal: liver pudding, eggs, 1 slice [employer] toast, grits with cheese Snack: none Beverages: water , Zero Pepsi, sugar free flavor packets, decaf coffee with no sugar creamer, splenda  NUTRITION DIAGNOSIS  NB-1.1 Food and nutrition-related knowledge deficit As related to balance of carbohydrate, protein, and fat.  As evidenced by diet hx  and patient report.  NUTRITION INTERVENTION  Nutrition education (E-1) on the following topics:  Discussed her elevated glucose levels  Discussed guidelines for checking for Ketones and what to do about the value Discussed importance of continued physical activity Discussed to continue to avoid fried foods as fried foods are more difficult to digest Discussed importance of more regular eating habits and increasing her vegetable intake as able Discussed importance of rating her insulin  injection sites  Handouts Provided Include (previous visit) Gastroparesis nutrition therapy from AND How to Thrive:  A Guide for Your Journey  with Diabetes by the ADA  Learning Style & Readiness for Change Teaching method utilized: Visual & Auditory  Demonstrated degree of understanding via: Teach Back  Barriers to learning/adherence to lifestyle change: motivation, stress, depression  Goals Established by Pt  Consider purchasing Ketone strips and check urine ketones if blood glucose remains higher than 350 for more than 300, if sick or as instructed by your doctor.  Call your doctor if you have moderate- high ketones for treatment advice.  Continue to walk most every day. Aim to eat regular meals Increase your vegetable intake   Continue to bake rather than fry.  Fat is difficult to digest and can make your gastroparesis symptoms worse as well as make it difficult to control your blood glucose. Aim for 2-3 Carb Choices per meal (30-45 grams) +/- 1 either way  Aim for 0-1 Carbs per snack if hungry  Include protein in moderation with your meals and snacks Consider reading food labels for Total Carbohydrate of foods Consistently use your Dexcom Continue taking medication as directed by MD Be sure to rotate your insulin  injection site  MONITORING & EVALUATION Dietary intake, weekly physical activity in 2 months.  Next Steps  Patient is to call for questions.

## 2024-01-01 NOTE — Patient Instructions (Addendum)
 Consider purchasing Ketone strips and check urine ketones if blood glucose remains higher than 350 for more than 300, if sick or as instructed by your doctor.  Call your doctor if you have moderate- high ketones for treatment advice.  Continue to walk most every day. Aim to eat regular meals Increase your vegetable intake   Continue to bake rather than fry.  Fat is difficult to digest and can make your gastroparesis symptoms worse as well as make it difficult to control your blood glucose. Aim for 2-3 Carb Choices per meal (30-45 grams) +/- 1 either way  Aim for 0-1 Carbs per snack if hungry  Include protein in moderation with your meals and snacks Consider reading food labels for Total Carbohydrate of foods Consistently use your Dexcom Continue taking medication as directed by MD Be sure to rotate your insulin  injection site

## 2024-01-07 ENCOUNTER — Other Ambulatory Visit: Payer: Self-pay

## 2024-01-08 ENCOUNTER — Other Ambulatory Visit: Payer: Self-pay

## 2024-01-08 ENCOUNTER — Encounter: Payer: Self-pay | Admitting: Internal Medicine

## 2024-01-08 ENCOUNTER — Ambulatory Visit: Payer: Medicaid Other | Attending: Internal Medicine | Admitting: Internal Medicine

## 2024-01-08 DIAGNOSIS — F331 Major depressive disorder, recurrent, moderate: Secondary | ICD-10-CM

## 2024-01-08 DIAGNOSIS — E1069 Type 1 diabetes mellitus with other specified complication: Secondary | ICD-10-CM

## 2024-01-08 DIAGNOSIS — E1143 Type 2 diabetes mellitus with diabetic autonomic (poly)neuropathy: Secondary | ICD-10-CM

## 2024-01-08 DIAGNOSIS — Z794 Long term (current) use of insulin: Secondary | ICD-10-CM | POA: Diagnosis not present

## 2024-01-08 DIAGNOSIS — E66813 Obesity, class 3: Secondary | ICD-10-CM | POA: Diagnosis not present

## 2024-01-08 DIAGNOSIS — E1043 Type 1 diabetes mellitus with diabetic autonomic (poly)neuropathy: Secondary | ICD-10-CM

## 2024-01-08 DIAGNOSIS — F1994 Other psychoactive substance use, unspecified with psychoactive substance-induced mood disorder: Secondary | ICD-10-CM

## 2024-01-08 DIAGNOSIS — Z2821 Immunization not carried out because of patient refusal: Secondary | ICD-10-CM

## 2024-01-08 DIAGNOSIS — Z6841 Body Mass Index (BMI) 40.0 and over, adult: Secondary | ICD-10-CM | POA: Diagnosis not present

## 2024-01-08 NOTE — Progress Notes (Signed)
Patient ID: Whitney Benton, female    DOB: [birth year]  MRN: [id number]  CC: Diabetes (DM f/u. /No questions / concerns/No to flu vax)   Subjective: Whitney Benton is a 53 y.o. female who presents for chronic ds management/ER f/u. Her concerns today include:  Pt with hx of depression, migraines (Dr. 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  Started on Norvasc for possible vasospasms     Discussed the use of AI scribe software for clinical note transcription with the patient, who gave verbal consent to proceed.  History of Present Illness   Whitney Benton, a patient with a history of diabetes and depression, presents for a follow-up after a recent hospitalization due to recurrent vomiting and abdominal pain attributed to diabetic gastroparesis. She was discharged with Reglan 10 mg, which she takes with meals, and reports that it has been effective in reducing vomiting and helping keep food down. She is aware of the potential for movement disorders with prolonged Reglan use and agrees to a referral to her gastroenterologist, Dr. Marina Goodell, for further management options.  DM/Obesity: Lab Results  Component Value Date   HGBA1C 10.3 (H) 12/23/2023  Her diabetes is managed with Humulin R U500, with doses of 60 units in the morning, 40 units at lunch, and 50-55 units at night. She recently started using a Dexcom 7, which showes TIM over the past 14 days at only37%, High 30% of the times and very HIGH 30%; lows about 2%. She is due to see her endocrinologist next week for further management. She recently saw a nutritionist and has made positive changes to her diet, including eating more baked foods and vegetables, and primarily drinking water or diet sodas. She is also exercising regularly, going to the gym three times a week to walk on the  treadmill for 20 minutes.  MDD/GAD: Her managed by [facility] with Cymbalta, Buspar, Hydroxyzine, and Olanzapine. She reports that these medications help her depression somewhat. Still uses marijuana but has cut back significantly to once a wk, which she feels helps her deal with stress and depression.     HM:  declines PCV vaccine Patient Active Problem List   Diagnosis Date Noted   Intractable nausea and vomiting 12/23/2023   Diabetic gastroparesis (HCC) 12/22/2023   DKA, type 1 (HCC) 06/20/2023   Essential hypertension 06/20/2023   Vitamin D deficiency 06/20/2023   Hyperlipidemia 03/06/2023   Primary insomnia 12/27/2022   Gallstone pancreatitis 12/03/2022   Chronic cholecystitis with calculus 12/03/2022   Morbid obesity (HCC) 04/04/2022   Former smoker 04/30/2021   Major depressive disorder, recurrent episode, moderate (HCC) 04/30/2021   Substance induced mood disorder (HCC) 03/22/2021   Generalized anxiety disorder 03/22/2021   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   DKA (diabetic ketoacidosis) (HCC) 01/23/2021   Glaucoma suspect 04/12/2020   DKA, type 2, not at goal [facility]) 10/02/2019   AKI (acute kidney injury) (HCC) 10/02/2019   Hyperkalemia 10/02/2019   Leukocytosis 10/02/2019   Abnormal LFTs 10/02/2019   Stressful life events affecting family and household 08/06/2019   New onset type 2 diabetes mellitus (HCC) 02/04/2019   Morbid obesity with BMI of 40.0-44.9, adult (HCC) 02/04/2019   Tobacco dependence 02/04/2019   Left arm weakness 12/06/2014   Paresthesias/numbness 12/06/2014   Tobacco abuse 11/14/2014   Depression    Mixed incontinence 06/27/2014  History of TVH in 2006 for fibroids and menorrhagia; benign pathology 09/08/2011     Current Outpatient Medications on File Prior to Visit  Medication Sig Dispense Refill   Accu-Chek Softclix Lancets lancets Use to check blood sugar 3 times daily 100 each 6   albuterol (VENTOLIN HFA) 108 (90 Base) MCG/ACT  inhaler Inhale 2 puffs into the lungs every 6 (six) hours as needed for wheezing or shortness of breath (Cough). 18 g 0   amLODipine (NORVASC) 10 MG tablet Take 1 tablet (10 mg total) by mouth daily. 90 tablet 2   atorvastatin (LIPITOR) 40 MG tablet Take 1 tablet (40 mg total) by mouth daily. 90 tablet 2   Blood Glucose Monitoring Suppl (ACCU-CHEK GUIDE) w/Device KIT Use to check blood sugar 3 times daily. 1 kit 0   busPIRone (BUSPAR) 15 MG tablet Take 15 mg by mouth 3 (three) times daily.     Continuous Glucose Sensor (DEXCOM G7 SENSOR) MISC Change sensor every 10 days. 9 each 1   cycloSPORINE (RESTASIS) 0.05 % ophthalmic emulsion Apply 1 drop into both eyes twice a day 180 each 3   DULoxetine (CYMBALTA) 20 MG capsule Take 20 mg by mouth every morning.     DULoxetine (CYMBALTA) 60 MG capsule Take 1 capsule (60 mg total) by mouth daily. 30 capsule 3   Erenumab-aooe (AIMOVIG) 140 MG/ML SOAJ Inject 140 mg into the skin every 28 (twenty-eight) days. 1.12 mL 5   gabapentin (NEURONTIN) 300 MG capsule Take 1 capsule (300 mg total) by mouth 3 (three) times daily. 90 capsule 3   Glucagon (BAQSIMI ONE PACK) 3 MG/DOSE POWD Place 1 Device into the nose as needed (Low blood sugar with impaired consciousness). 2 each 3   glucose blood (ACCU-CHEK GUIDE) test strip Use to check blood sugar 3 times daily 100 each 6   hydrOXYzine (ATARAX) 50 MG tablet Take 1 tablet (50 mg total) by mouth 3 (three) times daily. 90 tablet 3   Insulin Pen Needle (PEN NEEDLES 3/16") 31G X 5 MM MISC Use as directed with insulin pen 100 each 2   insulin regular human CONCENTRATED (HUMULIN R U-500 KWIKPEN) 500 UNIT/ML KwikPen Inject 60 Units into the skin daily before breakfast AND 40 Units daily before lunch AND 50 Units daily before supper. Take 30 minutes before meals. (Patient taking differently: Inject 60 Units into the skin daily before breakfast AND 40 Units daily before lunch AND 50-55 Units daily before supper. Take 30 minutes  before meals.) 12 mL 3   metoCLOPramide (REGLAN) 10 MG tablet Take 1 tablet (10 mg total) by mouth 3 (three) times daily before meals. (Patient taking differently: Take 10 mg by mouth in the morning.) 90 tablet 0   metoprolol tartrate (LOPRESSOR) 25 MG tablet Take 12.5 mg by mouth 2 (two) times daily.     Multiple Vitamins-Minerals (EYE VITAMINS PO) Take 1 tablet by mouth daily.     OLANZapine (ZYPREXA) 15 MG tablet Take 15 mg by mouth at bedtime.     omeprazole (PRILOSEC) 40 MG capsule TAKE 1 CAPSULE BY MOUTH ONCE DAILY 30 MINUTES BEFORE BREAKFAST 90 capsule 2   ondansetron (ZOFRAN-ODT) 4 MG disintegrating tablet Take 1 tablet (4 mg total) by mouth every 8 (eight) hours as needed. 20 tablet 0   SUMAtriptan (IMITREX) 100 MG tablet TAKE 1 TABLET EARLIEST ONSET OF MIGRAINE. MAY REPEAT IN 2 HOURS IF HEADACHE PERSISTS OR RECURS. MAXIMUM 2 TABLETS IN 24 HOURS. 10 tablet 5   traZODone (DESYREL) 50  MG tablet Take 1-2 tablets (50-100 mg total) by mouth at bedtime as needed for sleep. 60 tablet 3   Vitamin D, Cholecalciferol, 10 MCG (400 UNIT) CAPS Take 400 Int'l Units/day by mouth daily. 100 capsule 1   [DISCONTINUED] cetirizine (ZYRTEC) 10 MG tablet Take 1 tablet (10 mg total) by mouth daily. (Patient not taking: No sig reported) 30 tablet 1   No current facility-administered medications on file prior to visit.    Allergies  Allergen Reactions   Aspirin Hives   Trulicity [Dulaglutide] Other (See Comments)    DC'd due to chronic gastric and abdominal pain with nausea   Oxycodone Nausea And Vomiting    Social History   Socioeconomic History   Marital status: Significant Other    Spouse name: Not on file   Number of children: 3   Years of education: 14   Highest education level: Not on file  Occupational History   Not on file  Tobacco Use   Smoking status: Former    Current packs/day: 0.00    Average packs/day: 0.3 packs/day for 8.0 years (2.0 ttl pk-yrs)    Types: Cigarettes    Start  date: 2013    Quit date: 2021    Years since quitting: 4.0   Smokeless tobacco: Never  Vaping Use   Vaping status: Never Used  Substance and Sexual Activity   Alcohol use: Not Currently    Comment: occasional   Drug use: Yes    Types: Marijuana    Comment: occ   Sexual activity: Yes    Birth control/protection: Surgical  Other Topics Concern   Not on file  Social History Narrative   Patient lives at home with mother and father , one story    Patient has 3 children    Patient is single   Patient has 14 years of education    Patient is right handed    Caffeine none   Social Drivers of Corporate investment banker Strain: Not on file  Food Insecurity: No Food Insecurity (12/23/2023)   Hunger Vital Sign    Worried About Running Out of Food in the Last Year: Never true    Ran Out of Food in the Last Year: Never true  Transportation Needs: No Transportation Needs (12/23/2023)   PRAPARE - Administrator, Civil Service (Medical): No    Lack of Transportation (Non-Medical): No  Physical Activity: Not on file  Stress: Not on file  Social Connections: Unknown (12/23/2023)   Social Connection and Isolation Panel [NHANES]    Frequency of Communication with Friends and Family: More than three times a week    Frequency of Social Gatherings with Friends and Family: More than three times a week    Attends Religious Services: More than 4 times per year    Active Member of [employer] or Organizations: Yes    Attends Banker Meetings: More than 4 times per year    Marital Status: Patient declined  Intimate Partner Violence: Not At Risk (12/23/2023)   Humiliation, Afraid, Rape, and Kick questionnaire    Fear of Current or Ex-Partner: No    Emotionally Abused: No    Physically Abused: No    Sexually Abused: No    Family History  Problem Relation Age of Onset   Diabetes Mother    Mental illness Mother    Depression Mother    Hypertension Mother    Colon cancer  Father 54   Hypertension Father  Diabetes Father    Colon cancer Paternal Grandmother    Esophageal cancer Neg Hx    Stomach cancer Neg Hx    Rectal cancer Neg Hx     Past Surgical History:  Procedure Laterality Date   CESAREAN SECTION     UPPER GASTROINTESTINAL ENDOSCOPY     VAGINAL HYSTERECTOMY  12/23/2004   Fibroids, menorrhagia, benign pathology    ROS: Review of Systems Negative except as stated above  PHYSICAL EXAM: BP 106/71 (BP Location: Left Arm, Patient Position: Sitting, Cuff Size: Large)   Pulse 79   Temp 98.1 F (36.7 C) (Oral)   Ht 5\' 3"  (1.6 m)   Wt 234 lb (106.1 kg)   SpO2 97%   BMI 41.45 kg/m   Physical Exam  General appearance - alert, well appearing, morbidly obese middle-age African-American female and in no distress Mental status - normal mood, behavior, speech, dress, motor activity, and thought processes Neck - supple, no significant adenopathy Chest - clear to auscultation, no wheezes, rales or rhonchi, symmetric air entry Heart - normal rate, regular rhythm, normal S1, S2, no murmurs, rubs, clicks or gallops Extremities - peripheral pulses normal, no pedal edema, no clubbing or cyanosis     01/08/2024   10:14 AM 11/18/2023   12:27 PM 10/23/2023    2:46 PM  Depression screen PHQ 2/9  Decreased Interest 3  1  Down, Depressed, Hopeless 3  1  PHQ - 2 Score 6  2  Altered sleeping 3  3  Tired, decreased energy 3  3  Change in appetite 3  3  Feeling bad or failure about yourself  3  2  Trouble concentrating 3  3  Moving slowly or fidgety/restless 0  0  Suicidal thoughts 0  0  PHQ-9 Score 21  16  Difficult doing work/chores Somewhat difficult       Information is confidential and restricted. Go to Review Flowsheets to unlock data.       Latest Ref Rng & Units 12/24/2023    4:05 AM 12/23/2023    5:48 AM 12/23/2023   12:57 AM  CMP  Glucose 70 - 99 mg/dL 74  409    BUN 6 - 20 mg/dL 19  12    Creatinine 8.11 - 1.00 mg/dL 9.14  7.82   9.56   Sodium 135 - 145 mmol/L 141  131    Potassium 3.5 - 5.1 mmol/L 3.8  3.9    Chloride 98 - 111 mmol/L 108  101    CO2 22 - 32 mmol/L 27  19    Calcium 8.9 - 10.3 mg/dL 9.2  8.4    Total Protein 6.5 - 8.1 g/dL 7.3     Total Bilirubin 0.0 - 1.2 mg/dL 0.4     Alkaline Phos 38 - 126 U/L 95     AST 15 - 41 U/L 21     ALT 0 - 44 U/L 22      Lipid Panel     Component Value Date/Time   CHOL 224 (H) 11/17/2023 1047   CHOL 177 11/30/2021 1039   TRIG 163 (H) 11/17/2023 1047   HDL 48 (L) 11/17/2023 1047   HDL 40 11/30/2021 1039   CHOLHDL 4.7 11/17/2023 1047   VLDL 19 11/14/2014 0707   LDLCALC 146 (H) 11/17/2023 1047    CBC    Component Value Date/Time   WBC 7.3 12/24/2023 0405   RBC 4.27 12/24/2023 0405   HGB 12.1 12/24/2023 0405  HGB 13.3 12/12/2022 1036   HCT 36.8 12/24/2023 0405   HCT 40.4 12/12/2022 1036   PLT 408 (H) 12/24/2023 0405   PLT 436 12/12/2022 1036   MCV 86.2 12/24/2023 0405   MCV 86 12/12/2022 1036   MCH 28.3 12/24/2023 0405   MCHC 32.9 12/24/2023 0405   RDW 14.3 12/24/2023 0405   RDW 12.9 12/12/2022 1036   LYMPHSABS 1.1 06/20/2023 1122   LYMPHSABS 2.4 07/14/2019 1058   MONOABS 0.6 06/20/2023 1122   EOSABS 0.0 06/20/2023 1122   EOSABS 0.1 07/14/2019 1058   BASOSABS 0.0 06/20/2023 1122   BASOSABS 0.0 07/14/2019 1058    ASSESSMENT AND PLAN: 1. Type 1 diabetes mellitus with morbid obesity (HCC) (Primary) 2. Insulin long-term use (HCC) Encouraged her to continue to be compliant with taking her insulin.  Keep follow-up appointment next week with her endocrinologist.  Encourage healthy eating habits.  Encouraged her to continue exercising regularly.  3. Diabetic gastroparesis (HCC) Patient on Reglan 10 mg 3 times a day with meals which she finds helpful.  She is aware that long-term use of this medication can sometimes cause movement disorder.  Will refer to GI to see if there are any newer options available - Ambulatory referral to Gastroenterology  4.  Major depressive disorder, recurrent episode, moderate (HCC) Plugged in with behavioral health.  5. Substance induced mood disorder (HCC) Commended her on cutting back on marijuana use.  6. Pneumococcal vaccination declined Recommended.  Patient declined.    Patient was given the opportunity to ask questions.  Patient verbalized understanding of the plan and was able to repeat key elements of the plan.   This documentation was completed using Paediatric nurse.  Any transcriptional errors are unintentional.  Orders Placed This Encounter  Procedures   Ambulatory referral to Gastroenterology     Requested Prescriptions    No prescriptions requested or ordered in this encounter    Return in about 4 months (around 05/07/2024).  Jonah Blue, MD, FACP

## 2024-01-13 ENCOUNTER — Ambulatory Visit (INDEPENDENT_AMBULATORY_CARE_PROVIDER_SITE_OTHER): Payer: Medicaid Other | Admitting: "Endocrinology

## 2024-01-13 ENCOUNTER — Encounter: Payer: Self-pay | Admitting: "Endocrinology

## 2024-01-13 ENCOUNTER — Other Ambulatory Visit: Payer: Self-pay

## 2024-01-13 VITALS — BP 124/70 | HR 83 | Ht 63.0 in | Wt 237.4 lb

## 2024-01-13 DIAGNOSIS — E782 Mixed hyperlipidemia: Secondary | ICD-10-CM | POA: Diagnosis not present

## 2024-01-13 DIAGNOSIS — Z794 Long term (current) use of insulin: Secondary | ICD-10-CM

## 2024-01-13 DIAGNOSIS — E1065 Type 1 diabetes mellitus with hyperglycemia: Secondary | ICD-10-CM

## 2024-01-13 NOTE — Progress Notes (Signed)
Outpatient Endocrinology Note Whitney Nicollet, MD  01/13/24   Whitney Benton 24-Mar-1971 [id number]  Referring Provider: Marcine Matar, MD Primary Care Provider: Marcine Matar, MD Reason for consultation: Subjective   Assessment & Plan  Diagnoses and all orders for this visit:  Uncontrolled type 1 diabetes mellitus with hyperglycemia (HCC)  Long-term insulin use (HCC)  Mixed hypercholesterolemia and hypertriglyceridemia   Diabetes Type I complicated by hyperglycemia, c-peptide -ve, GAD Ab + Lab Results  Component Value Date   GFR 78.96 10/09/2022   Hba1c goal less than 7, current Hba1c is  Lab Results  Component Value Date   HGBA1C 10.3 (H) 12/23/2023   Will recommend the following: U500 70 units before break fast, 50 units before lunch and 50-55 units before supper, 30 min before meals  Stopped Metformin XR 500 mg bid - max tolerated dose. Patient did not experience any benefit from, only GI S/E  Changed to DexCom G7 from [place] 3 per pt request as Whitney Benton keeps falling off, pt unable to afford skin tac Stopped Toujeo 78 units qam and Humalog 26 units tidac 15 min before meals Was taken off of Trulicity due to stomach issues  No known contraindications/side effects to any of above medications Glucagon discussed and prescribed with refills on 11/24/23  -Last LD and Tg are as follows: Lab Results  Component Value Date   LDLCALC 146 (H) 11/17/2023    Lab Results  Component Value Date   TRIG 163 (H) 11/17/2023   -On atorvastatin 40 mg every day, compliant now, repeat labs in 3 months -Follow low fat diet and exercise   -Blood pressure goal <140/90 - Microalbumin/creatinine goal is < 30 -Last MA/Cr is as follows: Lab Results  Component Value Date   MICROALBUR 0.6 11/17/2023   -not on ACE/ARB  -diet changes including salt restriction -limit eating outside -counseled BP targets per standards of diabetes care -uncontrolled blood pressure can lead  to retinopathy, nephropathy and cardiovascular and atherosclerotic heart disease  Reviewed and counseled on: -A1C target -Blood sugar targets -Complications of uncontrolled diabetes  -Checking blood sugar before meals and bedtime and bring log next visit -All medications with mechanism of action and side effects -Hypoglycemia management: rule of 15's, Glucagon Emergency Kit and medical alert ID -low-carb low-fat plate-method diet -At least 20 minutes of physical activity per day -Annual dilated retinal eye exam and foot exam -compliance and follow up needs -follow up as scheduled or earlier if problem gets worse  Call if blood sugar is less than 70 or consistently above 250    Take a 15 gm snack of carbohydrate at bedtime before you go to sleep if your blood sugar is less than 100.    If you are going to fast after midnight for a test or procedure, ask your physician for instructions on how to reduce/decrease your insulin dose.    Call if blood sugar is less than 70 or consistently above 250  -Treating a low sugar by rule of 15  (15 gms of sugar every 15 min until sugar is more than 70) If you feel your sugar is low, test your sugar to be sure If your sugar is low (less than 70), then take 15 grams of a fast acting Carbohydrate (3-4 glucose tablets or glucose gel or 4 ounces of juice or regular soda) Recheck your sugar 15 min after treating low to make sure it is more than 70 If sugar is still less than 70, treat  again with 15 grams of carbohydrate          Don't drive the hour of hypoglycemia  If unconscious/unable to eat or drink by mouth, use glucagon injection or nasal spray baqsimi and call 911. Can repeat again in 15 min if still unconscious.  Return in about 22 days (around 02/04/2024).   I have reviewed current medications, nurse's notes, allergies, vital signs, past medical and surgical history, family medical history, and social history for this encounter. Counseled patient  on symptoms, examination findings, lab findings, imaging results, treatment decisions and monitoring and prognosis. The patient understood the recommendations and agrees with the treatment plan. All questions regarding treatment plan were fully answered.  Whitney Coppock, MD  01/13/24    History of Present Illness Whitney Benton is a 53 y.o. year old female who presents for follow up of Type I diabetes mellitus.  Whitney Benton was first diagnosed in 2020.   Diabetes education +  Home diabetes regimen: U500 60 units before break fast, 40 units before lunch and 50-55 units before supper, 30 min before meals  Previously on, Toujeo 72 units qam Humalog 24 units tidac 15 min before meals Metformin XR 500 mg bid - gets diarrhea   COMPLICATIONS -  MI/Stroke -  retinopathy -  neuropathy -  nephropathy  BLOOD SUGAR DATA  CGM interpretation: At today's visit, we reviewed her CGM downloads. The full report is scanned in the media. Reviewing the CGM trends, BG are elevated all day.   Physical Exam  BP 124/70   Pulse 83   Ht 5\' 3"  (1.6 m)   Wt 237 lb 6.4 oz (107.7 kg)   SpO2 96%   BMI 42.05 kg/m    Constitutional: well developed, well nourished Head: normocephalic, atraumatic Eyes: sclera anicteric, no redness Neck: supple Lungs: normal respiratory effort Neurology: alert and oriented Skin: dry, no appreciable rashes Musculoskeletal: no appreciable defects Psychiatric: normal mood and affect Diabetic Foot Exam - Simple   No data filed      Current Medications Patient's Medications  New Prescriptions   No medications on file  Previous Medications   ACCU-CHEK SOFTCLIX LANCETS LANCETS    Use to check blood sugar 3 times daily   ALBUTEROL (VENTOLIN HFA) 108 (90 BASE) MCG/ACT INHALER    Inhale 2 puffs into the lungs every 6 (six) hours as needed for wheezing or shortness of breath (Cough).   AMLODIPINE (NORVASC) 10 MG TABLET    Take 1 tablet (10 mg total) by mouth daily.    ATORVASTATIN (LIPITOR) 40 MG TABLET    Take 1 tablet (40 mg total) by mouth daily.   BLOOD GLUCOSE MONITORING SUPPL (ACCU-CHEK GUIDE) W/DEVICE KIT    Use to check blood sugar 3 times daily.   BUSPIRONE (BUSPAR) 15 MG TABLET    Take 15 mg by mouth 3 (three) times daily.   CONTINUOUS GLUCOSE SENSOR (DEXCOM G7 SENSOR) MISC    Change sensor every 10 days.   CYCLOSPORINE (RESTASIS) 0.05 % OPHTHALMIC EMULSION    Apply 1 drop into both eyes twice a day   DULOXETINE (CYMBALTA) 20 MG CAPSULE    Take 20 mg by mouth every morning.   DULOXETINE (CYMBALTA) 60 MG CAPSULE    Take 1 capsule (60 mg total) by mouth daily.   ERENUMAB-AOOE (AIMOVIG) 140 MG/ML SOAJ    Inject 140 mg into the skin every 28 (twenty-eight) days.   GABAPENTIN (NEURONTIN) 300 MG CAPSULE    Take 1 capsule (  300 mg total) by mouth 3 (three) times daily.   GLUCAGON (BAQSIMI ONE PACK) 3 MG/DOSE POWD    Place 1 Device into the nose as needed (Low blood sugar with impaired consciousness).   GLUCOSE BLOOD (ACCU-CHEK GUIDE) TEST STRIP    Use to check blood sugar 3 times daily   HYDROXYZINE (ATARAX) 50 MG TABLET    Take 1 tablet (50 mg total) by mouth 3 (three) times daily.   INSULIN PEN NEEDLE (PEN NEEDLES 3/16") 31G X 5 MM MISC    Use as directed with insulin pen   INSULIN REGULAR HUMAN CONCENTRATED (HUMULIN R U-500 KWIKPEN) 500 UNIT/ML KWIKPEN    Inject 60 Units into the skin daily before breakfast AND 40 Units daily before lunch AND 50 Units daily before supper. Take 30 minutes before meals.   METOCLOPRAMIDE (REGLAN) 10 MG TABLET    Take 1 tablet (10 mg total) by mouth 3 (three) times daily before meals.   METOPROLOL TARTRATE (LOPRESSOR) 25 MG TABLET    Take 12.5 mg by mouth 2 (two) times daily.   MULTIPLE VITAMINS-MINERALS (EYE VITAMINS PO)    Take 1 tablet by mouth daily.   OLANZAPINE (ZYPREXA) 15 MG TABLET    Take 15 mg by mouth at bedtime.   OMEPRAZOLE (PRILOSEC) 40 MG CAPSULE    TAKE 1 CAPSULE BY MOUTH ONCE DAILY 30 MINUTES BEFORE BREAKFAST    ONDANSETRON (ZOFRAN-ODT) 4 MG DISINTEGRATING TABLET    Take 1 tablet (4 mg total) by mouth every 8 (eight) hours as needed.   SUMATRIPTAN (IMITREX) 100 MG TABLET    TAKE 1 TABLET EARLIEST ONSET OF MIGRAINE. MAY REPEAT IN 2 HOURS IF HEADACHE PERSISTS OR RECURS. MAXIMUM 2 TABLETS IN 24 HOURS.   TRAZODONE (DESYREL) 50 MG TABLET    Take 1-2 tablets (50-100 mg total) by mouth at bedtime as needed for sleep.   VITAMIN D, CHOLECALCIFEROL, 10 MCG (400 UNIT) CAPS    Take 400 Int'l Units/day by mouth daily.  Modified Medications   No medications on file  Discontinued Medications   No medications on file    Allergies Allergies  Allergen Reactions   Aspirin Hives   Trulicity [Dulaglutide] Other (See Comments)    DC'd due to chronic gastric and abdominal pain with nausea   Oxycodone Nausea And Vomiting    Past Medical History Past Medical History:  Diagnosis Date   Allergy    Anxiety    Arthritis    Asthma    Depression    Diabetes mellitus without complication (HCC)    GERD (gastroesophageal reflux disease)    Glaucoma suspect    Hyperlipidemia    Trichomonas infection     Past Surgical History Past Surgical History:  Procedure Laterality Date   CESAREAN SECTION     UPPER GASTROINTESTINAL ENDOSCOPY     VAGINAL HYSTERECTOMY  12/23/2004   Fibroids, menorrhagia, benign pathology    Family History family history includes Colon cancer in her paternal grandmother; Colon cancer (Benton of onset: 48) in her father; Depression in her mother; Diabetes in her father and mother; Hypertension in her father and mother; Mental illness in her mother.  Social History Social History   Socioeconomic History   Marital status: Significant Other    Spouse name: Not on file   Number of children: 3   Years of education: 14   Highest education level: Not on file  Occupational History   Not on file  Tobacco Use   Smoking status: Former  Current packs/day: 0.00    Average packs/day: 0.3  packs/day for 8.0 years (2.0 ttl pk-yrs)    Types: Cigarettes    Start date: 2013    Quit date: 2021    Years since quitting: 4.0   Smokeless tobacco: Never  Vaping Use   Vaping status: Never Used  Substance and Sexual Activity   Alcohol use: Not Currently    Comment: occasional   Drug use: Yes    Types: Marijuana    Comment: occ   Sexual activity: Yes    Birth control/protection: Surgical  Other Topics Concern   Not on file  Social History Narrative   Patient lives at home with mother and father , one story    Patient has 3 children    Patient is single   Patient has 14 years of education    Patient is right handed    Caffeine none   Social Drivers of Corporate investment banker Strain: Not on file  Food Insecurity: No Food Insecurity (12/23/2023)   Hunger Vital Sign    Worried About Running Out of Food in the Last Year: Never true    Ran Out of Food in the Last Year: Never true  Transportation Needs: No Transportation Needs (12/23/2023)   PRAPARE - Administrator, Civil Service (Medical): No    Lack of Transportation (Non-Medical): No  Physical Activity: Not on file  Stress: Not on file  Social Connections: Unknown (12/23/2023)   Social Connection and Isolation Panel [NHANES]    Frequency of Communication with Friends and Family: More than three times a week    Frequency of Social Gatherings with Friends and Family: More than three times a week    Attends Religious Services: More than 4 times per year    Active Member of Clubs or Organizations: Yes    Attends Banker Meetings: More than 4 times per year    Marital Status: Patient declined  Intimate Partner Violence: Not At Risk (12/23/2023)   Humiliation, Afraid, Rape, and Kick questionnaire    Fear of Current or Ex-Partner: No    Emotionally Abused: No    Physically Abused: No    Sexually Abused: No    Lab Results  Component Value Date   HGBA1C 10.3 (H) 12/23/2023   HGBA1C 9.9 (H)  11/17/2023   HGBA1C 10.5 (A) 09/08/2023   Lab Results  Component Value Date   CHOL 224 (H) 11/17/2023   Lab Results  Component Value Date   HDL 48 (L) 11/17/2023   Lab Results  Component Value Date   LDLCALC 146 (H) 11/17/2023   Lab Results  Component Value Date   TRIG 163 (H) 11/17/2023   Lab Results  Component Value Date   CHOLHDL 4.7 11/17/2023   Lab Results  Component Value Date   CREATININE 1.06 (H) 12/24/2023   Lab Results  Component Value Date   GFR 78.96 10/09/2022   Lab Results  Component Value Date   MICROALBUR 0.6 11/17/2023      Component Value Date/Time   NA 141 12/24/2023 0405   NA 140 12/12/2022 1036   K 3.8 12/24/2023 0405   CL 108 12/24/2023 0405   CO2 27 12/24/2023 0405   GLUCOSE 74 12/24/2023 0405   BUN 19 12/24/2023 0405   BUN 10 12/12/2022 1036   CREATININE 1.06 (H) 12/24/2023 0405   CREATININE 0.90 11/17/2023 1047   CALCIUM 9.2 12/24/2023 0405   PROT 7.3 12/24/2023 0405  PROT 7.0 11/30/2021 1039   ALBUMIN 3.7 12/24/2023 0405   ALBUMIN 4.4 11/30/2021 1039   AST 21 12/24/2023 0405   ALT 22 12/24/2023 0405   ALKPHOS 95 12/24/2023 0405   BILITOT 0.4 12/24/2023 0405   BILITOT 0.3 11/30/2021 1039   GFRNONAA >60 12/24/2023 0405   GFRAA >60 08/10/2020 2100      Latest Ref Rng & Units 12/24/2023    4:05 AM 12/23/2023    5:48 AM 12/23/2023   12:57 AM  BMP  Glucose 70 - 99 mg/dL 74  161    BUN 6 - 20 mg/dL 19  12    Creatinine 0.96 - 1.00 mg/dL 0.45  4.09  8.11   Sodium 135 - 145 mmol/L 141  131    Potassium 3.5 - 5.1 mmol/L 3.8  3.9    Chloride 98 - 111 mmol/L 108  101    CO2 22 - 32 mmol/L 27  19    Calcium 8.9 - 10.3 mg/dL 9.2  8.4         Component Value Date/Time   WBC 7.3 12/24/2023 0405   RBC 4.27 12/24/2023 0405   HGB 12.1 12/24/2023 0405   HGB 13.3 12/12/2022 1036   HCT 36.8 12/24/2023 0405   HCT 40.4 12/12/2022 1036   PLT 408 (H) 12/24/2023 0405   PLT 436 12/12/2022 1036   MCV 86.2 12/24/2023 0405   MCV 86  12/12/2022 1036   MCH 28.3 12/24/2023 0405   MCHC 32.9 12/24/2023 0405   RDW 14.3 12/24/2023 0405   RDW 12.9 12/12/2022 1036   LYMPHSABS 1.1 06/20/2023 1122   LYMPHSABS 2.4 07/14/2019 1058   MONOABS 0.6 06/20/2023 1122   EOSABS 0.0 06/20/2023 1122   EOSABS 0.1 07/14/2019 1058   BASOSABS 0.0 06/20/2023 1122   BASOSABS 0.0 07/14/2019 1058     Parts of this note may have been dictated using voice recognition software. There may be variances in spelling and vocabulary which are unintentional. Not all errors are proofread. Please notify the Thereasa Parkin if any discrepancies are noted or if the meaning of any statement is not clear.

## 2024-01-13 NOTE — Patient Instructions (Addendum)
Will recommend the following: U500 70 units before break fast, 50 units before lunch and 50-55 units before supper, 30 min before meals  ______________ Goals of DM therapy:  Morning Fasting blood sugar: 80-140  Blood sugar before meals: 80-140 Bed time blood sugar: 100-150  A1C <7%, limited only by hypoglycemia  1.Diabetes medications and their side effects discussed, including hypoglycemia    2. Check blood glucose:  a) Always check blood sugars before driving. Please see below (under hypoglycemia) on how to manage b) Check a minimum of 3 times/day or more as needed when having symptoms of hypoglycemia.   c) Try to check blood glucose before sleeping/in the middle of the night to ensure that it is remaining stable and not dropping less than 100 d) Check blood glucose more often if sick  3. Diet: a) 3 meals per day schedule b: Restrict carbs to 60-70 grams (4 servings) per meal c) Colorful vegetables - 3 servings a day, and low sugar fruit 2 servings/day Plate control method: 1/4 plate protein, 1/4 starch, 1/2 green, yellow, or red vegetables d) Avoid carbohydrate snacks unless hypoglycemic episode, or increased physical activity  4. Regular exercise as tolerated, preferably 3 or more hours a week  5. Hypoglycemia: a)  Do not drive or operate machinery without first testing blood glucose to assure it is over 90 mg%, or if dizzy, lightheaded, not feeling normal, etc, or  if foot or leg is numb or weak. b)  If blood glucose less than 70, take four 5gm Glucose tabs or 15-30 gm Glucose gel.  Repeat every 15 min as needed until blood sugar is >100 mg/dl. If hypoglycemia persists then call 911.   6. Sick day management: a) Check blood glucose more often b) Continue usual therapy if blood sugars are elevated.   7. Contact the doctor immediately if blood glucose is frequently <60 mg/dl, or an episode of severe hypoglycemia occurs (where someone had to give you glucose/  glucagon or if  you passed out from a low blood glucose), or if blood glucose is persistently >350 mg/dl, for further management  8. A change in level of physical activity or exercise and a change in diet may also affect your blood sugar. Check blood sugars more often and call if needed.  Instructions: 1. Bring glucose meter, blood glucose records on every visit for review 2. Continue to follow up with primary care physician and other providers for medical care 3. Yearly eye  and foot exam 4. Please get blood work done prior to the next appointment

## 2024-01-15 ENCOUNTER — Ambulatory Visit: Payer: Medicaid Other | Attending: Internal Medicine | Admitting: Pharmacist

## 2024-01-15 ENCOUNTER — Encounter: Payer: Self-pay | Admitting: Pharmacist

## 2024-01-15 DIAGNOSIS — Z794 Long term (current) use of insulin: Secondary | ICD-10-CM | POA: Diagnosis not present

## 2024-01-15 DIAGNOSIS — E1065 Type 1 diabetes mellitus with hyperglycemia: Secondary | ICD-10-CM

## 2024-01-15 NOTE — Progress Notes (Signed)
S:     No chief complaint on file.  53 y.o. female who presents for diabetes evaluation, education, and management. Patient was referred and last seen by Primary Care Provider, Dr. Laural Benes, on 01/08/2024. Patient was last seen by Endocrine on 01/13/2024. Most recent A1c was 10.3 in December, up from 9.9 prior.   PMH is significant for T1DM (LADA) HTN, MDD, migraines, obesity, HLD, vit d def, non-obstructive CAD.   Today, patient arrives in good spirits and presents without any assistance. Since last visit with me, Endocrine is manageing her DM with U500 R. She is taking this TID before meals as prescribed. She has switched form [employer] to [employer] and brings in her Clarity app for review today.  Family/Social History:  Sxh: Former Smoker. Occasional marijuana use, Occasional Drinking Fhx: Mother DM, HTN, Depression. Father: DM, HTN  Current diabetes medications include: insulin R u500; takes 70 units before breakfast, 50 units before lunch, and 50-55 units before dinner.  Patient reports adherence to taking all medications as prescribed.   Insurance coverage: [place] medicaid  Patient denies hypoglycemic events.  Patient denies nocturia (nighttime urination).  Patient denies neuropathy (nerve pain). Patient denies visual changes. Patient denies self foot exams.   Patient reported dietary habits:  -endorses less fried foods & salt intake, more baked foods and vegetables  Patient-reported exercise habits: not discussed this visit  O:   ROS  Physical Exam  Clarity G7 CGM Download today. 3-day report since last Endo visit earlier this week Average Glucose: 180 mg/dL Glucose Management Indicator: 8.3 (from 14 day data) - Time in range 70-180: 58% - Time above range: 39% - Time below range: 3% Observed patterns  Lab Results  Component Value Date   HGBA1C 10.3 (H) 12/23/2023   There were no vitals filed for this visit.  Lipid Panel     Component Value Date/Time   CHOL  224 (H) 11/17/2023 1047   CHOL 177 11/30/2021 1039   TRIG 163 (H) 11/17/2023 1047   HDL 48 (L) 11/17/2023 1047   HDL 40 11/30/2021 1039   CHOLHDL 4.7 11/17/2023 1047   VLDL 19 11/14/2014 0707   LDLCALC 146 (H) 11/17/2023 1047    Clinical Atherosclerotic Cardiovascular Disease (ASCVD): No  The 10-year ASCVD risk score (Arnett DK, et al., 2019) is: 11.4%   Values used to calculate the score:     Age: 51 years     Sex: Female     Is Non-Hispanic African American: Yes     Diabetic: Yes     Tobacco smoker: No     Systolic Blood Pressure: 124 mmHg     Is BP treated: Yes     HDL Cholesterol: 48 mg/dL     Total Cholesterol: 224 mg/dL   Patient is participating in a Managed Medicaid Plan:  Yes   A/P: Diabetes longstanding currently uncontrolled based on most recent A1c, however, CGM shows improvement since starting and titrating u500 R with Endo. Patient is able to verbalize appropriate hypoglycemia management plan. Medication adherence appears appropriate.  -Continued current insulin regimen. Commended patient on her improvement and encouraged adherence.  -Continue monitoring home CBGs with Dexcom G7 -Patient educated on purpose, proper use, and potential adverse effects of insulin.  -Extensively discussed pathophysiology of diabetes, recommended lifestyle interventions, dietary effects on blood sugar control.  -Counseled on s/sx of and management of hypoglycemia.  -Next A1c anticipated 02/2024.   Written patient instructions provided. Patient verbalized understanding of treatment plan.  Total time in face to face counseling 30 minutes.    Follow-up:  Pharmacist 1 month  Butch Penny, PharmD, [place], CPP Clinical Pharmacist [facility] [phone]

## 2024-01-20 ENCOUNTER — Other Ambulatory Visit: Payer: Self-pay

## 2024-01-27 ENCOUNTER — Ambulatory Visit (INDEPENDENT_AMBULATORY_CARE_PROVIDER_SITE_OTHER): Payer: Medicaid Other | Admitting: Psychiatry

## 2024-01-27 ENCOUNTER — Encounter (HOSPITAL_COMMUNITY): Payer: Self-pay | Admitting: Psychiatry

## 2024-01-27 VITALS — BP 139/85 | HR 113 | Temp 99.1°F | Ht 63.0 in | Wt 242.2 lb

## 2024-01-27 DIAGNOSIS — F1290 Cannabis use, unspecified, uncomplicated: Secondary | ICD-10-CM | POA: Diagnosis not present

## 2024-01-27 DIAGNOSIS — E1169 Type 2 diabetes mellitus with other specified complication: Secondary | ICD-10-CM

## 2024-01-27 DIAGNOSIS — F411 Generalized anxiety disorder: Secondary | ICD-10-CM | POA: Diagnosis not present

## 2024-01-27 DIAGNOSIS — F333 Major depressive disorder, recurrent, severe with psychotic symptoms: Secondary | ICD-10-CM

## 2024-01-27 MED ORDER — DULOXETINE HCL 60 MG PO CPEP: 60.0000 mg | ORAL_CAPSULE | Freq: Every day | ORAL | 3 refills | Status: DC

## 2024-01-27 MED ORDER — LYBALVI 15-10 MG PO TABS: 15.0000 mg | ORAL_TABLET | Freq: Every evening | ORAL | 3 refills | Status: DC

## 2024-01-27 MED ORDER — GABAPENTIN 300 MG PO CAPS: 300.0000 mg | ORAL_CAPSULE | Freq: Three times a day (TID) | ORAL | 3 refills | Status: DC

## 2024-01-27 MED ORDER — TRAZODONE HCL 50 MG PO TABS: 50.0000 mg | ORAL_TABLET | Freq: Every evening | ORAL | 3 refills | Status: DC | PRN

## 2024-01-27 MED ORDER — DULOXETINE HCL 20 MG PO CPEP: 20.0000 mg | ORAL_CAPSULE | Freq: Every morning | ORAL | 3 refills | Status: DC

## 2024-01-27 NOTE — Progress Notes (Signed)
 BH MD/PA/NP OP Progress Note        01/27/2024 12:18 PM Whitney Benton  MRN:  [id number]  Chief Complaint: I may lose my house   HPI: 53 year old female seen today for follow up psychiatric evaluation.  She has a psychiatric history of insomnia, substance induced mood disorder, anxiety and depression.   She is currently managed on Lybalvi  15-10 mg nightly, hydroxyzine  25 mg three times daily as needed, gabapentin  300 mg 3 times daily, trazodone  50 to 100 mg nightly as needed, and Cymbalta  80 mg daily  Today she notes her medications are somewhat effective in managing her psychiatric condition.   Today was well-groomed, pleasant, cooperative, and engaged in conversation.  She informed clinical research associate that she fears that she will lose her house.  Patient notes that her house is now foreclosure.  She reports that she only owes a little above $20,000 on her home but currently is unemployed.  She notes that her sister who lives with her is unable to help.  Patient reports that her disability is under consideration however not approved at this time.  Patient informed clinical research associate that financially her sons are unable to contribute.  She notes that she plans to discuss her financial hardship with her uncle as she wants to save her home.  Patient notes that her parents gave her this home and she would hate losing it.   Since her last visit she notes that her anxiety depression has been exacerbated.  Today provider conducted a GAD-7 and patient scored a 21, at her last visit she scored an 21.  Provider also conducted PHQ-9 the patient scored an 21, at her last visit she scored a 18.  She notes that her  appetite fluctuates and weight.  Today she denies SI/HI/AH or paranoia.  Patient does note that occasionally she has visual hallucinations of shadows.  She notes that she has not been taking her Lybalvi .  Patient also reports she has been irritable, distracted, having racing thoughts, and fluctuations in mood.  She denies  cocaine use and reports that she smokes marijuana occasionally.  Provider conducted an aims assessment and patient scored a 0.  Since discontinuing Ambien  patient notes that she no longer sleepwalks.  She does note that her sleep continues to be problematic.  She only notes that she has been taking trazodone  50 mg.  Provider informed patient that she can take trazodone  50 to 100 mg.  She endorsed understanding and notes that she would start.  She will also start Lybalvi  15-10 mg nightly.  She will continue other medications as prescribed and follow-up with outpatient counseling for therapy.No other concerns noted at this time.   Visit Diagnosis:    ICD-10-CM   1. Marijuana use  F12.90     2. Severe recurrent major depressive disorder with psychotic features (HCC)  F33.3 DULoxetine  (CYMBALTA ) 60 MG capsule    DULoxetine  (CYMBALTA ) 20 MG capsule    gabapentin  (NEURONTIN ) 300 MG capsule    OLANZapine -Samidorphan (LYBALVI ) 15-10 MG TABS    traZODone  (DESYREL ) 50 MG tablet    3. Generalized anxiety disorder  F41.1 DULoxetine  (CYMBALTA ) 60 MG capsule    DULoxetine  (CYMBALTA ) 20 MG capsule    gabapentin  (NEURONTIN ) 300 MG capsule    traZODone  (DESYREL ) 50 MG tablet    4. Type 2 diabetes mellitus with morbid obesity (HCC)  E11.69    E66.01           Past Psychiatric History: anxiety and depression  Past Medical  History:  Past Medical History:  Diagnosis Date   Allergy    Anxiety    Arthritis    Asthma    Depression    Diabetes mellitus without complication (HCC)    GERD (gastroesophageal reflux disease)    Glaucoma suspect    Hyperlipidemia    Trichomonas infection     Past Surgical History:  Procedure Laterality Date   CESAREAN SECTION     UPPER GASTROINTESTINAL ENDOSCOPY     VAGINAL HYSTERECTOMY  12/23/2004   Fibroids, menorrhagia, benign pathology    Family Psychiatric History: Mother schizophrenia and bipolar disorder  Family History:  Family History  Problem  Relation Age of Onset   Diabetes Mother    Mental illness Mother    Depression Mother    Hypertension Mother    Colon cancer Father 39   Hypertension Father    Diabetes Father    Colon cancer Paternal Grandmother    Esophageal cancer Neg Hx    Stomach cancer Neg Hx    Rectal cancer Neg Hx     Social History:  Social History   Socioeconomic History   Marital status: Significant Other    Spouse name: Not on file   Number of children: 3   Years of education: 14   Highest education level: Not on file  Occupational History   Not on file  Tobacco Use   Smoking status: Former    Current packs/day: 0.00    Average packs/day: 0.3 packs/day for 8.0 years (2.0 ttl pk-yrs)    Types: Cigarettes    Start date: 2013    Quit date: 2021    Years since quitting: 4.0   Smokeless tobacco: Never  Vaping Use   Vaping status: Never Used  Substance and Sexual Activity   Alcohol use: Not Currently    Comment: occasional   Drug use: Yes    Types: Marijuana    Comment: occ   Sexual activity: Yes    Birth control/protection: Surgical  Other Topics Concern   Not on file  Social History Narrative   Patient lives at home with mother and father , one story    Patient has 3 children    Patient is single   Patient has 14 years of education    Patient is right handed    Caffeine none   Social Drivers of Corporate Investment Banker Strain: Not on file  Food Insecurity: No Food Insecurity (12/23/2023)   Hunger Vital Sign    Worried About Running Out of Food in the Last Year: Never true    Ran Out of Food in the Last Year: Never true  Transportation Needs: No Transportation Needs (12/23/2023)   PRAPARE - Administrator, Civil Service (Medical): No    Lack of Transportation (Non-Medical): No  Physical Activity: Not on file  Stress: Not on file  Social Connections: Unknown (12/23/2023)   Social Connection and Isolation Panel [NHANES]    Frequency of Communication with Friends  and Family: More than three times a week    Frequency of Social Gatherings with Friends and Family: More than three times a week    Attends Religious Services: More than 4 times per year    Active Member of [employer] or Organizations: Yes    Attends Banker Meetings: More than 4 times per year    Marital Status: Patient declined    Allergies:  Allergies  Allergen Reactions   Aspirin Hives   Trulicity  [  Dulaglutide ] Other (See Comments)    DC'd due to chronic gastric and abdominal pain with nausea   Oxycodone  Nausea And Vomiting    Metabolic Disorder Labs: Lab Results  Component Value Date   HGBA1C 10.3 (H) 12/23/2023   MPG 249 12/23/2023   MPG 237 11/17/2023   No results found for: PROLACTIN Lab Results  Component Value Date   CHOL 224 (H) 11/17/2023   TRIG 163 (H) 11/17/2023   HDL 48 (L) 11/17/2023   CHOLHDL 4.7 11/17/2023   VLDL 19 11/14/2014   LDLCALC 146 (H) 11/17/2023   LDLCALC 105 (H) 11/30/2021   Lab Results  Component Value Date   TSH 2.460 04/30/2021   TSH 0.014 (L) 10/02/2019    Therapeutic Level Labs: No results found for: LITHIUM No results found for: VALPROATE No results found for: CBMZ  Current Medications: Current Outpatient Medications  Medication Sig Dispense Refill   OLANZapine -Samidorphan (LYBALVI ) 15-10 MG TABS Take 15 mg by mouth at bedtime. 30 tablet 3   Accu-Chek Softclix Lancets lancets Use to check blood sugar 3 times daily 100 each 6   albuterol  (VENTOLIN  HFA) 108 (90 Base) MCG/ACT inhaler Inhale 2 puffs into the lungs every 6 (six) hours as needed for wheezing or shortness of breath (Cough). 18 g 0   amLODipine  (NORVASC ) 10 MG tablet Take 1 tablet (10 mg total) by mouth daily. 90 tablet 2   atorvastatin  (LIPITOR) 40 MG tablet Take 1 tablet (40 mg total) by mouth daily. 90 tablet 2   Blood Glucose Monitoring Suppl (ACCU-CHEK GUIDE) w/Device KIT Use to check blood sugar 3 times daily. 1 kit 0   busPIRone  (BUSPAR ) 15 MG  tablet Take 15 mg by mouth 3 (three) times daily.     Continuous Glucose Sensor (DEXCOM G7 SENSOR) MISC Change sensor every 10 days. 9 each 1   cycloSPORINE  (RESTASIS ) 0.05 % ophthalmic emulsion Apply 1 drop into both eyes twice a day 180 each 3   DULoxetine  (CYMBALTA ) 20 MG capsule Take 1 capsule (20 mg total) by mouth every morning. 30 capsule 3   DULoxetine  (CYMBALTA ) 60 MG capsule Take 1 capsule (60 mg total) by mouth daily. 30 capsule 3   Erenumab -aooe (AIMOVIG ) 140 MG/ML SOAJ Inject 140 mg into the skin every 28 (twenty-eight) days. 1.12 mL 5   gabapentin  (NEURONTIN ) 300 MG capsule Take 1 capsule (300 mg total) by mouth 3 (three) times daily. 90 capsule 3   Glucagon  (BAQSIMI  ONE PACK) 3 MG/DOSE POWD Place 1 Device into the nose as needed (Low blood sugar with impaired consciousness). 2 each 3   glucose blood (ACCU-CHEK GUIDE) test strip Use to check blood sugar 3 times daily 100 each 6   hydrOXYzine  (ATARAX ) 50 MG tablet Take 1 tablet (50 mg total) by mouth 3 (three) times daily. 90 tablet 3   Insulin  Pen Needle (PEN NEEDLES 3/16) 31G X 5 MM MISC Use as directed with insulin  pen 100 each 2   insulin  regular human CONCENTRATED (HUMULIN  R U-500 KWIKPEN) 500 UNIT/ML KwikPen Inject 60 Units into the skin daily before breakfast AND 40 Units daily before lunch AND 50 Units daily before supper. Take 30 minutes before meals. (Patient taking differently: Inject 60 Units into the skin daily before breakfast AND 40 Units daily before lunch AND 50-55 Units daily before supper. Take 30 minutes before meals.) 12 mL 3   metoCLOPramide  (REGLAN ) 10 MG tablet Take 1 tablet (10 mg total) by mouth 3 (three) times daily before meals. (Patient taking  differently: Take 10 mg by mouth in the morning.) 90 tablet 0   metoprolol  tartrate (LOPRESSOR ) 25 MG tablet Take 12.5 mg by mouth 2 (two) times daily.     Multiple Vitamins-Minerals (EYE VITAMINS PO) Take 1 tablet by mouth daily.     omeprazole  (PRILOSEC) 40 MG capsule  TAKE 1 CAPSULE BY MOUTH ONCE DAILY 30 MINUTES BEFORE BREAKFAST 90 capsule 2   ondansetron  (ZOFRAN -ODT) 4 MG disintegrating tablet Take 1 tablet (4 mg total) by mouth every 8 (eight) hours as needed. 20 tablet 0   SUMAtriptan  (IMITREX ) 100 MG tablet TAKE 1 TABLET EARLIEST ONSET OF MIGRAINE. MAY REPEAT IN 2 HOURS IF HEADACHE PERSISTS OR RECURS. MAXIMUM 2 TABLETS IN 24 HOURS. 10 tablet 5   traZODone  (DESYREL ) 50 MG tablet Take 1-2 tablets (50-100 mg total) by mouth at bedtime as needed for sleep. 60 tablet 3   Vitamin D , Cholecalciferol , 10 MCG (400 UNIT) CAPS Take 400 Int'l Units/day by mouth daily. 100 capsule 1   No current facility-administered medications for this visit.     Musculoskeletal: Strength & Muscle Tone: within normal limits Gait & Station: normal Patient leans: N/A  Psychiatric Specialty Exam: Review of Systems  There were no vitals taken for this visit.There is no height or weight on file to calculate BMI.  General Appearance: Well Groomed  Eye Contact:  Good  Speech:  Clear and Coherent  Volume:  Normal  Mood:  Anxious and Depressed  Affect:  Appropriate and Congruent  Thought Process:  Coherent, Goal Directed, and Linear  Orientation:  Full (Time, Place, and Person)  Thought Content: WDL and Logical,   Suicidal Thoughts:  No  Homicidal Thoughts:  No  Memory:  Immediate;   Good Recent;   Good Remote;   Good  Judgement:  Good  Insight:  Good  Psychomotor Activity:  Normal  Concentration:  Concentration: Good and Attention Span: Good  Recall:  Good  Fund of Knowledge: Good  Language: Good  Akathisia:  No  Handed:  Right  AIMS (if indicated):  done, 0  Assets:  Communication Skills Desire for Improvement Housing Intimacy Physical Health Social Support  ADL's:  Intact  Cognition: WNL  Sleep:  Poor   Screenings: GAD-7    Flowsheet Row Clinical Support from 01/27/2024 in [facility] Office Visit from 01/08/2024 in [facility] [place] - A Dept Of [place]. [facility] Clinical Support from 11/18/2023 in [facility] Office Visit from 09/08/2023 in [facility] [place] - A Dept Of [place]. [facility] Clinical [facility]  Total GAD-7 Score 21 21 21 21 18       PHQ2-9    Flowsheet Row Clinical Support from 01/27/2024 in [facility] Office Visit from 01/08/2024 in [facility] [place] - A Dept Of [place]. [facility] Clinical Support from 11/18/2023 in [facility] Nutrition from 10/23/2023 in [facility]  - A Dept Of [place]. [facility] Office Visit from 09/08/2023 in [facility] [place] - A Dept Of [place]. [facility]  PHQ-2 Total Score 6 6 6 2 6   PHQ-9 Total Score 24 21 18 16 21       Flowsheet Row ED to Hosp-Admission (Discharged) from 12/22/2023 in [place] LONG 4TH FLOOR PROGRESSIVE CARE AND UROLOGY Clinical Support from 11/18/2023 in [place]  [facility] ED from 11/12/2023 in [facility]  C-SSRS RISK CATEGORY No Risk No Risk No Risk        Assessment and Plan: Patient notes that her anxiety, depression, and sleep continues to be poor.  Since discontinuing Ambien  she notes that she no longer sleepwalks.  She has only been taking trazodone  50 mg and is agreeable to increasing to 100 mg to help manage sleep.  Patient has not been taking Lybalvi  to help manage her mood.  Today she is agreeable to restarting lybalvi  15-10 mg to help manage mood.  She will continue other medications as prescribed.  1. Severe recurrent major depressive disorder with psychotic features (HCC)  Continue- DULoxetine  (CYMBALTA ) 60 MG capsule; Take 1 capsule (60 mg total) by mouth daily.  Dispense: 30 capsule; Refill: 3 Continue-  DULoxetine  (CYMBALTA ) 20 MG capsule; Take 1 capsule (20 mg total) by mouth every morning.  Dispense: 30 capsule; Refill: 3 Continue- gabapentin  (NEURONTIN ) 300 MG capsule; Take 1 capsule (300 mg total) by mouth 3 (three) times daily.  Dispense: 90 capsule; Refill: 3 Restart- OLANZapine -Samidorphan (LYBALVI ) 15-10 MG TABS; Take 15 mg by mouth at bedtime.  Dispense: 30 tablet; Refill: 3 Continue- traZODone  (DESYREL ) 50 MG tablet; Take 1-2 tablets (50-100 mg total) by mouth at bedtime as needed for sleep.  Dispense: 60 tablet; Refill: 3  2. Generalized anxiety disorder  Continue- DULoxetine  (CYMBALTA ) 60 MG capsule; Take 1 capsule (60 mg total) by mouth daily.  Dispense: 30 capsule; Refill: 3 Continue- DULoxetine  (CYMBALTA ) 20 MG capsule; Take 1 capsule (20 mg total) by mouth every morning.  Dispense: 30 capsule; Refill: 3 Continue- gabapentin  (NEURONTIN ) 300 MG capsule; Take 1 capsule (300 mg total) by mouth 3 (three) times daily.  Dispense: 90 capsule; Refill: 3 Continue- traZODone  (DESYREL ) 50 MG tablet; Take 1-2 tablets (50-100 mg total) by mouth at bedtime as needed for sleep.  Dispense: 60 tablet; Refill: 3  3.  Marijuana use (Primary)       Follow-up in 2.5 months Follow-up with therapy   Zane FORBES Bach, NP 01/27/2024, 12:18 PM

## 2024-01-28 ENCOUNTER — Other Ambulatory Visit: Payer: Self-pay | Admitting: Internal Medicine

## 2024-01-28 ENCOUNTER — Other Ambulatory Visit: Payer: Self-pay

## 2024-01-28 DIAGNOSIS — E785 Hyperlipidemia, unspecified: Secondary | ICD-10-CM

## 2024-01-28 MED ORDER — ATORVASTATIN CALCIUM 40 MG PO TABS
40.0000 mg | ORAL_TABLET | Freq: Every day | ORAL | 2 refills | Status: DC
  Filled 2024-01-28: qty 90, 90d supply, fill #0

## 2024-01-28 NOTE — Telephone Encounter (Signed)
 Copied from CRM [id number]. Topic: Clinical - Medication Refill >> Jan 28, 2024 10:26 AM Myrick T wrote: Most Recent Primary Care Visit:  Provider: FLEETA TONIA GARNETTE LITTIE  Department: [facility]  Visit Type: OFFICE VISIT  Date: 01/15/2024  Medication: atorvastatin  (LIPITOR) 40 MG tablet   Has the patient contacted their pharmacy? No  Is this the correct pharmacy for this prescription? Yes  This is the patient's preferred pharmacy:   [id number] - [place] ([place]), [place] - [address] [place] ([place]) [place] [address] Phone: [phone] Fax: [phone]   Has the prescription been filled recently? Yes  Is the patient out of the medication? Yes  Has the patient been seen for an appointment in the last year OR does the patient have an upcoming appointment? Yes  Can we respond through MyChart? Yes  Agent: Please be advised that Rx refills may take up to 3 business days. We ask that you follow-up with your pharmacy.

## 2024-01-29 ENCOUNTER — Other Ambulatory Visit: Payer: Self-pay

## 2024-02-02 ENCOUNTER — Other Ambulatory Visit: Payer: Self-pay

## 2024-02-03 ENCOUNTER — Other Ambulatory Visit: Payer: Self-pay

## 2024-02-05 ENCOUNTER — Ambulatory Visit: Payer: Medicaid Other | Admitting: "Endocrinology

## 2024-02-13 ENCOUNTER — Ambulatory Visit: Payer: Medicaid Other | Admitting: Dietician

## 2024-02-13 NOTE — Progress Notes (Signed)
 S:     No chief complaint on file.  53 y.o. female who presents for diabetes evaluation, education, and management. Patient was referred and last seen by Primary Care Provider, Dr. Laural Benes, on 01/08/2024. Patient was last seen by Endocrine on 01/13/2024. Most recent A1c was 10.3 in December, up from 9.9 prior.   PMH is significant for T1DM (LADA), HTN, MDD, migraines, obesity, HLD, vit d def, non-obstructive CAD.   Last saw pharmacy on 1/23. At that visit, pt reported insulin management through Endocrine. Pt reported taking insulin R u500 TID. Of note, she had switched from [place] 3 to [facility] G7 and brought in her Clarity app for review. Although A1c had increased, CGM showed improvement since starting and titrating u500 R with Endo.  Today, patient arrives in good spirits and presents without any assistance. Endorses increased stress this past month which she reports to contributing to her increased blood glucose levels.  Family/Social History:  Sxh: Former Smoker. Occasional marijuana use, Occasional Drinking Fhx: Mother DM, HTN, Depression. Father: DM, HTN  Current diabetes medications include: insulin R u500; takes 70 units before breakfast, 50 (Reports increases to 55 units as needed) before lunch, and 50-55 units before dinner.  Current antihypertension medications include amlodipine 10 mg daily, metoprolol  Current anti hyperlipidemia medications include atorvastatin 40 mg daily  Patient reports adherence to taking all medications as prescribed.   Insurance coverage: [place] medicaid  Patient denies hypoglycemic events.  Patient denies nocturia (nighttime urination).  Patient denies neuropathy (nerve pain). Patient denies visual changes. Patient denies self foot exams.   Patient reported dietary habits:  -endorses less fried foods & salt intake, more baked foods and vegetables  Patient-reported exercise habits:  -2 days/week treadmill 20 min  O:  Clarity G7 CGM Download  today. 30-day report  Average Glucose: 220 Glucose Management Indicator: 8.6 (from 30 day data) - Time in range 70-180: 32% - Time above range: 66% - Time below range:1% Coefficient of Variance: 27.1% Daily patterns: most highs occur late evening to overnight.  Lab Results  Component Value Date   HGBA1C 10.3 (H) 12/23/2023   There were no vitals filed for this visit.  Lipid Panel     Component Value Date/Time   CHOL 224 (H) 11/17/2023 1047   CHOL 177 11/30/2021 1039   TRIG 163 (H) 11/17/2023 1047   HDL 48 (L) 11/17/2023 1047   HDL 40 11/30/2021 1039   CHOLHDL 4.7 11/17/2023 1047   VLDL 19 11/14/2014 0707   LDLCALC 146 (H) 11/17/2023 1047    Clinical Atherosclerotic Cardiovascular Disease (ASCVD): No  The 10-year ASCVD risk score (Arnett DK, et al., 2019) is: 17%   Values used to calculate the score:     Age: 32 years     Sex: Female     Is Non-Hispanic African American: Yes     Diabetic: Yes     Tobacco smoker: No     Systolic Blood Pressure: 139 mmHg     Is BP treated: Yes     HDL Cholesterol: 48 mg/dL     Total Cholesterol: 224 mg/dL   Patient is participating in a Managed Medicaid Plan:  Yes    A/P: Diabetes longstanding currently uncontrolled. Last A1c was 10.3 on 12/31, but CGM shows improvement in glucose management (past 30 day GMI 8.6). CGM shows elevated blood sugars patterns after dinner (her biggest meal) and extends through the night. We will increase her evening insulin dose 60 units before dinner. Patient  understands to decrease evening insulin to ~57/58 units if hypoglycemia occurs.  Medication adherence appears appropriate.  -Increased dinner time insulin to 60 units. -Continue monitoring home CBGs with Dexcom G7 -Patient educated on purpose, proper use, and potential adverse effects of insulin.  -Extensively discussed pathophysiology of diabetes, recommended lifestyle interventions, dietary effects on blood sugar control.  -Counseled on s/sx of and  management of hypoglycemia.  -Next A1c anticipated 02/2024.   Written patient instructions provided. Patient verbalized understanding of treatment plan.  Total time in face to face counseling 30 minutes.    Follow-up:  PCP: 05/13/2024 Pharmacist: 02/24/2024  Seen By: Haywood Filler, PharmD Candidate [facility] : [employer]  Class of 2027  Butch Penny, PharmD, [place], CPP Clinical Pharmacist [facility] [phone]

## 2024-02-16 ENCOUNTER — Other Ambulatory Visit: Payer: Self-pay

## 2024-02-16 ENCOUNTER — Encounter: Payer: Self-pay | Admitting: Pharmacist

## 2024-02-16 ENCOUNTER — Ambulatory Visit: Payer: Medicaid Other | Attending: Family Medicine | Admitting: Pharmacist

## 2024-02-16 DIAGNOSIS — Z794 Long term (current) use of insulin: Secondary | ICD-10-CM | POA: Diagnosis not present

## 2024-02-16 DIAGNOSIS — E1065 Type 1 diabetes mellitus with hyperglycemia: Secondary | ICD-10-CM

## 2024-02-16 MED ORDER — HUMULIN R U-500 KWIKPEN 500 UNIT/ML ~~LOC~~ SOPN
PEN_INJECTOR | SUBCUTANEOUS | 3 refills | Status: DC
  Filled 2024-02-16 – 2024-03-04 (×3): qty 12, 34d supply, fill #0
  Filled 2024-04-06: qty 12, 34d supply, fill #1
  Filled 2024-05-05 – 2024-05-11 (×2): qty 12, 34d supply, fill #2
  Filled 2024-06-11 (×2): qty 12, 34d supply, fill #3

## 2024-02-16 MED ORDER — HUMULIN R U-500 KWIKPEN 500 UNIT/ML ~~LOC~~ SOPN
PEN_INJECTOR | SUBCUTANEOUS | 3 refills | Status: DC
  Filled 2024-02-16: qty 12, 36d supply, fill #0

## 2024-02-18 ENCOUNTER — Ambulatory Visit (INDEPENDENT_AMBULATORY_CARE_PROVIDER_SITE_OTHER): Payer: Medicaid Other | Admitting: Orthopaedic Surgery

## 2024-02-18 ENCOUNTER — Encounter: Payer: Self-pay | Admitting: Orthopaedic Surgery

## 2024-02-18 DIAGNOSIS — G5603 Carpal tunnel syndrome, bilateral upper limbs: Secondary | ICD-10-CM

## 2024-02-18 NOTE — Progress Notes (Signed)
 Office Visit Note   Patient: Whitney Benton           Date of Birth: 1971-03-28           MRN: [id number] Visit Date: 02/18/2024              Requested by: Marcine Matar, MD [address] [place],  [place] [address] PCP: Marcine Matar, MD   Assessment & Plan: Visit Diagnoses:  1. Bilateral carpal tunnel syndrome     Plan: Patient is a 53 year old female with bilateral carpal tunnel syndrome.  The main reason we could not do surgery in the past was and elevated A1c.  Based on her blood sugar monitor her A1c has improved.  Will get a serum A1c today to confirm.  We will contact her with results and further steps.  Total face to face encounter time was greater than 25 minutes and over half of this time was spent in counseling and/or coordination of care.  Follow-Up Instructions: No follow-ups on file.   Orders:  No orders of the defined types were placed in this encounter.  No orders of the defined types were placed in this encounter.     Procedures: No procedures performed   Clinical Data: No additional findings.   Subjective: Chief Complaint  Patient presents with   Left Wrist - Numbness   Right Wrist - Numbness    HPI Whitney Benton returns today for follow-up evaluation of bilateral carpal tunnel syndrome. Review of Systems  Constitutional: Negative.   HENT: Negative.    Eyes: Negative.   Respiratory: Negative.    Cardiovascular: Negative.   Endocrine: Negative.   Musculoskeletal: Negative.   Neurological: Negative.   Hematological: Negative.   Psychiatric/Behavioral: Negative.    All other systems reviewed and are negative.    Objective: Vital Signs: There were no vitals taken for this visit.  Physical Exam Vitals and nursing note reviewed.  Constitutional:      Appearance: She is well-developed.  HENT:     Head: Normocephalic and atraumatic.  Pulmonary:     Effort: Pulmonary effort is normal.  Abdominal:     Palpations: Abdomen  is soft.  Musculoskeletal:     Cervical back: Neck supple.  Skin:    General: Skin is warm.     Capillary Refill: Capillary refill takes less than 2 seconds.  Neurological:     Mental Status: She is alert and oriented to person, place, and time.  Psychiatric:        Behavior: Behavior normal.        Thought Content: Thought content normal.        Judgment: Judgment normal.     Ortho Exam Examinations of bilateral hands are unchanged from prior visit. Specialty Comments:  No specialty comments available.  Imaging: No results found.   PMFS History: Patient Active Problem List   Diagnosis Date Noted   Intractable nausea and vomiting 12/23/2023   Diabetic gastroparesis (HCC) 12/22/2023   DKA, type 1 (HCC) 06/20/2023   Essential hypertension 06/20/2023   Vitamin D deficiency 06/20/2023   Hyperlipidemia 03/06/2023   Primary insomnia 12/27/2022   Gallstone pancreatitis 12/03/2022   Chronic cholecystitis with calculus 12/03/2022   Morbid obesity (HCC) 04/04/2022   Former smoker 04/30/2021   Major depressive disorder, recurrent episode, moderate (HCC) 04/30/2021   Substance induced mood disorder (HCC) 03/22/2021   Generalized anxiety disorder 03/22/2021   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   DKA (diabetic  ketoacidosis) (HCC) 01/23/2021   Glaucoma suspect 04/12/2020   DKA, type 2, not at goal [facility]) 10/02/2019   AKI (acute kidney injury) (HCC) 10/02/2019   Hyperkalemia 10/02/2019   Leukocytosis 10/02/2019   Abnormal LFTs 10/02/2019   Stressful life events affecting family and household 08/06/2019   New onset type 2 diabetes mellitus (HCC) 02/04/2019   Morbid obesity with BMI of 40.0-44.9, adult (HCC) 02/04/2019   Tobacco dependence 02/04/2019   Left arm weakness 12/06/2014   Paresthesias/numbness 12/06/2014   Tobacco abuse 11/14/2014   Depression    Mixed incontinence 06/27/2014   History of TVH in 2006 for fibroids and menorrhagia; benign pathology  09/08/2011   Past Medical History:  Diagnosis Date   Allergy    Anxiety    Arthritis    Asthma    Depression    Diabetes mellitus without complication (HCC)    GERD (gastroesophageal reflux disease)    Glaucoma suspect    Hyperlipidemia    Trichomonas infection     Family History  Problem Relation Age of Onset   Diabetes Mother    Mental illness Mother    Depression Mother    Hypertension Mother    Colon cancer Father 69   Hypertension Father    Diabetes Father    Colon cancer Paternal Grandmother    Esophageal cancer Neg Hx    Stomach cancer Neg Hx    Rectal cancer Neg Hx     Past Surgical History:  Procedure Laterality Date   CESAREAN SECTION     UPPER GASTROINTESTINAL ENDOSCOPY     VAGINAL HYSTERECTOMY  12/23/2004   Fibroids, menorrhagia, benign pathology   Social History   Occupational History   Not on file  Tobacco Use   Smoking status: Former    Current packs/day: 0.00    Average packs/day: 0.3 packs/day for 8.0 years (2.0 ttl pk-yrs)    Types: Cigarettes    Start date: 2013    Quit date: 2021    Years since quitting: 4.1   Smokeless tobacco: Never  Vaping Use   Vaping status: Never Used  Substance and Sexual Activity   Alcohol use: Not Currently    Comment: occasional   Drug use: Yes    Types: Marijuana    Comment: occ   Sexual activity: Yes    Birth control/protection: Surgical

## 2024-02-19 ENCOUNTER — Encounter: Payer: Self-pay | Admitting: Orthopaedic Surgery

## 2024-02-19 LAB — EXTRA SPECIMEN

## 2024-02-19 LAB — HEMOGLOBIN A1C
Hgb A1c MFr Bld: 9.8 %{Hb} — ABNORMAL HIGH (ref <5.7)
Mean Plasma Glucose: 235 mg/dL
eAG (mmol/L): 13 mmol/L

## 2024-02-27 ENCOUNTER — Ambulatory Visit: Payer: Medicaid Other | Admitting: Nurse Practitioner

## 2024-03-01 ENCOUNTER — Other Ambulatory Visit: Payer: Self-pay

## 2024-03-03 ENCOUNTER — Encounter: Payer: Self-pay | Admitting: "Endocrinology

## 2024-03-03 ENCOUNTER — Ambulatory Visit (INDEPENDENT_AMBULATORY_CARE_PROVIDER_SITE_OTHER): Admitting: "Endocrinology

## 2024-03-03 ENCOUNTER — Other Ambulatory Visit: Payer: Self-pay

## 2024-03-03 VITALS — BP 118/80 | HR 81 | Ht 64.0 in | Wt 245.0 lb

## 2024-03-03 DIAGNOSIS — Z794 Long term (current) use of insulin: Secondary | ICD-10-CM | POA: Diagnosis not present

## 2024-03-03 DIAGNOSIS — E782 Mixed hyperlipidemia: Secondary | ICD-10-CM | POA: Diagnosis not present

## 2024-03-03 DIAGNOSIS — E1065 Type 1 diabetes mellitus with hyperglycemia: Secondary | ICD-10-CM

## 2024-03-03 MED ORDER — DEXCOM G7 SENSOR MISC
1.0000 | 3 refills | Status: DC
  Filled 2024-03-03: qty 3, 30d supply, fill #0
  Filled 2024-04-06: qty 3, 30d supply, fill #1
  Filled 2024-05-05: qty 3, 30d supply, fill #2
  Filled 2024-06-03: qty 3, 30d supply, fill #3
  Filled 2024-07-06 (×2): qty 3, 30d supply, fill #4
  Filled 2024-08-04: qty 3, 30d supply, fill #5
  Filled 2024-09-01: qty 3, 30d supply, fill #6
  Filled 2024-09-27 – 2024-09-28 (×2): qty 3, 30d supply, fill #7
  Filled 2024-10-27: qty 3, 30d supply, fill #8
  Filled 2024-11-29: qty 3, 30d supply, fill #9
  Filled 2024-12-24: qty 3, 30d supply, fill #10

## 2024-03-03 NOTE — Progress Notes (Signed)
 Outpatient Endocrinology Note [place], MD  03/03/24   Whitney Benton 1971/06/22 [id number]  Referring Provider: Marcine Matar, MD Primary Care Provider: Marcine Matar, MD Reason for consultation: Subjective   Assessment & Plan  Diagnoses and all orders for this visit:  Uncontrolled type 1 diabetes mellitus with hyperglycemia (HCC) -     Continuous Glucose Sensor (DEXCOM G7 SENSOR) MISC; Change sensor every 10 days. -     Lipid panel -     Microalbumin / creatinine urine ratio  Long-term insulin use (HCC)  Mixed hypercholesterolemia and hypertriglyceridemia    Diabetes Type I complicated by hyperglycemia, c-peptide -ve, GAD Ab + Lab Results  Component Value Date   GFR 78.96 10/09/2022   Hba1c goal less than 7, current Hba1c is  Lab Results  Component Value Date   HGBA1C 9.8 (H) 02/18/2024   Will recommend the following: U500 75 units before break fast, 45 units before lunch and 65 units before supper, 30 min before meals Given DexCom G7 sample Patient experiencing  personal life issues-extensively counseled   Stopped Metformin XR 500 mg bid - max tolerated dose. Patient did not experience any benefit from, only GI S/E  Changed to DexCom G7 from [place] 3 per pt request as Josephine Igo keeps falling off, pt unable to afford skin tac Stopped Toujeo 78 units qam and Humalog 26 units tidac 15 min before meals Was taken off of Trulicity due to stomach issues  No known contraindications/side effects to any of above medications Glucagon discussed and prescribed with refills on 11/24/23  -Last LD and Tg are as follows: Lab Results  Component Value Date   LDLCALC 146 (H) 11/17/2023    Lab Results  Component Value Date   TRIG 163 (H) 11/17/2023   -On atorvastatin 40 mg every day, compliant now, repeat labs in 3 months -Follow low fat diet and exercise   -Blood pressure goal <140/90 - Microalbumin/creatinine goal is < 30 -Last MA/Cr is as follows: Lab  Results  Component Value Date   MICROALBUR 0.6 11/17/2023   -not on ACE/ARB  -diet changes including salt restriction -limit eating outside -counseled BP targets per standards of diabetes care -uncontrolled blood pressure can lead to retinopathy, nephropathy and cardiovascular and atherosclerotic heart disease  Reviewed and counseled on: -A1C target -Blood sugar targets -Complications of uncontrolled diabetes  -Checking blood sugar before meals and bedtime and bring log next visit -All medications with mechanism of action and side effects -Hypoglycemia management: rule of 15's, Glucagon Emergency Kit and medical alert ID -low-carb low-fat plate-method diet -At least 20 minutes of physical activity per day -Annual dilated retinal eye exam and foot exam -compliance and follow up needs -follow up as scheduled or earlier if problem gets worse  Call if blood sugar is less than 70 or consistently above 250    Take a 15 gm snack of carbohydrate at bedtime before you go to sleep if your blood sugar is less than 100.    If you are going to fast after midnight for a test or procedure, ask your physician for instructions on how to reduce/decrease your insulin dose.    Call if blood sugar is less than 70 or consistently above 250  -Treating a low sugar by rule of 15  (15 gms of sugar every 15 min until sugar is more than 70) If you feel your sugar is low, test your sugar to be sure If your sugar is low (less than 70),  then take 15 grams of a fast acting Carbohydrate (3-4 glucose tablets or glucose gel or 4 ounces of juice or regular soda) Recheck your sugar 15 min after treating low to make sure it is more than 70 If sugar is still less than 70, treat again with 15 grams of carbohydrate          Don't drive the hour of hypoglycemia  If unconscious/unable to eat or drink by mouth, use glucagon injection or nasal spray baqsimi and call 911. Can repeat again in 15 min if still  unconscious.  Return in about 2 weeks (around 03/17/2024) for visit and 8 am labs before next visit.   I have reviewed current medications, nurse's notes, allergies, vital signs, past medical and surgical history, family medical history, and social history for this encounter. Counseled patient on symptoms, examination findings, lab findings, imaging results, treatment decisions and monitoring and prognosis. The patient understood the recommendations and agrees with the treatment plan. All questions regarding treatment plan were fully answered.  Whitney [place], MD  03/03/24    History of Present Illness Whitney Benton is a 53 y.o. year old female who presents for follow up of Type I diabetes mellitus.  Whitney Benton was first diagnosed in 2020.   Diabetes education +  Home diabetes regimen: U500 50 units before break fast, 50 units before lunch and 55 units before supper, 30 min before meals  Previously on, Toujeo 72 units qam Humalog 24 units tidac 15 min before meals Metformin XR 500 mg bid - gets diarrhea   COMPLICATIONS -  MI/Stroke -  retinopathy -  neuropathy -  nephropathy  BLOOD SUGAR DATA  CGM interpretation: At today's visit, we reviewed her CGM downloads. The full report is scanned in the media. Reviewing the CGM trends, BG are elevated all day with some lows after lunch.   Physical Exam  BP 118/80   Pulse 81   Ht 5\' 4"  (1.626 m)   Wt 245 lb (111.1 kg)   SpO2 94%   BMI 42.05 kg/m    Constitutional: well developed, well nourished Head: normocephalic, atraumatic Eyes: sclera anicteric, no redness Neck: supple Lungs: normal respiratory effort Neurology: alert and oriented Skin: dry, no appreciable rashes Musculoskeletal: no appreciable defects Psychiatric: normal mood and affect Diabetic Foot Exam - Simple   No data filed      Current Medications Patient's Medications  New Prescriptions   No medications on file  Previous Medications   ACCU-CHEK  SOFTCLIX LANCETS LANCETS    Use to check blood sugar 3 times daily   ALBUTEROL (VENTOLIN HFA) 108 (90 BASE) MCG/ACT INHALER    Inhale 2 puffs into the lungs every 6 (six) hours as needed for wheezing or shortness of breath (Cough).   AMLODIPINE (NORVASC) 10 MG TABLET    Take 1 tablet (10 mg total) by mouth daily.   ATORVASTATIN (LIPITOR) 40 MG TABLET    Take 1 tablet (40 mg total) by mouth daily.   BLOOD GLUCOSE MONITORING SUPPL (ACCU-CHEK GUIDE) W/DEVICE KIT    Use to check blood sugar 3 times daily.   BUSPIRONE (BUSPAR) 15 MG TABLET    Take 15 mg by mouth 3 (three) times daily.   CYCLOSPORINE (RESTASIS) 0.05 % OPHTHALMIC EMULSION    Apply 1 drop into both eyes twice a day   DULOXETINE (CYMBALTA) 20 MG CAPSULE    Take 1 capsule (20 mg total) by mouth every morning.   DULOXETINE (CYMBALTA) 60 MG CAPSULE  Take 1 capsule (60 mg total) by mouth daily.   ERENUMAB-AOOE (AIMOVIG) 140 MG/ML SOAJ    Inject 140 mg into the skin every 28 (twenty-eight) days.   GABAPENTIN (NEURONTIN) 300 MG CAPSULE    Take 1 capsule (300 mg total) by mouth 3 (three) times daily.   GLUCAGON (BAQSIMI ONE PACK) 3 MG/DOSE POWD    Place 1 Device into the nose as needed (Low blood sugar with impaired consciousness).   GLUCOSE BLOOD (ACCU-CHEK GUIDE) TEST STRIP    Use to check blood sugar 3 times daily   HYDROXYZINE (ATARAX) 50 MG TABLET    Take 1 tablet (50 mg total) by mouth 3 (three) times daily.   INSULIN PEN NEEDLE (PEN NEEDLES 3/16") 31G X 5 MM MISC    Use as directed with insulin pen   INSULIN REGULAR HUMAN CONCENTRATED (HUMULIN R U-500 KWIKPEN) 500 UNIT/ML KWIKPEN    Inject 70 Units into the skin daily with breakfast AND 50 Units daily before lunch AND 60 Units daily before supper. Take 30 minutes before meals.   METOCLOPRAMIDE (REGLAN) 10 MG TABLET    Take 1 tablet (10 mg total) by mouth 3 (three) times daily before meals.   METOPROLOL TARTRATE (LOPRESSOR) 25 MG TABLET    Take 12.5 mg by mouth 2 (two) times daily.    MULTIPLE VITAMINS-MINERALS (EYE VITAMINS PO)    Take 1 tablet by mouth daily.   OLANZAPINE-SAMIDORPHAN (LYBALVI) 15-10 MG TABS    Take 15 mg by mouth at bedtime.   OMEPRAZOLE (PRILOSEC) 40 MG CAPSULE    TAKE 1 CAPSULE BY MOUTH ONCE DAILY 30 MINUTES BEFORE BREAKFAST   ONDANSETRON (ZOFRAN-ODT) 4 MG DISINTEGRATING TABLET    Take 1 tablet (4 mg total) by mouth every 8 (eight) hours as needed.   SUMATRIPTAN (IMITREX) 100 MG TABLET    TAKE 1 TABLET EARLIEST ONSET OF MIGRAINE. MAY REPEAT IN 2 HOURS IF HEADACHE PERSISTS OR RECURS. MAXIMUM 2 TABLETS IN 24 HOURS.   TRAZODONE (DESYREL) 50 MG TABLET    Take 1-2 tablets (50-100 mg total) by mouth at bedtime as needed for sleep.   VITAMIN D, CHOLECALCIFEROL, 10 MCG (400 UNIT) CAPS    Take 400 Int'l Units/day by mouth daily.  Modified Medications   Modified Medication Previous Medication   CONTINUOUS GLUCOSE SENSOR (DEXCOM G7 SENSOR) MISC Continuous Glucose Sensor (DEXCOM G7 SENSOR) MISC      Change sensor every 10 days.    Change sensor every 10 days.  Discontinued Medications   No medications on file    Allergies Allergies  Allergen Reactions   Aspirin Hives   Trulicity [Dulaglutide] Other (See Comments)    DC'd due to chronic gastric and abdominal pain with nausea   Oxycodone Nausea And Vomiting    Past Medical History Past Medical History:  Diagnosis Date   Allergy    Anxiety    Arthritis    Asthma    Depression    Diabetes mellitus without complication (HCC)    GERD (gastroesophageal reflux disease)    Glaucoma suspect    Hyperlipidemia    Trichomonas infection     Past Surgical History Past Surgical History:  Procedure Laterality Date   CESAREAN SECTION     UPPER GASTROINTESTINAL ENDOSCOPY     VAGINAL HYSTERECTOMY  12/23/2004   Fibroids, menorrhagia, benign pathology    Family History family history includes Colon cancer in her paternal grandmother; Colon cancer (age of onset: 18) in her father; Depression in her mother;  Diabetes in her father and mother; Hypertension in her father and mother; Mental illness in her mother.  Social History Social History   Socioeconomic History   Marital status: Significant Other    Spouse name: Not on file   Number of children: 3   Years of education: 14   Highest education level: Not on file  Occupational History   Not on file  Tobacco Use   Smoking status: Former    Current packs/day: 0.00    Average packs/day: 0.3 packs/day for 8.0 years (2.0 ttl pk-yrs)    Types: Cigarettes    Start date: 2013    Quit date: 2021    Years since quitting: 4.1   Smokeless tobacco: Never  Vaping Use   Vaping status: Never Used  Substance and Sexual Activity   Alcohol use: Not Currently    Comment: occasional   Drug use: Yes    Types: Marijuana    Comment: occ   Sexual activity: Yes    Birth control/protection: Surgical  Other Topics Concern   Not on file  Social History Narrative   Patient lives at home with mother and father , one story    Patient has 3 children    Patient is single   Patient has 14 years of education    Patient is right handed    Caffeine none   Social Drivers of Corporate investment banker Strain: Not on file  Food Insecurity: No Food Insecurity (12/23/2023)   Hunger Vital Sign    Worried About Running Out of Food in the Last Year: Never true    Ran Out of Food in the Last Year: Never true  Transportation Needs: No Transportation Needs (12/23/2023)   PRAPARE - Administrator, Civil Service (Medical): No    Lack of Transportation (Non-Medical): No  Physical Activity: Not on file  Stress: Not on file  Social Connections: Unknown (12/23/2023)   Social Connection and Isolation Panel [NHANES]    Frequency of Communication with Friends and Family: More than three times a week    Frequency of Social Gatherings with Friends and Family: More than three times a week    Attends Religious Services: More than 4 times per year    Active  Member of Clubs or Organizations: Yes    Attends Banker Meetings: More than 4 times per year    Marital Status: Patient declined  Intimate Partner Violence: Not At Risk (12/23/2023)   Humiliation, Afraid, Rape, and Kick questionnaire    Fear of Current or Ex-Partner: No    Emotionally Abused: No    Physically Abused: No    Sexually Abused: No    Lab Results  Component Value Date   HGBA1C 9.8 (H) 02/18/2024   HGBA1C 10.3 (H) 12/23/2023   HGBA1C 9.9 (H) 11/17/2023   Lab Results  Component Value Date   CHOL 224 (H) 11/17/2023   Lab Results  Component Value Date   HDL 48 (L) 11/17/2023   Lab Results  Component Value Date   LDLCALC 146 (H) 11/17/2023   Lab Results  Component Value Date   TRIG 163 (H) 11/17/2023   Lab Results  Component Value Date   CHOLHDL 4.7 11/17/2023   Lab Results  Component Value Date   CREATININE 1.06 (H) 12/24/2023   Lab Results  Component Value Date   GFR 78.96 10/09/2022   Lab Results  Component Value Date   MICROALBUR 0.6 11/17/2023  Component Value Date/Time   NA 141 12/24/2023 0405   NA 140 12/12/2022 1036   K 3.8 12/24/2023 0405   CL 108 12/24/2023 0405   CO2 27 12/24/2023 0405   GLUCOSE 74 12/24/2023 0405   BUN 19 12/24/2023 0405   BUN 10 12/12/2022 1036   CREATININE 1.06 (H) 12/24/2023 0405   CREATININE 0.90 11/17/2023 1047   CALCIUM 9.2 12/24/2023 0405   PROT 7.3 12/24/2023 0405   PROT 7.0 11/30/2021 1039   ALBUMIN 3.7 12/24/2023 0405   ALBUMIN 4.4 11/30/2021 1039   AST 21 12/24/2023 0405   ALT 22 12/24/2023 0405   ALKPHOS 95 12/24/2023 0405   BILITOT 0.4 12/24/2023 0405   BILITOT 0.3 11/30/2021 1039   GFRNONAA >60 12/24/2023 0405   GFRAA >60 08/10/2020 2100      Latest Ref Rng & Units 12/24/2023    4:05 AM 12/23/2023    5:48 AM 12/23/2023   12:57 AM  BMP  Glucose 70 - 99 mg/dL 74  409    BUN 6 - 20 mg/dL 19  12    Creatinine 8.11 - 1.00 mg/dL 9.14  7.82  9.56   Sodium 135 - 145 mmol/L 141   131    Potassium 3.5 - 5.1 mmol/L 3.8  3.9    Chloride 98 - 111 mmol/L 108  101    CO2 22 - 32 mmol/L 27  19    Calcium 8.9 - 10.3 mg/dL 9.2  8.4         Component Value Date/Time   WBC 7.3 12/24/2023 0405   RBC 4.27 12/24/2023 0405   HGB 12.1 12/24/2023 0405   HGB 13.3 12/12/2022 1036   HCT 36.8 12/24/2023 0405   HCT 40.4 12/12/2022 1036   PLT 408 (H) 12/24/2023 0405   PLT 436 12/12/2022 1036   MCV 86.2 12/24/2023 0405   MCV 86 12/12/2022 1036   MCH 28.3 12/24/2023 0405   MCHC 32.9 12/24/2023 0405   RDW 14.3 12/24/2023 0405   RDW 12.9 12/12/2022 1036   LYMPHSABS 1.1 06/20/2023 1122   LYMPHSABS 2.4 07/14/2019 1058   MONOABS 0.6 06/20/2023 1122   EOSABS 0.0 06/20/2023 1122   EOSABS 0.1 07/14/2019 1058   BASOSABS 0.0 06/20/2023 1122   BASOSABS 0.0 07/14/2019 1058     Parts of this note may have been dictated using voice recognition software. There may be variances in spelling and vocabulary which are unintentional. Not all errors are proofread. Please notify the Thereasa Parkin if any discrepancies are noted or if the meaning of any statement is not clear.

## 2024-03-03 NOTE — Patient Instructions (Signed)
 Will recommend the following: U500 75 units before break fast, 45 units before lunch and 65 units before supper, 30 min before meals

## 2024-03-04 ENCOUNTER — Other Ambulatory Visit: Payer: Self-pay

## 2024-03-05 ENCOUNTER — Other Ambulatory Visit: Payer: Self-pay

## 2024-03-05 ENCOUNTER — Encounter: Payer: Self-pay | Admitting: Podiatry

## 2024-03-05 ENCOUNTER — Telehealth: Payer: Self-pay

## 2024-03-05 ENCOUNTER — Ambulatory Visit (INDEPENDENT_AMBULATORY_CARE_PROVIDER_SITE_OTHER): Payer: Self-pay | Admitting: Podiatry

## 2024-03-05 ENCOUNTER — Other Ambulatory Visit (HOSPITAL_COMMUNITY): Payer: Self-pay

## 2024-03-05 VITALS — Ht 64.0 in | Wt 245.0 lb

## 2024-03-05 DIAGNOSIS — E1065 Type 1 diabetes mellitus with hyperglycemia: Secondary | ICD-10-CM | POA: Diagnosis not present

## 2024-03-05 DIAGNOSIS — B351 Tinea unguium: Secondary | ICD-10-CM

## 2024-03-05 DIAGNOSIS — M79674 Pain in right toe(s): Secondary | ICD-10-CM | POA: Diagnosis not present

## 2024-03-05 DIAGNOSIS — M79675 Pain in left toe(s): Secondary | ICD-10-CM

## 2024-03-05 NOTE — Progress Notes (Signed)
  Subjective:  Patient ID: Whitney Benton, female    DOB: 05-21-1971,  MRN: [id number]  Chief Complaint  Patient presents with   Nail Problem    " My right big toe has been hurting as I stubbed it a couple of days ago and I have not been able to wear shoes as I am having some swelling at times"    53 y.o. female presents with the above complaint. History confirmed with patient. Patient presenting with pain related to dystrophic thickened elongated nails. Patient is unable to trim own nails related to nail dystrophy. Patient does have a history of T2DM.  Last A1c 9.8. Right big toenail is thickened, dislcolored and somewhat loose.  Objective:  Physical Exam: warm, good capillary refill.  Pedal hair growth absent nail exam onychomycosis of the toenails, onycholysis, dystrophic nails, and some nail plate elevation of the right first toenail.  Discoloration of the nail plates noted DP pulses palpable, PT pulses palpable, protective sensation intact, and vibratory sensation diminished +1 pitting edema bilaterally. Left Foot:  Pain with palpation of nails due to elongation and dystrophic growth.  Right Foot: Pain with palpation of nails due to elongation and dystrophic growth.   Assessment:   1. Uncontrolled type 1 diabetes mellitus with hyperglycemia (HCC)   2. Pain due to onychomycosis of toenails of both feet      Plan:  Patient was evaluated and treated and all questions answered.  #Onychomycosis with pain  -Nails palliatively debrided as below. -Educated on self-care -Trimmed back loose 1st toenail right foot substantially, recommend against toenail avulsion due to the patient's poorly controlled A1c  Procedure: Nail Debridement Rationale: Pain Type of Debridement: manual, sharp debridement. Instrumentation: Nail nipper, rotary burr. Number of Nails: # 10  Discussed compression stockings with patient.  Return for Diabetic Foot Care.         Bronwen Betters, DPM [facility]

## 2024-03-05 NOTE — Telephone Encounter (Signed)
 Pharmacy Patient Advocate Encounter   Received notification from CoverMyMeds that prior authorization for Dexcom G7 sensor is required/requested.   Insurance verification completed.   The patient is insured through [facility] .   Per test claim: PA required; PA submitted to above mentioned insurance via CoverMyMeds Key/confirmation #/EOC [id number] Status is pending

## 2024-03-08 ENCOUNTER — Other Ambulatory Visit: Payer: Self-pay

## 2024-03-08 ENCOUNTER — Ambulatory Visit (INDEPENDENT_AMBULATORY_CARE_PROVIDER_SITE_OTHER): Payer: Medicaid Other | Admitting: Clinical

## 2024-03-08 DIAGNOSIS — F331 Major depressive disorder, recurrent, moderate: Secondary | ICD-10-CM | POA: Diagnosis not present

## 2024-03-08 NOTE — Progress Notes (Signed)
   THERAPIST PROGRESS NOTE  Session Time: 30 minutes  Participation Level: Active  Behavioral Response: CasualAlertAnxious  Type of Therapy: Individual Therapy  Treatment Goals addressed: Whitney Benton WILL PARTICIPATE IN AT LEAST 80% OF SCHEDULED INDIVIDUAL PSYCHOTHERAPY SESSIONS   ProgressTowards Goals: Progressing  Interventions: CBT  Summary:  Whitney Benton is a 53 y.o. female who presents for the scheduled appointment oriented x 5, appropriately dressed, and friendly.  Client denied hallucinations or delusions. Client reported on today she has been stressed.  Client reported her home which was her parents is now for closure.  Client reported losing the house makes her very sad.  Client reported that she has asked her sister to help contribute to upkeep in the house but each time she has not helped to do so.  Client reported she has been staying at her son's apartment but she feels like he is getting tired of that.  Client reported there is an option where she can sell her home and get some money before the bank takes possession of it.  Client reported she has been thinking about where she is dynamics but also put her personal belongings. Client reported she still is in process for social security. Evidence of progress towards goal:  client reported 1 positive of communicating and managing negative emotions effectively.   Suicidal/Homicidal: Nowithout intent/plan  Therapist Response:  Therapist began the appointment asking the client how she has been doing since last seen. Therapist used CBT to engage with active listening and positive emotional support. Therapist used CBT to give the client time to discuss her thoughts and feelings about stressors pertaining to family, interpersonal relationships, and other psychosocial stressors. Therapist used CBT to normalize the clients emotional response within reason as well as continuing to teach her about boundaries and keeping priority of her  needs. Therapist used CBT ask the client to identify her progress with frequency of use with coping skills with continued practice in her daily activity.    Therapist assigned client homework to practice self-care.   Plan: Return again in 4 weeks.  Diagnosis: Major depressive disorder, recurrent, moderate  Collaboration of Care: Patient refused AEB none requested by the client.  Patient/Guardian was advised Release of Information must be obtained prior to any record release in order to collaborate their care with an outside provider. Patient/Guardian was advised if they have not already done so to contact the registration department to sign all necessary forms in order for [place] to release information regarding their care.   Consent: Patient/Guardian gives verbal consent for treatment and assignment of benefits for services provided during this visit. Patient/Guardian expressed understanding and agreed to proceed.   Neena Rhymes Cheyenne Schumm, LCSW 03/08/2024

## 2024-03-10 ENCOUNTER — Other Ambulatory Visit: Payer: Self-pay

## 2024-03-10 NOTE — Progress Notes (Unsigned)
 NEUROLOGY FOLLOW UP OFFICE NOTE  LASHELL MOFFITT [id number]  Assessment/Plan:   Migraine without aura, Without Status Migrainosus, Not Intractable Bilateral carpal tunnel syndrome     Migraine prevention:  Aimovig 140mg  Migraine rescue:  Sumatriptan 100mg   Limit use of pain relievers to no more than 2 days out of week to prevent risk of rebound or medication-overuse headache. Keep headache diary Try to optimize glycemic control (Hgb A1c goal less than 8) so can undergo carpal tunnel surgery.   Follow up 6 months.     Subjective:  Darleene R. Shepherd is a 53 year old right-handed female with diabetes and gastroparesis who follows up for migraines and bilateral occipital neuralgia.     UPDATE: Started Aimovig. Improved. Intensity:  Moderate to severe Duration:  30 minutes with sumatriptan but will return after a couple of hours Frequency:  Last migraine was in January. It seems to be triggered by the occipital neuralgia.  Referred to PT for neck pain.  However migraines stopped.  She hasn't been using the wrist splints because she is back and forth to 2 houses and tends to leave it at the other house.      Rescue therapy:  sumatriptan 100mg    Current NSAIDS:  none - GI bleed Current analgesics:  none Current triptans:  sumatriptan 100mg  Current ergotamine:  none Current anti-emetic:  Zofran ODT 4mg  Current muscle relaxants:  Flexeril 10mg  PRN Current anti-anxiolytic:  hydroxyzine Current sleep aide:  melatonin Current Antihypertensive medications:  metoprolol tartrate Current Antidepressant medications:  Cymbalta 60mg  daily Current Anticonvulsant medications:  gabapentin 300mg  three times daily Current anti-CGRP:  none Current Vitamins/Herbal/Supplements:  Melatonin, B12 Current Antihistamines/Decongestants:  none Other therapy:  none Hormone/birth control:  none   Caffeine:  Cut down on coffee.  Now only decaff.  No soda Diet:  Drinks water with electrolytes.  Does  not skip meals Exercise:  Walks 30 minutes daily Depression:  yes; Anxiety:  yes Other pain:  no Sleep hygiene:  poor   HISTORY:  Migraines since 2000 but returned around 2019.  Initial workup in early 2000s included lumbar puncture but unsure of the results.  She doesn't remember if she had an MRI of the brain.  They are severe sharp and pounding pain in back of head bilaterally and radiates to the front.  She has associated flashes in her vision, photophobia, phonophobia, osmophobia, feels off-balance and sometimes nausea but no  Autonomic symptoms, numbness or weakness.  They usually last 2-3 weeks.  They occur about twice a year, usually in Spring and Summer but can occur other times.  Triggers unknown.  Nothing really relieves them.   CT brain on 06/14/2020 personally reviewed was normal. Eye exam in 2021 okay.  In November 2015, she began experiencing left arm weakness and pain.  She reports numbness in the fingertips and palm of both hands.  MRI of cervical spine 11/14/2014 showed multilevel disc degeneration and spondylosis with bilateral mild foraminal stenosis and mild spinal stenosis at C5-6.  She had a NCV-EMG which revealed bilateral carpal tunnel syndrome.  She was referred to a hand specialist at that time but never followed up.  She continued to have some numbness in left hand and later involving the right hand.  NCV-EMG on 07/23/2022 revealed evidence of bilateral moderate to severe carpal tunnel syndrome.  Referred to hand specialist.  Surgery was recommended but not until Hgb A1c is under 8.0.   Past NSAIDS:  Meloxicam, ketorolac, ibuprofen, naproxen Past analgesics:  Tylenol (elevated liver function), Excedrin Past abortive triptans:  rizatriptan 10mg .  Sumatriptan NS too expensive. Past abortive ergotamine:  none Past muscle relaxants:  Robaxin, Flexeril Past anti-emetic:  promethazine Past antihypertensive medications:  none Past antidepressant medications:  Nortriptyline,  sertraline 50mg  Past anticonvulsant medications:  topiramate 100mg  Past anti-CGRP:  none Past vitamins/Herbal/Supplements:  none Past antihistamines/decongestants:  Benadryl, Zyrtec Other past therapies:  none     Family history of headache:  no  PAST MEDICAL HISTORY: Past Medical History:  Diagnosis Date   Allergy    Anxiety    Arthritis    Asthma    Depression    Diabetes mellitus without complication (HCC)    GERD (gastroesophageal reflux disease)    Glaucoma suspect    Hyperlipidemia    Trichomonas infection     MEDICATIONS: Current Outpatient Medications on File Prior to Visit  Medication Sig Dispense Refill   Accu-Chek Softclix Lancets lancets Use to check blood sugar 3 times daily 100 each 6   albuterol (VENTOLIN HFA) 108 (90 Base) MCG/ACT inhaler Inhale 2 puffs into the lungs every 6 (six) hours as needed for wheezing or shortness of breath (Cough). 18 g 0   amLODipine (NORVASC) 10 MG tablet Take 1 tablet (10 mg total) by mouth daily. 90 tablet 2   atorvastatin (LIPITOR) 40 MG tablet Take 1 tablet (40 mg total) by mouth daily. 90 tablet 2   Blood Glucose Monitoring Suppl (ACCU-CHEK GUIDE) w/Device KIT Use to check blood sugar 3 times daily. 1 kit 0   busPIRone (BUSPAR) 15 MG tablet Take 15 mg by mouth 3 (three) times daily.     Continuous Glucose Sensor (DEXCOM G7 SENSOR) MISC Change sensor every 10 days. 9 each 3   cycloSPORINE (RESTASIS) 0.05 % ophthalmic emulsion Apply 1 drop into both eyes twice a day 180 each 3   DULoxetine (CYMBALTA) 20 MG capsule Take 1 capsule (20 mg total) by mouth every morning. 30 capsule 3   DULoxetine (CYMBALTA) 60 MG capsule Take 1 capsule (60 mg total) by mouth daily. 30 capsule 3   Erenumab-aooe (AIMOVIG) 140 MG/ML SOAJ Inject 140 mg into the skin every 28 (twenty-eight) days. 1.12 mL 5   gabapentin (NEURONTIN) 300 MG capsule Take 1 capsule (300 mg total) by mouth 3 (three) times daily. 90 capsule 3   Glucagon (BAQSIMI ONE PACK) 3  MG/DOSE POWD Place 1 Device into the nose as needed (Low blood sugar with impaired consciousness). 2 each 3   glucose blood (ACCU-CHEK GUIDE) test strip Use to check blood sugar 3 times daily 100 each 6   hydrOXYzine (ATARAX) 50 MG tablet Take 1 tablet (50 mg total) by mouth 3 (three) times daily. 90 tablet 3   Insulin Pen Needle (PEN NEEDLES 3/16") 31G X 5 MM MISC Use as directed with insulin pen 100 each 2   insulin regular human CONCENTRATED (HUMULIN R U-500 KWIKPEN) 500 UNIT/ML KwikPen Inject 70 Units into the skin daily with breakfast AND 50 Units daily before lunch AND 60 Units daily before supper. Take 30 minutes before meals. 12 mL 3   metoCLOPramide (REGLAN) 10 MG tablet Take 1 tablet (10 mg total) by mouth 3 (three) times daily before meals. (Patient taking differently: Take 10 mg by mouth in the morning.) 90 tablet 0   metoprolol tartrate (LOPRESSOR) 25 MG tablet Take 12.5 mg by mouth 2 (two) times daily.     Multiple Vitamins-Minerals (EYE VITAMINS PO) Take 1 tablet by mouth daily.  OLANZapine-Samidorphan (LYBALVI) 15-10 MG TABS Take 15 mg by mouth at bedtime. 30 tablet 3   omeprazole (PRILOSEC) 40 MG capsule TAKE 1 CAPSULE BY MOUTH ONCE DAILY 30 MINUTES BEFORE BREAKFAST 90 capsule 2   ondansetron (ZOFRAN-ODT) 4 MG disintegrating tablet Take 1 tablet (4 mg total) by mouth every 8 (eight) hours as needed. 20 tablet 0   SUMAtriptan (IMITREX) 100 MG tablet TAKE 1 TABLET EARLIEST ONSET OF MIGRAINE. MAY REPEAT IN 2 HOURS IF HEADACHE PERSISTS OR RECURS. MAXIMUM 2 TABLETS IN 24 HOURS. 10 tablet 5   traZODone (DESYREL) 50 MG tablet Take 1-2 tablets (50-100 mg total) by mouth at bedtime as needed for sleep. 60 tablet 3   Vitamin D, Cholecalciferol, 10 MCG (400 UNIT) CAPS Take 400 Int'l Units/day by mouth daily. 100 capsule 1   [DISCONTINUED] cetirizine (ZYRTEC) 10 MG tablet Take 1 tablet (10 mg total) by mouth daily. (Patient not taking: No sig reported) 30 tablet 1   No current  facility-administered medications on file prior to visit.    ALLERGIES: Allergies  Allergen Reactions   Aspirin Hives   Trulicity [Dulaglutide] Other (See Comments)    DC'd due to chronic gastric and abdominal pain with nausea   Oxycodone Nausea And Vomiting    FAMILY HISTORY: Family History  Problem Relation Age of Onset   Diabetes Mother    Mental illness Mother    Depression Mother    Hypertension Mother    Colon cancer Father 43   Hypertension Father    Diabetes Father    Colon cancer Paternal Grandmother    Esophageal cancer Neg Hx    Stomach cancer Neg Hx    Rectal cancer Neg Hx       Objective:  Blood pressure 117/74, pulse 96, height 5\' 1"  (1.549 m), weight 240 lb (108.9 kg), SpO2 96%. General: No acute distress.  Patient appears well-groomed.   Head:  Normocephalic/atraumatic Neck:  Supple.  No paraspinal tenderness.  Full range of motion. Heart:  Regular rate and rhythm. Neuro:  Alert and oriented.  Speech fluent and not dysarthric.  Language intact.  CN II-XII intact.  Bulk and tone normal.  Muscle strength 5/5 throughout, toes downgoing.  Deep tendon reflexes 2+ throughout.  Gait normal.  Romberg negative.    Shon Millet, DO  CC: Jonah Blue, MD

## 2024-03-11 ENCOUNTER — Ambulatory Visit: Payer: Medicaid Other | Admitting: Neurology

## 2024-03-11 ENCOUNTER — Encounter: Payer: Self-pay | Admitting: Neurology

## 2024-03-11 ENCOUNTER — Telehealth: Payer: Self-pay

## 2024-03-11 VITALS — BP 117/74 | HR 96 | Ht 61.0 in | Wt 240.0 lb

## 2024-03-11 DIAGNOSIS — G43009 Migraine without aura, not intractable, without status migrainosus: Secondary | ICD-10-CM | POA: Diagnosis not present

## 2024-03-11 DIAGNOSIS — G5603 Carpal tunnel syndrome, bilateral upper limbs: Secondary | ICD-10-CM | POA: Diagnosis not present

## 2024-03-11 MED ORDER — AIMOVIG 140 MG/ML ~~LOC~~ SOAJ: 140.0000 mg | SUBCUTANEOUS | 5 refills | Status: DC

## 2024-03-11 MED ORDER — SUMATRIPTAN SUCCINATE 100 MG PO TABS: ORAL_TABLET | ORAL | 5 refills | Status: AC

## 2024-03-11 NOTE — Telephone Encounter (Signed)
 PA needed for Aimovig 140 mg

## 2024-03-14 NOTE — Progress Notes (Deleted)
 S:     No chief complaint on file.  53 y.o. female who presents for diabetes evaluation, education, and management. Patient was referred and last seen by Primary Care Provider, Dr. Laural Benes, on 01/08/2024. Patient was seen by Endocrine on 01/13/2024. Most recent A1c was 10.3 in December, up from 9.9 prior.   PMH is significant for T1DM (LADA), HTN, MDD, migraines, obesity, HLD, vit d def, non-obstructive CAD.   Saw pharmacy on 01/15/24. At that visit, pt reported insulin management through Endocrine. Pt reported taking insulin R u500 TID. Of note, she had switched from [place] 3 to [facility] G7 and brought in her Clarity app for review. Although A1c had increased, CGM showed improvement since starting and titrating u500 R with Endo. At pharmacy appointment on 02/16/24, CGM demonstrated trend of elevated BG after dinner, so increased insulin U500 to 60 units with dinner. At endocrine appt on 03/03/24 - A1C improved to 9.8%. CGM showed persistently elevated BG with some lows after lunch - insulin U500 regimen was adjusted to: 75 units before breakfast, 45 units before lunch, and 65 units before dinner. C  Today, patient arrives in good spirits and presents without any assistance. Endorses increased stress this past month which she reports to contributing to her increased blood glucose levels.***  Family/Social History:  Sxh: Former Smoker. Occasional marijuana use, Occasional Drinking Fhx: Mother DM, HTN, Depression. Father: DM, HTN  Current diabetes medications include: insulin R u500; takes 75 units before breakfast, 45 before lunch, and 65 units before dinner.  Current antihypertension medications include amlodipine 10 mg daily, metoprolol tartrate 12.5 mg BID*** Current anti hyperlipidemia medications include atorvastatin 40 mg daily  Patient reports adherence to taking all medications as prescribed.   Insurance coverage: [place] medicaid  Patient denies hypoglycemic events.  Patient denies  nocturia (nighttime urination). *** Patient denies neuropathy (nerve pain). Patient denies visual changes. Patient denies self foot exams.   Patient reported dietary habits: *** -endorses less fried foods & salt intake, more baked foods and vegetables  Patient-reported exercise habits: *** -2 days/week treadmill 20 min  O:  Clarity G7 CGM Download today. 30-day report *** Average Glucose: 220 Glucose Management Indicator: 8.6 (from 30 day data) - Time in range 70-180: 32% - Time above range: 66% - Time below range:1% Coefficient of Variance: 27.1% Daily patterns: most highs occur late evening to overnight.  Lab Results  Component Value Date   HGBA1C 9.8 (H) 02/18/2024   There were no vitals filed for this visit.  Lipid Panel     Component Value Date/Time   CHOL 224 (H) 11/17/2023 1047   CHOL 177 11/30/2021 1039   TRIG 163 (H) 11/17/2023 1047   HDL 48 (L) 11/17/2023 1047   HDL 40 11/30/2021 1039   CHOLHDL 4.7 11/17/2023 1047   VLDL 19 11/14/2014 0707   LDLCALC 146 (H) 11/17/2023 1047    Clinical Atherosclerotic Cardiovascular Disease (ASCVD): No  The 10-year ASCVD risk score (Arnett DK, et al., 2019) is: 9.3%   Values used to calculate the score:     Age: 56 years     Sex: Female     Is Non-Hispanic African American: Yes     Diabetic: Yes     Tobacco smoker: No     Systolic Blood Pressure: 117 mmHg     Is BP treated: Yes     HDL Cholesterol: 48 mg/dL     Total Cholesterol: 224 mg/dL   Patient is participating in a Managed Medicaid  Plan:  Yes   Look at dexcom, adjust insulin BP well controlled UACR good- Nov 2024, 3   A/P: Diabetes longstanding currently uncontrolled. Last A1c was 9.8% on 02/18/24, improved from 10.3%. CGM shows improvement in glucose management (past 30 day GMI 8.6)***. CGM shows elevated blood sugars patterns after dinner (her biggest meal) and extends through the night. We will increase her evening insulin dose 60 units before dinner.  Patient understands to decrease evening insulin to ~57/58 units if hypoglycemia occurs.  Medication adherence appears appropriate.  -Increased dinner time insulin to 60 units. -Continue monitoring home CBGs with Dexcom G7 -Patient educated on purpose, proper use, and potential adverse effects of insulin.  -Extensively discussed pathophysiology of diabetes, recommended lifestyle interventions, dietary effects on blood sugar control.  -Counseled on s/sx of and management of hypoglycemia.  -Next A1c anticipated 02/2024.   Written patient instructions provided. Patient verbalized understanding of treatment plan.  Total time in face to face counseling 30 minutes.    Follow-up:  Endo: 04/06/24 Pharmacist: *** PCP: 05/13/2024  Nils Pyle, PharmD PGY1 Pharmacy Resident  Butch Penny, PharmD, BCACP, CPP Clinical Pharmacist [facility] [phone]

## 2024-03-15 ENCOUNTER — Telehealth: Payer: Self-pay | Admitting: Pharmacist

## 2024-03-15 ENCOUNTER — Ambulatory Visit: Payer: Medicaid Other | Admitting: Pharmacist

## 2024-03-15 NOTE — Telephone Encounter (Signed)
 Called patient to reschedule an appointment she had with me today. Unfortunately, I was unable to reach the patient so I left a HIPAA-compliant message requesting that the patient return my call. She is a MM patient that we are following, in addition to Endo, for DM management.   Butch Penny, PharmD, Patsy [place], CPP Clinical Pharmacist [facility] [phone]

## 2024-03-17 ENCOUNTER — Other Ambulatory Visit (HOSPITAL_COMMUNITY): Payer: Self-pay

## 2024-03-17 ENCOUNTER — Telehealth: Payer: Self-pay | Admitting: Pharmacy Technician

## 2024-03-17 NOTE — Telephone Encounter (Signed)
 Pharmacy Patient Advocate Encounter   Received notification from Patient Advice Request messages that prior authorization for AIMOVIG 140MG  is required/requested.   Insurance verification completed.   The patient is insured through [facility] .   Per test claim: PA required; PA submitted to above mentioned insurance via CoverMyMeds Key/confirmation #/[facility] Status is pending

## 2024-03-17 NOTE — Telephone Encounter (Signed)
 PA has been submitted, and telephone encounter has been created. Please see telephone encounter dated 3.26.25.

## 2024-03-18 ENCOUNTER — Other Ambulatory Visit: Payer: Self-pay

## 2024-03-22 NOTE — Progress Notes (Unsigned)
 Cardiology Office Note    Patient Name: Whitney Benton Date of Encounter: 03/22/2024  Primary Care Provider:  Marcine Matar, MD Primary Cardiologist:  Whitney Sprague, MD (Inactive) Primary Electrophysiologist: None   Past Medical History    Past Medical History:  Diagnosis Date   Allergy    Anxiety    Arthritis    Asthma    Depression    Diabetes mellitus without complication (HCC)    GERD (gastroesophageal reflux disease)    Glaucoma suspect    Hyperlipidemia    Trichomonas infection     History of Present Illness  Whitney Benton is a 53 y.o. female with a PMH of insulin-dependent DM type II, HLD, obesity, tobacco abuse,obesity who presents today for follow-up.  [name] was seen initially by Whitney Benton in 06/2021 for evaluation of tachycardia.  She presented to the ED with palpitations and associated shortness of breath with dyspnea.  She was started on a beta-blocker was unable to wear a cardiac monitor due to lack of insurance.  She was referred for the orange card and further patient assistance.  She was seen in the ED on 12/2021 with complaint of chest pain and completed a coronary CTA that showed calcium score of 1 and mild plaque diffusely.  She was seen in follow-up on 10/16/2022 with complaint of palpitations an event monitor was worn that showed sinus rhythm with 1 run of NSVT and 1 run of SVT and patient was continued on metoprolol.  She was last seen by Whitney Benton on 02/13/2023 and reported doing overall okay.  She reported some precordial chest pain that was found to be GI in nature based on previous ischemic workup.  She was started on amlodipine for possible vasospasm TTE was ordered that showed EF of 55% with no RWMA and mild concentric LVH and grade 1 DD with no significant valve abnormalities.  Patient denies chest pain, palpitations, dyspnea, PND, orthopnea, nausea, vomiting, dizziness, syncope, edema, weight gain, or early satiety.   Discussed the  use of AI scribe software for clinical note transcription with the patient, who gave verbal consent to proceed.  History of Present Illness    ***Notes: -Last ischemic evaluation:  Review of Systems  Please see the history of present illness.    All other systems reviewed and are otherwise negative except as noted above.  Physical Exam    Wt Readings from Last 3 Encounters:  03/11/24 240 lb (108.9 kg)  03/05/24 245 lb (111.1 kg)  03/03/24 245 lb (111.1 kg)   ZO:XWRUE were no vitals filed for this visit.,There is no height or weight on file to calculate BMI. GEN: Well nourished, well developed in no acute distress Neck: No JVD; No carotid bruits Pulmonary: Clear to auscultation without rales, wheezing or rhonchi  Cardiovascular: Normal rate. Regular rhythm. Normal S1. Normal S2.   Murmurs: There is no murmur.  ABDOMEN: Soft, non-tender, non-distended EXTREMITIES:  No edema; No deformity   EKG/LABS/ Recent Cardiac Studies   ECG personally reviewed by me today - ***  Risk Assessment/Calculations:   {Does this patient have ATRIAL FIBRILLATION?:[phone]}      Lab Results  Component Value Date   WBC 7.3 12/24/2023   HGB 12.1 12/24/2023   HCT 36.8 12/24/2023   MCV 86.2 12/24/2023   PLT 408 (H) 12/24/2023   Lab Results  Component Value Date   CREATININE 1.06 (H) 12/24/2023   BUN 19 12/24/2023   NA 141 12/24/2023   K  3.8 12/24/2023   CL 108 12/24/2023   CO2 27 12/24/2023   Lab Results  Component Value Date   CHOL 224 (H) 11/17/2023   HDL 48 (L) 11/17/2023   LDLCALC 146 (H) 11/17/2023   TRIG 163 (H) 11/17/2023   CHOLHDL 4.7 11/17/2023    Lab Results  Component Value Date   HGBA1C 9.8 (H) 02/18/2024   Assessment & Plan    1.  Mild CAD  2.  Hyperlipidemia  3.  IDDM type II:  4.  Palpitations:      Disposition: Follow-up with Whitney Sprague, MD (Inactive) or APP in *** months {Are you ordering a CV Procedure (e.g. stress test, cath, DCCV, TEE,  etc)?   Press F2        :[id number]}   Signed, Whitney Benton, Whitney Rains, NP 03/22/2024, 7:43 AM  [facility]

## 2024-03-23 ENCOUNTER — Encounter: Payer: Self-pay | Admitting: Nurse Practitioner

## 2024-03-23 ENCOUNTER — Ambulatory Visit: Attending: Nurse Practitioner | Admitting: Nurse Practitioner

## 2024-03-23 ENCOUNTER — Other Ambulatory Visit: Payer: Self-pay

## 2024-03-23 VITALS — BP 130/82 | HR 109 | Ht 61.0 in | Wt 243.6 lb

## 2024-03-23 DIAGNOSIS — R002 Palpitations: Secondary | ICD-10-CM

## 2024-03-23 DIAGNOSIS — Z8709 Personal history of other diseases of the respiratory system: Secondary | ICD-10-CM

## 2024-03-23 DIAGNOSIS — E782 Mixed hyperlipidemia: Secondary | ICD-10-CM | POA: Diagnosis not present

## 2024-03-23 DIAGNOSIS — J45909 Unspecified asthma, uncomplicated: Secondary | ICD-10-CM | POA: Diagnosis not present

## 2024-03-23 DIAGNOSIS — R000 Tachycardia, unspecified: Secondary | ICD-10-CM | POA: Diagnosis not present

## 2024-03-23 DIAGNOSIS — I2510 Atherosclerotic heart disease of native coronary artery without angina pectoris: Secondary | ICD-10-CM | POA: Diagnosis not present

## 2024-03-23 MED ORDER — FUROSEMIDE 20 MG PO TABS: 20.0000 mg | ORAL_TABLET | Freq: Every day | ORAL | 0 refills | Status: DC | PRN

## 2024-03-23 MED ORDER — METOPROLOL TARTRATE 25 MG PO TABS: 25.0000 mg | ORAL_TABLET | Freq: Two times a day (BID) | ORAL | 5 refills | Status: AC

## 2024-03-23 MED ORDER — BLOOD PRESSURE CUFF MISC: 1.0000 | Freq: Every day | 0 refills | Status: AC

## 2024-03-23 MED ORDER — BLOOD PRESSURE CUFF MISC: 1.0000 | Freq: Every day | 0 refills | Status: DC

## 2024-03-23 NOTE — Patient Instructions (Addendum)
 Medication Instructions:  START Lasix 20mg  take 1 tablet once a day as needed for weight gain of 2lbs in a day or 5lbs in a week  INCREASE Metoprolol to 25mg  Take 1 tablet twice a day  *If you need a refill on your cardiac medications before your next appointment, please call your pharmacy*  Lab Work: TODAY-CMET, LIPIDS, TSH, & FREE T4 If you have labs (blood work) drawn today and your tests are completely normal, you will receive your results only by: MyChart Message (if you have MyChart) OR A paper copy in the mail If you have any lab test that is abnormal or we need to change your treatment, we will call you to review the results.  Testing/Procedures: NONE ORDERED  Follow-Up: At [facility], you and your health needs are our priority.  As part of our continuing mission to provide you with exceptional heart care, our providers are all part of one team.  This team includes your primary Cardiologist (physician) and Advanced Practice Providers or APPs (Physician Assistants and Nurse Practitioners) who all work together to provide you with the care you need, when you need it.  Your next appointment:   6 month(s)  Provider:   Thomasene Ripple, MD  We recommend signing up for the patient portal called "MyChart".  Sign up information is provided on this After Visit Summary.  MyChart is used to connect with patients for Virtual Visits (Telemedicine).  Patients are able to view lab/test results, encounter notes, upcoming appointments, etc.  Non-urgent messages can be sent to your provider as well.   To learn more about what you can do with MyChart, go to [email or web address].   Other Instructions You have been referred to PULMONOLOGY. Please get some ted hose or compression stockings. They can be purchased at your local medical supply store, [employer], [employer] or Charity fundraiser.  Put them on first thing in the morning and wear them during the day . Elevate your feet during the day and  remove hose in the evening before bed.        1st Floor: - Lobby - Registration  - Pharmacy  - Lab - Cafe  2nd Floor: - PV Lab - Diagnostic Testing (echo, CT, nuclear med)  3rd Floor: - Vacant  4th Floor: - TCTS (cardiothoracic surgery) - AFib Clinic - Structural Heart Clinic - Vascular Surgery  - Vascular Ultrasound  5th Floor: - HeartCare Cardiology (general and EP) - Clinical Pharmacy for coumadin, hypertension, lipid, weight-loss medications, and med management appointments    Valet parking services will be available as well.

## 2024-03-24 ENCOUNTER — Telehealth: Payer: Self-pay | Admitting: Nurse Practitioner

## 2024-03-24 ENCOUNTER — Other Ambulatory Visit (HOSPITAL_COMMUNITY): Payer: Self-pay | Admitting: Psychiatry

## 2024-03-24 LAB — COMPREHENSIVE METABOLIC PANEL WITH GFR
ALT: 21 IU/L (ref 0–32)
AST: 26 IU/L (ref 0–40)
Albumin: 4.3 g/dL (ref 3.8–4.9)
Alkaline Phosphatase: 170 IU/L — ABNORMAL HIGH (ref 44–121)
BUN/Creatinine Ratio: 11 (ref 9–23)
BUN: 10 mg/dL (ref 6–24)
Bilirubin Total: 0.3 mg/dL (ref 0.0–1.2)
CO2: 25 mmol/L (ref 20–29)
Calcium: 9.6 mg/dL (ref 8.7–10.2)
Chloride: 99 mmol/L (ref 96–106)
Creatinine, Ser: 0.88 mg/dL (ref 0.57–1.00)
Globulin, Total: 2.6 g/dL (ref 1.5–4.5)
Glucose: 328 mg/dL — ABNORMAL HIGH (ref 70–99)
Potassium: 4.9 mmol/L (ref 3.5–5.2)
Sodium: 138 mmol/L (ref 134–144)
Total Protein: 6.9 g/dL (ref 6.0–8.5)
eGFR: 79 mL/min/{1.73_m2} (ref >59)

## 2024-03-24 LAB — LIPID PANEL
Chol/HDL Ratio: 4.1 ratio (ref 0.0–4.4)
Cholesterol, Total: 179 mg/dL (ref 100–199)
HDL: 44 mg/dL (ref >39)
LDL Chol Calc (NIH): 115 mg/dL — ABNORMAL HIGH (ref 0–99)
Triglycerides: 110 mg/dL (ref 0–149)
VLDL Cholesterol Cal: 20 mg/dL (ref 5–40)

## 2024-03-24 LAB — TSH+FREE T4
Free T4: 0.78 ng/dL — ABNORMAL LOW (ref 0.82–1.77)
TSH: 2.22 u[IU]/mL (ref 0.450–4.500)

## 2024-03-24 NOTE — Telephone Encounter (Signed)
 I spoke with patient and reviewed 4/1 lab results with her

## 2024-03-24 NOTE — Telephone Encounter (Signed)
Pt returning call regarding test results. Please advise ?

## 2024-03-26 ENCOUNTER — Encounter (HOSPITAL_COMMUNITY): Payer: Self-pay

## 2024-03-26 ENCOUNTER — Telehealth (HOSPITAL_COMMUNITY): Payer: Medicaid Other | Admitting: Psychiatry

## 2024-03-26 DIAGNOSIS — I10 Essential (primary) hypertension: Secondary | ICD-10-CM | POA: Diagnosis not present

## 2024-03-30 ENCOUNTER — Encounter (HOSPITAL_COMMUNITY): Payer: Self-pay | Admitting: Psychiatry

## 2024-03-30 ENCOUNTER — Telehealth (HOSPITAL_COMMUNITY): Admitting: Psychiatry

## 2024-03-30 DIAGNOSIS — F411 Generalized anxiety disorder: Secondary | ICD-10-CM

## 2024-03-30 DIAGNOSIS — F333 Major depressive disorder, recurrent, severe with psychotic symptoms: Secondary | ICD-10-CM

## 2024-03-30 MED ORDER — DULOXETINE HCL 60 MG PO CPEP: 60.0000 mg | ORAL_CAPSULE | Freq: Every day | ORAL | 3 refills | Status: DC

## 2024-03-30 MED ORDER — LYBALVI 15-10 MG PO TABS: 15.0000 mg | ORAL_TABLET | Freq: Every evening | ORAL | 3 refills | Status: DC

## 2024-03-30 MED ORDER — HYDROXYZINE HCL 50 MG PO TABS: 50.0000 mg | ORAL_TABLET | Freq: Three times a day (TID) | ORAL | 3 refills | Status: DC

## 2024-03-30 MED ORDER — GABAPENTIN 300 MG PO CAPS: 300.0000 mg | ORAL_CAPSULE | Freq: Three times a day (TID) | ORAL | 3 refills | Status: DC

## 2024-03-30 MED ORDER — TRAZODONE HCL 50 MG PO TABS: 50.0000 mg | ORAL_TABLET | Freq: Every evening | ORAL | 3 refills | Status: DC | PRN

## 2024-03-30 MED ORDER — DULOXETINE HCL 20 MG PO CPEP: 20.0000 mg | ORAL_CAPSULE | Freq: Every morning | ORAL | 3 refills | Status: DC

## 2024-03-30 MED ORDER — BUSPIRONE HCL 15 MG PO TABS: 15.0000 mg | ORAL_TABLET | Freq: Three times a day (TID) | ORAL | 3 refills | Status: DC

## 2024-03-30 MED ORDER — QUETIAPINE FUMARATE 25 MG PO TABS: 25.0000 mg | ORAL_TABLET | Freq: Every evening | ORAL | 3 refills | Status: DC | PRN
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
## 2024-03-31 ENCOUNTER — Other Ambulatory Visit

## 2024-03-31 DIAGNOSIS — E1065 Type 1 diabetes mellitus with hyperglycemia: Secondary | ICD-10-CM | POA: Diagnosis not present

## 2024-04-01 ENCOUNTER — Other Ambulatory Visit

## 2024-04-01 ENCOUNTER — Telehealth (HOSPITAL_COMMUNITY): Payer: Self-pay | Admitting: *Deleted

## 2024-04-01 LAB — LIPID PANEL
Cholesterol: 169 mg/dL (ref <200)
HDL: 40 mg/dL — ABNORMAL LOW (ref >=50)
LDL Cholesterol (Calc): 105 mg/dL — ABNORMAL HIGH
Non-HDL Cholesterol (Calc): 129 mg/dL (ref <130)
Total CHOL/HDL Ratio: 4.2 (calc) (ref <5.0)
Triglycerides: 142 mg/dL (ref <150)

## 2024-04-01 NOTE — Telephone Encounter (Signed)
 PA submitted on covermymeds for Lybalvi. Waiting on the determination,

## 2024-04-06 ENCOUNTER — Encounter: Payer: Self-pay | Admitting: "Endocrinology

## 2024-04-06 ENCOUNTER — Other Ambulatory Visit: Payer: Self-pay

## 2024-04-06 ENCOUNTER — Ambulatory Visit (INDEPENDENT_AMBULATORY_CARE_PROVIDER_SITE_OTHER): Admitting: "Endocrinology

## 2024-04-06 VITALS — BP 118/80 | HR 74 | Ht 61.0 in | Wt 237.0 lb

## 2024-04-06 DIAGNOSIS — E1065 Type 1 diabetes mellitus with hyperglycemia: Secondary | ICD-10-CM | POA: Diagnosis not present

## 2024-04-06 DIAGNOSIS — E782 Mixed hyperlipidemia: Secondary | ICD-10-CM

## 2024-04-06 MED ORDER — ROSUVASTATIN CALCIUM 40 MG PO TABS
40.0000 mg | ORAL_TABLET | Freq: Every day | ORAL | 3 refills | Status: DC
  Filled 2024-04-06: qty 90, 90d supply, fill #0

## 2024-04-06 NOTE — Patient Instructions (Addendum)
 Will recommend the following: U500 80 units before break fast, 60 units before lunch and 75 units before supper, 30 min before meals Decrease by 5 units if BG is <70   _____________   Goals of DM therapy:  Morning Fasting blood sugar: 80-140  Blood sugar before meals: 80-140 Bed time blood sugar: 100-150  A1C <7%, limited only by hypoglycemia  1.Diabetes medications and their side effects discussed, including hypoglycemia    2. Check blood glucose:  a) Always check blood sugars before driving. Please see below (under hypoglycemia) on how to manage b) Check a minimum of 3 times/day or more as needed when having symptoms of hypoglycemia.   c) Try to check blood glucose before sleeping/in the middle of the night to ensure that it is remaining stable and not dropping less than 100 d) Check blood glucose more often if sick  3. Diet: a) 3 meals per day schedule b: Restrict carbs to 60-70 grams (4 servings) per meal c) Colorful vegetables - 3 servings a day, and low sugar fruit 2 servings/day Plate control method: 1/4 plate protein, 1/4 starch, 1/2 green, yellow, or red vegetables d) Avoid carbohydrate snacks unless hypoglycemic episode, or increased physical activity  4. Regular exercise as tolerated, preferably 3 or more hours a week  5. Hypoglycemia: a)  Do not drive or operate machinery without first testing blood glucose to assure it is over 90 mg%, or if dizzy, lightheaded, not feeling normal, etc, or  if foot or leg is numb or weak. b)  If blood glucose less than 70, take four 5gm Glucose tabs or 15-30 gm Glucose gel.  Repeat every 15 min as needed until blood sugar is >100 mg/dl. If hypoglycemia persists then call 911.   6. Sick day management: a) Check blood glucose more often b) Continue usual therapy if blood sugars are elevated.   7. Contact the doctor immediately if blood glucose is frequently <60 mg/dl, or an episode of severe hypoglycemia occurs (where someone had to  give you glucose/  glucagon or if you passed out from a low blood glucose), or if blood glucose is persistently >350 mg/dl, for further management  8. A change in level of physical activity or exercise and a change in diet may also affect your blood sugar. Check blood sugars more often and call if needed.  Instructions: 1. Bring glucose meter, blood glucose records on every visit for review 2. Continue to follow up with primary care physician and other providers for medical care 3. Yearly eye  and foot exam 4. Please get blood work done prior to the next appointment

## 2024-04-06 NOTE — Progress Notes (Signed)
 Outpatient Endocrinology Note Altamese [place], MD  04/06/24   Whitney Benton 1971-01-14 [id number]  Referring Provider: Marcine Matar, MD Primary Care Provider: Marcine Matar, MD Reason for consultation: Subjective   Assessment & Plan  Diagnoses and all orders for this visit:  Uncontrolled type 1 diabetes mellitus with hyperglycemia (HCC)  Long-term insulin use (HCC)  Mixed hypercholesterolemia and hypertriglyceridemia  Other orders -     rosuvastatin (CRESTOR) 40 MG tablet; Take 1 tablet (40 mg total) by mouth daily.   Diabetes Type I complicated by hyperglycemia, c-peptide -ve, GAD Ab + Lab Results  Component Value Date   GFR 78.96 10/09/2022   Hba1c goal less than 7, current Hba1c is  Lab Results  Component Value Date   HGBA1C 9.8 (H) 02/18/2024   Will recommend the following: U500 80 units before break fast, 60 units before lunch and 75 units before supper, 30 min before meals Decrease by 5 units if BG is <70  Has  DexCom G7  Patient experiencing  personal life issues-extensively counseled   Stopped Metformin XR 500 mg bid - max tolerated dose. Patient did not experience any benefit from, only GI S/E  Changed to DexCom G7 from Nibbe 3 per pt request as Josephine Igo keeps falling off, pt unable to afford skin tac Stopped Toujeo 78 units qam and Humalog 26 units tidac 15 min before meals Was taken off of Trulicity due to stomach issues  No known contraindications/side effects to any of above medications Glucagon discussed and prescribed with refills on 11/24/23  -Last LD and Tg are as follows: Lab Results  Component Value Date   LDLCALC 105 (H) 03/31/2024    Lab Results  Component Value Date   TRIG 142 03/31/2024   -On atorvastatin 40 mg every day -04/06/24: start rosuvastatin 40 mg every day -Follow low fat diet and exercise   -Blood pressure goal <140/90 - Microalbumin/creatinine goal is < 30 -Last MA/Cr is as follows: Lab Results  Component  Value Date   MICROALBUR 0.6 11/17/2023   -not on ACE/ARB  -diet changes including salt restriction -limit eating outside -counseled BP targets per standards of diabetes care -uncontrolled blood pressure can lead to retinopathy, nephropathy and cardiovascular and atherosclerotic heart disease  Reviewed and counseled on: -A1C target -Blood sugar targets -Complications of uncontrolled diabetes  -Checking blood sugar before meals and bedtime and bring log next visit -All medications with mechanism of action and side effects -Hypoglycemia management: rule of 15's, Glucagon Emergency Kit and medical alert ID -low-carb low-fat plate-method diet -At least 20 minutes of physical activity per day -Annual dilated retinal eye exam and foot exam -compliance and follow up needs -follow up as scheduled or earlier if problem gets worse  Call if blood sugar is less than 70 or consistently above 250    Take a 15 gm snack of carbohydrate at bedtime before you go to sleep if your blood sugar is less than 100.    If you are going to fast after midnight for a test or procedure, ask your physician for instructions on how to reduce/decrease your insulin dose.    Call if blood sugar is less than 70 or consistently above 250  -Treating a low sugar by rule of 15  (15 gms of sugar every 15 min until sugar is more than 70) If you feel your sugar is low, test your sugar to be sure If your sugar is low (less than 70), then take 15 grams  of a fast acting Carbohydrate (3-4 glucose tablets or glucose gel or 4 ounces of juice or regular soda) Recheck your sugar 15 min after treating low to make sure it is more than 70 If sugar is still less than 70, treat again with 15 grams of carbohydrate          Don't drive the hour of hypoglycemia  If unconscious/unable to eat or drink by mouth, use glucagon injection or nasal spray baqsimi and call 911. Can repeat again in 15 min if still unconscious.  Return in about 17  days (around 04/23/2024).   I have reviewed current medications, nurse's notes, allergies, vital signs, past medical and surgical history, family medical history, and social history for this encounter. Counseled patient on symptoms, examination findings, lab findings, imaging results, treatment decisions and monitoring and prognosis. The patient understood the recommendations and agrees with the treatment plan. All questions regarding treatment plan were fully answered.  Alt[place], MD  04/06/24    History of Present Illness Whitney Benton is a 53 y.o. year old female who presents for follow up of Type I diabetes mellitus.  Whitney Benton was first diagnosed in 2020.   Diabetes education +  Home diabetes regimen: U500 70 units before break fast, 50 units before lunch and 70 units before supper, 30 min before meals  Previously on, Toujeo 72 units qam Humalog 24 units tidac 15 min before meals Metformin XR 500 mg bid - gets diarrhea   COMPLICATIONS -  MI/Stroke -  retinopathy -  neuropathy -  nephropathy  BLOOD SUGAR DATA  CGM interpretation: At today's visit, we reviewed her CGM downloads. The full report is scanned in the media. Reviewing the CGM trends, BG are elevated all day with some lows after dinner/overnight.   Physical Exam  BP 118/80   Pulse 74   Ht 5\' 1"  (1.549 m)   Wt 237 lb (107.5 kg)   SpO2 97%   BMI 44.78 kg/m    Constitutional: well developed, well nourished Head: normocephalic, atraumatic Eyes: sclera anicteric, no redness Neck: supple Lungs: normal respiratory effort Neurology: alert and oriented Skin: dry, no appreciable rashes Musculoskeletal: no appreciable defects Psychiatric: normal mood and affect Diabetic Foot Exam - Simple   No data filed      Current Medications Patient's Medications  New Prescriptions   ROSUVASTATIN (CRESTOR) 40 MG TABLET    Take 1 tablet (40 mg total) by mouth daily.  Previous Medications   ACCU-CHEK SOFTCLIX  LANCETS LANCETS    Use to check blood sugar 3 times daily   ALBUTEROL (VENTOLIN HFA) 108 (90 BASE) MCG/ACT INHALER    Inhale 2 puffs into the lungs every 6 (six) hours as needed for wheezing or shortness of breath (Cough).   AMLODIPINE (NORVASC) 10 MG TABLET    Take 1 tablet (10 mg total) by mouth daily.   BLOOD GLUCOSE MONITORING SUPPL (ACCU-CHEK GUIDE) W/DEVICE KIT    Use to check blood sugar 3 times daily.   BLOOD PRESSURE MONITORING (BLOOD PRESSURE CUFF) MISC    1 each by Does not apply route daily. Please provide Large cuff   BUSPIRONE (BUSPAR) 15 MG TABLET    Take 1 tablet (15 mg total) by mouth 3 (three) times daily.   CONTINUOUS GLUCOSE SENSOR (DEXCOM G7 SENSOR) MISC    Change sensor every 10 days.   CYCLOSPORINE (RESTASIS) 0.05 % OPHTHALMIC EMULSION    Apply 1 drop into both eyes twice a day  DULOXETINE (CYMBALTA) 20 MG CAPSULE    Take 1 capsule (20 mg total) by mouth every morning.   DULOXETINE (CYMBALTA) 60 MG CAPSULE    Take 1 capsule (60 mg total) by mouth daily.   ERENUMAB-AOOE (AIMOVIG) 140 MG/ML SOAJ    Inject 140 mg into the skin every 28 (twenty-eight) days.   FUROSEMIDE (LASIX) 20 MG TABLET    Take 1 tablet (20 mg total) by mouth daily as needed (weight gain).   GABAPENTIN (NEURONTIN) 300 MG CAPSULE    Take 1 capsule (300 mg total) by mouth 3 (three) times daily.   GLUCAGON (BAQSIMI ONE PACK) 3 MG/DOSE POWD    Place 1 Device into the nose as needed (Low blood sugar with impaired consciousness).   GLUCOSE BLOOD (ACCU-CHEK GUIDE) TEST STRIP    Use to check blood sugar 3 times daily   HYDROXYZINE (ATARAX) 50 MG TABLET    Take 1 tablet (50 mg total) by mouth 3 (three) times daily.   INSULIN PEN NEEDLE (PEN NEEDLES 3/16") 31G X 5 MM MISC    Use as directed with insulin pen   INSULIN REGULAR HUMAN CONCENTRATED (HUMULIN R U-500 KWIKPEN) 500 UNIT/ML KWIKPEN    Inject 70 Units into the skin daily with breakfast AND 50 Units daily before lunch AND 60 Units daily before supper. Take 30  minutes before meals.   METOCLOPRAMIDE (REGLAN) 10 MG TABLET    Take 1 tablet (10 mg total) by mouth 3 (three) times daily before meals.   METOPROLOL TARTRATE (LOPRESSOR) 25 MG TABLET    Take 1 tablet (25 mg total) by mouth 2 (two) times daily.   MULTIPLE VITAMINS-MINERALS (EYE VITAMINS PO)    Take 1 tablet by mouth daily.   OLANZAPINE-SAMIDORPHAN (LYBALVI) 15-10 MG TABS    Take 15 mg by mouth at bedtime.   OMEPRAZOLE (PRILOSEC) 40 MG CAPSULE    TAKE 1 CAPSULE BY MOUTH ONCE DAILY 30 MINUTES BEFORE BREAKFAST   ONDANSETRON (ZOFRAN-ODT) 4 MG DISINTEGRATING TABLET    Take 1 tablet (4 mg total) by mouth every 8 (eight) hours as needed.   QUETIAPINE (SEROQUEL) 25 MG TABLET    Take 1 tablet (25 mg total) by mouth at bedtime as needed.   SUMATRIPTAN (IMITREX) 100 MG TABLET    TAKE 1 TABLET EARLIEST ONSET OF MIGRAINE. MAY REPEAT IN 2 HOURS IF HEADACHE PERSISTS OR RECURS. MAXIMUM 2 TABLETS IN 24 HOURS.   TRAZODONE (DESYREL) 50 MG TABLET    Take 1-2 tablets (50-100 mg total) by mouth at bedtime as needed for sleep.   VITAMIN B-12 (CYANOCOBALAMIN) 50 MCG TABLET    Take 50 mcg by mouth daily.   VITAMIN D, CHOLECALCIFEROL, 10 MCG (400 UNIT) CAPS    Take 400 Int'l Units/day by mouth daily.  Modified Medications   No medications on file  Discontinued Medications   ATORVASTATIN (LIPITOR) 40 MG TABLET    Take 1 tablet (40 mg total) by mouth daily.    Allergies Allergies  Allergen Reactions   Aspirin Hives   Trulicity [Dulaglutide] Other (See Comments)    DC'd due to chronic gastric and abdominal pain with nausea   Oxycodone Nausea And Vomiting    Past Medical History Past Medical History:  Diagnosis Date   Allergy    Anxiety    Arthritis    Asthma    Depression    Diabetes mellitus without complication (HCC)    GERD (gastroesophageal reflux disease)    Glaucoma suspect    Hyperlipidemia  Trichomonas infection     Past Surgical History Past Surgical History:  Procedure Laterality Date    CESAREAN SECTION     UPPER GASTROINTESTINAL ENDOSCOPY     VAGINAL HYSTERECTOMY  12/23/2004   Fibroids, menorrhagia, benign pathology    Family History family history includes Colon cancer in her paternal grandmother; Colon cancer (age of onset: 28) in her father; Depression in her mother; Diabetes in her father and mother; Hypertension in her father and mother; Mental illness in her mother.  Social History Social History   Socioeconomic History   Marital status: Significant Other    Spouse name: Not on file   Number of children: 3   Years of education: 14   Highest education level: Not on file  Occupational History   Not on file  Tobacco Use   Smoking status: Former    Current packs/day: 0.00    Average packs/day: 0.3 packs/day for 8.0 years (2.0 ttl pk-yrs)    Types: Cigarettes    Start date: 2013    Quit date: 2021    Years since quitting: 4.2   Smokeless tobacco: Never  Vaping Use   Vaping status: Never Used  Substance and Sexual Activity   Alcohol use: Not Currently    Comment: occasional   Drug use: Yes    Types: Marijuana    Comment: occ   Sexual activity: Yes    Birth control/protection: Surgical  Other Topics Concern   Not on file  Social History Narrative   Patient lives at home with mother and father , one story    Patient has 3 children    Patient is single   Patient has 14 years of education    Patient is right handed    Caffeine none   Social Drivers of Corporate investment banker Strain: Not on file  Food Insecurity: No Food Insecurity (12/23/2023)   Hunger Vital Sign    Worried About Running Out of Food in the Last Year: Never true    Ran Out of Food in the Last Year: Never true  Transportation Needs: No Transportation Needs (12/23/2023)   PRAPARE - Administrator, Civil Service (Medical): No    Lack of Transportation (Non-Medical): No  Physical Activity: Not on file  Stress: Not on file  Social Connections: Unknown (12/23/2023)    Social Connection and Isolation Panel [NHANES]    Frequency of Communication with Friends and Family: More than three times a week    Frequency of Social Gatherings with Friends and Family: More than three times a week    Attends Religious Services: More than 4 times per year    Active Member of Clubs or Organizations: Yes    Attends Banker Meetings: More than 4 times per year    Marital Status: Patient declined  Intimate Partner Violence: Not At Risk (12/23/2023)   Humiliation, Afraid, Rape, and Kick questionnaire    Fear of Current or Ex-Partner: No    Emotionally Abused: No    Physically Abused: No    Sexually Abused: No    Lab Results  Component Value Date   HGBA1C 9.8 (H) 02/18/2024   HGBA1C 10.3 (H) 12/23/2023   HGBA1C 9.9 (H) 11/17/2023   Lab Results  Component Value Date   CHOL 169 03/31/2024   Lab Results  Component Value Date   HDL 40 (L) 03/31/2024   Lab Results  Component Value Date   LDLCALC 105 (H) 03/31/2024   Lab  Results  Component Value Date   TRIG 142 03/31/2024   Lab Results  Component Value Date   CHOLHDL 4.2 03/31/2024   Lab Results  Component Value Date   CREATININE 0.88 03/23/2024   Lab Results  Component Value Date   GFR 78.96 10/09/2022   Lab Results  Component Value Date   MICROALBUR 0.6 11/17/2023      Component Value Date/Time   NA 138 03/23/2024 0934   K 4.9 03/23/2024 0934   CL 99 03/23/2024 0934   CO2 25 03/23/2024 0934   GLUCOSE 328 (H) 03/23/2024 0934   GLUCOSE 74 12/24/2023 0405   BUN 10 03/23/2024 0934   CREATININE 0.88 03/23/2024 0934   CREATININE 0.90 11/17/2023 1047   CALCIUM 9.6 03/23/2024 0934   PROT 6.9 03/23/2024 0934   ALBUMIN 4.3 03/23/2024 0934   AST 26 03/23/2024 0934   ALT 21 03/23/2024 0934   ALKPHOS 170 (H) 03/23/2024 0934   BILITOT 0.3 03/23/2024 0934   GFRNONAA >60 12/24/2023 0405   GFRAA >60 08/10/2020 2100      Latest Ref Rng & Units 03/23/2024    9:34 AM 12/24/2023    4:05  AM 12/23/2023    5:48 AM  BMP  Glucose 70 - 99 mg/dL 829  74  562   BUN 6 - 24 mg/dL 10  19  12    Creatinine 0.57 - 1.00 mg/dL 1.30  8.65  7.84   BUN/Creat Ratio 9 - 23 11     Sodium 134 - 144 mmol/L 138  141  131   Potassium 3.5 - 5.2 mmol/L 4.9  3.8  3.9   Chloride 96 - 106 mmol/L 99  108  101   CO2 20 - 29 mmol/L 25  27  19    Calcium 8.7 - 10.2 mg/dL 9.6  9.2  8.4        Component Value Date/Time   WBC 7.3 12/24/2023 0405   RBC 4.27 12/24/2023 0405   HGB 12.1 12/24/2023 0405   HGB 13.3 12/12/2022 1036   HCT 36.8 12/24/2023 0405   HCT 40.4 12/12/2022 1036   PLT 408 (H) 12/24/2023 0405   PLT 436 12/12/2022 1036   MCV 86.2 12/24/2023 0405   MCV 86 12/12/2022 1036   MCH 28.3 12/24/2023 0405   MCHC 32.9 12/24/2023 0405   RDW 14.3 12/24/2023 0405   RDW 12.9 12/12/2022 1036   LYMPHSABS 1.1 06/20/2023 1122   LYMPHSABS 2.4 07/14/2019 1058   MONOABS 0.6 06/20/2023 1122   EOSABS 0.0 06/20/2023 1122   EOSABS 0.1 07/14/2019 1058   BASOSABS 0.0 06/20/2023 1122   BASOSABS 0.0 07/14/2019 1058     Parts of this note may have been dictated using voice recognition software. There may be variances in spelling and vocabulary which are unintentional. Not all errors are proofread. Please notify the [facility] if any discrepancies are noted or if the meaning of any statement is not clear.

## 2024-04-07 ENCOUNTER — Ambulatory Visit (HOSPITAL_COMMUNITY)
Admission: EM | Admit: 2024-04-07 | Discharge: 2024-04-07 | Disposition: A | Discharge status: Home / Self Care | Attending: Sports Medicine | Admitting: Sports Medicine

## 2024-04-07 ENCOUNTER — Other Ambulatory Visit: Payer: Self-pay

## 2024-04-07 ENCOUNTER — Encounter (HOSPITAL_COMMUNITY): Payer: Self-pay | Admitting: Emergency Medicine

## 2024-04-07 DIAGNOSIS — M5030 Other cervical disc degeneration, unspecified cervical region: Secondary | ICD-10-CM | POA: Diagnosis not present

## 2024-04-07 MED ORDER — IBUPROFEN 800 MG PO TABS: 800.0000 mg | ORAL_TABLET | Freq: Three times a day (TID) | ORAL | 0 refills | Status: DC

## 2024-04-07 MED ORDER — CYCLOBENZAPRINE HCL 10 MG PO TABS: 10.0000 mg | ORAL_TABLET | Freq: Three times a day (TID) | ORAL | 0 refills | Status: DC | PRN

## 2024-04-07 MED ORDER — KETOROLAC TROMETHAMINE 30 MG/ML IJ SOLN
60.0000 mg | Freq: Once | INTRAMUSCULAR | Status: AC
  Administered 2024-04-07: 60 mg via INTRAMUSCULAR

## 2024-04-07 MED ORDER — KETOROLAC TROMETHAMINE 60 MG/2ML IM SOLN
INTRAMUSCULAR | Status: AC
Start: 2024-04-07 — End: ?
  Filled 2024-04-07: qty 2

## 2024-04-07 NOTE — ED Provider Notes (Signed)
 [facility]    CSN: [id number] Arrival date & time: 04/07/24  0846      History   Chief Complaint Chief Complaint  Patient presents with   Torticollis    HPI Whitney Benton is a 53 y.o. female with 3 days of acute exacerbation of neck pain. She has had issues with this in the past. She denies any inciting injury. Locates pain in bilateral neck, R>L. No radicular pain or paresthesias down bilateral upper extremities. Has been taking ibuprofen without much benefit, last dose shortly after midnight last night.  She does have uncontrolled diabetes, last A1c from 01/2024 reviewed and was 9.8.  HPI  Past Medical History:  Diagnosis Date   Allergy    Anxiety    Arthritis    Asthma    Depression    Diabetes mellitus without complication (HCC)    GERD (gastroesophageal reflux disease)    Glaucoma suspect    Hyperlipidemia    Trichomonas infection     Patient Active Problem List   Diagnosis Date Noted   Intractable nausea and vomiting 12/23/2023   Diabetic gastroparesis (HCC) 12/22/2023   DKA, type 1 (HCC) 06/20/2023   Essential hypertension 06/20/2023   Vitamin D deficiency 06/20/2023   Hyperlipidemia 03/06/2023   Primary insomnia 12/27/2022   Gallstone pancreatitis 12/03/2022   Chronic cholecystitis with calculus 12/03/2022   Morbid obesity (HCC) 04/04/2022   Former smoker 04/30/2021   Major depressive disorder, recurrent episode, moderate (HCC) 04/30/2021   Substance induced mood disorder (HCC) 03/22/2021   Generalized anxiety disorder 03/22/2021   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   DKA (diabetic ketoacidosis) (HCC) 01/23/2021   Glaucoma suspect 04/12/2020   DKA, type 2, not at goal [facility]) 10/02/2019   AKI (acute kidney injury) (HCC) 10/02/2019   Hyperkalemia 10/02/2019   Leukocytosis 10/02/2019   Abnormal LFTs 10/02/2019   Stressful life events affecting family and household 08/06/2019   New onset type 2 diabetes mellitus (HCC)  02/04/2019   Morbid obesity with BMI of 40.0-44.9, adult (HCC) 02/04/2019   Tobacco dependence 02/04/2019   Left arm weakness 12/06/2014   Paresthesias/numbness 12/06/2014   Tobacco abuse 11/14/2014   Depression    Mixed incontinence 06/27/2014   History of TVH in 2006 for fibroids and menorrhagia; benign pathology 09/08/2011    Past Surgical History:  Procedure Laterality Date   CESAREAN SECTION     UPPER GASTROINTESTINAL ENDOSCOPY     VAGINAL HYSTERECTOMY  12/23/2004   Fibroids, menorrhagia, benign pathology    OB History     Gravida  5   Para  3   Term  3   Preterm      AB  2   Living  3      SAB  2   IAB  0   Ectopic  0   Multiple  0   Live Births               Home Medications    Prior to Admission medications   Medication Sig Start Date End Date Taking? Authorizing Provider  cyclobenzaprine (FLEXERIL) 10 MG tablet Take 1 tablet (10 mg total) by mouth 3 (three) times daily as needed for muscle spasms. 04/07/24  Yes Marisa Cyphers, MD  ibuprofen (ADVIL) 800 MG tablet Take 1 tablet (800 mg total) by mouth 3 (three) times daily. 04/07/24  Yes Marisa Cyphers, MD  Accu-Chek Softclix Lancets lancets Use to check blood sugar 3 times daily 08/11/23  Lawrance Presume, MD  albuterol (VENTOLIN HFA) 108 (90 Base) MCG/ACT inhaler Inhale 2 puffs into the lungs every 6 (six) hours as needed for wheezing or shortness of breath (Cough). 02/26/23   Lawrance Presume, MD  amLODipine (NORVASC) 10 MG tablet Take 1 tablet (10 mg total) by mouth daily. 05/07/23   Hassie Lint, PA-C  Blood Glucose Monitoring Suppl (ACCU-CHEK GUIDE) w/Device KIT Use to check blood sugar 3 times daily. 08/11/23   Lawrance Presume, MD  Blood Pressure Monitoring (BLOOD PRESSURE CUFF) MISC 1 each by Does not apply route daily. Please provide Large cuff 03/23/24   Gerald Kitty., NP  busPIRone (BUSPAR) 15 MG tablet Take 1 tablet (15 mg total) by mouth 3 (three) times daily. 03/30/24    Arlyne Bering, NP  Continuous Glucose Sensor (DEXCOM G7 SENSOR) MISC Change sensor every 10 days. 03/03/24   Motwani, Komal, MD  cycloSPORINE (RESTASIS) 0.05 % ophthalmic emulsion Apply 1 drop into both eyes twice a day 09/19/21     DULoxetine (CYMBALTA) 20 MG capsule Take 1 capsule (20 mg total) by mouth every morning. 03/30/24   Arlyne Bering, NP  DULoxetine (CYMBALTA) 60 MG capsule Take 1 capsule (60 mg total) by mouth daily. 03/30/24   Arlyne Bering, NP  Erenumab-aooe (AIMOVIG) 140 MG/ML SOAJ Inject 140 mg into the skin every 28 (twenty-eight) days. 03/11/24   Merriam Abbey, DO  furosemide (LASIX) 20 MG tablet Take 1 tablet (20 mg total) by mouth daily as needed (weight gain). 03/23/24   Gerald Kitty., NP  gabapentin (NEURONTIN) 300 MG capsule Take 1 capsule (300 mg total) by mouth 3 (three) times daily. 03/30/24   Parsons, Brittney E, NP  Glucagon (BAQSIMI ONE PACK) 3 MG/DOSE POWD Place 1 Device into the nose as needed (Low blood sugar with impaired consciousness). 11/24/23   Motwani, Komal, MD  glucose blood (ACCU-CHEK GUIDE) test strip Use to check blood sugar 3 times daily 08/11/23   Lawrance Presume, MD  hydrOXYzine (ATARAX) 50 MG tablet Take 1 tablet (50 mg total) by mouth 3 (three) times daily. 03/30/24   Arlyne Bering, NP  Insulin Pen Needle (PEN NEEDLES 3/16") 31G X 5 MM MISC Use as directed with insulin pen 05/07/23   Hassie Lint, PA-C  insulin regular human CONCENTRATED (HUMULIN R U-500 KWIKPEN) 500 UNIT/ML KwikPen Inject 70 Units into the skin daily with breakfast AND 50 Units daily before lunch AND 60 Units daily before supper. Take 30 minutes before meals. 02/16/24   Lawrance Presume, MD  metoCLOPramide (REGLAN) 10 MG tablet Take 1 tablet (10 mg total) by mouth 3 (three) times daily before meals. Patient taking differently: Take 10 mg by mouth in the morning. 11/14/23   Newlin, Enobong, MD  metoprolol tartrate (LOPRESSOR) 25 MG tablet Take 1 tablet (25 mg total)  by mouth 2 (two) times daily. 03/23/24   Gerald Kitty., NP  Multiple Vitamins-Minerals (EYE VITAMINS PO) Take 1 tablet by mouth daily.    [provider]  OLANZapine-Samidorphan (LYBALVI) 15-10 MG TABS Take 15 mg by mouth at bedtime. 03/30/24   Arlyne Bering, NP  omeprazole (PRILOSEC) 40 MG capsule TAKE 1 CAPSULE BY MOUTH ONCE DAILY 30 MINUTES BEFORE BREAKFAST 03/10/23   Arlee Bellows, NP  ondansetron (ZOFRAN-ODT) 4 MG disintegrating tablet Take 1 tablet (4 mg total) by mouth every 8 (eight) hours as needed. 11/12/23   Lucina Sabal, PA-C  QUEtiapine (SEROQUEL)  25 MG tablet Take 1 tablet (25 mg total) by mouth at bedtime as needed. Patient taking differently: Take 25 mg by mouth at bedtime as needed. HAS NOT STARTED.  HAS NOT PICKED THIS UP-04/07/2024 03/30/24   Toy Cookey E, NP  rosuvastatin (CRESTOR) 40 MG tablet Take 1 tablet (40 mg total) by mouth daily. 04/06/24   Motwani, Carin Hock, MD  SUMAtriptan (IMITREX) 100 MG tablet TAKE 1 TABLET EARLIEST ONSET OF MIGRAINE. MAY REPEAT IN 2 HOURS IF HEADACHE PERSISTS OR RECURS. MAXIMUM 2 TABLETS IN 24 HOURS. 03/11/24 03/11/25  Drema Dallas, DO  traZODone (DESYREL) 50 MG tablet Take 1-2 tablets (50-100 mg total) by mouth at bedtime as needed for sleep. 03/30/24   Shanna Cisco, NP  vitamin B-12 (CYANOCOBALAMIN) 50 MCG tablet Take 50 mcg by mouth daily.    [provider]  Vitamin D, Cholecalciferol, 10 MCG (400 UNIT) CAPS Take 400 Int'l Units/day by mouth daily. 09/10/23   Marcine Matar, MD  cetirizine (ZYRTEC) 10 MG tablet Take 1 tablet (10 mg total) by mouth daily. Patient not taking: No sig reported 03/31/19 03/03/20  Hoy Register, MD    Family History Family History  Problem Relation Age of Onset   Diabetes Mother    Mental illness Mother    Depression Mother    Hypertension Mother    Colon cancer Father 69   Hypertension Father    Diabetes Father    Colon cancer Paternal Grandmother    Esophageal cancer Neg Hx     Stomach cancer Neg Hx    Rectal cancer Neg Hx     Social History Social History   Tobacco Use   Smoking status: Former    Current packs/day: 0.00    Average packs/day: 0.3 packs/day for 8.0 years (2.0 ttl pk-yrs)    Types: Cigarettes    Start date: 2013    Quit date: 2021    Years since quitting: 4.2   Smokeless tobacco: Never  Vaping Use   Vaping status: Never Used  Substance Use Topics   Alcohol use: Not Currently    Comment: occasional   Drug use: Not Currently    Types: Marijuana    Comment: occ     Allergies   Aspirin, Trulicity [dulaglutide], and Oxycodone   Review of Systems Review of Systems   Physical Exam Triage Vital Signs ED Triage Vitals  Encounter Vitals Group     BP 04/07/24 0944 119/80     Systolic BP Percentile --      Diastolic BP Percentile --      Pulse Rate 04/07/24 0944 77     Resp 04/07/24 0944 20     Temp 04/07/24 0944 98.5 F (36.9 C)     Temp Source 04/07/24 0944 Oral     SpO2 04/07/24 0944 96 %     Weight --      Height --      Head Circumference --      Peak Flow --      Pain Score 04/07/24 0941 10     Pain Loc --      Pain Education --      Exclude from Growth Chart --    No data found.  Updated Vital Signs BP 119/80 (BP Location: Left Arm) Comment (BP Location): LARGE CUFF  Pulse 77   Temp 98.5 F (36.9 C) (Oral)   Resp 20   SpO2 96%   Visual Acuity Right Eye Distance:   Left Eye  Distance:   Bilateral Distance:    Right Eye Near:   Left Eye Near:    Bilateral Near:     Physical Exam Constitutional:      General: She is not in acute distress.    Appearance: She is obese. She is not ill-appearing, toxic-appearing or diaphoretic.  HENT:     Head: Normocephalic and atraumatic.  Eyes:     Extraocular Movements: Extraocular movements intact.     Conjunctiva/sclera: Conjunctivae normal.     Pupils: Pupils are equal, round, and reactive to light.  Neck:     Comments: No gross deformity, swelling,  bruising. TTP diffusely across cervical paraspinals and trapezius, R>L.  No midline/bony TTP. ROM severely limited to 10 L lateral flexion, 5d R lateral flexion, 0d extension, 30d forward flexion. BUE strength 5/5.   Sensation intact to light touch.   Negative spurlings. NV intact distal BUEs.  Cardiovascular:     Rate and Rhythm: Normal rate.     Pulses: Normal pulses.  Pulmonary:     Effort: Pulmonary effort is normal.     Breath sounds: Normal breath sounds.  Skin:    General: Skin is warm.     Capillary Refill: Capillary refill takes less than 2 seconds.     Findings: No lesion or rash.  Neurological:     General: No focal deficit present.     Mental Status: She is alert and oriented to person, place, and time.  Psychiatric:        Mood and Affect: Mood normal.        Behavior: Behavior normal.      UC Treatments / Results  Labs (all labs ordered are listed, but only abnormal results are displayed) Labs Reviewed - No data to display  EKG   Radiology No results found.  Procedures Procedures (including critical care time)  Medications Ordered in UC Medications  ketorolac (TORADOL) 30 MG/ML injection 60 mg (has no administration in time range)    Initial Impression / Assessment and Plan / UC Course  I have reviewed the triage vital signs and the nursing notes.  Pertinent labs & imaging results that were available during my care of the patient were reviewed by me and considered in my medical decision making (see chart for details).    Degenerative disc disease, cervical Overall, vitals and exam are reassuring and without red flags. Review of C-spine CT from 10.2023 shows multilevel DDD in C-spin with some foraminal narrowing on the left. Reassuringly no radicular symptoms today involving BUEs. Presentation most c/w acute on chronic flare of cervical DDD. Unfortunately she has uncontrolled diabetes, most recent A1c 9.8 so not a candidate for steroids today. IM  toradol given today and Rx for Ibuprofen 800mg  TID and Flexeril 10mg  TID prn x 2 weeks. Also recommended Tylenol 1000mg  TID and heating pad/ice alternating. Referral for PT provided. F/u with PCP if failing to improve to consider referral to orthopedics vs NSGY.  Return and ER precautions discussed Patient's questions were answered and they are in agreement with this plan  Final Clinical Impressions(s) / UC Diagnoses   Final diagnoses:  Degenerative disc disease, cervical   Discharge Instructions   None    ED Prescriptions     Medication Sig Dispense Auth. Provider   ibuprofen (ADVIL) 800 MG tablet Take 1 tablet (800 mg total) by mouth 3 (three) times daily. 42 tablet Marisa Cyphers, MD   cyclobenzaprine (FLEXERIL) 10 MG tablet Take 1 tablet (10 mg total)  by mouth 3 (three) times daily as needed for muscle spasms. 30 tablet Quinnten Calvin D, MD      PDMP not reviewed this encounter.   Marliss Simple, MD 04/07/24 1020

## 2024-04-07 NOTE — ED Notes (Signed)
 Received additional exercise paper

## 2024-04-07 NOTE — ED Triage Notes (Signed)
 Neck pain for 3 days.  Patient states she does not remember what was going on when pain started.  When asked if pain was in back of neck or one side or the other, patient responded "all of the above".    Patient did take tylenol.    Patient denies any fall

## 2024-04-07 NOTE — Telephone Encounter (Signed)
 Pharmacy Patient Advocate Encounter  Received notification from [employer] that Prior Authorization for AIMOVIG 140MG  has been APPROVED from 3.26.25 to 3.26.26   PA #/Case ID/Reference #: [id number]

## 2024-04-07 NOTE — Discharge Instructions (Addendum)
 You neck pain is from degenerative discs in your cervical spine We did an injection of Toradol today to help with pain over the next 24 hours. I recommend gentle stretches of your neck and taking Ibuprofen 800mg  every 8 hours and Tylenol 1000mg  every 8 hours as needed for pain. I have also sent you a prescription for muscle relaxer, Flexeril, that can be taken every 8 hours as needed as well (caution this can be sedating - recommend avoiding operation of heavy machinery/driving while on this). Icing and heating pads can also help. Referral placed to physical therapy to help correct this issue and build strength in your neck.  Follow-up with your PCP in 4-6 weeks if this isn't improving for consideration of referral to orthopedics or neurosurgery.

## 2024-04-08 ENCOUNTER — Other Ambulatory Visit: Payer: Self-pay

## 2024-04-14 ENCOUNTER — Encounter: Payer: Self-pay | Admitting: "Endocrinology

## 2024-04-14 NOTE — Progress Notes (Unsigned)
 04/14/2024 Whitney Benton [id number] 1971-05-27   Chief Complaint: Gastroparesis follow up  History of Present Illness: Whitney Benton is a 53 year old female with a past medical history of anxiety, depression, asthma, DM type II, headaches, hepatic steatosis. She is known by Dr. Elvin Hammer.      Latest Ref Rng & Units 12/24/2023    4:05 AM 12/23/2023    5:48 AM 12/23/2023   12:57 AM  CBC  WBC 4.0 - 10.5 K/uL 7.3  10.5  11.6   Hemoglobin 12.0 - 15.0 g/dL 40.9  81.1  91.4   Hematocrit 36.0 - 46.0 % 36.8  39.0  40.1   Platelets 150 - 400 K/uL 408  420  488        Latest Ref Rng & Units 03/23/2024    9:34 AM 12/24/2023    4:05 AM 12/23/2023    5:48 AM  CMP  Glucose 70 - 99 mg/dL 782  74  956   BUN 6 - 24 mg/dL 10  19  12    Creatinine 0.57 - 1.00 mg/dL 2.13  0.86  5.78   Sodium 134 - 144 mmol/L 138  141  131   Potassium 3.5 - 5.2 mmol/L 4.9  3.8  3.9   Chloride 96 - 106 mmol/L 99  108  101   CO2 20 - 29 mmol/L 25  27  19    Calcium  8.7 - 10.2 mg/dL 9.6  9.2  8.4   Total Protein 6.0 - 8.5 g/dL 6.9  7.3    Total Bilirubin 0.0 - 1.2 mg/dL 0.3  0.4    Alkaline Phos 44 - 121 IU/L 170  95    AST 0 - 40 IU/L 26  21    ALT 0 - 32 IU/L 21  22      ECHO 03/14/2023: IMPRESSIONS Left ventricular ejection fraction, by estimation, is 50 to 55%. Left ventricular ejection fraction by PLAX is 51 %. The left ventricle has low normal function. The left ventricle has no regional wall motion abnormalities. There is mild concentric left ventricular hypertrophy. Left ventricular diastolic parameters are consistent with Grade I diastolic dysfunction (impaired relaxation). 1. 2. Right ventricular systolic function is normal. The right ventricular size is normal. The mitral valve is normal in structure. Trivial mitral valve regurgitation. No evidence of mitral stenosis. 3. The aortic valve is normal in structure. Aortic valve regurgitation is trivial. No aortic stenosis is present. 4. The inferior vena cava is  normal in size with greater than 50% respiratory variability, suggesting right atrial pressure of 3 mmHg.  CTAP with contrast 12/22/2023: FINDINGS: Lower chest: No acute abnormality.   Hepatobiliary: There is diffuse fatty infiltration of the liver parenchyma. A stable 1.7 cm x 3.7 cm x 5.1 cm area of parenchymal low attenuation (approximately 60.02 Hounsfield units) is seen within the posteromedial aspect of the right lobe of the liver (segment 4). The gallbladder is not identified. There is no evidence of biliary dilatation.   Pancreas: Unremarkable. No pancreatic ductal dilatation or surrounding inflammatory changes.   Spleen: Normal in size without focal abnormality.   Adrenals/Urinary Tract: Adrenal glands are unremarkable. Kidneys are normal, without obstructing renal calculi, focal lesion, or hydronephrosis. A 2 mm nonobstructing renal calculus is seen within the mid left kidney. The urinary bladder is poorly distended and subsequently limited in evaluation. Mild diffuse urinary bladder wall thickening is noted.   Stomach/Bowel: Stomach is within normal limits. Appendix appears normal. No evidence of  bowel wall thickening, distention, or inflammatory changes.   Vascular/Lymphatic: No significant vascular findings are present. No enlarged abdominal or pelvic lymph nodes.   Reproductive: Status post hysterectomy. No adnexal masses.   Other: No abdominal wall hernia or abnormality. No abdominopelvic ascites.   Musculoskeletal: No acute or significant osseous findings.   IMPRESSION: 1. Hepatic steatosis. 2. Stable area of focal fat within the posteromedial aspect of the right lobe of the liver, as confirmed on prior MR abdomen (dated October 05, 2023). 3. Mild diffuse urinary bladder wall thickening which may be secondary to poor distention. Correlation with urinalysis is recommended to exclude cystitis. 4. 2 mm nonobstructing left renal calculus. 5. Evidence of  prior hysterectomy.   Liver MRI with and without contrast 10/05/2023: FINDINGS: Lower chest: Lung bases are clear.   Hepatobiliary: Focal fat along the posterior aspect of segment 4 (series 9/image 16). No suspicious/enhancing hepatic lesions.   Status post cholecystectomy. No intrahepatic or extrahepatic dilatation.   Pancreas:  Within normal limits.   Spleen:  Within normal limits   Adrenals/Urinary Tract:  Adrenal glands are within normal limits.   Kidneys are within normal limits.  No hydronephrosis.   Stomach/Bowel: Stomach is within normal limits.   Visualized bowel is unremarkable.   Vascular/Lymphatic:  No evidence of aortic aneurysm.   No suspicious abdominal lymphadenopathy.   Other:  No abdominal ascites.   Musculoskeletal: No focal osseous lesions.   IMPRESSION: Focal fat along the posterior aspect of segment 4, corresponding to the finding on CT, benign. No suspicious/enhancing hepatic lesions.   Status post cholecystectomy.  Gastric empty study 07/21/2023: FINDINGS: Expected location of the stomach in the left upper quadrant. Ingested meal empties the stomach gradually over the course of the study.   26% emptied at 1 hr ( normal >= 10%)   51% emptied at 2 hr ( normal >= 40%)   69% emptied at 3 hr ( normal >= 70%)   81% emptied at 4 hr ( normal >= 90%)   IMPRESSION: Scintigraphic findings compatible with delayed 4 hour gastric Emptying  PAST GI PROCEDURES:   EGD 10/17/2022 done due to nausea and epigastric pain: 1. Gastric erosions  2. Otherwise normal EGD.   Colonoscopy 07/09/2022: - One 1 mm polyp in the cecum, removed with a cold snare. Resected and retrieved.  - The examination was otherwise normal on direct and retroflexion views.  -Recall colonoscopy 5 years due to family history of colon cancer     Current Medications, Allergies, Past Medical History, Past Surgical History, Family History and Social History were reviewed in  [facility] record.   Review of Systems:   Constitutional: Negative for fever, sweats, chills or weight loss.  Respiratory: Negative for shortness of breath.   Cardiovascular: Negative for chest pain, palpitations and leg swelling.  Gastrointestinal: See HPI.  Musculoskeletal: Negative for back pain or muscle aches.  Neurological: Negative for dizziness, headaches or paresthesias.    Physical Exam: There were no vitals taken for this visit. General: in no acute distress. Head: Normocephalic and atraumatic. Eyes: No scleral icterus. Conjunctiva pink . Ears: Normal auditory acuity. Mouth: Dentition intact. No ulcers or lesions.  Lungs: Clear throughout to auscultation. Heart: Regular rate and rhythm, no murmur. Abdomen: Soft, nontender and nondistended. No masses or hepatomegaly. Normal bowel sounds x 4 quadrants.  Rectal: Deferred.  Musculoskeletal: Symmetrical with no gross deformities. Extremities: No edema. Neurological: Alert oriented x 4. No focal deficits.  Psychological: Alert and cooperative. Normal  mood and affect  Assessment and Recommendations:  53 year old female with gastroparesis   DM type II  Elevated Alk phos level -Hepatic panel, GGT

## 2024-04-15 ENCOUNTER — Encounter: Payer: Self-pay | Admitting: Nurse Practitioner

## 2024-04-15 ENCOUNTER — Ambulatory Visit: Admitting: Nurse Practitioner

## 2024-04-15 VITALS — BP 120/72 | HR 84 | Ht 63.0 in | Wt 239.2 lb

## 2024-04-15 DIAGNOSIS — Z794 Long term (current) use of insulin: Secondary | ICD-10-CM | POA: Diagnosis not present

## 2024-04-15 DIAGNOSIS — E1143 Type 2 diabetes mellitus with diabetic autonomic (poly)neuropathy: Secondary | ICD-10-CM | POA: Diagnosis not present

## 2024-04-15 DIAGNOSIS — K760 Fatty (change of) liver, not elsewhere classified: Secondary | ICD-10-CM | POA: Diagnosis not present

## 2024-04-15 DIAGNOSIS — K3184 Gastroparesis: Secondary | ICD-10-CM

## 2024-04-15 DIAGNOSIS — Z800 Family history of malignant neoplasm of digestive organs: Secondary | ICD-10-CM

## 2024-04-15 NOTE — Patient Instructions (Addendum)
 Continue Reglan  10 mg- take 1 by mouth every 8 hours as needed for nausea and vomiting. May take daily if needed.  Follow up in 6 months.  Reduce carbohydrates in your diet: rice, pasta, bread  Exercise as tolerated.  Due to recent changes in healthcare laws, you may see the results of your imaging and laboratory studies on MyChart before your provider has had a chance to review them.  We understand that in some cases there may be results that are confusing or concerning to you. Not all laboratory results come back in the same time frame and the provider may be waiting for multiple results in order to interpret others.  Please give us  48 hours in order for your provider to thoroughly review all the results before contacting the office for clarification of your results.   Thank you for trusting me with your gastrointestinal care!   Everett Hitt, CRNP

## 2024-04-16 ENCOUNTER — Ambulatory Visit

## 2024-04-20 NOTE — Progress Notes (Signed)
 Noted.

## 2024-04-23 ENCOUNTER — Ambulatory Visit (INDEPENDENT_AMBULATORY_CARE_PROVIDER_SITE_OTHER): Admitting: "Endocrinology

## 2024-04-23 ENCOUNTER — Encounter: Payer: Self-pay | Admitting: "Endocrinology

## 2024-04-23 VITALS — BP 130/86 | HR 95 | Ht 63.0 in | Wt 230.0 lb

## 2024-04-23 DIAGNOSIS — E7800 Pure hypercholesterolemia, unspecified: Secondary | ICD-10-CM

## 2024-04-23 DIAGNOSIS — E1065 Type 1 diabetes mellitus with hyperglycemia: Secondary | ICD-10-CM | POA: Diagnosis not present

## 2024-04-23 NOTE — Progress Notes (Signed)
 Outpatient Endocrinology Note Whitney Newcomer, MD  04/23/24   Whitney Benton 1971-03-27 [id number]  Referring Provider: Lawrance Presume, MD Primary Care Provider: Lawrance Presume, MD Reason for consultation: Subjective   Assessment & Plan  Diagnoses and all orders for this visit:  Uncontrolled type 1 diabetes mellitus with hyperglycemia (HCC)  Pure hypercholesterolemia    Diabetes Type I complicated by hyperglycemia, c-peptide -ve, GAD Ab + Lab Results  Component Value Date   GFR 78.96 10/09/2022   Hba1c goal less than 7, current Hba1c is  Lab Results  Component Value Date   HGBA1C 9.8 (H) 02/18/2024   Will recommend the following: U500 80 units before break fast, 45 units before lunch and 50 units before supper, 30 min before meals Decrease by 5 units if BG is <70  Has DexCom G7  Patient experiencing  personal life issues-extensively counseled   Stopped Metformin  XR 500 mg bid - max tolerated dose. Patient did not experience any benefit from, only GI S/E  Changed to DexCom G7 from [place] 3 per pt request as Jerrilyn Moras keeps falling off, pt unable to afford skin tac Stopped Toujeo  78 units qam and Humalog  26 units tidac 15 min before meals Was taken off of Trulicity  due to stomach issues  No known contraindications/side effects to any of above medications Glucagon  discussed and prescribed with refills on 11/24/23  -Last LD and Tg are as follows: Lab Results  Component Value Date   LDLCALC 105 (H) 03/31/2024    Lab Results  Component Value Date   TRIG 142 03/31/2024   -On atorvastatin  40 mg every day -04/06/24: start rosuvastatin  40 mg every day -Follow low fat diet and exercise   -Blood pressure goal <140/90 - Microalbumin/creatinine goal is < 30 -Last MA/Cr is as follows: Lab Results  Component Value Date   MICROALBUR 0.6 11/17/2023   -not on ACE/ARB  -diet changes including salt restriction -limit eating outside -counseled BP targets per  standards of diabetes care -uncontrolled blood pressure can lead to retinopathy, nephropathy and cardiovascular and atherosclerotic heart disease  Reviewed and counseled on: -A1C target -Blood sugar targets -Complications of uncontrolled diabetes  -Checking blood sugar before meals and bedtime and bring log next visit -All medications with mechanism of action and side effects -Hypoglycemia management: rule of 15's, Glucagon  Emergency Kit and medical alert ID -low-carb low-fat plate-method diet -At least 20 minutes of physical activity per day -Annual dilated retinal eye exam and foot exam -compliance and follow up needs -follow up as scheduled or earlier if problem gets worse  Call if blood sugar is less than 70 or consistently above 250    Take a 15 gm snack of carbohydrate at bedtime before you go to sleep if your blood sugar is less than 100.    If you are going to fast after midnight for a test or procedure, ask your physician for instructions on how to reduce/decrease your insulin  dose.    Call if blood sugar is less than 70 or consistently above 250  -Treating a low sugar by rule of 15  (15 gms of sugar every 15 min until sugar is more than 70) If you feel your sugar is low, test your sugar to be sure If your sugar is low (less than 70), then take 15 grams of a fast acting Carbohydrate (3-4 glucose tablets or glucose gel or 4 ounces of juice or regular soda) Recheck your sugar 15 min after treating low to make  sure it is more than 70 If sugar is still less than 70, treat again with 15 grams of carbohydrate          Don't drive the hour of hypoglycemia  If unconscious/unable to eat or drink by mouth, use glucagon  injection or nasal spray baqsimi  and call 911. Can repeat again in 15 min if still unconscious.  Return in about 1 month (around 05/24/2024).   I have reviewed current medications, nurse's notes, allergies, vital signs, past medical and surgical history, family medical  history, and social history for this encounter. Counseled patient on symptoms, examination findings, lab findings, imaging results, treatment decisions and monitoring and prognosis. The patient understood the recommendations and agrees with the treatment plan. All questions regarding treatment plan were fully answered.  Whitney Newcomer, MD  04/23/24    History of Present Illness Whitney Benton is a 53 y.o. year old female who presents for follow up of Type I diabetes mellitus.  Whitney Benton was first diagnosed in 2020.   Diabetes education +  Home diabetes regimen: U500 70 units before break fast, 50 units before lunch and 60 units before supper, 30 min before meals  Previously on, Toujeo  72 units qam Humalog  24 units tidac 15 min before meals Metformin  XR 500 mg bid - gets diarrhea   COMPLICATIONS -  MI/Stroke -  retinopathy -  neuropathy -  nephropathy  BLOOD SUGAR DATA  CGM interpretation: At today's visit, we reviewed her CGM downloads. The full report is scanned in the media. Reviewing the CGM trends, BG are elevated in morning with lows in evening/overnight.   Physical Exam  BP 130/86   Pulse 95   Ht 5\' 3"  (1.6 m)   Wt 230 lb (104.3 kg)   SpO2 98%   BMI 40.74 kg/m    Constitutional: well developed, well nourished Head: normocephalic, atraumatic Eyes: sclera anicteric, no redness Neck: supple Lungs: normal respiratory effort Neurology: alert and oriented Skin: dry, no appreciable rashes Musculoskeletal: no appreciable defects Psychiatric: normal mood and affect Diabetic Foot Exam - Simple   No data filed      Current Medications Patient's Medications  New Prescriptions   No medications on file  Previous Medications   ACCU-CHEK SOFTCLIX LANCETS LANCETS    Use to check blood sugar 3 times daily   ALBUTEROL  (VENTOLIN  HFA) 108 (90 BASE) MCG/ACT INHALER    Inhale 2 puffs into the lungs every 6 (six) hours as needed for wheezing or shortness of breath (Cough).    AMLODIPINE  (NORVASC ) 10 MG TABLET    Take 1 tablet (10 mg total) by mouth daily.   BLOOD GLUCOSE MONITORING SUPPL (ACCU-CHEK GUIDE) W/DEVICE KIT    Use to check blood sugar 3 times daily.   BLOOD PRESSURE MONITORING (BLOOD PRESSURE CUFF) MISC    1 each by Does not apply route daily. Please provide Large cuff   BUSPIRONE  (BUSPAR ) 15 MG TABLET    Take 1 tablet (15 mg total) by mouth 3 (three) times daily.   CONTINUOUS GLUCOSE SENSOR (DEXCOM G7 SENSOR) MISC    Change sensor every 10 days.   CYCLOBENZAPRINE  (FLEXERIL ) 10 MG TABLET    Take 1 tablet (10 mg total) by mouth 3 (three) times daily as needed for muscle spasms.   CYCLOSPORINE  (RESTASIS ) 0.05 % OPHTHALMIC EMULSION    Apply 1 drop into both eyes twice a day   DULOXETINE  (CYMBALTA ) 20 MG CAPSULE    Take 1 capsule (20 mg total) by mouth  every morning.   DULOXETINE  (CYMBALTA ) 60 MG CAPSULE    Take 1 capsule (60 mg total) by mouth daily.   ERENUMAB -AOOE (AIMOVIG ) 140 MG/ML SOAJ    Inject 140 mg into the skin every 28 (twenty-eight) days.   FUROSEMIDE  (LASIX ) 20 MG TABLET    Take 1 tablet (20 mg total) by mouth daily as needed (weight gain).   GABAPENTIN  (NEURONTIN ) 300 MG CAPSULE    Take 1 capsule (300 mg total) by mouth 3 (three) times daily.   GLUCAGON  (BAQSIMI  ONE PACK) 3 MG/DOSE POWD    Place 1 Device into the nose as needed (Low blood sugar with impaired consciousness).   GLUCOSE BLOOD (ACCU-CHEK GUIDE) TEST STRIP    Use to check blood sugar 3 times daily   HYDROXYZINE  (ATARAX ) 50 MG TABLET    Take 1 tablet (50 mg total) by mouth 3 (three) times daily.   IBUPROFEN  (ADVIL ) 800 MG TABLET    Take 1 tablet (800 mg total) by mouth 3 (three) times daily.   INSULIN  PEN NEEDLE (PEN NEEDLES 3/16") 31G X 5 MM MISC    Use as directed with insulin  pen   INSULIN  REGULAR HUMAN CONCENTRATED (HUMULIN  R U-500 KWIKPEN) 500 UNIT/ML KWIKPEN    Inject 70 Units into the skin daily with breakfast AND 50 Units daily before lunch AND 60 Units daily before supper.  Take 30 minutes before meals.   METOCLOPRAMIDE  (REGLAN ) 10 MG TABLET    Take 1 tablet (10 mg total) by mouth 3 (three) times daily before meals.   METOPROLOL  TARTRATE (LOPRESSOR ) 25 MG TABLET    Take 1 tablet (25 mg total) by mouth 2 (two) times daily.   MULTIPLE VITAMINS-MINERALS (EYE VITAMINS PO)    Take 1 tablet by mouth daily.   OLANZAPINE -SAMIDORPHAN (LYBALVI ) 15-10 MG TABS    Take 15 mg by mouth at bedtime.   OMEPRAZOLE  (PRILOSEC) 40 MG CAPSULE    TAKE 1 CAPSULE BY MOUTH ONCE DAILY 30 MINUTES BEFORE BREAKFAST   ONDANSETRON  (ZOFRAN -ODT) 4 MG DISINTEGRATING TABLET    Take 1 tablet (4 mg total) by mouth every 8 (eight) hours as needed.   QUETIAPINE  (SEROQUEL ) 25 MG TABLET    Take 1 tablet (25 mg total) by mouth at bedtime as needed.   ROSUVASTATIN  (CRESTOR ) 40 MG TABLET    Take 1 tablet (40 mg total) by mouth daily.   SUMATRIPTAN  (IMITREX ) 100 MG TABLET    TAKE 1 TABLET EARLIEST ONSET OF MIGRAINE. MAY REPEAT IN 2 HOURS IF HEADACHE PERSISTS OR RECURS. MAXIMUM 2 TABLETS IN 24 HOURS.   TRAZODONE  (DESYREL ) 50 MG TABLET    Take 1-2 tablets (50-100 mg total) by mouth at bedtime as needed for sleep.   VITAMIN B-12 (CYANOCOBALAMIN) 50 MCG TABLET    Take 50 mcg by mouth daily.   VITAMIN D , CHOLECALCIFEROL , 10 MCG (400 UNIT) CAPS    Take 400 Int'l Units/day by mouth daily.  Modified Medications   No medications on file  Discontinued Medications   No medications on file    Allergies Allergies  Allergen Reactions   Aspirin Hives   Trulicity  [Dulaglutide ] Other (See Comments)    DC'd due to chronic gastric and abdominal pain with nausea   Oxycodone  Nausea And Vomiting    Past Medical History Past Medical History:  Diagnosis Date   Allergy    Anxiety    Arthritis    Asthma    Depression    Diabetes mellitus without complication (HCC)    GERD (  gastroesophageal reflux disease)    Glaucoma suspect    Hyperlipidemia    Trichomonas infection     Past Surgical History Past Surgical  History:  Procedure Laterality Date   CESAREAN SECTION     UPPER GASTROINTESTINAL ENDOSCOPY     VAGINAL HYSTERECTOMY  12/23/2004   Fibroids, menorrhagia, benign pathology    Family History family history includes Colon cancer in her paternal grandmother; Colon cancer (age of onset: 18) in her father; Depression in her mother; Diabetes in her father and mother; Hypertension in her father and mother; Mental illness in her mother.  Social History Social History   Socioeconomic History   Marital status: Significant Other    Spouse name: Not on file   Number of children: 3   Years of education: 14   Highest education level: Not on file  Occupational History   Not on file  Tobacco Use   Smoking status: Former    Current packs/day: 0.00    Average packs/day: 0.3 packs/day for 8.0 years (2.0 ttl pk-yrs)    Types: Cigarettes    Start date: 2013    Quit date: 2021    Years since quitting: 4.3   Smokeless tobacco: Never  Vaping Use   Vaping status: Never Used  Substance and Sexual Activity   Alcohol use: Not Currently    Comment: occasional   Drug use: Not Currently    Types: Marijuana    Comment: occ   Sexual activity: Yes    Birth control/protection: Surgical  Other Topics Concern   Not on file  Social History Narrative   Patient lives at home with mother and father , one story    Patient has 3 children    Patient is single   Patient has 14 years of education    Patient is right handed    Caffeine none   Social Drivers of Corporate investment banker Strain: Not on file  Food Insecurity: No Food Insecurity (12/23/2023)   Hunger Vital Sign    Worried About Running Out of Food in the Last Year: Never true    Ran Out of Food in the Last Year: Never true  Transportation Needs: No Transportation Needs (12/23/2023)   PRAPARE - Administrator, Civil Service (Medical): No    Lack of Transportation (Non-Medical): No  Physical Activity: Not on file  Stress: Not  on file  Social Connections: Unknown (12/23/2023)   Social Connection and Isolation Panel [NHANES]    Frequency of Communication with Friends and Family: More than three times a week    Frequency of Social Gatherings with Friends and Family: More than three times a week    Attends Religious Services: More than 4 times per year    Active Member of Clubs or Organizations: Yes    Attends Banker Meetings: More than 4 times per year    Marital Status: Patient declined  Intimate Partner Violence: Not At Risk (12/23/2023)   Humiliation, Afraid, Rape, and Kick questionnaire    Fear of Current or Ex-Partner: No    Emotionally Abused: No    Physically Abused: No    Sexually Abused: No    Lab Results  Component Value Date   HGBA1C 9.8 (H) 02/18/2024   HGBA1C 10.3 (H) 12/23/2023   HGBA1C 9.9 (H) 11/17/2023   Lab Results  Component Value Date   CHOL 169 03/31/2024   Lab Results  Component Value Date   HDL 40 (L) 03/31/2024  Lab Results  Component Value Date   LDLCALC 105 (H) 03/31/2024   Lab Results  Component Value Date   TRIG 142 03/31/2024   Lab Results  Component Value Date   CHOLHDL 4.2 03/31/2024   Lab Results  Component Value Date   CREATININE 0.88 03/23/2024   Lab Results  Component Value Date   GFR 78.96 10/09/2022   Lab Results  Component Value Date   MICROALBUR 0.6 11/17/2023      Component Value Date/Time   NA 138 03/23/2024 0934   K 4.9 03/23/2024 0934   CL 99 03/23/2024 0934   CO2 25 03/23/2024 0934   GLUCOSE 328 (H) 03/23/2024 0934   GLUCOSE 74 12/24/2023 0405   BUN 10 03/23/2024 0934   CREATININE 0.88 03/23/2024 0934   CREATININE 0.90 11/17/2023 1047   CALCIUM  9.6 03/23/2024 0934   PROT 6.9 03/23/2024 0934   ALBUMIN 4.3 03/23/2024 0934   AST 26 03/23/2024 0934   ALT 21 03/23/2024 0934   ALKPHOS 170 (H) 03/23/2024 0934   BILITOT 0.3 03/23/2024 0934   GFRNONAA >60 12/24/2023 0405   GFRAA >60 08/10/2020 2100      Latest Ref  Rng & Units 03/23/2024    9:34 AM 12/24/2023    4:05 AM 12/23/2023    5:48 AM  BMP  Glucose 70 - 99 mg/dL 409  74  811   BUN 6 - 24 mg/dL 10  19  12    Creatinine 0.57 - 1.00 mg/dL 9.14  7.82  9.56   BUN/Creat Ratio 9 - 23 11     Sodium 134 - 144 mmol/L 138  141  131   Potassium 3.5 - 5.2 mmol/L 4.9  3.8  3.9   Chloride 96 - 106 mmol/L 99  108  101   CO2 20 - 29 mmol/L 25  27  19    Calcium  8.7 - 10.2 mg/dL 9.6  9.2  8.4        Component Value Date/Time   WBC 7.3 12/24/2023 0405   RBC 4.27 12/24/2023 0405   HGB 12.1 12/24/2023 0405   HGB 13.3 12/12/2022 1036   HCT 36.8 12/24/2023 0405   HCT 40.4 12/12/2022 1036   PLT 408 (H) 12/24/2023 0405   PLT 436 12/12/2022 1036   MCV 86.2 12/24/2023 0405   MCV 86 12/12/2022 1036   MCH 28.3 12/24/2023 0405   MCHC 32.9 12/24/2023 0405   RDW 14.3 12/24/2023 0405   RDW 12.9 12/12/2022 1036   LYMPHSABS 1.1 06/20/2023 1122   LYMPHSABS 2.4 07/14/2019 1058   MONOABS 0.6 06/20/2023 1122   EOSABS 0.0 06/20/2023 1122   EOSABS 0.1 07/14/2019 1058   BASOSABS 0.0 06/20/2023 1122   BASOSABS 0.0 07/14/2019 1058     Parts of this note may have been dictated using voice recognition software. There may be variances in spelling and vocabulary which are unintentional. Not all errors are proofread. Please notify the [facility] if any discrepancies are noted or if the meaning of any statement is not clear.

## 2024-04-23 NOTE — Patient Instructions (Signed)
 Will recommend the following: U500 80 units before break fast, 45 units before lunch and 50 units before supper, 30 min before meals Decrease by 5 units if BG is <70

## 2024-04-27 ENCOUNTER — Telehealth: Admitting: Family Medicine

## 2024-04-27 DIAGNOSIS — R3989 Other symptoms and signs involving the genitourinary system: Secondary | ICD-10-CM

## 2024-04-27 MED ORDER — CEPHALEXIN 500 MG PO CAPS: 500.0000 mg | ORAL_CAPSULE | Freq: Two times a day (BID) | ORAL | 0 refills | Status: DC

## 2024-04-27 NOTE — Progress Notes (Signed)
 Virtual Visit Consent   Whitney Benton, you are scheduled for a virtual visit with a [facility] provider today. Just as with appointments in the office, your consent must be obtained to participate. Your consent will be active for this visit and any virtual visit you may have with one of our providers in the next 365 days. If you have a MyChart account, a copy of this consent can be sent to you electronically.  As this is a virtual visit, video technology does not allow for your provider to perform a traditional examination. This may limit your provider's ability to fully assess your condition. If your provider identifies any concerns that need to be evaluated in person or the need to arrange testing (such as labs, EKG, etc.), we will make arrangements to do so. Although advances in technology are sophisticated, we cannot ensure that it will always work on either your end or our end. If the connection with a video visit is poor, the visit may have to be switched to a telephone visit. With either a video or telephone visit, we are not always able to ensure that we have a secure connection.  By engaging in this virtual visit, you consent to the provision of healthcare and authorize for your insurance to be billed (if applicable) for the services provided during this visit. Depending on your insurance coverage, you may receive a charge related to this service.  I need to obtain your verbal consent now. Are you willing to proceed with your visit today? [name] has provided verbal consent on 04/27/2024 for a virtual visit (video or telephone). Lanetta Pion, NP  Date: 04/27/2024 11:41 AM   Virtual Visit via Video Note   I, Lanetta Pion, connected with  Whitney Benton  ([id number], [birth year]) on 04/27/24 at 11:45 AM EDT by a video-enabled telemedicine application and verified that I am speaking with the correct person using two identifiers.  Location: Patient: Virtual Visit Location Patient:  Home Provider: Virtual Visit Location Provider: Home Office   I discussed the limitations of evaluation and management by telemedicine and the availability of in person appointments. The patient expressed understanding and agreed to proceed.    History of Present Illness: Whitney Benton is a 53 y.o. who identifies as a female who was assigned female at birth, and is being seen today for UTI  Onset was Sunday- 2 days ago- with burning with urination Associated symptoms are increased frequency, nausea at times,  Modifying factors are Denies kidney infection, fevers, chills, blood in urine, dark urine, pelvic pain, back pain.  History of stones in 2015  Thinks from change in hygiene products    Problems:  Patient Active Problem List   Diagnosis Date Noted   Intractable nausea and vomiting 12/23/2023   Diabetic gastroparesis (HCC) 12/22/2023   DKA, type 1 (HCC) 06/20/2023   Essential hypertension 06/20/2023   Vitamin D  deficiency 06/20/2023   Hyperlipidemia 03/06/2023   Primary insomnia 12/27/2022   Gallstone pancreatitis 12/03/2022   Chronic cholecystitis with calculus 12/03/2022   Morbid obesity (HCC) 04/04/2022   Former smoker 04/30/2021   Major depressive disorder, recurrent episode, moderate (HCC) 04/30/2021   Substance induced mood disorder (HCC) 03/22/2021   Generalized anxiety disorder 03/22/2021   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) 10/02/2019   AKI (acute kidney injury) (HCC) 10/02/2019  Hyperkalemia 10/02/2019   Leukocytosis 10/02/2019   Abnormal LFTs 10/02/2019   Stressful life events affecting family and household 08/06/2019   New onset type 2 diabetes mellitus (HCC) 02/04/2019   Morbid obesity with BMI of 40.0-44.9, adult (HCC) 02/04/2019   Tobacco dependence 02/04/2019   Left arm weakness 12/06/2014   Paresthesias/numbness 12/06/2014    Tobacco abuse 11/14/2014   Depression    Mixed incontinence 06/27/2014   History of TVH in 2006 for fibroids and menorrhagia; benign pathology 09/08/2011    Allergies:  Allergies  Allergen Reactions   Aspirin Hives   Trulicity  [Dulaglutide ] Other (See Comments)    DC'd due to chronic gastric and abdominal pain with nausea   Oxycodone  Nausea And Vomiting   Medications:  Current Outpatient Medications:    Accu-Chek Softclix Lancets lancets, Use to check blood sugar 3 times daily, Disp: 100 each, Rfl: 6   albuterol  (VENTOLIN  HFA) 108 (90 Base) MCG/ACT inhaler, Inhale 2 puffs into the lungs every 6 (six) hours as needed for wheezing or shortness of breath (Cough)., Disp: 18 g, Rfl: 0   amLODipine  (NORVASC ) 10 MG tablet, Take 1 tablet (10 mg total) by mouth daily., Disp: 90 tablet, Rfl: 2   Blood Glucose Monitoring Suppl (ACCU-CHEK GUIDE) w/Device KIT, Use to check blood sugar 3 times daily., Disp: 1 kit, Rfl: 0   Blood Pressure Monitoring (BLOOD PRESSURE CUFF) MISC, 1 each by Does not apply route daily. Please provide Large cuff, Disp: 1 each, Rfl: 0   busPIRone  (BUSPAR ) 15 MG tablet, Take 1 tablet (15 mg total) by mouth 3 (three) times daily., Disp: 90 tablet, Rfl: 3   Continuous Glucose Sensor (DEXCOM G7 SENSOR) MISC, Change sensor every 10 days., Disp: 9 each, Rfl: 3   cyclobenzaprine  (FLEXERIL ) 10 MG tablet, Take 1 tablet (10 mg total) by mouth 3 (three) times daily as needed for muscle spasms., Disp: 30 tablet, Rfl: 0   cycloSPORINE  (RESTASIS ) 0.05 % ophthalmic emulsion, Apply 1 drop into both eyes twice a day, Disp: 180 each, Rfl: 3   DULoxetine  (CYMBALTA ) 20 MG capsule, Take 1 capsule (20 mg total) by mouth every morning., Disp: 30 capsule, Rfl: 3   DULoxetine  (CYMBALTA ) 60 MG capsule, Take 1 capsule (60 mg total) by mouth daily., Disp: 30 capsule, Rfl: 3   Erenumab -aooe (AIMOVIG ) 140 MG/ML SOAJ, Inject 140 mg into the skin every 28 (twenty-eight) days., Disp: 1.12 mL, Rfl: 5    furosemide  (LASIX ) 20 MG tablet, Take 1 tablet (20 mg total) by mouth daily as needed (weight gain)., Disp: 30 tablet, Rfl: 0   gabapentin  (NEURONTIN ) 300 MG capsule, Take 1 capsule (300 mg total) by mouth 3 (three) times daily., Disp: 90 capsule, Rfl: 3   Glucagon  (BAQSIMI  ONE PACK) 3 MG/DOSE POWD, Place 1 Device into the nose as needed (Low blood sugar with impaired consciousness)., Disp: 2 each, Rfl: 3   glucose blood (ACCU-CHEK GUIDE) test strip, Use to check blood sugar 3 times daily, Disp: 100 each, Rfl: 6   hydrOXYzine  (ATARAX ) 50 MG tablet, Take 1 tablet (50 mg total) by mouth 3 (three) times daily., Disp: 90 tablet, Rfl: 3   ibuprofen  (ADVIL ) 800 MG tablet, Take 1 tablet (800 mg total) by mouth 3 (three) times daily., Disp: 42 tablet, Rfl: 0   Insulin  Pen Needle (PEN NEEDLES 3/16") 31G X 5 MM MISC, Use as directed with insulin  pen, Disp: 100 each, Rfl: 2   insulin  regular human CONCENTRATED (HUMULIN  R U-500 KWIKPEN) 500 UNIT/ML KwikPen, Inject  70 Units into the skin daily with breakfast AND 50 Units daily before lunch AND 60 Units daily before supper. Take 30 minutes before meals., Disp: 12 mL, Rfl: 3   metoCLOPramide  (REGLAN ) 10 MG tablet, Take 1 tablet (10 mg total) by mouth 3 (three) times daily before meals. (Patient taking differently: Take 10 mg by mouth in the morning.), Disp: 90 tablet, Rfl: 0   metoprolol  tartrate (LOPRESSOR ) 25 MG tablet, Take 1 tablet (25 mg total) by mouth 2 (two) times daily., Disp: 60 tablet, Rfl: 5   Multiple Vitamins-Minerals (EYE VITAMINS PO), Take 1 tablet by mouth daily., Disp: , Rfl:    OLANZapine -Samidorphan (LYBALVI ) 15-10 MG TABS, Take 15 mg by mouth at bedtime., Disp: 30 tablet, Rfl: 3   omeprazole  (PRILOSEC) 40 MG capsule, TAKE 1 CAPSULE BY MOUTH ONCE DAILY 30 MINUTES BEFORE BREAKFAST, Disp: 90 capsule, Rfl: 2   ondansetron  (ZOFRAN -ODT) 4 MG disintegrating tablet, Take 1 tablet (4 mg total) by mouth every 8 (eight) hours as needed., Disp: 20 tablet,  Rfl: 0   QUEtiapine  (SEROQUEL ) 25 MG tablet, Take 1 tablet (25 mg total) by mouth at bedtime as needed. (Patient taking differently: Take 25 mg by mouth at bedtime as needed. HAS NOT STARTED.  HAS NOT PICKED THIS UP-04/07/2024), Disp: 30 tablet, Rfl: 3   rosuvastatin  (CRESTOR ) 40 MG tablet, Take 1 tablet (40 mg total) by mouth daily., Disp: 90 tablet, Rfl: 3   SUMAtriptan  (IMITREX ) 100 MG tablet, TAKE 1 TABLET EARLIEST ONSET OF MIGRAINE. MAY REPEAT IN 2 HOURS IF HEADACHE PERSISTS OR RECURS. MAXIMUM 2 TABLETS IN 24 HOURS., Disp: 10 tablet, Rfl: 5   traZODone  (DESYREL ) 50 MG tablet, Take 1-2 tablets (50-100 mg total) by mouth at bedtime as needed for sleep., Disp: 60 tablet, Rfl: 3   vitamin B-12 (CYANOCOBALAMIN) 50 MCG tablet, Take 50 mcg by mouth daily., Disp: , Rfl:    Vitamin D , Cholecalciferol , 10 MCG (400 UNIT) CAPS, Take 400 Int'l Units/day by mouth daily., Disp: 100 capsule, Rfl: 1  Observations/Objective: Patient is well-developed, well-nourished in no acute distress.  Resting comfortably  at home.  Head is normocephalic, atraumatic.  No labored breathing.  Speech is clear and coherent with logical content.  Patient is alert and oriented at baseline.    Assessment and Plan:   1. Suspected UTI (Primary)  - cephALEXin  (KEFLEX ) 500 MG capsule; Take 1 capsule (500 mg total) by mouth 2 (two) times daily for 7 days.  Dispense: 14 capsule; Refill: 0   -no other red flags for stone or kidney infection -increase fluids -strict in person precautions reviewed  -complete medication as discussed -prevention discussed and on AVS   Reviewed side effects, risks and benefits of medication.    Patient acknowledged agreement and understanding of the plan.   Past Medical, Surgical, Social History, Allergies, and Medications have been Reviewed.    Follow Up Instructions: I discussed the assessment and treatment plan with the patient. The patient was provided an opportunity to ask questions  and all were answered. The patient agreed with the plan and demonstrated an understanding of the instructions.  A copy of instructions were sent to the patient via MyChart unless otherwise noted below.    The patient was advised to call back or seek an in-person evaluation if the symptoms worsen or if the condition fails to improve as anticipated.    Lanetta Pion, NP

## 2024-04-27 NOTE — Patient Instructions (Addendum)
 Whitney Benton, thank you for joining Lanetta Pion, NP for today's virtual visit.  While this provider is not your primary care provider (PCP), if your PCP is located in our provider database this encounter information will be shared with them immediately following your visit.   A [facility] MyChart account gives you access to today's visit and all your visits, tests, and labs performed at [facility] " click here if you don't have a [facility] MyChart account or go to [email or web address]  Consent: (Patient) Whitney Benton provided verbal consent for this virtual visit at the beginning of the encounter.  Current Medications:  Current Outpatient Medications:    cephALEXin  (KEFLEX ) 500 MG capsule, Take 1 capsule (500 mg total) by mouth 2 (two) times daily for 7 days., Disp: 14 capsule, Rfl: 0   Accu-Chek Softclix Lancets lancets, Use to check blood sugar 3 times daily, Disp: 100 each, Rfl: 6   albuterol  (VENTOLIN  HFA) 108 (90 Base) MCG/ACT inhaler, Inhale 2 puffs into the lungs every 6 (six) hours as needed for wheezing or shortness of breath (Cough)., Disp: 18 g, Rfl: 0   amLODipine  (NORVASC ) 10 MG tablet, Take 1 tablet (10 mg total) by mouth daily., Disp: 90 tablet, Rfl: 2   Blood Glucose Monitoring Suppl (ACCU-CHEK GUIDE) w/Device KIT, Use to check blood sugar 3 times daily., Disp: 1 kit, Rfl: 0   Blood Pressure Monitoring (BLOOD PRESSURE CUFF) MISC, 1 each by Does not apply route daily. Please provide Large cuff, Disp: 1 each, Rfl: 0   busPIRone  (BUSPAR ) 15 MG tablet, Take 1 tablet (15 mg total) by mouth 3 (three) times daily., Disp: 90 tablet, Rfl: 3   Continuous Glucose Sensor (DEXCOM G7 SENSOR) MISC, Change sensor every 10 days., Disp: 9 each, Rfl: 3   cyclobenzaprine  (FLEXERIL ) 10 MG tablet, Take 1 tablet (10 mg total) by mouth 3 (three) times daily as needed for muscle spasms., Disp: 30 tablet, Rfl: 0   cycloSPORINE  (RESTASIS ) 0.05 % ophthalmic emulsion, Apply 1 drop  into both eyes twice a day, Disp: 180 each, Rfl: 3   DULoxetine  (CYMBALTA ) 20 MG capsule, Take 1 capsule (20 mg total) by mouth every morning., Disp: 30 capsule, Rfl: 3   DULoxetine  (CYMBALTA ) 60 MG capsule, Take 1 capsule (60 mg total) by mouth daily., Disp: 30 capsule, Rfl: 3   Erenumab -aooe (AIMOVIG ) 140 MG/ML SOAJ, Inject 140 mg into the skin every 28 (twenty-eight) days., Disp: 1.12 mL, Rfl: 5   furosemide  (LASIX ) 20 MG tablet, Take 1 tablet (20 mg total) by mouth daily as needed (weight gain)., Disp: 30 tablet, Rfl: 0   gabapentin  (NEURONTIN ) 300 MG capsule, Take 1 capsule (300 mg total) by mouth 3 (three) times daily., Disp: 90 capsule, Rfl: 3   Glucagon  (BAQSIMI  ONE PACK) 3 MG/DOSE POWD, Place 1 Device into the nose as needed (Low blood sugar with impaired consciousness)., Disp: 2 each, Rfl: 3   glucose blood (ACCU-CHEK GUIDE) test strip, Use to check blood sugar 3 times daily, Disp: 100 each, Rfl: 6   hydrOXYzine  (ATARAX ) 50 MG tablet, Take 1 tablet (50 mg total) by mouth 3 (three) times daily., Disp: 90 tablet, Rfl: 3   ibuprofen  (ADVIL ) 800 MG tablet, Take 1 tablet (800 mg total) by mouth 3 (three) times daily., Disp: 42 tablet, Rfl: 0   Insulin  Pen Needle (PEN NEEDLES 3/16") 31G X 5 MM MISC, Use as directed with insulin  pen, Disp: 100 each, Rfl: 2   insulin  regular human CONCENTRATED (  HUMULIN  R U-500 KWIKPEN) 500 UNIT/ML KwikPen, Inject 70 Units into the skin daily with breakfast AND 50 Units daily before lunch AND 60 Units daily before supper. Take 30 minutes before meals., Disp: 12 mL, Rfl: 3   metoCLOPramide  (REGLAN ) 10 MG tablet, Take 1 tablet (10 mg total) by mouth 3 (three) times daily before meals. (Patient taking differently: Take 10 mg by mouth in the morning.), Disp: 90 tablet, Rfl: 0   metoprolol  tartrate (LOPRESSOR ) 25 MG tablet, Take 1 tablet (25 mg total) by mouth 2 (two) times daily., Disp: 60 tablet, Rfl: 5   Multiple Vitamins-Minerals (EYE VITAMINS PO), Take 1 tablet by  mouth daily., Disp: , Rfl:    OLANZapine -Samidorphan (LYBALVI ) 15-10 MG TABS, Take 15 mg by mouth at bedtime., Disp: 30 tablet, Rfl: 3   omeprazole  (PRILOSEC) 40 MG capsule, TAKE 1 CAPSULE BY MOUTH ONCE DAILY 30 MINUTES BEFORE BREAKFAST, Disp: 90 capsule, Rfl: 2   ondansetron  (ZOFRAN -ODT) 4 MG disintegrating tablet, Take 1 tablet (4 mg total) by mouth every 8 (eight) hours as needed., Disp: 20 tablet, Rfl: 0   QUEtiapine  (SEROQUEL ) 25 MG tablet, Take 1 tablet (25 mg total) by mouth at bedtime as needed. (Patient taking differently: Take 25 mg by mouth at bedtime as needed. HAS NOT STARTED.  HAS NOT PICKED THIS UP-04/07/2024), Disp: 30 tablet, Rfl: 3   rosuvastatin  (CRESTOR ) 40 MG tablet, Take 1 tablet (40 mg total) by mouth daily., Disp: 90 tablet, Rfl: 3   SUMAtriptan  (IMITREX ) 100 MG tablet, TAKE 1 TABLET EARLIEST ONSET OF MIGRAINE. MAY REPEAT IN 2 HOURS IF HEADACHE PERSISTS OR RECURS. MAXIMUM 2 TABLETS IN 24 HOURS., Disp: 10 tablet, Rfl: 5   traZODone  (DESYREL ) 50 MG tablet, Take 1-2 tablets (50-100 mg total) by mouth at bedtime as needed for sleep., Disp: 60 tablet, Rfl: 3   vitamin B-12 (CYANOCOBALAMIN) 50 MCG tablet, Take 50 mcg by mouth daily., Disp: , Rfl:    Vitamin D , Cholecalciferol , 10 MCG (400 UNIT) CAPS, Take 400 Int'l Units/day by mouth daily., Disp: 100 capsule, Rfl: 1   Medications ordered in this encounter:  Meds ordered this encounter  Medications   cephALEXin  (KEFLEX ) 500 MG capsule    Sig: Take 1 capsule (500 mg total) by mouth 2 (two) times daily for 7 days.    Dispense:  14 capsule    Refill:  0    Supervising Provider:   Corine Dice [[id number]]     *If you need refills on other medications prior to your next appointment, please contact your pharmacy*  Follow-Up: Call back or seek an in-person evaluation if the symptoms worsen or if the condition fails to improve as anticipated.  [facility] [phone]  Other Instructions Urinary Tract  Infection, Female A urinary tract infection (UTI) is an infection in your urinary tract. The urinary tract is made up of organs that make, store, and get rid of pee (urine) in your body. These organs include: The kidneys. The ureters. The bladder. The urethra. What are the causes? Most UTIs are caused by germs called bacteria. They may be in or near your genitals. These germs grow and cause swelling in your urinary tract. What increases the risk? You're more likely to get a UTI if: You're a female. The urethra is shorter in females than in males. You have a soft tube called a catheter that drains your pee. You can't control when you pee or poop. You have trouble peeing because of: A kidney  stone. A urinary blockage. A nerve condition that affects your bladder. Not getting enough to drink. You're sexually active. You use a birth control inside your vagina, like spermicide. You're pregnant. You have low levels of the hormone estrogen in your body. You're an older adult. You're also more likely to get a UTI if you have other health problems. These may include: Diabetes. A weak immune system. Your immune system is your body's defense system. Sickle cell disease. Injury of the spine. What are the signs or symptoms? Symptoms may include: Needing to pee right away. Peeing small amounts often. Pain or burning when you pee. Blood in your pee. Pee that smells bad or odd. Pain in your belly or lower back. You may also: Feel confused. This may be the first symptom in older adults. Vomit. Not feel hungry. Feel tired or easily annoyed. Have a fever or chills. How is this diagnosed? A UTI is diagnosed based on your medical history and an exam. You may also have other tests. These may include: Pee tests. Blood tests. Tests for sexually transmitted infections (STIs). If you've had more than one UTI, you may need to have imaging studies done to find out why you keep getting them. How is  this treated? A UTI can be treated by: Taking antibiotics or other medicines. Drinking enough fluid to keep your pee pale yellow. In rare cases, a UTI can cause a very bad condition called sepsis. Sepsis may be treated in the hospital. Follow these instructions at home: Medicines Take your medicines only as told by your health care provider. If you were given antibiotics, take them as told by your provider. Do not stop taking them even if you start to feel better. General instructions Make sure you: Pee often and fully. Do not hold your pee for a long time. Wipe from front to back after you pee or poop. Use each tissue only once when you wipe. Pee after you have sex. Do not douche or use sprays or powders in your genital area. Contact a health care provider if: Your symptoms don't get better after 1-2 days of taking antibiotics. Your symptoms go away and then come back. You have a fever or chills. You vomit or feel like you may vomit. Get help right away if: You have very bad pain in your back or lower belly. You faint. This information is not intended to replace advice given to you by your health care provider. Make sure you discuss any questions you have with your health care provider. Document Revised: 07/17/2023 Document Reviewed: 03/14/2023 Elsevier Patient Education  2024 Elsevier Inc.    If you have been instructed to have an in-person evaluation today at a local Urgent Care facility, please use the link below. It will take you to a list of all of our available [facility], including address, phone number and hours of operation. Please do not delay care.  [facility]  If you or a family member do not have a primary care provider, use the link below to schedule a visit and establish care. When you choose a [facility] primary care physician or advanced practice provider, you gain a long-term partner in health. Find a Primary Care Provider  Learn  more about [facility]'s in-office and virtual care options: [facility] - Get Care Now

## 2024-04-28 ENCOUNTER — Encounter (HOSPITAL_COMMUNITY): Payer: Self-pay

## 2024-04-28 ENCOUNTER — Emergency Department (HOSPITAL_COMMUNITY)
Admission: EM | Admit: 2024-04-28 | Discharge: 2024-04-28 | Disposition: A | Discharge status: Home / Self Care | Attending: Emergency Medicine | Admitting: Emergency Medicine

## 2024-04-28 ENCOUNTER — Other Ambulatory Visit: Payer: Self-pay

## 2024-04-28 DIAGNOSIS — Z9049 Acquired absence of other specified parts of digestive tract: Secondary | ICD-10-CM | POA: Diagnosis not present

## 2024-04-28 DIAGNOSIS — I10 Essential (primary) hypertension: Secondary | ICD-10-CM | POA: Insufficient documentation

## 2024-04-28 DIAGNOSIS — Z79899 Other long term (current) drug therapy: Secondary | ICD-10-CM | POA: Diagnosis not present

## 2024-04-28 DIAGNOSIS — R112 Nausea with vomiting, unspecified: Secondary | ICD-10-CM | POA: Diagnosis present

## 2024-04-28 DIAGNOSIS — E1065 Type 1 diabetes mellitus with hyperglycemia: Secondary | ICD-10-CM | POA: Insufficient documentation

## 2024-04-28 DIAGNOSIS — E860 Dehydration: Secondary | ICD-10-CM | POA: Insufficient documentation

## 2024-04-28 DIAGNOSIS — N390 Urinary tract infection, site not specified: Secondary | ICD-10-CM | POA: Insufficient documentation

## 2024-04-28 DIAGNOSIS — K3184 Gastroparesis: Secondary | ICD-10-CM | POA: Diagnosis not present

## 2024-04-28 DIAGNOSIS — R030 Elevated blood-pressure reading, without diagnosis of hypertension: Secondary | ICD-10-CM

## 2024-04-28 DIAGNOSIS — K30 Functional dyspepsia: Secondary | ICD-10-CM

## 2024-04-28 LAB — URINALYSIS, ROUTINE W REFLEX MICROSCOPIC
Bilirubin Urine: NEGATIVE
Glucose, UA: NEGATIVE mg/dL
Ketones, ur: NEGATIVE mg/dL
Nitrite: NEGATIVE
Protein, ur: 100 mg/dL — AB
Specific Gravity, Urine: 1.018 (ref 1.005–1.030)
WBC, UA: >50 WBC/hpf (ref 0–5)
pH: 5 (ref 5.0–8.0)

## 2024-04-28 LAB — COMPREHENSIVE METABOLIC PANEL WITH GFR
ALT: 24 U/L (ref 0–44)
AST: 34 U/L (ref 15–41)
Albumin: 4.4 g/dL (ref 3.5–5.0)
Alkaline Phosphatase: 126 U/L (ref 38–126)
Anion gap: 12 (ref 5–15)
BUN: 16 mg/dL (ref 6–20)
CO2: 26 mmol/L (ref 22–32)
Calcium: 9.6 mg/dL (ref 8.9–10.3)
Chloride: 97 mmol/L — ABNORMAL LOW (ref 98–111)
Creatinine, Ser: 1.09 mg/dL — ABNORMAL HIGH (ref 0.44–1.00)
GFR, Estimated: >60 mL/min (ref >60)
Glucose, Bld: 216 mg/dL — ABNORMAL HIGH (ref 70–99)
Potassium: 3.5 mmol/L (ref 3.5–5.1)
Sodium: 135 mmol/L (ref 135–145)
Total Bilirubin: 0.9 mg/dL (ref 0.0–1.2)
Total Protein: 9 g/dL — ABNORMAL HIGH (ref 6.5–8.1)

## 2024-04-28 LAB — CBG MONITORING, ED: Glucose-Capillary: 209 mg/dL — ABNORMAL HIGH (ref 70–99)

## 2024-04-28 LAB — CBC
HCT: 45.9 % (ref 36.0–46.0)
Hemoglobin: 14.3 g/dL (ref 12.0–15.0)
MCH: 26.2 pg (ref 26.0–34.0)
MCHC: 31.2 g/dL (ref 30.0–36.0)
MCV: 84.2 fL (ref 80.0–100.0)
Platelets: 487 10*3/uL — ABNORMAL HIGH (ref 150–400)
RBC: 5.45 MIL/uL — ABNORMAL HIGH (ref 3.87–5.11)
RDW: 15.1 % (ref 11.5–15.5)
WBC: 12.4 10*3/uL — ABNORMAL HIGH (ref 4.0–10.5)
nRBC: 0 % (ref 0.0–0.2)

## 2024-04-28 LAB — LIPASE, BLOOD: Lipase: 25 U/L (ref 11–51)

## 2024-04-28 LAB — RAPID URINE DRUG SCREEN, HOSP PERFORMED
Amphetamines: NOT DETECTED
Barbiturates: NOT DETECTED
Benzodiazepines: NOT DETECTED
Cocaine: NOT DETECTED
Opiates: POSITIVE — AB
Tetrahydrocannabinol: POSITIVE — AB

## 2024-04-28 MED ORDER — MORPHINE SULFATE (PF) 4 MG/ML IV SOLN
4.0000 mg | Freq: Once | INTRAVENOUS | Status: AC
  Administered 2024-04-28: 4 mg via INTRAVENOUS
  Filled 2024-04-28: qty 1

## 2024-04-28 MED ORDER — LACTATED RINGERS IV BOLUS
1000.0000 mL | Freq: Once | INTRAVENOUS | Status: AC
Start: 2024-04-28 — End: 2024-04-28
  Administered 2024-04-28: 1000 mL via INTRAVENOUS

## 2024-04-28 MED ORDER — ONDANSETRON HCL 4 MG/2ML IJ SOLN
4.0000 mg | Freq: Once | INTRAMUSCULAR | Status: AC
  Administered 2024-04-28: 4 mg via INTRAVENOUS
  Filled 2024-04-28: qty 2

## 2024-04-28 MED ORDER — PANTOPRAZOLE SODIUM 40 MG IV SOLR
40.0000 mg | Freq: Once | INTRAVENOUS | Status: AC
  Administered 2024-04-28: 40 mg via INTRAVENOUS
  Filled 2024-04-28: qty 10

## 2024-04-28 MED ORDER — LACTATED RINGERS IV BOLUS
1000.0000 mL | Freq: Once | INTRAVENOUS | Status: AC
  Administered 2024-04-28: 1000 mL via INTRAVENOUS

## 2024-04-28 MED ORDER — SODIUM CHLORIDE 0.9 % IV SOLN
1.0000 g | Freq: Once | INTRAVENOUS | Status: AC
  Administered 2024-04-28: 1 g via INTRAVENOUS
  Filled 2024-04-28: qty 10

## 2024-04-28 MED ORDER — METOCLOPRAMIDE HCL 5 MG/ML IJ SOLN
10.0000 mg | Freq: Once | INTRAMUSCULAR | Status: AC
  Administered 2024-04-28: 10 mg via INTRAVENOUS
  Filled 2024-04-28: qty 2

## 2024-04-28 NOTE — ED Triage Notes (Signed)
 Patient has been vomiting since Saturday about 10 times a day. Has type 1 diabetes and has been taking her insulin . Has burning urination. No diarrhea.

## 2024-04-28 NOTE — Discharge Instructions (Addendum)
 It was our pleasure to provide your ER care today - we hope that you feel better.  Drink plenty of fluids/stay well hydrated.  Take your reglan  as prescribed.  Your urine test shows a possible urine infection - take keflex  (antibiotic) as prescribed for urine infection.   Follow up closely with primary care doctor in the next 1-2 days if symptoms fail to improve/resolve.  Note that increasingly we are seeing a recurrent abdominal pain and/or vomiting syndrome called Cannabinoid Hyperemesis Syndrome - see attached info - in these cases, avoiding marijuana use will prevent symptoms from recurring (note that symptoms can persist for a few weeks if history of heavy marijuana use as it can take time to get out of system).   Return to ER right away  if worse, fevers, new symptoms, new or worsening or severe abdominal pain, persistent vomiting, chest pain, trouble breathing, or other emergency concern.  You were given pain meds in the ER - no driving for the next 6 hours.

## 2024-04-28 NOTE — ED Provider Notes (Signed)
 [facility] AT [facility] Provider Note   CSN: [id number] Arrival date & time: 04/28/24  4098     History  Chief Complaint  Patient presents with   Emesis    Whitney Benton is a 53 y.o. female.  Pt with hx diabetes presents with upper abdominal pain and nausea/vomiting for the past few days.  Emesis not bloody or bilious. Is having normal bms. No fever or chills. No abd distension. No dysuria or gu c/o. Indicates compliant w insulin , and has adequate supply of meds at home. Remote hx cholecystectomy. No hx pud or pancreatitis. No back/flank pain. No chest pain or discomfort. No sob or unusual doe.   The history is provided by the patient, medical records and a relative.  Emesis Associated symptoms: no chills, no cough, no diarrhea, no fever, no headaches and no sore throat        Home Medications Prior to Admission medications   Medication Sig Start Date End Date Taking? Authorizing Provider  Accu-Chek Softclix Lancets lancets Use to check blood sugar 3 times daily 08/11/23   Lawrance Presume, MD  albuterol  (VENTOLIN  HFA) 108 (90 Base) MCG/ACT inhaler Inhale 2 puffs into the lungs every 6 (six) hours as needed for wheezing or shortness of breath (Cough). 02/26/23   Lawrance Presume, MD  amLODipine  (NORVASC ) 10 MG tablet Take 1 tablet (10 mg total) by mouth daily. 05/07/23   Hassie Lint, PA-C  Blood Glucose Monitoring Suppl (ACCU-CHEK GUIDE) w/Device KIT Use to check blood sugar 3 times daily. 08/11/23   Lawrance Presume, MD  Blood Pressure Monitoring (BLOOD PRESSURE CUFF) MISC 1 each by Does not apply route daily. Please provide Large cuff 03/23/24   Gerald Kitty., NP  busPIRone  (BUSPAR ) 15 MG tablet Take 1 tablet (15 mg total) by mouth 3 (three) times daily. 03/30/24   Arlyne Bering, NP  cephALEXin  (KEFLEX ) 500 MG capsule Take 1 capsule (500 mg total) by mouth 2 (two) times daily for 7 days. 04/27/24 05/04/24  Lanetta Pion, NP   Continuous Glucose Sensor (DEXCOM G7 SENSOR) MISC Change sensor every 10 days. 03/03/24   Motwani, Komal, MD  cyclobenzaprine  (FLEXERIL ) 10 MG tablet Take 1 tablet (10 mg total) by mouth 3 (three) times daily as needed for muscle spasms. 04/07/24   Currieo, Andrew D, MD  cycloSPORINE  (RESTASIS ) 0.05 % ophthalmic emulsion Apply 1 drop into both eyes twice a day 09/19/21     DULoxetine  (CYMBALTA ) 20 MG capsule Take 1 capsule (20 mg total) by mouth every morning. 03/30/24   Arlyne Bering, NP  DULoxetine  (CYMBALTA ) 60 MG capsule Take 1 capsule (60 mg total) by mouth daily. 03/30/24   Arlyne Bering, NP  Erenumab -aooe (AIMOVIG ) 140 MG/ML SOAJ Inject 140 mg into the skin every 28 (twenty-eight) days. 03/11/24   Merriam Abbey, DO  furosemide  (LASIX ) 20 MG tablet Take 1 tablet (20 mg total) by mouth daily as needed (weight gain). 03/23/24   Gerald Kitty., NP  gabapentin  (NEURONTIN ) 300 MG capsule Take 1 capsule (300 mg total) by mouth 3 (three) times daily. 03/30/24   Arlyne Bering, NP  Glucagon  (BAQSIMI  ONE PACK) 3 MG/DOSE POWD Place 1 Device into the nose as needed (Low blood sugar with impaired consciousness). 11/24/23   Motwani, Komal, MD  glucose blood (ACCU-CHEK GUIDE) test strip Use to check blood sugar 3 times daily 08/11/23   Lawrance Presume, MD  hydrOXYzine  (ATARAX )  50 MG tablet Take 1 tablet (50 mg total) by mouth 3 (three) times daily. 03/30/24   Arlyne Bering, NP  ibuprofen  (ADVIL ) 800 MG tablet Take 1 tablet (800 mg total) by mouth 3 (three) times daily. 04/07/24   Currieo, Andrew D, MD  Insulin  Pen Needle (PEN NEEDLES 3/16") 31G X 5 MM MISC Use as directed with insulin  pen 05/07/23   McClung, Angela M, PA-C  insulin  regular human CONCENTRATED (HUMULIN  R U-500 KWIKPEN) 500 UNIT/ML KwikPen Inject 70 Units into the skin daily with breakfast AND 50 Units daily before lunch AND 60 Units daily before supper. Take 30 minutes before meals. 02/16/24   Lawrance Presume, MD  metoCLOPramide   (REGLAN ) 10 MG tablet Take 1 tablet (10 mg total) by mouth 3 (three) times daily before meals. Patient taking differently: Take 10 mg by mouth in the morning. 11/14/23   Newlin, Enobong, MD  metoprolol  tartrate (LOPRESSOR ) 25 MG tablet Take 1 tablet (25 mg total) by mouth 2 (two) times daily. 03/23/24   Gerald Kitty., NP  Multiple Vitamins-Minerals (EYE VITAMINS PO) Take 1 tablet by mouth daily.    [provider]  OLANZapine -Samidorphan (LYBALVI ) 15-10 MG TABS Take 15 mg by mouth at bedtime. 03/30/24   Arlyne Bering, NP  omeprazole  (PRILOSEC) 40 MG capsule TAKE 1 CAPSULE BY MOUTH ONCE DAILY 30 MINUTES BEFORE BREAKFAST 03/10/23   Arlee Bellows, NP  ondansetron  (ZOFRAN -ODT) 4 MG disintegrating tablet Take 1 tablet (4 mg total) by mouth every 8 (eight) hours as needed. 11/12/23   Lucina Sabal, PA-C  QUEtiapine  (SEROQUEL ) 25 MG tablet Take 1 tablet (25 mg total) by mouth at bedtime as needed. Patient taking differently: Take 25 mg by mouth at bedtime as needed. HAS NOT STARTED.  HAS NOT PICKED THIS UP-04/07/2024 03/30/24   Arlyne Bering, NP  rosuvastatin  (CRESTOR ) 40 MG tablet Take 1 tablet (40 mg total) by mouth daily. 04/06/24   Motwani, Komal, MD  SUMAtriptan  (IMITREX ) 100 MG tablet TAKE 1 TABLET EARLIEST ONSET OF MIGRAINE. MAY REPEAT IN 2 HOURS IF HEADACHE PERSISTS OR RECURS. MAXIMUM 2 TABLETS IN 24 HOURS. 03/11/24 03/11/25  Merriam Abbey, DO  traZODone  (DESYREL ) 50 MG tablet Take 1-2 tablets (50-100 mg total) by mouth at bedtime as needed for sleep. 03/30/24   Arlyne Bering, NP  vitamin B-12 (CYANOCOBALAMIN) 50 MCG tablet Take 50 mcg by mouth daily.    [provider]  Vitamin D , Cholecalciferol , 10 MCG (400 UNIT) CAPS Take 400 Int'l Units/day by mouth daily. 09/10/23   Lawrance Presume, MD  cetirizine  (ZYRTEC ) 10 MG tablet Take 1 tablet (10 mg total) by mouth daily. Patient not taking: No sig reported 03/31/19 03/03/20  Newlin, Enobong, MD      Allergies    Aspirin,  Trulicity  [dulaglutide ], and Oxycodone     Review of Systems   Review of Systems  Constitutional:  Negative for chills and fever.  HENT:  Negative for sore throat.   Eyes:  Negative for visual disturbance.  Respiratory:  Negative for cough and shortness of breath.   Cardiovascular:  Negative for chest pain and leg swelling.  Gastrointestinal:  Positive for nausea and vomiting. Negative for constipation and diarrhea.  Endocrine: Negative for polydipsia and polyuria.  Genitourinary:  Negative for dysuria, flank pain, vaginal bleeding and vaginal discharge.  Musculoskeletal:  Negative for back pain and neck pain.  Skin:  Negative for rash.  Neurological:  Negative for headaches.    Physical Exam  Updated Vital Signs BP (!) 153/99 (BP Location: Right Arm)   Pulse (!) 101   Temp 98.1 F (36.7 C) (Oral)   Resp 18   Ht 1.6 m (5\' 3" )   Wt 104.3 kg   SpO2 97%   BMI 40.74 kg/m  Physical Exam Vitals and nursing note reviewed.  Constitutional:      Appearance: Normal appearance. She is well-developed.  HENT:     Head: Atraumatic.     Nose: Nose normal.     Mouth/Throat:     Mouth: Mucous membranes are moist.     Pharynx: Oropharynx is clear. No oropharyngeal exudate or posterior oropharyngeal erythema.  Eyes:     General: No scleral icterus.    Conjunctiva/sclera: Conjunctivae normal.     Pupils: Pupils are equal, round, and reactive to light.  Neck:     Trachea: No tracheal deviation.     Comments: Trachea midline. Thyroid not grossly enlarged or tender. No neck stiffness or rigidity.  Cardiovascular:     Rate and Rhythm: Regular rhythm. Tachycardia present.     Pulses: Normal pulses.     Heart sounds: Normal heart sounds. No murmur heard.    No friction rub. No gallop.  Pulmonary:     Effort: Pulmonary effort is normal. No respiratory distress.     Breath sounds: Normal breath sounds.  Abdominal:     General: Bowel sounds are normal. There is no distension.     Palpations:  Abdomen is soft. There is no mass.     Tenderness: There is no abdominal tenderness. There is no guarding or rebound.     Hernia: No hernia is present.  Genitourinary:    Comments: No cva tenderness.  Musculoskeletal:        General: No swelling or tenderness.     Cervical back: Normal range of motion and neck supple. No rigidity. No muscular tenderness.     Right lower leg: No edema.     Left lower leg: No edema.  Skin:    General: Skin is warm and dry.     Findings: No rash.  Neurological:     Mental Status: She is alert.     Comments: Alert, speech normal.   Psychiatric:        Mood and Affect: Mood normal.     ED Results / Procedures / Treatments   Labs (all labs ordered are listed, but only abnormal results are displayed) Results for orders placed or performed during the hospital encounter of 04/28/24  CBG monitoring, ED   Collection Time: 04/28/24  8:14 AM  Result Value Ref Range   Glucose-Capillary 209 (H) 70 - 99 mg/dL   Comment 1 Notify RN   CBC   Collection Time: 04/28/24  8:28 AM  Result Value Ref Range   WBC 12.4 (H) 4.0 - 10.5 K/uL   RBC 5.45 (H) 3.87 - 5.11 MIL/uL   Hemoglobin 14.3 12.0 - 15.0 g/dL   HCT 11.9 14.7 - 82.9 %   MCV 84.2 80.0 - 100.0 fL   MCH 26.2 26.0 - 34.0 pg   MCHC 31.2 30.0 - 36.0 g/dL   RDW 56.2 13.0 - 86.5 %   Platelets 487 (H) 150 - 400 K/uL   nRBC 0.0 0.0 - 0.2 %  Comprehensive metabolic panel with GFR   Collection Time: 04/28/24  8:28 AM  Result Value Ref Range   Sodium 135 135 - 145 mmol/L   Potassium 3.5 3.5 - 5.1 mmol/L  Chloride 97 (L) 98 - 111 mmol/L   CO2 26 22 - 32 mmol/L   Glucose, Bld 216 (H) 70 - 99 mg/dL   BUN 16 6 - 20 mg/dL   Creatinine, Ser 0.45 (H) 0.44 - 1.00 mg/dL   Calcium  9.6 8.9 - 10.3 mg/dL   Total Protein 9.0 (H) 6.5 - 8.1 g/dL   Albumin 4.4 3.5 - 5.0 g/dL   AST 34 15 - 41 U/L   ALT 24 0 - 44 U/L   Alkaline Phosphatase 126 38 - 126 U/L   Total Bilirubin 0.9 0.0 - 1.2 mg/dL   GFR, Estimated >40 >98  mL/min   Anion gap 12 5 - 15  Lipase, blood   Collection Time: 04/28/24  8:28 AM  Result Value Ref Range   Lipase 25 11 - 51 U/L  Urinalysis, Routine w reflex microscopic -Urine, Clean Catch   Collection Time: 04/28/24 12:13 PM  Result Value Ref Range   Color, Urine YELLOW YELLOW   APPearance HAZY (A) CLEAR   Specific Gravity, Urine 1.018 1.005 - 1.030   pH 5.0 5.0 - 8.0   Glucose, UA NEGATIVE NEGATIVE mg/dL   Hgb urine dipstick MODERATE (A) NEGATIVE   Bilirubin Urine NEGATIVE NEGATIVE   Ketones, ur NEGATIVE NEGATIVE mg/dL   Protein, ur 119 (A) NEGATIVE mg/dL   Nitrite NEGATIVE NEGATIVE   Leukocytes,Ua LARGE (A) NEGATIVE   RBC / HPF 21-50 0 - 5 RBC/hpf   WBC, UA >50 0 - 5 WBC/hpf   Bacteria, UA RARE (A) NONE SEEN   Squamous Epithelial / HPF 0-5 0 - 5 /HPF   Mucus PRESENT    Budding Yeast PRESENT   Rapid urine drug screen (hospital performed)   Collection Time: 04/28/24 12:14 PM  Result Value Ref Range   Opiates POSITIVE (A) NONE DETECTED   Cocaine NONE DETECTED NONE DETECTED   Benzodiazepines NONE DETECTED NONE DETECTED   Amphetamines NONE DETECTED NONE DETECTED   Tetrahydrocannabinol POSITIVE (A) NONE DETECTED   Barbiturates NONE DETECTED NONE DETECTED   *Note: Due to a large number of results and/or encounters for the requested time period, some results have not been displayed. A complete set of results can be found in Results Review.    EKG None  Radiology No results found.  Procedures Procedures    Medications Ordered in ED Medications  lactated ringers  bolus 1,000 mL (0 mLs Intravenous Stopped 04/28/24 1105)  metoCLOPramide  (REGLAN ) injection 10 mg (10 mg Intravenous Given 04/28/24 0900)  pantoprazole  (PROTONIX ) injection 40 mg (40 mg Intravenous Given 04/28/24 0900)  morphine  (PF) 4 MG/ML injection 4 mg (4 mg Intravenous Given 04/28/24 0859)  ondansetron  (ZOFRAN ) injection 4 mg (4 mg Intravenous Given 04/28/24 1222)  lactated ringers  bolus 1,000 mL (0 mLs  Intravenous Stopped 04/28/24 1222)  cefTRIAXone (ROCEPHIN) 1 g in sodium chloride  0.9 % 100 mL IVPB (1 g Intravenous New Bag/Given 04/28/24 1403)    ED Course/ Medical Decision Making/ A&P                                 Medical Decision Making Problems Addressed: Acute UTI: acute illness or injury with systemic symptoms Dehydration: acute illness or injury with systemic symptoms that poses a threat to life or bodily functions 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 Elevated blood pressure reading: acute illness or injury Essential hypertension: chronic illness or injury with exacerbation, progression, or side effects of treatment  that poses a threat to life or bodily functions Nausea and vomiting in adult: acute illness or injury with systemic symptoms that poses a threat to life or bodily functions Type 1 diabetes mellitus with hyperglycemia (HCC): chronic illness or injury with exacerbation, progression, or side effects of treatment that poses a threat to life or bodily functions  Amount and/or Complexity of Data Reviewed External Data Reviewed: labs and notes. Labs: ordered. Decision-making details documented in ED Course. Radiology: ordered and independent interpretation performed. Decision-making details documented in ED Course.  Risk Prescription drug management. Parenteral controlled substances. Decision regarding hospitalization.   Iv ns. Continuous pulse ox and cardiac monitoring. Labs ordered/sent.   Differential diagnosis includes  . Dispo decision including potential need for admission considered - will get labs and reassess.   Reviewed nursing notes and prior charts for additional history. External reports reviewed. Additional history from: family. Several abd/pelvic cts in past year for similar symptoms - noted to be negative for acute process. Prior UDS's +thc, ?possible CHS. Pt also has hx delayed gastric emptying.   Cardiac monitor: sinus rhythm, rate  110.  LR bolus. Protonix  iv. Reglan  iv. Morphine  iv.   Labs reviewed/interpreted by me - wbc 12, hgb 14. Glucose elevated, hco3 normal. Ag normal. LR bolus.  Lipase normal.  UA w possible uti, rocephin iv. (Pt indicates just got antibiotic called in yesterday and has adequate supply).  Recheck abd soft non tender. Nausea returns. Zofran  iv.   Recheck, no abd tenderness or vomiting. Po fluids.   Pt appears stable for ED d/c.   Rec close pcp f/u.  Return precautions provided.          Final Clinical Impression(s) / ED Diagnoses Final diagnoses:  Nausea and vomiting in adult  Dehydration  Delayed gastric emptying  Type 1 diabetes mellitus with hyperglycemia (HCC)  Acute UTI    Rx / DC Orders ED Discharge Orders     None         Guadalupe Lee, MD 04/28/24 1515

## 2024-04-29 ENCOUNTER — Other Ambulatory Visit: Payer: Self-pay | Admitting: Internal Medicine

## 2024-04-29 ENCOUNTER — Other Ambulatory Visit: Payer: Self-pay

## 2024-04-29 MED ORDER — ONDANSETRON 4 MG PO TBDP
4.0000 mg | ORAL_TABLET | Freq: Three times a day (TID) | ORAL | 0 refills | Status: AC | PRN
  Filled 2024-04-29: qty 20, 7d supply, fill #0

## 2024-04-30 ENCOUNTER — Inpatient Hospital Stay (HOSPITAL_COMMUNITY)
Admission: EM | Admit: 2024-04-30 | Discharge: 2024-05-02 | DRG: 074 | Disposition: A | Discharge status: Home / Self Care | Attending: Family Medicine | Admitting: Family Medicine

## 2024-04-30 ENCOUNTER — Encounter (HOSPITAL_COMMUNITY): Payer: Self-pay | Admitting: Student

## 2024-04-30 ENCOUNTER — Ambulatory Visit: Payer: Self-pay

## 2024-04-30 ENCOUNTER — Other Ambulatory Visit: Payer: Self-pay

## 2024-04-30 ENCOUNTER — Emergency Department (HOSPITAL_COMMUNITY)

## 2024-04-30 DIAGNOSIS — E66813 Obesity, class 3: Secondary | ICD-10-CM | POA: Diagnosis present

## 2024-04-30 DIAGNOSIS — E1043 Type 1 diabetes mellitus with diabetic autonomic (poly)neuropathy: Secondary | ICD-10-CM | POA: Diagnosis not present

## 2024-04-30 DIAGNOSIS — J45909 Unspecified asthma, uncomplicated: Secondary | ICD-10-CM | POA: Diagnosis present

## 2024-04-30 DIAGNOSIS — F32A Depression, unspecified: Secondary | ICD-10-CM | POA: Diagnosis not present

## 2024-04-30 DIAGNOSIS — Z794 Long term (current) use of insulin: Secondary | ICD-10-CM | POA: Diagnosis not present

## 2024-04-30 DIAGNOSIS — Z6841 Body Mass Index (BMI) 40.0 and over, adult: Secondary | ICD-10-CM | POA: Diagnosis not present

## 2024-04-30 DIAGNOSIS — N3000 Acute cystitis without hematuria: Secondary | ICD-10-CM | POA: Diagnosis not present

## 2024-04-30 DIAGNOSIS — E1165 Type 2 diabetes mellitus with hyperglycemia: Secondary | ICD-10-CM | POA: Diagnosis not present

## 2024-04-30 DIAGNOSIS — K3184 Gastroparesis: Secondary | ICD-10-CM | POA: Diagnosis not present

## 2024-04-30 DIAGNOSIS — Z9049 Acquired absence of other specified parts of digestive tract: Secondary | ICD-10-CM | POA: Diagnosis not present

## 2024-04-30 DIAGNOSIS — Z87891 Personal history of nicotine dependence: Secondary | ICD-10-CM

## 2024-04-30 DIAGNOSIS — R112 Nausea with vomiting, unspecified: Secondary | ICD-10-CM | POA: Diagnosis present

## 2024-04-30 DIAGNOSIS — R109 Unspecified abdominal pain: Secondary | ICD-10-CM | POA: Diagnosis not present

## 2024-04-30 DIAGNOSIS — E1065 Type 1 diabetes mellitus with hyperglycemia: Secondary | ICD-10-CM | POA: Diagnosis not present

## 2024-04-30 DIAGNOSIS — Z90710 Acquired absence of both cervix and uterus: Secondary | ICD-10-CM | POA: Diagnosis not present

## 2024-04-30 DIAGNOSIS — Z833 Family history of diabetes mellitus: Secondary | ICD-10-CM

## 2024-04-30 DIAGNOSIS — E1042 Type 1 diabetes mellitus with diabetic polyneuropathy: Secondary | ICD-10-CM | POA: Diagnosis not present

## 2024-04-30 DIAGNOSIS — K219 Gastro-esophageal reflux disease without esophagitis: Secondary | ICD-10-CM | POA: Diagnosis present

## 2024-04-30 DIAGNOSIS — F419 Anxiety disorder, unspecified: Secondary | ICD-10-CM | POA: Diagnosis not present

## 2024-04-30 DIAGNOSIS — Z818 Family history of other mental and behavioral disorders: Secondary | ICD-10-CM

## 2024-04-30 DIAGNOSIS — E785 Hyperlipidemia, unspecified: Secondary | ICD-10-CM | POA: Diagnosis not present

## 2024-04-30 DIAGNOSIS — Z800 Family history of malignant neoplasm of digestive organs: Secondary | ICD-10-CM

## 2024-04-30 DIAGNOSIS — E1143 Type 2 diabetes mellitus with diabetic autonomic (poly)neuropathy: Secondary | ICD-10-CM | POA: Diagnosis present

## 2024-04-30 DIAGNOSIS — Z8249 Family history of ischemic heart disease and other diseases of the circulatory system: Secondary | ICD-10-CM

## 2024-04-30 DIAGNOSIS — I10 Essential (primary) hypertension: Secondary | ICD-10-CM | POA: Diagnosis not present

## 2024-04-30 DIAGNOSIS — K760 Fatty (change of) liver, not elsewhere classified: Secondary | ICD-10-CM | POA: Diagnosis not present

## 2024-04-30 DIAGNOSIS — Z886 Allergy status to analgesic agent status: Secondary | ICD-10-CM

## 2024-04-30 LAB — COMPREHENSIVE METABOLIC PANEL WITH GFR
ALT: 28 U/L (ref 0–44)
AST: 34 U/L (ref 15–41)
Albumin: 4.1 g/dL (ref 3.5–5.0)
Alkaline Phosphatase: 118 U/L (ref 38–126)
Anion gap: 13 (ref 5–15)
BUN: 9 mg/dL (ref 6–20)
CO2: 26 mmol/L (ref 22–32)
Calcium: 9.1 mg/dL (ref 8.9–10.3)
Chloride: 98 mmol/L (ref 98–111)
Creatinine, Ser: 0.95 mg/dL (ref 0.44–1.00)
GFR, Estimated: >60 mL/min (ref >60)
Glucose, Bld: 378 mg/dL — ABNORMAL HIGH (ref 70–99)
Potassium: 3.7 mmol/L (ref 3.5–5.1)
Sodium: 137 mmol/L (ref 135–145)
Total Bilirubin: 0.8 mg/dL (ref 0.0–1.2)
Total Protein: 8.2 g/dL — ABNORMAL HIGH (ref 6.5–8.1)

## 2024-04-30 LAB — RAPID URINE DRUG SCREEN, HOSP PERFORMED
Amphetamines: NOT DETECTED
Barbiturates: NOT DETECTED
Benzodiazepines: NOT DETECTED
Cocaine: NOT DETECTED
Opiates: NOT DETECTED
Tetrahydrocannabinol: POSITIVE — AB

## 2024-04-30 LAB — GLUCOSE, CAPILLARY
Glucose-Capillary: 139 mg/dL — ABNORMAL HIGH (ref 70–99)
Glucose-Capillary: 280 mg/dL — ABNORMAL HIGH (ref 70–99)
Glucose-Capillary: 376 mg/dL — ABNORMAL HIGH (ref 70–99)

## 2024-04-30 LAB — URINALYSIS, ROUTINE W REFLEX MICROSCOPIC
Bilirubin Urine: NEGATIVE
Glucose, UA: >=500 mg/dL — AB
Hgb urine dipstick: NEGATIVE
Ketones, ur: 20 mg/dL — AB
Nitrite: NEGATIVE
Protein, ur: NEGATIVE mg/dL
Specific Gravity, Urine: 1.029 (ref 1.005–1.030)
pH: 6 (ref 5.0–8.0)

## 2024-04-30 LAB — CBG MONITORING, ED: Glucose-Capillary: 359 mg/dL — ABNORMAL HIGH (ref 70–99)

## 2024-04-30 LAB — CBC
HCT: 39.9 % (ref 36.0–46.0)
Hemoglobin: 12.7 g/dL (ref 12.0–15.0)
MCH: 26.6 pg (ref 26.0–34.0)
MCHC: 31.8 g/dL (ref 30.0–36.0)
MCV: 83.6 fL (ref 80.0–100.0)
Platelets: 419 10*3/uL — ABNORMAL HIGH (ref 150–400)
RBC: 4.77 MIL/uL (ref 3.87–5.11)
RDW: 15.1 % (ref 11.5–15.5)
WBC: 10 10*3/uL (ref 4.0–10.5)
nRBC: 0 % (ref 0.0–0.2)

## 2024-04-30 LAB — LIPASE, BLOOD: Lipase: 26 U/L (ref 11–51)

## 2024-04-30 MED ORDER — QUETIAPINE FUMARATE 25 MG PO TABS: 25.0000 mg | ORAL_TABLET | Freq: Every evening | ORAL | Status: DC | PRN

## 2024-04-30 MED ORDER — SODIUM CHLORIDE (PF) 0.9 % IJ SOLN
INTRAMUSCULAR | Status: AC
  Filled 2024-04-30: qty 50

## 2024-04-30 MED ORDER — SODIUM CHLORIDE 0.9 % IV SOLN
2.0000 g | Freq: Once | INTRAVENOUS | Status: AC
  Administered 2024-04-30: 2 g via INTRAVENOUS
  Filled 2024-04-30: qty 20

## 2024-04-30 MED ORDER — HYDROMORPHONE HCL 1 MG/ML IJ SOLN
0.5000 mg | Freq: Once | INTRAMUSCULAR | Status: AC
  Administered 2024-04-30: 0.5 mg via INTRAVENOUS
  Filled 2024-04-30: qty 1

## 2024-04-30 MED ORDER — BUSPIRONE HCL 5 MG PO TABS
15.0000 mg | ORAL_TABLET | Freq: Three times a day (TID) | ORAL | Status: DC
Start: 2024-05-01 — End: 2024-05-02
  Administered 2024-05-01 – 2024-05-02 (×4): 15 mg via ORAL
  Filled 2024-04-30 (×4): qty 3

## 2024-04-30 MED ORDER — ACETAMINOPHEN 325 MG PO TABS: 650.0000 mg | ORAL_TABLET | Freq: Four times a day (QID) | ORAL | Status: DC | PRN

## 2024-04-30 MED ORDER — HYDROXYZINE HCL 25 MG PO TABS
50.0000 mg | ORAL_TABLET | Freq: Three times a day (TID) | ORAL | Status: DC
  Administered 2024-04-30 – 2024-05-02 (×5): 50 mg via ORAL
  Filled 2024-04-30 (×5): qty 2

## 2024-04-30 MED ORDER — CHOLECALCIFEROL 10 MCG (400 UNIT) PO TABS
400.0000 [IU] | ORAL_TABLET | Freq: Every day | ORAL | Status: DC
  Administered 2024-05-01 – 2024-05-02 (×2): 400 [IU] via ORAL
  Filled 2024-04-30 (×2): qty 1

## 2024-04-30 MED ORDER — ONDANSETRON HCL 4 MG/2ML IJ SOLN
4.0000 mg | Freq: Once | INTRAMUSCULAR | Status: AC | PRN
Start: 2024-04-30 — End: 2024-04-30
  Administered 2024-04-30: 4 mg via INTRAVENOUS
  Filled 2024-04-30: qty 2

## 2024-04-30 MED ORDER — GABAPENTIN 300 MG PO CAPS
300.0000 mg | ORAL_CAPSULE | Freq: Three times a day (TID) | ORAL | Status: DC
  Administered 2024-05-01 – 2024-05-02 (×5): 300 mg via ORAL
  Filled 2024-04-30 (×5): qty 1

## 2024-04-30 MED ORDER — ENOXAPARIN SODIUM 60 MG/0.6ML IJ SOSY
55.0000 mg | PREFILLED_SYRINGE | Freq: Every day | INTRAMUSCULAR | Status: DC
  Administered 2024-04-30 – 2024-05-01 (×2): 55 mg via SUBCUTANEOUS
  Filled 2024-04-30 (×3): qty 0.6

## 2024-04-30 MED ORDER — AMLODIPINE BESYLATE 10 MG PO TABS
10.0000 mg | ORAL_TABLET | Freq: Every day | ORAL | Status: DC
Start: 2024-05-01 — End: 2024-05-02
  Administered 2024-05-01 – 2024-05-02 (×2): 10 mg via ORAL
  Filled 2024-04-30 (×2): qty 1

## 2024-04-30 MED ORDER — PANTOPRAZOLE SODIUM 40 MG PO TBEC
40.0000 mg | DELAYED_RELEASE_TABLET | Freq: Every day | ORAL | Status: DC
  Administered 2024-05-01 – 2024-05-02 (×2): 40 mg via ORAL
  Filled 2024-04-30 (×2): qty 1

## 2024-04-30 MED ORDER — PANTOPRAZOLE SODIUM 40 MG IV SOLR
40.0000 mg | Freq: Once | INTRAVENOUS | Status: AC
  Administered 2024-04-30: 40 mg via INTRAVENOUS
  Filled 2024-04-30: qty 10

## 2024-04-30 MED ORDER — SODIUM CHLORIDE 0.9 % IV BOLUS
1000.0000 mL | Freq: Once | INTRAVENOUS | Status: AC
  Administered 2024-04-30: 1000 mL via INTRAVENOUS

## 2024-04-30 MED ORDER — SODIUM CHLORIDE 0.9 % IV SOLN
1.0000 g | INTRAVENOUS | Status: DC
  Administered 2024-05-01: 1 g via INTRAVENOUS
  Filled 2024-04-30: qty 10

## 2024-04-30 MED ORDER — ONDANSETRON HCL 4 MG/2ML IJ SOLN
4.0000 mg | Freq: Four times a day (QID) | INTRAMUSCULAR | Status: DC | PRN
  Administered 2024-04-30: 4 mg via INTRAVENOUS
  Filled 2024-04-30: qty 2

## 2024-04-30 MED ORDER — HYDROMORPHONE HCL 1 MG/ML IJ SOLN
0.5000 mg | Freq: Four times a day (QID) | INTRAMUSCULAR | Status: DC | PRN
  Administered 2024-05-01: 0.5 mg via INTRAVENOUS
  Filled 2024-04-30: qty 0.5

## 2024-04-30 MED ORDER — SENNOSIDES-DOCUSATE SODIUM 8.6-50 MG PO TABS: 1.0000 | ORAL_TABLET | Freq: Every evening | ORAL | Status: DC | PRN

## 2024-04-30 MED ORDER — DULOXETINE HCL 60 MG PO CPEP
60.0000 mg | ORAL_CAPSULE | Freq: Every morning | ORAL | Status: DC
  Administered 2024-05-01 – 2024-05-02 (×2): 60 mg via ORAL
  Filled 2024-04-30 (×2): qty 1

## 2024-04-30 MED ORDER — ACETAMINOPHEN 650 MG RE SUPP: 650.0000 mg | Freq: Four times a day (QID) | RECTAL | Status: DC | PRN

## 2024-04-30 MED ORDER — INSULIN ASPART 100 UNIT/ML IJ SOLN
0.0000 [IU] | INTRAMUSCULAR | Status: DC
  Administered 2024-04-30: 20 [IU] via SUBCUTANEOUS
  Administered 2024-04-30 – 2024-05-01 (×2): 11 [IU] via SUBCUTANEOUS
  Administered 2024-05-01: 7 [IU] via SUBCUTANEOUS
  Administered 2024-05-01: 11 [IU] via SUBCUTANEOUS
  Administered 2024-05-01: 7 [IU] via SUBCUTANEOUS
  Administered 2024-05-01: 11 [IU] via SUBCUTANEOUS
  Administered 2024-05-01: 3 [IU] via SUBCUTANEOUS
  Administered 2024-05-02 (×2): 7 [IU] via SUBCUTANEOUS
  Administered 2024-05-02: 15 [IU] via SUBCUTANEOUS
  Filled 2024-04-30: qty 0.2

## 2024-04-30 MED ORDER — IOHEXOL 300 MG/ML  SOLN
100.0000 mL | Freq: Once | INTRAMUSCULAR | Status: AC | PRN
  Administered 2024-04-30: 100 mL via INTRAVENOUS

## 2024-04-30 MED ORDER — METOCLOPRAMIDE HCL 5 MG/ML IJ SOLN
10.0000 mg | Freq: Once | INTRAMUSCULAR | Status: AC
  Administered 2024-04-30: 10 mg via INTRAVENOUS
  Filled 2024-04-30: qty 2

## 2024-04-30 MED ORDER — ONDANSETRON HCL 4 MG PO TABS
4.0000 mg | ORAL_TABLET | Freq: Four times a day (QID) | ORAL | Status: DC | PRN
Start: 2024-04-30 — End: 2024-05-02

## 2024-04-30 MED ORDER — ATORVASTATIN CALCIUM 40 MG PO TABS
40.0000 mg | ORAL_TABLET | Freq: Every day | ORAL | Status: DC
Start: 2024-05-01 — End: 2024-05-02
  Administered 2024-05-01 – 2024-05-02 (×2): 40 mg via ORAL
  Filled 2024-04-30 (×2): qty 1

## 2024-04-30 MED ORDER — METOCLOPRAMIDE HCL 10 MG PO TABS
10.0000 mg | ORAL_TABLET | Freq: Three times a day (TID) | ORAL | Status: DC
  Administered 2024-05-01 – 2024-05-02 (×4): 10 mg via ORAL
  Filled 2024-04-30 (×4): qty 1

## 2024-04-30 MED ORDER — SODIUM CHLORIDE 0.9 % IV SOLN: INTRAVENOUS | Status: AC

## 2024-04-30 MED ORDER — METOPROLOL TARTRATE 25 MG PO TABS
25.0000 mg | ORAL_TABLET | Freq: Two times a day (BID) | ORAL | Status: DC
  Administered 2024-04-30 – 2024-05-02 (×4): 25 mg via ORAL
  Filled 2024-04-30 (×4): qty 1

## 2024-04-30 NOTE — Telephone Encounter (Signed)
 Noted.

## 2024-04-30 NOTE — ED Provider Notes (Signed)
 [place] [facility] AT [facility] Provider Note   CSN: [id number] Arrival date & time: 04/30/24  7829     History {Add pertinent medical, surgical, social history, OB history to HPI:1} Chief Complaint  Patient presents with   Nausea   Emesis   Abdominal Pain    Whitney Benton is a 53 y.o. female.  Patient with history of diabetes and marijuana use.  She has had gastroparesis before.  Patient was seen here on the seventh with vomiting.  She states she has continued since that time   Emesis Associated symptoms: abdominal pain   Abdominal Pain Associated symptoms: vomiting        Home Medications Prior to Admission medications   Medication Sig Start Date End Date Taking? Authorizing Provider  Accu-Chek Softclix Lancets lancets Use to check blood sugar 3 times daily 08/11/23   Lawrance Presume, MD  albuterol  (VENTOLIN  HFA) 108 (90 Base) MCG/ACT inhaler Inhale 2 puffs into the lungs every 6 (six) hours as needed for wheezing or shortness of breath (Cough). 02/26/23   Lawrance Presume, MD  amLODipine  (NORVASC ) 10 MG tablet Take 1 tablet (10 mg total) by mouth daily. 05/07/23   Hassie Lint, PA-C  Blood Glucose Monitoring Suppl (ACCU-CHEK GUIDE) w/Device KIT Use to check blood sugar 3 times daily. 08/11/23   Lawrance Presume, MD  Blood Pressure Monitoring (BLOOD PRESSURE CUFF) MISC 1 each by Does not apply route daily. Please provide Large cuff 03/23/24   Gerald Kitty., NP  busPIRone  (BUSPAR ) 15 MG tablet Take 1 tablet (15 mg total) by mouth 3 (three) times daily. 03/30/24   Arlyne Bering, NP  cephALEXin  (KEFLEX ) 500 MG capsule Take 1 capsule (500 mg total) by mouth 2 (two) times daily for 7 days. 04/27/24 05/04/24  Lanetta Pion, NP  Continuous Glucose Sensor (DEXCOM G7 SENSOR) MISC Change sensor every 10 days. 03/03/24   Motwani, Komal, MD  cyclobenzaprine  (FLEXERIL ) 10 MG tablet Take 1 tablet (10 mg total) by mouth 3 (three) times daily as needed  for muscle spasms. 04/07/24   Currieo, Andrew D, MD  cycloSPORINE  (RESTASIS ) 0.05 % ophthalmic emulsion Apply 1 drop into both eyes twice a day 09/19/21     DULoxetine  (CYMBALTA ) 20 MG capsule Take 1 capsule (20 mg total) by mouth every morning. 03/30/24   Arlyne Bering, NP  DULoxetine  (CYMBALTA ) 60 MG capsule Take 1 capsule (60 mg total) by mouth daily. 03/30/24   Arlyne Bering, NP  Erenumab -aooe (AIMOVIG ) 140 MG/ML SOAJ Inject 140 mg into the skin every 28 (twenty-eight) days. 03/11/24   Merriam Abbey, DO  furosemide  (LASIX ) 20 MG tablet Take 1 tablet (20 mg total) by mouth daily as needed (weight gain). 03/23/24   Gerald Kitty., NP  gabapentin  (NEURONTIN ) 300 MG capsule Take 1 capsule (300 mg total) by mouth 3 (three) times daily. 03/30/24   Arlyne Bering, NP  Glucagon  (BAQSIMI  ONE PACK) 3 MG/DOSE POWD Place 1 Device into the nose as needed (Low blood sugar with impaired consciousness). 11/24/23   Motwani, Komal, MD  glucose blood (ACCU-CHEK GUIDE) test strip Use to check blood sugar 3 times daily 08/11/23   Lawrance Presume, MD  hydrOXYzine  (ATARAX ) 50 MG tablet Take 1 tablet (50 mg total) by mouth 3 (three) times daily. 03/30/24   Arlyne Bering, NP  ibuprofen  (ADVIL ) 800 MG tablet Take 1 tablet (800 mg total) by mouth 3 (three) times daily.  04/07/24   Currieo, Andrew D, MD  Insulin  Pen Needle (PEN NEEDLES 3/16") 31G X 5 MM MISC Use as directed with insulin  pen 05/07/23   McClung, Angela M, PA-C  insulin  regular human CONCENTRATED (HUMULIN  R U-500 KWIKPEN) 500 UNIT/ML KwikPen Inject 70 Units into the skin daily with breakfast AND 50 Units daily before lunch AND 60 Units daily before supper. Take 30 minutes before meals. 02/16/24   Lawrance Presume, MD  metoCLOPramide  (REGLAN ) 10 MG tablet Take 1 tablet (10 mg total) by mouth 3 (three) times daily before meals. Patient taking differently: Take 10 mg by mouth in the morning. 11/14/23   Newlin, Enobong, MD  metoprolol  tartrate  (LOPRESSOR ) 25 MG tablet Take 1 tablet (25 mg total) by mouth 2 (two) times daily. 03/23/24   Gerald Kitty., NP  Multiple Vitamins-Minerals (EYE VITAMINS PO) Take 1 tablet by mouth daily.    [provider]  OLANZapine -Samidorphan (LYBALVI ) 15-10 MG TABS Take 15 mg by mouth at bedtime. 03/30/24   Arlyne Bering, NP  omeprazole  (PRILOSEC) 40 MG capsule TAKE 1 CAPSULE BY MOUTH ONCE DAILY 30 MINUTES BEFORE BREAKFAST 03/10/23   Arlee Bellows, NP  ondansetron  (ZOFRAN -ODT) 4 MG disintegrating tablet Take 1 tablet (4 mg total) by mouth every 8 (eight) hours as needed for nausea or vomiting. 04/29/24   Lawrance Presume, MD  QUEtiapine  (SEROQUEL ) 25 MG tablet Take 1 tablet (25 mg total) by mouth at bedtime as needed. Patient taking differently: Take 25 mg by mouth at bedtime as needed. HAS NOT STARTED.  HAS NOT PICKED THIS UP-04/07/2024 03/30/24   Arlyne Bering, NP  rosuvastatin  (CRESTOR ) 40 MG tablet Take 1 tablet (40 mg total) by mouth daily. 04/06/24   Motwani, Komal, MD  SUMAtriptan  (IMITREX ) 100 MG tablet TAKE 1 TABLET EARLIEST ONSET OF MIGRAINE. MAY REPEAT IN 2 HOURS IF HEADACHE PERSISTS OR RECURS. MAXIMUM 2 TABLETS IN 24 HOURS. 03/11/24 03/11/25  Merriam Abbey, DO  traZODone  (DESYREL ) 50 MG tablet Take 1-2 tablets (50-100 mg total) by mouth at bedtime as needed for sleep. 03/30/24   Arlyne Bering, NP  vitamin B-12 (CYANOCOBALAMIN) 50 MCG tablet Take 50 mcg by mouth daily.    [provider]  Vitamin D , Cholecalciferol , 10 MCG (400 UNIT) CAPS Take 400 Int'l Units/day by mouth daily. 09/10/23   Lawrance Presume, MD  cetirizine  (ZYRTEC ) 10 MG tablet Take 1 tablet (10 mg total) by mouth daily. Patient not taking: No sig reported 03/31/19 03/03/20  Newlin, Enobong, MD      Allergies    Aspirin, Trulicity  [dulaglutide ], and Oxycodone     Review of Systems   Review of Systems  Gastrointestinal:  Positive for abdominal pain and vomiting.    Physical Exam Updated Vital  Signs BP 136/80   Pulse (!) 109   Temp 98 F (36.7 C) (Oral)   Resp 16   SpO2 97%  Physical Exam  ED Results / Procedures / Treatments   Labs (all labs ordered are listed, but only abnormal results are displayed) Labs Reviewed  COMPREHENSIVE METABOLIC PANEL WITH GFR - Abnormal; Notable for the following components:      Result Value   Glucose, Bld 378 (*)    Total Protein 8.2 (*)    All other components within normal limits  CBC - Abnormal; Notable for the following components:   Platelets 419 (*)    All other components within normal limits  URINALYSIS, ROUTINE W REFLEX MICROSCOPIC - Abnormal;  Notable for the following components:   APPearance HAZY (*)    Glucose, UA >=500 (*)    Ketones, ur 20 (*)    Leukocytes,Ua MODERATE (*)    Bacteria, UA RARE (*)    All other components within normal limits  RAPID URINE DRUG SCREEN, HOSP PERFORMED - Abnormal; Notable for the following components:   Tetrahydrocannabinol POSITIVE (*)    All other components within normal limits  CBG MONITORING, ED - Abnormal; Notable for the following components:   Glucose-Capillary 359 (*)    All other components within normal limits  URINE CULTURE  LIPASE, BLOOD    EKG None  Radiology CT ABDOMEN PELVIS W CONTRAST Result Date: 04/30/2024 CLINICAL DATA:  Abdominal pain, acute, nonlocalized. EXAM: CT ABDOMEN AND PELVIS WITH CONTRAST TECHNIQUE: Multidetector CT imaging of the abdomen and pelvis was performed using the standard protocol following bolus administration of intravenous contrast. RADIATION DOSE REDUCTION: This exam was performed according to the departmental dose-optimization program which includes automated exposure control, adjustment of the mA and/or kV according to patient size and/or use of iterative reconstruction technique. CONTRAST:  OMNIPAQUE  IOHEXOL  300 MG/ML  SOLN COMPARISON:  CT scan abdomen and pelvis from 12/22/2023. FINDINGS: Lower chest: The lung bases are clear. No  pleural effusion. The heart is normal in size. No pericardial effusion. Hepatobiliary: The liver is normal in size. Non-cirrhotic configuration. No suspicious mass. These is mild diffuse hepatic steatosis. Previously seen focal fat within the central liver, segments 4B/5 is less conspicuous. No intrahepatic or extrahepatic bile duct dilation. Gallbladder is surgically absent. Pancreas: Unremarkable. No pancreatic ductal dilatation or surrounding inflammatory changes. Spleen: Within normal limits. No focal lesion. Adrenals/Urinary Tract: Adrenal glands are unremarkable. No suspicious renal mass. No hydronephrosis. Redemonstration of 2-3 mm nonobstructing calculus in the left kidney lower pole. No other nephroureterolithiasis on either side. Unremarkable urinary bladder. Stomach/Bowel: There is a small sliding hiatal hernia. No disproportionate dilation of the small or large bowel loops. No evidence of abnormal bowel wall thickening or inflammatory changes. The appendix is unremarkable. Vascular/Lymphatic: No ascites or pneumoperitoneum. No abdominal or pelvic lymphadenopathy, by size criteria. No aneurysmal dilation of the major abdominal arteries. Reproductive: The uterus is surgically absent. No large adnexal mass. Other: There is a tiny fat containing umbilical hernia. The soft tissues and abdominal wall are otherwise unremarkable. Musculoskeletal: No suspicious osseous lesions. There are mild multilevel degenerative changes in the visualized spine. IMPRESSION: *No acute inflammatory process identified within the abdomen or pelvis. *Multiple other nonacute observations, as described above. Electronically Signed   By: Beula Brunswick M.D.   On: 04/30/2024 12:22    Procedures Procedures  {Document cardiac monitor, telemetry assessment procedure when appropriate:1}  Medications Ordered in ED Medications  ondansetron  (ZOFRAN ) injection 4 mg (4 mg Intravenous Given 04/30/24 1110)  HYDROmorphone  (DILAUDID )  injection 0.5 mg (0.5 mg Intravenous Given 04/30/24 1109)  sodium chloride  0.9 % bolus 1,000 mL (1,000 mLs Intravenous New Bag/Given 04/30/24 1109)  pantoprazole  (PROTONIX ) injection 40 mg (40 mg Intravenous Given 04/30/24 1109)  iohexol  (OMNIPAQUE ) 300 MG/ML solution 100 mL (100 mLs Intravenous Contrast Given 04/30/24 1142)  HYDROmorphone  (DILAUDID ) injection 0.5 mg (0.5 mg Intravenous Given 04/30/24 1257)  sodium chloride  0.9 % bolus 1,000 mL (1,000 mLs Intravenous New Bag/Given 04/30/24 1403)  metoCLOPramide  (REGLAN ) injection 10 mg (10 mg Intravenous Given 04/30/24 1404)  cefTRIAXone  (ROCEPHIN ) 2 g in sodium chloride  0.9 % 100 mL IVPB (2 g Intravenous New Bag/Given 04/30/24 1404)    ED Course/ Medical  Decision Making/ A&P   {   Click here for ABCD2, HEART and other calculatorsREFRESH Note before signing :1}                              Medical Decision Making Amount and/or Complexity of Data Reviewed Labs: ordered. Radiology: ordered.  Risk Prescription drug management. Decision regarding hospitalization.   Patient with gastroparesis.  She has failed emergency department treatment and will be admitted to medicine  {Document critical care time when appropriate:1} {Document review of labs and clinical decision tools ie heart score, Chads2Vasc2 etc:1}  {Document your independent review of radiology images, and any outside records:1} {Document your discussion with family members, caretakers, and with consultants:1} {Document social determinants of health affecting pt's care:1} {Document your decision making why or why not admission, treatments were needed:1} Final Clinical Impression(s) / ED Diagnoses Final diagnoses:  Gastroparesis    Rx / DC Orders ED Discharge Orders     None

## 2024-04-30 NOTE — ED Notes (Signed)
 Pt called out because she had thrown up. Pt also urinated in the bed. Pt is able to ambulate, but states that she does not feel well and is feeling weak. Provider notified.

## 2024-04-30 NOTE — H&P (Addendum)
 History and Physical  Whitney Benton WUJ:[id number] DOB: 03/31/1971 DOA: 04/30/2024  PCP: Lawrance Presume, MD   Chief Complaint: Abdominal pain, nausea and vomiting  HPI: Whitney Benton is a 53 y.o. female with medical history significant for type 2 diabetes, gastroparesis, peripheral neuropathy, arthritis, HLD, anxiety and depression who presented to the ED for evaluation of persistent nausea and vomiting and abdominal pain.  Patient was in her usual state of health until last Sunday when she started having some burning with urination. She had associated nausea and vomiting with mild abdominal discomfort.  She was prescribed Keflex  by her PCPs office after virtual visit on Tuesday.  The next day, she still could not keep any food down so she presented to the ED.  Workup showed possible UTI so she was treated with IV Rocephin  and discharged home to continue on Keflex .  Patient reports that over the last 2 days, her symptoms have not resolved.  She has not been able to keep anything down including her medications for the last 5 days.  She has been compliant with her insulin  regimen but her blood sugars have remained elevated in the 300s to 400s.  Her urinary symptoms have improved but she continues to endorse generalized abdominal pain, nausea, vomiting, and polydipsia.  She denies any fevers, chills, dizziness, headaches, shortness of breath or chest pain.  ED Course: Initial vitals show patient afebrile but hypertensive with SBP in the 140s to 150s.  Labs show blood sugar in the 350s to 370s, Normal lipase, normal kidney function, normal white count, UDS positive for THC, UA still shows some signs of infection but improving compared to 2 days ago.  CT A/P does not show any acute intra-abdominal or pelvic pathology.  Patient received IV Dilaudid , IV Reglan , IV Zosyn, IV Rocephin , IV Protonix  and IV LR 1 L bolus x 2. TR[facility] was consulted for admission.  Review of Systems: Please see HPI for pertinent  positives and negatives. A complete 10 system review of systems are otherwise negative.  Past Medical History:  Diagnosis Date   Allergy    Anxiety    Arthritis    Asthma    Depression    Diabetes mellitus without complication (HCC)    GERD (gastroesophageal reflux disease)    Glaucoma suspect    Hyperlipidemia    Trichomonas infection    Past Surgical History:  Procedure Laterality Date   CESAREAN SECTION     UPPER GASTROINTESTINAL ENDOSCOPY     VAGINAL HYSTERECTOMY  12/23/2004   Fibroids, menorrhagia, benign pathology   Social History:  reports that she quit smoking about 4 years ago. Her smoking use included cigarettes. She started smoking about 12 years ago. She has a 2 pack-year smoking history. She has never used smokeless tobacco. She reports that she does not currently use alcohol. She reports that she does not currently use drugs after having used the following drugs: Marijuana.  Allergies  Allergen Reactions   Aspirin Hives   Trulicity  [Dulaglutide ] Other (See Comments)    DC'd due to chronic gastric and abdominal pain with nausea   Oxycodone  Nausea And Vomiting    Family History  Problem Relation Age of Onset   Diabetes Mother    Mental illness Mother    Depression Mother    Hypertension Mother    Colon cancer Father 12   Hypertension Father    Diabetes Father    Colon cancer Paternal Grandmother    Esophageal cancer Neg Hx  Stomach cancer Neg Hx    Rectal cancer Neg Hx      Prior to Admission medications   Medication Sig Start Date End Date Taking? Authorizing Provider  Accu-Chek Softclix Lancets lancets Use to check blood sugar 3 times daily 08/11/23   Lawrance Presume, MD  albuterol  (VENTOLIN  HFA) 108 (90 Base) MCG/ACT inhaler Inhale 2 puffs into the lungs every 6 (six) hours as needed for wheezing or shortness of breath (Cough). 02/26/23   Lawrance Presume, MD  amLODipine  (NORVASC ) 10 MG tablet Take 1 tablet (10 mg total) by mouth daily. 05/07/23    Hassie Lint, PA-C  Blood Glucose Monitoring Suppl (ACCU-CHEK GUIDE) w/Device KIT Use to check blood sugar 3 times daily. 08/11/23   Lawrance Presume, MD  Blood Pressure Monitoring (BLOOD PRESSURE CUFF) MISC 1 each by Does not apply route daily. Please provide Large cuff 03/23/24   Gerald Kitty., NP  busPIRone  (BUSPAR ) 15 MG tablet Take 1 tablet (15 mg total) by mouth 3 (three) times daily. 03/30/24   Arlyne Bering, NP  cephALEXin  (KEFLEX ) 500 MG capsule Take 1 capsule (500 mg total) by mouth 2 (two) times daily for 7 days. 04/27/24 05/04/24  Lanetta Pion, NP  Continuous Glucose Sensor (DEXCOM G7 SENSOR) MISC Change sensor every 10 days. 03/03/24   Motwani, Komal, MD  cyclobenzaprine  (FLEXERIL ) 10 MG tablet Take 1 tablet (10 mg total) by mouth 3 (three) times daily as needed for muscle spasms. 04/07/24   Currieo, Andrew D, MD  cycloSPORINE  (RESTASIS ) 0.05 % ophthalmic emulsion Apply 1 drop into both eyes twice a day 09/19/21     DULoxetine  (CYMBALTA ) 20 MG capsule Take 1 capsule (20 mg total) by mouth every morning. 03/30/24   Arlyne Bering, NP  DULoxetine  (CYMBALTA ) 60 MG capsule Take 1 capsule (60 mg total) by mouth daily. 03/30/24   Arlyne Bering, NP  Erenumab -aooe (AIMOVIG ) 140 MG/ML SOAJ Inject 140 mg into the skin every 28 (twenty-eight) days. 03/11/24   Merriam Abbey, DO  furosemide  (LASIX ) 20 MG tablet Take 1 tablet (20 mg total) by mouth daily as needed (weight gain). 03/23/24   Gerald Kitty., NP  gabapentin  (NEURONTIN ) 300 MG capsule Take 1 capsule (300 mg total) by mouth 3 (three) times daily. 03/30/24   Arlyne Bering, NP  Glucagon  (BAQSIMI  ONE PACK) 3 MG/DOSE POWD Place 1 Device into the nose as needed (Low blood sugar with impaired consciousness). 11/24/23   Motwani, Komal, MD  glucose blood (ACCU-CHEK GUIDE) test strip Use to check blood sugar 3 times daily 08/11/23   Lawrance Presume, MD  hydrOXYzine  (ATARAX ) 50 MG tablet Take 1 tablet (50 mg total) by mouth 3  (three) times daily. 03/30/24   Arlyne Bering, NP  ibuprofen  (ADVIL ) 800 MG tablet Take 1 tablet (800 mg total) by mouth 3 (three) times daily. 04/07/24   Currieo, Andrew D, MD  Insulin  Pen Needle (PEN NEEDLES 3/16") 31G X 5 MM MISC Use as directed with insulin  pen 05/07/23   McClung, Angela M, PA-C  insulin  regular human CONCENTRATED (HUMULIN  R U-500 KWIKPEN) 500 UNIT/ML KwikPen Inject 70 Units into the skin daily with breakfast AND 50 Units daily before lunch AND 60 Units daily before supper. Take 30 minutes before meals. 02/16/24   Lawrance Presume, MD  metoCLOPramide  (REGLAN ) 10 MG tablet Take 1 tablet (10 mg total) by mouth 3 (three) times daily before meals. Patient taking differently: Take 10 mg  by mouth in the morning. 11/14/23   Newlin, Enobong, MD  metoprolol  tartrate (LOPRESSOR ) 25 MG tablet Take 1 tablet (25 mg total) by mouth 2 (two) times daily. 03/23/24   Gerald Kitty., NP  Multiple Vitamins-Minerals (EYE VITAMINS PO) Take 1 tablet by mouth daily.    [provider]  OLANZapine -Samidorphan (LYBALVI ) 15-10 MG TABS Take 15 mg by mouth at bedtime. 03/30/24   Arlyne Bering, NP  omeprazole  (PRILOSEC) 40 MG capsule TAKE 1 CAPSULE BY MOUTH ONCE DAILY 30 MINUTES BEFORE BREAKFAST 03/10/23   Arlee Bellows, NP  ondansetron  (ZOFRAN -ODT) 4 MG disintegrating tablet Take 1 tablet (4 mg total) by mouth every 8 (eight) hours as needed for nausea or vomiting. 04/29/24   Lawrance Presume, MD  QUEtiapine  (SEROQUEL ) 25 MG tablet Take 1 tablet (25 mg total) by mouth at bedtime as needed. Patient taking differently: Take 25 mg by mouth at bedtime as needed. HAS NOT STARTED.  HAS NOT PICKED THIS UP-04/07/2024 03/30/24   Arlyne Bering, NP  rosuvastatin  (CRESTOR ) 40 MG tablet Take 1 tablet (40 mg total) by mouth daily. 04/06/24   Motwani, Komal, MD  SUMAtriptan  (IMITREX ) 100 MG tablet TAKE 1 TABLET EARLIEST ONSET OF MIGRAINE. MAY REPEAT IN 2 HOURS IF HEADACHE PERSISTS OR RECURS. MAXIMUM 2  TABLETS IN 24 HOURS. 03/11/24 03/11/25  Merriam Abbey, DO  traZODone  (DESYREL ) 50 MG tablet Take 1-2 tablets (50-100 mg total) by mouth at bedtime as needed for sleep. 03/30/24   Arlyne Bering, NP  vitamin B-12 (CYANOCOBALAMIN) 50 MCG tablet Take 50 mcg by mouth daily.    [provider]  Vitamin D , Cholecalciferol , 10 MCG (400 UNIT) CAPS Take 400 Int'l Units/day by mouth daily. 09/10/23   Lawrance Presume, MD  cetirizine  (ZYRTEC ) 10 MG tablet Take 1 tablet (10 mg total) by mouth daily. Patient not taking: No sig reported 03/31/19 03/03/20  Joaquin Mulberry, MD    Physical Exam: BP (!) 154/87 (BP Location: Right Arm)   Pulse (!) 114   Temp 98.2 F (36.8 C) (Oral)   Resp 16   SpO2 95%  General: Pleasant, sick-appearing obese middle-age woman sitting in bed. No acute distress. HEENT: [place]/AT. Anicteric sclera. Dry mucous membrane. CV: Tachycardic. Regular rhythm. No murmurs, rubs, or gallops. No LE edema Pulmonary: Lungs CTAB. Normal effort. No wheezing or rales. Abdominal: Soft, nondistended. Mild generalized tenderness. Normal bowel sounds. Extremities: Palpable radial and DP pulses. Normal ROM. Skin: Warm and dry. No obvious rash or lesions. Decreased skin turgor. Neuro: A&Ox3. Moves all extremities. Normal sensation to light touch. No focal deficit. Psych: Normal mood and affect          Labs on Admission:  Basic Metabolic Panel: Recent Labs  Lab 04/28/24 0828 04/30/24 1017  NA 135 137  K 3.5 3.7  CL 97* 98  CO2 26 26  GLUCOSE 216* 378*  BUN 16 9  CREATININE 1.09* 0.95  CALCIUM  9.6 9.1   Liver Function Tests: Recent Labs  Lab 04/28/24 0828 04/30/24 1017  AST 34 34  ALT 24 28  ALKPHOS 126 118  BILITOT 0.9 0.8  PROT 9.0* 8.2*  ALBUMIN 4.4 4.1   Recent Labs  Lab 04/28/24 0828 04/30/24 1017  LIPASE 25 26   No results for input(s): "AMMONIA" in the last 168 hours. CBC: Recent Labs  Lab 04/28/24 0828 04/30/24 1017  WBC 12.4* 10.0  HGB 14.3 12.7   HCT 45.9 39.9  MCV 84.2 83.6  PLT  487* 419*   Cardiac Enzymes: No results for input(s): "CKTOTAL", "CKMB", "CKMBINDEX", "TROPONINI" in the last 168 hours. BNP (last 3 results) No results for input(s): "BNP" in the last 8760 hours.  ProBNP (last 3 results) No results for input(s): "PROBNP" in the last 8760 hours.  CBG: Recent Labs  Lab 04/28/24 0814 04/30/24 1011 04/30/24 1719  GLUCAP 209* 359* 376*    Radiological Exams on Admission: CT ABDOMEN PELVIS W CONTRAST Result Date: 04/30/2024 CLINICAL DATA:  Abdominal pain, acute, nonlocalized. EXAM: CT ABDOMEN AND PELVIS WITH CONTRAST TECHNIQUE: Multidetector CT imaging of the abdomen and pelvis was performed using the standard protocol following bolus administration of intravenous contrast. RADIATION DOSE REDUCTION: This exam was performed according to the departmental dose-optimization program which includes automated exposure control, adjustment of the mA and/or kV according to patient size and/or use of iterative reconstruction technique. CONTRAST:  OMNIPAQUE  IOHEXOL  300 MG/ML  SOLN COMPARISON:  CT scan abdomen and pelvis from 12/22/2023. FINDINGS: Lower chest: The lung bases are clear. No pleural effusion. The heart is normal in size. No pericardial effusion. Hepatobiliary: The liver is normal in size. Non-cirrhotic configuration. No suspicious mass. These is mild diffuse hepatic steatosis. Previously seen focal fat within the central liver, segments 4B/5 is less conspicuous. No intrahepatic or extrahepatic bile duct dilation. Gallbladder is surgically absent. Pancreas: Unremarkable. No pancreatic ductal dilatation or surrounding inflammatory changes. Spleen: Within normal limits. No focal lesion. Adrenals/Urinary Tract: Adrenal glands are unremarkable. No suspicious renal mass. No hydronephrosis. Redemonstration of 2-3 mm nonobstructing calculus in the left kidney lower pole. No other nephroureterolithiasis on either side. Unremarkable  urinary bladder. Stomach/Bowel: There is a small sliding hiatal hernia. No disproportionate dilation of the small or large bowel loops. No evidence of abnormal bowel wall thickening or inflammatory changes. The appendix is unremarkable. Vascular/Lymphatic: No ascites or pneumoperitoneum. No abdominal or pelvic lymphadenopathy, by size criteria. No aneurysmal dilation of the major abdominal arteries. Reproductive: The uterus is surgically absent. No large adnexal mass. Other: There is a tiny fat containing umbilical hernia. The soft tissues and abdominal wall are otherwise unremarkable. Musculoskeletal: No suspicious osseous lesions. There are mild multilevel degenerative changes in the visualized spine. IMPRESSION: *No acute inflammatory process identified within the abdomen or pelvis. *Multiple other nonacute observations, as described above. Electronically Signed   By: Beula Brunswick M.D.   On: 04/30/2024 12:22   Assessment/Plan Whitney Benton is a 54 y.o. female with medical history significant for type 1 diabetes, gastroparesis, peripheral neuropathy, arthritis, HLD, anxiety and depression who presented to the ED for evaluation of persistent nausea and vomiting and abdominal pain and admitted for intractable nausea and vomiting in the setting of gastroparesis  # Intractable nausea and vomiting # Diabetic gastroparesis - Had a gastric emptying studying a year ago that showed delayed gastric emptying - CT A/P does not show any intra-abdominal pathology - Has not tolerated po intake for the last 5 days, mild abd tenderness on exam - Admit for IV hydration and antiemetics - Continue home Reglan  10 mg 3 times daily, monitor QTc closely - IV dilaudid  as needed for pain - Start clear liquid diet, advance as tolerated - F/u mag and phos  # Acute cystitis - Diagnosed earlier this week - UA still with infections of infection, but improving - Continue IV rocephin  - Follow up urine culture  # Type 2  diabetes mellitus, uncontrolled with hyperglycemia # Peripheral neuropathy - Last A1c 9.8% over 2 weeks ago - CBGs in  the 300s on admission - States home regimen includes humulin  R U-500 80u in morning, 60u at lunch and 75u at night - SSI for now with Q4H CBGs - F/u repeat A1c  - Continue gabapentin   # HTN - BP elevated with SBP in 140s-150s in the setting of N/V and abd pain - Continue amlodipine  and metoprolol   # Anxiety and depression - Continue duloxetine , BuSpar  and hydroxyzine   # HLD - Continue atorvastatin   # GERD - PPI  # Class III obesity Body mass index is 41.38 kg/m. Filed Weights   04/30/24 1746  Weight: 106 kg  - Follow up with PCP in the outpatient for nutrition and weight loss counseling  DVT prophylaxis: Lovenox      Code Status: Full Code  Consults called: None  Family Communication: No family at bedside  Severity of Illness: The appropriate patient status for this patient is OBSERVATION. Observation status is judged to be reasonable and necessary in order to provide the required intensity of service to ensure the patient's safety. The patient's presenting symptoms, physical exam findings, and initial radiographic and laboratory data in the context of their medical condition is felt to place them at decreased risk for further clinical deterioration. Furthermore, it is anticipated that the patient will be medically stable for discharge from the hospital within 2 midnights of admission.   Level of care: Med-Surg   This record has been created using Conservation officer, historic buildings. Errors have been sought and corrected, but may not always be located. Such creation errors do not reflect on the standard of care.   Vita Grip, MD 04/30/2024, 5:30 PM [facility] Pager: [phone] Isaiah 41:10   If 7PM-7AM, please contact night-coverage [email or web address] Password [email or web address]

## 2024-04-30 NOTE — Telephone Encounter (Signed)
 Pharmacy Patient Advocate Encounter  Received notification from [employer] that Prior Authorization for Dexcom G7 sensor has been APPROVED through 03/05/25   PA #/Case ID/Reference #:  [id number]

## 2024-04-30 NOTE — ED Triage Notes (Signed)
 Pt c/o N/V, and abd pain for 5x days. Has not been able to take any of their meds other than insulin .

## 2024-04-30 NOTE — Telephone Encounter (Signed)
 Copied from CRM [id number]. Topic: Clinical - Red Word Triage >> Apr 30, 2024  8:57 AM Donald Frost wrote: Red Word that prompted transfer to Nurse Triage: The patient has been fighting a Uti for awhile now culminating in a visit to the ER yesterday. She has meds for the Uti and nausea but she cannot hold anything down. She continues to have weakness, vomiting and nausea. I will transfer her to E2C2 NT  Chief Complaint: n/v after dx of UTI; was seen in ER; now lips and mouth very dry Symptoms: see above Frequency: constant Pertinent Negatives: Patient denies fever Disposition: [x] ED /[] Urgent Care (no appt availability in office) / [] Appointment(In office/virtual)/ []  [facility]/ [] Home Care/ [] Refused Recommended Disposition /[] [facility]/ []  Follow-up with PCP Additional Notes: instructed to go to ER; PCP office updated.   Reason for Disposition  [1] Decreased urination and [2] drinking very little AND [2] dehydration suspected (e.g., dark urine, no urine > 12 hours, very dry mouth, very lightheaded)  Answer Assessment - Initial Assessment Questions 1. SYMPTOM: "What's the main symptom you're concerned about?" (e.g., frequency, incontinence)     Painful urination started on antibiotics yesterday and was able to take one pill, developed nausea and vomiting 2. ONSET: "When did the  n/v  start?"     States Sunday 3. PAIN: "Is there any pain?" If Yes, ask: "How bad is it?" (Scale: 1-10; mild, moderate, severe)     With urination states it's not as bad 4. CAUSE: "What do you think is causing the symptoms?"     antibiotic 5. OTHER SYMPTOMS: "Do you have any other symptoms?" (e.g., blood in urine, fever, flank pain, pain with urination)     denies 6. PREGNANCY: "Is there any chance you are pregnant?" "When was your last menstrual period?"     na  Protocols used: Urinary Symptoms-A-AH

## 2024-05-01 DIAGNOSIS — E1143 Type 2 diabetes mellitus with diabetic autonomic (poly)neuropathy: Secondary | ICD-10-CM | POA: Diagnosis not present

## 2024-05-01 DIAGNOSIS — Z87891 Personal history of nicotine dependence: Secondary | ICD-10-CM | POA: Diagnosis not present

## 2024-05-01 DIAGNOSIS — R112 Nausea with vomiting, unspecified: Secondary | ICD-10-CM

## 2024-05-01 DIAGNOSIS — Z8249 Family history of ischemic heart disease and other diseases of the circulatory system: Secondary | ICD-10-CM | POA: Diagnosis not present

## 2024-05-01 DIAGNOSIS — E1043 Type 1 diabetes mellitus with diabetic autonomic (poly)neuropathy: Secondary | ICD-10-CM | POA: Diagnosis not present

## 2024-05-01 DIAGNOSIS — F419 Anxiety disorder, unspecified: Secondary | ICD-10-CM | POA: Diagnosis not present

## 2024-05-01 DIAGNOSIS — Z886 Allergy status to analgesic agent status: Secondary | ICD-10-CM | POA: Diagnosis not present

## 2024-05-01 DIAGNOSIS — K219 Gastro-esophageal reflux disease without esophagitis: Secondary | ICD-10-CM | POA: Diagnosis not present

## 2024-05-01 DIAGNOSIS — E1065 Type 1 diabetes mellitus with hyperglycemia: Secondary | ICD-10-CM | POA: Diagnosis not present

## 2024-05-01 DIAGNOSIS — R109 Unspecified abdominal pain: Secondary | ICD-10-CM | POA: Diagnosis not present

## 2024-05-01 DIAGNOSIS — F32A Depression, unspecified: Secondary | ICD-10-CM | POA: Diagnosis not present

## 2024-05-01 DIAGNOSIS — E1042 Type 1 diabetes mellitus with diabetic polyneuropathy: Secondary | ICD-10-CM | POA: Diagnosis not present

## 2024-05-01 DIAGNOSIS — N3000 Acute cystitis without hematuria: Secondary | ICD-10-CM | POA: Diagnosis not present

## 2024-05-01 DIAGNOSIS — E66813 Obesity, class 3: Secondary | ICD-10-CM | POA: Diagnosis not present

## 2024-05-01 DIAGNOSIS — E785 Hyperlipidemia, unspecified: Secondary | ICD-10-CM | POA: Diagnosis not present

## 2024-05-01 DIAGNOSIS — Z818 Family history of other mental and behavioral disorders: Secondary | ICD-10-CM | POA: Diagnosis not present

## 2024-05-01 DIAGNOSIS — I10 Essential (primary) hypertension: Secondary | ICD-10-CM | POA: Diagnosis not present

## 2024-05-01 DIAGNOSIS — Z833 Family history of diabetes mellitus: Secondary | ICD-10-CM | POA: Diagnosis not present

## 2024-05-01 DIAGNOSIS — K3184 Gastroparesis: Secondary | ICD-10-CM | POA: Diagnosis not present

## 2024-05-01 DIAGNOSIS — Z9049 Acquired absence of other specified parts of digestive tract: Secondary | ICD-10-CM | POA: Diagnosis not present

## 2024-05-01 DIAGNOSIS — J45909 Unspecified asthma, uncomplicated: Secondary | ICD-10-CM | POA: Diagnosis not present

## 2024-05-01 DIAGNOSIS — Z794 Long term (current) use of insulin: Secondary | ICD-10-CM | POA: Diagnosis not present

## 2024-05-01 DIAGNOSIS — K760 Fatty (change of) liver, not elsewhere classified: Secondary | ICD-10-CM | POA: Diagnosis not present

## 2024-05-01 DIAGNOSIS — Z800 Family history of malignant neoplasm of digestive organs: Secondary | ICD-10-CM | POA: Diagnosis not present

## 2024-05-01 DIAGNOSIS — Z90710 Acquired absence of both cervix and uterus: Secondary | ICD-10-CM | POA: Diagnosis not present

## 2024-05-01 DIAGNOSIS — Z6841 Body Mass Index (BMI) 40.0 and over, adult: Secondary | ICD-10-CM | POA: Diagnosis not present

## 2024-05-01 LAB — GLUCOSE, CAPILLARY
Glucose-Capillary: 284 mg/dL — ABNORMAL HIGH (ref 70–99)
Glucose-Capillary: 225 mg/dL — ABNORMAL HIGH (ref 70–99)
Glucose-Capillary: 270 mg/dL — ABNORMAL HIGH (ref 70–99)
Glucose-Capillary: 215 mg/dL — ABNORMAL HIGH (ref 70–99)
Glucose-Capillary: 265 mg/dL — ABNORMAL HIGH (ref 70–99)

## 2024-05-01 LAB — CBC
HCT: 34.4 % — ABNORMAL LOW (ref 36.0–46.0)
Hemoglobin: 10.8 g/dL — ABNORMAL LOW (ref 12.0–15.0)
MCH: 26.7 pg (ref 26.0–34.0)
MCHC: 31.4 g/dL (ref 30.0–36.0)
MCV: 85.1 fL (ref 80.0–100.0)
Platelets: 382 10*3/uL (ref 150–400)
RBC: 4.04 MIL/uL (ref 3.87–5.11)
RDW: 15.3 % (ref 11.5–15.5)
WBC: 8.8 10*3/uL (ref 4.0–10.5)
nRBC: 0 % (ref 0.0–0.2)

## 2024-05-01 LAB — URINE CULTURE

## 2024-05-01 LAB — HEMOGLOBIN A1C
Hgb A1c MFr Bld: 10 % — ABNORMAL HIGH (ref 4.8–5.6)
Mean Plasma Glucose: 240.3 mg/dL

## 2024-05-01 LAB — BASIC METABOLIC PANEL WITH GFR
Anion gap: 8 (ref 5–15)
BUN: 8 mg/dL (ref 6–20)
CO2: 24 mmol/L (ref 22–32)
Calcium: 8.5 mg/dL — ABNORMAL LOW (ref 8.9–10.3)
Chloride: 107 mmol/L (ref 98–111)
Creatinine, Ser: 0.81 mg/dL (ref 0.44–1.00)
GFR, Estimated: >60 mL/min (ref >60)
Glucose, Bld: 276 mg/dL — ABNORMAL HIGH (ref 70–99)
Potassium: 3.7 mmol/L (ref 3.5–5.1)
Sodium: 139 mmol/L (ref 135–145)

## 2024-05-01 LAB — MAGNESIUM: Magnesium: 2.1 mg/dL (ref 1.7–2.4)

## 2024-05-01 LAB — PHOSPHORUS: Phosphorus: 2.8 mg/dL (ref 2.5–4.6)

## 2024-05-01 NOTE — Plan of Care (Signed)

## 2024-05-01 NOTE — Plan of Care (Signed)

## 2024-05-01 NOTE — Progress Notes (Signed)
 PROGRESS NOTE    CHANDEL DINGMANN  JXB:[id number] DOB: 05-Feb-1971 DOA: 04/30/2024 PCP: Lawrance Presume, MD   Brief Narrative: This 53 yrs old female with medical history significant for type 2 diabetes, gastroparesis, peripheral neuropathy, arthritis, HLD, anxiety and depression who presented to the ED for evaluation of persistent nausea and vomiting and abdominal pain.  Patient has been dealing with urinary symptoms for last few days and was prescribed Keflex  by ED physician.  Patient reported symptom has not resolved.  She is unable to keep anything down so she presented in the ED.  She has uncontrolled diabetes states her blood glucose remains in the 300s and 400s despite she is very compliant with her insulin  regimen.  Patient was admitted for further evaluation.  CT A/P did not show any acute abnormality.  Assessment & Plan:   Principal Problem:   Intractable nausea and vomiting Active Problems:   Diabetic gastroparesis (HCC)   Uncontrolled type 2 diabetes mellitus with hyperglycemia, with long-term current use of insulin  (HCC)  Intractable nausea and vomiting: Diabetic gastroparesis: She had gastric emptying studying a year ago that showed delayed gastric emptying. CT A/P does not show any intra-abdominal pathology. She has not tolerated po intake for the last 5 days, mild abd tenderness noted on exam Continue IV hydration and antiemetics. Continue Reglan  10 mg 3 times daily, monitor QTc closely Continue IV dilaudid  as needed for pain Start clear liquid diet, advance as tolerated Monitor Electrolytes.   Acute cystitis: She was diagnosed last week. UA still with infections of infection, but improving Continue IV rocephin  Follow up urine culture   Type 2 diabetes mellitus, uncontrolled with hyperglycemia: Peripheral neuropathy: Last HbA1c 9.8% over 2 weeks ago. CBGs in the 300s on admission At home on humulin  R U-500 80u in morning, 60u at lunch and 75u at night Continue SSI  for now with Q4H CBGs F/u repeat A1c  Continue gabapentin    Essential HTN: BP elevated with SBP in 140s-150s in the setting of N/V and abd pain. Continue amlodipine  and metoprolol .   Anxiety and depression: Continue duloxetine , BuSpar  and hydroxyzine .   HLD: Continue atorvastatin .   GERD: Continue PPI.  Morbid obesity:  Diet and exercise discussed in detail.  DVT prophylaxis: Lovenox  Code Status: Full code Family Communication: No family at bedside Disposition Plan:   Status is: Observation The patient remains OBS appropriate and will d/c before 2 midnights.    Consultants:  None  Procedures: CT A/P  Antimicrobials:  Anti-infectives (From admission, onward)    Start     Dose/Rate Route Frequency Ordered Stop   05/01/24 1400  cefTRIAXone  (ROCEPHIN ) 1 g in sodium chloride  0.9 % 100 mL IVPB        1 g 200 mL/hr over 30 Minutes Intravenous Every 24 hours 04/30/24 2114     04/30/24 1330  cefTRIAXone  (ROCEPHIN ) 2 g in sodium chloride  0.9 % 100 mL IVPB        2 g 200 mL/hr over 30 Minutes Intravenous  Once 04/30/24 1322 04/30/24 1434       Subjective: Patient was seen and examined at bedside.  Overnight events noted. Still reports having persistent nausea and vomiting but improving.   Reports abdominal pain has improved.  Objective: Vitals:   04/30/24 2344 05/01/24 0404 05/01/24 0747 05/01/24 1142  BP: 120/83 105/68 125/80 125/78  Pulse: (!) 105 87 77 83  Resp: 16 14 20 20   Temp: 98 F (36.7 C) 98.2 F (36.8 C) 98.2 F (36.8  C) 97.6 F (36.4 C)  TempSrc: Oral Oral Oral Oral  SpO2: 99% 97% 99% 98%  Weight:      Height:        Intake/Output Summary (Last 24 hours) at 05/01/2024 1347 Last data filed at 05/01/2024 0411 Gross per 24 hour  Intake 1012.7 ml  Output 900 ml  Net 112.7 ml   Filed Weights   04/30/24 1746  Weight: 106 kg    Examination:  General exam: Appears calm and comfortable, in any acute distress Respiratory system: Clear to  auscultation. Respiratory effort normal.  RR 16 Cardiovascular system: S1 & S2 heard, RRR. No JVD, murmurs, rubs, gallops or clicks. No pedal edema. Gastrointestinal system: Abdomen is non distended, soft and non tender. Normal bowel sounds heard. Central nervous system: Alert and oriented x 3. No focal neurological deficits. Extremities: No edema, no cyanosis, no clubbing Skin: No rashes, lesions or ulcers Psychiatry: Judgement and insight appear normal. Mood & affect appropriate.     Data Reviewed: I have personally reviewed following labs and imaging studies  CBC: Recent Labs  Lab 04/28/24 0828 04/30/24 1017 05/01/24 0856  WBC 12.4* 10.0 8.8  HGB 14.3 12.7 10.8*  HCT 45.9 39.9 34.4*  MCV 84.2 83.6 85.1  PLT 487* 419* 382   Basic Metabolic Panel: Recent Labs  Lab 04/28/24 0828 04/30/24 1017 05/01/24 0856  NA 135 137 139  K 3.5 3.7 3.7  CL 97* 98 107  CO2 26 26 24   GLUCOSE 216* 378* 276*  BUN 16 9 8   CREATININE 1.09* 0.95 0.81  CALCIUM  9.6 9.1 8.5*  MG  --   --  2.1  PHOS  --   --  2.8   GFR: Estimated Creatinine Clearance: 93.6 mL/min (by C-G formula based on SCr of 0.81 mg/dL). Liver Function Tests: Recent Labs  Lab 04/28/24 0828 04/30/24 1017  AST 34 34  ALT 24 28  ALKPHOS 126 118  BILITOT 0.9 0.8  PROT 9.0* 8.2*  ALBUMIN 4.4 4.1   Recent Labs  Lab 04/28/24 0828 04/30/24 1017  LIPASE 25 26   No results for input(s): "AMMONIA" in the last 168 hours. Coagulation Profile: No results for input(s): "INR", "PROTIME" in the last 168 hours. Cardiac Enzymes: No results for input(s): "CKTOTAL", "CKMB", "CKMBINDEX", "TROPONINI" in the last 168 hours. BNP (last 3 results) No results for input(s): "PROBNP" in the last 8760 hours. HbA1C: Recent Labs    05/01/24 0856  HGBA1C 10.0*   CBG: Recent Labs  Lab 04/30/24 1952 04/30/24 2348 05/01/24 0406 05/01/24 0744 05/01/24 1145  GLUCAP 280* 139* 284* 225* 270*   Lipid Profile: No results for  input(s): "CHOL", "HDL", "LDLCALC", "TRIG", "CHOLHDL", "LDLDIRECT" in the last 72 hours. Thyroid Function Tests: No results for input(s): "TSH", "T4TOTAL", "FREET4", "T3FREE", "THYROIDAB" in the last 72 hours. Anemia Panel: No results for input(s): "VITAMINB12", "FOLATE", "FERRITIN", "TIBC", "IRON", "RETICCTPCT" in the last 72 hours. Sepsis Labs: No results for input(s): "PROCALCITON", "LATICACIDVEN" in the last 168 hours.  No results found for this or any previous visit (from the past 240 hours).   Radiology Studies: CT ABDOMEN PELVIS W CONTRAST Result Date: 04/30/2024 CLINICAL DATA:  Abdominal pain, acute, nonlocalized. EXAM: CT ABDOMEN AND PELVIS WITH CONTRAST TECHNIQUE: Multidetector CT imaging of the abdomen and pelvis was performed using the standard protocol following bolus administration of intravenous contrast. RADIATION DOSE REDUCTION: This exam was performed according to the departmental dose-optimization program which includes automated exposure control, adjustment of the mA and/or kV  according to patient size and/or use of iterative reconstruction technique. CONTRAST:  OMNIPAQUE  IOHEXOL  300 MG/ML  SOLN COMPARISON:  CT scan abdomen and pelvis from 12/22/2023. FINDINGS: Lower chest: The lung bases are clear. No pleural effusion. The heart is normal in size. No pericardial effusion. Hepatobiliary: The liver is normal in size. Non-cirrhotic configuration. No suspicious mass. These is mild diffuse hepatic steatosis. Previously seen focal fat within the central liver, segments 4B/5 is less conspicuous. No intrahepatic or extrahepatic bile duct dilation. Gallbladder is surgically absent. Pancreas: Unremarkable. No pancreatic ductal dilatation or surrounding inflammatory changes. Spleen: Within normal limits. No focal lesion. Adrenals/Urinary Tract: Adrenal glands are unremarkable. No suspicious renal mass. No hydronephrosis. Redemonstration of 2-3 mm nonobstructing calculus in the left kidney  lower pole. No other nephroureterolithiasis on either side. Unremarkable urinary bladder. Stomach/Bowel: There is a small sliding hiatal hernia. No disproportionate dilation of the small or large bowel loops. No evidence of abnormal bowel wall thickening or inflammatory changes. The appendix is unremarkable. Vascular/Lymphatic: No ascites or pneumoperitoneum. No abdominal or pelvic lymphadenopathy, by size criteria. No aneurysmal dilation of the major abdominal arteries. Reproductive: The uterus is surgically absent. No large adnexal mass. Other: There is a tiny fat containing umbilical hernia. The soft tissues and abdominal wall are otherwise unremarkable. Musculoskeletal: No suspicious osseous lesions. There are mild multilevel degenerative changes in the visualized spine. IMPRESSION: *No acute inflammatory process identified within the abdomen or pelvis. *Multiple other nonacute observations, as described above. Electronically Signed   By: Beula Brunswick M.D.   On: 04/30/2024 12:22    Scheduled Meds:  amLODipine   10 mg Oral Daily   atorvastatin   40 mg Oral Daily   busPIRone   15 mg Oral TID   cholecalciferol   400 Units Oral Daily   DULoxetine   60 mg Oral q AM   enoxaparin  (LOVENOX ) injection  55 mg Subcutaneous QHS   gabapentin   300 mg Oral TID   hydrOXYzine   50 mg Oral TID   insulin  aspart  0-20 Units Subcutaneous Q4H   metoCLOPramide   10 mg Oral TID AC   metoprolol  tartrate  25 mg Oral BID   pantoprazole   40 mg Oral Daily   Continuous Infusions:  sodium chloride  150 mL/hr at 05/01/24 0010   cefTRIAXone  (ROCEPHIN )  IV       LOS: 0 days    Time spent: 50 mins    Magdalene School, MD [facility]   If 7PM-7AM, please contact night-coverage

## 2024-05-02 DIAGNOSIS — R112 Nausea with vomiting, unspecified: Secondary | ICD-10-CM | POA: Diagnosis not present

## 2024-05-02 LAB — GLUCOSE, CAPILLARY
Glucose-Capillary: 245 mg/dL — ABNORMAL HIGH (ref 70–99)
Glucose-Capillary: 246 mg/dL — ABNORMAL HIGH (ref 70–99)
Glucose-Capillary: 322 mg/dL — ABNORMAL HIGH (ref 70–99)

## 2024-05-02 NOTE — Discharge Instructions (Signed)
 Advised to take Keflex  500 mg twice daily for 3 more days for UTI. Advised to take Reglan  as needed for intractable nausea and vomiting

## 2024-05-02 NOTE — Plan of Care (Signed)

## 2024-05-02 NOTE — Discharge Summary (Signed)
 Physician Discharge Summary  Whitney Benton:[id number] DOB: 06/27/1971 DOA: 04/30/2024  PCP: Lawrance Presume, MD  Admit date: 04/30/2024  Discharge date: 05/02/2024  Admitted From: Home  Disposition:  Home.  Recommendations for Outpatient Follow-up:  Follow up with PCP in 1-2 weeks. Please obtain BMP/CBC in one week. Advised to take Keflex  500 mg twice daily for 3 more days for UTI. Advised to take Reglan  as needed for intractable nausea and vomiting.  Home Health: None Equipment/Devices: None  Discharge Condition: Stable CODE STATUS:Full code Diet recommendation: Heart Healthy  Brief Summary / Hospital Course: This 53 yrs old female with medical history significant for type 2 diabetes, gastroparesis, peripheral neuropathy, arthritis, HLD, anxiety and depression who presented to the ED for evaluation of persistent nausea and vomiting and abdominal pain.  Patient has been dealing with urinary symptoms for last few days and was prescribed Keflex  by ED physician.  Patient reported symptom has not resolved.  She is unable to keep anything down so she presented in the ED.  She has uncontrolled diabetes,  states her blood glucose remains in the 300s and 400s despite she is very compliant with her insulin  regimen.  Patient was admitted for further evaluation.  CT A/P did not show any acute abnormality.  Patient was continued on IV Zofran  and IV Reglan .  Subsequently she has made improvement.  She was able to tolerate food.  Patient tolerated soft bland diet, states she wants to be discharged.  Patient being discharged home.  Advised to continue Keflex  for 3 more days to complete course for UTI.  Discharge Diagnoses:  Principal Problem:   Intractable nausea and vomiting Active Problems:   Diabetic gastroparesis (HCC)   Uncontrolled type 2 diabetes mellitus with hyperglycemia, with long-term current use of insulin  (HCC)  Intractable nausea and vomiting: Diabetic gastroparesis: She had  gastric emptying studying a year ago that showed delayed gastric emptying. CT A/P does not show any intra-abdominal pathology. She has not tolerated po intake for the last 5 days, mild abd tenderness noted on exam Continue IV hydration and antiemetics. Continue Reglan  10 mg 3 times daily, monitor QTc closely Continue IV dilaudid  as needed for pain Started clear liquid diet, advanced as tolerated She tolerated soft bland diet.  Nausea and vomiting improved and resolved.   Acute cystitis: She was diagnosed last week. UA still with infections of infection, but improving Continue IV rocephin  Urine culture multiple species.  Patient discharged on Keflex .  Type 2 diabetes mellitus, uncontrolled with hyperglycemia: Peripheral neuropathy: Last HbA1c 9.8% over 2 weeks ago. CBGs in the 300s on admission At home on humulin  R U-500 80u in morning, 60u at lunch and 75u at night Continue SSI for now with Q4H CBGs Continue gabapentin . Follow-up endocrinology as an outpatient.   Essential HTN: BP elevated with SBP in 140s-150s in the setting of N/V and abd pain. Continue amlodipine  and metoprolol .   Anxiety and depression: Continue duloxetine , BuSpar  and hydroxyzine .   HLD: Continue atorvastatin .   GERD: Continue PPI.   Morbid obesity:  Diet and exercise discussed in detail.  Discharge Instructions  Discharge Instructions     Call MD for:  difficulty breathing, headache or visual disturbances   Complete by: As directed    Call MD for:  persistant dizziness or light-headedness   Complete by: As directed    Call MD for:  persistant nausea and vomiting   Complete by: As directed    Diet - low sodium heart healthy  Complete by: As directed    Diet Carb Modified   Complete by: As directed    Discharge instructions   Complete by: As directed    Advised to follow-up with primary care physician in 1 week. Advised to take Keflex  500 mg twice daily for 3 more days for UTI. Advised to  take Reglan  as needed for intractable nausea and vomiting   Increase activity slowly   Complete by: As directed       Allergies as of 05/02/2024       Reactions   Aspirin Hives   Trulicity  [dulaglutide ] Other (See Comments)   DC'd due to chronic gastric and abdominal pain with nausea   Oxycodone  Nausea And Vomiting        Medication List     STOP taking these medications    Aimovig  140 MG/ML Soaj Generic drug: Erenumab -aooe       TAKE these medications    Accu-Chek Guide test strip Generic drug: glucose blood Use to check blood sugar 3 times daily   Accu-Chek Guide w/Device Kit Use to check blood sugar 3 times daily.   Accu-Chek Softclix Lancets lancets Use to check blood sugar 3 times daily   albuterol  108 (90 Base) MCG/ACT inhaler Commonly known as: VENTOLIN  HFA Inhale 2 puffs into the lungs every 6 (six) hours as needed for wheezing or shortness of breath (Cough).   amLODipine  10 MG tablet Commonly known as: NORVASC  Take 1 tablet (10 mg total) by mouth daily.   atorvastatin  40 MG tablet Commonly known as: LIPITOR Take 40 mg by mouth daily.   Baqsimi  Two Pack 3 MG/DOSE Powd Generic drug: Glucagon  Place 1 Device into the nose as needed (Low blood sugar with impaired consciousness).   Blood Pressure Cuff Misc 1 each by Does not apply route daily. Please provide Large cuff   busPIRone  15 MG tablet Commonly known as: BUSPAR  Take 1 tablet (15 mg total) by mouth 3 (three) times daily.   cyclobenzaprine  10 MG tablet Commonly known as: FLEXERIL  Take 1 tablet (10 mg total) by mouth 3 (three) times daily as needed for muscle spasms.   cycloSPORINE  0.05 % ophthalmic emulsion Commonly known as: Restasis  Apply 1 drop into both eyes twice a day   Dexcom G7 Sensor Misc Change sensor every 10 days.   DULoxetine  60 MG capsule Commonly known as: Cymbalta  Take 1 capsule (60 mg total) by mouth daily. What changed:  when to take this Another medication with  the same name was removed. Continue taking this medication, and follow the directions you see here.   EYE VITAMINS PO Take 1 capsule by mouth daily.   furosemide  20 MG tablet Commonly known as: LASIX  Take 1 tablet (20 mg total) by mouth daily as needed (weight gain). What changed: reasons to take this   gabapentin  300 MG capsule Commonly known as: NEURONTIN  Take 1 capsule (300 mg total) by mouth 3 (three) times daily.   HumuLIN  R U-500 KwikPen 500 UNIT/ML KwikPen Generic drug: insulin  regular human CONCENTRATED Inject 70 Units into the skin daily with breakfast AND 50 Units daily before lunch AND 60 Units daily before supper. Take 30 minutes before meals. What changed: See the new instructions.   hydrOXYzine  50 MG tablet Commonly known as: ATARAX  Take 1 tablet (50 mg total) by mouth 3 (three) times daily.   ibuprofen  800 MG tablet Commonly known as: ADVIL  Take 1 tablet (800 mg total) by mouth 3 (three) times daily. What changed:  when to  take this reasons to take this   Lybalvi  15-10 MG Tabs Generic drug: OLANZapine -Samidorphan Take 15 mg by mouth at bedtime. What changed:  when to take this reasons to take this   metoCLOPramide  10 MG tablet Commonly known as: REGLAN  Take 1 tablet (10 mg total) by mouth 3 (three) times daily before meals.   metoprolol  tartrate 25 MG tablet Commonly known as: LOPRESSOR  Take 1 tablet (25 mg total) by mouth 2 (two) times daily.   omeprazole  40 MG capsule Commonly known as: PRILOSEC TAKE 1 CAPSULE BY MOUTH ONCE DAILY 30 MINUTES BEFORE BREAKFAST   ondansetron  4 MG disintegrating tablet Commonly known as: ZOFRAN -ODT Take 1 tablet (4 mg total) by mouth every 8 (eight) hours as needed for nausea or vomiting.   QUEtiapine  25 MG tablet Commonly known as: SEROquel  Take 1 tablet (25 mg total) by mouth at bedtime as needed. What changed: reasons to take this   SUMAtriptan  100 MG tablet Commonly known as: IMITREX  TAKE 1 TABLET EARLIEST  ONSET OF MIGRAINE. MAY REPEAT IN 2 HOURS IF HEADACHE PERSISTS OR RECURS. MAXIMUM 2 TABLETS IN 24 HOURS.   TechLite Pen Needles 31G X 5 MM Misc Generic drug: Insulin  Pen Needle Use as directed with insulin  pen   traZODone  50 MG tablet Commonly known as: DESYREL  Take 1-2 tablets (50-100 mg total) by mouth at bedtime as needed for sleep.   vitamin B-12 50 MCG tablet Commonly known as: CYANOCOBALAMIN Take 50 mcg by mouth daily.   Vitamin D3 10 MCG (400 UNIT) Caps Take 400 Units by mouth daily.        Follow-up Information     Lawrance Presume, MD Follow up in 1 week(s).   Specialty: Internal Medicine Contact information: [address] [place] [address] [phone]                Allergies  Allergen Reactions   Aspirin Hives   Trulicity  [Dulaglutide ] Other (See Comments)    DC'd due to chronic gastric and abdominal pain with nausea   Oxycodone  Nausea And Vomiting    Consultations: None   Procedures/Studies: CT ABDOMEN PELVIS W CONTRAST Result Date: 04/30/2024 CLINICAL DATA:  Abdominal pain, acute, nonlocalized. EXAM: CT ABDOMEN AND PELVIS WITH CONTRAST TECHNIQUE: Multidetector CT imaging of the abdomen and pelvis was performed using the standard protocol following bolus administration of intravenous contrast. RADIATION DOSE REDUCTION: This exam was performed according to the departmental dose-optimization program which includes automated exposure control, adjustment of the mA and/or kV according to patient size and/or use of iterative reconstruction technique. CONTRAST:  OMNIPAQUE  IOHEXOL  300 MG/ML  SOLN COMPARISON:  CT scan abdomen and pelvis from 12/22/2023. FINDINGS: Lower chest: The lung bases are clear. No pleural effusion. The heart is normal in size. No pericardial effusion. Hepatobiliary: The liver is normal in size. Non-cirrhotic configuration. No suspicious mass. These is mild diffuse hepatic steatosis. Previously seen focal fat within  the central liver, segments 4B/5 is less conspicuous. No intrahepatic or extrahepatic bile duct dilation. Gallbladder is surgically absent. Pancreas: Unremarkable. No pancreatic ductal dilatation or surrounding inflammatory changes. Spleen: Within normal limits. No focal lesion. Adrenals/Urinary Tract: Adrenal glands are unremarkable. No suspicious renal mass. No hydronephrosis. Redemonstration of 2-3 mm nonobstructing calculus in the left kidney lower pole. No other nephroureterolithiasis on either side. Unremarkable urinary bladder. Stomach/Bowel: There is a small sliding hiatal hernia. No disproportionate dilation of the small or large bowel loops. No evidence of abnormal bowel wall thickening or inflammatory  changes. The appendix is unremarkable. Vascular/Lymphatic: No ascites or pneumoperitoneum. No abdominal or pelvic lymphadenopathy, by size criteria. No aneurysmal dilation of the major abdominal arteries. Reproductive: The uterus is surgically absent. No large adnexal mass. Other: There is a tiny fat containing umbilical hernia. The soft tissues and abdominal wall are otherwise unremarkable. Musculoskeletal: No suspicious osseous lesions. There are mild multilevel degenerative changes in the visualized spine. IMPRESSION: *No acute inflammatory process identified within the abdomen or pelvis. *Multiple other nonacute observations, as described above. Electronically Signed   By: Beula Brunswick M.D.   On: 04/30/2024 12:22    Subjective: Patient was seen and examined at bedside. Overnight events noted.   Patient reports doing much better and wants to be discharged.   Nausea and vomiting resolved , She is able to tolerate food.  Discharge Exam: Vitals:   05/02/24 0404 05/02/24 0845  BP: 109/63 125/85  Pulse: 71 69  Resp: 18   Temp: 98.1 F (36.7 C)   SpO2: 95%    Vitals:   05/01/24 1142 05/01/24 2004 05/02/24 0404 05/02/24 0845  BP: 125/78 100/64 109/63 125/85  Pulse: 83 76 71 69  Resp: 20  18 18    Temp: 97.6 F (36.4 C) 98.6 F (37 C) 98.1 F (36.7 C)   TempSrc: Oral Oral Oral   SpO2: 98% 98% 95%   Weight:      Height:        General: Pt is alert, awake, not in acute distress Cardiovascular: RRR, S1/S2 +, no rubs, no gallops Respiratory: CTA bilaterally, no wheezing, no rhonchi Abdominal: Soft, NT, ND, bowel sounds + Extremities: no edema, no cyanosis    The results of significant diagnostics from this hospitalization (including imaging, microbiology, ancillary and laboratory) are listed below for reference.     Microbiology: Recent Results (from the past 240 hours)  Urine Culture     Status: Abnormal   Collection Time: 04/30/24 10:24 AM   Specimen: Urine, Clean Catch  Result Value Ref Range Status   Specimen Description   Final    URINE, CLEAN CATCH Performed at [facility], [address] [place], [place] [address]    Special Requests   Final    NONE Performed at [facility], [address], [place], [place] [address]    Culture MULTIPLE SPECIES PRESENT, SUGGEST RECOLLECTION (A)  Final   Report Status 05/01/2024 FINAL  Final     Labs: BNP (last 3 results) No results for input(s): "BNP" in the last 8760 hours. Basic Metabolic Panel: Recent Labs  Lab 04/28/24 0828 04/30/24 1017 05/01/24 0856  NA 135 137 139  K 3.5 3.7 3.7  CL 97* 98 107  CO2 26 26 24   GLUCOSE 216* 378* 276*  BUN 16 9 8   CREATININE 1.09* 0.95 0.81  CALCIUM  9.6 9.1 8.5*  MG  --   --  2.1  PHOS  --   --  2.8   Liver Function Tests: Recent Labs  Lab 04/28/24 0828 04/30/24 1017  AST 34 34  ALT 24 28  ALKPHOS 126 118  BILITOT 0.9 0.8  PROT 9.0* 8.2*  ALBUMIN 4.4 4.1   Recent Labs  Lab 04/28/24 0828 04/30/24 1017  LIPASE 25 26   No results for input(s): "AMMONIA" in the last 168 hours. CBC: Recent Labs  Lab 04/28/24 0828 04/30/24 1017 05/01/24 0856  WBC 12.4* 10.0 8.8  HGB 14.3 12.7 10.8*  HCT 45.9 39.9 34.4*  MCV  84.2 83.6  85.1  PLT 487* 419* 382   Cardiac Enzymes: No results for input(s): "CKTOTAL", "CKMB", "CKMBINDEX", "TROPONINI" in the last 168 hours. BNP: Invalid input(s): "POCBNP" CBG: Recent Labs  Lab 05/01/24 1644 05/01/24 2006 05/01/24 2358 05/02/24 0405 05/02/24 0748  GLUCAP 215* 265* 322* 245* 246*   D-Dimer No results for input(s): "DDIMER" in the last 72 hours. Hgb A1c Recent Labs    05/01/24 0856  HGBA1C 10.0*   Lipid Profile No results for input(s): "CHOL", "HDL", "LDLCALC", "TRIG", "CHOLHDL", "LDLDIRECT" in the last 72 hours. Thyroid function studies No results for input(s): "TSH", "T4TOTAL", "T3FREE", "THYROIDAB" in the last 72 hours.  Invalid input(s): "FREET3" Anemia work up No results for input(s): "VITAMINB12", "FOLATE", "FERRITIN", "TIBC", "IRON", "RETICCTPCT" in the last 72 hours. Urinalysis    Component Value Date/Time   COLORURINE YELLOW 04/30/2024 1024   APPEARANCEUR HAZY (A) 04/30/2024 1024   LABSPEC 1.029 04/30/2024 1024   PHURINE 6.0 04/30/2024 1024   GLUCOSEU >=500 (A) 04/30/2024 1024   HGBUR NEGATIVE 04/30/2024 1024   BILIRUBINUR NEGATIVE 04/30/2024 1024   BILIRUBINUR negative 06/08/2020 0928   KETONESUR 20 (A) 04/30/2024 1024   PROTEINUR NEGATIVE 04/30/2024 1024   UROBILINOGEN 1.0 01/17/2023 2050   NITRITE NEGATIVE 04/30/2024 1024   LEUKOCYTESUR MODERATE (A) 04/30/2024 1024   Sepsis Labs Recent Labs  Lab 04/28/24 0828 04/30/24 1017 05/01/24 0856  WBC 12.4* 10.0 8.8   Microbiology Recent Results (from the past 240 hours)  Urine Culture     Status: Abnormal   Collection Time: 04/30/24 10:24 AM   Specimen: Urine, Clean Catch  Result Value Ref Range Status   Specimen Description   Final    URINE, CLEAN CATCH Performed at [facility], [address], [place], [place] [address]    Special Requests   Final    NONE Performed at [facility], [address] [place], [place] [address]     Culture MULTIPLE SPECIES PRESENT, SUGGEST RECOLLECTION (A)  Final   Report Status 05/01/2024 FINAL  Final     Time coordinating discharge: Over 30 minutes  SIGNED:   Magdalene School, MD  [facility] 05/02/2024, 11:55 AM Pager   If 7PM-7AM, please contact night-coverage

## 2024-05-03 ENCOUNTER — Telehealth: Payer: Self-pay

## 2024-05-03 NOTE — Transitions of Care (Post Inpatient/ED Visit) (Signed)
   05/03/2024  Name: Whitney Benton MRN: [id number] DOB: [birth year]  Today's TOC FU Call Status: Today's TOC FU Call Status:: Unsuccessful Call (1st Attempt) Unsuccessful Call (1st Attempt) Date: 05/03/24  Attempted to reach the patient regarding the most recent Inpatient/ED visit. Unable to reach patient on phone number provided left a HIPAA approved message on [facility] listed on DPR, requesting a return call.  Follow Up Plan: Additional outreach attempts will be made to reach the patient to complete the Transitions of Care (Post Inpatient/ED visit) call.   Mccuen Cape, RN, BSN, CCM [facility], [facility] RN Care Manager Direct Dial : [phone]

## 2024-05-04 ENCOUNTER — Telehealth: Payer: Self-pay | Admitting: *Deleted

## 2024-05-04 DIAGNOSIS — Z59819 Housing instability, housed unspecified: Secondary | ICD-10-CM

## 2024-05-04 NOTE — Transitions of Care (Post Inpatient/ED Visit) (Signed)
 05/04/2024  Name: Whitney Benton MRN: [id number] DOB: Sep 25, 1971  Today's TOC FU Call Status: Today's TOC FU Call Status:: Successful TOC FU Call Completed TOC FU Call Complete Date: 05/04/24 Patient's Name and Date of Birth confirmed.  Transition Care Management Follow-up Telephone Call Date of Discharge: 05/02/24 Discharge Facility: Maryan Smalling [facility]) Type of Discharge: Inpatient Admission Primary Inpatient Discharge Diagnosis:: Intractable nausea and vomiting How have you been since you were released from the hospital?: Better Any questions or concerns?: No  Items Reviewed: Did you receive and understand the discharge instructions provided?: Yes Medications obtained,verified, and reconciled?: Yes (Medications Reviewed) Any new allergies since your discharge?: No Dietary orders reviewed?: Yes Type of Diet Ordered:: Heart Healthy Do you have support at home?: Yes People in Home [RPT]: child(ren), adult Name of Support/Comfort Primary Source: Son/Jaquan  Medications Reviewed Today: Medications Reviewed Today     Reviewed by Aura Leeds, RN (Registered Nurse) on 05/04/24 at 1326  Med List Status: <None>   Medication Order Taking? Sig Documenting Provider Last Dose Status Informant  Accu-Chek Softclix Lancets lancets [id number] Yes Use to check blood sugar 3 times daily Lawrance Presume, MD Taking Active   albuterol  (VENTOLIN  HFA) 108 (90 Base) MCG/ACT inhaler [id number] No Inhale 2 puffs into the lungs every 6 (six) hours as needed for wheezing or shortness of breath (Cough).  Patient not taking: Reported on 05/04/2024   Lawrance Presume, MD Not Taking Active Self           Med Note (Lynnsie Linders A   Tue May 04, 2024  1:11 PM) Needs refill  amLODipine  (NORVASC ) 10 MG tablet [id number] Yes Take 1 tablet (10 mg total) by mouth daily. Hassie Lint, PA-C Taking Active Self  atorvastatin  (LIPITOR) 40 MG tablet [id number] Yes Take 40 mg by mouth daily. [provider]  Taking Active Self  Blood Glucose Monitoring Suppl (ACCU-CHEK GUIDE) w/Device KIT [id number] Yes Use to check blood sugar 3 times daily. Lawrance Presume, MD Taking Active   Blood Pressure Monitoring (BLOOD PRESSURE CUFF) MISC [id number] Yes 1 each by Does not apply route daily. Please provide Large cuff Gerald Kitty., NP Taking Active   busPIRone  (BUSPAR ) 15 MG tablet [id number] Yes Take 1 tablet (15 mg total) by mouth 3 (three) times daily. Arlyne Bering, NP Taking Active Self  cephALEXin  (KEFLEX ) 500 MG capsule [id number] Yes Take 500 mg by mouth 2 (two) times daily. [provider] Taking Active Self   Patient not taking:   Discontinued 03/03/20 1351 Cholecalciferol  (VITAMIN D3) 10 MCG (400 UNIT) CAPS [id number] Yes Take 400 Units by mouth daily. [provider] Taking Active Self  Continuous Glucose Sensor (DEXCOM G7 SENSOR) MISC [id number] Yes Change sensor every 10 days. Jorge Newcomer, MD Taking Active Self  cyclobenzaprine  (FLEXERIL ) 10 MG tablet [id number] Yes Take 1 tablet (10 mg total) by mouth 3 (three) times daily as needed for muscle spasms. Currieo, Andrew D, MD Taking Active Self  cycloSPORINE  (RESTASIS ) 0.05 % ophthalmic emulsion [id number] Yes Apply 1 drop into both eyes twice a day  Taking Active Self  DULoxetine  (CYMBALTA ) 20 MG capsule [id number] Yes Take 20 mg by mouth daily. [provider] Taking Active Self  DULoxetine  (CYMBALTA ) 60 MG capsule [id number] Yes Take 1 capsule (60 mg total) by mouth daily.  Patient taking differently: Take 60 mg by mouth in the morning.   Arlyne Bering, NP Taking Active Self  Med Note Guido Leeks, Lavonia Powers   Fri Apr 30, 2024 10:28 PM) Patient takes 2 strengths of Cymbalta   furosemide  (LASIX ) 20 MG tablet [id number] Yes Take 1 tablet (20 mg total) by mouth daily as needed (weight gain).  Patient taking differently: Take 20 mg by mouth daily as needed (for swollen feet).   Gerald Kitty., NP Taking Active  Self  gabapentin  (NEURONTIN ) 300 MG capsule [id number] Yes Take 1 capsule (300 mg total) by mouth 3 (three) times daily. Arlyne Bering, NP Taking Active Self  Glucagon  (BAQSIMI  ONE PACK) 3 MG/DOSE POWD [id number] No Place 1 Device into the nose as needed (Low blood sugar with impaired consciousness).  Patient not taking: Reported on 05/04/2024   Jorge Newcomer, MD Not Taking Active Self           Med Note (Jarmel Linhardt A   Tue May 04, 2024  1:14 PM) Has on hand  glucose blood (ACCU-CHEK GUIDE) test strip [id number]  Use to check blood sugar 3 times daily Lawrance Presume, MD  Active   hydrOXYzine  (ATARAX ) 50 MG tablet [id number] Yes Take 1 tablet (50 mg total) by mouth 3 (three) times daily. Arlyne Bering, NP Taking Active Self  ibuprofen  (ADVIL ) 800 MG tablet [id number] Yes Take 1 tablet (800 mg total) by mouth 3 (three) times daily.  Patient taking differently: Take 800 mg by mouth every 8 (eight) hours as needed (for neck pain).   Currieo, Andrew D, MD Taking Active Self  Insulin  Pen Needle (PEN NEEDLES 3/16") 31G X 5 MM MISC [id number] Yes Use as directed with insulin  pen Hassie Lint, PA-C Taking Active   insulin  regular human CONCENTRATED (HUMULIN  R U-500 KWIKPEN) 500 UNIT/ML KwikPen [id number] Yes Inject 70 Units into the skin daily with breakfast AND 50 Units daily before lunch AND 60 Units daily before supper. Take 30 minutes before meals.  Patient taking differently: Inject 80 units into the skin before breakfast, 60 units before lunch, and 70-75 units before supper- 30 minutes before meals   Lawrance Presume, MD Taking Active Self  metoCLOPramide  (REGLAN ) 10 MG tablet [id number] Yes Take 1 tablet (10 mg total) by mouth 3 (three) times daily before meals. Newlin, Enobong, MD Taking Active Self  metoprolol  tartrate (LOPRESSOR ) 25 MG tablet [id number] Yes Take 1 tablet (25 mg total) by mouth 2 (two) times daily. Gerald Kitty., NP Taking Active Self  Multiple  Vitamins-Minerals (EYE VITAMINS PO) [id number] Yes Take 1 capsule by mouth daily. [provider] Taking Active Self  OLANZapine -Samidorphan (LYBALVI ) 15-10 MG TABS [id number] Yes Take 15 mg by mouth at bedtime.  Patient taking differently: Take 15 mg by mouth at bedtime as needed (for sleep).   Arlyne Bering, NP Taking Active Self  omeprazole  (PRILOSEC) 40 MG capsule [id number] Yes TAKE 1 CAPSULE BY MOUTH ONCE DAILY 30 MINUTES BEFORE BREAKFAST Arlee Bellows, NP Taking Active Self  ondansetron  (ZOFRAN -ODT) 4 MG disintegrating tablet [id number] Yes Take 1 tablet (4 mg total) by mouth every 8 (eight) hours as needed for nausea or vomiting. Lawrance Presume, MD Taking Active Self  QUEtiapine  (SEROQUEL ) 25 MG tablet [id number] Yes Take 1 tablet (25 mg total) by mouth at bedtime as needed.  Patient taking differently: Take 25 mg by mouth at bedtime as needed (for sleep).   Arlyne Bering, NP Taking Active Self  SUMAtriptan  (IMITREX ) 100 MG tablet [id number] Yes TAKE 1 TABLET EARLIEST ONSET OF MIGRAINE. MAY REPEAT IN 2 HOURS  IF HEADACHE PERSISTS OR RECURS. MAXIMUM 2 TABLETS IN 24 HOURS. Merriam Abbey, DO Taking Active Self  traZODone  (DESYREL ) 50 MG tablet [id number] Yes Take 1-2 tablets (50-100 mg total) by mouth at bedtime as needed for sleep. Arlyne Bering, NP Taking Active Self  vitamin B-12 (CYANOCOBALAMIN) 50 MCG tablet [id number] Yes Take 50 mcg by mouth daily. [provider] Taking Active Self            Home Care and Equipment/Supplies: Were Home Health Services Ordered?: No Any new equipment or medical supplies ordered?: No  Functional Questionnaire: Do you need assistance with bathing/showering or dressing?: No Do you need assistance with meal preparation?: No Do you need assistance with eating?: No Do you have difficulty maintaining continence: No Do you need assistance with getting out of bed/getting out of a chair/moving?: No Do you have  difficulty managing or taking your medications?: No  Follow up appointments reviewed: PCP Follow-up appointment confirmed?: Yes Date of PCP follow-up appointment?: 05/11/24 Follow-up Provider: Dr. Concetta Dee Specialist [facility] Follow-up appointment confirmed?: NA Do you need transportation to your follow-up appointment?: No Do you understand care options if your condition(s) worsen?: Yes-patient verbalized understanding  SDOH Interventions Today    Flowsheet Row Most Recent Value  SDOH Interventions   Food Insecurity Interventions Other (Comment)  [referral SW]  Housing Interventions Other (Comment)  [Referral to SW]  Transportation Interventions Intervention Not Indicated  Utilities Interventions Intervention Not Indicated      Arna Better RN, BSN [place]  [facility] RN Care Manager [phone]

## 2024-05-05 ENCOUNTER — Other Ambulatory Visit: Payer: Self-pay

## 2024-05-07 ENCOUNTER — Other Ambulatory Visit: Payer: Self-pay

## 2024-05-11 ENCOUNTER — Other Ambulatory Visit: Payer: Self-pay

## 2024-05-11 ENCOUNTER — Ambulatory Visit: Attending: Internal Medicine | Admitting: Internal Medicine

## 2024-05-11 ENCOUNTER — Encounter: Payer: Self-pay | Admitting: Internal Medicine

## 2024-05-11 ENCOUNTER — Telehealth: Payer: Self-pay | Admitting: "Endocrinology

## 2024-05-11 VITALS — BP 111/72 | HR 84 | Temp 98.1°F | Ht 63.0 in | Wt 238.0 lb

## 2024-05-11 DIAGNOSIS — E1065 Type 1 diabetes mellitus with hyperglycemia: Secondary | ICD-10-CM

## 2024-05-11 DIAGNOSIS — E1043 Type 1 diabetes mellitus with diabetic autonomic (poly)neuropathy: Secondary | ICD-10-CM | POA: Diagnosis not present

## 2024-05-11 DIAGNOSIS — Z59819 Housing instability, housed unspecified: Secondary | ICD-10-CM

## 2024-05-11 DIAGNOSIS — K3184 Gastroparesis: Secondary | ICD-10-CM | POA: Diagnosis not present

## 2024-05-11 DIAGNOSIS — Z139 Encounter for screening, unspecified: Secondary | ICD-10-CM

## 2024-05-11 DIAGNOSIS — Z5986 Financial insecurity: Secondary | ICD-10-CM

## 2024-05-11 DIAGNOSIS — Z09 Encounter for follow-up examination after completed treatment for conditions other than malignant neoplasm: Secondary | ICD-10-CM

## 2024-05-11 DIAGNOSIS — Z794 Long term (current) use of insulin: Secondary | ICD-10-CM

## 2024-05-11 DIAGNOSIS — J302 Other seasonal allergic rhinitis: Secondary | ICD-10-CM

## 2024-05-11 DIAGNOSIS — F331 Major depressive disorder, recurrent, moderate: Secondary | ICD-10-CM

## 2024-05-11 DIAGNOSIS — Z5982 Transportation insecurity: Secondary | ICD-10-CM

## 2024-05-11 DIAGNOSIS — E1143 Type 2 diabetes mellitus with diabetic autonomic (poly)neuropathy: Secondary | ICD-10-CM

## 2024-05-11 MED ORDER — LORATADINE 10 MG PO TABS: 10.0000 mg | ORAL_TABLET | Freq: Every day | ORAL | 3 refills | Status: AC | PRN

## 2024-05-11 MED ORDER — FLUTICASONE PROPIONATE 50 MCG/ACT NA SUSP: 1.0000 | Freq: Every day | NASAL | 1 refills | Status: DC | PRN

## 2024-05-11 MED ORDER — METOCLOPRAMIDE HCL 5 MG PO TABS: 5.0000 mg | ORAL_TABLET | Freq: Three times a day (TID) | ORAL | 3 refills | Status: AC | PRN

## 2024-05-11 MED ORDER — ATORVASTATIN CALCIUM 40 MG PO TABS
40.0000 mg | ORAL_TABLET | Freq: Every day | ORAL | 2 refills | Status: AC
  Filled 2024-05-11: qty 90, 90d supply, fill #0
  Filled 2024-08-13: qty 90, 90d supply, fill #1
  Filled 2024-12-06: qty 90, 90d supply, fill #2

## 2024-05-11 MED ORDER — ALBUTEROL SULFATE HFA 108 (90 BASE) MCG/ACT IN AERS: 2.0000 | INHALATION_SPRAY | Freq: Four times a day (QID) | RESPIRATORY_TRACT | 0 refills | Status: AC | PRN

## 2024-05-11 NOTE — Telephone Encounter (Signed)
 MEDICATION:  atorvastatin  atorvastatin  (LIPITOR) 40 MG tablet  PHARMACY:    [facility] (Ph: [phone])    HAS THE PATIENT CONTACTED THEIR PHARMACY?  Yes  IS THIS A 90 DAY SUPPLY : Yes  IS PATIENT OUT OF MEDICATION: No  IF NOT; HOW MUCH IS LEFT: Not sure  LAST APPOINTMENT DATE: @5 /01/2024  NEXT APPOINTMENT DATE:@6 /04/2024  DO WE HAVE YOUR PERMISSION TO LEAVE A DETAILED MESSAGE?:Yes  OTHER COMMENTS:    **Let patient know to contact pharmacy at the end of the day to make sure medication is ready. **  ** Please notify patient to allow 48-72 hours to process**  **Encourage patient to contact the pharmacy for refills or they can request refills through [facility]**

## 2024-05-11 NOTE — Patient Instructions (Signed)
 VISIT SUMMARY:  You had a follow-up appointment today after your recent hospitalization for severe nausea and vomiting. We discussed your diabetic gastroparesis, diabetes management, fatty liver, depression, and allergic rhinitis. Adjustments were made to your medications and lifestyle recommendations were provided.  YOUR PLAN:  -DIABETIC GASTROPARESIS: Diabetic gastroparesis is a condition where your stomach takes too long to empty its contents, causing nausea and vomiting. You will continue taking Reglan  5 mg three times a day with meals. We discussed potential side effects and dietary modifications. Follow up with your gastroenterologist as needed.  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 You will continue your current insulin  regimen and dietary modifications. It's important to replace your continuous glucose monitor. Follow up with your endocrinologist next month.  -FATTY LIVER: Fatty liver is a condition where fat builds up in your liver. To manage this, reduce your carbohydrate intake and engage in regular exercise.  -DEPRESSION: Your depression is being managed with medications, but you are still experiencing sleep difficulties. Continue your current psychiatric medications and follow up with your behavioral health provider next month.  -ALLERGIC RHINITIS: Allergic rhinitis is an allergic reaction that causes sneezing, itchy throat, and headaches. You will use Flonase  nasal spray and can take Claritin as needed.  INSTRUCTIONS:  Please follow up with your gastroenterologist and endocrinologist as discussed. Ensure your continuous glucose monitor is replaced. Continue with your current psychiatric medications and follow up with your behavioral health provider next month.

## 2024-05-11 NOTE — Telephone Encounter (Signed)
 Refill has been sent to the pharmacy downstairs.

## 2024-05-11 NOTE — Progress Notes (Signed)
 Patient ID: Whitney Benton, female    DOB: [birth year]  MRN: [id number]  CC: TOC visit Date of hospitalization: 5/9-11/25 Date of call from RN: 09/04/2024 Diabetes (DM f/u. Med refill /Requesting nasal spray for seasonal allergies /)   Subjective: Whitney Benton is a 53 y.o. female who presents for TOC visit. Her concerns today include:  Pt with hx of depression, migraines (Dr. 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  Started on Norvasc  for possible vasospasms   Discussed the use of AI scribe software for clinical note transcription with the patient, who gave verbal consent to proceed.  History of Present Illness Whitney Benton is a 53 year old female with diabetic gastroparesis who presents for follow-up after recent hospitalization for intractable nausea and vomiting.  She was hospitalized from May 9th to May 11th for intractable nausea, vomiting, and abdominal pain. A CT scan of the abdomen showed no acute findings. She has diabetic gastroparesis, confirmed by a gastric emptying study done about a year ago. During hospitalization, she received intravenous fluids, Reglan , and pain medication.  Since discharge, she is able to hold food down and is taking Reglan  10 mg three times a day before meals. She is eating four small meals daily and adheres to dietary recommendations. Her diabetes management includes insulin  R 500 with doses of 80 units in the morning, 60 units at lunch, and 70-75 units at dinner. Her A1c was 10 during her recent hospitalization. She uses a continuous glucose monitor.  Does not have data to share today as her current sensor has expired and needs to be changed out.  Saw her endocrinologist Dr. Vertell Gory earlier this month and has follow-up appointment next month.  Gastroparesis: She did see the  gastroenterologist, Dr. Elvin Hammer, last month.  I had referred her to see if there are any new medications to help with gastroparesis.  Patient advised of the importance of better diabetes control, eating 4 snack size meals during the day rather than 3 large meals.  He recommended continue Reglan  but changing from 10 mg to 5 mg daily.  He went over with her potential for movement disorder associated with use of Reglan .  MDD: She manages depression with duloxetine  60 mg and 20 mg daily, Seroquel  25 mg at bedtime, and trazodone  50 mg at bedtime. Despite medication, she has difficulty sleeping. Her PHQ score was elevated at 21, and she continues counseling and medication management.   She requires refills on Flonase  nasal spray for allergy symptoms that include sneezing, itchy throat and nasal congestion.      Patient Active Problem List   Diagnosis Date Noted   Uncontrolled type 2 diabetes mellitus with hyperglycemia, with long-term current use of insulin  (HCC) 04/30/2024   Intractable nausea and vomiting 12/23/2023   Diabetic gastroparesis (HCC) 12/22/2023   DKA, type 1 (HCC) 06/20/2023   Essential hypertension 06/20/2023   Vitamin D  deficiency 06/20/2023   Hyperlipidemia 03/06/2023   Primary insomnia 12/27/2022   Gallstone pancreatitis 12/03/2022   Chronic cholecystitis with calculus 12/03/2022   Morbid obesity (HCC) 04/04/2022   Former smoker 04/30/2021   Major depressive disorder, recurrent episode, moderate (HCC) 04/30/2021   Substance induced mood disorder (HCC) 03/22/2021   Generalized anxiety disorder 03/22/2021   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   DKA (diabetic ketoacidosis) (HCC) 01/23/2021   Glaucoma suspect  04/12/2020   DKA, type 2, not at goal [facility]) 10/02/2019   AKI (acute kidney injury) (HCC) 10/02/2019   Hyperkalemia 10/02/2019   Leukocytosis 10/02/2019   Abnormal LFTs 10/02/2019   Stressful life events affecting family and household 08/06/2019   New  onset type 2 diabetes mellitus (HCC) 02/04/2019   Morbid obesity with BMI of 40.0-44.9, adult (HCC) 02/04/2019   Tobacco dependence 02/04/2019   Left arm weakness 12/06/2014   Paresthesias/numbness 12/06/2014   Tobacco abuse 11/14/2014   Depression    Mixed incontinence 06/27/2014   History of TVH in 2006 for fibroids and menorrhagia; benign pathology 09/08/2011     Current Outpatient Medications on File Prior to Visit  Medication Sig Dispense Refill   Accu-Chek Softclix Lancets lancets Use to check blood sugar 3 times daily 100 each 6   amLODipine  (NORVASC ) 10 MG tablet Take 1 tablet (10 mg total) by mouth daily. 90 tablet 2   Blood Glucose Monitoring Suppl (ACCU-CHEK GUIDE) w/Device KIT Use to check blood sugar 3 times daily. 1 kit 0   Blood Pressure Monitoring (BLOOD PRESSURE CUFF) MISC 1 each by Does not apply route daily. Please provide Large cuff 1 each 0   busPIRone  (BUSPAR ) 15 MG tablet Take 1 tablet (15 mg total) by mouth 3 (three) times daily. 90 tablet 3   cephALEXin  (KEFLEX ) 500 MG capsule Take 500 mg by mouth 2 (two) times daily.     Cholecalciferol  (VITAMIN D3) 10 MCG (400 UNIT) CAPS Take 400 Units by mouth daily.     Continuous Glucose Sensor (DEXCOM G7 SENSOR) MISC Change sensor every 10 days. 9 each 3   cyclobenzaprine  (FLEXERIL ) 10 MG tablet Take 1 tablet (10 mg total) by mouth 3 (three) times daily as needed for muscle spasms. 30 tablet 0   cycloSPORINE  (RESTASIS ) 0.05 % ophthalmic emulsion Apply 1 drop into both eyes twice a day 180 each 3   DULoxetine  (CYMBALTA ) 20 MG capsule Take 20 mg by mouth daily.     DULoxetine  (CYMBALTA ) 60 MG capsule Take 1 capsule (60 mg total) by mouth daily. (Patient taking differently: Take 60 mg by mouth in the morning.) 30 capsule 3   furosemide  (LASIX ) 20 MG tablet Take 1 tablet (20 mg total) by mouth daily as needed (weight gain). (Patient taking differently: Take 20 mg by mouth daily as needed (for swollen feet).) 30 tablet 0    gabapentin  (NEURONTIN ) 300 MG capsule Take 1 capsule (300 mg total) by mouth 3 (three) times daily. 90 capsule 3   Glucagon  (BAQSIMI  ONE PACK) 3 MG/DOSE POWD Place 1 Device into the nose as needed (Low blood sugar with impaired consciousness). 2 each 3   glucose blood (ACCU-CHEK GUIDE) test strip Use to check blood sugar 3 times daily 100 each 6   hydrOXYzine  (ATARAX ) 50 MG tablet Take 1 tablet (50 mg total) by mouth 3 (three) times daily. 90 tablet 3   ibuprofen  (ADVIL ) 800 MG tablet Take 1 tablet (800 mg total) by mouth 3 (three) times daily. (Patient taking differently: Take 800 mg by mouth every 8 (eight) hours as needed (for neck pain).) 42 tablet 0   Insulin  Pen Needle (PEN NEEDLES 3/16") 31G X 5 MM MISC Use as directed with insulin  pen 100 each 2   insulin  regular human CONCENTRATED (HUMULIN  R U-500 KWIKPEN) 500 UNIT/ML KwikPen Inject 70 Units into the skin daily with breakfast AND 50 Units daily before lunch AND 60 Units daily before supper. Take 30 minutes before meals. (Patient  taking differently: Inject 80 units into the skin before breakfast, 60 units before lunch, and 70-75 units before supper- 30 minutes before meals) 12 mL 3   metoCLOPramide  (REGLAN ) 10 MG tablet Take 1 tablet (10 mg total) by mouth 3 (three) times daily before meals. 90 tablet 0   metoprolol  tartrate (LOPRESSOR ) 25 MG tablet Take 1 tablet (25 mg total) by mouth 2 (two) times daily. 60 tablet 5   Multiple Vitamins-Minerals (EYE VITAMINS PO) Take 1 capsule by mouth daily.     OLANZapine -Samidorphan (LYBALVI ) 15-10 MG TABS Take 15 mg by mouth at bedtime. (Patient taking differently: Take 15 mg by mouth at bedtime as needed (for sleep).) 30 tablet 3   omeprazole  (PRILOSEC) 40 MG capsule TAKE 1 CAPSULE BY MOUTH ONCE DAILY 30 MINUTES BEFORE BREAKFAST 90 capsule 2   ondansetron  (ZOFRAN -ODT) 4 MG disintegrating tablet Take 1 tablet (4 mg total) by mouth every 8 (eight) hours as needed for nausea or vomiting. 20 tablet 0    QUEtiapine  (SEROQUEL ) 25 MG tablet Take 1 tablet (25 mg total) by mouth at bedtime as needed. (Patient taking differently: Take 25 mg by mouth at bedtime as needed (for sleep).) 30 tablet 3   SUMAtriptan  (IMITREX ) 100 MG tablet TAKE 1 TABLET EARLIEST ONSET OF MIGRAINE. MAY REPEAT IN 2 HOURS IF HEADACHE PERSISTS OR RECURS. MAXIMUM 2 TABLETS IN 24 HOURS. 10 tablet 5   traZODone  (DESYREL ) 50 MG tablet Take 1-2 tablets (50-100 mg total) by mouth at bedtime as needed for sleep. 60 tablet 3   vitamin B-12 (CYANOCOBALAMIN) 50 MCG tablet Take 50 mcg by mouth daily.     [DISCONTINUED] cetirizine  (ZYRTEC ) 10 MG tablet Take 1 tablet (10 mg total) by mouth daily. (Patient not taking: No sig reported) 30 tablet 1   No current facility-administered medications on file prior to visit.    Allergies  Allergen Reactions   Aspirin Hives   Trulicity  [Dulaglutide ] Other (See Comments)    DC'd due to chronic gastric and abdominal pain with nausea   Oxycodone  Nausea And Vomiting    Social History   Socioeconomic History   Marital status: Significant Other    Spouse name: Not on file   Number of children: 3   Years of education: 14   Highest education level: Not on file  Occupational History   Not on file  Tobacco Use   Smoking status: Former    Current packs/day: 0.00    Average packs/day: 0.3 packs/day for 8.0 years (2.0 ttl pk-yrs)    Types: Cigarettes    Start date: 2013    Quit date: 2021    Years since quitting: 4.3   Smokeless tobacco: Never  Vaping Use   Vaping status: Never Used  Substance and Sexual Activity   Alcohol use: Not Currently    Comment: occasional   Drug use: Not Currently    Types: Marijuana    Comment: occ   Sexual activity: Yes    Birth control/protection: Surgical  Other Topics Concern   Not on file  Social History Narrative   Patient lives at home with mother and father , one story    Patient has 3 children    Patient is single   Patient has 14 years of  education    Patient is right handed    Caffeine none   Social Drivers of Health   Financial Resource Strain: Medium Risk (05/11/2024)   Overall Financial Resource Strain (CARDIA)    Difficulty of Paying Living  Expenses: Somewhat hard  Food Insecurity: Food Insecurity Present (05/11/2024)   Hunger Vital Sign    Worried About Running Out of Food in the Last Year: Often true    Ran Out of Food in the Last Year: Often true  Transportation Needs: Unmet Transportation Needs (05/11/2024)   PRAPARE - Administrator, Civil Service (Medical): Yes    Lack of Transportation (Non-Medical): Yes  Physical Activity: Inactive (05/11/2024)   Exercise Vital Sign    Days of Exercise per Week: 0 days    Minutes of Exercise per Session: 0 min  Stress: Stress Concern Present (05/11/2024)   [employer] of Occupational Health - Occupational Stress Questionnaire    Feeling of Stress : Very much  Social Connections: Unknown (05/11/2024)   Social Connection and Isolation Panel [NHANES]    Frequency of Communication with Friends and Family: More than three times a week    Frequency of Social Gatherings with Friends and Family: More than three times a week    Attends Religious Services: More than 4 times per year    Active Member of [employer] or Organizations: Yes    Attends Banker Meetings: 1 to 4 times per year    Marital Status: Patient declined  Intimate Partner Violence: Not At Risk (05/11/2024)   Humiliation, Afraid, Rape, and Kick questionnaire    Fear of Current or Ex-Partner: No    Emotionally Abused: No    Physically Abused: No    Sexually Abused: No    Family History  Problem Relation Age of Onset   Diabetes Mother    Mental illness Mother    Depression Mother    Hypertension Mother    Colon cancer Father 81   Hypertension Father    Diabetes Father    Colon cancer Paternal Grandmother    Esophageal cancer Neg Hx    Stomach cancer Neg Hx    Rectal cancer Neg Hx      Past Surgical History:  Procedure Laterality Date   CESAREAN SECTION     UPPER GASTROINTESTINAL ENDOSCOPY     VAGINAL HYSTERECTOMY  12/23/2004   Fibroids, menorrhagia, benign pathology    ROS: Review of Systems Negative except as stated above  PHYSICAL EXAM: BP 111/72 (BP Location: Left Arm, Patient Position: Sitting, Cuff Size: Large)   Pulse 84   Temp 98.1 F (36.7 C) (Oral)   Ht 5\' 3"  (1.6 m)   Wt 238 lb (108 kg)   SpO2 98%   BMI 42.16 kg/m   Physical Exam  General appearance - alert, well appearing, and in no distress Mental status - normal mood, behavior, speech, dress, motor activity, and thought processes Nose - normal and patent, no erythema, discharge or polyps Neck - supple, no significant adenopathy Chest - clear to auscultation, no wheezes, rales or rhonchi, symmetric air entry Heart - normal rate, regular rhythm, normal S1, S2, no murmurs, rubs, clicks or gallops Abdomen -obese and protruding, soft, nontender, nondistended, no masses or organomegaly Extremities - peripheral pulses normal, no pedal edema, no clubbing or cyanosis      05/11/2024   10:15 AM 03/30/2024   10:16 AM 01/27/2024   12:06 PM  Depression screen PHQ 2/9  Decreased Interest 3    Down, Depressed, Hopeless 3    PHQ - 2 Score 6    Altered sleeping 3    Tired, decreased energy 3    Change in appetite 3    Feeling bad or failure about  yourself  0    Trouble concentrating 3    Moving slowly or fidgety/restless 3    Suicidal thoughts 0    PHQ-9 Score 21    Difficult doing work/chores Extremely dIfficult       Information is confidential and restricted. Go to Review Flowsheets to unlock data.       Latest Ref Rng & Units 05/01/2024    8:56 AM 04/30/2024   10:17 AM 04/28/2024    8:28 AM  CMP  Glucose 70 - 99 mg/dL 161  096  045   BUN 6 - 20 mg/dL 8  9  16    Creatinine 0.44 - 1.00 mg/dL 4.09  8.11  9.14   Sodium 135 - 145 mmol/L 139  137  135   Potassium 3.5 - 5.1 mmol/L 3.7  3.7   3.5   Chloride 98 - 111 mmol/L 107  98  97   CO2 22 - 32 mmol/L 24  26  26    Calcium  8.9 - 10.3 mg/dL 8.5  9.1  9.6   Total Protein 6.5 - 8.1 g/dL  8.2  9.0   Total Bilirubin 0.0 - 1.2 mg/dL  0.8  0.9   Alkaline Phos 38 - 126 U/L  118  126   AST 15 - 41 U/L  34  34   ALT 0 - 44 U/L  28  24    Lipid Panel     Component Value Date/Time   CHOL 169 03/31/2024 0817   CHOL 179 03/23/2024 0934   TRIG 142 03/31/2024 0817   HDL 40 (L) 03/31/2024 0817   HDL 44 03/23/2024 0934   CHOLHDL 4.2 03/31/2024 0817   VLDL 19 11/14/2014 0707   LDLCALC 105 (H) 03/31/2024 0817    CBC    Component Value Date/Time   WBC 8.8 05/01/2024 0856   RBC 4.04 05/01/2024 0856   HGB 10.8 (L) 05/01/2024 0856   HGB 13.3 12/12/2022 1036   HCT 34.4 (L) 05/01/2024 0856   HCT 40.4 12/12/2022 1036   PLT 382 05/01/2024 0856   PLT 436 12/12/2022 1036   MCV 85.1 05/01/2024 0856   MCV 86 12/12/2022 1036   MCH 26.7 05/01/2024 0856   MCHC 31.4 05/01/2024 0856   RDW 15.3 05/01/2024 0856   RDW 12.9 12/12/2022 1036   LYMPHSABS 1.1 06/20/2023 1122   LYMPHSABS 2.4 07/14/2019 1058   MONOABS 0.6 06/20/2023 1122   EOSABS 0.0 06/20/2023 1122   EOSABS 0.1 07/14/2019 1058   BASOSABS 0.0 06/20/2023 1122   BASOSABS 0.0 07/14/2019 1058    ASSESSMENT AND PLAN: 1. Hospital discharge follow-up (Primary)  2. Diabetic gastroparesis (HCC) Nausea, vomiting and abdominal pain have resolved.  We discussed decreasing the dose of Reglan  to 5 mg with meals as needed.  She is agreeable to trying the lower dose.  Advised of potential for movement disorder with consistent long-term use of Reglan .  Gastroenterology input appreciated. - metoCLOPramide  (REGLAN ) 5 MG tablet; Take 1 tablet (5 mg total) by mouth every 8 (eight) hours as needed for nausea. Dose decrease  Dispense: 90 tablet; Refill: 3  3. Uncontrolled type 1 diabetes mellitus with hyperglycemia (HCC) Discussed the importance of better diabetes control through healthy eating  habits and trying to move as much as she can.  She will continue to work closely with her endocrinologist.  She is on our 500 insulin .  4. Major depressive disorder, recurrent episode, moderate (HCC) Patient is plugged in with behavioral health services for counseling  and medication management.  5. Encounter for screening involving social determinants of health (SDoH) - AMB Referral VBCI Care Management  6. Seasonal allergies Continue Flonase .  Will add Claritin. - fluticasone  (FLONASE ) 50 MCG/ACT nasal spray; Place 1 spray into both nostrils daily as needed for allergies or rhinitis.  Dispense: 16 g; Refill: 1 - loratadine (CLARITIN) 10 MG tablet; Take 1 tablet (10 mg total) by mouth daily as needed for allergies.  Dispense: 30 tablet; Refill: 3   Patient was given the opportunity to ask questions.  Patient verbalized understanding of the plan and was able to repeat key elements of the plan.   This documentation was completed using Paediatric nurse.  Any transcriptional errors are unintentional.  Orders Placed This Encounter  Procedures   AMB Referral VBCI Care Management     Requested Prescriptions   Signed Prescriptions Disp Refills   albuterol  (VENTOLIN  HFA) 108 (90 Base) MCG/ACT inhaler 18 g 0    Sig: Inhale 2 puffs into the lungs every 6 (six) hours as needed for wheezing or shortness of breath (Cough).   metoCLOPramide  (REGLAN ) 5 MG tablet 90 tablet 3    Sig: Take 1 tablet (5 mg total) by mouth every 8 (eight) hours as needed for nausea. Dose decrease   fluticasone  (FLONASE ) 50 MCG/ACT nasal spray 16 g 1    Sig: Place 1 spray into both nostrils daily as needed for allergies or rhinitis.   loratadine (CLARITIN) 10 MG tablet 30 tablet 3    Sig: Take 1 tablet (10 mg total) by mouth daily as needed for allergies.    Return in about 4 months (around 09/11/2024).  Concetta Dee, MD, FACP

## 2024-05-12 ENCOUNTER — Other Ambulatory Visit: Payer: Self-pay

## 2024-05-13 ENCOUNTER — Ambulatory Visit: Payer: Medicaid Other | Admitting: Internal Medicine

## 2024-05-14 ENCOUNTER — Telehealth: Payer: Self-pay | Admitting: *Deleted

## 2024-05-14 ENCOUNTER — Other Ambulatory Visit: Payer: Self-pay

## 2024-05-14 ENCOUNTER — Other Ambulatory Visit: Payer: Self-pay | Admitting: Internal Medicine

## 2024-05-14 ENCOUNTER — Telehealth: Payer: Self-pay | Admitting: Internal Medicine

## 2024-05-14 DIAGNOSIS — Z1231 Encounter for screening mammogram for malignant neoplasm of breast: Secondary | ICD-10-CM

## 2024-05-14 NOTE — Progress Notes (Signed)
 Complex Care Management Note  Care Guide Note 05/14/2024 Name: Whitney Benton MRN: [id number] DOB: Mar 13, 1971  Whitney Benton is a 53 y.o. year old female who sees Lawrance Presume, MD for primary care. I reached out to [name] by phone today to offer complex care management services.  Ms. Wetherbee was given information about Complex Care Management services today including:   The Complex Care Management services include support from the care team which includes your Nurse Care Manager, Clinical Social Worker, or Pharmacist.  The Complex Care Management team is here to help remove barriers to the health concerns and goals most important to you. Complex Care Management services are voluntary, and the patient may decline or stop services at any time by request to their care team member.   Complex Care Management Consent Status: Patient agreed to services and verbal consent obtained.   Follow up plan:  Telephone appointment with complex care management team member scheduled for:  05/20/24  Encounter Outcome:  Patient Scheduled  Barnie Bora  [facility]  [facility], [facility] Guide  Direct Dial : [phone]  Fax [phone]

## 2024-05-14 NOTE — Telephone Encounter (Unsigned)
 Copied from CRM [id number]. Topic: Clinical - Medication Refill >> May 14, 2024 11:07 AM DeAngela L wrote: Medication: Insulin  Pen Needle (PEN NEEDLES 3/16") 31G X 5 MM MISC   Has the patient contacted their pharmacy? Yes  (Agent: If no, request that the patient contact the pharmacy for the refill. If patient does not wish to contact the pharmacy document the reason why and proceed with request.) (Agent: If yes, when and what did the pharmacy advise?)  This is the patient's preferred pharmacy:   [facility] [address] [place] [address] Phone: [phone] Fax: [phone]  Is this the correct pharmacy for this prescription? Yes  If no, delete pharmacy and type the correct one.   Has the prescription been filled recently? Yes   Is the patient out of the medication? No but she is running low on needles   Has the patient been seen for an appointment in the last year OR does the patient have an upcoming appointment? Yes   Can we respond through MyChart? Yes   Agent: Please be advised that Rx refills may take up to 3 business days. We ask that you follow-up with your pharmacy.

## 2024-05-14 NOTE — Telephone Encounter (Signed)
 Last Fill: 05/07/23  Last OV: 05/11/24 Next OV: 09/13/24  Routing to provider for review/authorization.

## 2024-05-18 ENCOUNTER — Telehealth: Payer: Self-pay | Admitting: Internal Medicine

## 2024-05-18 MED ORDER — PEN NEEDLES 31G X 5 MM MISC: 6 refills | Status: DC

## 2024-05-18 NOTE — Telephone Encounter (Signed)
 Refills for pen needles sent.

## 2024-05-18 NOTE — Telephone Encounter (Signed)
 Copied from CRM [id number]. Topic: Clinical - Medication Refill >> May 18, 2024  2:14 PM Bearl Botts E wrote: Medication: amLODipine  (NORVASC ) 10 MG tablet  Has the patient contacted their pharmacy? Yes (Agent: If no, request that the patient contact the pharmacy for the refill. If patient does not wish to contact the pharmacy document the reason why and proceed with request.) (Agent: If yes, when and what did the pharmacy advise?)  This is the patient's preferred pharmacy:  [id number] - [place] - [address] [place]) [place] [address] Phone: [phone] Fax: [phone]  Is this the correct pharmacy for this prescription? Yes If no, delete pharmacy and type the correct one.   Has the prescription been filled recently? Yes  Is the patient out of the medication? Yes  Has the patient been seen for an appointment in the last year OR does the patient have an upcoming appointment? Yes  Can we respond through MyChart? Yes  Agent: Please be advised that Rx refills may take up to 3 business days. We ask that you follow-up with your pharmacy.

## 2024-05-20 ENCOUNTER — Other Ambulatory Visit: Payer: Self-pay

## 2024-05-20 MED ORDER — AMLODIPINE BESYLATE 10 MG PO TABS: 10.0000 mg | ORAL_TABLET | Freq: Every day | ORAL | 1 refills | Status: DC

## 2024-05-20 NOTE — Telephone Encounter (Signed)
 Requested Prescriptions  Pending Prescriptions Disp Refills   amLODipine  (NORVASC ) 10 MG tablet 90 tablet 1    Sig: Take 1 tablet (10 mg total) by mouth daily.     Cardiovascular: Calcium  Channel Blockers 2 Passed - 05/20/2024  5:59 PM      Passed - Last BP in normal range    BP Readings from Last 1 Encounters:  05/11/24 111/72         Passed - Last Heart Rate in normal range    Pulse Readings from Last 1 Encounters:  05/11/24 84         Passed - Valid encounter within last 6 months    Recent Outpatient Visits           1 week ago Hospital discharge follow-up   [facility] [place]. [facility] Lawrance Presume, MD   3 months ago Long term (current) use of insulin  [facility])   [facility] [facility] [place]. [facility] Freada Jacobs, [place] L, RPH-CPP   4 months ago Uncontrolled type 1 diabetes mellitus with hyperglycemia [facility])   [facility] [facility] [place]. [facility] Freada Jacobs, [place] L, RPH-CPP   4 months ago Type 1 diabetes mellitus with morbid obesity [facility])   [place] [facility] Dept Of [place]. [facility] Concetta Dee B, MD   6 months ago Type 2 diabetes mellitus with morbid obesity [facility])    [facility] - A Dept Of . [facility] Valente Gaskin, RPH-CPP       Future Appointments             In 3 months Lincoln Renshaw, Rexine Cater, MD [facility] - A Dept Of [place] Fragmin. [facility]   In 3 months Tobb, Kardie, DO [facility] [employer] [facility] [employer]. [facility] [employer], [employer]

## 2024-05-21 ENCOUNTER — Telehealth: Payer: Self-pay | Admitting: *Deleted

## 2024-05-21 NOTE — Progress Notes (Signed)
 Complex Care Management Care Guide Note  05/21/2024 Name: Whitney Benton MRN: [id number] DOB: 1971-05-07  Whitney Benton is a 53 y.o. year old female who is a primary care patient of Lawrance Presume, MD and is actively engaged with the care management team. I reached out to [name] by phone today to assist with re-scheduling  with the BSW.  Follow up plan: Telephone appointment with complex care management team member scheduled for:  05/26/24  Barnie Bora  [facility]  [facility], [facility] Guide  Direct Dial : [phone]  Fax [phone]

## 2024-05-24 ENCOUNTER — Encounter: Payer: Self-pay | Admitting: Internal Medicine

## 2024-05-24 ENCOUNTER — Ambulatory Visit: Admitting: Internal Medicine

## 2024-05-24 ENCOUNTER — Encounter (HOSPITAL_COMMUNITY): Payer: Self-pay

## 2024-05-24 ENCOUNTER — Emergency Department (HOSPITAL_COMMUNITY)
Admission: EM | Admit: 2024-05-24 | Discharge: 2024-05-24 | Disposition: A | Discharge status: Home / Self Care | Attending: Emergency Medicine | Admitting: Emergency Medicine

## 2024-05-24 VITALS — BP 129/89 | HR 104 | Ht 63.0 in | Wt 229.4 lb

## 2024-05-24 DIAGNOSIS — R0602 Shortness of breath: Secondary | ICD-10-CM | POA: Diagnosis not present

## 2024-05-24 DIAGNOSIS — Z794 Long term (current) use of insulin: Secondary | ICD-10-CM | POA: Insufficient documentation

## 2024-05-24 DIAGNOSIS — Z87891 Personal history of nicotine dependence: Secondary | ICD-10-CM

## 2024-05-24 DIAGNOSIS — G4733 Obstructive sleep apnea (adult) (pediatric): Secondary | ICD-10-CM

## 2024-05-24 DIAGNOSIS — G4719 Other hypersomnia: Secondary | ICD-10-CM

## 2024-05-24 DIAGNOSIS — R1010 Upper abdominal pain, unspecified: Secondary | ICD-10-CM | POA: Diagnosis present

## 2024-05-24 DIAGNOSIS — J4540 Moderate persistent asthma, uncomplicated: Secondary | ICD-10-CM

## 2024-05-24 DIAGNOSIS — I10 Essential (primary) hypertension: Secondary | ICD-10-CM

## 2024-05-24 DIAGNOSIS — F12188 Cannabis abuse with other cannabis-induced disorder: Secondary | ICD-10-CM | POA: Diagnosis not present

## 2024-05-24 DIAGNOSIS — E1143 Type 2 diabetes mellitus with diabetic autonomic (poly)neuropathy: Secondary | ICD-10-CM | POA: Diagnosis not present

## 2024-05-24 DIAGNOSIS — E119 Type 2 diabetes mellitus without complications: Secondary | ICD-10-CM | POA: Insufficient documentation

## 2024-05-24 DIAGNOSIS — R112 Nausea with vomiting, unspecified: Secondary | ICD-10-CM

## 2024-05-24 DIAGNOSIS — K3184 Gastroparesis: Secondary | ICD-10-CM | POA: Insufficient documentation

## 2024-05-24 DIAGNOSIS — K219 Gastro-esophageal reflux disease without esophagitis: Secondary | ICD-10-CM | POA: Diagnosis not present

## 2024-05-24 LAB — CBC
HCT: 42 % (ref 36.0–46.0)
Hemoglobin: 13 g/dL (ref 12.0–15.0)
MCH: 25.8 pg — ABNORMAL LOW (ref 26.0–34.0)
MCHC: 31 g/dL (ref 30.0–36.0)
MCV: 83.5 fL (ref 80.0–100.0)
Platelets: 430 10*3/uL — ABNORMAL HIGH (ref 150–400)
RBC: 5.03 MIL/uL (ref 3.87–5.11)
RDW: 15.9 % — ABNORMAL HIGH (ref 11.5–15.5)
WBC: 10.1 10*3/uL (ref 4.0–10.5)
nRBC: 0 % (ref 0.0–0.2)

## 2024-05-24 LAB — COMPREHENSIVE METABOLIC PANEL WITH GFR
ALT: 24 U/L (ref 0–44)
AST: 26 U/L (ref 15–41)
Albumin: 4.2 g/dL (ref 3.5–5.0)
Alkaline Phosphatase: 108 U/L (ref 38–126)
Anion gap: 13 (ref 5–15)
BUN: 13 mg/dL (ref 6–20)
CO2: 23 mmol/L (ref 22–32)
Calcium: 9.5 mg/dL (ref 8.9–10.3)
Chloride: 102 mmol/L (ref 98–111)
Creatinine, Ser: 0.98 mg/dL (ref 0.44–1.00)
GFR, Estimated: >60 mL/min (ref >60)
Glucose, Bld: 328 mg/dL — ABNORMAL HIGH (ref 70–99)
Potassium: 3.6 mmol/L (ref 3.5–5.1)
Sodium: 138 mmol/L (ref 135–145)
Total Bilirubin: 0.9 mg/dL (ref 0.0–1.2)
Total Protein: 7.9 g/dL (ref 6.5–8.1)

## 2024-05-24 LAB — URINALYSIS, ROUTINE W REFLEX MICROSCOPIC
Bacteria, UA: NONE SEEN
Bilirubin Urine: NEGATIVE
Glucose, UA: >=500 mg/dL — AB
Hgb urine dipstick: NEGATIVE
Ketones, ur: 80 mg/dL — AB
Nitrite: NEGATIVE
Protein, ur: 100 mg/dL — AB
Specific Gravity, Urine: 1.029 (ref 1.005–1.030)
pH: 5 (ref 5.0–8.0)

## 2024-05-24 LAB — LIPASE, BLOOD: Lipase: 25 U/L (ref 11–51)

## 2024-05-24 MED ORDER — INSULIN ASPART 100 UNIT/ML IJ SOLN
20.0000 [IU] | Freq: Once | INTRAMUSCULAR | Status: AC
  Administered 2024-05-24: 20 [IU] via SUBCUTANEOUS
  Filled 2024-05-24: qty 0.2

## 2024-05-24 MED ORDER — ONDANSETRON HCL 4 MG/2ML IJ SOLN
4.0000 mg | Freq: Once | INTRAMUSCULAR | Status: AC
  Administered 2024-05-24: 4 mg via INTRAVENOUS
  Filled 2024-05-24: qty 2

## 2024-05-24 MED ORDER — SODIUM CHLORIDE 0.9 % IV BOLUS
1000.0000 mL | Freq: Once | INTRAVENOUS | Status: AC
  Administered 2024-05-24: 1000 mL via INTRAVENOUS

## 2024-05-24 MED ORDER — MOMETASONE FURO-FORMOTEROL FUM 100-5 MCG/ACT IN AERO: 2.0000 | INHALATION_SPRAY | Freq: Two times a day (BID) | RESPIRATORY_TRACT | 5 refills | Status: DC

## 2024-05-24 MED ORDER — HALOPERIDOL LACTATE 5 MG/ML IJ SOLN
2.0000 mg | Freq: Once | INTRAMUSCULAR | Status: AC
  Administered 2024-05-24: 2 mg via INTRAVENOUS
  Filled 2024-05-24: qty 1

## 2024-05-24 MED ORDER — DIPHENHYDRAMINE HCL 50 MG/ML IJ SOLN
25.0000 mg | Freq: Once | INTRAMUSCULAR | Status: AC
  Administered 2024-05-24: 25 mg via INTRAVENOUS
  Filled 2024-05-24: qty 1

## 2024-05-24 NOTE — ED Provider Notes (Signed)
 [facility] AT [facility] Provider Note   CSN: [id number] Arrival date & time: 05/24/24  1031     History {Add pertinent medical, surgical, social history, OB history to HPI:1} Chief Complaint  Patient presents with   Emesis   Abdominal Pain    Whitney Benton is a 53 y.o. female.  HPI This 53 yrs old female with medical history significant for type 2 diabetes, gastroparesis, peripheral neuropathy, arthritis, HLD, anxiety and depression.     Home Medications Prior to Admission medications   Medication Sig Start Date End Date Taking? Authorizing Provider  Accu-Chek Softclix Lancets lancets Use to check blood sugar 3 times daily 08/11/23   Lawrance Presume, MD  albuterol  (VENTOLIN  HFA) 108 (90 Base) MCG/ACT inhaler Inhale 2 puffs into the lungs every 6 (six) hours as needed for wheezing or shortness of breath (Cough). 05/11/24   Lawrance Presume, MD  amLODipine  (NORVASC ) 10 MG tablet Take 1 tablet (10 mg total) by mouth daily. 05/20/24   Lawrance Presume, MD  atorvastatin  (LIPITOR) 40 MG tablet Take 1 tablet (40 mg total) by mouth daily. 05/11/24   Motwani, Komal, MD  Blood Glucose Monitoring Suppl (ACCU-CHEK GUIDE) w/Device KIT Use to check blood sugar 3 times daily. 08/11/23   Lawrance Presume, MD  Blood Pressure Monitoring (BLOOD PRESSURE CUFF) MISC 1 each by Does not apply route daily. Please provide Large cuff 03/23/24   Gerald Kitty., NP  busPIRone  (BUSPAR ) 15 MG tablet Take 1 tablet (15 mg total) by mouth 3 (three) times daily. 03/30/24   Arlyne Bering, NP  cephALEXin  (KEFLEX ) 500 MG capsule Take 500 mg by mouth 2 (two) times daily.    [provider]  Cholecalciferol  (VITAMIN D3) 10 MCG (400 UNIT) CAPS Take 400 Units by mouth daily.    [provider]  Continuous Glucose Sensor (DEXCOM G7 SENSOR) MISC Change sensor every 10 days. 03/03/24   Motwani, Komal, MD  cyclobenzaprine  (FLEXERIL ) 10 MG tablet Take 1 tablet (10 mg  total) by mouth 3 (three) times daily as needed for muscle spasms. 04/07/24   Currieo, Andrew D, MD  cycloSPORINE  (RESTASIS ) 0.05 % ophthalmic emulsion Apply 1 drop into both eyes twice a day 09/19/21     DULoxetine  (CYMBALTA ) 20 MG capsule Take 20 mg by mouth daily.    [provider]  DULoxetine  (CYMBALTA ) 60 MG capsule Take 1 capsule (60 mg total) by mouth daily. Patient taking differently: Take 60 mg by mouth in the morning. 03/30/24   Parsons, Brittney E, NP  fluticasone  (FLONASE ) 50 MCG/ACT nasal spray Place 1 spray into both nostrils daily as needed for allergies or rhinitis. 05/11/24   Lawrance Presume, MD  furosemide  (LASIX ) 20 MG tablet Take 1 tablet (20 mg total) by mouth daily as needed (weight gain). Patient taking differently: Take 20 mg by mouth daily as needed (for swollen feet). 03/23/24   Gerald Kitty., NP  gabapentin  (NEURONTIN ) 300 MG capsule Take 1 capsule (300 mg total) by mouth 3 (three) times daily. 03/30/24   Arlyne Bering, NP  Glucagon  (BAQSIMI  ONE PACK) 3 MG/DOSE POWD Place 1 Device into the nose as needed (Low blood sugar with impaired consciousness). 11/24/23   Motwani, Komal, MD  glucose blood (ACCU-CHEK GUIDE) test strip Use to check blood sugar 3 times daily 08/11/23   Lawrance Presume, MD  hydrOXYzine  (ATARAX ) 50 MG tablet Take 1 tablet (50 mg total) by mouth 3 (  three) times daily. 03/30/24   Arlyne Bering, NP  ibuprofen  (ADVIL ) 800 MG tablet Take 1 tablet (800 mg total) by mouth 3 (three) times daily. Patient taking differently: Take 800 mg by mouth every 8 (eight) hours as needed (for neck pain). 04/07/24   Currieo, Andrew D, MD  Insulin  Pen Needle (PEN NEEDLES) 31G X 5 MM MISC Use to inject insulin  three times daily. 05/18/24   Lawrance Presume, MD  insulin  regular human CONCENTRATED (HUMULIN  R U-500 KWIKPEN) 500 UNIT/ML KwikPen Inject 70 Units into the skin daily with breakfast AND 50 Units daily before lunch AND 60 Units daily before supper. Take  30 minutes before meals. Patient taking differently: Inject 80 units into the skin before breakfast, 60 units before lunch, and 70-75 units before supper- 30 minutes before meals 02/16/24   Lawrance Presume, MD  loratadine  (CLARITIN ) 10 MG tablet Take 1 tablet (10 mg total) by mouth daily as needed for allergies. 05/11/24   Lawrance Presume, MD  metoCLOPramide  (REGLAN ) 10 MG tablet Take 1 tablet (10 mg total) by mouth 3 (three) times daily before meals. 11/14/23   Newlin, Enobong, MD  metoCLOPramide  (REGLAN ) 5 MG tablet Take 1 tablet (5 mg total) by mouth every 8 (eight) hours as needed for nausea. Dose decrease 05/11/24   Lawrance Presume, MD  metoprolol  tartrate (LOPRESSOR ) 25 MG tablet Take 1 tablet (25 mg total) by mouth 2 (two) times daily. 03/23/24   Gerald Kitty., NP  mometasone-formoterol (DULERA) 100-5 MCG/ACT AERO Inhale 2 puffs into the lungs in the morning and at bedtime. 05/24/24   Desai, Nikita S, MD  Multiple Vitamins-Minerals (EYE VITAMINS PO) Take 1 capsule by mouth daily.    [provider]  OLANZapine -Samidorphan (LYBALVI ) 15-10 MG TABS Take 15 mg by mouth at bedtime. Patient taking differently: Take 15 mg by mouth at bedtime as needed (for sleep). 03/30/24   Arlyne Bering, NP  omeprazole  (PRILOSEC) 40 MG capsule TAKE 1 CAPSULE BY MOUTH ONCE DAILY 30 MINUTES BEFORE BREAKFAST 03/10/23   Arlee Bellows, NP  ondansetron  (ZOFRAN -ODT) 4 MG disintegrating tablet Take 1 tablet (4 mg total) by mouth every 8 (eight) hours as needed for nausea or vomiting. 04/29/24   Lawrance Presume, MD  QUEtiapine  (SEROQUEL ) 25 MG tablet Take 1 tablet (25 mg total) by mouth at bedtime as needed. Patient taking differently: Take 25 mg by mouth at bedtime as needed (for sleep). 03/30/24   Arlyne Bering, NP  SUMAtriptan  (IMITREX ) 100 MG tablet TAKE 1 TABLET EARLIEST ONSET OF MIGRAINE. MAY REPEAT IN 2 HOURS IF HEADACHE PERSISTS OR RECURS. MAXIMUM 2 TABLETS IN 24 HOURS. 03/11/24 03/11/25   Merriam Abbey, DO  traZODone  (DESYREL ) 50 MG tablet Take 1-2 tablets (50-100 mg total) by mouth at bedtime as needed for sleep. 03/30/24   Arlyne Bering, NP  vitamin B-12 (CYANOCOBALAMIN) 50 MCG tablet Take 50 mcg by mouth daily.    [provider]  cetirizine  (ZYRTEC ) 10 MG tablet Take 1 tablet (10 mg total) by mouth daily. Patient not taking: No sig reported 03/31/19 03/03/20  Newlin, Enobong, MD      Allergies    Aspirin, Trulicity  [dulaglutide ], and Oxycodone     Review of Systems   Review of Systems  Physical Exam Updated Vital Signs BP (!) 161/115 (BP Location: Right Arm)   Pulse (!) 118   Temp 98.6 F (37 C) (Oral)   Resp 18   SpO2 100%  Physical  Exam  ED Results / Procedures / Treatments   Labs (all labs ordered are listed, but only abnormal results are displayed) Labs Reviewed  CBC - Abnormal; Notable for the following components:      Result Value   MCH 25.8 (*)    RDW 15.9 (*)    Platelets 430 (*)    All other components within normal limits  LIPASE, BLOOD  COMPREHENSIVE METABOLIC PANEL WITH GFR  URINALYSIS, ROUTINE W REFLEX MICROSCOPIC    EKG None  Radiology No results found.  Procedures Procedures  {Document cardiac monitor, telemetry assessment procedure when appropriate:1}  Medications Ordered in ED Medications - No data to display  ED Course/ Medical Decision Making/ A&P   {   Click here for ABCD2, HEART and other calculatorsREFRESH Note before signing :1}                              Medical Decision Making Amount and/or Complexity of Data Reviewed Labs: ordered.   ***  {Document critical care time when appropriate:1} {Document review of labs and clinical decision tools ie heart score, Chads2Vasc2 etc:1}  {Document your independent review of radiology images, and any outside records:1} {Document your discussion with family members, caretakers, and with consultants:1} {Document social determinants of health affecting pt's  care:1} {Document your decision making why or why not admission, treatments were needed:1} Final Clinical Impression(s) / ED Diagnoses Final diagnoses:  None    Rx / DC Orders ED Discharge Orders     None

## 2024-05-24 NOTE — ED Notes (Signed)
 CBG on pt meter 174

## 2024-05-24 NOTE — Progress Notes (Signed)
 Whitney Benton    [id number]    Jun 13, 1971  Primary Care Physician:Johnson, Rexine Cater, MD  Referring Physician: Gerald Kitty., NP [address] [place],  [place] [address] Reason for Consultation: asthma Date of Consultation: 05/24/2024  Chief complaint:   Chief Complaint  Patient presents with   Consult    asthma     HPI:  Discussed the use of AI scribe software for clinical note transcription with the patient, who gave verbal consent to proceed.  History of Present Illness Whitney Benton is a 53 year old female with asthma who presents with shortness of breath and wheezing. She was referred by her cardiologist to ensure her lungs are functioning well.  She has experienced shortness of breath and decreased energy levels for about a year, primarily with exertion, such as walking, and is accompanied by wheezing. She is unable to keep up with activities she used to do, like climbing stairs. No chest tightness or coughing. Exertion worsens her symptoms, and she has not found any alleviating factors.  She was diagnosed with asthma last year at an urgent care visit due to wheezing and coughing. She uses an albuterol  inhaler when wheezing or during hot weather, recently refilled after running out, and previously used it twice daily with beneficial effects.  No history of pneumonia or bronchitis in childhood or adulthood. She takes Flonase  daily for allergies, which is helpful, and Prilosec for acid reflux, which manages her symptoms. No hospitalizations for breathing issues.  She has a history of smoking, having quit in 2021 after smoking half a pack a day since her thirties. She lives with her son and grandchildren and is currently not working, having previously worked in Estate agent. No family history of lung diseases like asthma, COPD, or bronchitis.  She has difficulty sleeping, cannot fall or stay asleep, and sometimes wakes up short of breath. She snores and  takes naps during the day.   Current Regimen: prn albuterol  Asthma Triggers: exertion, heat Exacerbations in the last year: none History of hospitalization or intubation:none Allergy Testing/rhinitis: yes, on flonase  GERD: yes, on prilosec ACT:  Asthma Control Test ACT Total Score  05/24/2024  8:30 AM 13   FeNO: Serum Eos/IgE:   Social history:  Occupation: has worked in Water engineer Exposures: lives at home with son, grandkids Smoking history: former smoker, <10 pack years  Social History   Occupational History   Not on file  Tobacco Use   Smoking status: Former    Current packs/day: 0.00    Average packs/day: 0.5 packs/day for 13.0 years (6.5 ttl pk-yrs)    Types: Cigarettes    Start date: 2008    Quit date: 2021    Years since quitting: 4.4    Passive exposure: Past   Smokeless tobacco: Never  Vaping Use   Vaping status: Never Used  Substance and Sexual Activity   Alcohol use: Not Currently    Comment: occasional   Drug use: Not Currently    Types: Marijuana    Comment: occ   Sexual activity: Yes    Birth control/protection: Surgical    Relevant family history:  Family History  Problem Relation Age of Onset   Diabetes Mother    Mental illness Mother    Depression Mother    Hypertension Mother    Colon cancer Father 72   Hypertension Father    Diabetes Father    Colon cancer Paternal Grandmother  Esophageal cancer Neg Hx    Stomach cancer Neg Hx    Rectal cancer Neg Hx    Asthma Neg Hx     Past Medical History:  Diagnosis Date   Allergy    Anxiety    Arthritis    Asthma    Depression    Diabetes mellitus without complication (HCC)    GERD (gastroesophageal reflux disease)    Glaucoma suspect    Hyperlipidemia    Trichomonas infection     Past Surgical History:  Procedure Laterality Date   CESAREAN SECTION     UPPER GASTROINTESTINAL ENDOSCOPY     VAGINAL HYSTERECTOMY  12/23/2004   Fibroids, menorrhagia, benign pathology      Physical Exam: Blood pressure 129/89, pulse (!) 104, height 5\' 3"  (1.6 m), weight 229 lb 6.4 oz (104.1 kg), SpO2 99%. Gen:      No acute distress, obese ENT:  mallampati II, no nasal polyps, mucus membranes moist Lungs:    No increased respiratory effort, symmetric chest wall excursion, clear to auscultation bilaterally, no wheezes or crackles CV:         Regular rate and rhythm; no murmurs, rubs, or gallops.  No pedal edema Abd:      + bowel sounds; soft, non-tender; no distension MSK: no acute synovitis of DIP or PIP joints, no mechanics hands.  Skin:      Warm and dry; no rashes Neuro: normal speech, no focal facial asymmetry Psych: fatiugued, alert and oriented x3, normal mood and affect   Data Reviewed/Medical Decision Making:  Independent interpretation of tests: Imaging:  Review of patient's chest xray June 2024 images revealed no acute cardiopulmonary process. The patient's images have been independently reviewed by me.    PFTs:  Labs:  Lab Results  Component Value Date   NA 139 05/01/2024   K 3.7 05/01/2024   CO2 24 05/01/2024   GLUCOSE 276 (H) 05/01/2024   BUN 8 05/01/2024   CREATININE 0.81 05/01/2024   CALCIUM  8.5 (L) 05/01/2024   GFR 78.96 10/09/2022   EGFR 79 03/23/2024   GFRNONAA >60 05/01/2024   Lab Results  Component Value Date   WBC 8.8 05/01/2024   HGB 10.8 (L) 05/01/2024   HCT 34.4 (L) 05/01/2024   MCV 85.1 05/01/2024   PLT 382 05/01/2024     Immunization status:  Immunization History  Administered Date(s) Administered   Tdap 07/07/2023     I reviewed prior external note(s) from cardiology, pcp  I reviewed the result(s) of the labs and imaging as noted above.   I have ordered pft, sleep study   Assessment and Plan Assessment & Plan Probable Asthma Shortness of breath Asthma exacerbated by exertion and hot weather.  Discussed maintenance inhaler therapy. - Start Dulera inhaler, two puffs morning and night. Gargle after use to  prevent thrush. - Continue albuterol  inhaler as needed. - Order pulmonary function testing. - dyspnea likely exacerbated by weight gain, untreated osa, deconditioning  Sleep apnea Suspected sleep apnea due to snoring, daytime sleepiness, and nocturnal awakenings. Explained risks and benefits of CPAP therapy. - Order home sleep study.  Acid reflux Reflux managed with Prilosec. Discussed as potential asthma exacerbating factor. - Continue Prilosec. - Advise avoidance of reflux-exacerbating foods.  Hypertension Hypertension potentially affecting overall health.  Tobacco use  <10 pack years.   Quit smoking in 2021.Emphasized importance for lung health.  Seasonal allergic rhinitis - Continue allergy management with Flonase  and Claritin .    Return to Care: Return in  about 2 months (around 07/24/2024).  Louie Rover, MD Pulmonary and Critical Care Medicine [facility] Office:[phone]  CC: Valentina Gasman Karon Packer., NP

## 2024-05-24 NOTE — Progress Notes (Signed)
The patient has been prescribed the inhaler dulera. Inhaler technique was demonstrated to patient. The patient subsequently demonstrated correct technique.  

## 2024-05-24 NOTE — Patient Instructions (Signed)
 It was a pleasure to see you today!  Please schedule follow up with myself in 2 months.  If my schedule is not open yet, we will contact you with a reminder closer to that time. Please call [phone] if you haven't heard from us  a month before, and always call us  sooner if issues or concerns arise. You can also send us  a message through MyChart, but but aware that this is not to be used for urgent issues and it may take up to 5-7 days to receive a reply. Please be aware that you will likely be able to view your results before I have a chance to respond to them. Please give us  5 business days to respond to any non-urgent results.    Before your next visit I would like you to have: Full set of PFTs   VISIT SUMMARY:  Today, you were seen for shortness of breath and wheezing, which have been affecting your daily activities. We discussed your asthma, potential sleep apnea, and acid reflux, and made some adjustments to your treatment plan to help manage these conditions.  YOUR PLAN:  -ASTHMA: Asthma is a condition where your airways narrow and swell, making it difficult to breathe. We are starting you on a Dulera inhaler, which you should use two puffs in the morning and at night, and continue using your albuterol  inhaler as needed. Remember to gargle after using Dulera to prevent thrush. We also ordered pulmonary function testing to better understand your lung function.  -SLEEP APNEA: Sleep apnea is a condition where your breathing repeatedly stops and starts during sleep. We suspect you might have this due to your snoring, daytime sleepiness, and waking up short of breath. We have ordered a home sleep study to confirm this diagnosis.  -ACID REFLUX: Acid reflux occurs when stomach acid flows back into your esophagus, which can worsen asthma symptoms. Continue taking Prilosec as prescribed and try to avoid foods that trigger your reflux.  - SEASONAL ALLERGIES: continue flonase  and  claritin   -TOBACCO USE: You quit smoking in 2021, which is excellent for your lung health. Keep up the good work!  INSTRUCTIONS:  Please schedule a follow-up appointment in 2-3 months after completing the pulmonary function testing and home sleep study. If you have any questions or concerns, feel free to contact us .   By learning about asthma and how it can be controlled, you take an important step toward managing this disease. Work closely with your asthma care team to learn all you can about your asthma, how to avoid triggers, what your medications do, and how to take them correctly. With proper care, you can live free of asthma symptoms and maintain a normal, healthy lifestyle.   What is asthma? Asthma is a chronic disease that affects the airways of the lungs. During normal breathing, the bands of muscle that surround the airways are relaxed and air moves freely. During an asthma episode or "attack," there are three main changes that stop air from moving easily through the airways: The bands of muscle that surround the airways tighten and make the airways narrow. This tightening is called bronchospasm.  The lining of the airways becomes swollen or inflamed.  The cells that line the airways produce more mucus, which is thicker than normal and clogs the airways.  These three factors - bronchospasm, inflammation, and mucus production - cause symptoms such as difficulty breathing, wheezing, and coughing.  What are the most common symptoms of asthma? Asthma symptoms are  not the same for everyone. They can even change from episode to episode in the same person. Also, you may have only one symptom of asthma, such as cough, but another person may have all the symptoms of asthma. It is important to know all the symptoms of asthma and to be aware that your asthma can present in any of these ways at any time. The most common symptoms include: Coughing, especially at night  Shortness of breath   Wheezing  Chest tightness, pain, or pressure   Who is affected by asthma? Asthma affects 22 million Americans; about 6 million of these are children under age 52. People who have a family history of asthma have an increased risk of developing the disease. Asthma is also more common in people who have allergies or who are exposed to tobacco smoke. However, anyone can develop asthma at any time. Some people may have asthma all of their lives, while others may develop it as adults.  What causes asthma? The airways in a person with asthma are very sensitive and react to many things, or "triggers." Contact with these triggers causes asthma symptoms. One of the most important parts of asthma control is to identify your triggers and then avoid them when possible. The only trigger you do not want to avoid is exercise. Pre-treatment with medicines before exercise can allow you to stay active yet avoid asthma symptoms. Common asthma triggers include: Infections (colds, viruses, flu, sinus infections)  Exercise  Weather (changes in temperature and/or humidity, cold air)  Tobacco smoke  Allergens (dust mites, pollens, pets, mold spores, cockroaches, and sometimes foods)  Irritants (strong odors from cleaning products, perfume, wood smoke, air pollution)  Strong emotions such as crying or laughing hard  Some medications   How is asthma diagnosed? To diagnose asthma, your doctor will first review your medical history, family history, and symptoms. Your doctor will want to know any past history of breathing problems you may have had, as well as a family history of asthma, allergies, eczema (a bumpy, itchy skin rash caused by allergies), or other lung disease. It is important that you describe your symptoms in detail (cough, wheeze, shortness of breath, chest tightness), including when and how often they occur. The doctor will perform a physical examination and listen to your heart and lungs. He or she may  also order breathing tests, allergy tests, blood tests, and chest and sinus X-rays. The tests will find out if you do have asthma and if there are any other conditions that are contributing factors.  How is asthma treated? Asthma can be controlled, but not cured. It is not normal to have frequent symptoms, trouble sleeping, or trouble completing tasks. Appropriate asthma care will prevent symptoms and visits to the emergency room and hospital. Asthma medicines are one of the mainstays of asthma treatment. The drugs used to treat asthma are explained below.  Anti-inflammatories: These are the most important drugs for most people with asthma. Anti-inflammatory drugs reduce swelling and mucus production in the airways. As a result, airways are less sensitive and less likely to react to triggers. These medications need to be taken daily and may need to be taken for several weeks before they begin to control asthma. Anti-inflammatory medicines lead to fewer symptoms, better airflow, less sensitive airways, less airway damage, and fewer asthma attacks. If taken every day, they CONTROL or prevent asthma symptoms.   Bronchodilators: These drugs relax the muscle bands that tighten around the airways. This action opens  the airways, letting more air in and out of the lungs and improving breathing. Bronchodilators also help clear mucus from the lungs. As the airways open, the mucus moves more freely and can be coughed out more easily. In short-acting forms, bronchodilators RELIEVE or stop asthma symptoms by quickly opening the airways and are very helpful during an asthma episode. In long-acting forms, bronchodilators provide CONTROL of asthma symptoms and prevent asthma episodes.  Asthma drugs can be taken in a variety of ways. Inhaling the medications by using a metered dose inhaler, dry powder inhaler, or nebulizer is one way of taking asthma medicines. Oral medicines (pills or liquids you swallow) may also be  prescribed.  Asthma severity Asthma is classified as either "intermittent" (comes and goes) or "persistent" (lasting). Persistent asthma is further described as being mild, moderate, or severe. The severity of asthma is based on how often you have symptoms both during the day and night, as well as by the results of lung function tests and by how well you can perform activities. The "severity" of asthma refers to how "intense" or "strong" your asthma is.  Asthma control Asthma control is the goal of asthma treatment. Regardless of your asthma severity, it may or may not be controlled. Asthma control means: You are able to do everything you want to do at work and home  You have no (or minimal) asthma symptoms  You do not wake up from your sleep or earlier than usual in the morning due to asthma  You rarely need to use your reliever medicine (inhaler)  Another major part of your treatment is that you are happy with your asthma care and believe your asthma is controlled.  Monitoring symptoms A key part of treatment is keeping track of how well your lungs are working. Monitoring your symptoms, what they are, how and when they happen, and how severe they are, is an important part of being able to control your asthma.  Sometimes asthma is monitored using a peak flow meter. A peak flow (PF) meter measures how fast the air comes out of your lungs. It can help you know when your asthma is getting worse, sometimes even before you have symptoms. By taking daily peak flow readings, you can learn when to adjust medications to keep asthma under good control. It is also used to create your asthma action plan (see below). Your doctor can use your peak flow readings to adjust your treatment plan in some cases.  Asthma Action Plan Based on your history and asthma severity, you and your doctor will develop a care plan called an "asthma action plan." The asthma action plan describes when and how to use your medicines,  actions to take when asthma worsens, and when to seek emergency care. Make sure you understand this plan. If you do not, ask your asthma care provider any questions you may have. Your asthma action plan is one of the keys to controlling asthma. Keep it readily available to remind you of what you need to do every day to control asthma and what you need to do when symptoms occur.  Goals of asthma therapy These are the goals of asthma treatment: Live an active, normal life  Prevent chronic and troublesome symptoms  Attend work or school every day  Perform daily activities without difficulty  Stop urgent visits to the doctor, emergency department, or hospital  Use and adjust medications to control asthma with few or no side effects

## 2024-05-24 NOTE — ED Notes (Signed)
 CBG on pt meter [phone]

## 2024-05-24 NOTE — ED Triage Notes (Signed)
 Pt arrives via POV. Pt reports since Friday, she has been experiencing upper abdominal pain, nausea, and vomiting. Pt AxOx4.

## 2024-05-25 ENCOUNTER — Ambulatory Visit (INDEPENDENT_AMBULATORY_CARE_PROVIDER_SITE_OTHER): Admitting: Clinical

## 2024-05-25 ENCOUNTER — Ambulatory Visit (HOSPITAL_COMMUNITY): Admitting: Clinical

## 2024-05-25 DIAGNOSIS — F411 Generalized anxiety disorder: Secondary | ICD-10-CM

## 2024-05-25 NOTE — Progress Notes (Signed)
 THERAPIST PROGRESS NOTE Virtual Visit via Video Note  I connected with Whitney Benton on 05/25/2024 at  2:00 PM EDT by a video enabled telemedicine application and verified that I am speaking with the correct person using two identifiers.  Location: Patient: home Provider: office   I discussed the limitations of evaluation and management by telemedicine and the availability of in person appointments. The patient expressed understanding and agreed to proceed.   Follow Up Instructions: I discussed the assessment and treatment plan with the patient. The patient was provided an opportunity to ask questions and all were answered. The patient agreed with the plan and demonstrated an understanding of the instructions.   The patient was advised to call back or seek an in-person evaluation if the symptoms worsen or if the condition fails to improve as anticipated.   Session Time: 40 minutes  Participation Level: Active  Behavioral Response: CasualAlertDepressed  Type of Therapy: Individual Therapy  Treatment Goals addressed: Korrin WILL PARTICIPATE IN AT LEAST 80% OF SCHEDULED INDIVIDUAL PSYCHOTHERAPY SESSIONS   ProgressTowards Goals: Progressing  Interventions: CBT and Supportive  Summary:  Whitney Benton is a 53 y.o. female who presents to the scheduled appointment oriented x 5, appropriately dressed, and friendly.  Client denied hallucinations and delusions. Client reported on today there has been a lot going on.  Client reported she is still staying with her oldest son at his place.  Client reported she has received some backlash for it because others think that she is babying him.  Client reported her son has fallen behind on bills and had to give up his car because he was too far behind on the payments.  Client reported her son is using her car.  Client reported it is a lot of shifting around because she also need her car to watch her 2 grandkids from her youngest son.  Client reported  her youngest son voluntarily decided to fix her car with the ultimatum of not paying her to watch his kids.  Client reported she also has not been to her own home because she has not been consistently feeling well.  Client reported also her sister does not keep the house in the best condition which is not good for her health as well.  Client reported she has also been feeling a bit overwhelmed by a friend that she agreed to help out because the friend broke her ankle.  Client reported it is now becoming very tedious running behind her friend because it is a lot of task that is hard and taxing on her body to do.  Client reported yesterday she was in the emergency room because she was having throwing up episodes related to gastroparesis.  Client reported however she only had 1 emergency room visit related to diabetes at the beginning of this year and now more which is a significant reduction compared to last year.  Client reported she is doing pretty well with managing her blood sugar.  Client reported she is working with a nutritionist but she learned her other 1 that she previously had better. Evidence of progress towards goal: Client reported 1 positive of practicing self-care.   Suicidal/Homicidal: Nowithout intent/plan  Therapist Response:  Therapist began the appointment asking the client how she has been doing since last seen. Therapist engaged with active listening and positive emotional support. Therapist used CBT to ask the client about changes that have occurring with psychosocial stressors, herself and family overall. Therapist used CBT to help  the client describe how she is responding to and managing her stressors. Therapist used CBT to normalize her thoughts and emotions within reason. Therapist used CBT to positively reinforce prioritizing herself and her health as well as appropriately speaking up for herself. Therapist used CBT ask the client to identify her progress with frequency of use  with coping skills with continued practice in her daily activity.    Therapist found client homework to continue practicing self-care habits.   Plan: Return again in 4 weeks.  Diagnosis: Generalized anxiety disorder  Collaboration of Care: Patient refused AEB none requested by the client.  Patient/Guardian was advised Release of Information must be obtained prior to any record release in order to collaborate their care with an outside provider. Patient/Guardian was advised if they have not already done so to contact the registration department to sign all necessary forms in order for us  to release information regarding their care.   Consent: Patient/Guardian gives verbal consent for treatment and assignment of benefits for services provided during this visit. Patient/Guardian expressed understanding and agreed to proceed.   Ajaya Crutchfield Y Lateefah Mallery, LCSW 05/25/2024

## 2024-05-26 ENCOUNTER — Other Ambulatory Visit: Payer: Self-pay

## 2024-05-26 DIAGNOSIS — E1110 Type 2 diabetes mellitus with ketoacidosis without coma: Secondary | ICD-10-CM

## 2024-05-26 NOTE — Patient Instructions (Signed)
 Visit Information  Thank you for taking time to visit with me today. Please don't hesitate to contact me if I can be of assistance to you before our next scheduled appointment.  Our next appointment is by telephone on 06/23/2024 at 11am Please call the care guide team at [phone] if you need to cancel or reschedule your appointment.      Please call the Suicide and Crisis Lifeline: 988 call the [place]  National Suicide Prevention Lifeline: 202-661-3584 or TTY: 409-207-4551 TTY 431-635-0447) to talk to a trained counselor call 1-800-273-TALK (toll free, 24 hour hotline) go to [facility] [address] [place] [phone]) call 911 if you are experiencing a Mental Health or Behavioral Health Crisis or need someone to talk to.  Patient verbalizes understanding of instructions and care plan provided today and agrees to view in MyChart. Active MyChart status and patient understanding of how to access instructions and care plan via MyChart confirmed with patient.     Haven Lion, BSW [place]  [facility] Social Worker, [employer] [facility] [phone]

## 2024-05-26 NOTE — Patient Outreach (Signed)
 Complex Care Management   Visit Note  05/26/2024  Name:  Whitney Benton MRN: [id number] DOB: 1971/03/14  Situation: Referral received for Complex Care Management related to SDOH Barriers:  Housing   Food insecurity Financial Resource Strain I obtained verbal consent from Patient.  Visit completed with patient  on the phone  Background:   Past Medical History:  Diagnosis Date   Allergy    Anxiety    Arthritis    Asthma    Depression    Diabetes mellitus without complication (HCC)    GERD (gastroesophageal reflux disease)    Glaucoma suspect    Hyperlipidemia    Trichomonas infection     Assessment: BSW outreached the patient to assist with SDOH barriers and to schedule patient with RN for CCM.. BSW identified challenges with housing, utilities, food insecurity, and financial strain and provided patient with resources. BSW placed referral with RNCM .  SDOH Interventions Today    Flowsheet Row Most Recent Value  SDOH Interventions   Food Insecurity Interventions Community Resources Provided  Housing Interventions Community Resources Provided  Utilities Interventions Community Resources Provided  Financial Strain Interventions Community Resources Provided        Recommendation:   No recommendations at this time  Follow Up Plan:   Telephone follow-up on 06/23/2024 at 11am  Haven Lion, [place]  [facility] Social Worker, [employer] [phone]

## 2024-05-27 ENCOUNTER — Ambulatory Visit (INDEPENDENT_AMBULATORY_CARE_PROVIDER_SITE_OTHER): Admitting: Psychiatry

## 2024-05-27 ENCOUNTER — Encounter (HOSPITAL_COMMUNITY): Payer: Self-pay | Admitting: Psychiatry

## 2024-05-27 ENCOUNTER — Encounter: Payer: Self-pay | Admitting: "Endocrinology

## 2024-05-27 ENCOUNTER — Ambulatory Visit: Admitting: "Endocrinology

## 2024-05-27 VITALS — BP 130/80 | HR 75 | Ht 63.0 in | Wt 237.0 lb

## 2024-05-27 DIAGNOSIS — F333 Major depressive disorder, recurrent, severe with psychotic symptoms: Secondary | ICD-10-CM

## 2024-05-27 DIAGNOSIS — F411 Generalized anxiety disorder: Secondary | ICD-10-CM | POA: Diagnosis not present

## 2024-05-27 DIAGNOSIS — E7800 Pure hypercholesterolemia, unspecified: Secondary | ICD-10-CM | POA: Diagnosis not present

## 2024-05-27 DIAGNOSIS — E1065 Type 1 diabetes mellitus with hyperglycemia: Secondary | ICD-10-CM

## 2024-05-27 MED ORDER — BUSPIRONE HCL 15 MG PO TABS: 15.0000 mg | ORAL_TABLET | Freq: Three times a day (TID) | ORAL | 3 refills | Status: DC

## 2024-05-27 MED ORDER — GABAPENTIN 300 MG PO CAPS
300.0000 mg | ORAL_CAPSULE | Freq: Three times a day (TID) | ORAL | 3 refills | Status: DC
Start: 2024-05-27 — End: 2024-07-28

## 2024-05-27 MED ORDER — HYDROXYZINE HCL 50 MG PO TABS
50.0000 mg | ORAL_TABLET | Freq: Three times a day (TID) | ORAL | 3 refills | Status: DC
Start: 2024-05-27 — End: 2024-07-28

## 2024-05-27 MED ORDER — QUETIAPINE FUMARATE 25 MG PO TABS: 25.0000 mg | ORAL_TABLET | Freq: Every evening | ORAL | 3 refills | Status: DC | PRN

## 2024-05-27 MED ORDER — DULOXETINE HCL 60 MG PO CPEP: 60.0000 mg | ORAL_CAPSULE | Freq: Every day | ORAL | 3 refills | Status: DC

## 2024-05-27 MED ORDER — TRAZODONE HCL 50 MG PO TABS: 50.0000 mg | ORAL_TABLET | Freq: Every evening | ORAL | 3 refills | Status: DC | PRN

## 2024-05-27 MED ORDER — LYBALVI 15-10 MG PO TABS: 15.0000 mg | ORAL_TABLET | Freq: Every evening | ORAL | 3 refills | Status: DC

## 2024-05-27 MED ORDER — DULOXETINE HCL 20 MG PO CPEP: 20.0000 mg | ORAL_CAPSULE | Freq: Every day | ORAL | 3 refills | Status: DC

## 2024-05-27 NOTE — Patient Instructions (Signed)

## 2024-05-27 NOTE — Progress Notes (Signed)
 BH MD/PA/NP OP Progress Note         05/27/2024 11:53 AM Whitney Benton  MRN:  [id number]  Chief Complaint: "I have been stressed"   HPI: 54 year old female seen today for follow up psychiatric evaluation.  She has a psychiatric history of insomnia, substance induced mood disorder, anxiety and depression.   She is currently managed on Buspar  15 mg three times daily, Lybalvi  15-10 mg nightly, hydroxyzine  25 mg three times daily as needed, gabapentin  300 mg 3 times daily, trazodone  50 to 100 mg nightly as needed, Seroquel  25 mg nightly as needed, and Cymbalta  80 mg daily  Today she notes her medications are somewhat effective in managing her psychiatric condition.   Today was well-groomed, pleasant, cooperative, and engaged in conversation.  She reports that she has not returned to her home with her sister and her nieces/nephews.  She notes that the house was taken out of foreclosure but reports that she only has $20,000 on the mortgage.  She however currently is unemployed and unable to afford it.  Patient notes that her disability continues to be pending as it is going through reconsideration.  Patient informed that recently her son started a new relationship and now wants space from her.  She notes that she has nowhere to go.  She informed Clinical research associate that her boyfriend was living with she and her son but now has left and does not know where this hotel is located.  Patient reports that her younger son is concerned about her health and nagging her.  Physically patient notes that she is overwhelmed.  She reports that her PCP has been scheduled sleep study on 06/03/2024.  Patient notes that her sleep continues to be poor reporting that she wakes up hourly.   Patient reports that the above exacerbates her anxiety and depression.  Today provider conducted a GAD-7 and patient scored a 21, at her last visit she scored 21.  Provider also conducted PHQ-9 and patient scored 21, at her last visit she scored a 23.   She endorsed having adequate appetite.  Today she denies SI/HI/AVH, mania, paranoia.  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   Patient informed Clinical research associate that she discontinued her Lybalvi  as she was uncertain if she was supposed to take it.  Provider asked patient if she would restart it to help manage her mood, anxiety, depression.  She notes that he would.  Today Lybalvi  15-10 mg restarted.  She will continue all other medications as prescribed and follow-up with outpatient counseling. No other concerns noted at this time.   Visit Diagnosis:    ICD-10-CM   1. Severe recurrent major depressive disorder with psychotic features (HCC)  F33.3 QUEtiapine  (SEROQUEL ) 25 MG tablet    gabapentin  (NEURONTIN ) 300 MG capsule    DULoxetine  (CYMBALTA ) 60 MG capsule    traZODone  (DESYREL ) 50 MG tablet    busPIRone  (BUSPAR ) 15 MG tablet    OLANZapine -Samidorphan (LYBALVI ) 15-10 MG TABS    2. Generalized anxiety disorder  F41.1 gabapentin  (NEURONTIN ) 300 MG capsule    hydrOXYzine  (ATARAX ) 50 MG tablet    DULoxetine  (CYMBALTA ) 60 MG capsule    traZODone  (DESYREL ) 50 MG tablet    busPIRone  (BUSPAR ) 15 MG tablet            Past Psychiatric History: cocaine use, marijuana use, anxiety and depression  Past Medical History:  Past Medical History:  Diagnosis Date   Allergy    Anxiety  Arthritis    Asthma    Depression    Diabetes mellitus without complication (HCC)    GERD (gastroesophageal reflux disease)    Glaucoma suspect    Hyperlipidemia    Trichomonas infection     Past Surgical History:  Procedure Laterality Date   CESAREAN SECTION     UPPER GASTROINTESTINAL ENDOSCOPY     VAGINAL HYSTERECTOMY  12/23/2004   Fibroids, menorrhagia, benign pathology    Family Psychiatric History: Mother schizophrenia and bipolar disorder  Family History:  Family History  Problem Relation Age of Onset   Diabetes Mother    Mental illness Mother    Depression  Mother    Hypertension Mother    Colon cancer Father 25   Hypertension Father    Diabetes Father    Colon cancer Paternal Grandmother    Esophageal cancer Neg Hx    Stomach cancer Neg Hx    Rectal cancer Neg Hx    Asthma Neg Hx     Social History:  Social History   Socioeconomic History   Marital status: Significant Other    Spouse name: Not on file   Number of children: 3   Years of education: 14   Highest education level: Not on file  Occupational History   Not on file  Tobacco Use   Smoking status: Former    Current packs/day: 0.00    Average packs/day: 0.5 packs/day for 13.0 years (6.5 ttl pk-yrs)    Types: Cigarettes    Start date: 2008    Quit date: 2021    Years since quitting: 4.4    Passive exposure: Past   Smokeless tobacco: Never  Vaping Use   Vaping status: Never Used  Substance and Sexual Activity   Alcohol use: Not Currently    Comment: occasional   Drug use: Not Currently    Types: Marijuana    Comment: occ   Sexual activity: Yes    Birth control/protection: Surgical  Other Topics Concern   Not on file  Social History Narrative   Patient lives at home with mother and father , one story    Patient has 3 children    Patient is single   Patient has 14 years of education    Patient is right handed    Caffeine none   Social Drivers of Corporate investment banker Strain: High Risk (05/26/2024)   Overall Financial Resource Strain (CARDIA)    Difficulty of Paying Living Expenses: Hard  Food Insecurity: Food Insecurity Present (05/26/2024)   Hunger Vital Sign    Worried About Running Out of Food in the Last Year: Often true    Ran Out of Food in the Last Year: Often true  Transportation Needs: No Transportation Needs (05/26/2024)   PRAPARE - Administrator, Civil Service (Medical): No    Lack of Transportation (Non-Medical): No  Recent Concern: Transportation Needs - Unmet Transportation Needs (05/11/2024)   PRAPARE - Transportation     Lack of Transportation (Medical): Yes    Lack of Transportation (Non-Medical): Yes  Physical Activity: Inactive (05/11/2024)   Exercise Vital Sign    Days of Exercise per Week: 0 days    Minutes of Exercise per Session: 0 min  Stress: Stress Concern Present (05/11/2024)   [employer] of Occupational Health - Occupational Stress Questionnaire    Feeling of Stress : Very much  Social Connections: Unknown (05/11/2024)   Social Connection and Isolation Panel [NHANES]    Frequency of  Communication with Friends and Family: More than three times a week    Frequency of Social Gatherings with Friends and Family: More than three times a week    Attends Religious Services: More than 4 times per year    Active Member of [employer] or Organizations: Yes    Attends Banker Meetings: 1 to 4 times per year    Marital Status: Patient declined    Allergies:  Allergies  Allergen Reactions   Aspirin Hives   Trulicity  [Dulaglutide ] Other (See Comments)    DC'd due to chronic gastric and abdominal pain with nausea   Oxycodone  Nausea And Vomiting    Metabolic Disorder Labs: Lab Results  Component Value Date   HGBA1C 10.0 (H) 05/01/2024   MPG 240.3 05/01/2024   MPG 235 02/18/2024   No results found for: "PROLACTIN" Lab Results  Component Value Date   CHOL 169 03/31/2024   TRIG 142 03/31/2024   HDL 40 (L) 03/31/2024   CHOLHDL 4.2 03/31/2024   VLDL 19 11/14/2014   LDLCALC 105 (H) 03/31/2024   LDLCALC 115 (H) 03/23/2024   Lab Results  Component Value Date   TSH 2.220 03/23/2024   TSH 2.460 04/30/2021    Therapeutic Level Labs: No results found for: "LITHIUM" No results found for: "VALPROATE" No results found for: "CBMZ"  Current Medications: Current Outpatient Medications  Medication Sig Dispense Refill   Accu-Chek Softclix Lancets lancets Use to check blood sugar 3 times daily 100 each 6   albuterol  (VENTOLIN  HFA) 108 (90 Base) MCG/ACT inhaler Inhale 2 puffs into the  lungs every 6 (six) hours as needed for wheezing or shortness of breath (Cough). 18 g 0   amLODipine  (NORVASC ) 10 MG tablet Take 1 tablet (10 mg total) by mouth daily. 90 tablet 1   atorvastatin  (LIPITOR) 40 MG tablet Take 1 tablet (40 mg total) by mouth daily. 90 tablet 2   Blood Glucose Monitoring Suppl (ACCU-CHEK GUIDE) w/Device KIT Use to check blood sugar 3 times daily. 1 kit 0   Blood Pressure Monitoring (BLOOD PRESSURE CUFF) MISC 1 each by Does not apply route daily. Please provide Large cuff 1 each 0   busPIRone  (BUSPAR ) 15 MG tablet Take 1 tablet (15 mg total) by mouth 3 (three) times daily. 90 tablet 3   cephALEXin  (KEFLEX ) 500 MG capsule Take 500 mg by mouth 2 (two) times daily.     Cholecalciferol  (VITAMIN D3) 10 MCG (400 UNIT) CAPS Take 400 Units by mouth daily.     Continuous Glucose Sensor (DEXCOM G7 SENSOR) MISC Change sensor every 10 days. 9 each 3   cyclobenzaprine  (FLEXERIL ) 10 MG tablet Take 1 tablet (10 mg total) by mouth 3 (three) times daily as needed for muscle spasms. 30 tablet 0   cycloSPORINE  (RESTASIS ) 0.05 % ophthalmic emulsion Apply 1 drop into both eyes twice a day 180 each 3   DULoxetine  (CYMBALTA ) 20 MG capsule Take 1 capsule (20 mg total) by mouth daily. 30 capsule 3   DULoxetine  (CYMBALTA ) 60 MG capsule Take 1 capsule (60 mg total) by mouth daily. 30 capsule 3   fluticasone  (FLONASE ) 50 MCG/ACT nasal spray Place 1 spray into both nostrils daily as needed for allergies or rhinitis. 16 g 1   furosemide  (LASIX ) 20 MG tablet Take 1 tablet (20 mg total) by mouth daily as needed (weight gain). (Patient taking differently: Take 20 mg by mouth daily as needed (for swollen feet).) 30 tablet 0   gabapentin  (NEURONTIN ) 300 MG capsule  Take 1 capsule (300 mg total) by mouth 3 (three) times daily. 90 capsule 3   Glucagon  (BAQSIMI  ONE PACK) 3 MG/DOSE POWD Place 1 Device into the nose as needed (Low blood sugar with impaired consciousness). 2 each 3   glucose blood (ACCU-CHEK  GUIDE) test strip Use to check blood sugar 3 times daily 100 each 6   hydrOXYzine  (ATARAX ) 50 MG tablet Take 1 tablet (50 mg total) by mouth 3 (three) times daily. 90 tablet 3   ibuprofen  (ADVIL ) 800 MG tablet Take 1 tablet (800 mg total) by mouth 3 (three) times daily. (Patient taking differently: Take 800 mg by mouth every 8 (eight) hours as needed (for neck pain).) 42 tablet 0   Insulin  Pen Needle (PEN NEEDLES) 31G X 5 MM MISC Use to inject insulin  three times daily. 100 each 6   insulin  regular human CONCENTRATED (HUMULIN  R U-500 KWIKPEN) 500 UNIT/ML KwikPen Inject 70 Units into the skin daily with breakfast AND 50 Units daily before lunch AND 60 Units daily before supper. Take 30 minutes before meals. (Patient taking differently: Inject 80 units into the skin before breakfast, 60 units before lunch, and 70-75 units before supper- 30 minutes before meals) 12 mL 3   loratadine  (CLARITIN ) 10 MG tablet Take 1 tablet (10 mg total) by mouth daily as needed for allergies. 30 tablet 3   metoCLOPramide  (REGLAN ) 10 MG tablet Take 1 tablet (10 mg total) by mouth 3 (three) times daily before meals. 90 tablet 0   metoCLOPramide  (REGLAN ) 5 MG tablet Take 1 tablet (5 mg total) by mouth every 8 (eight) hours as needed for nausea. Dose decrease 90 tablet 3   metoprolol  tartrate (LOPRESSOR ) 25 MG tablet Take 1 tablet (25 mg total) by mouth 2 (two) times daily. 60 tablet 5   mometasone-formoterol (DULERA) 100-5 MCG/ACT AERO Inhale 2 puffs into the lungs in the morning and at bedtime. 1 each 5   Multiple Vitamins-Minerals (EYE VITAMINS PO) Take 1 capsule by mouth daily.     OLANZapine -Samidorphan (LYBALVI ) 15-10 MG TABS Take 15 mg by mouth at bedtime. 30 tablet 3   omeprazole  (PRILOSEC) 40 MG capsule TAKE 1 CAPSULE BY MOUTH ONCE DAILY 30 MINUTES BEFORE BREAKFAST 90 capsule 2   ondansetron  (ZOFRAN -ODT) 4 MG disintegrating tablet Take 1 tablet (4 mg total) by mouth every 8 (eight) hours as needed for nausea or vomiting.  20 tablet 0   QUEtiapine  (SEROQUEL ) 25 MG tablet Take 1 tablet (25 mg total) by mouth at bedtime as needed. 30 tablet 3   SUMAtriptan  (IMITREX ) 100 MG tablet TAKE 1 TABLET EARLIEST ONSET OF MIGRAINE. MAY REPEAT IN 2 HOURS IF HEADACHE PERSISTS OR RECURS. MAXIMUM 2 TABLETS IN 24 HOURS. 10 tablet 5   traZODone  (DESYREL ) 50 MG tablet Take 1-2 tablets (50-100 mg total) by mouth at bedtime as needed for sleep. 60 tablet 3   vitamin B-12 (CYANOCOBALAMIN) 50 MCG tablet Take 50 mcg by mouth daily.     No current facility-administered medications for this visit.     Musculoskeletal: Strength & Muscle Tone: within normal limits Gait & Station: normal Patient leans: N/A  Psychiatric Specialty Exam: Review of Systems  Blood pressure 123/88, pulse 76, temperature 98.9 F (37.2 C), height 5\' 3"  (1.6 m), weight 235 lb 9.6 oz (106.9 kg).Body mass index is 41.73 kg/m.  General Appearance: Well Groomed  Eye Contact:  Good  Speech:  Clear and Coherent  Volume:  Normal  Mood:  Anxious and Depressed  Affect:  Appropriate and Congruent  Thought Process:  Coherent, Goal Directed, and Linear  Orientation:  Full (Time, Place, and Person)  Thought Content: WDL and Logical,   Suicidal Thoughts:  No  Homicidal Thoughts:  No  Memory:  Immediate;   Good Recent;   Good Remote;   Good  Judgement:  Good  Insight:  Good  Psychomotor Activity:  Normal  Concentration:  Concentration: Good and Attention Span: Good  Recall:  Good  Fund of Knowledge: Good  Language: Good  Akathisia:  No  Handed:  Right  AIMS (if indicated):  not done,   Assets:  Communication Skills Desire for Improvement Housing Intimacy Physical Health Social Support  ADL's:  Intact  Cognition: WNL  Sleep:  Poor   Screenings: AIMS    Flowsheet Row Clinical Support from 01/27/2024 in [facility]  AIMS Total Score 0      GAD-7    Flowsheet Row Clinical Support from 05/27/2024 in [facility] Office Visit from 05/11/2024 in [facility] [place] - A Dept Of [place]. [facility] Video Visit from 03/30/2024 in [facility] Clinical Support from 01/27/2024 in [facility] Office Visit from 01/08/2024 in [facility] [place] - A Dept Of [place]. [facility]  Total GAD-7 Score 21 21 21 21 21       PHQ2-9    Flowsheet Row Clinical Support from 05/27/2024 in [facility] Office Visit from 05/11/2024 in [facility] [place] - A Dept Of [place]. [facility] Video Visit from 03/30/2024 in [facility] Clinical Support from 01/27/2024 in [facility] Office Visit from 01/08/2024 in [facility] [place] - A Dept Of [place]. [facility]  PHQ-2 Total Score 6 6 6 6 6   PHQ-9 Total Score 24 21 23 24 21       Flowsheet Row Clinical Support from 05/27/2024 in [facility] ED from 05/24/2024 in [facility] Emergency Department at [facility] ED to Hosp-Admission (Discharged) from 04/30/2024 in [place] [facility]  C-SSRS RISK CATEGORY No Risk No Risk No Risk        Assessment and Plan: Patient notes that her anxiety, depression, and sleep continues to be poor.Patient informed Clinical research associate that she discontinued her Lybalvi  as she was uncertain if she was supposed to take it.  Provider asked patient if she would restart it to help manage her mood, anxiety, depression.  She notes that he would.  Today Lybalvi  15-10 mg restarted.  She will continue all other medications as prescribed and follow-up with outpatient counseling.  1. Severe recurrent major depressive disorder with psychotic features (HCC)  Continue- busPIRone  (BUSPAR ) 15 MG tablet; Take 1 tablet (15 mg total) by mouth 3 (three) times daily.  Dispense: 90 tablet; Refill:  3 Continue- OLANZapine -Samidorphan (LYBALVI ) 15-10 MG TABS; Take 15 mg by mouth at bedtime.  Dispense: 30 tablet; Refill: 3 Continue- DULoxetine  (CYMBALTA ) 20 MG capsule; Take 1 capsule (20 mg total) by mouth every morning.  Dispense: 30 capsule; Refill: 3 Continue- gabapentin  (NEURONTIN ) 300 MG capsule; Take 1 capsule (300 mg total) by mouth 3 (three) times daily.  Dispense: 90 capsule; Refill: 3 Continue- traZODone  (DESYREL ) 50 MG tablet; Take 1-2 tablets (50-100 mg total) by mouth at bedtime as needed for sleep.  Dispense: 60 tablet; Refill: 3 Continue- DULoxetine  (  CYMBALTA ) 60 MG capsule; Take 1 capsule (60 mg total) by mouth daily.  Dispense: 30 capsule; Refill: 3 Continue- QUEtiapine  (SEROQUEL ) 25 MG tablet; Take 1 tablet (25 mg total) by mouth at bedtime as needed.  Dispense: 30 tablet; Refill: 3  2. Generalized anxiety disorder  Continue- busPIRone  (BUSPAR ) 15 MG tablet; Take 1 tablet (15 mg total) by mouth 3 (three) times daily.  Dispense: 90 tablet; Refill: 3 Continue- DULoxetine  (CYMBALTA ) 20 MG capsule; Take 1 capsule (20 mg total) by mouth every morning.  Dispense: 30 capsule; Refill: 3 Continue- gabapentin  (NEURONTIN ) 300 MG capsule; Take 1 capsule (300 mg total) by mouth 3 (three) times daily.  Dispense: 90 capsule; Refill: 3 Continue- hydrOXYzine  (ATARAX ) 50 MG tablet; Take 1 tablet (50 mg total) by mouth 3 (three) times daily.  Dispense: 90 tablet; Refill: 3 Continue- traZODone  (DESYREL ) 50 MG tablet; Take 1-2 tablets (50-100 mg total) by mouth at bedtime as needed for sleep.  Dispense: 60 tablet; Refill: 3 Continue- DULoxetine  (CYMBALTA ) 60 MG capsule; Take 1 capsule (60 mg total) by mouth daily.  Dispense: 30 capsule; Refill: 3      Follow-up in 2.5 months Follow-up with therapy   Arlyne Bering, NP 05/27/2024, 11:53 AM

## 2024-05-27 NOTE — Progress Notes (Signed)
 Outpatient Endocrinology Note Whitney Newcomer, MD  05/27/24   [name] Sep 19, 1971 [id number]  Referring Provider: Lawrance Presume, MD Primary Care Provider: Lawrance Presume, MD Reason for consultation: Subjective   Assessment & Plan  Diagnoses and all orders for this visit:  Uncontrolled type 1 diabetes mellitus with hyperglycemia (HCC)  Pure hypercholesterolemia   Diabetes Type I complicated by hyperglycemia, c-peptide -ve, GAD Ab + Lab Results  Component Value Date   GFR 78.96 10/09/2022   Hba1c goal less than 7, current Hba1c is  Lab Results  Component Value Date   HGBA1C 10.0 (H) 05/01/2024   Will recommend the following: U500 90 units before break fast, 65-70 units before lunch and 75-80 units before supper, 30 min before meals Decrease by 5 units if BG is <70  Has DexCom G7  Patient experiencing  personal life issues-extensively counseled   Stopped Metformin  XR 500 mg bid - max tolerated dose. Patient did not experience any benefit from, only GI S/E  Changed to DexCom G7 from [place] 3 per pt request as Jerrilyn Moras keeps falling off, pt unable to afford skin tac Stopped Toujeo  78 units qam and Humalog  26 units tidac 15 min before meals Was taken off of Trulicity  due to stomach issues  No known contraindications/side effects to any of above medications Glucagon  discussed and prescribed with refills on 11/24/23  -Last LD and Tg are as follows: Lab Results  Component Value Date   LDLCALC 105 (H) 03/31/2024    Lab Results  Component Value Date   TRIG 142 03/31/2024   -On atorvastatin  40 mg every day, LDL was 105 -04/06/24: start rosuvastatin  40 mg every day -Follow low fat diet and exercise   -Blood pressure goal <140/90 - Microalbumin/creatinine goal is < 30 -Last MA/Cr is as follows: Lab Results  Component Value Date   MICROALBUR 0.6 11/17/2023   -not on ACE/ARB  -diet changes including salt restriction -limit eating outside -counseled BP  targets per standards of diabetes care -uncontrolled blood pressure can lead to retinopathy, nephropathy and cardiovascular and atherosclerotic heart disease  Reviewed and counseled on: -A1C target -Blood sugar targets -Complications of uncontrolled diabetes  -Checking blood sugar before meals and bedtime and bring log next visit -All medications with mechanism of action and side effects -Hypoglycemia management: rule of 15's, Glucagon  Emergency Kit and medical alert ID -low-carb low-fat plate-method diet -At least 20 minutes of physical activity per day -Annual dilated retinal eye exam and foot exam -compliance and follow up needs -follow up as scheduled or earlier if problem gets worse  Call if blood sugar is less than 70 or consistently above 250    Take a 15 gm snack of carbohydrate at bedtime before you go to sleep if your blood sugar is less than 100.    If you are going to fast after midnight for a test or procedure, ask your physician for instructions on how to reduce/decrease your insulin  dose.    Call if blood sugar is less than 70 or consistently above 250  -Treating a low sugar by rule of 15  (15 gms of sugar every 15 min until sugar is more than 70) If you feel your sugar is low, test your sugar to be sure If your sugar is low (less than 70), then take 15 grams of a fast acting Carbohydrate (3-4 glucose tablets or glucose gel or 4 ounces of juice or regular soda) Recheck your sugar 15 min after treating low  to make sure it is more than 70 If sugar is still less than 70, treat again with 15 grams of carbohydrate          Don't drive the hour of hypoglycemia  If unconscious/unable to eat or drink by mouth, use glucagon  injection or nasal spray baqsimi  and call 911. Can repeat again in 15 min if still unconscious.  Return in about 3 weeks (around 06/17/2024).   I have reviewed current medications, nurse's notes, allergies, vital signs, past medical and surgical history,  family medical history, and social history for this encounter. Counseled patient on symptoms, examination findings, lab findings, imaging results, treatment decisions and monitoring and prognosis. The patient understood the recommendations and agrees with the treatment plan. All questions regarding treatment plan were fully answered.  Whitney Newcomer, MD  05/27/24    History of Present Illness MALAYZIA Benton is a 53 y.o. year old female who presents for follow up of Type I diabetes mellitus.  Whitney Benton was first diagnosed in 2020.   Diabetes education +  Home diabetes regimen: U500 80 units before break fast, 60 units before lunch and 70-75 units before supper, 30 min before meals  Previously on, Toujeo  72 units qam Humalog  24 units tidac 15 min before meals Metformin  XR 500 mg bid - gets diarrhea   COMPLICATIONS -  MI/Stroke -  retinopathy -  neuropathy -  nephropathy  BLOOD SUGAR DATA  CGM interpretation: At today's visit, we reviewed her CGM downloads. The full report is scanned in the media. Reviewing the CGM trends, BG are elevated all day, specially peaking beginning 9 am.  Physical Exam  BP 130/80   Pulse 75   Ht 5\' 3"  (1.6 m)   Wt 237 lb (107.5 kg)   SpO2 98%   BMI 41.98 kg/m    Constitutional: well developed, well nourished Head: normocephalic, atraumatic Eyes: sclera anicteric, no redness Neck: supple Lungs: normal respiratory effort Neurology: alert and oriented Skin: dry, no appreciable rashes Musculoskeletal: no appreciable defects Psychiatric: normal mood and affect Diabetic Foot Exam - Simple   No data filed      Current Medications Patient's Medications  New Prescriptions   No medications on file  Previous Medications   ACCU-CHEK SOFTCLIX LANCETS LANCETS    Use to check blood sugar 3 times daily   ALBUTEROL  (VENTOLIN  HFA) 108 (90 BASE) MCG/ACT INHALER    Inhale 2 puffs into the lungs every 6 (six) hours as needed for wheezing or shortness  of breath (Cough).   AMLODIPINE  (NORVASC ) 10 MG TABLET    Take 1 tablet (10 mg total) by mouth daily.   ATORVASTATIN  (LIPITOR) 40 MG TABLET    Take 1 tablet (40 mg total) by mouth daily.   BLOOD GLUCOSE MONITORING SUPPL (ACCU-CHEK GUIDE) W/DEVICE KIT    Use to check blood sugar 3 times daily.   BLOOD PRESSURE MONITORING (BLOOD PRESSURE CUFF) MISC    1 each by Does not apply route daily. Please provide Large cuff   BUSPIRONE  (BUSPAR ) 15 MG TABLET    Take 1 tablet (15 mg total) by mouth 3 (three) times daily.   CEPHALEXIN  (KEFLEX ) 500 MG CAPSULE    Take 500 mg by mouth 2 (two) times daily.   CHOLECALCIFEROL  (VITAMIN D3) 10 MCG (400 UNIT) CAPS    Take 400 Units by mouth daily.   CONTINUOUS GLUCOSE SENSOR (DEXCOM G7 SENSOR) MISC    Change sensor every 10 days.   CYCLOBENZAPRINE  (FLEXERIL ) 10 MG  TABLET    Take 1 tablet (10 mg total) by mouth 3 (three) times daily as needed for muscle spasms.   CYCLOSPORINE  (RESTASIS ) 0.05 % OPHTHALMIC EMULSION    Apply 1 drop into both eyes twice a day   DULOXETINE  (CYMBALTA ) 20 MG CAPSULE    Take 20 mg by mouth daily.   DULOXETINE  (CYMBALTA ) 60 MG CAPSULE    Take 1 capsule (60 mg total) by mouth daily.   FLUTICASONE  (FLONASE ) 50 MCG/ACT NASAL SPRAY    Place 1 spray into both nostrils daily as needed for allergies or rhinitis.   FUROSEMIDE  (LASIX ) 20 MG TABLET    Take 1 tablet (20 mg total) by mouth daily as needed (weight gain).   GABAPENTIN  (NEURONTIN ) 300 MG CAPSULE    Take 1 capsule (300 mg total) by mouth 3 (three) times daily.   GLUCAGON  (BAQSIMI  ONE PACK) 3 MG/DOSE POWD    Place 1 Device into the nose as needed (Low blood sugar with impaired consciousness).   GLUCOSE BLOOD (ACCU-CHEK GUIDE) TEST STRIP    Use to check blood sugar 3 times daily   HYDROXYZINE  (ATARAX ) 50 MG TABLET    Take 1 tablet (50 mg total) by mouth 3 (three) times daily.   IBUPROFEN  (ADVIL ) 800 MG TABLET    Take 1 tablet (800 mg total) by mouth 3 (three) times daily.   INSULIN  PEN NEEDLE (PEN  NEEDLES) 31G X 5 MM MISC    Use to inject insulin  three times daily.   INSULIN  REGULAR HUMAN CONCENTRATED (HUMULIN  R U-500 KWIKPEN) 500 UNIT/ML KWIKPEN    Inject 70 Units into the skin daily with breakfast AND 50 Units daily before lunch AND 60 Units daily before supper. Take 30 minutes before meals.   LORATADINE  (CLARITIN ) 10 MG TABLET    Take 1 tablet (10 mg total) by mouth daily as needed for allergies.   METOCLOPRAMIDE  (REGLAN ) 10 MG TABLET    Take 1 tablet (10 mg total) by mouth 3 (three) times daily before meals.   METOCLOPRAMIDE  (REGLAN ) 5 MG TABLET    Take 1 tablet (5 mg total) by mouth every 8 (eight) hours as needed for nausea. Dose decrease   METOPROLOL  TARTRATE (LOPRESSOR ) 25 MG TABLET    Take 1 tablet (25 mg total) by mouth 2 (two) times daily.   MOMETASONE-FORMOTEROL (DULERA) 100-5 MCG/ACT AERO    Inhale 2 puffs into the lungs in the morning and at bedtime.   MULTIPLE VITAMINS-MINERALS (EYE VITAMINS PO)    Take 1 capsule by mouth daily.   OLANZAPINE -SAMIDORPHAN (LYBALVI ) 15-10 MG TABS    Take 15 mg by mouth at bedtime.   OMEPRAZOLE  (PRILOSEC) 40 MG CAPSULE    TAKE 1 CAPSULE BY MOUTH ONCE DAILY 30 MINUTES BEFORE BREAKFAST   ONDANSETRON  (ZOFRAN -ODT) 4 MG DISINTEGRATING TABLET    Take 1 tablet (4 mg total) by mouth every 8 (eight) hours as needed for nausea or vomiting.   QUETIAPINE  (SEROQUEL ) 25 MG TABLET    Take 1 tablet (25 mg total) by mouth at bedtime as needed.   SUMATRIPTAN  (IMITREX ) 100 MG TABLET    TAKE 1 TABLET EARLIEST ONSET OF MIGRAINE. MAY REPEAT IN 2 HOURS IF HEADACHE PERSISTS OR RECURS. MAXIMUM 2 TABLETS IN 24 HOURS.   TRAZODONE  (DESYREL ) 50 MG TABLET    Take 1-2 tablets (50-100 mg total) by mouth at bedtime as needed for sleep.   VITAMIN B-12 (CYANOCOBALAMIN) 50 MCG TABLET    Take 50 mcg by mouth daily.  Modified  Medications   No medications on file  Discontinued Medications   No medications on file    Allergies Allergies  Allergen Reactions   Aspirin Hives    Trulicity  [Dulaglutide ] Other (See Comments)    DC'd due to chronic gastric and abdominal pain with nausea   Oxycodone  Nausea And Vomiting    Past Medical History Past Medical History:  Diagnosis Date   Allergy    Anxiety    Arthritis    Asthma    Depression    Diabetes mellitus without complication (HCC)    GERD (gastroesophageal reflux disease)    Glaucoma suspect    Hyperlipidemia    Trichomonas infection     Past Surgical History Past Surgical History:  Procedure Laterality Date   CESAREAN SECTION     UPPER GASTROINTESTINAL ENDOSCOPY     VAGINAL HYSTERECTOMY  12/23/2004   Fibroids, menorrhagia, benign pathology    Family History family history includes Colon cancer in her paternal grandmother; Colon cancer (age of onset: 77) in her father; Depression in her mother; Diabetes in her father and mother; Hypertension in her father and mother; Mental illness in her mother.  Social History Social History   Socioeconomic History   Marital status: Significant Other    Spouse name: Not on file   Number of children: 3   Years of education: 14   Highest education level: Not on file  Occupational History   Not on file  Tobacco Use   Smoking status: Former    Current packs/day: 0.00    Average packs/day: 0.5 packs/day for 13.0 years (6.5 ttl pk-yrs)    Types: Cigarettes    Start date: 2008    Quit date: 2021    Years since quitting: 4.4    Passive exposure: Past   Smokeless tobacco: Never  Vaping Use   Vaping status: Never Used  Substance and Sexual Activity   Alcohol use: Not Currently    Comment: occasional   Drug use: Not Currently    Types: Marijuana    Comment: occ   Sexual activity: Yes    Birth control/protection: Surgical  Other Topics Concern   Not on file  Social History Narrative   Patient lives at home with mother and father , one story    Patient has 3 children    Patient is single   Patient has 14 years of education    Patient is right handed     Caffeine none   Social Drivers of Corporate investment banker Strain: High Risk (05/26/2024)   Overall Financial Resource Strain (CARDIA)    Difficulty of Paying Living Expenses: Hard  Food Insecurity: Food Insecurity Present (05/26/2024)   Hunger Vital Sign    Worried About Running Out of Food in the Last Year: Often true    Ran Out of Food in the Last Year: Often true  Transportation Needs: No Transportation Needs (05/26/2024)   PRAPARE - Administrator, Civil Service (Medical): No    Lack of Transportation (Non-Medical): No  Recent Concern: Transportation Needs - Unmet Transportation Needs (05/11/2024)   PRAPARE - Transportation    Lack of Transportation (Medical): Yes    Lack of Transportation (Non-Medical): Yes  Physical Activity: Inactive (05/11/2024)   Exercise Vital Sign    Days of Exercise per Week: 0 days    Minutes of Exercise per Session: 0 min  Stress: Stress Concern Present (05/11/2024)   [employer] of Occupational Health - Occupational Stress Questionnaire  Feeling of Stress : Very much  Social Connections: Unknown (05/11/2024)   Social Connection and Isolation Panel [NHANES]    Frequency of Communication with Friends and Family: More than three times a week    Frequency of Social Gatherings with Friends and Family: More than three times a week    Attends Religious Services: More than 4 times per year    Active Member of Clubs or Organizations: Yes    Attends Banker Meetings: 1 to 4 times per year    Marital Status: Patient declined  Intimate Partner Violence: Not At Risk (05/11/2024)   Humiliation, Afraid, Rape, and Kick questionnaire    Fear of Current or Ex-Partner: No    Emotionally Abused: No    Physically Abused: No    Sexually Abused: No    Lab Results  Component Value Date   HGBA1C 10.0 (H) 05/01/2024   HGBA1C 9.8 (H) 02/18/2024   HGBA1C 10.3 (H) 12/23/2023   Lab Results  Component Value Date   CHOL 169 03/31/2024    Lab Results  Component Value Date   HDL 40 (L) 03/31/2024   Lab Results  Component Value Date   LDLCALC 105 (H) 03/31/2024   Lab Results  Component Value Date   TRIG 142 03/31/2024   Lab Results  Component Value Date   CHOLHDL 4.2 03/31/2024   Lab Results  Component Value Date   CREATININE 0.98 05/24/2024   Lab Results  Component Value Date   GFR 78.96 10/09/2022   Lab Results  Component Value Date   MICROALBUR 0.6 11/17/2023      Component Value Date/Time   NA 138 05/24/2024 1057   NA 138 03/23/2024 0934   K 3.6 05/24/2024 1057   CL 102 05/24/2024 1057   CO2 23 05/24/2024 1057   GLUCOSE 328 (H) 05/24/2024 1057   BUN 13 05/24/2024 1057   BUN 10 03/23/2024 0934   CREATININE 0.98 05/24/2024 1057   CREATININE 0.90 11/17/2023 1047   CALCIUM  9.5 05/24/2024 1057   PROT 7.9 05/24/2024 1057   PROT 6.9 03/23/2024 0934   ALBUMIN 4.2 05/24/2024 1057   ALBUMIN 4.3 03/23/2024 0934   AST 26 05/24/2024 1057   ALT 24 05/24/2024 1057   ALKPHOS 108 05/24/2024 1057   BILITOT 0.9 05/24/2024 1057   BILITOT 0.3 03/23/2024 0934   GFRNONAA >60 05/24/2024 1057   GFRAA >60 08/10/2020 2100      Latest Ref Rng & Units 05/24/2024   10:57 AM 05/01/2024    8:56 AM 04/30/2024   10:17 AM  BMP  Glucose 70 - 99 mg/dL 295  621  308   BUN 6 - 20 mg/dL 13  8  9    Creatinine 0.44 - 1.00 mg/dL 6.57  8.46  9.62   Sodium 135 - 145 mmol/L 138  139  137   Potassium 3.5 - 5.1 mmol/L 3.6  3.7  3.7   Chloride 98 - 111 mmol/L 102  107  98   CO2 22 - 32 mmol/L 23  24  26    Calcium  8.9 - 10.3 mg/dL 9.5  8.5  9.1        Component Value Date/Time   WBC 10.1 05/24/2024 1057   RBC 5.03 05/24/2024 1057   HGB 13.0 05/24/2024 1057   HGB 13.3 12/12/2022 1036   HCT 42.0 05/24/2024 1057   HCT 40.4 12/12/2022 1036   PLT 430 (H) 05/24/2024 1057   PLT 436 12/12/2022 1036   MCV 83.5 05/24/2024  1057   MCV 86 12/12/2022 1036   MCH 25.8 (L) 05/24/2024 1057   MCHC 31.0 05/24/2024 1057   RDW 15.9 (H)  05/24/2024 1057   RDW 12.9 12/12/2022 1036   LYMPHSABS 1.1 06/20/2023 1122   LYMPHSABS 2.4 07/14/2019 1058   MONOABS 0.6 06/20/2023 1122   EOSABS 0.0 06/20/2023 1122   EOSABS 0.1 07/14/2019 1058   BASOSABS 0.0 06/20/2023 1122   BASOSABS 0.0 07/14/2019 1058     Parts of this note may have been dictated using voice recognition software. There may be variances in spelling and vocabulary which are unintentional. Not all errors are proofread. Please notify the [facility] if any discrepancies are noted or if the meaning of any statement is not clear.

## 2024-06-03 ENCOUNTER — Ambulatory Visit

## 2024-06-03 ENCOUNTER — Other Ambulatory Visit: Payer: Self-pay

## 2024-06-03 DIAGNOSIS — R0602 Shortness of breath: Secondary | ICD-10-CM

## 2024-06-03 DIAGNOSIS — G4719 Other hypersomnia: Secondary | ICD-10-CM

## 2024-06-10 ENCOUNTER — Encounter: Payer: Self-pay | Admitting: Podiatry

## 2024-06-10 ENCOUNTER — Ambulatory Visit (INDEPENDENT_AMBULATORY_CARE_PROVIDER_SITE_OTHER): Payer: Self-pay | Admitting: Podiatry

## 2024-06-10 DIAGNOSIS — M79674 Pain in right toe(s): Secondary | ICD-10-CM

## 2024-06-10 DIAGNOSIS — E1065 Type 1 diabetes mellitus with hyperglycemia: Secondary | ICD-10-CM

## 2024-06-10 DIAGNOSIS — M79675 Pain in left toe(s): Secondary | ICD-10-CM | POA: Diagnosis not present

## 2024-06-10 DIAGNOSIS — B351 Tinea unguium: Secondary | ICD-10-CM | POA: Diagnosis not present

## 2024-06-10 NOTE — Progress Notes (Signed)
  Subjective:  Patient ID: Whitney Benton, female    DOB: August 12, 1971,  MRN: [id number]  Chief Complaint  Patient presents with   [facility]    Pt stated that she is here to have her nails trimmed down     53 y.o. female presents with the above complaint. History confirmed with patient. Patient presenting with pain related to dystrophic thickened elongated nails. Patient is unable to trim own nails related to nail dystrophy. Patient does have a history of T2DM.  Last A1c 7.4 down from 7.8.  She is wanting to discuss treatment of fungal nails today.  Objective:  Physical Exam: warm, good capillary refill.  Pedal hair growth absent nail exam onychomycosis of the toenails, onycholysis, dystrophic nails, and discoloration of the nail plates noted. DP pulses palpable, PT pulses palpable, protective sensation intact, and vibratory sensation diminished +1 pitting edema bilaterally. Left Foot:  Pain with palpation of nails due to elongation and dystrophic growth.  Right Foot: Pain with palpation of nails due to elongation and dystrophic growth.   Assessment:   1. Uncontrolled type 1 diabetes mellitus with hyperglycemia (HCC)   2. Pain due to onychomycosis of toenails of both feet      Plan:  Patient was evaluated and treated and all questions answered.  #Onychomycosis with pain  -Nails palliatively debrided as below. -Educated on self-care - Patient wants to discuss treatment of fungal toenails.  She may be a better candidate for topical medication due to other medication use, history of abnormal LFTs - Nail specimen sent off for fungal pathology  Procedure: Nail Debridement Rationale: Pain Type of Debridement: manual, sharp debridement. Instrumentation: Nail nipper, rotary burr. Number of Nails: # 10   Return in about 3 months (around 09/10/2024) for Diabetic Foot Care.         Ethan Saddler, DPM [facility] / [facility]

## 2024-06-11 ENCOUNTER — Other Ambulatory Visit: Payer: Self-pay

## 2024-06-16 ENCOUNTER — Other Ambulatory Visit: Payer: Self-pay

## 2024-06-21 ENCOUNTER — Encounter: Payer: Self-pay | Admitting: "Endocrinology

## 2024-06-21 ENCOUNTER — Ambulatory Visit (INDEPENDENT_AMBULATORY_CARE_PROVIDER_SITE_OTHER): Admitting: "Endocrinology

## 2024-06-21 VITALS — BP 124/80 | HR 75 | Ht 63.0 in | Wt 244.0 lb

## 2024-06-21 DIAGNOSIS — E1065 Type 1 diabetes mellitus with hyperglycemia: Secondary | ICD-10-CM | POA: Diagnosis not present

## 2024-06-21 DIAGNOSIS — E7800 Pure hypercholesterolemia, unspecified: Secondary | ICD-10-CM

## 2024-06-21 NOTE — Patient Instructions (Addendum)
 Will recommend the following: U500 85-95 units before break fast, 75-80 units before lunch and 65-70 units before supper, 30 min before meals Decrease by 5 units if blood sugar is <100  Increase by 5 units if blood sugar is <180   _________________    Goals of DM therapy:  Morning Fasting blood sugar: 80-140  Blood sugar before meals: 80-140 Bed time blood sugar: 100-150  A1C <7%, limited only by hypoglycemia  1.Diabetes medications and their side effects discussed, including hypoglycemia    2. Check blood glucose:  a) Always check blood sugars before driving. Please see below (under hypoglycemia) on how to manage b) Check a minimum of 3 times/day or more as needed when having symptoms of hypoglycemia.   c) Try to check blood glucose before sleeping/in the middle of the night to ensure that it is remaining stable and not dropping less than 100 d) Check blood glucose more often if sick  3. Diet: a) 3 meals per day schedule b: Restrict carbs to 60-70 grams (4 servings) per meal c) Colorful vegetables - 3 servings a day, and low sugar fruit 2 servings/day Plate control method: 1/4 plate protein, 1/4 starch, 1/2 green, yellow, or red vegetables d) Avoid carbohydrate snacks unless hypoglycemic episode, or increased physical activity  4. Regular exercise as tolerated, preferably 3 or more hours a week  5. Hypoglycemia: a)  Do not drive or operate machinery without first testing blood glucose to assure it is over 90 mg%, or if dizzy, lightheaded, not feeling normal, etc, or  if foot or leg is numb or weak. b)  If blood glucose less than 70, take four 5gm Glucose tabs or 15-30 gm Glucose gel.  Repeat every 15 min as needed until blood sugar is >100 mg/dl. If hypoglycemia persists then call 911.   6. Sick day management: a) Check blood glucose more often b) Continue usual therapy if blood sugars are elevated.   7. Contact the doctor immediately if blood glucose is frequently <60  mg/dl, or an episode of severe hypoglycemia occurs (where someone had to give you glucose/  glucagon  or if you passed out from a low blood glucose), or if blood glucose is persistently >350 mg/dl, for further management  8. A change in level of physical activity or exercise and a change in diet may also affect your blood sugar. Check blood sugars more often and call if needed.  Instructions: 1. Bring glucose meter, blood glucose records on every visit for review 2. Continue to follow up with primary care physician and other providers for medical care 3. Yearly eye  and foot exam 4. Please get blood work done prior to the next appointment

## 2024-06-21 NOTE — Progress Notes (Signed)
 Outpatient Endocrinology Note Whitney Birmingham, MD  06/21/24   Whitney Benton 1971-04-26 [id number]  Referring Provider: [name], MD Primary Care Provider: [name], MD Reason for consultation: Subjective   Assessment & Plan  Diagnoses and all orders for this visit:  Uncontrolled type 1 diabetes mellitus with hyperglycemia (HCC)  Pure hypercholesterolemia    Diabetes Type I complicated by hyperglycemia, c-peptide -ve, GAD Ab + Lab Results  Component Value Date   GFR 78.96 10/09/2022   Hba1c goal less than 7, current Hba1c is  Lab Results  Component Value Date   HGBA1C 10.0 (H) 05/01/2024   Will recommend the following: U500 85-95 units before break fast, 75-80 units before lunch and 65-70 units before supper, 30 min before meals Decrease by 5 units if BG is <70  Has DexCom G7  Patient experiencing  personal life issues-extensively counseled   Stopped Metformin  XR 500 mg bid - max tolerated dose. Patient did not experience any benefit from, only GI S/E  Changed to DexCom G7 from [place] 3 per pt request as herlene keeps falling off, pt unable to afford skin tac Stopped Toujeo  78 units qam and Humalog  26 units tidac 15 min before meals Was taken off of Trulicity  due to stomach issues  No known contraindications/side effects to any of above medications Glucagon  discussed and prescribed with refills on 11/24/23  -Last LD and Tg are as follows: Lab Results  Component Value Date   LDLCALC 105 (H) 03/31/2024    Lab Results  Component Value Date   TRIG 142 03/31/2024   -On atorvastatin  40 mg every day, LDL was 105 -04/06/24: started rosuvastatin  40 mg every day -Follow low fat diet and exercise   -Blood pressure goal <140/90 - Microalbumin/creatinine goal is < 30 -Last MA/Cr is as follows: Lab Results  Component Value Date   MICROALBUR 0.6 11/17/2023   -not on ACE/ARB  -diet changes including salt restriction -limit eating  outside -counseled BP targets per standards of diabetes care -uncontrolled blood pressure can lead to retinopathy, nephropathy and cardiovascular and atherosclerotic heart disease  Reviewed and counseled on: -A1C target -Blood sugar targets -Complications of uncontrolled diabetes  -Checking blood sugar before meals and bedtime and bring log next visit -All medications with mechanism of action and side effects -Hypoglycemia management: rule of 15's, Glucagon  Emergency Kit and medical alert ID -low-carb low-fat plate-method diet -At least 20 minutes of physical activity per day -Annual dilated retinal eye exam and foot exam -compliance and follow up needs -follow up as scheduled or earlier if problem gets worse  Call if blood sugar is less than 70 or consistently above 250    Take a 15 gm snack of carbohydrate at bedtime before you go to sleep if your blood sugar is less than 100.    If you are going to fast after midnight for a test or procedure, ask your physician for instructions on how to reduce/decrease your insulin  dose.    Call if blood sugar is less than 70 or consistently above 250  -Treating a low sugar by rule of 15  (15 gms of sugar every 15 min until sugar is more than 70) If you feel your sugar is low, test your sugar to be sure If your sugar is low (less than 70), then take 15 grams of a fast acting Carbohydrate (3-4 glucose tablets or glucose gel or 4 ounces of juice or regular soda) Recheck your sugar 15 min after treating  low to make sure it is more than 70 If sugar is still less than 70, treat again with 15 grams of carbohydrate          Don't drive the hour of hypoglycemia  If unconscious/unable to eat or drink by mouth, use glucagon  injection or nasal spray baqsimi  and call 911. Can repeat again in 15 min if still unconscious.  No follow-ups on file.   I have reviewed current medications, nurse's notes, allergies, vital signs, past medical and surgical history,  family medical history, and social history for this encounter. Counseled patient on symptoms, examination findings, lab findings, imaging results, treatment decisions and monitoring and prognosis. The patient understood the recommendations and agrees with the treatment plan. All questions regarding treatment plan were fully answered.  Whitney Birmingham, MD  06/21/24    History of Present Illness Whitney Benton is a 53 y.o. year old female who presents for follow up of Type I diabetes mellitus.  Whitney Benton was first diagnosed in 2020.   Diabetes education +  Home diabetes regimen: U500 80-90 units before break fast, 65-70 units before lunch and 75-80 units before supper, 30 min before meals  Previously on, Toujeo  72 units qam Humalog  24 units tidac 15 min before meals Metformin  XR 500 mg bid - gets diarrhea   COMPLICATIONS -  MI/Stroke -  retinopathy -  neuropathy -  nephropathy  BLOOD SUGAR DATA CGM interpretation: At today's visit, we reviewed her CGM downloads. The full report is scanned in the media. Reviewing the CGM trends, BG are elevated between 9am-3pm and lows at midnight-overnight.  Physical Exam  BP 124/80   Pulse 75   Ht 5' 3 (1.6 m)   Wt 244 lb (110.7 kg)   SpO2 98%   BMI 43.22 kg/m    Constitutional: well developed, well nourished Head: normocephalic, atraumatic Eyes: sclera anicteric, no redness Neck: supple Lungs: normal respiratory effort Neurology: alert and oriented Skin: dry, no appreciable rashes Musculoskeletal: no appreciable defects Psychiatric: normal mood and affect Diabetic Foot Exam - Simple   No data filed      Current Medications Patient's Medications  New Prescriptions   No medications on file  Previous Medications   ACCU-CHEK SOFTCLIX LANCETS LANCETS    Use to check blood sugar 3 times daily   ALBUTEROL  (VENTOLIN  HFA) 108 (90 BASE) MCG/ACT INHALER    Inhale 2 puffs into the lungs every 6 (six) hours as needed for wheezing or  shortness of breath (Cough).   AMLODIPINE  (NORVASC ) 10 MG TABLET    Take 1 tablet (10 mg total) by mouth daily.   ATORVASTATIN  (LIPITOR) 40 MG TABLET    Take 1 tablet (40 mg total) by mouth daily.   BLOOD GLUCOSE MONITORING SUPPL (ACCU-CHEK GUIDE) W/DEVICE KIT    Use to check blood sugar 3 times daily.   BLOOD PRESSURE MONITORING (BLOOD PRESSURE CUFF) MISC    1 each by Does not apply route daily. Please provide Large cuff   BUSPIRONE  (BUSPAR ) 15 MG TABLET    Take 1 tablet (15 mg total) by mouth 3 (three) times daily.   CEPHALEXIN  (KEFLEX ) 500 MG CAPSULE    Take 500 mg by mouth 2 (two) times daily.   CHOLECALCIFEROL  (VITAMIN D3) 10 MCG (400 UNIT) CAPS    Take 400 Units by mouth daily.   CONTINUOUS GLUCOSE SENSOR (DEXCOM G7 SENSOR) MISC    Change sensor every 10 days.   CYCLOBENZAPRINE  (FLEXERIL ) 10 MG TABLET  Take 1 tablet (10 mg total) by mouth 3 (three) times daily as needed for muscle spasms.   CYCLOSPORINE  (RESTASIS ) 0.05 % OPHTHALMIC EMULSION    Apply 1 drop into both eyes twice a day   DULOXETINE  (CYMBALTA ) 20 MG CAPSULE    Take 1 capsule (20 mg total) by mouth daily.   DULOXETINE  (CYMBALTA ) 60 MG CAPSULE    Take 1 capsule (60 mg total) by mouth daily.   FLUTICASONE  (FLONASE ) 50 MCG/ACT NASAL SPRAY    Place 1 spray into both nostrils daily as needed for allergies or rhinitis.   FUROSEMIDE  (LASIX ) 20 MG TABLET    Take 1 tablet (20 mg total) by mouth daily as needed (weight gain).   GABAPENTIN  (NEURONTIN ) 300 MG CAPSULE    Take 1 capsule (300 mg total) by mouth 3 (three) times daily.   GLUCAGON  (BAQSIMI  ONE PACK) 3 MG/DOSE POWD    Place 1 Device into the nose as needed (Low blood sugar with impaired consciousness).   GLUCOSE BLOOD (ACCU-CHEK GUIDE) TEST STRIP    Use to check blood sugar 3 times daily   HYDROXYZINE  (ATARAX ) 50 MG TABLET    Take 1 tablet (50 mg total) by mouth 3 (three) times daily.   IBUPROFEN  (ADVIL ) 800 MG TABLET    Take 1 tablet (800 mg total) by mouth 3 (three) times  daily.   INSULIN  PEN NEEDLE (PEN NEEDLES) 31G X 5 MM MISC    Use to inject insulin  three times daily.   INSULIN  REGULAR HUMAN CONCENTRATED (HUMULIN  R U-500 KWIKPEN) 500 UNIT/ML KWIKPEN    Inject 70 Units into the skin daily with breakfast AND 50 Units daily before lunch AND 60 Units daily before supper. Take 30 minutes before meals.   LORATADINE  (CLARITIN ) 10 MG TABLET    Take 1 tablet (10 mg total) by mouth daily as needed for allergies.   METOCLOPRAMIDE  (REGLAN ) 10 MG TABLET    Take 1 tablet (10 mg total) by mouth 3 (three) times daily before meals.   METOCLOPRAMIDE  (REGLAN ) 5 MG TABLET    Take 1 tablet (5 mg total) by mouth every 8 (eight) hours as needed for nausea. Dose decrease   METOPROLOL  TARTRATE (LOPRESSOR ) 25 MG TABLET    Take 1 tablet (25 mg total) by mouth 2 (two) times daily.   MOMETASONE -FORMOTEROL  (DULERA) 100-5 MCG/ACT AERO    Inhale 2 puffs into the lungs in the morning and at bedtime.   MULTIPLE VITAMINS-MINERALS (EYE VITAMINS PO)    Take 1 capsule by mouth daily.   OLANZAPINE -SAMIDORPHAN (LYBALVI ) 15-10 MG TABS    Take 15 mg by mouth at bedtime.   OMEPRAZOLE  (PRILOSEC) 40 MG CAPSULE    TAKE 1 CAPSULE BY MOUTH ONCE DAILY 30 MINUTES BEFORE BREAKFAST   ONDANSETRON  (ZOFRAN -ODT) 4 MG DISINTEGRATING TABLET    Take 1 tablet (4 mg total) by mouth every 8 (eight) hours as needed for nausea or vomiting.   QUETIAPINE  (SEROQUEL ) 25 MG TABLET    Take 1 tablet (25 mg total) by mouth at bedtime as needed.   SUMATRIPTAN  (IMITREX ) 100 MG TABLET    TAKE 1 TABLET EARLIEST ONSET OF MIGRAINE. MAY REPEAT IN 2 HOURS IF HEADACHE PERSISTS OR RECURS. MAXIMUM 2 TABLETS IN 24 HOURS.   TRAZODONE  (DESYREL ) 50 MG TABLET    Take 1-2 tablets (50-100 mg total) by mouth at bedtime as needed for sleep.   VITAMIN B-12 (CYANOCOBALAMIN) 50 MCG TABLET    Take 50 mcg by mouth daily.  Modified Medications  No medications on file  Discontinued Medications   No medications on file    Allergies Allergies  Allergen  Reactions   Aspirin Hives   Trulicity  [Dulaglutide ] Other (See Comments)    DC'd due to chronic gastric and abdominal pain with nausea   Oxycodone  Nausea And Vomiting    Past Medical History Past Medical History:  Diagnosis Date   Allergy    Anxiety    Arthritis    Asthma    Depression    Diabetes mellitus without complication (HCC)    GERD (gastroesophageal reflux disease)    Glaucoma suspect    Hyperlipidemia    Trichomonas infection     Past Surgical History Past Surgical History:  Procedure Laterality Date   CESAREAN SECTION     UPPER GASTROINTESTINAL ENDOSCOPY     VAGINAL HYSTERECTOMY  12/23/2004   Fibroids, menorrhagia, benign pathology    Family History family history includes Colon cancer in her paternal grandmother; Colon cancer (age of onset: 34) in her father; Depression in her mother; Diabetes in her father and mother; Hypertension in her father and mother; Mental illness in her mother.  Social History Social History   Socioeconomic History   Marital status: Significant Other    Spouse name: Not on file   Number of children: 3   Years of education: 14   Highest education level: Not on file  Occupational History   Not on file  Tobacco Use   Smoking status: Former    Current packs/day: 0.00    Average packs/day: 0.5 packs/day for 13.0 years (6.5 ttl pk-yrs)    Types: Cigarettes    Start date: 2008    Quit date: 2021    Years since quitting: 4.4    Passive exposure: Past   Smokeless tobacco: Never  Vaping Use   Vaping status: Never Used  Substance and Sexual Activity   Alcohol use: Not Currently    Comment: occasional   Drug use: Not Currently    Types: Marijuana    Comment: occ   Sexual activity: Yes    Birth control/protection: Surgical  Other Topics Concern   Not on file  Social History Narrative   Patient lives at home with mother and father , one story    Patient has 3 children    Patient is single   Patient has 14 years of  education    Patient is right handed    Caffeine none   Social Drivers of Corporate investment banker Strain: High Risk (05/26/2024)   Overall Financial Resource Strain (CARDIA)    Difficulty of Paying Living Expenses: Hard  Food Insecurity: Food Insecurity Present (05/26/2024)   Hunger Vital Sign    Worried About Running Out of Food in the Last Year: Often true    Ran Out of Food in the Last Year: Often true  Transportation Needs: No Transportation Needs (05/26/2024)   PRAPARE - Administrator, Civil Service (Medical): No    Lack of Transportation (Non-Medical): No  Recent Concern: Transportation Needs - Unmet Transportation Needs (05/11/2024)   PRAPARE - Transportation    Lack of Transportation (Medical): Yes    Lack of Transportation (Non-Medical): Yes  Physical Activity: Inactive (05/11/2024)   Exercise Vital Sign    Days of Exercise per Week: 0 days    Minutes of Exercise per Session: 0 min  Stress: Stress Concern Present (05/11/2024)   [employer] of Occupational Health - Occupational Stress Questionnaire    Feeling  of Stress : Very much  Social Connections: Unknown (05/11/2024)   Social Connection and Isolation Panel    Frequency of Communication with Friends and Family: More than three times a week    Frequency of Social Gatherings with Friends and Family: More than three times a week    Attends Religious Services: More than 4 times per year    Active Member of Clubs or Organizations: Yes    Attends Banker Meetings: 1 to 4 times per year    Marital Status: Patient declined  Intimate Partner Violence: Not At Risk (05/11/2024)   Humiliation, Afraid, Rape, and Kick questionnaire    Fear of Current or Ex-Partner: No    Emotionally Abused: No    Physically Abused: No    Sexually Abused: No    Lab Results  Component Value Date   HGBA1C 10.0 (H) 05/01/2024   HGBA1C 9.8 (H) 02/18/2024   HGBA1C 10.3 (H) 12/23/2023   Lab Results  Component Value  Date   CHOL 169 03/31/2024   Lab Results  Component Value Date   HDL 40 (L) 03/31/2024   Lab Results  Component Value Date   LDLCALC 105 (H) 03/31/2024   Lab Results  Component Value Date   TRIG 142 03/31/2024   Lab Results  Component Value Date   CHOLHDL 4.2 03/31/2024   Lab Results  Component Value Date   CREATININE 0.98 05/24/2024   Lab Results  Component Value Date   GFR 78.96 10/09/2022   Lab Results  Component Value Date   MICROALBUR 0.6 11/17/2023      Component Value Date/Time   NA 138 05/24/2024 1057   NA 138 03/23/2024 0934   K 3.6 05/24/2024 1057   CL 102 05/24/2024 1057   CO2 23 05/24/2024 1057   GLUCOSE 328 (H) 05/24/2024 1057   BUN 13 05/24/2024 1057   BUN 10 03/23/2024 0934   CREATININE 0.98 05/24/2024 1057   CREATININE 0.90 11/17/2023 1047   CALCIUM  9.5 05/24/2024 1057   PROT 7.9 05/24/2024 1057   PROT 6.9 03/23/2024 0934   ALBUMIN 4.2 05/24/2024 1057   ALBUMIN 4.3 03/23/2024 0934   AST 26 05/24/2024 1057   ALT 24 05/24/2024 1057   ALKPHOS 108 05/24/2024 1057   BILITOT 0.9 05/24/2024 1057   BILITOT 0.3 03/23/2024 0934   GFRNONAA >60 05/24/2024 1057   GFRAA >60 08/10/2020 2100      Latest Ref Rng & Units 05/24/2024   10:57 AM 05/01/2024    8:56 AM 04/30/2024   10:17 AM  BMP  Glucose 70 - 99 mg/dL 671  723  621   BUN 6 - 20 mg/dL 13  8  9    Creatinine 0.44 - 1.00 mg/dL 9.01  9.18  9.04   Sodium 135 - 145 mmol/L 138  139  137   Potassium 3.5 - 5.1 mmol/L 3.6  3.7  3.7   Chloride 98 - 111 mmol/L 102  107  98   CO2 22 - 32 mmol/L 23  24  26    Calcium  8.9 - 10.3 mg/dL 9.5  8.5  9.1        Component Value Date/Time   WBC 10.1 05/24/2024 1057   RBC 5.03 05/24/2024 1057   HGB 13.0 05/24/2024 1057   HGB 13.3 12/12/2022 1036   HCT 42.0 05/24/2024 1057   HCT 40.4 12/12/2022 1036   PLT 430 (H) 05/24/2024 1057   PLT 436 12/12/2022 1036   MCV 83.5 05/24/2024 1057  MCV 86 12/12/2022 1036   MCH 25.8 (L) 05/24/2024 1057   MCHC 31.0  05/24/2024 1057   RDW 15.9 (H) 05/24/2024 1057   RDW 12.9 12/12/2022 1036   LYMPHSABS 1.1 06/20/2023 1122   LYMPHSABS 2.4 07/14/2019 1058   MONOABS 0.6 06/20/2023 1122   EOSABS 0.0 06/20/2023 1122   EOSABS 0.1 07/14/2019 1058   BASOSABS 0.0 06/20/2023 1122   BASOSABS 0.0 07/14/2019 1058     Parts of this note may have been dictated using voice recognition software. There may be variances in spelling and vocabulary which are unintentional. Not all errors are proofread. Please notify the dino if any discrepancies are noted or if the meaning of any statement is not clear.

## 2024-06-22 ENCOUNTER — Telehealth: Payer: Self-pay

## 2024-06-22 ENCOUNTER — Telehealth

## 2024-06-23 ENCOUNTER — Encounter

## 2024-06-23 ENCOUNTER — Other Ambulatory Visit: Payer: Self-pay

## 2024-07-01 ENCOUNTER — Telehealth: Payer: Self-pay | Admitting: Pulmonary Disease

## 2024-07-01 DIAGNOSIS — G4733 Obstructive sleep apnea (adult) (pediatric): Secondary | ICD-10-CM | POA: Diagnosis not present

## 2024-07-01 NOTE — Telephone Encounter (Signed)
 Call patient  Sleep study result  Date of study: 06/23/2024  Impression: Mild obstructive sleep apnea with mild oxygen desaturations. AHI of 5.6, O2 nadir of 81%  Recommendation: Options of treatment for mild obstructive sleep apnea will include  1.  CPAP therapy if there is significant daytime sleepiness or other comorbidities including history of CVA or cardiac disease -If CPAP is chosen as an option of treatment auto titrating CPAP with a pressure setting of 5-15 will be appropriate  2.  Watchful waiting with emphasis on weight loss measures, sleep position modification to optimize lateral sleep, elevating the head of the bed by about 30 degrees may also help.  3.  An oral device may be fashioned for the treatment of mild sleep disordered breathing, will involve referral to dentist.   Follow-up as previously scheduled

## 2024-07-02 NOTE — Telephone Encounter (Signed)
 Spoke with regarding sleep study   Sleep study result   Date of study: 06/23/2024   Impression: Mild obstructive sleep apnea with mild oxygen desaturations. AHI of 5.6, O2 nadir of 81%   Recommendation: Options of treatment for mild obstructive sleep apnea will include   1.  CPAP therapy if there is significant daytime sleepiness or other comorbidities including history of CVA or cardiac disease -If CPAP is chosen as an option of treatment auto titrating CPAP with a pressure setting of 5-15 will be appropriate   2.  Watchful waiting with emphasis on weight loss measures, sleep position modification to optimize lateral sleep, elevating the head of the bed by about 30 degrees may also help.   3.  An oral device may be fashioned for the treatment of mild sleep disordered breathing, will involve referral to dentist.     Follow-up as previously scheduled  Patient's voice was understanding.

## 2024-07-06 ENCOUNTER — Other Ambulatory Visit: Payer: Self-pay

## 2024-07-07 ENCOUNTER — Ambulatory Visit: Payer: Self-pay | Admitting: Internal Medicine

## 2024-07-07 ENCOUNTER — Other Ambulatory Visit: Payer: Self-pay

## 2024-07-07 NOTE — Patient Outreach (Signed)
 BSW contacted patient to follow up and ensure that they had received the previously mailed resources. Patient reported they have not yet gone to their designated mail pick-up location and therefore had not received the materials. BSW resent the resources to the patient and encouraged them to follow up if any additional resources are needed.   Orlean Fey, BSW [place]  [facility] Social Worker, [employer] [facility] [phone]

## 2024-07-07 NOTE — Patient Instructions (Signed)

## 2024-07-08 ENCOUNTER — Telehealth: Payer: Self-pay | Admitting: *Deleted

## 2024-07-08 ENCOUNTER — Encounter (HOSPITAL_COMMUNITY): Payer: Self-pay

## 2024-07-08 ENCOUNTER — Encounter: Payer: Self-pay | Admitting: *Deleted

## 2024-07-08 ENCOUNTER — Ambulatory Visit (HOSPITAL_COMMUNITY)
Admission: RE | Admit: 2024-07-08 | Discharge: 2024-07-08 | Disposition: A | Discharge status: Home / Self Care | Payer: Self-pay

## 2024-07-08 ENCOUNTER — Other Ambulatory Visit: Payer: Self-pay

## 2024-07-08 VITALS — BP 133/90 | HR 102 | Temp 98.1°F | Resp 18

## 2024-07-08 DIAGNOSIS — M79602 Pain in left arm: Secondary | ICD-10-CM | POA: Diagnosis not present

## 2024-07-08 DIAGNOSIS — M79601 Pain in right arm: Secondary | ICD-10-CM

## 2024-07-08 DIAGNOSIS — S161XXA Strain of muscle, fascia and tendon at neck level, initial encounter: Secondary | ICD-10-CM

## 2024-07-08 MED ORDER — METHOCARBAMOL 500 MG PO TABS
500.0000 mg | ORAL_TABLET | Freq: Two times a day (BID) | ORAL | 0 refills | Status: DC
  Filled 2024-07-08: qty 20, 10d supply, fill #0

## 2024-07-08 NOTE — Discharge Instructions (Signed)
 Take the muscle relaxers twice daily to help loosen up your stiff muscles.  Heat, gentle compress and massage can help as well.  Muscle relaxers may cause drowsiness, do not drink alcohol or drive on these medications.  For any breakthrough pain you can alternate between ibuprofen  and Tylenol  every 4-6 hours.  If no improvement over the next week please follow-up with primary care or return to clinic for reevaluation.

## 2024-07-08 NOTE — ED Triage Notes (Signed)
 Pt states she has bilateral upper arm pain since Saturday. She also has neck pain and can't turn her head very well. She took a muscle relaxer 3 days and it did not help. Pt states pain worse when she sleeps.

## 2024-07-08 NOTE — ED Provider Notes (Addendum)
 [facility]    CSN: [id number] Arrival date & time: 07/08/24  1017      History   Chief Complaint Chief Complaint  Patient presents with   Arm Pain    HPI Whitney Benton is a 53 y.o. female.   Patient presents to clinic over concern of bilateral upper arm pain and neck stiffness with range of motion.  Symptoms have been present for the past 6 to 7 days.  Reports she has not had any injury, traumas or falls and she has not been physically active so she is unsure where the pain came from.  Denies numbness, tingling or weakness. Pain with full ROM of arms in shoulders.   Has not had any spinal tenderness.  Took a muscle relaxer without much improvement.  Pain is worse when she goes to sleep.  Has not had any leg pain or stiffness in other areas.  She is a poorly controlled type I diabetic with Dexcom.  Reports her sugars range from the 100s-300s, last A1c 10.0 a few months ago.  Has not had any polyuria, polydipsia, urgency or frequency.  The history is provided by the patient and medical records.  Arm Pain    Past Medical History:  Diagnosis Date   Allergy    Anxiety    Arthritis    Asthma    Depression    Diabetes mellitus without complication (HCC)    GERD (gastroesophageal reflux disease)    Glaucoma suspect    Hyperlipidemia    Trichomonas infection     Patient Active Problem List   Diagnosis Date Noted   Uncontrolled type 2 diabetes mellitus with hyperglycemia, with long-term current use of insulin  (HCC) 04/30/2024   Intractable nausea and vomiting 12/23/2023   Diabetic gastroparesis (HCC) 12/22/2023   DKA, type 1 (HCC) 06/20/2023   Essential hypertension 06/20/2023   Vitamin D  deficiency 06/20/2023   Hyperlipidemia 03/06/2023   Primary insomnia 12/27/2022   Gallstone pancreatitis 12/03/2022   Chronic cholecystitis with calculus 12/03/2022   Morbid obesity (HCC) 04/04/2022   Former smoker 04/30/2021   Major depressive disorder, recurrent  episode, moderate (HCC) 04/30/2021   Substance induced mood disorder (HCC) 03/22/2021   Generalized anxiety disorder 03/22/2021   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   DKA (diabetic ketoacidosis) (HCC) 01/23/2021   Glaucoma suspect 04/12/2020   DKA, type 2, not at goal [facility]) 10/02/2019   AKI (acute kidney injury) (HCC) 10/02/2019   Hyperkalemia 10/02/2019   Leukocytosis 10/02/2019   Abnormal LFTs 10/02/2019   Stressful life events affecting family and household 08/06/2019   New onset type 2 diabetes mellitus (HCC) 02/04/2019   Morbid obesity with BMI of 40.0-44.9, adult (HCC) 02/04/2019   Tobacco dependence 02/04/2019   Left arm weakness 12/06/2014   Paresthesias/numbness 12/06/2014   Tobacco abuse 11/14/2014   Depression    Mixed incontinence 06/27/2014   History of TVH in 2006 for fibroids and menorrhagia; benign pathology 09/08/2011    Past Surgical History:  Procedure Laterality Date   CESAREAN SECTION     UPPER GASTROINTESTINAL ENDOSCOPY     VAGINAL HYSTERECTOMY  12/23/2004   Fibroids, menorrhagia, benign pathology    OB History     Gravida  5   Para  3   Term  3   Preterm      AB  2   Living  3      SAB  2   IAB  0   Ectopic  0  Multiple  0   Live Births               Home Medications    Prior to Admission medications   Medication Sig Start Date End Date Taking? Authorizing Provider  AIMOVIG  140 MG/ML SOAJ SMARTSIG:140 Milligram(s) SUB-Q Every 4 Weeks 06/10/24  Yes [provider]  amLODipine  (NORVASC ) 10 MG tablet Take 1 tablet (10 mg total) by mouth daily. 05/20/24  Yes [name], MD  atorvastatin  (LIPITOR) 40 MG tablet Take 1 tablet (40 mg total) by mouth daily. 05/11/24  Yes Motwani, Komal, MD  busPIRone  (BUSPAR ) 15 MG tablet Take 1 tablet (15 mg total) by mouth 3 (three) times daily. 05/27/24  Yes Harl Regan E, NP  Cholecalciferol  (VITAMIN D3) 10 MCG (400 UNIT) CAPS Take 400 Units by mouth daily.    Yes [provider]  cyclobenzaprine  (FLEXERIL ) 10 MG tablet Take 1 tablet (10 mg total) by mouth 3 (three) times daily as needed for muscle spasms. 04/07/24  Yes Currieo, Andrew D, MD  cycloSPORINE  (RESTASIS ) 0.05 % ophthalmic emulsion Apply 1 drop into both eyes twice a day 09/19/21  Yes   DULoxetine  (CYMBALTA ) 20 MG capsule Take 1 capsule (20 mg total) by mouth daily. 05/27/24  Yes Harl Regan E, NP  DULoxetine  (CYMBALTA ) 60 MG capsule Take 1 capsule (60 mg total) by mouth daily. 05/27/24  Yes Harl Regan E, NP  gabapentin  (NEURONTIN ) 300 MG capsule Take 1 capsule (300 mg total) by mouth 3 (three) times daily. 05/27/24  Yes Harl Regan E, NP  hydrOXYzine  (ATARAX ) 50 MG tablet Take 1 tablet (50 mg total) by mouth 3 (three) times daily. 05/27/24  Yes Harl Regan BRAVO, NP  insulin  regular human CONCENTRATED (HUMULIN  R U-500 KWIKPEN) 500 UNIT/ML KwikPen Inject 70 Units into the skin daily with breakfast AND 50 Units daily before lunch AND 60 Units daily before supper. Take 30 minutes before meals. Patient taking differently: Inject 80 units into the skin before breakfast, 60 units before lunch, and 70-75 units before supper- 30 minutes before meals 02/16/24  Yes [name], MD  loratadine  (CLARITIN ) 10 MG tablet Take 1 tablet (10 mg total) by mouth daily as needed for allergies. 05/11/24  Yes [name], MD  methocarbamol  (ROBAXIN ) 500 MG tablet Take 1 tablet (500 mg total) by mouth 2 (two) times daily. 07/08/24  Yes Wesam Gearhart  N, FNP  metoprolol  tartrate (LOPRESSOR ) 25 MG tablet Take 1 tablet (25 mg total) by mouth 2 (two) times daily. 03/23/24  Yes Wyn Jackee VEAR Mickey., NP  mometasone -formoterol  (DULERA) 100-5 MCG/ACT AERO Inhale 2 puffs into the lungs in the morning and at bedtime. 05/24/24  Yes Desai, Nikita S, MD  Multiple Vitamins-Minerals (EYE VITAMINS PO) Take 1 capsule by mouth daily.   Yes [provider]  OLANZapine -Samidorphan (LYBALVI ) 15-10 MG  TABS Take 15 mg by mouth at bedtime. 05/27/24  Yes Harl Regan E, NP  omeprazole  (PRILOSEC) 40 MG capsule TAKE 1 CAPSULE BY MOUTH ONCE DAILY 30 MINUTES BEFORE BREAKFAST 03/10/23  Yes Kerman Vina HERO, NP  QUEtiapine  (SEROQUEL ) 25 MG tablet Take 1 tablet (25 mg total) by mouth at bedtime as needed. 05/27/24  Yes Harl Regan E, NP  traZODone  (DESYREL ) 50 MG tablet Take 1-2 tablets (50-100 mg total) by mouth at bedtime as needed for sleep. 05/27/24  Yes Harl Regan E, NP  vitamin B-12 (CYANOCOBALAMIN) 50 MCG tablet Take 50 mcg by mouth daily.   Yes [provider]  Accu-Chek  Softclix Lancets lancets Use to check blood sugar 3 times daily 08/11/23   [name], MD  albuterol  (VENTOLIN  HFA) 108 (90 Base) MCG/ACT inhaler Inhale 2 puffs into the lungs every 6 (six) hours as needed for wheezing or shortness of breath (Cough). 05/11/24   [name], MD  Blood Glucose Monitoring Suppl (ACCU-CHEK GUIDE) w/Device KIT Use to check blood sugar 3 times daily. 08/11/23   [name], MD  Blood Pressure Monitoring (BLOOD PRESSURE CUFF) MISC 1 each by Does not apply route daily. Please provide Large cuff 03/23/24   Wyn Jackee VEAR Mickey., NP  cephALEXin  (KEFLEX ) 500 MG capsule Take 500 mg by mouth 2 (two) times daily.    [provider]  Continuous Glucose Sensor (DEXCOM G7 SENSOR) MISC Change sensor every 10 days. 03/03/24   Motwani, Komal, MD  fluticasone  (FLONASE ) 50 MCG/ACT nasal spray Place 1 spray into both nostrils daily as needed for allergies or rhinitis. 05/11/24   [name], MD  furosemide  (LASIX ) 20 MG tablet Take 1 tablet (20 mg total) by mouth daily as needed (weight gain). Patient taking differently: Take 20 mg by mouth daily as needed (for swollen feet). 03/23/24   Wyn Jackee VEAR Mickey., NP  Glucagon  (BAQSIMI  ONE PACK) 3 MG/DOSE POWD Place 1 Device into the nose as needed (Low blood sugar with impaired consciousness). 11/24/23   Motwani, Komal, MD  glucose  blood (ACCU-CHEK GUIDE) test strip Use to check blood sugar 3 times daily 08/11/23   [name], MD  ibuprofen  (ADVIL ) 800 MG tablet Take 1 tablet (800 mg total) by mouth 3 (three) times daily. Patient taking differently: Take 800 mg by mouth every 8 (eight) hours as needed (for neck pain). 04/07/24   Currieo, Andrew D, MD  Insulin  Pen Needle (PEN NEEDLES) 31G X 5 MM MISC Use to inject insulin  three times daily. 05/18/24   [name], MD  metoCLOPramide  (REGLAN ) 10 MG tablet Take 1 tablet (10 mg total) by mouth 3 (three) times daily before meals. 11/14/23   Newlin, Enobong, MD  metoCLOPramide  (REGLAN ) 5 MG tablet Take 1 tablet (5 mg total) by mouth every 8 (eight) hours as needed for nausea. Dose decrease 05/11/24   [name], MD  ondansetron  (ZOFRAN -ODT) 4 MG disintegrating tablet Take 1 tablet (4 mg total) by mouth every 8 (eight) hours as needed for nausea or vomiting. 04/29/24   [name], MD  SUMAtriptan  (IMITREX ) 100 MG tablet TAKE 1 TABLET EARLIEST ONSET OF MIGRAINE. MAY REPEAT IN 2 HOURS IF HEADACHE PERSISTS OR RECURS. MAXIMUM 2 TABLETS IN 24 HOURS. 03/11/24 03/11/25  Skeet Juliene SAUNDERS, DO  cetirizine  (ZYRTEC ) 10 MG tablet Take 1 tablet (10 mg total) by mouth daily. Patient not taking: No sig reported 03/31/19 03/03/20  Delbert Clam, MD    Family History Family History  Problem Relation Age of Onset   Diabetes Mother    Mental illness Mother    Depression Mother    Hypertension Mother    Colon cancer Father 58   Hypertension Father    Diabetes Father    Colon cancer Paternal Grandmother    Esophageal cancer Neg Hx    Stomach cancer Neg Hx    Rectal cancer Neg Hx    Asthma Neg Hx     Social History Social History   Tobacco Use   Smoking status: Former    Current packs/day: 0.00    Average packs/day: 0.5 packs/day for 13.0 years (6.5  ttl pk-yrs)    Types: Cigarettes    Start date: 2008    Quit date: 2021    Years since quitting: 4.5    Passive  exposure: Past   Smokeless tobacco: Never  Vaping Use   Vaping status: Never Used  Substance Use Topics   Alcohol use: Not Currently    Comment: occasional   Drug use: Yes    Types: Marijuana    Comment: occ     Allergies   Aspirin, Trulicity  [dulaglutide ], and Oxycodone    Review of Systems Review of Systems  Per HPI  Physical Exam Triage Vital Signs ED Triage Vitals  Encounter Vitals Group     BP 07/08/24 1042 (!) 133/90     Girls Systolic BP Percentile --      Girls Diastolic BP Percentile --      Boys Systolic BP Percentile --      Boys Diastolic BP Percentile --      Pulse Rate 07/08/24 1042 (!) 102     Resp 07/08/24 1042 18     Temp 07/08/24 1042 98.1 F (36.7 C)     Temp Source 07/08/24 1042 Oral     SpO2 07/08/24 1042 95 %     Weight --      Height --      Head Circumference --      Peak Flow --      Pain Score 07/08/24 1039 8     Pain Loc --      Pain Education --      Exclude from Growth Chart --    No data found.  Updated Vital Signs BP (!) 133/90 (BP Location: Right Arm)   Pulse (!) 102   Temp 98.1 F (36.7 C) (Oral)   Resp 18   SpO2 95%   Visual Acuity Right Eye Distance:   Left Eye Distance:   Bilateral Distance:    Right Eye Near:   Left Eye Near:    Bilateral Near:     Physical Exam Vitals and nursing note reviewed.  Constitutional:      Appearance: Normal appearance.  HENT:     Head: Normocephalic and atraumatic.     Right Ear: External ear normal.     Left Ear: External ear normal.     Nose: Nose normal.     Mouth/Throat:     Mouth: Mucous membranes are moist.  Eyes:     Conjunctiva/sclera: Conjunctivae normal.  Cardiovascular:     Rate and Rhythm: Normal rate.  Pulmonary:     Effort: Pulmonary effort is normal. No respiratory distress.  Musculoskeletal:        General: Tenderness present. No swelling, deformity or signs of injury.     Cervical back: Muscular tenderness present. No spinous process tenderness.        Back:     Comments: Muscular neck pain that extends into shoulders bilaterally. Cervical spine w/o step-off, TTP or deformity. Strength 5/5 BUE.   Skin:    General: Skin is warm and dry.  Neurological:     General: No focal deficit present.     Mental Status: She is alert and oriented to person, place, and time.  Psychiatric:        Mood and Affect: Mood normal.        Behavior: Behavior normal. Behavior is cooperative.      UC Treatments / Results  Labs (all labs ordered are listed, but only abnormal results are displayed) Labs Reviewed -  No data to display  EKG   Radiology No results found.  Procedures Procedures (including critical care time)  Medications Ordered in UC Medications - No data to display  Initial Impression / Assessment and Plan / UC Course  I have reviewed the triage vital signs and the nursing notes.  Pertinent labs & imaging results that were available during my care of the patient were reviewed by me and considered in my medical decision making (see chart for details).  Vitals and triage reviewed, patient is hemodynamically stable.  Cervical spine without step-off, tenderness or deformity, atraumatic.  Imaging deferred.  Neck muscular stiffness with range of motion, pain appears to be soft tissue in nature.  Will treat with muscle relaxers.  Patient not a good candidate for steroids due to poorly controlled type 1 diabetes.  Symptomatic management for muscle pain discussed.  Plan of care, follow-up care return precautions given, no questions at this time.     Final Clinical Impressions(s) / UC Diagnoses   Final diagnoses:  Neck muscle strain, initial encounter  Bilateral arm pain     Discharge Instructions      Take the muscle relaxers twice daily to help loosen up your stiff muscles.  Heat, gentle compress and massage can help as well.  Muscle relaxers may cause drowsiness, do not drink alcohol or drive on these medications.  For any  breakthrough pain you can alternate between ibuprofen  and Tylenol  every 4-6 hours.  If no improvement over the next week please follow-up with primary care or return to clinic for reevaluation.    ED Prescriptions     Medication Sig Dispense Auth. Provider   methocarbamol  (ROBAXIN ) 500 MG tablet Take 1 tablet (500 mg total) by mouth 2 (two) times daily. 20 tablet Dreama, Krystle Polcyn  N, FNP      PDMP not reviewed this encounter.   Dreama, Zora Glendenning  N, FNP 07/08/24 1132    Dreama, Jerimy Johanson  N, FNP 07/08/24 1133

## 2024-07-10 ENCOUNTER — Other Ambulatory Visit: Payer: Self-pay | Admitting: Nurse Practitioner

## 2024-07-13 ENCOUNTER — Ambulatory Visit (INDEPENDENT_AMBULATORY_CARE_PROVIDER_SITE_OTHER): Admitting: Clinical

## 2024-07-13 DIAGNOSIS — F411 Generalized anxiety disorder: Secondary | ICD-10-CM

## 2024-07-13 NOTE — Progress Notes (Signed)
   THERAPIST PROGRESS NOTE  Session Time: 40 minutes  Participation Level: Active  Behavioral Response: CasualAlertAnxious  Type of Therapy: Individual Therapy  Treatment Goals addressed: Amily WILL PARTICIPATE IN AT LEAST 80% OF SCHEDULED INDIVIDUAL PSYCHOTHERAPY SESSIONS   ProgressTowards Goals: Progressing  Interventions: CBT and Supportive  Summary:  Whitney Benton is a 53 y.o. female who presents for the scheduled appointment oriented times five, appropriately dressed and friendly. Client denied hallucinations and delusions. Client reported she has been managing her stress. Client reported she is wanting to find her own place to stay but does not have the financial means to do so. Client reported her oldest son whom she has been living has made comments to her about how he shouldn't be taking care of her how he does if she has a boyfriend. Client reported he is not going to put her out. Client reported she has had issues with her car needing repairs. Client reported also she has not heard about her disability case. Client reported her sister has continued to stay at the house with her kids. Client reported her sisters son is planning on moving in to the house. Client reported she is being nice and will let her sister have the accomodation. Client reported her sister is making payments at the house but they are still working on getting caught up. Client reported she is going to keep the house in her name.  Evidence of progress towards goal:  client reported 1 positive of minimizing conflict within the family.   Suicidal/Homicidal: Nowithout intent/plan  Therapist Response:  Therapist began the appointment asking how she has been doing. Therapist engaged with active listening and positive emotional support. Therapist used cbt to engage and give her time to discuss her thoughts and feelings about everything. Therapist used cbt to positively encourage her to continue communicating as  well as making lifestyle changes to improve her health. Therapist used CBT ask the client to identify her progress with frequency of use with coping skills with continued practice in her daily activity.    Therapist assigned her homework to practice self care.   Plan: Return again in 4 weeks.  Diagnosis: GAD  Collaboration of Care: Patient refused AEB none requested by the client.  Patient/Guardian was advised Release of Information must be obtained prior to any record release in order to collaborate their care with an outside provider. Patient/Guardian was advised if they have not already done so to contact the registration department to sign all necessary forms in order for us  to release information regarding their care.   Consent: Patient/Guardian gives verbal consent for treatment and assignment of benefits for services provided during this visit. Patient/Guardian expressed understanding and agreed to proceed.   [name], LCSW 07/13/2024

## 2024-07-14 ENCOUNTER — Other Ambulatory Visit: Payer: Self-pay | Admitting: Internal Medicine

## 2024-07-14 ENCOUNTER — Other Ambulatory Visit: Payer: Self-pay | Admitting: "Endocrinology

## 2024-07-14 ENCOUNTER — Other Ambulatory Visit: Payer: Self-pay

## 2024-07-14 DIAGNOSIS — E1065 Type 1 diabetes mellitus with hyperglycemia: Secondary | ICD-10-CM

## 2024-07-14 NOTE — Telephone Encounter (Signed)
 Pt states she is serious considering CPAP.  Would like callback to discuss further. [phone]

## 2024-07-15 ENCOUNTER — Other Ambulatory Visit: Payer: Self-pay

## 2024-07-15 ENCOUNTER — Ambulatory Visit (HOSPITAL_COMMUNITY)
Admission: RE | Admit: 2024-07-15 | Discharge: 2024-07-15 | Disposition: A | Discharge status: Home / Self Care | Payer: Self-pay | Source: Ambulatory Visit

## 2024-07-15 ENCOUNTER — Encounter (HOSPITAL_COMMUNITY): Payer: Self-pay

## 2024-07-15 VITALS — BP 143/91 | HR 103 | Temp 98.3°F | Resp 20

## 2024-07-15 DIAGNOSIS — M25511 Pain in right shoulder: Secondary | ICD-10-CM

## 2024-07-15 DIAGNOSIS — M25512 Pain in left shoulder: Secondary | ICD-10-CM | POA: Diagnosis not present

## 2024-07-15 MED ORDER — METHOCARBAMOL 500 MG PO TABS: 500.0000 mg | ORAL_TABLET | Freq: Two times a day (BID) | ORAL | 0 refills | Status: DC

## 2024-07-15 MED ORDER — DICLOFENAC SODIUM 50 MG PO TBEC: 50.0000 mg | DELAYED_RELEASE_TABLET | Freq: Two times a day (BID) | ORAL | 1 refills | Status: DC

## 2024-07-15 MED FILL — Insulin Regular (Human) Soln Pen-Injector 500 Unit/ML: SUBCUTANEOUS | 25 days supply | Qty: 12 | Fill #0 | Status: AC

## 2024-07-15 NOTE — Discharge Instructions (Addendum)
  1. Acute pain of both shoulders (Primary) - methocarbamol  (ROBAXIN ) 500 MG tablet; Take 1 tablet (500 mg total) by mouth 2 (two) times daily.  Dispense: 20 tablet; Refill: 0 - diclofenac  (VOLTAREN ) 50 MG EC tablet; Take 1 tablet (50 mg total) by mouth 2 (two) times daily.  Dispense: 30 tablet; Refill: 1 - AMB referral to sports medicine for follow-up evaluation and treatment of recurrent bilateral shoulder pain most likely radiculopathy from cervical spine. -Continue to monitor symptoms for any change in severity if there is any escalation of current symptoms or development of new symptoms follow-up in ER for further evaluation and management.

## 2024-07-15 NOTE — Telephone Encounter (Signed)
 Refill request complete

## 2024-07-15 NOTE — ED Triage Notes (Signed)
 Patient states she has bilateral shoulder pain 1 week. States was seen here last week for same issue.  No improvement, she is taking muscle relaxer.

## 2024-07-15 NOTE — ED Provider Notes (Signed)
[facility]  Note:  This document was prepared using Dragon voice recognition software and may include unintentional dictation errors.  MRN: [id number] DOB: [birth year]  Subjective:   Whitney Benton is a 53 y.o. female presenting for recurrent bilateral shoulder pain x 2 weeks.  Patient was seen last week in urgent care evaluated and prescribed muscle relaxer for shoulder pain.  Patient reports that she has been taking medication as well as Tylenol  with minimal improvement to symptoms.  Patient denies any new injury or trauma to the shoulders or neck.  Patient denies any significant exacerbation to original injury but states that pain did not improve neither did muscle tension.  No current facility-administered medications for this encounter.  Current Outpatient Medications:    Accu-Chek Softclix Lancets lancets, Use to check blood sugar 3 times daily, Disp: 100 each, Rfl: 6   AIMOVIG  140 MG/ML SOAJ, SMARTSIG:140 Milligram(s) SUB-Q Every 4 Weeks, Disp: , Rfl:    albuterol  (VENTOLIN  HFA) 108 (90 Base) MCG/ACT inhaler, Inhale 2 puffs into the lungs every 6 (six) hours as needed for wheezing or shortness of breath (Cough)., Disp: 18 g, Rfl: 0   amLODipine  (NORVASC ) 10 MG tablet, Take 1 tablet (10 mg total) by mouth daily., Disp: 90 tablet, Rfl: 1   atorvastatin  (LIPITOR) 40 MG tablet, Take 1 tablet (40 mg total) by mouth daily., Disp: 90 tablet, Rfl: 2   Blood Glucose Monitoring Suppl (ACCU-CHEK GUIDE) w/Device KIT, Use to check blood sugar 3 times daily., Disp: 1 kit, Rfl: 0   Blood Pressure Monitoring (BLOOD PRESSURE CUFF) MISC, 1 each by Does not apply route daily. Please provide Large cuff, Disp: 1 each, Rfl: 0   busPIRone  (BUSPAR ) 15 MG tablet, Take 1 tablet (15 mg total) by mouth 3 (three) times daily., Disp: 90 tablet, Rfl: 3   Cholecalciferol  (VITAMIN D3) 10 MCG (400 UNIT) CAPS, Take 400 Units by mouth daily., Disp: , Rfl:    Continuous Glucose Sensor (DEXCOM G7 SENSOR) MISC,  Change sensor every 10 days., Disp: 9 each, Rfl: 3   cyclobenzaprine  (FLEXERIL ) 10 MG tablet, Take 1 tablet (10 mg total) by mouth 3 (three) times daily as needed for muscle spasms., Disp: 30 tablet, Rfl: 0   diclofenac  (VOLTAREN ) 50 MG EC tablet, Take 1 tablet (50 mg total) by mouth 2 (two) times daily., Disp: 30 tablet, Rfl: 1   DULoxetine  (CYMBALTA ) 20 MG capsule, Take 1 capsule (20 mg total) by mouth daily., Disp: 30 capsule, Rfl: 3   DULoxetine  (CYMBALTA ) 60 MG capsule, Take 1 capsule (60 mg total) by mouth daily., Disp: 30 capsule, Rfl: 3   fluticasone  (FLONASE ) 50 MCG/ACT nasal spray, Place 1 spray into both nostrils daily as needed for allergies or rhinitis., Disp: 16 g, Rfl: 1   gabapentin  (NEURONTIN ) 300 MG capsule, Take 1 capsule (300 mg total) by mouth 3 (three) times daily., Disp: 90 capsule, Rfl: 3   Glucagon  (BAQSIMI  ONE PACK) 3 MG/DOSE POWD, Place 1 Device into the nose as needed (Low blood sugar with impaired consciousness)., Disp: 2 each, Rfl: 3   glucose blood (ACCU-CHEK GUIDE) test strip, Use to check blood sugar 3 times daily, Disp: 100 each, Rfl: 6   hydrOXYzine  (ATARAX ) 50 MG tablet, Take 1 tablet (50 mg total) by mouth 3 (three) times daily., Disp: 90 tablet, Rfl: 3   Insulin  Pen Needle (PEN NEEDLES) 31G X 5 MM MISC, Use to inject insulin  three times daily., Disp: 100 each, Rfl: 6   insulin  regular human  CONCENTRATED (HUMULIN  R U-500 KWIKPEN) 500 UNIT/ML KwikPen, Inject 85-95 Units into the skin daily with breakfast AND 75-80 Units daily before lunch AND 65-70 Units daily before supper. Take 30 minutes before meals., Disp: 12 mL, Rfl: 3   loratadine  (CLARITIN ) 10 MG tablet, Take 1 tablet (10 mg total) by mouth daily as needed for allergies., Disp: 30 tablet, Rfl: 3   metoCLOPramide  (REGLAN ) 10 MG tablet, Take 1 tablet (10 mg total) by mouth 3 (three) times daily before meals., Disp: 90 tablet, Rfl: 0   metoCLOPramide  (REGLAN ) 5 MG tablet, Take 1 tablet (5 mg total) by mouth  every 8 (eight) hours as needed for nausea. Dose decrease, Disp: 90 tablet, Rfl: 3   metoprolol  tartrate (LOPRESSOR ) 25 MG tablet, Take 1 tablet (25 mg total) by mouth 2 (two) times daily., Disp: 60 tablet, Rfl: 5   mometasone -formoterol  (DULERA) 100-5 MCG/ACT AERO, Inhale 2 puffs into the lungs in the morning and at bedtime., Disp: 1 each, Rfl: 5   Multiple Vitamins-Minerals (EYE VITAMINS PO), Take 1 capsule by mouth daily., Disp: , Rfl:    OLANZapine -Samidorphan (LYBALVI ) 15-10 MG TABS, Take 15 mg by mouth at bedtime., Disp: 30 tablet, Rfl: 3   omeprazole  (PRILOSEC) 40 MG capsule, TAKE 1 CAPSULE BY MOUTH ONCE DAILY 30 MINUTES  BEFORE BREAKFAST, Disp: 90 capsule, Rfl: 0   ondansetron  (ZOFRAN -ODT) 4 MG disintegrating tablet, Take 1 tablet (4 mg total) by mouth every 8 (eight) hours as needed for nausea or vomiting., Disp: 20 tablet, Rfl: 0   QUEtiapine  (SEROQUEL ) 25 MG tablet, Take 1 tablet (25 mg total) by mouth at bedtime as needed., Disp: 30 tablet, Rfl: 3   SUMAtriptan  (IMITREX ) 100 MG tablet, TAKE 1 TABLET EARLIEST ONSET OF MIGRAINE. MAY REPEAT IN 2 HOURS IF HEADACHE PERSISTS OR RECURS. MAXIMUM 2 TABLETS IN 24 HOURS., Disp: 10 tablet, Rfl: 5   traZODone  (DESYREL ) 50 MG tablet, Take 1-2 tablets (50-100 mg total) by mouth at bedtime as needed for sleep., Disp: 60 tablet, Rfl: 3   vitamin B-12 (CYANOCOBALAMIN) 50 MCG tablet, Take 50 mcg by mouth daily., Disp: , Rfl:    cephALEXin  (KEFLEX ) 500 MG capsule, Take 500 mg by mouth 2 (two) times daily., Disp: , Rfl:    cycloSPORINE  (RESTASIS ) 0.05 % ophthalmic emulsion, Apply 1 drop into both eyes twice a day, Disp: 180 each, Rfl: 3   furosemide  (LASIX ) 20 MG tablet, Take 1 tablet (20 mg total) by mouth daily as needed (weight gain). (Patient taking differently: Take 20 mg by mouth daily as needed (for swollen feet).), Disp: 30 tablet, Rfl: 0   methocarbamol  (ROBAXIN ) 500 MG tablet, Take 1 tablet (500 mg total) by mouth 2 (two) times daily., Disp: 20  tablet, Rfl: 0   Allergies  Allergen Reactions   Aspirin Hives   Trulicity  [Dulaglutide ] Other (See Comments)    DC'd due to chronic gastric and abdominal pain with nausea   Oxycodone  Nausea And Vomiting    Past Medical History:  Diagnosis Date   Allergy    Anxiety    Arthritis    Asthma    Depression    Diabetes mellitus without complication (HCC)    GERD (gastroesophageal reflux disease)    Glaucoma suspect    Hyperlipidemia    Trichomonas infection      Past Surgical History:  Procedure Laterality Date   CESAREAN SECTION     UPPER GASTROINTESTINAL ENDOSCOPY     VAGINAL HYSTERECTOMY  12/23/2004   Fibroids, menorrhagia, benign pathology  Family History  Problem Relation Age of Onset   Diabetes Mother    Mental illness Mother    Depression Mother    Hypertension Mother    Colon cancer Father 4   Hypertension Father    Diabetes Father    Colon cancer Paternal Grandmother    Esophageal cancer Neg Hx    Stomach cancer Neg Hx    Rectal cancer Neg Hx    Asthma Neg Hx     Social History   Tobacco Use   Smoking status: Former    Current packs/day: 0.00    Average packs/day: 0.5 packs/day for 13.0 years (6.5 ttl pk-yrs)    Types: Cigarettes    Start date: 2008    Quit date: 2021    Years since quitting: 4.5    Passive exposure: Past   Smokeless tobacco: Never  Vaping Use   Vaping status: Never Used  Substance Use Topics   Alcohol use: Not Currently    Comment: occasional   Drug use: Yes    Types: Marijuana    Comment: occ    ROS Refer to HPI for ROS details.  Objective:   Vitals: BP (!) 143/91   Pulse (!) 103   Temp 98.3 F (36.8 C) (Oral)   Resp 20   SpO2 97%   Physical Exam Vitals and nursing note reviewed.  Constitutional:      General: She is not in acute distress.    Appearance: She is well-developed. She is not ill-appearing or toxic-appearing.  HENT:     Head: Normocephalic and atraumatic.  Cardiovascular:     Rate and  Rhythm: Normal rate.  Pulmonary:     Effort: Pulmonary effort is normal. No respiratory distress.  Musculoskeletal:     Right shoulder: Tenderness present. No swelling, deformity or bony tenderness. Normal range of motion. Normal strength. Normal pulse.     Left shoulder: Tenderness present. No swelling, deformity or bony tenderness. Normal range of motion. Normal strength. Normal pulse.     Cervical back: Normal range of motion and neck supple. No rigidity or tenderness.  Skin:    General: Skin is warm and dry.  Neurological:     General: No focal deficit present.     Mental Status: She is alert and oriented to person, place, and time.  Psychiatric:        Mood and Affect: Mood normal.        Behavior: Behavior normal.     Procedures  No results found. However, due to the size of the patient record, not all encounters were searched. Please check Results Review for a complete set of results.  No results found.   Assessment and Plan :     Discharge Instructions       1. Acute pain of both shoulders (Primary) - methocarbamol  (ROBAXIN ) 500 MG tablet; Take 1 tablet (500 mg total) by mouth 2 (two) times daily.  Dispense: 20 tablet; Refill: 0 - diclofenac  (VOLTAREN ) 50 MG EC tablet; Take 1 tablet (50 mg total) by mouth 2 (two) times daily.  Dispense: 30 tablet; Refill: 1 - AMB referral to sports medicine for follow-up evaluation and treatment of recurrent bilateral shoulder pain most likely radiculopathy from cervical spine. -Continue to monitor symptoms for any change in severity if there is any escalation of current symptoms or development of new symptoms follow-up in ER for further evaluation and management.      Derec Mozingo B Labrandon Knoch   Madelin Weseman, [place], [place] 07/15/24  [phone]  

## 2024-07-16 ENCOUNTER — Other Ambulatory Visit: Payer: Self-pay

## 2024-07-19 ENCOUNTER — Ambulatory Visit: Payer: Self-pay | Admitting: Podiatry

## 2024-07-19 DIAGNOSIS — B351 Tinea unguium: Secondary | ICD-10-CM

## 2024-07-19 MED ORDER — CICLOPIROX 8 % EX SOLN
Freq: Every day | CUTANEOUS | 12 refills | Status: DC
Start: 2024-07-19 — End: 2024-10-21

## 2024-07-20 ENCOUNTER — Other Ambulatory Visit: Payer: Self-pay

## 2024-07-21 ENCOUNTER — Other Ambulatory Visit (INDEPENDENT_AMBULATORY_CARE_PROVIDER_SITE_OTHER): Payer: Self-pay

## 2024-07-21 ENCOUNTER — Ambulatory Visit: Admitting: Physician Assistant

## 2024-07-21 DIAGNOSIS — M25511 Pain in right shoulder: Secondary | ICD-10-CM

## 2024-07-21 DIAGNOSIS — G8929 Other chronic pain: Secondary | ICD-10-CM

## 2024-07-21 DIAGNOSIS — M25512 Pain in left shoulder: Secondary | ICD-10-CM

## 2024-07-21 NOTE — Progress Notes (Signed)
 Office Visit Note   Patient: Whitney Benton           Date of Birth: April 05, 1971           MRN: [id number] Visit Date: 07/21/2024              Requested by: Aurea Ethel NOVAK, NP [address] [place],  [place] [address] PCP: [name], MD   Assessment & Plan: Visit Diagnoses:  1. Chronic pain of both shoulders     Plan: Impression is chronic bilateral shoulder pain right greater than left as well as cervicalgia.  Unfortunately.  The patient is on uncontrolled type II diabetic with a recent hemoglobin A1c around 10.  I do not feel comfortable starting her on a steroids or performing steroid injection Due to this.  She may continue her NSAIDs and muscle relaxers.  We have discussed referral to outpatient physical therapy so she does not also develop a frozen shoulder.  She is agreeable this plan.  She will follow-up with us  as needed.  Call with concerns or questions.  Follow-Up Instructions: Return if symptoms worsen or fail to improve.   Orders:  Orders Placed This Encounter  Procedures   XR Cervical Spine 2 or 3 views   XR Shoulder Right   Ambulatory referral to Physical Therapy   No orders of the defined types were placed in this encounter.     Procedures: No procedures performed   Clinical Data: No additional findings.   Subjective: Chief Complaint  Patient presents with   Left Shoulder - Pain   Right Shoulder - Pain     HPI patient is a pleasant 53 year old female who comes in today with bilateral shoulder pain for the past 3 weeks.  She denies any injury or change in activity.  The pain she has is primarily right lateral neck and radiates into the right proximal deltoid.  She does have pain into the left proximal deltoid as well.  Symptoms appear to be worse when she is sleeping on either shoulder as well as with forward flexion of the right arm.  She has been to urgent care twice where she has been prescribed NSAIDs and Robaxin  with mild relief.   She denies any paresthesias or weakness to either upper extremity.  She has not previously had a shoulder injection or been to physical therapy.  She is unable to take steroids as she appears to be an uncontrolled diabetic with her most recent A1c at 10.  Review of Systems as detailed in HPI.  All others reviewed and are negative.   Objective: Vital Signs: There were no vitals taken for this visit.  Physical Exam well-developed and nourished female in no acute distress.  Alert and oriented x 3.  Ortho Exam cervical spine exam: No spinous or paraspinous tenderness.  She does have moderate tenderness along the upper traps and into the parascapular region on both sides.  Increased pain with cervical spine rotation to the right.  Right shoulder exam: Active forward flexion to approximately 150 degrees.  I can passively get her to about 170 degrees.  Positive empty can testing.  External rotation to 30 degrees and internal rotation to L5.  Left shoulder exam: Near full active range of motion in all planes.  External rotation to 30 and internal rotation to T12.  Full strength throughout.  She is neurovascularly intact distally.  Specialty Comments:  No specialty comments available.  Imaging: XR Shoulder Right Result Date: 07/21/2024  Advanced AC joint space narrowing.  She does have what appears to be a calcification to the lateral aspect of the humeral head  XR Cervical Spine 2 or 3 views Result Date: 07/21/2024 X-rays demonstrate straightening of the cervical spine.  She does have moderate degenerative changes C4-5 and C5-6    PMFS History: Patient Active Problem List   Diagnosis Date Noted   Uncontrolled type 2 diabetes mellitus with hyperglycemia, with long-term current use of insulin  (HCC) 04/30/2024   Intractable nausea and vomiting 12/23/2023   Diabetic gastroparesis (HCC) 12/22/2023   DKA, type 1 (HCC) 06/20/2023   Essential hypertension 06/20/2023   Vitamin D  deficiency 06/20/2023    Hyperlipidemia 03/06/2023   Primary insomnia 12/27/2022   Gallstone pancreatitis 12/03/2022   Chronic cholecystitis with calculus 12/03/2022   Morbid obesity (HCC) 04/04/2022   Former smoker 04/30/2021   Major depressive disorder, recurrent episode, moderate (HCC) 04/30/2021   Substance induced mood disorder (HCC) 03/22/2021   Generalized anxiety disorder 03/22/2021   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   DKA (diabetic ketoacidosis) (HCC) 01/23/2021   Glaucoma suspect 04/12/2020   DKA, type 2, not at goal [facility]) 10/02/2019   AKI (acute kidney injury) (HCC) 10/02/2019   Hyperkalemia 10/02/2019   Leukocytosis 10/02/2019   Abnormal LFTs 10/02/2019   Stressful life events affecting family and household 08/06/2019   New onset type 2 diabetes mellitus (HCC) 02/04/2019   Morbid obesity with BMI of 40.0-44.9, adult (HCC) 02/04/2019   Tobacco dependence 02/04/2019   Left arm weakness 12/06/2014   Paresthesias/numbness 12/06/2014   Tobacco abuse 11/14/2014   Depression    Mixed incontinence 06/27/2014   History of TVH in 2006 for fibroids and menorrhagia; benign pathology 09/08/2011   Past Medical History:  Diagnosis Date   Allergy    Anxiety    Arthritis    Asthma    Depression    Diabetes mellitus without complication (HCC)    GERD (gastroesophageal reflux disease)    Glaucoma suspect    Hyperlipidemia    Trichomonas infection     Family History  Problem Relation Age of Onset   Diabetes Mother    Mental illness Mother    Depression Mother    Hypertension Mother    Colon cancer Father 56   Hypertension Father    Diabetes Father    Colon cancer Paternal Grandmother    Esophageal cancer Neg Hx    Stomach cancer Neg Hx    Rectal cancer Neg Hx    Asthma Neg Hx     Past Surgical History:  Procedure Laterality Date   CESAREAN SECTION     UPPER GASTROINTESTINAL ENDOSCOPY     VAGINAL HYSTERECTOMY  12/23/2004   Fibroids, menorrhagia, benign pathology    Social History   Occupational History   Not on file  Tobacco Use   Smoking status: Former    Current packs/day: 0.00    Average packs/day: 0.5 packs/day for 13.0 years (6.5 ttl pk-yrs)    Types: Cigarettes    Start date: 2008    Quit date: 2021    Years since quitting: 4.5    Passive exposure: Past   Smokeless tobacco: Never  Vaping Use   Vaping status: Never Used  Substance and Sexual Activity   Alcohol use: Not Currently    Comment: occasional   Drug use: Yes    Types: Marijuana    Comment: occ   Sexual activity: Yes    Birth control/protection: Surgical

## 2024-07-27 ENCOUNTER — Ambulatory Visit (HOSPITAL_COMMUNITY): Admitting: Clinical

## 2024-07-28 ENCOUNTER — Ambulatory Visit: Admitting: "Endocrinology

## 2024-07-28 ENCOUNTER — Ambulatory Visit (INDEPENDENT_AMBULATORY_CARE_PROVIDER_SITE_OTHER): Admitting: Physician Assistant

## 2024-07-28 ENCOUNTER — Encounter: Payer: Self-pay | Admitting: "Endocrinology

## 2024-07-28 VITALS — BP 140/80 | HR 85 | Ht 63.0 in | Wt 248.0 lb

## 2024-07-28 DIAGNOSIS — E7800 Pure hypercholesterolemia, unspecified: Secondary | ICD-10-CM | POA: Diagnosis not present

## 2024-07-28 DIAGNOSIS — F411 Generalized anxiety disorder: Secondary | ICD-10-CM | POA: Diagnosis not present

## 2024-07-28 DIAGNOSIS — F333 Major depressive disorder, recurrent, severe with psychotic symptoms: Secondary | ICD-10-CM

## 2024-07-28 DIAGNOSIS — E1065 Type 1 diabetes mellitus with hyperglycemia: Secondary | ICD-10-CM

## 2024-07-28 MED ORDER — LYBALVI 15-10 MG PO TABS
15.0000 mg | ORAL_TABLET | Freq: Every evening | ORAL | 3 refills | Status: DC
Start: 2024-07-28 — End: 2024-09-15

## 2024-07-28 MED ORDER — QUETIAPINE FUMARATE 25 MG PO TABS
25.0000 mg | ORAL_TABLET | Freq: Every evening | ORAL | 3 refills | Status: DC | PRN
Start: 2024-07-28 — End: 2024-09-15

## 2024-07-28 MED ORDER — BUSPIRONE HCL 30 MG PO TABS: 30.0000 mg | ORAL_TABLET | Freq: Two times a day (BID) | ORAL | 3 refills | Status: DC

## 2024-07-28 MED ORDER — GABAPENTIN 300 MG PO CAPS: 300.0000 mg | ORAL_CAPSULE | Freq: Three times a day (TID) | ORAL | 3 refills | Status: DC

## 2024-07-28 MED ORDER — TRAZODONE HCL 150 MG PO TABS
150.0000 mg | ORAL_TABLET | Freq: Every evening | ORAL | 3 refills | Status: DC | PRN
Start: 2024-07-28 — End: 2024-09-15

## 2024-07-28 MED ORDER — DULOXETINE HCL 60 MG PO CPEP: 60.0000 mg | ORAL_CAPSULE | Freq: Two times a day (BID) | ORAL | 3 refills | Status: DC

## 2024-07-28 MED ORDER — HYDROXYZINE HCL 50 MG PO TABS: 50.0000 mg | ORAL_TABLET | Freq: Three times a day (TID) | ORAL | 3 refills | Status: DC

## 2024-07-28 NOTE — Progress Notes (Signed)
 BH MD/PA/NP OP Progress Note  Virtual Visit via Video Note  I connected with Whitney Benton on 07/28/24 at 10:00 AM EDT by a video enabled telemedicine application and verified that I am speaking with the correct person using two identifiers.  Location: Patient: Home Provider: Clinic   I discussed the limitations of evaluation and management by telemedicine and the availability of in person appointments. The patient expressed understanding and agreed to proceed.  Follow Up Instructions:  I discussed the assessment and treatment plan with the patient. The patient was provided an opportunity to ask questions and all were answered. The patient agreed with the plan and demonstrated an understanding of the instructions.   The patient was advised to call back or seek an in-person evaluation if the symptoms worsen or if the condition fails to improve as anticipated.  I provided 21 and minutes of non-face-to-face time during this encounter.  Whitney FORBES Bolster, PA    07/28/2024 10:00 AM Whitney Benton  MRN:  [id number]  Chief Complaint:  Chief Complaint  Patient presents with   Follow-up   Medication Management   HPI:   Whitney Benton is a 53 year old female with a past psychiatric history significant for her depressive disorder (severe recurrent, with psychotic features) and generalized anxiety disorder who presents to [facility] for follow-up and medication management.  Patient was last seen by Dr. Harl on 05/27/2024.  During her last encounter, patient was being managed on the following psychiatric medications:  Buspirone  15 mg 3 times daily Olanzapine -Samidorphan (Lybalvi ) 15 mg at bedtime Duloxetine  80 mg daily Gabapentin  300 mg 3 times daily Trazodone  50 mg to 100 mg at bedtime as needed Seroquel  25 mg at bedtime Hydroxyzine  50 mg 3 times daily as needed  Patient presents to the encounter stating that they are still not able to sleep.  She  reports that a sleep study was recently performed with the patient being diagnosed with sleep apnea.  She reports that she was diagnosed with sleep apnea in July and will need to go back for more testing with the pulmonologist.  Patient believes that she may be fitted with a CPAP machine once all testing has been completed.  Patient continues to endorse depression and rates her depression a 10 out of 10 with 10 being most severe.  Patient endorses depressive episodes every day.  Patient endorses the following depressive symptoms: crying spells, feelings of sadness, lack of motivation, decreased concentration, decreased energy, irritability, feelings of guilt/worthlessness, and hopelessness.  Patient also endorses anxiety and rates her anxiety a 10 out of 10.  Patient denies any other stressors at this time.  A PHQ-9 screen was performed with the patient scoring a 24.  A GAD-7 screen was also performed with the patient scoring a 21.  Patient is alert and oriented x 4, calm, cooperative, and fully engaged in conversation during the encounter.  Patient endorses okay mood.  Patient exhibits depressed mood with appropriate affect.  Patient denies suicidal or homicidal ideations.  She further denies auditory or visual hallucinations and does not appear to be responding to internal/external stimuli.  Patient endorses poor sleep stating that she wakes up every hour.  Patient endorses good appetite and eats on average 3 meals per day.  Patient denies alcohol consumption, tobacco use, or illicit drug use.  Visit Diagnosis:    ICD-10-CM   1. Severe recurrent major depressive disorder with psychotic features (HCC)  F33.3 busPIRone  (BUSPAR ) 30 MG tablet  OLANZapine -Samidorphan (LYBALVI ) 15-10 MG TABS    QUEtiapine  (SEROQUEL ) 25 MG tablet    gabapentin  (NEURONTIN ) 300 MG capsule    traZODone  (DESYREL ) 150 MG tablet    DULoxetine  (CYMBALTA ) 60 MG capsule    2. Generalized anxiety disorder  F41.1 busPIRone  (BUSPAR )  30 MG tablet    gabapentin  (NEURONTIN ) 300 MG capsule    hydrOXYzine  (ATARAX ) 50 MG tablet    traZODone  (DESYREL ) 150 MG tablet    DULoxetine  (CYMBALTA ) 60 MG capsule      Past Psychiatric History:  Cocaine use Marijuana use Anxiety Depression   Past Medical History:  Past Medical History:  Diagnosis Date   Allergy    Anxiety    Arthritis    Asthma    Depression    Diabetes mellitus without complication (HCC)    GERD (gastroesophageal reflux disease)    Glaucoma suspect    Hyperlipidemia    Trichomonas infection     Past Surgical History:  Procedure Laterality Date   CESAREAN SECTION     UPPER GASTROINTESTINAL ENDOSCOPY     VAGINAL HYSTERECTOMY  12/23/2004   Fibroids, menorrhagia, benign pathology    Family Psychiatric History:  Mother - schizophrenia and bipolar disorder   Family History:  Family History  Problem Relation Age of Onset   Diabetes Mother    Mental illness Mother    Depression Mother    Hypertension Mother    Colon cancer Father 58   Hypertension Father    Diabetes Father    Colon cancer Paternal Grandmother    Esophageal cancer Neg Hx    Stomach cancer Neg Hx    Rectal cancer Neg Hx    Asthma Neg Hx     Social History:  Social History   Socioeconomic History   Marital status: Significant Other    Spouse name: Not on file   Number of children: 3   Years of education: 14   Highest education level: Not on file  Occupational History   Not on file  Tobacco Use   Smoking status: Former    Current packs/day: 0.00    Average packs/day: 0.5 packs/day for 13.0 years (6.5 ttl pk-yrs)    Types: Cigarettes    Start date: 2008    Quit date: 2021    Years since quitting: 4.6    Passive exposure: Past   Smokeless tobacco: Never  Vaping Use   Vaping status: Never Used  Substance and Sexual Activity   Alcohol use: Not Currently    Comment: occasional   Drug use: Yes    Types: Marijuana    Comment: occ   Sexual activity: Yes    Birth  control/protection: Surgical  Other Topics Concern   Not on file  Social History Narrative   Patient lives at home with mother and father , one story    Patient has 3 children    Patient is single   Patient has 14 years of education    Patient is right handed    Caffeine none   Social Drivers of Corporate investment banker Strain: High Risk (05/26/2024)   Overall Financial Resource Strain (CARDIA)    Difficulty of Paying Living Expenses: Hard  Food Insecurity: Food Insecurity Present (05/26/2024)   Hunger Vital Sign    Worried About Running Out of Food in the Last Year: Often true    Ran Out of Food in the Last Year: Often true  Transportation Needs: No Transportation Needs (05/26/2024)   PRAPARE -  Administrator, Civil Service (Medical): No    Lack of Transportation (Non-Medical): No  Recent Concern: Transportation Needs - Unmet Transportation Needs (05/11/2024)   PRAPARE - Transportation    Lack of Transportation (Medical): Yes    Lack of Transportation (Non-Medical): Yes  Physical Activity: Inactive (05/11/2024)   Exercise Vital Sign    Days of Exercise per Week: 0 days    Minutes of Exercise per Session: 0 min  Stress: Stress Concern Present (05/11/2024)   [employer] of Occupational Health - Occupational Stress Questionnaire    Feeling of Stress : Very much  Social Connections: Unknown (05/11/2024)   Social Connection and Isolation Panel    Frequency of Communication with Friends and Family: More than three times a week    Frequency of Social Gatherings with Friends and Family: More than three times a week    Attends Religious Services: More than 4 times per year    Active Member of [employer] or Organizations: Yes    Attends Banker Meetings: 1 to 4 times per year    Marital Status: Patient declined    Allergies:  Allergies  Allergen Reactions   Aspirin Hives   Trulicity  [Dulaglutide ] Other (See Comments)    DC'd due to chronic gastric and  abdominal pain with nausea   Oxycodone  Nausea And Vomiting    Metabolic Disorder Labs: Lab Results  Component Value Date   HGBA1C 10.0 (H) 05/01/2024   MPG 240.3 05/01/2024   MPG 235 02/18/2024   No results found for: PROLACTIN Lab Results  Component Value Date   CHOL 169 03/31/2024   TRIG 142 03/31/2024   HDL 40 (L) 03/31/2024   CHOLHDL 4.2 03/31/2024   VLDL 19 11/14/2014   LDLCALC 105 (H) 03/31/2024   LDLCALC 115 (H) 03/23/2024   Lab Results  Component Value Date   TSH 2.220 03/23/2024   TSH 2.460 04/30/2021    Therapeutic Level Labs: No results found for: LITHIUM No results found for: VALPROATE No results found for: CBMZ  Current Medications: Current Outpatient Medications  Medication Sig Dispense Refill   Accu-Chek Softclix Lancets lancets Use to check blood sugar 3 times daily 100 each 6   AIMOVIG  140 MG/ML SOAJ SMARTSIG:140 Milligram(s) SUB-Q Every 4 Weeks     albuterol  (VENTOLIN  HFA) 108 (90 Base) MCG/ACT inhaler Inhale 2 puffs into the lungs every 6 (six) hours as needed for wheezing or shortness of breath (Cough). 18 g 0   amLODipine  (NORVASC ) 10 MG tablet Take 1 tablet (10 mg total) by mouth daily. 90 tablet 1   atorvastatin  (LIPITOR) 40 MG tablet Take 1 tablet (40 mg total) by mouth daily. 90 tablet 2   Blood Glucose Monitoring Suppl (ACCU-CHEK GUIDE) w/Device KIT Use to check blood sugar 3 times daily. 1 kit 0   Blood Pressure Monitoring (BLOOD PRESSURE CUFF) MISC 1 each by Does not apply route daily. Please provide Large cuff 1 each 0   busPIRone  (BUSPAR ) 30 MG tablet Take 1 tablet (30 mg total) by mouth 2 (two) times daily. 60 tablet 3   cephALEXin  (KEFLEX ) 500 MG capsule Take 500 mg by mouth 2 (two) times daily.     Cholecalciferol  (VITAMIN D3) 10 MCG (400 UNIT) CAPS Take 400 Units by mouth daily.     ciclopirox  (PENLAC ) 8 % solution Apply topically at bedtime. Apply over nail and surrounding skin. Apply daily over previous coat. After seven (7)  days, may remove with alcohol and continue cycle. 6.6  mL 12   Continuous Glucose Sensor (DEXCOM G7 SENSOR) MISC Change sensor every 10 days. 9 each 3   cyclobenzaprine  (FLEXERIL ) 10 MG tablet Take 1 tablet (10 mg total) by mouth 3 (three) times daily as needed for muscle spasms. 30 tablet 0   cycloSPORINE  (RESTASIS ) 0.05 % ophthalmic emulsion Apply 1 drop into both eyes twice a day 180 each 3   diclofenac  (VOLTAREN ) 50 MG EC tablet Take 1 tablet (50 mg total) by mouth 2 (two) times daily. 30 tablet 1   DULoxetine  (CYMBALTA ) 60 MG capsule Take 1 capsule (60 mg total) by mouth 2 (two) times daily. 30 capsule 3   fluticasone  (FLONASE ) 50 MCG/ACT nasal spray Place 1 spray into both nostrils daily as needed for allergies or rhinitis. 16 g 1   furosemide  (LASIX ) 20 MG tablet Take 1 tablet (20 mg total) by mouth daily as needed (weight gain). (Patient taking differently: Take 20 mg by mouth daily as needed (for swollen feet).) 30 tablet 0   gabapentin  (NEURONTIN ) 300 MG capsule Take 1 capsule (300 mg total) by mouth 3 (three) times daily. 90 capsule 3   Glucagon  (BAQSIMI  ONE PACK) 3 MG/DOSE POWD Place 1 Device into the nose as needed (Low blood sugar with impaired consciousness). 2 each 3   glucose blood (ACCU-CHEK GUIDE) test strip Use to check blood sugar 3 times daily 100 each 6   hydrOXYzine  (ATARAX ) 50 MG tablet Take 1 tablet (50 mg total) by mouth 3 (three) times daily. 90 tablet 3   Insulin  Pen Needle (PEN NEEDLES) 31G X 5 MM MISC Use to inject insulin  three times daily. 100 each 6   insulin  regular human CONCENTRATED (HUMULIN  R U-500 KWIKPEN) 500 UNIT/ML KwikPen Inject 85-95 Units into the skin daily with breakfast AND 75-80 Units daily before lunch AND 65-70 Units daily before supper. Take 30 minutes before meals. 12 mL 3   loratadine  (CLARITIN ) 10 MG tablet Take 1 tablet (10 mg total) by mouth daily as needed for allergies. 30 tablet 3   methocarbamol  (ROBAXIN ) 500 MG tablet Take 1 tablet (500 mg  total) by mouth 2 (two) times daily. 20 tablet 0   metoCLOPramide  (REGLAN ) 10 MG tablet Take 1 tablet (10 mg total) by mouth 3 (three) times daily before meals. 90 tablet 0   metoCLOPramide  (REGLAN ) 5 MG tablet Take 1 tablet (5 mg total) by mouth every 8 (eight) hours as needed for nausea. Dose decrease 90 tablet 3   metoprolol  tartrate (LOPRESSOR ) 25 MG tablet Take 1 tablet (25 mg total) by mouth 2 (two) times daily. 60 tablet 5   mometasone -formoterol  (DULERA) 100-5 MCG/ACT AERO Inhale 2 puffs into the lungs in the morning and at bedtime. 1 each 5   Multiple Vitamins-Minerals (EYE VITAMINS PO) Take 1 capsule by mouth daily.     OLANZapine -Samidorphan (LYBALVI ) 15-10 MG TABS Take 15 mg by mouth at bedtime. 30 tablet 3   omeprazole  (PRILOSEC) 40 MG capsule TAKE 1 CAPSULE BY MOUTH ONCE DAILY 30 MINUTES  BEFORE BREAKFAST 90 capsule 0   ondansetron  (ZOFRAN -ODT) 4 MG disintegrating tablet Take 1 tablet (4 mg total) by mouth every 8 (eight) hours as needed for nausea or vomiting. 20 tablet 0   QUEtiapine  (SEROQUEL ) 25 MG tablet Take 1 tablet (25 mg total) by mouth at bedtime as needed. 30 tablet 3   SUMAtriptan  (IMITREX ) 100 MG tablet TAKE 1 TABLET EARLIEST ONSET OF MIGRAINE. MAY REPEAT IN 2 HOURS IF HEADACHE PERSISTS OR RECURS. MAXIMUM 2 TABLETS  IN 24 HOURS. 10 tablet 5   traZODone  (DESYREL ) 150 MG tablet Take 1 tablet (150 mg total) by mouth at bedtime as needed for sleep. 30 tablet 3   vitamin B-12 (CYANOCOBALAMIN) 50 MCG tablet Take 50 mcg by mouth daily.     No current facility-administered medications for this visit.     Musculoskeletal: Strength & Muscle Tone: within normal limits Gait & Station: normal Patient leans: N/A  Psychiatric Specialty Exam: Review of Systems  Psychiatric/Behavioral:  Positive for dysphoric mood and sleep disturbance. Negative for decreased concentration, hallucinations, self-injury and suicidal ideas. The patient is nervous/anxious. The patient is not  hyperactive.     Blood pressure 130/79, pulse (!) 106, temperature 98.7 F (37.1 C), temperature source Oral, height 5' 3 (1.6 m), weight 248 lb (112.5 kg), SpO2 97%.Body mass index is 43.93 kg/m.  General Appearance: Casual  Eye Contact:  Good  Speech:  Clear and Coherent and Normal Rate  Volume:  Normal  Mood:  Anxious and Depressed  Affect:  Congruent  Thought Process:  Coherent, Goal Directed, and Descriptions of Associations: Intact  Orientation:  Full (Time, Place, and Person)  Thought Content: WDL   Suicidal Thoughts:  No  Homicidal Thoughts:  No  Memory:  Immediate;   Good Recent;   Good Remote;   Good  Judgement:  Good  Insight:  Good  Psychomotor Activity:  Normal  Concentration:  Concentration: Good and Attention Span: Good  Recall:  Good  Fund of Knowledge: Good  Language: Good  Akathisia:  No  Handed:  Right  AIMS (if indicated): done; 0  Assets:  Communication Skills Desire for Improvement Housing Intimacy Physical Health Social Support  ADL's:  Intact  Cognition: WNL  Sleep:  Poor   Screenings: AIMS    Flowsheet Row Clinical Support from 07/28/2024 in [facility] Clinical Support from 01/27/2024 in [facility]  AIMS Total Score 0 0   GAD-7    Flowsheet Row Clinical Support from 07/28/2024 in [facility] Clinical Support from 05/27/2024 in [facility] Office Visit from 05/11/2024 in [facility] [place] Video Visit from 03/30/2024 in [facility] Clinical Support from 01/27/2024 in [facility]  Total GAD-7 Score 21 21 21 21 21    PHQ2-9    Flowsheet Row Clinical Support from 07/28/2024 in [facility] Clinical Support from 05/27/2024 in [facility] Office Visit from 05/11/2024 in [facility] [place] [facility] [place] Video Visit from 03/30/2024 in [facility] Clinical Support from 01/27/2024 in [facility]  PHQ-2 Total Score 6 6 6 6 6   PHQ-9 Total Score 24 24 21 23 24    Flowsheet Row Clinical Support from 07/28/2024 in [facility] UC from 07/15/2024 in [facility] UC from 07/08/2024 in [facility] at [facility] RISK CATEGORY No Risk No Risk No Risk    Assessment and Plan:   Whitney Benton is a 53 year old female with a past psychiatric history significant for her depressive disorder (severe recurrent, with psychotic features) and generalized anxiety disorder who presents to [facility] for follow-up and medication management.  Patient presents to the encounter stating that she continues to  take her medications regularly and denies experiencing any adverse side effects.  An aims assessment was performed with the patient scoring a 0.  Despite taking her medications regularly, patient reports that she still continues to experience issues with sleep.  She informed provider that she was recently diagnosed with sleep apnea after and undergoing testing.  Patient states that she still has some testing that she must undergo, but believes that she will be given a CPAP machine.  Provider informed patient that her sleep issues may be attributed to her recent diagnoses of sleep apnea and that medication management may not be as effective in managing her issues with sleep due to her diagnosis of sleep apnea.  Provider informed patient that she may receive quality sleep when she is able to receive her CPAP machine.  Since patient does not have her CPAP machine, provider recommended increasing patient's trazodone  dosage from 100 mg to 150 mg at bedtime for the management of her sleep.  Patient was  agreeable to recommendation.  Patient continues to endorse worsening depression and anxiety but denies any new stressors at this time.  A PHQ-9 screen was performed with the patient scoring a 24.  A GAD-7 screen was also performed with the patient scoring a 21.  Provider recommended increasing patient's Cymbalta  from 80 mg to 60 mg 2 times daily for the management of her depression and anxiety.  Patient was agreeable to recommendation.  Patient's medications to be prescribed to pharmacy of choice.  Patient's labs and EKG are up to date.  A [place] Suicide Severity Rating Scale was performed with the patient being considered no risk.  Patient denies suicidal ideations and is able to contract for safety following the conclusion of the encounter.  Collaboration of Care: Collaboration of Care: Medication Management AEB provider managing patient's psychiatric medications, Primary Care Provider AEB patient being followed by internal medicine, Psychiatrist AEB patient being followed by a mental health provider at the facility, Other provider involved in patient's care AEB patient being seen by neurology, podiatry, pulmonology, and endocrinology, and Referral or follow-up with counselor/therapist AEB patient being seen by licensed clinical social worker at this facility  Patient/Guardian was advised Release of Information must be obtained prior to any record release in order to collaborate their care with an outside provider. Patient/Guardian was advised if they have not already done so to contact the registration department to sign all necessary forms in order for us  to release information regarding their care.   Consent: Patient/Guardian gives verbal consent for treatment and assignment of benefits for services provided during this visit. Patient/Guardian expressed understanding and agreed to proceed.   1. Severe recurrent major depressive disorder with psychotic features (HCC)  - busPIRone  (BUSPAR ) 30 MG  tablet; Take 1 tablet (30 mg total) by mouth 2 (two) times daily.  Dispense: 60 tablet; Refill: 3 - OLANZapine -Samidorphan (LYBALVI ) 15-10 MG TABS; Take 15 mg by mouth at bedtime.  Dispense: 30 tablet; Refill: 3 - QUEtiapine  (SEROQUEL ) 25 MG tablet; Take 1 tablet (25 mg total) by mouth at bedtime as needed.  Dispense: 30 tablet; Refill: 3 - gabapentin  (NEURONTIN ) 300 MG capsule; Take 1 capsule (300 mg total) by mouth 3 (three) times daily.  Dispense: 90 capsule; Refill: 3 - traZODone  (DESYREL ) 150 MG tablet; Take 1 tablet (150 mg total) by mouth at bedtime as needed for sleep.  Dispense: 30 tablet; Refill: 3 - DULoxetine  (CYMBALTA ) 60 MG capsule; Take 1 capsule (60 mg total) by mouth 2 (two) times daily.  Dispense: 30 capsule; Refill: 3  2. Generalized anxiety disorder  - busPIRone  (BUSPAR ) 30 MG tablet; Take 1 tablet (30 mg total) by mouth 2 (two) times daily.  Dispense: 60 tablet; Refill: 3 - gabapentin  (NEURONTIN ) 300 MG capsule; Take 1 capsule (300 mg total) by mouth 3 (three) times daily.  Dispense: 90 capsule; Refill: 3 - hydrOXYzine  (ATARAX ) 50 MG tablet; Take 1 tablet (50 mg total) by mouth 3 (three) times daily.  Dispense: 90 tablet; Refill: 3 - traZODone  (DESYREL ) 150 MG tablet; Take 1 tablet (150 mg total) by mouth at bedtime as needed for sleep.  Dispense: 30 tablet; Refill: 3 - DULoxetine  (CYMBALTA ) 60 MG capsule; Take 1 capsule (60 mg total) by mouth 2 (two) times daily.  Dispense: 30 capsule; Refill: 3  Patient to follow up in 7 weeks Provider spent a total 21 minutes with the patient/reviewing the patient's chart  Whitney FORBES Bolster, PA 07/28/2024, 10:00 AM

## 2024-07-28 NOTE — Patient Instructions (Addendum)
 Will recommend the following: U500 110 units before break fast and 70 units before supper, 30 min before meals (skip lunch dose) Increase by 5 units every 2 days for morning dose until 2 hour after break fast blood sugar is down to 150)

## 2024-07-28 NOTE — Progress Notes (Signed)
 Outpatient Endocrinology Note [name], MD  07/28/24   PRAIRIE STENBERG Nov 01, 1971 [id number]  Referring Provider: [name], MD Primary Care Provider: [name], MD Reason for consultation: Subjective   Assessment & Plan  Diagnoses and all orders for this visit:  Uncontrolled type 1 diabetes mellitus with hyperglycemia (HCC) -     Microalbumin / creatinine urine ratio -     Lipid panel  Pure hypercholesterolemia     Diabetes Type I complicated by hyperglycemia, c-peptide -ve, GAD Ab + Lab Results  Component Value Date   GFR 78.96 10/09/2022   Hba1c goal less than 7, current Hba1c is  Lab Results  Component Value Date   HGBA1C 10.0 (H) 05/01/2024   Will recommend the following: Will recommend the following: U500 110 units before break fast and 70 units before supper, 30 min before meals (skip lunch dose) Increase by 5 units every 2 days for morning dose until 2 hour after break fast blood sugar is down to 150) Decrease by 5 units if BG is <70  Has DexCom G7  Patient experiencing  personal life issues-extensively counseled   Stopped Metformin  XR 500 mg bid - max tolerated dose. Patient did not experience any benefit from, only GI S/E  Changed to DexCom G7 from [place] 3 per pt request as herlene keeps falling off, pt unable to afford skin tac Stopped Toujeo  78 units qam and Humalog  26 units tidac 15 min before meals Was taken off of Trulicity  due to stomach issues  No known contraindications/side effects to any of above medications Glucagon  discussed and prescribed with refills on 11/24/23  -Last LD and Tg are as follows: Lab Results  Component Value Date   LDLCALC 105 (H) 03/31/2024    Lab Results  Component Value Date   TRIG 142 03/31/2024   -On atorvastatin  40 mg every day, LDL was 105 -04/06/24: started rosuvastatin  40 mg every day -Follow low fat diet and exercise   -Blood pressure goal <140/90 - Microalbumin/creatinine goal is  < 30 -Last MA/Cr is as follows: Lab Results  Component Value Date   MICROALBUR 0.6 11/17/2023   -not on ACE/ARB  -diet changes including salt restriction -limit eating outside -counseled BP targets per standards of diabetes care -uncontrolled blood pressure can lead to retinopathy, nephropathy and cardiovascular and atherosclerotic heart disease  Reviewed and counseled on: -A1C target -Blood sugar targets -Complications of uncontrolled diabetes  -Checking blood sugar before meals and bedtime and bring log next visit -All medications with mechanism of action and side effects -Hypoglycemia management: rule of 15's, Glucagon  Emergency Kit and medical alert ID -low-carb low-fat plate-method diet -At least 20 minutes of physical activity per day -Annual dilated retinal eye exam and foot exam -compliance and follow up needs -follow up as scheduled or earlier if problem gets worse  Call if blood sugar is less than 70 or consistently above 250    Take a 15 gm snack of carbohydrate at bedtime before you go to sleep if your blood sugar is less than 100.    If you are going to fast after midnight for a test or procedure, ask your physician for instructions on how to reduce/decrease your insulin  dose.    Call if blood sugar is less than 70 or consistently above 250  -Treating a low sugar by rule of 15  (15 gms of sugar every 15 min until sugar is more than 70) If you feel your sugar is low, test  your sugar to be sure If your sugar is low (less than 70), then take 15 grams of a fast acting Carbohydrate (3-4 glucose tablets or glucose gel or 4 ounces of juice or regular soda) Recheck your sugar 15 min after treating low to make sure it is more than 70 If sugar is still less than 70, treat again with 15 grams of carbohydrate          Don't drive the hour of hypoglycemia  If unconscious/unable to eat or drink by mouth, use glucagon  injection or nasal spray baqsimi  and call 911. Can repeat  again in 15 min if still unconscious.  Return in about 27 days (around 08/24/2024) for double book at 8:40 am, visit and 8 am labs the same day.   I have reviewed current medications, nurse's notes, allergies, vital signs, past medical and surgical history, family medical history, and social history for this encounter. Counseled patient on symptoms, examination findings, lab findings, imaging results, treatment decisions and monitoring and prognosis. The patient understood the recommendations and agrees with the treatment plan. All questions regarding treatment plan were fully answered.  [name], MD  07/28/24    History of Present Illness ANNASOPHIA CROCKER is a 53 y.o. year old female who presents for follow up of Type I diabetes mellitus.  ABIR EROH was first diagnosed in 2020.   Diabetes education +  Home diabetes regimen: U500 80-90 units before break fast, 75-80 units before lunch and 80 units before supper, 30 min before meals  Previously on, Toujeo  72 units qam Humalog  24 units tidac 15 min before meals Metformin  XR 500 mg bid - gets diarrhea   COMPLICATIONS -  MI/Stroke -  retinopathy -  neuropathy -  nephropathy  BLOOD SUGAR DATA CGM interpretation: At today's visit, we reviewed her CGM downloads. The full report is scanned in the media. Reviewing the CGM trends, BG are elevated in daytime and low in evening/overnight.   Physical Exam  BP (!) 140/80   Pulse 85   Ht 5' 3 (1.6 m)   Wt 248 lb (112.5 kg)   SpO2 96%   BMI 43.93 kg/m    Constitutional: well developed, well nourished Head: normocephalic, atraumatic Eyes: sclera anicteric, no redness Neck: supple Lungs: normal respiratory effort Neurology: alert and oriented Skin: dry, no appreciable rashes Musculoskeletal: no appreciable defects Psychiatric: normal mood and affect Diabetic Foot Exam - Simple   No data filed      Current Medications Patient's Medications  New Prescriptions   No  medications on file  Previous Medications   ACCU-CHEK SOFTCLIX LANCETS LANCETS    Use to check blood sugar 3 times daily   AIMOVIG  140 MG/ML SOAJ    SMARTSIG:140 Milligram(s) SUB-Q Every 4 Weeks   ALBUTEROL  (VENTOLIN  HFA) 108 (90 BASE) MCG/ACT INHALER    Inhale 2 puffs into the lungs every 6 (six) hours as needed for wheezing or shortness of breath (Cough).   AMLODIPINE  (NORVASC ) 10 MG TABLET    Take 1 tablet (10 mg total) by mouth daily.   ATORVASTATIN  (LIPITOR) 40 MG TABLET    Take 1 tablet (40 mg total) by mouth daily.   BLOOD GLUCOSE MONITORING SUPPL (ACCU-CHEK GUIDE) W/DEVICE KIT    Use to check blood sugar 3 times daily.   BLOOD PRESSURE MONITORING (BLOOD PRESSURE CUFF) MISC    1 each by Does not apply route daily. Please provide Large cuff   BUSPIRONE  (BUSPAR ) 15 MG TABLET  Take 1 tablet (15 mg total) by mouth 3 (three) times daily.   CEPHALEXIN  (KEFLEX ) 500 MG CAPSULE    Take 500 mg by mouth 2 (two) times daily.   CHOLECALCIFEROL  (VITAMIN D3) 10 MCG (400 UNIT) CAPS    Take 400 Units by mouth daily.   CICLOPIROX  (PENLAC ) 8 % SOLUTION    Apply topically at bedtime. Apply over nail and surrounding skin. Apply daily over previous coat. After seven (7) days, may remove with alcohol and continue cycle.   CONTINUOUS GLUCOSE SENSOR (DEXCOM G7 SENSOR) MISC    Change sensor every 10 days.   CYCLOBENZAPRINE  (FLEXERIL ) 10 MG TABLET    Take 1 tablet (10 mg total) by mouth 3 (three) times daily as needed for muscle spasms.   CYCLOSPORINE  (RESTASIS ) 0.05 % OPHTHALMIC EMULSION    Apply 1 drop into both eyes twice a day   DICLOFENAC  (VOLTAREN ) 50 MG EC TABLET    Take 1 tablet (50 mg total) by mouth 2 (two) times daily.   DULOXETINE  (CYMBALTA ) 20 MG CAPSULE    Take 1 capsule (20 mg total) by mouth daily.   DULOXETINE  (CYMBALTA ) 60 MG CAPSULE    Take 1 capsule (60 mg total) by mouth daily.   FLUTICASONE  (FLONASE ) 50 MCG/ACT NASAL SPRAY    Place 1 spray into both nostrils daily as needed for allergies or  rhinitis.   FUROSEMIDE  (LASIX ) 20 MG TABLET    Take 1 tablet (20 mg total) by mouth daily as needed (weight gain).   GABAPENTIN  (NEURONTIN ) 300 MG CAPSULE    Take 1 capsule (300 mg total) by mouth 3 (three) times daily.   GLUCAGON  (BAQSIMI  ONE PACK) 3 MG/DOSE POWD    Place 1 Device into the nose as needed (Low blood sugar with impaired consciousness).   GLUCOSE BLOOD (ACCU-CHEK GUIDE) TEST STRIP    Use to check blood sugar 3 times daily   HYDROXYZINE  (ATARAX ) 50 MG TABLET    Take 1 tablet (50 mg total) by mouth 3 (three) times daily.   INSULIN  PEN NEEDLE (PEN NEEDLES) 31G X 5 MM MISC    Use to inject insulin  three times daily.   INSULIN  REGULAR HUMAN CONCENTRATED (HUMULIN  R U-500 KWIKPEN) 500 UNIT/ML KWIKPEN    Inject 85-95 Units into the skin daily with breakfast AND 75-80 Units daily before lunch AND 65-70 Units daily before supper. Take 30 minutes before meals.   LORATADINE  (CLARITIN ) 10 MG TABLET    Take 1 tablet (10 mg total) by mouth daily as needed for allergies.   METHOCARBAMOL  (ROBAXIN ) 500 MG TABLET    Take 1 tablet (500 mg total) by mouth 2 (two) times daily.   METOCLOPRAMIDE  (REGLAN ) 10 MG TABLET    Take 1 tablet (10 mg total) by mouth 3 (three) times daily before meals.   METOCLOPRAMIDE  (REGLAN ) 5 MG TABLET    Take 1 tablet (5 mg total) by mouth every 8 (eight) hours as needed for nausea. Dose decrease   METOPROLOL  TARTRATE (LOPRESSOR ) 25 MG TABLET    Take 1 tablet (25 mg total) by mouth 2 (two) times daily.   MOMETASONE -FORMOTEROL  (DULERA) 100-5 MCG/ACT AERO    Inhale 2 puffs into the lungs in the morning and at bedtime.   MULTIPLE VITAMINS-MINERALS (EYE VITAMINS PO)    Take 1 capsule by mouth daily.   OLANZAPINE -SAMIDORPHAN (LYBALVI ) 15-10 MG TABS    Take 15 mg by mouth at bedtime.   OMEPRAZOLE  (PRILOSEC) 40 MG CAPSULE    TAKE 1 CAPSULE  BY MOUTH ONCE DAILY 30 MINUTES  BEFORE BREAKFAST   ONDANSETRON  (ZOFRAN -ODT) 4 MG DISINTEGRATING TABLET    Take 1 tablet (4 mg total) by mouth every 8  (eight) hours as needed for nausea or vomiting.   QUETIAPINE  (SEROQUEL ) 25 MG TABLET    Take 1 tablet (25 mg total) by mouth at bedtime as needed.   SUMATRIPTAN  (IMITREX ) 100 MG TABLET    TAKE 1 TABLET EARLIEST ONSET OF MIGRAINE. MAY REPEAT IN 2 HOURS IF HEADACHE PERSISTS OR RECURS. MAXIMUM 2 TABLETS IN 24 HOURS.   TRAZODONE  (DESYREL ) 50 MG TABLET    Take 1-2 tablets (50-100 mg total) by mouth at bedtime as needed for sleep.   VITAMIN B-12 (CYANOCOBALAMIN) 50 MCG TABLET    Take 50 mcg by mouth daily.  Modified Medications   No medications on file  Discontinued Medications   No medications on file    Allergies Allergies  Allergen Reactions   Aspirin Hives   Trulicity  [Dulaglutide ] Other (See Comments)    DC'd due to chronic gastric and abdominal pain with nausea   Oxycodone  Nausea And Vomiting    Past Medical History Past Medical History:  Diagnosis Date   Allergy    Anxiety    Arthritis    Asthma    Depression    Diabetes mellitus without complication (HCC)    GERD (gastroesophageal reflux disease)    Glaucoma suspect    Hyperlipidemia    Trichomonas infection     Past Surgical History Past Surgical History:  Procedure Laterality Date   CESAREAN SECTION     UPPER GASTROINTESTINAL ENDOSCOPY     VAGINAL HYSTERECTOMY  12/23/2004   Fibroids, menorrhagia, benign pathology    Family History family history includes Colon cancer in her paternal grandmother; Colon cancer (age of onset: 34) in her father; Depression in her mother; Diabetes in her father and mother; Hypertension in her father and mother; Mental illness in her mother.  Social History Social History   Socioeconomic History   Marital status: Significant Other    Spouse name: Not on file   Number of children: 3   Years of education: 14   Highest education level: Not on file  Occupational History   Not on file  Tobacco Use   Smoking status: Former    Current packs/day: 0.00    Average packs/day: 0.5  packs/day for 13.0 years (6.5 ttl pk-yrs)    Types: Cigarettes    Start date: 2008    Quit date: 2021    Years since quitting: 4.5    Passive exposure: Past   Smokeless tobacco: Never  Vaping Use   Vaping status: Never Used  Substance and Sexual Activity   Alcohol use: Not Currently    Comment: occasional   Drug use: Yes    Types: Marijuana    Comment: occ   Sexual activity: Yes    Birth control/protection: Surgical  Other Topics Concern   Not on file  Social History Narrative   Patient lives at home with mother and father , one story    Patient has 3 children    Patient is single   Patient has 14 years of education    Patient is right handed    Caffeine none   Social Drivers of Health   Financial Resource Strain: High Risk (05/26/2024)   Overall Financial Resource Strain (CARDIA)    Difficulty of Paying Living Expenses: Hard  Food Insecurity: Food Insecurity Present (05/26/2024)   Hunger Vital Sign  Worried About Programme researcher, broadcasting/film/video in the Last Year: Often true    [employer] of Food in the Last Year: Often true  Transportation Needs: No Transportation Needs (05/26/2024)   PRAPARE - Administrator, Civil Service (Medical): No    Lack of Transportation (Non-Medical): No  Recent Concern: Transportation Needs - Unmet Transportation Needs (05/11/2024)   PRAPARE - Transportation    Lack of Transportation (Medical): Yes    Lack of Transportation (Non-Medical): Yes  Physical Activity: Inactive (05/11/2024)   Exercise Vital Sign    Days of Exercise per Week: 0 days    Minutes of Exercise per Session: 0 min  Stress: Stress Concern Present (05/11/2024)   [employer] of Occupational Health - Occupational Stress Questionnaire    Feeling of Stress : Very much  Social Connections: Unknown (05/11/2024)   Social Connection and Isolation Panel    Frequency of Communication with Friends and Family: More than three times a week    Frequency of Social Gatherings with Friends  and Family: More than three times a week    Attends Religious Services: More than 4 times per year    Active Member of Clubs or Organizations: Yes    Attends Banker Meetings: 1 to 4 times per year    Marital Status: Patient declined  Intimate Partner Violence: Not At Risk (05/11/2024)   Humiliation, Afraid, Rape, and Kick questionnaire    Fear of Current or Ex-Partner: No    Emotionally Abused: No    Physically Abused: No    Sexually Abused: No    Lab Results  Component Value Date   HGBA1C 10.0 (H) 05/01/2024   HGBA1C 9.8 (H) 02/18/2024   HGBA1C 10.3 (H) 12/23/2023   Lab Results  Component Value Date   CHOL 169 03/31/2024   Lab Results  Component Value Date   HDL 40 (L) 03/31/2024   Lab Results  Component Value Date   LDLCALC 105 (H) 03/31/2024   Lab Results  Component Value Date   TRIG 142 03/31/2024   Lab Results  Component Value Date   CHOLHDL 4.2 03/31/2024   Lab Results  Component Value Date   CREATININE 0.98 05/24/2024   Lab Results  Component Value Date   GFR 78.96 10/09/2022   Lab Results  Component Value Date   MICROALBUR 0.6 11/17/2023      Component Value Date/Time   NA 138 05/24/2024 1057   NA 138 03/23/2024 0934   K 3.6 05/24/2024 1057   CL 102 05/24/2024 1057   CO2 23 05/24/2024 1057   GLUCOSE 328 (H) 05/24/2024 1057   BUN 13 05/24/2024 1057   BUN 10 03/23/2024 0934   CREATININE 0.98 05/24/2024 1057   CREATININE 0.90 11/17/2023 1047   CALCIUM  9.5 05/24/2024 1057   PROT 7.9 05/24/2024 1057   PROT 6.9 03/23/2024 0934   ALBUMIN 4.2 05/24/2024 1057   ALBUMIN 4.3 03/23/2024 0934   AST 26 05/24/2024 1057   ALT 24 05/24/2024 1057   ALKPHOS 108 05/24/2024 1057   BILITOT 0.9 05/24/2024 1057   BILITOT 0.3 03/23/2024 0934   GFRNONAA >60 05/24/2024 1057   GFRAA >60 08/10/2020 2100      Latest Ref Rng & Units 05/24/2024   10:57 AM 05/01/2024    8:56 AM 04/30/2024   10:17 AM  BMP  Glucose 70 - 99 mg/dL 671  723  621   BUN 6 -  20 mg/dL 13  8  9  Creatinine 0.44 - 1.00 mg/dL 9.01  9.18  9.04   Sodium 135 - 145 mmol/L 138  139  137   Potassium 3.5 - 5.1 mmol/L 3.6  3.7  3.7   Chloride 98 - 111 mmol/L 102  107  98   CO2 22 - 32 mmol/L 23  24  26    Calcium  8.9 - 10.3 mg/dL 9.5  8.5  9.1        Component Value Date/Time   WBC 10.1 05/24/2024 1057   RBC 5.03 05/24/2024 1057   HGB 13.0 05/24/2024 1057   HGB 13.3 12/12/2022 1036   HCT 42.0 05/24/2024 1057   HCT 40.4 12/12/2022 1036   PLT 430 (H) 05/24/2024 1057   PLT 436 12/12/2022 1036   MCV 83.5 05/24/2024 1057   MCV 86 12/12/2022 1036   MCH 25.8 (L) 05/24/2024 1057   MCHC 31.0 05/24/2024 1057   RDW 15.9 (H) 05/24/2024 1057   RDW 12.9 12/12/2022 1036   LYMPHSABS 1.1 06/20/2023 1122   LYMPHSABS 2.4 07/14/2019 1058   MONOABS 0.6 06/20/2023 1122   EOSABS 0.0 06/20/2023 1122   EOSABS 0.1 07/14/2019 1058   BASOSABS 0.0 06/20/2023 1122   BASOSABS 0.0 07/14/2019 1058     Parts of this note may have been dictated using voice recognition software. There may be variances in spelling and vocabulary which are unintentional. Not all errors are proofread. Please notify the dino if any discrepancies are noted or if the meaning of any statement is not clear.

## 2024-07-29 ENCOUNTER — Encounter: Payer: Self-pay | Admitting: "Endocrinology

## 2024-07-29 ENCOUNTER — Encounter (HOSPITAL_COMMUNITY): Payer: Self-pay | Admitting: Physician Assistant

## 2024-07-30 ENCOUNTER — Ambulatory Visit: Admitting: Orthopaedic Surgery

## 2024-07-30 DIAGNOSIS — G5601 Carpal tunnel syndrome, right upper limb: Secondary | ICD-10-CM

## 2024-07-30 DIAGNOSIS — G5602 Carpal tunnel syndrome, left upper limb: Secondary | ICD-10-CM | POA: Diagnosis not present

## 2024-07-30 NOTE — Progress Notes (Signed)
 Office Visit Note   Patient: Whitney Benton           Date of Birth: 1971/10/25           MRN: [id number] Visit Date: 07/30/2024              Requested by: [name], MD [address] [place],  [place] [address] PCP: [name], MD   Assessment & Plan: Visit Diagnoses:  1. Right carpal tunnel syndrome   2. Left carpal tunnel syndrome     Plan: History of Present Illness Whitney Benton is a 53 year old female with diabetes and gastroparesis who presents with tingling and pain in her hands.  She experiences increased tingling and pain in both hands. A nerve conduction study was performed in 2023. She does not use wrist braces. She manages diabetes actively and has not been hospitalized since May for high blood sugar levels. Her glucose levels have been low recently. There are no current symptoms related to gastroparesis.  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  Assessment and Plan Bilateral carpal tunnel syndrome Moderate to severe confirmed by nerve conduction study. Management limited by poorly controlled diabetes, precluding surgery and cortisone injections. - Prescribed wrist braces for night and optional daytime use. - Advised better glycemic control before surgery. - Instructed to return for evaluation once A1c improves. - Discussed potential surgery post-glycemic control with three-week recovery allowing light hand use.  Follow-Up Instructions: No follow-ups on file.   Orders:  No orders of the defined types were placed in this encounter.  No orders of the defined types were placed in this encounter.     Procedures: No procedures performed   Clinical Data: No additional findings.   Subjective: Chief Complaint  Patient presents with   Other    Bilateral hand pain    HPI  Review of Systems  Constitutional: Negative.   HENT:  Negative.    Eyes: Negative.   Respiratory: Negative.    Cardiovascular: Negative.   Endocrine: Negative.   Musculoskeletal: Negative.   Neurological: Negative.   Hematological: Negative.   Psychiatric/Behavioral: Negative.    All other systems reviewed and are negative.    Objective: Vital Signs: There were no vitals taken for this visit.  Physical Exam Vitals and nursing note reviewed.  Constitutional:      Appearance: She is well-developed.  HENT:     Head: Atraumatic.     Nose: Nose normal.  Eyes:     Extraocular Movements: Extraocular movements intact.  Cardiovascular:     Pulses: Normal pulses.  Pulmonary:     Effort: Pulmonary effort is normal.  Abdominal:     Palpations: Abdomen is soft.  Musculoskeletal:     Cervical back: Neck supple.  Skin:    General: Skin is warm.     Capillary Refill: Capillary refill takes less than 2 seconds.  Neurological:     Mental Status: She is alert. Mental status is at baseline.  Psychiatric:        Behavior: Behavior normal.        Thought Content: Thought content normal.        Judgment: Judgment normal.     Ortho Exam  Specialty Comments:  No specialty comments available.  Imaging: No results found.   PMFS History: Patient Active Problem List   Diagnosis Date Noted   Right  carpal tunnel syndrome 07/30/2024   Left carpal tunnel syndrome 07/30/2024   Uncontrolled type 2 diabetes mellitus with hyperglycemia, with long-term current use of insulin  (HCC) 04/30/2024   Intractable nausea and vomiting 12/23/2023   Diabetic gastroparesis (HCC) 12/22/2023   DKA, type 1 (HCC) 06/20/2023   Essential hypertension 06/20/2023   Vitamin D  deficiency 06/20/2023   Hyperlipidemia 03/06/2023   Primary insomnia 12/27/2022   Gallstone pancreatitis 12/03/2022   Chronic cholecystitis with calculus 12/03/2022   Morbid obesity (HCC) 04/04/2022   Former smoker 04/30/2021   Major depressive disorder, recurrent episode, moderate  (HCC) 04/30/2021   Substance induced mood disorder (HCC) 03/22/2021   Generalized anxiety disorder 03/22/2021   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   DKA (diabetic ketoacidosis) (HCC) 01/23/2021   Glaucoma suspect 04/12/2020   DKA, type 2, not at goal [facility]) 10/02/2019   AKI (acute kidney injury) (HCC) 10/02/2019   Hyperkalemia 10/02/2019   Leukocytosis 10/02/2019   Abnormal LFTs 10/02/2019   Stressful life events affecting family and household 08/06/2019   New onset type 2 diabetes mellitus (HCC) 02/04/2019   Morbid obesity with BMI of 40.0-44.9, adult (HCC) 02/04/2019   Tobacco dependence 02/04/2019   Left arm weakness 12/06/2014   Paresthesias/numbness 12/06/2014   Tobacco abuse 11/14/2014   Depression    Mixed incontinence 06/27/2014   History of TVH in 2006 for fibroids and menorrhagia; benign pathology 09/08/2011   Past Medical History:  Diagnosis Date   Allergy    Anxiety    Arthritis    Asthma    Depression    Diabetes mellitus without complication (HCC)    GERD (gastroesophageal reflux disease)    Glaucoma suspect    Hyperlipidemia    Trichomonas infection     Family History  Problem Relation Age of Onset   Diabetes Mother    Mental illness Mother    Depression Mother    Hypertension Mother    Colon cancer Father 63   Hypertension Father    Diabetes Father    Colon cancer Paternal Grandmother    Esophageal cancer Neg Hx    Stomach cancer Neg Hx    Rectal cancer Neg Hx    Asthma Neg Hx     Past Surgical History:  Procedure Laterality Date   CESAREAN SECTION     UPPER GASTROINTESTINAL ENDOSCOPY     VAGINAL HYSTERECTOMY  12/23/2004   Fibroids, menorrhagia, benign pathology   Social History   Occupational History   Not on file  Tobacco Use   Smoking status: Former    Current packs/day: 0.00    Average packs/day: 0.5 packs/day for 13.0 years (6.5 ttl pk-yrs)    Types: Cigarettes    Start date: 2008    Quit date: 2021    Years  since quitting: 4.6    Passive exposure: Past   Smokeless tobacco: Never  Vaping Use   Vaping status: Never Used  Substance and Sexual Activity   Alcohol use: Not Currently    Comment: occasional   Drug use: Yes    Types: Marijuana    Comment: occ   Sexual activity: Yes    Birth control/protection: Surgical

## 2024-08-04 ENCOUNTER — Other Ambulatory Visit: Payer: Self-pay

## 2024-08-09 ENCOUNTER — Ambulatory Visit (INDEPENDENT_AMBULATORY_CARE_PROVIDER_SITE_OTHER): Admitting: Clinical

## 2024-08-09 DIAGNOSIS — F331 Major depressive disorder, recurrent, moderate: Secondary | ICD-10-CM

## 2024-08-09 NOTE — Progress Notes (Signed)
 THERAPIST PROGRESS NOTE Virtual Visit via Video Note  I connected with Whitney Benton on 08/09/2024 at  2:00 PM EDT by a video enabled telemedicine application and verified that I am speaking with the correct person using two identifiers.  Location: Patient: home Provider: office   I discussed the limitations of evaluation and management by telemedicine and the availability of in person appointments. The patient expressed understanding and agreed to proceed.   Follow Up Instructions: I discussed the assessment and treatment plan with the patient. The patient was provided an opportunity to ask questions and all were answered. The patient agreed with the plan and demonstrated an understanding of the instructions.   The patient was advised to call back or seek an in-person evaluation if the symptoms worsen or if the condition fails to improve as anticipated.   Session Time: 30 min  Participation Level: Active  Behavioral Response: CasualAlertEuthymic  Type of Therapy: Individual Therapy  Treatment Goals addressed: Whitney Benton WILL PARTICIPATE IN AT LEAST 80% OF SCHEDULED INDIVIDUAL PSYCHOTHERAPY SESSIONS   ProgressTowards Goals: Progressing  Interventions: CBT and Supportive  Summary:  Whitney Benton is a 53 y.o. female who presents for scheduled appointment oriented x 5, appropriately dressed, and friendly. Client denied hallucinations or delusions. Client reported on today she is doing pretty well. Client reported they had her oldest grandson's birthday party. Client reported it was a nice stay for the whole family. Client reported otherwise she is still not heard anything from [employer]. Client reported she hopes to get an answer this year about their decision.  Client reported her housing and financial means are still a stressor.  Client reported her kids want her to live on her own but the house that she was living in has now been completely occupied by her sister and all of her  children.  Client reported since she has been seeing a temporary doctor while hers has been out on maternity leave her medication was increased but she has not been able to pick up the new prescription from the pharmacy due to not having the money to obtain it. Evidence of progress towards goal: Client reported 1 positive of taking care of herself which has reflected in improvements in her overall health over the past year.  Suicidal/Homicidal: Nowithout intent/plan  Therapist Response:  Therapist began the appointment asking the client how she has been doing since last seen. Therapist engaged using active listening and positive emotional support. Therapist used cbt to engage and ask her about changes that have occurred. Therapist used cbt to teach the client about applying boundaries and prioritizing her needs. Therapist used CBT ask the client to identify her progress with frequency of use with coping skills with continued practice in her daily activity.    Therapist assigned the client homework to practice self care.   Plan: Return again in 4 weeks.  Diagnosis: mdd, recurrent, moderate  Collaboration of Care: Patient refused AEB none requested by the client.  Patient/Guardian was advised Release of Information must be obtained prior to any record release in order to collaborate their care with an outside provider. Patient/Guardian was advised if they have not already done so to contact the registration department to sign all necessary forms in order for us  to release information regarding their care.   Consent: Patient/Guardian gives verbal consent for treatment and assignment of benefits for services provided during this visit. Patient/Guardian expressed understanding and agreed to proceed.   Earle Troiano Y Kartel Wolbert, LCSW 08/09/2024

## 2024-08-11 ENCOUNTER — Other Ambulatory Visit: Payer: Self-pay

## 2024-08-11 MED FILL — Insulin Regular (Human) Soln Pen-Injector 500 Unit/ML: SUBCUTANEOUS | 25 days supply | Qty: 12 | Fill #1 | Status: AC

## 2024-08-12 ENCOUNTER — Ambulatory Visit

## 2024-08-12 ENCOUNTER — Ambulatory Visit: Admitting: Internal Medicine

## 2024-08-12 ENCOUNTER — Encounter: Payer: Self-pay | Admitting: Internal Medicine

## 2024-08-12 VITALS — BP 132/74 | HR 80 | Temp 98.2°F | Ht 63.5 in | Wt 249.0 lb

## 2024-08-12 DIAGNOSIS — G4710 Hypersomnia, unspecified: Secondary | ICD-10-CM | POA: Diagnosis not present

## 2024-08-12 DIAGNOSIS — G4733 Obstructive sleep apnea (adult) (pediatric): Secondary | ICD-10-CM

## 2024-08-12 DIAGNOSIS — Z87891 Personal history of nicotine dependence: Secondary | ICD-10-CM

## 2024-08-12 DIAGNOSIS — R0602 Shortness of breath: Secondary | ICD-10-CM

## 2024-08-12 DIAGNOSIS — J4520 Mild intermittent asthma, uncomplicated: Secondary | ICD-10-CM | POA: Diagnosis not present

## 2024-08-12 DIAGNOSIS — J4540 Moderate persistent asthma, uncomplicated: Secondary | ICD-10-CM

## 2024-08-12 LAB — PULMONARY FUNCTION TEST
DL/VA % pred: 99 %
DL/VA: 4.28 ml/min/mmHg/L
DLCO cor % pred: 75 %
DLCO cor: 15.45 ml/min/mmHg
DLCO unc % pred: 75 %
DLCO unc: 15.45 ml/min/mmHg
FEF 25-75 Post: 1.68 L/s
FEF 25-75 Pre: 1.67 L/s
FEF2575-%Change-Post: 1 %
FEF2575-%Pred-Post: 64 %
FEF2575-%Pred-Pre: 63 %
FEV1-%Change-Post: 1 %
FEV1-%Pred-Post: 64 %
FEV1-%Pred-Pre: 63 %
FEV1-Post: 1.72 L
FEV1-Pre: 1.71 L
FEV1FVC-%Change-Post: 2 %
FEV1FVC-%Pred-Pre: 101 %
FEV6-%Change-Post: -1 %
FEV6-%Pred-Post: 63 %
FEV6-%Pred-Pre: 63 %
FEV6-Post: 2.1 L
FEV6-Pre: 2.12 L
FEV6FVC-%Pred-Post: 102 %
FEV6FVC-%Pred-Pre: 102 %
FVC-%Change-Post: -1 %
FVC-%Pred-Post: 61 %
FVC-%Pred-Pre: 62 %
FVC-Post: 2.1 L
FVC-Pre: 2.12 L
Post FEV1/FVC ratio: 82 %
Post FEV6/FVC ratio: 100 %
Pre FEV1/FVC ratio: 80 %
Pre FEV6/FVC Ratio: 100 %
RV % pred: 92 %
RV: 1.68 L
TLC % pred: 79 %
TLC: 3.96 L

## 2024-08-12 NOTE — Progress Notes (Signed)
 Full PFT performed today.

## 2024-08-12 NOTE — Patient Instructions (Signed)
 Full PFT performed today.

## 2024-08-12 NOTE — Progress Notes (Signed)
 Whitney Benton    [id number]    03-07-1971  Primary Care Physician:Johnson, Barnie NOVAK, MD Date of Appointment: 08/12/2024 Established Patient Visit  Chief complaint:   Chief Complaint  Patient presents with   Medical Management of Chronic Issues   Shortness of Breath    PFT done today. Breathing is some better- still taking Dulera.       HPI: Whitney Benton is a 53 y.o. woman with asthma and mild OSA with excessive daytime sleepiness and fatigue.   Interval Updates: Here for follow up after PFTS which show normal pulmonary function.   She had a sleep study which shwos mild OSA AHI 5/hour Feels a little bit better with dulera, but not dramatically. Hasn't had to use albuterol  rescue inhaler.   Her mother has OSA. She has excessive daytime sleepiness, early morning headaches, snoring and fatigue.   Current Regimen: dulera, prn albuterol  Asthma Triggers: heat, exertion Exacerbations in the last year: none History of hospitalization or intubation: Allergy Testing/rhinitis: yes on flonase  and claritin  GERD: yes on PPI ACT:  Asthma Control Test ACT Total Score  05/24/2024  8:30 AM 13   FeNO: Serum Eos/IgE:   I have reviewed the patient's family social and past medical history and updated as appropriate.   Past Medical History:  Diagnosis Date   Allergy    Anxiety    Arthritis    Asthma    Depression    Diabetes mellitus without complication (HCC)    GERD (gastroesophageal reflux disease)    Glaucoma suspect    Hyperlipidemia    Trichomonas infection     Past Surgical History:  Procedure Laterality Date   CESAREAN SECTION     UPPER GASTROINTESTINAL ENDOSCOPY     VAGINAL HYSTERECTOMY  12/23/2004   Fibroids, menorrhagia, benign pathology    Family History  Problem Relation Age of Onset   Diabetes Mother    Mental illness Mother    Depression Mother    Hypertension Mother    Colon cancer Father 78   Hypertension Father    Diabetes Father     Colon cancer Paternal Grandmother    Esophageal cancer Neg Hx    Stomach cancer Neg Hx    Rectal cancer Neg Hx    Asthma Neg Hx     Social History   Occupational History   Not on file  Tobacco Use   Smoking status: Former    Current packs/day: 0.00    Average packs/day: 0.5 packs/day for 13.0 years (6.5 ttl pk-yrs)    Types: Cigarettes    Start date: 2008    Quit date: 2021    Years since quitting: 4.6    Passive exposure: Past   Smokeless tobacco: Never  Vaping Use   Vaping status: Never Used  Substance and Sexual Activity   Alcohol use: Not Currently    Comment: occasional   Drug use: Yes    Types: Marijuana    Comment: occ   Sexual activity: Yes    Birth control/protection: Surgical     Physical Exam: Blood pressure 132/74, pulse 80, temperature 98.2 F (36.8 C), temperature source Oral, height 5' 3.5 (1.613 m), weight 249 lb (112.9 kg), SpO2 97%.  Gen:      No acute distress, obese ENT:  mallampati IV, no nasal polyps, mucus membranes moist Lungs:    diminished, No increased respiratory effort, symmetric chest wall excursion, clear to auscultation bilaterally, no wheezes or crackles  CV:         Regular rate and rhythm; no murmurs, rubs, or gallops.  No pedal edema   Data Reviewed: Imaging: I have personally reviewed the chest xray June 2024 - no acute cardiopulmonary process  PFTs:     Latest Ref Rng & Units 08/12/2024    7:58 AM  PFT Results  FVC-Pre L 2.12  P  FVC-Predicted Pre % 62  P  FVC-Post L 2.10  P  FVC-Predicted Post % 61  P  Pre FEV1/FVC % % 80  P  Post FEV1/FCV % % 82  P  FEV1-Pre L 1.71  P  FEV1-Predicted Pre % 63  P  FEV1-Post L 1.72  P  DLCO uncorrected ml/min/mmHg 15.45  P  DLCO UNC% % 75  P  DLCO corrected ml/min/mmHg 15.45  P  DLCO COR %Predicted % 75  P  DLVA Predicted % 99  P  TLC L 3.96  P  TLC % Predicted % 79  P  RV % Predicted % 92  P    P Preliminary result   I have personally reviewed the patient's PFTs and normal  pulmonary function  Labs: Lab Results  Component Value Date   WBC 10.1 05/24/2024   HGB 13.0 05/24/2024   HCT 42.0 05/24/2024   MCV 83.5 05/24/2024   PLT 430 (H) 05/24/2024   Lab Results  Component Value Date   NA 138 05/24/2024   K 3.6 05/24/2024   CO2 23 05/24/2024   GLUCOSE 328 (H) 05/24/2024   BUN 13 05/24/2024   CREATININE 0.98 05/24/2024   CALCIUM  9.5 05/24/2024   GFR 78.96 10/09/2022   EGFR 79 03/23/2024   GFRNONAA >60 05/24/2024    Immunization status: Immunization History  Administered Date(s) Administered   Tdap 07/07/2023    External Records Personally Reviewed: sleep study   Assessment:  Mild intermittent asthma OSA  Excessive daytime sleepiness and fatigue  Plan/Recommendations:  Your sleep study shows mild sleep apnea.  However because you are having such severe symptoms of daytime sleepiness, fatigue, headaches I think it is worth treating your sleep apnea.  I will prescribe auto CPAP.    Your pulmonary function testing is normal.  This does not exclude asthma.  I think it still possible you could have asthma.  However you told me the Dulera inhaler is not helping you a ton.  If you want to back off to once a day and monitor your symptoms + with me.  If you completely taper off and your albuterol  use goes up and you know that it may be your asthma that is worsening.  At that point I would recommend going back on the Dulera.  I will see back in 3 months to follow-up on your sleep apnea and asthma.   Return to Care: Return in about 3 months (around 11/12/2024).   Verdon Gore, MD Pulmonary and Critical Care Medicine [facility] Office:[phone]

## 2024-08-12 NOTE — Patient Instructions (Signed)
 It was a pleasure to see you today!  Please schedule follow up with myself in 3 months.  If my schedule is not open yet, we will contact you with a reminder closer to that time. Please call [phone] if you haven't heard from us  a month before, and always call us  sooner if issues or concerns arise. You can also send us  a message through MyChart, but but aware that this is not to be used for urgent issues and it may take up to 5-7 days to receive a reply. Please be aware that you will likely be able to view your results before I have a chance to respond to them. Please give us  5 business days to respond to any non-urgent results.    Your sleep study shows mild sleep apnea.  However because you are having such severe symptoms of daytime sleepiness, fatigue, headaches I think it is worth treating your sleep apnea.  I will prescribe auto CPAP.    Your pulmonary function testing is normal.  This does not exclude asthma.  I think it still possible you could have asthma.  However you told me the Dulera inhaler is not helping you a ton.  If you want to back off to once a day and monitor your symptoms + with me.  If you completely taper off and your albuterol  use goes up and you know that it may be your asthma that is worsening.  At that point I would recommend going back on the Dulera.  I will see back in 3 months to follow-up on your sleep apnea and asthma.

## 2024-08-16 ENCOUNTER — Other Ambulatory Visit: Payer: Self-pay

## 2024-08-17 ENCOUNTER — Other Ambulatory Visit: Payer: Self-pay | Admitting: *Deleted

## 2024-08-17 ENCOUNTER — Other Ambulatory Visit: Payer: Self-pay

## 2024-08-17 NOTE — Patient Instructions (Signed)
 Visit Information  Ms. Littrell was given information about Medicaid Managed Care team care coordination services as a part of their [facility] Community Plan Medicaid benefit.   If you would like to schedule transportation through your [facility], please call the following number at least 2 days in advance of your appointment: [phone]   Rides for urgent appointments can also be made after hours by calling Member Services.  Call the Behavioral Health Crisis Line at [phone], at any time, 24 hours a day, 7 days a week. If you are in danger or need immediate medical attention call 911.   Ms. Luddy - following are the goals we discussed in your visit today:   Goals Addressed             This Visit's Progress    VBCI RN Care Plan-Asthma       Problems:  Chronic Disease Management support and education needs related to Asthma  Goal: Over the next 90 days the Patient will attend all scheduled medical appointments: PCP and Specialist  as evidenced by keeping all scheduled appointments.          continue to work with Medical illustrator and/or Social Worker to address care management and care coordination needs related to Asthma as evidenced by adherence to care management team scheduled appointments     take all medications exactly as prescribed and will call provider for medication related questions as evidenced by compliance    verbalize basic understanding of Asthma disease process and self health management plan as evidenced by verbal explanation, recognize, monitor symptoms and life style changes.   Interventions:   Asthma: Provided patient with basic written and verbal Asthma education on self care/management/and exacerbation prevention Advised patient to track and manage Asthma triggers Advised patient to self assesses Asthma action plan zone and make appointment with provider if in the yellow zone for 48 hours without improvement Provided education  about and advised patient to utilize infection prevention strategies to reduce risk of respiratory infection Discussed the importance of adequate rest and management of fatigue with Asthma Screening for signs and symptoms of depression related to chronic disease state  Assessed social determinant of health barriers   Patient Self-Care Activities:  Attend all scheduled provider appointments Call pharmacy for medication refills 3-7 days in advance of running out of medications Take medications as prescribed    Plan:  Telephone follow up appointment with care management team member scheduled for:  09/03/24          VBCI RN Care Plan-DM       Problems:  Chronic Disease Management support and education needs related to DMII  Goal: Over the next 90 days the Patient will attend all scheduled medical appointments: PCP and Specialist as evidenced by keeping all schedule appointments.         continue to work with Medical illustrator and/or Social Worker to address care management and care coordination needs related to DMII as evidenced by adherence to care management team scheduled appointments     take all medications exactly as prescribed and will call provider for medication related questions as evidenced by compliance.    verbalize basic understanding of DMII disease process and self health management plan as evidenced by verbal explanation,recognize monitor symptoms/life style changes.   Interventions:   Diabetes Interventions: Assessed patient's understanding of A1c goal: <7% Provided education to patient about basic DM disease process Reviewed medications with patient and discussed importance of  medication adherence Counseled on importance of regular laboratory monitoring as prescribed Discussed plans with patient for ongoing care management follow up and provided patient with direct contact information for care management team Provided patient with written educational materials related  to hypo and hyperglycemia and importance of correct treatment Screening for signs and symptoms of depression related to chronic disease state  Assessed social determinant of health barriers Lab Results  Component Value Date   HGBA1C 10.0 (H) 05/01/2024    Patient Self-Care Activities:  Attend all scheduled provider appointments Call pharmacy for medication refills 3-7 days in advance of running out of medications Take medications as prescribed   keep appointment with eye doctor check blood sugar at prescribed times: before meals and at bedtime check feet daily for cuts, sores or redness take the blood sugar log to all doctor visits limit fast food meals to no more than 1 per week wash and dry feet carefully every day  Plan:  Telephone follow up appointment with care management team member scheduled for:  09/03/24 at 9 am             Please see education materials related to Diabetes and Asthma provided by MyChart link.  Patient verbalizes understanding of instructions and care plan provided today and agrees to view in MyChart. Active MyChart status and patient understanding of how to access instructions and care plan via MyChart confirmed with patient.     Telephone follow up appointment with Managed Medicaid care management team member scheduled for: 09/03/24 @ 9 am  Camdyn Laden, RN, BSN, ACM RN Care Manager [facility] [phone]   Following is a copy of your plan of care:  There are no care plans that you recently modified to display for this patient.

## 2024-08-17 NOTE — Patient Outreach (Signed)
 Complex Care Management   Visit Note  08/17/2024  Name:  Whitney Benton MRN: [id number] DOB: Jun 09, 1971  Situation: Referral received for Complex Care Management related to Diabetes with Complications and Asthma I obtained verbal consent from Patient.  Visit completed with Patient  on the phone  Background:   Past Medical History:  Diagnosis Date   Allergy    Anxiety    Arthritis    Asthma    Depression    Diabetes mellitus without complication (HCC)    GERD (gastroesophageal reflux disease)    Glaucoma suspect    Hyperlipidemia    Trichomonas infection     Assessment: Patient Reported Symptoms:  Cognitive Cognitive Status: Alert and oriented to person, place, and time, Insightful and able to interpret abstract concepts, Normal speech and language skills Cognitive/Intellectual Conditions Management [RPT]: None reported or documented in medical history or problem list   Health Maintenance Behaviors: Annual physical exam, Sleep adequate, Healthy diet, Stress management Healing Pattern: Average Health Facilitated by: Prayer/meditation, Rest, Stress management, Healthy diet  Neurological Neurological Review of Symptoms: Headaches (Patient reports that she has migraines.  She reports that she is on medications for migraines.) Neurological Management Strategies: Adequate rest, Medication therapy, Routine screening Neurological Self-Management Outcome: 3 (uncertain)  HEENT HEENT Symptoms Reported:  (Patient reports that she has dry eyes. Reports that she is on prescribed medication that help with the dry eyes.) HEENT Management Strategies: Medication therapy, Routine screening HEENT Self-Management Outcome: 3 (uncertain)    Cardiovascular Cardiovascular Symptoms Reported: No symptoms reported Does patient have uncontrolled Hypertension?: No Cardiovascular Management Strategies: Adequate rest, Routine screening Cardiovascular Self-Management Outcome: 4 (good)  Respiratory  Respiratory Symptoms Reported: Shortness of breath, Wheezing (Patient reports that she has Asthma and uses Inhaler as needed.  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  BS-150 fasting today. Endocrine Self-Management Outcome: 3 (uncertain) Endocrine Comment: Highest-300 and Lowest-64  Gastrointestinal Gastrointestinal Symptoms Reported: No symptoms reported Gastrointestinal Management Strategies: Adequate rest, Medication therapy Gastrointestinal Self-Management Outcome: 4 (good)    Genitourinary Genitourinary Symptoms Reported: No symptoms reported Genitourinary Management Strategies: Adequate rest Genitourinary Self-Management Outcome: 4 (good)  Integumentary Integumentary Symptoms Reported: No symptoms reported Skin Management Strategies: Adequate rest Skin Self-Management Outcome: 4 (good)  Musculoskeletal Musculoskelatal Symptoms Reviewed: Back pain, Joint pain Additional Musculoskeletal Details: Patient reports that she severe tendonitis at the tips of her hands.  She reports that she takes Neurontin .  She also reports having plantar fasciitis.  She is planning to have surgery on her hands and feet once blood sugars are controlled.  Reports pain in her neck, shoulder, and arms.  Reports that she is taking Tylenol  Arthritis.  Reports pain level a 8/10 on pain scale. Musculoskeletal Management Strategies: Medication therapy, Routine screening Musculoskeletal Self-Management Outcome: 2 (bad) Falls in the past year?: No Number of falls in past year: 1 or less Was there an injury with Fall?: No Fall Risk Category  Calculator: 0 Patient Fall Risk Level: Low Fall Risk Fall risk Follow up: Falls evaluation completed  Psychosocial Psychosocial Symptoms Reported: Anxiety - if selected complete GAD, Sadness - if selected complete PHQ 2-9 Behavioral Management Strategies: Coping strategies, Medication therapy, Counseling, Support system  Behavioral Health Self-Management Outcome: 3 (uncertain) Major Change/Loss/Stressor/Fears (CP): Medical condition, family, Death of a loved one (Reports grief of parents passing away in 2022 and 2020.  Reports that she is receiving couseling.) Techniques to Cope with Loss/Stress/Change: Counseling, Support group, Spiritual practice(s), Medication Quality of Family Relationships: helpful, involved, supportive Do you feel physically threatened by others?: No    08/17/2024    PHQ2-9 Depression Screening   Little interest or pleasure in doing things Nearly every day  Feeling down, depressed, or hopeless Nearly every day  PHQ-2 - Total Score 6  Trouble falling or staying asleep, or sleeping too much    Feeling tired or having little energy Nearly every day  Poor appetite or overeating  Nearly every day  Feeling bad about yourself - or that you are a failure or have let yourself or your family down Nearly every day  Trouble concentrating on things, such as reading the newspaper or watching television Nearly every day  Moving or speaking so slowly that other people could have noticed.  Or the opposite - being so fidgety or restless that you have been moving around a lot more than usual Nearly every day  Thoughts that you would be better off dead, or hurting yourself in some way Not at all  PHQ2-9 Total Score    If you checked off any problems, how difficult have these problems made it for you to do your work, take care of things at home, or get along with other people Somewhat difficult  Depression Interventions/Treatment Currently on Treatment    There were no vitals filed for  this visit.  Medications Reviewed Today     Reviewed by Jorja Nichole LABOR, RN (Case Manager) on 08/17/24 at [phone]  Med List Status: <None>   Medication Order Taking? Sig Documenting Provider Last Dose Status Informant  Accu-Chek Softclix Lancets lancets [id number] Yes Use to check blood sugar 3 times daily [name], MD  Active   AIMOVIG  140 MG/ML EMMANUEL [id number] Yes SMARTSIG:140 Milligram(s) SUB-Q Every 4 Weeks [provider]  Active   albuterol  (VENTOLIN  HFA) 108 (90 Base) MCG/ACT inhaler [id number] Yes Inhale 2 puffs into the lungs every 6 (six) hours as needed for wheezing or shortness of breath (Cough). [name], MD  Active   amLODipine  (NORVASC ) 10 MG tablet [id number] Yes Take 1 tablet (10 mg total) by mouth daily. [name], MD  Active   atorvastatin  (LIPITOR) 40 MG tablet [id number] Yes Take 1 tablet (40 mg total) by mouth daily. Dartha Ernst, MD  Active   Blood Glucose Monitoring Suppl (ACCU-CHEK GUIDE) w/Device KIT [id number] Yes Use to check blood sugar 3 times daily. [name], MD  Active   Blood Pressure Monitoring (BLOOD PRESSURE CUFF) MISC [id number] Yes 1 each by Does not apply route daily. Please provide Large cuff Wyn Jackee VEAR Mickey., NP  Active   busPIRone  (BUSPAR ) 30 MG tablet [id number] Yes Take 1 tablet (30 mg total) by mouth 2 (two) times daily. Nwoko, Uchenna E, PA  Active   Cholecalciferol  (VITAMIN D3) 10 MCG (400 UNIT) CAPS [id number] Yes Take 400 Units by mouth daily. [provider]  Active Self  ciclopirox  (PENLAC ) 8 % solution [id number] Yes Apply topically at bedtime. Apply over nail and surrounding skin. Apply daily over previous coat. After seven (7) days, may remove with alcohol and continue cycle. Lamount Ethan CROME, DPM  Active   Continuous Glucose Sensor (DEXCOM G7 SENSOR) MISC [id number] Yes Change sensor  every 10 days. Motwani, Komal, MD  Active Self  cyclobenzaprine  (FLEXERIL ) 10 MG tablet [id number]  Take 1 tablet (10  mg total) by mouth 3 (three) times daily as needed for muscle spasms.  Patient not taking: Reported on 08/17/2024   Currieo, Andrew D, MD  Active Self  cycloSPORINE  (RESTASIS ) 0.05 % ophthalmic emulsion [id number] Yes Apply 1 drop into both eyes twice a day   Active Self  diclofenac  (VOLTAREN ) 50 MG EC tablet [id number]  Take 1 tablet (50 mg total) by mouth 2 (two) times daily.  Patient not taking: Reported on 08/17/2024   Reddick, Johnathan B, NP  Active   DULoxetine  (CYMBALTA ) 60 MG capsule [id number] Yes Take 1 capsule (60 mg total) by mouth 2 (two) times daily. Nwoko, Uchenna E, PA  Active   fluticasone  (FLONASE ) 50 MCG/ACT nasal spray [id number] Yes Place 1 spray into both nostrils daily as needed for allergies or rhinitis. [name], MD  Active   furosemide  (LASIX ) 20 MG tablet [id number] Yes Take 1 tablet (20 mg total) by mouth daily as needed (weight gain). Wyn Jackee VEAR Mickey., NP  Active Self  gabapentin  (NEURONTIN ) 300 MG capsule [id number] Yes Take 1 capsule (300 mg total) by mouth 3 (three) times daily. Nwoko, Uchenna E, PA  Active   Glucagon  (BAQSIMI  ONE PACK) 3 MG/DOSE POWD [id number] Yes Place 1 Device into the nose as needed (Low blood sugar with impaired consciousness). Dartha Ernst, MD  Active Self           Med Note (ROBB, MELANIE A   Tue May 04, 2024  1:14 PM) Has on hand  glucose blood (ACCU-CHEK GUIDE) test strip [id number] Yes Use to check blood sugar 3 times daily [name], MD  Active   hydrOXYzine  (ATARAX ) 50 MG tablet [id number] Yes Take 1 tablet (50 mg total) by mouth 3 (three) times daily.  Patient taking differently: Take 50 mg by mouth every 8 (eight) hours as needed.   Nwoko, Uchenna E, PA  Active   Insulin  Pen Needle (PEN NEEDLES) 31G X 5 MM MISC [id number] Yes Use to inject insulin  three times daily. [name], MD  Active   insulin  regular human CONCENTRATED (HUMULIN  R U-500 KWIKPEN) 500 UNIT/ML KwikPen [id number] Yes Inject 85-95 Units into the  skin daily with breakfast AND 75-80 Units daily before lunch AND 65-70 Units daily before supper. Take 30 minutes before meals. Motwani, Komal, MD  Active   loratadine  (CLARITIN ) 10 MG tablet [id number]  Take 1 tablet (10 mg total) by mouth daily as needed for allergies.  Patient not taking: Reported on 08/17/2024   [name], MD  Active   methocarbamol  (ROBAXIN ) 500 MG tablet [id number]  Take 1 tablet (500 mg total) by mouth 2 (two) times daily.  Patient not taking: Reported on 08/12/2024   Reddick, Johnathan B, NP  Active   metoCLOPramide  (REGLAN ) 10 MG tablet [id number]  Take 1 tablet (10 mg total) by mouth 3 (three) times daily before meals.  Patient not taking: Reported on 08/17/2024   Newlin, Enobong, MD  Active Self  metoCLOPramide  (REGLAN ) 5 MG tablet [id number] Yes Take 1 tablet (5 mg total) by mouth every 8 (eight) hours as needed for nausea. Dose decrease [name], MD  Active   metoprolol  tartrate (LOPRESSOR ) 25 MG tablet [id number] Yes Take 1 tablet (25 mg total) by mouth 2 (two) times daily. Wyn Jackee VEAR Mickey., NP  Active Self  mometasone -formoterol  [facility]) 100-5 MCG/ACT TERESE [id number]  Inhale 2 puffs into the lungs in the morning and at bedtime.  Patient not taking: Reported on 08/17/2024   Meade Verdon RAMAN, MD  Active   Multiple Vitamins-Minerals (EYE VITAMINS PO) [id number] Yes Take 1 capsule by mouth daily. [provider]  Active Self  OLANZapine -Samidorphan (LYBALVI ) 15-10 MG TABS [id number] Yes Take 15 mg by mouth at bedtime. Nwoko, Uchenna E, PA  Active   omeprazole  (PRILOSEC) 40 MG capsule [id number] Yes TAKE 1 CAPSULE BY MOUTH ONCE DAILY 30 MINUTES  BEFORE BREAKFAST Kennedy-Smith, Colleen M, NP  Active   ondansetron  (ZOFRAN -ODT) 4 MG disintegrating tablet [id number] Yes Take 1 tablet (4 mg total) by mouth every 8 (eight) hours as needed for nausea or vomiting. [name], MD  Active Self  QUEtiapine  (SEROQUEL ) 25 MG tablet [id number] Yes Take 1 tablet  (25 mg total) by mouth at bedtime as needed. Nwoko, Uchenna E, PA  Active   SUMAtriptan  (IMITREX ) 100 MG tablet [id number]  TAKE 1 TABLET EARLIEST ONSET OF MIGRAINE. MAY REPEAT IN 2 HOURS IF HEADACHE PERSISTS OR RECURS. MAXIMUM 2 TABLETS IN 24 HOURS.  Patient not taking: Reported on 08/17/2024   Skeet Juliene SAUNDERS, DO  Active Self  traZODone  (DESYREL ) 150 MG tablet [id number] Yes Take 1 tablet (150 mg total) by mouth at bedtime as needed for sleep. Nwoko, Uchenna E, PA  Active   vitamin B-12 (CYANOCOBALAMIN) 50 MCG tablet [id number] Yes Take 50 mcg by mouth daily. [provider]  Active Self            Recommendation:   PCP Follow-up Specialty provider follow-up : Endocrinology- 08/24/24; [facility]- 08/26/24; Podiatry- 09/10/24; Neurology- 09/14/24, Cardiology; 09/16/24 and Pulmonary- 11/04/24. Continue Current Plan of Care  Follow Up Plan:   Telephone follow-up 2 weeks: 09/03/24 @ 9 am  Hilary Milks, RN, Scientist, research (physical sciences), Theatre manager [employer] [phone]

## 2024-08-20 ENCOUNTER — Other Ambulatory Visit: Payer: Self-pay

## 2024-08-24 ENCOUNTER — Other Ambulatory Visit

## 2024-08-24 ENCOUNTER — Ambulatory Visit: Admitting: "Endocrinology

## 2024-08-24 ENCOUNTER — Encounter: Payer: Self-pay | Admitting: "Endocrinology

## 2024-08-24 VITALS — BP 120/80 | HR 104 | Ht 63.5 in | Wt 255.0 lb

## 2024-08-24 DIAGNOSIS — E1065 Type 1 diabetes mellitus with hyperglycemia: Secondary | ICD-10-CM | POA: Diagnosis not present

## 2024-08-24 DIAGNOSIS — E7800 Pure hypercholesterolemia, unspecified: Secondary | ICD-10-CM

## 2024-08-24 LAB — POCT GLYCOSYLATED HEMOGLOBIN (HGB A1C): Hemoglobin A1C: 8 % — AB (ref 4.0–5.6)

## 2024-08-24 NOTE — Patient Instructions (Addendum)
 Will recommend the following: U500 125-130 units before break fast and 80-85 units before supper, 30 min before meals

## 2024-08-24 NOTE — Progress Notes (Signed)
 Outpatient Endocrinology Note Whitney Birmingham, MD  08/24/24   Whitney Benton [birth year] [id number]  Referring Provider: [name], MD Primary Care Provider: [name], MD Reason for consultation: Subjective   Assessment & Plan  Diagnoses and all orders for this visit:  Uncontrolled type 1 diabetes mellitus with hyperglycemia (HCC) -     POCT glycosylated hemoglobin (Hb A1C)  Pure hypercholesterolemia   Diabetes Type I complicated by hyperglycemia, c-peptide -ve, GAD Ab + Lab Results  Component Value Date   GFR 78.96 10/09/2022   Hba1c goal less than 7, current Hba1c is  Lab Results  Component Value Date   HGBA1C 8.0 (A) 08/24/2024   Will recommend the following: U500 130 units before break fast and 80-85 units before supper, 30 min before meals (skip lunch dose) Decrease insulin  dose by 5 units if BG is <70  Has DexCom G7  Patient experiencing  personal life issues-extensively counseled   Stopped Metformin  XR 500 mg bid - max tolerated dose. Patient did not experience any benefit from, only GI S/E  Changed to DexCom G7 from [place] 3 per pt request as herlene keeps falling off, pt unable to afford skin tac Stopped Toujeo  78 units qam and Humalog  26 units tidac 15 min before meals Was taken off of Trulicity  due to stomach issues  No known contraindications/side effects to any of above medications Glucagon  discussed and prescribed with refills on 11/24/23  -Last LD and Tg are as follows: Lab Results  Component Value Date   LDLCALC 105 (H) 03/31/2024    Lab Results  Component Value Date   TRIG 142 03/31/2024   -On atorvastatin  40 mg every day, LDL was 105 -04/06/24: started rosuvastatin  40 mg every day -Follow low fat diet and exercise   -Blood pressure goal <140/90 - Microalbumin/creatinine goal is < 30 -Last MA/Cr is as follows: Lab Results  Component Value Date   MICROALBUR 0.6 11/17/2023   -not on ACE/ARB  -diet changes including  salt restriction -limit eating outside -counseled BP targets per standards of diabetes care -uncontrolled blood pressure can lead to retinopathy, nephropathy and cardiovascular and atherosclerotic heart disease  Reviewed and counseled on: -A1C target -Blood sugar targets -Complications of uncontrolled diabetes  -Checking blood sugar before meals and bedtime and bring log next visit -All medications with mechanism of action and side effects -Hypoglycemia management: rule of 15's, Glucagon  Emergency Kit and medical alert ID -low-carb low-fat plate-method diet -At least 20 minutes of physical activity per day -Annual dilated retinal eye exam and foot exam -compliance and follow up needs -follow up as scheduled or earlier if problem gets worse  Call if blood sugar is less than 70 or consistently above 250    Take a 15 gm snack of carbohydrate at bedtime before you go to sleep if your blood sugar is less than 100.    If you are going to fast after midnight for a test or procedure, ask your physician for instructions on how to reduce/decrease your insulin  dose.    Call if blood sugar is less than 70 or consistently above 250  -Treating a low sugar by rule of 15  (15 gms of sugar every 15 min until sugar is more than 70) If you feel your sugar is low, test your sugar to be sure If your sugar is low (less than 70), then take 15 grams of a fast acting Carbohydrate (3-4 glucose tablets or glucose gel or 4 ounces of juice  or regular soda) Recheck your sugar 15 min after treating low to make sure it is more than 70 If sugar is still less than 70, treat again with 15 grams of carbohydrate          Don't drive the hour of hypoglycemia  If unconscious/unable to eat or drink by mouth, use glucagon  injection or nasal spray baqsimi  and call 911. Can repeat again in 15 min if still unconscious.  Return in about 29 days (around 09/22/2024).   I have reviewed current medications, nurse's notes,  allergies, vital signs, past medical and surgical history, family medical history, and social history for this encounter. Counseled patient on symptoms, examination findings, lab findings, imaging results, treatment decisions and monitoring and prognosis. The patient understood the recommendations and agrees with the treatment plan. All questions regarding treatment plan were fully answered.  Whitney Birmingham, MD  08/24/24    History of Present Illness Whitney Benton is a 53 y.o. year old female who presents for follow up of Type I diabetes mellitus.  Whitney Benton was first diagnosed in 2020.   Diabetes education +  Home diabetes regimen: U500 140 units before break fast and 70 units before supper, 30 min before meals  Previously on, Toujeo  72 units qam Humalog  24 units tidac 15 min before meals Metformin  XR 500 mg bid - gets diarrhea   COMPLICATIONS -  MI/Stroke -  retinopathy -  neuropathy -  nephropathy  BLOOD SUGAR DATA CGM interpretation: At today's visit, we reviewed her CGM downloads. The full report is scanned in the media. Reviewing the CGM trends, BG are elevated at night/overnight and high to low in morning and evening.   Physical Exam  BP 120/80   Pulse (!) 104   Ht 5' 3.5 (1.613 m)   Wt 255 lb (115.7 kg)   SpO2 98%   BMI 44.46 kg/m    Constitutional: well developed, well nourished Head: normocephalic, atraumatic Eyes: sclera anicteric, no redness Neck: supple Lungs: normal respiratory effort Neurology: alert and oriented Skin: dry, no appreciable rashes Musculoskeletal: no appreciable defects Psychiatric: normal mood and affect Diabetic Foot Exam - Simple   No data filed      Current Medications Patient's Medications  New Prescriptions   No medications on file  Previous Medications   ACCU-CHEK SOFTCLIX LANCETS LANCETS    Use to check blood sugar 3 times daily   ACETAMINOPHEN  (TYLENOL ) 650 MG CR TABLET    Take 650 mg by mouth every 8 (eight) hours  as needed for pain.   ACETAMINOPHEN  (TYLENOL ) 650 MG CR TABLET    Take 650 mg by mouth every 8 (eight) hours as needed for pain.   AIMOVIG  140 MG/ML SOAJ    SMARTSIG:140 Milligram(s) SUB-Q Every 4 Weeks   ALBUTEROL  (VENTOLIN  HFA) 108 (90 BASE) MCG/ACT INHALER    Inhale 2 puffs into the lungs every 6 (six) hours as needed for wheezing or shortness of breath (Cough).   AMLODIPINE  (NORVASC ) 10 MG TABLET    Take 1 tablet (10 mg total) by mouth daily.   ATORVASTATIN  (LIPITOR) 40 MG TABLET    Take 1 tablet (40 mg total) by mouth daily.   BLOOD GLUCOSE MONITORING SUPPL (ACCU-CHEK GUIDE) W/DEVICE KIT    Use to check blood sugar 3 times daily.   BLOOD PRESSURE MONITORING (BLOOD PRESSURE CUFF) MISC    1 each by Does not apply route daily. Please provide Large cuff   BUSPIRONE  (BUSPAR ) 30 MG TABLET  Take 1 tablet (30 mg total) by mouth 2 (two) times daily.   CHOLECALCIFEROL  (VITAMIN D3) 10 MCG (400 UNIT) CAPS    Take 400 Units by mouth daily.   CICLOPIROX  (PENLAC ) 8 % SOLUTION    Apply topically at bedtime. Apply over nail and surrounding skin. Apply daily over previous coat. After seven (7) days, may remove with alcohol and continue cycle.   CONTINUOUS GLUCOSE SENSOR (DEXCOM G7 SENSOR) MISC    Change sensor every 10 days.   CYCLOBENZAPRINE  (FLEXERIL ) 10 MG TABLET    Take 1 tablet (10 mg total) by mouth 3 (three) times daily as needed for muscle spasms.   CYCLOSPORINE  (RESTASIS ) 0.05 % OPHTHALMIC EMULSION    Apply 1 drop into both eyes twice a day   DICLOFENAC  (VOLTAREN ) 50 MG EC TABLET    Take 1 tablet (50 mg total) by mouth 2 (two) times daily.   DULOXETINE  (CYMBALTA ) 60 MG CAPSULE    Take 1 capsule (60 mg total) by mouth 2 (two) times daily.   FLUTICASONE  (FLONASE ) 50 MCG/ACT NASAL SPRAY    Place 1 spray into both nostrils daily as needed for allergies or rhinitis.   FUROSEMIDE  (LASIX ) 20 MG TABLET    Take 1 tablet (20 mg total) by mouth daily as needed (weight gain).   GABAPENTIN  (NEURONTIN ) 300 MG  CAPSULE    Take 1 capsule (300 mg total) by mouth 3 (three) times daily.   GLUCAGON  (BAQSIMI  ONE PACK) 3 MG/DOSE POWD    Place 1 Device into the nose as needed (Low blood sugar with impaired consciousness).   GLUCOSE BLOOD (ACCU-CHEK GUIDE) TEST STRIP    Use to check blood sugar 3 times daily   HYDROXYZINE  (ATARAX ) 50 MG TABLET    Take 1 tablet (50 mg total) by mouth 3 (three) times daily.   INSULIN  PEN NEEDLE (PEN NEEDLES) 31G X 5 MM MISC    Use to inject insulin  three times daily.   INSULIN  REGULAR HUMAN CONCENTRATED (HUMULIN  R U-500 KWIKPEN) 500 UNIT/ML KWIKPEN    Inject 85-95 Units into the skin daily with breakfast AND 75-80 Units daily before lunch AND 65-70 Units daily before supper. Take 30 minutes before meals.   LORATADINE  (CLARITIN ) 10 MG TABLET    Take 1 tablet (10 mg total) by mouth daily as needed for allergies.   METHOCARBAMOL  (ROBAXIN ) 500 MG TABLET    Take 1 tablet (500 mg total) by mouth 2 (two) times daily.   METOCLOPRAMIDE  (REGLAN ) 10 MG TABLET    Take 1 tablet (10 mg total) by mouth 3 (three) times daily before meals.   METOCLOPRAMIDE  (REGLAN ) 5 MG TABLET    Take 1 tablet (5 mg total) by mouth every 8 (eight) hours as needed for nausea. Dose decrease   METOPROLOL  TARTRATE (LOPRESSOR ) 25 MG TABLET    Take 1 tablet (25 mg total) by mouth 2 (two) times daily.   MOMETASONE -FORMOTEROL  (DULERA) 100-5 MCG/ACT AERO    Inhale 2 puffs into the lungs in the morning and at bedtime.   MULTIPLE VITAMINS-MINERALS (EYE VITAMINS PO)    Take 1 capsule by mouth daily.   OLANZAPINE -SAMIDORPHAN (LYBALVI ) 15-10 MG TABS    Take 15 mg by mouth at bedtime.   OMEPRAZOLE  (PRILOSEC) 40 MG CAPSULE    TAKE 1 CAPSULE BY MOUTH ONCE DAILY 30 MINUTES  BEFORE BREAKFAST   ONDANSETRON  (ZOFRAN -ODT) 4 MG DISINTEGRATING TABLET    Take 1 tablet (4 mg total) by mouth every 8 (eight) hours as needed for  nausea or vomiting.   QUETIAPINE  (SEROQUEL ) 25 MG TABLET    Take 1 tablet (25 mg total) by mouth at bedtime as needed.    SUMATRIPTAN  (IMITREX ) 100 MG TABLET    TAKE 1 TABLET EARLIEST ONSET OF MIGRAINE. MAY REPEAT IN 2 HOURS IF HEADACHE PERSISTS OR RECURS. MAXIMUM 2 TABLETS IN 24 HOURS.   TRAZODONE  (DESYREL ) 150 MG TABLET    Take 1 tablet (150 mg total) by mouth at bedtime as needed for sleep.   VITAMIN B-12 (CYANOCOBALAMIN) 50 MCG TABLET    Take 50 mcg by mouth daily.  Modified Medications   No medications on file  Discontinued Medications   No medications on file    Allergies Allergies  Allergen Reactions   Aspirin Hives   Trulicity  [Dulaglutide ] Other (See Comments)    DC'd due to chronic gastric and abdominal pain with nausea   Oxycodone  Nausea And Vomiting    Past Medical History Past Medical History:  Diagnosis Date   Allergy    Anxiety    Arthritis    Asthma    Depression    Diabetes mellitus without complication (HCC)    GERD (gastroesophageal reflux disease)    Glaucoma suspect    Hyperlipidemia    Trichomonas infection     Past Surgical History Past Surgical History:  Procedure Laterality Date   CESAREAN SECTION     UPPER GASTROINTESTINAL ENDOSCOPY     VAGINAL HYSTERECTOMY  12/23/2004   Fibroids, menorrhagia, benign pathology    Family History family history includes Colon cancer in her paternal grandmother; Colon cancer (age of onset: 38) in her father; Depression in her mother; Diabetes in her father and mother; Hypertension in her father and mother; Mental illness in her mother.  Social History Social History   Socioeconomic History   Marital status: Significant Other    Spouse name: Not on file   Number of children: 3   Years of education: 14   Highest education level: Not on file  Occupational History   Not on file  Tobacco Use   Smoking status: Former    Current packs/day: 0.00    Average packs/day: 0.5 packs/day for 13.0 years (6.5 ttl pk-yrs)    Types: Cigarettes    Start date: 2008    Quit date: 2021    Years since quitting: 4.6    Passive exposure:  Past   Smokeless tobacco: Never  Vaping Use   Vaping status: Never Used  Substance and Sexual Activity   Alcohol use: Not Currently    Comment: occasional   Drug use: Yes    Types: Marijuana    Comment: occ   Sexual activity: Yes    Birth control/protection: Surgical  Other Topics Concern   Not on file  Social History Narrative   Patient lives at home with mother and father , one story    Patient has 3 children    Patient is single   Patient has 14 years of education    Patient is right handed    Caffeine none   Social Drivers of Corporate investment banker Strain: High Risk (05/26/2024)   Overall Financial Resource Strain (CARDIA)    Difficulty of Paying Living Expenses: Hard  Food Insecurity: Food Insecurity Present (08/17/2024)   Hunger Vital Sign    Worried About Running Out of Food in the Last Year: Sometimes true    Ran Out of Food in the Last Year: Sometimes true  Transportation Needs: No Transportation Needs (08/17/2024)  PRAPARE - Administrator, Civil Service (Medical): No    Lack of Transportation (Non-Medical): No  Physical Activity: Inactive (05/11/2024)   Exercise Vital Sign    Days of Exercise per Week: 0 days    Minutes of Exercise per Session: 0 min  Stress: Stress Concern Present (05/11/2024)   [employer] of Occupational Health - Occupational Stress Questionnaire    Feeling of Stress : Very much  Social Connections: Unknown (05/11/2024)   Social Connection and Isolation Panel    Frequency of Communication with Friends and Family: More than three times a week    Frequency of Social Gatherings with Friends and Family: More than three times a week    Attends Religious Services: More than 4 times per year    Active Member of Clubs or Organizations: Yes    Attends Banker Meetings: 1 to 4 times per year    Marital Status: Patient declined  Intimate Partner Violence: Not At Risk (08/17/2024)   Humiliation, Afraid, Rape, and Kick  questionnaire    Fear of Current or Ex-Partner: No    Emotionally Abused: No    Physically Abused: No    Sexually Abused: No    Lab Results  Component Value Date   HGBA1C 8.0 (A) 08/24/2024   HGBA1C 10.0 (H) 05/01/2024   HGBA1C 9.8 (H) 02/18/2024   Lab Results  Component Value Date   CHOL 169 03/31/2024   Lab Results  Component Value Date   HDL 40 (L) 03/31/2024   Lab Results  Component Value Date   LDLCALC 105 (H) 03/31/2024   Lab Results  Component Value Date   TRIG 142 03/31/2024   Lab Results  Component Value Date   CHOLHDL 4.2 03/31/2024   Lab Results  Component Value Date   CREATININE 0.98 05/24/2024   Lab Results  Component Value Date   GFR 78.96 10/09/2022   Lab Results  Component Value Date   MICROALBUR 0.6 11/17/2023      Component Value Date/Time   NA 138 05/24/2024 1057   NA 138 03/23/2024 0934   K 3.6 05/24/2024 1057   CL 102 05/24/2024 1057   CO2 23 05/24/2024 1057   GLUCOSE 328 (H) 05/24/2024 1057   BUN 13 05/24/2024 1057   BUN 10 03/23/2024 0934   CREATININE 0.98 05/24/2024 1057   CREATININE 0.90 11/17/2023 1047   CALCIUM  9.5 05/24/2024 1057   PROT 7.9 05/24/2024 1057   PROT 6.9 03/23/2024 0934   ALBUMIN 4.2 05/24/2024 1057   ALBUMIN 4.3 03/23/2024 0934   AST 26 05/24/2024 1057   ALT 24 05/24/2024 1057   ALKPHOS 108 05/24/2024 1057   BILITOT 0.9 05/24/2024 1057   BILITOT 0.3 03/23/2024 0934   GFRNONAA >60 05/24/2024 1057   GFRAA >60 08/10/2020 2100      Latest Ref Rng & Units 05/24/2024   10:57 AM 05/01/2024    8:56 AM 04/30/2024   10:17 AM  BMP  Glucose 70 - 99 mg/dL 671  723  621   BUN 6 - 20 mg/dL 13  8  9    Creatinine 0.44 - 1.00 mg/dL 9.01  9.18  9.04   Sodium 135 - 145 mmol/L 138  139  137   Potassium 3.5 - 5.1 mmol/L 3.6  3.7  3.7   Chloride 98 - 111 mmol/L 102  107  98   CO2 22 - 32 mmol/L 23  24  26    Calcium  8.9 - 10.3 mg/dL 9.5  8.5  9.1        Component Value Date/Time   WBC 10.1 05/24/2024 1057   RBC 5.03  05/24/2024 1057   HGB 13.0 05/24/2024 1057   HGB 13.3 12/12/2022 1036   HCT 42.0 05/24/2024 1057   HCT 40.4 12/12/2022 1036   PLT 430 (H) 05/24/2024 1057   PLT 436 12/12/2022 1036   MCV 83.5 05/24/2024 1057   MCV 86 12/12/2022 1036   MCH 25.8 (L) 05/24/2024 1057   MCHC 31.0 05/24/2024 1057   RDW 15.9 (H) 05/24/2024 1057   RDW 12.9 12/12/2022 1036   LYMPHSABS 1.1 06/20/2023 1122   LYMPHSABS 2.4 07/14/2019 1058   MONOABS 0.6 06/20/2023 1122   EOSABS 0.0 06/20/2023 1122   EOSABS 0.1 07/14/2019 1058   BASOSABS 0.0 06/20/2023 1122   BASOSABS 0.0 07/14/2019 1058     Parts of this note may have been dictated using voice recognition software. There may be variances in spelling and vocabulary which are unintentional. Not all errors are proofread. Please notify the dino if any discrepancies are noted or if the meaning of any statement is not clear.

## 2024-08-25 ENCOUNTER — Encounter: Payer: Self-pay | Admitting: "Endocrinology

## 2024-08-25 LAB — MICROALBUMIN / CREATININE URINE RATIO
Creatinine, Urine: 98 mg/dL (ref 20–275)
Microalb Creat Ratio: 11 mg/g{creat} (ref <30)
Microalb, Ur: 1.1 mg/dL

## 2024-08-25 LAB — LIPID PANEL
Cholesterol: 165 mg/dL (ref <200)
HDL: 45 mg/dL — ABNORMAL LOW (ref >=50)
LDL Cholesterol (Calc): 95 mg/dL
Non-HDL Cholesterol (Calc): 120 mg/dL (ref <130)
Total CHOL/HDL Ratio: 3.7 (calc) (ref <5.0)
Triglycerides: 150 mg/dL — ABNORMAL HIGH (ref <150)

## 2024-08-26 ENCOUNTER — Ambulatory Visit (HOSPITAL_COMMUNITY): Admitting: Clinical

## 2024-08-26 DIAGNOSIS — F331 Major depressive disorder, recurrent, moderate: Secondary | ICD-10-CM

## 2024-08-26 NOTE — Progress Notes (Signed)
 Whitney Benton is a 53 y.o. female patient presented oriented x 5, appropriately dressed, and friendly.  Client denied hallucinations and delusions.  Client reported on today she is doing pretty well.  Client reported there are no changes or issues to discuss today.  Client reported she is watching her 2 grandsons.  Client reported she is happy to report that her A1c has gone down from a 10 to an 8.  **Therapist and client did not meet for a billable amount of time.    Collaboration of Care: Other therapist has the client scheduled for another therapy appointment.  Patient/Guardian was advised Release of Information must be obtained prior to any record release in order to collaborate their care with an outside provider. Patient/Guardian was advised if they have not already done so to contact the registration department to sign all necessary forms in order for us  to release information regarding their care.   Consent: Patient/Guardian gives verbal consent for treatment and assignment of benefits for services provided during this visit. Patient/Guardian expressed understanding and agreed to proceed.    Ellijah Leffel Y Fabio Wah, LCSW

## 2024-08-29 ENCOUNTER — Ambulatory Visit (HOSPITAL_COMMUNITY)
Admission: RE | Admit: 2024-08-29 | Discharge: 2024-08-29 | Disposition: A | Discharge status: Home / Self Care | Payer: Self-pay | Source: Ambulatory Visit | Attending: Physician Assistant | Admitting: Physician Assistant

## 2024-08-29 ENCOUNTER — Encounter (HOSPITAL_COMMUNITY): Payer: Self-pay

## 2024-08-29 ENCOUNTER — Ambulatory Visit (INDEPENDENT_AMBULATORY_CARE_PROVIDER_SITE_OTHER)

## 2024-08-29 ENCOUNTER — Other Ambulatory Visit: Payer: Self-pay

## 2024-08-29 VITALS — BP 135/85 | HR 99 | Temp 98.2°F | Resp 18

## 2024-08-29 DIAGNOSIS — G8929 Other chronic pain: Secondary | ICD-10-CM

## 2024-08-29 DIAGNOSIS — M25551 Pain in right hip: Secondary | ICD-10-CM | POA: Diagnosis not present

## 2024-08-29 DIAGNOSIS — M542 Cervicalgia: Secondary | ICD-10-CM | POA: Diagnosis not present

## 2024-08-29 MED ORDER — BACLOFEN 10 MG PO TABS: 10.0000 mg | ORAL_TABLET | Freq: Two times a day (BID) | ORAL | 0 refills | Status: DC | PRN

## 2024-08-29 MED ORDER — LIDOCAINE 5 % EX OINT: 1.0000 | TOPICAL_OINTMENT | Freq: Three times a day (TID) | CUTANEOUS | 0 refills | Status: DC | PRN

## 2024-08-29 NOTE — Discharge Instructions (Signed)
 The x-ray of your hip was normal.  I have called in baclofen  to help with your pain.  This replaces other muscle relaxers (methocarbamol /Robaxin , cyclobenzaprine /Flexeril ) but I am hopeful that it will provide you more relief.  It will make you sleepy so do not drive or drink alcohol with taking it.  Continue taking Tylenol .  I also recommend heat and gentle stretch.  Apply lidocaine  ointment to areas that are specifically bothersome.  Follow-up with sports medicine first thing tomorrow if your symptoms persist.  If anything worsens and you have increasing pain, fever, nausea/vomiting, numbness or tingling in your hands/feet that is different than normal you need to be seen immediately.

## 2024-08-29 NOTE — ED Provider Notes (Signed)
 [facility]    CSN: [id number] Arrival date & time: 08/29/24  1305      History   Chief Complaint Chief Complaint  Patient presents with   Shoulder Pain    Entered by patient   Leg Pain    HPI Whitney Benton is a 53 y.o. female.   Patient presents today with several concerns.  She reports a prolonged history of neck and right shoulder pain.  She was seen by clinic several times in July at which point she was prescribed both methocarbamol  and diclofenac  which provided minimal improvement of symptoms.  She has since established with sports medicine and had plain films of her shoulder and cervical spine on 07/21/2024 that showed degenerative changes without acute osseous abnormality.  Patient reports that over the past week this has gradually been worsening and she is now having difficulty sleeping because anytime she moves in bed she has worsening pain.  The pain is rated 10 on a 0-10 pain scale, described as aching, no aggravating or relieving factors identified.  She does report numbness and tingling in her hand but reports that this is at baseline due to history of carpal tunnel.  She denies any recent trauma or change in activity.  She has been taking Tylenol  without improvement of symptoms.  In addition, when she woke up this morning she had significant pain in her right hip that caused her to have difficulty ambulating.  She denies any previous injury or surgery involving her hip.  She reports that the pain is minimal at rest but increases significantly with attempted ambulation, localized to the lateral and anterior portion of her hip, described as sharp, no alleviating factors notified.  She denies any numbness or paresthesias in her foot.    Past Medical History:  Diagnosis Date   Allergy    Anxiety    Arthritis    Asthma    Depression    Diabetes mellitus without complication (HCC)    GERD (gastroesophageal reflux disease)    Glaucoma suspect    Hyperlipidemia     Trichomonas infection     Patient Active Problem List   Diagnosis Date Noted   Right carpal tunnel syndrome 07/30/2024   Left carpal tunnel syndrome 07/30/2024   Uncontrolled type 2 diabetes mellitus with hyperglycemia, with long-term current use of insulin  (HCC) 04/30/2024   Intractable nausea and vomiting 12/23/2023   Diabetic gastroparesis (HCC) 12/22/2023   DKA, type 1 (HCC) 06/20/2023   Essential hypertension 06/20/2023   Vitamin D  deficiency 06/20/2023   Hyperlipidemia 03/06/2023   Primary insomnia 12/27/2022   Gallstone pancreatitis 12/03/2022   Chronic cholecystitis with calculus 12/03/2022   Morbid obesity (HCC) 04/04/2022   Former smoker 04/30/2021   Major depressive disorder, recurrent episode, moderate (HCC) 04/30/2021   Substance induced mood disorder (HCC) 03/22/2021   Generalized anxiety disorder 03/22/2021   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   DKA (diabetic ketoacidosis) (HCC) 01/23/2021   Glaucoma suspect 04/12/2020   DKA, type 2, not at goal [facility]) 10/02/2019   AKI (acute kidney injury) (HCC) 10/02/2019   Hyperkalemia 10/02/2019   Leukocytosis 10/02/2019   Abnormal LFTs 10/02/2019   Stressful life events affecting family and household 08/06/2019   New onset type 2 diabetes mellitus (HCC) 02/04/2019   Morbid obesity with BMI of 40.0-44.9, adult (HCC) 02/04/2019   Tobacco dependence 02/04/2019   Left arm weakness 12/06/2014   Paresthesias/numbness 12/06/2014   Tobacco abuse 11/14/2014   Depression    Mixed  incontinence 06/27/2014   History of TVH in 2006 for fibroids and menorrhagia; benign pathology 09/08/2011    Past Surgical History:  Procedure Laterality Date   CESAREAN SECTION     UPPER GASTROINTESTINAL ENDOSCOPY     VAGINAL HYSTERECTOMY  12/23/2004   Fibroids, menorrhagia, benign pathology    OB History     Gravida  5   Para  3   Term  3   Preterm      AB  2   Living  3      SAB  2   IAB  0   Ectopic  0    Multiple  0   Live Births               Home Medications    Prior to Admission medications   Medication Sig Start Date End Date Taking? Authorizing Provider  baclofen  (LIORESAL ) 10 MG tablet Take 1 tablet (10 mg total) by mouth 2 (two) times daily as needed for muscle spasms. 08/29/24  Yes Alexandrea Westergard K, PA-C  lidocaine  (XYLOCAINE ) 5 % ointment Apply 1 Application topically 3 (three) times daily as needed. 08/29/24  Yes Trooper Olander K, PA-C  Accu-Chek Softclix Lancets lancets Use to check blood sugar 3 times daily 08/11/23   [name], MD  acetaminophen  (TYLENOL ) 650 MG CR tablet Take 650 mg by mouth every 8 (eight) hours as needed for pain.    [provider]  acetaminophen  (TYLENOL ) 650 MG CR tablet Take 650 mg by mouth every 8 (eight) hours as needed for pain.    [provider]  AIMOVIG  140 MG/ML SOAJ SMARTSIG:140 Milligram(s) SUB-Q Every 4 Weeks 06/10/24   [provider]  albuterol  (VENTOLIN  HFA) 108 (90 Base) MCG/ACT inhaler Inhale 2 puffs into the lungs every 6 (six) hours as needed for wheezing or shortness of breath (Cough). 05/11/24   [name], MD  amLODipine  (NORVASC ) 10 MG tablet Take 1 tablet (10 mg total) by mouth daily. 05/20/24   [name], MD  atorvastatin  (LIPITOR) 40 MG tablet Take 1 tablet (40 mg total) by mouth daily. 05/11/24   Motwani, Komal, MD  Blood Glucose Monitoring Suppl (ACCU-CHEK GUIDE) w/Device KIT Use to check blood sugar 3 times daily. 08/11/23   [name], MD  Blood Pressure Monitoring (BLOOD PRESSURE CUFF) MISC 1 each by Does not apply route daily. Please provide Large cuff 03/23/24   Wyn Jackee VEAR Mickey., NP  busPIRone  (BUSPAR ) 30 MG tablet Take 1 tablet (30 mg total) by mouth 2 (two) times daily. 07/28/24   Nwoko, Uchenna E, PA  Cholecalciferol  (VITAMIN D3) 10 MCG (400 UNIT) CAPS Take 400 Units by mouth daily.    [provider]  ciclopirox  (PENLAC ) 8 % solution Apply topically at bedtime.  Apply over nail and surrounding skin. Apply daily over previous coat. After seven (7) days, may remove with alcohol and continue cycle. 07/19/24   Lamount Ethan CROME, DPM  Continuous Glucose Sensor (DEXCOM G7 SENSOR) MISC Change sensor every 10 days. 03/03/24   Motwani, Komal, MD  cycloSPORINE  (RESTASIS ) 0.05 % ophthalmic emulsion Apply 1 drop into both eyes twice a day 09/19/21     DULoxetine  (CYMBALTA ) 60 MG capsule Take 1 capsule (60 mg total) by mouth 2 (two) times daily. 07/28/24   Nwoko, Uchenna E, PA  fluticasone  (FLONASE ) 50 MCG/ACT nasal spray Place 1 spray into both nostrils daily as needed for allergies or rhinitis. 05/11/24   [name], MD  furosemide  (LASIX ) 20 MG tablet Take 1 tablet (20 mg total) by mouth daily as needed (weight gain). 03/23/24   Wyn Jackee VEAR Mickey., NP  gabapentin  (NEURONTIN ) 300 MG capsule Take 1 capsule (300 mg total) by mouth 3 (three) times daily. 07/28/24   Nwoko, Uchenna E, PA  Glucagon  (BAQSIMI  ONE PACK) 3 MG/DOSE POWD Place 1 Device into the nose as needed (Low blood sugar with impaired consciousness). 11/24/23   Motwani, Komal, MD  glucose blood (ACCU-CHEK GUIDE) test strip Use to check blood sugar 3 times daily 08/11/23   [name], MD  hydrOXYzine  (ATARAX ) 50 MG tablet Take 1 tablet (50 mg total) by mouth 3 (three) times daily. Patient taking differently: Take 50 mg by mouth every 8 (eight) hours as needed. 07/28/24   Nwoko, Uchenna E, PA  Insulin  Pen Needle (PEN NEEDLES) 31G X 5 MM MISC Use to inject insulin  three times daily. 05/18/24   [name], MD  insulin  regular human CONCENTRATED (HUMULIN  R U-500 KWIKPEN) 500 UNIT/ML KwikPen Inject 85-95 Units into the skin daily with breakfast AND 75-80 Units daily before lunch AND 65-70 Units daily before supper. Take 30 minutes before meals. 07/15/24   Motwani, Komal, MD  loratadine  (CLARITIN ) 10 MG tablet Take 1 tablet (10 mg total) by mouth daily as needed for allergies. 05/11/24   [name], MD   metoCLOPramide  (REGLAN ) 10 MG tablet Take 1 tablet (10 mg total) by mouth 3 (three) times daily before meals. 11/14/23   Newlin, Enobong, MD  metoCLOPramide  (REGLAN ) 5 MG tablet Take 1 tablet (5 mg total) by mouth every 8 (eight) hours as needed for nausea. Dose decrease 05/11/24   [name], MD  metoprolol  tartrate (LOPRESSOR ) 25 MG tablet Take 1 tablet (25 mg total) by mouth 2 (two) times daily. 03/23/24   Wyn Jackee VEAR Mickey., NP  mometasone -formoterol  (DULERA) 100-5 MCG/ACT AERO Inhale 2 puffs into the lungs in the morning and at bedtime. 05/24/24   Desai, Nikita S, MD  Multiple Vitamins-Minerals (EYE VITAMINS PO) Take 1 capsule by mouth daily.    [provider]  OLANZapine -Samidorphan (LYBALVI ) 15-10 MG TABS Take 15 mg by mouth at bedtime. 07/28/24   Nwoko, Uchenna E, PA  omeprazole  (PRILOSEC) 40 MG capsule TAKE 1 CAPSULE BY MOUTH ONCE DAILY 30 MINUTES  BEFORE BREAKFAST 07/12/24   Cara Elida HERO, NP  ondansetron  (ZOFRAN -ODT) 4 MG disintegrating tablet Take 1 tablet (4 mg total) by mouth every 8 (eight) hours as needed for nausea or vomiting. 04/29/24   [name], MD  QUEtiapine  (SEROQUEL ) 25 MG tablet Take 1 tablet (25 mg total) by mouth at bedtime as needed. 07/28/24   Nwoko, Uchenna E, PA  SUMAtriptan  (IMITREX ) 100 MG tablet TAKE 1 TABLET EARLIEST ONSET OF MIGRAINE. MAY REPEAT IN 2 HOURS IF HEADACHE PERSISTS OR RECURS. MAXIMUM 2 TABLETS IN 24 HOURS. 03/11/24 03/11/25  Skeet Juliene SAUNDERS, DO  traZODone  (DESYREL ) 150 MG tablet Take 1 tablet (150 mg total) by mouth at bedtime as needed for sleep. 07/28/24   Nwoko, Uchenna E, PA  vitamin B-12 (CYANOCOBALAMIN) 50 MCG tablet Take 50 mcg by mouth daily.    [provider]    Family History Family History  Problem Relation Age of Onset   Diabetes Mother    Mental illness Mother    Depression Mother    Hypertension Mother    Colon cancer Father 18   Hypertension Father    Diabetes Father    Colon  cancer Paternal  Grandmother    Esophageal cancer Neg Hx    Stomach cancer Neg Hx    Rectal cancer Neg Hx    Asthma Neg Hx     Social History Social History   Tobacco Use   Smoking status: Former    Current packs/day: 0.00    Average packs/day: 0.5 packs/day for 13.0 years (6.5 ttl pk-yrs)    Types: Cigarettes    Start date: 2008    Quit date: 2021    Years since quitting: 4.6    Passive exposure: Past   Smokeless tobacco: Never  Vaping Use   Vaping status: Never Used  Substance Use Topics   Alcohol use: Not Currently    Comment: occasional   Drug use: Yes    Types: Marijuana    Comment: occ     Allergies   Aspirin, Trulicity  [dulaglutide ], and Oxycodone    Review of Systems Review of Systems  Constitutional:  Positive for activity change. Negative for appetite change, fatigue and fever.  Gastrointestinal:  Negative for abdominal pain, diarrhea, nausea and vomiting.  Musculoskeletal:  Positive for arthralgias and neck pain. Negative for myalgias.  Neurological:  Negative for weakness and numbness.     Physical Exam Triage Vital Signs ED Triage Vitals [08/29/24 1318]  Encounter Vitals Group     BP 135/85     Girls Systolic BP Percentile      Girls Diastolic BP Percentile      Boys Systolic BP Percentile      Boys Diastolic BP Percentile      Pulse Rate 99     Resp 18     Temp 98.2 F (36.8 C)     Temp Source Oral     SpO2 99 %     Weight      Height      Head Circumference      Peak Flow      Pain Score 10     Pain Loc      Pain Education      Exclude from Growth Chart    No data found.  Updated Vital Signs BP 135/85 (BP Location: Right Arm)   Pulse 99   Temp 98.2 F (36.8 C) (Oral)   Resp 18   SpO2 99%   Visual Acuity Right Eye Distance:   Left Eye Distance:   Bilateral Distance:    Right Eye Near:   Left Eye Near:    Bilateral Near:     Physical Exam Vitals reviewed.  Constitutional:      General: She is awake. She is not in acute distress.     Appearance: Normal appearance. She is well-developed. She is not ill-appearing.     Comments: Very pleasant female appears stated age in no acute distress sitting comfortably in exam room  HENT:     Head: Normocephalic and atraumatic.  Cardiovascular:     Rate and Rhythm: Normal rate and regular rhythm.     Heart sounds: Normal heart sounds, S1 normal and S2 normal. No murmur heard. Pulmonary:     Effort: Pulmonary effort is normal.     Breath sounds: Normal breath sounds. No wheezing, rhonchi or rales.     Comments: Clear to auscultation bilaterally Musculoskeletal:     Cervical back: Spasms and tenderness present. No bony tenderness.     Thoracic back: No tenderness or bony tenderness.     Lumbar back: No tenderness or bony tenderness.     Comments: Back/neck:  Mild tenderness palpation over left trapezius but significant tenderness over right trapezius.  No deformity noted.  Full active range of motion but patient reports discomfort with rotation to the right.  Strength 5/5 bilateral upper extremities.  Patient does have decreased range of motion of right shoulder including internal/external rotation secondary to neck pain.  Hand is neurovascularly intact.  Right hip: Tender to palpation over anterior and lateral right hip without deformity.  Strength 5/5.  Normal passive range of motion.  Psychiatric:        Behavior: Behavior is cooperative.      UC Treatments / Results  Labs (all labs ordered are listed, but only abnormal results are displayed) Labs Reviewed - No data to display  EKG   Radiology DG Hip Unilat With Pelvis 2-3 Views Right Result Date: 08/29/2024 CLINICAL DATA:  Several day history of right hip pain. No known injury. EXAM: DG HIP (WITH OR WITHOUT PELVIS) 3V RIGHT COMPARISON:  None Available. FINDINGS: There is no evidence of hip fracture or dislocation. There is no evidence of arthropathy or other focal bone abnormality. IMPRESSION: No focal radiographic  abnormality. Electronically Signed   By: Limin  Xu M.D.   On: 08/29/2024 13:57    Procedures Procedures (including critical care time)  Medications Ordered in UC Medications - No data to display  Initial Impression / Assessment and Plan / UC Course  I have reviewed the triage vital signs and the nursing notes.  Pertinent labs & imaging results that were available during my care of the patient were reviewed by me and considered in my medical decision making (see chart for details).     Patient is well-appearing, afebrile, nontoxic, nontachycardic.  She denies any alarm symptoms that would warrant emergent evaluation or imaging.  She has recently had x-rays of her neck and shoulder and reports that her pain is chronic and has not significantly changed and she denies any recent trauma so repeat imaging was deferred.  We did obtain an x-ray of her hip given this pain to her started today that showed no acute osseous abnormality.  Recommended baclofen  to help with discomfort.  No indication for dose adjustment based on metabolic panel from 05/24/2024 with a creatinine of 0.98 and calculated creatinine clearance of 121 mL/min.  We discussed that this replaces additional muscle relaxers that she has been prescribed and they should not be combined.  She is to continue Tylenol  as this has provided some relief of symptoms.  Given her history of hives with aspirin will defer additional NSAIDs for the time being particularly as the diclofenac  was not particularly helpful.  Given her history of uncontrolled diabetes she is not a candidate for systemic corticosteroids at this time.  She was given topical lidocaine  to apply to specific areas that are bothersome.  Also recommended heat and gentle stretch.  She is established with sports medicine was strongly encouraged to call them to schedule appointment if her symptoms are not improving with conservative treatment measures.  We discussed that if anything worsens or  changes she needs to be seen immediately.  Strict return precautions given.  All questions answered to patient satisfaction.  Final Clinical Impressions(s) / UC Diagnoses   Final diagnoses:  Right hip pain  Chronic neck pain     Discharge Instructions      The x-ray of your hip was normal.  I have called in baclofen  to help with your pain.  This replaces other muscle relaxers (methocarbamol /Robaxin , cyclobenzaprine /Flexeril ) but I am  hopeful that it will provide you more relief.  It will make you sleepy so do not drive or drink alcohol with taking it.  Continue taking Tylenol .  I also recommend heat and gentle stretch.  Apply lidocaine  ointment to areas that are specifically bothersome.  Follow-up with sports medicine first thing tomorrow if your symptoms persist.  If anything worsens and you have increasing pain, fever, nausea/vomiting, numbness or tingling in your hands/feet that is different than normal you need to be seen immediately.     ED Prescriptions     Medication Sig Dispense Auth. Provider   lidocaine  (XYLOCAINE ) 5 % ointment Apply 1 Application topically 3 (three) times daily as needed. 50 g Copper Basnett K, PA-C   baclofen  (LIORESAL ) 10 MG tablet Take 1 tablet (10 mg total) by mouth 2 (two) times daily as needed for muscle spasms. 14 each Kitiara Hintze K, PA-C      PDMP not reviewed this encounter.   Sherrell Rocky POUR, PA-C 08/29/24 1433

## 2024-08-29 NOTE — ED Triage Notes (Signed)
 Pt c/o right shoulder and right leg pain for the past 3-4 days, denies any fall or injury. Pt taking Tylenol  with Arthritis with no relief.

## 2024-09-01 ENCOUNTER — Other Ambulatory Visit: Payer: Self-pay

## 2024-09-03 ENCOUNTER — Other Ambulatory Visit: Payer: Self-pay | Admitting: *Deleted

## 2024-09-03 ENCOUNTER — Other Ambulatory Visit: Payer: Self-pay

## 2024-09-03 MED FILL — Insulin Regular (Human) Soln Pen-Injector 500 Unit/ML: SUBCUTANEOUS | 25 days supply | Qty: 12 | Fill #2 | Status: AC

## 2024-09-03 NOTE — Patient Instructions (Signed)
 Whitney Benton - I am sorry I was unable to reach you today for our scheduled appointment. I work with [name], MD and am calling to support your healthcare needs.  I have rescheduled your appointment for 9/24/ 25 @ 10 am. Please contact me at [phone] should you need to reschedule or have any needs. I look forward to speaking with you soon.   Thank you,   Rayelynn Loyal, RN, BSN, ACM RN Care Manager [employer] [phone]

## 2024-09-06 DIAGNOSIS — G4733 Obstructive sleep apnea (adult) (pediatric): Secondary | ICD-10-CM | POA: Diagnosis not present

## 2024-09-08 ENCOUNTER — Other Ambulatory Visit: Payer: Self-pay

## 2024-09-08 DIAGNOSIS — H0102B Squamous blepharitis left eye, upper and lower eyelids: Secondary | ICD-10-CM | POA: Diagnosis not present

## 2024-09-08 DIAGNOSIS — H353131 Nonexudative age-related macular degeneration, bilateral, early dry stage: Secondary | ICD-10-CM | POA: Diagnosis not present

## 2024-09-08 DIAGNOSIS — H0102A Squamous blepharitis right eye, upper and lower eyelids: Secondary | ICD-10-CM | POA: Diagnosis not present

## 2024-09-08 LAB — HM DIABETES EYE EXAM

## 2024-09-10 ENCOUNTER — Telehealth: Payer: Self-pay | Admitting: Internal Medicine

## 2024-09-10 ENCOUNTER — Ambulatory Visit: Admitting: Podiatry

## 2024-09-10 NOTE — Telephone Encounter (Signed)
 Lvm to confirm appt for 9/22

## 2024-09-13 ENCOUNTER — Ambulatory Visit: Attending: Internal Medicine | Admitting: Internal Medicine

## 2024-09-13 ENCOUNTER — Encounter: Payer: Self-pay | Admitting: Internal Medicine

## 2024-09-13 DIAGNOSIS — E1069 Type 1 diabetes mellitus with other specified complication: Secondary | ICD-10-CM

## 2024-09-13 DIAGNOSIS — Z79899 Other long term (current) drug therapy: Secondary | ICD-10-CM

## 2024-09-13 DIAGNOSIS — E785 Hyperlipidemia, unspecified: Secondary | ICD-10-CM | POA: Diagnosis not present

## 2024-09-13 DIAGNOSIS — Z87891 Personal history of nicotine dependence: Secondary | ICD-10-CM | POA: Diagnosis not present

## 2024-09-13 DIAGNOSIS — Z794 Long term (current) use of insulin: Secondary | ICD-10-CM

## 2024-09-13 DIAGNOSIS — Z2821 Immunization not carried out because of patient refusal: Secondary | ICD-10-CM | POA: Diagnosis not present

## 2024-09-13 DIAGNOSIS — G4733 Obstructive sleep apnea (adult) (pediatric): Secondary | ICD-10-CM

## 2024-09-13 DIAGNOSIS — G5603 Carpal tunnel syndrome, bilateral upper limbs: Secondary | ICD-10-CM | POA: Diagnosis not present

## 2024-09-13 DIAGNOSIS — Z6841 Body Mass Index (BMI) 40.0 and over, adult: Secondary | ICD-10-CM | POA: Diagnosis not present

## 2024-09-13 DIAGNOSIS — E1169 Type 2 diabetes mellitus with other specified complication: Secondary | ICD-10-CM

## 2024-09-13 NOTE — Progress Notes (Signed)
 Patient ID: MOMO BRAUN, female    DOB: [birth year]  MRN: [id number]  CC: Diabetes (DM & HTN f/u. Med refills. /No questions / concerns/No to all vax)   Subjective: Whitney Benton is a 53 y.o. female who presents for chronic ds management. Her concerns today include:  Pt with hx of depression, migraines (Dr. Skeet), mixed incontinence, DM type 1 with mild gastroparesis on gastric emptying study, obesity, HL, vit D def, former smoker, on metoprolol  for tachycardia, mild nonobstructive CAD with calcium  score of 1.  Started on Norvasc  for possible vasospasms   Discussed the use of AI scribe software for clinical note transcription with the patient, who gave verbal consent to proceed.  History of Present Illness   Whitney Benton is a 53 year old female with diabetes, hyperlipidemia, and sleep apnea who presents for follow-up of her chronic medical conditions.  DM 1: Followed by endo Dr. Kellie. She manages her diabetes with U500 insulin  therapy, taking 130 units with breakfast and 80-85 units with supper. Her endocrinologist recently stopped metformin  due to gastrointestinal upset. Her A1c has improved from 10 to 8. She has adopted better eating habits, including more vegetables and no sugary drinks, but has not been exercising due to a busy schedule. She cannot use Ozempic or Mounjaro due to gastroparesis.  Her weight has increased from 238 lbs in May to 253 lbs currently. She reports eating small portions, using 'plates for kids' and taking 'a spoonful of everything.'   HL: she is on atorvastatin  40 mg daily, which has reduced her LDL cholesterol from 105 to 95, though the goal is to get it below 70. She is taking the medication consistently.  OSA: She uses a CPAP machine consistently for sleep apnea. Her pulmonologist advised decreasing the Dulera inhaler to once a day or as needed, as her pulmonary function tests were normal. No shortness of breath is reported.  MDD/GAD: Regarding her  mental health, she continues to follow up with her behavioral health specialist. Her medications include Buspar  30 mg BID, Cymbalta  increased to 60 mg twice a day, and trazodone  increased to 150 mg. She feels stable on these medications.  CTS BL: Sees Dr. Jerri. She has carpal tunnel syndrome and was advised to use braces. Surgery was discussed but deferred until her A1c was below 8. She has been using the braces but forgot them today.        Patient Active Problem List   Diagnosis Date Noted   Right carpal tunnel syndrome 07/30/2024   Left carpal tunnel syndrome 07/30/2024   Uncontrolled type 2 diabetes mellitus with hyperglycemia, with long-term current use of insulin  (HCC) 04/30/2024   Intractable nausea and vomiting 12/23/2023   Diabetic gastroparesis (HCC) 12/22/2023   DKA, type 1 (HCC) 06/20/2023   Essential hypertension 06/20/2023   Vitamin D  deficiency 06/20/2023   Hyperlipidemia 03/06/2023   Primary insomnia 12/27/2022   Gallstone pancreatitis 12/03/2022   Chronic cholecystitis with calculus 12/03/2022   Morbid obesity (HCC) 04/04/2022   Former smoker 04/30/2021   Major depressive disorder, recurrent episode, moderate (HCC) 04/30/2021   Substance induced mood disorder (HCC) 03/22/2021   Generalized anxiety disorder 03/22/2021   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   DKA (diabetic ketoacidosis) (HCC) 01/23/2021   Glaucoma suspect 04/12/2020   DKA, type 2, not at goal [facility]) 10/02/2019   AKI (acute kidney injury) (HCC) 10/02/2019   Hyperkalemia 10/02/2019   Leukocytosis 10/02/2019   Abnormal LFTs 10/02/2019   Stressful life  events affecting family and household 08/06/2019   New onset type 2 diabetes mellitus (HCC) 02/04/2019   Morbid obesity with BMI of 40.0-44.9, adult (HCC) 02/04/2019   Tobacco dependence 02/04/2019   Left arm weakness 12/06/2014   Paresthesias/numbness 12/06/2014   Tobacco abuse 11/14/2014   Depression    Mixed incontinence 06/27/2014    History of TVH in 2006 for fibroids and menorrhagia; benign pathology 09/08/2011     Current Outpatient Medications on File Prior to Visit  Medication Sig Dispense Refill   Accu-Chek Softclix Lancets lancets Use to check blood sugar 3 times daily 100 each 6   acetaminophen  (TYLENOL ) 650 MG CR tablet Take 650 mg by mouth every 8 (eight) hours as needed for pain.     acetaminophen  (TYLENOL ) 650 MG CR tablet Take 650 mg by mouth every 8 (eight) hours as needed for pain.     AIMOVIG  140 MG/ML SOAJ SMARTSIG:140 Milligram(s) SUB-Q Every 4 Weeks     albuterol  (VENTOLIN  HFA) 108 (90 Base) MCG/ACT inhaler Inhale 2 puffs into the lungs every 6 (six) hours as needed for wheezing or shortness of breath (Cough). 18 g 0   amLODipine  (NORVASC ) 10 MG tablet Take 1 tablet (10 mg total) by mouth daily. 90 tablet 1   atorvastatin  (LIPITOR) 40 MG tablet Take 1 tablet (40 mg total) by mouth daily. 90 tablet 2   baclofen  (LIORESAL ) 10 MG tablet Take 1 tablet (10 mg total) by mouth 2 (two) times daily as needed for muscle spasms. 14 each 0   Blood Glucose Monitoring Suppl (ACCU-CHEK GUIDE) w/Device KIT Use to check blood sugar 3 times daily. 1 kit 0   Blood Pressure Monitoring (BLOOD PRESSURE CUFF) MISC 1 each by Does not apply route daily. Please provide Large cuff 1 each 0   busPIRone  (BUSPAR ) 30 MG tablet Take 1 tablet (30 mg total) by mouth 2 (two) times daily. 60 tablet 3   Cholecalciferol  (VITAMIN D3) 10 MCG (400 UNIT) CAPS Take 400 Units by mouth daily.     ciclopirox  (PENLAC ) 8 % solution Apply topically at bedtime. Apply over nail and surrounding skin. Apply daily over previous coat. After seven (7) days, may remove with alcohol and continue cycle. 6.6 mL 12   Continuous Glucose Sensor (DEXCOM G7 SENSOR) MISC Change sensor every 10 days. 9 each 3   cycloSPORINE  (RESTASIS ) 0.05 % ophthalmic emulsion Apply 1 drop into both eyes twice a day 180 each 3   DULoxetine  (CYMBALTA ) 60 MG capsule Take 1 capsule (60 mg  total) by mouth 2 (two) times daily. 30 capsule 3   fluticasone  (FLONASE ) 50 MCG/ACT nasal spray Place 1 spray into both nostrils daily as needed for allergies or rhinitis. 16 g 1   gabapentin  (NEURONTIN ) 300 MG capsule Take 1 capsule (300 mg total) by mouth 3 (three) times daily. 90 capsule 3   Glucagon  (BAQSIMI  ONE PACK) 3 MG/DOSE POWD Place 1 Device into the nose as needed (Low blood sugar with impaired consciousness). 2 each 3   glucose blood (ACCU-CHEK GUIDE) test strip Use to check blood sugar 3 times daily 100 each 6   hydrOXYzine  (ATARAX ) 50 MG tablet Take 1 tablet (50 mg total) by mouth 3 (three) times daily. (Patient taking differently: Take 50 mg by mouth every 8 (eight) hours as needed.) 90 tablet 3   Insulin  Pen Needle (PEN NEEDLES) 31G X 5 MM MISC Use to inject insulin  three times daily. 100 each 6   insulin  regular human CONCENTRATED (HUMULIN  R  U-500 KWIKPEN) 500 UNIT/ML KwikPen Inject 85-95 Units into the skin daily with breakfast AND 75-80 Units daily before lunch AND 65-70 Units daily before supper. Take 30 minutes before meals. 12 mL 3   lidocaine  (XYLOCAINE ) 5 % ointment Apply 1 Application topically 3 (three) times daily as needed. 50 g 0   loratadine  (CLARITIN ) 10 MG tablet Take 1 tablet (10 mg total) by mouth daily as needed for allergies. 30 tablet 3   metoprolol  tartrate (LOPRESSOR ) 25 MG tablet Take 1 tablet (25 mg total) by mouth 2 (two) times daily. 60 tablet 5   mometasone -formoterol  (DULERA) 100-5 MCG/ACT AERO Inhale 2 puffs into the lungs in the morning and at bedtime. 1 each 5   Multiple Vitamins-Minerals (EYE VITAMINS PO) Take 1 capsule by mouth daily.     OLANZapine -Samidorphan (LYBALVI ) 15-10 MG TABS Take 15 mg by mouth at bedtime. 30 tablet 3   omeprazole  (PRILOSEC) 40 MG capsule TAKE 1 CAPSULE BY MOUTH ONCE DAILY 30 MINUTES  BEFORE BREAKFAST 90 capsule 0   ondansetron  (ZOFRAN -ODT) 4 MG disintegrating tablet Take 1 tablet (4 mg total) by mouth every 8 (eight) hours  as needed for nausea or vomiting. 20 tablet 0   QUEtiapine  (SEROQUEL ) 25 MG tablet Take 1 tablet (25 mg total) by mouth at bedtime as needed. 30 tablet 3   SUMAtriptan  (IMITREX ) 100 MG tablet TAKE 1 TABLET EARLIEST ONSET OF MIGRAINE. MAY REPEAT IN 2 HOURS IF HEADACHE PERSISTS OR RECURS. MAXIMUM 2 TABLETS IN 24 HOURS. 10 tablet 5   traZODone  (DESYREL ) 150 MG tablet Take 1 tablet (150 mg total) by mouth at bedtime as needed for sleep. 30 tablet 3   vitamin B-12 (CYANOCOBALAMIN) 50 MCG tablet Take 50 mcg by mouth daily.     furosemide  (LASIX ) 20 MG tablet Take 1 tablet (20 mg total) by mouth daily as needed (weight gain). (Patient not taking: Reported on 09/13/2024) 30 tablet 0   metoCLOPramide  (REGLAN ) 5 MG tablet Take 1 tablet (5 mg total) by mouth every 8 (eight) hours as needed for nausea. Dose decrease (Patient not taking: Reported on 09/13/2024) 90 tablet 3   No current facility-administered medications on file prior to visit.    Allergies  Allergen Reactions   Aspirin Hives   Trulicity  [Dulaglutide ] Other (See Comments)    DC'd due to chronic gastric and abdominal pain with nausea   Oxycodone  Nausea And Vomiting    Social History   Socioeconomic History   Marital status: Significant Other    Spouse name: Not on file   Number of children: 3   Years of education: 14   Highest education level: Not on file  Occupational History   Not on file  Tobacco Use   Smoking status: Former    Current packs/day: 0.00    Average packs/day: 0.5 packs/day for 13.0 years (6.5 ttl pk-yrs)    Types: Cigarettes    Start date: 2008    Quit date: 2021    Years since quitting: 4.7    Passive exposure: Past   Smokeless tobacco: Never  Vaping Use   Vaping status: Never Used  Substance and Sexual Activity   Alcohol use: Not Currently    Comment: occasional   Drug use: Yes    Types: Marijuana    Comment: occ   Sexual activity: Yes    Birth control/protection: Surgical  Other Topics Concern    Not on file  Social History Narrative   Patient lives at home with mother and father ,  one story    Patient has 3 children    Patient is single   Patient has 14 years of education    Patient is right handed    Caffeine none   Social Drivers of Health   Financial Resource Strain: High Risk (05/26/2024)   Overall Financial Resource Strain (CARDIA)    Difficulty of Paying Living Expenses: Hard  Food Insecurity: Food Insecurity Present (08/17/2024)   Hunger Vital Sign    Worried About Running Out of Food in the Last Year: Sometimes true    Ran Out of Food in the Last Year: Sometimes true  Transportation Needs: No Transportation Needs (08/17/2024)   PRAPARE - Administrator, Civil Service (Medical): No    Lack of Transportation (Non-Medical): No  Physical Activity: Inactive (05/11/2024)   Exercise Vital Sign    Days of Exercise per Week: 0 days    Minutes of Exercise per Session: 0 min  Stress: Stress Concern Present (05/11/2024)   [employer] of Occupational Health - Occupational Stress Questionnaire    Feeling of Stress : Very much  Social Connections: Unknown (05/11/2024)   Social Connection and Isolation Panel    Frequency of Communication with Friends and Family: More than three times a week    Frequency of Social Gatherings with Friends and Family: More than three times a week    Attends Religious Services: More than 4 times per year    Active Member of [employer] or Organizations: Yes    Attends Banker Meetings: 1 to 4 times per year    Marital Status: Patient declined  Intimate Partner Violence: Not At Risk (08/17/2024)   Humiliation, Afraid, Rape, and Kick questionnaire    Fear of Current or Ex-Partner: No    Emotionally Abused: No    Physically Abused: No    Sexually Abused: No    Family History  Problem Relation Age of Onset   Diabetes Mother    Mental illness Mother    Depression Mother    Hypertension Mother    Colon cancer Father 32    Hypertension Father    Diabetes Father    Colon cancer Paternal Grandmother    Esophageal cancer Neg Hx    Stomach cancer Neg Hx    Rectal cancer Neg Hx    Asthma Neg Hx     Past Surgical History:  Procedure Laterality Date   CESAREAN SECTION     UPPER GASTROINTESTINAL ENDOSCOPY     VAGINAL HYSTERECTOMY  12/23/2004   Fibroids, menorrhagia, benign pathology    ROS: Review of Systems Negative except as stated above  PHYSICAL EXAM: BP 98/66 (BP Location: Left Arm, Patient Position: Sitting, Cuff Size: Large)   Pulse 92   Temp 98.3 F (36.8 C) (Oral)   Ht 5' 3 (1.6 m)   Wt 253 lb (114.8 kg)   SpO2 99%   BMI 44.82 kg/m   Wt Readings from Last 3 Encounters:  09/13/24 253 lb (114.8 kg)  08/24/24 255 lb (115.7 kg)  08/12/24 249 lb (112.9 kg)    Physical Exam  General appearance - alert, well appearing, obese middle age AAF and in no distress Mental status - normal mood, behavior, speech, dress, motor activity, and thought processes Chest - clear to auscultation, no wheezes, rales or rhonchi, symmetric air entry Heart - normal rate, regular rhythm, normal S1, S2, no murmurs, rubs, clicks or gallops Extremities - peripheral pulses normal, no pedal edema, no clubbing or cyanosis Diabetic  Foot Exam - Simple   Simple Foot Form Diabetic Foot exam was performed with the following findings: Yes 09/13/2024 10:06 AM  Visual Inspection See comments: Yes Sensation Testing Intact to touch and monofilament testing bilaterally: Yes Pulse Check Posterior Tibialis and Dorsalis pulse intact bilaterally: Yes Comments 1st and 2nd toenails thick BL. Skin on plantar heels thick.        Latest Ref Rng & Units 05/24/2024   10:57 AM 05/01/2024    8:56 AM 04/30/2024   10:17 AM  CMP  Glucose 70 - 99 mg/dL 671  723  621   BUN 6 - 20 mg/dL 13  8  9    Creatinine 0.44 - 1.00 mg/dL 9.01  9.18  9.04   Sodium 135 - 145 mmol/L 138  139  137   Potassium 3.5 - 5.1 mmol/L 3.6  3.7  3.7   Chloride  98 - 111 mmol/L 102  107  98   CO2 22 - 32 mmol/L 23  24  26    Calcium  8.9 - 10.3 mg/dL 9.5  8.5  9.1   Total Protein 6.5 - 8.1 g/dL 7.9   8.2   Total Bilirubin 0.0 - 1.2 mg/dL 0.9   0.8   Alkaline Phos 38 - 126 U/L 108   118   AST 15 - 41 U/L 26   34   ALT 0 - 44 U/L 24   28    Lipid Panel     Component Value Date/Time   CHOL 165 08/24/2024 0816   CHOL 179 03/23/2024 0934   TRIG 150 (H) 08/24/2024 0816   HDL 45 (L) 08/24/2024 0816   HDL 44 03/23/2024 0934   CHOLHDL 3.7 08/24/2024 0816   VLDL 19 11/14/2014 0707   LDLCALC 95 08/24/2024 0816    CBC    Component Value Date/Time   WBC 10.1 05/24/2024 1057   RBC 5.03 05/24/2024 1057   HGB 13.0 05/24/2024 1057   HGB 13.3 12/12/2022 1036   HCT 42.0 05/24/2024 1057   HCT 40.4 12/12/2022 1036   PLT 430 (H) 05/24/2024 1057   PLT 436 12/12/2022 1036   MCV 83.5 05/24/2024 1057   MCV 86 12/12/2022 1036   MCH 25.8 (L) 05/24/2024 1057   MCHC 31.0 05/24/2024 1057   RDW 15.9 (H) 05/24/2024 1057   RDW 12.9 12/12/2022 1036   LYMPHSABS 1.1 06/20/2023 1122   LYMPHSABS 2.4 07/14/2019 1058   MONOABS 0.6 06/20/2023 1122   EOSABS 0.0 06/20/2023 1122   EOSABS 0.1 07/14/2019 1058   BASOSABS 0.0 06/20/2023 1122   BASOSABS 0.0 07/14/2019 1058    ASSESSMENT AND PLAN: 1. Type 1 diabetes mellitus with morbid obesity (HCC) (Primary) Commended her on dietary changes. Encouraged her to start exercising like walking several days a wk. Continue U500 insulin  as prescribed by Dr. Kellie  2. Hyperlipidemia associated with type 2 diabetes mellitus (HCC) Continue Lipitor  3. Bilateral carpal tunnel syndrome Pt will touch base with her ortho to let him know A1C has decreased to see if she can move forward with CT release  4. OSA on CPAP Continue to use consistently  5. Influenza vaccination declined Recommended. Pt declined  6. Pneumococcal vaccination declined Recommended. Pt declined  7. Herpes zoster vaccination  declined Recommended. Pt declined    Patient was given the opportunity to ask questions.  Patient verbalized understanding of the plan and was able to repeat key elements of the plan.   This documentation was completed using Dragon voice recognition  technology.  Any transcriptional errors are unintentional.  No orders of the defined types were placed in this encounter.    Requested Prescriptions    No prescriptions requested or ordered in this encounter    Return in about 4 months (around 01/13/2025).  Barnie Louder, MD, FACP

## 2024-09-13 NOTE — Progress Notes (Deleted)
 ignore

## 2024-09-13 NOTE — Patient Instructions (Signed)
  VISIT SUMMARY: Today, we reviewed your chronic medical conditions, including diabetes, hyperlipidemia, sleep apnea, mental health, and carpal tunnel syndrome. We discussed your current treatments and made some adjustments to your management plan to help improve your overall health.  YOUR PLAN: -TYPE 2 DIABETES MELLITUS: Type 2 diabetes is a condition where your body does not use insulin  properly, leading to high blood sugar levels. Your A1c has improved to 8%, and you should continue your insulin  regimen of 130 units with breakfast and 80-85 units with supper. Keep up with your dietary changes, such as eating more vegetables and avoiding sugary drinks, and try to increase your physical activity.  -GASTROPARESIS: Gastroparesis is a condition that affects the stomach muscles and prevents proper stomach emptying. This limits the use of certain diabetes medications like GLP-1 receptor agonists.  -HYPERLIPIDEMIA: Hyperlipidemia is a condition with high levels of fats (lipids) in your blood. Your LDL cholesterol has improved to 95 mg/dL, but the goal is to get it below 70 mg/dL. Continue taking atorvastatin  40 mg daily.  -OBESITY: Obesity is a condition of having excess body weight. Your weight has increased to 253 lbs. Continue with portion control and dietary modifications, and try to increase your physical activity.  -OBSTRUCTIVE SLEEP APNEA: Obstructive sleep apnea is a condition where your breathing stops and starts during sleep. You are managing this well with consistent use of your CPAP machine. Continue using the CPAP and use the Dulera inhaler as needed.  -DEPRESSION AND ANXIETY: Depression and anxiety are mental health conditions that affect your mood and feelings. You are stable on your current medications, which include Cymbalta  60 mg twice daily, trazodone  150 mg, and Buspar . Continue following up with your behavioral health specialist.  -CARPAL TUNNEL SYNDROME: Carpal tunnel syndrome is a  condition that causes pain, numbness, and tingling in the hand and arm. You are using wrist braces and are eligible for surgery now that your A1c is at 8%. Contact your orthopedic specialist to schedule the surgery and continue using the wrist braces, ensuring they are not too tight.  INSTRUCTIONS: Please contact your orthopedic specialist to schedule your carpal tunnel release surgery. Continue with your current medications and lifestyle changes, and try to increase your physical activity. Follow up with your behavioral health specialist as scheduled.                      Contains text generated by Abridge.                                 Contains text generated by Abridge.

## 2024-09-13 NOTE — Progress Notes (Deleted)
 NEUROLOGY FOLLOW UP OFFICE NOTE  Whitney Benton [id number]  Assessment/Plan:   Migraine without aura, without Status Migrainosus, Not Intractable Bilateral carpal tunnel syndrome     Migraine prevention:  Aimovig  140mg  Migraine rescue:  Sumatriptan  100mg   Limit use of pain relievers to no more than 2 days out of week to prevent risk of rebound or medication-overuse headache. Keep headache diary Try to optimize glycemic control (Hgb A1c goal less than 8) so can undergo carpal tunnel surgery.   Follow up 6 months.     Subjective:  Whitney Benton is a 53 year old right-handed female with diabetes and gastroparesis who follows up for migraines and bilateral occipital neuralgia.     UPDATE: Continues to do well on Aimovig .  *** Intensity:  Moderate to severe Duration:  30 minutes with sumatriptan  but will return after a couple of hours Frequency:  Last migraine was in January.   She hasn't been using the wrist splints because she is back and forth to 2 houses and tends to leave it at the other house. ***.  Hgb A1c from this month was 8% (down from 10% in May)     Rescue therapy:  sumatriptan  100mg    Current NSAIDS:  none - GI bleed Current analgesics:  none Current triptans:  sumatriptan  100mg  Current ergotamine:  none Current anti-emetic:  Zofran  ODT 4mg  Current muscle relaxants:  Flexeril  10mg  PRN Current anti-anxiolytic:  hydroxyzine  Current sleep aide:  melatonin Current Antihypertensive medications:  metoprolol  tartrate Current Antidepressant medications:  Cymbalta  60mg  daily Current Anticonvulsant medications:  gabapentin  300mg  three times daily Current anti-CGRP:  none Current Vitamins/Herbal/Supplements:  Melatonin, B12 Current Antihistamines/Decongestants:  none Other therapy:  none Hormone/birth control:  none   Caffeine:  Cut down on coffee.  Now only decaff.  No soda Diet:  Drinks water  with electrolytes.  Does not skip meals Exercise:  Walks 30 minutes  daily Depression:  yes; Anxiety:  yes Other pain:  no Sleep hygiene:  poor   HISTORY:  Migraines since 2000 but returned around 2019.  Initial workup in early 2000s included lumbar puncture but unsure of the results.  She doesn't remember if she had an MRI of the brain.  They are severe sharp and pounding pain in back of head bilaterally and radiates to the front.  She has associated flashes in her vision, photophobia, phonophobia, osmophobia, feels off-balance and sometimes nausea but no  Autonomic symptoms, numbness or weakness.  They usually last 2-3 weeks.  They occur about twice a year, usually in Spring and Summer but can occur other times.  Triggers unknown.  Nothing really relieves them.   CT brain on 06/14/2020 personally reviewed was normal. Eye exam in 2021 okay.  In November 2015, she began experiencing left arm weakness and pain.  She reports numbness in the fingertips and palm of both hands.  MRI of cervical spine 11/14/2014 showed multilevel disc degeneration and spondylosis with bilateral mild foraminal stenosis and mild spinal stenosis at C5-6.  She had a NCV-EMG which revealed bilateral carpal tunnel syndrome.  She was referred to a hand specialist at that time but never followed up.  She continued to have some numbness in left hand and later involving the right hand.  NCV-EMG on 07/23/2022 revealed evidence of bilateral moderate to severe carpal tunnel syndrome.  Referred to hand specialist.  Surgery was recommended but not until Hgb A1c is under 8.0.   Past NSAIDS:  Meloxicam , ketorolac , ibuprofen , naproxen  Past analgesics:  Tylenol  (  elevated liver function), Excedrin Past abortive triptans:  rizatriptan  10mg .  Sumatriptan  NS too expensive. Past abortive ergotamine:  none Past muscle relaxants:  Robaxin , Flexeril  Past anti-emetic:  promethazine  Past antihypertensive medications:  none Past antidepressant medications:  Nortriptyline , sertraline  50mg  Past anticonvulsant  medications:  topiramate  100mg  Past anti-CGRP:  none Past vitamins/Herbal/Supplements:  none Past antihistamines/decongestants:  Benadryl , Zyrtec  Other past therapies:  none     Family history of headache:  no  PAST MEDICAL HISTORY: Past Medical History:  Diagnosis Date   Allergy    Anxiety    Arthritis    Asthma    Depression    Diabetes mellitus without complication (HCC)    GERD (gastroesophageal reflux disease)    Glaucoma suspect    Hyperlipidemia    Trichomonas infection     MEDICATIONS: Current Outpatient Medications on File Prior to Visit  Medication Sig Dispense Refill   Accu-Chek Softclix Lancets lancets Use to check blood sugar 3 times daily 100 each 6   acetaminophen  (TYLENOL ) 650 MG CR tablet Take 650 mg by mouth every 8 (eight) hours as needed for pain.     acetaminophen  (TYLENOL ) 650 MG CR tablet Take 650 mg by mouth every 8 (eight) hours as needed for pain.     AIMOVIG  140 MG/ML SOAJ SMARTSIG:140 Milligram(s) SUB-Q Every 4 Weeks     albuterol  (VENTOLIN  HFA) 108 (90 Base) MCG/ACT inhaler Inhale 2 puffs into the lungs every 6 (six) hours as needed for wheezing or shortness of breath (Cough). 18 g 0   amLODipine  (NORVASC ) 10 MG tablet Take 1 tablet (10 mg total) by mouth daily. 90 tablet 1   atorvastatin  (LIPITOR) 40 MG tablet Take 1 tablet (40 mg total) by mouth daily. 90 tablet 2   baclofen  (LIORESAL ) 10 MG tablet Take 1 tablet (10 mg total) by mouth 2 (two) times daily as needed for muscle spasms. 14 each 0   Blood Glucose Monitoring Suppl (ACCU-CHEK GUIDE) w/Device KIT Use to check blood sugar 3 times daily. 1 kit 0   Blood Pressure Monitoring (BLOOD PRESSURE CUFF) MISC 1 each by Does not apply route daily. Please provide Large cuff 1 each 0   busPIRone  (BUSPAR ) 30 MG tablet Take 1 tablet (30 mg total) by mouth 2 (two) times daily. 60 tablet 3   Cholecalciferol  (VITAMIN D3) 10 MCG (400 UNIT) CAPS Take 400 Units by mouth daily.     ciclopirox  (PENLAC ) 8 %  solution Apply topically at bedtime. Apply over nail and surrounding skin. Apply daily over previous coat. After seven (7) days, may remove with alcohol and continue cycle. 6.6 mL 12   Continuous Glucose Sensor (DEXCOM G7 SENSOR) MISC Change sensor every 10 days. 9 each 3   cycloSPORINE  (RESTASIS ) 0.05 % ophthalmic emulsion Apply 1 drop into both eyes twice a day 180 each 3   DULoxetine  (CYMBALTA ) 60 MG capsule Take 1 capsule (60 mg total) by mouth 2 (two) times daily. 30 capsule 3   fluticasone  (FLONASE ) 50 MCG/ACT nasal spray Place 1 spray into both nostrils daily as needed for allergies or rhinitis. 16 g 1   furosemide  (LASIX ) 20 MG tablet Take 1 tablet (20 mg total) by mouth daily as needed (weight gain). 30 tablet 0   gabapentin  (NEURONTIN ) 300 MG capsule Take 1 capsule (300 mg total) by mouth 3 (three) times daily. 90 capsule 3   Glucagon  (BAQSIMI  ONE PACK) 3 MG/DOSE POWD Place 1 Device into the nose as needed (Low blood sugar with impaired consciousness).  2 each 3   glucose blood (ACCU-CHEK GUIDE) test strip Use to check blood sugar 3 times daily 100 each 6   hydrOXYzine  (ATARAX ) 50 MG tablet Take 1 tablet (50 mg total) by mouth 3 (three) times daily. (Patient taking differently: Take 50 mg by mouth every 8 (eight) hours as needed.) 90 tablet 3   Insulin  Pen Needle (PEN NEEDLES) 31G X 5 MM MISC Use to inject insulin  three times daily. 100 each 6   insulin  regular human CONCENTRATED (HUMULIN  R U-500 KWIKPEN) 500 UNIT/ML KwikPen Inject 85-95 Units into the skin daily with breakfast AND 75-80 Units daily before lunch AND 65-70 Units daily before supper. Take 30 minutes before meals. 12 mL 3   lidocaine  (XYLOCAINE ) 5 % ointment Apply 1 Application topically 3 (three) times daily as needed. 50 g 0   loratadine  (CLARITIN ) 10 MG tablet Take 1 tablet (10 mg total) by mouth daily as needed for allergies. 30 tablet 3   metoCLOPramide  (REGLAN ) 10 MG tablet Take 1 tablet (10 mg total) by mouth 3 (three)  times daily before meals. 90 tablet 0   metoCLOPramide  (REGLAN ) 5 MG tablet Take 1 tablet (5 mg total) by mouth every 8 (eight) hours as needed for nausea. Dose decrease 90 tablet 3   metoprolol  tartrate (LOPRESSOR ) 25 MG tablet Take 1 tablet (25 mg total) by mouth 2 (two) times daily. 60 tablet 5   mometasone -formoterol  (DULERA) 100-5 MCG/ACT AERO Inhale 2 puffs into the lungs in the morning and at bedtime. 1 each 5   Multiple Vitamins-Minerals (EYE VITAMINS PO) Take 1 capsule by mouth daily.     OLANZapine -Samidorphan (LYBALVI ) 15-10 MG TABS Take 15 mg by mouth at bedtime. 30 tablet 3   omeprazole  (PRILOSEC) 40 MG capsule TAKE 1 CAPSULE BY MOUTH ONCE DAILY 30 MINUTES  BEFORE BREAKFAST 90 capsule 0   ondansetron  (ZOFRAN -ODT) 4 MG disintegrating tablet Take 1 tablet (4 mg total) by mouth every 8 (eight) hours as needed for nausea or vomiting. 20 tablet 0   QUEtiapine  (SEROQUEL ) 25 MG tablet Take 1 tablet (25 mg total) by mouth at bedtime as needed. 30 tablet 3   SUMAtriptan  (IMITREX ) 100 MG tablet TAKE 1 TABLET EARLIEST ONSET OF MIGRAINE. MAY REPEAT IN 2 HOURS IF HEADACHE PERSISTS OR RECURS. MAXIMUM 2 TABLETS IN 24 HOURS. 10 tablet 5   traZODone  (DESYREL ) 150 MG tablet Take 1 tablet (150 mg total) by mouth at bedtime as needed for sleep. 30 tablet 3   vitamin B-12 (CYANOCOBALAMIN) 50 MCG tablet Take 50 mcg by mouth daily.     No current facility-administered medications on file prior to visit.    ALLERGIES: Allergies  Allergen Reactions   Aspirin Hives   Trulicity  [Dulaglutide ] Other (See Comments)    DC'd due to chronic gastric and abdominal pain with nausea   Oxycodone  Nausea And Vomiting    FAMILY HISTORY: Family History  Problem Relation Age of Onset   Diabetes Mother    Mental illness Mother    Depression Mother    Hypertension Mother    Colon cancer Father 32   Hypertension Father    Diabetes Father    Colon cancer Paternal Grandmother    Esophageal cancer Neg Hx    Stomach  cancer Neg Hx    Rectal cancer Neg Hx    Asthma Neg Hx       Objective:  *** General: No acute distress.  Patient appears well-groomed.   ***    Juliene Dunnings, DO  CC: Barnie Louder, MD

## 2024-09-14 ENCOUNTER — Other Ambulatory Visit: Payer: Self-pay

## 2024-09-14 ENCOUNTER — Ambulatory Visit: Admitting: Neurology

## 2024-09-15 ENCOUNTER — Other Ambulatory Visit: Payer: Self-pay | Admitting: *Deleted

## 2024-09-15 ENCOUNTER — Encounter (HOSPITAL_COMMUNITY): Payer: Self-pay | Admitting: Psychiatry

## 2024-09-15 ENCOUNTER — Encounter: Payer: Self-pay | Admitting: *Deleted

## 2024-09-15 ENCOUNTER — Telehealth (HOSPITAL_COMMUNITY): Admitting: Psychiatry

## 2024-09-15 DIAGNOSIS — F333 Major depressive disorder, recurrent, severe with psychotic symptoms: Secondary | ICD-10-CM

## 2024-09-15 DIAGNOSIS — F411 Generalized anxiety disorder: Secondary | ICD-10-CM

## 2024-09-15 MED ORDER — QUETIAPINE FUMARATE 25 MG PO TABS: 25.0000 mg | ORAL_TABLET | Freq: Every evening | ORAL | 3 refills | Status: DC | PRN

## 2024-09-15 MED ORDER — TRAZODONE HCL 150 MG PO TABS: 150.0000 mg | ORAL_TABLET | Freq: Every evening | ORAL | 3 refills | Status: DC | PRN

## 2024-09-15 MED ORDER — HYDROXYZINE HCL 50 MG PO TABS
50.0000 mg | ORAL_TABLET | Freq: Three times a day (TID) | ORAL | 3 refills | Status: AC
Start: 2024-09-15 — End: ?

## 2024-09-15 MED ORDER — BUSPIRONE HCL 30 MG PO TABS: 30.0000 mg | ORAL_TABLET | Freq: Two times a day (BID) | ORAL | 3 refills | Status: DC

## 2024-09-15 MED ORDER — GABAPENTIN 300 MG PO CAPS
300.0000 mg | ORAL_CAPSULE | Freq: Three times a day (TID) | ORAL | 3 refills | Status: AC
Start: 2024-09-15 — End: ?

## 2024-09-15 MED ORDER — DULOXETINE HCL 60 MG PO CPEP: 60.0000 mg | ORAL_CAPSULE | Freq: Two times a day (BID) | ORAL | 3 refills | Status: DC

## 2024-09-15 MED ORDER — LYBALVI 15-10 MG PO TABS: 15.0000 mg | ORAL_TABLET | Freq: Every evening | ORAL | 3 refills | Status: AC

## 2024-09-15 NOTE — Progress Notes (Signed)
 BH MD/PA/NP OP Progress Note Virtual Visit via Video Note  I connected with Whitney Benton on 09/15/24 at  9:30 AM EDT by a video enabled telemedicine application and verified that I am speaking with the correct person using two identifiers.  Location: Patient: Home Provider: Clinic   I discussed the limitations of evaluation and management by telemedicine and the availability of in person appointments. The patient expressed understanding and agreed to proceed.  I provided 30 minutes of non-face-to-face time during this encounter.          09/15/2024 11:00 AM Whitney Benton  MRN:  [id number]  Chief Complaint: I have to move back with my sister   HPI: 53 year old female seen today for follow up psychiatric evaluation.  She has a psychiatric history of insomnia, substance induced mood disorder, anxiety and depression.   She is currently managed on Buspar  15 mg three times daily, Lybalvi  15-10 mg nightly, hydroxyzine  25 mg three times daily as needed, gabapentin  300 mg 3 times daily, trazodone  50 to 100 mg nightly as needed, Seroquel  25 mg nightly as needed, and Cymbalta  80 mg daily  Today she notes her medications are somewhat effective in managing her psychiatric condition.   Today was well-groomed, pleasant, cooperative, and engaged in conversation.  She reports that she is stressed because she has to move back in with her sister. She notes that her son wants to live alone. She notes that she is financially stressed. She notes that she takes care of her grandchildren  to make some money but notes that it is limited.  Recently she reports that she started studying billing and coding at [employer].  She reports that she does find enjoyment in school.    Since her last visit she reports her anxiety and depression continues to be problematic.  Provider conducted a GAD-7 and patient scored 21.  Provider also conducted PHQ-9 of a score 24.  She endorses adequate sleep and appetite.  Today she  denies SI/HI/AVH, mania, paranoia.  Patient reports the above is situational and request that her medications not be adjusted.  No medication changes made Today.  Patient agreeable to continue medication as prescribed.  No other concerns noted at this time.   Visit Diagnosis:    ICD-10-CM   1. Severe recurrent major depressive disorder with psychotic features (HCC)  F33.3 QUEtiapine  (SEROQUEL ) 25 MG tablet    busPIRone  (BUSPAR ) 30 MG tablet    gabapentin  (NEURONTIN ) 300 MG capsule    traZODone  (DESYREL ) 150 MG tablet    OLANZapine -Samidorphan (LYBALVI ) 15-10 MG TABS    DULoxetine  (CYMBALTA ) 60 MG capsule    2. Generalized anxiety disorder  F41.1 busPIRone  (BUSPAR ) 30 MG tablet    gabapentin  (NEURONTIN ) 300 MG capsule    traZODone  (DESYREL ) 150 MG tablet    DULoxetine  (CYMBALTA ) 60 MG capsule    hydrOXYzine  (ATARAX ) 50 MG tablet             Past Psychiatric History: cocaine use, marijuana use, anxiety and depression  Past Medical History:  Past Medical History:  Diagnosis Date   Allergy    Anxiety    Arthritis    Asthma    Depression    Diabetes mellitus without complication (HCC)    GERD (gastroesophageal reflux disease)    Glaucoma suspect    Hyperlipidemia    Trichomonas infection     Past Surgical History:  Procedure Laterality Date   CESAREAN SECTION     UPPER GASTROINTESTINAL ENDOSCOPY  VAGINAL HYSTERECTOMY  12/23/2004   Fibroids, menorrhagia, benign pathology    Family Psychiatric History: Mother schizophrenia and bipolar disorder  Family History:  Family History  Problem Relation Age of Onset   Diabetes Mother    Mental illness Mother    Depression Mother    Hypertension Mother    Colon cancer Father 99   Hypertension Father    Diabetes Father    Colon cancer Paternal Grandmother    Esophageal cancer Neg Hx    Stomach cancer Neg Hx    Rectal cancer Neg Hx    Asthma Neg Hx     Social History:  Social History   Socioeconomic History    Marital status: Significant Other    Spouse name: Not on file   Number of children: 3   Years of education: 14   Highest education level: Not on file  Occupational History   Not on file  Tobacco Use   Smoking status: Former    Current packs/day: 0.00    Average packs/day: 0.5 packs/day for 13.0 years (6.5 ttl pk-yrs)    Types: Cigarettes    Start date: 2008    Quit date: 2021    Years since quitting: 4.7    Passive exposure: Past   Smokeless tobacco: Never  Vaping Use   Vaping status: Never Used  Substance and Sexual Activity   Alcohol use: Not Currently    Comment: occasional   Drug use: Yes    Types: Marijuana    Comment: occ   Sexual activity: Yes    Birth control/protection: Surgical  Other Topics Concern   Not on file  Social History Narrative   Patient lives at home with mother and father , one story    Patient has 3 children    Patient is single   Patient has 14 years of education    Patient is right handed    Caffeine none   Social Drivers of Corporate investment banker Strain: High Risk (05/26/2024)   Overall Financial Resource Strain (CARDIA)    Difficulty of Paying Living Expenses: Hard  Food Insecurity: Food Insecurity Present (08/17/2024)   Hunger Vital Sign    Worried About Running Out of Food in the Last Year: Sometimes true    Ran Out of Food in the Last Year: Sometimes true  Transportation Needs: No Transportation Needs (08/17/2024)   PRAPARE - Administrator, Civil Service (Medical): No    Lack of Transportation (Non-Medical): No  Physical Activity: Inactive (05/11/2024)   Exercise Vital Sign    Days of Exercise per Week: 0 days    Minutes of Exercise per Session: 0 min  Stress: Stress Concern Present (05/11/2024)   [employer] of Occupational Health - Occupational Stress Questionnaire    Feeling of Stress : Very much  Social Connections: Unknown (05/11/2024)   Social Connection and Isolation Panel    Frequency of Communication  with Friends and Family: More than three times a week    Frequency of Social Gatherings with Friends and Family: More than three times a week    Attends Religious Services: More than 4 times per year    Active Member of [employer] or Organizations: Yes    Attends Banker Meetings: 1 to 4 times per year    Marital Status: Patient declined    Allergies:  Allergies  Allergen Reactions   Aspirin Hives   Trulicity  [Dulaglutide ] Other (See Comments)    DC'd due to  chronic gastric and abdominal pain with nausea   Oxycodone  Nausea And Vomiting    Metabolic Disorder Labs: Lab Results  Component Value Date   HGBA1C 8.0 (A) 08/24/2024   MPG 240.3 05/01/2024   MPG 235 02/18/2024   No results found for: PROLACTIN Lab Results  Component Value Date   CHOL 165 08/24/2024   TRIG 150 (H) 08/24/2024   HDL 45 (L) 08/24/2024   CHOLHDL 3.7 08/24/2024   VLDL 19 11/14/2014   LDLCALC 95 08/24/2024   LDLCALC 105 (H) 03/31/2024   Lab Results  Component Value Date   TSH 2.220 03/23/2024   TSH 2.460 04/30/2021    Therapeutic Level Labs: No results found for: LITHIUM No results found for: VALPROATE No results found for: CBMZ  Current Medications: Current Outpatient Medications  Medication Sig Dispense Refill   Accu-Chek Softclix Lancets lancets Use to check blood sugar 3 times daily 100 each 6   acetaminophen  (TYLENOL ) 650 MG CR tablet Take 650 mg by mouth every 8 (eight) hours as needed for pain.     acetaminophen  (TYLENOL ) 650 MG CR tablet Take 650 mg by mouth every 8 (eight) hours as needed for pain.     AIMOVIG  140 MG/ML SOAJ SMARTSIG:140 Milligram(s) SUB-Q Every 4 Weeks     albuterol  (VENTOLIN  HFA) 108 (90 Base) MCG/ACT inhaler Inhale 2 puffs into the lungs every 6 (six) hours as needed for wheezing or shortness of breath (Cough). 18 g 0   amLODipine  (NORVASC ) 10 MG tablet Take 1 tablet (10 mg total) by mouth daily. 90 tablet 1   atorvastatin  (LIPITOR) 40 MG tablet Take  1 tablet (40 mg total) by mouth daily. 90 tablet 2   baclofen  (LIORESAL ) 10 MG tablet Take 1 tablet (10 mg total) by mouth 2 (two) times daily as needed for muscle spasms. 14 each 0   Blood Glucose Monitoring Suppl (ACCU-CHEK GUIDE) w/Device KIT Use to check blood sugar 3 times daily. 1 kit 0   Blood Pressure Monitoring (BLOOD PRESSURE CUFF) MISC 1 each by Does not apply route daily. Please provide Large cuff 1 each 0   busPIRone  (BUSPAR ) 30 MG tablet Take 1 tablet (30 mg total) by mouth 2 (two) times daily. 60 tablet 3   Cholecalciferol  (VITAMIN D3) 10 MCG (400 UNIT) CAPS Take 400 Units by mouth daily.     ciclopirox  (PENLAC ) 8 % solution Apply topically at bedtime. Apply over nail and surrounding skin. Apply daily over previous coat. After seven (7) days, may remove with alcohol and continue cycle. 6.6 mL 12   Continuous Glucose Sensor (DEXCOM G7 SENSOR) MISC Change sensor every 10 days. 9 each 3   cycloSPORINE  (RESTASIS ) 0.05 % ophthalmic emulsion Apply 1 drop into both eyes twice a day 180 each 3   DULoxetine  (CYMBALTA ) 60 MG capsule Take 1 capsule (60 mg total) by mouth 2 (two) times daily. 30 capsule 3   fluticasone  (FLONASE ) 50 MCG/ACT nasal spray Place 1 spray into both nostrils daily as needed for allergies or rhinitis. 16 g 1   furosemide  (LASIX ) 20 MG tablet Take 1 tablet (20 mg total) by mouth daily as needed (weight gain). (Patient not taking: Reported on 09/13/2024) 30 tablet 0   gabapentin  (NEURONTIN ) 300 MG capsule Take 1 capsule (300 mg total) by mouth 3 (three) times daily. 90 capsule 3   Glucagon  (BAQSIMI  ONE PACK) 3 MG/DOSE POWD Place 1 Device into the nose as needed (Low blood sugar with impaired consciousness). 2 each 3  glucose blood (ACCU-CHEK GUIDE) test strip Use to check blood sugar 3 times daily 100 each 6   hydrOXYzine  (ATARAX ) 50 MG tablet Take 1 tablet (50 mg total) by mouth 3 (three) times daily. 90 tablet 3   Insulin  Pen Needle (PEN NEEDLES) 31G X 5 MM MISC Use to  inject insulin  three times daily. 100 each 6   insulin  regular human CONCENTRATED (HUMULIN  R U-500 KWIKPEN) 500 UNIT/ML KwikPen Inject 85-95 Units into the skin daily with breakfast AND 75-80 Units daily before lunch AND 65-70 Units daily before supper. Take 30 minutes before meals. 12 mL 3   lidocaine  (XYLOCAINE ) 5 % ointment Apply 1 Application topically 3 (three) times daily as needed. 50 g 0   loratadine  (CLARITIN ) 10 MG tablet Take 1 tablet (10 mg total) by mouth daily as needed for allergies. 30 tablet 3   metoCLOPramide  (REGLAN ) 5 MG tablet Take 1 tablet (5 mg total) by mouth every 8 (eight) hours as needed for nausea. Dose decrease (Patient not taking: Reported on 09/13/2024) 90 tablet 3   metoprolol  tartrate (LOPRESSOR ) 25 MG tablet Take 1 tablet (25 mg total) by mouth 2 (two) times daily. 60 tablet 5   mometasone -formoterol  (DULERA) 100-5 MCG/ACT AERO Inhale 2 puffs into the lungs in the morning and at bedtime. 1 each 5   Multiple Vitamins-Minerals (EYE VITAMINS PO) Take 1 capsule by mouth daily.     OLANZapine -Samidorphan (LYBALVI ) 15-10 MG TABS Take 15 mg by mouth at bedtime. 30 tablet 3   omeprazole  (PRILOSEC) 40 MG capsule TAKE 1 CAPSULE BY MOUTH ONCE DAILY 30 MINUTES  BEFORE BREAKFAST 90 capsule 0   ondansetron  (ZOFRAN -ODT) 4 MG disintegrating tablet Take 1 tablet (4 mg total) by mouth every 8 (eight) hours as needed for nausea or vomiting. 20 tablet 0   QUEtiapine  (SEROQUEL ) 25 MG tablet Take 1 tablet (25 mg total) by mouth at bedtime as needed. 30 tablet 3   SUMAtriptan  (IMITREX ) 100 MG tablet TAKE 1 TABLET EARLIEST ONSET OF MIGRAINE. MAY REPEAT IN 2 HOURS IF HEADACHE PERSISTS OR RECURS. MAXIMUM 2 TABLETS IN 24 HOURS. 10 tablet 5   traZODone  (DESYREL ) 150 MG tablet Take 1 tablet (150 mg total) by mouth at bedtime as needed for sleep. 30 tablet 3   vitamin B-12 (CYANOCOBALAMIN) 50 MCG tablet Take 50 mcg by mouth daily.     No current facility-administered medications for this visit.      Musculoskeletal: Strength & Muscle Tone: within normal limits and telehealth visit Gait & Station: normal, telehealth visit Patient leans: N/A  Psychiatric Specialty Exam: Review of Systems  There were no vitals taken for this visit.There is no height or weight on file to calculate BMI.  General Appearance: Well Groomed  Eye Contact:  Good  Speech:  Clear and Coherent  Volume:  Normal  Mood:  Anxious and Depressed  Affect:  Appropriate and Congruent  Thought Process:  Coherent, Goal Directed, and Linear  Orientation:  Full (Time, Place, and Person)  Thought Content: WDL and Logical,   Suicidal Thoughts:  No  Homicidal Thoughts:  No  Memory:  Immediate;   Good Recent;   Good Remote;   Good  Judgement:  Good  Insight:  Good  Psychomotor Activity:  Normal  Concentration:  Concentration: Good and Attention Span: Good  Recall:  Good  Fund of Knowledge: Good  Language: Good  Akathisia:  No  Handed:  Right  AIMS (if indicated):  not done,   Assets:  Manufacturing systems engineer  Desire for Improvement Housing Intimacy Physical Health Social Support  ADL's:  Intact  Cognition: WNL  Sleep:  Fair   Screenings: AIMS    Flowsheet Row Clinical Support from 07/28/2024 in [facility] Clinical Support from 01/27/2024 in [facility]  AIMS Total Score 0 0   GAD-7    Flowsheet Row Office Visit from 09/13/2024 in [facility] [address] [place] - A Dept Of [place]. [facility] Patient Outreach Telephone from 08/17/2024 in [place] POPUL[facility] DEPARTMENT Clinical Support from 07/28/2024 in [facility] Clinical Support from 05/27/2024 in [facility] Office Visit from 05/11/2024 in [facility] [place] - A Dept Of [place]. [facility]  Total GAD-7 Score 21 21 21 21 21    PHQ2-9    Flowsheet Row Office Visit from 09/13/2024 in [facility] - A Dept Of [place]. [facility] Patient Outreach Telephone from 08/17/2024 in [place] POPULATION HEALTH DEPARTMENT Clinical Support from 07/28/2024 in [facility] Clinical Support from 05/27/2024 in [facility] Office Visit from 05/11/2024 in [facility] [place] - A Dept Of [place]. [facility]  PHQ-2 Total Score 6 6 6 6 6   PHQ-9 Total Score 24 -- 24 24 21    Flowsheet Row UC from 08/29/2024 in [facility] Clinical Support from 07/28/2024 in [facility] UC from 07/15/2024 in [facility] at [facility] RISK CATEGORY No Risk No Risk No Risk     Assessment and Plan: Patient notes that her anxiety and depression continues to be bothersome however reports that it is situational.  At this time she request that her medications be changed.  She will continue medication as prescribed.    1. Severe recurrent major depressive disorder with psychotic features (HCC)  Continue- busPIRone  (BUSPAR ) 15 MG tablet; Take 1 tablet (15 mg total) by mouth 3 (three) times daily.  Dispense: 90 tablet; Refill: 3 Continue- OLANZapine -Samidorphan (LYBALVI ) 15-10 MG TABS; Take 15 mg by mouth at bedtime.  Dispense: 30 tablet; Refill: 3 Continue- DULoxetine  (CYMBALTA ) 20 MG capsule; Take 1 capsule (20 mg total) by mouth every morning.  Dispense: 30 capsule; Refill: 3 Continue- gabapentin  (NEURONTIN ) 300 MG capsule; Take 1 capsule (300 mg total) by mouth 3 (three) times daily.  Dispense: 90 capsule; Refill: 3 Continue- traZODone  (DESYREL ) 50 MG tablet; Take 1-2 tablets (50-100 mg total) by mouth at bedtime as needed for sleep.  Dispense: 60 tablet; Refill: 3 Continue- DULoxetine  (CYMBALTA ) 60 MG capsule; Take 1 capsule (60 mg total) by mouth daily.  Dispense: 30 capsule; Refill: 3 Continue- QUEtiapine  (SEROQUEL ) 25 MG tablet; Take 1 tablet (25 mg total) by  mouth at bedtime as needed.  Dispense: 30 tablet; Refill: 3  2. Generalized anxiety disorder  Continue- busPIRone  (BUSPAR ) 15 MG tablet; Take 1 tablet (15 mg total) by mouth 3 (three) times daily.  Dispense: 90 tablet; Refill: 3 Continue- DULoxetine  (CYMBALTA ) 20 MG capsule; Take 1 capsule (20 mg total) by mouth every morning.  Dispense: 30 capsule; Refill: 3 Continue- gabapentin  (NEURONTIN ) 300 MG capsule; Take 1 capsule (300 mg total) by mouth 3 (three) times daily.  Dispense: 90 capsule; Refill: 3 Continue- hydrOXYzine  (ATARAX ) 50 MG tablet; Take 1 tablet (50 mg total) by mouth 3 (three) times daily.  Dispense: 90 tablet; Refill: 3 Continue- traZODone  (  DESYREL ) 50 MG tablet; Take 1-2 tablets (50-100 mg total) by mouth at bedtime as needed for sleep.  Dispense: 60 tablet; Refill: 3 Continue- DULoxetine  (CYMBALTA ) 60 MG capsule; Take 1 capsule (60 mg total) by mouth daily.  Dispense: 30 capsule; Refill: 3      Follow-up in 3 months Follow-up with therapy   Zane FORBES Bach, NP 09/15/2024, 11:00 AM

## 2024-09-15 NOTE — Patient Instructions (Addendum)
 Whitney Benton - I am sorry I was unable to reach you today for our scheduled appointment. I work with [name], MD and am calling to support your healthcare needs. Please contact me at [phone] at your earliest convenience. I have rescheduled your appointment for 09/30/24 @ 11 am.   I look forward to speaking with you soon.   Thank you,  Trinita Devlin, RN, BSN, ACM RN Care Manager [employer] [phone]

## 2024-09-16 ENCOUNTER — Ambulatory Visit: Attending: Cardiology | Admitting: Cardiology

## 2024-09-16 ENCOUNTER — Encounter: Payer: Self-pay | Admitting: Cardiology

## 2024-09-16 VITALS — BP 122/88 | HR 103 | Ht 63.0 in | Wt 258.6 lb

## 2024-09-16 DIAGNOSIS — E782 Mixed hyperlipidemia: Secondary | ICD-10-CM | POA: Insufficient documentation

## 2024-09-16 DIAGNOSIS — I2510 Atherosclerotic heart disease of native coronary artery without angina pectoris: Secondary | ICD-10-CM | POA: Diagnosis not present

## 2024-09-16 DIAGNOSIS — R0602 Shortness of breath: Secondary | ICD-10-CM | POA: Diagnosis not present

## 2024-09-16 MED ORDER — EZETIMIBE 10 MG PO TABS: 10.0000 mg | ORAL_TABLET | Freq: Every day | ORAL | 3 refills | Status: AC

## 2024-09-16 NOTE — Patient Instructions (Addendum)
 Medication Instructions:  Your physician has recommended you make the following change in your medication:  START: Zetia  10 mg once daily  *If you need a refill on your cardiac medications before your next appointment, please call your pharmacy*  Lab Work: In 12 weeks: Lipids, Lp(a) If you have labs (blood work) drawn today and your tests are completely normal, you will receive your results only by: MyChart Message (if you have MyChart) OR A paper copy in the mail If you have any lab test that is abnormal or we need to change your treatment, we will call you to review the results.  Follow-Up: At [facility], you and your health needs are our priority.  As part of our continuing mission to provide you with exceptional heart care, our providers are all part of one team.  This team includes your primary Cardiologist (physician) and Advanced Practice Providers or APPs (Physician Assistants and Nurse Practitioners) who all work together to provide you with the care you need, when you need it.  Your next appointment:   1 year(s)  Provider:   Kardie Tobb, DO

## 2024-09-16 NOTE — Progress Notes (Unsigned)
 NEUROLOGY FOLLOW UP OFFICE NOTE  BREAWNA MONTENEGRO [id number]  Assessment/Plan:   Migraine without aura, without Status Migrainosus, Not Intractable Hypertension.  Usually controlled.  Elevated today.     Migraine prevention:  Aimovig  140mg  Migraine rescue:  sumatriptan  100mg  Lifestyle modification: Limit use of pain relievers to no more than 9 days out of the month to prevent risk of rebound or medication-overuse headache. Diet modification/hydration/caffeine cessation Routine exercise Sleep hygiene Consider vitamins/supplements:  magnesium  citrate 400mg  daily, riboflavin 400mg  daily, CoQ10 100mg  three times daily Keep headache diary Contact PCP regarding elevated blood pressure Follow up one year      Subjective:  Marnette R. Bauer is a 53 year old right-handed female with diabetes and gastroparesis who follows up for migraines and bilateral occipital neuralgia.     UPDATE: Continues to do well on Aimovig .   Intensity:  Moderate to severe Duration:  30 minutes with sumatriptan   Frequency:  infrequent.  Only gets a migraine if the weather is really hot.       Rescue therapy:  sumatriptan  100mg    Current NSAIDS:  none - GI bleed Current analgesics:  Tylenol  Current triptans:  sumatriptan  100mg  Current ergotamine:  none Current anti-emetic:  Zofran  ODT 4mg  Current muscle relaxants:  baclofen  2mg  BID PRN Current Antihypertensive medications:  metoprolol  tartrate, amlodipine , furosemide  PRN Current Antidepressant medications:  Cymbalta  60mg  daily Current Anticonvulsant medications:  gabapentin  300mg  three times daily Current anti-CGRP:  Aimovig  140mg  Current Vitamins/Herbal/Supplements:  Melatonin, B12 Current Antihistamines/Decongestants:  loratadine , Flonase  Other therapy:  none Other medications:  Seroquel  25mg  at bedtime PRN, olanzapine -samidorphan, hydroxyzine , buspirone    Caffeine:  Cut down on coffee.  Now only decaff.  No soda Diet:  Drinks water  with  electrolytes.  Does not skip meals Exercise:  Walks 30 minutes daily Depression:  yes; Anxiety:  yes Other pain:  no Sleep hygiene:  poor   HISTORY:  Migraines since 2000 but returned around 2019.  Initial workup in early 2000s included lumbar puncture but unsure of the results.  She doesn't remember if she had an MRI of the brain.  They are severe sharp and pounding pain in back of head bilaterally and radiates to the front.  She has associated flashes in her vision, photophobia, phonophobia, osmophobia, feels off-balance and sometimes nausea but no  Autonomic symptoms, numbness or weakness.  They usually last 2-3 weeks.  They occur about twice a year, usually in Spring and Summer but can occur other times.  Triggers unknown.  Nothing really relieves them.   CT brain on 06/14/2020 personally reviewed was normal. Eye exam in 2021 okay.  In November 2015, she began experiencing left arm weakness and pain.  She reports numbness in the fingertips and palm of both hands.  MRI of cervical spine 11/14/2014 showed multilevel disc degeneration and spondylosis with bilateral mild foraminal stenosis and mild spinal stenosis at C5-6.  She had a NCV-EMG which revealed bilateral carpal tunnel syndrome.  She was referred to a hand specialist at that time but never followed up.  She continued to have some numbness in left hand and later involving the right hand.  NCV-EMG on 07/23/2022 revealed evidence of bilateral moderate to severe carpal tunnel syndrome.  Referred to hand specialist.  Surgery was recommended but not until Hgb A1c is under 8.0.   Past NSAIDS:  Meloxicam , ketorolac , ibuprofen , naproxen  Past analgesics:  Tylenol  (elevated liver function), Excedrin Past abortive triptans:  rizatriptan  10mg .  Sumatriptan  NS too expensive. Past abortive ergotamine:  none  Past muscle relaxants:  Robaxin , Flexeril  Past anti-emetic:  promethazine  Past antihypertensive medications:  none Past antidepressant medications:   Nortriptyline , sertraline  50mg  Past anticonvulsant medications:  topiramate  100mg  Past anti-CGRP:  none Past vitamins/Herbal/Supplements:  none Past antihistamines/decongestants:  Benadryl , Zyrtec  Other past therapies:  none     Family history of headache:  no  PAST MEDICAL HISTORY: Past Medical History:  Diagnosis Date   Allergy    Anxiety    Arthritis    Asthma    Depression    Diabetes mellitus without complication (HCC)    GERD (gastroesophageal reflux disease)    Glaucoma suspect    Hyperlipidemia    Trichomonas infection     MEDICATIONS: Current Outpatient Medications on File Prior to Visit  Medication Sig Dispense Refill   Accu-Chek Softclix Lancets lancets Use to check blood sugar 3 times daily 100 each 6   acetaminophen  (TYLENOL ) 650 MG CR tablet Take 650 mg by mouth every 8 (eight) hours as needed for pain.     acetaminophen  (TYLENOL ) 650 MG CR tablet Take 650 mg by mouth every 8 (eight) hours as needed for pain.     AIMOVIG  140 MG/ML SOAJ SMARTSIG:140 Milligram(s) SUB-Q Every 4 Weeks     albuterol  (VENTOLIN  HFA) 108 (90 Base) MCG/ACT inhaler Inhale 2 puffs into the lungs every 6 (six) hours as needed for wheezing or shortness of breath (Cough). 18 g 0   amLODipine  (NORVASC ) 10 MG tablet Take 1 tablet (10 mg total) by mouth daily. 90 tablet 1   atorvastatin  (LIPITOR) 40 MG tablet Take 1 tablet (40 mg total) by mouth daily. 90 tablet 2   baclofen  (LIORESAL ) 10 MG tablet Take 1 tablet (10 mg total) by mouth 2 (two) times daily as needed for muscle spasms. 14 each 0   Blood Glucose Monitoring Suppl (ACCU-CHEK GUIDE) w/Device KIT Use to check blood sugar 3 times daily. 1 kit 0   Blood Pressure Monitoring (BLOOD PRESSURE CUFF) MISC 1 each by Does not apply route daily. Please provide Large cuff 1 each 0   busPIRone  (BUSPAR ) 30 MG tablet Take 1 tablet (30 mg total) by mouth 2 (two) times daily. 60 tablet 3   Cholecalciferol  (VITAMIN D3) 10 MCG (400 UNIT) CAPS Take 400  Units by mouth daily.     ciclopirox  (PENLAC ) 8 % solution Apply topically at bedtime. Apply over nail and surrounding skin. Apply daily over previous coat. After seven (7) days, may remove with alcohol and continue cycle. 6.6 mL 12   Continuous Glucose Sensor (DEXCOM G7 SENSOR) MISC Change sensor every 10 days. 9 each 3   cycloSPORINE  (RESTASIS ) 0.05 % ophthalmic emulsion Apply 1 drop into both eyes twice a day 180 each 3   DULoxetine  (CYMBALTA ) 60 MG capsule Take 1 capsule (60 mg total) by mouth 2 (two) times daily. 30 capsule 3   fluticasone  (FLONASE ) 50 MCG/ACT nasal spray Place 1 spray into both nostrils daily as needed for allergies or rhinitis. 16 g 1   furosemide  (LASIX ) 20 MG tablet Take 1 tablet (20 mg total) by mouth daily as needed (weight gain). (Patient not taking: Reported on 09/13/2024) 30 tablet 0   gabapentin  (NEURONTIN ) 300 MG capsule Take 1 capsule (300 mg total) by mouth 3 (three) times daily. 90 capsule 3   Glucagon  (BAQSIMI  ONE PACK) 3 MG/DOSE POWD Place 1 Device into the nose as needed (Low blood sugar with impaired consciousness). 2 each 3   glucose blood (ACCU-CHEK GUIDE) test strip Use to check  blood sugar 3 times daily 100 each 6   hydrOXYzine  (ATARAX ) 50 MG tablet Take 1 tablet (50 mg total) by mouth 3 (three) times daily. 90 tablet 3   Insulin  Pen Needle (PEN NEEDLES) 31G X 5 MM MISC Use to inject insulin  three times daily. 100 each 6   insulin  regular human CONCENTRATED (HUMULIN  R U-500 KWIKPEN) 500 UNIT/ML KwikPen Inject 85-95 Units into the skin daily with breakfast AND 75-80 Units daily before lunch AND 65-70 Units daily before supper. Take 30 minutes before meals. 12 mL 3   lidocaine  (XYLOCAINE ) 5 % ointment Apply 1 Application topically 3 (three) times daily as needed. 50 g 0   loratadine  (CLARITIN ) 10 MG tablet Take 1 tablet (10 mg total) by mouth daily as needed for allergies. 30 tablet 3   metoCLOPramide  (REGLAN ) 5 MG tablet Take 1 tablet (5 mg total) by mouth  every 8 (eight) hours as needed for nausea. Dose decrease (Patient not taking: Reported on 09/13/2024) 90 tablet 3   metoprolol  tartrate (LOPRESSOR ) 25 MG tablet Take 1 tablet (25 mg total) by mouth 2 (two) times daily. 60 tablet 5   mometasone -formoterol  (DULERA) 100-5 MCG/ACT AERO Inhale 2 puffs into the lungs in the morning and at bedtime. 1 each 5   Multiple Vitamins-Minerals (EYE VITAMINS PO) Take 1 capsule by mouth daily.     OLANZapine -Samidorphan (LYBALVI ) 15-10 MG TABS Take 15 mg by mouth at bedtime. 30 tablet 3   omeprazole  (PRILOSEC) 40 MG capsule TAKE 1 CAPSULE BY MOUTH ONCE DAILY 30 MINUTES  BEFORE BREAKFAST 90 capsule 0   ondansetron  (ZOFRAN -ODT) 4 MG disintegrating tablet Take 1 tablet (4 mg total) by mouth every 8 (eight) hours as needed for nausea or vomiting. 20 tablet 0   QUEtiapine  (SEROQUEL ) 25 MG tablet Take 1 tablet (25 mg total) by mouth at bedtime as needed. 30 tablet 3   SUMAtriptan  (IMITREX ) 100 MG tablet TAKE 1 TABLET EARLIEST ONSET OF MIGRAINE. MAY REPEAT IN 2 HOURS IF HEADACHE PERSISTS OR RECURS. MAXIMUM 2 TABLETS IN 24 HOURS. 10 tablet 5   traZODone  (DESYREL ) 150 MG tablet Take 1 tablet (150 mg total) by mouth at bedtime as needed for sleep. 30 tablet 3   vitamin B-12 (CYANOCOBALAMIN) 50 MCG tablet Take 50 mcg by mouth daily.     No current facility-administered medications on file prior to visit.    ALLERGIES: Allergies  Allergen Reactions   Aspirin Hives   Trulicity  [Dulaglutide ] Other (See Comments)    DC'd due to chronic gastric and abdominal pain with nausea   Oxycodone  Nausea And Vomiting    FAMILY HISTORY: Family History  Problem Relation Age of Onset   Diabetes Mother    Mental illness Mother    Depression Mother    Hypertension Mother    Colon cancer Father 16   Hypertension Father    Diabetes Father    Colon cancer Paternal Grandmother    Esophageal cancer Neg Hx    Stomach cancer Neg Hx    Rectal cancer Neg Hx    Asthma Neg Hx        Objective:  Blood pressure (!) 171/79, pulse (!) 112, resp. rate 20, weight 260 lb (117.9 kg), SpO2 95%. General: No acute distress.  Patient appears well-groomed.   Head:  Normocephalic/atraumatic Neck:  Supple.  No paraspinal tenderness.  Full range of motion. Heart:  Regular rate and rhythm. Neuro:  Alert and oriented.  Speech fluent and not dysarthric.  Language intact.  CN  II-XII intact.  Bulk and tone normal.  Muscle strength 5/5 throughout.  Sensation to light touch intact.  Deep tendon reflexes 2+ throughout, toes downgoing.  Gait normal.  Romberg negative.     Juliene Dunnings, DO  CC: Barnie Louder, MD

## 2024-09-16 NOTE — Progress Notes (Signed)
 Cardiology Office Note:    Date:  09/18/2024   ID:  Whitney Benton, DOB 07-23-1971, MRN [id number]  PCP:  [name], MD  Cardiologist:  Dub Huntsman, DO  Electrophysiologist:  None   Referring MD: [name], MD    I am always short of breath  History of Present Illness:    Whitney Benton is a 53 y.o. female with a hx of insulin -dependent diabetes type 2, tobacco use, obesity mild nonobstructive coronary artery disease by coronary CT scan.  Her biggest complaint is the fact that she is experiencing shortness of breath.  No chest discomfort.  She reports she is on exertion.  Past Medical History:  Diagnosis Date   Allergy    Anxiety    Arthritis    Asthma    Depression    Diabetes mellitus without complication (HCC)    GERD (gastroesophageal reflux disease)    Glaucoma suspect    Hyperlipidemia    Trichomonas infection     Past Surgical History:  Procedure Laterality Date   CESAREAN SECTION     UPPER GASTROINTESTINAL ENDOSCOPY     VAGINAL HYSTERECTOMY  12/23/2004   Fibroids, menorrhagia, benign pathology    Current Medications: Current Meds  Medication Sig   Accu-Chek Softclix Lancets lancets Use to check blood sugar 3 times daily   acetaminophen  (TYLENOL ) 650 MG CR tablet Take 650 mg by mouth every 8 (eight) hours as needed for pain.   acetaminophen  (TYLENOL ) 650 MG CR tablet Take 650 mg by mouth every 8 (eight) hours as needed for pain.   albuterol  (VENTOLIN  HFA) 108 (90 Base) MCG/ACT inhaler Inhale 2 puffs into the lungs every 6 (six) hours as needed for wheezing or shortness of breath (Cough).   amLODipine  (NORVASC ) 10 MG tablet Take 1 tablet (10 mg total) by mouth daily.   atorvastatin  (LIPITOR) 40 MG tablet Take 1 tablet (40 mg total) by mouth daily.   baclofen  (LIORESAL ) 10 MG tablet Take 1 tablet (10 mg total) by mouth 2 (two) times daily as needed for muscle spasms.   Blood Glucose Monitoring Suppl (ACCU-CHEK GUIDE) w/Device KIT Use to check  blood sugar 3 times daily.   Blood Pressure Monitoring (BLOOD PRESSURE CUFF) MISC 1 each by Does not apply route daily. Please provide Large cuff   busPIRone  (BUSPAR ) 30 MG tablet Take 1 tablet (30 mg total) by mouth 2 (two) times daily.   Cholecalciferol  (VITAMIN D3) 10 MCG (400 UNIT) CAPS Take 400 Units by mouth daily.   ciclopirox  (PENLAC ) 8 % solution Apply topically at bedtime. Apply over nail and surrounding skin. Apply daily over previous coat. After seven (7) days, may remove with alcohol and continue cycle.   Continuous Glucose Sensor (DEXCOM G7 SENSOR) MISC Change sensor every 10 days.   cycloSPORINE  (RESTASIS ) 0.05 % ophthalmic emulsion Apply 1 drop into both eyes twice a day   DULoxetine  (CYMBALTA ) 60 MG capsule Take 1 capsule (60 mg total) by mouth 2 (two) times daily.   ezetimibe  (ZETIA ) 10 MG tablet Take 1 tablet (10 mg total) by mouth daily.   fluticasone  (FLONASE ) 50 MCG/ACT nasal spray Place 1 spray into both nostrils daily as needed for allergies or rhinitis.   furosemide  (LASIX ) 20 MG tablet Take 1 tablet (20 mg total) by mouth daily as needed (weight gain).   gabapentin  (NEURONTIN ) 300 MG capsule Take 1 capsule (300 mg total) by mouth 3 (three) times daily.   Glucagon  (BAQSIMI  ONE PACK) 3 MG/DOSE POWD  Place 1 Device into the nose as needed (Low blood sugar with impaired consciousness).   glucose blood (ACCU-CHEK GUIDE) test strip Use to check blood sugar 3 times daily   hydrOXYzine  (ATARAX ) 50 MG tablet Take 1 tablet (50 mg total) by mouth 3 (three) times daily.   Insulin  Pen Needle (PEN NEEDLES) 31G X 5 MM MISC Use to inject insulin  three times daily.   insulin  regular human CONCENTRATED (HUMULIN  R U-500 KWIKPEN) 500 UNIT/ML KwikPen Inject 85-95 Units into the skin daily with breakfast AND 75-80 Units daily before lunch AND 65-70 Units daily before supper. Take 30 minutes before meals.   lidocaine  (XYLOCAINE ) 5 % ointment Apply 1 Application topically 3 (three) times daily as  needed.   loratadine  (CLARITIN ) 10 MG tablet Take 1 tablet (10 mg total) by mouth daily as needed for allergies.   metoCLOPramide  (REGLAN ) 5 MG tablet Take 1 tablet (5 mg total) by mouth every 8 (eight) hours as needed for nausea. Dose decrease   metoprolol  tartrate (LOPRESSOR ) 25 MG tablet Take 1 tablet (25 mg total) by mouth 2 (two) times daily.   mometasone -formoterol  (DULERA) 100-5 MCG/ACT AERO Inhale 2 puffs into the lungs in the morning and at bedtime.   Multiple Vitamins-Minerals (EYE VITAMINS PO) Take 1 capsule by mouth daily.   OLANZapine -Samidorphan (LYBALVI ) 15-10 MG TABS Take 15 mg by mouth at bedtime.   omeprazole  (PRILOSEC) 40 MG capsule TAKE 1 CAPSULE BY MOUTH ONCE DAILY 30 MINUTES  BEFORE BREAKFAST   ondansetron  (ZOFRAN -ODT) 4 MG disintegrating tablet Take 1 tablet (4 mg total) by mouth every 8 (eight) hours as needed for nausea or vomiting.   QUEtiapine  (SEROQUEL ) 25 MG tablet Take 1 tablet (25 mg total) by mouth at bedtime as needed.   SUMAtriptan  (IMITREX ) 100 MG tablet TAKE 1 TABLET EARLIEST ONSET OF MIGRAINE. MAY REPEAT IN 2 HOURS IF HEADACHE PERSISTS OR RECURS. MAXIMUM 2 TABLETS IN 24 HOURS.   traZODone  (DESYREL ) 150 MG tablet Take 1 tablet (150 mg total) by mouth at bedtime as needed for sleep.   vitamin B-12 (CYANOCOBALAMIN) 50 MCG tablet Take 50 mcg by mouth daily.   [DISCONTINUED] AIMOVIG  140 MG/ML SOAJ SMARTSIG:140 Milligram(s) SUB-Q Every 4 Weeks     Allergies:   Aspirin, Trulicity  [dulaglutide ], and Oxycodone    Social History   Socioeconomic History   Marital status: Significant Other    Spouse name: Not on file   Number of children: 3   Years of education: 14   Highest education level: Not on file  Occupational History   Not on file  Tobacco Use   Smoking status: Former    Current packs/day: 0.00    Average packs/day: 0.5 packs/day for 13.0 years (6.5 ttl pk-yrs)    Types: Cigarettes    Start date: 2008    Quit date: 2021    Years since quitting: 4.7     Passive exposure: Past   Smokeless tobacco: Never  Vaping Use   Vaping status: Never Used  Substance and Sexual Activity   Alcohol use: Not Currently    Comment: occasional   Drug use: Yes    Types: Marijuana    Comment: occ   Sexual activity: Yes    Birth control/protection: Surgical  Other Topics Concern   Not on file  Social History Narrative   Patient lives at home with mother and father , one story    Patient has 3 children    Patient is single   Patient has 14 years of  education    Patient is right handed    Caffeine none   Social Drivers of Health   Financial Resource Strain: High Risk (05/26/2024)   Overall Financial Resource Strain (CARDIA)    Difficulty of Paying Living Expenses: Hard  Food Insecurity: Food Insecurity Present (08/17/2024)   Hunger Vital Sign    Worried About Running Out of Food in the Last Year: Sometimes true    Ran Out of Food in the Last Year: Sometimes true  Transportation Needs: No Transportation Needs (08/17/2024)   PRAPARE - Administrator, Civil Service (Medical): No    Lack of Transportation (Non-Medical): No  Physical Activity: Inactive (05/11/2024)   Exercise Vital Sign    Days of Exercise per Week: 0 days    Minutes of Exercise per Session: 0 min  Stress: Stress Concern Present (05/11/2024)   [employer] of Occupational Health - Occupational Stress Questionnaire    Feeling of Stress : Very much  Social Connections: Unknown (05/11/2024)   Social Connection and Isolation Panel    Frequency of Communication with Friends and Family: More than three times a week    Frequency of Social Gatherings with Friends and Family: More than three times a week    Attends Religious Services: More than 4 times per year    Active Member of [employer] or Organizations: Yes    Attends Banker Meetings: 1 to 4 times per year    Marital Status: Patient declined     Family History: The patient's family history includes Colon  cancer in her paternal grandmother; Colon cancer (age of onset: 72) in her father; Depression in her mother; Diabetes in her father and mother; Hypertension in her father and mother; Mental illness in her mother. There is no history of Esophageal cancer, Stomach cancer, Rectal cancer, or Asthma.  ROS:   Review of Systems  Constitution: Negative for decreased appetite, fever and weight gain.  HENT: Negative for congestion, ear discharge, hoarse voice and sore throat.   Eyes: Negative for discharge, redness, vision loss in right eye and visual halos.  Cardiovascular: Negative for chest pain, dyspnea on exertion, leg swelling, orthopnea and palpitations.  Respiratory: Negative for cough, hemoptysis, shortness of breath and snoring.   Endocrine: Negative for heat intolerance and polyphagia.  Hematologic/Lymphatic: Negative for bleeding problem. Does not bruise/bleed easily.  Skin: Negative for flushing, nail changes, rash and suspicious lesions.  Musculoskeletal: Negative for arthritis, joint pain, muscle cramps, myalgias, neck pain and stiffness.  Gastrointestinal: Negative for abdominal pain, bowel incontinence, diarrhea and excessive appetite.  Genitourinary: Negative for decreased libido, genital sores and incomplete emptying.  Neurological: Negative for brief paralysis, focal weakness, headaches and loss of balance.  Psychiatric/Behavioral: Negative for altered mental status, depression and suicidal ideas.  Allergic/Immunologic: Negative for HIV exposure and persistent infections.    EKGs/Labs/Other Studies Reviewed:    The following studies were reviewed today:   EKG:  The ekg ordered today demonstrates   Recent Labs: 03/23/2024: TSH 2.220 05/01/2024: Magnesium  2.1 05/24/2024: ALT 24; BUN 13; Creatinine, Ser 0.98; Hemoglobin 13.0; Platelets 430; Potassium 3.6; Sodium 138  Recent Lipid Panel    Component Value Date/Time   CHOL 165 08/24/2024 0816   CHOL 179 03/23/2024 0934   TRIG 150  (H) 08/24/2024 0816   HDL 45 (L) 08/24/2024 0816   HDL 44 03/23/2024 0934   CHOLHDL 3.7 08/24/2024 0816   VLDL 19 11/14/2014 0707   LDLCALC 95 08/24/2024 0816    Physical  Exam:    VS:  BP 122/88 (BP Location: Right Arm, Patient Position: Sitting, Cuff Size: Large)   Pulse (!) 103   Ht 5' 3 (1.6 m)   Wt 258 lb 9.6 oz (117.3 kg)   SpO2 96%   BMI 45.81 kg/m     Wt Readings from Last 3 Encounters:  09/17/24 260 lb (117.9 kg)  09/16/24 258 lb 9.6 oz (117.3 kg)  09/13/24 253 lb (114.8 kg)     GEN: Well nourished, well developed in no acute distress HEENT: Normal NECK: No JVD; No carotid bruits LYMPHATICS: No lymphadenopathy CARDIAC: S1S2 noted,RRR, no murmurs, rubs, gallops RESPIRATORY:  Clear to auscultation without rales, wheezing or rhonchi  ABDOMEN: Soft, non-tender, non-distended, +bowel sounds, no guarding. EXTREMITIES: No edema, No cyanosis, no clubbing MUSCULOSKELETAL:  No deformity  SKIN: Warm and dry NEUROLOGIC:  Alert and oriented x 3, non-focal PSYCHIATRIC:  Normal affect, good insight  ASSESSMENT:    1. SOB (shortness of breath)   2. Mixed hyperlipidemia   3. Coronary artery disease involving native coronary artery of native heart without angina pectoris    PLAN:    Coronary artery disease-no anginal symptoms.  Hyperlipidemia I did review her lipid profile which was done on August 24, 2024 LDL is 95 we need to adjust her lipid-lowering agents to help to bring her LDL less than 55.  Will continue Lipitor 40 mg daily and then add Zetia  10 mg daily to her regimen.  Blood pressure is acceptable, continue with current antihypertensive regimen.  Shortness of breath is multifactorial : decondition, obesity, she follows with pulmonary as well.  The patient is in agreement with the above plan. The patient left the office in stable condition.  The patient will follow up in   Medication Adjustments/Labs and Tests Ordered: Current medicines are reviewed at  length with the patient today.  Concerns regarding medicines are outlined above.  Orders Placed This Encounter  Procedures   Lipid panel   Lipoprotein A (LPA)   Meds ordered this encounter  Medications   ezetimibe  (ZETIA ) 10 MG tablet    Sig: Take 1 tablet (10 mg total) by mouth daily.    Dispense:  90 tablet    Refill:  3    Patient Instructions  Medication Instructions:  Your physician has recommended you make the following change in your medication:  START: Zetia  10 mg once daily  *If you need a refill on your cardiac medications before your next appointment, please call your pharmacy*  Lab Work: In 12 weeks: Lipids, Lp(a) If you have labs (blood work) drawn today and your tests are completely normal, you will receive your results only by: MyChart Message (if you have MyChart) OR A paper copy in the mail If you have any lab test that is abnormal or we need to change your treatment, we will call you to review the results.  Follow-Up: At [facility], you and your health needs are our priority.  As part of our continuing mission to provide you with exceptional heart care, our providers are all part of one team.  This team includes your primary Cardiologist (physician) and Advanced Practice Providers or APPs (Physician Assistants and Nurse Practitioners) who all work together to provide you with the care you need, when you need it.  Your next appointment:   1 year(s)  Provider:   Kohei Antonellis, DO             Adopting a Healthy Lifestyle.  Know  what a healthy weight is for you (roughly BMI <25) and aim to maintain this   Aim for 7+ servings of fruits and vegetables daily   65-80+ fluid ounces of water  or unsweet tea for healthy kidneys   Limit to max 1 drink of alcohol per day; avoid smoking/tobacco   Limit animal fats in diet for cholesterol and heart health - choose grass fed whenever available   Avoid highly processed foods, and foods high in  saturated/trans fats   Aim for low stress - take time to unwind and care for your mental health   Aim for 150 min of moderate intensity exercise weekly for heart health, and weights twice weekly for bone health   Aim for 7-9 hours of sleep daily   When it comes to diets, agreement about the perfect plan isnt easy to find, even among the experts. Experts at the [facility] [employer] developed an idea known as the Healthy Eating Plate. Just imagine a plate divided into logical, healthy portions.   The emphasis is on diet quality:   Load up on vegetables and fruits - one-half of your plate: Aim for color and variety, and remember that potatoes dont count.   Go for whole grains - one-quarter of your plate: Whole wheat, barley, wheat berries, quinoa, oats, [place] rice, and foods made with them. If you want pasta, go with whole wheat pasta.   Protein power - one-quarter of your plate: Fish, chicken, beans, and nuts are all healthy, versatile protein sources. Limit red meat.   The diet, however, does go beyond the plate, offering a few other suggestions.   Use healthy plant oils, such as olive, canola, soy, corn, sunflower and peanut. Check the labels, and avoid partially hydrogenated oil, which have unhealthy trans fats.   If youre thirsty, drink water . Coffee and tea are good in moderation, but skip sugary drinks and limit milk and dairy products to one or two daily servings.   The type of carbohydrate in the diet is more important than the amount. Some sources of carbohydrates, such as vegetables, fruits, whole grains, and beans-are healthier than others.   Finally, stay active  Signed, Dub Huntsman, DO  09/18/2024 9:02 AM    [facility]

## 2024-09-17 ENCOUNTER — Ambulatory Visit: Admitting: Neurology

## 2024-09-17 ENCOUNTER — Encounter: Payer: Self-pay | Admitting: Neurology

## 2024-09-17 ENCOUNTER — Ambulatory Visit (INDEPENDENT_AMBULATORY_CARE_PROVIDER_SITE_OTHER): Admitting: Clinical

## 2024-09-17 VITALS — BP 171/79 | HR 112 | Resp 20 | Wt 260.0 lb

## 2024-09-17 DIAGNOSIS — F333 Major depressive disorder, recurrent, severe with psychotic symptoms: Secondary | ICD-10-CM | POA: Diagnosis not present

## 2024-09-17 DIAGNOSIS — I10 Essential (primary) hypertension: Secondary | ICD-10-CM

## 2024-09-17 DIAGNOSIS — G43009 Migraine without aura, not intractable, without status migrainosus: Secondary | ICD-10-CM | POA: Diagnosis not present

## 2024-09-17 MED ORDER — AIMOVIG 140 MG/ML ~~LOC~~ SOAJ: 140.0000 mg | SUBCUTANEOUS | 11 refills | Status: AC

## 2024-09-17 NOTE — Progress Notes (Signed)
   THERAPIST PROGRESS NOTE Virtual Visit via Video Note  I connected with Whitney Benton on 09/17/2024 at  8:00 AM EDT by a video enabled telemedicine application and verified that I am speaking with the correct person using two identifiers.  Location: Patient: home Provider: office   I discussed the limitations of evaluation and management by telemedicine and the availability of in person appointments. The patient expressed understanding and agreed to proceed.   Follow Up Instructions: I discussed the assessment and treatment plan with the patient. The patient was provided an opportunity to ask questions and all were answered. The patient agreed with the plan and demonstrated an understanding of the instructions.   The patient was advised to call back or seek an in-person evaluation if the symptoms worsen or if the condition fails to improve as anticipated.   Session Time: 25 min  Participation Level: Active  Behavioral Response: CasualAlertAnxious  Type of Therapy: Individual Therapy  Treatment Goals addressed: client will engage in at least 80% of scheduled individual psychotherapy sessions  ProgressTowards Goals: Progressing  Interventions: CBT  Summary:  Whitney Benton is a 53 y.o. female who presents for the scheduled appointment oriented times five, appropriately dressed and friendly. Client denied hallucinations and delusions. Client reported some stress is going on. Client reported she has to move out of her sons place. Client reported he is giving her until November/ December. Client reported she does not want to go back to her parents home because her sister is not taking care of the house and there is a smell. Client reported she is looking for other leads. Client reported health related she has been doing well with her A1C beng down to an 8. 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  Evidence of progress towards goal:  client reported 1 positive skill  use of problem solving skills.  Suicidal/Homicidal: Nowithout intent/plan  Therapist Response:  Therapist began the appointment asking the client how she has been doing. Therapist engaged with active listening and positive emotional support. Therapist used cbt to engage and ask her about any psychosocial changes affecting her mood. Therapist used cbt to normalize her concerns. Therapist used cbt to discuss resources for housing by housing authority. Therapist used CBT ask the client to identify her progress with frequency of use with coping skills with continued practice in her daily activity.    Therapist assigned her self care.   Plan: Return again in 3 weeks.  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  Patient/Guardian was advised Release of Information must be obtained prior to any record release in order to collaborate their care with an outside provider. Patient/Guardian was advised if they have not already done so to contact the registration department to sign all necessary forms in order for us  to release information regarding their care.   Consent: Patient/Guardian gives verbal consent for treatment and assignment of benefits for services provided during this visit. Patient/Guardian expressed understanding and agreed to proceed.   Oney Tatlock Y Priyansh Pry, LCSW 09/17/2024

## 2024-09-17 NOTE — Patient Instructions (Addendum)
 Take Aimovig  140mg  injection every 28 days Sumatriptan  as directed.

## 2024-09-21 ENCOUNTER — Other Ambulatory Visit (HOSPITAL_COMMUNITY): Payer: Self-pay | Admitting: Psychiatry

## 2024-09-21 ENCOUNTER — Telehealth (HOSPITAL_COMMUNITY): Payer: Self-pay | Admitting: Psychiatry

## 2024-09-21 DIAGNOSIS — F411 Generalized anxiety disorder: Secondary | ICD-10-CM

## 2024-09-21 DIAGNOSIS — F333 Major depressive disorder, recurrent, severe with psychotic symptoms: Secondary | ICD-10-CM

## 2024-09-21 MED ORDER — DULOXETINE HCL 60 MG PO CPEP: 60.0000 mg | ORAL_CAPSULE | Freq: Two times a day (BID) | ORAL | 3 refills | Status: DC

## 2024-09-21 NOTE — Telephone Encounter (Signed)
Medication reordered and sent to preferred pharmacy.

## 2024-09-22 ENCOUNTER — Ambulatory Visit: Admitting: "Endocrinology

## 2024-09-24 ENCOUNTER — Ambulatory Visit
Admission: RE | Admit: 2024-09-24 | Discharge: 2024-09-24 | Disposition: A | Discharge status: Home / Self Care | Source: Ambulatory Visit | Attending: Internal Medicine | Admitting: Internal Medicine

## 2024-09-24 DIAGNOSIS — Z1231 Encounter for screening mammogram for malignant neoplasm of breast: Secondary | ICD-10-CM

## 2024-09-27 ENCOUNTER — Other Ambulatory Visit: Payer: Self-pay

## 2024-09-28 ENCOUNTER — Other Ambulatory Visit: Payer: Self-pay

## 2024-09-29 ENCOUNTER — Other Ambulatory Visit: Payer: Self-pay | Admitting: Internal Medicine

## 2024-09-29 ENCOUNTER — Ambulatory Visit: Payer: Self-pay | Admitting: Internal Medicine

## 2024-09-29 DIAGNOSIS — R928 Other abnormal and inconclusive findings on diagnostic imaging of breast: Secondary | ICD-10-CM

## 2024-09-30 ENCOUNTER — Other Ambulatory Visit: Payer: Self-pay | Admitting: *Deleted

## 2024-09-30 ENCOUNTER — Other Ambulatory Visit: Payer: Self-pay

## 2024-09-30 DIAGNOSIS — J45909 Unspecified asthma, uncomplicated: Secondary | ICD-10-CM

## 2024-09-30 MED FILL — Insulin Regular (Human) Soln Pen-Injector 500 Unit/ML: SUBCUTANEOUS | 25 days supply | Qty: 12 | Fill #3 | Status: AC

## 2024-09-30 NOTE — Patient Outreach (Signed)
 Complex Care Management   Visit Note  09/30/2024  Name:  Whitney Benton MRN: [id number] DOB: October 09, 1971  Situation: Referral received for Complex Care Management related to SDOH Barriers:  Housing Insecurity Food insecurity Lack of essential utilities   and Asthma and Diabetes I obtained verbal consent from Patient.  Visit completed with Patient  on the phone  Background:   Past Medical History:  Diagnosis Date   Allergy    Anxiety    Arthritis    Asthma    Depression    Diabetes mellitus without complication (HCC)    GERD (gastroesophageal reflux disease)    Glaucoma suspect    Hyperlipidemia    Trichomonas infection     Assessment:  Outreached completed with Whitney Benton.  Whitney Benton informs me that she would like assistance with [employer], food resources and utility resources.  Whitney Benton informs me that her sister and her 4 children are living with her now and she has been under a lot of pressure and stress due to the increased number of persons living her household.  She does inform me that he sister has just received her children back and she is trying to help her sister and her 4 children.   She was agreeable to BSW referral to assist with resources.  She reports that her blood sugars have been running high at times due to what she believes is an increase in stress.  She is followed by [facility] and has declined LCSW services at this time.  Discuss strategies for managing Blood Sugar fluctuations and the importance of carbohydrate counting and consuming sweets in moderation.  We also discussed aiming for 150 minutes per week of moderate-intensity activity( brisk walking, swimming or cycling.    Patient Reported Symptoms:  Cognitive Cognitive Status: Able to follow simple commands, Alert and oriented to person, place, and time, Insightful and able to interpret abstract concepts, Normal speech and language skills Cognitive/Intellectual Conditions Management [RPT]: None reported or  documented in medical history or problem list   Health Maintenance Behaviors: Annual physical exam, Sleep adequate, Healthy diet, Stress management Healing Pattern: Average Health Facilitated by: Prayer/meditation, Rest, Healthy diet  Neurological Neurological Review of Symptoms: Headaches Neurological Management Strategies: Adequate rest, Medication therapy, Routine screening Neurological Comment: Patient reports that she has occassional headaches.  She reports that she uses Imitrex  for migraines as needed.  HEENT HEENT Symptoms Reported: Other: (Patient reports dry eyes.  She reports using eye drops to assist with the dryness.) HEENT Management Strategies: Medication therapy, Routine screening HEENT Self-Management Outcome: 3 (uncertain)    Cardiovascular Cardiovascular Symptoms Reported: No symptoms reported Does patient have uncontrolled Hypertension?: No Cardiovascular Management Strategies: Adequate rest, Routine screening, Coping strategies Cardiovascular Comment: Patient reports she has no Blood pressure readings.  Patient encouraged to take BP once daily and log readings.  Respiratory Respiratory Symptoms Reported: Shortness of breath, Wheezing Other Respiratory Symptoms: Patient reports occassional shortness of breath and wheezing.  Patient reports that she uses inhalers to assist with the shortness of breath.  Patient also reports that she received her CPAP on September 15th 2025.  She reports that she has been having some trouble with the mask.  She informes me that the supply company has ordered her a new mask and she is awaiting the arrival of the new mask. Respiratory Management Strategies: Asthma action plan, Routine screening, Adequate rest, Coping strategies Respiratory Self-Management Outcome: 3 (uncertain)  Endocrine Endocrine Symptoms Reported: No symptoms reported Is patient diabetic?: Yes  Is patient checking blood sugars at home?: Yes List most recent blood sugar  readings, include date and time of day: Patient has dexcom.  Patient reports that her BS is 172 fasting today. Endocrine Comment: Patient's blood glucose levels have ranged from a high of 300 to a low of 70. We discussed strategies for managing these fluctuations, including the importance of carbohydrate counting and consuming sweets in moderation.  Gastrointestinal Gastrointestinal Symptoms Reported: No symptoms reported Gastrointestinal Management Strategies: Activity, Adequate rest, Medication therapy Gastrointestinal Self-Management Outcome: 4 (good)    Genitourinary Genitourinary Symptoms Reported: No symptoms reported Genitourinary Management Strategies: Adequate rest Genitourinary Self-Management Outcome: 4 (good)  Integumentary Integumentary Symptoms Reported: No symptoms reported Skin Management Strategies: Adequate rest, Routine screening Skin Self-Management Outcome: 4 (good)  Musculoskeletal Musculoskelatal Symptoms Reviewed: Back pain, Joint pain Additional Musculoskeletal Details: Patient reports that she severe tendonitis at the tips of her hands. She reports that she takes Neurontin .  She is planning to have surgery on her hands once blood sugars are controlled. Reports that she is taking Tylenol  Arthritis. Reports pain level a 8/10 on pain scale Musculoskeletal Management Strategies: Routine screening, Medication therapy Musculoskeletal Self-Management Outcome: 3 (uncertain) Falls in the past year?: No Number of falls in past year: 1 or less Was there an injury with Fall?: No Fall Risk Category Calculator: 0 Patient Fall Risk Level: Low Fall Risk Patient at Risk for Falls Due to: No Fall Risks Fall risk Follow up: Falls evaluation completed  Psychosocial Psychosocial Symptoms Reported: Anxiety - if selected complete GAD, Depression - if selected complete PHQ 2-9 Additional Psychological Details: Patient reports that she has anxiety due to her living situation.  She reports  that her sister has 4 children that are currently living in her home.  She reports that the stress of having her sister and her children in her home as been a lot.  She reports that her sister has just received 3 or her children back and the 4 child is coming back with her sister very soon.  She is trying to support her sister and her children, but reports the situation has been a lot on her.  She reports that she is on Cymbalta  and Buspar  to help with the Anxiety and depression.  She also is followed by [facility]. Behavioral Management Strategies: Activity, Coping strategies, Counseling, Support system, Adequate rest, Medication therapy Behavioral Health Self-Management Outcome: 3 (uncertain) Major Change/Loss/Stressor/Fears (CP): Medical condition, self Behaviors When Feeling Stressed/Fearful: Family issues Techniques to Cope with Loss/Stress/Change: Counseling, Diversional activities, Medication Quality of Family Relationships: helpful, involved, supportive Do you feel physically threatened by others?: No    09/30/2024    PHQ2-9 Depression Screening   Little interest or pleasure in doing things Nearly every day  Feeling down, depressed, or hopeless Nearly every day  PHQ-2 - Total Score 6  Trouble falling or staying asleep, or sleeping too much Nearly every day  Feeling tired or having little energy Nearly every day  Poor appetite or overeating  Nearly every day  Feeling bad about yourself - or that you are a failure or have let yourself or your family down More than half the days  Trouble concentrating on things, such as reading the newspaper or watching television Nearly every day  Moving or speaking so slowly that other people could have noticed.  Or the opposite - being so fidgety or restless that you have been moving around a lot more than usual Nearly every day  Thoughts that you would  be better off dead, or hurting yourself in some way Not at all  PHQ2-9 Total Score 23  If you checked off  any problems, how difficult have these problems made it for you to do your work, take care of things at home, or get along with other people Somewhat difficult  Depression Interventions/Treatment Currently on Treatment, Medication, Counseling    There were no vitals filed for this visit.  Medications Reviewed Today     Reviewed by Jorja Nichole LABOR, RN (Case Manager) on 09/30/24 at [phone]  Med List Status: <None>   Medication Order Taking? Sig Documenting Provider Last Dose Status Informant  Accu-Chek Softclix Lancets lancets [id number] Yes Use to check blood sugar 3 times daily [name], MD  Active   acetaminophen  (TYLENOL ) 650 MG CR tablet [id number] Yes Take 650 mg by mouth every 8 (eight) hours as needed for pain. [provider]  Active   acetaminophen  (TYLENOL ) 650 MG CR tablet [id number] Yes Take 650 mg by mouth every 8 (eight) hours as needed for pain. [provider]  Active   albuterol  (VENTOLIN  HFA) 108 (90 Base) MCG/ACT inhaler [id number] Yes Inhale 2 puffs into the lungs every 6 (six) hours as needed for wheezing or shortness of breath (Cough). [name], MD  Active   amLODipine  (NORVASC ) 10 MG tablet [id number] Yes Take 1 tablet (10 mg total) by mouth daily. [name], MD  Active   atorvastatin  (LIPITOR) 40 MG tablet [id number] Yes Take 1 tablet (40 mg total) by mouth daily. Motwani, Komal, MD  Active   baclofen  (LIORESAL ) 10 MG tablet [id number] Yes Take 1 tablet (10 mg total) by mouth 2 (two) times daily as needed for muscle spasms. Raspet, Erin K, PA-C  Active   Blood Glucose Monitoring Suppl (ACCU-CHEK GUIDE) w/Device KIT [id number] Yes Use to check blood sugar 3 times daily. [name], MD  Active   Blood Pressure Monitoring (BLOOD PRESSURE CUFF) MISC [id number] Yes 1 each by Does not apply route daily. Please provide Large cuff Wyn Jackee VEAR Mickey., NP  Active   busPIRone  (BUSPAR ) 30 MG tablet [id number] Yes Take 1 tablet (30 mg total) by  mouth 2 (two) times daily. Harl Zane BRAVO, NP  Active   Cholecalciferol  (VITAMIN D3) 10 MCG (400 UNIT) CAPS [id number] Yes Take 400 Units by mouth daily. [provider]  Active Self  ciclopirox  (PENLAC ) 8 % solution [id number] Yes Apply topically at bedtime. Apply over nail and surrounding skin. Apply daily over previous coat. After seven (7) days, may remove with alcohol and continue cycle. Lamount Ethan CROME, DPM  Active   Continuous Glucose Sensor (DEXCOM G7 SENSOR) MISC [id number] Yes Change sensor every 10 days. Motwani, Komal, MD  Active Self  cycloSPORINE  (RESTASIS ) 0.05 % ophthalmic emulsion [id number] Yes Apply 1 drop into both eyes twice a day   Active Self  DULoxetine  (CYMBALTA ) 60 MG capsule [id number] Yes Take 1 capsule (60 mg total) by mouth 2 (two) times daily. Harl Zane BRAVO, NP  Active   Erenumab -aooe (AIMOVIG ) 140 MG/ML EMMANUEL [id number] Yes Inject 140 mg into the skin every 28 (twenty-eight) days. Skeet Juliene SAUNDERS, DO  Active   ezetimibe  (ZETIA ) 10 MG tablet [id number] Yes Take 1 tablet (10 mg total) by mouth daily. Tobb, Kardie, DO  Active   fluticasone  (FLONASE ) 50 MCG/ACT nasal spray [id number] Yes Place 1 spray into both nostrils daily as needed for allergies or rhinitis. [name], MD  Active   furosemide  (  LASIX ) 20 MG tablet [id number] Yes Take 1 tablet (20 mg total) by mouth daily as needed (weight gain). Wyn Jackee VEAR Mickey., NP  Active Self  gabapentin  (NEURONTIN ) 300 MG capsule [id number] Yes Take 1 capsule (300 mg total) by mouth 3 (three) times daily. Harl Zane BRAVO, NP  Active   Glucagon  (BAQSIMI  ONE PACK) 3 MG/DOSE POWD [id number] Yes Place 1 Device into the nose as needed (Low blood sugar with impaired consciousness). Dartha Ernst, MD  Active Self           Med Note (ROBB, MELANIE A   Tue May 04, 2024  1:14 PM) Has on hand  glucose blood (ACCU-CHEK GUIDE) test strip [id number] Yes Use to check blood sugar 3 times daily [name], MD  Active    hydrOXYzine  (ATARAX ) 50 MG tablet [id number] Yes Take 1 tablet (50 mg total) by mouth 3 (three) times daily. Harl Zane BRAVO, NP  Active   Insulin  Pen Needle (PEN NEEDLES) 31G X 5 MM MISC [id number] Yes Use to inject insulin  three times daily. [name], MD  Active   insulin  regular human CONCENTRATED (HUMULIN  R U-500 KWIKPEN) 500 UNIT/ML KwikPen [id number] Yes Inject 85-95 Units into the skin daily with breakfast AND 75-80 Units daily before lunch AND 65-70 Units daily before supper. Take 30 minutes before meals. Motwani, Komal, MD  Active   lidocaine  (XYLOCAINE ) 5 % ointment [id number] Yes Apply 1 Application topically 3 (three) times daily as needed. Raspet, Erin K, PA-C  Active   loratadine  (CLARITIN ) 10 MG tablet [id number] Yes Take 1 tablet (10 mg total) by mouth daily as needed for allergies. [name], MD  Active   metoCLOPramide  (REGLAN ) 5 MG tablet [id number] Yes Take 1 tablet (5 mg total) by mouth every 8 (eight) hours as needed for nausea. Dose decrease [name], MD  Active   metoprolol  tartrate (LOPRESSOR ) 25 MG tablet [id number] Yes Take 1 tablet (25 mg total) by mouth 2 (two) times daily. Wyn Jackee VEAR Mickey., NP  Active Self  mometasone -formoterol  [facility]) 100-5 MCG/ACT TERESE [id number] Yes Inhale 2 puffs into the lungs in the morning and at bedtime. Desai, Nikita S, MD  Active   Multiple Vitamins-Minerals (EYE VITAMINS PO) [id number] Yes Take 1 capsule by mouth daily. [provider]  Active Self  OLANZapine -Samidorphan (LYBALVI ) 15-10 MG TABS [id number] Yes Take 15 mg by mouth at bedtime. Harl Zane BRAVO, NP  Active   omeprazole  (PRILOSEC) 40 MG capsule [id number] Yes TAKE 1 CAPSULE BY MOUTH ONCE DAILY 30 MINUTES  BEFORE BREAKFAST Kennedy-Smith, Colleen M, NP  Active   ondansetron  (ZOFRAN -ODT) 4 MG disintegrating tablet [id number] Yes Take 1 tablet (4 mg total) by mouth every 8 (eight) hours as needed for nausea or vomiting. [name], MD  Active  Self  QUEtiapine  (SEROQUEL ) 25 MG tablet [id number] Yes Take 1 tablet (25 mg total) by mouth at bedtime as needed. Harl Zane BRAVO, NP  Active   SUMAtriptan  (IMITREX ) 100 MG tablet [id number] Yes TAKE 1 TABLET EARLIEST ONSET OF MIGRAINE. MAY REPEAT IN 2 HOURS IF HEADACHE PERSISTS OR RECURS. MAXIMUM 2 TABLETS IN 24 HOURS. Skeet Juliene SAUNDERS, DO  Active Self  traZODone  (DESYREL ) 150 MG tablet [id number] Yes Take 1 tablet (150 mg total) by mouth at bedtime as needed for sleep. Harl Zane BRAVO, NP  Active   vitamin B-12 (CYANOCOBALAMIN) 50 MCG tablet [id number] Yes Take 50 mcg by mouth daily. [provider]  Active Self  Recommendation:   PCP Follow-up Specialty provider follow-up : [facility]-10/15/24; Pulmonary-11/04/24;  Continue Current Plan of Care Take Blood Pressure daily and record readings.   Follow Up Plan:   Telephone follow-up in 1 month: 10/24/24 @ 11am  Raschelle Wisenbaker, RN, BSN, ACM RN Care Manager [employer] [phone]

## 2024-09-30 NOTE — Patient Instructions (Addendum)
 Visit Information  Whitney Benton was given information about Medicaid Managed Care team care coordination services as a part of their [facility] Community Plan Medicaid benefit.   If you would like to schedule transportation through your [facility], please call the following number at least 2 days in advance of your appointment: [phone]   Rides for urgent appointments can also be made after hours by calling Member Services.  Call the Behavioral Health Crisis Line at [phone], at any time, 24 hours a day, 7 days a week. If you are in danger or need immediate medical attention call 911.  Please see education materials related to Asthma and diabetes provided by MyChart link.  Patient verbalizes understanding of instructions and care plan provided today and agrees to view in MyChart. Active MyChart status and patient understanding of how to access instructions and care plan via MyChart confirmed with patient.     Telephone follow up appointment with Managed Medicaid care management team member scheduled for: 11/03/24 @ 11 am  Social Work appointment-  10/21/24 @ 10 am  [name], RN, BSN, Theatre manager [employer] [phone]

## 2024-10-01 ENCOUNTER — Other Ambulatory Visit: Payer: Self-pay

## 2024-10-05 ENCOUNTER — Telehealth: Payer: Self-pay | Admitting: *Deleted

## 2024-10-05 NOTE — Progress Notes (Signed)
 Complex Care Management Note Care Guide Note  10/05/2024 Name: Whitney Benton MRN: [id number] DOB: 22-Sep-1971   Complex Care Management Outreach Attempts: An unsuccessful telephone outreach was attempted today to offer the patient information about available complex care management services.  Follow Up Plan:  Additional outreach attempts will be made to offer the patient complex care management information and services.   Encounter Outcome:  No Answer  Asencion Gell  [facility] HealthPopulation Health Care Guide  Direct Dial [phone] Fax:[phone] Website: [email or web address]

## 2024-10-08 ENCOUNTER — Ambulatory Visit
Admission: RE | Admit: 2024-10-08 | Discharge: 2024-10-08 | Disposition: A | Source: Ambulatory Visit | Attending: Internal Medicine | Admitting: Internal Medicine

## 2024-10-08 DIAGNOSIS — R928 Other abnormal and inconclusive findings on diagnostic imaging of breast: Secondary | ICD-10-CM

## 2024-10-08 DIAGNOSIS — N6012 Diffuse cystic mastopathy of left breast: Secondary | ICD-10-CM | POA: Diagnosis not present

## 2024-10-09 ENCOUNTER — Ambulatory Visit: Payer: Self-pay | Admitting: Internal Medicine

## 2024-10-13 ENCOUNTER — Telehealth: Payer: Self-pay

## 2024-10-13 NOTE — Progress Notes (Signed)
 Complex Care Management Note Care Guide Note  10/13/2024 Name: Whitney Benton MRN: [id number] DOB: 1970/12/30  [name] is a 53 y.o. year old female who is a primary care patient of [name], MD . The community resource team was consulted for assistance with Food Insecurity, Financial Difficulties related to utilities, and housing.  SDOH screenings and interventions completed:  Yes  Social Drivers of Health From This Encounter   Food Insecurity: No Food Insecurity (10/13/2024)   Hunger Vital Sign    Worried About Running Out of Food in the Last Year: Never true    Ran Out of Food in the Last Year: Never true  Housing: Low Risk  (10/13/2024)   Housing Stability Vital Sign    Unable to Pay for Housing in the Last Year: No    Number of Times Moved in the Last Year: 0    Homeless in the Last Year: No  Financial Resource Strain: High Risk (10/13/2024)   Overall Financial Resource Strain (CARDIA)    Difficulty of Paying Living Expenses: Hard  Transportation Needs: No Transportation Needs (10/13/2024)   PRAPARE - Administrator, Civil Service (Medical): No    Lack of Transportation (Non-Medical): No  Utilities: At Risk (10/13/2024)   Utilities    Threatened with loss of utilities: Yes    SDOH Interventions Today    Flowsheet Row Most Recent Value  SDOH Interventions   Food Insecurity Interventions Other (Comment)  [Emailed [employer], [employer] list and Advertising copywriter extra benefits.]  Housing Interventions Other (Comment)  [Emailed information for [employer] Counseling program.]  Utilities Interventions Other (Comment)  [Emailed information for [employer] and application, [place] [employer], [employer] emergency assistance]     Care guide performed the following interventions: Patient provided with information about care guide support team and interviewed to confirm resource  needs.  Follow Up Plan:  Client will call if she does not receive emailed resources and No further follow up planned at this time. The patient has been provided with needed resources.  Encounter Outcome:  Patient Visit Completed  Artemis Koller [facility]  [facility] Resource Care Guide Direct Dial : [phone]  Fax: [phone] Website: [email or web address]

## 2024-10-14 ENCOUNTER — Other Ambulatory Visit: Payer: Self-pay

## 2024-10-15 ENCOUNTER — Encounter (HOSPITAL_COMMUNITY): Payer: Self-pay

## 2024-10-15 ENCOUNTER — Ambulatory Visit (HOSPITAL_COMMUNITY): Admitting: Clinical

## 2024-10-19 ENCOUNTER — Ambulatory Visit (INDEPENDENT_AMBULATORY_CARE_PROVIDER_SITE_OTHER): Admitting: Clinical

## 2024-10-19 ENCOUNTER — Emergency Department (HOSPITAL_COMMUNITY): Admission: EM | Admit: 2024-10-19 | Discharge: 2024-10-19 | Disposition: A | Discharge status: Home / Self Care

## 2024-10-19 ENCOUNTER — Other Ambulatory Visit: Payer: Self-pay

## 2024-10-19 DIAGNOSIS — R1013 Epigastric pain: Secondary | ICD-10-CM | POA: Diagnosis present

## 2024-10-19 DIAGNOSIS — K2970 Gastritis, unspecified, without bleeding: Secondary | ICD-10-CM | POA: Diagnosis not present

## 2024-10-19 DIAGNOSIS — Z794 Long term (current) use of insulin: Secondary | ICD-10-CM | POA: Diagnosis not present

## 2024-10-19 DIAGNOSIS — F331 Major depressive disorder, recurrent, moderate: Secondary | ICD-10-CM

## 2024-10-19 DIAGNOSIS — R112 Nausea with vomiting, unspecified: Secondary | ICD-10-CM

## 2024-10-19 LAB — CBC WITH DIFFERENTIAL/PLATELET
Abs Immature Granulocytes: 0.02 K/uL (ref 0.00–0.07)
Basophils Absolute: 0 K/uL (ref 0.0–0.1)
Basophils Relative: 0 %
Eosinophils Absolute: 0.1 K/uL (ref 0.0–0.5)
Eosinophils Relative: 1 %
HCT: 42.3 % (ref 36.0–46.0)
Hemoglobin: 12.8 g/dL (ref 12.0–15.0)
Immature Granulocytes: 0 %
Lymphocytes Relative: 34 %
Lymphs Abs: 2.9 K/uL (ref 0.7–4.0)
MCH: 25.5 pg — ABNORMAL LOW (ref 26.0–34.0)
MCHC: 30.3 g/dL (ref 30.0–36.0)
MCV: 84.3 fL (ref 80.0–100.0)
Monocytes Absolute: 0.7 K/uL (ref 0.1–1.0)
Monocytes Relative: 8 %
Neutro Abs: 4.8 K/uL (ref 1.7–7.7)
Neutrophils Relative %: 57 %
Platelets: 526 K/uL — ABNORMAL HIGH (ref 150–400)
RBC: 5.02 MIL/uL (ref 3.87–5.11)
RDW: 15.2 % (ref 11.5–15.5)
WBC: 8.4 K/uL (ref 4.0–10.5)
nRBC: 0 % (ref 0.0–0.2)

## 2024-10-19 LAB — HEPATIC FUNCTION PANEL
ALT: 22 U/L (ref 0–44)
AST: 28 U/L (ref 15–41)
Albumin: 4.5 g/dL (ref 3.5–5.0)
Alkaline Phosphatase: 113 U/L (ref 38–126)
Bilirubin, Direct: 0.2 mg/dL (ref 0.0–0.2)
Indirect Bilirubin: 0.3 mg/dL (ref 0.3–0.9)
Total Bilirubin: 0.6 mg/dL (ref 0.0–1.2)
Total Protein: 8 g/dL (ref 6.5–8.1)

## 2024-10-19 LAB — BASIC METABOLIC PANEL WITH GFR
Anion gap: 14 (ref 5–15)
BUN: 10 mg/dL (ref 6–20)
CO2: 26 mmol/L (ref 22–32)
Calcium: 9.7 mg/dL (ref 8.9–10.3)
Chloride: 100 mmol/L (ref 98–111)
Creatinine, Ser: 0.98 mg/dL (ref 0.44–1.00)
GFR, Estimated: >60 mL/min (ref >60)
Glucose, Bld: 255 mg/dL — ABNORMAL HIGH (ref 70–99)
Potassium: 3.3 mmol/L — ABNORMAL LOW (ref 3.5–5.1)
Sodium: 140 mmol/L (ref 135–145)

## 2024-10-19 LAB — LIPASE, BLOOD: Lipase: 23 U/L (ref 11–51)

## 2024-10-19 MED ORDER — ONDANSETRON HCL 4 MG PO TABS
4.0000 mg | ORAL_TABLET | Freq: Three times a day (TID) | ORAL | 0 refills | Status: DC | PRN
Start: 2024-10-19 — End: 2024-10-23

## 2024-10-19 MED ORDER — LIDOCAINE VISCOUS HCL 2 % MT SOLN
15.0000 mL | Freq: Once | OROMUCOSAL | Status: AC
  Administered 2024-10-19: 15 mL via ORAL
  Filled 2024-10-19: qty 15

## 2024-10-19 MED ORDER — LACTATED RINGERS IV BOLUS
1000.0000 mL | Freq: Once | INTRAVENOUS | Status: AC
  Administered 2024-10-19: 1000 mL via INTRAVENOUS

## 2024-10-19 MED ORDER — SUCRALFATE 1 G PO TABS: 1.0000 g | ORAL_TABLET | Freq: Three times a day (TID) | ORAL | 0 refills | Status: DC

## 2024-10-19 MED ORDER — ALUM & MAG HYDROXIDE-SIMETH 200-200-20 MG/5ML PO SUSP
30.0000 mL | Freq: Once | ORAL | Status: AC
  Administered 2024-10-19: 30 mL via ORAL
  Filled 2024-10-19: qty 30

## 2024-10-19 MED ORDER — ONDANSETRON HCL 4 MG/2ML IJ SOLN
4.0000 mg | Freq: Once | INTRAMUSCULAR | Status: AC
  Administered 2024-10-19: 4 mg via INTRAVENOUS
  Filled 2024-10-19: qty 2

## 2024-10-19 MED ORDER — PANTOPRAZOLE SODIUM 20 MG PO TBEC: 40.0000 mg | DELAYED_RELEASE_TABLET | Freq: Every day | ORAL | 0 refills | Status: DC

## 2024-10-19 NOTE — Progress Notes (Signed)
   THERAPIST PROGRESS NOTE Virtual Visit via Video Note  I connected with Whitney Benton on 10/19/2024 at  8:00 AM EDT by a video enabled telemedicine application and verified that I am speaking with the correct person using two identifiers.  Location: Patient: parked car Provider: office   I discussed the limitations of evaluation and management by telemedicine and the availability of in person appointments. The patient expressed understanding and agreed to proceed.   Follow Up Instructions: I discussed the assessment and treatment plan with the patient. The patient was provided an opportunity to ask questions and all were answered. The patient agreed with the plan and demonstrated an understanding of the instructions.   The patient was advised to call back or seek an in-person evaluation if the symptoms worsen or if the condition fails to improve as anticipated.    Session Time: 16 min  Participation Level: Active  Behavioral Response: CasualAlertAnxious  Type of Therapy: Individual Therapy  Treatment Goals addressed: client will engage in at least 80% of scheduled individual psychotherapy sessions  ProgressTowards Goals: Progressing  Interventions: CBT  Summary:  Whitney Benton is a 53 y.o. female who presents for the scheduled appointment oriented times five, appropriately dressed and cooperative. Client denied hallucinations and delusions. Client reported she has been under a lot of stress. Client reported she and has to move her things out of the apartment she has been staying at with her older son. Client reported her younger son is upset because she will not watch his kids when they want her to. Client reported this morning they woke her up talking about their schedule when she told the she has not been feeling good since last week. Client reported she believes the stress is causing her gastroparesis symptoms to act up. Evidence of progress towards goal:  client reported 1  positive of going to take time to go see a doctor today and then rest.   Suicidal/Homicidal: Nowithout intent/plan  Therapist Response:  Therapist began the appointment asking how she has been doing. Therapist engaged with active listening and positive emotional support. Therapist used cbt to engage ask her about stressors affecting her mood. Therapist used cbt to teach her about taking time to slow down and communicate her boundaries. Therapist used CBT ask the client to identify her progress with frequency of use with coping skills with continued practice in her daily activity.    Therapist assigned homework for her to reach out to her doctor.   Plan: Return again in 2 weeks.  Diagnosis: mdd, recurrent episode, moderate  Collaboration of Care: Patient refused AEB none requested by the client.  Patient/Guardian was advised Release of Information must be obtained prior to any record release in order to collaborate their care with an outside provider. Patient/Guardian was advised if they have not already done so to contact the registration department to sign all necessary forms in order for us  to release information regarding their care.   Consent: Patient/Guardian gives verbal consent for treatment and assignment of benefits for services provided during this visit. Patient/Guardian expressed understanding and agreed to proceed.   Lulia Schriner Y Reshaun Briseno, LCSW 10/19/2024

## 2024-10-19 NOTE — ED Notes (Signed)
 Blue, light green, lavender, & red tubes sent to lab for hold.

## 2024-10-19 NOTE — Discharge Instructions (Signed)
 Take your Protonix  and Carafate  as prescribed.  You can use your Zofran  as needed for nausea and vomiting.  Drink lots of fluids and keep a bland diet for the next 24 hours.  Your lab workup was unremarkable.  You likely have gastritis.  Follow-up with your primary care doctor in 1 week

## 2024-10-19 NOTE — ED Notes (Signed)
 Pt advises being unable to void for UA at this time, pt made aware need of urine.

## 2024-10-19 NOTE — ED Provider Notes (Signed)
 [place] [facility] AT [facility] Provider Note   CSN: [id number] Arrival date & time: 10/19/24  9176     Patient presents with: Abdominal Pain   Whitney Benton is a 53 y.o. female.   51 female presents for evaluation of abdominal pain and nausea.  She states it started today.  States this usually happens when her sugar is high.  She states the pain is right in the epigastric region nonradiating.  Denies any vomiting or diarrhea.   Abdominal Pain Associated symptoms: nausea   Associated symptoms: no chest pain, no chills, no cough, no dysuria, no fever, no hematuria, no shortness of breath, no sore throat and no vomiting        Prior to Admission medications   Medication Sig Start Date End Date Taking? Authorizing Provider  ondansetron  (ZOFRAN ) 4 MG tablet Take 1 tablet (4 mg total) by mouth every 8 (eight) hours as needed for up to 4 days. 10/19/24 10/23/24 Yes Ladoris Lythgoe L, DO  pantoprazole  (PROTONIX ) 20 MG tablet Take 2 tablets (40 mg total) by mouth daily. 10/19/24  Yes Sharley Keeler L, DO  sucralfate  (CARAFATE ) 1 g tablet Take 1 tablet (1 g total) by mouth 4 (four) times daily -  with meals and at bedtime for 7 days. 10/19/24 10/26/24 Yes Xachary Hambly L, DO  Accu-Chek Softclix Lancets lancets Use to check blood sugar 3 times daily 08/11/23   [name], MD  acetaminophen  (TYLENOL ) 650 MG CR tablet Take 650 mg by mouth every 8 (eight) hours as needed for pain.    [provider]  acetaminophen  (TYLENOL ) 650 MG CR tablet Take 650 mg by mouth every 8 (eight) hours as needed for pain.    [provider]  albuterol  (VENTOLIN  HFA) 108 (90 Base) MCG/ACT inhaler Inhale 2 puffs into the lungs every 6 (six) hours as needed for wheezing or shortness of breath (Cough). 05/11/24   [name], MD  amLODipine  (NORVASC ) 10 MG tablet Take 1 tablet (10 mg total) by mouth daily. 05/20/24   [name], MD  atorvastatin   (LIPITOR) 40 MG tablet Take 1 tablet (40 mg total) by mouth daily. 05/11/24   Motwani, Komal, MD  baclofen  (LIORESAL ) 10 MG tablet Take 1 tablet (10 mg total) by mouth 2 (two) times daily as needed for muscle spasms. 08/29/24   Raspet, Erin K, PA-C  Blood Glucose Monitoring Suppl (ACCU-CHEK GUIDE) w/Device KIT Use to check blood sugar 3 times daily. 08/11/23   [name], MD  Blood Pressure Monitoring (BLOOD PRESSURE CUFF) MISC 1 each by Does not apply route daily. Please provide Large cuff 03/23/24   Wyn Jackee VEAR Mickey., NP  busPIRone  (BUSPAR ) 30 MG tablet Take 1 tablet (30 mg total) by mouth 2 (two) times daily. 09/15/24   Harl Zane BRAVO, NP  Cholecalciferol  (VITAMIN D3) 10 MCG (400 UNIT) CAPS Take 400 Units by mouth daily.    [provider]  ciclopirox  (PENLAC ) 8 % solution Apply topically at bedtime. Apply over nail and surrounding skin. Apply daily over previous coat. After seven (7) days, may remove with alcohol and continue cycle. 07/19/24   Lamount Ethan CROME, DPM  Continuous Glucose Sensor (DEXCOM G7 SENSOR) MISC Change sensor every 10 days. 03/03/24   Motwani, Komal, MD  cycloSPORINE  (RESTASIS ) 0.05 % ophthalmic emulsion Apply 1 drop into both eyes twice a day 09/19/21     DULoxetine  (CYMBALTA ) 60 MG capsule Take 1 capsule (60 mg total)  by mouth 2 (two) times daily. 09/21/24   Harl Zane BRAVO, NP  Erenumab -aooe (AIMOVIG ) 140 MG/ML SOAJ Inject 140 mg into the skin every 28 (twenty-eight) days. 09/17/24   Skeet Juliene SAUNDERS, DO  ezetimibe  (ZETIA ) 10 MG tablet Take 1 tablet (10 mg total) by mouth daily. 09/16/24 12/15/24  Tobb, Kardie, DO  fluticasone  (FLONASE ) 50 MCG/ACT nasal spray Place 1 spray into both nostrils daily as needed for allergies or rhinitis. 05/11/24   [name], MD  furosemide  (LASIX ) 20 MG tablet Take 1 tablet (20 mg total) by mouth daily as needed (weight gain). 03/23/24   Wyn Jackee VEAR Mickey., NP  gabapentin  (NEURONTIN ) 300 MG capsule Take 1 capsule (300 mg total) by  mouth 3 (three) times daily. 09/15/24   Harl Zane BRAVO, NP  Glucagon  (BAQSIMI  ONE PACK) 3 MG/DOSE POWD Place 1 Device into the nose as needed (Low blood sugar with impaired consciousness). 11/24/23   Motwani, Komal, MD  glucose blood (ACCU-CHEK GUIDE) test strip Use to check blood sugar 3 times daily 08/11/23   [name], MD  hydrOXYzine  (ATARAX ) 50 MG tablet Take 1 tablet (50 mg total) by mouth 3 (three) times daily. 09/15/24   Harl Zane BRAVO, NP  Insulin  Pen Needle (PEN NEEDLES) 31G X 5 MM MISC Use to inject insulin  three times daily. 05/18/24   [name], MD  insulin  regular human CONCENTRATED (HUMULIN  R U-500 KWIKPEN) 500 UNIT/ML KwikPen Inject 85-95 Units into the skin daily with breakfast AND 75-80 Units daily before lunch AND 65-70 Units daily before supper. Take 30 minutes before meals. 07/15/24   Motwani, Komal, MD  lidocaine  (XYLOCAINE ) 5 % ointment Apply 1 Application topically 3 (three) times daily as needed. 08/29/24   Raspet, Erin K, PA-C  loratadine  (CLARITIN ) 10 MG tablet Take 1 tablet (10 mg total) by mouth daily as needed for allergies. 05/11/24   [name], MD  metoCLOPramide  (REGLAN ) 5 MG tablet Take 1 tablet (5 mg total) by mouth every 8 (eight) hours as needed for nausea. Dose decrease 05/11/24   [name], MD  metoprolol  tartrate (LOPRESSOR ) 25 MG tablet Take 1 tablet (25 mg total) by mouth 2 (two) times daily. 03/23/24   Wyn Jackee VEAR Mickey., NP  mometasone -formoterol  (DULERA) 100-5 MCG/ACT AERO Inhale 2 puffs into the lungs in the morning and at bedtime. 05/24/24   Desai, Nikita S, MD  Multiple Vitamins-Minerals (EYE VITAMINS PO) Take 1 capsule by mouth daily.    [provider]  OLANZapine -Samidorphan (LYBALVI ) 15-10 MG TABS Take 15 mg by mouth at bedtime. 09/15/24   Harl Zane BRAVO, NP  omeprazole  (PRILOSEC) 40 MG capsule TAKE 1 CAPSULE BY MOUTH ONCE DAILY 30 MINUTES  BEFORE BREAKFAST 07/12/24   Kennedy-Smith, Colleen M, NP   ondansetron  (ZOFRAN -ODT) 4 MG disintegrating tablet Take 1 tablet (4 mg total) by mouth every 8 (eight) hours as needed for nausea or vomiting. 04/29/24   [name], MD  QUEtiapine  (SEROQUEL ) 25 MG tablet Take 1 tablet (25 mg total) by mouth at bedtime as needed. 09/15/24   Harl Zane E, NP  SUMAtriptan  (IMITREX ) 100 MG tablet TAKE 1 TABLET EARLIEST ONSET OF MIGRAINE. MAY REPEAT IN 2 HOURS IF HEADACHE PERSISTS OR RECURS. MAXIMUM 2 TABLETS IN 24 HOURS. 03/11/24 03/11/25  Skeet Juliene SAUNDERS, DO  traZODone  (DESYREL ) 150 MG tablet Take 1 tablet (150 mg total) by mouth at bedtime as needed for sleep. 09/15/24   Harl Zane BRAVO, NP  vitamin B-12 (  CYANOCOBALAMIN) 50 MCG tablet Take 50 mcg by mouth daily.    [provider]    Allergies: Aspirin, Trulicity  [dulaglutide ], and Oxycodone     Review of Systems  Constitutional:  Negative for chills and fever.  HENT:  Negative for ear pain and sore throat.   Eyes:  Negative for pain and visual disturbance.  Respiratory:  Negative for cough and shortness of breath.   Cardiovascular:  Negative for chest pain and palpitations.  Gastrointestinal:  Positive for abdominal pain and nausea. Negative for vomiting.  Genitourinary:  Negative for dysuria and hematuria.  Musculoskeletal:  Negative for arthralgias and back pain.  Skin:  Negative for color change and rash.  Neurological:  Negative for seizures and syncope.  All other systems reviewed and are negative.   Updated Vital Signs BP (!) 129/91 (BP Location: Right Arm)   Pulse 89   Temp 98.8 F (37.1 C) (Oral)   Resp 18   SpO2 94%   Physical Exam Vitals and nursing note reviewed.  Constitutional:      General: She is not in acute distress.    Appearance: She is well-developed. She is obese. She is not ill-appearing.  HENT:     Head: Normocephalic and atraumatic.  Eyes:     Conjunctiva/sclera: Conjunctivae normal.  Cardiovascular:     Rate and Rhythm: Normal rate and regular  rhythm.     Heart sounds: No murmur heard. Pulmonary:     Effort: Pulmonary effort is normal. No respiratory distress.     Breath sounds: Normal breath sounds.  Abdominal:     Palpations: Abdomen is soft.     Tenderness: There is no abdominal tenderness.  Musculoskeletal:        General: No swelling.     Cervical back: Neck supple.  Skin:    General: Skin is warm and dry.     Capillary Refill: Capillary refill takes less than 2 seconds.  Neurological:     Mental Status: She is alert.  Psychiatric:        Mood and Affect: Mood normal.     (all labs ordered are listed, but only abnormal results are displayed) Labs Reviewed  BASIC METABOLIC PANEL WITH GFR - Abnormal; Notable for the following components:      Result Value   Potassium 3.3 (*)    Glucose, Bld 255 (*)    All other components within normal limits  CBC WITH DIFFERENTIAL/PLATELET - Abnormal; Notable for the following components:   MCH 25.5 (*)    Platelets 526 (*)    All other components within normal limits  HEPATIC FUNCTION PANEL  LIPASE, BLOOD    EKG: None  Radiology: No results found.   Procedures   Medications Ordered in the ED  lactated ringers  bolus 1,000 mL (0 mLs Intravenous Stopped 10/19/24 1107)  ondansetron  (ZOFRAN ) injection 4 mg (4 mg Intravenous Given 10/19/24 1018)  alum & mag hydroxide-simeth (MAALOX/MYLANTA) 200-200-20 MG/5ML suspension 30 mL (30 mLs Oral Given 10/19/24 1018)    And  lidocaine  (XYLOCAINE ) 2 % viscous mouth solution 15 mL (15 mLs Oral Given 10/19/24 1017)                                    Medical Decision Making Cardiac monitor interpretation: Sinus rhythm, no ectopy  Patient here for nausea and epigastric pain.  Lab workup was negative.  Given IV fluids Zofran  and GI cocktail feeling  much better.  Able to tolerate p.o.  Will give prescription for Carafate  and Protonix .  Also gave her prescription for Zofran  to use as needed.  Vies close follow-up with her primary  care doctor in 1 week and otherwise return to the ER for new or worsening symptoms.  She feels comfortable to plan to be discharged home.  Problems Addressed: Gastritis without bleeding, unspecified chronicity, unspecified gastritis type: acute illness or injury Nausea and vomiting, unspecified vomiting type: acute illness or injury  Amount and/or Complexity of Data Reviewed External Data Reviewed: notes.    Details: Prior ED records reviewed and patient with a history of gastroparesis Labs: ordered. Decision-making details documented in ED Course.    Details: Ordered and reviewed by me and unremarkable  Risk OTC drugs. Prescription drug management. Drug therapy requiring intensive monitoring for toxicity.     Final diagnoses:  Gastritis without bleeding, unspecified chronicity, unspecified gastritis type  Nausea and vomiting, unspecified vomiting type    ED Discharge Orders          Ordered    sucralfate  (CARAFATE ) 1 g tablet  3 times daily with meals & bedtime        10/19/24 1105    pantoprazole  (PROTONIX ) 20 MG tablet  Daily        10/19/24 1105    ondansetron  (ZOFRAN ) 4 MG tablet  Every 8 hours PRN        10/19/24 1105               Jamicia Haaland L, DO 10/19/24 1122

## 2024-10-19 NOTE — ED Triage Notes (Signed)
 Pt reports epigastric and mid abdominal pain with nausea that started last week. Denies fevers.

## 2024-10-20 ENCOUNTER — Encounter (HOSPITAL_COMMUNITY): Payer: Self-pay | Admitting: Emergency Medicine

## 2024-10-20 ENCOUNTER — Emergency Department (HOSPITAL_COMMUNITY)

## 2024-10-20 ENCOUNTER — Other Ambulatory Visit: Payer: Self-pay

## 2024-10-20 ENCOUNTER — Observation Stay (HOSPITAL_COMMUNITY)
Admission: EM | Admit: 2024-10-20 | Discharge: 2024-10-21 | Disposition: A | Discharge status: Home / Self Care | Attending: Internal Medicine | Admitting: Internal Medicine

## 2024-10-20 DIAGNOSIS — E782 Mixed hyperlipidemia: Secondary | ICD-10-CM

## 2024-10-20 DIAGNOSIS — F329 Major depressive disorder, single episode, unspecified: Secondary | ICD-10-CM | POA: Insufficient documentation

## 2024-10-20 DIAGNOSIS — F419 Anxiety disorder, unspecified: Secondary | ICD-10-CM | POA: Diagnosis not present

## 2024-10-20 DIAGNOSIS — K3184 Gastroparesis: Secondary | ICD-10-CM | POA: Diagnosis not present

## 2024-10-20 DIAGNOSIS — K2970 Gastritis, unspecified, without bleeding: Secondary | ICD-10-CM | POA: Insufficient documentation

## 2024-10-20 DIAGNOSIS — E1110 Type 2 diabetes mellitus with ketoacidosis without coma: Secondary | ICD-10-CM | POA: Insufficient documentation

## 2024-10-20 DIAGNOSIS — J45909 Unspecified asthma, uncomplicated: Secondary | ICD-10-CM | POA: Diagnosis not present

## 2024-10-20 DIAGNOSIS — R739 Hyperglycemia, unspecified: Secondary | ICD-10-CM | POA: Diagnosis present

## 2024-10-20 DIAGNOSIS — Z87891 Personal history of nicotine dependence: Secondary | ICD-10-CM | POA: Diagnosis not present

## 2024-10-20 DIAGNOSIS — Z794 Long term (current) use of insulin: Secondary | ICD-10-CM | POA: Diagnosis not present

## 2024-10-20 DIAGNOSIS — F32A Depression, unspecified: Secondary | ICD-10-CM | POA: Diagnosis present

## 2024-10-20 DIAGNOSIS — Z7901 Long term (current) use of anticoagulants: Secondary | ICD-10-CM | POA: Insufficient documentation

## 2024-10-20 DIAGNOSIS — F1290 Cannabis use, unspecified, uncomplicated: Secondary | ICD-10-CM | POA: Diagnosis not present

## 2024-10-20 DIAGNOSIS — K760 Fatty (change of) liver, not elsewhere classified: Secondary | ICD-10-CM | POA: Diagnosis not present

## 2024-10-20 DIAGNOSIS — G4700 Insomnia, unspecified: Secondary | ICD-10-CM | POA: Insufficient documentation

## 2024-10-20 DIAGNOSIS — I10 Essential (primary) hypertension: Secondary | ICD-10-CM | POA: Diagnosis not present

## 2024-10-20 DIAGNOSIS — E1143 Type 2 diabetes mellitus with diabetic autonomic (poly)neuropathy: Secondary | ICD-10-CM | POA: Insufficient documentation

## 2024-10-20 DIAGNOSIS — E785 Hyperlipidemia, unspecified: Secondary | ICD-10-CM | POA: Diagnosis not present

## 2024-10-20 DIAGNOSIS — R112 Nausea with vomiting, unspecified: Secondary | ICD-10-CM | POA: Diagnosis not present

## 2024-10-20 DIAGNOSIS — R109 Unspecified abdominal pain: Secondary | ICD-10-CM | POA: Diagnosis not present

## 2024-10-20 DIAGNOSIS — K219 Gastro-esophageal reflux disease without esophagitis: Secondary | ICD-10-CM | POA: Diagnosis not present

## 2024-10-20 DIAGNOSIS — Z90710 Acquired absence of both cervix and uterus: Secondary | ICD-10-CM | POA: Diagnosis not present

## 2024-10-20 DIAGNOSIS — E1165 Type 2 diabetes mellitus with hyperglycemia: Secondary | ICD-10-CM | POA: Diagnosis not present

## 2024-10-20 DIAGNOSIS — Z79899 Other long term (current) drug therapy: Secondary | ICD-10-CM | POA: Insufficient documentation

## 2024-10-20 LAB — URINALYSIS, ROUTINE W REFLEX MICROSCOPIC
Bacteria, UA: NONE SEEN
Bilirubin Urine: NEGATIVE
Glucose, UA: >=500 mg/dL — AB
Hgb urine dipstick: NEGATIVE
Ketones, ur: 80 mg/dL — AB
Leukocytes,Ua: NEGATIVE
Nitrite: NEGATIVE
Protein, ur: NEGATIVE mg/dL
Specific Gravity, Urine: 1.027 (ref 1.005–1.030)
pH: 6 (ref 5.0–8.0)

## 2024-10-20 LAB — COMPREHENSIVE METABOLIC PANEL WITH GFR
ALT: 24 U/L (ref 0–44)
AST: 29 U/L (ref 15–41)
Albumin: 4.5 g/dL (ref 3.5–5.0)
Alkaline Phosphatase: 127 U/L — ABNORMAL HIGH (ref 38–126)
Anion gap: 18 — ABNORMAL HIGH (ref 5–15)
BUN: 12 mg/dL (ref 6–20)
CO2: 22 mmol/L (ref 22–32)
Calcium: 9.7 mg/dL (ref 8.9–10.3)
Chloride: 96 mmol/L — ABNORMAL LOW (ref 98–111)
Creatinine, Ser: 1.04 mg/dL — ABNORMAL HIGH (ref 0.44–1.00)
GFR, Estimated: >60 mL/min (ref >60)
Glucose, Bld: 673 mg/dL (ref 70–99)
Potassium: 4.6 mmol/L (ref 3.5–5.1)
Sodium: 137 mmol/L (ref 135–145)
Total Bilirubin: 0.7 mg/dL (ref 0.0–1.2)
Total Protein: 8.3 g/dL — ABNORMAL HIGH (ref 6.5–8.1)

## 2024-10-20 LAB — BASIC METABOLIC PANEL WITH GFR
Anion gap: 16 — ABNORMAL HIGH (ref 5–15)
BUN: 12 mg/dL (ref 6–20)
CO2: 23 mmol/L (ref 22–32)
Calcium: 10.2 mg/dL (ref 8.9–10.3)
Chloride: 103 mmol/L (ref 98–111)
Creatinine, Ser: 0.97 mg/dL (ref 0.44–1.00)
GFR, Estimated: >60 mL/min (ref >60)
Glucose, Bld: 344 mg/dL — ABNORMAL HIGH (ref 70–99)
Potassium: 4.2 mmol/L (ref 3.5–5.1)
Sodium: 141 mmol/L (ref 135–145)

## 2024-10-20 LAB — BETA-HYDROXYBUTYRIC ACID
Beta-Hydroxybutyric Acid: 2.72 mmol/L — ABNORMAL HIGH (ref 0.05–0.27)
Beta-Hydroxybutyric Acid: 1.84 mmol/L — ABNORMAL HIGH (ref 0.05–0.27)

## 2024-10-20 LAB — BLOOD GAS, VENOUS
Acid-base deficit: 1.8 mmol/L (ref 0.0–2.0)
Bicarbonate: 24.7 mmol/L (ref 20.0–28.0)
O2 Saturation: 57.2 %
Patient temperature: 37
pCO2, Ven: 48 mmHg (ref 44–60)
pH, Ven: 7.32 (ref 7.25–7.43)
pO2, Ven: 35 mmHg (ref 32–45)

## 2024-10-20 LAB — CBC
HCT: 41.3 % (ref 36.0–46.0)
Hemoglobin: 12.5 g/dL (ref 12.0–15.0)
MCH: 25.9 pg — ABNORMAL LOW (ref 26.0–34.0)
MCHC: 30.3 g/dL (ref 30.0–36.0)
MCV: 85.5 fL (ref 80.0–100.0)
Platelets: 492 K/uL — ABNORMAL HIGH (ref 150–400)
RBC: 4.83 MIL/uL (ref 3.87–5.11)
RDW: 15 % (ref 11.5–15.5)
WBC: 10 K/uL (ref 4.0–10.5)
nRBC: 0 % (ref 0.0–0.2)

## 2024-10-20 LAB — GLUCOSE, CAPILLARY
Glucose-Capillary: 146 mg/dL — ABNORMAL HIGH (ref 70–99)
Glucose-Capillary: 224 mg/dL — ABNORMAL HIGH (ref 70–99)
Glucose-Capillary: 351 mg/dL — ABNORMAL HIGH (ref 70–99)

## 2024-10-20 LAB — MRSA NEXT GEN BY PCR, NASAL: MRSA by PCR Next Gen: NOT DETECTED

## 2024-10-20 LAB — CBG MONITORING, ED
Glucose-Capillary: 461 mg/dL — ABNORMAL HIGH (ref 70–99)
Glucose-Capillary: 398 mg/dL — ABNORMAL HIGH (ref 70–99)

## 2024-10-20 LAB — LIPASE, BLOOD: Lipase: 28 U/L (ref 11–51)

## 2024-10-20 MED ORDER — HYDROXYZINE HCL 25 MG PO TABS
50.0000 mg | ORAL_TABLET | Freq: Three times a day (TID) | ORAL | Status: DC
  Administered 2024-10-21 (×2): 50 mg via ORAL
  Filled 2024-10-20 (×2): qty 2

## 2024-10-20 MED ORDER — METOCLOPRAMIDE HCL 5 MG PO TABS: 5.0000 mg | ORAL_TABLET | Freq: Three times a day (TID) | ORAL | Status: DC | PRN

## 2024-10-20 MED ORDER — ATORVASTATIN CALCIUM 40 MG PO TABS
40.0000 mg | ORAL_TABLET | Freq: Every day | ORAL | Status: DC
  Administered 2024-10-21: 40 mg via ORAL
  Filled 2024-10-20: qty 1

## 2024-10-20 MED ORDER — IOHEXOL 300 MG/ML  SOLN
100.0000 mL | Freq: Once | INTRAMUSCULAR | Status: AC | PRN
  Administered 2024-10-20: 100 mL via INTRAVENOUS

## 2024-10-20 MED ORDER — ONDANSETRON HCL 4 MG PO TABS: 4.0000 mg | ORAL_TABLET | Freq: Four times a day (QID) | ORAL | Status: DC | PRN

## 2024-10-20 MED ORDER — ENOXAPARIN SODIUM 40 MG/0.4ML IJ SOSY
40.0000 mg | PREFILLED_SYRINGE | INTRAMUSCULAR | Status: DC
Start: 2024-10-20 — End: 2024-10-20

## 2024-10-20 MED ORDER — ORAL CARE MOUTH RINSE: 15.0000 mL | OROMUCOSAL | Status: DC | PRN

## 2024-10-20 MED ORDER — CHOLECALCIFEROL 10 MCG (400 UNIT) PO TABS
400.0000 [IU] | ORAL_TABLET | Freq: Every day | ORAL | Status: DC
  Administered 2024-10-21: 400 [IU] via ORAL
  Filled 2024-10-20: qty 1

## 2024-10-20 MED ORDER — AMLODIPINE BESYLATE 10 MG PO TABS
10.0000 mg | ORAL_TABLET | Freq: Every day | ORAL | Status: DC
  Administered 2024-10-21: 10 mg via ORAL
  Filled 2024-10-20: qty 1

## 2024-10-20 MED ORDER — CHLORHEXIDINE GLUCONATE CLOTH 2 % EX PADS
6.0000 | MEDICATED_PAD | Freq: Every day | CUTANEOUS | Status: DC
  Administered 2024-10-21: 6 via TOPICAL

## 2024-10-20 MED ORDER — LIDOCAINE VISCOUS HCL 2 % MT SOLN
15.0000 mL | Freq: Once | OROMUCOSAL | Status: AC
  Administered 2024-10-20: 15 mL via ORAL
  Filled 2024-10-20: qty 15

## 2024-10-20 MED ORDER — FAMOTIDINE 20 MG PO TABS
20.0000 mg | ORAL_TABLET | Freq: Once | ORAL | Status: AC
  Administered 2024-10-20: 20 mg via ORAL
  Filled 2024-10-20: qty 1

## 2024-10-20 MED ORDER — FENTANYL CITRATE (PF) 50 MCG/ML IJ SOSY
25.0000 ug | PREFILLED_SYRINGE | Freq: Once | INTRAMUSCULAR | Status: AC
  Administered 2024-10-20: 25 ug via INTRAVENOUS
  Filled 2024-10-20: qty 1

## 2024-10-20 MED ORDER — VITAMIN B-12 100 MCG PO TABS
50.0000 ug | ORAL_TABLET | Freq: Every day | ORAL | Status: DC
  Administered 2024-10-21: 50 ug via ORAL
  Filled 2024-10-20: qty 1

## 2024-10-20 MED ORDER — EZETIMIBE 10 MG PO TABS
10.0000 mg | ORAL_TABLET | Freq: Every day | ORAL | Status: DC
  Administered 2024-10-21: 10 mg via ORAL

## 2024-10-20 MED ORDER — ACETAMINOPHEN 650 MG RE SUPP: 650.0000 mg | Freq: Four times a day (QID) | RECTAL | Status: DC | PRN

## 2024-10-20 MED ORDER — DULOXETINE HCL 30 MG PO CPEP
120.0000 mg | ORAL_CAPSULE | Freq: Every day | ORAL | Status: DC
  Administered 2024-10-20 – 2024-10-21 (×2): 120 mg via ORAL
  Filled 2024-10-20 (×2): qty 4

## 2024-10-20 MED ORDER — BUSPIRONE HCL 10 MG PO TABS
30.0000 mg | ORAL_TABLET | Freq: Three times a day (TID) | ORAL | Status: DC
  Administered 2024-10-20 – 2024-10-21 (×2): 30 mg via ORAL
  Filled 2024-10-20 (×2): qty 3

## 2024-10-20 MED ORDER — DEXTROSE 50 % IV SOLN: 0.0000 mL | INTRAVENOUS | Status: DC | PRN

## 2024-10-20 MED ORDER — OLANZAPINE-SAMIDORPHAN 15-10 MG PO TABS: 1.0000 | ORAL_TABLET | Freq: Every day | ORAL | Status: DC

## 2024-10-20 MED ORDER — CHLORHEXIDINE GLUCONATE CLOTH 2 % EX PADS: 6.0000 | MEDICATED_PAD | Freq: Every day | CUTANEOUS | Status: DC

## 2024-10-20 MED ORDER — SUCRALFATE 1 G PO TABS
1.0000 g | ORAL_TABLET | Freq: Three times a day (TID) | ORAL | Status: DC
  Administered 2024-10-20 – 2024-10-21 (×3): 1 g via ORAL
  Filled 2024-10-20 (×3): qty 1

## 2024-10-20 MED ORDER — LACTATED RINGERS IV BOLUS
2000.0000 mL | Freq: Once | INTRAVENOUS | Status: AC
  Administered 2024-10-20: 2000 mL via INTRAVENOUS

## 2024-10-20 MED ORDER — INSULIN ASPART 100 UNIT/ML IJ SOLN
10.0000 [IU] | Freq: Once | INTRAMUSCULAR | Status: AC
  Administered 2024-10-20: 10 [IU] via SUBCUTANEOUS
  Filled 2024-10-20: qty 0.1

## 2024-10-20 MED ORDER — SENNOSIDES-DOCUSATE SODIUM 8.6-50 MG PO TABS: 1.0000 | ORAL_TABLET | Freq: Every evening | ORAL | Status: DC | PRN

## 2024-10-20 MED ORDER — ENOXAPARIN SODIUM 60 MG/0.6ML IJ SOSY
60.0000 mg | PREFILLED_SYRINGE | INTRAMUSCULAR | Status: DC
  Administered 2024-10-20: 60 mg via SUBCUTANEOUS
  Filled 2024-10-20: qty 0.6

## 2024-10-20 MED ORDER — BISACODYL 5 MG PO TBEC: 5.0000 mg | DELAYED_RELEASE_TABLET | Freq: Every day | ORAL | Status: DC | PRN

## 2024-10-20 MED ORDER — GABAPENTIN 300 MG PO CAPS
300.0000 mg | ORAL_CAPSULE | Freq: Three times a day (TID) | ORAL | Status: DC
  Administered 2024-10-20 – 2024-10-21 (×2): 300 mg via ORAL
  Filled 2024-10-20 (×2): qty 1

## 2024-10-20 MED ORDER — DEXTROSE IN LACTATED RINGERS 5 % IV SOLN: INTRAVENOUS | Status: DC

## 2024-10-20 MED ORDER — PANTOPRAZOLE SODIUM 40 MG PO TBEC
40.0000 mg | DELAYED_RELEASE_TABLET | Freq: Every day | ORAL | Status: DC
  Administered 2024-10-21: 40 mg via ORAL
  Filled 2024-10-20: qty 1

## 2024-10-20 MED ORDER — TRAZODONE HCL 50 MG PO TABS
150.0000 mg | ORAL_TABLET | Freq: Every day | ORAL | Status: DC
  Administered 2024-10-20: 150 mg via ORAL
  Filled 2024-10-20: qty 3

## 2024-10-20 MED ORDER — ACETAMINOPHEN 325 MG PO TABS: 650.0000 mg | ORAL_TABLET | Freq: Four times a day (QID) | ORAL | Status: DC | PRN

## 2024-10-20 MED ORDER — METOPROLOL TARTRATE 25 MG PO TABS
25.0000 mg | ORAL_TABLET | Freq: Two times a day (BID) | ORAL | Status: DC
  Administered 2024-10-20 – 2024-10-21 (×2): 25 mg via ORAL
  Filled 2024-10-20 (×2): qty 1

## 2024-10-20 MED ORDER — INSULIN REGULAR(HUMAN) IN NACL 100-0.9 UT/100ML-% IV SOLN
INTRAVENOUS | Status: DC
  Administered 2024-10-20: 15 [IU]/h via INTRAVENOUS
  Filled 2024-10-20: qty 100

## 2024-10-20 MED ORDER — LACTATED RINGERS IV SOLN: INTRAVENOUS | Status: DC

## 2024-10-20 MED ORDER — ALUM & MAG HYDROXIDE-SIMETH 200-200-20 MG/5ML PO SUSP
30.0000 mL | Freq: Once | ORAL | Status: AC
  Administered 2024-10-20: 30 mL via ORAL
  Filled 2024-10-20: qty 30

## 2024-10-20 MED ORDER — ONDANSETRON HCL 4 MG/2ML IJ SOLN: 4.0000 mg | Freq: Four times a day (QID) | INTRAMUSCULAR | Status: DC | PRN

## 2024-10-20 NOTE — ED Provider Notes (Signed)
 [place] EMERGENCY DEPARTMENT AT [facility] Provider Note   CSN: [id number] Arrival date & time: 10/20/24  1202     Patient presents with: Abdominal Pain   Whitney Benton is a 53 y.o. female.  Patient with past history significant for type 2 diabetes, DKA, obesity, hypertension, gastroparesis presents emergency department concerns of abdominal pain.  She currently endorses that pain is primarily towards the epigastrium and bandlike over the upper abdomen.  Denies any diarrhea.  States the pain is present at all times more since she tries to eat or drink anything.  She endorses some nausea but denies any vomiting or diarrhea.   Abdominal Pain      Prior to Admission medications   Medication Sig Start Date End Date Taking? Authorizing Provider  albuterol  (VENTOLIN  HFA) 108 (90 Base) MCG/ACT inhaler Inhale 2 puffs into the lungs every 6 (six) hours as needed for wheezing or shortness of breath (Cough). 05/11/24  Yes [name], MD  amLODipine  (NORVASC ) 10 MG tablet Take 1 tablet (10 mg total) by mouth daily. 05/20/24  Yes [name], MD  atorvastatin  (LIPITOR) 40 MG tablet Take 1 tablet (40 mg total) by mouth daily. 05/11/24  Yes Motwani, Komal, MD  busPIRone  (BUSPAR ) 30 MG tablet Take 1 tablet (30 mg total) by mouth 2 (two) times daily. Patient taking differently: Take 30 mg by mouth 3 (three) times daily. 09/15/24  Yes Harl Regan E, NP  Cholecalciferol  (VITAMIN D3) 10 MCG (400 UNIT) CAPS Take 400 Units by mouth daily.   Yes [provider]  Continuous Glucose Sensor (DEXCOM G7 SENSOR) MISC Change sensor every 10 days. 03/03/24  Yes Motwani, Komal, MD  cycloSPORINE  (RESTASIS ) 0.05 % ophthalmic emulsion Apply 1 drop into both eyes twice a day 09/19/21  Yes   diclofenac  (VOLTAREN ) 50 MG EC tablet Take 50 mg by mouth 2 (two) times daily as needed for mild pain (pain score 1-3).   Yes [provider]  DULoxetine  (CYMBALTA ) 60 MG capsule Take 1  capsule (60 mg total) by mouth 2 (two) times daily. Patient taking differently: Take 120 mg by mouth in the morning. 09/21/24  Yes Harl Regan BRAVO, NP  Erenumab -aooe (AIMOVIG ) 140 MG/ML SOAJ Inject 140 mg into the skin every 28 (twenty-eight) days. 09/17/24  Yes Jaffe, Adam R, DO  ezetimibe  (ZETIA ) 10 MG tablet Take 1 tablet (10 mg total) by mouth daily. 09/16/24 12/15/24 Yes Tobb, Kardie, DO  gabapentin  (NEURONTIN ) 300 MG capsule Take 1 capsule (300 mg total) by mouth 3 (three) times daily. 09/15/24  Yes Harl Regan E, NP  Glucagon  (BAQSIMI  ONE PACK) 3 MG/DOSE POWD Place 1 Device into the nose as needed (Low blood sugar with impaired consciousness). 11/24/23  Yes Motwani, Komal, MD  hydrOXYzine  (ATARAX ) 50 MG tablet Take 1 tablet (50 mg total) by mouth 3 (three) times daily. Patient taking differently: Take 50 mg by mouth 3 (three) times daily with meals. 09/15/24  Yes Harl Regan E, NP  insulin  regular human CONCENTRATED (HUMULIN  R U-500 KWIKPEN) 500 UNIT/ML KwikPen Inject 85-95 Units into the skin daily with breakfast AND 75-80 Units daily before lunch AND 65-70 Units daily before supper. Take 30 minutes before meals. Patient taking differently: Inject 135 units into the skin before breakfast (morning) and 85 units before supper (evening). Use 30 minutes before meals TWICE a day. 07/15/24  Yes Motwani, Komal, MD  loratadine  (CLARITIN ) 10 MG tablet Take 1 tablet (10 mg total) by mouth daily as needed  for allergies. Patient taking differently: Take 10 mg by mouth daily as needed (for seasonal allergies). 05/11/24  Yes [name], MD  metoprolol  tartrate (LOPRESSOR ) 25 MG tablet Take 1 tablet (25 mg total) by mouth 2 (two) times daily. 03/23/24  Yes Wyn Jackee VEAR Mickey., NP  Multiple Vitamins-Minerals (EYE VITAMINS PO) Take 1 capsule by mouth in the morning and at bedtime.   Yes [provider]  OLANZapine -Samidorphan (LYBALVI ) 15-10 MG TABS Take 15 mg by mouth at bedtime. Patient  taking differently: Take 1 tablet by mouth at bedtime. 09/15/24  Yes Harl Regan E, NP  omeprazole  (PRILOSEC) 40 MG capsule TAKE 1 CAPSULE BY MOUTH ONCE DAILY 30 MINUTES  BEFORE BREAKFAST Patient taking differently: Take 40 mg by mouth daily before breakfast. 07/12/24  Yes Kennedy-Smith, Colleen M, NP  ondansetron  (ZOFRAN -ODT) 4 MG disintegrating tablet Take 1 tablet (4 mg total) by mouth every 8 (eight) hours as needed for nausea or vomiting. Patient taking differently: Take 4 mg by mouth every 8 (eight) hours as needed for nausea or vomiting (dissolve orally). 04/29/24  Yes [name], MD  PRESCRIPTION MEDICATION CPAP- At bedtime and during any time of rest   Yes [provider]  QUEtiapine  (SEROQUEL ) 25 MG tablet Take 1 tablet (25 mg total) by mouth at bedtime as needed. Patient taking differently: Take 25 mg by mouth at bedtime as needed (for sleep). 09/15/24  Yes Harl Regan E, NP  SUMAtriptan  (IMITREX ) 100 MG tablet TAKE 1 TABLET EARLIEST ONSET OF MIGRAINE. MAY REPEAT IN 2 HOURS IF HEADACHE PERSISTS OR RECURS. MAXIMUM 2 TABLETS IN 24 HOURS. Patient taking differently: Take 100 mg by mouth See admin instructions. TAKE 100 MG BY MOUTH AT THE EARLIEST ONSET OF A MIGRAINE. MAY REPEAT IN 2 HOURS IF HEADACHE PERSISTS OR RECURS. MAXIMUM OF 200 MG/24 HOURS. 03/11/24 03/11/25 Yes Jaffe, Adam R, DO  traZODone  (DESYREL ) 150 MG tablet Take 1 tablet (150 mg total) by mouth at bedtime as needed for sleep. Patient taking differently: Take 150 mg by mouth at bedtime. 09/15/24  Yes Harl Regan E, NP  TYLENOL  8 HOUR ARTHRITIS PAIN 650 MG CR tablet Take 1,300 mg by mouth every 8 (eight) hours as needed for pain.   Yes [provider]  vitamin B-12 (CYANOCOBALAMIN) 50 MCG tablet Take 50 mcg by mouth daily.   Yes [provider]  Accu-Chek Softclix Lancets lancets Use to check blood sugar 3 times daily 08/11/23   [name], MD  baclofen  (LIORESAL ) 10 MG tablet  Take 1 tablet (10 mg total) by mouth 2 (two) times daily as needed for muscle spasms. Patient not taking: Reported on 10/20/2024 08/29/24   Raspet, Erin K, PA-C  Blood Glucose Monitoring Suppl (ACCU-CHEK GUIDE) w/Device KIT Use to check blood sugar 3 times daily. 08/11/23   [name], MD  Blood Pressure Monitoring (BLOOD PRESSURE CUFF) MISC 1 each by Does not apply route daily. Please provide Large cuff 03/23/24   Wyn Jackee VEAR Mickey., NP  ciclopirox  (PENLAC ) 8 % solution Apply topically at bedtime. Apply over nail and surrounding skin. Apply daily over previous coat. After seven (7) days, may remove with alcohol and continue cycle. Patient not taking: Reported on 10/20/2024 07/19/24   Lamount Ethan CROME, DPM  fluticasone  (FLONASE ) 50 MCG/ACT nasal spray Place 1 spray into both nostrils daily as needed for allergies or rhinitis. Patient not taking: Reported on 10/20/2024 05/11/24   [name], MD  furosemide  (LASIX ) 20 MG  tablet Take 1 tablet (20 mg total) by mouth daily as needed (weight gain). Patient not taking: Reported on 10/20/2024 03/23/24   Wyn Jackee VEAR Mickey., NP  glucose blood (ACCU-CHEK GUIDE) test strip Use to check blood sugar 3 times daily 08/11/23   [name], MD  Insulin  Pen Needle (PEN NEEDLES) 31G X 5 MM MISC Use to inject insulin  three times daily. 05/18/24   [name], MD  lidocaine  (XYLOCAINE ) 5 % ointment Apply 1 Application topically 3 (three) times daily as needed. Patient not taking: Reported on 10/20/2024 08/29/24   Raspet, Erin K, PA-C  metoCLOPramide  (REGLAN ) 5 MG tablet Take 1 tablet (5 mg total) by mouth every 8 (eight) hours as needed for nausea. Dose decrease Patient not taking: Reported on 10/20/2024 05/11/24   [name], MD  mometasone -formoterol  (DULERA) 100-5 MCG/ACT AERO Inhale 2 puffs into the lungs in the morning and at bedtime. Patient not taking: Reported on 10/20/2024 05/24/24   Meade Verdon RAMAN, MD  ondansetron  (ZOFRAN ) 4 MG tablet Take  1 tablet (4 mg total) by mouth every 8 (eight) hours as needed for up to 4 days. Patient not taking: Reported on 10/20/2024 10/19/24 10/23/24  Gennaro Bouchard L, DO  pantoprazole  (PROTONIX ) 20 MG tablet Take 2 tablets (40 mg total) by mouth daily. Patient not taking: Reported on 10/20/2024 10/19/24   Gennaro Bouchard L, DO  sucralfate  (CARAFATE ) 1 g tablet Take 1 tablet (1 g total) by mouth 4 (four) times daily -  with meals and at bedtime for 7 days. Patient not taking: Reported on 10/20/2024 10/19/24 10/26/24  Gennaro Bouchard L, DO    Allergies: Aspirin, Trulicity  [dulaglutide ], and Oxycodone     Review of Systems  Gastrointestinal:  Positive for abdominal pain.  All other systems reviewed and are negative.   Updated Vital Signs BP (!) 143/90   Pulse 98   Temp 97.7 F (36.5 C) (Oral)   Resp 18   SpO2 99%   Physical Exam Vitals and nursing note reviewed.  Constitutional:      General: She is not in acute distress.    Appearance: She is well-developed.  HENT:     Head: Normocephalic and atraumatic.     Mouth/Throat:     Comments: Dry Eyes:     Conjunctiva/sclera: Conjunctivae normal.  Cardiovascular:     Rate and Rhythm: Normal rate and regular rhythm.     Heart sounds: No murmur heard. Pulmonary:     Effort: Pulmonary effort is normal. No respiratory distress.     Breath sounds: Normal breath sounds.  Abdominal:     Palpations: Abdomen is soft.     Tenderness: There is abdominal tenderness in the epigastric area. There is no right CVA tenderness, left CVA tenderness or guarding.  Musculoskeletal:        General: No swelling.     Cervical back: Neck supple.  Skin:    General: Skin is warm and dry.     Capillary Refill: Capillary refill takes 2 to 3 seconds.  Neurological:     Mental Status: She is alert.  Psychiatric:        Mood and Affect: Mood normal.     (all labs ordered are listed, but only abnormal results are displayed) Labs Reviewed  COMPREHENSIVE  METABOLIC PANEL WITH GFR - Abnormal; Notable for the following components:      Result Value   Chloride 96 (*)    Glucose, Bld 673 (*)    Creatinine, Ser  1.04 (*)    Total Protein 8.3 (*)    Alkaline Phosphatase 127 (*)    Anion gap 18 (*)    All other components within normal limits  CBC - Abnormal; Notable for the following components:   MCH 25.9 (*)    Platelets 492 (*)    All other components within normal limits  URINALYSIS, ROUTINE W REFLEX MICROSCOPIC - Abnormal; Notable for the following components:   Color, Urine COLORLESS (*)    Glucose, UA >=500 (*)    Ketones, ur 80 (*)    All other components within normal limits  BETA-HYDROXYBUTYRIC ACID - Abnormal; Notable for the following components:   Beta-Hydroxybutyric Acid 2.72 (*)    All other components within normal limits  CBG MONITORING, ED - Abnormal; Notable for the following components:   Glucose-Capillary 461 (*)    All other components within normal limits  CBG MONITORING, ED - Abnormal; Notable for the following components:   Glucose-Capillary 398 (*)    All other components within normal limits  MRSA NEXT GEN BY PCR, NASAL  LIPASE, BLOOD  BLOOD GAS, VENOUS  HIV ANTIBODY (ROUTINE TESTING W REFLEX)  CBC  BASIC METABOLIC PANEL WITH GFR  BASIC METABOLIC PANEL WITH GFR  BASIC METABOLIC PANEL WITH GFR  BETA-HYDROXYBUTYRIC ACID  BETA-HYDROXYBUTYRIC ACID    EKG: None  Radiology: CT ABDOMEN PELVIS W CONTRAST Result Date: 10/20/2024 CLINICAL DATA:  Abdominal pain. EXAM: CT ABDOMEN AND PELVIS WITH CONTRAST TECHNIQUE: Multidetector CT imaging of the abdomen and pelvis was performed using the standard protocol following bolus administration of intravenous contrast. RADIATION DOSE REDUCTION: This exam was performed according to the departmental dose-optimization program which includes automated exposure control, adjustment of the mA and/or kV according to patient size and/or use of iterative reconstruction technique.  CONTRAST:  OMNIPAQUE  IOHEXOL  300 MG/ML  SOLN COMPARISON:  04/30/2024 FINDINGS: Lower chest: Heart is normal size.  Visualized lung bases are clear. Hepatobiliary: Mild diffuse low-attenuation of the liver compatible with a degree of steatosis. No focal liver mass. Gallbladder not visualized. Biliary tree is normal. Pancreas: Normal. Spleen: Normal. Adrenals/Urinary Tract: Adrenal glands are normal. Kidneys are normal in size without hydronephrosis or nephrolithiasis. Ureters and bladder are normal. Stomach/Bowel: Stomach and small bowel are normal. Appendix is normal. Colon is normal. Vascular/Lymphatic: Abdominal aorta is normal in caliber. Remaining vascular structures are unremarkable. No adenopathy. Reproductive: Status post hysterectomy. No adnexal masses. Other: No free fluid or focal inflammatory change. Musculoskeletal: No focal abnormality. IMPRESSION: 1. No acute findings in the abdomen/pelvis. 2. Mild hepatic steatosis. Electronically Signed   By: Toribio Agreste M.D.   On: 10/20/2024 17:14     .Critical Care  Performed by: Gaetana Kawahara A, PA-C Authorized by: Zyhir Cappella A, PA-C   Critical care provider statement:    Critical care time (minutes):  37   Critical care start time:  10/20/2024 6:13 PM   Critical care end time:  10/20/2024 7:00 PM   Critical care time was exclusive of:  Separately billable procedures and treating other patients   Critical care was necessary to treat or prevent imminent or life-threatening deterioration of the following conditions:  Metabolic crisis   Critical care was time spent personally by me on the following activities:  Development of treatment plan with patient or surrogate, discussions with consultants, evaluation of patient's response to treatment, examination of patient, ordering and review of laboratory studies, ordering and review of radiographic studies, re-evaluation of patient's condition and review of old charts  I assumed direction of  critical care for this patient from another provider in my specialty: no     Care discussed with: admitting provider      Medications Ordered in the ED  insulin  regular, human (MYXREDLIN ) 100 units/ 100 mL infusion (15 Units/hr Intravenous New Bag/Given 10/20/24 2037)  lactated ringers  infusion ( Intravenous New Bag/Given 10/20/24 2049)  dextrose  5 % in lactated ringers  infusion (0 mLs Intravenous Hold 10/20/24 2046)  dextrose  50 % solution 0-50 mL (has no administration in time range)  acetaminophen  (TYLENOL ) tablet 650 mg (has no administration in time range)    Or  acetaminophen  (TYLENOL ) suppository 650 mg (has no administration in time range)  senna-docusate (Senokot-S) tablet 1 tablet (has no administration in time range)  bisacodyl (DULCOLAX) EC tablet 5 mg (has no administration in time range)  ondansetron  (ZOFRAN ) tablet 4 mg (has no administration in time range)    Or  ondansetron  (ZOFRAN ) injection 4 mg (has no administration in time range)  enoxaparin  (LOVENOX ) injection 60 mg (has no administration in time range)  Chlorhexidine  Gluconate Cloth 2 % PADS 6 each (has no administration in time range)  Oral care mouth rinse (has no administration in time range)  lactated ringers  bolus 2,000 mL (0 mLs Intravenous Stopped 10/20/24 2050)  insulin  aspart (novoLOG ) injection 10 Units (10 Units Subcutaneous Given 10/20/24 1555)  alum & mag hydroxide-simeth (MAALOX/MYLANTA) 200-200-20 MG/5ML suspension 30 mL (30 mLs Oral Given 10/20/24 1550)    And  lidocaine  (XYLOCAINE ) 2 % viscous mouth solution 15 mL (15 mLs Oral Given 10/20/24 1551)  famotidine  (PEPCID ) tablet 20 mg (20 mg Oral Given 10/20/24 1552)  fentaNYL (SUBLIMAZE) injection 25 mcg (25 mcg Intravenous Given 10/20/24 1613)  iohexol  (OMNIPAQUE ) 300 MG/ML solution 100 mL (100 mLs Intravenous Contrast Given 10/20/24 1635)                                    Medical Decision Making Amount and/or Complexity of Data  Reviewed Labs: ordered. Radiology: ordered.  Risk OTC drugs. Prescription drug management. Decision regarding hospitalization.   This patient presents to the ED for concern of abdominal pain, this involves an extensive number of treatment options, and is a complaint that carries with it a high risk of complications and morbidity.  The differential diagnosis includes gastritis, atrophic gastritis, GERD, gastroparesis, bowel obstruction   Co morbidities that complicate the patient evaluation  Type 2 diabetes, hypertension, obesity, tobacco use, substance use disorder, gallstone pancreatitis   Additional history obtained:  Additional history obtained from epic chart review   Lab Tests:  I Ordered, and personally interpreted labs.  The pertinent results include: CBC unremarkable, CMP with significant hyperglycemia and elevated anion gap at 18, lipase unremarkable at 28, UA without signs of infection but ketonuria seen, VBG no acidosis, beta-hydroxybutyrate acid elevated at 2.72, repeat CBG shows improved glucose at 471   Imaging Studies ordered:  I ordered imaging studies including CT abdomen pelvis I independently visualized and interpreted imaging which showed no acute findings in the abdomen or pelvis I agree with the radiologist interpretation   Consultations Obtained:  I requested consultation with the hospitalist,  and discussed lab and imaging findings as well as pertinent plan - they recommend: Spoke with Dr.Amponsah, hospitalist, will be admitting patient.   Problem List / ED Course / Critical interventions / Medication management  Patient presents to the emergency department today with concerns  of generalized abdominal pain.  Reports being seen recently for similar concerns and states she has ongoing pain and difficulty with follow-up due to current symptoms.  Has been struggling to take her home medications due to her current symptoms. Physical exam reveals  generalized abdominal tenderness.  No abnormal heart or lung sounds.  Possibly slightly dehydrated with delayed capillary refill and dry mucous membranes. See me reveals significant hyperglycemia with glucose at 673.  Slight elevated anion gap at 18.  Added on DKA labs for further evaluation including VBG and beta hydroxybutyric acid.  Other labs unremarkable. UA shows ketonuria.  VBG shows no acidosis.  Beta hydroxybutyric acid elevated 2.72.  Suspect likely either developing DKA versus hyperglycemia.  Will consult was for admission as glucose has downtrended with fluids and insulin  but will require more aggressive treatment.  CT abdomen pelvis negative. Spoke with Dr.Amponsah, hospitalist, will be admitting patient. I ordered medication including fluids, insulin  aspart, Maalox, Pepcid , fentanyl for hyperglycemia, GI cocktail, pain Reevaluation of the patient after these medicines showed that the patient improved I have reviewed the patients home medicines and have made adjustments as needed   Social Determinants of Health:  Type II diabetic   Test / Admission - Considered:  Patient requiring admission.  Final diagnoses:  Type 2 diabetes mellitus with hyperglycemia, with long-term current use of insulin  [facility])    ED Discharge Orders     None          Cecily Legrand DELENA DEVONNA 10/20/24 2104    Laurice Maude BROCKS, MD 10/22/24 [phone]

## 2024-10-20 NOTE — ED Triage Notes (Signed)
 Pt arriving POV with upper and lower abdominal pain. Pt reports she tried to eat soup today but was unable to eat much. Pt reports the pain is worse in her lower abdomen. Pt is diabetic and has been unable to take her medications since she has not been able to eat/drink normally.

## 2024-10-20 NOTE — Plan of Care (Signed)
 New admission up from ED, Dx. Hyperglycemia, admission, education and assessment completed, bed in lowest locked position with side rails up, call bell in reach, bed alarm on and mat on floor bedside table in reach, plan of care and goals reviewed time given for questions.  Problem: Education: Goal: Knowledge of General Education information will improve Description: Including pain rating scale, medication(s)/side effects and non-pharmacologic comfort measures Outcome: Progressing   Problem: Clinical Measurements: Goal: Ability to maintain clinical measurements within normal limits will improve Outcome: Progressing Goal: Will remain free from infection Outcome: Progressing Goal: Diagnostic test results will improve Outcome: Progressing Goal: Respiratory complications will improve Outcome: Progressing Goal: Cardiovascular complication will be avoided Outcome: Progressing   Problem: Activity: Goal: Risk for activity intolerance will decrease Outcome: Progressing   Problem: Elimination: Goal: Will not experience complications related to bowel motility Outcome: Progressing Goal: Will not experience complications related to urinary retention Outcome: Progressing   Problem: Pain Managment: Goal: General experience of comfort will improve and/or be controlled Outcome: Progressing

## 2024-10-20 NOTE — H&P (Signed)
 History and Physical  Whitney Benton FMW:[id number] DOB: December 09, 1971 DOA: 10/20/2024  PCP: [name], MD   Chief Complaint: Abdominal pain, nausea vomiting  HPI: Whitney Benton is a 53 y.o. female with medical history significant for type 2 diabetes, DKA, diabetes gastroparesis, HTN, HLD, anxiety and depression, GERD, and asthma who presented to the ED for evaluation of abdominal pain, nausea and vomiting.  Patient reports she has been under a lot of stress over the last months but that over the weekend, she started having nausea and vomiting. She also reports associated epigastric pain described as burning.  She has not had any food over the last 2 to 3 days but has been taking her insulin  as prescribed.  She denies any dizziness, headache, chest pain, shortness of breath, dysuria, fevers or chills.  Per chart review, patient presented to the ED on 10/28.  Workup was overall unremarkable, was diagnosed with gastritis and received IV fluid, IV Zofran  and GI cocktail prior to discharge home.  Patient reports that when she woke up this morning, her meter read HI so she presented to the ED for evaluation of her ongoing GI symptoms and hyperglycemia.  ED Course: Initial vitals show afebrile, tachycardic with HR 100-110s, SBP 130-160s, SpO2 100% on room air. Initial labs significant for sodium 137, K+ 4.6, bicarb 22, glucose 673, creatinine 1.04, anion gap 18, alk phos 127, BHB 272, UA shows glucosuria and ketonuria but no signs of infection.  CT A/P with no acute intra-abdominal pathology.  Pt received GI cocktail, famotidine , IV fentanyl, IV LR 2 L bolus and started on insulin  drip. [facility] was consulted for admission.   Review of Systems: Please see HPI for pertinent positives and negatives. A complete 10 system review of systems are otherwise negative.  Past Medical History:  Diagnosis Date   Allergy    Anxiety    Arthritis    Asthma    Depression    Diabetes mellitus without complication  (HCC)    GERD (gastroesophageal reflux disease)    Glaucoma suspect    Hyperlipidemia    Trichomonas infection    Past Surgical History:  Procedure Laterality Date   CESAREAN SECTION     UPPER GASTROINTESTINAL ENDOSCOPY     VAGINAL HYSTERECTOMY  12/23/2004   Fibroids, menorrhagia, benign pathology   Social History:  reports that she quit smoking about 4 years ago. Her smoking use included cigarettes. She started smoking about 17 years ago. She has a 6.5 pack-year smoking history. She has been exposed to tobacco smoke. She has never used smokeless tobacco. She reports that she does not currently use alcohol. She reports current drug use. Drug: Marijuana.  Allergies  Allergen Reactions   Aspirin Hives   Trulicity  [Dulaglutide ] Other (See Comments)    DC'd due to chronic gastric and abdominal pain with nausea   Oxycodone  Nausea And Vomiting    Family History  Problem Relation Age of Onset   Diabetes Mother    Mental illness Mother    Depression Mother    Hypertension Mother    Colon cancer Father 27   Hypertension Father    Diabetes Father    Colon cancer Paternal Grandmother    Esophageal cancer Neg Hx    Stomach cancer Neg Hx    Rectal cancer Neg Hx    Asthma Neg Hx    Breast cancer Neg Hx      Prior to Admission medications   Medication Sig Start Date End Date Taking?  Authorizing Provider  Accu-Chek Softclix Lancets lancets Use to check blood sugar 3 times daily 08/11/23   [name], MD  acetaminophen  (TYLENOL ) 650 MG CR tablet Take 650 mg by mouth every 8 (eight) hours as needed for pain.    [provider]  acetaminophen  (TYLENOL ) 650 MG CR tablet Take 650 mg by mouth every 8 (eight) hours as needed for pain.    [provider]  albuterol  (VENTOLIN  HFA) 108 (90 Base) MCG/ACT inhaler Inhale 2 puffs into the lungs every 6 (six) hours as needed for wheezing or shortness of breath (Cough). 05/11/24   [name], MD  amLODipine  (NORVASC ) 10  MG tablet Take 1 tablet (10 mg total) by mouth daily. 05/20/24   [name], MD  atorvastatin  (LIPITOR) 40 MG tablet Take 1 tablet (40 mg total) by mouth daily. 05/11/24   Motwani, Komal, MD  baclofen  (LIORESAL ) 10 MG tablet Take 1 tablet (10 mg total) by mouth 2 (two) times daily as needed for muscle spasms. 08/29/24   Raspet, Erin K, PA-C  Blood Glucose Monitoring Suppl (ACCU-CHEK GUIDE) w/Device KIT Use to check blood sugar 3 times daily. 08/11/23   [name], MD  Blood Pressure Monitoring (BLOOD PRESSURE CUFF) MISC 1 each by Does not apply route daily. Please provide Large cuff 03/23/24   Wyn Jackee VEAR Mickey., NP  busPIRone  (BUSPAR ) 30 MG tablet Take 1 tablet (30 mg total) by mouth 2 (two) times daily. 09/15/24   Harl Zane BRAVO, NP  Cholecalciferol  (VITAMIN D3) 10 MCG (400 UNIT) CAPS Take 400 Units by mouth daily.    [provider]  ciclopirox  (PENLAC ) 8 % solution Apply topically at bedtime. Apply over nail and surrounding skin. Apply daily over previous coat. After seven (7) days, may remove with alcohol and continue cycle. 07/19/24   Lamount Ethan CROME, DPM  Continuous Glucose Sensor (DEXCOM G7 SENSOR) MISC Change sensor every 10 days. 03/03/24   Motwani, Komal, MD  cycloSPORINE  (RESTASIS ) 0.05 % ophthalmic emulsion Apply 1 drop into both eyes twice a day 09/19/21     DULoxetine  (CYMBALTA ) 60 MG capsule Take 1 capsule (60 mg total) by mouth 2 (two) times daily. 09/21/24   Harl Zane BRAVO, NP  Erenumab -aooe (AIMOVIG ) 140 MG/ML SOAJ Inject 140 mg into the skin every 28 (twenty-eight) days. 09/17/24   Skeet Juliene SAUNDERS, DO  ezetimibe  (ZETIA ) 10 MG tablet Take 1 tablet (10 mg total) by mouth daily. 09/16/24 12/15/24  Tobb, Kardie, DO  fluticasone  (FLONASE ) 50 MCG/ACT nasal spray Place 1 spray into both nostrils daily as needed for allergies or rhinitis. 05/11/24   [name], MD  furosemide  (LASIX ) 20 MG tablet Take 1 tablet (20 mg total) by mouth daily as needed (weight gain).  03/23/24   Wyn Jackee VEAR Mickey., NP  gabapentin  (NEURONTIN ) 300 MG capsule Take 1 capsule (300 mg total) by mouth 3 (three) times daily. 09/15/24   Harl Zane BRAVO, NP  Glucagon  (BAQSIMI  ONE PACK) 3 MG/DOSE POWD Place 1 Device into the nose as needed (Low blood sugar with impaired consciousness). 11/24/23   Motwani, Komal, MD  glucose blood (ACCU-CHEK GUIDE) test strip Use to check blood sugar 3 times daily 08/11/23   [name], MD  hydrOXYzine  (ATARAX ) 50 MG tablet Take 1 tablet (50 mg total) by mouth 3 (three) times daily. 09/15/24   Harl Zane BRAVO, NP  Insulin  Pen Needle (PEN NEEDLES) 31G X 5 MM MISC Use to inject insulin  three times  daily. 05/18/24   [name], MD  insulin  regular human CONCENTRATED (HUMULIN  R U-500 KWIKPEN) 500 UNIT/ML KwikPen Inject 85-95 Units into the skin daily with breakfast AND 75-80 Units daily before lunch AND 65-70 Units daily before supper. Take 30 minutes before meals. 07/15/24   Motwani, Obadiah, MD  lidocaine  (XYLOCAINE ) 5 % ointment Apply 1 Application topically 3 (three) times daily as needed. 08/29/24   Raspet, Erin K, PA-C  loratadine  (CLARITIN ) 10 MG tablet Take 1 tablet (10 mg total) by mouth daily as needed for allergies. 05/11/24   [name], MD  metoCLOPramide  (REGLAN ) 5 MG tablet Take 1 tablet (5 mg total) by mouth every 8 (eight) hours as needed for nausea. Dose decrease 05/11/24   [name], MD  metoprolol  tartrate (LOPRESSOR ) 25 MG tablet Take 1 tablet (25 mg total) by mouth 2 (two) times daily. 03/23/24   Wyn Jackee VEAR Mickey., NP  mometasone -formoterol  (DULERA) 100-5 MCG/ACT AERO Inhale 2 puffs into the lungs in the morning and at bedtime. 05/24/24   Desai, Nikita S, MD  Multiple Vitamins-Minerals (EYE VITAMINS PO) Take 1 capsule by mouth daily.    [provider]  OLANZapine -Samidorphan (LYBALVI ) 15-10 MG TABS Take 15 mg by mouth at bedtime. 09/15/24   Harl Zane BRAVO, NP  omeprazole  (PRILOSEC) 40 MG capsule TAKE 1  CAPSULE BY MOUTH ONCE DAILY 30 MINUTES  BEFORE BREAKFAST 07/12/24   Kennedy-Smith, Colleen M, NP  ondansetron  (ZOFRAN ) 4 MG tablet Take 1 tablet (4 mg total) by mouth every 8 (eight) hours as needed for up to 4 days. 10/19/24 10/23/24  Kammerer, Megan L, DO  ondansetron  (ZOFRAN -ODT) 4 MG disintegrating tablet Take 1 tablet (4 mg total) by mouth every 8 (eight) hours as needed for nausea or vomiting. 04/29/24   [name], MD  pantoprazole  (PROTONIX ) 20 MG tablet Take 2 tablets (40 mg total) by mouth daily. 10/19/24   Kammerer, Megan L, DO  QUEtiapine  (SEROQUEL ) 25 MG tablet Take 1 tablet (25 mg total) by mouth at bedtime as needed. 09/15/24   Harl Zane BRAVO, NP  sucralfate  (CARAFATE ) 1 g tablet Take 1 tablet (1 g total) by mouth 4 (four) times daily -  with meals and at bedtime for 7 days. 10/19/24 10/26/24  Kammerer, Megan L, DO  SUMAtriptan  (IMITREX ) 100 MG tablet TAKE 1 TABLET EARLIEST ONSET OF MIGRAINE. MAY REPEAT IN 2 HOURS IF HEADACHE PERSISTS OR RECURS. MAXIMUM 2 TABLETS IN 24 HOURS. 03/11/24 03/11/25  Skeet Juliene SAUNDERS, DO  traZODone  (DESYREL ) 150 MG tablet Take 1 tablet (150 mg total) by mouth at bedtime as needed for sleep. 09/15/24   Harl Zane BRAVO, NP  vitamin B-12 (CYANOCOBALAMIN) 50 MCG tablet Take 50 mcg by mouth daily.    [provider]    Physical Exam: BP 134/74 (BP Location: Left Arm)   Pulse (!) 108   Temp 97.7 F (36.5 C) (Oral)   Resp 18   SpO2 98%  General: Pleasant, well-appearing middle-age woman sitting in bed. No acute distress. HEENT: [place]/AT. Anicteric sclera CV: RRR. No murmurs, rubs, or gallops. No LE edema Pulmonary: Lungs CTAB. Normal effort. No wheezing or rales. Abdominal: Soft, nondistended. Minimal epigastric tenderness. Normal bowel sounds. Extremities: Palpable radial and DP pulses. Normal ROM. Skin: Warm and dry. No obvious rash or lesions. Neuro: A&Ox3. Moves all extremities. Normal sensation to light touch. No focal deficit. Psych:  Normal mood and affect          Labs on Admission:  Basic Metabolic Panel: Recent Labs  Lab 10/19/24 0856 10/20/24 1303  NA 140 137  K 3.3* 4.6  CL 100 96*  CO2 26 22  GLUCOSE 255* 673*  BUN 10 12  CREATININE 0.98 1.04*  CALCIUM  9.7 9.7   Liver Function Tests: Recent Labs  Lab 10/19/24 0856 10/20/24 1303  AST 28 29  ALT 22 24  ALKPHOS 113 127*  BILITOT 0.6 0.7  PROT 8.0 8.3*  ALBUMIN 4.5 4.5   Recent Labs  Lab 10/19/24 0856 10/20/24 1303  LIPASE 23 28   No results for input(s): AMMONIA in the last 168 hours. CBC: Recent Labs  Lab 10/19/24 0856 10/20/24 1303  WBC 8.4 10.0  NEUTROABS 4.8  --   HGB 12.8 12.5  HCT 42.3 41.3  MCV 84.3 85.5  PLT 526* 492*   Cardiac Enzymes: No results for input(s): CKTOTAL, CKMB, CKMBINDEX, TROPONINI in the last 168 hours. BNP (last 3 results) No results for input(s): BNP in the last 8760 hours.  ProBNP (last 3 results) No results for input(s): PROBNP in the last 8760 hours.  CBG: Recent Labs  Lab 10/20/24 1758  GLUCAP 461*    Radiological Exams on Admission: CT ABDOMEN PELVIS W CONTRAST Result Date: 10/20/2024 CLINICAL DATA:  Abdominal pain. EXAM: CT ABDOMEN AND PELVIS WITH CONTRAST TECHNIQUE: Multidetector CT imaging of the abdomen and pelvis was performed using the standard protocol following bolus administration of intravenous contrast. RADIATION DOSE REDUCTION: This exam was performed according to the departmental dose-optimization program which includes automated exposure control, adjustment of the mA and/or kV according to patient size and/or use of iterative reconstruction technique. CONTRAST:  OMNIPAQUE  IOHEXOL  300 MG/ML  SOLN COMPARISON:  04/30/2024 FINDINGS: Lower chest: Heart is normal size.  Visualized lung bases are clear. Hepatobiliary: Mild diffuse low-attenuation of the liver compatible with a degree of steatosis. No focal liver mass. Gallbladder not visualized. Biliary tree is normal.  Pancreas: Normal. Spleen: Normal. Adrenals/Urinary Tract: Adrenal glands are normal. Kidneys are normal in size without hydronephrosis or nephrolithiasis. Ureters and bladder are normal. Stomach/Bowel: Stomach and small bowel are normal. Appendix is normal. Colon is normal. Vascular/Lymphatic: Abdominal aorta is normal in caliber. Remaining vascular structures are unremarkable. No adenopathy. Reproductive: Status post hysterectomy. No adnexal masses. Other: No free fluid or focal inflammatory change. Musculoskeletal: No focal abnormality. IMPRESSION: 1. No acute findings in the abdomen/pelvis. 2. Mild hepatic steatosis. Electronically Signed   By: Toribio Agreste M.D.   On: 10/20/2024 17:14   Assessment/Plan Whitney Benton is a 53 y.o. female with medical history significant for type 2 diabetes, DKA, diabetes gastroparesis, HTN, HLD, anxiety and depression, GERD, and asthma who presented to the ED for evaluation of abdominal pain, nausea and vomiting and admitted for severe hyperglycemia and early DKA  # Hyperglycemia # Early DKA - Presented with intractable nausea and vomiting and abdominal pain - Found to have hyperglycemia, ketosis with low normal bicarb, no acidosis on VBG - Presentation concerning for early DKA - Hx of uncontrolled diabetes with last A1c 8.0% about 2 months ago - S/p LR bolus and initiation of insulin  drip in ED - Pt with minimal epigastric tenderness on exam remains hemodynamically stable - Continue insulin  drip - LR @ 125 mL/hr until CBG less than 250 - Switch to D5-LR when 1 CBG less than 250 - Keep NPO except with sips with meds and ice chips - BMP Q4H, CBG Q1H, BHB Q8H - Once anion gap closed 2, start CM diet and  if able to eat, administer Semglee  40 units - Continue insulin  drip for 1-2 more hours, then discontinue and start SSI-S  - DC fluids if eating, drinking, and off insulin  drip - Home diabetes regimen is concentrated Humulin  R U-500, 135 units with breakfast and  lunch and 80-85 units with dinner  # Diabetic gastroparesis # Intractable nausea and vomiting - Acute nausea and vomiting likely secondary to worsening gastroparesis in the setting of elevated blood sugars - CT of the abdomen does not show any acute abnormalities - Continue Carafate  and as needed Reglan   # Gastritis # GERD - Continue PPI  # HTN - BP elevated with SBP in the 130s to 160s - Continue amlodipine  and Lopressor   # HLD - Continue atorvastatin  and Zetia   # Anxiety and depression - Continue BuSpar , duloxetine , hydroxyzine  and olanzapine -samidorphan  # Insomnia - Continue on trazodone  at bedtime  DVT prophylaxis: Lovenox      Code Status: Full Code  Consults called: None  Family Communication: No family at bedside  Severity of Illness: The appropriate patient status for this patient is OBSERVATION. Observation status is judged to be reasonable and necessary in order to provide the required intensity of service to ensure the patient's safety. The patient's presenting symptoms, physical exam findings, and initial radiographic and laboratory data in the context of their medical condition is felt to place them at decreased risk for further clinical deterioration. Furthermore, it is anticipated that the patient will be medically stable for discharge from the hospital within 2 midnights of admission.   Level of care: Wray Lou Claretta CHRISTELLA, MD 10/20/2024, 8:29 PM [facility] Pager: [phone] Isaiah 41:10   If 7PM-7AM, please contact night-coverage [email or web address] Password [email or web address]

## 2024-10-21 ENCOUNTER — Telehealth: Payer: Self-pay

## 2024-10-21 DIAGNOSIS — R739 Hyperglycemia, unspecified: Secondary | ICD-10-CM | POA: Diagnosis not present

## 2024-10-21 LAB — GLUCOSE, CAPILLARY
Glucose-Capillary: 159 mg/dL — ABNORMAL HIGH (ref 70–99)
Glucose-Capillary: 164 mg/dL — ABNORMAL HIGH (ref 70–99)
Glucose-Capillary: 175 mg/dL — ABNORMAL HIGH (ref 70–99)
Glucose-Capillary: 176 mg/dL — ABNORMAL HIGH (ref 70–99)
Glucose-Capillary: 180 mg/dL — ABNORMAL HIGH (ref 70–99)
Glucose-Capillary: 187 mg/dL — ABNORMAL HIGH (ref 70–99)
Glucose-Capillary: 194 mg/dL — ABNORMAL HIGH (ref 70–99)
Glucose-Capillary: 196 mg/dL — ABNORMAL HIGH (ref 70–99)
Glucose-Capillary: 202 mg/dL — ABNORMAL HIGH (ref 70–99)
Glucose-Capillary: 205 mg/dL — ABNORMAL HIGH (ref 70–99)
Glucose-Capillary: 225 mg/dL — ABNORMAL HIGH (ref 70–99)
Glucose-Capillary: 321 mg/dL — ABNORMAL HIGH (ref 70–99)

## 2024-10-21 LAB — BASIC METABOLIC PANEL WITH GFR
Anion gap: 10 (ref 5–15)
Anion gap: 9 (ref 5–15)
Anion gap: 12 (ref 5–15)
BUN: 11 mg/dL (ref 6–20)
BUN: 12 mg/dL (ref 6–20)
BUN: 12 mg/dL (ref 6–20)
CO2: 27 mmol/L (ref 22–32)
CO2: 28 mmol/L (ref 22–32)
CO2: 24 mmol/L (ref 22–32)
Calcium: 9.7 mg/dL (ref 8.9–10.3)
Calcium: 9.2 mg/dL (ref 8.9–10.3)
Calcium: 9.3 mg/dL (ref 8.9–10.3)
Chloride: 105 mmol/L (ref 98–111)
Chloride: 105 mmol/L (ref 98–111)
Chloride: 104 mmol/L (ref 98–111)
Creatinine, Ser: 0.92 mg/dL (ref 0.44–1.00)
Creatinine, Ser: 1.02 mg/dL — ABNORMAL HIGH (ref 0.44–1.00)
Creatinine, Ser: 0.95 mg/dL (ref 0.44–1.00)
GFR, Estimated: >60 mL/min (ref >60)
GFR, Estimated: >60 mL/min (ref >60)
GFR, Estimated: >60 mL/min (ref >60)
Glucose, Bld: 163 mg/dL — ABNORMAL HIGH (ref 70–99)
Glucose, Bld: 166 mg/dL — ABNORMAL HIGH (ref 70–99)
Glucose, Bld: 204 mg/dL — ABNORMAL HIGH (ref 70–99)
Potassium: 4.3 mmol/L (ref 3.5–5.1)
Potassium: 4 mmol/L (ref 3.5–5.1)
Potassium: 3.7 mmol/L (ref 3.5–5.1)
Sodium: 142 mmol/L (ref 135–145)
Sodium: 143 mmol/L (ref 135–145)
Sodium: 140 mmol/L (ref 135–145)

## 2024-10-21 LAB — BETA-HYDROXYBUTYRIC ACID
Beta-Hydroxybutyric Acid: 0.06 mmol/L (ref 0.05–0.27)
Beta-Hydroxybutyric Acid: 0.06 mmol/L (ref 0.05–0.27)
Beta-Hydroxybutyric Acid: 0.07 mmol/L (ref 0.05–0.27)

## 2024-10-21 LAB — CBC
HCT: 38 % (ref 36.0–46.0)
Hemoglobin: 11.6 g/dL — ABNORMAL LOW (ref 12.0–15.0)
MCH: 26.2 pg (ref 26.0–34.0)
MCHC: 30.5 g/dL (ref 30.0–36.0)
MCV: 85.8 fL (ref 80.0–100.0)
Platelets: 475 K/uL — ABNORMAL HIGH (ref 150–400)
RBC: 4.43 MIL/uL (ref 3.87–5.11)
RDW: 15.2 % (ref 11.5–15.5)
WBC: 9.5 K/uL (ref 4.0–10.5)
nRBC: 0 % (ref 0.0–0.2)

## 2024-10-21 LAB — HIV ANTIBODY (ROUTINE TESTING W REFLEX): HIV Screen 4th Generation wRfx: NONREACTIVE

## 2024-10-21 MED ORDER — INSULIN REGULAR HUMAN (CONC) 500 UNIT/ML ~~LOC~~ SOPN
85.0000 [IU] | PEN_INJECTOR | Freq: Every day | SUBCUTANEOUS | Status: DC
Start: 2024-10-21 — End: 2024-10-21

## 2024-10-21 MED ORDER — INSULIN REGULAR HUMAN (CONC) 500 UNIT/ML ~~LOC~~ SOPN: 75.0000 [IU] | PEN_INJECTOR | Freq: Every day | SUBCUTANEOUS | Status: DC

## 2024-10-21 MED ORDER — INSULIN REGULAR HUMAN (CONC) 500 UNIT/ML ~~LOC~~ SOPN
135.0000 [IU] | PEN_INJECTOR | Freq: Every day | SUBCUTANEOUS | Status: DC
  Administered 2024-10-21: 135 [IU] via SUBCUTANEOUS
  Filled 2024-10-21: qty 3

## 2024-10-21 NOTE — Progress Notes (Signed)
   10/21/24 1316  TOC Brief Assessment  Insurance and Status Reviewed ([place] MEDICAID PREPAID HEALTH PLAN / [place] MEDICAID [employer])  Patient has primary care physician Yes Abe, Barnie NOVAK, MD)  Home environment has been reviewed Single family home  Prior level of function: Independent with ADL's  Prior/Current Home Services No current home services  Social Drivers of Health Review SDOH reviewed interventions complete  Readmission risk has been reviewed Yes  Transition of care needs no transition of care needs at this time   Pt screened. Resources for utility assistance added to AVS. No further ICM needs at this time.

## 2024-10-21 NOTE — Discharge Instructions (Signed)
 UTILITIES [facility] [address] [place] [phone] Rental assistance/rental hotline: [phone] ext. [phone].    Utility assistance/utility hotline: [phone] ext. [address] Department of IT consultant (heating/cooling and water assistance) [phone] (rental and utility assistance) [phone]  [employer] - call [phone]

## 2024-10-21 NOTE — Discharge Summary (Signed)
 Physician Discharge Summary  Whitney Benton FMW:[id number] DOB: 11/13/1971 DOA: 10/20/2024  PCP: [name], MD  Admit date: 10/20/2024 Discharge date: 10/21/2024  Admitted From: Home Disposition: Home  Recommendations for Outpatient Follow-up:  Follow up with PCP in 1-2 weeks Follow-up with endocrinology as scheduled in November, recommend discussing insulin  pump at that time  Home Health: None Equipment/Devices: None  Discharge Condition: Stable CODE STATUS: Full Diet recommendation: Low-salt low-carb diet  Brief/Interim Summary: Whitney Benton is a 53 y.o. female with medical history significant for type 2 diabetes, DKA, diabetes gastroparesis, HTN, HLD, anxiety and depression, GERD, and asthma who presented to the ED for evaluation of abdominal pain, nausea and vomiting.  Patient admitted as above with intractable nausea vomiting abdominal pain in the setting of gastroparesis, chronic, as well as found to be in DKA at intake.  Patient symptoms improved drastically with supportive management and her DKA has now resolved, repeat discussion at bedside in regards to patient's insulin  regimen, she apparently has had multiple regimens on multiple types of insulin  with no real improvement.  We discussed insulin  pump at length which I do feel would improve her overall hyperglycemia and diabetes.  At this time she is otherwise stable and agreeable for discharge home, tolerating p.o. well without nausea vomiting abdominal pain or further symptoms.   Discharge Diagnoses:  Principal Problem:   Hyperglycemia Active Problems:   Anxiety and depression   Type 2 diabetes mellitus with hyperglycemia, with long-term current use of insulin  (HCC)   Gastroesophageal reflux disease  DKA, resolved Diabetic gastroparesis, acute on chronic with intractable nausea vomiting -resolved Hypertension, stable at baseline Hyperlipidemia continue home medications, no changes(statin,  Zetia ) Anxiety/depression -well-controlled on buspirone , duloxetine , hydroxyzine , olanzapine /samidorphan - *patient also notes to be on Seroquel  as needed for sleep this was discontinued given patient's multiple QT prolonging medications and concern for polypharmacy* Gastritis/GERD, well-controlled on PPI Insomnia, well-controlled  Discharge Instructions  Discharge Instructions     Call MD for:   Complete by: As directed    Worsening hyperglycemia or symptomatic hypoglycemia for further recommendations.   Call MD for:  difficulty breathing, headache or visual disturbances   Complete by: As directed    Call MD for:  extreme fatigue   Complete by: As directed    Call MD for:  hives   Complete by: As directed    Call MD for:  persistant dizziness or light-headedness   Complete by: As directed    Call MD for:  persistant nausea and vomiting   Complete by: As directed    Call MD for:  severe uncontrolled pain   Complete by: As directed    Call MD for:  temperature >100.4   Complete by: As directed    Diet - low sodium heart healthy   Complete by: As directed    Diet Carb Modified   Complete by: As directed    Discharge instructions   Complete by: As directed    Recommend further discussion with PCP and endocrinology in regards to insulin  pump   Increase activity slowly   Complete by: As directed       Allergies as of 10/21/2024       Reactions   Aspirin Hives   Trulicity  [dulaglutide ] Nausea Only, Other (See Comments)   DC'd due to chronic gastric and abdominal pain with nausea   Oxycodone  Nausea And Vomiting        Medication List     STOP taking these medications  QUEtiapine  25 MG tablet Commonly known as: SEROquel        TAKE these medications    Accu-Chek Guide test strip Generic drug: glucose blood Use to check blood sugar 3 times daily   Accu-Chek Guide w/Device Kit Use to check blood sugar 3 times daily.   Accu-Chek Softclix Lancets lancets Use  to check blood sugar 3 times daily   Aimovig  140 MG/ML Soaj Generic drug: Erenumab -aooe Inject 140 mg into the skin every 28 (twenty-eight) days.   albuterol  108 (90 Base) MCG/ACT inhaler Commonly known as: VENTOLIN  HFA Inhale 2 puffs into the lungs every 6 (six) hours as needed for wheezing or shortness of breath (Cough).   amLODipine  10 MG tablet Commonly known as: NORVASC  Take 1 tablet (10 mg total) by mouth daily.   atorvastatin  40 MG tablet Commonly known as: LIPITOR Take 1 tablet (40 mg total) by mouth daily.   Baqsimi  Two Pack 3 MG/DOSE Powd Generic drug: Glucagon  Place 1 Device into the nose as needed (Low blood sugar with impaired consciousness).   Blood Pressure Cuff Misc 1 each by Does not apply route daily. Please provide Large cuff   busPIRone  30 MG tablet Commonly known as: BUSPAR  Take 1 tablet (30 mg total) by mouth 2 (two) times daily. What changed: when to take this   cycloSPORINE  0.05 % ophthalmic emulsion Commonly known as: Restasis  Apply 1 drop into both eyes twice a day   Dexcom G7 Sensor Misc Change sensor every 10 days.   diclofenac  50 MG EC tablet Commonly known as: VOLTAREN  Take 50 mg by mouth 2 (two) times daily as needed for mild pain (pain score 1-3).   DULoxetine  60 MG capsule Commonly known as: Cymbalta  Take 1 capsule (60 mg total) by mouth 2 (two) times daily. What changed:  how much to take when to take this   EYE VITAMINS PO Take 1 capsule by mouth in the morning and at bedtime.   ezetimibe  10 MG tablet Commonly known as: ZETIA  Take 1 tablet (10 mg total) by mouth daily.   gabapentin  300 MG capsule Commonly known as: NEURONTIN  Take 1 capsule (300 mg total) by mouth 3 (three) times daily.   HumuLIN  R U-500 KwikPen 500 UNIT/ML KwikPen Generic drug: insulin  regular human CONCENTRATED Inject 85-95 Units into the skin daily with breakfast AND 75-80 Units daily before lunch AND 65-70 Units daily before supper. Take 30 minutes  before meals. What changed: See the new instructions.   hydrOXYzine  50 MG tablet Commonly known as: ATARAX  Take 1 tablet (50 mg total) by mouth 3 (three) times daily. What changed: when to take this   loratadine  10 MG tablet Commonly known as: CLARITIN  Take 1 tablet (10 mg total) by mouth daily as needed for allergies. What changed: reasons to take this   Lybalvi  15-10 MG Tabs Generic drug: OLANZapine -Samidorphan Take 15 mg by mouth at bedtime. What changed:  how much to take when to take this   metoCLOPramide  5 MG tablet Commonly known as: Reglan  Take 1 tablet (5 mg total) by mouth every 8 (eight) hours as needed for nausea. Dose decrease   metoprolol  tartrate 25 MG tablet Commonly known as: LOPRESSOR  Take 1 tablet (25 mg total) by mouth 2 (two) times daily.   omeprazole  40 MG capsule Commonly known as: PRILOSEC TAKE 1 CAPSULE BY MOUTH ONCE DAILY 30 MINUTES  BEFORE BREAKFAST What changed: See the new instructions.   ondansetron  4 MG disintegrating tablet Commonly known as: ZOFRAN -ODT Take 1 tablet (4  mg total) by mouth every 8 (eight) hours as needed for nausea or vomiting. What changed: reasons to take this   Pen Needles 31G X 5 MM Misc Use to inject insulin  three times daily.   PRESCRIPTION MEDICATION CPAP- At bedtime and during any time of rest   sucralfate  1 g tablet Commonly known as: Carafate  Take 1 tablet (1 g total) by mouth 4 (four) times daily -  with meals and at bedtime for 7 days.   SUMAtriptan  100 MG tablet Commonly known as: IMITREX  TAKE 1 TABLET EARLIEST ONSET OF MIGRAINE. MAY REPEAT IN 2 HOURS IF HEADACHE PERSISTS OR RECURS. MAXIMUM 2 TABLETS IN 24 HOURS. What changed:  how much to take how to take this when to take this additional instructions   traZODone  150 MG tablet Commonly known as: DESYREL  Take 1 tablet (150 mg total) by mouth at bedtime as needed for sleep. What changed: when to take this   Tylenol  8 Hour Arthritis Pain 650 MG CR  tablet Generic drug: acetaminophen  Take 1,300 mg by mouth every 8 (eight) hours as needed for pain.   vitamin B-12 50 MCG tablet Commonly known as: CYANOCOBALAMIN Take 50 mcg by mouth daily.   Vitamin D3 10 MCG (400 UNIT) Caps Take 400 Units by mouth daily.        Allergies  Allergen Reactions   Aspirin Hives   Trulicity  [Dulaglutide ] Nausea Only and Other (See Comments)    DC'd due to chronic gastric and abdominal pain with nausea   Oxycodone  Nausea And Vomiting    Consultations: None  Procedures/Studies: CT ABDOMEN PELVIS W CONTRAST Result Date: 10/20/2024 CLINICAL DATA:  Abdominal pain. EXAM: CT ABDOMEN AND PELVIS WITH CONTRAST TECHNIQUE: Multidetector CT imaging of the abdomen and pelvis was performed using the standard protocol following bolus administration of intravenous contrast. RADIATION DOSE REDUCTION: This exam was performed according to the departmental dose-optimization program which includes automated exposure control, adjustment of the mA and/or kV according to patient size and/or use of iterative reconstruction technique. CONTRAST:  OMNIPAQUE  IOHEXOL  300 MG/ML  SOLN COMPARISON:  04/30/2024 FINDINGS: Lower chest: Heart is normal size.  Visualized lung bases are clear. Hepatobiliary: Mild diffuse low-attenuation of the liver compatible with a degree of steatosis. No focal liver mass. Gallbladder not visualized. Biliary tree is normal. Pancreas: Normal. Spleen: Normal. Adrenals/Urinary Tract: Adrenal glands are normal. Kidneys are normal in size without hydronephrosis or nephrolithiasis. Ureters and bladder are normal. Stomach/Bowel: Stomach and small bowel are normal. Appendix is normal. Colon is normal. Vascular/Lymphatic: Abdominal aorta is normal in caliber. Remaining vascular structures are unremarkable. No adenopathy. Reproductive: Status post hysterectomy. No adnexal masses. Other: No free fluid or focal inflammatory change. Musculoskeletal: No focal  abnormality. IMPRESSION: 1. No acute findings in the abdomen/pelvis. 2. Mild hepatic steatosis. Electronically Signed   By: Toribio Agreste M.D.   On: 10/20/2024 17:14   MM 3D DIAGNOSTIC MAMMOGRAM UNILATERAL LEFT BREAST Result Date: 10/08/2024 CLINICAL DATA:  Recall from screening for a possible left breast mass. EXAM: DIGITAL DIAGNOSTIC UNILATERAL LEFT MAMMOGRAM WITH TOMOSYNTHESIS AND CAD; ULTRASOUND LEFT BREAST LIMITED TECHNIQUE: Left digital diagnostic mammography and breast tomosynthesis was performed. The images were evaluated with computer-aided detection. ; Targeted ultrasound examination of the left breast was performed. COMPARISON:  Previous exam(s). ACR Breast Density Category a: The breasts are almost entirely fatty. FINDINGS: On spot compression imaging, the possible mass noted on the current screening exam persists as a small, oval, mostly circumscribed mass in the anterior upper outer  quadrant. It measures approximately 6 mm in long axis. Targeted right breast ultrasound is performed, showing a cluster of cysts at 1 o'clock, 4 cm the nipple, measuring 6 x 4 x 6 mm, consistent in size, shape and location to the mammographic mass. No solid mass or suspicious finding. IMPRESSION: 1. No evidence of breast malignancy. 2. Small benign cluster of cysts in the right breast accounts for the screening mammographic mass. RECOMMENDATION: Screening mammogram in one year.(Code:SM-B-01Y) I have discussed the findings and recommendations with the patient. If applicable, a reminder letter will be sent to the patient regarding the next appointment. BI-RADS CATEGORY  2: Benign. Electronically Signed   By: Alm Parkins M.D.   On: 10/08/2024 10:15   US  LIMITED ULTRASOUND INCLUDING AXILLA LEFT BREAST  Result Date: 10/08/2024 CLINICAL DATA:  Recall from screening for a possible left breast mass. EXAM: DIGITAL DIAGNOSTIC UNILATERAL LEFT MAMMOGRAM WITH TOMOSYNTHESIS AND CAD; ULTRASOUND LEFT BREAST LIMITED TECHNIQUE:  Left digital diagnostic mammography and breast tomosynthesis was performed. The images were evaluated with computer-aided detection. ; Targeted ultrasound examination of the left breast was performed. COMPARISON:  Previous exam(s). ACR Breast Density Category a: The breasts are almost entirely fatty. FINDINGS: On spot compression imaging, the possible mass noted on the current screening exam persists as a small, oval, mostly circumscribed mass in the anterior upper outer quadrant. It measures approximately 6 mm in long axis. Targeted right breast ultrasound is performed, showing a cluster of cysts at 1 o'clock, 4 cm the nipple, measuring 6 x 4 x 6 mm, consistent in size, shape and location to the mammographic mass. No solid mass or suspicious finding. IMPRESSION: 1. No evidence of breast malignancy. 2. Small benign cluster of cysts in the right breast accounts for the screening mammographic mass. RECOMMENDATION: Screening mammogram in one year.(Code:SM-B-01Y) I have discussed the findings and recommendations with the patient. If applicable, a reminder letter will be sent to the patient regarding the next appointment. BI-RADS CATEGORY  2: Benign. Electronically Signed   By: Alm Parkins M.D.   On: 10/08/2024 10:15   MM 3D SCREENING MAMMOGRAM BILATERAL BREAST Result Date: 09/28/2024 CLINICAL DATA:  Screening. EXAM: DIGITAL SCREENING BILATERAL MAMMOGRAM WITH TOMOSYNTHESIS AND CAD TECHNIQUE: Bilateral screening digital craniocaudal and mediolateral oblique mammograms were obtained. Bilateral screening digital breast tomosynthesis was performed. The images were evaluated with computer-aided detection. COMPARISON:  Previous exam(s). ACR Breast Density Category a: The breasts are almost entirely fatty. FINDINGS: In the left breast, a possible mass warrants further evaluation. In the right breast, no findings suspicious for malignancy. IMPRESSION: Further evaluation is suggested for a possible mass in the left breast.  RECOMMENDATION: Diagnostic mammogram and possibly ultrasound of the left breast. (Code:[id number]) The patient will be contacted regarding the findings, and additional imaging will be scheduled. BI-RADS CATEGORY  0: Incomplete: Need additional imaging evaluation. Electronically Signed   By: Curtistine Noble   On: 09/28/2024 13:43     Subjective: No acute issues or events overnight/early this morning, tolerating p.o. well at this time denies further episodes of nausea vomiting   Discharge Exam: Vitals:   10/21/24 1300 10/21/24 1413  BP:  119/76  Pulse: 87   Resp: 15   Temp:    SpO2: 91%    Vitals:   10/21/24 1150 10/21/24 1200 10/21/24 1300 10/21/24 1413  BP:  112/62  119/76  Pulse:  81 87   Resp:  19 15   Temp: (!) 97.5 F (36.4 C)     TempSrc: Oral  SpO2:  93% 91%   Weight:      Height:        General: Pt is alert, awake, not in acute distress Cardiovascular: RRR, S1/S2 +, no rubs, no gallops Respiratory: CTA bilaterally, no wheezing, no rhonchi Abdominal: Soft, obese,, bowel sounds + Extremities: no edema, no cyanosis    The results of significant diagnostics from this hospitalization (including imaging, microbiology, ancillary and laboratory) are listed below for reference.     Microbiology: Recent Results (from the past 240 hours)  MRSA Next Gen by PCR, Nasal     Status: None   Collection Time: 10/20/24  9:37 PM   Specimen: Nasal Mucosa; Nasal Swab  Result Value Ref Range Status   MRSA by PCR Next Gen NOT DETECTED NOT DETECTED Final    Comment: (NOTE) The GeneXpert MRSA Assay (FDA approved for NASAL specimens only), is one component of a comprehensive MRSA colonization surveillance program. It is not intended to diagnose MRSA infection nor to guide or monitor treatment for MRSA infections. Test performance is not FDA approved in patients less than 6 years old. Performed at [facility], [address], [place], [place] [address]       Labs: BNP (last 3 results) No results for input(s): BNP in the last 8760 hours. Basic Metabolic Panel: Recent Labs  Lab 10/20/24 1303 10/20/24 2123 10/21/24 0031 10/21/24 0729 10/21/24 1224  NA 137 141 142 143 140  K 4.6 4.2 4.3 4.0 3.7  CL 96* 103 105 105 104  CO2 22 23 27 28 24   GLUCOSE 673* 344* 163* 166* 204*  BUN 12 12 11 12 12   CREATININE 1.04* 0.97 0.92 1.02* 0.95  CALCIUM  9.7 10.2 9.7 9.2 9.3   Liver Function Tests: Recent Labs  Lab 10/19/24 0856 10/20/24 1303  AST 28 29  ALT 22 24  ALKPHOS 113 127*  BILITOT 0.6 0.7  PROT 8.0 8.3*  ALBUMIN 4.5 4.5   Recent Labs  Lab 10/19/24 0856 10/20/24 1303  LIPASE 23 28   No results for input(s): AMMONIA in the last 168 hours. CBC: Recent Labs  Lab 10/19/24 0856 10/20/24 1303 10/21/24 0031  WBC 8.4 10.0 9.5  NEUTROABS 4.8  --   --   HGB 12.8 12.5 11.6*  HCT 42.3 41.3 38.0  MCV 84.3 85.5 85.8  PLT 526* 492* 475*   Cardiac Enzymes: No results for input(s): CKTOTAL, CKMB, CKMBINDEX, TROPONINI in the last 168 hours. BNP: Invalid input(s): POCBNP CBG: Recent Labs  Lab 10/21/24 0744 10/21/24 0903 10/21/24 1005 10/21/24 1140 10/21/24 1350  GLUCAP 159* 194* 321* 225* 180*   D-Dimer No results for input(s): DDIMER in the last 72 hours. Hgb A1c No results for input(s): HGBA1C in the last 72 hours. Lipid Profile No results for input(s): CHOL, HDL, LDLCALC, TRIG, CHOLHDL, LDLDIRECT in the last 72 hours. Thyroid  function studies No results for input(s): TSH, T4TOTAL, T3FREE, THYROIDAB in the last 72 hours.  Invalid input(s): FREET3 Anemia work up No results for input(s): VITAMINB12, FOLATE, FERRITIN, TIBC, IRON, RETICCTPCT in the last 72 hours. Urinalysis    Component Value Date/Time   COLORURINE COLORLESS (A) 10/20/2024 1603   APPEARANCEUR CLEAR 10/20/2024 1603   LABSPEC 1.027 10/20/2024 1603   PHURINE 6.0 10/20/2024 1603   GLUCOSEU >=500 (A)  10/20/2024 1603   HGBUR NEGATIVE 10/20/2024 1603   BILIRUBINUR NEGATIVE 10/20/2024 1603   BILIRUBINUR negative 06/08/2020 0928   KETONESUR 80 (A) 10/20/2024 1603   PROTEINUR NEGATIVE 10/20/2024 1603  UROBILINOGEN 1.0 01/17/2023 2050   NITRITE NEGATIVE 10/20/2024 1603   LEUKOCYTESUR NEGATIVE 10/20/2024 1603   Sepsis Labs Recent Labs  Lab 10/19/24 0856 10/20/24 1303 10/21/24 0031  WBC 8.4 10.0 9.5   Microbiology Recent Results (from the past 240 hours)  MRSA Next Gen by PCR, Nasal     Status: None   Collection Time: 10/20/24  9:37 PM   Specimen: Nasal Mucosa; Nasal Swab  Result Value Ref Range Status   MRSA by PCR Next Gen NOT DETECTED NOT DETECTED Final    Comment: (NOTE) The GeneXpert MRSA Assay (FDA approved for NASAL specimens only), is one component of a comprehensive MRSA colonization surveillance program. It is not intended to diagnose MRSA infection nor to guide or monitor treatment for MRSA infections. Test performance is not FDA approved in patients less than 19 years old. Performed at [facility], [address], [place], [place] [address]      Time coordinating discharge: Over 30 minutes  SIGNED:   Elsie JAYSON Montclair, DO [facility] 10/21/2024, 2:54 PM Pager   If 7PM-7AM, please contact night-coverage [email or web address]

## 2024-10-22 NOTE — Progress Notes (Signed)
 Client was driving and unable to keep the appointment. Client was rescheduled.

## 2024-10-25 ENCOUNTER — Encounter: Payer: Self-pay | Admitting: Radiology

## 2024-10-26 ENCOUNTER — Telehealth: Payer: Self-pay

## 2024-10-26 NOTE — Transitions of Care (Post Inpatient/ED Visit) (Signed)
   10/26/2024  Name: Whitney Benton MRN: [id number] DOB: October 18, 1971  Today's TOC FU Call Status: Today's TOC FU Call Status:: Successful TOC FU Call Completed TOC FU Call Complete Date: 10/26/24 Patient's Name and Date of Birth confirmed.  Transition Care Management Follow-up Telephone Call Date of Discharge: 10/21/24 Discharge Facility: [facility]) Type of Discharge: Inpatient Admission Primary Inpatient Discharge Diagnosis:: hyperglycemia How have you been since you were released from the hospital?: Better Any questions or concerns?: No  Items Reviewed: Did you receive and understand the discharge instructions provided?: Yes Medications obtained,verified, and reconciled?: Partial Review Completed Reason for Partial Mediation Review: She said she has all of the same medications and did not have any questions about the medications and did not need to review the med list. She did clarify how she is taking the Humulin  R U-500: she said she takes 135 units in before breakfast and 80-85 units at dinner. Any new allergies since your discharge?: No Dietary orders reviewed?: Yes Type of Diet Ordered:: low sodium, heart healthy, carb modified Do you have support at home?: Yes People in Home [RPT]: child(ren), adult Name of Support/Comfort Primary Source: She said she is currently staying with her son  Medications Reviewed Today: Medications Reviewed Today   Medications were not reviewed in this encounter     Home Care and Equipment/Supplies: Were Home Health Services Ordered?: No Any new equipment or medical supplies ordered?: No  Functional Questionnaire: Do you need assistance with bathing/showering or dressing?: No Do you need assistance with meal preparation?: No Do you need assistance with eating?: No Do you have difficulty maintaining continence: No Do you need assistance with getting out of bed/getting out of a chair/moving?: No Do you have difficulty managing or taking  your medications?: No (She said she has a Dexcom CGM)  Follow up appointments reviewed: PCP Follow-up appointment confirmed?: No (I told her that I would call her back with an appointment with Dr Vicci.  She was in agreement to seeing another provider if Dr Vicci does not have any appointments available in the upcoming weeks.) MD Provider Line Number:[phone] Given: No Specialist Hospital Follow-up appointment confirmed?: Yes Date of Specialist follow-up appointment?: 11/04/24 Follow-Up Specialty Provider:: pulmonary.   11/29/2024- endocrinology Do you need transportation to your follow-up appointment?: No Do you understand care options if your condition(s) worsen?: Yes-patient verbalized understanding    SIGNATURE Slater Diesel, RN

## 2024-10-27 ENCOUNTER — Other Ambulatory Visit: Payer: Self-pay

## 2024-10-27 NOTE — Telephone Encounter (Signed)
 I called the patient and explained that have a hospital follow up appointment available with Dr Tanda at [facility] on 11/10/2024.  The patient said that would be fine and she said she knows where that clinic is and she can get the appointment information from her MyChart.

## 2024-10-28 ENCOUNTER — Other Ambulatory Visit: Payer: Self-pay

## 2024-10-28 ENCOUNTER — Telehealth: Payer: Self-pay

## 2024-10-28 NOTE — Patient Outreach (Signed)
 Social Drivers of Health  Community Resource and Care Coordination Visit Note   10/28/2024  Name: DOMINI VANDEHEI MRN: [id number] DOB:October 04, 1971  Situation: Referral received for [facility] needs assessment and assistance related to Housing  Financial [employer] Insecurity  utility assistance. I obtained verbal consent from Patient.  Visit completed with Patient on the phone.   Background:   SDOH Interventions Today    Flowsheet Row Most Recent Value  SDOH Interventions   Food Insecurity Interventions Community Resources Provided  [BSW will provide food resources via email for out of the garden food project.]  Housing Interventions --  [housing resources provided.]  Transportation Interventions Intervention Not Indicated  [patient has personal transportation.]  Utilities Interventions Community Resources Provided  [utility resources provided.]  Financial Strain Interventions Community Resources Provided     Assessment:   Goals Addressed             This Visit's Progress    BSW Goal       .Current SDOH Barriers:  Limited access to food Housing insecurity Utility assistance  Interventions: Patient interviewed and appropriate screenings performed Referred patient to community resources  Provided patient with information about out of the garden food project, utility assistance programs, and housing resources in [employer]  Discussed plans with patient for ongoing follow up and provided patient with direct contact number Provided patient with food resources for out of the garden and local food pantry/bank resources in [employer]           Recommendation:   attend all scheduled provider appointments call for transportation assistance at least one week before appointments ask for help if you don't understand your health insurance benefits call and/or follow up with out-of-the-garden food project for food assistance  Follow Up Plan:   Telephone follow up appointment  date/time:  11/11/2024 at 11AM  Laymon Doll, [place] Social Worker [phone]

## 2024-10-28 NOTE — Patient Instructions (Signed)
 Visit Information  Ms. Shisler was given information about Medicaid Managed Care team care coordination services as a part of their [facility] Community Plan Medicaid benefit.   If you would like to schedule transportation through your [facility], please call the following number at least 2 days in advance of your appointment: [phone]   Rides for urgent appointments can also be made after hours by calling Member Services.  Call the Behavioral Health Crisis Line at [phone], at any time, 24 hours a day, 7 days a week. If you are in danger or need immediate medical attention call 911.  Please see education materials related to food, housing, and utility resources provided by email.  Care plan and visit instructions communicated with the patient verbally today. Patient agrees to receive a copy in MyChart. Active MyChart status and patient understanding of how to access instructions and care plan via MyChart confirmed with patient.     Telephone follow up appointment with Managed Medicaid care management team member scheduled for: 11/11/2024 at 11AM.  Laymon Doll, BSW [place]/VBCI - [facility] Social Worker [phone]   Following is a copy of your plan of care:  There are no care plans that you recently modified to display for this patient.

## 2024-11-01 ENCOUNTER — Other Ambulatory Visit: Payer: Self-pay | Admitting: Nurse Practitioner

## 2024-11-03 ENCOUNTER — Other Ambulatory Visit: Payer: Self-pay | Admitting: *Deleted

## 2024-11-03 ENCOUNTER — Other Ambulatory Visit: Payer: Self-pay

## 2024-11-03 NOTE — Patient Instructions (Signed)
 Visit Information  Ms. Fulgham was given information about Medicaid Managed Care team care coordination services as a part of their [facility] Community Plan Medicaid benefit.   If you would like to schedule transportation through your [facility], please call the following number at least 2 days in advance of your appointment: [phone]   Rides for urgent appointments can also be made after hours by calling Member Services.  Call the Behavioral Health Crisis Line at [phone], at any time, 24 hours a day, 7 days a week. If you are in danger or need immediate medical attention call 911.  Please see education materials related to Diabetes and Asthma provided by MyChart link.  Care plan and visit instructions communicated with the patient verbally today. Patient agrees to receive a copy in MyChart. Active MyChart status and patient understanding of how to access instructions and care plan via MyChart confirmed with patient.     Telephone follow up appointment with Managed Medicaid care management team member scheduled for: If patient returns call to provider office, please advise to call Managed Medicaid Care Guide at [phone].  Telephone follow appointment with Managed Medicaid care management team member scheduled for 12/08/24 @ 11:45 am.  Davit Vassar, RN, BSN, ACM RN Care Manager [employer] [phone]

## 2024-11-03 NOTE — Patient Outreach (Signed)
 Complex Care Management   Visit Note  11/03/2024  Name:  Whitney Benton MRN: [id number] DOB: 07-30-1971  Situation: Referral received for Complex Care Management related to SDOH Barriers:  Housing   Food insecurity Lack of essential utilities   and Asthma and Diabetes I obtained verbal consent from Patient.  Visit completed with Patient  on the phone  Background:   Past Medical History:  Diagnosis Date   Allergy    Anxiety    Arthritis    Asthma    Depression    Diabetes mellitus without complication (HCC)    GERD (gastroesophageal reflux disease)    Glaucoma suspect    Hyperlipidemia    Trichomonas infection     Assessment: Patient Reported Symptoms:  Cognitive Cognitive Status: Able to follow simple commands, Alert and oriented to person, place, and time, Insightful and able to interpret abstract concepts, Normal speech and language skills Cognitive/Intellectual Conditions Management [RPT]: None reported or documented in medical history or problem list   Health Maintenance Behaviors: Annual physical exam, Sleep adequate, Healthy diet, Stress management Healing Pattern: Average Health Facilitated by: Prayer/meditation, Rest, Healthy diet  Neurological Neurological Review of Symptoms: No symptoms reported Neurological Management Strategies: Adequate rest, Medication therapy, Routine screening Neurological Self-Management Outcome: 4 (good) Neurological Comment: Patient reports that she has chronic migraines.  She reports that she has not had  bothered with any migraines lately.  She reports that she uses Imitrex  as needed and receives a shot Aimovig  for chronic migraines.  HEENT HEENT Symptoms Reported: No symptoms reported HEENT Management Strategies: Medication therapy, Routine screening HEENT Self-Management Outcome: 3 (uncertain)    Cardiovascular Cardiovascular Symptoms Reported: No symptoms reported Does patient have uncontrolled Hypertension?: No Cardiovascular  Management Strategies: Adequate rest, Routine screening, Coping strategies Cardiovascular Self-Management Outcome: 4 (good) Cardiovascular Comment: Patient reports no no recent bloos pressure readings.  Patient encourgated to take BP once daily and log readings.  Respiratory Respiratory Symptoms Reported: Shortness of breath Other Respiratory Symptoms: Patient reports occassional shortness of breath and wheezing.  Patient reports that she uses albuterol  inhaler to assist with shortness of breath.  She reports that she continues to use CPAP at night. Respiratory Management Strategies: Asthma action plan, CPAP, Routine screening, Coping strategies Respiratory Self-Management Outcome: 3 (uncertain)  Endocrine Endocrine Symptoms Reported: No symptoms reported Is patient diabetic?: Yes Is patient checking blood sugars at home?: Yes List most recent blood sugar readings, include date and time of day: Patient reports that she has Dexcome.  She reports that her Blood Sugar ranged from 140-200. Endocrine Self-Management Outcome: 3 (uncertain)  Gastrointestinal   Gastrointestinal Management Strategies: Activity, Adequate rest, Medication therapy Gastrointestinal Self-Management Outcome: 4 (good)    Genitourinary Genitourinary Symptoms Reported: No symptoms reported Genitourinary Management Strategies: Adequate rest Genitourinary Self-Management Outcome: 4 (good)  Integumentary Integumentary Symptoms Reported: No symptoms reported Skin Management Strategies: Adequate rest, Routine screening Skin Self-Management Outcome: 4 (good)  Musculoskeletal   Musculoskeletal Management Strategies: Routine screening, Medication therapy Musculoskeletal Self-Management Outcome: 3 (uncertain) Falls in the past year?: No Number of falls in past year: 1 or less Was there an injury with Fall?: No Fall Risk Category Calculator: 0 Patient Fall Risk Level: Low Fall Risk Patient at Risk for Falls Due to: No Fall Risks   Psychosocial Psychosocial Symptoms Reported: Depression - if selected complete PHQ 2-9, Anxiety - if selected complete GAD Additional Psychological Details: Patient reports recent hospitalization.  She reports that her anxiety has been up.  She rpeorts that she  takes Cymbalta  and Buspar  to help with Anxiety and depression.  She is also followed by [facility]. Behavioral Management Strategies: Activity, Coping strategies, Support system, Adequate rest, Medication therapy Behavioral Health Self-Management Outcome: 3 (uncertain) Major Change/Loss/Stressor/Fears (CP): Medical condition, self Behaviors When Feeling Stressed/Fearful: Recent hospitalization Techniques to Cope with Loss/Stress/Change: Counseling, Diversional activities, Medication Quality of Family Relationships: helpful, involved, supportive Do you feel physically threatened by others?: No    11/03/2024    PHQ2-9 Depression Screening   Little interest or pleasure in doing things Nearly every day  Feeling down, depressed, or hopeless Nearly every day  PHQ-2 - Total Score 6  Trouble falling or staying asleep, or sleeping too much Nearly every day  Feeling tired or having little energy Nearly every day  Poor appetite or overeating  Nearly every day  Feeling bad about yourself - or that you are a failure or have let yourself or your family down More than half the days  Trouble concentrating on things, such as reading the newspaper or watching television Nearly every day  Moving or speaking so slowly that other people could have noticed.  Or the opposite - being so fidgety or restless that you have been moving around a lot more than usual Nearly every day  Thoughts that you would be better off dead, or hurting yourself in some way Not at all  PHQ2-9 Total Score 23  If you checked off any problems, how difficult have these problems made it for you to do your work, take care of things at home, or get along with other people Somewhat difficult   Depression Interventions/Treatment Currently on Treatment, Medication, Counseling    There were no vitals filed for this visit. Pain Scale: 0-10 Pain Score: 0-No pain  Medications Reviewed Today     Reviewed by Jorja Nichole LABOR, RN (Case Manager) on 11/03/24 at 1118  Med List Status: <None>   Medication Order Taking? Sig Documenting Provider Last Dose Status Informant  Accu-Chek Softclix Lancets lancets [id number]  Use to check blood sugar 3 times daily [name], MD  Active   albuterol  (VENTOLIN  HFA) 108 (90 Base) MCG/ACT inhaler [id number] Yes Inhale 2 puffs into the lungs every 6 (six) hours as needed for wheezing or shortness of breath (Cough). [name], MD  Active Self  amLODipine  (NORVASC ) 10 MG tablet [id number] Yes Take 1 tablet (10 mg total) by mouth daily. [name], MD  Active Self  atorvastatin  (LIPITOR) 40 MG tablet [id number] Yes Take 1 tablet (40 mg total) by mouth daily. Dartha Ernst, MD  Active Self  Blood Glucose Monitoring Suppl (ACCU-CHEK GUIDE) w/Device KIT [id number]  Use to check blood sugar 3 times daily. [name], MD  Active   Blood Pressure Monitoring (BLOOD PRESSURE CUFF) MISC [id number] Yes 1 each by Does not apply route daily. Please provide Large cuff Wyn Jackee VEAR Mickey., NP  Active   busPIRone  (BUSPAR ) 30 MG tablet [id number] Yes Take 1 tablet (30 mg total) by mouth 2 (two) times daily. Harl Zane BRAVO, NP  Active Self  Cholecalciferol  (VITAMIN D3) 10 MCG (400 UNIT) CAPS [id number] Yes Take 400 Units by mouth daily. [provider]  Active Self  Continuous Glucose Sensor (DEXCOM G7 SENSOR) MISC [id number] Yes Change sensor every 10 days. Motwani, Komal, MD  Active Self  cycloSPORINE  (RESTASIS ) 0.05 % ophthalmic emulsion [id number] Yes Apply 1 drop into both eyes twice a day   Active Self  diclofenac  (VOLTAREN ) 50 MG EC tablet  [id number] Yes Take 50 mg by mouth 2 (two) times daily as needed for mild pain (pain score 1-3).  [provider]  Active Self  DULoxetine  (CYMBALTA ) 60 MG capsule [id number] Yes Take 1 capsule (60 mg total) by mouth 2 (two) times daily. Harl Zane BRAVO, NP  Active Self  Erenumab -aooe (AIMOVIG ) 140 MG/ML SOAJ [id number] Yes Inject 140 mg into the skin every 28 (twenty-eight) days. Skeet Juliene SAUNDERS, DO  Active Self  ezetimibe  (ZETIA ) 10 MG tablet [id number] Yes Take 1 tablet (10 mg total) by mouth daily. Tobb, Kardie, DO  Active Self  gabapentin  (NEURONTIN ) 300 MG capsule [id number] Yes Take 1 capsule (300 mg total) by mouth 3 (three) times daily. Harl Zane BRAVO, NP  Active Self  Glucagon  (BAQSIMI  ONE PACK) 3 MG/DOSE POWD [id number]  Place 1 Device into the nose as needed (Low blood sugar with impaired consciousness). Dartha Ernst, MD  Active Self           Med Note MARISA, NATHANEL SAILOR   Wed Oct 20, 2024  8:20 PM)    glucose blood (ACCU-CHEK GUIDE) test strip [id number]  Use to check blood sugar 3 times daily [name], MD  Active Self  hydrOXYzine  (ATARAX ) 50 MG tablet [id number] Yes Take 1 tablet (50 mg total) by mouth 3 (three) times daily. Harl Zane BRAVO, NP  Active Self  Insulin  Pen Needle (PEN NEEDLES) 31G X 5 MM MISC [id number]  Use to inject insulin  three times daily. [name], MD  Active   insulin  regular human CONCENTRATED (HUMULIN  R U-500 KWIKPEN) 500 UNIT/ML KwikPen [id number] Yes Inject 85-95 Units into the skin daily with breakfast AND 75-80 Units daily before lunch AND 65-70 Units daily before supper. Take 30 minutes before meals. Motwani, Komal, MD  Active Self           Med Note MARISA, NATHANEL SAILOR [place] Oct 20, 2024  8:47 PM) I asked the patient several times about this. She said she uses it 2 (TWO) times a day.  loratadine  (CLARITIN ) 10 MG tablet [id number] Yes Take 1 tablet (10 mg total) by mouth daily as needed for allergies. [name], MD  Active Self  metoCLOPramide  (REGLAN ) 5 MG tablet [id number] Yes Take 1 tablet (5 mg total) by mouth every 8  (eight) hours as needed for nausea. Dose decrease [name], MD  Active Self  metoprolol  tartrate (LOPRESSOR ) 25 MG tablet [id number] Yes Take 1 tablet (25 mg total) by mouth 2 (two) times daily. Wyn Jackee VEAR Mickey., NP  Active Self  Multiple Vitamins-Minerals (EYE VITAMINS PO) [id number] Yes Take 1 capsule by mouth in the morning and at bedtime. [provider]  Active Self  OLANZapine -Samidorphan (LYBALVI ) 15-10 MG TABS [id number] Yes Take 15 mg by mouth at bedtime. Harl Zane BRAVO, NP  Active Self  omeprazole  (PRILOSEC) 40 MG capsule [id number] Yes TAKE 1 CAPSULE BY MOUTH ONCE DAILY 30 MINUTES BEFORE BREAKFAST Kennedy-Smith, Colleen M, NP  Active   ondansetron  (ZOFRAN -ODT) 4 MG disintegrating tablet [id number]  Take 1 tablet (4 mg total) by mouth every 8 (eight) hours as needed for nausea or vomiting.  Patient taking differently: Take 4 mg by mouth every 8 (eight) hours as needed for nausea or vomiting (dissolve orally).   [name], MD  Active Self  PRESCRIPTION MEDICATION [id number]  CPAP- At bedtime and during any time of rest [provider]  Active Self  sucralfate  (CARAFATE ) 1 g tablet [id number]  Take  1 tablet (1 g total) by mouth 4 (four) times daily -  with meals and at bedtime for 7 days.  Patient not taking: Reported on 10/20/2024   Gennaro Duwaine CROME, DO  Expired 10/26/24 2359 Self  SUMAtriptan  (IMITREX ) 100 MG tablet [id number] Yes TAKE 1 TABLET EARLIEST ONSET OF MIGRAINE. MAY REPEAT IN 2 HOURS IF HEADACHE PERSISTS OR RECURS. MAXIMUM 2 TABLETS IN 24 HOURS. Skeet Juliene SAUNDERS, DO  Active Self  traZODone  (DESYREL ) 150 MG tablet [id number] Yes Take 1 tablet (150 mg total) by mouth at bedtime as needed for sleep. Harl Zane BRAVO, NP  Active Self  TYLENOL  8 HOUR ARTHRITIS PAIN 650 MG CR tablet [id number] Yes Take 1,300 mg by mouth every 8 (eight) hours as needed for pain. [provider]  Active Self  vitamin B-12 (CYANOCOBALAMIN) 50 MCG tablet [id number]  Yes Take 50 mcg by mouth daily. [provider]  Active Self            Recommendation:   PCP Follow-up Specialty provider follow-up : Pulmonary-11/04/24; [facility]-11/11/24;  Continue Current Plan of Care  Follow Up Plan:   Telephone follow-up in 1 month: 12/08/24 @ 11:45 am.  Butch Otterson, RN, BSN, ACM RN Care Manager [facility] [phone]

## 2024-11-04 ENCOUNTER — Encounter: Payer: Self-pay | Admitting: Internal Medicine

## 2024-11-04 ENCOUNTER — Ambulatory Visit: Admitting: Internal Medicine

## 2024-11-04 ENCOUNTER — Ambulatory Visit: Admitting: Primary Care

## 2024-11-04 VITALS — BP 113/80 | HR 119 | Temp 98.5°F | Ht 64.0 in | Wt 251.0 lb

## 2024-11-04 DIAGNOSIS — G4733 Obstructive sleep apnea (adult) (pediatric): Secondary | ICD-10-CM

## 2024-11-04 NOTE — Patient Instructions (Signed)
 It was a pleasure to see you today!  Please schedule follow up with Dr Theodoro in 3 months. Please call sooner [phone] if issues or concerns arise. You can also send us  a message through MyChart, but but aware that this is not to be used for urgent issues and it may take up to 5-7 days to receive a reply. Please be aware that you will likely be able to view your results before I have a chance to respond to them. Please give us  5 business days to respond to any non-urgent results.     I reviewed your sleep download today. It is working on the days you are using it. Continue the current settings but change to the larger face mask so you have less leak. Remember to wear it before you fall asleep.

## 2024-11-04 NOTE — Progress Notes (Signed)
 [name]    [id number]    1971/07/31  Primary Care Physician:Johnson, Barnie NOVAK, MD Date of Appointment: 11/04/2024 Established Patient Visit  Chief complaint:   Chief Complaint  Patient presents with   Asthma   Obstructive Sleep Apnea    Follow up     HPI: Whitney Benton is a 53 y.o. woman with possible asthma and mild OSA with excessive daytime sleepiness and fatigue.   Interval Updates: Here for follow up after trial of CPAP therapy.   CPAP download reviewed - 60% adherence auto cpap 5-15 AHI 1.1 substantial leak.  Median pressure 7.1  She had a sleep study which shwos mild OSA AHI 5/hour Feels a little bit better with dulera, but not dramatically. Hasn't had to use albuterol  rescue inhaler.   She has a medium face mask and feels she is having leak. She has a larger face mask and doesn't know how to put it on.   She falls asleep and forgets to put it on.  She also was sick and didn't wear it for about two weeks because she was sick and in the hospital. She was hospitalized for DKA and gastroparesis.   She feels like there is too much moisture in her nose.   She stopped all inhalers and doesn't feel her breathing is any worse.   I have reviewed the patient's family social and past medical history and updated as appropriate.   Past Medical History:  Diagnosis Date   Allergy    Anxiety    Arthritis    Asthma    Depression    Diabetes mellitus without complication (HCC)    GERD (gastroesophageal reflux disease)    Glaucoma suspect    Hyperlipidemia    Trichomonas infection     Past Surgical History:  Procedure Laterality Date   CESAREAN SECTION     UPPER GASTROINTESTINAL ENDOSCOPY     VAGINAL HYSTERECTOMY  12/23/2004   Fibroids, menorrhagia, benign pathology    Family History  Problem Relation Age of Onset   Diabetes Mother    Mental illness Mother    Depression Mother    Hypertension Mother    Colon cancer Father 94   Hypertension  Father    Diabetes Father    Colon cancer Paternal Grandmother    Esophageal cancer Neg Hx    Stomach cancer Neg Hx    Rectal cancer Neg Hx    Asthma Neg Hx    Breast cancer Neg Hx     Social History   Occupational History   Not on file  Tobacco Use   Smoking status: Former    Current packs/day: 0.00    Average packs/day: 0.5 packs/day for 13.0 years (6.5 ttl pk-yrs)    Types: Cigarettes    Start date: 2008    Quit date: 2021    Years since quitting: 4.8    Passive exposure: Past   Smokeless tobacco: Never  Vaping Use   Vaping status: Never Used  Substance and Sexual Activity   Alcohol use: Not Currently    Comment: occasional   Drug use: Yes    Types: Marijuana    Comment: occ   Sexual activity: Yes    Birth control/protection: Surgical     Physical Exam: Blood pressure 113/80, pulse (!) 119, temperature 98.5 F (36.9 C), temperature source Oral, height 5' 4 (1.626 m), weight 251 lb (113.9 kg), SpO2 96%.  Gen:  No distress, obese ENT:  mallampati IV Lungs:    diminished no wheezes or crackles, no increased work of breathing CV:        RRR no edema   Data Reviewed: Imaging: I have personally reviewed the chest xray June 2024 - no acute cardiopulmonary process  PFTs:     Latest Ref Rng & Units 08/12/2024    7:58 AM  PFT Results  FVC-Pre L 2.12   FVC-Predicted Pre % 62   FVC-Post L 2.10   FVC-Predicted Post % 61   Pre FEV1/FVC % % 80   Post FEV1/FCV % % 82   FEV1-Pre L 1.71   FEV1-Predicted Pre % 63   FEV1-Post L 1.72   DLCO uncorrected ml/min/mmHg 15.45   DLCO UNC% % 75   DLCO corrected ml/min/mmHg 15.45   DLCO COR %Predicted % 75   DLVA Predicted % 99   TLC L 3.96   TLC % Predicted % 79   RV % Predicted % 92    I have personally reviewed the patient's PFTs and normal pulmonary function  Labs: Lab Results  Component Value Date   WBC 9.5 10/21/2024   HGB 11.6 (L) 10/21/2024   HCT 38.0 10/21/2024   MCV 85.8 10/21/2024   PLT 475 (H)  10/21/2024   Lab Results  Component Value Date   NA 140 10/21/2024   K 3.7 10/21/2024   CO2 24 10/21/2024   GLUCOSE 204 (H) 10/21/2024   BUN 12 10/21/2024   CREATININE 0.95 10/21/2024   CALCIUM  9.3 10/21/2024   GFR 78.96 10/09/2022   EGFR 79 03/23/2024   GFRNONAA >60 10/21/2024    Immunization status: Immunization History  Administered Date(s) Administered   Tdap 07/07/2023    External Records Personally Reviewed: sleep study   Assessment:  OSA  Excessive daytime sleepiness and fatigue  Plan/Recommendations:  I reviewed your sleep download today. It is working on the days you are using it. Continue the current settings but change to the larger face mask so you have less leak. Remember to wear it before you fall asleep.    Return to Care: Return in about 3 months (around 02/04/2025) for Dr Theodoro.   Verdon Gore, MD Pulmonary and Critical Care Medicine [facility] Office:[phone]

## 2024-11-10 ENCOUNTER — Inpatient Hospital Stay: Admitting: Family Medicine

## 2024-11-11 ENCOUNTER — Other Ambulatory Visit: Payer: Self-pay | Admitting: "Endocrinology

## 2024-11-11 ENCOUNTER — Other Ambulatory Visit: Payer: Self-pay

## 2024-11-11 ENCOUNTER — Ambulatory Visit: Admitting: Orthopaedic Surgery

## 2024-11-11 ENCOUNTER — Ambulatory Visit (HOSPITAL_COMMUNITY): Admitting: Clinical

## 2024-11-11 DIAGNOSIS — G5602 Carpal tunnel syndrome, left upper limb: Secondary | ICD-10-CM

## 2024-11-11 DIAGNOSIS — F331 Major depressive disorder, recurrent, moderate: Secondary | ICD-10-CM

## 2024-11-11 DIAGNOSIS — E1065 Type 1 diabetes mellitus with hyperglycemia: Secondary | ICD-10-CM

## 2024-11-11 LAB — POCT GLYCOSYLATED HEMOGLOBIN (HGB A1C): Hemoglobin A1C: 8.9 % — AB (ref 4.0–5.6)

## 2024-11-11 MED ORDER — HUMULIN R U-500 KWIKPEN 500 UNIT/ML ~~LOC~~ SOPN
PEN_INJECTOR | SUBCUTANEOUS | 3 refills | Status: AC
  Filled 2024-11-11: qty 12, 25d supply, fill #0
  Filled 2024-12-03: qty 12, 25d supply, fill #1
  Filled 2024-12-31: qty 12, 25d supply, fill #2
  Filled 2025-01-25: qty 12, 25d supply, fill #3

## 2024-11-11 NOTE — Progress Notes (Signed)
 Office Visit Note   Patient: Whitney Benton           Date of Birth: 25-Jul-1971           MRN: [id number] Visit Date: 11/11/2024              Requested by: [name], MD [address] [place],  [place] [address] PCP: [name], MD   Assessment & Plan: Visit Diagnoses:  1. Left carpal tunnel syndrome     Plan: History of Present Illness Whitney Benton is a 53 year old female who presents for surgical consultation for bilateral carpal tunnel syndrome.  She has undergone nerve conduction studies in 2023 confirming bilateral carpal tunnel syndrome. She prefers to have surgery on her left hand first. Her hemoglobin A1c was 8 two months ago, and she is actively working on reducing it through lifestyle changes. She is not on any blood thinners.  Results LABS A1c: 8 (09/11/2024)  DIAGNOSTIC Nerve conduction studies: Moderate to severe bilateral carpal tunnel syndrome, worse on the left (2023)  Assessment and Plan Left carpal tunnel syndrome Moderate to severe bilateral carpal tunnel syndrome, worse on the left, confirmed by 2023 nerve conduction studies. She opted to address the left side first. - POC A1c level today was 8.9 - after further discussion and consideration, the patient has decided to postpone surgery until her A1c is better  Follow-Up Instructions: No follow-ups on file.   Orders:  No orders of the defined types were placed in this encounter.  No orders of the defined types were placed in this encounter.     Procedures: No procedures performed   Clinical Data: No additional findings.   Subjective: Chief Complaint  Patient presents with   Right Hand - Pain   Left Hand - Pain    HPI  Review of Systems  Constitutional: Negative.   HENT: Negative.    Eyes: Negative.   Respiratory: Negative.    Cardiovascular: Negative.   Endocrine: Negative.   Musculoskeletal: Negative.   Neurological: Negative.   Hematological:  Negative.   Psychiatric/Behavioral: Negative.    All other systems reviewed and are negative.    Objective: Vital Signs: There were no vitals taken for this visit.  Physical Exam Vitals and nursing note reviewed.  Constitutional:      Appearance: She is well-developed.  HENT:     Head: Atraumatic.     Nose: Nose normal.  Eyes:     Extraocular Movements: Extraocular movements intact.  Cardiovascular:     Pulses: Normal pulses.  Pulmonary:     Effort: Pulmonary effort is normal.  Abdominal:     Palpations: Abdomen is soft.  Musculoskeletal:     Cervical back: Neck supple.  Skin:    General: Skin is warm.     Capillary Refill: Capillary refill takes less than 2 seconds.  Neurological:     Mental Status: She is alert. Mental status is at baseline.  Psychiatric:        Behavior: Behavior normal.        Thought Content: Thought content normal.        Judgment: Judgment normal.     Ortho Exam  Specialty Comments:  No specialty comments available.  Imaging: No results found.   PMFS History: Patient Active Problem List   Diagnosis Date Noted   Hyperglycemia 10/20/2024   Gastroesophageal reflux disease 10/20/2024   Right carpal tunnel syndrome 07/30/2024   Left carpal tunnel  syndrome 07/30/2024   Type 2 diabetes mellitus with hyperglycemia, with long-term current use of insulin  (HCC) 04/30/2024   Intractable nausea and vomiting 12/23/2023   Diabetic gastroparesis (HCC) 12/22/2023   DKA, type 1 (HCC) 06/20/2023   Essential hypertension 06/20/2023   Vitamin D  deficiency 06/20/2023   Hyperlipidemia 03/06/2023   Primary insomnia 12/27/2022   Gallstone pancreatitis 12/03/2022   Chronic cholecystitis with calculus 12/03/2022   Morbid obesity (HCC) 04/04/2022   Former smoker 04/30/2021   Major depressive disorder, recurrent episode, moderate (HCC) 04/30/2021   Substance induced mood disorder (HCC) 03/22/2021   Generalized anxiety disorder 03/22/2021   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   DKA (diabetic ketoacidosis) (HCC) 01/23/2021   Glaucoma suspect 04/12/2020   DKA, type 2, not at goal [facility]) 10/02/2019   AKI (acute kidney injury) 10/02/2019   Hyperkalemia 10/02/2019   Leukocytosis 10/02/2019   Abnormal LFTs 10/02/2019   Stressful life events affecting family and household 08/06/2019   New onset type 2 diabetes mellitus (HCC) 02/04/2019   Morbid obesity with BMI of 40.0-44.9, adult (HCC) 02/04/2019   Tobacco dependence 02/04/2019   Left arm weakness 12/06/2014   Paresthesias/numbness 12/06/2014   Tobacco abuse 11/14/2014   Anxiety and depression    Mixed incontinence 06/27/2014   History of TVH in 2006 for fibroids and menorrhagia; benign pathology 09/08/2011   Past Medical History:  Diagnosis Date   Allergy    Anxiety    Arthritis    Asthma    Depression    Diabetes mellitus without complication (HCC)    GERD (gastroesophageal reflux disease)    Glaucoma suspect    Hyperlipidemia    Trichomonas infection     Family History  Problem Relation Age of Onset   Diabetes Mother    Mental illness Mother    Depression Mother    Hypertension Mother    Colon cancer Father 49   Hypertension Father    Diabetes Father    Colon cancer Paternal Grandmother    Esophageal cancer Neg Hx    Stomach cancer Neg Hx    Rectal cancer Neg Hx    Asthma Neg Hx    Breast cancer Neg Hx     Past Surgical History:  Procedure Laterality Date   CESAREAN SECTION     UPPER GASTROINTESTINAL ENDOSCOPY     VAGINAL HYSTERECTOMY  12/23/2004   Fibroids, menorrhagia, benign pathology   Social History   Occupational History   Not on file  Tobacco Use   Smoking status: Former    Current packs/day: 0.00    Average packs/day: 0.5 packs/day for 13.0 years (6.5 ttl pk-yrs)    Types: Cigarettes    Start date: 2008    Quit date: 2021    Years since quitting: 4.8    Passive exposure: Past   Smokeless tobacco: Never  Vaping Use    Vaping status: Never Used  Substance and Sexual Activity   Alcohol use: Not Currently    Comment: occasional   Drug use: Yes    Types: Marijuana    Comment: occ   Sexual activity: Yes    Birth control/protection: Surgical

## 2024-11-11 NOTE — Patient Instructions (Signed)
 Visit Information  [name] was given information about Medicaid Managed Care team care coordination services as a part of their [facility] Community Plan Medicaid benefit.   If you would like to schedule transportation through your [facility], please call the following number at least 2 days in advance of your appointment: [phone]   Rides for urgent appointments can also be made after hours by calling Member Services.  Call the Behavioral Health Crisis Line at [phone], at any time, 24 hours a day, 7 days a week. If you are in danger or need immediate medical attention call 911.   Patient verbalizes understanding of instructions and care plan provided today and agrees to view in MyChart. Active MyChart status and patient understanding of how to access instructions and care plan via MyChart confirmed with patient.     Telephone follow up appointment with Managed Medicaid care management team member scheduled for: 11/25/2024 at 11AM.  Whitney Benton, BSW [place]/VBCI - [facility] Social Worker [phone]   Following is a copy of your plan of care:  There are no care plans that you recently modified to display for this patient.

## 2024-11-11 NOTE — Patient Outreach (Signed)
 Social Drivers of Health  Community Resource and Care Coordination Visit Note   11/11/2024  Name: Whitney Benton MRN: [id number] DOB:1971/11/18  Situation: Referral received for [facility] needs assessment and assistance related to Housing  Financial [employer] Insecurity  utility assistance. I obtained verbal consent from Patient.  Visit completed with Patient on the phone.   Background:   SDOH Interventions Today    Flowsheet Row Most Recent Value  SDOH Interventions   Food Insecurity Interventions Community Resources Provided  [Patient received food resources via email and has been able ot access them.]  Housing Interventions Community Resources Provided  [BSW provided housing resources via email.]  Utilities Interventions Community Resources Provided  [BSW provided utility resources.]     Assessment:   Goals Addressed             This Visit's Progress    BSW Goal   On track    .Current SDOH Barriers:  Limited access to food Housing insecurity Utility assistance  Interventions: Patient interviewed and appropriate screenings performed Referred patient to community resources  Provided patient with information about out of the garden food project, utility assistance programs, and housing resources in [employer]  Discussed plans with patient for ongoing follow up and provided patient with direct contact number Provided patient with food resources for out of the garden and local food pantry/bank resources in [employer]           Recommendation:   attend all scheduled provider appointments call for transportation assistance at least one week before appointments ask for help if you don't understand your health insurance benefits call and/or follow up with out of the garden food project for food assistance  Follow Up Plan:   Telephone follow up appointment date/time:  11/25/2024 at 11AM.  Laymon Doll, BSW [place]/VBCI - [facility] Social  Worker [phone]

## 2024-11-11 NOTE — Telephone Encounter (Signed)
 Requested Prescriptions   Pending Prescriptions Disp Refills   insulin  regular human CONCENTRATED (HUMULIN  R U-500 KWIKPEN) 500 UNIT/ML KwikPen 12 mL 3    Sig: Inject 85-95 Units into the skin daily with breakfast AND 75-80 Units daily before lunch AND 65-70 Units daily before supper. Take 30 minutes before meals.

## 2024-11-12 ENCOUNTER — Other Ambulatory Visit: Payer: Self-pay

## 2024-11-15 ENCOUNTER — Other Ambulatory Visit: Payer: Self-pay

## 2024-11-24 ENCOUNTER — Ambulatory Visit (HOSPITAL_COMMUNITY): Admitting: Clinical

## 2024-11-24 DIAGNOSIS — F331 Major depressive disorder, recurrent, moderate: Secondary | ICD-10-CM

## 2024-11-25 ENCOUNTER — Other Ambulatory Visit: Payer: Self-pay

## 2024-11-25 NOTE — Patient Instructions (Signed)
 Whitney Benton - I am sorry I was unable to reach you today for our scheduled appointment. I work with [name], MD and am calling to support your healthcare needs. Please contact me at [phone] at your earliest convenience. I look forward to speaking with you soon.   Thank you,   Laymon Doll, BSW [place]/VBCI - [facility] Social Worker [phone]

## 2024-11-25 NOTE — Progress Notes (Signed)
   THERAPIST PROGRESS NOTE Virtual Visit via Video Note  I connected with Whitney Benton on 11/24/2024 at  2:00 PM EST by a video enabled telemedicine application and verified that I am speaking with the correct person using two identifiers.  Location: Patient: home Provider: office   I discussed the limitations of evaluation and management by telemedicine and the availability of in person appointments. The patient expressed understanding and agreed to proceed.   Follow Up Instructions: I discussed the assessment and treatment plan with the patient. The patient was provided an opportunity to ask questions and all were answered. The patient agreed with the plan and demonstrated an understanding of the instructions.   The patient was advised to call back or seek an in-person evaluation if the symptoms worsen or if the condition fails to improve as anticipated.   Session Time: 13 min  Participation Level: Active  Behavioral Response: CasualAlertEuthymic  Type of Therapy: Individual Therapy  Treatment Goals addressed: client will engage in at least 80% of scheduled  ProgressTowards Goals: Progressing  Interventions: CBT and Supportive  Summary:  Whitney Benton is a 53 y.o. female who presents to the scheduled appointment oriented x 5, appropriately dressed, and cooperative.  Client denied hallucinations or delusions. Client reported on today she is doing well but she is now at her son's house watching her grandkids.  Client reported she is not able to talk at length because he cannot possibly over year which she would have to say.  Client reported she is staying with her son who is the father of her 2 grandsons that she watches for the time being but they are also moving out of their house in February so she will have to figure out what she is going to do for housing soon again.  Client reported otherwise she has no complaints or crisis needs.  Suicidal/Homicidal: Nowithout  intent/plan  Therapist Response:  Therapist began the appointment checking in with the client to ask how she has been doing since she was last seen.  Therapist used CBT to listen with active listening and positive emotional support.  Therapist used CBT to ask open-ended questions to assess for any needs and/or concerns.   Plan: Return again in 3 weeks.  Diagnosis: mdd, recurrent episode, mdoerate  Collaboration of Care: Patient refused AEB none requested.  Patient/Guardian was advised Release of Information must be obtained prior to any record release in order to collaborate their care with an outside provider. Patient/Guardian was advised if they have not already done so to contact the registration department to sign all necessary forms in order for us  to release information regarding their care.   Consent: Patient/Guardian gives verbal consent for treatment and assignment of benefits for services provided during this visit. Patient/Guardian expressed understanding and agreed to proceed.   Maddoxx Burkitt Y Zyanne Schumm, LCSW 11/24/2024

## 2024-11-27 NOTE — Progress Notes (Signed)
 rescheduled

## 2024-11-28 ENCOUNTER — Other Ambulatory Visit (HOSPITAL_COMMUNITY): Payer: Self-pay | Admitting: Psychiatry

## 2024-11-28 DIAGNOSIS — F411 Generalized anxiety disorder: Secondary | ICD-10-CM

## 2024-11-28 DIAGNOSIS — F333 Major depressive disorder, recurrent, severe with psychotic symptoms: Secondary | ICD-10-CM

## 2024-11-29 ENCOUNTER — Encounter: Payer: Self-pay | Admitting: "Endocrinology

## 2024-11-29 ENCOUNTER — Other Ambulatory Visit: Payer: Self-pay

## 2024-11-29 ENCOUNTER — Ambulatory Visit: Admitting: "Endocrinology

## 2024-11-29 VITALS — BP 124/80 | HR 65 | Ht 64.0 in | Wt 260.0 lb

## 2024-11-29 DIAGNOSIS — Z794 Long term (current) use of insulin: Secondary | ICD-10-CM

## 2024-11-29 DIAGNOSIS — E7800 Pure hypercholesterolemia, unspecified: Secondary | ICD-10-CM

## 2024-11-29 DIAGNOSIS — E1065 Type 1 diabetes mellitus with hyperglycemia: Secondary | ICD-10-CM

## 2024-11-29 NOTE — Progress Notes (Signed)
 Outpatient Endocrinology Note Whitney Birmingham, MD  11/29/24   Whitney Benton [birth year] [id number]  Referring Provider: [name], MD Primary Care Provider: [name], MD Reason for consultation: Subjective   Assessment & Plan  Diagnoses and all orders for this visit:  Uncontrolled type 1 diabetes mellitus with hyperglycemia (HCC) -     Lipid panel -     Microalbumin / creatinine urine ratio -     Comprehensive metabolic panel with GFR  Insulin  dose changed (HCC)  Pure hypercholesterolemia    Diabetes Type I complicated by hyperglycemia, c-peptide -ve, GAD Ab + Lab Results  Component Value Date   GFR 78.96 10/09/2022   Hba1c goal less than 7, current Hba1c is  Lab Results  Component Value Date   HGBA1C 8.9 (A) 11/11/2024   Will recommend the following: U500 125 units before coffee and 90 units before supper, 30 min before meals (skip lunch dose) Decrease insulin  dose by 5 units if BG is <70  Does want insulin  pump  Has DexCom G7  Patient experiencing  personal life issues-extensively counseled   Stopped Metformin  XR 500 mg bid - max tolerated dose. Patient did not experience any benefit from, only GI S/E  Changed to DexCom G7 from Brent 3 per pt request as herlene keeps falling off, pt unable to afford skin tac Stopped Toujeo  78 units qam and Humalog  26 units tidac 15 min before meals Was taken off of Trulicity  due to stomach issues  No known contraindications/side effects to any of above medications Glucagon  discussed and prescribed with refills on 11/24/23  -Last LD and Tg are as follows: Lab Results  Component Value Date   LDLCALC 95 08/24/2024    Lab Results  Component Value Date   TRIG 150 (H) 08/24/2024   -On atorvastatin  40 mg every day, LDL was 105 -04/06/24: started rosuvastatin  40 mg every day -Follow low fat diet and exercise   -Blood pressure goal <140/90 - Microalbumin/creatinine goal is < 30 -Last MA/Cr is as  follows: Lab Results  Component Value Date   MICROALBUR 1.1 08/24/2024   -not on ACE/ARB  -diet changes including salt restriction -limit eating outside -counseled BP targets per standards of diabetes care -uncontrolled blood pressure can lead to retinopathy, nephropathy and cardiovascular and atherosclerotic heart disease  Reviewed and counseled on: -A1C target -Blood sugar targets -Complications of uncontrolled diabetes  -Checking blood sugar before meals and bedtime and bring log next visit -All medications with mechanism of action and side effects -Hypoglycemia management: rule of 15's, Glucagon  Emergency Kit and medical alert ID -low-carb low-fat plate-method diet -At least 20 minutes of physical activity per day -Annual dilated retinal eye exam and foot exam -compliance and follow up needs -follow up as scheduled or earlier if problem gets worse  Call if blood sugar is less than 70 or consistently above 250    Take a 15 gm snack of carbohydrate at bedtime before you go to sleep if your blood sugar is less than 100.    If you are going to fast after midnight for a test or procedure, ask your physician for instructions on how to reduce/decrease your insulin  dose.    Call if blood sugar is less than 70 or consistently above 250  -Treating a low sugar by rule of 15  (15 gms of sugar every 15 min until sugar is more than 70) If you feel your sugar is low, test your sugar to be sure If  your sugar is low (less than 70), then take 15 grams of a fast acting Carbohydrate (3-4 glucose tablets or glucose gel or 4 ounces of juice or regular soda) Recheck your sugar 15 min after treating low to make sure it is more than 70 If sugar is still less than 70, treat again with 15 grams of carbohydrate          Don't drive the hour of hypoglycemia  If unconscious/unable to eat or drink by mouth, use glucagon  injection or nasal spray baqsimi  and call 911. Can repeat again in 15 min if still  unconscious.  Return in about 4 weeks (around 12/27/2024) for 2:40pm visit and 8 am labs before next visit.   I have reviewed current medications, nurse's notes, allergies, vital signs, past medical and surgical history, family medical history, and social history for this encounter. Counseled patient on symptoms, examination findings, lab findings, imaging results, treatment decisions and monitoring and prognosis. The patient understood the recommendations and agrees with the treatment plan. All questions regarding treatment plan were fully answered.  Whitney Birmingham, MD  11/29/24    History of Present Illness Whitney Benton is a 53 y.o. year old female who presents for follow up of Type I diabetes mellitus.  Whitney Benton was first diagnosed in 2020.   Diabetes education +  Home diabetes regimen: U500 130 units before break fast and 85 units before supper, 30 min before meals  Previously on, Toujeo  72 units qam Humalog  24 units tidac 15 min before meals Metformin  XR 500 mg bid - gets diarrhea   COMPLICATIONS -  MI/Stroke -  retinopathy -  neuropathy -  nephropathy  BLOOD SUGAR DATA CGM interpretation: At today's visit, we reviewed her CGM downloads. The full report is scanned in the media. Reviewing the CGM trends, BG are elevated overnight and low in the morning and afternoon.  Physical Exam  BP 124/80   Pulse 65   Ht 5' 4 (1.626 m)   Wt 260 lb (117.9 kg)   SpO2 97%   BMI 44.63 kg/m    Constitutional: well developed, well nourished Head: normocephalic, atraumatic Eyes: sclera anicteric, no redness Neck: supple Lungs: normal respiratory effort Neurology: alert and oriented Skin: dry, no appreciable rashes Musculoskeletal: no appreciable defects Psychiatric: normal mood and affect Diabetic Foot Exam - Simple   No data filed      Current Medications Patient's Medications  New Prescriptions   No medications on file  Previous Medications   ACCU-CHEK SOFTCLIX  LANCETS LANCETS    Use to check blood sugar 3 times daily   ALBUTEROL  (VENTOLIN  HFA) 108 (90 BASE) MCG/ACT INHALER    Inhale 2 puffs into the lungs every 6 (six) hours as needed for wheezing or shortness of breath (Cough).   AMLODIPINE  (NORVASC ) 10 MG TABLET    Take 1 tablet (10 mg total) by mouth daily.   ATORVASTATIN  (LIPITOR) 40 MG TABLET    Take 1 tablet (40 mg total) by mouth daily.   BLOOD GLUCOSE MONITORING SUPPL (ACCU-CHEK GUIDE) W/DEVICE KIT    Use to check blood sugar 3 times daily.   BLOOD PRESSURE MONITORING (BLOOD PRESSURE CUFF) MISC    1 each by Does not apply route daily. Please provide Large cuff   BUSPIRONE  (BUSPAR ) 30 MG TABLET    Take 1 tablet (30 mg total) by mouth 2 (two) times daily.   CHOLECALCIFEROL  (VITAMIN D3) 10 MCG (400 UNIT) CAPS    Take 400 Units by  mouth daily.   CONTINUOUS GLUCOSE SENSOR (DEXCOM G7 SENSOR) MISC    Change sensor every 10 days.   CYCLOSPORINE  (RESTASIS ) 0.05 % OPHTHALMIC EMULSION    Apply 1 drop into both eyes twice a day   DICLOFENAC  (VOLTAREN ) 50 MG EC TABLET    Take 50 mg by mouth 2 (two) times daily as needed for mild pain (pain score 1-3).   DULOXETINE  (CYMBALTA ) 60 MG CAPSULE    Take 1 capsule (60 mg total) by mouth 2 (two) times daily.   ERENUMAB -AOOE (AIMOVIG ) 140 MG/ML SOAJ    Inject 140 mg into the skin every 28 (twenty-eight) days.   EZETIMIBE  (ZETIA ) 10 MG TABLET    Take 1 tablet (10 mg total) by mouth daily.   GABAPENTIN  (NEURONTIN ) 300 MG CAPSULE    Take 1 capsule (300 mg total) by mouth 3 (three) times daily.   GLUCAGON  (BAQSIMI  ONE PACK) 3 MG/DOSE POWD    Place 1 Device into the nose as needed (Low blood sugar with impaired consciousness).   GLUCOSE BLOOD (ACCU-CHEK GUIDE) TEST STRIP    Use to check blood sugar 3 times daily   HYDROXYZINE  (ATARAX ) 50 MG TABLET    Take 1 tablet (50 mg total) by mouth 3 (three) times daily.   INSULIN  PEN NEEDLE (PEN NEEDLES) 31G X 5 MM MISC    Use to inject insulin  three times daily.   INSULIN  REGULAR  HUMAN CONCENTRATED (HUMULIN  R U-500 KWIKPEN) 500 UNIT/ML KWIKPEN    Inject 85-95 Units into the skin daily with breakfast AND 75-80 Units daily before lunch AND 65-70 Units daily before supper. Take 30 minutes before meals.   LORATADINE  (CLARITIN ) 10 MG TABLET    Take 1 tablet (10 mg total) by mouth daily as needed for allergies.   METOCLOPRAMIDE  (REGLAN ) 5 MG TABLET    Take 1 tablet (5 mg total) by mouth every 8 (eight) hours as needed for nausea. Dose decrease   METOPROLOL  TARTRATE (LOPRESSOR ) 25 MG TABLET    Take 1 tablet (25 mg total) by mouth 2 (two) times daily.   MULTIPLE VITAMINS-MINERALS (EYE VITAMINS PO)    Take 1 capsule by mouth in the morning and at bedtime.   OLANZAPINE -SAMIDORPHAN (LYBALVI ) 15-10 MG TABS    Take 15 mg by mouth at bedtime.   OMEPRAZOLE  (PRILOSEC) 40 MG CAPSULE    TAKE 1 CAPSULE BY MOUTH ONCE DAILY 30 MINUTES BEFORE BREAKFAST   ONDANSETRON  (ZOFRAN -ODT) 4 MG DISINTEGRATING TABLET    Take 1 tablet (4 mg total) by mouth every 8 (eight) hours as needed for nausea or vomiting.   PRESCRIPTION MEDICATION    CPAP- At bedtime and during any time of rest   SUMATRIPTAN  (IMITREX ) 100 MG TABLET    TAKE 1 TABLET EARLIEST ONSET OF MIGRAINE. MAY REPEAT IN 2 HOURS IF HEADACHE PERSISTS OR RECURS. MAXIMUM 2 TABLETS IN 24 HOURS.   TRAZODONE  (DESYREL ) 150 MG TABLET    Take 1 tablet (150 mg total) by mouth at bedtime as needed for sleep.   TYLENOL  8 HOUR ARTHRITIS PAIN 650 MG CR TABLET    Take 1,300 mg by mouth every 8 (eight) hours as needed for pain.   VITAMIN B-12 (CYANOCOBALAMIN ) 50 MCG TABLET    Take 50 mcg by mouth daily.  Modified Medications   No medications on file  Discontinued Medications   No medications on file    Allergies Allergies  Allergen Reactions   Aspirin Hives   Trulicity  [Dulaglutide ] Nausea Only and Other (See  Comments)    DC'd due to chronic gastric and abdominal pain with nausea   Oxycodone  Nausea And Vomiting    Past Medical History Past Medical  History:  Diagnosis Date   Allergy    Anxiety    Arthritis    Asthma    Depression    Diabetes mellitus without complication (HCC)    GERD (gastroesophageal reflux disease)    Glaucoma suspect    Hyperlipidemia    Trichomonas infection     Past Surgical History Past Surgical History:  Procedure Laterality Date   CESAREAN SECTION     UPPER GASTROINTESTINAL ENDOSCOPY     VAGINAL HYSTERECTOMY  12/23/2004   Fibroids, menorrhagia, benign pathology    Family History family history includes Colon cancer in her paternal grandmother; Colon cancer (age of onset: 59) in her father; Depression in her mother; Diabetes in her father and mother; Hypertension in her father and mother; Mental illness in her mother.  Social History Social History   Socioeconomic History   Marital status: Significant Other    Spouse name: Not on file   Number of children: 3   Years of education: 14   Highest education level: Not on file  Occupational History   Not on file  Tobacco Use   Smoking status: Former    Current packs/day: 0.00    Average packs/day: 0.5 packs/day for 13.0 years (6.5 ttl pk-yrs)    Types: Cigarettes    Start date: 2008    Quit date: 2021    Years since quitting: 4.9    Passive exposure: Past   Smokeless tobacco: Never  Vaping Use   Vaping status: Never Used  Substance and Sexual Activity   Alcohol use: Not Currently    Comment: occasional   Drug use: Yes    Types: Marijuana    Comment: occ   Sexual activity: Yes    Birth control/protection: Surgical  Other Topics Concern   Not on file  Social History Narrative   Patient lives at home with mother and father , one story    Patient has 3 children    Patient is single   Patient has 14 years of education    Patient is right handed    Caffeine none   Social Drivers of Corporate Investment Banker Strain: High Risk (10/28/2024)   Overall Financial Resource Strain (CARDIA)    Difficulty of Paying Living Expenses: Very  hard  Food Insecurity: Food Insecurity Present (11/11/2024)   Hunger Vital Sign    Worried About Running Out of Food in the Last Year: Often true    Ran Out of Food in the Last Year: Often true  Transportation Needs: No Transportation Needs (10/28/2024)   PRAPARE - Administrator, Civil Service (Medical): No    Lack of Transportation (Non-Medical): No  Physical Activity: Inactive (05/11/2024)   Exercise Vital Sign    Days of Exercise per Week: 0 days    Minutes of Exercise per Session: 0 min  Stress: Stress Concern Present (05/11/2024)   [employer] of Occupational Health - Occupational Stress Questionnaire    Feeling of Stress : Very much  Social Connections: Unknown (05/11/2024)   Social Connection and Isolation Panel    Frequency of Communication with Friends and Family: More than three times a week    Frequency of Social Gatherings with Friends and Family: More than three times a week    Attends Religious Services: More than 4 times per year  Active Member of Clubs or Organizations: Yes    Attends Club or Organization Meetings: 1 to 4 times per year    Marital Status: Patient declined  Intimate Partner Violence: Not At Risk (11/03/2024)   Humiliation, Afraid, Rape, and Kick questionnaire    Fear of Current or Ex-Partner: No    Emotionally Abused: No    Physically Abused: No    Sexually Abused: No    Lab Results  Component Value Date   HGBA1C 8.9 (A) 11/11/2024   HGBA1C 8.0 (A) 08/24/2024   HGBA1C 10.0 (H) 05/01/2024   Lab Results  Component Value Date   CHOL 165 08/24/2024   Lab Results  Component Value Date   HDL 45 (L) 08/24/2024   Lab Results  Component Value Date   LDLCALC 95 08/24/2024   Lab Results  Component Value Date   TRIG 150 (H) 08/24/2024   Lab Results  Component Value Date   CHOLHDL 3.7 08/24/2024   Lab Results  Component Value Date   CREATININE 0.95 10/21/2024   Lab Results  Component Value Date   GFR 78.96 10/09/2022    Lab Results  Component Value Date   MICROALBUR 1.1 08/24/2024      Component Value Date/Time   NA 140 10/21/2024 1224   NA 138 03/23/2024 0934   K 3.7 10/21/2024 1224   CL 104 10/21/2024 1224   CO2 24 10/21/2024 1224   GLUCOSE 204 (H) 10/21/2024 1224   BUN 12 10/21/2024 1224   BUN 10 03/23/2024 0934   CREATININE 0.95 10/21/2024 1224   CREATININE 0.90 11/17/2023 1047   CALCIUM  9.3 10/21/2024 1224   PROT 8.3 (H) 10/20/2024 1303   PROT 6.9 03/23/2024 0934   ALBUMIN 4.5 10/20/2024 1303   ALBUMIN 4.3 03/23/2024 0934   AST 29 10/20/2024 1303   ALT 24 10/20/2024 1303   ALKPHOS 127 (H) 10/20/2024 1303   BILITOT 0.7 10/20/2024 1303   BILITOT 0.3 03/23/2024 0934   GFRNONAA >60 10/21/2024 1224   GFRAA >60 08/10/2020 2100      Latest Ref Rng & Units 10/21/2024   12:24 PM 10/21/2024    7:29 AM 10/21/2024   12:31 AM  BMP  Glucose 70 - 99 mg/dL 795  833  836   BUN 6 - 20 mg/dL 12  12  11    Creatinine 0.44 - 1.00 mg/dL 9.04  8.97  9.07   Sodium 135 - 145 mmol/L 140  143  142   Potassium 3.5 - 5.1 mmol/L 3.7  4.0  4.3   Chloride 98 - 111 mmol/L 104  105  105   CO2 22 - 32 mmol/L 24  28  27    Calcium  8.9 - 10.3 mg/dL 9.3  9.2  9.7        Component Value Date/Time   WBC 9.5 10/21/2024 0031   RBC 4.43 10/21/2024 0031   HGB 11.6 (L) 10/21/2024 0031   HGB 13.3 12/12/2022 1036   HCT 38.0 10/21/2024 0031   HCT 40.4 12/12/2022 1036   PLT 475 (H) 10/21/2024 0031   PLT 436 12/12/2022 1036   MCV 85.8 10/21/2024 0031   MCV 86 12/12/2022 1036   MCH 26.2 10/21/2024 0031   MCHC 30.5 10/21/2024 0031   RDW 15.2 10/21/2024 0031   RDW 12.9 12/12/2022 1036   LYMPHSABS 2.9 10/19/2024 0856   LYMPHSABS 2.4 07/14/2019 1058   MONOABS 0.7 10/19/2024 0856   EOSABS 0.1 10/19/2024 0856   EOSABS 0.1 07/14/2019 1058   BASOSABS  0.0 10/19/2024 0856   BASOSABS 0.0 07/14/2019 1058     Parts of this note may have been dictated using voice recognition software. There may be variances in spelling  and vocabulary which are unintentional. Not all errors are proofread. Please notify the dino if any discrepancies are noted or if the meaning of any statement is not clear.

## 2024-11-29 NOTE — Patient Instructions (Addendum)
 Will recommend the following: U500 125 units before coffee and 90 units before supper, 30 min before meals (skip lunch dose) Decrease insulin  dose by 5 units if BG is <70  _____________   Goals of DM therapy:  Morning Fasting blood sugar: 80-140  Blood sugar before meals: 80-140 Bed time blood sugar: 100-150  A1C <7%, limited only by hypoglycemia  1.Diabetes medications and their side effects discussed, including hypoglycemia    2. Check blood glucose:  a) Always check blood sugars before driving. Please see below (under hypoglycemia) on how to manage b) Check a minimum of 3 times/day or more as needed when having symptoms of hypoglycemia.   c) Try to check blood glucose before sleeping/in the middle of the night to ensure that it is remaining stable and not dropping less than 100 d) Check blood glucose more often if sick  3. Diet: a) 3 meals per day schedule b: Restrict carbs to 60-70 grams (4 servings) per meal c) Colorful vegetables - 3 servings a day, and low sugar fruit 2 servings/day Plate control method: 1/4 plate protein, 1/4 starch, 1/2 green, yellow, or red vegetables d) Avoid carbohydrate snacks unless hypoglycemic episode, or increased physical activity  4. Regular exercise as tolerated, preferably 3 or more hours a week  5. Hypoglycemia: a)  Do not drive or operate machinery without first testing blood glucose to assure it is over 90 mg%, or if dizzy, lightheaded, not feeling normal, etc, or  if foot or leg is numb or weak. b)  If blood glucose less than 70, take four 5gm Glucose tabs or 15-30 gm Glucose gel.  Repeat every 15 min as needed until blood sugar is >100 mg/dl. If hypoglycemia persists then call 911.   6. Sick day management: a) Check blood glucose more often b) Continue usual therapy if blood sugars are elevated.   7. Contact the doctor immediately if blood glucose is frequently <60 mg/dl, or an episode of severe hypoglycemia occurs (where someone had  to give you glucose/  glucagon  or if you passed out from a low blood glucose), or if blood glucose is persistently >350 mg/dl, for further management  8. A change in level of physical activity or exercise and a change in diet may also affect your blood sugar. Check blood sugars more often and call if needed.  Instructions: 1. Bring glucose meter, blood glucose records on every visit for review 2. Continue to follow up with primary care physician and other providers for medical care 3. Yearly eye  and foot exam 4. Please get blood work done prior to the next appointment

## 2024-11-30 ENCOUNTER — Encounter: Payer: Self-pay | Admitting: "Endocrinology

## 2024-12-02 ENCOUNTER — Encounter (HOSPITAL_COMMUNITY): Payer: Self-pay | Admitting: Psychiatry

## 2024-12-02 ENCOUNTER — Telehealth (INDEPENDENT_AMBULATORY_CARE_PROVIDER_SITE_OTHER): Admitting: Psychiatry

## 2024-12-02 DIAGNOSIS — F411 Generalized anxiety disorder: Secondary | ICD-10-CM | POA: Diagnosis not present

## 2024-12-02 DIAGNOSIS — F333 Major depressive disorder, recurrent, severe with psychotic symptoms: Secondary | ICD-10-CM

## 2024-12-02 MED ORDER — DULOXETINE HCL 60 MG PO CPEP: 60.0000 mg | ORAL_CAPSULE | Freq: Two times a day (BID) | ORAL | 1 refills | Status: AC

## 2024-12-02 MED ORDER — BUSPIRONE HCL 30 MG PO TABS: 30.0000 mg | ORAL_TABLET | Freq: Two times a day (BID) | ORAL | 1 refills | Status: AC

## 2024-12-02 MED ORDER — HYDROXYZINE HCL 50 MG PO TABS: 50.0000 mg | ORAL_TABLET | Freq: Three times a day (TID) | ORAL | 1 refills | Status: AC

## 2024-12-02 MED ORDER — GABAPENTIN 300 MG PO CAPS: 300.0000 mg | ORAL_CAPSULE | Freq: Three times a day (TID) | ORAL | 1 refills | Status: AC

## 2024-12-02 MED ORDER — TRAZODONE HCL 150 MG PO TABS: 150.0000 mg | ORAL_TABLET | Freq: Every evening | ORAL | 1 refills | Status: AC | PRN

## 2024-12-02 NOTE — Progress Notes (Signed)
 BH MD/PA/NP OP Progress Note Virtual Visit via Video Note  I connected with Whitney Benton on 12/02/2024 at 11:00 AM EST by a video enabled telemedicine application and verified that I am speaking with the correct person using two identifiers.  Location: Patient: Home Provider: Clinic   I discussed the limitations of evaluation and management by telemedicine and the availability of in person appointments. The patient expressed understanding and agreed to proceed.  I provided 30 minutes of non-face-to-face time during this encounter.          12/02/2024 10:33 AM Whitney Benton  MRN:  [id number]  Chief Complaint: I was taken off lybalvi    HPI: 53 year old female seen today for follow up psychiatric evaluation.  She has a psychiatric history of insomnia, substance induced mood disorder, anxiety and depression.   She is currently managed on Buspar  15 mg three times daily, Lybalvi  15-10 mg nightly, hydroxyzine  25 mg three times daily as needed, gabapentin  300 mg 3 times daily, trazodone  150 mg nightly as needed, and Cymbalta  120 mg daily  Today she notes her medications are somewhat effective in managing her psychiatric condition.   Today was well-groomed, pleasant, cooperative, and engaged in conversation.  She reports that she was taken off of Lybalvi .  Patient was uncertain why.  Provider could not find any medical records where she was taken off the medication.  Patient notes that he continues to be irritable, anxious, and depressed.  He reports that life stressors are weighing heavily on her.  She notes that she no longer lives with her older son. She notes that he told her she was interrupting him and his relationship. She informed clinical research associate that she is living with her middle sons home. She reports that this has been stressful because he is picky and likes thing a certain way.  She also informed her that she continues to financially be stressed.  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    Since her last visit she reports her anxiety and depression continues to be problematic.  Provider conducted a GAD-7 and patient scored 19, at her last visit she scored a 21.  Provider also conducted PHQ-9 of a score 24, visit she scored a 24.  She endorses adequate sleep and appetite.  Today she denies SI/HI/AVH, mania, paranoia.  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  She does note that she engages in marijuana.  Patient continues to have neck and back pain.  She quantifies it as 9 out of 10 today.  She notes that Cymbalta  and gabapentin  are effective in managing her pain.  At this time patient does not wish to restart Lybalvi .  She is agreeable to starting Abilify 5 mg daily to help manage depressive symptoms. Potential side effects of medication and risks vs benefits of treatment vs non-treatment were explained and discussed. All questions were answered. She will follow-up with counseling for therapy.  No other concerns noted at this time.   Visit Diagnosis:    ICD-10-CM   1. Severe recurrent major depressive disorder with psychotic features (HCC)  F33.3 DULoxetine  (CYMBALTA ) 60 MG capsule    gabapentin  (NEURONTIN ) 300 MG capsule    busPIRone  (BUSPAR ) 30 MG tablet    traZODone  (DESYREL ) 150 MG tablet    2. Generalized anxiety disorder  F41.1 DULoxetine  (CYMBALTA ) 60 MG capsule    hydrOXYzine  (ATARAX ) 50 MG tablet    gabapentin  (NEURONTIN )  300 MG capsule    busPIRone  (BUSPAR ) 30 MG tablet    traZODone  (DESYREL ) 150 MG tablet              Past Psychiatric History: cocaine use, marijuana use, anxiety and depression  Past Medical History:  Past Medical History:  Diagnosis Date   Allergy    Anxiety    Arthritis    Asthma    Depression    Diabetes mellitus without complication (HCC)    GERD (gastroesophageal reflux disease)    Glaucoma suspect    Hyperlipidemia    Trichomonas infection     Past  Surgical History:  Procedure Laterality Date   CESAREAN SECTION     UPPER GASTROINTESTINAL ENDOSCOPY     VAGINAL HYSTERECTOMY  12/23/2004   Fibroids, menorrhagia, benign pathology    Family Psychiatric History: Mother schizophrenia and bipolar disorder  Family History:  Family History  Problem Relation Age of Onset   Diabetes Mother    Mental illness Mother    Depression Mother    Hypertension Mother    Colon cancer Father 74   Hypertension Father    Diabetes Father    Colon cancer Paternal Grandmother    Esophageal cancer Neg Hx    Stomach cancer Neg Hx    Rectal cancer Neg Hx    Asthma Neg Hx    Breast cancer Neg Hx     Social History:  Social History   Socioeconomic History   Marital status: Significant Other    Spouse name: Not on file   Number of children: 3   Years of education: 14   Highest education level: Not on file  Occupational History   Not on file  Tobacco Use   Smoking status: Former    Current packs/day: 0.00    Average packs/day: 0.5 packs/day for 13.0 years (6.5 ttl pk-yrs)    Types: Cigarettes    Start date: 2008    Quit date: 2021    Years since quitting: 4.9    Passive exposure: Past   Smokeless tobacco: Never  Vaping Use   Vaping status: Never Used  Substance and Sexual Activity   Alcohol use: Not Currently    Comment: occasional   Drug use: Yes    Types: Marijuana    Comment: occ   Sexual activity: Yes    Birth control/protection: Surgical  Other Topics Concern   Not on file  Social History Narrative   Patient lives at home with mother and father , one story    Patient has 3 children    Patient is single   Patient has 14 years of education    Patient is right handed    Caffeine none   Social Drivers of Health   Tobacco Use: Medium Risk (12/02/2024)   Patient History    Smoking Tobacco Use: Former    Smokeless Tobacco Use: Never    Passive Exposure: Past  Physicist, Medical Strain: High Risk (10/28/2024)   Overall  Financial Resource Strain (CARDIA)    Difficulty of Paying Living Expenses: Very hard  Food Insecurity: Food Insecurity Present (11/11/2024)   Epic    Worried About Programme Researcher, Broadcasting/film/video in the Last Year: Often true    Ran Out of Food in the Last Year: Often true  Transportation Needs: No Transportation Needs (10/28/2024)   Epic    Lack of Transportation (Medical): No    Lack of Transportation (Non-Medical): No  Physical Activity: Inactive (05/11/2024)   Exercise Vital  Sign    Days of Exercise per Week: 0 days    Minutes of Exercise per Session: 0 min  Stress: Stress Concern Present (05/11/2024)   [employer] of Occupational Health - Occupational Stress Questionnaire    Feeling of Stress : Very much  Social Connections: Unknown (05/11/2024)   Social Connection and Isolation Panel    Frequency of Communication with Friends and Family: More than three times a week    Frequency of Social Gatherings with Friends and Family: More than three times a week    Attends Religious Services: More than 4 times per year    Active Member of Clubs or Organizations: Yes    Attends Banker Meetings: 1 to 4 times per year    Marital Status: Patient declined  Depression (PHQ2-9): High Risk (12/02/2024)   Depression (PHQ2-9)    PHQ-2 Score: 24  Alcohol Screen: Low Risk (05/11/2024)   Alcohol Screen    Last Alcohol Screening Score (AUDIT): 0  Housing: High Risk (11/11/2024)   Epic    Unable to Pay for Housing in the Last Year: Yes    Number of Times Moved in the Last Year: Not on file    Homeless in the Last Year: Yes  Utilities: At Risk (11/11/2024)   Epic    Threatened with loss of utilities: Yes  Health Literacy: Adequate Health Literacy (05/11/2024)   B1300 Health Literacy    Frequency of need for help with medical instructions: Never    Allergies:  Allergies  Allergen Reactions   Aspirin Hives   Trulicity  [Dulaglutide ] Nausea Only and Other (See Comments)    DC'd due to  chronic gastric and abdominal pain with nausea   Oxycodone  Nausea And Vomiting    Metabolic Disorder Labs: Lab Results  Component Value Date   HGBA1C 8.9 (A) 11/11/2024   MPG 240.3 05/01/2024   MPG 235 02/18/2024   No results found for: PROLACTIN Lab Results  Component Value Date   CHOL 165 08/24/2024   TRIG 150 (H) 08/24/2024   HDL 45 (L) 08/24/2024   CHOLHDL 3.7 08/24/2024   VLDL 19 11/14/2014   LDLCALC 95 08/24/2024   LDLCALC 105 (H) 03/31/2024   Lab Results  Component Value Date   TSH 2.220 03/23/2024   TSH 2.460 04/30/2021    Therapeutic Level Labs: No results found for: LITHIUM No results found for: VALPROATE No results found for: CBMZ  Current Medications: Current Outpatient Medications  Medication Sig Dispense Refill   ARIPiprazole (ABILIFY) 5 MG tablet Take 1 tablet (5 mg total) by mouth daily. 90 tablet 1   Accu-Chek Softclix Lancets lancets Use to check blood sugar 3 times daily 100 each 6   albuterol  (VENTOLIN  HFA) 108 (90 Base) MCG/ACT inhaler Inhale 2 puffs into the lungs every 6 (six) hours as needed for wheezing or shortness of breath (Cough). 18 g 0   amLODipine  (NORVASC ) 10 MG tablet Take 1 tablet (10 mg total) by mouth daily. 90 tablet 1   atorvastatin  (LIPITOR) 40 MG tablet Take 1 tablet (40 mg total) by mouth daily. 90 tablet 2   Blood Glucose Monitoring Suppl (ACCU-CHEK GUIDE) w/Device KIT Use to check blood sugar 3 times daily. 1 kit 0   Blood Pressure Monitoring (BLOOD PRESSURE CUFF) MISC 1 each by Does not apply route daily. Please provide Large cuff 1 each 0   busPIRone  (BUSPAR ) 30 MG tablet Take 1 tablet (30 mg total) by mouth 2 (two) times daily. 180 tablet 1  Cholecalciferol  (VITAMIN D3) 10 MCG (400 UNIT) CAPS Take 400 Units by mouth daily.     Continuous Glucose Sensor (DEXCOM G7 SENSOR) MISC Change sensor every 10 days. 9 each 3   cycloSPORINE  (RESTASIS ) 0.05 % ophthalmic emulsion Apply 1 drop into both eyes twice a day 180 each 3    diclofenac  (VOLTAREN ) 50 MG EC tablet Take 50 mg by mouth 2 (two) times daily as needed for mild pain (pain score 1-3).     DULoxetine  (CYMBALTA ) 60 MG capsule Take 1 capsule (60 mg total) by mouth 2 (two) times daily. 180 capsule 1   Erenumab -aooe (AIMOVIG ) 140 MG/ML SOAJ Inject 140 mg into the skin every 28 (twenty-eight) days. 1.12 mL 11   ezetimibe  (ZETIA ) 10 MG tablet Take 1 tablet (10 mg total) by mouth daily. 90 tablet 3   gabapentin  (NEURONTIN ) 300 MG capsule Take 1 capsule (300 mg total) by mouth 3 (three) times daily. 270 capsule 1   Glucagon  (BAQSIMI  ONE PACK) 3 MG/DOSE POWD Place 1 Device into the nose as needed (Low blood sugar with impaired consciousness). 2 each 3   glucose blood (ACCU-CHEK GUIDE) test strip Use to check blood sugar 3 times daily 100 each 6   hydrOXYzine  (ATARAX ) 50 MG tablet Take 1 tablet (50 mg total) by mouth 3 (three) times daily. 270 tablet 1   Insulin  Pen Needle (PEN NEEDLES) 31G X 5 MM MISC Use to inject insulin  three times daily. 100 each 6   insulin  regular human CONCENTRATED (HUMULIN  R U-500 KWIKPEN) 500 UNIT/ML KwikPen Inject 85-95 Units into the skin daily with breakfast AND 75-80 Units daily before lunch AND 65-70 Units daily before supper. Take 30 minutes before meals. 12 mL 3   loratadine  (CLARITIN ) 10 MG tablet Take 1 tablet (10 mg total) by mouth daily as needed for allergies. 30 tablet 3   metoCLOPramide  (REGLAN ) 5 MG tablet Take 1 tablet (5 mg total) by mouth every 8 (eight) hours as needed for nausea. Dose decrease 90 tablet 3   metoprolol  tartrate (LOPRESSOR ) 25 MG tablet Take 1 tablet (25 mg total) by mouth 2 (two) times daily. 60 tablet 5   Multiple Vitamins-Minerals (EYE VITAMINS PO) Take 1 capsule by mouth in the morning and at bedtime.     OLANZapine -Samidorphan (LYBALVI ) 15-10 MG TABS Take 15 mg by mouth at bedtime. 30 tablet 3   omeprazole  (PRILOSEC) 40 MG capsule TAKE 1 CAPSULE BY MOUTH ONCE DAILY 30 MINUTES BEFORE BREAKFAST 90 capsule 0    ondansetron  (ZOFRAN -ODT) 4 MG disintegrating tablet Take 1 tablet (4 mg total) by mouth every 8 (eight) hours as needed for nausea or vomiting. 20 tablet 0   PRESCRIPTION MEDICATION CPAP- At bedtime and during any time of rest     SUMAtriptan  (IMITREX ) 100 MG tablet TAKE 1 TABLET EARLIEST ONSET OF MIGRAINE. MAY REPEAT IN 2 HOURS IF HEADACHE PERSISTS OR RECURS. MAXIMUM 2 TABLETS IN 24 HOURS. 10 tablet 5   traZODone  (DESYREL ) 150 MG tablet Take 1 tablet (150 mg total) by mouth at bedtime as needed for sleep. 90 tablet 1   TYLENOL  8 HOUR ARTHRITIS PAIN 650 MG CR tablet Take 1,300 mg by mouth every 8 (eight) hours as needed for pain.     vitamin B-12 (CYANOCOBALAMIN ) 50 MCG tablet Take 50 mcg by mouth daily.     No current facility-administered medications for this visit.     Musculoskeletal: Strength & Muscle Tone: within normal limits and telehealth visit Gait & Station: normal, telehealth  visit Patient leans: N/A  Psychiatric Specialty Exam: Review of Systems  There were no vitals taken for this visit.There is no height or weight on file to calculate BMI.  General Appearance: Well Groomed  Eye Contact:  Good  Speech:  Clear and Coherent  Volume:  Normal  Mood:  Anxious and Depressed  Affect:  Appropriate and Congruent  Thought Process:  Coherent, Goal Directed, and Linear  Orientation:  Full (Time, Place, and Person)  Thought Content: WDL and Logical,   Suicidal Thoughts:  No  Homicidal Thoughts:  No  Memory:  Immediate;   Good Recent;   Good Remote;   Good  Judgement:  Good  Insight:  Good  Psychomotor Activity:  Normal  Concentration:  Concentration: Good and Attention Span: Good  Recall:  Good  Fund of Knowledge: Good  Language: Good  Akathisia:  No  Handed:  Right  AIMS (if indicated):  not done,   Assets:  Communication Skills Desire for Improvement Housing Intimacy Physical Health Social Support  ADL's:  Intact  Cognition: WNL  Sleep:  Fair    Screenings: AIMS    Flowsheet Row Clinical Support from 07/28/2024 in [facility] Clinical Support from 01/27/2024 in [facility]  AIMS Total Score 0 0   GAD-7    Flowsheet Row Video Visit from 12/02/2024 in [facility] Patient Outreach Telephone from 11/03/2024 in [place] [facility]UL[facility]ALTH DEPARTMENT Patient Outreach Telephone from 09/30/2024 in [place] [facility] POPULATION [facility]ALTH DEPARTMENT Office Visit from 09/13/2024 in [facility] [place] - A Dept Of [place]. [facility] Patient Outreach Telephone from 08/17/2024 in [facility] POPULATION HEALTH DEPARTMENT  Total GAD-7 Score 21 19 19 21 21    PHQ2-9    Flowsheet Row Video Visit from 12/02/2024 in [facility] Patient Outreach Telephone from 11/03/2024 in [place] POPULATION HEALTH DEPARTMENT Patient Outreach Telephone from 09/30/2024 in [place] [facility] POPULATION HEALTH DEPARTMENT Office Visit from 09/13/2024 in [facility] [place] - A Dept Of [place]. [facility] Patient Outreach Telephone from 08/17/2024 in [place] POPULATION HEALTH DEPARTMENT  PHQ-2 Total Score 6 6 6 6 6   PHQ-9 Total Score 24 23 23 24  --   Flowsheet Row ED to Hosp-Admission (Discharged) from 10/20/2024 in [facility]STE[facility]OWN ED from 10/19/2024 in [facility] Emergency Department at [facility] UC from 08/29/2024 in [facility] at [facility] RISK CATEGORY No Risk No Risk No Risk     Assessment and Plan: Patient notes that her anxiety and depression continues to be bothersome hAt this time patient does not wish to restart Lybalvi .  She is agreeable to starting Abilify 5 mg daily to help manage depressive symptoms.   1. Severe recurrent major depressive disorder with psychotic features (HCC)  Continue- DULoxetine  (CYMBALTA ) 60 MG capsule; Take 1 capsule (60 mg  total) by mouth 2 (two) times daily.  Dispense: 180 capsule; Refill: 1 Continue- gabapentin  (NEURONTIN ) 300 MG capsule; Take 1 capsule (300 mg total) by mouth 3 (three) times daily.  Dispense: 270 capsule; Refill: 1 Continue- busPIRone  (BUSPAR ) 30 MG tablet; Take 1 tablet (30 mg total) by mouth 2 (two) times daily.  Dispense: 180 tablet; Refill: 1 Continue- traZODone  (DESYREL ) 150 MG tablet; Take 1 tablet (150 mg total) by mouth at bedtime as needed for sleep.  Dispense: 90 tablet; Refill: 1  2. Generalized anxiety disorder  Continue- DULoxetine  (CYMBALTA )  60 MG capsule; Take 1 capsule (60 mg total) by mouth 2 (two) times daily.  Dispense: 180 capsule; Refill: 1 Continue- hydrOXYzine  (ATARAX ) 50 MG tablet; Take 1 tablet (50 mg total) by mouth 3 (three) times daily.  Dispense: 270 tablet; Refill: 1 Continue- gabapentin  (NEURONTIN ) 300 MG capsule; Take 1 capsule (300 mg total) by mouth 3 (three) times daily.  Dispense: 270 capsule; Refill: 1 Continue- busPIRone  (BUSPAR ) 30 MG tablet; Take 1 tablet (30 mg total) by mouth 2 (two) times daily.  Dispense: 180 tablet; Refill: 1 Continue- traZODone  (DESYREL ) 150 MG tablet; Take 1 tablet (150 mg total) by mouth at bedtime as needed for sleep.  Dispense: 90 tablet; Refill: 1      Follow-up in 2 months Follow-up with therapy   Zane FORBES Bach, NP 12/02/2024, 10:33 AM

## 2024-12-03 ENCOUNTER — Other Ambulatory Visit: Payer: Self-pay

## 2024-12-03 NOTE — Patient Instructions (Signed)
 Iva JONELLE Daring - I am sorry I was unable to reach you today for our scheduled appointment. I work with [name], MD and am calling to support your healthcare needs. Please contact me at [phone] at your earliest convenience. I look forward to speaking with you soon.   Thank you,   Laymon Doll, BSW [place]/[facility] - [facility] Social Worker [phone]

## 2024-12-06 ENCOUNTER — Other Ambulatory Visit: Payer: Self-pay

## 2024-12-08 ENCOUNTER — Other Ambulatory Visit: Payer: Self-pay | Admitting: *Deleted

## 2024-12-08 ENCOUNTER — Ambulatory Visit (HOSPITAL_COMMUNITY): Admitting: Clinical

## 2024-12-08 ENCOUNTER — Encounter (HOSPITAL_COMMUNITY): Payer: Self-pay

## 2024-12-08 ENCOUNTER — Other Ambulatory Visit: Payer: Self-pay

## 2024-12-08 NOTE — Patient Outreach (Signed)
 Complex Care Management   Visit Note  12/08/2024  Name:  Whitney Benton MRN: [id number] DOB: June 29, 1971  Situation: Referral received for Complex Care Management related to Diabetes and Asthma I obtained verbal consent from Parent.  Visit completed with Patient  on the phone  Background:   Past Medical History:  Diagnosis Date   Allergy    Anxiety    Arthritis    Asthma    Depression    Diabetes mellitus without complication (HCC)    GERD (gastroesophageal reflux disease)    Glaucoma suspect    Hyperlipidemia    Trichomonas infection     Assessment: Patient Reported Symptoms:  Cognitive Cognitive Status: Able to follow simple commands, Alert and oriented to person, place, and time, Insightful and able to interpret abstract concepts, Normal speech and language skills Cognitive/Intellectual Conditions Management [RPT]: None reported or documented in medical history or problem list   Health Maintenance Behaviors: Annual physical exam, Sleep adequate, Healthy diet, Stress management Healing Pattern: Average Health Facilitated by: Healthy diet, Prayer/meditation, Stress management  Neurological Neurological Review of Symptoms: Headaches Neurological Management Strategies: Adequate rest, Medication therapy, Routine screening Neurological Self-Management Outcome: 3 (uncertain) Neurological Comment: Patient reports that she has chronic migraines.  HEENT HEENT Symptoms Reported: No symptoms reported HEENT Management Strategies: Adequate rest, Routine screening, Medication therapy, Coping strategies HEENT Self-Management Outcome: 4 (good)    Cardiovascular Cardiovascular Symptoms Reported: No symptoms reported Does patient have uncontrolled Hypertension?: No Cardiovascular Management Strategies: Adequate rest, Routine screening, Coping strategies Cardiovascular Self-Management Outcome: 3 (uncertain) Cardiovascular Comment: Patient reports no recent blood pressure readings.  Patient  encouraged to take BP once daily and log readings.  Respiratory Respiratory Symptoms Reported: Shortness of breath Other Respiratory Symptoms: Patient reports occassional shortness of breath and wheezing when she walks long periods of time.  She reports that she continues to use the CPAP at night. Respiratory Management Strategies: Adequate rest, Asthma action plan, Routine screening Respiratory Self-Management Outcome: 3 (uncertain)  Endocrine Endocrine Symptoms Reported: Hyperglycemia, Hypoglycemia Is patient diabetic?: Yes Is patient checking blood sugars at home?: Yes List most recent blood sugar readings, include date and time of day: Patient reports that she has Dexcom to monitor her blood sugars.  She informs me that blood sugar today was 100.  She reports that blood sugars have ranged from 53 yo 300.  I discussed action plan when blood sugar is low and how to count carbs to manage blood sugars daily. Endocrine Self-Management Outcome: 3 (uncertain) Endocrine Comment: Discussed aiming for 150 minutes per week of moderate-intensity activity( brisk walking, swimming or cycling.  Gastrointestinal Gastrointestinal Symptoms Reported: No symptoms reported Gastrointestinal Management Strategies: Activity, Adequate rest, Medication therapy Gastrointestinal Self-Management Outcome: 4 (good)    Genitourinary Genitourinary Symptoms Reported: No symptoms reported Genitourinary Management Strategies: Adequate rest Genitourinary Self-Management Outcome: 4 (good)  Integumentary Integumentary Symptoms Reported: No symptoms reported Skin Management Strategies: Routine screening  Musculoskeletal Musculoskelatal Symptoms Reviewed: Back pain, Joint pain Additional Musculoskeletal Details: Patient reports that she has chronic back pain and joint pain.  Patient reports that she takes Tylenol  Arthritis as needed.  She reports that her pain level is 0/10 on pain scale today. Musculoskeletal Management  Strategies: Routine screening, Medication therapy Musculoskeletal Self-Management Outcome: 3 (uncertain) Falls in the past year?: No Number of falls in past year: 1 or less Was there an injury with Fall?: No Fall Risk Category Calculator: 0 Patient Fall Risk Level: Low Fall Risk Patient at Risk for Falls Due to: No  Fall Risks Fall risk Follow up: Falls evaluation completed  Psychosocial Psychosocial Symptoms Reported: Anxiety - if selected complete GAD, Depression - if selected complete PHQ 2-9 Additional Psychological Details: Patient reports that she takes Cymbalta  and Buspar  to help with Anxiety and depression.  Patient is also followed by [facility]. Behavioral Management Strategies: Activity, Coping strategies, Support system, Medication therapy, Adequate rest Behavioral Health Self-Management Outcome: 3 (uncertain) Major Change/Loss/Stressor/Fears (CP): Medical condition, self Techniques to Cope with Loss/Stress/Change: Counseling, Diversional activities, Medication Quality of Family Relationships: helpful Do you feel physically threatened by others?: No    12/08/2024    PHQ2-9 Depression Screening   Little interest or pleasure in doing things Nearly every day  Feeling down, depressed, or hopeless Nearly every day  PHQ-2 - Total Score 6  Trouble falling or staying asleep, or sleeping too much Several days  Feeling tired or having little energy Nearly every day (Patient reports that CPAP has helped some)  Poor appetite or overeating  More than half the days  Feeling bad about yourself - or that you are a failure or have let yourself or your family down Nearly every day  Trouble concentrating on things, such as reading the newspaper or watching television Nearly every day  Moving or speaking so slowly that other people could have noticed.  Or the opposite - being so fidgety or restless that you have been moving around a lot more than usual Not at all  Thoughts that you would be better off  dead, or hurting yourself in some way Not at all  PHQ2-9 Total Score 18  If you checked off any problems, how difficult have these problems made it for you to do your work, take care of things at home, or get along with other people Somewhat difficult  Depression Interventions/Treatment Currently on Treatment    There were no vitals filed for this visit. Pain Scale: 0-10 Pain Score: 0-No pain  Medications Reviewed Today     Reviewed by Allenmichael Mcpartlin A, RN (Case Manager) on 12/08/24 at 1435  Med List Status: <None>   Medication Order Taking? Sig Documenting Provider Last Dose Status Informant  Accu-Chek Softclix Lancets lancets [id number] Yes Use to check blood sugar 3 times daily [name], MD  Active   albuterol  (VENTOLIN  HFA) 108 (90 Base) MCG/ACT inhaler [id number] Yes Inhale 2 puffs into the lungs every 6 (six) hours as needed for wheezing or shortness of breath (Cough). [name], MD  Active Self  amLODipine  (NORVASC ) 10 MG tablet [id number] Yes Take 1 tablet (10 mg total) by mouth daily. [name], MD  Active Self  ARIPiprazole (ABILIFY) 5 MG tablet [id number] Yes Take 1 tablet (5 mg total) by mouth daily. Harl Zane BRAVO, NP  Active   atorvastatin  (LIPITOR) 40 MG tablet [id number] Yes Take 1 tablet (40 mg total) by mouth daily. [name], MD  Active Self  Blood Glucose Monitoring Suppl (ACCU-CHEK GUIDE) w/Device KIT [id number] Yes Use to check blood sugar 3 times daily. [name], MD  Active   Blood Pressure Monitoring (BLOOD PRESSURE CUFF) MISC [id number] Yes 1 each by Does not apply route daily. Please provide Large cuff Wyn Jackee VEAR Mickey., NP  Active   busPIRone  (BUSPAR ) 30 MG tablet [id number] Yes Take 1 tablet (30 mg total) by mouth 2 (two) times daily. Harl Zane BRAVO, NP  Active   Cholecalciferol  (VITAMIN D3) 10 MCG (400 UNIT) CAPS [id number] Yes Take 400 Units by mouth daily. [provider]  Active Self  Continuous Glucose  Sensor (DEXCOM G7 SENSOR) MISC [id number] Yes Change sensor every 10 days. Komal, Motwani, MD  Active Self  cycloSPORINE  (RESTASIS ) 0.05 % ophthalmic emulsion [id number] Yes Apply 1 drop into both eyes twice a day   Active Self  diclofenac  (VOLTAREN ) 50 MG EC tablet [id number] Yes Take 50 mg by mouth 2 (two) times daily as needed for mild pain (pain score 1-3). [provider]  Active Self  DULoxetine  (CYMBALTA ) 60 MG capsule [id number] Yes Take 1 capsule (60 mg total) by mouth 2 (two) times daily. Harl Zane BRAVO, NP  Active   Erenumab -aooe (AIMOVIG ) 140 MG/ML SOAJ [id number] Yes Inject 140 mg into the skin every 28 (twenty-eight) days. Skeet Juliene SAUNDERS, DO  Active Self  ezetimibe  (ZETIA ) 10 MG tablet [id number] Yes Take 1 tablet (10 mg total) by mouth daily. Tobb, Kardie, DO  Active Self  gabapentin  (NEURONTIN ) 300 MG capsule [id number] Yes Take 1 capsule (300 mg total) by mouth 3 (three) times daily. Harl Zane BRAVO, NP  Active   Glucagon  (BAQSIMI  ONE PACK) 3 MG/DOSE POWD [id number] Yes Place 1 Device into the nose as needed (Low blood sugar with impaired consciousness). [name], MD  Active Self           Med Note MARISA, NATHANEL SAILOR   Wed Oct 20, 2024  8:20 PM)    glucose blood (ACCU-CHEK GUIDE) test strip [id number] Yes Use to check blood sugar 3 times daily [name], MD  Active Self  hydrOXYzine  (ATARAX ) 50 MG tablet [id number] Yes Take 1 tablet (50 mg total) by mouth 3 (three) times daily. Harl Zane BRAVO, NP  Active   Insulin  Pen Needle (PEN NEEDLES) 31G X 5 MM MISC [id number] Yes Use to inject insulin  three times daily. [name], MD  Active   insulin  regular human CONCENTRATED (HUMULIN  R U-500 KWIKPEN) 500 UNIT/ML KwikPen [id number] Yes Inject 85-95 Units into the skin daily with breakfast AND 75-80 Units daily before lunch AND 65-70 Units daily before supper. Take 30 minutes before meals. Komal, Motwani, MD  Active   loratadine  (CLARITIN ) 10 MG tablet [id number] Yes  Take 1 tablet (10 mg total) by mouth daily as needed for allergies. [name], MD  Active Self  metoCLOPramide  (REGLAN ) 5 MG tablet [id number] Yes Take 1 tablet (5 mg total) by mouth every 8 (eight) hours as needed for nausea. Dose decrease [name], MD  Active Self  metoprolol  tartrate (LOPRESSOR ) 25 MG tablet [id number] Yes Take 1 tablet (25 mg total) by mouth 2 (two) times daily. Wyn Jackee VEAR Mickey., NP  Active Self  Multiple Vitamins-Minerals (EYE VITAMINS PO) [id number] Yes Take 1 capsule by mouth in the morning and at bedtime. [provider]  Active Self  OLANZapine -Samidorphan (LYBALVI ) 15-10 MG TABS [id number] Yes Take 15 mg by mouth at bedtime. Harl Zane BRAVO, NP  Active Self  omeprazole  (PRILOSEC) 40 MG capsule [id number] Yes TAKE 1 CAPSULE BY MOUTH ONCE DAILY 30 MINUTES BEFORE BREAKFAST Kennedy-Smith, Colleen M, NP  Active   ondansetron  (ZOFRAN -ODT) 4 MG disintegrating tablet [id number] Yes Take 1 tablet (4 mg total) by mouth every 8 (eight) hours as needed for nausea or vomiting. [name], MD  Active Self  PRESCRIPTION MEDICATION [id number] Yes CPAP- At bedtime and during any time of rest [provider]  Active Self  SUMAtriptan  (IMITREX ) 100 MG tablet [id number] Yes TAKE 1 TABLET EARLIEST ONSET OF MIGRAINE. MAY REPEAT IN 2 HOURS  IF HEADACHE PERSISTS OR RECURS. MAXIMUM 2 TABLETS IN 24 HOURS. Skeet Juliene SAUNDERS, DO  Active Self  traZODone  (DESYREL ) 150 MG tablet [id number] Yes Take 1 tablet (150 mg total) by mouth at bedtime as needed for sleep. Harl Zane BRAVO, NP  Active   TYLENOL  8 HOUR ARTHRITIS PAIN 650 MG CR tablet [id number] Yes Take 1,300 mg by mouth every 8 (eight) hours as needed for pain. [provider]  Active Self  vitamin B-12 (CYANOCOBALAMIN ) 50 MCG tablet [id number] Yes Take 50 mcg by mouth daily. [provider]  Active Self            Recommendation:   PCP Follow-up Specialty provider follow-up  :Endocrinology-12/27/24;[facility]-02/10/25; Pulmonary-02/15/25 Continue Current Plan of Care  Follow Up Plan:   Telephone follow-up in 1 month:01/26/25 @ 11 am.  Esmee Fallaw, RN, BSN, ACM RN Care Manager [employer] [phone]

## 2024-12-08 NOTE — Patient Instructions (Signed)
 Visit Information  Whitney Benton was given information about Medicaid Managed Care team care coordination services as a part of their [facility] Community Plan Medicaid benefit.   If you would like to schedule transportation through your [facility], please call the following number at least 2 days in advance of your appointment: [phone]   Rides for urgent appointments can also be made after hours by calling Member Services.  Call the Behavioral Health Crisis Line at [phone], at any time, 24 hours a day, 7 days a week. If you are in danger or need immediate medical attention call 911.  Please see education materials related to Diabetes and Asthma provided by MyChart link.  Care plan and visit instructions communicated with the patient verbally today. Patient agrees to receive a copy in MyChart. Active MyChart status and patient understanding of how to access instructions and care plan via MyChart confirmed with patient.     Telephone follow up appointment with Managed Medicaid care management team member scheduled for: 01/26/25 @ 11 am.  Lashelle Koy, RN, BSN, ACM RN Care Manager [facility] [phone]   Following is a copy of your plan of care:   Goals Addressed             This Visit's Progress    VBCI RN Care Plan-Asthma       Problems:  Chronic Disease Management support and education needs related to Asthma  Goal: Over the next 90 days the Patient will attend all scheduled medical appointments: PCP and Specialist  as evidenced by keeping all scheduled appointments.          continue to work with Medical Illustrator and/or Social Worker to address care management and care coordination needs related to Asthma as evidenced by adherence to care management team scheduled appointments     take all medications exactly as prescribed and will call provider for medication related questions as evidenced by compliance    verbalize basic  understanding of Asthma disease process and self health management plan as evidenced by verbal explanation, recognize, monitor symptoms and life style changes.   Interventions:   Asthma: Provided patient with basic written and verbal Asthma education on self care/management/and exacerbation prevention Advised patient to track and manage Asthma triggers Provided instruction about proper use of medications used for management of Asthma including inhalers Advised patient to self assesses Asthma action plan zone and make appointment with provider if in the yellow zone for 48 hours without improvement Advised patient to engage in light exercise as tolerated 3-5 days a week to aid in the the management of Asthma Provided education about and advised patient to utilize infection prevention strategies to reduce risk of respiratory infection Discussed the importance of adequate rest and management of fatigue with Asthma Screening for signs and symptoms of depression related to chronic disease state  Assessed social determinant of health barriers   Patient Self-Care Activities:  Attend all scheduled provider appointments Attend church or other social activities Call pharmacy for medication refills 3-7 days in advance of running out of medications Call provider office for new concerns or questions  Perform all self care activities independently  Perform IADL's (shopping, preparing meals, housekeeping, managing finances) independently Take medications as prescribed   Work with the social worker to address care coordination needs and will continue to work with the clinical team to address health care and disease management related needs-Follow up on resources provided by Child Psychotherapist  Plan:  Telephone  follow up appointment with care management team member scheduled for:  01/26/25 @ 11 am          VBCI RN Care Plan-DM   On track    Problems:  Chronic Disease Management support and education needs  related to DMII  Goal: Over the next 90 days the Patient will attend all scheduled medical appointments: PCP and Specialist as evidenced by keeping all schedule appointments.         continue to work with Medical Illustrator and/or Social Worker to address care management and care coordination needs related to DMII as evidenced by adherence to care management team scheduled appointments     take all medications exactly as prescribed and will call provider for medication related questions as evidenced by compliance.    verbalize basic understanding of DMII disease process and self health management plan as evidenced by verbal explanation,recognize monitor symptoms/life style changes.   Interventions:   Diabetes Interventions: Assessed patient's understanding of A1c goal: <7% Provided education to patient about basic DM disease process Reviewed medications with patient and discussed importance of medication adherence Counseled on importance of regular laboratory monitoring as prescribed Discussed plans with patient for ongoing care management follow up and provided patient with direct contact information for care management team Provided patient with written educational materials related to hypo and hyperglycemia and importance of correct treatment Referral made to social work team for assistance with Food insecurity, housing resources, and utility resources Review of patient status, including review of consultants reports, relevant laboratory and other test results, and medications completed Screening for signs and symptoms of depression related to chronic disease state  Assessed social determinant of health barriers Lab Results  Component Value Date   HGBA1C 8.9 (A) 11/11/2024    Patient Self-Care Activities:  Attend all scheduled provider appointments Call pharmacy for medication refills 3-7 days in advance of running out of medications Call provider office for new concerns or questions   Perform all self care activities independently  Take medications as prescribed   Work with the social worker to address care coordination needs and will continue to work with the clinical team to address health care and disease management related needs check blood sugar at prescribed times: before meals and at bedtime and when you have symptoms of low or high blood sugar check feet daily for cuts, sores or redness enter blood sugar readings and medication or insulin  into daily log take the blood sugar log to all doctor visits drink 6 to 8 glasses of water  each day eat fish at least once per week fill half of plate with vegetables limit fast food meals to no more than 1 per week manage portion size prepare main meal at home 3 to 5 days each week wash and dry feet carefully every day Follow up on resources provided by Child Psychotherapist  Plan:  Telephone follow up appointment with care management team member scheduled for:  01/26/25 @11  am.             Visit Information  Whitney Benton was given information about Medicaid Managed Care team care coordination services as a part of their [facility] Community Plan Medicaid benefit.   If you would like to schedule transportation through your [facility], please call the following number at least 2 days in advance of your appointment: [phone]   Rides for urgent appointments can also be made after hours by calling Member Services.  Call the [facility]  Crisis Line at [phone], at any time, 24 hours a day, 7 days a week. If you are in danger or need immediate medical attention call 911.  Please see education materials related to Diabetes and Asthma provided by MyChart link.  Care plan and visit instructions communicated with the patient verbally today. Patient agrees to receive a copy in MyChart. Active MyChart status and patient understanding of how to access instructions and care plan via MyChart confirmed  with patient.     Telephone follow up appointment with Managed Medicaid care management team member scheduled for: 01/26/25 @ 11 am.  Ervin Rothbauer, RN, BSN, ACM RN Care Manager [facility] [phone]   Following is a copy of your plan of care:   Goals Addressed             This Visit's Progress    VBCI RN Care Plan-Asthma       Problems:  Chronic Disease Management support and education needs related to Asthma  Goal: Over the next 90 days the Patient will attend all scheduled medical appointments: PCP and Specialist  as evidenced by keeping all scheduled appointments.          continue to work with Medical Illustrator and/or Social Worker to address care management and care coordination needs related to Asthma as evidenced by adherence to care management team scheduled appointments     take all medications exactly as prescribed and will call provider for medication related questions as evidenced by compliance    verbalize basic understanding of Asthma disease process and self health management plan as evidenced by verbal explanation, recognize, monitor symptoms and life style changes.   Interventions:   Asthma: Provided patient with basic written and verbal Asthma education on self care/management/and exacerbation prevention Advised patient to track and manage Asthma triggers Provided instruction about proper use of medications used for management of Asthma including inhalers Advised patient to self assesses Asthma action plan zone and make appointment with provider if in the yellow zone for 48 hours without improvement Advised patient to engage in light exercise as tolerated 3-5 days a week to aid in the the management of Asthma Provided education about and advised patient to utilize infection prevention strategies to reduce risk of respiratory infection Discussed the importance of adequate rest and management of fatigue with Asthma Screening for signs and symptoms of  depression related to chronic disease state  Assessed social determinant of health barriers   Patient Self-Care Activities:  Attend all scheduled provider appointments Attend church or other social activities Call pharmacy for medication refills 3-7 days in advance of running out of medications Call provider office for new concerns or questions  Perform all self care activities independently  Perform IADL's (shopping, preparing meals, housekeeping, managing finances) independently Take medications as prescribed   Work with the social worker to address care coordination needs and will continue to work with the clinical team to address health care and disease management related needs-Follow up on resources provided by Child Psychotherapist  Plan:  Telephone follow up appointment with care management team member scheduled for:  01/26/25 @ 11 am          VBCI RN Care Plan-DM   On track    Problems:  Chronic Disease Management support and education needs related to DMII  Goal: Over the next 90 days the Patient will attend all scheduled medical appointments: PCP and Specialist as evidenced by keeping all schedule appointments.  continue to work with Medical Illustrator and/or Social Worker to address care management and care coordination needs related to DMII as evidenced by adherence to care management team scheduled appointments     take all medications exactly as prescribed and will call provider for medication related questions as evidenced by compliance.    verbalize basic understanding of DMII disease process and self health management plan as evidenced by verbal explanation,recognize monitor symptoms/life style changes.   Interventions:   Diabetes Interventions: Assessed patient's understanding of A1c goal: <7% Provided education to patient about basic DM disease process Reviewed medications with patient and discussed importance of medication adherence Counseled on importance of regular  laboratory monitoring as prescribed Discussed plans with patient for ongoing care management follow up and provided patient with direct contact information for care management team Provided patient with written educational materials related to hypo and hyperglycemia and importance of correct treatment Referral made to social work team for assistance with Food insecurity, housing resources, and utility resources Review of patient status, including review of consultants reports, relevant laboratory and other test results, and medications completed Screening for signs and symptoms of depression related to chronic disease state  Assessed social determinant of health barriers Lab Results  Component Value Date   HGBA1C 8.9 (A) 11/11/2024    Patient Self-Care Activities:  Attend all scheduled provider appointments Call pharmacy for medication refills 3-7 days in advance of running out of medications Call provider office for new concerns or questions  Perform all self care activities independently  Take medications as prescribed   Work with the social worker to address care coordination needs and will continue to work with the clinical team to address health care and disease management related needs check blood sugar at prescribed times: before meals and at bedtime and when you have symptoms of low or high blood sugar check feet daily for cuts, sores or redness enter blood sugar readings and medication or insulin  into daily log take the blood sugar log to all doctor visits drink 6 to 8 glasses of water  each day eat fish at least once per week fill half of plate with vegetables limit fast food meals to no more than 1 per week manage portion size prepare main meal at home 3 to 5 days each week wash and dry feet carefully every day Follow up on resources provided by Child Psychotherapist  Plan:  Telephone follow up appointment with care management team member scheduled for:  01/26/25 @11  am.

## 2024-12-13 ENCOUNTER — Other Ambulatory Visit: Payer: Self-pay

## 2024-12-13 NOTE — Patient Instructions (Signed)
 Whitney Benton - I am sorry I was unable to reach you today for our scheduled appointment. I work with [name], MD and am calling to support your healthcare needs. Please contact me at [phone] at your earliest convenience. I look forward to speaking with you soon.   Thank you,   Laymon Doll, BSW [place]/VBCI - [facility] Social Worker [phone]

## 2024-12-15 ENCOUNTER — Other Ambulatory Visit: Payer: Self-pay | Admitting: Internal Medicine

## 2024-12-20 ENCOUNTER — Other Ambulatory Visit: Payer: Self-pay

## 2024-12-20 ENCOUNTER — Other Ambulatory Visit

## 2024-12-21 ENCOUNTER — Ambulatory Visit: Payer: Self-pay | Admitting: "Endocrinology

## 2024-12-21 LAB — LIPID PANEL
Cholesterol: 126 mg/dL
HDL: 42 mg/dL — ABNORMAL LOW
LDL Cholesterol (Calc): 65 mg/dL
Non-HDL Cholesterol (Calc): 84 mg/dL
Total CHOL/HDL Ratio: 3 (calc)
Triglycerides: 107 mg/dL

## 2024-12-21 LAB — COMPREHENSIVE METABOLIC PANEL WITH GFR
AG Ratio: 1.4 (calc) (ref 1.0–2.5)
ALT: 15 U/L (ref 6–29)
AST: 18 U/L (ref 10–35)
Albumin: 4.1 g/dL (ref 3.6–5.1)
Alkaline phosphatase (APISO): 135 U/L (ref 37–153)
BUN: 7 mg/dL (ref 7–25)
CO2: 30 mmol/L (ref 20–32)
Calcium: 9.5 mg/dL (ref 8.6–10.4)
Chloride: 105 mmol/L (ref 98–110)
Creat: 0.87 mg/dL (ref 0.50–1.03)
Globulin: 2.9 g/dL (ref 1.9–3.7)
Glucose, Bld: 53 mg/dL — ABNORMAL LOW (ref 65–99)
Potassium: 4.2 mmol/L (ref 3.5–5.3)
Sodium: 145 mmol/L (ref 135–146)
Total Bilirubin: 0.4 mg/dL (ref 0.2–1.2)
Total Protein: 7 g/dL (ref 6.1–8.1)
eGFR: 79 mL/min/1.73m2

## 2024-12-24 ENCOUNTER — Other Ambulatory Visit: Payer: Self-pay

## 2024-12-27 ENCOUNTER — Encounter: Payer: Self-pay | Admitting: "Endocrinology

## 2024-12-27 ENCOUNTER — Other Ambulatory Visit: Payer: Self-pay

## 2024-12-27 ENCOUNTER — Ambulatory Visit: Admitting: "Endocrinology

## 2024-12-27 VITALS — BP 130/80 | HR 83 | Ht 64.0 in | Wt 255.0 lb

## 2024-12-27 DIAGNOSIS — E7800 Pure hypercholesterolemia, unspecified: Secondary | ICD-10-CM | POA: Diagnosis not present

## 2024-12-27 DIAGNOSIS — Z794 Long term (current) use of insulin: Secondary | ICD-10-CM | POA: Diagnosis not present

## 2024-12-27 DIAGNOSIS — E1065 Type 1 diabetes mellitus with hyperglycemia: Secondary | ICD-10-CM

## 2024-12-27 MED ORDER — TIRZEPATIDE 2.5 MG/0.5ML ~~LOC~~ SOAJ: 2.5000 mg | SUBCUTANEOUS | Status: DC

## 2024-12-27 MED ORDER — TIRZEPATIDE 2.5 MG/0.5ML ~~LOC~~ SOAJ
2.5000 mg | SUBCUTANEOUS | 5 refills | Status: DC
  Filled 2024-12-27: qty 2, 28d supply, fill #0

## 2024-12-27 MED ORDER — TIRZEPATIDE 2.5 MG/0.5ML ~~LOC~~ SOAJ: 2.5000 mg | SUBCUTANEOUS | 1 refills | Status: DC

## 2024-12-27 MED ORDER — BLOOD GLUCOSE MONITOR SYSTEM W/DEVICE KIT
1.0000 | PACK | Freq: Three times a day (TID) | 0 refills | Status: AC
  Filled 2024-12-27 – 2024-12-31 (×2): qty 1, 30d supply, fill #0

## 2024-12-27 MED ORDER — BLOOD GLUCOSE TEST VI STRP
1.0000 | ORAL_STRIP | Freq: Three times a day (TID) | 3 refills | Status: AC
  Filled 2024-12-27: qty 100, 34d supply, fill #0

## 2024-12-27 MED ORDER — DEXCOM G7 15 DAY SENSOR MISC
1.0000 | 3 refills | Status: AC
  Filled 2024-12-27: qty 6, 90d supply, fill #0
  Filled 2024-12-31 – 2025-01-19 (×2): qty 2, 30d supply, fill #0

## 2024-12-27 MED ORDER — LANCET DEVICE MISC
1.0000 | Freq: Three times a day (TID) | 0 refills | Status: AC
  Filled 2024-12-27 – 2024-12-31 (×2): qty 1, 30d supply, fill #0

## 2024-12-27 MED ORDER — LANCETS MISC
1.0000 | 0 refills | Status: AC
  Filled 2024-12-27: qty 100, 25d supply, fill #0

## 2024-12-27 NOTE — Patient Instructions (Addendum)
 Will recommend the following: U500 insulin : 130 units before coffee and 75-80 units before supper, 30 min before meals (skip lunch dose)\ Start mounjaro  2.5mg /week  _________    Goals of DM therapy:  Morning Fasting blood sugar: 80-140  Blood sugar before meals: 80-140 Bed time blood sugar: 100-150  A1C <7%, limited only by hypoglycemia  1.Diabetes medications and their side effects discussed, including hypoglycemia    2. Check blood glucose:  a) Always check blood sugars before driving. Please see below (under hypoglycemia) on how to manage b) Check a minimum of 3 times/day or more as needed when having symptoms of hypoglycemia.   c) Try to check blood glucose before sleeping/in the middle of the night to ensure that it is remaining stable and not dropping less than 100 d) Check blood glucose more often if sick  3. Diet: a) 3 meals per day schedule b: Restrict carbs to 60-70 grams (4 servings) per meal c) Colorful vegetables - 3 servings a day, and low sugar fruit 2 servings/day Plate control method: 1/4 plate protein, 1/4 starch, 1/2 green, yellow, or red vegetables d) Avoid carbohydrate snacks unless hypoglycemic episode, or increased physical activity  4. Regular exercise as tolerated, preferably 3 or more hours a week  5. Hypoglycemia: a)  Do not drive or operate machinery without first testing blood glucose to assure it is over 90 mg%, or if dizzy, lightheaded, not feeling normal, etc, or  if foot or leg is numb or weak. b)  If blood glucose less than 70, take four 5gm Glucose tabs or 15-30 gm Glucose gel.  Repeat every 15 min as needed until blood sugar is >100 mg/dl. If hypoglycemia persists then call 911.   6. Sick day management: a) Check blood glucose more often b) Continue usual therapy if blood sugars are elevated.   7. Contact the doctor immediately if blood glucose is frequently <60 mg/dl, or an episode of severe hypoglycemia occurs (where someone had to give  you glucose/  glucagon  or if you passed out from a low blood glucose), or if blood glucose is persistently >350 mg/dl, for further management  8. A change in level of physical activity or exercise and a change in diet may also affect your blood sugar. Check blood sugars more often and call if needed.  Instructions: 1. Bring glucose meter, blood glucose records on every visit for review 2. Continue to follow up with primary care physician and other providers for medical care 3. Yearly eye  and foot exam 4. Please get blood work done prior to the next appointment

## 2024-12-27 NOTE — Progress Notes (Signed)
 "   Outpatient Endocrinology Note Whitney Birmingham, MD  12/27/2024   Whitney Benton [birth year] [id number]  Referring Provider: [name], MD Primary Care Provider: [name], MD Reason for consultation: Subjective   Assessment & Plan  Diagnoses and all orders for this visit:  Uncontrolled type 1 diabetes mellitus with hyperglycemia (HCC) -     Discontinue: tirzepatide  (MOUNJARO ) 2.5 MG/0.5ML Pen; Inject 2.5 mg into the skin once a week. -     tirzepatide  (MOUNJARO ) 2.5 MG/0.5ML Pen; Inject 2.5 mg into the skin once a week.  Insulin  dose changed (HCC)  Pure hypercholesterolemia  Other orders -     Continuous Glucose Sensor (DEXCOM G7 15 DAY SENSOR) MISC; Use as directed. -     Blood Glucose Monitoring Suppl DEVI; 1 each by Does not apply route in the morning, at noon, and at bedtime. May substitute to any manufacturer covered by patient's insurance. -     Glucose Blood (BLOOD GLUCOSE TEST STRIPS) STRP; 1 each by In Vitro route in the morning, at noon, and at bedtime. May substitute to any manufacturer covered by patient's insurance. -     Lancet Device MISC; 1 each by Does not apply route in the morning, at noon, and at bedtime. May substitute to any manufacturer covered by patient's insurance. -     Lancets MISC; 1 each by Does not apply route as directed. Dispense based on patient and insurance preference. Use up to four times daily as directed. (FOR ICD-10 E10.9, E11.9).   Diabetes Type I complicated by hyperglycemia, c-peptide -ve, GAD Ab + Lab Results  Component Value Date   GFR 78.96 10/09/2022   Hba1c goal less than 7, current Hba1c is  Lab Results  Component Value Date   HGBA1C 8.9 (A) 11/11/2024   Will recommend the following: U500 insulin : 130 units before coffee and 75-80 units before supper, 30 min before meals (skip lunch dose) Mounjaro  2.5mg /week, although patient was taken Benton of Trulicity  due to stomach issues, we are not sure what dose it was  at that time and patient currently needs to try a low-dose of Mounjaro  to try to decrease her coefficient of variation currently being at 48.8% on Dexcom down below acceptable levels of less than 36%  Decrease insulin  dose by 5 units if BG is <70  Does want insulin  pump  Has DexCom G7  Patient experiencing  personal life issues-extensively counseled   Stopped Metformin  XR 500 mg bid - max tolerated dose. Patient did not experience any benefit from, only GI S/E  Changed to DexCom G7 from Ross 3 per pt request as Whitney Benton, pt unable to afford skin tac Stopped Toujeo  78 units qam and Humalog  26 units tidac 15 min before meals Was taken Benton of Trulicity  due to stomach issues  No known contraindications/side effects to any of above medications Glucagon  discussed and prescribed with refills on 11/24/23  -Last LD and Tg are as follows: Lab Results  Component Value Date   LDLCALC 65 12/20/2024    Lab Results  Component Value Date   TRIG 107 12/20/2024   -On atorvastatin  40 mg every day, LDL was 105 -04/06/24: started rosuvastatin  40 mg every day -Follow low fat diet and exercise   -Blood pressure goal <140/90 - Microalbumin/creatinine goal is < 30 -Last MA/Cr is as follows: Lab Results  Component Value Date   MICROALBUR 1.1 08/24/2024   -not on ACE/ARB  -diet changes including salt restriction -limit eating  outside -counseled BP targets per standards of diabetes care -uncontrolled blood pressure can lead to retinopathy, nephropathy and cardiovascular and atherosclerotic heart disease  Reviewed and counseled on: -A1C target -Blood sugar targets -Complications of uncontrolled diabetes  -Checking blood sugar before meals and bedtime and bring log next visit -All medications with mechanism of action and side effects -Hypoglycemia management: rule of 15's, Glucagon  Emergency Kit and medical alert ID -low-carb low-fat plate-method diet -At least 20 minutes of  physical activity per day -Annual dilated retinal eye exam and foot exam -compliance and follow up needs -follow up as scheduled or earlier if problem gets worse  Call if blood sugar is less than 70 or consistently above 250    Take a 15 gm snack of carbohydrate at bedtime before you go to sleep if your blood sugar is less than 100.    If you are going to fast after midnight for a test or procedure, ask your physician for instructions on how to reduce/decrease your insulin  dose.    Call if blood sugar is less than 70 or consistently above 250  -Treating a low sugar by rule of 15  (15 gms of sugar every 15 min until sugar is more than 70) If you feel your sugar is low, test your sugar to be sure If your sugar is low (less than 70), then take 15 grams of a fast acting Carbohydrate (3-4 glucose tablets or glucose gel or 4 ounces of juice or regular soda) Recheck your sugar 15 min after treating low to make sure it is more than 70 If sugar is still less than 70, treat again with 15 grams of carbohydrate          Don't drive the hour of hypoglycemia  If unconscious/unable to eat or drink by mouth, use glucagon  injection or nasal spray baqsimi  and call 911. Can repeat again in 15 min if still unconscious.  Return in about 1 month (around 01/28/2025) for tele-visit.   I have reviewed current medications, nurse's notes, allergies, vital signs, past medical and surgical history, family medical history, and social history for this encounter. Counseled patient on symptoms, examination findings, lab findings, imaging results, treatment decisions and monitoring and prognosis. The patient understood the recommendations and agrees with the treatment plan. All questions regarding treatment plan were fully answered.  Whitney Birmingham, MD  12/27/2024    History of Present Illness Whitney Benton is a 54 y.o. year old female who presents for follow up of Type I diabetes mellitus.  Whitney Benton was first  diagnosed in 2020.   Diabetes education +  Home diabetes regimen: U500 insulin : 125 units before coffee and 80-90 units before supper, 30 min before meals (skip lunch dose)  Previously on, Toujeo  72 units qam Humalog  24 units tidac 15 min before meals Metformin  XR 500 mg bid - gets diarrhea   COMPLICATIONS -  MI/Stroke -  retinopathy -  neuropathy -  nephropathy  BLOOD SUGAR DATA CGM interpretation: At today's visit, we reviewed her CGM downloads. The full report is scanned in the media. Reviewing the CGM trends, BG are elevated morning, afternoon, late night to early overnight, with lows early morning, as well as midday to evening  Physical Exam  BP 130/80   Pulse 83   Ht 5' 4 (1.626 m)   Wt 255 lb (115.7 kg)   SpO2 95%   BMI 43.77 kg/m    Constitutional: well developed, well nourished Head: normocephalic, atraumatic Eyes: sclera  anicteric, no redness Neck: supple Lungs: normal respiratory effort Neurology: alert and oriented Skin: dry, no appreciable rashes Musculoskeletal: no appreciable defects Psychiatric: normal mood and affect Diabetic Foot Exam - Simple   No data filed      Current Medications Patient's Medications  New Prescriptions   BLOOD GLUCOSE MONITORING SUPPL DEVI    1 each by Does not apply route in the morning, at noon, and at bedtime. May substitute to any manufacturer covered by patient's insurance.   CONTINUOUS GLUCOSE SENSOR (DEXCOM G7 15 DAY SENSOR) MISC    Use as directed.   GLUCOSE BLOOD (BLOOD GLUCOSE TEST STRIPS) STRP    1 each by In Vitro route in the morning, at noon, and at bedtime. May substitute to any manufacturer covered by patient's insurance.   LANCET DEVICE MISC    1 each by Does not apply route in the morning, at noon, and at bedtime. May substitute to any manufacturer covered by patient's insurance.   LANCETS MISC    1 each by Does not apply route as directed. Dispense based on patient and insurance preference. Use up to four  times daily as directed. (FOR ICD-10 E10.9, E11.9).   TIRZEPATIDE  (MOUNJARO ) 2.5 MG/0.5ML PEN    Inject 2.5 mg into the skin once a week.  Previous Medications   ACCU-CHEK SOFTCLIX LANCETS LANCETS    Use to check blood sugar 3 times daily   ALBUTEROL  (VENTOLIN  HFA) 108 (90 BASE) MCG/ACT INHALER    Inhale 2 puffs into the lungs every 6 (six) hours as needed for wheezing or shortness of breath (Cough).   AMLODIPINE  (NORVASC ) 10 MG TABLET    Take 1 tablet by mouth once daily   ARIPIPRAZOLE (ABILIFY) 5 MG TABLET    Take 1 tablet (5 mg total) by mouth daily.   ATORVASTATIN  (LIPITOR) 40 MG TABLET    Take 1 tablet (40 mg total) by mouth daily.   BLOOD GLUCOSE MONITORING SUPPL (ACCU-CHEK GUIDE) W/DEVICE KIT    Use to check blood sugar 3 times daily.   BLOOD PRESSURE MONITORING (BLOOD PRESSURE CUFF) MISC    1 each by Does not apply route daily. Please provide Large cuff   BUSPIRONE  (BUSPAR ) 30 MG TABLET    Take 1 tablet (30 mg total) by mouth 2 (two) times daily.   CHOLECALCIFEROL  (VITAMIN D3) 10 MCG (400 UNIT) CAPS    Take 400 Units by mouth daily.   CYCLOSPORINE  (RESTASIS ) 0.05 % OPHTHALMIC EMULSION    Apply 1 drop into both eyes twice a day   DICLOFENAC  (VOLTAREN ) 50 MG EC TABLET    Take 50 mg by mouth 2 (two) times daily as needed for mild pain (pain score 1-3).   DULOXETINE  (CYMBALTA ) 60 MG CAPSULE    Take 1 capsule (60 mg total) by mouth 2 (two) times daily.   ERENUMAB -AOOE (AIMOVIG ) 140 MG/ML SOAJ    Inject 140 mg into the skin every 28 (twenty-eight) days.   EZETIMIBE  (ZETIA ) 10 MG TABLET    Take 1 tablet (10 mg total) by mouth daily.   GABAPENTIN  (NEURONTIN ) 300 MG CAPSULE    Take 1 capsule (300 mg total) by mouth 3 (three) times daily.   GLUCAGON  (BAQSIMI  ONE PACK) 3 MG/DOSE POWD    Place 1 Device into the nose as needed (Low blood sugar with impaired consciousness).   GLUCOSE BLOOD (ACCU-CHEK GUIDE) TEST STRIP    Use to check blood sugar 3 times daily   HYDROXYZINE  (ATARAX ) 50 MG TABLET  Take  1 tablet (50 mg total) by mouth 3 (three) times daily.   INSULIN  PEN NEEDLE (PEN NEEDLES) 31G X 5 MM MISC    Use to inject insulin  three times daily.   INSULIN  REGULAR HUMAN CONCENTRATED (HUMULIN  R U-500 KWIKPEN) 500 UNIT/ML KWIKPEN    Inject 85-95 Units into the skin daily with breakfast AND 75-80 Units daily before lunch AND 65-70 Units daily before supper. Take 30 minutes before meals.   LORATADINE  (CLARITIN ) 10 MG TABLET    Take 1 tablet (10 mg total) by mouth daily as needed for allergies.   METOCLOPRAMIDE  (REGLAN ) 5 MG TABLET    Take 1 tablet (5 mg total) by mouth every 8 (eight) hours as needed for nausea. Dose decrease   METOPROLOL  TARTRATE (LOPRESSOR ) 25 MG TABLET    Take 1 tablet (25 mg total) by mouth 2 (two) times daily.   MULTIPLE VITAMINS-MINERALS (EYE VITAMINS PO)    Take 1 capsule by mouth in the morning and at bedtime.   OLANZAPINE -SAMIDORPHAN (LYBALVI ) 15-10 MG TABS    Take 15 mg by mouth at bedtime.   OMEPRAZOLE  (PRILOSEC) 40 MG CAPSULE    TAKE 1 CAPSULE BY MOUTH ONCE DAILY 30 MINUTES BEFORE BREAKFAST   ONDANSETRON  (ZOFRAN -ODT) 4 MG DISINTEGRATING TABLET    Take 1 tablet (4 mg total) by mouth every 8 (eight) hours as needed for nausea or vomiting.   PRESCRIPTION MEDICATION    CPAP- At bedtime and during any time of rest   SUMATRIPTAN  (IMITREX ) 100 MG TABLET    TAKE 1 TABLET EARLIEST ONSET OF MIGRAINE. MAY REPEAT IN 2 HOURS IF HEADACHE PERSISTS OR RECURS. MAXIMUM 2 TABLETS IN 24 HOURS.   TRAZODONE  (DESYREL ) 150 MG TABLET    Take 1 tablet (150 mg total) by mouth at bedtime as needed for sleep.   TYLENOL  8 HOUR ARTHRITIS PAIN 650 MG CR TABLET    Take 1,300 mg by mouth every 8 (eight) hours as needed for pain.   VITAMIN B-12 (CYANOCOBALAMIN ) 50 MCG TABLET    Take 50 mcg by mouth daily.  Modified Medications   No medications on file  Discontinued Medications   CONTINUOUS GLUCOSE SENSOR (DEXCOM G7 SENSOR) MISC    Change sensor every 10 days.    Allergies Allergies  Allergen  Reactions   Aspirin Hives   Trulicity  [Dulaglutide ] Nausea Only and Other (See Comments)    DC'd due to chronic gastric and abdominal pain with nausea   Oxycodone  Nausea And Vomiting    Past Medical History Past Medical History:  Diagnosis Date   Allergy    Anxiety    Arthritis    Asthma    Depression    Diabetes mellitus without complication (HCC)    GERD (gastroesophageal reflux disease)    Glaucoma suspect    Hyperlipidemia    Trichomonas infection     Past Surgical History Past Surgical History:  Procedure Laterality Date   CESAREAN SECTION     UPPER GASTROINTESTINAL ENDOSCOPY     VAGINAL HYSTERECTOMY  12/23/2004   Fibroids, menorrhagia, benign pathology    Family History family history includes Colon cancer in her paternal grandmother; Colon cancer (age of onset: 41) in her father; Depression in her mother; Diabetes in her father and mother; Hypertension in her father and mother; Mental illness in her mother.  Social History Social History   Socioeconomic History   Marital status: Significant Other    Spouse name: Not on file   Number of children: 3  Years of education: 74   Highest education level: Not on file  Occupational History   Not on file  Tobacco Use   Smoking status: Former    Current packs/day: 0.00    Average packs/day: 0.5 packs/day for 13.0 years (6.5 ttl pk-yrs)    Types: Cigarettes    Start date: 2008    Quit date: 2021    Years since quitting: 5.0    Passive exposure: Past   Smokeless tobacco: Never  Vaping Use   Vaping status: Never Used  Substance and Sexual Activity   Alcohol use: Not Currently    Comment: occasional   Drug use: Yes    Types: Marijuana    Comment: occ   Sexual activity: Yes    Birth control/protection: Surgical  Other Topics Concern   Not on file  Social History Narrative   Patient lives at home with mother and father , one story    Patient has 3 children    Patient is single   Patient has 14 years of  education    Patient is right handed    Caffeine none   Social Drivers of Health   Tobacco Use: Medium Risk (12/27/2024)   Patient History    Smoking Tobacco Use: Former    Smokeless Tobacco Use: Never    Passive Exposure: Past  Physicist, Medical Strain: High Risk (10/28/2024)   Overall Financial Resource Strain (CARDIA)    Difficulty of Paying Living Expenses: Very hard  Food Insecurity: Food Insecurity Present (12/08/2024)   Epic    Worried About Programme Researcher, Broadcasting/film/video in the Last Year: Often true    Ran Out of Food in the Last Year: Often true  Transportation Needs: No Transportation Needs (12/08/2024)   Epic    Lack of Transportation (Medical): No    Lack of Transportation (Non-Medical): No  Physical Activity: Inactive (05/11/2024)   Exercise Vital Sign    Days of Exercise per Week: 0 days    Minutes of Exercise per Session: 0 min  Stress: Stress Concern Present (05/11/2024)   [employer] of Occupational Health - Occupational Stress Questionnaire    Feeling of Stress : Very much  Social Connections: Unknown (05/11/2024)   Social Connection and Isolation Panel    Frequency of Communication with Friends and Family: More than three times a week    Frequency of Social Gatherings with Friends and Family: More than three times a week    Attends Religious Services: More than 4 times per year    Active Member of Clubs or Organizations: Yes    Attends Banker Meetings: 1 to 4 times per year    Marital Status: Patient declined  Intimate Partner Violence: Not At Risk (12/08/2024)   Epic    Fear of Current or Ex-Partner: No    Emotionally Abused: No    Physically Abused: No    Sexually Abused: No  Depression (PHQ2-9): High Risk (12/08/2024)   Depression (PHQ2-9)    PHQ-2 Score: 18  Alcohol Screen: Low Risk (05/11/2024)   Alcohol Screen    Last Alcohol Screening Score (AUDIT): 0  Housing: High Risk (12/08/2024)   Epic    Unable to Pay for Housing in the Last Year:  Yes    Number of Times Moved in the Last Year: 0    Homeless in the Last Year: Yes  Utilities: At Risk (12/08/2024)   Epic    Threatened with loss of utilities: Yes  Health Literacy: Adequate Health  Literacy (05/11/2024)   B1300 Health Literacy    Frequency of need for help with medical instructions: Never    Lab Results  Component Value Date   HGBA1C 8.9 (A) 11/11/2024   HGBA1C 8.0 (A) 08/24/2024   HGBA1C 10.0 (H) 05/01/2024   Lab Results  Component Value Date   CHOL 126 12/20/2024   Lab Results  Component Value Date   HDL 42 (L) 12/20/2024   Lab Results  Component Value Date   LDLCALC 65 12/20/2024   Lab Results  Component Value Date   TRIG 107 12/20/2024   Lab Results  Component Value Date   CHOLHDL 3.0 12/20/2024   Lab Results  Component Value Date   CREATININE 0.87 12/20/2024   Lab Results  Component Value Date   GFR 78.96 10/09/2022   Lab Results  Component Value Date   MICROALBUR 1.1 08/24/2024      Component Value Date/Time   NA 145 12/20/2024 0808   NA 138 03/23/2024 0934   K 4.2 12/20/2024 0808   CL 105 12/20/2024 0808   CO2 30 12/20/2024 0808   GLUCOSE 53 (L) 12/20/2024 0808   BUN 7 12/20/2024 0808   BUN 10 03/23/2024 0934   CREATININE 0.87 12/20/2024 0808   CALCIUM  9.5 12/20/2024 0808   PROT 7.0 12/20/2024 0808   PROT 6.9 03/23/2024 0934   ALBUMIN 4.5 10/20/2024 1303   ALBUMIN 4.3 03/23/2024 0934   AST 18 12/20/2024 0808   ALT 15 12/20/2024 0808   ALKPHOS 127 (H) 10/20/2024 1303   BILITOT 0.4 12/20/2024 0808   BILITOT 0.3 03/23/2024 0934   GFRNONAA >60 10/21/2024 1224   GFRAA >60 08/10/2020 2100      Latest Ref Rng & Units 12/20/2024    8:08 AM 10/21/2024   12:24 PM 10/21/2024    7:29 AM  BMP  Glucose 65 - 99 mg/dL 53  795  833   BUN 7 - 25 mg/dL 7  12  12    Creatinine 0.50 - 1.03 mg/dL 9.12  9.04  8.97   BUN/Creat Ratio 6 - 22 (calc) SEE NOTE:     Sodium 135 - 146 mmol/L 145  140  143   Potassium 3.5 - 5.3 mmol/L 4.2  3.7   4.0   Chloride 98 - 110 mmol/L 105  104  105   CO2 20 - 32 mmol/L 30  24  28    Calcium  8.6 - 10.4 mg/dL 9.5  9.3  9.2        Component Value Date/Time   WBC 9.5 10/21/2024 0031   RBC 4.43 10/21/2024 0031   HGB 11.6 (L) 10/21/2024 0031   HGB 13.3 12/12/2022 1036   HCT 38.0 10/21/2024 0031   HCT 40.4 12/12/2022 1036   PLT 475 (H) 10/21/2024 0031   PLT 436 12/12/2022 1036   MCV 85.8 10/21/2024 0031   MCV 86 12/12/2022 1036   MCH 26.2 10/21/2024 0031   MCHC 30.5 10/21/2024 0031   RDW 15.2 10/21/2024 0031   RDW 12.9 12/12/2022 1036   LYMPHSABS 2.9 10/19/2024 0856   LYMPHSABS 2.4 07/14/2019 1058   MONOABS 0.7 10/19/2024 0856   EOSABS 0.1 10/19/2024 0856   EOSABS 0.1 07/14/2019 1058   BASOSABS 0.0 10/19/2024 0856   BASOSABS 0.0 07/14/2019 1058     Parts of this note may have been dictated using voice recognition software. There may be variances in spelling and vocabulary which are unintentional. Not all errors are proofread. Please notify the dino if any  discrepancies are noted or if the meaning of any statement is not clear.   "

## 2024-12-28 ENCOUNTER — Other Ambulatory Visit: Payer: Self-pay

## 2024-12-28 LAB — MICROALBUMIN / CREATININE URINE RATIO
Creatinine, Urine: 133 mg/dL (ref 20–275)
Microalb Creat Ratio: 118 mg/g{creat} — ABNORMAL HIGH
Microalb, Ur: 15.7 mg/dL

## 2024-12-29 ENCOUNTER — Other Ambulatory Visit: Payer: Self-pay

## 2024-12-30 ENCOUNTER — Other Ambulatory Visit: Payer: Self-pay

## 2024-12-31 ENCOUNTER — Other Ambulatory Visit: Payer: Self-pay

## 2024-12-31 ENCOUNTER — Other Ambulatory Visit (HOSPITAL_COMMUNITY): Payer: Self-pay

## 2025-01-03 ENCOUNTER — Telehealth: Payer: Self-pay

## 2025-01-03 ENCOUNTER — Other Ambulatory Visit: Payer: Self-pay

## 2025-01-03 NOTE — Telephone Encounter (Signed)
 Pt needs a PA done mounjaro 

## 2025-01-05 ENCOUNTER — Other Ambulatory Visit: Payer: Self-pay

## 2025-01-06 ENCOUNTER — Telehealth: Payer: Self-pay

## 2025-01-06 NOTE — Telephone Encounter (Signed)
 Pharmacy Patient Advocate Encounter   Received notification from Pt Calls Messages that prior authorization for Mounjaro  is required/requested.   Ozempic/Mounjaro  is approved exclusively as an adjunct to diet and exercise to improve glycemic  control in adults with type 2 diabetes mellitus. A review of patient's medical chart reveals no  documented diagnosis of type 2 diabetes. Therefore, they do not  currently meet the criteria for prior authorization of this medication.

## 2025-01-10 ENCOUNTER — Other Ambulatory Visit: Payer: Self-pay

## 2025-01-14 ENCOUNTER — Ambulatory Visit: Attending: Internal Medicine | Admitting: Internal Medicine

## 2025-01-14 ENCOUNTER — Encounter: Payer: Self-pay | Admitting: Internal Medicine

## 2025-01-14 DIAGNOSIS — E1169 Type 2 diabetes mellitus with other specified complication: Secondary | ICD-10-CM

## 2025-01-14 DIAGNOSIS — Z2821 Immunization not carried out because of patient refusal: Secondary | ICD-10-CM | POA: Diagnosis not present

## 2025-01-14 DIAGNOSIS — E785 Hyperlipidemia, unspecified: Secondary | ICD-10-CM

## 2025-01-14 DIAGNOSIS — I10 Essential (primary) hypertension: Secondary | ICD-10-CM | POA: Diagnosis not present

## 2025-01-14 DIAGNOSIS — E1059 Type 1 diabetes mellitus with other circulatory complications: Secondary | ICD-10-CM

## 2025-01-14 DIAGNOSIS — I2510 Atherosclerotic heart disease of native coronary artery without angina pectoris: Secondary | ICD-10-CM | POA: Diagnosis not present

## 2025-01-14 DIAGNOSIS — E1159 Type 2 diabetes mellitus with other circulatory complications: Secondary | ICD-10-CM | POA: Diagnosis not present

## 2025-01-14 NOTE — Patient Instructions (Signed)
" °  VISIT SUMMARY: Whitney Benton, a 54 year old female with diabetes, hyperlipidemia, and hypertension, visited for a follow-up on her chronic conditions. She manages her diabetes with insulin  injections and uses a continuous glucose monitor. Her hyperlipidemia and hypertension are controlled with medications. She is also addressing her diet and physical activity levels. She declined the shingles vaccination.  YOUR PLAN: -TYPE 1 DIABETES MELLITUS WITH MORBID OBESITY: Type 1 diabetes is a condition where your body does not produce insulin , requiring you to take insulin  injections. Your current insulin  regimen will continue, and you are encouraged to control portions and avoid sugary drinks and snacks. Regular physical activity is recommended, starting with 10 minutes of walking and gradually increasing. You should contact an endocrinologist to discuss the possibility of starting Mounjaro  and adjusting your insulin .  -HYPERTENSION ASSOCIATED WITH TYPE 1 DIABETES MELLITUS: Hypertension is high blood pressure. Your blood pressure is slightly elevated, but no changes to your current medications are needed. Continue taking amlodipine  10 mg daily and metoprolol  25 mg twice daily. Limit salt intake in your diet.  -HYPERLIPIDEMIA: Hyperlipidemia is high cholesterol. Your LDL cholesterol level is at the target with your current medications. Continue taking atorvastatin  40 mg daily and Zetia  10 mg daily.  -HERPES ZOSTER VACCINATION DECLINED: You declined the shingles vaccination, and this has been documented.  INSTRUCTIONS: Please contact your endocrinologist to discuss the initiation of Mounjaro  and any necessary adjustments to your insulin  regimen. Continue with your current medications and lifestyle recommendations. Follow up with your primary care provider as needed.    Contains text generated by [employer]bridge.   "

## 2025-01-14 NOTE — Progress Notes (Signed)
 "   Patient ID: Whitney Benton, female    DOB: Jul 09, 1971  MRN: [id number]  CC: Diabetes (DM f/u. /No questions / concerns/No to flu vax)   Subjective: Whitney Benton is a 54 y.o. female who presents for chronic ds management. Her chronic medical issues include:  Pt with hx of depression, migraines (Dr. Skeet), mixed incontinence, DM type 1 with mild gastroparesis on gastric emptying study, obesity, HL, vit D def, former smoker, on metoprolol  for tachycardia, mild nonobstructive CAD with calcium  score of 1.  Started on Norvasc  for possible vasospasms   Discussed the use of AI scribe software for clinical note transcription with the patient, who gave verbal consent to proceed.  History of Present Illness Whitney Benton is a 54 year old female with diabetes, hyperlipidemia, and hypertension who presents for follow-up of her chronic medical conditions.  DM: Lab Results  Component Value Date   HGBA1C 8.9 (A) 11/11/2024  Saw Dr. Dartha in f/u 12/27/2024. She manages her diabetes with Humulin  R U-500 insulin  injections twice daily (written as TID but reports her endocrinologist cut it back to 2x/day), before breakfast and dinner. She has difficulty remembering the exact dosage. She was started Mounjaro  by endo this month but pt has not started it as yet due to concerns about hypoglycemia, as her blood sugar drops with her current medication. She uses a continuous glucose monitor, which shows her time in range over the past 14 days is 46%, with 23% high, 27% very high, 2% low, and 2% very low.  She is making efforts to control her diet by limiting portion sizes and avoiding sugary drinks and snacks, although she admits to occasional lapses. She acknowledges the need to increase physical activity but has not been exercising regularly.  HL/non-obstructive CAD on coronary CT: Her hyperlipidemia was managed with atorvastatin  40 mg daily. Zetia  10 mg daily was added by cardiologist Dr. Sheena to get LDL at goal.  Her most recent LDL cholesterol was 65 on both meds.  Hypertension is managed with amlodipine  10 mg daily and metoprolol  25 mg twice daily. Her blood pressure readings have varied, with a recent reading of 133/85. She confirms taking her medications as prescribed.  She has a history of depression and anxiety and remains connected with her mental health provider.  HM: decline Zoster vaccine    Patient Active Problem List   Diagnosis Date Noted   Hyperglycemia 10/20/2024   Gastroesophageal reflux disease 10/20/2024   Right carpal tunnel syndrome 07/30/2024   Left carpal tunnel syndrome 07/30/2024   Type 2 diabetes mellitus with hyperglycemia, with long-term current use of insulin  (HCC) 04/30/2024   Intractable nausea and vomiting 12/23/2023   Diabetic gastroparesis (HCC) 12/22/2023   DKA, type 1 (HCC) 06/20/2023   Essential hypertension 06/20/2023   Vitamin D  deficiency 06/20/2023   Hyperlipidemia 03/06/2023   Primary insomnia 12/27/2022   Gallstone pancreatitis 12/03/2022   Chronic cholecystitis with calculus 12/03/2022   Morbid obesity (HCC) 04/04/2022   Former smoker 04/30/2021   Major depressive disorder, recurrent episode, moderate (HCC) 04/30/2021   Substance induced mood disorder (HCC) 03/22/2021   Generalized anxiety disorder 03/22/2021   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   DKA (diabetic ketoacidosis) (HCC) 01/23/2021   Glaucoma suspect 04/12/2020   DKA, type 2, not at goal [facility]) 10/02/2019   AKI (acute kidney injury) 10/02/2019   Hyperkalemia 10/02/2019   Leukocytosis 10/02/2019   Abnormal LFTs 10/02/2019   Stressful life events affecting family and household 08/06/2019  New onset type 2 diabetes mellitus (HCC) 02/04/2019   Morbid obesity with BMI of 40.0-44.9, adult (HCC) 02/04/2019   Tobacco dependence 02/04/2019   Left arm weakness 12/06/2014   Paresthesias/numbness 12/06/2014   Tobacco abuse 11/14/2014   Anxiety and depression    Mixed  incontinence 06/27/2014   History of TVH in 2006 for fibroids and menorrhagia; benign pathology 09/08/2011     Medications Ordered Prior to Encounter[1]  Allergies[2]  Social History   Socioeconomic History   Marital status: Significant Other    Spouse name: Not on file   Number of children: 3   Years of education: 14   Highest education level: Not on file  Occupational History   Not on file  Tobacco Use   Smoking status: Former    Current packs/day: 0.00    Average packs/day: 0.5 packs/day for 13.0 years (6.5 ttl pk-yrs)    Types: Cigarettes    Start date: 2008    Quit date: 2021    Years since quitting: 5.0    Passive exposure: Past   Smokeless tobacco: Never  Vaping Use   Vaping status: Never Used  Substance and Sexual Activity   Alcohol use: Not Currently    Comment: occasional   Drug use: Yes    Types: Marijuana    Comment: occ   Sexual activity: Yes    Birth control/protection: Surgical  Other Topics Concern   Not on file  Social History Narrative   Patient lives at home with mother and father , one story    Patient has 3 children    Patient is single   Patient has 14 years of education    Patient is right handed    Caffeine none   Social Drivers of Health   Tobacco Use: Medium Risk (01/14/2025)   Patient History    Smoking Tobacco Use: Former    Smokeless Tobacco Use: Never    Passive Exposure: Past  Physicist, Medical Strain: High Risk (10/28/2024)   Overall Financial Resource Strain (CARDIA)    Difficulty of Paying Living Expenses: Very hard  Food Insecurity: Food Insecurity Present (12/08/2024)   Epic    Worried About Programme Researcher, Broadcasting/film/video in the Last Year: Often true    Ran Out of Food in the Last Year: Often true  Transportation Needs: No Transportation Needs (12/08/2024)   Epic    Lack of Transportation (Medical): No    Lack of Transportation (Non-Medical): No  Physical Activity: Inactive (05/11/2024)   Exercise Vital Sign    Days of  Exercise per Week: 0 days    Minutes of Exercise per Session: 0 min  Stress: Stress Concern Present (05/11/2024)   [employer] of Occupational Health - Occupational Stress Questionnaire    Feeling of Stress : Very much  Social Connections: Unknown (05/11/2024)   Social Connection and Isolation Panel    Frequency of Communication with Friends and Family: More than three times a week    Frequency of Social Gatherings with Friends and Family: More than three times a week    Attends Religious Services: More than 4 times per year    Active Member of [employer] or Organizations: Yes    Attends Banker Meetings: 1 to 4 times per year    Marital Status: Patient declined  Intimate Partner Violence: Not At Risk (12/08/2024)   Epic    Fear of Current or Ex-Partner: No    Emotionally Abused: No    Physically Abused: No  Sexually Abused: No  Depression (PHQ2-9): High Risk (12/08/2024)   Depression (PHQ2-9)    PHQ-2 Score: 18  Alcohol Screen: Low Risk (05/11/2024)   Alcohol Screen    Last Alcohol Screening Score (AUDIT): 0  Housing: High Risk (12/08/2024)   Epic    Unable to Pay for Housing in the Last Year: Yes    Number of Times Moved in the Last Year: 0    Homeless in the Last Year: Yes  Utilities: At Risk (12/08/2024)   Epic    Threatened with loss of utilities: Yes  Health Literacy: Adequate Health Literacy (05/11/2024)   B1300 Health Literacy    Frequency of need for help with medical instructions: Never    Family History  Problem Relation Age of Onset   Diabetes Mother    Mental illness Mother    Depression Mother    Hypertension Mother    Colon cancer Father 68   Hypertension Father    Diabetes Father    Colon cancer Paternal Grandmother    Esophageal cancer Neg Hx    Stomach cancer Neg Hx    Rectal cancer Neg Hx    Asthma Neg Hx    Breast cancer Neg Hx     Past Surgical History:  Procedure Laterality Date   CESAREAN SECTION     UPPER GASTROINTESTINAL  ENDOSCOPY     VAGINAL HYSTERECTOMY  12/23/2004   Fibroids, menorrhagia, benign pathology    ROS: Review of Systems Negative except as stated above  PHYSICAL EXAM: BP 130/83   Pulse (!) 115   Temp 98.5 F (36.9 C) (Oral)   Ht 5' 4 (1.626 m)   Wt 256 lb (116.1 kg)   SpO2 97%   BMI 43.94 kg/m   Wt Readings from Last 3 Encounters:  01/14/25 256 lb (116.1 kg)  12/27/24 255 lb (115.7 kg)  11/29/24 260 lb (117.9 kg)    Physical Exam  General appearance - alert, well appearing, morbidly obese middle age AAF and in no distress Mental status - normal mood, behavior, speech, dress, motor activity, and thought processes Chest - clear to auscultation, no wheezes, rales or rhonchi, symmetric air entry Heart - normal rate, regular rhythm, normal S1, S2, no murmurs, rubs, clicks or gallops Extremities - peripheral pulses normal, no pedal edema, no clubbing or cyanosis      Latest Ref Rng & Units 12/20/2024    8:08 AM 10/21/2024   12:24 PM 10/21/2024    7:29 AM  CMP  Glucose 65 - 99 mg/dL 53  795  833   BUN 7 - 25 mg/dL 7  12  12    Creatinine 0.50 - 1.03 mg/dL 9.12  9.04  8.97   Sodium 135 - 146 mmol/L 145  140  143   Potassium 3.5 - 5.3 mmol/L 4.2  3.7  4.0   Chloride 98 - 110 mmol/L 105  104  105   CO2 20 - 32 mmol/L 30  24  28    Calcium  8.6 - 10.4 mg/dL 9.5  9.3  9.2   Total Protein 6.1 - 8.1 g/dL 7.0     Total Bilirubin 0.2 - 1.2 mg/dL 0.4     AST 10 - 35 U/L 18     ALT 6 - 29 U/L 15      Lipid Panel     Component Value Date/Time   CHOL 126 12/20/2024 0808   CHOL 179 03/23/2024 0934   TRIG 107 12/20/2024 0808   HDL 42 (L)  12/20/2024 0808   HDL 44 03/23/2024 0934   CHOLHDL 3.0 12/20/2024 0808   VLDL 19 11/14/2014 0707   LDLCALC 65 12/20/2024 0808    CBC    Component Value Date/Time   WBC 9.5 10/21/2024 0031   RBC 4.43 10/21/2024 0031   HGB 11.6 (L) 10/21/2024 0031   HGB 13.3 12/12/2022 1036   HCT 38.0 10/21/2024 0031   HCT 40.4 12/12/2022 1036   PLT 475  (H) 10/21/2024 0031   PLT 436 12/12/2022 1036   MCV 85.8 10/21/2024 0031   MCV 86 12/12/2022 1036   MCH 26.2 10/21/2024 0031   MCHC 30.5 10/21/2024 0031   RDW 15.2 10/21/2024 0031   RDW 12.9 12/12/2022 1036   LYMPHSABS 2.9 10/19/2024 0856   LYMPHSABS 2.4 07/14/2019 1058   MONOABS 0.7 10/19/2024 0856   EOSABS 0.1 10/19/2024 0856   EOSABS 0.1 07/14/2019 1058   BASOSABS 0.0 10/19/2024 0856   BASOSABS 0.0 07/14/2019 1058    ASSESSMENT AND PLAN: 1. Type 1 diabetes mellitus with morbid obesity (HCC) (Primary) Not at goal. On Humulin  N U-500 and followed by endocrinology.  She reports blood sugar readings blood sugar readings intermittently causing failure of her starting the low dose Mounjaro .  Advised that she touch base with Dr. Motwani for advice on insulin  changes since she could not tell me today what dose she is currently taking.  Commended her on changes that she has made in her eating habits so far. Encouraged her to start incorporating some exercise activities at least 3 to 4 days a week.  She can start 10 minutes per session and gradually build up to 30 minutes.  2. Hyperlipidemia associated with type 2 diabetes mellitus (HCC) 3. Atherosclerosis of native coronary artery of native heart without angina pectoris LDL is at goal.  Continue atorvastatin  40 mg daily and Zetia  10 mg daily.  Continue metoprolol  25 mg twice a day.  Noted to be a little tachycardic today.  TSH 9 months ago was normal.  4. Hypertension associated with type 1 diabetes mellitus (HCC) Repeat blood pressure today closer to goal.  Continue Norvasc  10 mg daily and metoprolol  25 mg twice a day.  5. Herpes zoster vaccination declined Recommended.  Patient declined.   Patient was given the opportunity to ask questions.  Patient verbalized understanding of the plan and was able to repeat key elements of the plan.   This documentation was completed using Paediatric nurse.  Any transcriptional  errors are unintentional.  No orders of the defined types were placed in this encounter.    Requested Prescriptions    No prescriptions requested or ordered in this encounter    No follow-ups on file.  Barnie Louder, MD, FACP     [1]  Current Outpatient Medications on File Prior to Visit  Medication Sig Dispense Refill   Accu-Chek Softclix Lancets lancets Use to check blood sugar 3 times daily 100 each 6   albuterol  (VENTOLIN  HFA) 108 (90 Base) MCG/ACT inhaler Inhale 2 puffs into the lungs every 6 (six) hours as needed for wheezing or shortness of breath (Cough). 18 g 0   amLODipine  (NORVASC ) 10 MG tablet Take 1 tablet by mouth once daily 90 tablet 0   atorvastatin  (LIPITOR) 40 MG tablet Take 1 tablet (40 mg total) by mouth daily. 90 tablet 2   Blood Glucose Monitoring Suppl (ACCU-CHEK GUIDE) w/Device KIT Use to check blood sugar 3 times daily. 1 kit 0   Blood Glucose Monitoring Suppl (  BLOOD GLUCOSE MONITOR SYSTEM) w/Device KIT Testing in the morning, at noon, and at bedtime. 1 kit 0   Blood Pressure Monitoring (BLOOD PRESSURE CUFF) MISC 1 each by Does not apply route daily. Please provide Large cuff 1 each 0   busPIRone  (BUSPAR ) 30 MG tablet Take 1 tablet (30 mg total) by mouth 2 (two) times daily. 180 tablet 1   Cholecalciferol  (VITAMIN D3) 10 MCG (400 UNIT) CAPS Take 400 Units by mouth daily.     Continuous Glucose Sensor (DEXCOM G7 15 DAY SENSOR) MISC Use as directed. 6 each 3   cycloSPORINE  (RESTASIS ) 0.05 % ophthalmic emulsion Apply 1 drop into both eyes twice a day 180 each 3   diclofenac  (VOLTAREN ) 50 MG EC tablet Take 50 mg by mouth 2 (two) times daily as needed for mild pain (pain score 1-3).     DULoxetine  (CYMBALTA ) 60 MG capsule Take 1 capsule (60 mg total) by mouth 2 (two) times daily. 180 capsule 1   Erenumab -aooe (AIMOVIG ) 140 MG/ML SOAJ Inject 140 mg into the skin every 28 (twenty-eight) days. 1.12 mL 11   gabapentin  (NEURONTIN ) 300 MG capsule Take 1 capsule (300  mg total) by mouth 3 (three) times daily. 270 capsule 1   Glucagon  (BAQSIMI  ONE PACK) 3 MG/DOSE POWD Place 1 Device into the nose as needed (Low blood sugar with impaired consciousness). 2 each 3   glucose blood (ACCU-CHEK GUIDE) test strip Use to check blood sugar 3 times daily 100 each 6   Glucose Blood (BLOOD GLUCOSE TEST STRIPS) STRP Testing in the morning, at noon, and at bedtime. 100 each 3   hydrOXYzine  (ATARAX ) 50 MG tablet Take 1 tablet (50 mg total) by mouth 3 (three) times daily. 270 tablet 1   Insulin  Pen Needle (PEN NEEDLES) 31G X 5 MM MISC Use to inject insulin  three times daily. 100 each 6   insulin  regular human CONCENTRATED (HUMULIN  R U-500 KWIKPEN) 500 UNIT/ML KwikPen Inject 85-95 Units into the skin daily with breakfast AND 75-80 Units daily before lunch AND 65-70 Units daily before supper. Take 30 minutes before meals. 12 mL 3   Lancet Device MISC 1 each by Does not apply route in the morning, at noon, and at bedtime. May substitute to any manufacturer covered by patient's insurance. 1 each 0   Lancets MISC Use up to four times daily as directed. (FOR ICD-10 E10.9, E11.9). 100 each 0   loratadine  (CLARITIN ) 10 MG tablet Take 1 tablet (10 mg total) by mouth daily as needed for allergies. 30 tablet 3   metoCLOPramide  (REGLAN ) 5 MG tablet Take 1 tablet (5 mg total) by mouth every 8 (eight) hours as needed for nausea. Dose decrease 90 tablet 3   metoprolol  tartrate (LOPRESSOR ) 25 MG tablet Take 1 tablet (25 mg total) by mouth 2 (two) times daily. 60 tablet 5   Multiple Vitamins-Minerals (EYE VITAMINS PO) Take 1 capsule by mouth in the morning and at bedtime.     OLANZapine -Samidorphan (LYBALVI ) 15-10 MG TABS Take 15 mg by mouth at bedtime. 30 tablet 3   omeprazole  (PRILOSEC) 40 MG capsule TAKE 1 CAPSULE BY MOUTH ONCE DAILY 30 MINUTES BEFORE BREAKFAST 90 capsule 0   ondansetron  (ZOFRAN -ODT) 4 MG disintegrating tablet Take 1 tablet (4 mg total) by mouth every 8 (eight) hours as needed  for nausea or vomiting. 20 tablet 0   PRESCRIPTION MEDICATION CPAP- At bedtime and during any time of rest     SUMAtriptan  (IMITREX ) 100 MG tablet TAKE 1  TABLET EARLIEST ONSET OF MIGRAINE. MAY REPEAT IN 2 HOURS IF HEADACHE PERSISTS OR RECURS. MAXIMUM 2 TABLETS IN 24 HOURS. 10 tablet 5   tirzepatide  (MOUNJARO ) 2.5 MG/0.5ML Pen Inject 2.5 mg into the skin once a week. 2 mL 5   tirzepatide  (MOUNJARO ) 2.5 MG/0.5ML Pen Inject 2.5 mg into the skin once a week.     traZODone  (DESYREL ) 150 MG tablet Take 1 tablet (150 mg total) by mouth at bedtime as needed for sleep. 90 tablet 1   TYLENOL  8 HOUR ARTHRITIS PAIN 650 MG CR tablet Take 1,300 mg by mouth every 8 (eight) hours as needed for pain.     vitamin B-12 (CYANOCOBALAMIN ) 50 MCG tablet Take 50 mcg by mouth daily.     ARIPiprazole (ABILIFY) 5 MG tablet Take 1 tablet (5 mg total) by mouth daily. (Patient not taking: Reported on 01/14/2025) 90 tablet 1   ezetimibe  (ZETIA ) 10 MG tablet Take 1 tablet (10 mg total) by mouth daily. (Patient not taking: Reported on 01/14/2025) 90 tablet 3   No current facility-administered medications on file prior to visit.  [2]  Allergies Allergen Reactions   Aspirin Hives   Trulicity  [Dulaglutide ] Nausea Only and Other (See Comments)    DC'd due to chronic gastric and abdominal pain with nausea   Oxycodone  Nausea And Vomiting   "

## 2025-01-19 ENCOUNTER — Other Ambulatory Visit: Payer: Self-pay

## 2025-01-19 ENCOUNTER — Other Ambulatory Visit: Payer: Self-pay | Admitting: Internal Medicine

## 2025-01-19 DIAGNOSIS — E119 Type 2 diabetes mellitus without complications: Secondary | ICD-10-CM

## 2025-01-19 MED ORDER — INSULIN PEN NEEDLE 31G X 6 MM MISC
6 refills | Status: AC
  Filled 2025-01-19: qty 100, 30d supply, fill #0

## 2025-01-25 ENCOUNTER — Other Ambulatory Visit: Payer: Self-pay

## 2025-01-26 ENCOUNTER — Other Ambulatory Visit: Payer: Self-pay | Admitting: *Deleted

## 2025-01-26 ENCOUNTER — Other Ambulatory Visit: Payer: Self-pay

## 2025-01-26 NOTE — Patient Instructions (Signed)
 Whitney Benton - I am sorry you where unable to complete outreach today for our scheduled appointment.  I have rescheduled your appointment for 03/10/25 @ 1:30 pm. I work with [name], MD and am calling to support your healthcare needs. Please contact me at [phone] at your earliest convenience. I look forward to speaking with you soon.   Thank you,  Jeramy Dimmick, RN, BSN, ACM RN Care Manager [employer] [phone]

## 2025-01-27 ENCOUNTER — Other Ambulatory Visit: Payer: Self-pay

## 2025-01-27 ENCOUNTER — Other Ambulatory Visit (HOSPITAL_COMMUNITY): Payer: Self-pay

## 2025-01-28 ENCOUNTER — Telehealth: Admitting: "Endocrinology

## 2025-01-28 ENCOUNTER — Other Ambulatory Visit: Payer: Self-pay

## 2025-01-28 ENCOUNTER — Encounter: Payer: Self-pay | Admitting: "Endocrinology

## 2025-01-28 DIAGNOSIS — E7800 Pure hypercholesterolemia, unspecified: Secondary | ICD-10-CM

## 2025-01-28 DIAGNOSIS — E1065 Type 1 diabetes mellitus with hyperglycemia: Secondary | ICD-10-CM

## 2025-01-28 DIAGNOSIS — Z794 Long term (current) use of insulin: Secondary | ICD-10-CM

## 2025-01-28 MED ORDER — TIRZEPATIDE 5 MG/0.5ML ~~LOC~~ SOAJ
5.0000 mg | SUBCUTANEOUS | 0 refills | Status: AC
  Filled 2025-01-28: qty 2, 28d supply, fill #0

## 2025-01-28 NOTE — Patient Instructions (Signed)
 Will recommend the following: U500 insulin : 110 units before coffee (100 units of blood sugar is less than 120, 90 units if less than 100, 80 units if less than 80, hold if less than 70) and 115 units before supper, 30 min before meals (skip lunch dose) Mounjaro  2.5mg /week->5 mg after 4 weeks  _________________    Goals of DM therapy:  Morning Fasting blood sugar: 80-140  Blood sugar before meals: 80-140 Bed time blood sugar: 100-150  A1C <7%, limited only by hypoglycemia  1.Diabetes medications and their side effects discussed, including hypoglycemia    2. Check blood glucose:  a) Always check blood sugars before driving. Please see below (under hypoglycemia) on how to manage b) Check a minimum of 3 times/day or more as needed when having symptoms of hypoglycemia.   c) Try to check blood glucose before sleeping/in the middle of the night to ensure that it is remaining stable and not dropping less than 100 d) Check blood glucose more often if sick  3. Diet: a) 3 meals per day schedule b: Restrict carbs to 60-70 grams (4 servings) per meal c) Colorful vegetables - 3 servings a day, and low sugar fruit 2 servings/day Plate control method: 1/4 plate protein, 1/4 starch, 1/2 green, yellow, or red vegetables d) Avoid carbohydrate snacks unless hypoglycemic episode, or increased physical activity  4. Regular exercise as tolerated, preferably 3 or more hours a week  5. Hypoglycemia: a)  Do not drive or operate machinery without first testing blood glucose to assure it is over 90 mg%, or if dizzy, lightheaded, not feeling normal, etc, or  if foot or leg is numb or weak. b)  If blood glucose less than 70, take four 5gm Glucose tabs or 15-30 gm Glucose gel.  Repeat every 15 min as needed until blood sugar is >100 mg/dl. If hypoglycemia persists then call 911.   6. Sick day management: a) Check blood glucose more often b) Continue usual therapy if blood sugars are elevated.   7.  Contact the doctor immediately if blood glucose is frequently <60 mg/dl, or an episode of severe hypoglycemia occurs (where someone had to give you glucose/  glucagon  or if you passed out from a low blood glucose), or if blood glucose is persistently >350 mg/dl, for further management  8. A change in level of physical activity or exercise and a change in diet may also affect your blood sugar. Check blood sugars more often and call if needed.  Instructions: 1. Bring glucose meter, blood glucose records on every visit for review 2. Continue to follow up with primary care physician and other providers for medical care 3. Yearly eye  and foot exam 4. Please get blood work done prior to the next appointment

## 2025-01-28 NOTE — Progress Notes (Signed)
 "  The patient reports they are currently: Whitney Benton. I spent 10-11 minutes on the video with the patient on the date of service. I spent an additional 2 minutes on pre- and post-visit activities on the date of service.   The patient was physically located in [place]  or a state in which I am permitted to provide care. The patient and/or parent/guardian understood that s/he may incur co-pays and cost sharing, and agreed to the telemedicine visit. The visit was reasonable and appropriate under the circumstances given the patient's presentation at the time.  The patient and/or parent/guardian understands the potential risks and limitations of this mode of treatment (including, but not limited to, the absence of in-person examination) and has agreed to be treated using telemedicine. The patient's/patient's family's questions regarding telemedicine have been answered.   The patient and/or parent/guardian will contact their provider's office for worsening conditions, and seek emergency medical treatment and/or call 911 if the patient deems either necessary.    Outpatient Endocrinology Note Whitney Birmingham, MD  01/28/25   Whitney Benton 07-Feb-1971 [id number]  Referring Provider: [name], MD Primary Care Provider: [name], MD Reason for consultation: Subjective   Assessment & Plan  Whitney Benton was seen today for diabetes.  Diagnoses and all orders for this visit:  Uncontrolled type 1 diabetes mellitus with hyperglycemia (HCC)  Insulin  dose changed (HCC)  Pure hypercholesterolemia  Other orders -     tirzepatide  (MOUNJARO ) 5 MG/0.5ML Pen; Inject 5 mg into the skin once a week.    Diabetes Type I complicated by hyperglycemia, c-peptide -ve, GAD Ab +  Lab Results  Component Value Date   GFR 78.96 10/09/2022   Hba1c goal less than 7, current Hba1c is  Lab Results  Component Value Date   HGBA1C 8.9 (A) 11/11/2024   Will recommend the following: U500 insulin : 110  units before coffee (100 units of blood sugar is less than 120, 90 units if less than 100, 80 units if less than 80) and 115 units before supper, 30 min before meals (skip lunch dose) Mounjaro  2.5mg /week->5 mg after 4 weeks  Patient was taken off of Trulicity  due to stomach issues, we are not sure what dose it was at that time   Decrease insulin  dose by 5 units if BG is <70  Does want insulin  pump  Has DexCom G7  Patient experiencing  personal life issues-extensively counseled   Stopped Metformin  XR 500 mg bid - max tolerated dose. Patient did not experience any benefit from, only GI S/E  Changed to DexCom G7 from [place] 3 per pt request as herlene keeps falling off, pt unable to afford skin tac Stopped Toujeo  78 units qam and Humalog  26 units tidac 15 min before meals Was taken off of Trulicity  due to stomach issues  No known contraindications/side effects to any of above medications Glucagon  discussed and prescribed with refills on 11/24/23  -Last LD and Tg are as follows: Lab Results  Component Value Date   LDLCALC 65 12/20/2024    Lab Results  Component Value Date   TRIG 107 12/20/2024   -On atorvastatin  40 mg every day, LDL was 105 -04/06/24: started rosuvastatin  40 mg every day, LDL is 65 -Follow low fat diet and exercise   -Blood pressure goal <140/90 - Microalbumin/creatinine goal is < 30 -Last MA/Cr is as follows: Lab Results  Component Value Date   MICROALBUR 15.7 12/27/2024   -not on ACE/ARB  -diet changes including salt restriction -limit eating  outside -counseled BP targets per standards of diabetes care -uncontrolled blood pressure can lead to retinopathy, nephropathy and cardiovascular and atherosclerotic heart disease  Reviewed and counseled on: -A1C target -Blood sugar targets -Complications of uncontrolled diabetes  -Checking blood sugar before meals and bedtime and bring log next visit -All medications with mechanism of action and side  effects -Hypoglycemia management: rule of 15's, Glucagon  Emergency Kit and medical alert ID -low-carb low-fat plate-method diet -At least 20 minutes of physical activity per day -Annual dilated retinal eye exam and foot exam -compliance and follow up needs -follow up as scheduled or earlier if problem gets worse  Call if blood sugar is less than 70 or consistently above 250    Take a 15 gm snack of carbohydrate at bedtime before you go to sleep if your blood sugar is less than 100.    If you are going to fast after midnight for a test or procedure, ask your physician for instructions on how to reduce/decrease your insulin  dose.    Call if blood sugar is less than 70 or consistently above 250  -Treating a low sugar by rule of 15  (15 gms of sugar every 15 min until sugar is more than 70) If you feel your sugar is low, test your sugar to be sure If your sugar is low (less than 70), then take 15 grams of a fast acting Carbohydrate (3-4 glucose tablets or glucose gel or 4 ounces of juice or regular soda) Recheck your sugar 15 min after treating low to make sure it is more than 70 If sugar is still less than 70, treat again with 15 grams of carbohydrate          Don't drive the hour of hypoglycemia  If unconscious/unable to eat or drink by mouth, use glucagon  injection or nasal spray baqsimi  and call 911. Can repeat again in 15 min if still unconscious.  Return in about 4 weeks (around 02/25/2025) for tele-visit.   I have reviewed current medications, nurse's notes, allergies, vital signs, past medical and surgical history, family medical history, and social history for this encounter. Counseled patient on symptoms, examination findings, lab findings, imaging results, treatment decisions and monitoring and prognosis. The patient understood the recommendations and agrees with the treatment plan. All questions regarding treatment plan were fully answered.  Whitney Birmingham, MD   01/28/25    History of Present Illness [name] is a 54 y.o. year old female who presents for follow up of Type I diabetes mellitus.  Whitney Benton was first diagnosed in 2020.   Diabetes education +  Home diabetes regimen: U500 insulin : 80-100 units before coffee at 6:30-7am and 115 units before supper at 6-7pm, 30 min before meals (skip lunch dose) Mounjaro  2.5mg /week  Previously on, Toujeo  72 units qam Humalog  24 units tidac 15 min before meals Metformin  XR 500 mg bid - gets diarrhea   COMPLICATIONS -  MI/Stroke -  retinopathy -  neuropathy -  nephropathy  BLOOD SUGAR DATA         CGM interpretation: At today's visit, we reviewed her CGM downloads. The full report is scanned in the media. Reviewing the CGM trends, BG are elevated in the afternoon and low in morning and evening.  Physical Exam  There were no vitals taken for this visit.   Constitutional: well developed, well nourished Head: normocephalic, atraumatic Eyes: sclera anicteric, no redness Neck: supple Lungs: normal respiratory effort Neurology: alert and oriented Skin: dry, no  appreciable rashes Musculoskeletal: no appreciable defects Psychiatric: normal mood and affect Diabetic Foot Exam - Simple   No data filed      Current Medications Patient's Medications  New Prescriptions   TIRZEPATIDE  (MOUNJARO ) 5 MG/0.5ML PEN    Inject 5 mg into the skin once a week.  Previous Medications   ACCU-CHEK SOFTCLIX LANCETS LANCETS    Use to check blood sugar 3 times daily   ALBUTEROL  (VENTOLIN  HFA) 108 (90 BASE) MCG/ACT INHALER    Inhale 2 puffs into the lungs every 6 (six) hours as needed for wheezing or shortness of breath (Cough).   AMLODIPINE  (NORVASC ) 10 MG TABLET    Take 1 tablet by mouth once daily   ARIPIPRAZOLE (ABILIFY) 5 MG TABLET    Take 1 tablet (5 mg total) by mouth daily.   ATORVASTATIN  (LIPITOR) 40 MG TABLET    Take 1 tablet (40 mg total) by mouth daily.   BLOOD GLUCOSE MONITORING  SUPPL (ACCU-CHEK GUIDE) W/DEVICE KIT    Use to check blood sugar 3 times daily.   BLOOD GLUCOSE MONITORING SUPPL (BLOOD GLUCOSE MONITOR SYSTEM) W/DEVICE KIT    Testing in the morning, at noon, and at bedtime.   BLOOD PRESSURE MONITORING (BLOOD PRESSURE CUFF) MISC    1 each by Does not apply route daily. Please provide Large cuff   BUSPIRONE  (BUSPAR ) 30 MG TABLET    Take 1 tablet (30 mg total) by mouth 2 (two) times daily.   CHOLECALCIFEROL  (VITAMIN D3) 10 MCG (400 UNIT) CAPS    Take 400 Units by mouth daily.   CONTINUOUS GLUCOSE SENSOR (DEXCOM G7 15 DAY SENSOR) MISC    Use as directed every 15 days   CYCLOSPORINE  (RESTASIS ) 0.05 % OPHTHALMIC EMULSION    Apply 1 drop into both eyes twice a day   DICLOFENAC  (VOLTAREN ) 50 MG EC TABLET    Take 50 mg by mouth 2 (two) times daily as needed for mild pain (pain score 1-3).   DULOXETINE  (CYMBALTA ) 60 MG CAPSULE    Take 1 capsule (60 mg total) by mouth 2 (two) times daily.   ERENUMAB -AOOE (AIMOVIG ) 140 MG/ML SOAJ    Inject 140 mg into the skin every 28 (twenty-eight) days.   EZETIMIBE  (ZETIA ) 10 MG TABLET    Take 1 tablet (10 mg total) by mouth daily.   GABAPENTIN  (NEURONTIN ) 300 MG CAPSULE    Take 1 capsule (300 mg total) by mouth 3 (three) times daily.   GLUCAGON  (BAQSIMI  ONE PACK) 3 MG/DOSE POWD    Place 1 Device into the nose as needed (Low blood sugar with impaired consciousness).   GLUCOSE BLOOD (ACCU-CHEK GUIDE) TEST STRIP    Use to check blood sugar 3 times daily   GLUCOSE BLOOD (BLOOD GLUCOSE TEST STRIPS) STRP    Testing in the morning, at noon, and at bedtime.   HYDROXYZINE  (ATARAX ) 50 MG TABLET    Take 1 tablet (50 mg total) by mouth 3 (three) times daily.   INSULIN  PEN NEEDLE 31G X 6 MM MISC    Use as directed.   INSULIN  REGULAR HUMAN CONCENTRATED (HUMULIN  R U-500 KWIKPEN) 500 UNIT/ML KWIKPEN    Inject 85-95 Units into the skin daily with breakfast AND 75-80 Units daily before lunch AND 65-70 Units daily before supper. Take 30 minutes before  meals.   LANCETS MISC    Use up to four times daily as directed. (FOR ICD-10 E10.9, E11.9).   LORATADINE  (CLARITIN ) 10 MG TABLET    Take 1 tablet (  10 mg total) by mouth daily as needed for allergies.   METOCLOPRAMIDE  (REGLAN ) 5 MG TABLET    Take 1 tablet (5 mg total) by mouth every 8 (eight) hours as needed for nausea. Dose decrease   METOPROLOL  TARTRATE (LOPRESSOR ) 25 MG TABLET    Take 1 tablet (25 mg total) by mouth 2 (two) times daily.   MULTIPLE VITAMINS-MINERALS (EYE VITAMINS PO)    Take 1 capsule by mouth in the morning and at bedtime.   OLANZAPINE -SAMIDORPHAN (LYBALVI ) 15-10 MG TABS    Take 15 mg by mouth at bedtime.   OMEPRAZOLE  (PRILOSEC) 40 MG CAPSULE    TAKE 1 CAPSULE BY MOUTH ONCE DAILY 30 MINUTES BEFORE BREAKFAST   ONDANSETRON  (ZOFRAN -ODT) 4 MG DISINTEGRATING TABLET    Take 1 tablet (4 mg total) by mouth every 8 (eight) hours as needed for nausea or vomiting.   PRESCRIPTION MEDICATION    CPAP- At bedtime and during any time of rest   SUMATRIPTAN  (IMITREX ) 100 MG TABLET    TAKE 1 TABLET EARLIEST ONSET OF MIGRAINE. MAY REPEAT IN 2 HOURS IF HEADACHE PERSISTS OR RECURS. MAXIMUM 2 TABLETS IN 24 HOURS.   TRAZODONE  (DESYREL ) 150 MG TABLET    Take 1 tablet (150 mg total) by mouth at bedtime as needed for sleep.   TYLENOL  8 HOUR ARTHRITIS PAIN 650 MG CR TABLET    Take 1,300 mg by mouth every 8 (eight) hours as needed for pain.   VITAMIN B-12 (CYANOCOBALAMIN ) 50 MCG TABLET    Take 50 mcg by mouth daily.  Modified Medications   No medications on file  Discontinued Medications   TIRZEPATIDE  (MOUNJARO ) 2.5 MG/0.5ML PEN    Inject 2.5 mg into the skin once a week.   TIRZEPATIDE  (MOUNJARO ) 2.5 MG/0.5ML PEN    Inject 2.5 mg into the skin once a week.    Allergies Allergies  Allergen Reactions   Aspirin Hives   Trulicity  [Dulaglutide ] Nausea Only and Other (See Comments)    DC'd due to chronic gastric and abdominal pain with nausea   Oxycodone  Nausea And Vomiting    Past Medical  History Past Medical History:  Diagnosis Date   Allergy    Anxiety    Arthritis    Asthma    Depression    Diabetes mellitus without complication (HCC)    GERD (gastroesophageal reflux disease)    Glaucoma suspect    Hyperlipidemia    Trichomonas infection     Past Surgical History Past Surgical History:  Procedure Laterality Date   CESAREAN SECTION     UPPER GASTROINTESTINAL ENDOSCOPY     VAGINAL HYSTERECTOMY  12/23/2004   Fibroids, menorrhagia, benign pathology    Family History family history includes Colon cancer in her paternal grandmother; Colon cancer (age of onset: 8) in her father; Depression in her mother; Diabetes in her father and mother; Hypertension in her father and mother; Mental illness in her mother.  Social History Social History   Socioeconomic History   Marital status: Significant Other    Spouse name: Not on file   Number of children: 3   Years of education: 14   Highest education level: Not on file  Occupational History   Not on file  Tobacco Use   Smoking status: Former    Current packs/day: 0.00    Average packs/day: 0.5 packs/day for 13.0 years (6.5 ttl pk-yrs)    Types: Cigarettes    Start date: 2008    Quit date: 2021    Years  since quitting: 5.1    Passive exposure: Past   Smokeless tobacco: Never  Vaping Use   Vaping status: Never Used  Substance and Sexual Activity   Alcohol use: Not Currently    Comment: occasional   Drug use: Yes    Types: Marijuana    Comment: occ   Sexual activity: Yes    Birth control/protection: Surgical  Other Topics Concern   Not on file  Social History Narrative   Patient lives at home with mother and father , one story    Patient has 3 children    Patient is single   Patient has 14 years of education    Patient is right handed    Caffeine none   Social Drivers of Health   Tobacco Use: Medium Risk (01/28/2025)   Patient History    Smoking Tobacco Use: Former    Smokeless Tobacco Use: Never     Passive Exposure: Past  Physicist, Medical Strain: High Risk (10/28/2024)   Overall Financial Resource Strain (CARDIA)    Difficulty of Paying Living Expenses: Very hard  Food Insecurity: Food Insecurity Present (12/08/2024)   Epic    Worried About Programme Researcher, Broadcasting/film/video in the Last Year: Often true    Ran Out of Food in the Last Year: Often true  Transportation Needs: No Transportation Needs (12/08/2024)   Epic    Lack of Transportation (Medical): No    Lack of Transportation (Non-Medical): No  Physical Activity: Inactive (05/11/2024)   Exercise Vital Sign    Days of Exercise per Week: 0 days    Minutes of Exercise per Session: 0 min  Stress: Stress Concern Present (05/11/2024)   [employer] of Occupational Health - Occupational Stress Questionnaire    Feeling of Stress : Very much  Social Connections: Unknown (05/11/2024)   Social Connection and Isolation Panel    Frequency of Communication with Friends and Family: More than three times a week    Frequency of Social Gatherings with Friends and Family: More than three times a week    Attends Religious Services: More than 4 times per year    Active Member of Clubs or Organizations: Yes    Attends Banker Meetings: 1 to 4 times per year    Marital Status: Patient declined  Intimate Partner Violence: Not At Risk (12/08/2024)   Epic    Fear of Current or Ex-Partner: No    Emotionally Abused: No    Physically Abused: No    Sexually Abused: No  Depression (PHQ2-9): High Risk (12/08/2024)   Depression (PHQ2-9)    PHQ-2 Score: 18  Alcohol Screen: Low Risk (05/11/2024)   Alcohol Screen    Last Alcohol Screening Score (AUDIT): 0  Housing: High Risk (12/08/2024)   Epic    Unable to Pay for Housing in the Last Year: Yes    Number of Times Moved in the Last Year: 0    Homeless in the Last Year: Yes  Utilities: At Risk (12/08/2024)   Epic    Threatened with loss of utilities: Yes  Health Literacy: Adequate Health  Literacy (05/11/2024)   B1300 Health Literacy    Frequency of need for help with medical instructions: Never    Lab Results  Component Value Date   HGBA1C 8.9 (A) 11/11/2024   HGBA1C 8.0 (A) 08/24/2024   HGBA1C 10.0 (H) 05/01/2024   Lab Results  Component Value Date   CHOL 126 12/20/2024   Lab Results  Component Value Date  HDL 42 (L) 12/20/2024   Lab Results  Component Value Date   LDLCALC 65 12/20/2024   Lab Results  Component Value Date   TRIG 107 12/20/2024   Lab Results  Component Value Date   CHOLHDL 3.0 12/20/2024   Lab Results  Component Value Date   CREATININE 0.87 12/20/2024   Lab Results  Component Value Date   GFR 78.96 10/09/2022   Lab Results  Component Value Date   MICROALBUR 15.7 12/27/2024      Component Value Date/Time   NA 145 12/20/2024 0808   NA 138 03/23/2024 0934   K 4.2 12/20/2024 0808   CL 105 12/20/2024 0808   CO2 30 12/20/2024 0808   GLUCOSE 53 (L) 12/20/2024 0808   BUN 7 12/20/2024 0808   BUN 10 03/23/2024 0934   CREATININE 0.87 12/20/2024 0808   CALCIUM  9.5 12/20/2024 0808   PROT 7.0 12/20/2024 0808   PROT 6.9 03/23/2024 0934   ALBUMIN 4.5 10/20/2024 1303   ALBUMIN 4.3 03/23/2024 0934   AST 18 12/20/2024 0808   ALT 15 12/20/2024 0808   ALKPHOS 127 (H) 10/20/2024 1303   BILITOT 0.4 12/20/2024 0808   BILITOT 0.3 03/23/2024 0934   GFRNONAA >60 10/21/2024 1224   GFRAA >60 08/10/2020 2100      Latest Ref Rng & Units 12/20/2024    8:08 AM 10/21/2024   12:24 PM 10/21/2024    7:29 AM  BMP  Glucose 65 - 99 mg/dL 53  795  833   BUN 7 - 25 mg/dL 7  12  12    Creatinine 0.50 - 1.03 mg/dL 9.12  9.04  8.97   BUN/Creat Ratio 6 - 22 (calc) SEE NOTE:     Sodium 135 - 146 mmol/L 145  140  143   Potassium 3.5 - 5.3 mmol/L 4.2  3.7  4.0   Chloride 98 - 110 mmol/L 105  104  105   CO2 20 - 32 mmol/L 30  24  28    Calcium  8.6 - 10.4 mg/dL 9.5  9.3  9.2        Component Value Date/Time   WBC 9.5 10/21/2024 0031   RBC 4.43  10/21/2024 0031   HGB 11.6 (L) 10/21/2024 0031   HGB 13.3 12/12/2022 1036   HCT 38.0 10/21/2024 0031   HCT 40.4 12/12/2022 1036   PLT 475 (H) 10/21/2024 0031   PLT 436 12/12/2022 1036   MCV 85.8 10/21/2024 0031   MCV 86 12/12/2022 1036   MCH 26.2 10/21/2024 0031   MCHC 30.5 10/21/2024 0031   RDW 15.2 10/21/2024 0031   RDW 12.9 12/12/2022 1036   LYMPHSABS 2.9 10/19/2024 0856   LYMPHSABS 2.4 07/14/2019 1058   MONOABS 0.7 10/19/2024 0856   EOSABS 0.1 10/19/2024 0856   EOSABS 0.1 07/14/2019 1058   BASOSABS 0.0 10/19/2024 0856   BASOSABS 0.0 07/14/2019 1058     Parts of this note may have been dictated using voice recognition software. There may be variances in spelling and vocabulary which are unintentional. Not all errors are proofread. Please notify the dino if any discrepancies are noted or if the meaning of any statement is not clear.   "

## 2025-02-04 ENCOUNTER — Ambulatory Visit (HOSPITAL_COMMUNITY): Admitting: Clinical

## 2025-02-10 ENCOUNTER — Telehealth (HOSPITAL_COMMUNITY): Admitting: Psychiatry

## 2025-02-15 ENCOUNTER — Encounter

## 2025-03-10 ENCOUNTER — Telehealth: Admitting: *Deleted

## 2025-05-17 ENCOUNTER — Ambulatory Visit: Payer: Self-pay | Admitting: Internal Medicine

## 2025-09-19 ENCOUNTER — Ambulatory Visit: Admitting: Neurology
